# Patient Record
Sex: Male | Born: 1945
Health system: Southern US, Community
[De-identification: ages and names within clinical notes are randomized; demographics above are authoritative.]

## PROBLEM LIST (undated history)

## (undated) DIAGNOSIS — M3212 Pericarditis in systemic lupus erythematosus: Secondary | ICD-10-CM

## (undated) DIAGNOSIS — R0602 Shortness of breath: Secondary | ICD-10-CM

## (undated) DIAGNOSIS — G44229 Chronic tension-type headache, not intractable: Secondary | ICD-10-CM

## (undated) DIAGNOSIS — M329 Systemic lupus erythematosus, unspecified: Secondary | ICD-10-CM

## (undated) DIAGNOSIS — IMO0002 Reserved for concepts with insufficient information to code with codable children: Secondary | ICD-10-CM

## (undated) DIAGNOSIS — F32A Depression, unspecified: Secondary | ICD-10-CM

## (undated) DIAGNOSIS — I499 Cardiac arrhythmia, unspecified: Secondary | ICD-10-CM

## (undated) DIAGNOSIS — E785 Hyperlipidemia, unspecified: Secondary | ICD-10-CM

## (undated) DIAGNOSIS — F419 Anxiety disorder, unspecified: Secondary | ICD-10-CM

## (undated) DIAGNOSIS — I219 Acute myocardial infarction, unspecified: Secondary | ICD-10-CM

## (undated) DIAGNOSIS — F329 Major depressive disorder, single episode, unspecified: Secondary | ICD-10-CM

## (undated) DIAGNOSIS — I10 Essential (primary) hypertension: Secondary | ICD-10-CM

## (undated) DIAGNOSIS — K635 Polyp of colon: Secondary | ICD-10-CM

## (undated) DIAGNOSIS — I2510 Atherosclerotic heart disease of native coronary artery without angina pectoris: Secondary | ICD-10-CM

## (undated) DIAGNOSIS — I509 Heart failure, unspecified: Secondary | ICD-10-CM

## (undated) DIAGNOSIS — M3213 Lung involvement in systemic lupus erythematosus: Secondary | ICD-10-CM

## (undated) HISTORY — DX: Polyp of colon: K63.5

## (undated) HISTORY — PX: CARDIAC CATHETERIZATION: SHX172

## (undated) HISTORY — DX: Anxiety disorder, unspecified: F41.9

## (undated) HISTORY — DX: Major depressive disorder, single episode, unspecified: F32.9

## (undated) HISTORY — DX: Depression, unspecified: F32.A

## (undated) HISTORY — DX: Hyperlipidemia, unspecified: E78.5

## (undated) HISTORY — DX: Chronic tension-type headache, not intractable: G44.229

## (undated) NOTE — *Deleted (*Deleted)
Medical screening examination/treatment/procedure(s) were conducted as a shared visit with non-physician practitioner(s) and myself.  I personally evaluated the patient during the encounter  

---

## 2006-06-04 DIAGNOSIS — I219 Acute myocardial infarction, unspecified: Secondary | ICD-10-CM

## 2006-06-04 HISTORY — DX: Acute myocardial infarction, unspecified: I21.9

## 2007-06-05 HISTORY — PX: OTHER SURGICAL HISTORY: SHX169

## 2013-02-03 ENCOUNTER — Inpatient Hospital Stay (HOSPITAL_COMMUNITY)
Admission: EM | Admit: 2013-02-03 | Discharge: 2013-02-05 | DRG: 314 | Disposition: A | Discharge status: Home / Self Care | Payer: Medicare Other | Attending: Internal Medicine | Admitting: Internal Medicine

## 2013-02-03 ENCOUNTER — Encounter (HOSPITAL_COMMUNITY): Payer: Self-pay | Admitting: Emergency Medicine

## 2013-02-03 ENCOUNTER — Emergency Department (HOSPITAL_COMMUNITY): Payer: Medicare Other

## 2013-02-03 DIAGNOSIS — J8410 Pulmonary fibrosis, unspecified: Secondary | ICD-10-CM | POA: Diagnosis present

## 2013-02-03 DIAGNOSIS — J849 Interstitial pulmonary disease, unspecified: Secondary | ICD-10-CM

## 2013-02-03 DIAGNOSIS — R9431 Abnormal electrocardiogram [ECG] [EKG]: Secondary | ICD-10-CM

## 2013-02-03 DIAGNOSIS — I10 Essential (primary) hypertension: Secondary | ICD-10-CM | POA: Diagnosis present

## 2013-02-03 DIAGNOSIS — A319 Mycobacterial infection, unspecified: Secondary | ICD-10-CM | POA: Diagnosis present

## 2013-02-03 DIAGNOSIS — M3213 Lung involvement in systemic lupus erythematosus: Secondary | ICD-10-CM | POA: Diagnosis present

## 2013-02-03 DIAGNOSIS — I309 Acute pericarditis, unspecified: Principal | ICD-10-CM | POA: Diagnosis present

## 2013-02-03 DIAGNOSIS — I2510 Atherosclerotic heart disease of native coronary artery without angina pectoris: Secondary | ICD-10-CM | POA: Diagnosis present

## 2013-02-03 DIAGNOSIS — R079 Chest pain, unspecified: Secondary | ICD-10-CM

## 2013-02-03 DIAGNOSIS — J189 Pneumonia, unspecified organism: Secondary | ICD-10-CM | POA: Diagnosis present

## 2013-02-03 DIAGNOSIS — Z87891 Personal history of nicotine dependence: Secondary | ICD-10-CM

## 2013-02-03 DIAGNOSIS — E785 Hyperlipidemia, unspecified: Secondary | ICD-10-CM | POA: Diagnosis present

## 2013-02-03 DIAGNOSIS — M3212 Pericarditis in systemic lupus erythematosus: Secondary | ICD-10-CM | POA: Diagnosis present

## 2013-02-03 DIAGNOSIS — I4891 Unspecified atrial fibrillation: Secondary | ICD-10-CM

## 2013-02-03 DIAGNOSIS — J99 Respiratory disorders in diseases classified elsewhere: Secondary | ICD-10-CM | POA: Diagnosis present

## 2013-02-03 DIAGNOSIS — M329 Systemic lupus erythematosus, unspecified: Secondary | ICD-10-CM | POA: Diagnosis present

## 2013-02-03 DIAGNOSIS — I252 Old myocardial infarction: Secondary | ICD-10-CM

## 2013-02-03 DIAGNOSIS — Z79899 Other long term (current) drug therapy: Secondary | ICD-10-CM

## 2013-02-03 HISTORY — DX: Reserved for concepts with insufficient information to code with codable children: IMO0002

## 2013-02-03 HISTORY — DX: Hyperlipidemia, unspecified: E78.5

## 2013-02-03 HISTORY — DX: Lung involvement in systemic lupus erythematosus: M32.13

## 2013-02-03 HISTORY — DX: Pericarditis in systemic lupus erythematosus: M32.12

## 2013-02-03 HISTORY — DX: Acute myocardial infarction, unspecified: I21.9

## 2013-02-03 HISTORY — DX: Essential (primary) hypertension: I10

## 2013-02-03 HISTORY — DX: Cardiac arrhythmia, unspecified: I49.9

## 2013-02-03 HISTORY — DX: Atherosclerotic heart disease of native coronary artery without angina pectoris: I25.10

## 2013-02-03 HISTORY — DX: Shortness of breath: R06.02

## 2013-02-03 HISTORY — DX: Systemic lupus erythematosus, unspecified: M32.9

## 2013-02-03 LAB — CBC
HCT: 44.3 % (ref 39.0–52.0)
Hemoglobin: 15.2 g/dL (ref 13.0–17.0)
MCH: 31.5 pg (ref 26.0–34.0)
MCHC: 34.3 g/dL (ref 30.0–36.0)
RBC: 4.82 MIL/uL (ref 4.22–5.81)

## 2013-02-03 LAB — BASIC METABOLIC PANEL
BUN: 9 mg/dL (ref 6–23)
CO2: 20 mEq/L (ref 19–32)
GFR calc non Af Amer: 87 mL/min — ABNORMAL LOW (ref >90)
Glucose, Bld: 109 mg/dL — ABNORMAL HIGH (ref 70–99)
Potassium: 4 mEq/L (ref 3.5–5.1)
Sodium: 141 mEq/L (ref 135–145)

## 2013-02-03 LAB — POCT I-STAT TROPONIN I: Troponin i, poc: 0.03 ng/mL (ref 0.00–0.08)

## 2013-02-03 MED ORDER — ASPIRIN 81 MG PO CHEW
324.0000 mg | CHEWABLE_TABLET | Freq: Once | ORAL | Status: AC
  Administered 2013-02-04: 324 mg via ORAL
  Filled 2013-02-03: qty 4

## 2013-02-03 MED ORDER — NITROGLYCERIN 2 % TD OINT: 1.0000 [in_us] | TOPICAL_OINTMENT | Freq: Once | TRANSDERMAL | Status: DC

## 2013-02-03 MED ORDER — NITROGLYCERIN 0.4 MG SL SUBL: 0.4000 mg | SUBLINGUAL_TABLET | SUBLINGUAL | Status: DC | PRN

## 2013-02-03 MED ORDER — DEXTROSE 5 % IV SOLN
1.0000 g | Freq: Once | INTRAVENOUS | Status: AC
  Administered 2013-02-04: 1 g via INTRAVENOUS
  Filled 2013-02-03: qty 10

## 2013-02-03 MED ORDER — DEXTROSE 5 % IV SOLN
500.0000 mg | Freq: Once | INTRAVENOUS | Status: AC
  Administered 2013-02-04: 500 mg via INTRAVENOUS
  Filled 2013-02-03: qty 500

## 2013-02-03 NOTE — ED Notes (Signed)
The pt  Has had upper lt chest pain for 3-4 days with sob with movement.  He had stents placed in feb and a heart attack 6 or 7 years ago.  No chest pain at present.  nsr on the monitor

## 2013-02-03 NOTE — ED Provider Notes (Signed)
CSN: [id number]     Arrival date & time 02/03/13  2053 History   First MD Initiated Contact with Patient 02/03/13 2218     Chief Complaint  Patient presents with  . Chest Pain   (Consider location/radiation/quality/duration/timing/severity/associated sxs/prior Treatment) Patient is a 67 y.o. male presenting with chest pain. The history is provided by the patient.  Chest Pain Pain location:  Substernal area Pain quality: aching and sharp   Pain radiates to:  Does not radiate Pain severity:  Moderate Onset quality:  Gradual Duration:  3 days Timing:  Constant Progression:  Worsening Chronicity:  New Ineffective treatments:  None tried Associated symptoms: cough and shortness of breath   Associated symptoms: no abdominal pain, no back pain, no dysphagia, no fever, no headache, no nausea, no numbness and not vomiting   Risk factors: coronary artery disease   Risk factors comment:  Lupus    Past Medical History  Diagnosis Date  . Coronary artery disease   . Lupus    Past Surgical History  Procedure Laterality Date  . Coronary stents     No family history on file. History  Substance Use Topics  . Smoking status: Never Smoker   . Smokeless tobacco: Not on file  . Alcohol Use: No    Review of Systems  Constitutional: Negative for fever and chills.  HENT: Negative for trouble swallowing, neck pain and neck stiffness.   Respiratory: Positive for cough and shortness of breath.   Cardiovascular: Positive for chest pain.  Gastrointestinal: Negative for nausea, vomiting, abdominal pain and diarrhea.  Endocrine: Negative for polydipsia and polyphagia.  Genitourinary: Negative for dysuria and frequency.  Musculoskeletal: Negative for back pain.  Skin: Negative for rash.  Neurological: Negative for numbness and headaches.  Hematological: Negative for adenopathy. Does not bruise/bleed easily.  All other systems reviewed and are negative.    Allergies  Review of patient's  allergies indicates no known allergies.  Home Medications   Current Outpatient Rx  Name  Route  Sig  Dispense  Refill  . clopidogrel (PLAVIX) 75 MG tablet   Oral   Take 75 mg by mouth daily.         [name] guaiFENesin (MUCINEX) 600 MG 12 hr tablet   Oral   Take 1,200 mg by mouth 2 (two) times daily as needed for congestion.         . hydroxychloroquine (PLAQUENIL) 200 MG tablet   Oral   Take 200 mg by mouth at bedtime.         [name] lisinopril (PRINIVIL,ZESTRIL) 2.5 MG tablet   Oral   Take 2.5 mg by mouth daily.         [name] lovastatin (MEVACOR) 20 MG tablet   Oral   Take 20 mg by mouth at bedtime.         . metoprolol tartrate (LOPRESSOR) 25 MG tablet   Oral   Take 25 mg by mouth 2 (two) times daily.          BP 97/54  Pulse 77  Temp(Src) 98.1 F (36.7 C) (Oral)  Resp 20  SpO2 94% Physical Exam  Vitals reviewed. Constitutional: He is oriented to person, place, and time. He appears well-developed and well-nourished. No distress.  HENT:  Head: Normocephalic.  Right Ear: External ear normal.  Left Ear: External ear normal.  Nose: Nose normal.  Mouth/Throat: Oropharynx is clear and moist. No oropharyngeal exudate.  Eyes: Conjunctivae and EOM are normal.  Neck: Normal range of motion.  Neck supple. No JVD present.  Cardiovascular: Normal rate, regular rhythm, normal heart sounds and intact distal pulses.  Exam reveals no gallop and no friction rub.   No murmur heard. Pulmonary/Chest: Effort normal. He has rales (in the bases).  Abdominal: Soft. Bowel sounds are normal. He exhibits no distension. There is no tenderness.  Musculoskeletal: Normal range of motion. He exhibits no edema and no tenderness.  Neurological: He is alert and oriented to person, place, and time.  Skin: Skin is warm and dry. He is not diaphoretic.  Psychiatric: He has a normal mood and affect.    ED Course  Procedures (including critical care time) Labs Review Labs Reviewed  CBC - Abnormal;  Notable for the following:    WBC 11.1 (*)    All other components within normal limits  BASIC METABOLIC PANEL - Abnormal; Notable for the following:    Glucose, Bld 109 (*)    GFR calc non Af Amer 87 (*)    All other components within normal limits  PRO B NATRIURETIC PEPTIDE - Abnormal; Notable for the following:    Pro B Natriuretic peptide (BNP) 971.3 (*)    All other components within normal limits  TROPONIN I  POCT I-STAT TROPONIN I   Imaging Review Dg Chest 2 View  02/03/2013   *RADIOLOGY REPORT*  Clinical Data: Chest pain for 3 days.  CHEST - 2 VIEW  Comparison: None.  Findings: Moderate convex right thoracic spine curvature. Midline trachea.  Mild cardiomegaly with atherosclerosis in the transverse aorta.  No right and no definite left pleural effusion. No pneumothorax.  Left greater than right patchy bibasilar airspace disease.  Concurrent mild volume loss, especially at the left lung base.  IMPRESSION: Left greater than right bibasilar airspace disease.  Especially on the left, suspicious for infection.  Atelectasis with volume loss felt less likely. Recommend radiographic follow-up until clearing.  Cardiomegaly without congestive failure.   Original Report Authenticated By: Jeronimo Greaves, M.D.    Date: 02/03/2013  Rate: 139  Rhythm: atrial fibrillation  QRS Axis: normal  Intervals: QT prolonged  ST/T Wave abnormalities: nonspecific T wave changes  Conduction Disutrbances:none  Narrative Interpretation:   Old EKG Reviewed: none available   Date: 02/03/2013  Rate: 75  Rhythm: normal sinus rhythm  QRS Axis: left  Intervals: normal  ST/T Wave abnormalities: TWI in III, aVF, V3; STE in I and aVL; STD in III and aVF  Conduction Disutrbances:none  Narrative Interpretation:   Old EKG Reviewed: NSR has replaced afib, STE and STD now present as documented      MDM   3 y M with PMH of Lupus and CAD here with chest pain x 3days that he feels is the same pain he had previosly  when he had a pericardial effusion.  He reports a recent history of being hospitalized for his lupus with pleural and pericardial effusions in February.  He finished a steroid taper approximately one month ago.  He is here with constant left sided chest pain x 3days, non-radiating, improved with certain positions.  He also reports a productive cough and sweats.  No nausea, vomiting, diarrhea, abd pain.  He was initially tachycardic, EKG with afib RVR.  On my arrival at the bedside he is in a regular rhythm.  Lungs with crackles in the bases.  No edema or JVD.  Abd soft, NT.  No murmurs.  Bedside U/S with small pericardial effusion.  CXR from triage concerning for PNA.  bnp 971.  Cr normal.  Will tx for PNA.  Pt is on plaquenil, but no other HCAP risk fx so will tx for Rocephin/Azithro.  Pt will likely need Medicine admission.  WIll repeat EKG.  11:47 PM Repeat EKG now that the patient is in NSR is concerning with STE in I and aVL and STD in III and aVF.  ST segments are concave upwards.  Trop from triage negative.  Pain is positional.  Likely pericarditis picture.  Cardiology was consulted to discuss the case and agrees that the cath lab should be activated given his EKG changes.  324 mg ASA given.  SL nitro.  Given his effusion, will not administer heparin.  2:45 AM Cath lab activation was later d/c'd.  Pt admitted to Internal Medicine for further management.    Clinical Impression: 1. Chest pain   2. Community acquired pneumonia   3. ST elevation   4. Acute pericarditis   5. Atrial fibrillation   6.  Pericardial effusion  Disposition: Admit  Condition: Fair   I have discussed the results, Dx and Tx plan. They understand and agree with plan for admission.  Exam unchanged at admission.   Pt seen in conjunction with Dr. Wilkie Aye.  Reine Just. Beverely Pace, MD Emergency Medicine PGY-III [phone]   Oleh Genin, MD 02/04/13 [phone]

## 2013-02-03 NOTE — ED Notes (Signed)
[place]  All [place] records are there

## 2013-02-03 NOTE — ED Notes (Addendum)
Pt.reports mid chest pain for several days with SOB , dry cough and diaphoresis. Pt.stated history of CAD and coronary stents his cardiologist is at [facility] .

## 2013-02-03 NOTE — ED Notes (Signed)
The pt reports that his bp jumps around.  He takes bp med and is usually on the lower side

## 2013-02-03 NOTE — ED Notes (Signed)
Ed res at the bedside 

## 2013-02-04 ENCOUNTER — Inpatient Hospital Stay (HOSPITAL_COMMUNITY): Payer: Medicare Other

## 2013-02-04 ENCOUNTER — Encounter (HOSPITAL_COMMUNITY): Payer: Self-pay | Admitting: Cardiology

## 2013-02-04 DIAGNOSIS — M3213 Lung involvement in systemic lupus erythematosus: Secondary | ICD-10-CM | POA: Diagnosis present

## 2013-02-04 DIAGNOSIS — J99 Respiratory disorders in diseases classified elsewhere: Secondary | ICD-10-CM

## 2013-02-04 DIAGNOSIS — J8410 Pulmonary fibrosis, unspecified: Secondary | ICD-10-CM

## 2013-02-04 DIAGNOSIS — M329 Systemic lupus erythematosus, unspecified: Secondary | ICD-10-CM

## 2013-02-04 DIAGNOSIS — M3212 Pericarditis in systemic lupus erythematosus: Secondary | ICD-10-CM | POA: Diagnosis present

## 2013-02-04 DIAGNOSIS — I309 Acute pericarditis, unspecified: Secondary | ICD-10-CM | POA: Diagnosis present

## 2013-02-04 DIAGNOSIS — R079 Chest pain, unspecified: Secondary | ICD-10-CM

## 2013-02-04 DIAGNOSIS — J849 Interstitial pulmonary disease, unspecified: Secondary | ICD-10-CM | POA: Diagnosis present

## 2013-02-04 DIAGNOSIS — J189 Pneumonia, unspecified organism: Secondary | ICD-10-CM | POA: Diagnosis present

## 2013-02-04 LAB — BASIC METABOLIC PANEL
CO2: 23 mEq/L (ref 19–32)
Calcium: 8.5 mg/dL (ref 8.4–10.5)
Creatinine, Ser: 0.89 mg/dL (ref 0.50–1.35)
GFR calc non Af Amer: 87 mL/min — ABNORMAL LOW (ref >90)
Glucose, Bld: 98 mg/dL (ref 70–99)
Sodium: 137 mEq/L (ref 135–145)

## 2013-02-04 LAB — URINALYSIS, ROUTINE W REFLEX MICROSCOPIC
Glucose, UA: NEGATIVE mg/dL
Protein, ur: 30 mg/dL — AB
Specific Gravity, Urine: 1.024 (ref 1.005–1.030)

## 2013-02-04 LAB — CBC
Hemoglobin: 14.1 g/dL (ref 13.0–17.0)
MCH: 31.1 pg (ref 26.0–34.0)
MCHC: 33.7 g/dL (ref 30.0–36.0)
MCV: 92.1 fL (ref 78.0–100.0)
Platelets: 257 10*3/uL (ref 150–400)
RBC: 4.54 MIL/uL (ref 4.22–5.81)

## 2013-02-04 LAB — URINE MICROSCOPIC-ADD ON

## 2013-02-04 LAB — C4 COMPLEMENT: Complement C4, Body Fluid: 22 mg/dL (ref 10–40)

## 2013-02-04 LAB — SEDIMENTATION RATE: Sed Rate: 44 mm/hr — ABNORMAL HIGH (ref 0–16)

## 2013-02-04 LAB — PRO B NATRIURETIC PEPTIDE: Pro B Natriuretic peptide (BNP): 772.3 pg/mL — ABNORMAL HIGH (ref 0–125)

## 2013-02-04 MED ORDER — HEPARIN SODIUM (PORCINE) 5000 UNIT/ML IJ SOLN: 5000.0000 [IU] | Freq: Three times a day (TID) | INTRAMUSCULAR | Status: DC

## 2013-02-04 MED ORDER — PREDNISONE 50 MG PO TABS
60.0000 mg | ORAL_TABLET | Freq: Every day | ORAL | Status: DC
  Administered 2013-02-04 – 2013-02-05 (×2): 60 mg via ORAL
  Filled 2013-02-04 (×3): qty 1

## 2013-02-04 MED ORDER — LISINOPRIL 2.5 MG PO TABS
2.5000 mg | ORAL_TABLET | Freq: Every day | ORAL | Status: DC
  Administered 2013-02-04 – 2013-02-05 (×2): 2.5 mg via ORAL
  Filled 2013-02-04 (×2): qty 1

## 2013-02-04 MED ORDER — IBUPROFEN 600 MG PO TABS
600.0000 mg | ORAL_TABLET | Freq: Three times a day (TID) | ORAL | Status: DC
  Administered 2013-02-04 – 2013-02-05 (×4): 600 mg via ORAL
  Filled 2013-02-04 (×6): qty 1

## 2013-02-04 MED ORDER — SIMVASTATIN 10 MG PO TABS
10.0000 mg | ORAL_TABLET | Freq: Every day | ORAL | Status: DC
  Administered 2013-02-04: 10 mg via ORAL
  Filled 2013-02-04 (×2): qty 1

## 2013-02-04 MED ORDER — CLOPIDOGREL BISULFATE 75 MG PO TABS
75.0000 mg | ORAL_TABLET | Freq: Every day | ORAL | Status: DC
  Administered 2013-02-04 – 2013-02-05 (×2): 75 mg via ORAL
  Filled 2013-02-04 (×2): qty 1

## 2013-02-04 MED ORDER — METOPROLOL TARTRATE 25 MG PO TABS
25.0000 mg | ORAL_TABLET | Freq: Two times a day (BID) | ORAL | Status: DC
  Administered 2013-02-04 – 2013-02-05 (×3): 25 mg via ORAL
  Filled 2013-02-04 (×4): qty 1

## 2013-02-04 MED ORDER — GUAIFENESIN ER 600 MG PO TB12
1200.0000 mg | ORAL_TABLET | Freq: Two times a day (BID) | ORAL | Status: DC | PRN
  Filled 2013-02-04: qty 2

## 2013-02-04 MED ORDER — SODIUM CHLORIDE 0.9 % IV SOLN
INTRAVENOUS | Status: DC
  Administered 2013-02-04: 04:00:00 via INTRAVENOUS

## 2013-02-04 MED ORDER — HYDROXYCHLOROQUINE SULFATE 200 MG PO TABS
200.0000 mg | ORAL_TABLET | Freq: Every day | ORAL | Status: DC
  Administered 2013-02-04: 200 mg via ORAL
  Filled 2013-02-04 (×2): qty 1

## 2013-02-04 MED ORDER — SODIUM CHLORIDE 0.9 % IJ SOLN
3.0000 mL | Freq: Two times a day (BID) | INTRAMUSCULAR | Status: DC
  Administered 2013-02-04: 3 mL via INTRAVENOUS

## 2013-02-04 NOTE — ED Provider Notes (Signed)
I saw and evaluated the patient, reviewed the resident's note and I agree with the findings and plan.  This is a 67 year old male with a history of coronary artery disease, lupus, pericarditis who presents with 3 days of chest pain. Patient just moved screened for a has not had any of his care here. Patient reports 3 days of positional chest pain. He states his chest pain is sharp and gets worse when he lays flat. It is somewhat better when he leans forward. He denies any fevers. He does have a productive cough. Patient states that he was hospitalized in February for pericarditis. At that time he describes having a pericardial effusion and a pleural effusion.    Initial EKG showed patient was in atrial fibrillation. There is no evidence of ST elevation or ischemia. Patient was going to rate as 139. He spontaneously converted. Repeat EKGs showed ST elevations in 1 and aVL depressions in the inferior leads. We have no old EKG. By the time of the second EKG, he had a negative troponin. Given his EKG changes, a Code STEMI was initiated.    Patient was evaluated by cardiology. They feel his EKG changes and presentation is consistent with pericarditis.   Patient was given Rocephin and azithromycin given his x-ray findings.  Cardiology gave recommendations regarding pericarditis. Patient will be admitted to the hospitalist service.  Shon Baton, MD 02/04/13 [phone]

## 2013-02-04 NOTE — ED Notes (Signed)
zithromax almost infused

## 2013-02-04 NOTE — Consult Note (Addendum)
Admit date: 02/03/2013 Referring Physician  Dr. Beverely Pace Primary Physician  None Primary Cardiologist  NOne Reason for Consultation  Chest pain/pericarditis  HPI: This is a 67yo AAM with a remote history of MI in 2008 at which time he got 2 stents and then repeat cath in 2009 with PCI x1, and recent hospitalization in February in [place] for acute pericarditis as a complication of lupus and PNA.  He CP at that time was sharp and was treated with steroids.  He has been in his USOH when he started have SOB, pleuritic CP and fevers and significant night sweats.  This has been going on for 3 days.  The CP has been constant since then and is identical to his CP with hie pericarditis.  The CP is not like his CAD at all.  He has also had a cough productive of clear phlegm.  He was noted in ER to be in atrial fibrillation which resolved spontaneously to NSR with ST elevation in I and aVL with PR depression and T wave inversions in III, aVF and V3-V4.  He was noted on chest xray to have airspace disease suspicious for PNA.  Cardiology was consulted to evaluate abnormal EKG and possible pericarditis.  He has had constant CP for 3 days with normal troponin.     PMH:   Past Medical History  Diagnosis Date  . Lupus   . Coronary artery disease 2008/2009    MI with PCI x 2, then PCI x 1 in 2009  . Myocardial infarction 2008  . Pericarditis   . Dyslipidemia   . Hypertension   . Dysrhythmia     atrial fibrillation     PSH:   Past Surgical History  Procedure Laterality Date  . Coronary stents    . Cardiac catheterization      Allergies:  Review of patient's allergies indicates no known allergies. Prior to Admit Meds:   (Not in a hospital admission) Fam HX:   No family history on file. Social HX:    History   Social History  . Marital Status: Married    Spouse Name: N/A    Number of Children: N/A  . Years of Education: N/A   Occupational History  . Not on file.   Social History Main Topics  .  Smoking status: Former Games developer  . Smokeless tobacco: Not on file  . Alcohol Use: No  . Drug Use: Not on file  . Sexual Activity: Not on file   Other Topics Concern  . Not on file   Social History Narrative  . No narrative on file     ROS:  All 11 ROS were addressed and are negative except what is stated in the HPI  Physical Exam: Blood pressure 110/72, pulse 72, temperature 98.1 F (36.7 C), temperature source Oral, resp. rate 18, SpO2 92.00%.    General: Well developed, well nourished, in no acute distress Head: Eyes PERRLA, No xanthomas.   Normal cephalic and atramatic  Lungs:   Clear bilaterally to auscultation and percussion. Heart:   HRRR S1 S2 Pulses are 2+ & equal. ? Faint pericardia friction rub            No carotid bruit. No JVD.  No abdominal bruits. No femoral bruits. Abdomen: Bowel sounds are positive, abdomen soft and non-tender without masses  Extremities:   No clubbing, cyanosis or edema.  DP +1 Neuro: Alert and oriented X 3. Psych:  Good affect, responds appropriately    Labs:  Lab Results  Component Value Date   WBC 11.1* 02/03/2013   HGB 15.2 02/03/2013   HCT 44.3 02/03/2013   MCV 91.9 02/03/2013   PLT 266 02/03/2013    Recent Labs Lab 02/03/13 2116  NA 141  K 4.0  CL 108  CO2 20  BUN 9  CREATININE 0.89  CALCIUM 8.7  GLUCOSE 109*   No results found for this basename: PTT   No results found for this basename: INR, PROTIME   Lab Results  Component Value Date   TROPONINI <0.30 02/03/2013         Radiology:  Dg Chest 2 View  02/03/2013   *RADIOLOGY REPORT*  Clinical Data: Chest pain for 3 days.  CHEST - 2 VIEW  Comparison: None.  Findings: Moderate convex right thoracic spine curvature. Midline trachea.  Mild cardiomegaly with atherosclerosis in the transverse aorta.  No right and no definite left pleural effusion. No pneumothorax.  Left greater than right patchy bibasilar airspace disease.  Concurrent mild volume loss, especially at the left lung  base.  IMPRESSION: Left greater than right bibasilar airspace disease.  Especially on the left, suspicious for infection.  Atelectasis with volume loss felt less likely. Recommend radiographic follow-up until clearing.  Cardiomegaly without congestive failure.   Original Report Authenticated By: Jeronimo Greaves, M.D.    EKG:  #1Atrial fibrillation with nonspecific T wave abnormality.  #2 NSR with 1mm of J point elevation in I and aVL and t wave inversions in III and aVF  ASSESSMENT:  1.  Acute pericarditis most likely secondary to acute pulmonary infection +/- lupus flare 2.  Probable PNA by chest xray and symptoms with elevated WBC 3.  Lupus 4.  CAD with remote PCI 5.  Elevated BNP but no chest xray findings of CHF - may be due to afib  PLAN:   1.  Check 2D echo in am 2.  Motrin 600mg  TID 3.  Treatment of PNA by Hospitalist service 4.  No anticoagulation for PAF since he is back in NSR and has acute pericarditis 5.  Continue cardiac meds  Quintella Reichert, MD  02/04/2013  12:28 AM

## 2013-02-04 NOTE — ED Notes (Signed)
Admitting doctor in to see 

## 2013-02-04 NOTE — Progress Notes (Signed)
  Echocardiogram 2D Echocardiogram has been performed.  Dorothey Baseman 02/04/2013, 1:28 PM

## 2013-02-04 NOTE — ED Provider Notes (Signed)
I saw and evaluated the patient, reviewed the resident's note and I agree with the findings and plan.  Please see my note.  Shon Baton, MD 02/04/13 [phone]

## 2013-02-04 NOTE — ED Notes (Signed)
The pt has finished his zithromax.  Sleeping intermittently.  Still waiting for orders or a doctor to see.  Non chest pain.

## 2013-02-04 NOTE — Progress Notes (Signed)
Admit date: 02/03/2013 Referring Physician  Dr. Beverely Pace Primary Physician  None Primary Cardiologist  NOne Reason for Consultation  Chest pain/pericarditis  HPI: This is a 67yo AAM with a remote history of MI in 2008 at which time he got 2 stents and then repeat cath in 2009 with PCI x1, and recent hospitalization in February in [place] for acute pericarditis as a complication of lupus and PNA.  He CP at that time was sharp and was treated with steroids.  He has been in his USOH when he started have SOB, pleuritic CP and fevers and significant night sweats.  This has been going on for 3 days.  The CP has been constant since then and is identical to his CP with hie pericarditis.  The CP is not like his CAD at all.  He has also had a cough productive of clear phlegm.  He was noted in ER to be in atrial fibrillation which resolved spontaneously to NSR with ST elevation in I and aVL with PR depression and T wave inversions in III, aVF and V3-V4.  He was noted on chest xray to have airspace disease suspicious for PNA.  Cardiology was consulted to evaluate abnormal EKG and possible pericarditis.  He has had constant CP for 3 days with normal troponin.  His pain is pleuretic and positional.  Not at all similar to his angina pain    PMH:   Past Medical History  Diagnosis Date  . Lupus   . Coronary artery disease 2008/2009    MI with PCI x 2, then PCI x 1 in 2009  . Myocardial infarction 2008  . Dyslipidemia   . Hypertension   . Dysrhythmia     atrial fibrillation  . Lupus pericarditis   . Lupus disease of the lung      PSH:   Past Surgical History  Procedure Laterality Date  . Coronary stents    . Cardiac catheterization      Allergies:  Review of patient's allergies indicates no known allergies. Prior to Admit Meds:   Prescriptions prior to admission  Medication Sig Dispense Refill  . clopidogrel (PLAVIX) 75 MG tablet Take 75 mg by mouth daily.      [name] guaiFENesin (MUCINEX) 600 MG 12 hr  tablet Take 1,200 mg by mouth 2 (two) times daily as needed for congestion.      . hydroxychloroquine (PLAQUENIL) 200 MG tablet Take 200 mg by mouth at bedtime.      [name] lisinopril (PRINIVIL,ZESTRIL) 2.5 MG tablet Take 2.5 mg by mouth daily.      [name] lovastatin (MEVACOR) 20 MG tablet Take 20 mg by mouth at bedtime.      . metoprolol tartrate (LOPRESSOR) 25 MG tablet Take 25 mg by mouth 2 (two) times daily.       Fam HX:   No family history on file. Social HX:    History   Social History  . Marital Status: Married    Spouse Name: N/A    Number of Children: N/A  . Years of Education: N/A   Occupational History  . Not on file.   Social History Main Topics  . Smoking status: Former Games developer  . Smokeless tobacco: Not on file  . Alcohol Use: No  . Drug Use: Not on file  . Sexual Activity: Not on file   Other Topics Concern  . Not on file   Social History Narrative  . No narrative on file      Physical  Exam: Blood pressure 130/70, pulse 70, temperature 98 F (36.7 C), temperature source Oral, resp. rate 20, height 6' (1.829 m), weight 201 lb (91.173 kg), SpO2 97.00%.    General: Well developed, well nourished, in no acute distress Head: Eyes PERRLA, No xanthomas.   Normal cephalic and atramatic  Lungs:   Clear bilaterally to auscultation and percussion. Heart:   HRRR S1 S2 Pulses are 2+ & equal. ? Faint pericardia friction rub            No carotid bruit. No JVD.  No abdominal bruits. No femoral bruits. Abdomen: Bowel sounds are positive, abdomen soft and non-tender without masses  Extremities:   No clubbing, cyanosis or edema.  DP +1 Neuro: Alert and oriented X 3. Psych:  Good affect, responds appropriately    Labs:   Lab Results  Component Value Date   WBC 9.8 02/04/2013   HGB 14.1 02/04/2013   HCT 41.8 02/04/2013   MCV 92.1 02/04/2013   PLT 257 02/04/2013     Recent Labs Lab 02/04/13 0448  NA 137  K 3.7  CL 104  CO2 23  BUN 8  CREATININE 0.89  CALCIUM 8.5  GLUCOSE  98   No results found for this basename: PTT   No results found for this basename: INR,  PROTIME   Lab Results  Component Value Date   TROPONINI <0.30 02/03/2013         Radiology:  Dg Chest 2 View  02/03/2013   *RADIOLOGY REPORT*  Clinical Data: Chest pain for 3 days.  CHEST - 2 VIEW  Comparison: None.  Findings: Moderate convex right thoracic spine curvature. Midline trachea.  Mild cardiomegaly with atherosclerosis in the transverse aorta.  No right and no definite left pleural effusion. No pneumothorax.  Left greater than right patchy bibasilar airspace disease.  Concurrent mild volume loss, especially at the left lung base.  IMPRESSION: Left greater than right bibasilar airspace disease.  Especially on the left, suspicious for infection.  Atelectasis with volume loss felt less likely. Recommend radiographic follow-up until clearing.  Cardiomegaly without congestive failure.   Original Report Authenticated By: Jeronimo Greaves, M.D.    EKG:  #1Atrial fibrillation with nonspecific T wave abnormality.  #2 NSR with 1mm of J point elevation in I and aVL and t wave inversions in III and aVF  ASSESSMENT:  1.  Acute pericarditis most likely secondary to acute pulmonary infection +/- lupus flare 2.  Probable PNA by chest xray and symptoms with elevated WBC 3.  Lupus 4.  CAD with remote PCI 5.  Elevated BNP but no chest xray findings of CHF - may be due to afib  PLAN:   1.  2D echo today 2.  Motrin 600mg  TID 3.  Treatment of PNA by Hospitalist service 4.  No anticoagulation for PAF  At this point. 5.  Continue cardiac meds  Elyn Aquas., MD  02/04/2013  7:44 AM

## 2013-02-04 NOTE — Progress Notes (Signed)
[facility] PROGRESS NOTE  Cody Arellano ZOX:[id number] DOB: Feb 24, 1946 DOA: 02/03/2013 PCP: No primary provider on file.  HPI/Subjective: Denies chest pain or SOB  Assessment/Plan:  Acute pericarditis -Likely secondary to his lupus, patient started on prednisone, Plaquenil and Motrin. -2-D echocardiogram pending, x-ray showed possible effusion less likely to be tamponade. -Cardiology is following.  Atrial fibrillation -Paroxysmal atrial fibrillation, diagnosed last February. -Patient is on Plavix, rate is controlled.   Lupus -With a suspected flare up, check ESR, CRP are high. -Started on prednisone, continued Plaquenil.  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 -Suspected pneumonia, patient started on Rocephin and azithromycin.  Code Status: Full code Family Communication: Plan discussed with the patient. Disposition Plan: Remains inpatient   Consultants:  Cardiology   PCCM  Procedures:  None  Antibiotics:  Rocephin and Azithromycin   Objective: Filed Vitals:   02/04/13 0401  BP: 130/70  Pulse: 70  Temp: 98 F (36.7 C)  Resp: 20    Intake/Output Summary (Last 24 hours) at 02/04/13 1150 Last data filed at 02/04/13 0900  Gross per 24 hour  Intake    240 ml  Output      0 ml  Net    240 ml   Filed Weights   02/04/13 0401  Weight: 91.173 kg (201 lb)    Exam: General: Alert and awake, oriented x3, not in any acute distress. HEENT: anicteric sclera, pupils reactive to light and accommodation, EOMI CVS: S1-S2 clear, no murmur rubs or gallops Chest: clear to auscultation bilaterally, no wheezing, rales or rhonchi Abdomen: soft nontender, nondistended, normal bowel sounds, no organomegaly Extremities: no cyanosis, clubbing or edema noted bilaterally Neuro: Cranial nerves II-XII intact, no focal neurological deficits  Data Reviewed: Basic Metabolic Panel:  Recent Labs Lab 02/03/13 2116 02/04/13 0448   NA 141 137  K 4.0 3.7  CL 108 104  CO2 20 23  GLUCOSE 109* 98  BUN 9 8  CREATININE 0.89 0.89  CALCIUM 8.7 8.5   Liver Function Tests: No results found for this basename: AST, ALT, ALKPHOS, BILITOT, PROT, ALBUMIN,  in the last 168 hours No results found for this basename: LIPASE, AMYLASE,  in the last 168 hours No results found for this basename: AMMONIA,  in the last 168 hours CBC:  Recent Labs Lab 02/03/13 2116 02/04/13 0400  WBC 11.1* 9.8  HGB 15.2 14.1  HCT 44.3 41.8  MCV 91.9 92.1  PLT 266 257   Cardiac Enzymes:  Recent Labs Lab 02/03/13 2340  TROPONINI <0.30   BNP (last 3 results)  Recent Labs  02/03/13 2116 02/04/13 0448  PROBNP 971.3* 772.3*   CBG: No results found for this basename: GLUCAP,  in the last 168 hours  Micro No results found for this or any previous visit (from the past 240 hour(s)).   Studies: Dg Chest 2 View  02/03/2013   *RADIOLOGY REPORT*  Clinical Data: Chest pain for 3 days.  CHEST - 2 VIEW  Comparison: None.  Findings: Moderate convex right thoracic spine curvature. Midline trachea.  Mild cardiomegaly with atherosclerosis in the transverse aorta.  No right and no definite left pleural effusion. No pneumothorax.  Left greater than right patchy bibasilar airspace disease.  Concurrent mild volume loss, especially at the left lung base.  IMPRESSION: Left greater than right bibasilar airspace disease.  Especially on the left, suspicious for infection.  Atelectasis with volume loss felt less likely. Recommend radiographic follow-up until clearing.  Cardiomegaly without congestive failure.  Original Report Authenticated By: Jeronimo Greaves, M.D.   Ct Chest Wo Contrast  02/04/2013   *RADIOLOGY REPORT*  Clinical Data: Chest pain, right greater than left.  Shortness of breath.  Possible interstitial lung disease.  CT CHEST WITHOUT CONTRAST  Technique:  Multidetector CT imaging of the chest was performed following the standard protocol without IV  contrast.  Comparison: Chest radiograph 02/03/2013.  Findings: No definite pathologically enlarged mediastinal or axillary lymph nodes.  Internal mammary lymph nodes are sub centimeter in size.  Difficult to definitively evaluate the hilar regions without IV contrast.  Coronary artery calcification.  Heart size at the upper limits of normal in size to mildly enlarged. Small pericardial effusion.  Patchy airspace consolidation is seen at the lung bases, primarily within both lower lobes.  Scattered cyst formation is seen in association.  Scattered pulmonary nodular densities measure up to 8 mm in the subpleural right middle lobe (image 34).  No definite traction bronchiectasis or honeycombing.  There may be minimal subpleural reticulation in the apical right upper lobe.  Tiny left pleural effusion.  Airway is unremarkable.  Incidental imaging of the upper abdomen shows no acute findings. No worrisome lytic or sclerotic lesions.  IMPRESSION:  1.  Bilateral lower lobe predominant air space consolidation with some areas of cystic change.  An acute infectious process is favored.  Difficult to assess for underlying interstitial lung disease. 2.  Scattered small pulmonary nodular densities, measuring up to 8 mm.  If the patient is at high risk for bronchogenic carcinoma, follow-up chest CT at 3-6 months is recommended.  If the patient is at low risk for bronchogenic carcinoma, follow-up chest CT at 6-12 months is recommended.  This recommendation follows the consensus statement: Guidelines for Management of Small Pulmonary Nodules Detected on CT Scans: A Statement from the Fleischner Society as published in Radiology 2005; 237:395-400. 3.  Tiny left pleural effusion and small pericardial effusion.   Original Report Authenticated By: Leanna Battles, M.D.    Scheduled Meds: . clopidogrel  75 mg Oral Daily  . hydroxychloroquine  200 mg Oral QHS  . ibuprofen  600 mg Oral TID  . lisinopril  2.5 mg Oral Daily  .  metoprolol tartrate  25 mg Oral BID  . predniSONE  60 mg Oral Q breakfast  . simvastatin  10 mg Oral q1800  . sodium chloride  3 mL Intravenous Q12H   Continuous Infusions: . sodium chloride 75 mL/hr at 02/04/13 0404    Active Problems:   Lupus disease of the lung   Lupus pericarditis   ILD (interstitial lung disease)   Acute pericarditis   CAP (community acquired pneumonia)    Time spent: 35 minutes    [facility]  [facility] Pager [phone] If 7PM-7AM, please contact night-coverage at [email or web address], password [facility] 02/04/2013, 11:50 AM  LOS: 1 day

## 2013-02-04 NOTE — H&P (Signed)
[facility] History and Physical  [name] ZOX:[id number] DOB: [birth year] DOA: 02/03/2013  Referring physician: ED PCP: No primary provider on file.   Chief Complaint: Chest pain  HPI: Cody Arellano is a 67 y.o. male with h/o lupus pericarditis and lupus lung disease (likely ILD not pneumonitis) both of which occurred earlier this year, who presents to the ED with c/o Chest pain and cough productive of small amount of sputum.  He states that these symptoms are almost identical to what he had earlier this year when they initially treated him for a month for PNA before ultimately lung biopsy by pulmonology made the diagnosis of lupus.  He states the pain in his chest is sharp and is worse when lying flat, somewhat better when leaning forward.  In the ED he was initially in A.Fib RVR with rate as high as 139 but spontaneously converted on his own.  Repeat EKG showed ST abnormalities in diffuse leads.  Troponin was negative (despite 3 days of chest pain).  Cardiology was consulted and feels that this likely represents acute pericarditis (either infectious from PNA or autoimmune from lupus).  Hospitalist has been asked to admit.  Review of Systems: 12 systems reviewed and otherwise negative.  Past Medical History  Diagnosis Date  . Lupus   . Coronary artery disease 2008/2009    MI with PCI x 2, then PCI x 1 in 2009  . Myocardial infarction 2008  . Dyslipidemia   . Hypertension   . Dysrhythmia     atrial fibrillation  . Lupus pericarditis   . Lupus disease of the lung    Past Surgical History  Procedure Laterality Date  . Coronary stents    . Cardiac catheterization     Social History:  reports that he has quit smoking. He does not have any smokeless tobacco history on file. He reports that he does not drink alcohol. His drug history is not on file.   No Known Allergies  No family history on file. No family history of autoimmune disease.  Prior to Admission medications    Medication Sig Start Date End Date Taking? Authorizing Provider  clopidogrel (PLAVIX) 75 MG tablet Take 75 mg by mouth daily.   Yes Historical Provider, MD  guaiFENesin (MUCINEX) 600 MG 12 hr tablet Take 1,200 mg by mouth 2 (two) times daily as needed for congestion.   Yes Historical Provider, MD  hydroxychloroquine (PLAQUENIL) 200 MG tablet Take 200 mg by mouth at bedtime.   Yes Historical Provider, MD  lisinopril (PRINIVIL,ZESTRIL) 2.5 MG tablet Take 2.5 mg by mouth daily.   Yes Historical Provider, MD  lovastatin (MEVACOR) 20 MG tablet Take 20 mg by mouth at bedtime.   Yes Historical Provider, MD  metoprolol tartrate (LOPRESSOR) 25 MG tablet Take 25 mg by mouth 2 (two) times daily.   Yes Historical Provider, MD   Physical Exam: Filed Vitals:   02/04/13 0213  BP: 106/69  Pulse: 69  Temp:   Resp: 18    General:  NAD, resting comfortably in bed Eyes: PEERLA EOMI ENT: mucous membranes moist Neck: supple w/o JVD Cardiovascular: RRR w/o MRG Respiratory: Patient has bibasilar fine inspiratory crackles with clear breath sounds during expiration. Abdomen: soft, nt, nd, bs+ Skin: no rash nor lesion Musculoskeletal: MAE, full ROM all 4 extremities Psychiatric: normal tone and affect Neurologic: AAOx3, grossly non-focal  Labs on Admission:  Basic Metabolic Panel:  Recent Labs Lab 02/03/13 2116  NA 141  K 4.0  CL 108  CO2 20  GLUCOSE 109*  BUN 9  CREATININE 0.89  CALCIUM 8.7   Liver Function Tests: No results found for this basename: AST, ALT, ALKPHOS, BILITOT, PROT, ALBUMIN,  in the last 168 hours No results found for this basename: LIPASE, AMYLASE,  in the last 168 hours No results found for this basename: AMMONIA,  in the last 168 hours CBC:  Recent Labs Lab 02/03/13 2116  WBC 11.1*  HGB 15.2  HCT 44.3  MCV 91.9  PLT 266   Cardiac Enzymes:  Recent Labs Lab 02/03/13 2340  TROPONINI <0.30    BNP (last 3 results)  Recent Labs  02/03/13 2116  PROBNP  971.3*   CBG: No results found for this basename: GLUCAP,  in the last 168 hours  Radiological Exams on Admission: Dg Chest 2 View  02/03/2013   *RADIOLOGY REPORT*  Clinical Data: Chest pain for 3 days.  CHEST - 2 VIEW  Comparison: None.  Findings: Moderate convex right thoracic spine curvature. Midline trachea.  Mild cardiomegaly with atherosclerosis in the transverse aorta.  No right and no definite left pleural effusion. No pneumothorax.  Left greater than right patchy bibasilar airspace disease.  Concurrent mild volume loss, especially at the left lung base.  IMPRESSION: Left greater than right bibasilar airspace disease.  Especially on the left, suspicious for infection.  Atelectasis with volume loss felt less likely. Recommend radiographic follow-up until clearing.  Cardiomegaly without congestive failure.   Original Report Authenticated By: Jeronimo Greaves, M.D.    EKG: Independently reviewed.  Assessment/Plan Active Problems:   Lupus disease of the lung   Lupus pericarditis   1. Pericarditis - Motrin, 2d echo and no anticoagulation for PAF. 2. Lung disease - got 1 dose of CAP coverage in ED, but I am highly suspicious that his lung disease represents ILD from lupus and not infectious PNA.  This is due to the history of the same a couple of months back with associated pericarditis at that time as well, the atypical picture on CXR, and the unusual exam findings of inspiratory crackles only.  Not ordering additional Abx at this time (as he would be covered for the first 24 hours anyhow), but have asked pulmonology to consult and give recommendations, I suspect steroids will be the mainstay of treatment in this patient.  Strongly doubt full blown lupus pneumonitis given the subacute onset and relative lack of severity (lupus pneumonitis typically presents as ARDS like picture).    Code Status: Full Code (must indicate code status--if unknown or must be presumed, indicate so) Family  Communication: No family in room (indicate person spoken with, if applicable, with phone number if by telephone) Disposition Plan: Admit to inpatient (indicate anticipated LOS)  Time spent: 70 min  Keon Pender M. [facility] Pager [phone]  If 7PM-7AM, please contact night-coverage [email or web address] Password [email or web address] 02/04/2013, 3:22 AM

## 2013-02-04 NOTE — ED Notes (Signed)
The pt has been seen by dr turner.  Now the pt is waiting for the hospitalist.  pts wife has gone home and the pt is attempting to sleep

## 2013-02-04 NOTE — Consult Note (Signed)
PULMONARY  / CRITICAL CARE MEDICINE  Name: Cody Arellano MRN: [id number] DOB: 1945-08-12    ADMISSION DATE:  02/03/2013 CONSULTATION DATE:  02/04/2013  REFERRING MD :  [facility] PRIMARY SERVICE:  TR[facility]  CHIEF COMPLAINT:  Chest pain  BRIEF PATIENT DESCRIPTION: 67 yo with CAD and Lupus admitted with chest pain, dyspnea, EKG changes consistent with pericarditis and bilateral airspace disease.  SIGNIFICANT EVENTS / STUDIES:  9/3  High resolution chest CT scan >>>  LINES / TUBES:  CULTURES:  ANTIBIOTICS: Ceftriaxone 9/3 >>> Azithromycin 9/3 >>>  HISTORY OF PRESENT ILLNESS:  67 yo with CAD and Lupus who presented to [facility] ED with chest pain, dyspnea and cough with clear phlegm for 3 days.  Chest pain is described as sharp, aggravated by deep breath and lying on either side, without significant alleviating factors and is different in quality from his angina.  Dyspnea is mainly on exertion, and he denies orthopnea, paroxysmal nocturnal dyspnea or pedal edema.  There was no purulent secretions or hemoptysis.  There was subjective fever but he never checked his temperature.  He reports nocturnal sweats.  Of note, the patient was seen with similar presentation in February of 2104, was reportedly diagnosed with lupus related lung disease by lung bx and treated with Prednisone with complete resolution of symptoms.  He was then started on Plaquenil and steroids were tapered off.   PAST MEDICAL HISTORY :  Past Medical History  Diagnosis Date  . Lupus   . Coronary artery disease 2008/2009    MI with PCI x 2, then PCI x 1 in 2009  . Myocardial infarction 2008  . Dyslipidemia   . Hypertension   . Dysrhythmia     atrial fibrillation  . Lupus pericarditis   . Lupus disease of the lung    Past Surgical History  Procedure Laterality Date  . Coronary stents    . Cardiac catheterization     Prior to Admission medications   Medication Sig Start Date End Date Taking? Authorizing Provider  clopidogrel (PLAVIX) 75  MG tablet Take 75 mg by mouth daily.   Yes Historical Provider, MD  guaiFENesin (MUCINEX) 600 MG 12 hr tablet Take 1,200 mg by mouth 2 (two) times daily as needed for congestion.   Yes Historical Provider, MD  hydroxychloroquine (PLAQUENIL) 200 MG tablet Take 200 mg by mouth at bedtime.   Yes Historical Provider, MD  lisinopril (PRINIVIL,ZESTRIL) 2.5 MG tablet Take 2.5 mg by mouth daily.   Yes Historical Provider, MD  lovastatin (MEVACOR) 20 MG tablet Take 20 mg by mouth at bedtime.   Yes Historical Provider, MD  metoprolol tartrate (LOPRESSOR) 25 MG tablet Take 25 mg by mouth 2 (two) times daily.   Yes Historical Provider, MD   No Known Allergies  FAMILY HISTORY:  No family history on file.  SOCIAL HISTORY:  reports that he has quit smoking. He does not have any smokeless tobacco history on file. He reports that he does not drink alcohol. His drug history is not on file.  REVIEW OF SYSTEMS:   Constitutional: Negative for chills, weight loss, and diaphoresis. Positive for fatigue, subjective fever and diaphoresis.  HENT: Negative for hearing loss, ear pain, nosebleeds, congestion, sore throat, neck pain, tinnitus and ear discharge.   Eyes: Negative for blurred vision, double vision, photophobia, pain, discharge and redness.  Respiratory: Negative for hemoptysis, wheezing and stridor.  Positive for dyspnea on exertion and cough productive of clear sputum. Cardiovascular: Negative for palpitations, orthopnea, claudication, leg swelling  and PND. Positive for sharp chest pain related to body position and respirations. Gastrointestinal: Negative for heartburn, nausea, vomiting, abdominal pain, diarrhea, constipation, blood in stool and melena.  Genitourinary: Negative for dysuria, urgency, frequency, hematuria and flank pain.  Musculoskeletal: Negative for myalgias, back pain, joint pain and falls.  Skin: Negative for itching and rash.  Neurological: Negative for dizziness, tingling, tremors,  sensory change, speech change, focal weakness, seizures, loss of consciousness, weakness and headaches.  Endo/Heme/Allergies: Negative for environmental allergies and polydipsia. Does not bruise/bleed easily.  SUBJECTIVE:   VITAL SIGNS: Temp:  [98 F (36.7 C)-98.1 F (36.7 C)] 98 F (36.7 C) (09/03 0401) Pulse Rate:  [64-139] 70 (09/03 0401) Resp:  [18-25] 20 (09/03 0401) BP: (90-130)/(52-72) 130/70 mmHg (09/03 0401) SpO2:  [91 %-98 %] 97 % (09/03 0401) Weight:  [91.173 kg (201 lb)] 91.173 kg (201 lb) (09/03 0401)  PHYSICAL EXAMINATION: General:  Resting comfortably, appears to be in no acute distress Neuro:  Awake, alert, cooperative with examination, non-focal HEENT:  NCAT, PERRL, moist membranes Neck:  Soft, no bruits, no lymphadenopathy Cardiovascular:  RRR, no m/r/g Lungs:  Bilateral air entry, few bibasilar rales Abdomen:  Soft, nontender, bowel sounds present Musculoskeletal:  Moves all extremities, no edema Skin:  Intact, no rash   Recent Labs Lab 02/03/13 2116  NA 141  K 4.0  CL 108  CO2 20  BUN 9  CREATININE 0.89  GLUCOSE 109*    Recent Labs Lab 02/03/13 2116  HGB 15.2  HCT 44.3  WBC 11.1*  PLT 266   Dg Chest 2 View  02/03/2013   *RADIOLOGY REPORT*  Clinical Data: Chest pain for 3 days.  CHEST - 2 VIEW  Comparison: None.  Findings: Moderate convex right thoracic spine curvature. Midline trachea.  Mild cardiomegaly with atherosclerosis in the transverse aorta.  No right and no definite left pleural effusion. No pneumothorax.  Left greater than right patchy bibasilar airspace disease.  Concurrent mild volume loss, especially at the left lung base.  IMPRESSION: Left greater than right bibasilar airspace disease.  Especially on the left, suspicious for infection.  Atelectasis with volume loss felt less likely. Recommend radiographic follow-up until clearing.  Cardiomegaly without congestive failure.   Original Report Authenticated By: Jeronimo Greaves, M.D.    ASSESSMENT / PLAN:  Lupus, suspected flare ILD, likely secondary to lupus Community acquired pneumonia, less likely Acute pericarditis, possibly effusion, less likely tamponade Paroxysmal atrial fibrillation, now resolved  -->  Please request records from February admission in [place] (specifically biopsy results and chest imaging) -->  ESR, CRP, C3, C4 -->  PCT -->  High resolution chest CT without contrast -->  Prednisone 60 mg PO daily starting AM -->  Continue Plaquenil -->  Agree with empirical Ceftriaxone / Azithromycin for now -->  Review TTE results when available -->  Supplemental oxygen, goal SpO2>=92 -->  Will follow, likely needs to be established with PCCM as outpatient upon discharge  Lonia Farber, MD Pulmonary and Critical Care Medicine [facility] Pager: [phone]  02/04/2013, 4:35 AM

## 2013-02-04 NOTE — ED Notes (Signed)
The pt still has no chest pain.  Resting comfortably with wife at the bedside

## 2013-02-05 DIAGNOSIS — I32 Pericarditis in diseases classified elsewhere: Secondary | ICD-10-CM

## 2013-02-05 DIAGNOSIS — I4891 Unspecified atrial fibrillation: Secondary | ICD-10-CM

## 2013-02-05 MED ORDER — LEVOFLOXACIN 750 MG PO TABS: 750.0000 mg | ORAL_TABLET | Freq: Every day | ORAL | Status: DC

## 2013-02-05 MED ORDER — PREDNISONE 20 MG PO TABS: 40.0000 mg | ORAL_TABLET | Freq: Every day | ORAL | Status: DC

## 2013-02-05 NOTE — Progress Notes (Signed)
PULMONARY  / CRITICAL CARE MEDICINE  Name: Cody Arellano MRN: [id number] DOB: 11/18/1945    ADMISSION DATE:  02/03/2013 CONSULTATION DATE:  02/04/2013  REFERRING MD :  [facility] PRIMARY SERVICE:  TRH  CHIEF COMPLAINT:  Chest pain  BRIEF PATIENT DESCRIPTION: 67 y/o with CAD and Lupus admitted with chest pain, dyspnea, EKG changes consistent with pericarditis and bilateral airspace disease.  SIGNIFICANT EVENTS / STUDIES:  9/03 - HR chest CT scan >>>bilateral lower infiltrates, favor infectious etiology.  Scattered pulmonary nodules up to 8 mm, small L pleural effusion, small pericardial effusion   LINES / TUBES:  CULTURES:  ANTIBIOTICS: Ceftriaxone 9/3 >>> Azithromycin 9/3 >>>  SUBJECTIVE: Pt reports improved chest pain, denies SOB  VITAL SIGNS: Temp:  [97.5 F (36.4 C)-98.2 F (36.8 C)] 97.5 F (36.4 C) (09/04 0457) Pulse Rate:  [64-103] 103 (09/04 0457) Resp:  [18] 18 (09/04 0457) BP: (108-113)/(60-67) 108/67 mmHg (09/04 0457) SpO2:  [92 %-96 %] 95 % (09/04 0457)  PHYSICAL EXAMINATION: General:  Resting comfortably, appears to be in no acute distress Neuro:  Awake, alert, cooperative with examination, non-focal HEENT:  NCAT, PERRL, moist membranes Neck:  Soft, no bruits, no lymphadenopathy Cardiovascular:  RRR, no m/r/g Lungs:  Bilateral air entry, few bibasilar rales Abdomen:  Soft, nontender, bowel sounds present Musculoskeletal:  Moves all extremities, no edema Skin:  Intact, no rash   Recent Labs Lab 02/03/13 2116 02/04/13 0448  NA 141 137  K 4.0 3.7  CL 108 104  CO2 20 23  BUN 9 8  CREATININE 0.89 0.89  GLUCOSE 109* 98    Recent Labs Lab 02/03/13 2116 02/04/13 0400  HGB 15.2 14.1  HCT 44.3 41.8  WBC 11.1* 9.8  PLT 266 257   Dg Chest 2 View  02/03/2013   *RADIOLOGY REPORT*  Clinical Data: Chest pain for 3 days.  CHEST - 2 VIEW  Comparison: None.  Findings: Moderate convex right thoracic spine curvature. Midline trachea.  Mild cardiomegaly with  atherosclerosis in the transverse aorta.  No right and no definite left pleural effusion. No pneumothorax.  Left greater than right patchy bibasilar airspace disease.  Concurrent mild volume loss, especially at the left lung base.  IMPRESSION: Left greater than right bibasilar airspace disease.  Especially on the left, suspicious for infection.  Atelectasis with volume loss felt less likely. Recommend radiographic follow-up until clearing.  Cardiomegaly without congestive failure.   Original Report Authenticated By: Jeronimo Greaves, M.D.   Ct Chest Wo Contrast  02/04/2013   *RADIOLOGY REPORT*  Clinical Data: Chest pain, right greater than left.  Shortness of breath.  Possible interstitial lung disease.  CT CHEST WITHOUT CONTRAST  Technique:  Multidetector CT imaging of the chest was performed following the standard protocol without IV contrast.  Comparison: Chest radiograph 02/03/2013.  Findings: No definite pathologically enlarged mediastinal or axillary lymph nodes.  Internal mammary lymph nodes are sub centimeter in size.  Difficult to definitively evaluate the hilar regions without IV contrast.  Coronary artery calcification.  Heart size at the upper limits of normal in size to mildly enlarged. Small pericardial effusion.  Patchy airspace consolidation is seen at the lung bases, primarily within both lower lobes.  Scattered cyst formation is seen in association.  Scattered pulmonary nodular densities measure up to 8 mm in the subpleural right middle lobe (image 34).  No definite traction bronchiectasis or honeycombing.  There may be minimal subpleural reticulation in the apical right upper lobe.  Tiny left pleural effusion.  Airway  is unremarkable.  Incidental imaging of the upper abdomen shows no acute findings. No worrisome lytic or sclerotic lesions.  IMPRESSION:  1.  Bilateral lower lobe predominant air space consolidation with some areas of cystic change.  An acute infectious process is favored.  Difficult to  assess for underlying interstitial lung disease. 2.  Scattered small pulmonary nodular densities, measuring up to 8 mm.  If the patient is at high risk for bronchogenic carcinoma, follow-up chest CT at 3-6 months is recommended.  If the patient is at low risk for bronchogenic carcinoma, follow-up chest CT at 6-12 months is recommended.  This recommendation follows the consensus statement: Guidelines for Management of Small Pulmonary Nodules Detected on CT Scans: A Statement from the Fleischner Society as published in Radiology 2005; 237:395-400. 3.  Tiny left pleural effusion and small pericardial effusion.   Original Report Authenticated By: Leanna Battles, M.D.   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 Appearance most consistent with atypical infxn, mycobacterial disease. No large scale GGI to support a lupus pneumonitis, but must consider.  Possible Community acquired pneumonia Paroxysmal atrial fibrillation - PAF, intermittent  -Please request records from February admission in [place] (specifically biopsy results and chest imaging) -Prednisone 60 mg PO daily starting AM -->leave on 40mg  QD x 3 weeks until seen by Dr. Delton Coombes outpt pulmonary 9/25 3:15pm -will likely need FOB as an outpt to assess for chronic mycobacterial disease. Would complete therapy for lupus pericarditis first, follow chest imaging to guide timing  -Continue Plaquenil -would change to PO Levaquin or Avelox for 7 day total abx coverage   -Supplemental oxygen, goal SpO2>=92 -Will follow, will see Dr Delton Coombes (arranged) as outpatient upon discharge. Also needs f/u with cards, rheumatology & PCP.  Lives in [place] for d/c home from pulmonary perspective.    Canary Brim, NP-C [facility] Pgr: [phone] or [phone] 02/05/2013, 10:37 AM  Please call if we can help  further.   Levy Pupa, MD, PhD 02/05/2013, 12:38 PM [facility] [phone] or if no answer [phone]

## 2013-02-05 NOTE — Discharge Summary (Signed)
Physician Discharge Summary  Cody Arellano NWG:[id number] DOB: May 07, 1946 DOA: 02/03/2013  PCP: No primary provider on file.  Admit date: 02/03/2013 Discharge date: 02/05/2013  Time spent: 40 minutes  Recommendations for Outpatient Follow-up:  1. Followup with Dr. Delton Coombes on 02/26/2013. 2. Followup with Dr. Elease Hashimoto in one month  Discharge Diagnoses:  Principal Problem:   Acute pericarditis Active Problems:   Lupus disease of the lung   Lupus pericarditis   ILD (interstitial lung disease)   CAP (community acquired pneumonia)   Discharge Condition: Stable  Diet recommendation: Heart healthy diet  Filed Weights   02/04/13 0401  Weight: 91.173 kg (201 lb)    History of present illness:  Cody Arellano is a 67 y.o. male with h/o lupus pericarditis and lupus lung disease (likely ILD not pneumonitis) both of which occurred earlier this year, who presents to the ED with c/o Chest pain and cough productive of small amount of sputum. He states that these symptoms are almost identical to what he had earlier this year when they initially treated him for a month for PNA before ultimately lung biopsy by pulmonology made the diagnosis of lupus. He states the pain in his chest is sharp and is worse when lying flat, somewhat better when leaning forward.  In the ED he was initially in A.Fib RVR with rate as high as 139 but spontaneously converted on his own. Repeat EKG showed ST abnormalities in diffuse leads. Troponin was negative (despite 3 days of chest pain). Cardiology was consulted and feels that this likely represents acute pericarditis (either infectious from PNA or autoimmune from lupus). Hospitalist has been asked to admit.  Hospital Course:   1. Acute pericarditis: Patient came in to the hospital with productive cough and minimal sputum production. His EKG showed evidence of acute pericarditis, 2-D echocardiogram was done and showed no pericardial effusion. Patient is on Plaquenil upon admission so  ibuprofen and prednisone was started. On discharge pulmonology recommended prednisone 40 mg till he sees pulmonary in the office. Patient will followup with cardiology in one month. Patient is chest pain-free, he does not have any complaints prior to discharge.  2. History fibrillation: Patient does have paroxysmal is a fibrillation, diagnosed last February, he was on intrafibrillation last night and converted to sinus rhythm this morning. Patient rate is controlled with metoprolol, he is on Plavix.  3. Lupus: With suspected flareup, elevated ESR and CRP. Patient is on Plaquenil started on prednisone, started initially at 60 mg daily, which was switched to 40 mg because of pulmonology recommendation. Patient will followup with pulmonary and rheumatology as outpatient.  4. Pneumonia: As mentioned above patient presented with cough, sputum production chest discomfort. Chest x-ray was done and showed interstitial lung disease likely secondary to lupus. CT scan was done and showed airspace consolidation likely infectious. Radiologist says is difficult to assess underlying interstitial lung disease. Pulmonology recommended FOB as outpatient to rule out MAC and other pathologies.  Procedures:  2-D echo: Study Conclusions  - Left ventricle: The cavity size was normal. Wall thickness was normal. Systolic function was normal. The estimated ejection fraction was in the range of 55% to 60%. Although no diagnostic regional wall motion abnormality was identified, this possibility cannot be completely excluded on the basis of this study. - Mitral valve: Calcified annulus. Mild regurgitation.  Consultations:  Dr. Delton Coombes of [facility] pulmonology.  Dr. Elease Hashimoto of [facility] cardiology  Discharge Exam: Filed Vitals:   02/05/13 0457  BP: 108/67  Pulse: 103  Temp: 97.5  F (36.4 C)  Resp: 18  General: Alert and awake, oriented x3, not in any acute distress. HEENT: anicteric sclera, pupils reactive to light  and accommodation, EOMI CVS: S1-S2 clear, no murmur rubs or gallops Chest: clear to auscultation bilaterally, no wheezing, rales or rhonchi Abdomen: soft nontender, nondistended, normal bowel sounds, no organomegaly Extremities: no cyanosis, clubbing or edema noted bilaterally Neuro: Cranial nerves II-XII intact, no focal neurological deficits  Discharge Instructions  Discharge Orders   Future Appointments Provider Department Dept Phone   02/26/2013 3:15 PM Leslye Peer, MD [facility] [phone]   Future Orders Complete By Expires   Diet - low sodium heart healthy  As directed    Increase activity slowly  As directed        Medication List         clopidogrel 75 MG tablet  Commonly known as:  PLAVIX  Take 75 mg by mouth daily.     hydroxychloroquine 200 MG tablet  Commonly known as:  PLAQUENIL  Take 200 mg by mouth at bedtime.     levofloxacin 750 MG tablet  Commonly known as:  LEVAQUIN  Take 1 tablet (750 mg total) by mouth daily.     lisinopril 2.5 MG tablet  Commonly known as:  PRINIVIL,ZESTRIL  Take 2.5 mg by mouth daily.     lovastatin 20 MG tablet  Commonly known as:  MEVACOR  Take 20 mg by mouth at bedtime.     metoprolol tartrate 25 MG tablet  Commonly known as:  LOPRESSOR  Take 25 mg by mouth 2 (two) times daily.     MUCINEX 600 MG 12 hr tablet  Generic drug:  guaiFENesin  Take 1,200 mg by mouth 2 (two) times daily as needed for congestion.     predniSONE 20 MG tablet  Commonly known as:  DELTASONE  Take 2 tablets (40 mg total) by mouth daily with breakfast.       No Known Allergies     Follow-up Information   Follow up with Leslye Peer., MD On 02/26/2013. (Appt at 3:15)    Specialty:  Pulmonary Disease   Contact information:   [address] [place] [address] [phone]       Follow up with Elyn Aquas., MD In 1 month.   Specialty:  Cardiology   Contact information:   [address] [place] [address] [phone]        The results of significant diagnostics from this hospitalization (including imaging, microbiology, ancillary and laboratory) are listed below for reference.    Significant Diagnostic Studies: Dg Chest 2 View  02/03/2013   *RADIOLOGY REPORT*  Clinical Data: Chest pain for 3 days.  CHEST - 2 VIEW  Comparison: None.  Findings: Moderate convex right thoracic spine curvature. Midline trachea.  Mild cardiomegaly with atherosclerosis in the transverse aorta.  No right and no definite left pleural effusion. No pneumothorax.  Left greater than right patchy bibasilar airspace disease.  Concurrent mild volume loss, especially at the left lung base.  IMPRESSION: Left greater than right bibasilar airspace disease.  Especially on the left, suspicious for infection.  Atelectasis with volume loss felt less likely. Recommend radiographic follow-up until clearing.  Cardiomegaly without congestive failure.   Original Report Authenticated By: Jeronimo Greaves, M.D.   Ct Chest Wo Contrast  02/04/2013   *RADIOLOGY REPORT*  Clinical Data: Chest pain, right greater than left.  Shortness of breath.  Possible interstitial lung disease.  CT CHEST WITHOUT CONTRAST  Technique:  Multidetector CT imaging of the chest was performed following the standard protocol without IV contrast.  Comparison: Chest radiograph 02/03/2013.  Findings: No definite pathologically enlarged mediastinal or axillary lymph nodes.  Internal mammary lymph nodes are sub centimeter in size.  Difficult to definitively evaluate the hilar regions without IV contrast.  Coronary artery calcification.  Heart size at the upper limits of normal in size to mildly enlarged. Small pericardial effusion.  Patchy airspace consolidation is seen at the lung bases, primarily within both lower lobes.  Scattered cyst formation is seen in association.  Scattered pulmonary nodular densities measure up to 8 mm in the subpleural right middle  lobe (image 34).  No definite traction bronchiectasis or honeycombing.  There may be minimal subpleural reticulation in the apical right upper lobe.  Tiny left pleural effusion.  Airway is unremarkable.  Incidental imaging of the upper abdomen shows no acute findings. No worrisome lytic or sclerotic lesions.  IMPRESSION:  1.  Bilateral lower lobe predominant air space consolidation with some areas of cystic change.  An acute infectious process is favored.  Difficult to assess for underlying interstitial lung disease. 2.  Scattered small pulmonary nodular densities, measuring up to 8 mm.  If the patient is at high risk for bronchogenic carcinoma, follow-up chest CT at 3-6 months is recommended.  If the patient is at low risk for bronchogenic carcinoma, follow-up chest CT at 6-12 months is recommended.  This recommendation follows the consensus statement: Guidelines for Management of Small Pulmonary Nodules Detected on CT Scans: A Statement from the Fleischner Society as published in Radiology 2005; 237:395-400. 3.  Tiny left pleural effusion and small pericardial effusion.   Original Report Authenticated By: Leanna Battles, M.D.    Microbiology: No results found for this or any previous visit (from the past 240 hour(s)).   Labs: Basic Metabolic Panel:  Recent Labs Lab 02/03/13 2116 02/04/13 0448  NA 141 137  K 4.0 3.7  CL 108 104  CO2 20 23  GLUCOSE 109* 98  BUN 9 8  CREATININE 0.89 0.89  CALCIUM 8.7 8.5   Liver Function Tests: No results found for this basename: AST, ALT, ALKPHOS, BILITOT, PROT, ALBUMIN,  in the last 168 hours No results found for this basename: LIPASE, AMYLASE,  in the last 168 hours No results found for this basename: AMMONIA,  in the last 168 hours CBC:  Recent Labs Lab 02/03/13 2116 02/04/13 0400  WBC 11.1* 9.8  HGB 15.2 14.1  HCT 44.3 41.8  MCV 91.9 92.1  PLT 266 257   Cardiac Enzymes:  Recent Labs Lab 02/03/13 2340  TROPONINI <0.30   BNP: BNP (last  3 results)  Recent Labs  02/03/13 2116 02/04/13 0448  PROBNP 971.3* 772.3*   CBG: No results found for this basename: GLUCAP,  in the last 168 hours     Signed:  Sanskriti Greenlaw A  [facility] 02/05/2013, 2:00 PM

## 2013-02-05 NOTE — Progress Notes (Signed)
Admit date: 02/03/2013 Referring Physician  Dr. Beverely Pace Primary Physician  None Primary Cardiologist  New to [facility] Reason for Consultation  Chest pain/pericarditis  HPI: This is a 67yo AAM with a remote history of MI in 2008 at which time he got 2 stents and then repeat cath in 2009 with PCI x1, and recent hospitalization in February in [place] for acute pericarditis as a complication of lupus and PNA.  He CP at that time was sharp and was treated with steroids.  He has been in his USOH when he started have SOB, pleuritic CP and fevers and significant night sweats.  This has been going on for 3 days.  The CP has been constant since then and is identical to his CP with hie pericarditis.  The CP is not like his CAD at all.  He has also had a cough productive of clear phlegm.  He was noted in ER to be in atrial fibrillation which resolved spontaneously to NSR with ST elevation in I and aVL with PR depression and T wave inversions in III, aVF and V3-V4.  He was noted on chest xray to have airspace disease suspicious for PNA.  Cardiology was consulted to evaluate abnormal EKG and possible pericarditis.  He has had constant CP for 3 days with normal troponin.  His pain is pleuretic and positional.  Not at all similar to his angina pain.  Echo shows normal LV function.  No pericardial effusion.     PMH:   Past Medical History  Diagnosis Date  . Lupus   . Coronary artery disease 2008/2009    MI with PCI x 2, then PCI x 1 in 2009  . Myocardial infarction 2008  . Dyslipidemia   . Hypertension   . Dysrhythmia     atrial fibrillation  . Lupus pericarditis   . Lupus disease of the lung   . Shortness of breath      PSH:   Past Surgical History  Procedure Laterality Date  . Coronary stents    . Cardiac catheterization      Allergies:  Review of patient's allergies indicates no known allergies. Prior to Admit Meds:   Prescriptions prior to admission  Medication Sig Dispense Refill  . clopidogrel  (PLAVIX) 75 MG tablet Take 75 mg by mouth daily.      [name] guaiFENesin (MUCINEX) 600 MG 12 hr tablet Take 1,200 mg by mouth 2 (two) times daily as needed for congestion.      . hydroxychloroquine (PLAQUENIL) 200 MG tablet Take 200 mg by mouth at bedtime.      [name] lisinopril (PRINIVIL,ZESTRIL) 2.5 MG tablet Take 2.5 mg by mouth daily.      [name] lovastatin (MEVACOR) 20 MG tablet Take 20 mg by mouth at bedtime.      . metoprolol tartrate (LOPRESSOR) 25 MG tablet Take 25 mg by mouth 2 (two) times daily.       Fam HX:   History reviewed. No pertinent family history. Social HX:    History   Social History  . Marital Status: Married    Spouse Name: N/A    Number of Children: N/A  . Years of Education: N/A   Occupational History  . Not on file.   Social History Main Topics  . Smoking status: Former Games developer  . Smokeless tobacco: Never Used     Comment: QUIT SMOKING 20 YEARS AGO  . Alcohol Use: No  . Drug Use: No  . Sexual Activity: Not on file  Other Topics Concern  . Not on file   Social History Narrative  . No narrative on file      Physical Exam: Blood pressure 108/67, pulse 103, temperature 97.5 F (36.4 C), temperature source Oral, resp. rate 18, height 6' (1.829 m), weight 201 lb (91.173 kg), SpO2 95.00%.    General: Well developed, well nourished, in no acute distress Head: Eyes PERRLA, No xanthomas.   Normal cephalic and atramatic  Lungs:   Clear bilaterally to auscultation and percussion. Heart:   HRRR S1 S2 Pulses are 2+ & equal. ? Faint pericardia friction rub            No carotid bruit. No JVD.  No abdominal bruits. No femoral bruits. Abdomen: Bowel sounds are positive, abdomen soft and non-tender without masses  Extremities:   No clubbing, cyanosis or edema.  DP +1 Neuro: Alert and oriented X 3. Psych:  Good affect, responds appropriately    Labs:   Lab Results  Component Value Date   WBC 9.8 02/04/2013   HGB 14.1 02/04/2013   HCT 41.8 02/04/2013   MCV 92.1 02/04/2013    PLT 257 02/04/2013     Recent Labs Lab 02/04/13 0448  NA 137  K 3.7  CL 104  CO2 23  BUN 8  CREATININE 0.89  CALCIUM 8.5  GLUCOSE 98   No results found for this basename: PTT   No results found for this basename: INR,  PROTIME   Lab Results  Component Value Date   TROPONINI <0.30 02/03/2013         Radiology:  Dg Chest 2 View  02/03/2013   *RADIOLOGY REPORT*  Clinical Data: Chest pain for 3 days.  CHEST - 2 VIEW  Comparison: None.  Findings: Moderate convex right thoracic spine curvature. Midline trachea.  Mild cardiomegaly with atherosclerosis in the transverse aorta.  No right and no definite left pleural effusion. No pneumothorax.  Left greater than right patchy bibasilar airspace disease.  Concurrent mild volume loss, especially at the left lung base.  IMPRESSION: Left greater than right bibasilar airspace disease.  Especially on the left, suspicious for infection.  Atelectasis with volume loss felt less likely. Recommend radiographic follow-up until clearing.  Cardiomegaly without congestive failure.   Original Report Authenticated By: Jeronimo Greaves, M.D.   Ct Chest Wo Contrast  02/04/2013   *RADIOLOGY REPORT*  Clinical Data: Chest pain, right greater than left.  Shortness of breath.  Possible interstitial lung disease.  CT CHEST WITHOUT CONTRAST  Technique:  Multidetector CT imaging of the chest was performed following the standard protocol without IV contrast.  Comparison: Chest radiograph 02/03/2013.  Findings: No definite pathologically enlarged mediastinal or axillary lymph nodes.  Internal mammary lymph nodes are sub centimeter in size.  Difficult to definitively evaluate the hilar regions without IV contrast.  Coronary artery calcification.  Heart size at the upper limits of normal in size to mildly enlarged. Small pericardial effusion.  Patchy airspace consolidation is seen at the lung bases, primarily within both lower lobes.  Scattered cyst formation is seen in association.   Scattered pulmonary nodular densities measure up to 8 mm in the subpleural right middle lobe (image 34).  No definite traction bronchiectasis or honeycombing.  There may be minimal subpleural reticulation in the apical right upper lobe.  Tiny left pleural effusion.  Airway is unremarkable.  Incidental imaging of the upper abdomen shows no acute findings. No worrisome lytic or sclerotic lesions.  IMPRESSION:  1.  Bilateral lower  lobe predominant air space consolidation with some areas of cystic change.  An acute infectious process is favored.  Difficult to assess for underlying interstitial lung disease. 2.  Scattered small pulmonary nodular densities, measuring up to 8 mm.  If the patient is at high risk for bronchogenic carcinoma, follow-up chest CT at 3-6 months is recommended.  If the patient is at low risk for bronchogenic carcinoma, follow-up chest CT at 6-12 months is recommended.  This recommendation follows the consensus statement: Guidelines for Management of Small Pulmonary Nodules Detected on CT Scans: A Statement from the Fleischner Society as published in Radiology 2005; 237:395-400. 3.  Tiny left pleural effusion and small pericardial effusion.   Original Report Authenticated By: Leanna Battles, M.D.    EKG:  #1Atrial fibrillation with nonspecific T wave abnormality.  #2 NSR with 1mm of J point elevation in I and aVL and t wave inversions in III and aVF  ASSESSMENT:  1.  Acute pericarditis most likely secondary to acute pulmonary infection +/- lupus flare 2.  Probable PNA by chest xray and symptoms with elevated WBC 3.  Lupus 4.  CAD with remote PCI 5.  Elevated BNP but no chest xray findings of CHF - may be due to afib  He is feeling much better.   This pain is clearly non-coronary.  He can be discharged at any point from my standpoint.  He needs further treatment of his Lupus.  I will be happy to see him in the office in several months ( he can see the PA if he needs to be seen sooner).    Please include my name and contact info on the DC instructions   Alvia Grove., MD, [facility] 02/05/2013, 8:17 AM Office - [phone] Pager [phone]

## 2013-02-26 ENCOUNTER — Ambulatory Visit (INDEPENDENT_AMBULATORY_CARE_PROVIDER_SITE_OTHER): Payer: Federal, State, Local not specified - PPO | Admitting: Emergency Medicine

## 2013-02-26 ENCOUNTER — Encounter: Payer: Self-pay | Admitting: Emergency Medicine

## 2013-02-26 VITALS — BP 100/60 | HR 55 | Temp 97.5°F | Ht 72.0 in | Wt 208.4 lb

## 2013-02-26 DIAGNOSIS — M329 Systemic lupus erythematosus, unspecified: Secondary | ICD-10-CM

## 2013-02-26 DIAGNOSIS — J99 Respiratory disorders in diseases classified elsewhere: Secondary | ICD-10-CM

## 2013-02-26 DIAGNOSIS — M3213 Lung involvement in systemic lupus erythematosus: Secondary | ICD-10-CM

## 2013-02-26 MED ORDER — PREDNISONE 20 MG PO TABS: 40.0000 mg | ORAL_TABLET | Freq: Every day | ORAL | Status: DC

## 2013-02-26 NOTE — Assessment & Plan Note (Signed)
-   ok to reduce pred to 20, continue plaquanil - need repeat Ct scan in mid-October - consider FOB depending on suspicion for atypical infxn  - will refer to dr Nickola Major to plan his immunosuppression.  - rov mid-October

## 2013-02-26 NOTE — Patient Instructions (Addendum)
Please reduce your prednisone to 20mg  daily until our next visit We will repeat your CT scan of the chest in mid-October.  We will arrange for you to see Dr Nickola Major with Rheumatology to discuss next steps with your medications We will get your records from [facility] Follow with Dr Delton Coombes after your CT scan in October to review

## 2013-02-26 NOTE — Progress Notes (Signed)
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 Discharged on Pred down to 40mg , plaquanil. He was treated for CAP (completed). Appearance on CT also suspicious for possible atypical infxn. He has had lung bx in [place] 07/2012. Needs local Rheumatologist. Feels better.    Past Medical History  Diagnosis Date  . Lupus   . Coronary artery disease 2008/2009    MI with PCI x 2, then PCI x 1 in 2009  . Myocardial infarction 2008  . Dyslipidemia   . Hypertension   . Dysrhythmia     atrial fibrillation  . Lupus pericarditis   . Lupus disease of the lung   . Shortness of breath      No family history on file.   History   Social History  . Marital Status: Married    Spouse Name: N/A    Number of Children: N/A  . Years of Education: N/A   Occupational History  . Not on file.   Social History Main Topics  . Smoking status: Former Smoker -- 1.00 packs/day for 20 years    Types: Cigarettes    Quit date: 02/26/1993  . Smokeless tobacco: Never Used     Comment: QUIT SMOKING 20 YEARS AGO  . Alcohol Use: No  . Drug Use: No  . Sexual Activity: Not on file   Other Topics Concern  . Not on file   Social History Narrative  . No narrative on file  Contracting with the Military, asbestos exposure.  Was in [employer] [place]. 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   No Known Allergies   Outpatient Prescriptions Prior to Visit  Medication Sig Dispense Refill  . clopidogrel (PLAVIX) 75 MG tablet Take 75 mg by mouth daily.      [name] guaiFENesin (MUCINEX) 600 MG 12 hr tablet Take 1,200 mg by mouth 2 (two) times daily as needed for congestion.      . hydroxychloroquine (PLAQUENIL) 200 MG tablet Take 200 mg by mouth at bedtime.      [name] lisinopril (PRINIVIL,ZESTRIL) 2.5 MG tablet Take 2.5 mg by mouth daily.      [name] lovastatin (MEVACOR) 20 MG tablet Take 20 mg by mouth at bedtime.      . metoprolol tartrate (LOPRESSOR) 25 MG tablet Take 25 mg by  mouth 2 (two) times daily.      . predniSONE (DELTASONE) 20 MG tablet Take 2 tablets (40 mg total) by mouth daily with breakfast.  60 tablet  0  . levofloxacin (LEVAQUIN) 750 MG tablet Take 1 tablet (750 mg total) by mouth daily.  7 tablet  0   No facility-administered medications prior to visit.   Filed Vitals:   02/26/13 1539  BP: 100/60  Pulse: 55  Temp: 97.5 F (36.4 C)  TempSrc: Oral  Height: 6' (1.829 m)  Weight: 208 lb 6.4 oz (94.53 kg)  SpO2: 96%   Gen: Pleasant, well-nourished, in no distress,  normal affect  ENT: No lesions,  mouth clear,  oropharynx clear, no postnasal drip  Neck: No JVD, no TMG, no carotid bruits  Lungs: No use of accessory muscles, no dullness to percussion, clear without rales or rhonchi  Cardiovascular: RRR, heart sounds normal, no murmur or gallops, no peripheral edema  Musculoskeletal: No deformities, no cyanosis or clubbing  Neuro: alert, non focal  Skin: Warm, no lesions or rashes   Lupus disease of the lung - ok to reduce pred to  20, continue plaquanil - need repeat Ct scan in mid-October - consider FOB depending on suspicion for atypical infxn  - will refer to dr Nickola Major to plan his immunosuppression.  - rov mid-October

## 2013-03-18 ENCOUNTER — Ambulatory Visit (INDEPENDENT_AMBULATORY_CARE_PROVIDER_SITE_OTHER)
Admission: RE | Admit: 2013-03-18 | Discharge: 2013-03-18 | Disposition: A | Discharge status: Home / Self Care | Payer: Federal, State, Local not specified - PPO | Source: Ambulatory Visit | Attending: Emergency Medicine | Admitting: Emergency Medicine

## 2013-03-18 DIAGNOSIS — J99 Respiratory disorders in diseases classified elsewhere: Secondary | ICD-10-CM

## 2013-03-18 DIAGNOSIS — M3213 Lung involvement in systemic lupus erythematosus: Secondary | ICD-10-CM

## 2013-03-18 DIAGNOSIS — M329 Systemic lupus erythematosus, unspecified: Secondary | ICD-10-CM

## 2013-04-07 ENCOUNTER — Ambulatory Visit (INDEPENDENT_AMBULATORY_CARE_PROVIDER_SITE_OTHER): Payer: Federal, State, Local not specified - PPO | Admitting: Emergency Medicine

## 2013-04-07 ENCOUNTER — Encounter: Payer: Self-pay | Admitting: Emergency Medicine

## 2013-04-07 VITALS — BP 120/70 | HR 58 | Ht 72.0 in | Wt 208.8 lb

## 2013-04-07 DIAGNOSIS — J8410 Pulmonary fibrosis, unspecified: Secondary | ICD-10-CM

## 2013-04-07 DIAGNOSIS — J849 Interstitial pulmonary disease, unspecified: Secondary | ICD-10-CM

## 2013-04-07 MED ORDER — PREDNISONE 20 MG PO TABS: 10.0000 mg | ORAL_TABLET | Freq: Every day | ORAL | Status: DC

## 2013-04-07 NOTE — Patient Instructions (Signed)
Please decrease prednisone to 10 mg daily Continue Plaquenil We will plan to repeat your CT scan of the chest in one year or sooner if you have any problems We will arrange for full pulmonary function testing Dr. Delton Coombes will discuss your medications with Dr. Nickola Major Follow with Dr Delton Coombes in 3 months or sooner if you have any problems.

## 2013-04-07 NOTE — Assessment & Plan Note (Signed)
Improvement on CT scan of the chest and also clinically. He has seen Dr. Nickola Major and plan we will hopefully be to decrease prednisone to 0 and substitute an alternative in addition to Plaquenil. Will discuss the plan with Dr. Nickola Major and notify the patient. Full PFT before next visit.

## 2013-04-07 NOTE — Progress Notes (Signed)
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 Discharged on Pred down to 40mg , plaquanil. He was treated for CAP (completed). Appearance on CT also suspicious for possible atypical infxn. He has had lung bx in [place] 07/2012. Needs local Rheumatologist. Feels better.   ROV 04/07/13 -- follow up for SLE with associated ILD and pericarditis. Last time we reduced prednisone to 20 mg daily and continued his Plaquenil. He has undergone repeat CT scan of the chest on 03/18/13, show improvement in parenchymal disease, L effusion. His breathing is improved.   Filed Vitals:   04/07/13 1338  BP: 120/70  Pulse: 58  Height: 6' (1.829 m)  Weight: 208 lb 12.8 oz (94.711 kg)  SpO2: 96%   Gen: Pleasant, well-nourished, in no distress,  normal affect  ENT: No lesions,  mouth clear,  oropharynx clear, no postnasal drip  Neck: No JVD, no TMG, no carotid bruits  Lungs: No use of accessory muscles, no dullness to percussion, clear without rales or rhonchi  Cardiovascular: RRR, heart sounds normal, no murmur or gallops, no peripheral edema  Musculoskeletal: No deformities, no cyanosis or clubbing  Neuro: alert, non focal  Skin: Warm, no lesions or rashes   ILD (interstitial lung disease) Improvement on CT scan of the chest and also clinically. He has seen Dr. Nickola Major and plan we will hopefully be to decrease prednisone to 0 and substitute an alternative in addition to Plaquenil. Will discuss the plan with Dr. Nickola Major and notify the patient. Full PFT before next visit.

## 2013-04-08 ENCOUNTER — Telehealth: Payer: Self-pay | Admitting: Emergency Medicine

## 2013-04-08 NOTE — Telephone Encounter (Signed)
Left pt a message regarding the plans for his medications to be adjusted by Dr Nickola Major. Dr Nickola Major' office is going to contact him.

## 2013-04-08 NOTE — Telephone Encounter (Signed)
Reviewed case w Dr Nickola Major. Will plan to add azathioprine to plaquanil, taper down prednisone. She will manage the immunosuppression. I will get PFT to establish baseline, plan to repeat CT scan in a year or sooner if he develops sx.

## 2013-04-09 ENCOUNTER — Encounter: Payer: Self-pay | Admitting: Cardiovascular Disease

## 2013-04-09 ENCOUNTER — Ambulatory Visit (INDEPENDENT_AMBULATORY_CARE_PROVIDER_SITE_OTHER): Payer: Federal, State, Local not specified - PPO | Admitting: Cardiovascular Disease

## 2013-04-09 VITALS — BP 141/74 | HR 52 | Ht 72.0 in | Wt 205.4 lb

## 2013-04-09 DIAGNOSIS — I309 Acute pericarditis, unspecified: Secondary | ICD-10-CM

## 2013-04-09 DIAGNOSIS — I2510 Atherosclerotic heart disease of native coronary artery without angina pectoris: Secondary | ICD-10-CM | POA: Insufficient documentation

## 2013-04-09 NOTE — Assessment & Plan Note (Signed)
Has a history of myocardial infarction and stenting in the past. I'll see him again in one year. We'll check fasting lipid profile, liver enzymes, and basic metabolic profile at his next visit. We'll also get an EKG.

## 2013-04-09 NOTE — Progress Notes (Signed)
     Cody Arellano Date of Birth  1946-02-06       [facility] Office [address]  [address] [place], [place]  [address]   [place], [place]  [address] [phone]     [phone]   Fax  [phone]    Fax [phone]  Problem List: 1. CAD - mi in 2008 2. Lupus 3. Pericarditis 4. Hypertension 5. Hyperlipidemia   History of Present Illness:    Current Outpatient Prescriptions on File Prior to Visit  Medication Sig Dispense Refill  . clopidogrel (PLAVIX) 75 MG tablet Take 75 mg by mouth daily.      . hydroxychloroquine (PLAQUENIL) 200 MG tablet Take 200 mg by mouth at bedtime.      [name] lisinopril (PRINIVIL,ZESTRIL) 2.5 MG tablet Take 2.5 mg by mouth daily.      [name] lovastatin (MEVACOR) 20 MG tablet Take 20 mg by mouth at bedtime.      . metoprolol tartrate (LOPRESSOR) 25 MG tablet Take 25 mg by mouth 2 (two) times daily.      . predniSONE (DELTASONE) 20 MG tablet Take 0.5 tablets (10 mg total) by mouth daily with breakfast.  30 tablet  5   No current facility-administered medications on file prior to visit.    No Known Allergies  Past Medical History  Diagnosis Date  . Lupus   . Coronary artery disease 2008/2009    MI with PCI x 2, then PCI x 1 in 2009  . Myocardial infarction 2008  . Dyslipidemia   . Hypertension   . Dysrhythmia     atrial fibrillation  . Lupus pericarditis   . Lupus disease of the lung   . Shortness of breath     Past Surgical History  Procedure Laterality Date  . Coronary stents    . Cardiac catheterization      History  Smoking status  . Former Smoker -- 1.00 packs/day for 20 years  . Types: Cigarettes  . Quit date: 02/26/1993  Smokeless tobacco  . Never Used    Comment: QUIT SMOKING 20 YEARS AGO    History  Alcohol Use No    No family history on file.  Reviw of Systems:  Reviewed in the HPI.  All other systems are negative.  Physical Exam: Blood pressure 141/74, pulse 52,  height 6' (1.829 m), weight 205 lb 6.4 oz (93.169 kg). General: Well developed, well nourished, in no acute distress.  Head: Normocephalic, atraumatic, sclera non-icteric, mucus membranes are moist,   Neck: Supple. Carotids are 2 + without bruits. No JVD   Lungs: Clear   Heart: RR , normal S1, s2  Abdomen: Soft, non-tender, non-distended with normal bowel sounds.  Msk:  Strength and tone are normal   Extremities: No clubbing or cyanosis. No edema.  Distal pedal pulses are 2+ and equal    Neuro: CN II - XII intact.  Alert and oriented X 3.   Psych:  Normal   ECG: Nov. 6, 2014:  Marked sinus brady at 48.  Inf mi  Assessment / Plan:

## 2013-04-09 NOTE — Assessment & Plan Note (Signed)
His lupus pericarditis seems to have resolved. He's not having any further episodes of chest pain. He also is not having any arrhythmias. We'll continue with the same medications.

## 2013-04-09 NOTE — Patient Instructions (Signed)
Your physician wants you to follow-up in: 1 year  You will receive a reminder letter in the mail two months in advance. If you don't receive a letter, please call our office to schedule the follow-up appointment.  Your physician recommends that you return for a FASTING lipid profile: 1 year   Your physician recommends that you continue on your current medications as directed. Please refer to the Current Medication list given to you today.    

## 2013-04-16 ENCOUNTER — Encounter: Payer: Federal, State, Local not specified - PPO | Admitting: Cardiovascular Disease

## 2013-05-13 ENCOUNTER — Ambulatory Visit (INDEPENDENT_AMBULATORY_CARE_PROVIDER_SITE_OTHER): Payer: Federal, State, Local not specified - PPO | Admitting: Internal Medicine

## 2013-05-13 ENCOUNTER — Encounter: Payer: Self-pay | Admitting: Gastroenterology

## 2013-05-13 ENCOUNTER — Encounter: Payer: Self-pay | Admitting: Internal Medicine

## 2013-05-13 VITALS — BP 110/70 | HR 58 | Temp 97.9°F | Ht 71.0 in | Wt 209.0 lb

## 2013-05-13 DIAGNOSIS — M329 Systemic lupus erythematosus, unspecified: Secondary | ICD-10-CM

## 2013-05-13 DIAGNOSIS — K635 Polyp of colon: Secondary | ICD-10-CM | POA: Insufficient documentation

## 2013-05-13 DIAGNOSIS — I2510 Atherosclerotic heart disease of native coronary artery without angina pectoris: Secondary | ICD-10-CM

## 2013-05-13 DIAGNOSIS — D126 Benign neoplasm of colon, unspecified: Secondary | ICD-10-CM

## 2013-05-13 DIAGNOSIS — F39 Unspecified mood [affective] disorder: Secondary | ICD-10-CM

## 2013-05-13 NOTE — Assessment & Plan Note (Signed)
This is quiet Maintained on meds for secondary prevention

## 2013-05-13 NOTE — Progress Notes (Signed)
Pre-visit discussion using our clinic review tool. No additional management support is needed unless otherwise documented below in the visit note.  

## 2013-05-13 NOTE — Assessment & Plan Note (Signed)
Mostly adjustment to move and illness Prednisone may be adding to this Not MDD No meds Should improve on its own

## 2013-05-13 NOTE — Assessment & Plan Note (Signed)
Lung and pericardial involvement Now on azathioprine and prednisone is weaning Dr Nickola Major is managing--- sees Dr Delton Coombes for the lungs (sounds clear now)

## 2013-05-13 NOTE — Assessment & Plan Note (Signed)
Due for colonoscopy Will need records from [place]

## 2013-05-13 NOTE — Progress Notes (Signed)
Subjective:    Patient ID: Cody Arellano, male    DOB: 14-Jun-1945, 67 y.o.   MRN: [id number]  HPI Moved here from near [place] in August Loves it here so far  Has SLE---recent hospitalization for lupus pericarditis Now seeing Dr Zenovia Jordan Still on prednisone but weaning  Just started on azathiprine Has lupus lung disease as well--interstitial lung disease  History of CAD Had stents in 2009 and another in 2010 Has been under control--- BP and cholesterol only as secondary prevention  History of colon polyps On 5 year recall and due now  Current Outpatient Prescriptions on File Prior to Visit  Medication Sig Dispense Refill  . clopidogrel (PLAVIX) 75 MG tablet Take 75 mg by mouth daily.      . hydroxychloroquine (PLAQUENIL) 200 MG tablet Take 200 mg by mouth at bedtime.      [name] lisinopril (PRINIVIL,ZESTRIL) 2.5 MG tablet Take 2.5 mg by mouth daily.      [name] lovastatin (MEVACOR) 20 MG tablet Take 20 mg by mouth at bedtime.      . metoprolol tartrate (LOPRESSOR) 25 MG tablet Take 25 mg by mouth 2 (two) times daily.      . predniSONE (DELTASONE) 20 MG tablet Take 0.5 tablets (10 mg total) by mouth daily with breakfast.  30 tablet  5   No current facility-administered medications on file prior to visit.    No Known Allergies  Past Medical History  Diagnosis Date  . Lupus     sees Dr Nickola Major  . Coronary artery disease 2008/2009    MI with PCI x 2, then PCI x 1 in 2009  . Myocardial infarction 2008  . Dyslipidemia   . Hypertension   . Dysrhythmia     atrial fibrillation  . Lupus pericarditis   . Lupus disease of the lung   . Shortness of breath   . Colon polyps     Past Surgical History  Procedure Laterality Date  . Coronary stents  2009  . Cardiac catheterization      Family History  Problem Relation Age of Onset  . Heart disease Mother   . Alcohol abuse Father   . Hyperlipidemia Sister   . Hypertension Sister   . Hyperlipidemia Brother   . Hypertension  Brother   . Diabetes Paternal Uncle   . Cancer Brother     ?lung cancer    History   Social History  . Marital Status: Married    Spouse Name: N/A    Number of Children: 5  . Years of Education: N/A   Occupational History  . 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 Smoking status: Former Smoker -- 1.00 packs/day for 20 years    Types: Cigarettes    Quit date: 02/26/1993  . Smokeless tobacco: Never Used     Comment: QUIT SMOKING 20 YEARS AGO  . Alcohol Use: No  . Drug Use: No  . Sexual Activity: Not on file   Other Topics Concern  . Not on file   Social History Narrative   No living will   Wife should make health care decisions   Would accept resuscitation   Not sure about tube feeds   Review of Systems  Constitutional: Positive for unexpected weight change. Negative for fatigue.       Tries to walk regularly Weight up on prednisone  HENT: Positive for hearing loss. Negative for dental problem.  Eyes: Negative for visual disturbance.       Regular exams for plaquenil  Respiratory: Positive for cough and shortness of breath.        Regular single coughs  Cardiovascular: Negative for chest pain, palpitations and leg swelling.  Gastrointestinal: Negative for nausea, vomiting, constipation and blood in stool.       Rare heartburn--- might try tums (effective)    Genitourinary: Positive for urgency.       Nocturia x 1 usually Occasional daytime urgency  Musculoskeletal: Negative for arthralgias, back pain, joint swelling and myalgias.  Skin: Negative for rash.  Allergic/Immunologic: Positive for immunocompromised state. Negative for environmental allergies.  Neurological: Negative for dizziness, syncope, weakness, light-headedness and headaches.  Hematological: Negative for adenopathy. Does not bruise/bleed easily.  Psychiatric/Behavioral: Positive for sleep disturbance and dysphoric mood. The patient is not  nervous/anxious.        Will awaken at 3AM---eventually able to get back to sleep Down feelings intermittently ----adjusting to move and illness       Objective:   Physical Exam  Constitutional: He appears well-developed and well-nourished. No distress.  HENT:  Mouth/Throat: Oropharynx is clear and moist. No oropharyngeal exudate.  Neck: Normal range of motion. Neck supple. No thyromegaly present.  Cardiovascular: Normal rate, regular rhythm and normal heart sounds.  Exam reveals no gallop and no friction rub.   No murmur heard. Pulmonary/Chest: Effort normal and breath sounds normal. No respiratory distress. He has no wheezes. He has no rales.  Abdominal: Soft. There is no tenderness.  Musculoskeletal: He exhibits no edema and no tenderness.  Lymphadenopathy:    He has no cervical adenopathy.  Skin: No rash noted.  Psychiatric: He has a normal mood and affect. His behavior is normal.          Assessment & Plan:

## 2013-05-20 ENCOUNTER — Encounter: Payer: Self-pay | Admitting: Internal Medicine

## 2013-05-20 ENCOUNTER — Telehealth: Payer: Self-pay | Admitting: Gastroenterology

## 2013-05-20 ENCOUNTER — Encounter: Payer: Self-pay | Admitting: Gastroenterology

## 2013-05-20 ENCOUNTER — Ambulatory Visit (INDEPENDENT_AMBULATORY_CARE_PROVIDER_SITE_OTHER): Payer: Federal, State, Local not specified - PPO | Admitting: Gastroenterology

## 2013-05-20 VITALS — BP 100/50 | HR 68 | Ht 72.0 in | Wt 210.0 lb

## 2013-05-20 DIAGNOSIS — Z860100 Personal history of colon polyps, unspecified: Secondary | ICD-10-CM | POA: Insufficient documentation

## 2013-05-20 MED ORDER — MOVIPREP 100 G PO SOLR: ORAL | Status: DC

## 2013-05-20 NOTE — Patient Instructions (Signed)
You have been scheduled for a colonoscopy with propofol. Please follow written instructions given to you at your visit today.  Please pick up your prep kit at the pharmacy within the next 1-3 days. If you use inhalers (even only as needed), please bring them with you on the day of your procedure. Your physician has requested that you go to www.startemmi.com and enter the access code given to you at your visit today. This web site gives a general overview about your procedure. However, you should still follow specific instructions given to you by our office regarding your preparation for the procedure.   We have sent the following medications to your pharmacy for you to pick up at your convenience: Moviprep; you were given instructions today at your office visit today                                               We are excited to introduce MyChart, a new best-in-class service that provides you online access to important information in your electronic medical record. We want to make it easier for you to view your health information - all in one secure location - when and where you need it. We expect MyChart will enhance the quality of care and service we provide.  When you register for MyChart, you can:    View your test results.    Request appointments and receive appointment reminders via email.    Request medication renewals.    View your medical history, allergies, medications and immunizations.    Communicate with your physician's office through a password-protected site.    Conveniently print information such as your medication lists.  To find out if MyChart is right for you, please talk to a member of our clinical staff today. We will gladly answer your questions about this free health and wellness tool.  If you are age 59 or older and want a member of your family to have access to your record, you must provide written consent by completing a proxy form available at our office.  Please speak to our clinical staff about guidelines regarding accounts for patients younger than age 26.  As you activate your MyChart account and need any technical assistance, please call the MyChart technical support line at [phone]) or email your question to [email or web address]. If you email your question(s), please include your name, a return phone number and the best time to reach you.  If you have non-urgent health-related questions, you can send a message to our office through MyChart at [email or web address]. If you have a medical emergency, call 911.  Thank you for using MyChart as your new health and wellness resource!   MyChart licensed from [employer],  4540-9811. Patents Pending.

## 2013-05-20 NOTE — Telephone Encounter (Signed)
I will forward to Dr Nahser to advise. 

## 2013-05-20 NOTE — Progress Notes (Signed)
05/20/2013 Cody Arellano [id number] 18-Sep-1945   HISTORY OF PRESENT ILLNESS:  Patient is a pleasant 67 year old male who presents to our office today to schedule a surveillance colonoscopy.  He recently moved here from [place] just 3 months ago.  He says that he had two colonoscopies in the past, the last one 5 years ago, and says that he had polyps on both colonoscopies and was told to have a repeat colonoscopies in 5 years from that time.  He cannot remember any of the contact information for the practice where he had his previous colonoscopies, but will try to get information for [place] so that we can get his records.  He does not have any complaints at this time.  No family history of colon cancer.  He is in Plavix for previous cardiac stent placements in 2008/2009 and sees Dr. Elease Hashimoto for his cardiologist.    Past Medical History  Diagnosis Date  . Lupus     sees Dr Nickola Major  . Coronary artery disease 2008/2009    MI with PCI x 2, then PCI x 1 in 2009  . Myocardial infarction 2008  . Dyslipidemia   . Hypertension   . Dysrhythmia     atrial fibrillation  . Lupus pericarditis   . Lupus disease of the lung   . Shortness of breath   . Colon polyps    Past Surgical History  Procedure Laterality Date  . Coronary stents  2009  . Cardiac catheterization      reports that he quit smoking about 20 years ago. His smoking use included Cigarettes. He has a 20 pack-year smoking history. He has never used smokeless tobacco. He reports that he does not drink alcohol or use illicit drugs. family history includes Alcohol abuse in his father; Cancer in his brother; Diabetes in his paternal uncle; Heart disease in his mother; Hyperlipidemia in his brother and sister; Hypertension in his brother and sister. No Known Allergies    Outpatient Encounter Prescriptions as of 05/20/2013  Medication Sig  . azaTHIOprine (IMURAN) 50 MG tablet Take 25 mg by mouth daily.  . clopidogrel (PLAVIX) 75 MG tablet Take 75  mg by mouth daily.  . hydroxychloroquine (PLAQUENIL) 200 MG tablet Take 200 mg by mouth at bedtime.  [name] lisinopril (PRINIVIL,ZESTRIL) 2.5 MG tablet Take 2.5 mg by mouth daily.  [name] lovastatin (MEVACOR) 20 MG tablet Take 20 mg by mouth at bedtime.  . metoprolol tartrate (LOPRESSOR) 25 MG tablet Take 25 mg by mouth 2 (two) times daily.  . predniSONE (DELTASONE) 20 MG tablet Take 0.5 tablets (10 mg total) by mouth daily with breakfast.     REVIEW OF SYSTEMS  : All other systems reviewed and negative except where noted in the History of Present Illness.   PHYSICAL EXAM: BP 100/50  Pulse 68  Ht 6' (1.829 m)  Wt 210 lb (95.255 kg)  BMI 28.47 kg/m2 General: Well developed black male in no acute distress Head: Normocephalic and atraumatic Eyes:  Sclerae anicteric, conjunctiva pink. Ears: Normal auditory acuity.  Lungs: Clear throughout to auscultation Heart: Regular rate and rhythm Abdomen: Soft, non-distended. No masses or hepatomegaly noted. Normal bowel sounds.  Non-tender. Rectal:  Deferred.  Will be performed at the time of colonoscopy. Musculoskeletal: Symmetrical with no gross deformities  Skin: No lesions on visible extremities Extremities: No edema  Neurological: Alert oriented x 4, grossly non-focal. Psychological:  Alert and cooperative. Normal mood and affect  ASSESSMENT AND PLAN: -Previous history of colon polyps:  Patient does not know the contact information for the facility in [place] where he had his previous colonoscopies.  He will try to get that information for [place] so that we can get his records, but in the meantime will schedule for his surveillance colonoscopy.  He says that he's had polyps on his two prior colonoscopies and was told to have a repeat procedure in 5 years from the last.  The risks, benefits, and alternatives were discussed with the patient and he consents to proceed.  The risks benefits and alternatives to a temporary hold of anti-coagulants/anti-platelets  for the procedure were discussed with the patient he consents to proceed. Obtain clearance from Dr. Elease Hashimoto.

## 2013-05-20 NOTE — Progress Notes (Signed)
Agree with initial assessment and plans. It appears, based on the available information, that surveillance colonoscopy is appropriate at this time. Issues regarding Plavix management to be addressed with his cardiologist.

## 2013-05-20 NOTE — Telephone Encounter (Signed)
  05/20/2013   RE: Cody Arellano DOB: 04-22-1946 MRN: [id number]   Dear Dr. Melburn Popper,    We have scheduled the above patient for an endoscopic procedure. Our records show that he is on anticoagulation therapy.   Please advise as to how long the patient may come off his therapy of Plavix prior to the procedure, which is scheduled for 06/03/2013.  Please fax back/ or route the completed form to Dashanti Burr/Pam at [phone].   Sincerely,    [facility] Carie Caddy. Pyrtle  M.D.

## 2013-05-24 NOTE — Telephone Encounter (Signed)
He may hold his plavix for 7 days prior to GI procedure.

## 2013-05-25 ENCOUNTER — Telehealth: Payer: Self-pay | Admitting: Gastroenterology

## 2013-05-25 NOTE — Telephone Encounter (Signed)
lvm for pt giving Dr. Harvie Bridge recommendations to hold plavix 7 days prior to procedure.

## 2013-06-01 NOTE — Telephone Encounter (Signed)
This appears to be a patient for colonoscopy with Dr. Marina Goodell on Wednesday, 12/31 I will copy him for his info.

## 2013-06-03 ENCOUNTER — Ambulatory Visit (AMBULATORY_SURGERY_CENTER): Payer: Federal, State, Local not specified - PPO | Admitting: Internal Medicine

## 2013-06-03 ENCOUNTER — Encounter: Payer: Self-pay | Admitting: Internal Medicine

## 2013-06-03 VITALS — BP 147/64 | HR 46 | Temp 96.9°F | Resp 19 | Ht 72.0 in | Wt 210.0 lb

## 2013-06-03 DIAGNOSIS — D126 Benign neoplasm of colon, unspecified: Secondary | ICD-10-CM

## 2013-06-03 DIAGNOSIS — Z86010 Personal history of colonic polyps: Secondary | ICD-10-CM

## 2013-06-03 MED ORDER — SODIUM CHLORIDE 0.9 % IV SOLN: 500.0000 mL | INTRAVENOUS | Status: DC

## 2013-06-03 NOTE — Patient Instructions (Addendum)
YOU HAD AN ENDOSCOPIC PROCEDURE TODAY AT [facility]: Refer to the procedure report that was given to you for any specific questions about what was found during the examination.  If the procedure report does not answer your questions, please call your gastroenterologist to clarify.  If you requested that your care partner not be given the details of your procedure findings, then the procedure report has been included in a sealed envelope for you to review at your convenience later.  YOU SHOULD EXPECT: Some feelings of bloating in the abdomen. Passage of more gas than usual.  Walking can help get rid of the air that was put into your GI tract during the procedure and reduce the bloating. If you had a lower endoscopy (such as a colonoscopy or flexible sigmoidoscopy) you may notice spotting of blood in your stool or on the toilet paper. If you underwent a bowel prep for your procedure, then you may not have a normal bowel movement for a few days.  DIET: Your first meal following the procedure should be a light meal and then it is ok to progress to your normal diet.  A half-sandwich or bowl of soup is an example of a good first meal.  Heavy or fried foods are harder to digest and may make you feel nauseous or bloated.  Likewise meals heavy in dairy and vegetables can cause extra gas to form and this can also increase the bloating.  Drink plenty of fluids but you should avoid alcoholic beverages for 24 hours.  ACTIVITY: Your care partner should take you home directly after the procedure.  You should plan to take it easy, moving slowly for the rest of the day.  You can resume normal activity the day after the procedure however you should NOT DRIVE or use heavy machinery for 24 hours (because of the sedation medicines used during the test).    SYMPTOMS TO REPORT IMMEDIATELY: A gastroenterologist can be reached at any hour.  During normal business hours, 8:30 AM to 5:00 PM Monday through Friday,  call [phone].  After hours and on weekends, please call the GI answering service at [phone]  Emergency number who will take a message and have the physician on call contact you.   Following lower endoscopy (colonoscopy or flexible sigmoidoscopy):  Excessive amounts of blood in the stool  Significant tenderness or worsening of abdominal pains  Swelling of the abdomen that is new, acute  Fever of 100F or higher FOLLOW UP: If any biopsies were taken you will be contacted by phone or by letter within the next 1-3 weeks.  Call your gastroenterologist if you have not heard about the biopsies in 3 weeks.  Our staff will call the home number listed on your records the next business day following your procedure to check on you and address any questions or concerns that you may have at that time regarding the information given to you following your procedure. This is a courtesy call and so if there is no answer at the home number and we have not heard from you through the emergency physician on call, we will assume that you have returned to your regular daily activities without incident.  SIGNATURES/CONFIDENTIALITY: You and/or your care partner have signed paperwork which will be entered into your electronic medical record.  These signatures attest to the fact that that the information above on your After Visit Summary has been reviewed and is understood.  Full responsibility of the confidentiality  of this discharge information lies with you and/or your care-partner.  Handouts on polyps, diverticulosis, high fiber diet Repeat colon in 5 years 2019 Resume plavix today as directed

## 2013-06-03 NOTE — Op Note (Signed)
[facility] [address] [place], [address]   COLONOSCOPY PROCEDURE REPORT  PATIENT: Cody Arellano, Cody Arellano  MR#: [id number] BIRTHDATE: 1945/12/29 , 67  yrs. old GENDER: Male ENDOSCOPIST: Roxy Cedar, MD REFERRED XB:[id number] Alphonsus Sias, M.D. PROCEDURE DATE:  06/03/2013 PROCEDURE:   Colonoscopy with snare polypectomy x 1 First Screening Colonoscopy - Avg.  risk and is 50 yrs.  old or older - No.  Prior Negative Screening - Now for repeat screening. N/A  History of Adenoma - Now for follow-up colonoscopy & has been > or = to 3 yrs.  Yes hx of adenoma.  Has been 3 or more years since last colonoscopy.  Polyps Removed Today? Yes. ASA CLASS:   Class III INDICATIONS:Patient's personal history of colon polyps.   [place] several colonoscopies w/ "polyps"; Last 5 yrs ago; told due now (no records) MEDICATIONS: MAC sedation, administered by CRNA and propofol (Diprivan) 200mg  IV DESCRIPTION OF PROCEDURE:   After the risks benefits and alternatives of the procedure were thoroughly explained, informed consent was obtained.  A digital rectal exam revealed no abnormalities of the rectum.   The LB [id number]  endoscope was introduced through the anus and advanced to the cecum, which was identified by both the appendix and ileocecal valve. No adverse events experienced.   The quality of the prep was adequate, using MoviPrep  The instrument was then slowly withdrawn as the colon was fully examined.  COLON FINDINGS: A diminutive polyp was found at the cecum.  A polypectomy was performed with a cold snare.  The resection was complete and the polyp tissue was completely retrieved.   Moderate diverticulosis was noted The finding was in the right colon.   The colon mucosa was otherwise normal.  Retroflexed views revealed internal hemorrhoids. The time to cecum=4 minutes 50 seconds. Withdrawal time=12 minutes 24 seconds.  The scope was withdrawn and the procedure  completed. COMPLICATIONS: There were no complications.  ENDOSCOPIC IMPRESSION: 1.   Diminutive polyp was found at the cecum; polypectomy was performed with a cold snare 2.   Moderate diverticulosis was noted in the right colon 3.   The colon mucosa was otherwise normal  RECOMMENDATIONS: 1. Follow up colonoscopy in 5 years   eSigned:  Roxy Cedar, MD 06/03/2013 9:05 AM   cc: Karie Schwalbe, MD and The Patient

## 2013-06-03 NOTE — Progress Notes (Signed)
Called to room to assist during endoscopic procedure.  Patient ID and intended procedure confirmed with present staff. Received instructions for my participation in the procedure from the performing physician.  

## 2013-06-03 NOTE — Progress Notes (Signed)
Patient did not experience any of the following events: a burn prior to discharge; a fall within the facility; wrong site/side/patient/procedure/implant event; or a hospital transfer or hospital admission upon discharge from the facility. (G8907) Patient did not have preoperative order for IV antibiotic SSI prophylaxis. (G8918)  

## 2013-06-05 ENCOUNTER — Telehealth: Payer: Self-pay

## 2013-06-05 NOTE — Telephone Encounter (Signed)
  Follow up Call-  Call back number 06/03/2013  Post procedure Call Back phone  # [phone]  Permission to leave phone message Yes     Patient questions:  Do you have a fever, pain , or abdominal swelling? no Pain Score  0 *  Have you tolerated food without any problems? yes  Have you been able to return to your normal activities? yes  Do you have any questions about your discharge instructions: Diet   no Medications  no Follow up visit  no  Do you have questions or concerns about your Care? no  Actions: * If pain score is 4 or above: No action needed, pain <4.

## 2013-06-08 ENCOUNTER — Encounter: Payer: Self-pay | Admitting: Internal Medicine

## 2013-12-28 ENCOUNTER — Telehealth: Payer: Self-pay

## 2013-12-28 DIAGNOSIS — H9193 Unspecified hearing loss, bilateral: Secondary | ICD-10-CM

## 2013-12-28 NOTE — Telephone Encounter (Signed)
Pt [place] hearing test and [place] in [place] prior to moving to [place] in 2014 that pt needed hearing aides. Pt [place] hearing [place] before [place]. Pt is continuing to have decreased hearing and would like referral to hearing specialist.pt request cb.

## 2014-02-02 ENCOUNTER — Telehealth: Payer: Self-pay | Admitting: Cardiovascular Disease

## 2014-02-02 NOTE — Telephone Encounter (Signed)
Spoke with patient who states he would like to schedule a stress test.  Patient states he has hx of CAD and has noticed increased SOB with activity recently.  Patient denies chest pain; states in previous heart attack he also did not have chest pain.  Patient states when he walks up a flight of stairs quickly it takes him a few minutes to catch his breath and after that he feels okay. States from past history he knows it is time to be evaluated for blockages and would like to have a stress test to verify.   I advised patient that he will need to be evaluated by Dr. Acie Fredrickson before ordering a stress test.  I scheduled patient for 9/8 and advised that if symptoms worsen that he should call 911 for immediate assistance.  Patient verbalized understanding and agreement.

## 2014-02-02 NOTE — Telephone Encounter (Signed)
New message          Pt is inquiring about a stress test / is this ok to schedule?

## 2014-02-09 ENCOUNTER — Encounter: Payer: Self-pay | Admitting: Cardiovascular Disease

## 2014-02-09 ENCOUNTER — Ambulatory Visit (INDEPENDENT_AMBULATORY_CARE_PROVIDER_SITE_OTHER): Payer: Federal, State, Local not specified - PPO | Admitting: Cardiovascular Disease

## 2014-02-09 VITALS — BP 100/68 | HR 52 | Ht 72.0 in | Wt 198.6 lb

## 2014-02-09 DIAGNOSIS — I2510 Atherosclerotic heart disease of native coronary artery without angina pectoris: Secondary | ICD-10-CM

## 2014-02-09 DIAGNOSIS — R0609 Other forms of dyspnea: Secondary | ICD-10-CM

## 2014-02-09 DIAGNOSIS — I25119 Atherosclerotic heart disease of native coronary artery with unspecified angina pectoris: Secondary | ICD-10-CM

## 2014-02-09 DIAGNOSIS — I32 Pericarditis in diseases classified elsewhere: Secondary | ICD-10-CM

## 2014-02-09 DIAGNOSIS — M329 Systemic lupus erythematosus, unspecified: Secondary | ICD-10-CM

## 2014-02-09 DIAGNOSIS — M3212 Pericarditis in systemic lupus erythematosus: Secondary | ICD-10-CM

## 2014-02-09 DIAGNOSIS — R0989 Other specified symptoms and signs involving the circulatory and respiratory systems: Secondary | ICD-10-CM

## 2014-02-09 DIAGNOSIS — I209 Angina pectoris, unspecified: Secondary | ICD-10-CM

## 2014-02-09 NOTE — Progress Notes (Signed)
Cody Arellano Date of Birth  1946/03/19       [facility] Office [address], [address], [place], [place]  [address]   [place], [place]  [address] [phone]     [phone]   Fax  [phone]    Fax [phone]  Problem List: 1. CAD - mi in 2008 2. Lupus 3. Pericarditis 4. Hypertension 5. Hyperlipidemia   History of Present Illness:  Sept. 8, 2015:  Cody Arellano was seen in Nov. 2014 for follow up of his Lupus pericarditis.  \ Cody Arellano has had significant worsening of DOE since I last saw him.   He has not had to take any nitroglycerin.   He does think the symptoms are very similar to when he had his MI.  He denies any PND orthopnea. He denies any pedal edema. He tries to avoid salt but it makes it he eats some salt on occasion.      Current Outpatient Prescriptions on File Prior to Visit  Medication Sig Dispense Refill  . azaTHIOprine (IMURAN) 50 MG tablet Take 25 mg by mouth daily.      . clopidogrel (PLAVIX) 75 MG tablet Take 75 mg by mouth daily.      . hydroxychloroquine (PLAQUENIL) 200 MG tablet Take 200 mg by mouth at bedtime.      Cody Arellano lisinopril (PRINIVIL,ZESTRIL) 2.5 MG tablet Take 2.5 mg by mouth daily.      Cody Arellano lovastatin (MEVACOR) 20 MG tablet Take 20 mg by mouth at bedtime.      . metoprolol tartrate (LOPRESSOR) 25 MG tablet Take 25 mg by mouth 2 (two) times daily.       No current facility-administered medications on file prior to visit.    No Known Allergies  Past Medical History  Diagnosis Date  . Lupus     sees Dr Trudie Reed  . Coronary artery disease 2008/2009    MI with PCI x 2, then PCI x 1 in 2009  . Myocardial infarction 2008  . Dyslipidemia   . Hypertension   . Dysrhythmia     atrial fibrillation  . Lupus pericarditis   . Lupus disease of the lung   . Shortness of breath   . Colon polyps     Past Surgical History  Procedure Laterality Date  . Coronary stents  2009  . Cardiac  catheterization      History  Smoking status  . Former Smoker -- 1.00 packs/day for 20 years  . Types: Cigarettes  . Quit date: 02/26/1993  Smokeless tobacco  . Never Used    Comment: QUIT SMOKING 20 YEARS AGO    History  Alcohol Use No    Family History  Problem Relation Age of Onset  . Heart disease Mother   . Alcohol abuse Father   . Hyperlipidemia Sister   . Hypertension Sister   . Hyperlipidemia Brother   . Hypertension Brother   . Diabetes Paternal Uncle   . Cancer Brother     ?lung cancer    Reviw of Systems:  Reviewed in the HPI.  All other systems are negative.  Physical Exam: Blood pressure 100/68, pulse 52, height 6' (1.829 m), weight 198 lb 9.6 oz (90.084 kg). General: Well developed, well nourished, in no acute distress.  Head: Normocephalic, atraumatic, sclera non-icteric, mucus membranes are moist,   Neck: Supple. Carotids are 2 + without bruits. No JVD   Lungs: Clear  Heart: RR , normal S1, s2  Abdomen: Soft, non-tender, non-distended with normal bowel sounds.  Msk:  Strength and tone are normal   Extremities: No clubbing or cyanosis. No edema.  Distal pedal pulses are 2+ and equal    Neuro: CN II - XII intact.  Alert and oriented X 3.   Psych:  Normal   ECG: 02/09/2014:  Sinus bradycardia at 52. He is possible left atrial enlargement. He has nonspecific T-wave abnormality.  Assessment / Plan:

## 2014-02-09 NOTE — Patient Instructions (Signed)
Your physician has requested that you have an echocardiogram. Echocardiography is a painless test that uses sound waves to create images of your heart. It provides your doctor with information about the size and shape of your heart and how well your heart's chambers and valves are working. This procedure takes approximately one hour. There are no restrictions for this procedure.  Your physician has requested that you have a lexiscan myoview. For further information please visit [email or web address]. Please follow instruction sheet, as given.  Your physician wants you to follow-up in 6 months with Dr. Acie Fredrickson. You will receive a reminder letter in the mail two months in advance. If you don't receive a letter, please call our office to schedule the follow-up appointment.  Your physician recommends that you continue on your current medications as directed. Please refer to the Current Medication list given to you today.

## 2014-02-09 NOTE — Assessment & Plan Note (Signed)
Number has a history of CAD and has had an MI in the past. Some of his current symptoms are very similar to his symptoms that he had prior to his MI in 2008. We will continue with his same medications. We'll check a [employer]

## 2014-02-09 NOTE — Assessment & Plan Note (Signed)
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  He is having significant DOE and we will get a repeat echo.

## 2014-02-16 ENCOUNTER — Ambulatory Visit (INDEPENDENT_AMBULATORY_CARE_PROVIDER_SITE_OTHER): Payer: Federal, State, Local not specified - PPO | Admitting: Podiatry

## 2014-02-16 ENCOUNTER — Encounter: Payer: Self-pay | Admitting: Podiatry

## 2014-02-16 VITALS — BP 137/69 | HR 51 | Resp 16 | Ht 72.0 in | Wt 195.0 lb

## 2014-02-16 DIAGNOSIS — M79676 Pain in unspecified toe(s): Secondary | ICD-10-CM

## 2014-02-16 DIAGNOSIS — B351 Tinea unguium: Secondary | ICD-10-CM

## 2014-02-16 DIAGNOSIS — M79609 Pain in unspecified limb: Secondary | ICD-10-CM

## 2014-02-16 MED ORDER — TAVABOROLE 5 % EX SOLN: 1.0000 [drp] | CUTANEOUS | Status: DC

## 2014-02-16 NOTE — Progress Notes (Signed)
   Subjective:    Patient ID: Daelyn Mozer, male    DOB: 01-10-1946, 68 y.o.   MRN: [id number]  HPI Comments: Mr. Wiler, 68 year old male presents to the office today for complaints of nail fungus. He states he previously saw a podiatrist in [place] and he had nail fungus and only 1 nail. At that time he did not undergo any treatment. Since then he states that he has had fungus on all of his nails and has been getting worse. Also states that his left second nail was very elongated and got caught on something and injured approximately one week ago. Nails are painful particularly in shoe gear as they are thickened and elongated. He is inquiring today about treatment options for nail fungus. No other complaints at this time.     Review of Systems  Constitutional: Positive for fatigue.  HENT: Positive for hearing loss.   Respiratory: Positive for cough.   All other systems reviewed and are negative.      Objective:   Physical Exam AAO x3, NAD DP/PT pulses palpable. CRT < 3sec. Pedal hair present Protective sensation intact the Semmes Weinstein monofilament, vibratory sensation intact, Achilles tendon reflex intact. Nails hypertrophic, dystrophic, elongated, yellow brownish discoloration, brittle x10. Left second digit nail with evidence of subungual hematoma on the proximal aspect of the nail however the nail appears to be firmly adhered to the nail bed. There is no surrounding erythema or drainage. No tenderness on palpation. MMT 5/5, ROM WNL No leg pain, swelling, warmth.     Assessment & Plan:  68 year old male with symptomatic onychomycosis. -Conservative versus surgical treatment discussed including alternatives, risks, complications. -Nail sharply debrided P79 without complications. -Discussed various treatment options for nail fungus. At this time the patient elects to proceed with topical therapy. He is unsure if he wants to proceed with a prescription topical versus over-the-counter  topical. I prescribed him [place] Mon and a prescription was sent to [facility] and he was given a conference information to followup of the pharmacy. If patient does not to go with this medication he can purchase an over-the-counter fungi nail. Side effects and medications discussed with the patient and directed to stop immediately if any are to occur and call the office. -Followup as needed. Call the office with any questions or concerns or change in symptoms. -Followup with PCP for other issues mentioned in the review of systems.

## 2014-02-16 NOTE — Patient Instructions (Signed)
Onychomycosis/Fungal Toenails  WHAT IS IT? An infection that lies within the keratin of your nail plate that is caused by a fungus.  WHY ME? Fungal infections affect all ages, sexes, races, and creeds.  There may be many factors that predispose you to a fungal infection such as age, coexisting medical conditions such as diabetes, or an autoimmune disease; stress, medications, fatigue, genetics, etc.  Bottom line: fungus thrives in a warm, moist environment and your shoes offer such a location.  IS IT CONTAGIOUS? Theoretically, yes.  You do not want to share shoes, nail clippers or files with someone who has fungal toenails.  Walking around barefoot in the same room or sleeping in the same bed is unlikely to transfer the organism.  It is important to realize, however, that fungus can spread easily from one nail to the next on the same foot.  HOW DO WE TREAT THIS?  There are several ways to treat this condition.  Treatment may depend on many factors such as age, medications, pregnancy, liver and kidney conditions, etc.  It is best to ask your doctor which options are available to you.  1. No treatment.   Unlike many other medical concerns, you can live with this condition.  However for many people this can be a painful condition and may lead to ingrown toenails or a bacterial infection.  It is recommended that you keep the nails cut short to help reduce the amount of fungal nail. 2. Topical treatment.  These range from herbal remedies to prescription strength nail lacquers.  About 40-50% effective, topicals require twice daily application for approximately 9 to 12 months or until an entirely new nail has grown out.  The most effective topicals are medical grade medications available through physicians offices. 3. Oral antifungal medications.  With an 80-90% cure rate, the most common oral medication requires 3 to 4 months of therapy and stays in your system for a year as the new nail grows out.  Oral  antifungal medications do require blood work to make sure it is a safe drug for you.  A liver function panel will be performed prior to starting the medication and after the first month of treatment.  It is important to have the blood work performed to avoid any harmful side effects.  In general, this medication safe but blood work is required. 4. Laser Therapy.  This treatment is performed by applying a specialized laser to the affected nail plate.  This therapy is noninvasive, fast, and non-painful.  It is not covered by insurance and is therefore, out of pocket.  The results have been very good with a 80-95% cure rate.  [facility] is the only practice in the area to offer this therapy. 5. Permanent Nail Avulsion.  Removing the entire nail so that a new nail will not grow back.   Over the counter treatment for fungi-nail

## 2014-02-17 ENCOUNTER — Ambulatory Visit (HOSPITAL_BASED_OUTPATIENT_CLINIC_OR_DEPARTMENT_OTHER): Payer: Federal, State, Local not specified - PPO | Admitting: Radiology

## 2014-02-17 ENCOUNTER — Ambulatory Visit (HOSPITAL_COMMUNITY): Payer: Federal, State, Local not specified - PPO | Attending: Cardiology | Admitting: Radiology

## 2014-02-17 VITALS — BP 146/89 | HR 53 | Ht 72.0 in | Wt 194.0 lb

## 2014-02-17 DIAGNOSIS — E785 Hyperlipidemia, unspecified: Secondary | ICD-10-CM | POA: Diagnosis not present

## 2014-02-17 DIAGNOSIS — I2510 Atherosclerotic heart disease of native coronary artery without angina pectoris: Secondary | ICD-10-CM | POA: Diagnosis not present

## 2014-02-17 DIAGNOSIS — I4891 Unspecified atrial fibrillation: Secondary | ICD-10-CM | POA: Insufficient documentation

## 2014-02-17 DIAGNOSIS — R072 Precordial pain: Secondary | ICD-10-CM | POA: Insufficient documentation

## 2014-02-17 DIAGNOSIS — I25119 Atherosclerotic heart disease of native coronary artery with unspecified angina pectoris: Secondary | ICD-10-CM

## 2014-02-17 DIAGNOSIS — M3212 Pericarditis in systemic lupus erythematosus: Secondary | ICD-10-CM

## 2014-02-17 DIAGNOSIS — R0609 Other forms of dyspnea: Secondary | ICD-10-CM

## 2014-02-17 DIAGNOSIS — R0989 Other specified symptoms and signs involving the circulatory and respiratory systems: Secondary | ICD-10-CM

## 2014-02-17 DIAGNOSIS — I10 Essential (primary) hypertension: Secondary | ICD-10-CM | POA: Diagnosis not present

## 2014-02-17 MED ORDER — REGADENOSON 0.4 MG/5ML IV SOLN
0.4000 mg | Freq: Once | INTRAVENOUS | Status: AC
  Administered 2014-02-17: 0.4 mg via INTRAVENOUS

## 2014-02-17 MED ORDER — TECHNETIUM TC 99M SESTAMIBI GENERIC - CARDIOLITE
11.0000 | Freq: Once | INTRAVENOUS | Status: AC | PRN
  Administered 2014-02-17: 11 via INTRAVENOUS

## 2014-02-17 MED ORDER — TECHNETIUM TC 99M SESTAMIBI GENERIC - CARDIOLITE
33.0000 | Freq: Once | INTRAVENOUS | Status: AC | PRN
Start: 2014-02-17 — End: 2014-02-17
  Administered 2014-02-17: 33 via INTRAVENOUS

## 2014-02-17 NOTE — Progress Notes (Signed)
[facility] [address] [place], [place] [address] [phone]    Cardiology Nuclear Med Study  Cody Arellano is a 68 y.o. male     MRN : [id number]     DOB: 1945/10/13  Procedure Date: 02/17/2014  Nuclear Med Background Indication for Stress Test:  Evaluation for Ischemia and Follow up CAD History:  CAD, MPI (normal per pt.), Afib Cardiac Risk Factors: Hypertension  Symptoms:  DOE   Nuclear Pre-Procedure Caffeine/Decaff Intake:  None NPO After: 7:00pm   Lungs:  clear O2 Sat: 95% on room air. IV 0.9% NS with Angio Cath:  22g  IV Site: R Hand  IV Started by:  Crissie Figures, RN  Chest Size (in):  44 Cup Size: n/a  Height: 6' (1.829 m)  Weight:  194 lb (87.998 kg)  BMI:  Body mass index is 26.31 kg/(m^2). Tech Comments:  N/A    Nuclear Med Study 1 or 2 day study: 1 day  Stress Test Type:  Lexiscan  Reading MD: N/A  Order Authorizing Provider:  Mertie Moores, MD  Resting Radionuclide: Technetium 15m Sestamibi  Resting Radionuclide Dose: 11.0 mCi   Stress Radionuclide:  Technetium 43m Sestamibi  Stress Radionuclide Dose: 33.0 mCi           Stress Protocol Rest HR: 53 Stress HR: 88  Rest BP: 146/89 Stress BP: 101/74  Exercise Time (min): n/a METS: n/a           Dose of Adenosine (mg):  n/a Dose of Lexiscan: 0.4 mg  Dose of Atropine (mg): n/a Dose of Dobutamine: n/a mcg/kg/min (at max HR)  Stress Test Technologist: Glade Lloyd, BS-ES  Nuclear Technologist:  Annye Rusk, CNMT     Rest Procedure:  Myocardial perfusion imaging was performed at rest 45 minutes following the intravenous administration of Technetium 23m Sestamibi. Rest ECG: NSR - Normal EKG  Stress Procedure:  The patient received IV Lexiscan 0.4 mg over 15-seconds with concurrent low level exercise and then Technetium 26m Sestamibi was injected at 30-seconds while the patient continued walking one more minute.  Quantitative spect images were obtained after a 45-minute delay.   During the infusion of Lexiscan the patient complained of nausea, lightheadedness and SOB.  These symptoms began to resolve in recovery.  Stress ECG: No significant change from baseline ECG  QPS Raw Data Images:  Normal; no motion artifact; normal heart/lung ratio. Stress Images:  Normal homogeneous uptake in all areas of the myocardium. Rest Images:  Normal homogeneous uptake in all areas of the myocardium. Subtraction (SDS):  There is no evidence of scar or ischemia. Transient Ischemic Dilatation (Normal <1.22):  0.89 Lung/Heart Ratio (Normal <0.45):  0.29  Quantitative Gated Spect Images QGS EDV:  90 ml QGS ESV:  39 ml  Impression Exercise Capacity:  Lexiscan with low level exercise. BP Response:  Normal blood pressure response. Clinical Symptoms:  Nausea, dyspnea.  ECG Impression:  No significant ST segment change suggestive of ischemia. Comparison with Prior Nuclear Study: No images to compare  Overall Impression:  Normal stress nuclear study.  LV Ejection Fraction: 56%.  LV Wall Motion:  NL LV Function; NL Wall Motion  Cody Arellano 02/17/2014

## 2014-02-17 NOTE — Progress Notes (Signed)
Echocardiogram performed.  

## 2014-02-18 ENCOUNTER — Telehealth: Payer: Self-pay | Admitting: Nurse Practitioner

## 2014-02-18 NOTE — Telephone Encounter (Signed)
Reviewed Myocardial Perfusion and ECHO results with patient who verbalized understanding.  Patient asked me to ask Dr. Acie Fredrickson if he can come off of some of his medications.  Patient is currently taking Plavix, Lovastatin, Metoprolol, and Lisinopril.  Patient states BP is in the normal range and he would like to take less medication if possible.  I advised patient I will send message to Dr. Acie Fredrickson for advice and will call him back with his reply.

## 2014-02-19 NOTE — Telephone Encounter (Signed)
The pt is advised and he verbalized understanding. The pt states that he will continue to monitor his BP and he will call us if he has any questions or concerns. Lisinopril has been removed from his med list.

## 2014-02-19 NOTE — Telephone Encounter (Signed)
His BP was low/normal. He may DC the lisinopril. He should continue to monitor his BP

## 2014-05-04 ENCOUNTER — Telehealth: Payer: Self-pay | Admitting: Cardiovascular Disease

## 2014-05-04 NOTE — Telephone Encounter (Signed)
New Message  Pt called requests a call back to discuss an alteration in his medications.. He wants to stop taking some of the medications he has been taking for 5 years or so. Please call.

## 2014-05-05 NOTE — Telephone Encounter (Signed)
Spoke with patient who would like Dr. Acie Fredrickson to consider taking him off of some of his medications.  Patient states he has been on the medications since his MI in 2008; states he never had hypertension, was just placed on Lisinopril and Metoprolol for protection and for heart rate control.  I advised patient that there is a phone message from September where patient was advised that he could stop Lisinopril per Dr. Acie Fredrickson.  Patient states he forgot and has continued to take Lisinopril.  I advised patient that he can go ahead and stop and continue to monitor BP.  Patient states he gets his BP checked about once a week at the pharmacy and it has remained below 120/80.  I advised patient that Dr. Acie Fredrickson may want to talk to him at next office visit before making any additional changes and I scheduled patient for follow-up March 8.  Patient verbalized understanding and agreement.

## 2014-05-06 NOTE — Telephone Encounter (Signed)
Agree with note form Christen Bame, RN

## 2014-05-11 ENCOUNTER — Ambulatory Visit (INDEPENDENT_AMBULATORY_CARE_PROVIDER_SITE_OTHER): Payer: Federal, State, Local not specified - PPO | Admitting: Internal Medicine

## 2014-05-11 ENCOUNTER — Encounter: Payer: Self-pay | Admitting: Internal Medicine

## 2014-05-11 VITALS — BP 124/68 | HR 70 | Temp 98.2°F | Wt 192.0 lb

## 2014-05-11 DIAGNOSIS — J069 Acute upper respiratory infection, unspecified: Secondary | ICD-10-CM

## 2014-05-11 MED ORDER — HYDROCODONE-HOMATROPINE 5-1.5 MG/5ML PO SYRP: 5.0000 mL | ORAL_SOLUTION | Freq: Three times a day (TID) | ORAL | Status: DC | PRN

## 2014-05-11 MED ORDER — AZITHROMYCIN 250 MG PO TABS: ORAL_TABLET | ORAL | Status: DC

## 2014-05-11 NOTE — Progress Notes (Signed)
Pre visit review using our clinic review tool, if applicable. No additional management support is needed unless otherwise documented below in the visit note. 

## 2014-05-11 NOTE — Patient Instructions (Signed)
Upper Respiratory Infection, Adult An upper respiratory infection (URI) is also sometimes known as the common cold. The upper respiratory tract includes the nose, sinuses, throat, trachea, and bronchi. Bronchi are the airways leading to the lungs. Most people improve within 1 week, but symptoms can last up to 2 weeks. A residual cough may last even longer.  CAUSES Many different viruses can infect the tissues lining the upper respiratory tract. The tissues become irritated and inflamed and often become very moist. Mucus production is also common. A cold is contagious. You can easily spread the virus to others by oral contact. This includes kissing, sharing a glass, coughing, or sneezing. Touching your mouth or nose and then touching a surface, which is then touched by another person, can also spread the virus. SYMPTOMS  Symptoms typically develop 1 to 3 days after you come in contact with a cold virus. Symptoms vary from person to person. They may include:  Runny nose.  Sneezing.  Nasal congestion.  Sinus irritation.  Sore throat.  Loss of voice (laryngitis).  Cough.  Fatigue.  Muscle aches.  Loss of appetite.  Headache.  Low-grade fever. DIAGNOSIS  You might diagnose your own cold based on familiar symptoms, since most people get a cold 2 to 3 times a year. Your caregiver can confirm this based on your exam. Most importantly, your caregiver can check that your symptoms are not due to another disease such as strep throat, sinusitis, pneumonia, asthma, or epiglottitis. Blood tests, throat tests, and X-rays are not necessary to diagnose a common cold, but they may sometimes be helpful in excluding other more serious diseases. Your caregiver will decide if any further tests are required. RISKS AND COMPLICATIONS  You may be at risk for a more severe case of the common cold if you smoke cigarettes, have chronic heart disease (such as heart failure) or lung disease (such as asthma), or if  you have a weakened immune system. The very young and very old are also at risk for more serious infections. Bacterial sinusitis, middle ear infections, and bacterial pneumonia can complicate the common cold. The common cold can worsen asthma and chronic obstructive pulmonary disease (COPD). Sometimes, these complications can require emergency medical care and may be life-threatening. PREVENTION  The best way to protect against getting a cold is to practice good hygiene. Avoid oral or hand contact with people with cold symptoms. Wash your hands often if contact occurs. There is no clear evidence that vitamin C, vitamin E, echinacea, or exercise reduces the chance of developing a cold. However, it is always recommended to get plenty of rest and practice good nutrition. TREATMENT  Treatment is directed at relieving symptoms. There is no cure. Antibiotics are not effective, because the infection is caused by a virus, not by bacteria. Treatment may include:  Increased fluid intake. Sports drinks offer valuable electrolytes, sugars, and fluids.  Breathing heated mist or steam (vaporizer or shower).  Eating chicken soup or other clear broths, and maintaining good nutrition.  Getting plenty of rest.  Using gargles or lozenges for comfort.  Controlling fevers with ibuprofen or acetaminophen as directed by your caregiver.  Increasing usage of your inhaler if you have asthma. Zinc gel and zinc lozenges, taken in the first 24 hours of the common cold, can shorten the duration and lessen the severity of symptoms. Pain medicines may help with fever, muscle aches, and throat pain. A variety of non-prescription medicines are available to treat congestion and runny nose. Your caregiver   can make recommendations and may suggest nasal or lung inhalers for other symptoms.  HOME CARE INSTRUCTIONS   Only take over-the-counter or prescription medicines for pain, discomfort, or fever as directed by your  caregiver.  Use a warm mist humidifier or inhale steam from a shower to increase air moisture. This may keep secretions moist and make it easier to breathe.  Drink enough water and fluids to keep your urine clear or pale yellow.  Rest as needed.  Return to work when your temperature has returned to normal or as your caregiver advises. You may need to stay home longer to avoid infecting others. You can also use a face mask and careful hand washing to prevent spread of the virus. SEEK MEDICAL CARE IF:   After the first few days, you feel you are getting worse rather than better.  You need your caregiver's advice about medicines to control symptoms.  You develop chills, worsening shortness of breath, or brown or red sputum. These may be signs of pneumonia.  You develop yellow or brown nasal discharge or pain in the face, especially when you bend forward. These may be signs of sinusitis.  You develop a fever, swollen neck glands, pain with swallowing, or white areas in the back of your throat. These may be signs of strep throat. SEEK IMMEDIATE MEDICAL CARE IF:   You have a fever.  You develop severe or persistent headache, ear pain, sinus pain, or chest pain.  You develop wheezing, a prolonged cough, cough up blood, or have a change in your usual mucus (if you have chronic lung disease).  You develop sore muscles or a stiff neck. Document Released: 11/14/2000 Document Revised: 08/13/2011 Document Reviewed: 08/26/2013 Exit[employer] Patient Information 2015 [employer]. This information is not intended to replace advice given to you by your health care provider. Make sure you discuss any questions you have with your health care provider.  

## 2014-05-11 NOTE — Progress Notes (Signed)
HPI  Pt presents to the clinic today with c/o nasal congestion, cough and chest congestion. He reports this started 1 week ago. The cough is productive of thick green/grey mucous. He denies fever, chills or body aches. He has tried Robitussin without much relief. He has no history of allergies or breathing problems. He has not had sick contacts.  Review of Systems      Past Medical History  Diagnosis Date  . Lupus     sees Dr Trudie Reed  . Coronary artery disease 2008/2009    MI with PCI x 2, then PCI x 1 in 2009  . Myocardial infarction 2008  . Dyslipidemia   . Hypertension   . Dysrhythmia     atrial fibrillation  . Lupus pericarditis   . Lupus disease of the lung   . Shortness of breath   . Colon polyps     Family History  Problem Relation Age of Onset  . Heart disease Mother   . Alcohol abuse Father   . Hyperlipidemia Sister   . Hypertension Sister   . Hyperlipidemia Brother   . Hypertension Brother   . Diabetes Paternal Uncle   . Cancer Brother     ?lung cancer    History   Social History  . Marital Status: Married    Spouse Name: N/A    Number of Children: 69  . Years of Education: N/A   Occupational History  . 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 Smoking status: Former Smoker -- 1.00 packs/day for 20 years    Types: Cigarettes    Quit date: 02/26/1993  . Smokeless tobacco: Never Used     Comment: QUIT SMOKING 20 YEARS AGO  . Alcohol Use: No  . Drug Use: No  . Sexual Activity: Not on file   Other Topics Concern  . Not on file   Social History Narrative   No living will   Wife should make health care decisions   Would accept resuscitation   Not sure about tube feeds    No Known Allergies   Constitutional:Denies headache, fatigue, fever or abrupt weight changes.  HEENT:  Positive nasal congestion. Denies eye redness, eye pain, pressure behind the eyes, facial pain, sore throat, ear pain, ringing  in the ears, wax buildup, runny nose or bloody nose. Respiratory: Positive cough. Denies difficulty breathing or shortness of breath.  Cardiovascular: Denies chest pain, chest tightness, palpitations or swelling in the hands or feet.   No other specific complaints in a complete review of systems (except as listed in HPI above).  Objective:   BP 124/68 mmHg  Pulse 70  Temp(Src) 98.2 F (36.8 C) (Oral)  Wt 192 lb (87.091 kg)  SpO2 97% Wt Readings from Last 3 Encounters:  05/11/14 192 lb (87.091 kg)  02/17/14 194 lb (87.998 kg)  02/16/14 195 lb (88.451 kg)     General: Appears his stated age, well developed, well nourished in NAD. HEENT: Head: normal shape and size, no sinus tenderness noted;  Ears: Tm's gray and intact, normal light reflex, + serous effusion; Nose: mucosa pink and moist, septum midline; Throat/Mouth:  Teeth present, mucosa erythematous and moist, no exudate noted, no lesions or ulcerations noted. No adenopathy noted.  Cardiovascular: Normal rate and rhythm. S1,S2 noted.  No murmur, rubs or gallops noted.  Pulmonary/Chest: Normal effort and positive vesicular breath sounds. No respiratory distress. No wheezes, rales or ronchi noted.  Assessment & Plan:   Upper Respiratory Infection:  Get some rest and drink plenty of water eRx for Azithromax x 5 days Rx for Hycodan cough syrup  RTC as needed or if symptoms persist.

## 2014-05-15 ENCOUNTER — Other Ambulatory Visit: Payer: Self-pay | Admitting: Internal Medicine

## 2014-05-18 NOTE — Telephone Encounter (Signed)
Was seen 05/11/14--pt is requesting refill on Zpack--does pt need to f/u--please advise

## 2014-05-27 ENCOUNTER — Telehealth: Payer: Self-pay

## 2014-05-27 ENCOUNTER — Encounter: Payer: Self-pay | Admitting: Internal Medicine

## 2014-05-27 ENCOUNTER — Ambulatory Visit (INDEPENDENT_AMBULATORY_CARE_PROVIDER_SITE_OTHER): Payer: Federal, State, Local not specified - PPO | Admitting: Internal Medicine

## 2014-05-27 VITALS — BP 120/70 | HR 72 | Temp 98.1°F | Wt 191.0 lb

## 2014-05-27 DIAGNOSIS — J069 Acute upper respiratory infection, unspecified: Secondary | ICD-10-CM

## 2014-05-27 MED ORDER — AMOXICILLIN 875 MG PO TABS: 875.0000 mg | ORAL_TABLET | Freq: Two times a day (BID) | ORAL | Status: DC

## 2014-05-27 MED ORDER — HYDROCODONE-HOMATROPINE 5-1.5 MG/5ML PO SYRP: 5.0000 mL | ORAL_SOLUTION | Freq: Three times a day (TID) | ORAL | Status: DC | PRN

## 2014-05-27 NOTE — Progress Notes (Signed)
HPI  Pt presents to the clinic today to follow up fatigue, cough and chest congestion. He was seen for the same 05/11/14. He was diagnosed with a URI and given a zpack and Hycodan cough syrup. He reports that he took all the medication but continues to feel bad and have a cough. His symptoms got worse again about 3 days ago. The cough is non productive. He denies fever or chills but has had body aches. He has not tried anything OTC. He does have lupus of the lung. He has not had sick contacts that he is aware of.  Review of Systems      Past Medical History  Diagnosis Date  . Lupus     sees Dr Trudie Reed  . Coronary artery disease 2008/2009    MI with PCI x 2, then PCI x 1 in 2009  . Myocardial infarction 2008  . Dyslipidemia   . Hypertension   . Dysrhythmia     atrial fibrillation  . Lupus pericarditis   . Lupus disease of the lung   . Shortness of breath   . Colon polyps     Family History  Problem Relation Age of Onset  . Heart disease Mother   . Alcohol abuse Father   . Hyperlipidemia Sister   . Hypertension Sister   . Hyperlipidemia Brother   . Hypertension Brother   . Diabetes Paternal Uncle   . Cancer Brother     ?lung cancer    History   Social History  . Marital Status: Married    Spouse Name: N/A    Number of Children: 9  . Years of Education: N/A   Occupational History  . 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 Smoking status: Former Smoker -- 1.00 packs/day for 20 years    Types: Cigarettes    Quit date: 02/26/1993  . Smokeless tobacco: Never Used     Comment: QUIT SMOKING 20 YEARS AGO  . Alcohol Use: No  . Drug Use: No  . Sexual Activity: Not on file   Other Topics Concern  . Not on file   Social History Narrative   No living will   Wife should make health care decisions   Would accept resuscitation   Not sure about tube feeds    No Known Allergies   Constitutional: Positive headache,  fatigue. Denies fever or abrupt weight changes.  HEENT:  Positive sore throat. Denies eye redness, eye pain, pressure behind the eyes, facial pain, nasal congestion, ear pain, ringing in the ears, wax buildup, runny nose or bloody nose. Respiratory: Positive cough. Denies difficulty breathing or shortness of breath.  Cardiovascular: Denies chest pain, chest tightness, palpitations or swelling in the hands or feet.   No other specific complaints in a complete review of systems (except as listed in HPI above).  Objective:  BP 120/70 mmHg  Pulse 72  Temp(Src) 98.1 F (36.7 C) (Oral)  Wt 191 lb (86.637 kg)  SpO2 96%  Wt Readings from Last 3 Encounters:  05/11/14 192 lb (87.091 kg)  02/17/14 194 lb (87.998 kg)  02/16/14 195 lb (88.451 kg)     General: Appears his stated age, well developed, well nourished in NAD. HEENT: Head: normal shape and size, no sinus tenderness noted; Eyes: sclera white, no icterus, conjunctiva pink; Ears: Tm's gray and intact, normal light reflex; Nose: mucosa pink and moist, septum midline; Throat/Mouth: + PND. Teeth present, mucosa  pink and moist, no exudate noted, no lesions or ulcerations noted.  Neck: No cervical lymphadenopathy. Cardiovascular: Normal rate and rhythm. S1,S2 noted.  No murmur, rubs or gallops noted.  Pulmonary/Chest: Normal effort and positive vesicular breath sounds. No respiratory distress. No wheezes, rales or ronchi noted.      Assessment & Plan:   Upper Respiratory Infection, not resolved:  Get some rest and drink plenty of water Do salt water gargles for the sore throat eRx for Amoxil BID x 10 days Refilled Hycodan cough syrup  Follow up with PCP as needed or if symptoms persist.

## 2014-05-27 NOTE — Patient Instructions (Signed)
Cough, Adult  A cough is a reflex that helps clear your throat and airways. It can help heal the body or may be a reaction to an irritated airway. A cough may only last 2 or 3 weeks (acute) or may last more than 8 weeks (chronic).  CAUSES Acute cough:  Viral or bacterial infections. Chronic cough:  Infections.  Allergies.  Asthma.  Post-nasal drip.  Smoking.  Heartburn or acid reflux.  Some medicines.  Chronic lung problems (COPD).  Cancer. SYMPTOMS   Cough.  Fever.  Chest pain.  Increased breathing rate.  High-pitched whistling sound when breathing (wheezing).  Colored mucus that you cough up (sputum). TREATMENT   A bacterial cough may be treated with antibiotic medicine.  A viral cough must run its course and will not respond to antibiotics.  Your caregiver may recommend other treatments if you have a chronic cough. HOME CARE INSTRUCTIONS   Only take over-the-counter or prescription medicines for pain, discomfort, or fever as directed by your caregiver. Use cough suppressants only as directed by your caregiver.  Use a cold steam vaporizer or humidifier in your bedroom or home to help loosen secretions.  Sleep in a semi-upright position if your cough is worse at night.  Rest as needed.  Stop smoking if you smoke. SEEK IMMEDIATE MEDICAL CARE IF:   You have pus in your sputum.  Your cough starts to worsen.  You cannot control your cough with suppressants and are losing sleep.  You begin coughing up blood.  You have difficulty breathing.  You develop pain which is getting worse or is uncontrolled with medicine.  You have a fever. MAKE SURE YOU:   Understand these instructions.  Will watch your condition.  Will get help right away if you are not doing well or get worse. Document Released: 11/17/2010 Document Revised: 08/13/2011 Document Reviewed: 11/17/2010 Exit[employer] Patient Information 2015 [employer]. This information is not intended  to replace advice given to you by your health care provider. Make sure you discuss any questions you have with your health care provider.  

## 2014-05-27 NOTE — Telephone Encounter (Signed)
Pt was seen 05/11/14 and finished abx but pt still complains with non productive cough and feeling badly. zpak request was denied on 05/18/14; pt wants z pak. Pt scheduled appt today at 12:15 pm with Webb Silversmith NP.

## 2014-05-27 NOTE — Progress Notes (Signed)
Pre visit review using our clinic review tool, if applicable. No additional management support is needed unless otherwise documented below in the visit note. 

## 2014-08-09 ENCOUNTER — Telehealth: Payer: Self-pay | Admitting: Internal Medicine

## 2014-08-09 DIAGNOSIS — B351 Tinea unguium: Secondary | ICD-10-CM

## 2014-08-09 NOTE — Telephone Encounter (Signed)
Pt called wanting a referral to a foot dr   He saw dr Jacqualyn Posey in [place].  He was not happy with him Pt still has a fungus

## 2014-08-10 ENCOUNTER — Encounter: Payer: Self-pay | Admitting: Cardiovascular Disease

## 2014-08-10 ENCOUNTER — Ambulatory Visit (INDEPENDENT_AMBULATORY_CARE_PROVIDER_SITE_OTHER): Payer: Federal, State, Local not specified - PPO | Admitting: Cardiovascular Disease

## 2014-08-10 VITALS — BP 130/76 | HR 61 | Ht 72.0 in | Wt 193.8 lb

## 2014-08-10 DIAGNOSIS — I25119 Atherosclerotic heart disease of native coronary artery with unspecified angina pectoris: Secondary | ICD-10-CM

## 2014-08-10 DIAGNOSIS — E785 Hyperlipidemia, unspecified: Secondary | ICD-10-CM

## 2014-08-10 DIAGNOSIS — I739 Peripheral vascular disease, unspecified: Secondary | ICD-10-CM

## 2014-08-10 DIAGNOSIS — M3212 Pericarditis in systemic lupus erythematosus: Secondary | ICD-10-CM

## 2014-08-10 LAB — BASIC METABOLIC PANEL
BUN: 9 mg/dL (ref 6–23)
CO2: 30 mEq/L (ref 19–32)
CREATININE: 0.87 mg/dL (ref 0.40–1.50)
Calcium: 9.2 mg/dL (ref 8.4–10.5)
Chloride: 107 mEq/L (ref 96–112)
GFR: 111.95 mL/min (ref >60.00)
Glucose, Bld: 86 mg/dL (ref 70–99)
POTASSIUM: 4.5 meq/L (ref 3.5–5.1)
Sodium: 140 mEq/L (ref 135–145)

## 2014-08-10 LAB — HEPATIC FUNCTION PANEL
ALBUMIN: 3.6 g/dL (ref 3.5–5.2)
ALK PHOS: 75 U/L (ref 39–117)
ALT: 7 U/L (ref 0–53)
AST: 12 U/L (ref 0–37)
Bilirubin, Direct: 0.1 mg/dL (ref 0.0–0.3)
Total Bilirubin: 0.5 mg/dL (ref 0.2–1.2)
Total Protein: 7.3 g/dL (ref 6.0–8.3)

## 2014-08-10 LAB — LIPID PANEL
CHOLESTEROL: 133 mg/dL (ref 0–200)
HDL: 39.5 mg/dL (ref >39.00)
LDL CALC: 81 mg/dL (ref 0–99)
NonHDL: 93.5
TRIGLYCERIDES: 65 mg/dL (ref 0.0–149.0)
Total CHOL/HDL Ratio: 3
VLDL: 13 mg/dL (ref 0.0–40.0)

## 2014-08-10 MED ORDER — ASPIRIN EC 81 MG PO TBEC: 81.0000 mg | DELAYED_RELEASE_TABLET | Freq: Every day | ORAL | Status: DC

## 2014-08-10 NOTE — Telephone Encounter (Signed)
Referral placed.

## 2014-08-10 NOTE — Progress Notes (Signed)
Cardiology Office Note   Date:  08/10/2014   ID:  Cody Arellano, DOB 1946-05-03, MRN [id number]  PCP:  Viviana Simpler, MD  Cardiologist:   Acie Fredrickson Wonda Cheng, MD   Chief Complaint  Patient presents with  . Follow-up    pericarditis   1. CAD - mi in 2008 2. Lupus 3. Pericarditis 4. Hypertension 5. Hyperlipidemia   History of Present Illness:  Sept. 8, 2015:  Press was seen in Nov. 2014 for follow up of his Lupus pericarditis.  Cody Arellano has had significant worsening of DOE since I last saw him. He has not had to take any nitroglycerin. He does think the symptoms are very similar to when he had his MI.  He denies any PND orthopnea. He denies any pedal edema. He tries to avoid salt but it makes it he eats some salt on occasion.   August 10, 2014 Cody Arellano is a 69 y.o. 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 He also has a history of coronary artery disease, hypertension, and hyperlipidemia.  Has bilateral foot burning - not necessarily with ambulation No CP ,  Watches his diet Walks several miles a day 4-5 times a week.   Past Medical History  Diagnosis Date  . Lupus     sees Dr Trudie Reed  . Coronary artery disease 2008/2009    MI with PCI x 2, then PCI x 1 in 2009  . Myocardial infarction 2008  . Dyslipidemia   . Hypertension   . Dysrhythmia     atrial fibrillation  . Lupus pericarditis   . Lupus disease of the lung   . Shortness of breath   . Colon polyps     Past Surgical History  Procedure Laterality Date  . Coronary stents  2009  . Cardiac catheterization       Current Outpatient Prescriptions  Medication Sig Dispense Refill  . AzaTHIOprine (IMURAN PO) Take 25 mg by mouth 2 (two) times daily.    . clopidogrel (PLAVIX) 75 MG tablet Take 75 mg by mouth daily.    . hydroxychloroquine (PLAQUENIL) 200 MG tablet Take 200 mg by mouth at bedtime.    [name] LISINOPRIL PO Take 2.5 mg by mouth daily.    [name] lovastatin (MEVACOR) 20 MG tablet Take 20 mg by  mouth at bedtime.    . predniSONE (DELTASONE) 20 MG tablet Take 2.5 mg by mouth daily with breakfast.     No current facility-administered medications for this visit.    Allergies:   Review of patient's allergies indicates no known allergies.    Social History:  The patient  reports that he quit smoking about 21 years ago. His smoking use included Cigarettes. He has a 20 pack-year smoking history. He has never used smokeless tobacco. He reports that he does not drink alcohol or use illicit drugs.   Family History:  The patient's family history includes Alcohol abuse in his father; Cancer in his brother; Diabetes in his paternal uncle; Heart disease in his mother; Hyperlipidemia in his brother and sister; Hypertension in his brother and sister.    ROS:  Please see the history of present illness.    Review of Systems: Constitutional:  denies fever, chills, diaphoresis, appetite change and fatigue.  HEENT: denies photophobia, eye pain, redness, hearing loss, ear pain, congestion, sore throat, rhinorrhea, sneezing, neck pain, neck stiffness and tinnitus.  Respiratory: denies SOB, DOE, cough, chest tightness, and wheezing.  Cardiovascular: denies chest pain, palpitations and leg swelling.  Gastrointestinal: denies nausea, vomiting, abdominal pain, diarrhea, constipation, blood in stool.  Genitourinary: denies dysuria, urgency, frequency, hematuria, flank pain and difficulty urinating.  Musculoskeletal: denies  myalgias, back pain, joint swelling, arthralgias and gait problem.   Skin: denies pallor, rash and wound.  Neurological: denies dizziness, seizures, syncope, weakness, light-headedness, numbness and headaches.   Hematological: denies adenopathy, easy bruising, personal or family bleeding history.  Psychiatric/ Behavioral: denies suicidal ideation, mood changes, confusion, nervousness, sleep disturbance and agitation.       All other systems are reviewed and negative.    PHYSICAL  EXAM: VS:  BP 130/76 mmHg  Pulse 61  Ht 6' (1.829 m)  Wt 193 lb 12.8 oz (87.907 kg)  BMI 26.28 kg/m2 , BMI Body mass index is 26.28 kg/(m^2). GEN: Well nourished, well developed, in no acute distress HEENT: normal Neck: no JVD, carotid bruits, or masses Cardiac: RRR; no murmurs, rubs, or gallops,no edema ,  The pulses in his left foot are difficult to feel  Respiratory:  clear to auscultation bilaterally, normal work of breathing GI: soft, nontender, nondistended, + BS MS: no deformity or atrophy Skin: warm and dry, no rash Neuro:  Strength and sensation are intact Psych: normal   EKG:  EKG is not ordered today.    Recent Labs: No results found for requested labs within last 365 days.    Lipid Panel No results found for: CHOL, TRIG, HDL, CHOLHDL, VLDL, LDLCALC, LDLDIRECT    Wt Readings from Last 3 Encounters:  08/10/14 193 lb 12.8 oz (87.907 kg)  05/27/14 191 lb (86.637 kg)  05/11/14 192 lb (87.091 kg)      Other studies Reviewed: Additional studies/ records that were reviewed today include: . Review of the above records demonstrates:    ASSESSMENT AND PLAN:  1.  CAD :  He had a stent in 2008. Will DC plavix and start ASA  2. Hyperlipidemia: We'll check fasting lipids today. Continue Mevacor.  3. Peripheral neuropathy.  4. ? Possible PAD:  He has reduced foot pulses in his left foot. Will check Ankel brachial index    Current medicines are reviewed at length with the patient today.  The patient does not have concerns regarding medicines.  The following changes have been made:  no change   Disposition:   FU with me in 1 year.     Signed, Keiran Gaffey, Wonda Cheng, MD  08/10/2014 9:42 AM    [facility] [address], [place], [place]  [address] Phone: [phone]; Fax: [phone]

## 2014-08-10 NOTE — Telephone Encounter (Signed)
Left detailed message on voicemail that referral has been placed and referral coordinator will be in touch to get this set up for him.

## 2014-08-10 NOTE — Patient Instructions (Addendum)
Your physician has recommended you make the following change in your medication:  STOP Plavix START Aspirin 81 mg once daily   Your physician has requested that you have a lower extremity arterial duplex. This test is an ultrasound of the arteries in the legs or arms. It looks at arterial blood flow in the legs and arms. Allow one hour for Lower and Upper Arterial scans. There are no restrictions or special instructions  Your physician recommends that you have lab work:  TODAY - cholesterol, liver, basic metabolic panel  Your physician wants you to follow-up in: 1 year with Dr. Acie Fredrickson.  You will receive a reminder letter in the mail two months in advance. If you don't receive a letter, please call our office to schedule the follow-up appointment.

## 2014-08-11 ENCOUNTER — Other Ambulatory Visit (HOSPITAL_COMMUNITY): Payer: Self-pay | Admitting: Cardiology

## 2014-08-11 DIAGNOSIS — I739 Peripheral vascular disease, unspecified: Secondary | ICD-10-CM

## 2014-08-19 ENCOUNTER — Encounter (HOSPITAL_COMMUNITY): Payer: Federal, State, Local not specified - PPO

## 2014-11-03 ENCOUNTER — Emergency Department (HOSPITAL_COMMUNITY): Payer: Federal, State, Local not specified - PPO

## 2014-11-03 ENCOUNTER — Emergency Department (HOSPITAL_COMMUNITY)
Admission: EM | Admit: 2014-11-03 | Discharge: 2014-11-03 | Disposition: A | Discharge status: Home / Self Care | Payer: Federal, State, Local not specified - PPO | Attending: Emergency Medicine | Admitting: Emergency Medicine

## 2014-11-03 DIAGNOSIS — S299XXA Unspecified injury of thorax, initial encounter: Secondary | ICD-10-CM | POA: Diagnosis not present

## 2014-11-03 DIAGNOSIS — Z7982 Long term (current) use of aspirin: Secondary | ICD-10-CM | POA: Insufficient documentation

## 2014-11-03 DIAGNOSIS — Z86010 Personal history of colonic polyps: Secondary | ICD-10-CM | POA: Insufficient documentation

## 2014-11-03 DIAGNOSIS — I252 Old myocardial infarction: Secondary | ICD-10-CM | POA: Diagnosis not present

## 2014-11-03 DIAGNOSIS — Z8709 Personal history of other diseases of the respiratory system: Secondary | ICD-10-CM | POA: Diagnosis not present

## 2014-11-03 DIAGNOSIS — S42002A Fracture of unspecified part of left clavicle, initial encounter for closed fracture: Secondary | ICD-10-CM | POA: Diagnosis not present

## 2014-11-03 DIAGNOSIS — Y9389 Activity, other specified: Secondary | ICD-10-CM | POA: Insufficient documentation

## 2014-11-03 DIAGNOSIS — S4992XA Unspecified injury of left shoulder and upper arm, initial encounter: Secondary | ICD-10-CM | POA: Diagnosis present

## 2014-11-03 DIAGNOSIS — Y998 Other external cause status: Secondary | ICD-10-CM | POA: Diagnosis not present

## 2014-11-03 DIAGNOSIS — I2510 Atherosclerotic heart disease of native coronary artery without angina pectoris: Secondary | ICD-10-CM | POA: Insufficient documentation

## 2014-11-03 DIAGNOSIS — Z7952 Long term (current) use of systemic steroids: Secondary | ICD-10-CM | POA: Diagnosis not present

## 2014-11-03 DIAGNOSIS — S0990XA Unspecified injury of head, initial encounter: Secondary | ICD-10-CM | POA: Insufficient documentation

## 2014-11-03 DIAGNOSIS — Y92410 Unspecified street and highway as the place of occurrence of the external cause: Secondary | ICD-10-CM | POA: Insufficient documentation

## 2014-11-03 DIAGNOSIS — I10 Essential (primary) hypertension: Secondary | ICD-10-CM | POA: Insufficient documentation

## 2014-11-03 DIAGNOSIS — Z87891 Personal history of nicotine dependence: Secondary | ICD-10-CM | POA: Diagnosis not present

## 2014-11-03 LAB — CBC
HEMATOCRIT: 45.1 % (ref 39.0–52.0)
Hemoglobin: 14.9 g/dL (ref 13.0–17.0)
MCH: 31.6 pg (ref 26.0–34.0)
MCHC: 33 g/dL (ref 30.0–36.0)
MCV: 95.6 fL (ref 78.0–100.0)
PLATELETS: 201 10*3/uL (ref 150–400)
RBC: 4.72 MIL/uL (ref 4.22–5.81)
RDW: 13.3 % (ref 11.5–15.5)
WBC: 9.1 10*3/uL (ref 4.0–10.5)

## 2014-11-03 LAB — BASIC METABOLIC PANEL
Anion gap: 9 (ref 5–15)
BUN: 8 mg/dL (ref 6–20)
CO2: 22 mmol/L (ref 22–32)
CREATININE: 0.99 mg/dL (ref 0.61–1.24)
Calcium: 8.5 mg/dL — ABNORMAL LOW (ref 8.9–10.3)
Chloride: 109 mmol/L (ref 101–111)
GFR calc Af Amer: >60 mL/min (ref >60)
Glucose, Bld: 88 mg/dL (ref 65–99)
Potassium: 3.9 mmol/L (ref 3.5–5.1)
Sodium: 140 mmol/L (ref 135–145)

## 2014-11-03 MED ORDER — IBUPROFEN 600 MG PO TABS: 600.0000 mg | ORAL_TABLET | Freq: Three times a day (TID) | ORAL | Status: DC | PRN

## 2014-11-03 MED ORDER — HYDROCODONE-ACETAMINOPHEN 5-325 MG PO TABS: 1.0000 | ORAL_TABLET | ORAL | Status: DC | PRN

## 2014-11-03 MED ORDER — MORPHINE SULFATE 4 MG/ML IJ SOLN
4.0000 mg | Freq: Once | INTRAMUSCULAR | Status: AC
  Administered 2014-11-03: 4 mg via INTRAVENOUS
  Filled 2014-11-03: qty 1

## 2014-11-03 NOTE — Discharge Instructions (Signed)
Clavicle Fracture °The clavicle, also called the collarbone, is the long bone that connects your shoulder to your rib cage. You can feel your collarbone at the top of your shoulders and rib cage. A clavicle fracture is a broken clavicle. It is a common injury that can happen at any age.  °CAUSES °Common causes of a clavicle fracture include: °· A direct blow to your shoulder. °· A car accident. °· A fall, especially if you try to break your fall with an outstretched arm. °RISK FACTORS °You may be at increased risk if: °· You are younger than 25 years or older than 75 years. Most clavicle fractures happen to people who are younger than 25 years. °· You are a male. °· You play contact sports. °SIGNS AND SYMPTOMS °A fractured clavicle is painful. It also makes it hard to move your arm. Other signs and symptoms may include: °· A shoulder that drops downward and forward. °· Pain when trying to lift your shoulder. °· Bruising, swelling, and tenderness over your clavicle. °· A grinding noise when you try to move your shoulder. °· A bump over your clavicle. °DIAGNOSIS °Your health care provider can usually diagnose a clavicle fracture by asking about your injury and examining your shoulder and clavicle. He or she may take an X-ray to determine the position of your clavicle. °TREATMENT °Treatment depends on the position of your clavicle after the fracture: °· If the broken ends of the bone are not out of place, your health care provider may put your arm in a sling or wrap a support bandage around your chest (figure-of-eight wrap). °· If the broken ends of the bone are out of place, you may need surgery. Surgery may involve placing screws, pins, or plates to keep your clavicle stable while it heals. Healing may take about 3 months. °When your health care provider thinks your fracture has healed enough, you may have to do physical therapy to regain normal movement and build up your arm strength. °HOME CARE INSTRUCTIONS   °· Apply ice to the injured area: °¨ Put ice in a plastic bag. °¨ Place a towel between your skin and the bag. °¨ Leave the ice on for 20 minutes, 2-3 times a day. °· If you have a wrap or splint: °¨ Wear it all the time, and remove it only to take a bath or shower. °¨ When you bathe or shower, keep your shoulder in the same position as when the sling or wrap is on. °¨ Do not lift your arm. °· If you have a figure-of-eight wrap: °¨ Another person must tighten it every day. °¨ It should be tight enough to hold your shoulders back. °¨ Allow enough room to place your index finger between your body and the strap. °¨ Loosen the wrap immediately if you feel numbness or tingling in your hands. °· Only take medicines as directed by your health care provider. °· Avoid activities that make the injury or pain worse for 4-6 weeks after surgery. °· Keep all follow-up appointments. °SEEK MEDICAL CARE IF:  °Your medicine is not helping to relieve pain and swelling. °SEEK IMMEDIATE MEDICAL CARE IF:  °Your arm is numb, cold, or pale, even when the splint is loose. °MAKE SURE YOU:  °· Understand these instructions. °· Will watch your condition. °· Will get help right away if you are not doing well or get worse. °Document Released: 02/28/2005 Document Revised: 05/26/2013 Document Reviewed: 04/13/2013 °[employer]® Patient Information ©2015 [employer]. This information is   not intended to replace advice given to you by your health care provider. Make sure you discuss any questions you have with your health care provider.
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
## 2014-11-03 NOTE — ED Notes (Signed)
Message left on Cody Arellano voicemail per pts request, contact info for this nurse was left on voicemail
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
## 2014-11-04 ENCOUNTER — Telehealth: Payer: Self-pay | Admitting: *Deleted

## 2014-11-04 NOTE — Telephone Encounter (Signed)
Spoke with patient and he has a broken clavicle, and he's doing fine. Pt states the ortho dr said it will just have to heal on it's on.

## 2014-11-05 ENCOUNTER — Ambulatory Visit (INDEPENDENT_AMBULATORY_CARE_PROVIDER_SITE_OTHER): Payer: Federal, State, Local not specified - PPO | Admitting: Internal Medicine

## 2014-11-05 ENCOUNTER — Encounter: Payer: Self-pay | Admitting: Internal Medicine

## 2014-11-05 VITALS — BP 120/70 | HR 60 | Temp 97.4°F | Wt 196.0 lb

## 2014-11-05 DIAGNOSIS — M545 Low back pain: Secondary | ICD-10-CM | POA: Diagnosis not present

## 2014-11-05 DIAGNOSIS — G8911 Acute pain due to trauma: Secondary | ICD-10-CM

## 2014-11-05 NOTE — Assessment & Plan Note (Signed)
Seems to be muscular Reassured Discussed using the ibuprofen regularly--should help the clavicle
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
## 2014-11-05 NOTE — Progress Notes (Signed)
Pre visit review using our clinic review tool, if applicable. No additional management support is needed unless otherwise documented below in the visit note. 

## 2014-11-05 NOTE — Patient Instructions (Signed)
Please take the ibuprofen three times a day with meals for the next week or 2--till the collarbone doesn't hurt at all

## 2014-12-10 ENCOUNTER — Telehealth: Payer: Self-pay

## 2014-12-10 NOTE — Telephone Encounter (Signed)
Pt request status of medical records request; advised since 11/29/14 have had 4 request for records from [place] in [facility] book; offered pt michelles # to ck on status; pt said that was OK as long as request had been received.

## 2014-12-14 NOTE — Telephone Encounter (Signed)
Cody Arellano with [facility] left v/m requesting status of medical records requested on 11/19/14 and 12/07/14. There are 4 dates in [facility] bood of request for med records from [place]. Spoke with Tamika at [facility] and given ref # [id number] and advised Tamika to contact [facility] at [phone].Tamika voiced understanding.

## 2015-06-09 ENCOUNTER — Ambulatory Visit (INDEPENDENT_AMBULATORY_CARE_PROVIDER_SITE_OTHER): Payer: Federal, State, Local not specified - PPO | Admitting: Family Medicine

## 2015-06-09 ENCOUNTER — Encounter: Payer: Self-pay | Admitting: Family Medicine

## 2015-06-09 VITALS — BP 110/74 | HR 65 | Temp 98.6°F | Ht 72.0 in | Wt 196.6 lb

## 2015-06-09 DIAGNOSIS — J069 Acute upper respiratory infection, unspecified: Secondary | ICD-10-CM | POA: Insufficient documentation

## 2015-06-09 MED ORDER — HYDROCODONE-HOMATROPINE 5-1.5 MG/5ML PO SYRP: 5.0000 mL | ORAL_SOLUTION | Freq: Three times a day (TID) | ORAL | Status: DC | PRN

## 2015-06-09 MED ORDER — AMOXICILLIN-POT CLAVULANATE 875-125 MG PO TABS: 1.0000 | ORAL_TABLET | Freq: Two times a day (BID) | ORAL | Status: DC

## 2015-06-09 NOTE — Progress Notes (Signed)
Pre visit review using our clinic review tool, if applicable. No additional management support is needed unless otherwise documented below in the visit note. 

## 2015-06-09 NOTE — Progress Notes (Signed)
Patient ID: Cody Arellano, male   DOB: [birth year], 70 y.o.   MRN: [id number]  Tommi Rumps, MD Phone: [phone]  Cody Arellano is a 70 y.o. male who presents today for same-day visit.  Patient notes 6 days of cough, rhinorrhea, sneezing, and nasal congestion. He notes some chest congestion as well. He notes he's been coughing up green/gray mucus. He notes he is also blowing green/gray mucus out of his nose. He also feels somewhat tired. No shortness of breath or chest pain. No fevers or chills, though he does note some sweats. Notes his wife is sick with similar symptoms. States he gets this yearly and typically gets an antibiotic. He has tried Robitussin cough syrup with minimal benefit. He does have lupus disease of his lungs and is on several immunomodulators.  PMH: Former smoker   ROS see history of present illness  Objective  Physical Exam Filed Vitals:   06/09/15 1013  BP: 110/74  Pulse: 65  Temp: 98.6 F (37 C)    Physical Exam  Constitutional: He is well-developed, well-nourished, and in no distress.  HENT:  Head: Normocephalic and atraumatic.  Right Ear: External ear normal.  Left Ear: External ear normal.  Mouth/Throat: No oropharyngeal exudate.  Posterior oropharyngeal erythema, normal TMs bilaterally  Eyes: Conjunctivae are normal. Pupils are equal, round, and reactive to light.  Neck: Neck supple.  Cardiovascular: Normal rate, regular rhythm and normal heart sounds.  Exam reveals no gallop and no friction rub.   No murmur heard. Pulmonary/Chest: Effort normal and breath sounds normal. No respiratory distress. He has no wheezes. He has no rales.  Lymphadenopathy:    He has no cervical adenopathy.  Neurological: He is alert. Gait normal.  Skin: Skin is warm and dry. He is not diaphoretic.     Assessment/Plan: Please see individual problem list.  Acute upper respiratory infection Patient's symptoms most consistent with acute upper respiratory infection. He does  have some purulent green phlegm that is coughed up and gone out of his nose. Vital signs are stable. Benign lung exam. Given his duration of symptoms and his history of lupus being on immunomodulators and prednisone we will treat with an antibiotic. He is given Augmentin to treat for possible bacterial bronchitis. Hycodan for cough. Given return precautions.    Meds ordered this encounter  Medications  . amoxicillin-clavulanate (AUGMENTIN) 875-125 MG tablet    Sig: Take 1 tablet by mouth 2 (two) times daily.    Dispense:  14 tablet    Refill:  0  . HYDROcodone-homatropine (HYCODAN) 5-1.5 MG/5ML syrup    Sig: Take 5 mLs by mouth every 8 (eight) hours as needed for cough.    Dispense:  120 mL    Refill:  0    Dragon voice recognition software was used during the dictation process of this note. If any phrases or words seem inappropriate it is likely secondary to the translation process being inefficient.  Tommi Rumps

## 2015-06-09 NOTE — Assessment & Plan Note (Signed)
Patient's symptoms most consistent with acute upper respiratory infection. He does have some purulent green phlegm that is coughed up and gone out of his nose. Vital signs are stable. Benign lung exam. Given his duration of symptoms and his history of lupus being on immunomodulators and prednisone we will treat with an antibiotic. He is given Augmentin to treat for possible bacterial bronchitis. Hycodan for cough. Given return precautions.

## 2015-06-09 NOTE — Patient Instructions (Signed)
Nice to meet you. You likely have bronchitis. We will treat this with Augmentin. You can take Hycodan syrup as needed for cough. Please do not take this with any other narcotic medications. Please take a probiotic or eat yogurt while on the antibiotic. If you develop chest pain, shortness of breath, cough productive of blood, fevers, or any new or changing symptoms please seek medical attention.

## 2015-08-24 ENCOUNTER — Ambulatory Visit (INDEPENDENT_AMBULATORY_CARE_PROVIDER_SITE_OTHER): Payer: Federal, State, Local not specified - PPO | Admitting: Cardiovascular Disease

## 2015-08-24 ENCOUNTER — Encounter: Payer: Self-pay | Admitting: Cardiovascular Disease

## 2015-08-24 VITALS — BP 112/80 | HR 54 | Ht 72.0 in | Wt 194.6 lb

## 2015-08-24 DIAGNOSIS — M3212 Pericarditis in systemic lupus erythematosus: Secondary | ICD-10-CM

## 2015-08-24 DIAGNOSIS — Z8679 Personal history of other diseases of the circulatory system: Secondary | ICD-10-CM | POA: Diagnosis not present

## 2015-08-24 DIAGNOSIS — I25810 Atherosclerosis of coronary artery bypass graft(s) without angina pectoris: Secondary | ICD-10-CM | POA: Diagnosis not present

## 2015-08-24 NOTE — Progress Notes (Signed)
Cardiology Office Note   Date:  08/24/2015   ID:  Cody Arellano, DOB 09/22/1945, MRN [id number]  PCP:  Viviana Simpler, MD  Cardiologist:   Acie Fredrickson Wonda Cheng, MD   Chief Complaint  Patient presents with  . Coronary Artery Disease    NO CHEST PAIN , SOB OR SWELLING   1. CAD - mi in 2008 2. Lupus 3. Pericarditis 4. Hypertension 5. Hyperlipidemia   History of Present Illness:  Sept. 8, 2015:  Jermale was seen in Nov. 2014 for follow up of his Lupus pericarditis.  Thinh has had significant worsening of DOE since I last saw him. He has not had to take any nitroglycerin. He does think the symptoms are very similar to when he had his MI.  He denies any PND orthopnea. He denies any pedal edema. He tries to avoid salt but it makes it he eats some salt on occasion.   August 10, 2014 Creeden Demers is a 70 y.o. 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 He also has a history of coronary artery disease, hypertension, and hyperlipidemia.  Has bilateral foot burning - not necessarily with ambulation No CP ,  Watches his diet Walks several miles a day 4-5 times a week.   August 24, 2015:  Has remained active.   No CP or dyspnea . Walks regularly  No additional symptoms of pericarditis   Past Medical History  Diagnosis Date  . Lupus [facility])     sees Dr Trudie Reed  . Coronary artery disease 2008/2009    MI with PCI x 2, then PCI x 1 in 2009  . Myocardial infarction ([place]) 2008  . Dyslipidemia   . Hypertension   . Dysrhythmia     atrial fibrillation  . 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 Shortness of breath   . Colon polyps     Past Surgical History  Procedure Laterality Date  . Coronary stents  2009  . Cardiac catheterization       Current Outpatient Prescriptions  Medication Sig Dispense Refill  . aspirin EC 81 MG tablet Take 1 tablet (81 mg total) by mouth daily.    [name] azaTHIOprine (IMURAN) 50 MG tablet Take 50 mg by mouth daily.    2  . hydroxychloroquine (PLAQUENIL) 200 MG tablet Take 200 mg by mouth at bedtime.    [name] ibuprofen (ADVIL,MOTRIN) 600 MG tablet Take 1 tablet (600 mg total) by mouth every 8 (eight) hours as needed. 15 tablet 0  . lovastatin (MEVACOR) 20 MG tablet Take 20 mg by mouth at bedtime.    . predniSONE (DELTASONE) 5 MG tablet Take 5 mg by mouth daily with breakfast.   0   No current facility-administered medications for this visit.    Allergies:   Review of patient's allergies indicates no known allergies.    Social History:  The patient  reports that he quit smoking about 22 years ago. His smoking use included Cigarettes. He has a 20 pack-year smoking history. He has never used smokeless tobacco. He reports that he does not drink alcohol or use illicit drugs.   Family History:  The patient's family history includes Alcohol abuse in his father; Cancer in his brother; Diabetes in his paternal uncle; Heart disease in his mother; Hyperlipidemia in his brother and sister; Hypertension in his brother and sister.    ROS:  Please see the history of present illness.    Review of Systems:  Constitutional:  denies fever, chills, diaphoresis, appetite change and fatigue.  HEENT: denies photophobia, eye pain, redness, hearing loss, ear pain, congestion, sore throat, rhinorrhea, sneezing, neck pain, neck stiffness and tinnitus.  Respiratory: denies SOB, DOE, cough, chest tightness, and wheezing.  Cardiovascular: denies chest pain, palpitations and leg swelling.  Gastrointestinal: denies nausea, vomiting, abdominal pain, diarrhea, constipation, blood in stool.  Genitourinary: denies dysuria, urgency, frequency, hematuria, flank pain and difficulty urinating.  Musculoskeletal: denies  myalgias, back pain, joint swelling, arthralgias and gait problem.   Skin: denies pallor, rash and wound.  Neurological: denies dizziness, seizures, syncope, weakness, light-headedness, numbness and headaches.   Hematological:  denies adenopathy, easy bruising, personal or family bleeding history.  Psychiatric/ Behavioral: denies suicidal ideation, mood changes, confusion, nervousness, sleep disturbance and agitation.       All other systems are reviewed and negative.    PHYSICAL EXAM: VS:  BP 112/80 mmHg  Pulse 54  Ht 6' (1.829 m)  Wt 194 lb 9.6 oz (88.27 kg)  BMI 26.39 kg/m2 , BMI Body mass index is 26.39 kg/(m^2). GEN: Well nourished, well developed, in no acute distress HEENT: normal Neck: no JVD, carotid bruits, or masses Cardiac: RRR; no murmurs, rubs, or gallops,no edema ,  The pulses in his left foot are difficult to feel  Respiratory:  clear to auscultation bilaterally, normal work of breathing GI: soft, nontender, nondistended, + BS MS: no deformity or atrophy Skin: warm and dry, no rash Neuro:  Strength and sensation are intact Psych: normal   EKG:  EKG is ordered today.    Sinus brady at 54.   NS ST abn.   Recent Labs: 11/03/2014: BUN 8; Creatinine, Ser 0.99; Hemoglobin 14.9; Platelets 201; Potassium 3.9; Sodium 140    Lipid Panel    Component Value Date/Time   CHOL 133 08/10/2014 1102   TRIG 65.0 08/10/2014 1102   HDL 39.50 08/10/2014 1102   CHOLHDL 3 08/10/2014 1102   VLDL 13.0 08/10/2014 1102   LDLCALC 81 08/10/2014 1102      Wt Readings from Last 3 Encounters:  08/24/15 194 lb 9.6 oz (88.27 kg)  06/09/15 196 lb 9.6 oz (89.177 kg)  11/05/14 196 lb (88.905 kg)      Other studies Reviewed: Additional studies/ records that were reviewed today include: . Review of the above records demonstrates:    ASSESSMENT AND PLAN:  1.  CAD :  He had a stent in 2008. Will DC plavix and start ASA He has been stable. Will follow up with his primary me  2. Hyperlipidemia: We'll check fasting lipids today. Continue Mevacor. Will be managed by his medical doctor  Will be available if needed.   3. Peripheral neuropathy.  5. Lupus :   On meds.   Doing well.     Current medicines  are reviewed at length with the patient today.  The patient does not have concerns regarding medicines.  The following changes have been made:  no change   Disposition:   FU with me in 1 year.     Signed, Graviel Payeur, Wonda Cheng, MD  08/24/2015 3:00 PM    [address], [place], [place]  [address] Phone: [phone]; Fax: [phone]

## 2015-08-24 NOTE — Patient Instructions (Signed)

## 2015-11-03 ENCOUNTER — Other Ambulatory Visit: Payer: Self-pay

## 2015-11-03 MED ORDER — LOVASTATIN 20 MG PO TABS: 20.0000 mg | ORAL_TABLET | Freq: Every day | ORAL | Status: DC
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
## 2015-11-03 NOTE — Telephone Encounter (Signed)
Approved: 3 month supply----he needs to schedule an appt

## 2016-01-17 ENCOUNTER — Ambulatory Visit (INDEPENDENT_AMBULATORY_CARE_PROVIDER_SITE_OTHER): Payer: Federal, State, Local not specified - PPO | Admitting: Internal Medicine

## 2016-01-17 ENCOUNTER — Encounter: Payer: Self-pay | Admitting: Internal Medicine

## 2016-01-17 VITALS — BP 118/70 | HR 60 | Temp 98.6°F | Ht 72.0 in | Wt 189.0 lb

## 2016-01-17 DIAGNOSIS — I2510 Atherosclerotic heart disease of native coronary artery without angina pectoris: Secondary | ICD-10-CM

## 2016-01-17 DIAGNOSIS — N138 Other obstructive and reflux uropathy: Secondary | ICD-10-CM | POA: Insufficient documentation

## 2016-01-17 DIAGNOSIS — F39 Unspecified mood [affective] disorder: Secondary | ICD-10-CM | POA: Diagnosis not present

## 2016-01-17 DIAGNOSIS — R208 Other disturbances of skin sensation: Secondary | ICD-10-CM | POA: Diagnosis not present

## 2016-01-17 DIAGNOSIS — E785 Hyperlipidemia, unspecified: Secondary | ICD-10-CM | POA: Insufficient documentation

## 2016-01-17 DIAGNOSIS — N401 Enlarged prostate with lower urinary tract symptoms: Secondary | ICD-10-CM

## 2016-01-17 DIAGNOSIS — M329 Systemic lupus erythematosus, unspecified: Secondary | ICD-10-CM | POA: Diagnosis not present

## 2016-01-17 DIAGNOSIS — Z23 Encounter for immunization: Secondary | ICD-10-CM

## 2016-01-17 DIAGNOSIS — N400 Enlarged prostate without lower urinary tract symptoms: Secondary | ICD-10-CM | POA: Diagnosis not present

## 2016-01-17 DIAGNOSIS — I2583 Coronary atherosclerosis due to lipid rich plaque: Secondary | ICD-10-CM

## 2016-01-17 DIAGNOSIS — R200 Anesthesia of skin: Secondary | ICD-10-CM

## 2016-01-17 LAB — LIPID PANEL
CHOLESTEROL: 125 mg/dL (ref 0–200)
HDL: 39.8 mg/dL (ref >39.00)
LDL Cholesterol: 73 mg/dL (ref 0–99)
NonHDL: 84.72
TRIGLYCERIDES: 59 mg/dL (ref 0.0–149.0)
Total CHOL/HDL Ratio: 3
VLDL: 11.8 mg/dL (ref 0.0–40.0)

## 2016-01-17 LAB — PSA: PSA: 2.71 ng/mL (ref 0.10–4.00)

## 2016-01-17 LAB — VITAMIN B12: VITAMIN B 12: 235 pg/mL (ref 211–911)

## 2016-01-17 NOTE — Progress Notes (Signed)
Pre visit review using our clinic review tool, if applicable. No additional management support is needed unless otherwise documented below in the visit note. 

## 2016-01-17 NOTE — Assessment & Plan Note (Signed)
Remission on current Rx Continues with rheumatologist

## 2016-01-17 NOTE — Assessment & Plan Note (Signed)
Very low level dysthymia No Rx indicated

## 2016-01-17 NOTE — Assessment & Plan Note (Signed)
Has been quiet 

## 2016-01-17 NOTE — Assessment & Plan Note (Signed)
Mild symptoms No Rx needed Discussed PSA--will check one last time

## 2016-01-17 NOTE — Assessment & Plan Note (Signed)
Doing well with primary prevention 

## 2016-01-17 NOTE — Addendum Note (Signed)
Addended by: Emelia Salisbury C on: 01/17/2016 09:51 AM   Modules accepted: Orders

## 2016-01-17 NOTE — Progress Notes (Signed)
Subjective:    Patient ID: Cody Arellano, male    DOB: 07-Jul-1945, 70 y.o.   MRN: [id number]  HPI Here for follow up of chronic medical conditions  Doing well  Still sees Dr Trudie Reed for the lupus Azathioprine and hydroxychloroquine still Reviewed her June 2017 note Labs were fine He keeps up with yearly eye exam  No problems with statin No myalgias or GI problems  No heart problems No chest pain No SOB No change in exercise tolerance No edema  No regular anxiety Does get low level depression at times--- attributes to getting older Brief (only a few hours) and infrequent--like weekly Not anhedonic Able to get out of it with reading or praying  Current Outpatient Prescriptions on File Prior to Visit  Medication Sig Dispense Refill  . aspirin EC 81 MG tablet Take 1 tablet (81 mg total) by mouth daily.    [name] azaTHIOprine (IMURAN) 50 MG tablet Take 50 mg by mouth daily.   2  . hydroxychloroquine (PLAQUENIL) 200 MG tablet Take 200 mg by mouth at bedtime.    [name] ibuprofen (ADVIL,MOTRIN) 600 MG tablet Take 1 tablet (600 mg total) by mouth every 8 (eight) hours as needed. 15 tablet 0  . lovastatin (MEVACOR) 20 MG tablet Take 1 tablet (20 mg total) by mouth at bedtime. 90 tablet 0   No current facility-administered medications on file prior to visit.     No Known Allergies  Past Medical History:  Diagnosis Date  . Colon polyps   . Coronary artery disease 2008/2009   MI with PCI x 2, then PCI x 1 in 2009  . Dyslipidemia   . Dysrhythmia    atrial fibrillation  . Hypertension   . Lupus [facility])    sees Dr Trudie Reed  . 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 Myocardial infarction ([place]) 2008  . Shortness of breath     Past Surgical History:  Procedure Laterality Date  . CARDIAC CATHETERIZATION    . coronary stents  2009    Family History  Problem Relation Age of Onset  . Heart disease Mother   . Alcohol abuse Father   . Hyperlipidemia Sister   .  Hypertension Sister   . Hyperlipidemia Brother   . Hypertension Brother   . Cancer Brother     ?lung cancer  . Diabetes Paternal Uncle     Social History   Social History  . Marital status: Married    Spouse name: N/A  . Number of children: 5  . Years of education: N/A   Occupational History  . 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 Smoking status: Former Smoker    Packs/day: 1.00    Years: 20.00    Types: Cigarettes    Quit date: 02/26/1993  . Smokeless tobacco: Never Used     Comment: QUIT SMOKING 20 YEARS AGO  . Alcohol use No  . Drug use: No  . Sexual activity: Not on file   Other Topics Concern  . Not on file   Social History Narrative   No living will   Wife should make health care decisions   Would accept resuscitation   Not sure about tube feeds   Review of Systems Sleeps "okay". Wakes up for nocturia (1-2 usually) Occasional daytime urgency Appetite is good Weight is stable--trying to lose weight (down 4# recently) with improved eating  Does walk regularly-- 2 miles most days No falls--but did lose balance a few weeks ago Intermittent left great toe burning--better after walking on it    Objective:   Physical Exam  Constitutional: He is oriented to person, place, and time. He appears well-developed and well-nourished. No distress.  Neck: Normal range of motion. Neck supple. No thyromegaly present.  Cardiovascular: Normal rate, regular rhythm and normal heart sounds.  Exam reveals no gallop.   No murmur heard. Feet warm ?faint pulses  Pulmonary/Chest: Effort normal and breath sounds normal. No respiratory distress. He has no wheezes. He has no rales.  Abdominal: Soft. There is no tenderness.  Lymphadenopathy:    He has no cervical adenopathy.  Neurological: He is alert and oriented to person, place, and time.  Skin:  No foot lesions  Psychiatric: He has a normal mood and affect. His behavior is  normal.          Assessment & Plan:

## 2016-01-27 ENCOUNTER — Other Ambulatory Visit: Payer: Self-pay | Admitting: Internal Medicine

## 2016-04-18 ENCOUNTER — Telehealth: Payer: Self-pay

## 2016-04-18 NOTE — Telephone Encounter (Signed)
Pt request date last checked for prostate CA; advised pt had normal PSA on 01/17/16. Pt voiced understanding and that was all needed.

## 2016-05-24 ENCOUNTER — Encounter: Payer: Self-pay | Admitting: Internal Medicine

## 2016-06-27 ENCOUNTER — Encounter: Payer: Self-pay | Admitting: Primary Care

## 2016-06-27 ENCOUNTER — Ambulatory Visit (INDEPENDENT_AMBULATORY_CARE_PROVIDER_SITE_OTHER): Payer: Federal, State, Local not specified - PPO | Admitting: Primary Care

## 2016-06-27 VITALS — BP 122/84 | HR 61 | Temp 98.0°F | Ht 72.0 in | Wt 190.4 lb

## 2016-06-27 DIAGNOSIS — R200 Anesthesia of skin: Secondary | ICD-10-CM | POA: Diagnosis not present

## 2016-06-27 LAB — CBC
HEMATOCRIT: 44.4 % (ref 39.0–52.0)
HEMOGLOBIN: 14.6 g/dL (ref 13.0–17.0)
MCHC: 33 g/dL (ref 30.0–36.0)
MCV: 95.7 fl (ref 78.0–100.0)
PLATELETS: 202 10*3/uL (ref 150.0–400.0)
RBC: 4.64 Mil/uL (ref 4.22–5.81)
RDW: 14.7 % (ref 11.5–15.5)
WBC: 7.8 10*3/uL (ref 4.0–10.5)

## 2016-06-27 LAB — BASIC METABOLIC PANEL
BUN: 14 mg/dL (ref 6–23)
CALCIUM: 8.7 mg/dL (ref 8.4–10.5)
CO2: 26 mEq/L (ref 19–32)
Chloride: 109 mEq/L (ref 96–112)
Creatinine, Ser: 0.98 mg/dL (ref 0.40–1.50)
GFR: 97.05 mL/min (ref >60.00)
GLUCOSE: 85 mg/dL (ref 70–99)
Potassium: 4.2 mEq/L (ref 3.5–5.1)
SODIUM: 141 meq/L (ref 135–145)

## 2016-06-27 LAB — VITAMIN B12: Vitamin B-12: 235 pg/mL (ref 211–911)

## 2016-06-27 LAB — FOLATE: Folate: 8.9 ng/mL (ref >5.9)

## 2016-06-27 NOTE — Patient Instructions (Signed)
Complete lab work prior to leaving today. I will notify you of your results once received.   Please notify me if your numbness becomes worse and/or if you develop pain.  It was a pleasure meeting you!

## 2016-06-27 NOTE — Progress Notes (Signed)
Pre visit review using our clinic review tool, if applicable. No additional management support is needed unless otherwise documented below in the visit note. 

## 2016-06-27 NOTE — Progress Notes (Signed)
Subjective:    Patient ID: Cody Arellano, male    DOB: 1946/04/07, 71 y.o.   MRN: [id number]  HPI  [name] is a 71 year old male with a history of systemic lupus erythematosus and hyperlipidemia who presents today with a chief complaint of numbness. His numbness is located to the right toes including digits 2-5 which began Sunday this week. His numbness is constant. He denies pain to the foot or toes, left sided numbness, swelling/pain to the calf, recent injury/trauma to his foot, unilateral weakness, upper extremity numbness, neck pain, changes in speech.. He has noticed some lower back pain on Monday this week after lifting a heavy chair, this pain dissipated on Monday after he stopped lifting. He has noticed several flares of his SLE recently, with his last flare being 2 weeks ago.  Review of Systems  Eyes: Negative for visual disturbance.  Respiratory: Negative for shortness of breath.   Musculoskeletal: Negative for arthralgias.  Skin: Negative for color change.  Neurological: Positive for numbness. Negative for dizziness, speech difficulty and weakness.       Past Medical History:  Diagnosis Date  . Colon polyps   . Coronary artery disease 2008/2009   MI with PCI x 2, then PCI x 1 in 2009  . Dyslipidemia   . Dysrhythmia    atrial fibrillation  . Hypertension   . Lupus    sees Dr Trudie Reed  . 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 Myocardial infarction 2008  . Shortness of breath      Social History   Social History  . Marital status: Married    Spouse name: N/A  . Number of children: 5  . Years of education: N/A   Occupational History  . 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 Smoking status: Former Smoker    Packs/day: 1.00    Years: 20.00    Types: Cigarettes    Quit date: 02/26/1993  . Smokeless tobacco: Never Used     Comment: QUIT SMOKING 20 YEARS AGO  . Alcohol use No  . Drug use:  No  . Sexual activity: Not on file   Other Topics Concern  . Not on file   Social History Narrative   No living will   Wife should make health care decisions   Would accept resuscitation   Not sure about tube feeds    Past Surgical History:  Procedure Laterality Date  . CARDIAC CATHETERIZATION    . coronary stents  2009    Family History  Problem Relation Age of Onset  . Heart disease Mother   . Alcohol abuse Father   . Hyperlipidemia Sister   . Hypertension Sister   . Hyperlipidemia Brother   . Hypertension Brother   . Cancer Brother     ?lung cancer  . Diabetes Paternal Uncle     No Known Allergies  Current Outpatient Prescriptions on File Prior to Visit  Medication Sig Dispense Refill  . aspirin EC 81 MG tablet Take 1 tablet (81 mg total) by mouth daily.    [name] azaTHIOprine (IMURAN) 50 MG tablet Take 50 mg by mouth daily.   2  . hydroxychloroquine (PLAQUENIL) 200 MG tablet Take 200 mg by mouth at bedtime.    [name] ibuprofen (ADVIL,MOTRIN) 600 MG tablet Take 1 tablet (600 mg total) by mouth every 8 (eight) hours as needed. 15  tablet 0  . lovastatin (MEVACOR) 20 MG tablet TAKE 1 TABLET(20 MG) BY MOUTH AT BEDTIME 90 tablet 3   No current facility-administered medications on file prior to visit.     BP 122/84   Pulse 61   Temp 98 F (36.7 C) (Oral)   Ht 6' (1.829 m)   Wt 190 lb 6.4 oz (86.4 kg)   SpO2 98%   BMI 25.82 kg/m    Objective:   Physical Exam  Constitutional: He is oriented to person, place, and time. He appears well-nourished.  Eyes: EOM are normal. Pupils are equal, round, and reactive to light.  Neck: Neck supple.  Cardiovascular: Normal rate.   Pulses:      Dorsalis pedis pulses are 2+ on the right side, and 2+ on the left side.       Posterior tibial pulses are 2+ on the right side, and 2+ on the left side.  Pulmonary/Chest: Effort normal and breath sounds normal.  Musculoskeletal: Normal range of motion.  Neurological: He is alert and  oriented to person, place, and time. No cranial nerve deficit.  Strength equal to bilateral upper and lower extremities  Skin: Skin is warm and dry.  Psychiatric: He has a normal mood and affect.          Assessment & Plan:  Numbness:  Located to digits 2-5 of right foot. Exam today reassuring with normal pulses, no s/s of acute CVA, no signs of back pain, no signs of injury. Differentials include SLE, nerve impingement form back, vitamin deficiencies. Do not suspect decreased arterial blood flow, DVT. Check CBC, B 12, BMP today. Discussed return precautions including notifying of increased numbness or presence of pain.  Sheral Flow, NP

## 2017-02-07 ENCOUNTER — Other Ambulatory Visit: Payer: Self-pay | Admitting: Internal Medicine

## 2017-04-29 ENCOUNTER — Ambulatory Visit: Payer: Federal, State, Local not specified - PPO | Admitting: Family Medicine

## 2017-04-29 ENCOUNTER — Encounter: Payer: Self-pay | Admitting: Family Medicine

## 2017-04-29 VITALS — BP 120/70 | HR 58 | Temp 98.3°F | Wt 194.4 lb

## 2017-04-29 DIAGNOSIS — J209 Acute bronchitis, unspecified: Secondary | ICD-10-CM | POA: Diagnosis not present

## 2017-04-29 NOTE — Progress Notes (Signed)
Subjective:     Patient ID: Cody Arellano, male   DOB: 1945-08-26, 71 y.o.   MRN: [id number]  HPI Patient seen as a work in with onset about 10 days ago of URI type symptoms. He had some head congestion which has some improved and cough which is mostly dry. He states he does not feel particularly "sick". He's had some mild fatigue. No fever. No dyspnea. He does have history of lupus and reported interstitial lung disease but does not feel any below baseline at this point from a respiratory standpoint.  Past Medical History:  Diagnosis Date  . Colon polyps   . Coronary artery disease 2008/2009   MI with PCI x 2, then PCI x 1 in 2009  . Dyslipidemia   . Dysrhythmia    atrial fibrillation  . Hypertension   . Lupus    sees Dr Trudie Reed  . Lupus disease of the lung   . 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 Myocardial infarction ([place]) 2008  . Shortness of breath    Past Surgical History:  Procedure Laterality Date  . CARDIAC CATHETERIZATION    . coronary stents  2009    reports that he quit smoking about 24 years ago. His smoking use included cigarettes. He has a 20.00 pack-year smoking history. he has never used smokeless tobacco. He reports that he does not drink alcohol or use drugs. family history includes Alcohol abuse in his father; Cancer in his brother; Diabetes in his paternal uncle; Heart disease in his mother; Hyperlipidemia in his brother and sister; Hypertension in his brother and sister. No Known Allergies   Review of Systems  Constitutional: Negative for appetite change, chills, fever and unexpected weight change.  HENT: Positive for congestion.   Respiratory: Positive for cough. Negative for shortness of breath and wheezing.   Cardiovascular: Negative for chest pain.       Objective:   Physical Exam  Constitutional: He appears well-developed and well-nourished.  HENT:  Right Ear: External ear normal.  Left Ear: External ear normal.  Mouth/Throat: Oropharynx is clear and moist.   Neck: Neck supple.  Cardiovascular: Normal rate and regular rhythm.  Pulmonary/Chest: Effort normal and breath sounds normal. No respiratory distress. He has no wheezes. He has no rales.  Lymphadenopathy:    He has no cervical adenopathy.       Assessment:     Acute bronchitis. Suspect viral. Nonfocal exam    Plan:     -Recommend trial of over-the-counter Flonase or Nasacort for nasal congestive symptoms -Follow-up promptly for any fever or increasing shortness of breath  Eulas Post MD [facility]

## 2017-04-29 NOTE — Patient Instructions (Signed)
Suspect you have lingering symptoms from recent viral infection Consider OTC Flonase or Nasacort for nasal congestion Follow up promptly for any fever or increased shortness of breath.

## 2017-05-06 ENCOUNTER — Other Ambulatory Visit: Payer: Self-pay | Admitting: Internal Medicine

## 2017-08-26 ENCOUNTER — Encounter: Payer: Self-pay | Admitting: Internal Medicine

## 2017-08-26 ENCOUNTER — Ambulatory Visit: Payer: Federal, State, Local not specified - PPO | Admitting: Internal Medicine

## 2017-08-26 VITALS — BP 106/66 | HR 60 | Temp 98.0°F | Ht 72.0 in | Wt 190.0 lb

## 2017-08-26 DIAGNOSIS — M3213 Lung involvement in systemic lupus erythematosus: Secondary | ICD-10-CM | POA: Diagnosis not present

## 2017-08-26 DIAGNOSIS — E785 Hyperlipidemia, unspecified: Secondary | ICD-10-CM

## 2017-08-26 DIAGNOSIS — N401 Enlarged prostate with lower urinary tract symptoms: Secondary | ICD-10-CM | POA: Diagnosis not present

## 2017-08-26 DIAGNOSIS — F39 Unspecified mood [affective] disorder: Secondary | ICD-10-CM

## 2017-08-26 DIAGNOSIS — N138 Other obstructive and reflux uropathy: Secondary | ICD-10-CM

## 2017-08-26 DIAGNOSIS — J849 Interstitial pulmonary disease, unspecified: Secondary | ICD-10-CM

## 2017-08-26 MED ORDER — LOVASTATIN 20 MG PO TABS: 20.0000 mg | ORAL_TABLET | Freq: Every day | ORAL | 3 refills | Status: DC

## 2017-08-26 NOTE — Assessment & Plan Note (Signed)
Seems to be in remission on meds from Dr Trudie Reed

## 2017-08-26 NOTE — Assessment & Plan Note (Signed)
Quiet on the Rx

## 2017-08-26 NOTE — Assessment & Plan Note (Signed)
Doing well on the tamsulosin

## 2017-08-26 NOTE — Assessment & Plan Note (Signed)
He is still good with primary prevention Will defer labs since just had blood work this morning

## 2017-08-26 NOTE — Assessment & Plan Note (Signed)
Occasional anxiety Doing okay without any meds

## 2017-08-26 NOTE — Progress Notes (Signed)
Subjective:    Patient ID: Cody Arellano, male    DOB: Mar 04, 1946, 72 y.o.   MRN: [id number]  HPI Here for follow up of HLD and other chronic health conditions  Continues with Dr Trudie Reed for lupus and interstitial lung disease Breathing has been okay No chest pain  Still some anxiety Fairly frequent--but brief. Will be sullen---then it passes No medications for this now  No problems with statin No myalgias or GI problems  Using the tamsulosin Got this from the [place] Helping him void much better  Current Outpatient Medications on File Prior to Visit  Medication Sig Dispense Refill  . aspirin EC 81 MG tablet Take 1 tablet (81 mg total) by mouth daily.    [name] azaTHIOprine (IMURAN) 50 MG tablet Take 50 mg by mouth daily.   2  . hydroxychloroquine (PLAQUENIL) 200 MG tablet Take 200 mg by mouth at bedtime.    [name] ibuprofen (ADVIL,MOTRIN) 600 MG tablet Take 1 tablet (600 mg total) by mouth every 8 (eight) hours as needed. 15 tablet 0  . lovastatin (MEVACOR) 20 MG tablet Take 1 tablet (20 mg total) by mouth at bedtime. PLEASE SCHEDULE OFFICE VISIT 90 tablet 0  . tamsulosin (FLOMAX) 0.4 MG CAPS capsule      No current facility-administered medications on file prior to visit.     No Known Allergies  Past Medical History:  Diagnosis Date  . Colon polyps   . Coronary artery disease 2008/2009   MI with PCI x 2, then PCI x 1 in 2009  . Dyslipidemia   . Dysrhythmia    atrial fibrillation  . Hypertension   . Lupus    sees Dr Trudie Reed  . Lupus disease of the lung   . 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 Myocardial infarction ([place]) 2008  . Shortness of breath     Past Surgical History:  Procedure Laterality Date  . CARDIAC CATHETERIZATION    . coronary stents  2009    Family History  Problem Relation Age of Onset  . Heart disease Mother   . Alcohol abuse Father   . Hyperlipidemia Sister   . Hypertension Sister   . Hyperlipidemia Brother   . Hypertension Brother   . Cancer Brother      ?lung cancer  . Diabetes Paternal Uncle     Social History   Socioeconomic History  . Marital status: Married    Spouse name: Not on file  . Number of children: 5  . Years of education: Not on file  . Highest education level: Not on file  Occupational History  . 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 Financial resource strain: Not on file  . Food insecurity:    Worry: Not on file    Inability: Not on file  . Transportation needs:    Medical: Not on file    Non-medical: Not on file  Tobacco Use  . Smoking status: Former Smoker    Packs/day: 1.00    Years: 20.00    Pack years: 20.00    Types: Cigarettes    Last attempt to quit: 02/26/1993    Years since quitting: 24.5  . Smokeless tobacco: Never Used  . Tobacco comment: QUIT SMOKING 20 YEARS AGO  Substance and Sexual Activity  . Alcohol use: No  . Drug use: No  . Sexual activity: Not on file  Lifestyle  . Physical activity:    Days per week:  Not on file    Minutes per session: Not on file  . Stress: Not on file  Relationships  . Social connections:    Talks on phone: Not on file    Gets together: Not on file    Attends religious service: Not on file    Active member of club or organization: Not on file    Attends meetings of clubs or organizations: Not on file    Relationship status: Not on file  . Intimate partner violence:    Fear of current or ex partner: Not on file    Emotionally abused: Not on file    Physically abused: Not on file    Forced sexual activity: Not on file  Other Topics Concern  . Not on file  Social History Narrative   No living will   Wife should make health care decisions   Would accept resuscitation   Not sure about tube feeds   Review of Systems  Sleeps reasonably well--nocturia x 1-2 usually Appetite is good Weight is stable Walks regularly     Objective:   Physical Exam  Constitutional: He appears well-developed. No  distress.  Neck: No thyromegaly present.  Cardiovascular: Normal rate, regular rhythm, normal heart sounds and intact distal pulses. Exam reveals no gallop.  No murmur heard. Pulmonary/Chest: Effort normal and breath sounds normal. No respiratory distress. He has no wheezes. He has no rales.  Abdominal: Soft. There is no tenderness.  Musculoskeletal: He exhibits no edema or tenderness.  Lymphadenopathy:    He has no cervical adenopathy.  Psychiatric: He has a normal mood and affect. His behavior is normal.          Assessment & Plan:

## 2018-01-09 ENCOUNTER — Ambulatory Visit (INDEPENDENT_AMBULATORY_CARE_PROVIDER_SITE_OTHER): Payer: Federal, State, Local not specified - PPO | Admitting: Internal Medicine

## 2018-01-09 ENCOUNTER — Encounter: Payer: Self-pay | Admitting: Internal Medicine

## 2018-01-09 VITALS — BP 110/70 | HR 54 | Temp 97.7°F | Ht 70.0 in | Wt 169.0 lb

## 2018-01-09 DIAGNOSIS — Z7189 Other specified counseling: Secondary | ICD-10-CM | POA: Insufficient documentation

## 2018-01-09 DIAGNOSIS — N138 Other obstructive and reflux uropathy: Secondary | ICD-10-CM

## 2018-01-09 DIAGNOSIS — M3213 Lung involvement in systemic lupus erythematosus: Secondary | ICD-10-CM | POA: Diagnosis not present

## 2018-01-09 DIAGNOSIS — I2583 Coronary atherosclerosis due to lipid rich plaque: Secondary | ICD-10-CM

## 2018-01-09 DIAGNOSIS — N401 Enlarged prostate with lower urinary tract symptoms: Secondary | ICD-10-CM

## 2018-01-09 DIAGNOSIS — R413 Other amnesia: Secondary | ICD-10-CM | POA: Diagnosis not present

## 2018-01-09 DIAGNOSIS — I2510 Atherosclerotic heart disease of native coronary artery without angina pectoris: Secondary | ICD-10-CM

## 2018-01-09 DIAGNOSIS — Z0000 Encounter for general adult medical examination without abnormal findings: Secondary | ICD-10-CM

## 2018-01-09 NOTE — Assessment & Plan Note (Signed)
See social history 

## 2018-01-09 NOTE — Assessment & Plan Note (Signed)
I have personally reviewed the Medicare Annual Wellness questionnaire and have noted 1. The patient's medical and social history 2. Their use of alcohol, tobacco or illicit drugs 3. Their current medications and supplements 4. The patient's functional ability including ADL's, fall risks, home safety risks and hearing or visual             impairment. 5. Diet and physical activities 6. Evidence for depression or mood disorders  The patients weight, height, BMI and visual acuity have been recorded in the chart I have made referrals, counseling and provided education to the patient based review of the above and I have provided the pt with a written personalized care plan for preventive services.  I have provided you with a copy of your personalized plan for preventive services. Please take the time to review along with your updated medication list.  No PSA due to age Colon due 12/19 Discussed resistance exercise Yearly flu vaccine Consider shingrix

## 2018-01-09 NOTE — Patient Instructions (Signed)
Please stop the cholesterol medication (lovastatin). Let me know if your memory problems continue. I will consider an MRI and a referral to a neurologist.

## 2018-01-09 NOTE — Assessment & Plan Note (Signed)
History is concerning but does okay on the initial testing Will check some labs Stop the statin Consider MRI/neurology evaluation if worsening

## 2018-01-09 NOTE — Progress Notes (Signed)
Hearing Screening Comments: Has hearing aids. Not wearing today Vision Screening Comments: January 07, 2018

## 2018-01-09 NOTE — Assessment & Plan Note (Signed)
Has been controlled on meds Keeps up with rheumatologist

## 2018-01-09 NOTE — Progress Notes (Signed)
Subjective:    Patient ID: Cody Arellano, male    DOB: [birth year], 72 y.o.   MRN: [id number]  HPI Here for Medicare wellness visit and follow up of chronic health conditions Reviewed form and advanced directives Reviewed other doctors No alcohol or tobacco Does walk regularly---- uses treadmill in winter. Recommended resistance training Vision okay---yearly surveillance for plaquenil Hearing aides No fall this year (fell some years ago) No depression or anhedonia Independent with instrumental ADLs Some memory issues--forgets things, easily confused at times. Has cut down on driving  Had some oral surgery done a few months ago Hasn't been eating that much since then---smaller portions Weight down 20# He is happy with current weight  SLE fairly quiet Occasional mild flair Continues on plaquenil and azathioprine Dr Trudie Reed sees him for this  Voids okay on the flomax Nocturia usually once Seems to empty okay  No chest pain No SOB No dizziness or sycnope No edema  Current Outpatient Medications on File Prior to Visit  Medication Sig Dispense Refill  . aspirin EC 81 MG tablet Take 1 tablet (81 mg total) by mouth daily.    [name] azaTHIOprine (IMURAN) 50 MG tablet Take 50 mg by mouth daily.   2  . hydroxychloroquine (PLAQUENIL) 200 MG tablet Take 200 mg by mouth at bedtime.    [name] ibuprofen (ADVIL,MOTRIN) 600 MG tablet Take 1 tablet (600 mg total) by mouth every 8 (eight) hours as needed. 15 tablet 0  . lovastatin (MEVACOR) 20 MG tablet Take 1 tablet (20 mg total) by mouth at bedtime. 90 tablet 3  . tamsulosin (FLOMAX) 0.4 MG CAPS capsule      No current facility-administered medications on file prior to visit.     No Known Allergies  Past Medical History:  Diagnosis Date  . Colon polyps   . Coronary artery disease 2008/2009   MI with PCI x 2, then PCI x 1 in 2009  . Dyslipidemia   . Dysrhythmia    atrial fibrillation  . Hypertension   . Lupus [facility])    sees Dr Trudie Reed  .  Lupus disease of the lung   . 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 Myocardial infarction ([place]) 2008  . Shortness of breath     Past Surgical History:  Procedure Laterality Date  . CARDIAC CATHETERIZATION    . coronary stents  2009    Family History  Problem Relation Age of Onset  . Heart disease Mother   . Alcohol abuse Father   . Hyperlipidemia Sister   . Hypertension Sister   . Hyperlipidemia Brother   . Hypertension Brother   . Cancer Brother        ?lung cancer  . Diabetes Paternal Uncle     Social History   Socioeconomic History  . Marital status: Married    Spouse name: Not on file  . Number of children: 5  . Years of education: Not on file  . Highest education level: Not on file  Occupational History  . 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 Financial resource strain: Not on file  . Food insecurity:    Worry: Not on file    Inability: Not on file  . Transportation needs:    Medical: Not on file    Non-medical: Not on file  Tobacco Use  . Smoking status: Former Smoker    Packs/day: 1.00    Years: 20.00    Pack years:  20.00    Types: Cigarettes    Last attempt to quit: 02/26/1993    Years since quitting: 24.8  . Smokeless tobacco: Never Used  . Tobacco comment: QUIT SMOKING 20 YEARS AGO  Substance and Sexual Activity  . Alcohol use: No  . Drug use: No  . Sexual activity: Not on file  Lifestyle  . Physical activity:    Days per week: Not on file    Minutes per session: Not on file  . Stress: Not on file  Relationships  . Social connections:    Talks on phone: Not on file    Gets together: Not on file    Attends religious service: Not on file    Active member of club or organization: Not on file    Attends meetings of clubs or organizations: Not on file    Relationship status: Not on file  . Intimate partner violence:    Fear of current or ex partner: Not on file    Emotionally abused: Not on file     Physically abused: Not on file    Forced sexual activity: Not on file  Other Topics Concern  . Not on file  Social History Narrative   No living will   Wife should make health care decisions--alternate is sons   Would accept resuscitation   Not sure about tube feeds   Review of Systems Sleeps okay Recent oral surgery/extractions. Needs more done No rash or suspicious skin lesions Bowels slow always--no change (goes once a week). No blood No sig back or joint pains No heartburn or dysphagia Has been feeling cold lately    Objective:   Physical Exam  Constitutional: He is oriented to person, place, and time. He appears well-developed. No distress.  HENT:  Mouth/Throat: Oropharynx is clear and moist. No oropharyngeal exudate.  Neck: No thyromegaly present.  Cardiovascular: Normal rate, normal heart sounds and intact distal pulses. Exam reveals no gallop.  No murmur heard. Respiratory: Effort normal and breath sounds normal. No respiratory distress. He has no wheezes. He has no rales.  GI: Soft. There is no tenderness.  Musculoskeletal: He exhibits no edema or tenderness.  Lymphadenopathy:    He has no cervical adenopathy.  Neurological: He is alert and oriented to person, place, and time.  President--- "Trump, Obama, Clinton---Bush" [id number] Recall 3/3  Skin: No rash noted. No erythema.  Psychiatric: He has a normal mood and affect. His behavior is normal.           Assessment & Plan:

## 2018-01-09 NOTE — Assessment & Plan Note (Signed)
No recent symptoms

## 2018-01-09 NOTE — Assessment & Plan Note (Signed)
Does okay with the tamsulosin  

## 2018-01-10 ENCOUNTER — Other Ambulatory Visit: Payer: Self-pay | Admitting: Internal Medicine

## 2018-01-10 ENCOUNTER — Other Ambulatory Visit (INDEPENDENT_AMBULATORY_CARE_PROVIDER_SITE_OTHER): Payer: Federal, State, Local not specified - PPO

## 2018-01-10 DIAGNOSIS — E538 Deficiency of other specified B group vitamins: Secondary | ICD-10-CM

## 2018-01-10 LAB — COMPREHENSIVE METABOLIC PANEL
ALT: 5 U/L (ref 0–53)
AST: 10 U/L (ref 0–37)
Albumin: 3.5 g/dL (ref 3.5–5.2)
Alkaline Phosphatase: 78 U/L (ref 39–117)
BUN: 9 mg/dL (ref 6–23)
CALCIUM: 8.9 mg/dL (ref 8.4–10.5)
CHLORIDE: 108 meq/L (ref 96–112)
CO2: 25 meq/L (ref 19–32)
CREATININE: 0.93 mg/dL (ref 0.40–1.50)
GFR: 102.64 mL/min (ref >60.00)
GLUCOSE: 95 mg/dL (ref 70–99)
Potassium: 3.9 mEq/L (ref 3.5–5.1)
SODIUM: 140 meq/L (ref 135–145)
Total Bilirubin: 0.5 mg/dL (ref 0.2–1.2)
Total Protein: 7.1 g/dL (ref 6.0–8.3)

## 2018-01-10 LAB — CBC
HCT: 43.6 % (ref 39.0–52.0)
HEMOGLOBIN: 14.5 g/dL (ref 13.0–17.0)
MCHC: 33.3 g/dL (ref 30.0–36.0)
MCV: 97.9 fl (ref 78.0–100.0)
Platelets: 192 10*3/uL (ref 150.0–400.0)
RBC: 4.46 Mil/uL (ref 4.22–5.81)
RDW: 14.4 % (ref 11.5–15.5)
WBC: 5.6 10*3/uL (ref 4.0–10.5)

## 2018-01-10 LAB — T4, FREE: Free T4: 0.99 ng/dL (ref 0.60–1.60)

## 2018-01-10 LAB — VITAMIN B12: Vitamin B-12: 209 pg/mL — ABNORMAL LOW (ref 211–911)

## 2018-02-17 ENCOUNTER — Other Ambulatory Visit (INDEPENDENT_AMBULATORY_CARE_PROVIDER_SITE_OTHER): Payer: Federal, State, Local not specified - PPO

## 2018-02-17 DIAGNOSIS — E538 Deficiency of other specified B group vitamins: Secondary | ICD-10-CM

## 2018-02-17 LAB — VITAMIN B12: Vitamin B-12: 393 pg/mL (ref 211–911)

## 2018-02-28 ENCOUNTER — Telehealth: Payer: Self-pay | Admitting: Cardiovascular Disease

## 2018-02-28 NOTE — Telephone Encounter (Signed)
Walk in pt Form-VA paperwork dropped off. Placed in [place] doc box.

## 2018-03-04 ENCOUNTER — Encounter: Payer: Self-pay | Admitting: Cardiovascular Disease

## 2018-03-05 ENCOUNTER — Ambulatory Visit: Payer: Federal, State, Local not specified - PPO | Admitting: Cardiovascular Disease

## 2018-03-05 ENCOUNTER — Encounter: Payer: Self-pay | Admitting: Cardiovascular Disease

## 2018-03-05 VITALS — BP 116/64 | HR 61 | Ht 70.0 in | Wt 171.4 lb

## 2018-03-05 DIAGNOSIS — I2510 Atherosclerotic heart disease of native coronary artery without angina pectoris: Secondary | ICD-10-CM | POA: Diagnosis not present

## 2018-03-05 NOTE — Progress Notes (Signed)
Cardiology Office Note   Date:  03/05/2018   ID:  Jacquelyn Antony, DOB 1946/04/22, MRN [id number]  PCP:  Venia Carbon, MD  Cardiologist:   Mertie Moores, MD   Chief Complaint  Patient presents with  . Coronary Artery Disease  . Hyperlipidemia   1. CAD - mi in 2008 2. Lupus Pericarditis 3. Hypertension 4. Hyperlipidemia   Sept. 8, 2015:  Eliseo was seen in Nov. 2014 for follow up of his Lupus pericarditis.  Barry has had significant worsening of DOE since I last saw him. He has not had to take any nitroglycerin. He does think the symptoms are very similar to when he had his MI.  He denies any PND orthopnea. He denies any pedal edema. He tries to avoid salt but it makes it he eats some salt on occasion.   August 10, 2014 Damarcus Reggio is a 72 y.o. 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 He also has a history of coronary artery disease, hypertension, and hyperlipidemia.  Has bilateral foot burning - not necessarily with ambulation No CP ,  Watches his diet Walks several miles a day 4-5 times a week.   August 24, 2015:  Has remained active.   No CP or dyspnea . Walks regularly  No additional symptoms of pericarditis   Oct. 2,2019:  Mr. Gervin is seen back today after a 2-1/2-year absence.  He has a history of lupus pericarditis.  He also has a history of coronary artery disease  Her symptoms have a history of an acute myocardial infarction in 2008 at which time he got 2 stents.  Follow-up In 2009 resulted in placement of one additional stent.  He was hospitalized in February in [place] for acute pericarditis as a complication of his lupus.  Seen 2014 with atrial fibrillation.  He was thought to have pericarditis as well at that time.  Recently , he has done well .   No recent pericarditis.  Is on Plaquenil and Imuran  for his Lupus.  Has had Lupus pericardiits twice.  Takes Motrin 600 TIE when he has pericarditis.   Has rare episodes of CP - usually  when he wakes up in the am. Typically goes away when he stops and rests.   Feels like an early symptom of when he had the MI .   Tries to walk 3-4 days a week - walks 2 miles  No CP with walking    Has some balance issues.   Is not on any cardiac meds.    Past Medical History:  Diagnosis Date  . Colon polyps   . Coronary artery disease 2008/2009   MI with PCI x 2, then PCI x 1 in 2009  . Dyslipidemia   . Dysrhythmia    atrial fibrillation  . Hypertension   . Lupus [facility])    sees Dr Trudie Reed  . Lupus disease of the lung   . 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 Myocardial infarction ([place]) 2008  . Shortness of breath     Past Surgical History:  Procedure Laterality Date  . CARDIAC CATHETERIZATION    . coronary stents  2009     Current Outpatient Medications  Medication Sig Dispense Refill  . aspirin EC 81 MG tablet Take 1 tablet (81 mg total) by mouth daily.    [name] azaTHIOprine (IMURAN) 50 MG tablet Take 50 mg by mouth daily.   2  . hydroxychloroquine (PLAQUENIL) 200 MG tablet Take 200 mg by mouth  at bedtime.    [name] ibuprofen (ADVIL,MOTRIN) 600 MG tablet Take 1 tablet (600 mg total) by mouth every 8 (eight) hours as needed. 15 tablet 0  . tamsulosin (FLOMAX) 0.4 MG CAPS capsule      No current facility-administered medications for this visit.     Allergies:   Patient has no known allergies.    Social History:  The patient  reports that he quit smoking about 25 years ago. His smoking use included cigarettes. He has a 20.00 pack-year smoking history. He has never used smokeless tobacco. He reports that he does not drink alcohol or use drugs.   Family History:  The patient's family history includes Alcohol abuse in his father; Cancer in his brother; Diabetes in his paternal uncle; Heart disease in his mother; Hyperlipidemia in his brother and sister; Hypertension in his brother and sister.    ROS:  Please see the history of present illness.       Physical Exam: Blood pressure  116/64, pulse 61, height 5\' 10"  (1.778 m), weight 171 lb 6.4 oz (77.7 kg), SpO2 95 %.  GEN:  Well nourished, well developed in no acute distress HEENT: Normal NECK: No JVD; No carotid bruits LYMPHATICS: No lymphadenopathy CARDIAC: RRR , no murmurs, rubs, gallops RESPIRATORY:  Clear to auscultation without rales, wheezing or rhonchi  ABDOMEN: Soft, non-tender, non-distended MUSCULOSKELETAL:  No edema; No deformity  SKIN: Warm and dry NEUROLOGIC:  Alert and oriented x 3   EKG:   March 05, 2018: Normal sinus rhythm at 61 beats minute.  Nonspecific T wave abnormality  Recent Labs: 01/10/2018: ALT 5; BUN 9; Creatinine, Ser 0.93; Hemoglobin 14.5; Platelets 192.0; Potassium 3.9; Sodium 140    Lipid Panel    Component Value Date/Time   CHOL 125 01/17/2016 1002   TRIG 59.0 01/17/2016 1002   HDL 39.80 01/17/2016 1002   CHOLHDL 3 01/17/2016 1002   VLDL 11.8 01/17/2016 1002   LDLCALC 73 01/17/2016 1002      Wt Readings from Last 3 Encounters:  03/05/18 171 lb 6.4 oz (77.7 kg)  01/09/18 169 lb (76.7 kg)  08/26/17 190 lb (86.2 kg)      Other studies Reviewed: Additional studies/ records that were reviewed today include: . Review of the above records demonstrates:    ASSESSMENT AND PLAN:  1.  CAD :  He had a stent in 2008. Will DC plavix and start ASA He has been stable. No symptoms   2. Hyperlipidemia:    3. Peripheral neuropathy.  5. Lupus :   On meds.   Doing well.   No episodes of pericarditis since 2014.   Will see him in 1 year .   Current medicines are reviewed at length with the patient today.  The patient does not have concerns regarding medicines.  The following changes have been made:  no change    Signed, Mertie Moores, MD  03/05/2018 3:50 PM    [facility] [address], [place], [place]  [address] Phone: [phone]; Fax: [phone]

## 2018-03-05 NOTE — Patient Instructions (Signed)
Medication Instructions:  Your physician recommends that you continue on your current medications as directed. Please refer to the Current Medication list given to you today.   Labwork: TODAY - cholesterol, liver panel, basic metabolic panel   Testing/Procedures: None Ordered   Follow-Up: Your physician wants you to follow-up in: 1 year with Dr. Nahser.  You will receive a reminder letter in the mail two months in advance. If you don't receive a letter, please call our office to schedule the follow-up appointment.   If you need a refill on your cardiac medications before your next appointment, please call your pharmacy.   Thank you for choosing [facility]! Michelle Swinyer, RN [phone]    

## 2018-03-06 LAB — LIPID PANEL
CHOLESTEROL TOTAL: 182 mg/dL (ref 100–199)
Chol/HDL Ratio: 4 ratio (ref 0.0–5.0)
HDL: 45 mg/dL (ref >39)
LDL Calculated: 122 mg/dL — ABNORMAL HIGH (ref 0–99)
Triglycerides: 73 mg/dL (ref 0–149)
VLDL CHOLESTEROL CAL: 15 mg/dL (ref 5–40)

## 2018-03-10 ENCOUNTER — Telehealth: Payer: Self-pay | Admitting: Nurse Practitioner

## 2018-03-10 ENCOUNTER — Telehealth: Payer: Self-pay | Admitting: Internal Medicine

## 2018-03-10 DIAGNOSIS — E785 Hyperlipidemia, unspecified: Secondary | ICD-10-CM

## 2018-03-10 MED ORDER — ROSUVASTATIN CALCIUM 10 MG PO TABS: 10.0000 mg | ORAL_TABLET | Freq: Every day | ORAL | 3 refills | Status: DC

## 2018-03-10 NOTE — Telephone Encounter (Signed)
-----   Message from Thayer Headings, MD sent at 03/06/2018  6:20 AM EDT ----- His LDL is higher. Start rosuvastatin 10 mg a day  Check BMP, liver enz. Lipids in 3 months

## 2018-03-10 NOTE — Telephone Encounter (Signed)
Results and plan of care reviewed with patient who verbalized understanding and agreement with plan. He is scheduled for repeat lab appointment on 06/12/18. I advised him to call back prior to appointment with questions or concerns and he thanked me for the call.

## 2018-03-10 NOTE — Telephone Encounter (Signed)
Pt dropped off form to be filled out for [place]. Advised pt he may have to schedule an appointment if he hasn't been seen for migraines previously. Placed form in [place] tower

## 2018-03-11 NOTE — Telephone Encounter (Signed)
Patient scheduled appointment on 03/12/18 at 10:15.

## 2018-03-11 NOTE — Telephone Encounter (Signed)
Dr Silvio Pate said we need a 15 min appt scheduled to discuss the forms. Can you schedule him? You can use a same day if there are several available in a day. Thanks. I will hold the forms at my desk. Thanks.

## 2018-03-12 ENCOUNTER — Ambulatory Visit: Payer: Federal, State, Local not specified - PPO | Admitting: Internal Medicine

## 2018-03-12 ENCOUNTER — Encounter: Payer: Self-pay | Admitting: Internal Medicine

## 2018-03-12 VITALS — BP 98/68 | HR 59 | Temp 98.0°F | Ht 70.0 in | Wt 170.0 lb

## 2018-03-12 DIAGNOSIS — G44229 Chronic tension-type headache, not intractable: Secondary | ICD-10-CM | POA: Insufficient documentation

## 2018-03-12 DIAGNOSIS — Z23 Encounter for immunization: Secondary | ICD-10-CM | POA: Diagnosis not present

## 2018-03-12 NOTE — Assessment & Plan Note (Signed)
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

## 2018-03-12 NOTE — Progress Notes (Signed)
Subjective:    Patient ID: Cody Arellano, male    DOB: 1945-07-14, 72 y.o.   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 aspirin EC 81 MG tablet Take 1 tablet (81 mg total) by mouth daily.    [name] azaTHIOprine (IMURAN) 50 MG tablet Take 50 mg by mouth daily.   2  . hydroxychloroquine (PLAQUENIL) 200 MG tablet Take 200 mg by mouth at bedtime.    [name] ibuprofen (ADVIL,MOTRIN) 600 MG tablet Take 1 tablet (600 mg total) by mouth every 8 (eight) hours as needed. 15 tablet 0  . rosuvastatin (CRESTOR) 10 MG tablet Take 1 tablet (10 mg total) by mouth daily. 90 tablet 3  . tamsulosin (FLOMAX) 0.4 MG CAPS capsule      No current facility-administered medications on file prior to visit.     No Known Allergies  Past Medical History:  Diagnosis Date  . Colon polyps   . Coronary artery disease 2008/2009   MI with PCI x 2, then PCI x 1 in 2009  . Dyslipidemia   . Dysrhythmia    atrial fibrillation  . Hypertension   . Lupus [facility])    sees Dr Trudie Reed  . Lupus disease of the lung   . 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 Myocardial infarction ([place]) 2008  . Shortness of breath     Past Surgical History:  Procedure Laterality Date  . CARDIAC CATHETERIZATION    . coronary stents  2009    Family History  Problem Relation Age of Onset  . Heart disease Mother   . Alcohol abuse Father   . Hyperlipidemia Sister   . Hypertension Sister   . Hyperlipidemia Brother   . Hypertension Brother   . Cancer Brother        ?lung cancer  . Diabetes Paternal Uncle     Social History    Socioeconomic History  . Marital status: Married    Spouse name: Not on file  . Number of children: 5  . Years of education: Not on file  . Highest education level: Not on file  Occupational History  . 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 Financial resource strain: Not on file  . Food insecurity:    Worry: Not on file    Inability: Not on file  . Transportation needs:    Medical: Not on file    Non-medical: Not on file  Tobacco Use  . Smoking status: Former Smoker    Packs/day: 1.00    Years: 20.00    Pack years: 20.00    Types: Cigarettes    Last attempt to quit: 02/26/1993    Years since quitting: 25.0  . Smokeless tobacco: Never Used  . Tobacco comment: QUIT SMOKING 20 YEARS AGO  Substance and Sexual Activity  . Alcohol use: No  . Drug use: No  . Sexual activity: Not on file  Lifestyle  . Physical activity:    Days per week: Not on file  Minutes per session: Not on file  . Stress: Not on file  Relationships  . Social connections:    Talks on phone: Not on file    Gets together: Not on file    Attends religious service: Not on file    Active member of club or organization: Not on file    Attends meetings of clubs or organizations: Not on file    Relationship status: Not on file  . Intimate partner violence:    Fear of current or ex partner: Not on file    Emotionally abused: Not on file    Physically abused: Not on file    Forced sexual activity: Not on file  Other Topics Concern  . Not on file  Social History Narrative   No living will   Wife should make health care decisions--alternate is sons   Would accept resuscitation   Not sure about tube feeds   Review of Systems  Generally sleeps fair--- will awaken 2 times most night (can usually go back to sleep after prayer, etc) Appetite is not great--but tries to eat regular Weight has stabilized over the past couple of months at least      Objective:   Physical Exam  Constitutional: He appears well-developed. No distress.  Psychiatric: He has a normal mood and affect. His behavior is normal.           Assessment & Plan:

## 2018-03-12 NOTE — Addendum Note (Signed)
Addended by: Pilar Grammes on: 03/12/2018 11:03 AM   Modules accepted: Orders

## 2018-06-06 ENCOUNTER — Telehealth: Payer: Self-pay | Admitting: Internal Medicine

## 2018-06-06 DIAGNOSIS — Z86010 Personal history of colonic polyps: Secondary | ICD-10-CM

## 2018-06-06 NOTE — Telephone Encounter (Signed)
Pt stated it's time for him to have his colonoscopy done and he need a referral. Please advise.

## 2018-06-07 NOTE — Telephone Encounter (Signed)
Please let him know I put in the referral and he should hear from Dr [facility] office soon

## 2018-06-09 ENCOUNTER — Encounter: Payer: Self-pay | Admitting: Internal Medicine

## 2018-06-09 NOTE — Telephone Encounter (Signed)
Spoke to pt

## 2018-06-12 ENCOUNTER — Other Ambulatory Visit: Payer: Federal, State, Local not specified - PPO | Admitting: *Deleted

## 2018-06-12 DIAGNOSIS — E785 Hyperlipidemia, unspecified: Secondary | ICD-10-CM

## 2018-06-13 LAB — BASIC METABOLIC PANEL
BUN / CREAT RATIO: 9 — AB (ref 10–24)
BUN: 8 mg/dL (ref 8–27)
CALCIUM: 8.5 mg/dL — AB (ref 8.6–10.2)
CO2: 22 mmol/L (ref 20–29)
Chloride: 106 mmol/L (ref 96–106)
Creatinine, Ser: 0.91 mg/dL (ref 0.76–1.27)
GFR calc Af Amer: 97 mL/min/{1.73_m2} (ref >59)
GFR calc non Af Amer: 84 mL/min/{1.73_m2} (ref >59)
GLUCOSE: 78 mg/dL (ref 65–99)
Potassium: 4.1 mmol/L (ref 3.5–5.2)
Sodium: 142 mmol/L (ref 134–144)

## 2018-06-13 LAB — LIPID PANEL
Chol/HDL Ratio: 2.4 ratio (ref 0.0–5.0)
Cholesterol, Total: 110 mg/dL (ref 100–199)
HDL: 45 mg/dL (ref >39)
LDL Calculated: 54 mg/dL (ref 0–99)
Triglycerides: 55 mg/dL (ref 0–149)
VLDL Cholesterol Cal: 11 mg/dL (ref 5–40)

## 2018-06-13 LAB — HEPATIC FUNCTION PANEL
ALK PHOS: 82 IU/L (ref 39–117)
ALT: 7 IU/L (ref 0–44)
AST: 13 IU/L (ref 0–40)
Albumin: 3.4 g/dL — ABNORMAL LOW (ref 3.5–4.8)
BILIRUBIN TOTAL: 0.3 mg/dL (ref 0.0–1.2)
BILIRUBIN, DIRECT: 0.11 mg/dL (ref 0.00–0.40)
TOTAL PROTEIN: 6.4 g/dL (ref 6.0–8.5)

## 2018-06-30 ENCOUNTER — Encounter: Payer: Self-pay | Admitting: Internal Medicine

## 2018-06-30 ENCOUNTER — Ambulatory Visit (AMBULATORY_SURGERY_CENTER): Payer: Self-pay

## 2018-06-30 VITALS — Ht 72.0 in | Wt 163.0 lb

## 2018-06-30 DIAGNOSIS — Z86010 Personal history of colonic polyps: Secondary | ICD-10-CM

## 2018-06-30 MED ORDER — NA SULFATE-K SULFATE-MG SULF 17.5-3.13-1.6 GM/177ML PO SOLN: 1.0000 | Freq: Once | ORAL | 0 refills | Status: AC

## 2018-06-30 NOTE — Progress Notes (Signed)
Denies allergies to eggs or soy products. Denies complication of anesthesia or sedation. Denies use of weight loss medication. Denies use of O2.   Emmi instructions declined.   A 15.00 coupon for Suprep was given to the patient. Patient states that he is not on Medicare.

## 2018-07-14 ENCOUNTER — Ambulatory Visit (AMBULATORY_SURGERY_CENTER): Payer: Federal, State, Local not specified - PPO | Admitting: Internal Medicine

## 2018-07-14 ENCOUNTER — Encounter: Payer: Self-pay | Admitting: Internal Medicine

## 2018-07-14 VITALS — BP 135/63 | HR 49 | Temp 98.7°F | Resp 11 | Ht 70.0 in | Wt 170.0 lb

## 2018-07-14 DIAGNOSIS — Z86010 Personal history of colonic polyps: Secondary | ICD-10-CM

## 2018-07-14 MED ORDER — SODIUM CHLORIDE 0.9 % IV SOLN: 500.0000 mL | Freq: Once | INTRAVENOUS | Status: DC

## 2018-07-14 NOTE — Progress Notes (Signed)
Pt's states no medical or surgical changes since previsit or office visit. 

## 2018-07-14 NOTE — Progress Notes (Signed)
To PACU, VSS. Report to PACU, tb

## 2018-07-14 NOTE — Patient Instructions (Signed)
Discharge instructions given. Handouts on Diverticulosis and Hemorrhoids. Resume previous medications. YOU HAD AN ENDOSCOPIC PROCEDURE TODAY AT [facility]:   Refer to the procedure report that was given to you for any specific questions about what was found during the examination.  If the procedure report does not answer your questions, please call your gastroenterologist to clarify.  If you requested that your care partner not be given the details of your procedure findings, then the procedure report has been included in a sealed envelope for you to review at your convenience later.  YOU SHOULD EXPECT: Some feelings of bloating in the abdomen. Passage of more gas than usual.  Walking can help get rid of the air that was put into your GI tract during the procedure and reduce the bloating. If you had a lower endoscopy (such as a colonoscopy or flexible sigmoidoscopy) you may notice spotting of blood in your stool or on the toilet paper. If you underwent a bowel prep for your procedure, you may not have a normal bowel movement for a few days.  Please Note:  You might notice some irritation and congestion in your nose or some drainage.  This is from the oxygen used during your procedure.  There is no need for concern and it should clear up in a day or so.  SYMPTOMS TO REPORT IMMEDIATELY:   Following lower endoscopy (colonoscopy or flexible sigmoidoscopy):  Excessive amounts of blood in the stool  Significant tenderness or worsening of abdominal pains  Swelling of the abdomen that is new, acute  Fever of 100F or higher   For urgent or emergent issues, a gastroenterologist can be reached at any hour by calling [phone].   DIET:  We do recommend a small meal at first, but then you may proceed to your regular diet.  Drink plenty of fluids but you should avoid alcoholic beverages for 24 hours.  ACTIVITY:  You should plan to take it easy for the rest of today and you should  NOT DRIVE or use heavy machinery until tomorrow (because of the sedation medicines used during the test).    FOLLOW UP: Our staff will call the number listed on your records the next business day following your procedure to check on you and address any questions or concerns that you may have regarding the information given to you following your procedure. If we do not reach you, we will leave a message.  However, if you are feeling well and you are not experiencing any problems, there is no need to return our call.  We will assume that you have returned to your regular daily activities without incident.  If any biopsies were taken you will be contacted by phone or by letter within the next 1-3 weeks.  Please call us at [phone] if you have not heard about the biopsies in 3 weeks.    SIGNATURES/CONFIDENTIALITY: You and/or your care partner have signed paperwork which will be entered into your electronic medical record.  These signatures attest to the fact that that the information above on your After Visit Summary has been reviewed and is understood.  Full responsibility of the confidentiality of this discharge information lies with you and/or your care-partner.

## 2018-07-14 NOTE — Op Note (Signed)
[place] Patient Name: Cody Arellano Procedure Date: 07/14/2018 8:36 AM MRN: [id number] Endoscopist: Docia Chuck. Henrene Pastor , MD Age: 73 Referring MD:  Date of Birth: 14-Apr-1946 Gender: Male Account #: [id number] Procedure:                Colonoscopy Indications:              High risk colon cancer surveillance: Personal                            history of adenomas. Index exam in [place]. Last                            examination here December 2014 with small tubular                            adenoma Medicines:                Monitored Anesthesia Care Procedure:                Pre-Anesthesia Assessment:                           - Prior to the procedure, a History and Physical                            was performed, and patient medications and                            allergies were reviewed. The patient's tolerance of                            previous anesthesia was also reviewed. The risks                            and benefits of the procedure and the sedation                            options and risks were discussed with the patient.                            All questions were answered, and informed consent                            was obtained. Prior Anticoagulants: The patient has                            taken no previous anticoagulant or antiplatelet                            agents. ASA Grade Assessment: II - A patient with                            mild systemic disease. After reviewing the risks  and benefits, the patient was deemed in                            satisfactory condition to undergo the procedure.                           After obtaining informed consent, the colonoscope                            was passed under direct vision. Throughout the                            procedure, the patient's blood pressure, pulse, and                            oxygen saturations were monitored continuously. The                  Model CF-HQ190L [id number]) scope was introduced                            through the anus and advanced to the the cecum,                            identified by appendiceal orifice and ileocecal                            valve. The ileocecal valve, appendiceal orifice,                            and rectum were photographed. The quality of the                            bowel preparation was good. The colonoscopy was                            performed without difficulty. The patient tolerated                            the procedure well. The bowel preparation used was                            SUPREP. Scope In: 8:43:15 AM Scope Out: 8:58:56 AM Scope Withdrawal Time: 0 hours 10 minutes 38 seconds  Total Procedure Duration: 0 hours 15 minutes 41 seconds  Findings:                 Multiple small and large-mouthed diverticula were                            found in the right colon.                           Internal hemorrhoids were found during  retroflexion. The hemorrhoids were small.                           The exam was otherwise without abnormality on                            direct and retroflexion views. Complications:            No immediate complications. Estimated blood loss:                            None. Estimated Blood Loss:     Estimated blood loss: none. Impression:               - Diverticulosis in the right colon.                           - Internal hemorrhoids.                           - The examination was otherwise normal on direct                            and retroflexion views.                           - No specimens collected. Recommendation:           - Repeat colonoscopy in 5 years for surveillance.                           - Patient has a contact number available for                            emergencies. The signs and symptoms of potential                            delayed complications were  discussed with the                            patient. Return to normal activities tomorrow.                            Written discharge instructions were provided to the                            patient.                           - Resume previous diet.                           - Continue present medications. Docia Chuck. Henrene Pastor, MD 07/14/2018 9:03:23 AM This report has been signed electronically.

## 2018-07-15 ENCOUNTER — Telehealth: Payer: Self-pay

## 2018-07-15 NOTE — Telephone Encounter (Signed)
Called [phone] and left a messaged we tried to reach pt for a follow up call. maw

## 2018-07-15 NOTE — Telephone Encounter (Signed)
  Follow up Call-  Call back number 07/14/2018  Post procedure Call Back phone  # [phone]  Permission to leave phone message Yes  Some recent data might be hidden     Patient questions:  Do you have a fever, pain , or abdominal swelling? No. Pain Score  0 *  Have you tolerated food without any problems? Yes.    Have you been able to return to your normal activities? Yes.    Do you have any questions about your discharge instructions: Diet   No. Medications  No. Follow up visit  No.  Do you have questions or concerns about your Care? No.  Actions: * If pain score is 4 or above: No action needed, pain <4.

## 2018-09-22 ENCOUNTER — Ambulatory Visit (INDEPENDENT_AMBULATORY_CARE_PROVIDER_SITE_OTHER): Payer: Federal, State, Local not specified - PPO | Admitting: Family Medicine

## 2018-09-22 DIAGNOSIS — J3489 Other specified disorders of nose and nasal sinuses: Secondary | ICD-10-CM | POA: Diagnosis not present

## 2018-09-22 NOTE — Progress Notes (Signed)
Virtual visit completed through WebEx or similar program Patient location: home  Provider location: [place] at [facility], office   Limitations and rationale for visit method d/w patient.  Patient agreed to proceed.   CC: cough.    HPI:  He doesn't feel unwell but had temp 99.5 last week at rheum clinic.  He didn't think he had a fever o/w.  Since then he had some nasal congestion and rhinorrhea. No SOB or S/T; No travel or known exposure to covid or flu.   Minimal cough, only rarely.  Rare sputum, usually a dry cough.  Some throat clearing.  Sputum isn't discolored.  Wife and son and daughter live with patient.    Meds and allergies reviewed.   ROS: Per HPI unless specifically indicated in ROS section   NAD Speech wnl  A/P: mild URI sx.  Wouldn't need testing at this point. He agrees.  Continue as is.

## 2018-09-23 DIAGNOSIS — J3489 Other specified disorders of nose and nasal sinuses: Secondary | ICD-10-CM | POA: Insufficient documentation

## 2018-09-23 NOTE — Assessment & Plan Note (Signed)
mild URI sx.  D/w pt about options and his situation.  Wouldn't need testing at this point. He agrees.  Continue as is.  It may be more hazardous for the patient to come to the clinic or hospital for testing than to just stay at home given his mild symptoms.  He can update [place] as needed.

## 2019-01-09 ENCOUNTER — Other Ambulatory Visit: Payer: Self-pay | Admitting: Cardiovascular Disease

## 2019-01-20 ENCOUNTER — Encounter: Payer: Self-pay | Admitting: Internal Medicine

## 2019-01-20 ENCOUNTER — Other Ambulatory Visit: Payer: Self-pay

## 2019-01-20 ENCOUNTER — Ambulatory Visit (INDEPENDENT_AMBULATORY_CARE_PROVIDER_SITE_OTHER): Payer: Federal, State, Local not specified - PPO | Admitting: Internal Medicine

## 2019-01-20 ENCOUNTER — Telehealth: Payer: Self-pay | Admitting: Internal Medicine

## 2019-01-20 VITALS — BP 122/70 | Temp 99.1°F | Wt 165.0 lb

## 2019-01-20 DIAGNOSIS — I480 Paroxysmal atrial fibrillation: Secondary | ICD-10-CM

## 2019-01-20 DIAGNOSIS — I2510 Atherosclerotic heart disease of native coronary artery without angina pectoris: Secondary | ICD-10-CM | POA: Diagnosis not present

## 2019-01-20 DIAGNOSIS — F39 Unspecified mood [affective] disorder: Secondary | ICD-10-CM

## 2019-01-20 DIAGNOSIS — N401 Enlarged prostate with lower urinary tract symptoms: Secondary | ICD-10-CM

## 2019-01-20 DIAGNOSIS — N138 Other obstructive and reflux uropathy: Secondary | ICD-10-CM

## 2019-01-20 DIAGNOSIS — Z7189 Other specified counseling: Secondary | ICD-10-CM

## 2019-01-20 DIAGNOSIS — M3213 Lung involvement in systemic lupus erythematosus: Secondary | ICD-10-CM

## 2019-01-20 DIAGNOSIS — Z0000 Encounter for general adult medical examination without abnormal findings: Secondary | ICD-10-CM

## 2019-01-20 NOTE — Assessment & Plan Note (Signed)
Okay with tamsulosin

## 2019-01-20 NOTE — Assessment & Plan Note (Signed)
I have personally reviewed the Medicare Annual Wellness questionnaire and have noted 1. The patient's medical and social history 2. Their use of alcohol, tobacco or illicit drugs 3. Their current medications and supplements 4. The patient's functional ability including ADL's, fall risks, home safety risks and hearing or visual             impairment. 5. Diet and physical activities 6. Evidence for depression or mood disorders  The patients weight, height, BMI and visual acuity have been recorded in the chart I have made referrals, counseling and provided education to the patient based review of the above and I have provided the pt with a written personalized care plan for preventive services.  I have provided you with a copy of your personalized plan for preventive services. Please take the time to review along with your updated medication list.  Thinks he had shingrix--- he will check Flu vaccine soon Recent colon done--no more screening probably No PSA due to age Discussed ftiness and resistance training

## 2019-01-20 NOTE — Progress Notes (Addendum)
Subjective:    Patient ID: Cody Arellano, male    DOB: 1945/12/11, 73 y.o.   MRN: [id number]  HPI Virtual visit for annual preventative visit and follow up of chronic health conditions Identification done Reviewed billing and he gave consent He is at home and I am in my office  Reviewed advanced directives Reviewed other doctors--- Dr Philis Pique, Dr Clementeen Hoof PCP, dentist?, Dr Nahser--cardiology No alcohol or tobacco No hospitalizations or surgery in past year No falls Chronic mood problems--no change Vision is okay Hearing aides--they help Independent with instrumental ADLs Ongoing mild memory issues---no functional problems but trouble retaining technical information  Continues to see Dr Trudie Reed for SLE/lung disease Keeps up with routine eye exams for the plaquenil This has been under control  Continues on the sertraline Feels he is getting used to the [employer]	[place] to golf range and some shopping Only episodic depression and not anhedonic Some degree of anxiety---restless at times still  Still notes some memory problems Taking B12 and got something from the [place] doctor 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 aspirin EC 81 MG tablet Take 1 tablet (81 mg total) by mouth daily.    [name] azaTHIOprine (IMURAN) 50 MG tablet Take 50 mg by mouth daily.   2  . hydroxychloroquine (PLAQUENIL) 200 MG tablet Take 200 mg by mouth at bedtime.    [name] ibuprofen (ADVIL,MOTRIN) 600 MG tablet Take 1 tablet (600 mg total) by mouth every 8 (eight) hours as needed. 15 tablet 0  . OVER THE COUNTER MEDICATION  Vitamin B 12 500 mcq. One tablet daily.    . rosuvastatin (CRESTOR) 10 MG tablet Take 1 tablet (10 mg total) by mouth daily. Please schedule appt for any future refills. 1st attempt 90 tablet 0  . sertraline (ZOLOFT) 50 MG tablet Take 50 mg by mouth daily.    . tamsulosin (FLOMAX) 0.4 MG CAPS capsule      No current facility-administered medications on file prior to visit.     No Known Allergies  Past Medical History:  Diagnosis Date  . Anxiety   . Chronic tension headache   . Colon polyps   . Coronary artery disease 2008/2009   MI with PCI x 2, then PCI x 1 in 2009  . Depression   . Dyslipidemia   . Dysrhythmia    atrial fibrillation  . Hyperlipidemia   . Hypertension   . Lupus [facility])    sees Dr Trudie Reed  . Lupus disease of the lung   . 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 Myocardial infarction ([place]) 2008  . Shortness of breath     Past Surgical History:  Procedure Laterality Date  . CARDIAC CATHETERIZATION    . coronary stents  2009    Family History  Problem Relation Age of Onset  . Heart disease Mother   . Alcohol abuse Father   . Hyperlipidemia Sister   . Hypertension Sister   . Hyperlipidemia Brother   . Hypertension Brother   . Cancer Brother        ?lung cancer  . Stomach cancer Brother   .  Diabetes Paternal Uncle   . Colon cancer Neg Hx   . Esophageal cancer Neg Hx   . Rectal cancer Neg Hx     Social History   Socioeconomic History  . Marital status: Married    Spouse name: Not on file  . Number of children: 5  . Years of education: Not on file  . Highest education level: Not on file  Occupational History  . 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 Financial resource strain: Not on file  . Food insecurity    Worry: Not on file    Inability: Not on file  . Transportation needs    Medical: Not on file    Non-medical: Not on file  Tobacco Use  . Smoking status: Former Smoker    Packs/day: 1.00     Years: 20.00    Pack years: 20.00    Types: Cigarettes    Quit date: 02/26/1993    Years since quitting: 25.9  . Smokeless tobacco: Never Used  . Tobacco comment: QUIT SMOKING 20 YEARS AGO  Substance and Sexual Activity  . Alcohol use: No  . Drug use: No  . Sexual activity: Not on file  Lifestyle  . Physical activity    Days per week: Not on file    Minutes per session: Not on file  . Stress: Not on file  Relationships  . Social Herbalist on phone: Not on file    Gets together: Not on file    Attends religious service: Not on file    Active member of club or organization: Not on file    Attends meetings of clubs or organizations: Not on file    Relationship status: Not on file  . Intimate partner violence    Fear of current or ex partner: Not on file    Emotionally abused: Not on file    Physically abused: Not on file    Forced sexual activity: Not on file  Other Topics Concern  . Not on file  Social History Narrative   No living will   Wife should make health care decisions--alternate is sons   Would accept resuscitation   Not sure about tube feeds   Review of Systems Appetite is good Weight is fairly stable Sleeps fair--up 2-3 times for nocturia Bowels are fine--no blood No sig back or joint pain Has needed some oral surgery --- may need some dentures eventually (replacement) No skin lesions or rash--doesn't see derm Wears seat belt No heartburn or dysphagia Only intermittent headaches    Objective:   Physical Exam  Constitutional: He is oriented to person, place, and time. He appears well-developed. No distress.  Respiratory: Effort normal. No respiratory distress.  Neurological: He is alert and oriented to person, place, and time.  President-- "Daisy Floro, Obama, Bush" [phone] D-l-[id number]-w Recall 3/3  Skin: No rash noted. No erythema.  Psychiatric: He has a normal mood and affect. His behavior is normal.           Assessment &  Plan:

## 2019-01-20 NOTE — Assessment & Plan Note (Signed)
No documented recurrence since MI No Rx for now

## 2019-01-20 NOTE — Assessment & Plan Note (Signed)
See social history 

## 2019-01-20 NOTE — Telephone Encounter (Signed)
I left a message on patient's voice mail to return my call.  Patient needs to schedule his annual wellness visit for next year with Dr.Letvak.

## 2019-01-20 NOTE — Assessment & Plan Note (Signed)
On ASA and statin. 

## 2019-01-20 NOTE — Assessment & Plan Note (Signed)
Chronic dysthymia and anxiety Controlled on sertraline

## 2019-01-20 NOTE — Progress Notes (Signed)
Hearing Screening   125Hz  250Hz  500Hz  1000Hz  2000Hz  3000Hz  4000Hz  6000Hz  8000Hz   Right ear:           Left ear:           Comments: Has hearing aids. He is wearing them today.  Vision Screening Comments: August 2020

## 2019-01-20 NOTE — Assessment & Plan Note (Signed)
Continues on Rx per Dr Trudie Reed

## 2019-03-12 NOTE — Progress Notes (Signed)
Virtual Visit via Telephone Note   This visit type was conducted due to national recommendations for restrictions regarding the COVID-19 Pandemic (e.g. social distancing) in an effort to limit this patient's exposure and mitigate transmission in our community.  Due to his co-morbid illnesses, this patient is at least at moderate risk for complications without adequate follow up.  This format is felt to be most appropriate for this patient at this time.  The patient did not have access to video technology/had technical difficulties with video requiring transitioning to audio format only (telephone).  All issues noted in this document were discussed and addressed.  No physical exam could be performed with this format.  Please refer to the patient's chart for his  consent to telehealth for [facility].   Date:  03/13/2019   ID:  Cody Arellano, DOB 1946-03-31, MRN [id number]  Patient Location: Home Provider Location: Office  PCP:  Venia Carbon, MD  Cardiologist:  Mertie Moores, MD  Electrophysiologist:  None   Problem List 1. CAD - mi in 2008 2. Lupus Pericarditis 3. Hypertension 4. Hyperlipidemia   Sept. 8, 2015:  Cody Arellano was seen in Nov. 2014 for follow up of his Lupus pericarditis.  Cody Arellano has had significant worsening of DOE since I last saw him. He has not had to take any nitroglycerin. He does think the symptoms are very similar to when he had his MI.  He denies any PND orthopnea. He denies any pedal edema. He tries to avoid salt but it makes it he eats some salt on occasion.   August 10, 2014 Cody Arellano is a 73 y.o. 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 He also has a history of coronary artery disease, hypertension, and hyperlipidemia.  Has bilateral foot burning - not necessarily with ambulation No CP ,  Watches his diet Walks several miles a day 4-5 times a week.   August 24, 2015:  Has remained active.   No CP or dyspnea . Walks  regularly  No additional symptoms of pericarditis   Oct. 2,2019:  Cody Arellano is seen back today after a 2-1/2-year absence.  He has a history of lupus pericarditis.  He also has a history of coronary artery disease  Her symptoms have a history of an acute myocardial infarction in 2008 at which time he got 2 stents.  Follow-up In 2009 resulted in placement of one additional stent.  He was hospitalized in February in [place] for acute pericarditis as a complication of his lupus.  Seen 2014 with atrial fibrillation.  He was thought to have pericarditis as well at that time.  Recently , he has done well .   No recent pericarditis.  Is on Plaquenil and Imuran  for his Lupus.  Has had Lupus pericardiits twice.  Takes Motrin 600 TIE when he has pericarditis.   Has rare episodes of CP - usually when he wakes up in the am. Typically goes away when he stops and rests.   Feels like an early symptom of when he had the MI .   Tries to walk 3-4 days a week - walks 2 miles  No CP with walking    Has some balance issues.   Is not on any cardiac meds.    Evaluation Performed:  Follow-Up Visit  Chief Complaint:  CAD   History of Present Illness:    Cody Arellano is a 73 y.o. male with CAD.     Oct. 9, 2020  Cody Arellano  is seen today for follow up visit For CAD, HLD, HTN,  Remote atrial fib ( when he had Lupus pericarditis )  Still has episodes of palpitations - he thinks he is still having episodes of atrial fib ,  Last for several minute    The patient does not have symptoms concerning for COVID-19 infection (fever, chills, cough, or new shortness of breath).    Past Medical History:  Diagnosis Date  . Anxiety   . Chronic tension headache   . Colon polyps   . Coronary artery disease 2008/2009   MI with PCI x 2, then PCI x 1 in 2009  . Depression   . Dyslipidemia   . Dysrhythmia    atrial fibrillation  . Hyperlipidemia   . Hypertension   . Lupus [facility])    sees Dr Trudie Reed  .  Lupus disease of the lung   . 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 Myocardial infarction ([place]) 2008  . Shortness of breath    Past Surgical History:  Procedure Laterality Date  . CARDIAC CATHETERIZATION    . coronary stents  2009     Current Meds  Medication Sig  . aspirin EC 81 MG tablet Take 1 tablet (81 mg total) by mouth daily.  [name] azaTHIOprine (IMURAN) 50 MG tablet Take 50 mg by mouth daily.   . hydroxychloroquine (PLAQUENIL) 200 MG tablet Take 200 mg by mouth at bedtime.  [name] ibuprofen (ADVIL,MOTRIN) 600 MG tablet Take 1 tablet (600 mg total) by mouth every 8 (eight) hours as needed.  [name] OVER THE COUNTER MEDICATION Vitamin B 12 500 mcq. One tablet daily.  . rosuvastatin (CRESTOR) 10 MG tablet Take 1 tablet (10 mg total) by mouth daily. Please schedule appt for any future refills. 1st attempt  . sertraline (ZOLOFT) 50 MG tablet Take 50 mg by mouth daily.  . tamsulosin (FLOMAX) 0.4 MG CAPS capsule Take 0.4 mg by mouth daily.      Allergies:   Patient has no known allergies.   Social History   Tobacco Use  . Smoking status: Former Smoker    Packs/day: 1.00    Years: 20.00    Pack years: 20.00    Types: Cigarettes    Quit date: 02/26/1993    Years since quitting: 26.0  . Smokeless tobacco: Never Used  . Tobacco comment: QUIT SMOKING 20 YEARS AGO  Substance Use Topics  . Alcohol use: No  . Drug use: No     Family Hx: The patient's family history includes Alcohol abuse in his father; Cancer in his brother; Diabetes in his paternal uncle; Heart disease in his mother; Hyperlipidemia in his brother and sister; Hypertension in his brother and sister; Stomach cancer in his brother. There is no history of Colon cancer, Esophageal cancer, or Rectal cancer.  ROS:   Please see the history of present illness.     All other systems reviewed and are negative.   Prior CV studies:   The following studies were reviewed today:    Labs/Other Tests and Data Reviewed:    EKG:  No ECG  reviewed.  Recent Labs: 06/12/2018: ALT 7; BUN 8; Creatinine, Ser 0.91; Potassium 4.1; Sodium 142   Recent Lipid Panel Lab Results  Component Value Date/Time   CHOL 110 06/12/2018 02:24 PM   TRIG 55 06/12/2018 02:24 PM   HDL 45 06/12/2018 02:24 PM   CHOLHDL 2.4 06/12/2018 02:24 PM   CHOLHDL 3 01/17/2016 10:02 AM   LDLCALC 54 06/12/2018 02:24 PM  Wt Readings from Last 3 Encounters:  03/13/19 165 lb (74.8 kg)  01/20/19 165 lb (74.8 kg)  07/14/18 170 lb (77.1 kg)     Objective:    Vital Signs:  BP 127/70   Pulse 60   Temp (!) 96.7 F (35.9 C)   Ht 5\' 10"  (1.778 m)   Wt 165 lb (74.8 kg)   BMI 23.68 kg/m      ASSESSMENT & PLAN:    1. CAD - no angina   2.  PAF :   Had documented AF during an episode of pericarditis in 2014.   Still has palpitations which he thinks are atrial fib .   Not documented .    Will give him a 30 day monitor . We will need to start him on anticoagulation if we find Afib in him.     3.  Lupus :  Per primary MD   COVID-19 Education: The signs and symptoms of COVID-19 were discussed with the patient and how to seek care for testing (follow up with PCP or arrange E-visit).  The importance of social distancing was discussed today.  Time:   Today, I have spent  19  minutes with the patient with telehealth technology discussing the above problems.     Medication Adjustments/Labs and Tests Ordered: Current medicines are reviewed at length with the patient today.  Concerns regarding medicines are outlined above.   Tests Ordered: No orders of the defined types were placed in this encounter.   Medication Changes: No orders of the defined types were placed in this encounter.   Follow Up:  Either In Person or Virtual Visit in 6 month(s)  Signed, Mertie Moores, MD  03/13/2019 8:16 AM    [place]

## 2019-03-13 ENCOUNTER — Other Ambulatory Visit: Payer: Self-pay

## 2019-03-13 ENCOUNTER — Telehealth (INDEPENDENT_AMBULATORY_CARE_PROVIDER_SITE_OTHER): Payer: Federal, State, Local not specified - PPO | Admitting: Cardiovascular Disease

## 2019-03-13 ENCOUNTER — Encounter: Payer: Self-pay | Admitting: Cardiovascular Disease

## 2019-03-13 ENCOUNTER — Telehealth: Payer: Self-pay | Admitting: Nurse Practitioner

## 2019-03-13 VITALS — BP 127/70 | HR 60 | Temp 96.7°F | Ht 70.0 in | Wt 165.0 lb

## 2019-03-13 DIAGNOSIS — I2510 Atherosclerotic heart disease of native coronary artery without angina pectoris: Secondary | ICD-10-CM | POA: Diagnosis not present

## 2019-03-13 DIAGNOSIS — I480 Paroxysmal atrial fibrillation: Secondary | ICD-10-CM | POA: Diagnosis not present

## 2019-03-13 NOTE — Telephone Encounter (Signed)
Patient requested virtual visit with Dr. Acie Fredrickson for today  YOUR CARDIOLOGY TEAM HAS ARRANGED FOR AN E-VISIT FOR YOUR APPOINTMENT - PLEASE REVIEW IMPORTANT INFORMATION BELOW SEVERAL DAYS PRIOR TO YOUR APPOINTMENT  Due to the recent COVID-19 pandemic, we are transitioning in-person office visits to tele-medicine visits in an effort to decrease unnecessary exposure to our patients, their families, and staff. These visits are billed to your insurance just like a normal visit is. We also encourage you to sign up for MyChart if you have not already done so. You will need a smartphone if possible. For patients that do not have this, we can still complete the visit using a regular telephone but do prefer a smartphone to enable video when possible. You may have a family member that lives with you that can help. If possible, we also ask that you have a blood pressure cuff and scale at home to measure your blood pressure, heart rate and weight prior to your scheduled appointment. Patients with clinical needs that need an in-person evaluation and testing will still be able to come to the office if absolutely necessary. If you have any questions, feel free to call our office.     YOUR PROVIDER WILL BE USING THE FOLLOWING PLATFORM TO COMPLETE YOUR VISIT: Telephone  . IF USING MYCHART - How to Download the MyChart App to Your SmartPhone   - If Apple, go to [employer] and type in MyChart in the search bar and download the app. If Android, ask patient to go to [employer] and type in [place] in the search bar and download the app. The app is free but as with any other app downloads, your phone may require you to verify saved payment information or Apple/Android password.  - You will need to then log into the app with your MyChart username and password, and select [facility] as your healthcare provider to link the account.  - When it is time for your visit, go to the MyChart app, find appointments, and click  Begin Video Visit. Be sure to Select Allow for your device to access the Microphone and Camera for your visit. You will then be connected, and your provider will be with you shortly.  **If you have any issues connecting or need assistance, please contact MyChart service desk [phone])**  **If using a computer, in order to ensure the best quality for your visit, you will need to use either of the following Internet Browsers: Insurance underwriter or [employer]**  . IF USING DOXIMITY or [email or web address].ME - The staff will give you instructions on receiving your link to join the meeting the day of your visit.      2-3 DAYS BEFORE YOUR APPOINTMENT  You will receive a telephone call from one of our [place] team members - your caller ID may say "Unknown caller." If this is a video visit, we will walk you through how to get the video launched on your phone. We will remind you check your blood pressure, heart rate and weight prior to your scheduled appointment. If you have an Apple Watch or Kardia, please upload any pertinent ECG strips the day before or morning of your appointment to [facility]. Our staff will also make sure you have reviewed the consent and agree to move forward with your scheduled tele-health visit.     THE DAY OF YOUR APPOINTMENT  Approximately 15 minutes prior to your scheduled appointment, you will receive a telephone call from one of [facility]  team - your caller ID may say "Unknown caller."  Our staff will confirm medications, vital signs for the day and any symptoms you may be experiencing. Please have this information available prior to the time of visit start. It may also be helpful for you to have a pad of paper and pen handy for any instructions given during your visit. They will also walk you through joining the smartphone meeting if this is a video visit.    CONSENT FOR TELE-HEALTH VISIT - PLEASE REVIEW  I hereby voluntarily request, consent and authorize [facility] and its employed or contracted physicians, physician assistants, nurse practitioners or other licensed health care professionals (the Practitioner), to provide me with telemedicine health care services (the "Services") as deemed necessary by the treating Practitioner. I acknowledge and consent to receive the Services by the Practitioner via telemedicine. I understand that the telemedicine visit will involve communicating with the Practitioner through live audiovisual communication technology and the disclosure of certain medical information by electronic transmission. I acknowledge that I have been given the opportunity to request an in-person assessment or other available alternative prior to the telemedicine visit and am voluntarily participating in the telemedicine visit.  I understand that I have the right to withhold or withdraw my consent to the use of telemedicine in the course of my care at any time, without affecting my right to future care or treatment, and that the Practitioner or I may terminate the telemedicine visit at any time. I understand that I have the right to inspect all information obtained and/or recorded in the course of the telemedicine visit and may receive copies of available information for a reasonable fee.  I understand that some of the potential risks of receiving the Services via telemedicine include:  [name] Delay or interruption in medical evaluation due to technological equipment failure or disruption; . Information transmitted may not be sufficient (e.g. poor resolution of images) to allow for appropriate medical decision making by the Practitioner; and/or  . In rare instances, security protocols could fail, causing a breach of personal health information.  Furthermore, I acknowledge that it is my responsibility to provide information about my medical history, conditions and care that is complete and accurate to the best of my ability. I acknowledge that Practitioner's  advice, recommendations, and/or decision may be based on factors not within their control, such as incomplete or inaccurate data provided by me or distortions of diagnostic images or specimens that may result from electronic transmissions. I understand that the practice of medicine is not an exact science and that Practitioner makes no warranties or guarantees regarding treatment outcomes. I acknowledge that I will receive a copy of this consent concurrently upon execution via email to the email address I last provided but may also request a printed copy by calling the office of [place].    I understand that my insurance will be billed for this visit.   I have read or had this consent read to me. . I understand the contents of this consent, which adequately explains the benefits and risks of the Services being provided via telemedicine.  . I have been provided ample opportunity to ask questions regarding this consent and the Services and have had my questions answered to my satisfaction. . I give my informed consent for the services to be provided through the use of telemedicine in my medical care  By participating in this telemedicine visit I agree to the above.

## 2019-03-13 NOTE — Patient Instructions (Addendum)
Medication Instructions:  Your physician recommends that you continue on your current medications as directed. Please refer to the Current Medication list given to you today.  If you need a refill on your cardiac medications before your next appointment, please call your pharmacy.    Lab work: None Ordered     Testing/Procedures: Your physician has recommended that you wear an event monitor. Event monitors are medical devices that record the heart's electrical activity. Doctors most often [place] these monitors to diagnose arrhythmias. Arrhythmias are problems with the speed or rhythm of the heartbeat. The monitor is a small, portable device. You can wear one while you do your normal daily activities. This is usually used to diagnose what is causing palpitations/syncope (passing out). **You will receive the monitor as a shipment from the [place] [employer] or from [place]: At [facility], you and your health needs are our priority.  As part of our continuing mission to provide you with exceptional heart care, we have created designated Provider Care Teams.  These Care Teams include your primary Cardiologist (physician) and Advanced Practice Providers (APPs -  Physician Assistants and Nurse Practitioners) who all work together to provide you with the care you need, when you need it. You will need a follow up appointment in:  6 months.  Please call our office 2 months in advance to schedule this appointment.  You may see Mertie Moores, MD or one of the following Advanced Practice Providers on your designated Care Team: Richardson Dopp, PA-C [place], [place] . Daune Perch, NP

## 2019-03-27 ENCOUNTER — Telehealth: Payer: Self-pay

## 2019-03-27 NOTE — Telephone Encounter (Signed)
LM with brief monitor instructions. 30 day Event monitor ordered.

## 2019-04-03 ENCOUNTER — Ambulatory Visit (INDEPENDENT_AMBULATORY_CARE_PROVIDER_SITE_OTHER): Payer: Federal, State, Local not specified - PPO

## 2019-04-03 DIAGNOSIS — I480 Paroxysmal atrial fibrillation: Secondary | ICD-10-CM

## 2019-04-07 ENCOUNTER — Telehealth: Payer: Self-pay | Admitting: Cardiovascular Disease

## 2019-04-07 NOTE — Telephone Encounter (Signed)
Per office note 30 day monitor Pt aware./cy

## 2019-04-07 NOTE — Telephone Encounter (Signed)
Patient is currently wearing a monitor and he would like to know how long he needs to wear it for.

## 2019-04-09 ENCOUNTER — Other Ambulatory Visit: Payer: Self-pay | Admitting: Cardiovascular Disease

## 2019-05-11 ENCOUNTER — Telehealth: Payer: Self-pay | Admitting: Nurse Practitioner

## 2019-05-11 DIAGNOSIS — I472 Ventricular tachycardia, unspecified: Secondary | ICD-10-CM

## 2019-05-11 NOTE — Telephone Encounter (Signed)
-----   Message from Thayer Headings, MD sent at 05/08/2019  3:55 PM EST ----- The monitor shows episodes of sinus rhythm ( predominately sinus bradycardia) with several episodes of nonsustained ventricular tachycardia His last echo was ~ 2016 which showed normal LV function.     Please order an echo so that we have an updated EF. If his EF remain normal ,then these episodes of NSVT are benign.

## 2019-05-11 NOTE — Telephone Encounter (Signed)
Reviewed results with patient. He verbalized understanding and agrees to proceed with getting an echocardiogram. Patient is scheduled for echo on 12/10. He thanked me for the call.

## 2019-05-14 ENCOUNTER — Other Ambulatory Visit: Payer: Self-pay

## 2019-05-14 ENCOUNTER — Ambulatory Visit (HOSPITAL_COMMUNITY): Payer: Federal, State, Local not specified - PPO | Attending: Cardiology

## 2019-05-14 DIAGNOSIS — I472 Ventricular tachycardia, unspecified: Secondary | ICD-10-CM

## 2019-10-17 ENCOUNTER — Encounter (HOSPITAL_COMMUNITY)
Admission: EM | Disposition: A | Discharge status: Home / Self Care | Payer: Self-pay | Source: Home / Self Care | Attending: Thoracic Surgery (Cardiothoracic Vascular Surgery)

## 2019-10-17 ENCOUNTER — Emergency Department (HOSPITAL_COMMUNITY): Payer: Medicare Other | Admitting: Critical Care Medicine

## 2019-10-17 ENCOUNTER — Inpatient Hospital Stay (HOSPITAL_COMMUNITY): Payer: Medicare Other

## 2019-10-17 ENCOUNTER — Encounter (HOSPITAL_COMMUNITY): Payer: Self-pay | Admitting: Emergency Medicine

## 2019-10-17 ENCOUNTER — Emergency Department (HOSPITAL_COMMUNITY): Payer: Medicare Other

## 2019-10-17 ENCOUNTER — Inpatient Hospital Stay (HOSPITAL_COMMUNITY)
Admission: EM | Disposition: A | Discharge status: Home / Self Care | Payer: Self-pay | Source: Home / Self Care | Attending: Thoracic Surgery (Cardiothoracic Vascular Surgery)

## 2019-10-17 ENCOUNTER — Inpatient Hospital Stay (HOSPITAL_COMMUNITY)
Admission: EM | Admit: 2019-10-17 | Discharge: 2019-10-27 | DRG: 231 | Disposition: A | Discharge status: Home / Self Care | Payer: Medicare Other | Attending: Thoracic Surgery (Cardiothoracic Vascular Surgery) | Admitting: Thoracic Surgery (Cardiothoracic Vascular Surgery)

## 2019-10-17 DIAGNOSIS — N401 Enlarged prostate with lower urinary tract symptoms: Secondary | ICD-10-CM | POA: Diagnosis present

## 2019-10-17 DIAGNOSIS — Z79899 Other long term (current) drug therapy: Secondary | ICD-10-CM

## 2019-10-17 DIAGNOSIS — Z20822 Contact with and (suspected) exposure to covid-19: Secondary | ICD-10-CM | POA: Diagnosis not present

## 2019-10-17 DIAGNOSIS — Y831 Surgical operation with implant of artificial internal device as the cause of abnormal reaction of the patient, or of later complication, without mention of misadventure at the time of the procedure: Secondary | ICD-10-CM | POA: Diagnosis present

## 2019-10-17 DIAGNOSIS — I4891 Unspecified atrial fibrillation: Secondary | ICD-10-CM | POA: Diagnosis not present

## 2019-10-17 DIAGNOSIS — I5023 Acute on chronic systolic (congestive) heart failure: Secondary | ICD-10-CM | POA: Diagnosis not present

## 2019-10-17 DIAGNOSIS — I2510 Atherosclerotic heart disease of native coronary artery without angina pectoris: Secondary | ICD-10-CM

## 2019-10-17 DIAGNOSIS — T82855A Stenosis of coronary artery stent, initial encounter: Secondary | ICD-10-CM | POA: Diagnosis present

## 2019-10-17 DIAGNOSIS — J918 Pleural effusion in other conditions classified elsewhere: Secondary | ICD-10-CM | POA: Diagnosis not present

## 2019-10-17 DIAGNOSIS — I252 Old myocardial infarction: Secondary | ICD-10-CM

## 2019-10-17 DIAGNOSIS — Z09 Encounter for follow-up examination after completed treatment for conditions other than malignant neoplasm: Secondary | ICD-10-CM

## 2019-10-17 DIAGNOSIS — I110 Hypertensive heart disease with heart failure: Secondary | ICD-10-CM | POA: Diagnosis present

## 2019-10-17 DIAGNOSIS — M329 Systemic lupus erythematosus, unspecified: Secondary | ICD-10-CM | POA: Diagnosis present

## 2019-10-17 DIAGNOSIS — E785 Hyperlipidemia, unspecified: Secondary | ICD-10-CM | POA: Diagnosis present

## 2019-10-17 DIAGNOSIS — I5041 Acute combined systolic (congestive) and diastolic (congestive) heart failure: Secondary | ICD-10-CM | POA: Diagnosis present

## 2019-10-17 DIAGNOSIS — Z7982 Long term (current) use of aspirin: Secondary | ICD-10-CM | POA: Diagnosis not present

## 2019-10-17 DIAGNOSIS — I213 ST elevation (STEMI) myocardial infarction of unspecified site: Secondary | ICD-10-CM | POA: Diagnosis present

## 2019-10-17 DIAGNOSIS — Z8249 Family history of ischemic heart disease and other diseases of the circulatory system: Secondary | ICD-10-CM

## 2019-10-17 DIAGNOSIS — I480 Paroxysmal atrial fibrillation: Secondary | ICD-10-CM | POA: Diagnosis present

## 2019-10-17 DIAGNOSIS — I2102 ST elevation (STEMI) myocardial infarction involving left anterior descending coronary artery: Secondary | ICD-10-CM

## 2019-10-17 DIAGNOSIS — I2109 ST elevation (STEMI) myocardial infarction involving other coronary artery of anterior wall: Principal | ICD-10-CM | POA: Diagnosis present

## 2019-10-17 DIAGNOSIS — I509 Heart failure, unspecified: Secondary | ICD-10-CM

## 2019-10-17 DIAGNOSIS — I4892 Unspecified atrial flutter: Secondary | ICD-10-CM | POA: Diagnosis not present

## 2019-10-17 DIAGNOSIS — R570 Cardiogenic shock: Secondary | ICD-10-CM | POA: Diagnosis not present

## 2019-10-17 DIAGNOSIS — N170 Acute kidney failure with tubular necrosis: Secondary | ICD-10-CM | POA: Diagnosis not present

## 2019-10-17 DIAGNOSIS — I5031 Acute diastolic (congestive) heart failure: Secondary | ICD-10-CM | POA: Diagnosis not present

## 2019-10-17 DIAGNOSIS — Z87891 Personal history of nicotine dependence: Secondary | ICD-10-CM

## 2019-10-17 DIAGNOSIS — Z9889 Other specified postprocedural states: Secondary | ICD-10-CM

## 2019-10-17 DIAGNOSIS — J9811 Atelectasis: Secondary | ICD-10-CM

## 2019-10-17 DIAGNOSIS — J90 Pleural effusion, not elsewhere classified: Secondary | ICD-10-CM

## 2019-10-17 DIAGNOSIS — J849 Interstitial pulmonary disease, unspecified: Secondary | ICD-10-CM | POA: Diagnosis present

## 2019-10-17 DIAGNOSIS — Z951 Presence of aortocoronary bypass graft: Secondary | ICD-10-CM

## 2019-10-17 DIAGNOSIS — D62 Acute posthemorrhagic anemia: Secondary | ICD-10-CM | POA: Diagnosis not present

## 2019-10-17 DIAGNOSIS — D6959 Other secondary thrombocytopenia: Secondary | ICD-10-CM | POA: Diagnosis not present

## 2019-10-17 DIAGNOSIS — I5043 Acute on chronic combined systolic (congestive) and diastolic (congestive) heart failure: Secondary | ICD-10-CM | POA: Diagnosis not present

## 2019-10-17 DIAGNOSIS — I25110 Atherosclerotic heart disease of native coronary artery with unstable angina pectoris: Secondary | ICD-10-CM

## 2019-10-17 DIAGNOSIS — M3213 Lung involvement in systemic lupus erythematosus: Secondary | ICD-10-CM | POA: Diagnosis not present

## 2019-10-17 DIAGNOSIS — R0902 Hypoxemia: Secondary | ICD-10-CM

## 2019-10-17 HISTORY — PX: CORONARY BALLOON ANGIOPLASTY: CATH118233

## 2019-10-17 HISTORY — PX: IABP INSERTION: CATH118242

## 2019-10-17 HISTORY — PX: TEE WITHOUT CARDIOVERSION: SHX5443

## 2019-10-17 HISTORY — PX: CLIPPING OF ATRIAL APPENDAGE: SHX5773

## 2019-10-17 HISTORY — DX: Presence of aortocoronary bypass graft: Z95.1

## 2019-10-17 HISTORY — PX: RIGHT/LEFT HEART CATH AND CORONARY ANGIOGRAPHY: CATH118266

## 2019-10-17 HISTORY — PX: ENDOVEIN HARVEST OF GREATER SAPHENOUS VEIN: SHX5059

## 2019-10-17 HISTORY — PX: CORONARY/GRAFT ACUTE MI REVASCULARIZATION: CATH118305

## 2019-10-17 HISTORY — PX: CORONARY ARTERY BYPASS GRAFT: SHX141

## 2019-10-17 LAB — COMPREHENSIVE METABOLIC PANEL
ALT: 12 U/L (ref 0–44)
AST: 29 U/L (ref 15–41)
Albumin: 3.2 g/dL — ABNORMAL LOW (ref 3.5–5.0)
Alkaline Phosphatase: 71 U/L (ref 38–126)
Anion gap: 10 (ref 5–15)
BUN: 10 mg/dL (ref 8–23)
CO2: 20 mmol/L — ABNORMAL LOW (ref 22–32)
Calcium: 8.6 mg/dL — ABNORMAL LOW (ref 8.9–10.3)
Chloride: 112 mmol/L — ABNORMAL HIGH (ref 98–111)
Creatinine, Ser: 1.19 mg/dL (ref 0.61–1.24)
GFR calc Af Amer: >60 mL/min (ref >60)
GFR calc non Af Amer: 60 mL/min — ABNORMAL LOW (ref >60)
Glucose, Bld: 91 mg/dL (ref 70–99)
Potassium: 4.7 mmol/L (ref 3.5–5.1)
Sodium: 142 mmol/L (ref 135–145)
Total Bilirubin: 1.2 mg/dL (ref 0.3–1.2)
Total Protein: 6.5 g/dL (ref 6.5–8.1)

## 2019-10-17 LAB — BASIC METABOLIC PANEL
Anion gap: 7 (ref 5–15)
BUN: 9 mg/dL (ref 8–23)
CO2: 18 mmol/L — ABNORMAL LOW (ref 22–32)
Calcium: 7.1 mg/dL — ABNORMAL LOW (ref 8.9–10.3)
Chloride: 113 mmol/L — ABNORMAL HIGH (ref 98–111)
Creatinine, Ser: 0.82 mg/dL (ref 0.61–1.24)
GFR calc Af Amer: >60 mL/min (ref >60)
GFR calc non Af Amer: >60 mL/min (ref >60)
Glucose, Bld: 133 mg/dL — ABNORMAL HIGH (ref 70–99)
Potassium: 4.1 mmol/L (ref 3.5–5.1)
Sodium: 138 mmol/L (ref 135–145)

## 2019-10-17 LAB — LIPID PANEL
Cholesterol: 113 mg/dL (ref 0–200)
HDL: 45 mg/dL (ref >40)
LDL Cholesterol: 58 mg/dL (ref 0–99)
Total CHOL/HDL Ratio: 2.5 RATIO
Triglycerides: 48 mg/dL (ref <150)
VLDL: 10 mg/dL (ref 0–40)

## 2019-10-17 LAB — POCT I-STAT 7, (LYTES, BLD GAS, ICA,H+H)
Acid-base deficit: 3 mmol/L — ABNORMAL HIGH (ref 0.0–2.0)
Bicarbonate: 22 mmol/L (ref 20.0–28.0)
Calcium, Ion: 1.01 mmol/L — ABNORMAL LOW (ref 1.15–1.40)
HCT: 27 % — ABNORMAL LOW (ref 39.0–52.0)
Hemoglobin: 9.2 g/dL — ABNORMAL LOW (ref 13.0–17.0)
O2 Saturation: 96 %
Patient temperature: 34.5
Potassium: 4.3 mmol/L (ref 3.5–5.1)
Sodium: 143 mmol/L (ref 135–145)
TCO2: 23 mmol/L (ref 22–32)
pCO2 arterial: 34.3 mmHg (ref 32.0–48.0)
pH, Arterial: 7.404 (ref 7.350–7.450)
pO2, Arterial: 69 mmHg — ABNORMAL LOW (ref 83.0–108.0)

## 2019-10-17 LAB — COOXEMETRY PANEL
Carboxyhemoglobin: 1.4 % (ref 0.5–1.5)
Methemoglobin: 1.1 % (ref 0.0–1.5)
O2 Saturation: 70 %
Total hemoglobin: 10.3 g/dL — ABNORMAL LOW (ref 12.0–16.0)

## 2019-10-17 LAB — APTT
aPTT: 26 seconds (ref 24–36)
aPTT: 34 seconds (ref 24–36)

## 2019-10-17 LAB — HEMOGLOBIN AND HEMATOCRIT, BLOOD
HCT: 30.2 % — ABNORMAL LOW (ref 39.0–52.0)
Hemoglobin: 9.8 g/dL — ABNORMAL LOW (ref 13.0–17.0)

## 2019-10-17 LAB — CBC
HCT: 43.2 % (ref 39.0–52.0)
HCT: 29.9 % — ABNORMAL LOW (ref 39.0–52.0)
Hemoglobin: 14.2 g/dL (ref 13.0–17.0)
Hemoglobin: 9.5 g/dL — ABNORMAL LOW (ref 13.0–17.0)
MCH: 31.8 pg (ref 26.0–34.0)
MCH: 31.8 pg (ref 26.0–34.0)
MCHC: 32.9 g/dL (ref 30.0–36.0)
MCHC: 31.8 g/dL (ref 30.0–36.0)
MCV: 96.9 fL (ref 80.0–100.0)
MCV: 100 fL (ref 80.0–100.0)
Platelets: 198 10*3/uL (ref 150–400)
Platelets: 92 10*3/uL — ABNORMAL LOW (ref 150–400)
RBC: 4.46 MIL/uL (ref 4.22–5.81)
RBC: 2.99 MIL/uL — ABNORMAL LOW (ref 4.22–5.81)
RDW: 12.8 % (ref 11.5–15.5)
RDW: 13 % (ref 11.5–15.5)
WBC: 6.5 10*3/uL (ref 4.0–10.5)
WBC: 8.7 10*3/uL (ref 4.0–10.5)
nRBC: 0 % (ref 0.0–0.2)
nRBC: 0 % (ref 0.0–0.2)

## 2019-10-17 LAB — GLUCOSE, CAPILLARY
Glucose-Capillary: 116 mg/dL — ABNORMAL HIGH (ref 70–99)
Glucose-Capillary: 120 mg/dL — ABNORMAL HIGH (ref 70–99)
Glucose-Capillary: 123 mg/dL — ABNORMAL HIGH (ref 70–99)
Glucose-Capillary: 123 mg/dL — ABNORMAL HIGH (ref 70–99)
Glucose-Capillary: 130 mg/dL — ABNORMAL HIGH (ref 70–99)

## 2019-10-17 LAB — HEMOGLOBIN A1C
Hgb A1c MFr Bld: 5.7 % — ABNORMAL HIGH (ref 4.8–5.6)
Mean Plasma Glucose: 116.89 mg/dL

## 2019-10-17 LAB — TROPONIN I (HIGH SENSITIVITY): Troponin I (High Sensitivity): 28 ng/L — ABNORMAL HIGH (ref <18)

## 2019-10-17 LAB — PREPARE RBC (CROSSMATCH)

## 2019-10-17 LAB — ECHO INTRAOPERATIVE TEE
Height: 70 in
Weight: 2720 oz

## 2019-10-17 LAB — SARS CORONAVIRUS 2 BY RT PCR (HOSPITAL ORDER, PERFORMED IN ~~LOC~~ HOSPITAL LAB): SARS Coronavirus 2: NEGATIVE

## 2019-10-17 LAB — PROTIME-INR
INR: 1.1 (ref 0.8–1.2)
INR: 1.6 — ABNORMAL HIGH (ref 0.8–1.2)
Prothrombin Time: 13.4 seconds (ref 11.4–15.2)
Prothrombin Time: 18.3 seconds — ABNORMAL HIGH (ref 11.4–15.2)

## 2019-10-17 LAB — ABO/RH: ABO/RH(D): A POS

## 2019-10-17 LAB — PLATELET COUNT: Platelets: 125 10*3/uL — ABNORMAL LOW (ref 150–400)

## 2019-10-17 SURGERY — CORONARY ARTERY BYPASS GRAFTING (CABG)
Anesthesia: General | Site: Leg Upper | Laterality: Right

## 2019-10-17 SURGERY — CORONARY/GRAFT ACUTE MI REVASCULARIZATION
Anesthesia: LOCAL

## 2019-10-17 MED ORDER — ROCURONIUM BROMIDE 10 MG/ML (PF) SYRINGE
PREFILLED_SYRINGE | INTRAVENOUS | Status: DC | PRN
  Administered 2019-10-17 (×2): 50 mg via INTRAVENOUS
  Administered 2019-10-17: 100 mg via INTRAVENOUS

## 2019-10-17 MED ORDER — SODIUM CHLORIDE 0.45 % IV SOLN: INTRAVENOUS | Status: DC | PRN

## 2019-10-17 MED ORDER — CANGRELOR TETRASODIUM 50 MG IV SOLR
INTRAVENOUS | Status: AC
  Filled 2019-10-17: qty 50

## 2019-10-17 MED ORDER — PHENYLEPHRINE HCL-NACL 20-0.9 MG/250ML-% IV SOLN
0.0000 ug/min | INTRAVENOUS | Status: DC
  Filled 2019-10-17: qty 250

## 2019-10-17 MED ORDER — TAMSULOSIN HCL 0.4 MG PO CAPS: 0.4000 mg | ORAL_CAPSULE | Freq: Every day | ORAL | Status: DC

## 2019-10-17 MED ORDER — ALBUMIN HUMAN 5 % IV SOLN: INTRAVENOUS | Status: DC | PRN

## 2019-10-17 MED ORDER — AMIODARONE HCL IN DEXTROSE 360-4.14 MG/200ML-% IV SOLN
60.0000 mg/h | INTRAVENOUS | Status: DC
  Filled 2019-10-17: qty 200

## 2019-10-17 MED ORDER — ASPIRIN EC 81 MG PO TBEC: 81.0000 mg | DELAYED_RELEASE_TABLET | Freq: Every day | ORAL | Status: DC

## 2019-10-17 MED ORDER — CHLORHEXIDINE GLUCONATE CLOTH 2 % EX PADS
6.0000 | MEDICATED_PAD | Freq: Every day | CUTANEOUS | Status: DC
  Administered 2019-10-18 – 2019-10-26 (×8): 6 via TOPICAL

## 2019-10-17 MED ORDER — NITROGLYCERIN IN D5W 200-5 MCG/ML-% IV SOLN
2.0000 ug/min | INTRAVENOUS | Status: AC
  Administered 2019-10-17: 3 ug/min via INTRAVENOUS
  Filled 2019-10-17: qty 250

## 2019-10-17 MED ORDER — SODIUM CHLORIDE 0.9 % IV SOLN: 250.0000 mL | INTRAVENOUS | Status: DC | PRN

## 2019-10-17 MED ORDER — DEXMEDETOMIDINE HCL IN NACL 400 MCG/100ML IV SOLN
0.1000 ug/kg/h | INTRAVENOUS | Status: AC
  Administered 2019-10-17: .3 ug/kg/h via INTRAVENOUS
  Filled 2019-10-17: qty 100

## 2019-10-17 MED ORDER — SUCCINYLCHOLINE CHLORIDE 200 MG/10ML IV SOSY
PREFILLED_SYRINGE | INTRAVENOUS | Status: DC | PRN
  Administered 2019-10-17: 140 mg via INTRAVENOUS

## 2019-10-17 MED ORDER — DEXTROSE 50 % IV SOLN: 0.0000 mL | INTRAVENOUS | Status: DC | PRN

## 2019-10-17 MED ORDER — METOPROLOL TARTRATE 5 MG/5ML IV SOLN: 2.5000 mg | INTRAVENOUS | Status: DC | PRN

## 2019-10-17 MED ORDER — EPHEDRINE 5 MG/ML INJ
INTRAVENOUS | Status: AC
  Filled 2019-10-17: qty 10

## 2019-10-17 MED ORDER — SODIUM CHLORIDE 0.9% FLUSH
10.0000 mL | Freq: Two times a day (BID) | INTRAVENOUS | Status: DC
  Administered 2019-10-18 – 2019-10-22 (×8): 10 mL
  Administered 2019-10-23: 20 mL
  Administered 2019-10-23 – 2019-10-26 (×7): 10 mL

## 2019-10-17 MED ORDER — FENTANYL CITRATE (PF) 250 MCG/5ML IJ SOLN
INTRAMUSCULAR | Status: AC
  Filled 2019-10-17: qty 25

## 2019-10-17 MED ORDER — ASPIRIN 81 MG PO CHEW: 324.0000 mg | CHEWABLE_TABLET | Freq: Every day | ORAL | Status: DC

## 2019-10-17 MED ORDER — SUCCINYLCHOLINE CHLORIDE 200 MG/10ML IV SOSY
PREFILLED_SYRINGE | INTRAVENOUS | Status: AC
  Filled 2019-10-17: qty 20

## 2019-10-17 MED ORDER — PROPOFOL 10 MG/ML IV BOLUS
INTRAVENOUS | Status: DC | PRN
  Administered 2019-10-17: 20 mg via INTRAVENOUS

## 2019-10-17 MED ORDER — DOPAMINE-DEXTROSE 3.2-5 MG/ML-% IV SOLN: 0.0000 ug/kg/min | INTRAVENOUS | Status: DC

## 2019-10-17 MED ORDER — METOPROLOL TARTRATE 25 MG/10 ML ORAL SUSPENSION: 12.5000 mg | Freq: Two times a day (BID) | ORAL | Status: DC

## 2019-10-17 MED ORDER — NOREPINEPHRINE 4 MG/250ML-% IV SOLN
4.0000 ug/min | INTRAVENOUS | Status: DC
  Administered 2019-10-18: 4 ug/min via INTRAVENOUS
  Filled 2019-10-17: qty 250

## 2019-10-17 MED ORDER — PROPOFOL 10 MG/ML IV BOLUS
INTRAVENOUS | Status: AC
  Filled 2019-10-17: qty 20

## 2019-10-17 MED ORDER — MORPHINE SULFATE (PF) 2 MG/ML IV SOLN
1.0000 mg | INTRAVENOUS | Status: DC | PRN
  Administered 2019-10-18 – 2019-10-19 (×2): 2 mg via INTRAVENOUS
  Filled 2019-10-17 (×2): qty 1

## 2019-10-17 MED ORDER — HEPARIN (PORCINE) IN NACL 1000-0.9 UT/500ML-% IV SOLN
INTRAVENOUS | Status: DC | PRN
  Administered 2019-10-17 (×3): 500 mL

## 2019-10-17 MED ORDER — VERAPAMIL HCL 2.5 MG/ML IV SOLN
INTRAVENOUS | Status: DC | PRN
  Administered 2019-10-17: 10 mL via INTRA_ARTERIAL

## 2019-10-17 MED ORDER — SODIUM CHLORIDE 0.9 % IV SOLN
INTRAVENOUS | Status: DC
  Administered 2019-10-17: 500 mL via INTRAVENOUS

## 2019-10-17 MED ORDER — SODIUM CHLORIDE 0.9 % IV SOLN
INTRAVENOUS | Status: AC | PRN
  Administered 2019-10-17: 10 mL/h via INTRAVENOUS

## 2019-10-17 MED ORDER — MIDAZOLAM HCL 5 MG/5ML IJ SOLN
INTRAMUSCULAR | Status: DC | PRN
  Administered 2019-10-17: 2 mg via INTRAVENOUS
  Administered 2019-10-17: 4 mg via INTRAVENOUS
  Administered 2019-10-17 (×2): 1 mg via INTRAVENOUS
  Administered 2019-10-17: 2 mg via INTRAVENOUS

## 2019-10-17 MED ORDER — 0.9 % SODIUM CHLORIDE (POUR BTL) OPTIME
TOPICAL | Status: DC | PRN
  Administered 2019-10-17: 5000 mL

## 2019-10-17 MED ORDER — FAMOTIDINE IN NACL 20-0.9 MG/50ML-% IV SOLN
20.0000 mg | Freq: Two times a day (BID) | INTRAVENOUS | Status: DC
  Administered 2019-10-17: 20 mg via INTRAVENOUS
  Filled 2019-10-17: qty 50

## 2019-10-17 MED ORDER — SODIUM CHLORIDE 0.9% FLUSH: 10.0000 mL | INTRAVENOUS | Status: DC | PRN

## 2019-10-17 MED ORDER — NOREPINEPHRINE 4 MG/250ML-% IV SOLN
INTRAVENOUS | Status: AC
  Filled 2019-10-17: qty 250

## 2019-10-17 MED ORDER — LACTATED RINGERS IV SOLN
INTRAVENOUS | Status: DC
  Administered 2019-10-17: 800 mL via INTRAVENOUS

## 2019-10-17 MED ORDER — SODIUM CHLORIDE 0.9 % IV SOLN
INTRAVENOUS | Status: DC
  Filled 2019-10-17: qty 30

## 2019-10-17 MED ORDER — OXYCODONE HCL 5 MG PO TABS
5.0000 mg | ORAL_TABLET | ORAL | Status: DC | PRN
  Administered 2019-10-18: 5 mg via ORAL
  Administered 2019-10-18: 10 mg via ORAL
  Filled 2019-10-17 (×2): qty 2
  Filled 2019-10-17: qty 1

## 2019-10-17 MED ORDER — LACTATED RINGERS IV SOLN
INTRAVENOUS | Status: DC | PRN
Start: 2019-10-17 — End: 2019-10-17

## 2019-10-17 MED ORDER — EPINEPHRINE HCL 5 MG/250ML IV SOLN IN NS
0.0000 ug/min | INTRAVENOUS | Status: DC
  Filled 2019-10-17: qty 250

## 2019-10-17 MED ORDER — SODIUM CHLORIDE 0.9% FLUSH
3.0000 mL | Freq: Two times a day (BID) | INTRAVENOUS | Status: DC
  Administered 2019-10-18 – 2019-10-22 (×7): 3 mL via INTRAVENOUS

## 2019-10-17 MED ORDER — BISACODYL 10 MG RE SUPP: 10.0000 mg | Freq: Every day | RECTAL | Status: DC

## 2019-10-17 MED ORDER — LACTATED RINGERS IV SOLN
INTRAVENOUS | Status: DC
  Administered 2019-10-17: 850 mL via INTRAVENOUS

## 2019-10-17 MED ORDER — SODIUM CHLORIDE 0.9% FLUSH: 3.0000 mL | INTRAVENOUS | Status: DC | PRN

## 2019-10-17 MED ORDER — LACTATED RINGERS IV SOLN: 500.0000 mL | Freq: Once | INTRAVENOUS | Status: DC | PRN

## 2019-10-17 MED ORDER — INSULIN REGULAR(HUMAN) IN NACL 100-0.9 UT/100ML-% IV SOLN
INTRAVENOUS | Status: AC
  Administered 2019-10-17: 1 [IU]/h via INTRAVENOUS
  Filled 2019-10-17: qty 100

## 2019-10-17 MED ORDER — LIDOCAINE HCL (PF) 1 % IJ SOLN
INTRAMUSCULAR | Status: DC | PRN
  Administered 2019-10-17: 15 mL via SUBCUTANEOUS
  Administered 2019-10-17: 5 mL via SUBCUTANEOUS

## 2019-10-17 MED ORDER — CHLORHEXIDINE GLUCONATE 0.12 % MT SOLN
15.0000 mL | OROMUCOSAL | Status: AC
  Administered 2019-10-17: 15 mL via OROMUCOSAL

## 2019-10-17 MED ORDER — NOREPINEPHRINE BITARTRATE 1 MG/ML IV SOLN
INTRAVENOUS | Status: AC | PRN
  Administered 2019-10-17: 5 ug/min via INTRAVENOUS

## 2019-10-17 MED ORDER — SODIUM CHLORIDE 0.9 % IV SOLN
INTRAVENOUS | Status: AC | PRN
  Administered 2019-10-17: 50 mL/h via INTRAVENOUS

## 2019-10-17 MED ORDER — VANCOMYCIN HCL 1000 MG IV SOLR
INTRAVENOUS | Status: AC
  Administered 2019-10-17: 1000 mL
  Filled 2019-10-17: qty 1000

## 2019-10-17 MED ORDER — HEMOSTATIC AGENTS (NO CHARGE) OPTIME
TOPICAL | Status: DC | PRN
  Administered 2019-10-17 (×3): 1 via TOPICAL

## 2019-10-17 MED ORDER — CHLORHEXIDINE GLUCONATE CLOTH 2 % EX PADS
6.0000 | MEDICATED_PAD | Freq: Every day | CUTANEOUS | Status: DC
  Administered 2019-10-22: 6 via TOPICAL

## 2019-10-17 MED ORDER — INSULIN REGULAR(HUMAN) IN NACL 100-0.9 UT/100ML-% IV SOLN: INTRAVENOUS | Status: DC

## 2019-10-17 MED ORDER — ACETAMINOPHEN 325 MG PO TABS: 650.0000 mg | ORAL_TABLET | ORAL | Status: DC | PRN

## 2019-10-17 MED ORDER — NITROGLYCERIN 1 MG/10 ML FOR IR/CATH LAB
INTRA_ARTERIAL | Status: AC
  Filled 2019-10-17: qty 10

## 2019-10-17 MED ORDER — ALBUMIN HUMAN 5 % IV SOLN
250.0000 mL | INTRAVENOUS | Status: AC | PRN
  Administered 2019-10-17 – 2019-10-18 (×2): 12.5 g via INTRAVENOUS
  Filled 2019-10-17: qty 250

## 2019-10-17 MED ORDER — MIDAZOLAM HCL (PF) 10 MG/2ML IJ SOLN
INTRAMUSCULAR | Status: AC
  Filled 2019-10-17: qty 2

## 2019-10-17 MED ORDER — HEPARIN SODIUM (PORCINE) 1000 UNIT/ML IJ SOLN
INTRAMUSCULAR | Status: AC
  Filled 2019-10-17: qty 1

## 2019-10-17 MED ORDER — HEPARIN (PORCINE) IN NACL 1000-0.9 UT/500ML-% IV SOLN
INTRAVENOUS | Status: AC
  Filled 2019-10-17: qty 1000

## 2019-10-17 MED ORDER — PROTAMINE SULFATE 10 MG/ML IV SOLN
INTRAVENOUS | Status: DC | PRN
  Administered 2019-10-17: 270 mg via INTRAVENOUS

## 2019-10-17 MED ORDER — TRANEXAMIC ACID 1000 MG/10ML IV SOLN
1.5000 mg/kg/h | INTRAVENOUS | Status: AC
  Administered 2019-10-17: 1.5 mg/kg/h via INTRAVENOUS
  Filled 2019-10-17: qty 25

## 2019-10-17 MED ORDER — ROSUVASTATIN CALCIUM 20 MG PO TABS: 40.0000 mg | ORAL_TABLET | Freq: Every day | ORAL | Status: DC

## 2019-10-17 MED ORDER — PHENYLEPHRINE 40 MCG/ML (10ML) SYRINGE FOR IV PUSH (FOR BLOOD PRESSURE SUPPORT)
PREFILLED_SYRINGE | INTRAVENOUS | Status: AC
  Filled 2019-10-17: qty 10

## 2019-10-17 MED ORDER — METOPROLOL TARTRATE 12.5 MG HALF TABLET
12.5000 mg | ORAL_TABLET | Freq: Two times a day (BID) | ORAL | Status: DC
  Administered 2019-10-19 – 2019-10-20 (×3): 12.5 mg via ORAL
  Filled 2019-10-17 (×4): qty 1

## 2019-10-17 MED ORDER — ACETAMINOPHEN 650 MG RE SUPP
650.0000 mg | Freq: Once | RECTAL | Status: AC
  Administered 2019-10-17: 650 mg via RECTAL

## 2019-10-17 MED ORDER — HEPARIN SODIUM (PORCINE) 1000 UNIT/ML IJ SOLN
INTRAMUSCULAR | Status: AC
  Filled 2019-10-17: qty 2

## 2019-10-17 MED ORDER — HYDRALAZINE HCL 20 MG/ML IJ SOLN: 10.0000 mg | INTRAMUSCULAR | Status: DC | PRN

## 2019-10-17 MED ORDER — ETOMIDATE 2 MG/ML IV SOLN
INTRAVENOUS | Status: AC
  Filled 2019-10-17: qty 10

## 2019-10-17 MED ORDER — SODIUM CHLORIDE 0.9 % IV SOLN
1.5000 g | INTRAVENOUS | Status: AC
  Administered 2019-10-17: 1.5 g via INTRAVENOUS
  Filled 2019-10-17: qty 1.5

## 2019-10-17 MED ORDER — ROCURONIUM BROMIDE 10 MG/ML (PF) SYRINGE
PREFILLED_SYRINGE | INTRAVENOUS | Status: AC
  Filled 2019-10-17: qty 30

## 2019-10-17 MED ORDER — SODIUM CHLORIDE 0.9 % IV SOLN: INTRAVENOUS | Status: DC | PRN

## 2019-10-17 MED ORDER — FENTANYL CITRATE (PF) 250 MCG/5ML IJ SOLN
INTRAMUSCULAR | Status: AC
  Filled 2019-10-17: qty 5

## 2019-10-17 MED ORDER — ONDANSETRON HCL 4 MG/2ML IJ SOLN: 4.0000 mg | Freq: Four times a day (QID) | INTRAMUSCULAR | Status: DC | PRN

## 2019-10-17 MED ORDER — LIDOCAINE HCL (PF) 1 % IJ SOLN
INTRAMUSCULAR | Status: AC
  Filled 2019-10-17: qty 30

## 2019-10-17 MED ORDER — AMIODARONE LOAD VIA INFUSION
150.0000 mg | Freq: Once | INTRAVENOUS | Status: AC
  Administered 2019-10-17: 150 mg via INTRAVENOUS
  Filled 2019-10-17: qty 83.34

## 2019-10-17 MED ORDER — MILRINONE LACTATE IN DEXTROSE 20-5 MG/100ML-% IV SOLN
0.3000 ug/kg/min | INTRAVENOUS | Status: DC
  Administered 2019-10-18: 0.3 ug/kg/min via INTRAVENOUS
  Filled 2019-10-17: qty 100

## 2019-10-17 MED ORDER — DEXMEDETOMIDINE HCL IN NACL 400 MCG/100ML IV SOLN
0.0000 ug/kg/h | INTRAVENOUS | Status: DC
  Administered 2019-10-17: 0.3 ug/kg/h via INTRAVENOUS
  Administered 2019-10-17: 0.7 ug/kg/h via INTRAVENOUS
  Filled 2019-10-17: qty 100

## 2019-10-17 MED ORDER — MIDAZOLAM HCL 2 MG/2ML IJ SOLN
INTRAMUSCULAR | Status: AC
  Filled 2019-10-17: qty 2

## 2019-10-17 MED ORDER — ASPIRIN EC 325 MG PO TBEC
325.0000 mg | DELAYED_RELEASE_TABLET | Freq: Every day | ORAL | Status: DC
  Administered 2019-10-18: 325 mg via ORAL
  Filled 2019-10-17: qty 1

## 2019-10-17 MED ORDER — TRAMADOL HCL 50 MG PO TABS: 50.0000 mg | ORAL_TABLET | ORAL | Status: DC | PRN

## 2019-10-17 MED ORDER — MILRINONE LACTATE IN DEXTROSE 20-5 MG/100ML-% IV SOLN
0.3000 ug/kg/min | INTRAVENOUS | Status: AC
  Administered 2019-10-17: .3 ug/kg/min via INTRAVENOUS
  Filled 2019-10-17: qty 100

## 2019-10-17 MED ORDER — VANCOMYCIN HCL 1250 MG/250ML IV SOLN
1250.0000 mg | INTRAVENOUS | Status: AC
  Administered 2019-10-17: 1250 mg via INTRAVENOUS
  Filled 2019-10-17: qty 250

## 2019-10-17 MED ORDER — SERTRALINE HCL 50 MG PO TABS: 50.0000 mg | ORAL_TABLET | Freq: Every day | ORAL | Status: DC

## 2019-10-17 MED ORDER — IOHEXOL 350 MG/ML SOLN
INTRAVENOUS | Status: AC
  Filled 2019-10-17: qty 1

## 2019-10-17 MED ORDER — MIDAZOLAM HCL 2 MG/2ML IJ SOLN
INTRAMUSCULAR | Status: DC | PRN
  Administered 2019-10-17: 0.5 mg via INTRAVENOUS

## 2019-10-17 MED ORDER — NITROGLYCERIN IN D5W 200-5 MCG/ML-% IV SOLN: 0.0000 ug/min | INTRAVENOUS | Status: DC

## 2019-10-17 MED ORDER — ACETAMINOPHEN 160 MG/5ML PO SOLN
1000.0000 mg | Freq: Four times a day (QID) | ORAL | Status: DC
  Administered 2019-10-17: 1000 mg
  Filled 2019-10-17: qty 40.6

## 2019-10-17 MED ORDER — PROTAMINE SULFATE 10 MG/ML IV SOLN
INTRAVENOUS | Status: AC
  Filled 2019-10-17: qty 5

## 2019-10-17 MED ORDER — SODIUM CHLORIDE (PF) 0.9 % IJ SOLN
INTRAMUSCULAR | Status: AC
  Filled 2019-10-17: qty 10

## 2019-10-17 MED ORDER — HEPARIN SODIUM (PORCINE) 1000 UNIT/ML IJ SOLN
INTRAMUSCULAR | Status: DC | PRN
  Administered 2019-10-17: 27000 [IU] via INTRAVENOUS

## 2019-10-17 MED ORDER — PLASMA-LYTE 148 IV SOLN
INTRAVENOUS | Status: AC
  Administered 2019-10-17: 500 mL
  Filled 2019-10-17: qty 2.5

## 2019-10-17 MED ORDER — SODIUM CHLORIDE 0.9 % IV SOLN
1.5000 g | Freq: Two times a day (BID) | INTRAVENOUS | Status: AC
  Administered 2019-10-18 – 2019-10-19 (×4): 1.5 g via INTRAVENOUS
  Filled 2019-10-17 (×4): qty 1.5

## 2019-10-17 MED ORDER — PHENYLEPHRINE HCL-NACL 20-0.9 MG/250ML-% IV SOLN
30.0000 ug/min | INTRAVENOUS | Status: AC
  Administered 2019-10-17: 25 ug/min via INTRAVENOUS
  Filled 2019-10-17: qty 250

## 2019-10-17 MED ORDER — SODIUM CHLORIDE 0.9 % IV SOLN
750.0000 mg | INTRAVENOUS | Status: DC
  Filled 2019-10-17: qty 750

## 2019-10-17 MED ORDER — ARTIFICIAL TEARS OPHTHALMIC OINT
TOPICAL_OINTMENT | OPHTHALMIC | Status: DC | PRN
  Administered 2019-10-17: 1 via OPHTHALMIC

## 2019-10-17 MED ORDER — CANGRELOR BOLUS VIA INFUSION
INTRAVENOUS | Status: DC | PRN
  Administered 2019-10-17: 2313 ug via INTRAVENOUS

## 2019-10-17 MED ORDER — BISACODYL 5 MG PO TBEC
10.0000 mg | DELAYED_RELEASE_TABLET | Freq: Every day | ORAL | Status: DC
  Administered 2019-10-18 – 2019-10-27 (×6): 10 mg via ORAL
  Filled 2019-10-17 (×7): qty 2

## 2019-10-17 MED ORDER — PANTOPRAZOLE SODIUM 40 MG PO TBEC
40.0000 mg | DELAYED_RELEASE_TABLET | Freq: Every day | ORAL | Status: DC
  Administered 2019-10-19 – 2019-10-27 (×9): 40 mg via ORAL
  Filled 2019-10-17 (×9): qty 1

## 2019-10-17 MED ORDER — SODIUM CHLORIDE 0.9 % IV SOLN
INTRAVENOUS | Status: AC | PRN
  Administered 2019-10-17 (×2): 4 ug/kg/min via INTRAVENOUS

## 2019-10-17 MED ORDER — MAGNESIUM SULFATE 50 % IJ SOLN
40.0000 meq | INTRAMUSCULAR | Status: DC
  Filled 2019-10-17: qty 9.85

## 2019-10-17 MED ORDER — NOREPINEPHRINE 4 MG/250ML-% IV SOLN
0.0000 ug/min | INTRAVENOUS | Status: AC
  Administered 2019-10-17: 5 ug/min via INTRAVENOUS
  Administered 2019-10-17: 3 ug/min via INTRAVENOUS
  Filled 2019-10-17: qty 250

## 2019-10-17 MED ORDER — ACETAMINOPHEN 160 MG/5ML PO SOLN: 650.0000 mg | Freq: Once | ORAL | Status: AC

## 2019-10-17 MED ORDER — HEPARIN SODIUM (PORCINE) 1000 UNIT/ML IJ SOLN
INTRAMUSCULAR | Status: DC | PRN
  Administered 2019-10-17: 8000 [IU] via INTRAVENOUS
  Administered 2019-10-17 (×2): 2000 [IU] via INTRAVENOUS

## 2019-10-17 MED ORDER — SODIUM CHLORIDE 0.9 % IV SOLN
INTRAVENOUS | Status: DC
  Administered 2019-10-18: 950 mL via INTRAVENOUS

## 2019-10-17 MED ORDER — AMIODARONE HCL IN DEXTROSE 360-4.14 MG/200ML-% IV SOLN
30.0000 mg/h | INTRAVENOUS | Status: DC
  Administered 2019-10-17: 60 mg/h via INTRAVENOUS
  Filled 2019-10-17: qty 200

## 2019-10-17 MED ORDER — TRANEXAMIC ACID (OHS) PUMP PRIME SOLUTION
2.0000 mg/kg | INTRAVENOUS | Status: DC
  Filled 2019-10-17: qty 1.54

## 2019-10-17 MED ORDER — POTASSIUM CHLORIDE 10 MEQ/50ML IV SOLN: 10.0000 meq | INTRAVENOUS | Status: AC

## 2019-10-17 MED ORDER — TRANEXAMIC ACID (OHS) BOLUS VIA INFUSION
15.0000 mg/kg | INTRAVENOUS | Status: AC
  Administered 2019-10-17: 1156.5 mg via INTRAVENOUS
  Filled 2019-10-17: qty 1157

## 2019-10-17 MED ORDER — VANCOMYCIN HCL IN DEXTROSE 1-5 GM/200ML-% IV SOLN
1000.0000 mg | Freq: Once | INTRAVENOUS | Status: AC
  Administered 2019-10-18: 1000 mg via INTRAVENOUS
  Filled 2019-10-17: qty 200

## 2019-10-17 MED ORDER — LIDOCAINE 2% (20 MG/ML) 5 ML SYRINGE
INTRAMUSCULAR | Status: AC
  Filled 2019-10-17: qty 5

## 2019-10-17 MED ORDER — AMIODARONE HCL IN DEXTROSE 360-4.14 MG/200ML-% IV SOLN
60.0000 mg/h | INTRAVENOUS | Status: DC
  Administered 2019-10-17 – 2019-10-20 (×8): 30 mg/h via INTRAVENOUS
  Administered 2019-10-21 (×2): 60 mg/h via INTRAVENOUS
  Administered 2019-10-21: 30 mg/h via INTRAVENOUS
  Administered 2019-10-21 – 2019-10-22 (×2): 60 mg/h via INTRAVENOUS
  Filled 2019-10-17 (×13): qty 200

## 2019-10-17 MED ORDER — IOHEXOL 350 MG/ML SOLN
INTRAVENOUS | Status: DC | PRN
  Administered 2019-10-17: 120 mL via INTRA_ARTERIAL

## 2019-10-17 MED ORDER — ACETAMINOPHEN 500 MG PO TABS
1000.0000 mg | ORAL_TABLET | Freq: Four times a day (QID) | ORAL | Status: AC
  Administered 2019-10-18 – 2019-10-21 (×12): 1000 mg via ORAL
  Filled 2019-10-17 (×14): qty 2

## 2019-10-17 MED ORDER — AMIODARONE IV BOLUS ONLY 150 MG/100ML: INTRAVENOUS | Status: DC | PRN

## 2019-10-17 MED ORDER — ONDANSETRON HCL 4 MG/2ML IJ SOLN
4.0000 mg | Freq: Four times a day (QID) | INTRAMUSCULAR | Status: DC | PRN
  Administered 2019-10-18 – 2019-10-22 (×6): 4 mg via INTRAVENOUS
  Filled 2019-10-17 (×7): qty 2

## 2019-10-17 MED ORDER — MIDAZOLAM HCL 2 MG/2ML IJ SOLN: 2.0000 mg | INTRAMUSCULAR | Status: DC | PRN

## 2019-10-17 MED ORDER — MAGNESIUM SULFATE 4 GM/100ML IV SOLN
4.0000 g | Freq: Once | INTRAVENOUS | Status: AC
  Administered 2019-10-17: 4 g via INTRAVENOUS
  Filled 2019-10-17: qty 100

## 2019-10-17 MED ORDER — DOCUSATE SODIUM 100 MG PO CAPS
200.0000 mg | ORAL_CAPSULE | Freq: Every day | ORAL | Status: DC
  Administered 2019-10-18 – 2019-10-27 (×7): 200 mg via ORAL
  Filled 2019-10-17 (×7): qty 2

## 2019-10-17 MED ORDER — LACTATED RINGERS IV SOLN: INTRAVENOUS | Status: DC | PRN

## 2019-10-17 MED ORDER — POTASSIUM CHLORIDE 2 MEQ/ML IV SOLN
80.0000 meq | INTRAVENOUS | Status: DC
  Filled 2019-10-17 (×3): qty 40

## 2019-10-17 MED ORDER — FENTANYL CITRATE (PF) 250 MCG/5ML IJ SOLN
INTRAMUSCULAR | Status: DC | PRN
  Administered 2019-10-17: 150 ug via INTRAVENOUS
  Administered 2019-10-17: 50 ug via INTRAVENOUS
  Administered 2019-10-17: 200 ug via INTRAVENOUS
  Administered 2019-10-17: 50 ug via INTRAVENOUS
  Administered 2019-10-17: 250 ug via INTRAVENOUS
  Administered 2019-10-17: 50 ug via INTRAVENOUS
  Administered 2019-10-17 (×2): 250 ug via INTRAVENOUS
  Administered 2019-10-17: 50 ug via INTRAVENOUS

## 2019-10-17 MED ORDER — SODIUM CHLORIDE 0.9% FLUSH: 3.0000 mL | Freq: Two times a day (BID) | INTRAVENOUS | Status: DC

## 2019-10-17 MED ORDER — ARTIFICIAL TEARS OPHTHALMIC OINT
TOPICAL_OINTMENT | OPHTHALMIC | Status: AC
  Filled 2019-10-17: qty 3.5

## 2019-10-17 MED ORDER — SODIUM CHLORIDE 0.9 % IV SOLN: 250.0000 mL | INTRAVENOUS | Status: DC

## 2019-10-17 MED ORDER — SODIUM CHLORIDE 0.9 % IV SOLN
INTRAVENOUS | Status: DC | PRN
  Administered 2019-10-17: 750 mg via INTRAVENOUS

## 2019-10-17 MED ORDER — PROTAMINE SULFATE 10 MG/ML IV SOLN
INTRAVENOUS | Status: AC
  Filled 2019-10-17: qty 25

## 2019-10-17 SURGICAL SUPPLY — 107 items
ARTICLIP LAA PROCLIP II 45 (Clip) ×4 IMPLANT
BAG DECANTER FOR FLEXI CONT (MISCELLANEOUS) ×8 IMPLANT
BLADE CLIPPER SURG (BLADE) ×4 IMPLANT
BLADE STERNUM SYSTEM 6 (BLADE) ×4 IMPLANT
BLADE SURG 11 STRL SS (BLADE) ×4 IMPLANT
BNDG ELASTIC 4X5.8 VLCR STR LF (GAUZE/BANDAGES/DRESSINGS) ×4 IMPLANT
BNDG ELASTIC 6X5.8 VLCR STR LF (GAUZE/BANDAGES/DRESSINGS) ×8 IMPLANT
BNDG GAUZE ELAST 4 BULKY (GAUZE/BANDAGES/DRESSINGS) ×8 IMPLANT
CANISTER SUCT 3000ML PPV (MISCELLANEOUS) ×4 IMPLANT
CANNULA EZ GLIDE AORTIC 21FR (CANNULA) ×4 IMPLANT
CATH CPB KIT OWEN (MISCELLANEOUS) ×4 IMPLANT
CATH THORACIC 36FR (CATHETERS) ×4 IMPLANT
CLIP RETRACTION 3.0MM CORONARY (MISCELLANEOUS) ×4 IMPLANT
CLIP VESOCCLUDE MED 24/CT (CLIP) IMPLANT
CLIP VESOCCLUDE SM WIDE 24/CT (CLIP) IMPLANT
CONN ST 1/4X3/8  BEN (MISCELLANEOUS) ×2
CONN ST 1/4X3/8 BEN (MISCELLANEOUS) ×6 IMPLANT
DERMABOND ADVANCED (GAUZE/BANDAGES/DRESSINGS) ×1
DERMABOND ADVANCED .7 DNX12 (GAUZE/BANDAGES/DRESSINGS) ×3 IMPLANT
DEVICE ATRICLIP LAA PRCLPII 45 (Clip) ×3 IMPLANT
DRAIN CHANNEL 32F RND 10.7 FF (WOUND CARE) ×12 IMPLANT
DRAPE CARDIOVASCULAR INCISE (DRAPES) ×1
DRAPE INCISE IOBAN 66X45 STRL (DRAPES) ×4 IMPLANT
DRAPE SLUSH/WARMER DISC (DRAPES) ×4 IMPLANT
DRAPE SRG 135X102X78XABS (DRAPES) ×3 IMPLANT
DRSG AQUACEL AG ADV 3.5X14 (GAUZE/BANDAGES/DRESSINGS) ×4 IMPLANT
ELECT BLADE 4.0 EZ CLEAN MEGAD (MISCELLANEOUS) ×4
ELECT REM PT RETURN 9FT ADLT (ELECTROSURGICAL) ×8
ELECTRODE BLDE 4.0 EZ CLN MEGD (MISCELLANEOUS) ×3 IMPLANT
ELECTRODE REM PT RTRN 9FT ADLT (ELECTROSURGICAL) ×6 IMPLANT
FELT TEFLON 1X6 (MISCELLANEOUS) ×4 IMPLANT
FIBERTAPE STERNAL CLSR 2 36IN (SUTURE) ×16 IMPLANT
FIBERTAPE STERNAL CLSR 2X36 (SUTURE) ×16 IMPLANT
GAUZE SPONGE 4X4 12PLY STRL (GAUZE/BANDAGES/DRESSINGS) ×8 IMPLANT
GAUZE SPONGE 4X4 12PLY STRL LF (GAUZE/BANDAGES/DRESSINGS) ×12 IMPLANT
GLOVE BIO SURGEON STRL SZ 6.5 (GLOVE) ×8 IMPLANT
GLOVE ORTHO TXT STRL SZ7.5 (GLOVE) ×8 IMPLANT
GOWN STRL REUS W/ TWL LRG LVL3 (GOWN DISPOSABLE) ×15 IMPLANT
GOWN STRL REUS W/TWL LRG LVL3 (GOWN DISPOSABLE) ×5
HEMOSTAT POWDER SURGIFOAM 1G (HEMOSTASIS) ×12 IMPLANT
HEMOSTAT SURGICEL 2X14 (HEMOSTASIS) ×4 IMPLANT
INSERT FOGARTY XLG (MISCELLANEOUS) ×4 IMPLANT
KIT BASIN OR (CUSTOM PROCEDURE TRAY) ×4 IMPLANT
KIT SUCTION CATH 14FR (SUCTIONS) ×12 IMPLANT
KIT TURNOVER KIT B (KITS) ×4 IMPLANT
KIT VASOVIEW HEMOPRO 2 VH 4000 (KITS) ×4 IMPLANT
LEAD PACING MYOCARDI (MISCELLANEOUS) ×4 IMPLANT
MARKER GRAFT CORONARY BYPASS (MISCELLANEOUS) ×12 IMPLANT
NDL SUT PASSING CERCLAGE MED (SUTURE) ×4
NEEDLE SUT PASSING CERCLAG MED (SUTURE) ×3 IMPLANT
NS IRRIG 1000ML POUR BTL (IV SOLUTION) ×20 IMPLANT
PACK E OPEN HEART (SUTURE) ×4 IMPLANT
PACK OPEN HEART (CUSTOM PROCEDURE TRAY) ×4 IMPLANT
PAD ARMBOARD 7.5X6 YLW CONV (MISCELLANEOUS) ×8 IMPLANT
PAD ELECT DEFIB RADIOL ZOLL (MISCELLANEOUS) ×4 IMPLANT
PENCIL BUTTON HOLSTER BLD 10FT (ELECTRODE) ×4 IMPLANT
POSITIONER HEAD DONUT 9IN (MISCELLANEOUS) ×4 IMPLANT
PUNCH AORTIC ROTATE 4.0MM (MISCELLANEOUS) IMPLANT
PUNCH AORTIC ROTATE 4.5MM 8IN (MISCELLANEOUS) IMPLANT
PUNCH AORTIC ROTATE 5MM 8IN (MISCELLANEOUS) IMPLANT
SET CARDIOPLEGIA MPS 5001102 (MISCELLANEOUS) ×4 IMPLANT
SOL ANTI FOG 6CC (MISCELLANEOUS) ×3 IMPLANT
SOLUTION ANTI FOG 6CC (MISCELLANEOUS) ×1
SPONGE LAP 18X18 RF (DISPOSABLE) ×12 IMPLANT
SPONGE LAP 4X18 RFD (DISPOSABLE) IMPLANT
SUPPORT HEART JANKE-BARRON (MISCELLANEOUS) ×4 IMPLANT
SUT BONE WAX W31G (SUTURE) ×4 IMPLANT
SUT ETHIBOND X763 2 0 SH 1 (SUTURE) ×12 IMPLANT
SUT MNCRL AB 3-0 PS2 18 (SUTURE) ×8 IMPLANT
SUT MNCRL AB 4-0 PS2 18 (SUTURE) ×4 IMPLANT
SUT PDS AB 1 CTX 36 (SUTURE) ×8 IMPLANT
SUT PROLENE 2 0 SH DA (SUTURE) IMPLANT
SUT PROLENE 3 0 SH DA (SUTURE) ×4 IMPLANT
SUT PROLENE 3 0 SH1 36 (SUTURE) ×4 IMPLANT
SUT PROLENE 4 0 RB 1 (SUTURE) ×1
SUT PROLENE 4 0 SH DA (SUTURE) ×8 IMPLANT
SUT PROLENE 4-0 RB1 .5 CRCL 36 (SUTURE) ×3 IMPLANT
SUT PROLENE 5 0 C 1 36 (SUTURE) ×8 IMPLANT
SUT PROLENE 6 0 C 1 30 (SUTURE) ×8 IMPLANT
SUT PROLENE 7.0 RB 3 (SUTURE) ×20 IMPLANT
SUT PROLENE 8 0 BV175 6 (SUTURE) ×12 IMPLANT
SUT PROLENE BLUE 7 0 (SUTURE) ×4 IMPLANT
SUT PROLENE POLY MONO (SUTURE) IMPLANT
SUT SILK  1 MH (SUTURE) ×2
SUT SILK 1 MH (SUTURE) ×6 IMPLANT
SUT SILK 3 0 SH CR/8 (SUTURE) ×4 IMPLANT
SUT STEEL 6MS V (SUTURE) IMPLANT
SUT STEEL STERNAL CCS#1 18IN (SUTURE) IMPLANT
SUT STEEL SZ 6 DBL 3X14 BALL (SUTURE) IMPLANT
SUT VIC AB 1 CTX 36 (SUTURE)
SUT VIC AB 1 CTX36XBRD ANBCTR (SUTURE) IMPLANT
SUT VIC AB 2-0 CT1 27 (SUTURE) ×1
SUT VIC AB 2-0 CT1 TAPERPNT 27 (SUTURE) ×3 IMPLANT
SUT VIC AB 2-0 CTX 27 (SUTURE) IMPLANT
SUT VIC AB 3-0 SH 27 (SUTURE)
SUT VIC AB 3-0 SH 27X BRD (SUTURE) IMPLANT
SUT VIC AB 3-0 X1 27 (SUTURE) IMPLANT
SUT VICRYL 4-0 PS2 18IN ABS (SUTURE) IMPLANT
SYSTEM SAHARA CHEST DRAIN ATS (WOUND CARE) ×8 IMPLANT
TAPE CLOTH SURG 4X10 WHT LF (GAUZE/BANDAGES/DRESSINGS) ×4 IMPLANT
TAPE PAPER 2X10 WHT MICROPORE (GAUZE/BANDAGES/DRESSINGS) ×4 IMPLANT
TOWEL GREEN STERILE (TOWEL DISPOSABLE) ×4 IMPLANT
TOWEL GREEN STERILE FF (TOWEL DISPOSABLE) ×4 IMPLANT
TRAY FOLEY SLVR 16FR TEMP STAT (SET/KITS/TRAYS/PACK) ×4 IMPLANT
TUBING LAP HI FLOW INSUFFLATIO (TUBING) ×4 IMPLANT
UNDERPAD 30X36 HEAVY ABSORB (UNDERPADS AND DIAPERS) ×4 IMPLANT
WATER STERILE IRR 1000ML POUR (IV SOLUTION) ×8 IMPLANT

## 2019-10-17 SURGICAL SUPPLY — 26 items
BALLN IABP SENSA PLUS 8F 50CC (BALLOONS) ×2
BALLN SAPPHIRE 2.5X12 (BALLOONS) ×4
BALLN SAPPHIRE ~~LOC~~ 3.0X15 (BALLOONS) ×2 IMPLANT
BALLOON IABP SENS PLUS 8F 50CC (BALLOONS) ×1 IMPLANT
BALLOON SAPPHIRE 2.5X12 (BALLOONS) ×2 IMPLANT
CATH 5FR JL3.5 JR4 ANG PIG MP (CATHETERS) ×2 IMPLANT
CATH LAUNCHER 6FR EBU3.5 (CATHETERS) ×2 IMPLANT
CATH OPTICROSS HD (CATHETERS) ×2 IMPLANT
CATH SWAN GANZ VIP 7.5F (CATHETERS) ×2 IMPLANT
DEVICE RAD COMP TR BAND LRG (VASCULAR PRODUCTS) ×2 IMPLANT
GLIDESHEATH SLEND SS 6F .021 (SHEATH) ×2 IMPLANT
GUIDEWIRE INQWIRE 1.5J.035X260 (WIRE) ×1 IMPLANT
INQWIRE 1.5J .035X260CM (WIRE) ×2
KIT ENCORE 26 ADVANTAGE (KITS) ×2 IMPLANT
KIT HEART LEFT (KITS) ×2 IMPLANT
KIT MICROPUNCTURE NIT STIFF (SHEATH) ×2 IMPLANT
PACK CARDIAC CATHETERIZATION (CUSTOM PROCEDURE TRAY) ×2 IMPLANT
SHEATH PINNACLE 5F 10CM (SHEATH) ×2 IMPLANT
SHEATH PINNACLE 8F 10CM (SHEATH) ×2 IMPLANT
SHEATH PROBE COVER 6X72 (BAG) ×2 IMPLANT
SLED PULL BACK IVUS (MISCELLANEOUS) ×2 IMPLANT
SLEEVE REPOSITIONING LENGTH 30 (MISCELLANEOUS) ×2 IMPLANT
TRANSDUCER W/STOPCOCK (MISCELLANEOUS) ×4 IMPLANT
TUBING CIL FLEX 10 FLL-RA (TUBING) ×2 IMPLANT
WIRE COUGAR XT STRL 190CM (WIRE) ×2 IMPLANT
WIRE RUNTHROUGH .014X180CM (WIRE) ×2 IMPLANT

## 2019-10-17 NOTE — Consult Note (Signed)
Advanced Heart Failure Team Consult Note   Primary Physician: Venia Carbon, MD PCP-Cardiologist:  Mertie Moores, MD  Reason for Consultation: ?Cardiogenic shock with anterior MI  HPI:    Cody Arellano is seen today for evaluation of cardiogenic shock at the request of Dr. Saunders Revel.   Patient has history of CAD, SLE, HTN, hyperlipidemia. He had acute MI in 2008 with stents in the LAD and ramus.  He had 1 additional stent in 2009.  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 He  takes Plaquenil and Imuran for SLE.  Also of note, he had transient atrial fibrillation when he had SLE pericarditis, has not been anticoagulated. 30 day monitor in 12/20 showed no atrial fibrillation.  Echo in 12/20 showed EF 65-70% with normal RV.   Patient developed severe substernal chest pain today, consistent with prior MI pain.  He came to the ER, ECG showed NSR, RBBB, acute anterior MI.   Patient was brought to cath lab.  LHC showed non-dominant RCA, proximal LCx with diffuse disease approximately 60-70%, severe 90% in-stent restenosis in the proximal ramus (moderate-large vessel), totally occluded ostial LAD within the previous stent.  Patient underwent balloon angioplasty to restore flow to the LAD and then the ramus.  RHC was then done, showing mean RA 3, PA 36/11, mean PCWP 14 with CI 2.28 by Fick and 1.98 by thermo.  Chest pain was improved. He was started on norepinephrine 5 with fall in BP during procedure.   Dr. Saunders Revel and I discussed further plan.  It was decided to proceed with PCI to ostial LAD and possibly the ramus (wired to protect).  Given cardiac index 2-2.5 and relatively normal filling pressures, we elected to place IABP to protect him during procedure.  Will plan to leave IABP in place.   Review of Systems: All systems reviewed and negative except as per HPI.   Home Medications Prior to Admission medications   Medication Sig Start Date End Date Taking? Authorizing Provider    aspirin EC 81 MG tablet Take 1 tablet (81 mg total) by mouth daily. 08/10/14   Nahser, Wonda Cheng, MD  azaTHIOprine (IMURAN) 50 MG tablet Take 50 mg by mouth daily.  10/11/14   [provider]  hydroxychloroquine (PLAQUENIL) 200 MG tablet Take 200 mg by mouth at bedtime.    [provider]  ibuprofen (ADVIL,MOTRIN) 600 MG tablet Take 1 tablet (600 mg total) by mouth every 8 (eight) hours as needed. 11/03/14   Noemi Chapel, MD  OVER THE COUNTER MEDICATION Vitamin B 12 500 mcq. One tablet daily.    [provider]  rosuvastatin (CRESTOR) 10 MG tablet TAKE 1 TABLET(10 MG) BY MOUTH DAILY 04/09/19   Nahser, Wonda Cheng, MD  sertraline (ZOLOFT) 50 MG tablet Take 50 mg by mouth daily.    [provider]  tamsulosin (FLOMAX) 0.4 MG CAPS capsule Take 0.4 mg by mouth daily.  04/02/17   [provider]    Past Medical History: Past Medical History:  Diagnosis Date  . Anxiety   . Chronic tension headache   . Colon polyps   . Coronary artery disease 2008/2009   MI with PCI x 2, then PCI x 1 in 2009  . Depression   . Dyslipidemia   . Dysrhythmia    atrial fibrillation  . Hyperlipidemia   . Hypertension   . Lupus [facility])    sees Dr Trudie Reed  . Lupus disease of the lung   .  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 Myocardial infarction ([place]) 2008  . Shortness of breath     Past Surgical History: Past Surgical History:  Procedure Laterality Date  . CARDIAC CATHETERIZATION    . coronary stents  2009    Family History: Family History  Problem Relation Age of Onset  . Heart disease Mother   . Alcohol abuse Father   . Hyperlipidemia Sister   . Hypertension Sister   . Hyperlipidemia Brother   . Hypertension Brother   . Cancer Brother        ?lung cancer  . Stomach cancer Brother   . Diabetes Paternal Uncle   . Colon cancer Neg Hx   . Esophageal cancer Neg Hx   . Rectal cancer Neg Hx     Social History: Social History   Socioeconomic History  . Marital  status: Married    Spouse name: Not on file  . Number of children: 5  . Years of education: Not on file  . Highest education level: Not on file  Occupational History  . Occupation: Engineer, production for [employer]    Comment: retired  Tobacco Use  . Smoking status: Former Smoker    Packs/day: 1.00    Years: 20.00    Pack years: 20.00    Types: Cigarettes    Quit date: 02/26/1993    Years since quitting: 26.6  . Smokeless tobacco: Never Used  . Tobacco comment: QUIT SMOKING 20 YEARS AGO  Substance and Sexual Activity  . Alcohol use: No  . Drug use: No  . Sexual activity: Not on file  Other Topics Concern  . Not on file  Social History Narrative   No living will   Wife should make health care decisions--alternate is sons   Would accept resuscitation   Not sure about tube feeds   Social Determinants of Health   Financial Resource Strain:   . Difficulty of Paying Living Expenses:   Food Insecurity:   . Worried About Charity fundraiser in the Last Year:   . Arboriculturist in the Last Year:   Transportation Needs:   . Film/video editor (Medical):   [name] Lack of Transportation (Non-Medical):   Physical Activity:   . Days of Exercise per Week:   . Minutes of Exercise per Session:   Stress:   . Feeling of Stress :   Social Connections:   . Frequency of Communication with Friends and Family:   . Frequency of Social Gatherings with Friends and Family:   . Attends Religious Services:   . Active Member of Clubs or Organizations:   . Attends Archivist Meetings:   [name] Marital Status:     Allergies:  No Known Allergies  Objective:    Vital Signs:   Temp:  [96.5 F (35.8 C)] 96.5 F (35.8 C) (05/15 1129) Pulse Rate:  [69] 69 (05/15 1129) Resp:  [17] 17 (05/15 1129) BP: (101)/(74) 101/74 (05/15 1129) SpO2:  [96 %-98 %] 98 % (05/15 1156) Weight:  [77.1 kg] 77.1 kg (05/15 1128)    Weight change: Filed Weights   10/17/19 1128  Weight: 77.1 kg     Intake/Output:  No intake or output data in the 24 hours ending 10/17/19 1317    Physical Exam    General:  Well appearing. No resp difficulty HEENT: normal Neck: supple. JVP not elevated. Carotids 2+ bilat; no bruits. No lymphadenopathy or thyromegaly appreciated. Cor: PMI nondisplaced. Regular rate &  rhythm. No rubs, gallops or murmurs. Lungs: clear Abdomen: soft, nontender, nondistended. No hepatosplenomegaly. No bruits or masses. Good bowel sounds. Extremities: no cyanosis, clubbing, rash, edema Neuro: alert & orientedx3, cranial nerves grossly intact. moves all 4 extremities w/o difficulty. Affect pleasant   EKG    NSR, RBBB, acute anterior MI (personally reviewed)  Labs   Basic Metabolic Panel: Recent Labs  Lab 10/17/19 1155  NA 142  K 4.7  CL 112*  CO2 20*  GLUCOSE 91  BUN 10  CREATININE 1.19  CALCIUM 8.6*    Liver Function Tests: Recent Labs  Lab 10/17/19 1155  AST 29  ALT 12  ALKPHOS 71  BILITOT 1.2  PROT 6.5  ALBUMIN 3.2*   No results for input(s): LIPASE, AMYLASE in the last 168 hours. No results for input(s): AMMONIA in the last 168 hours.  CBC: Recent Labs  Lab 10/17/19 1205  WBC 6.5  HGB 14.2  HCT 43.2  MCV 96.9  PLT 198    Cardiac Enzymes: No results for input(s): CKTOTAL, CKMB, CKMBINDEX, TROPONINI in the last 168 hours.  BNP: BNP (last 3 results) No results for input(s): BNP in the last 8760 hours.  ProBNP (last 3 results) No results for input(s): PROBNP in the last 8760 hours.   CBG: No results for input(s): GLUCAP in the last 168 hours.  Coagulation Studies: Recent Labs    10/17/19 1205  LABPROT 13.4  INR 1.1     Imaging    No results found.   Medications:     Current Medications:   Infusions: . cangrelor 50 mg in NS 250 mL 4 mcg/kg/min (10/17/19 1216)  . norepinephrine (LEVOPHED) 4mg  / 244mL infusion 5 mcg/min (10/17/19 1230)      Assessment/Plan   1. CAD: History of prior MI with stents  in ostial/proximal ramus and ostial/proximal LAD.  Now with acute anterior MI and occluded ostial LAD + 90% stenosis ostial/proximal ramus (moderate-large vessel).  LCx with 60-70% proximal stenosis, nondominant RCA.   - PTCA to ostial ramus and LAD, will attempt to stent ostial LAD.  2. Cardiogenic shock: Patient currently on norepinphrine 5 with stable hemodynamics.  CI 2-2.3 with normal filling pressures.  - IABP placed given relatively stable hemodynamics to protect during PCI.  Luiz Blare in place.  3. SLE: Continue home azathioprine and Plaquenil.   Length of Stay: 0  Loralie Champagne, MD  10/17/2019, 1:17 PM  Advanced Heart Failure Team Pager [phone] (M-F; 7a - 4p)  Please contact [facility] for night-coverage after hours (4p -7a ) and weekends on [email or web address]

## 2019-10-17 NOTE — Brief Op Note (Signed)
10/17/2019  6:11 PM  PATIENT:  Cody Arellano  74 y.o. male  PRE-OPERATIVE DIAGNOSIS:  CAD  POST-OPERATIVE DIAGNOSIS:  * No post-op diagnosis entered *  PROCEDURE:  Procedure(s):  CORONARY ARTERY BYPASS GRAFTING x 2 -Left Internal Mammary Artery to Distal Left Anterior Descending Artery -Saphenous vein graft to Ramus Intermediate  ENDOSCOPIC HARVEST GREATER SAPHENOUS VEIN -Right Thigh  LYSIS OF ADHESIONS  Transesophageal Echocardiogram (Tee) Clipping Of Atrial Appendage using AtriCure PRO245 45 MM AtriClip. (N/A)  SURGEON:  Surgeon(s) and Role:    * Rexene Alberts, MD - Primary  PHYSICIAN ASSISTANT: Erin Barrett PA-C  ANESTHESIA:   general  EBL:  Per perfusion record   BLOOD ADMINISTERED: CELLSAVER  DRAINS: Left and right pleural chest tubes, mediastinal chest drains   LOCAL MEDICATIONS USED:  NONE  SPECIMEN:  No Specimen  DISPOSITION OF SPECIMEN:  N/A  COUNTS:  YES  TOURNIQUET:  * No tourniquets in log *  DICTATION: .Dragon Dictation  PLAN OF CARE: Admit to inpatient   PATIENT DISPOSITION:  ICU - intubated and hemodynamically stable.   Delay start of Pharmacological VTE agent (>24hrs) due to surgical blood loss or risk of bleeding: yes

## 2019-10-17 NOTE — Anesthesia Postprocedure Evaluation (Signed)
Anesthesia Post Note  Patient: Thomas Buitrago  Procedure(s) Performed: CORONARY ARTERY BYPASS GRAFTING (CABG) using LIMA to LAD; Endoscopic harvest right greater saphenous vein: SVG to RAMUS. (N/A Chest) Transesophageal Echocardiogram (Tee) Clipping Of Atrial Appendage using AtriCure PRO245 45 MM AtriClip. (N/A Chest) Endovein Harvest Of Greater Saphenous Vein (Right Leg Upper)     Patient location during evaluation: SICU Anesthesia Type: General Level of consciousness: sedated and patient remains intubated per anesthesia plan Pain management: pain level controlled Vital Signs Assessment: post-procedure vital signs reviewed and stable Respiratory status: patient remains intubated per anesthesia plan and patient on ventilator - see flowsheet for VS Cardiovascular status: stable Postop Assessment: no apparent nausea or vomiting Anesthetic complications: no    Last Vitals:  Vitals:   10/17/19 1156 10/17/19 1321  BP:  (!) 113/55  Pulse:    Resp:    Temp:    SpO2: 98%     Last Pain:  Vitals:   10/17/19 1458  TempSrc:   PainSc: 0-No pain                 JACKSON,E. CARSWELL

## 2019-10-17 NOTE — Consult Note (Addendum)
[address]       [place] [address]             [phone]          CARDIOTHORACIC SURGERY CONSULTATION REPORT  PCP is Venia Carbon, MD Referring Provider is Nelva Bush, MD Primary Cardiologist is Mertie Moores, MD  Reason for consultation:  Acute ST-segment Elevation Myocardial Infarction  HPI:  Patient is a 74 year old African-American male with history of coronary artery disease, hypertension, hyperlipidemia, paroxysmal atrial fibrillation and lupus who is referred for emergent management of acute ST segment elevation myocardial infarction.  Patient's cardiac history dates back to 2008 when he presented with an acute anterior wall myocardial infarction.  He underwent PCI and stenting x2 and reportedly had follow-up PCI in 2009.  At the time he lived in [place] and details are not currently available.  More recently the patient has been followed by Dr. 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  He has been maintaining sinus rhythm ever since and is not on long-term anticoagulation.  Patient states that he was in his usual state of health until today when he developed sudden onset of chest pain associated with shortness of breath after just having finished mowing his yard.  Chest pain became progressively severe over 15 minutes, ultimately prompting the patient and his family to summon EMS.  EKG revealed anterolateral ST segment elevation and the patient was brought directly to the Cath Lab following arrival in the emergency department.  Diagnostic cardiac catheterization performed by Dr. Saunders Revel revealed acute 100% occlusion of the proximal left anterior descending coronary artery at site of previous stent.  There was 90% proximal stenosis of a large ramus intermediate branch.  Intra-aortic balloon pump was placed and primary balloon angioplasty was performed restoring TIMI-3  flow in the left anterior descending coronary artery.  Attempts to proceed with definitive PCI and stenting of the left anterior descending coronary artery were aborted due to extremely unfavorable anatomy.  Right heart catheterization was performed and emergency cardiothoracic surgical consultation was requested.  Patient is married and lives locally in [place] with his wife.  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  He has remained reasonably active physically although he does not exercise on a regular basis.  He denies any recent history of exertional chest pain prior to the development of chest pain earlier today.  He describes stable symptoms of mild exertional shortness of breath.  He denies any history of resting shortness of breath, PND, orthopnea, or lower extremity edema.  He has not had recent fevers or chills.  He has been vaccinated for COVID-19.  Past Medical History:  Diagnosis Date  . Anxiety   . Chronic tension headache   . Colon polyps   . Coronary artery disease 2008/2009   MI with PCI x 2, then PCI x 1 in 2009  . Depression   . Dyslipidemia   . Dysrhythmia    atrial fibrillation  . Hyperlipidemia   . Hypertension   . Lupus [facility])    sees Dr Trudie Reed  . Lupus disease of the lung   . 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 Myocardial infarction ([place]) 2008  . Shortness of breath     Past Surgical History:  Procedure Laterality Date  . CARDIAC CATHETERIZATION    .  coronary stents  2009    Family History  Problem Relation Age of Onset  . Heart disease Mother   . Alcohol abuse Father   . Hyperlipidemia Sister   . Hypertension Sister   . Hyperlipidemia Brother   . Hypertension Brother   . Cancer Brother        ?lung cancer  . Stomach cancer Brother   . Diabetes Paternal Uncle   . Colon cancer Neg Hx   . Esophageal cancer Neg Hx   . Rectal cancer Neg Hx     Social History   Socioeconomic History  .  Marital status: Married    Spouse name: Not on file  . Number of children: 5  . Years of education: Not on file  . Highest education level: Not on file  Occupational History  . Occupation: Engineer, production for [employer]    Comment: retired  Tobacco Use  . Smoking status: Former Smoker    Packs/day: 1.00    Years: 20.00    Pack years: 20.00    Types: Cigarettes    Quit date: 02/26/1993    Years since quitting: 26.6  . Smokeless tobacco: Never Used  . Tobacco comment: QUIT SMOKING 20 YEARS AGO  Substance and Sexual Activity  . Alcohol use: No  . Drug use: No  . Sexual activity: Not on file  Other Topics Concern  . Not on file  Social History Narrative   No living will   Wife should make health care decisions--alternate is sons   Would accept resuscitation   Not sure about tube feeds   Social Determinants of Health   Financial Resource Strain:   . Difficulty of Paying Living Expenses:   Food Insecurity:   . Worried About Charity fundraiser in the Last Year:   . Arboriculturist in the Last Year:   Transportation Needs:   . Film/video editor (Medical):   [name] Lack of Transportation (Non-Medical):   Physical Activity:   . Days of Exercise per Week:   . Minutes of Exercise per Session:   Stress:   . Feeling of Stress :   Social Connections:   . Frequency of Communication with Friends and Family:   . Frequency of Social Gatherings with Friends and Family:   . Attends Religious Services:   . Active Member of Clubs or Organizations:   . Attends Archivist Meetings:   [name] Marital Status:   Intimate Partner Violence:   . Fear of Current or Ex-Partner:   . Emotionally Abused:   [name] Physically Abused:   . Sexually Abused:     Prior to Admission medications   Medication Sig Start Date End Date Taking? Authorizing Provider  aspirin EC 81 MG tablet Take 1 tablet (81 mg total) by mouth daily. 08/10/14   Nahser, Wonda Cheng, MD  azaTHIOprine (IMURAN) 50 MG  tablet Take 50 mg by mouth daily.  10/11/14   [provider]  hydroxychloroquine (PLAQUENIL) 200 MG tablet Take 200 mg by mouth at bedtime.    [provider]  ibuprofen (ADVIL,MOTRIN) 600 MG tablet Take 1 tablet (600 mg total) by mouth every 8 (eight) hours as needed. 11/03/14   Noemi Chapel, MD  OVER THE COUNTER MEDICATION Vitamin B 12 500 mcq. One tablet daily.    [provider]  rosuvastatin (CRESTOR) 10 MG tablet TAKE 1 TABLET(10 MG) BY MOUTH DAILY 04/09/19   Nahser, Wonda Cheng, MD  sertraline (ZOLOFT) 50  MG tablet Take 50 mg by mouth daily.    [provider]  tamsulosin (FLOMAX) 0.4 MG CAPS capsule Take 0.4 mg by mouth daily.  04/02/17   [provider]    Current Facility-Administered Medications  Medication Dose Route Frequency Provider Last Rate Last Admin  . 0.9 %  sodium chloride infusion    Continuous PRN End, Christopher, MD 50 mL/hr at 10/17/19 1200 50 mL/hr at 10/17/19 1200  . 0.9 %  sodium chloride infusion    Continuous PRN End, Christopher, MD 10 mL/hr at 10/17/19 1334 10 mL/hr at 10/17/19 1334  . cangrelor [facility]) 50,000 mcg in sodium chloride 0.9 % 250 mL (200 mcg/mL) infusion    Continuous PRN End, Christopher, MD 92.5 mL/hr at 10/17/19 1445 4 mcg/kg/min at 10/17/19 1445  . cangrelor [facility]) bolus via infusion    PRN End, Harrell Gave, MD   2,313 mcg at 10/17/19 1214  . cefUROXime (ZINACEF) 1.5 g in sodium chloride 0.9 % 100 mL IVPB  1.5 g Intravenous To OR Rexene Alberts, MD      . cefUROXime (ZINACEF) 750 mg in sodium chloride 0.9 % 100 mL IVPB  750 mg Intravenous To OR Rexene Alberts, MD      . dexmedetomidine (PRECEDEX) 400 MCG/100ML (4 mcg/mL) infusion  0.1-0.7 mcg/kg/hr Intravenous To OR Rexene Alberts, MD      . DOPamine (INTROPIN) 800 mg in dextrose 5 % 250 mL (3.2 mg/mL) infusion  0-10 mcg/kg/min Intravenous To OR Rexene Alberts, MD      . EPINEPHrine (ADRENALIN) 4 mg in NS 250 mL (0.016 mg/mL) premix infusion   0-10 mcg/min Intravenous To OR Rexene Alberts, MD      . Heparin (Porcine) in NaCl 1000-0.9 UT/500ML-% SOLN    PRN End, Harrell Gave, MD   500 mL at 10/17/19 1310  . heparin 30,000 units/NS 1000 mL solution for CELLSAVER   Other To OR Rexene Alberts, MD      . heparin sodium (porcine) 2,500 Units, papaverine 30 mg in electrolyte-148 (PLASMALYTE-148) 500 mL irrigation   Irrigation To OR Rexene Alberts, MD      . heparin sodium (porcine) injection    PRN Nelva Bush, MD   2,000 Units at 10/17/19 1312  . insulin regular, human (MYXREDLIN) 100 units/ 100 mL infusion   Intravenous To OR Rexene Alberts, MD      . iohexol (OMNIPAQUE) 350 MG/ML injection    PRN End, Harrell Gave, MD   120 mL at 10/17/19 1433  . lidocaine (PF) (XYLOCAINE) 1 % injection    PRN End, Harrell Gave, MD   15 mL at 10/17/19 1240  . magnesium sulfate (IV Push/IM) injection 40 mEq  40 mEq Other To OR Rexene Alberts, MD      . midazolam (VERSED) injection    PRN End, Harrell Gave, MD   0.5 mg at 10/17/19 1328  . milrinone (PRIMACOR) 20 MG/100 ML (0.2 mg/mL) infusion  0.3 mcg/kg/min Intravenous To OR Rexene Alberts, MD      . nitroGLYCERIN 50 mg in dextrose 5 % 250 mL (0.2 mg/mL) infusion  2-200 mcg/min Intravenous To OR Rexene Alberts, MD      . norepinephrine (LEVOPHED) 4 mg in dextrose 5 % 250 mL (0.016 mg/mL) infusion    Continuous PRN End, Christopher, MD 18.75 mL/hr at 10/17/19 1230 5 mcg/min at 10/17/19 1230  . norepinephrine (LEVOPHED) 4mg  in 281mL premix infusion  0-40 mcg/min Intravenous STAT Rexene Alberts,  MD      . phenylephrine (NEOSYNEPHRINE) 20-0.9 MG/250ML-% infusion  30-200 mcg/min Intravenous To OR Rexene Alberts, MD      . potassium chloride injection 80 mEq  80 mEq Other To OR Rexene Alberts, MD      . Radial Cocktail/Verapamil only    PRN End, Harrell Gave, MD   10 mL at 10/17/19 1159  . tranexamic acid (CYKLOKAPRON) 2,500 mg in sodium chloride 0.9 % 250 mL (10 mg/mL) infusion  1.5 mg/kg/hr  Intravenous To OR Rexene Alberts, MD      . tranexamic acid (CYKLOKAPRON) bolus via infusion - over 30 minutes 1,156.5 mg  15 mg/kg Intravenous To OR Rexene Alberts, MD      . tranexamic acid (CYKLOKAPRON) pump prime solution 154 mg  2 mg/kg Intracatheter To OR Rexene Alberts, MD      . vancomycin (VANCOCIN) 1,000 mg in sodium chloride 0.9 % 1,000 mL irrigation   Irrigation To OR Rexene Alberts, MD      . vancomycin (VANCOREADY) IVPB 1250 mg/250 mL  1,250 mg Intravenous To OR Rexene Alberts, MD        No Known Allergies    Review of Systems:   Per HPI - remainder non-contributory    Physical Exam:   BP (!) 113/55   Pulse 69   Temp (!) 96.5 F (35.8 C) (Temporal)   Resp 17   Ht 5\' 10"  (1.778 m)   Wt 77.1 kg   SpO2 98%   BMI 24.39 kg/m   General:  AA male in NAD on cath lab table  HEENT:  Unremarkable   Neck:   no JVD, no bruits, no adenopathy   Chest:   clear to auscultation, symmetrical breath sounds, no wheezes, no rhonchi   CV:   RRR, no murmur   Abdomen:  soft, non-tender, no masses   Extremities:  IABP in place via right femoral artery, right radial sheath in place, extremities well-perfused, pulses palpable, no lower extremity edema  Rectal/GU  Deferred  Neuro:   Grossly non-focal and symmetrical throughout  Skin:   Clean and dry, no rashes, no breakdown  Diagnostic Tests:  Lab Results: Recent Labs    10/17/19 1205  WBC 6.5  HGB 14.2  HCT 43.2  PLT 198   BMET:  Recent Labs    10/17/19 1155  NA 142  K 4.7  CL 112*  CO2 20*  GLUCOSE 91  BUN 10  CREATININE 1.19  CALCIUM 8.6*    CBG (last 3)  No results for input(s): GLUCAP in the last 72 hours. PT/INR:   Recent Labs    10/17/19 1205  LABPROT 13.4  INR 1.1    CXR:  N/A  Coronary/Graft Acute MI Revascularization  CORONARY BALLOON ANGIOPLASTY  IABP Insertion  RIGHT/LEFT HEART CATH AND CORONARY ANGIOGRAPHY  Conclusion   Ost Cx to Prox Cx lesion is 50% stenosed.  Ramus lesion  is 90% stenosed.  1st Diag lesion is 90% stenosed.  Prox LAD lesion is 100% stenosed.  Ost LAD to Prox LAD lesion is 100% stenosed.  Balloon angioplasty was performed using a BALLOON SAPPHIRE [place] 3.0X15.  Post intervention, there is a 50% residual stenosis.  Post intervention, there is a 50% residual stenosis.    Indications  ST elevation myocardial infarction involving left anterior descending (LAD) coronary artery (HCC) [I21.02 (ICD-10-CM)]  Medications (Filter: Administrations occurring from 10/17/19 1147 to 10/17/19 1507) (important)  Continuous medications are totaled by  the amount administered until 10/17/19 1507.  lidocaine (PF) (XYLOCAINE) 1 % injection (mL) Total volume:  20 mL Date/Time  Rate/Dose/Volume Action  10/17/19 1158  5 mL Given  1240  15 mL Given    Radial Cocktail/Verapamil only (mL) Total volume:  10 mL Date/Time  Rate/Dose/Volume Action  10/17/19 1159  10 mL Given    heparin sodium (porcine) injection (Units) Total dose:  12,000 Units Date/Time  Rate/Dose/Volume Action  10/17/19 1201  8,000 Units Given  1215  2,000 Units Given  1312  2,000 Units Given    cangrelor (KENGREAL) 50,000 mcg in sodium chloride 0.9 % 250 mL (200 mcg/mL) infusion (mcg/kg/min) Total dose:  Cannot be calculated* Dosing weight:  77.1 *Continuous medication not stopped within the calculation time range. Date/Time  Rate/Dose/Volume Action  10/17/19 1216  4 mcg/kg/min - 92.5 mL/hr New Bag/Given  1445  4 mcg/kg/min - 92.5 mL/hr New Bag/Given    cangrelor [facility]) bolus via infusion (mcg/kg) Total dose:  2,313 mcg Dosing weight:  77.1 Date/Time  Rate/Dose/Volume Action  10/17/19 1214  2,313 mcg Given    norepinephrine (LEVOPHED) 4 mg in dextrose 5 % 250 mL (0.016 mg/mL) infusion (mcg/min) Total dose:  Cannot be calculated* Dosing weight:  77.1 *Continuous medication not stopped within the calculation time range. Date/Time  Rate/Dose/Volume Action  10/17/19 1230  5 mcg/min  - 18.75 mL/hr New Bag/Given    Heparin (Porcine) in NaCl 1000-0.9 UT/500ML-% SOLN (mL) Total volume:  1,500 mL Date/Time  Rate/Dose/Volume Action  10/17/19 1301  500 mL Given  1301  500 mL Given  1310  500 mL Given    0.9 % sodium chloride infusion (mL/hr) Total dose:  Cannot be calculated* Dosing weight:  77.1 *Continuous medication not stopped within the calculation time range. Date/Time  Rate/Dose/Volume Action  10/17/19 1200  50 mL/hr New Bag/Given    midazolam (VERSED) injection (mg) Total dose:  0.5 mg Date/Time  Rate/Dose/Volume Action  10/17/19 1328  0.5 mg Given    0.9 % sodium chloride infusion (mL/hr) Total dose:  Cannot be calculated* Dosing weight:  77.1 *Continuous medication not stopped within the calculation time range. Date/Time  Rate/Dose/Volume Action  10/17/19 1334  10 mL/hr New Bag/Given    iohexol (OMNIPAQUE) 350 MG/ML injection (mL) Total volume:  120 mL Date/Time  Rate/Dose/Volume Action  10/17/19 1433  120 mL Given    norepinephrine (LEVOPHED) 4mg  in 274mL premix infusion (mcg/min) Total dose:  Cannot be calculated* Dosing weight:  77.1 *Continuous medication not stopped within the calculation time range. Date/Time  Rate/Dose/Volume Action  10/17/19 1400  5 mcg/min - 18.75 mL/hr New Bag/Given    Sedation Time  Sedation Time Physician-1: 56 minutes 24 seconds  Contrast  Medication Name Total Dose  iohexol (OMNIPAQUE) 350 MG/ML injection 120 mL    Radiation/Fluoro  Fluoro time: 23.1 (min) DAP: 54029 (mGycm2) Cumulative Air Kerma: [id number] (mGy)  Coronary Findings  Diagnostic Dominance: Left Left Main  Vessel is large. Vessel is angiographically normal.  Left Anterior Descending  Ost LAD to Prox LAD lesion 100% stenosed  Ost LAD to Prox LAD lesion is 100% stenosed. The lesion is thrombotic.  Prox LAD lesion 100% stenosed  Prox LAD lesion is 100% stenosed. The lesion was previously treatedover 2 years ago.  First Diagonal Branch  Vessel  is small in size.  1st Diag lesion 90% stenosed  1st Diag lesion is 90% stenosed.  Second Diagonal Branch  Vessel is moderate in size.  Third Public librarian  is small in size.  Ramus Intermedius  Vessel is large.  Ramus lesion 90% stenosed  Ramus lesion is 90% stenosed. The lesion was previously treatedover 2 years ago.  Left Circumflex  Vessel is large.  Ost Cx to Prox Cx lesion 50% stenosed  Ost Cx to Prox Cx lesion is 50% stenosed.  First Obtuse Marginal Branch  Vessel is small in size.  Second Obtuse Marginal Branch  Vessel is small in size.  Third Obtuse Marginal Branch  Vessel is small in size.  Left Posterior Descending Artery  Vessel is small in size.  First Left Posterolateral Branch  Vessel is small in size.  Second Left Posterolateral Branch  Vessel is small in size.  Right Coronary Artery  Vessel is small. Vessel is angiographically normal.  Intervention  Ost LAD to Prox LAD lesion  Angioplasty (Also treats lesions: Prox LAD)  Balloon angioplasty was performed using a BALLOON SAPPHIRE [place] 3.0X15.  Post-Intervention Lesion Assessment  There is a 50% residual stenosis post intervention.  Prox LAD lesion  Angioplasty (Also treats lesions: Ost LAD to Prox LAD)  Balloon angioplasty was performed using a BALLOON SAPPHIRE [place] 3.0X15.  Post-Intervention Lesion Assessment  There is a 50% residual stenosis post intervention.  Coronary Diagrams  Diagnostic Dominance: Left  Intervention       Impression:  Patient has critical multivessel coronary artery disease and presented with a large acute anterior ST segment elevation myocardial infarction.  He underwent successful primary balloon angioplasty with restoration of TIMI-3 flow in the culprit vessel left anterior descending coronary artery and currently remained stable and without ongoing chest pain with intra-aortic balloon pump in place.  He has critical coronary anatomy unfavorable for definitive PCI  and stenting.  I agree he would best be treated with surgical revascularization.   Plan:  I have discussed the nature of the patient's problem with the patient on the table in the cardiac Cath Lab.  The need for emergent surgical revascularization was explained.  Risks associated with surgery and expectations for his recovery have been discussed.  All questions have been answered.  The patient understands and accepts all potential associated risks of surgery including but not limited to risk of death, stroke or other neurologic complication, myocardial infarction, congestive heart failure, respiratory failure, renal failure, bleeding requiring blood transfusion and/or reexploration, aortic dissection or other major vascular complication, arrhythmia, heart block or bradycardia requiring permanent pacemaker, pneumonia, pleural effusion, wound infection, pulmonary embolus or other thromboembolic complication, chronic pain or other delayed complications related to median sternotomy, or the late recurrence of symptomatic ischemic heart disease and/or congestive heart failure.     I spent in excess of 60 minutes during the conduct of this hospital consultation and >50% of this time involved direct face-to-face encounter for counseling and/or coordination of the patient's care.    Valentina Gu. Roxy Manns, MD 10/17/2019 3:01 PM

## 2019-10-17 NOTE — Progress Notes (Addendum)
  Echocardiogram 2D Echocardiogram Transesophagael has been performed.  Cody Arellano 10/17/2019, 4:34 PM

## 2019-10-17 NOTE — Anesthesia Preprocedure Evaluation (Addendum)
Anesthesia Evaluation  Patient identified by MRN, date of birth, ID band Patient awake    Reviewed: Allergy & Precautions, NPO status , Patient's Chart, lab work & pertinent test resultsPreop documentation limited or incomplete due to emergent nature of procedure.  History of Anesthesia Complications Negative for: history of anesthetic complications  Airway Mallampati: I  TM Distance: >3 FB Neck ROM: Full    Dental  (+) Dental Advisory Given, Poor Dentition   Pulmonary shortness of breath, COPD, former smoker,  Interstitial lung disease   breath sounds clear to auscultation       Cardiovascular hypertension, Pt. on medications + CAD, + Past MI and + Cardiac Stents  + dysrhythmias Atrial Fibrillation  Rhythm:Regular Rate:Normal  Acute anterior MI with Cardiogenic shock: Patient currently on norepinphrine 5 with stable hemodynamics.  CI 2-2.3 with normal filling pressures.  IABP placed given relatively stable hemodynamics to protect during PCI of ostial ramus and LAD   12/20 ECHO:  EF 65-70% with normal RV.    Neuro/Psych  Headaches, Anxiety Depression    GI/Hepatic negative GI ROS, Neg liver ROS,   Endo/Other  lupus  Renal/GU negative Renal ROS     Musculoskeletal   Abdominal   Peds  Hematology cangrelor and heparin in cath lab   Anesthesia Other Findings   Reproductive/Obstetrics                            Anesthesia Physical Anesthesia Plan  ASA: IV and emergent  Anesthesia Plan: General   Post-op Pain Management:    Induction: Intravenous, Rapid sequence and Cricoid pressure planned  PONV Risk Score and Plan: 2 and Treatment may vary due to age or medical condition  Airway Management Planned: Oral ETT  Additional Equipment: Arterial line, PA Cath, TEE and Ultrasound Guidance Line Placement  Intra-op Plan:   Post-operative Plan: Post-operative  intubation/ventilation  Informed Consent: I have reviewed the patients History and Physical, chart, labs and discussed the procedure including the risks, benefits and alternatives for the proposed anesthesia with the patient or authorized representative who has indicated his/her understanding and acceptance.     Only emergency history available and Dental advisory given  Plan Discussed with: CRNA and Surgeon  Anesthesia Plan Comments:        Anesthesia Quick Evaluation

## 2019-10-17 NOTE — Anesthesia Procedure Notes (Signed)
Procedure Name: Intubation Date/Time: 10/17/2019 3:47 PM Performed by: Clearnce Sorrel, CRNA Pre-anesthesia Checklist: Patient identified, Emergency Drugs available, Suction available, Patient being monitored and Timeout performed Patient Re-evaluated:Patient Re-evaluated prior to induction Oxygen Delivery Method: Circle system utilized Preoxygenation: Pre-oxygenation with 100% oxygen Induction Type: IV induction, Rapid sequence and Cricoid Pressure applied Ventilation: Mask ventilation without difficulty Laryngoscope Size: Mac and 4 Grade View: Grade I Tube type: Oral Tube size: 8.0 mm Number of attempts: 1 Airway Equipment and Method: Stylet Placement Confirmation: ETT inserted through vocal cords under direct vision,  positive ETCO2 and breath sounds checked- equal and bilateral Secured at: 23 cm Tube secured with: Tape Dental Injury: Teeth and Oropharynx as per pre-operative assessment

## 2019-10-17 NOTE — Progress Notes (Addendum)
[facility] Shock Patient Intake Sheet  1. Complete this form for all MI patients presenting with Cardiogenic Shock. 2. This form must be completed by a Cath Lab Super-Tech or Interventionalist. 3. Once completed please EPIC message Philemon Kingdom J   1. Inclusion criteria:  Choose all that apply:  [x]  Symptoms of acute myocardial infarction with ECG and/biomarker evidence of S-T elevation myocardial infarction or non-S-T myocardial infarction.  [x]  Systolic blood pressure < 23mmHg at baseline OR use of inotopes or vasopressors to maintain SBP >35mmHg + LVEDP >=34mmHg  [x]  Evidence of Ileene [name] organ hypoperfusion  [x]  Patient undergoes PCI  2. Exclusion criteria:  Was there a reason to exclude patient from the Clinical Shock Protocol?     (if YES, check reason and rest of form will not need to be filled out)           []  Evidence of anoxic brain injury           []  Unwitnessed out of hospital cardiac arrest or any       cardiac arrest in which return of spontaneous circulation (ROSC) is not achieved in 30 minutes.           [x]  IABP placed prior to Impella           []  Patient already supported with an Impella           []  Septic, anaphylactic and hemorrhage causes of shock           []  Neurologic and Non-ischemic cause of shock/hypotension (pulmonary embolism, pneumothorax, myocarditis, tamponade, etc.)           []  Active bleeding for which mechanical circulatory   support is contraindicated           []  Recent major surgery for which mechanical circulatory support is contraindicated            []  Mechanical complications of AMI (acute ventricular septal defect (VSD) or acute papillary muscle rupture)           []  Known left ventricular thrombus for which mechanical circulatory support is contraindicated           []  Mechanical aortic prosthetic valve           []  Contraindication to intravenous systemic anticoagulation  [x]  Yes            []  No  3.  Was LVEDP  obtained before PCI and Impella?  []  Yes            [x]  No  If obtained pre PCI/Impella was it over 15 mm? []  Yes            []  No  4.  Was "Severe Shock" identified prior PCI? If YES, select which item was present to identify "severe shock")  []  SBP <61mm  []  High dose pressors  []  Shock with SBP 80-90 or low dose pressors only, but with with EF <30% in anterior STEMI or Prox LAD or Left Main  []  Shock with SBP 80-90 or only on low pressors but with EF <20% in interior or lateral MI or RCA or Circ as culpit lesion.  []  Impella placed   []  pre PCI                               []  post PCI  []  Was Right heart cath performed prior to leaving cath lab?                  []   YES        []  NO  []  Yes            [x]  No  5.  "Not Severe Shock" identified prior to PCI?  If NO, then patient is presumed to have had a "severe shock" and rest of questions do not need to be answered)  a. If YES was impella placed? [x]  Yes            []  No IABP placed after consultation with shock/advanced HF team  b. If Impella was placed post PCI, was the patient still in Shock before Impella was placed? [x]  Yes            []  No  c. Was Right heart cath performed before Impella placement? [x]  Yes            []  No  d. If YES, what were the following values pre-Impella placement? Cardiac Index: 2-2.2 Cardiac Power: Click or tap here to enter text.  e. If Impella placed, state why.  [x]  Patient deteriorated into "severe shock" post PCI as defined by criteria of the [facility].  []  CI or CPO Low post PCI by RCA  []  Other, (please briefly explain to the right ? If other, briefly explain here:

## 2019-10-17 NOTE — Progress Notes (Addendum)
64fr sheath aspirated and removed from right radial artery. TR band applied at 11cc.  No s+s of hematoma. Very slight ooze from either the suture site or where the radial artery was numbed up in the cathlab.   Site level 0 spo2 97% as measured in  right thumb.  Band applied at 11cc at 22:33:00

## 2019-10-17 NOTE — ED Triage Notes (Signed)
Pt arrives via EMS from home with 9/10 chest pain starting at 10 am when cutting grass. Hx of MI and states it feels similar. 324 ASA and 3 nitro given. Central pain that radiates to jaw.

## 2019-10-17 NOTE — Op Note (Addendum)
CARDIOTHORACIC SURGERY OPERATIVE NOTE  Date of Procedure: 10/17/2019  Preoperative Diagnosis:   Severe 3-vessel Coronary Artery Disease  Acute ST-segment Elevation Myocardial Infarction  Postoperative Diagnosis: Same  Procedure:    Emergency Coronary Artery Bypass Grafting x 2   Left Internal Mammary Artery to Distal Left Anterior Descending Coronary Artery  Saphenous Vein Graft to Ramus Intermediate Branch Coronary Artery  Endoscopic Vein Harvest from Right Thigh  Clipping of left atrial appendage (Atricure Pro245 left atrial clip)  Surgeon: Valentina Gu. 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  Patient's cardiac history dates back to 2008 when he presented with an acute anterior wall myocardial infarction.  He underwent PCI and stenting x2 and reportedly had follow-up PCI in 2009.  At the time he lived in [place] and details are not currently available.  More recently the patient has been followed by Dr. 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  He has been maintaining sinus rhythm ever since and is not on long-term anticoagulation.  Patient states that he was in his usual state of health until today when he developed sudden onset of chest pain  associated with shortness of breath after just having finished mowing his yard.  Chest pain became progressively severe over 15 minutes, ultimately prompting the patient and his family to summon EMS.  EKG revealed anterolateral ST segment elevation and the patient was brought directly to the Cath Lab following arrival in the emergency department.  Diagnostic cardiac catheterization performed by Dr. Saunders Revel revealed acute 100% occlusion of the proximal left anterior descending coronary artery at site of previous stent.  There was 90% proximal stenosis of a large ramus intermediate branch.  Intra-aortic balloon pump was placed and primary balloon angioplasty was performed restoring TIMI-3 flow in the left anterior descending coronary artery.  Attempts to proceed with definitive PCI and stenting of the left anterior descending coronary artery were aborted due to extremely unfavorable anatomy.  Right heart catheterization was performed and emergency cardiothoracic surgical consultation was requested.   DETAILS OF THE OPERATIVE PROCEDURE  Preparation:  The patient is brought to the operating room on the above mentioned date and central monitoring was established by the anesthesia team including placement of Swan-Ganz catheter and radial arterial line. The patient is placed in the supine position on the operating table.  Intravenous antibiotics are administered. General endotracheal anesthesia is induced uneventfully. A Foley catheter is placed.  Baseline transesophageal echocardiogram was performed.  Findings were notable for hypokinesis of the anterior wall and interventricular septum.  There was mild aortic insufficiency.  The patient's chest, abdomen, both groins, and both lower extremities are prepared and draped in a sterile manner. A time out procedure is performed.   Surgical Approach and Conduit Harvest:  A median sternotomy incision was performed and the left internal mammary artery is dissected from  the chest wall and  prepared for bypass grafting. The left internal mammary artery is notably good quality conduit. Simultaneously, the greater saphenous vein is obtained from the patient's right thigh using endoscopic vein harvest technique. Initially two small incisions were made in the left thigh but the vein appeared too small and poor quality.  The portion of saphenous vein utilized for grafting is notably good quality conduit. After removal of the saphenous vein, the small surgical incisions in the lower extremity are closed with absorbable suture. Following systemic heparinization, the left internal mammary artery was transected distally noted to have excellent flow.   Extracorporeal Cardiopulmonary Bypass and Myocardial Protection:  The pericardium is opened. There are diffuse adhesions throughout, completely obliterating the pericardial space.  Sharp dissection and electrocautery is utilized to divide the adhesions.  The ascending aorta is normal in appearance. The ascending aorta and the right atrium are cannulated for cardiopulmonary bypass.  Adequate heparinization is verified.     The entire pre-bypass portion of the operation was notable for stable hemodynamics.  Cardiopulmonary bypass was begun and further dissection of adhesions is performed.   A retrograde cardioplegia cannula is placed through the right atrium into the coronary sinus.  Distal target vessels are selected for coronary artery bypass grafting. A cardioplegia cannula is placed in the ascending aorta.  A temperature probe was placed in the interventricular septum.  The patient is allowed to cool passively to [facility] systemic temperature.  The aortic cross clamp is applied and cold blood cardioplegia is delivered initially in an antegrade fashion through the aortic root.   Supplemental cardioplegia is given retrograde through the coronary sinus catheter.  Iced saline slush is applied for topical hypothermia.  The initial  cardioplegic arrest is rapid with early diastolic arrest.  Repeat doses of cardioplegia are administered intermittently throughout the entire cross clamp portion of the operation through the aortic root,  through the coronary sinus catheter, and through subsequently placed vein grafts in order to maintain completely flat electrocardiogram and septal myocardial temperature below 15C.  Myocardial protection was felt to be excellent.   Clipping of the left atrial appendage:  The remainder of adhesions surrounding the heart are divided and the left atrial appendage is freed up from surrounding adhesions.  The left atrial appendage is obliterated using direct application of a left atrial clip (Atricure Pro245, size 45 mm).   Coronary Artery Bypass Grafting:   The ramus intermediate branch coronary artery was grafted using a reversed saphenous vein graft in an end-to-side fashion.  At the site of distal anastomosis the target vessel was intramyocardial but good quality and measured approximately 2.0 mm in diameter.  The distal left anterior coronary artery was grafted with the left internal mammary artery in an end-to-side fashion.  At the site of distal anastomosis the target vessel was good quality and measured approximately 2.2 mm in diameter.  The single proximal vein graft anastomosis was placed directly to the ascending aorta prior to removal of the aortic cross clamp.  The septal myocardial temperature rose rapidly after reperfusion of the left internal mammary artery graft.  The aortic cross clamp was removed after a total cross clamp time of 59 minutes.   Procedure Completion:  The single proximal and both distal coronary anastomoses were inspected for hemostasis and appropriate graft orientation. Epicardial pacing wires are fixed to the right ventricular outflow tract and to the right atrial appendage. The patient is rewarmed to 37C temperature. The patient is weaned and disconnected from  cardiopulmonary bypass.  The  patient's rhythm at separation from bypass was AV paced.  The patient was weaned from cardiopulmonary bypass without any inotropic support other than IABP counterpulsation. Total cardiopulmonary bypass time for the operation was 82 minutes.  Followup transesophageal echocardiogram performed after separation from bypass revealed hypokinesis of the anterior wall and septum which waxed and waned but ultimately appeared to improve.  Milrinone infusion was added.  The aortic and venous cannula were removed uneventfully. Protamine was administered to reverse the anticoagulation. The mediastinum and pleural space were inspected for hemostasis and irrigated with saline solution. The mediastinum and both pleural spaces were drained using 4 chest tubes placed through separate stab incisions inferiorly.  The soft tissues anterior to the aorta were reapproximated loosely. The sternum is closed with FiberTape cerclage. The soft tissues anterior to the sternum were closed in multiple layers and the skin is closed with a running subcuticular skin closure.  The post-bypass portion of the operation was notable for stable rhythm and hemodynamics.  No blood products were administered during the operation.   Disposition:  The patient tolerated the procedure well and is transported to the surgical intensive care in stable condition. There are no intraoperative complications. All sponge instrument and needle counts are verified correct at completion of the operation.    Valentina Gu. Roxy Manns MD 10/17/2019 7:56 PM

## 2019-10-17 NOTE — Transfer of Care (Signed)
Immediate Anesthesia Transfer of Care Note  Patient: Cody Arellano  Procedure(s) Performed: CORONARY ARTERY BYPASS GRAFTING (CABG) using LIMA to LAD; Endoscopic harvest right greater saphenous vein: SVG to RAMUS. (N/A Chest) Transesophageal Echocardiogram (Tee) Clipping Of Atrial Appendage using AtriCure PRO245 45 MM AtriClip. (N/A Chest) Endovein Harvest Of Greater Saphenous Vein (Right Leg Upper)  Patient Location: ICU  Anesthesia Type:General  Level of Consciousness: Patient remains intubated per anesthesia plan  Airway & Oxygen Therapy: Patient remains intubated per anesthesia plan and Patient placed on Ventilator (see vital sign flow sheet for setting)  Post-op Assessment: Report given to RN and Post -op Vital signs reviewed and stable  Post vital signs: Reviewed and stable  Last Vitals:  Vitals Value Taken Time  BP    Temp    Pulse    Resp    SpO2      Last Pain:  Vitals:   10/17/19 1458  TempSrc:   PainSc: 0-No pain         Complications: No apparent anesthesia complications

## 2019-10-17 NOTE — Progress Notes (Signed)
[facility] BRIEF SICU PROGRESS NOTE  Day of Surgery  S/P Procedure(s) (LRB): CORONARY ARTERY BYPASS GRAFTING (CABG) using LIMA to LAD; Endoscopic harvest right greater saphenous vein: SVG to RAMUS. (N/A) Transesophageal Echocardiogram (Tee) Clipping Of Atrial Appendage using AtriCure PRO245 45 MM AtriClip. (N/A) Endovein Harvest Of Greater Saphenous Vein (Right)   Sedated on vent AV paced w/ MAP currently 70-75 on low dose levophed Hypothermic w/ temp <35 O2 sats 100% Cardiac index 1.4-1.5 w/ PA pressures low Chest tube output low  Plan: Warming measures, volume resuscitation.  Otherwise routine early postop  Rexene Alberts, MD 10/17/2019 8:41 PM

## 2019-10-17 NOTE — ED Provider Notes (Signed)
[facility] EMERGENCY DEPARTMENT Provider Note   CSN: [id number] Arrival date & time: 10/17/19  1124     History No chief complaint on file.   Cody Arellano is a 74 y.o. male.  The history is provided by the patient, the EMS personnel and medical records. No language interpreter was used.  Chest Pain Pain location:  Substernal area and L chest Pain quality: crushing and pressure   Pain radiates to:  Neck and L jaw Pain severity:  Severe Onset quality:  Sudden Duration:  1 hour Timing:  Constant Progression:  Improving Chronicity:  Recurrent Relieved by:  Nitroglycerin Worsened by:  Exertion Ineffective treatments:  None tried Associated symptoms: diaphoresis, fatigue, nausea and shortness of breath   Associated symptoms: no abdominal pain, no altered mental status, no anxiety, no back pain, no cough, no fever, no headache, no lower extremity edema, no palpitations and no vomiting   Risk factors: coronary artery disease and male sex        Past Medical History:  Diagnosis Date  . Anxiety   . Chronic tension headache   . Colon polyps   . Coronary artery disease 2008/2009   MI with PCI x 2, then PCI x 1 in 2009  . Depression   . Dyslipidemia   . Dysrhythmia    atrial fibrillation  . Hyperlipidemia   . Hypertension   . Lupus [facility])    sees Dr Trudie Reed  . Lupus disease of the lung   . 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 Myocardial infarction ([place]) 2008  . Shortness of breath     Patient Active Problem List   Diagnosis Date Noted  . Paroxysmal atrial fibrillation ([place]) 01/20/2019  . Chronic tension headache   . Preventative health care 01/09/2018  . Memory loss 01/09/2018  . Advance directive discussed with patient 01/09/2018  . Hyperlipemia 01/17/2016  . 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 Personal history of colonic polyps 05/20/2013  . Episodic mood disorder ([place]) 05/13/2013  . Systemic lupus erythematosus ([place])  05/13/2013  . Colon polyps   . CAD (coronary artery disease) 04/09/2013  . 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 ILD (interstitial lung disease) ([place]) 02/04/2013    Past Surgical History:  Procedure Laterality Date  . CARDIAC CATHETERIZATION    . coronary stents  2009       Family History  Problem Relation Age of Onset  . Heart disease Mother   . Alcohol abuse Father   . Hyperlipidemia Sister   . Hypertension Sister   . Hyperlipidemia Brother   . Hypertension Brother   . Cancer Brother        ?lung cancer  . Stomach cancer Brother   . Diabetes Paternal Uncle   . Colon cancer Neg Hx   . Esophageal cancer Neg Hx   . Rectal cancer Neg Hx     Social History   Tobacco Use  . Smoking status: Former Smoker    Packs/day: 1.00    Years: 20.00    Pack years: 20.00    Types: Cigarettes    Quit date: 02/26/1993    Years since quitting: 26.6  . Smokeless tobacco: Never Used  . Tobacco comment: QUIT SMOKING 20 YEARS AGO  Substance Use Topics  . Alcohol use: No  . Drug use: No    Home Medications Prior to Admission medications   Medication Sig Start Date End Date Taking? Authorizing Provider  aspirin EC 81 MG tablet Take 1 tablet (81 mg total) by mouth daily. 08/10/14   Nahser, Wonda Cheng, MD  azaTHIOprine (IMURAN) 50 MG tablet Take 50 mg by mouth daily.  10/11/14   [provider]  hydroxychloroquine (PLAQUENIL) 200 MG tablet Take 200 mg by mouth at bedtime.    [provider]  ibuprofen (ADVIL,MOTRIN) 600 MG tablet Take 1 tablet (600 mg total) by mouth every 8 (eight) hours as needed. 11/03/14   Noemi Chapel, MD  OVER THE COUNTER MEDICATION Vitamin B 12 500 mcq. One tablet daily.    [provider]  rosuvastatin (CRESTOR) 10 MG tablet TAKE 1 TABLET(10 MG) BY MOUTH DAILY 04/09/19   Nahser, Wonda Cheng, MD  sertraline (ZOLOFT) 50 MG tablet Take 50 mg by mouth daily.    [provider]  tamsulosin (FLOMAX)  0.4 MG CAPS capsule Take 0.4 mg by mouth daily.  04/02/17   [provider]    Allergies    Patient has no known allergies.  Review of Systems   Review of Systems  Constitutional: Positive for diaphoresis and fatigue. Negative for chills and fever.  HENT: Negative for congestion.   Eyes: Negative for visual disturbance.  Respiratory: Positive for chest tightness and shortness of breath. Negative for cough and wheezing.   Cardiovascular: Positive for chest pain. Negative for palpitations and leg swelling.  Gastrointestinal: Positive for nausea. Negative for abdominal pain, constipation, diarrhea and vomiting.  Genitourinary: Negative for flank pain.  Musculoskeletal: Negative for back pain and neck pain.  Skin: Negative for rash and wound.  Neurological: Negative for light-headedness and headaches.  Psychiatric/Behavioral: Negative for agitation.  All other systems reviewed and are negative.   Physical Exam Updated Vital Signs BP 101/74   Pulse 69   Temp (!) 96.5 F (35.8 C) (Temporal)   Resp 17   Ht 5\' 10"  (1.778 m)   Wt 77.1 kg   SpO2 96%   BMI 24.39 kg/m   Physical Exam Vitals and nursing note reviewed.  Constitutional:      General: He is not in acute distress.    Appearance: He is well-developed. He is ill-appearing. He is not toxic-appearing or diaphoretic.  HENT:     Head: Normocephalic and atraumatic.     Nose: No congestion or rhinorrhea.     Mouth/Throat:     Mouth: Mucous membranes are moist.     Pharynx: No oropharyngeal exudate or posterior oropharyngeal erythema.  Eyes:     Extraocular Movements: Extraocular movements intact.     Conjunctiva/sclera: Conjunctivae normal.     Pupils: Pupils are equal, round, and reactive to light.  Cardiovascular:     Rate and Rhythm: Normal rate and regular rhythm.     Pulses: Normal pulses.     Heart sounds: Murmur present.  Pulmonary:     Effort: Pulmonary effort is normal. No respiratory distress.      Breath sounds: Normal breath sounds. No wheezing, rhonchi or rales.  Chest:     Chest wall: No tenderness.  Abdominal:     General: Abdomen is flat.     Palpations: Abdomen is soft.     Tenderness: There is no abdominal tenderness.  Musculoskeletal:        General: No tenderness.     Cervical back: Neck supple.     Right lower leg: No edema.     Left lower leg: No edema.  Skin:    General: Skin is warm.  Findings: No erythema.  Neurological:     General: No focal deficit present.     Mental Status: He is alert.     Sensory: No sensory deficit.     Motor: No weakness.  Psychiatric:        Mood and Affect: Mood normal.     ED Results / Procedures / Treatments   Labs (all labs ordered are listed, but only abnormal results are displayed) Labs Reviewed  COMPREHENSIVE METABOLIC PANEL - Abnormal; Notable for the following components:      Result Value   Chloride 112 (*)    CO2 20 (*)    Calcium 8.6 (*)    Albumin 3.2 (*)    GFR calc non Af Amer 60 (*)    All other components within normal limits  HEMOGLOBIN A1C - Abnormal; Notable for the following components:   Hgb A1c MFr Bld 5.7 (*)    All other components within normal limits  TROPONIN I (HIGH SENSITIVITY) - Abnormal; Notable for the following components:   Troponin I (High Sensitivity) 28 (*)    All other components within normal limits  SARS CORONAVIRUS 2 BY RT PCR (HOSPITAL ORDER, [place] [facility])  LIPID PANEL  CBC  PROTIME-INR  APTT  TYPE AND SCREEN  PREPARE RBC (CROSSMATCH)  ABO/RH  TROPONIN I (HIGH SENSITIVITY)    EKG EKG Interpretation  Date/Time:  Saturday Oct 17 2019 11:27:48 EDT Ventricular Rate:  71 PR Interval:    QRS Duration: 142 QT Interval:  459 QTC Calculation: 499 R Axis:   -110 Text Interpretation: Sinus rhythm Right bundle branch block Anterolateral infarct, acute (LAD) Baseline wander in lead(s) V1 V3 V5 >>> Acute MI <<< When compared to prior, now STEMI  Confirmed by Antony Blackbird [id number] on 10/17/2019 11:36:55 AM   Radiology CARDIAC CATHETERIZATION  Result Date: 10/17/2019 Conclusions: 1. Severe multivessel coronary artery involving left-dominant system, with 100% thrombotic occlusion of the ostial/proximal LAD, involving previously stented segment, 90% ostial/proximal ramus intermedius in-stent restenosis, and 50% ostial/proximal LCx disease. 2. Upper normal to mildly elevated left and right heart filling pressures. 3. Mildly reduced Fick cardiac output/index. 4. Severely reduced left ventricular systolic function with LVEF 25-30% and mid/apical anterior and inferior as well as apical hypokinesis/akinesis. 5. Successful IVUS-guided PTCA to ostial/proximal LAD with reduction in stenosis from 100% ->50% and restoration of TIMI-3 flow. 6. Successful placement of 50 mL intraaortic balloon pump via the right common femoral artery. Recommendations: 1. Given difficult anatomy of the LMCA trifurcation, including protrusion of stent struts from the ramus into distal LMCA preventing intervention on this vessel and potential for acute closure of the the ramus and/or dominant LCx with stenting of the ostial LAD, decision was made to proceed with emergent CABG following discussion with cardiac surgery, shock/advanced HF, and interventional cardiology teams. 2. Discontinue cangrelor on transport to OR. 3. Dual antiplatelet therapy with aspirin and clopidogrel for at least 12 months when felt to be appropriate by cardiac surgery. 4. Aggressive secondary prevention. 5. Optimize evidence-based heart failure therapy post-CABG. Nelva Bush, MD [facility]    Procedures Procedures (including critical care time)  CRITICAL CARE Performed by: Gwenyth Allegra Blayton Huttner Total critical care time: 20 minutes Critical care time was exclusive of separately billable procedures and treating other patients. Critical care was necessary to treat or prevent imminent or  life-threatening deterioration. Critical care was time spent personally by me on the following activities: development of treatment plan with patient and/or surrogate as  well as nursing, discussions with consultants, evaluation of patient's response to treatment, examination of patient, obtaining history from patient or surrogate, ordering and performing treatments and interventions, ordering and review of laboratory studies, ordering and review of radiographic studies, pulse oximetry and re-evaluation of patient's condition.  Medications Ordered in ED Medications  EPINEPHrine (ADRENALIN) 4 mg in NS 250 mL (0.016 mg/mL) premix infusion (has no administration in time range)  DOPamine (INTROPIN) 800 mg in dextrose 5 % 250 mL (3.2 mg/mL) infusion (has no administration in time range)  milrinone (PRIMACOR) 20 MG/100 ML (0.2 mg/mL) infusion (has no administration in time range)  nitroGLYCERIN 50 mg in dextrose 5 % 250 mL (0.2 mg/mL) infusion (has no administration in time range)  phenylephrine (NEOSYNEPHRINE) 20-0.9 MG/250ML-% infusion (has no administration in time range)  magnesium sulfate (IV Push/IM) injection 40 mEq (has no administration in time range)  potassium chloride injection 80 mEq (has no administration in time range)  heparin 30,000 units/NS 1000 mL solution for CELLSAVER (has no administration in time range)  heparin sodium (porcine) 2,500 Units, papaverine 30 mg in electrolyte-148 (PLASMALYTE-148) 500 mL irrigation (has no administration in time range)  tranexamic acid (CYKLOKAPRON) pump prime solution 154 mg (has no administration in time range)  vancomycin (VANCOREADY) IVPB 1250 mg/250 mL (1,250 mg Intravenous Given 10/17/19 1558)  cefUROXime (ZINACEF) 750 mg in sodium chloride 0.9 % 100 mL IVPB (has no administration in time range)  vancomycin (VANCOCIN) 1,000 mg in sodium chloride 0.9 % 1,000 mL irrigation (has no administration in time range)  cangrelor (KENGREAL) 50,000 mcg in  sodium chloride 0.9 % 250 mL (200 mcg/mL) infusion (4 mcg/kg/min  77.1 kg Intravenous New Bag/Given 10/17/19 1445)  norepinephrine (LEVOPHED) 4 mg in dextrose 5 % 250 mL (0.016 mg/mL) infusion (5 mcg/min Intravenous New Bag/Given 10/17/19 1230)  0.9 %  sodium chloride infusion (50 mL/hr Intravenous New Bag/Given 10/17/19 1200)  0.9 %  sodium chloride infusion (10 mL/hr Intravenous New Bag/Given 10/17/19 1334)  dexmedetomidine (PRECEDEX) 400 MCG/100ML (4 mcg/mL) infusion (0.3 mcg/kg/hr  77.1 kg Intravenous New Bag/Given 10/17/19 1600)  insulin regular, human (MYXREDLIN) 100 units/ 100 mL infusion (1 Units/hr Intravenous New Bag/Given 10/17/19 1600)  tranexamic acid (CYKLOKAPRON) bolus via infusion - over 30 minutes 1,156.5 mg (1,156.5 mg Intravenous Canceled Entry 10/17/19 1617)  tranexamic acid (CYKLOKAPRON) 2,500 mg in sodium chloride 0.9 % 250 mL (10 mg/mL) infusion (1.5 mg/kg/hr  77.1 kg Intravenous New Bag/Given 10/17/19 1631)  cefUROXime (ZINACEF) 1.5 g in sodium chloride 0.9 % 100 mL IVPB (1.5 g Intravenous New Bag/Given 10/17/19 1558)  norepinephrine (LEVOPHED) 4mg  in 250mL premix infusion (0 mcg/min Intravenous Stopped 10/17/19 1531)    ED Course  I have reviewed the triage vital signs and the nursing notes.  Pertinent labs & imaging results that were available during my care of the patient were reviewed by me and considered in my medical decision making (see chart for details).    MDM Rules/Calculators/A&P                      Truen Schaul is a 74 y.o. male with a past medical history significant for CAD status post PCI, interstitial lung disease, lupus, hyperlipidemia, and paroxysmal atrial fibrillation who presents as a code STEMI for chest pain.  According to EMS and the patient, patient was mowing his grass this morning approximately 1 hour prior to arrival when he started crushing chest pain.  He reports that it radiates to his jaw and  associated with nausea, diaphoresis, and shortness of  breath and fatigue.  This is the "exact same" as his prior heart attack.  He reports no preceding symptoms before this.  He was given aspirin nitroglycerin with EMS with improvement in his pain.  He describes the pain as a 4 out of 10 at this time.  He denies recent fevers, chills, congestion, or cough.  Denies recent Covid exposures.  Denies emesis, constipation, diarrhea, or urinary symptoms.  On exam, patient's lungs are clear and chest is nontender.  I cannot reproduce his discomfort.  Abdomen is nontender.  Good pulses in extremities.  Is no longer diaphoretic.  EKG was not collected and does show STEMI.  Cardiology came to the bedside and agree and will take him to the Cath Lab.  Covid test was collected prior to going to the Cath Lab.  Patient will be admitted by cardiology after intervention and management.      Final Clinical Impression(s) / ED Diagnoses Final diagnoses:  ST elevation myocardial infarction (STEMI), unspecified artery (HCC)    Clinical Impression: 1. ST elevation myocardial infarction (STEMI), unspecified artery ([place])     Disposition: Admit  This note was prepared with assistance of Dragon voice recognition software. Occasional wrong-word or sound-a-like substitutions may have occurred due to the inherent limitations of voice recognition software.       Susy Placzek, Gwenyth Allegra, MD 10/17/19 [phone]

## 2019-10-17 NOTE — H&P (Signed)
Cardiology Admission History and Physical:   Patient ID: Cody Arellano MRN: [id number]; DOB: 1946/03/01   Admission date: 10/17/2019  Primary Care Provider: Venia Carbon, MD Primary Cardiologist: Mertie Moores, MD  Primary Electrophysiologist:  None   Chief Complaint:  Chest pain  Patient Profile:   Cody Arellano is a 74 y.o. male with history of CAD s/p PCI to LAD and ramus in 2008/2009, lupus with remote lupus pericarditis, paroxysmal atrial fibrillation, and hyperlipidemia, presenting with acute onset of chest pain and anterolateral ST elevation.  History of Present Illness:   Cody Arellano was in his usual state of health until around 10:30 AM, when he had acute onset of severe substernal chest pain while mowing his law.  EMS was summoned and found him to have anterolateral ST elevation.  Pain improved from 10/10 to 5/10 with ASA and sublingual NTG.  In the ED, he continued to have chest pain accompanied by shortness of breath.  He was referred for emergent cardiac catheterization.   Past Medical History:  Diagnosis Date  . Anxiety   . Chronic tension headache   . Colon polyps   . Coronary artery disease 2008/2009   MI with PCI x 2, then PCI x 1 in 2009  . Depression   . Dyslipidemia   . Dysrhythmia    atrial fibrillation  . Hyperlipidemia   . Hypertension   . Lupus [facility])    sees Dr Trudie Reed  . Lupus disease of the lung   . 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 Myocardial infarction ([place]) 2008  . Shortness of breath     Past Surgical History:  Procedure Laterality Date  . CARDIAC CATHETERIZATION    . coronary stents  2009     Medications Prior to Admission: Prior to Admission medications   Medication Sig Start Date Franny [place] Date Taking? Authorizing Provider  aspirin EC 81 MG tablet Take 1 tablet (81 mg total) by mouth daily. 08/10/14   Nahser, Wonda Cheng, MD  azaTHIOprine (IMURAN) 50 MG tablet Take 50 mg by mouth daily.  10/11/14   [provider]  hydroxychloroquine (PLAQUENIL)  200 MG tablet Take 200 mg by mouth at bedtime.    [provider]  ibuprofen (ADVIL,MOTRIN) 600 MG tablet Take 1 tablet (600 mg total) by mouth every 8 (eight) hours as needed. 11/03/14   Noemi Chapel, MD  OVER THE COUNTER MEDICATION Vitamin B 12 500 mcq. One tablet daily.    [provider]  rosuvastatin (CRESTOR) 10 MG tablet TAKE 1 TABLET(10 MG) BY MOUTH DAILY 04/09/19   Nahser, Wonda Cheng, MD  sertraline (ZOLOFT) 50 MG tablet Take 50 mg by mouth daily.    [provider]  tamsulosin (FLOMAX) 0.4 MG CAPS capsule Take 0.4 mg by mouth daily.  04/02/17   [provider]     Allergies:   No Known Allergies  Social History:   Social History   Tobacco Use  . Smoking status: Former Smoker    Packs/day: 1.00    Years: 20.00    Pack years: 20.00    Types: Cigarettes    Quit date: 02/26/1993    Years since quitting: 26.6  . Smokeless tobacco: Never Used  . Tobacco comment: QUIT SMOKING 20 YEARS AGO  Substance Use Topics  . Alcohol use: No  . Drug use: No     Family History:   The patient's family history includes Alcohol abuse in his father; Cancer in his brother; Diabetes in his paternal uncle; Heart  disease in his mother; Hyperlipidemia in his brother and sister; Hypertension in his brother and sister; Stomach cancer in his brother. There is no history of Colon cancer, Esophageal cancer, or Rectal cancer.    ROS:  Review of Systems  Unable to perform ROS: Acuity of condition    Physical Exam/Data:   Vitals:   10/17/19 1128 10/17/19 1129 10/17/19 1156 10/17/19 1321  BP:  101/74  (!) 113/55  Pulse:  69    Resp:  17    Temp:  (!) 96.5 F (35.8 C)    TempSrc:  Temporal    SpO2:  96% 98%   Weight: 77.1 kg     Height: 5\' 10"  (1.778 m)      No intake or output data in the 24 hours ending 10/17/19 1449 Last 3 Weights 10/17/2019 03/13/2019 01/20/2019  Weight (lbs) 170 lb 165 lb 165 lb  Weight (kg) 77.111 kg 74.844 kg 74.844 kg     Body mass index  is 24.39 kg/m.  General:  Uncomfortable appearing man lying on stretcher. HEENT: normal Lymph: no adenopathy Neck: no JVD Endocrine:  No thryomegaly Vascular: No carotid bruits; 2+ radial and femoral pulses. Cardiac:  RRR w/o murmurs, rubs, or gallops. Lungs:  Clear anteriorly. Abd: soft, nontender, no hepatomegaly  Ext: no edema Musculoskeletal:  No deformities, BUE and BLE strength normal and equal Skin: warm and dry  Neuro:  CNs 2-12 intact, no focal abnormalities noted Psych:  Normal affect   EKG:  The ECG that was done 11:36 was personally reviewed and demonstrates NSR with RBBB and anterolateral ST elevation.  Relevant CV Studies: LHC/RHC (10/17/2019): Left dominant system with 100% ostial LAD occlusion, 90% proximal ramus ISR, and 50% proximal LCx disease.  Mildly elevated left and right heart filling pressures.  Mildly reduced cardiac output.  Successful PTCA to ostial through proximal LAD with restoration of TIMI-3 flow.  Laboratory Data:  High Sensitivity Troponin:   Recent Labs  Lab 10/17/19 1155  TROPONINIHS 28*      Chemistry Recent Labs  Lab 10/17/19 1155  NA 142  K 4.7  CL 112*  CO2 20*  GLUCOSE 91  BUN 10  CREATININE 1.19  CALCIUM 8.6*  GFRNONAA 60*  GFRAA >60  ANIONGAP 10    Recent Labs  Lab 10/17/19 1155  PROT 6.5  ALBUMIN 3.2*  AST 29  ALT 12  ALKPHOS 71  BILITOT 1.2   Hematology Recent Labs  Lab 10/17/19 1205  WBC 6.5  RBC 4.46  HGB 14.2  HCT 43.2  MCV 96.9  MCH 31.8  MCHC 32.9  RDW 12.8  PLT 198   BNPNo results for input(s): BNP, PROBNP in the last 168 hours.  DDimer No results for input(s): DDIMER in the last 168 hours.   Radiology/Studies:  No results found.  TIMI Risk Score for ST  Elevation MI:   The patient's TIMI risk score is 7, which indicates a 23.4% risk of all cause mortality at 30 days.    Assessment and Plan:   Anterolateral STEMI: Patient presenting with acute CP and anterolateral ST elevation,  corresponding to ostial LAD occlusion.  Successful angioplasty performed but given lesion involving trifurcation of distal LMCA and severe ISR of ramus stent that could not be wired to facilitate angioplasty, cardiac surgery consultation and heart failure/interventional cardiology consultations were obtained and decision was made that CABG would provide the safest revascularization option.  The patient has been seen by Dr. Roxy Manns with plans for emergent CABG  today.  We will continue IABP and cangrelor pending transfer to OR.  Cardiogenic shock: Patient initially normotensive but progressively hypotensive during intervention requiring addition of low-dose norepinephrine.  Left and right heart cath shows mildly elevated left and right heart filling pressures in the setting of severely reduced LVEF (~20-25% by LV-gram).  IABP in place; continue low-dose norepinephrine.  Appreciate assistance of advanced HF team.  HLD: Escalate rosuvastatin to 80 mg daily.  SLE: No active symptoms.  Defer reinitiation of hydroxychloroquine/azathioprine to cardiac surgery team post-oop.  Severity of Illness: The appropriate patient status for this patient is INPATIENT. Inpatient status is judged to be reasonable and necessary in order to provide the required intensity of service to ensure the patient's safety. The patient's presenting symptoms, physical exam findings, and initial radiographic and laboratory data in the context of their chronic comorbidities is felt to place them at high risk for further clinical deterioration. Furthermore, it is not anticipated that the patient will be medically stable for discharge from the hospital within 2 midnights of admission. The following factors support the patient status of inpatient.   " The patient's presenting symptoms include severe chest pain. " The worrisome physical exam findings include progressive hypotension. " The initial radiographic and laboratory data are worrisome  because of marked anterolateral ST elevation. " The chronic co-morbidities include known CAD and lupus.  * I certify that at the point of admission it is my clinical judgment that the patient will require inpatient hospital care spanning beyond 2 midnights from the point of admission due to high intensity of service, high risk for further deterioration and high frequency of surveillance required.*    For questions or updates, please contact [place] Please consult [email or web address] for contact info under   Signed, Nelva Bush, MD  10/17/2019 2:49 PM

## 2019-10-17 NOTE — Procedures (Signed)
Arterial Catheter Insertion Procedure Note Cody Arellano [id number] 1945/07/31  Procedure: Insertion of Arterial Catheter  Indications: Blood pressure monitoring  Procedure Details Consent: Risks of procedure as well as the alternatives and risks of each were explained to the (patient/caregiver).  Consent for procedure obtained. and Unable to obtain consent because of altered level of consciousness. Time Out: Verified patient identification, verified procedure, site/side was marked, verified correct patient position, special equipment/implants available, medications/allergies/relevent history reviewed, required imaging and test results available.  Performed  Maximum sterile technique was used including antiseptics, cap, gloves, gown, hand hygiene, mask and sheet. Skin prep: Chlorhexidine; local anesthetic administered 20 gauge catheter was inserted into left radial artery using the Seldinger technique. ULTRASOUND GUIDANCE USED: NO Evaluation Blood flow good; BP tracing good. Complications: No apparent complications.   [name] 10/17/2019

## 2019-10-17 NOTE — Anesthesia Procedure Notes (Deleted)
Arterial Line Insertion Start/End5/15/2021 3:30 PM, 10/17/2019 3:45 PM Performed by: Clearnce Sorrel, CRNA  Preanesthetic checklist: patient identified, IV checked, risks and benefits discussed, surgical consent, monitors and equipment checked and pre-op evaluation Emergency situation radial was placed Catheter size: 20 G  Attempts: 1 Procedure performed without using ultrasound guided technique. Ultrasound Notes:anatomy identified, needle tip was noted to be adjacent to the nerve/plexus identified and no ultrasound evidence of intravascular and/or intraneural injection Following insertion, dressing applied and Biopatch. Patient tolerated the procedure well with no immediate complications.

## 2019-10-17 NOTE — Anesthesia Procedure Notes (Signed)
Central Venous Catheter Insertion Performed by: Annye Asa, MD, anesthesiologist Start/End5/15/2021 3:19 PM, 10/17/2019 3:36 PM Patient location: OR. Preanesthetic checklist: patient identified, IV checked, risks and benefits discussed, surgical consent, monitors and equipment checked, pre-op evaluation, timeout performed and anesthesia consent Position: supine Lidocaine 1% used for infiltration and patient sedated Hand hygiene performed , maximum sterile barriers used  and Seldinger technique used Catheter size: 8.5 Fr PA cath was placed.Sheath introducer Swan type:thermodilution Procedure performed using ultrasound guided technique. Ultrasound Notes:anatomy identified, needle tip was noted to be adjacent to the nerve/plexus identified, no ultrasound evidence of intravascular and/or intraneural injection and image(s) printed for medical record Attempts: 1 Following insertion, line sutured and dressing applied. Post procedure assessment: blood return through all ports, free fluid flow and no air  Patient tolerated the procedure well with no immediate complications. Additional procedure comments: PA catheter:  Routine monitors. Timeout, sterile prep, drape, FBP R neck.  Supine position.  1% [place] local, finder and trocar RIJ 1st pass with US guidance.  Cordis placed over J wire. PA catheter in easily.  Sterile dressing applied.  Patient tolerated well, VSS.  Jenita Seashore, MD.

## 2019-10-18 ENCOUNTER — Other Ambulatory Visit: Payer: Self-pay

## 2019-10-18 ENCOUNTER — Encounter (HOSPITAL_COMMUNITY): Payer: Self-pay | Admitting: Thoracic Surgery (Cardiothoracic Vascular Surgery)

## 2019-10-18 ENCOUNTER — Inpatient Hospital Stay (HOSPITAL_COMMUNITY): Payer: Medicare Other

## 2019-10-18 DIAGNOSIS — Z951 Presence of aortocoronary bypass graft: Secondary | ICD-10-CM

## 2019-10-18 LAB — BASIC METABOLIC PANEL
Anion gap: 6 (ref 5–15)
Anion gap: 10 (ref 5–15)
BUN: 9 mg/dL (ref 8–23)
BUN: 11 mg/dL (ref 8–23)
CO2: 20 mmol/L — ABNORMAL LOW (ref 22–32)
CO2: 19 mmol/L — ABNORMAL LOW (ref 22–32)
Calcium: 7.5 mg/dL — ABNORMAL LOW (ref 8.9–10.3)
Calcium: 7.7 mg/dL — ABNORMAL LOW (ref 8.9–10.3)
Chloride: 114 mmol/L — ABNORMAL HIGH (ref 98–111)
Chloride: 110 mmol/L (ref 98–111)
Creatinine, Ser: 0.91 mg/dL (ref 0.61–1.24)
Creatinine, Ser: 1.06 mg/dL (ref 0.61–1.24)
GFR calc Af Amer: >60 mL/min (ref >60)
GFR calc Af Amer: >60 mL/min (ref >60)
GFR calc non Af Amer: >60 mL/min (ref >60)
GFR calc non Af Amer: >60 mL/min (ref >60)
Glucose, Bld: 125 mg/dL — ABNORMAL HIGH (ref 70–99)
Glucose, Bld: 132 mg/dL — ABNORMAL HIGH (ref 70–99)
Potassium: 4 mmol/L (ref 3.5–5.1)
Potassium: 4.6 mmol/L (ref 3.5–5.1)
Sodium: 140 mmol/L (ref 135–145)
Sodium: 139 mmol/L (ref 135–145)

## 2019-10-18 LAB — POCT I-STAT 7, (LYTES, BLD GAS, ICA,H+H)
Acid-base deficit: 4 mmol/L — ABNORMAL HIGH (ref 0.0–2.0)
Acid-base deficit: 4 mmol/L — ABNORMAL HIGH (ref 0.0–2.0)
Bicarbonate: 21 mmol/L (ref 20.0–28.0)
Bicarbonate: 20.5 mmol/L (ref 20.0–28.0)
Calcium, Ion: 1.16 mmol/L (ref 1.15–1.40)
Calcium, Ion: 1.16 mmol/L (ref 1.15–1.40)
HCT: 28 % — ABNORMAL LOW (ref 39.0–52.0)
HCT: 30 % — ABNORMAL LOW (ref 39.0–52.0)
Hemoglobin: 9.5 g/dL — ABNORMAL LOW (ref 13.0–17.0)
Hemoglobin: 10.2 g/dL — ABNORMAL LOW (ref 13.0–17.0)
O2 Saturation: 97 %
O2 Saturation: 98 %
Potassium: 4 mmol/L (ref 3.5–5.1)
Potassium: 3.9 mmol/L (ref 3.5–5.1)
Sodium: 142 mmol/L (ref 135–145)
Sodium: 143 mmol/L (ref 135–145)
TCO2: 22 mmol/L (ref 22–32)
TCO2: 22 mmol/L (ref 22–32)
pCO2 arterial: 36.2 mmHg (ref 32.0–48.0)
pCO2 arterial: 35.4 mmHg (ref 32.0–48.0)
pH, Arterial: 7.372 (ref 7.350–7.450)
pH, Arterial: 7.371 (ref 7.350–7.450)
pO2, Arterial: 92 mmHg (ref 83.0–108.0)
pO2, Arterial: 113 mmHg — ABNORMAL HIGH (ref 83.0–108.0)

## 2019-10-18 LAB — CBC
HCT: 31.7 % — ABNORMAL LOW (ref 39.0–52.0)
Hemoglobin: 10.1 g/dL — ABNORMAL LOW (ref 13.0–17.0)
MCH: 32 pg (ref 26.0–34.0)
MCHC: 31.9 g/dL (ref 30.0–36.0)
MCV: 100.3 fL — ABNORMAL HIGH (ref 80.0–100.0)
Platelets: 120 10*3/uL — ABNORMAL LOW (ref 150–400)
RBC: 3.16 MIL/uL — ABNORMAL LOW (ref 4.22–5.81)
RDW: 13 % (ref 11.5–15.5)
WBC: 9.1 10*3/uL (ref 4.0–10.5)
nRBC: 0 % (ref 0.0–0.2)

## 2019-10-18 LAB — GLUCOSE, CAPILLARY
Glucose-Capillary: 104 mg/dL — ABNORMAL HIGH (ref 70–99)
Glucose-Capillary: 113 mg/dL — ABNORMAL HIGH (ref 70–99)
Glucose-Capillary: 113 mg/dL — ABNORMAL HIGH (ref 70–99)
Glucose-Capillary: 119 mg/dL — ABNORMAL HIGH (ref 70–99)
Glucose-Capillary: 120 mg/dL — ABNORMAL HIGH (ref 70–99)
Glucose-Capillary: 122 mg/dL — ABNORMAL HIGH (ref 70–99)
Glucose-Capillary: 124 mg/dL — ABNORMAL HIGH (ref 70–99)
Glucose-Capillary: 124 mg/dL — ABNORMAL HIGH (ref 70–99)
Glucose-Capillary: 127 mg/dL — ABNORMAL HIGH (ref 70–99)
Glucose-Capillary: 133 mg/dL — ABNORMAL HIGH (ref 70–99)

## 2019-10-18 LAB — MRSA PCR SCREENING: MRSA by PCR: NEGATIVE

## 2019-10-18 LAB — MAGNESIUM
Magnesium: 2.5 mg/dL — ABNORMAL HIGH (ref 1.7–2.4)
Magnesium: 2.1 mg/dL (ref 1.7–2.4)

## 2019-10-18 LAB — COOXEMETRY PANEL
Carboxyhemoglobin: 1.3 % (ref 0.5–1.5)
Methemoglobin: 1.2 % (ref 0.0–1.5)
O2 Saturation: 64.9 %
Total hemoglobin: 10.4 g/dL — ABNORMAL LOW (ref 12.0–16.0)

## 2019-10-18 MED ORDER — FUROSEMIDE 10 MG/ML IJ SOLN
40.0000 mg | Freq: Two times a day (BID) | INTRAMUSCULAR | Status: DC
  Administered 2019-10-18 (×2): 40 mg via INTRAVENOUS
  Filled 2019-10-18 (×3): qty 4

## 2019-10-18 MED ORDER — HYDROXYCHLOROQUINE SULFATE 200 MG PO TABS
200.0000 mg | ORAL_TABLET | Freq: Every day | ORAL | Status: DC
  Administered 2019-10-18 – 2019-10-26 (×9): 200 mg via ORAL
  Filled 2019-10-18 (×9): qty 1

## 2019-10-18 MED ORDER — TAMSULOSIN HCL 0.4 MG PO CAPS: 0.4000 mg | ORAL_CAPSULE | Freq: Every day | ORAL | Status: DC

## 2019-10-18 MED ORDER — NOREPINEPHRINE 4 MG/250ML-% IV SOLN
0.0000 ug/min | INTRAVENOUS | Status: AC
  Administered 2019-10-18: 15 ug/min via INTRAVENOUS
  Filled 2019-10-18: qty 250

## 2019-10-18 MED ORDER — AZATHIOPRINE 50 MG PO TABS
50.0000 mg | ORAL_TABLET | Freq: Every day | ORAL | Status: DC
  Administered 2019-10-18 – 2019-10-27 (×10): 50 mg via ORAL
  Filled 2019-10-18 (×10): qty 1

## 2019-10-18 MED ORDER — SERTRALINE HCL 50 MG PO TABS
50.0000 mg | ORAL_TABLET | Freq: Every day | ORAL | Status: DC
  Administered 2019-10-21 – 2019-10-26 (×6): 50 mg via ORAL
  Filled 2019-10-18 (×7): qty 1

## 2019-10-18 MED ORDER — ENOXAPARIN SODIUM 30 MG/0.3ML ~~LOC~~ SOLN
30.0000 mg | SUBCUTANEOUS | Status: DC
  Administered 2019-10-19: 30 mg via SUBCUTANEOUS
  Filled 2019-10-18: qty 0.3

## 2019-10-18 MED ORDER — NOREPINEPHRINE 16 MG/250ML-% IV SOLN
0.0000 ug/min | INTRAVENOUS | Status: DC
  Administered 2019-10-18: 14 ug/min via INTRAVENOUS
  Filled 2019-10-18: qty 250

## 2019-10-18 MED ORDER — MILRINONE LACTATE IN DEXTROSE 20-5 MG/100ML-% IV SOLN
0.1250 ug/kg/min | INTRAVENOUS | Status: DC
  Administered 2019-10-18 – 2019-10-20 (×4): 0.3 ug/kg/min via INTRAVENOUS
  Administered 2019-10-20 – 2019-10-21 (×2): 0.25 ug/kg/min via INTRAVENOUS
  Administered 2019-10-22: 0.125 ug/kg/min via INTRAVENOUS
  Filled 2019-10-18 (×7): qty 100

## 2019-10-18 MED ORDER — INSULIN ASPART 100 UNIT/ML ~~LOC~~ SOLN
0.0000 [IU] | SUBCUTANEOUS | Status: DC
  Administered 2019-10-18 (×2): 2 [IU] via SUBCUTANEOUS

## 2019-10-18 MED ORDER — POTASSIUM CHLORIDE CRYS ER 20 MEQ PO TBCR
40.0000 meq | EXTENDED_RELEASE_TABLET | Freq: Once | ORAL | Status: AC
  Administered 2019-10-18: 40 meq via ORAL
  Filled 2019-10-18: qty 2

## 2019-10-18 MED ORDER — ROSUVASTATIN CALCIUM 20 MG PO TABS
40.0000 mg | ORAL_TABLET | Freq: Every day | ORAL | Status: DC
  Administered 2019-10-20 – 2019-10-27 (×8): 40 mg via ORAL
  Filled 2019-10-18 (×8): qty 2

## 2019-10-18 NOTE — Plan of Care (Signed)
Pt is alert and oriented, on 4L [place], breathing is even and unlabored, lungs are clear and diminished. Pt is pacer dependent with a rate of 90. Neo and levo are running for pressure support. No bleeding noted, pt has had minimal urine output. PRN medication for pain, see MAR. Recent CI of 2, CO 3.9, and CVP of 6. No complaints or concerns voiced at this time. Pt was educated on IABP, importance of maintaining right leg straight, and all other lines. Pt verbalizes understanding. Call bell is within reach and bed is in lowest position. Problem: Clinical Measurements: Goal: Ability to maintain clinical measurements within normal limits will improve Outcome: Progressing Goal: Diagnostic test results will improve Outcome: Progressing   Problem: Coping: Goal: Level of anxiety will decrease Outcome: Progressing   Problem: Pain Managment: Goal: General experience of comfort will improve Outcome: Progressing   Problem: Cardiac: Goal: Will achieve and/or maintain hemodynamic stability Outcome: Progressing

## 2019-10-18 NOTE — Progress Notes (Addendum)
[address]       [place] [address]             [phone]        CARDIOTHORACIC SURGERY PROGRESS NOTE   R1 Day Post-Op Procedure(s) (LRB): CORONARY ARTERY BYPASS GRAFTING (CABG) using LIMA to LAD; Endoscopic harvest right greater saphenous vein: SVG to RAMUS. (N/A) Transesophageal Echocardiogram (Tee) Clipping Of Atrial Appendage using AtriCure [id number] 45 MM AtriClip. (N/A) Endovein Harvest Of Greater Saphenous Vein (Right)  Subjective: Looks okay.  Feels rough.  Objective: Vital signs: BP Readings from Last 1 Encounters:  10/18/19 98/71   Pulse Readings from Last 1 Encounters:  10/18/19 80   Resp Readings from Last 1 Encounters:  10/18/19 20   Temp Readings from Last 1 Encounters:  10/18/19 98.8 F (37.1 C)    Hemodynamics: PAP: (23-37)/(7-19) 31/14 CVP:  [5 mmHg-8 mmHg] 8 mmHg CO:  [2.9 L/min-4.8 L/min] 4.1 L/min CI:  [1.5 L/min/m2-2.5 L/min/m2] 2.1 L/min/m2  Mixed venous co-ox 65%   Physical Exam:  Rhythm:   Sinus brady - AAI paced  Breath sounds: clear  Heart sounds:  RRR  Incisions:  Dressing dry, intact  Abdomen:  Soft, non-distended, non-tender  Extremities:  Warm, well-perfused  Chest tubes:  low volume thin serosanguinous output, no air leak    Intake/Output from previous day: 05/15 0701 - 05/16 0700 In: 7033.7 [P.O.:80; I.V.:5658; Blood:212; NG/GT:60; IV Piggyback:1023.7] Out: 2439 [Urine:1260; Emesis/NG output:250; Blood:326; Chest Tube:603] Intake/Output this shift: Total I/O In: 290.2 [I.V.:290.2] Out: 190 [Urine:50; Chest Tube:140]  Lab Results:  CBC: Recent Labs    10/17/19 2034 10/18/19 0101 10/18/19 0224 10/18/19 0310  WBC 8.7  --   --  9.1  HGB 9.5*   < > 10.2* 10.1*  HCT 29.9*   < > 30.0* 31.7*  PLT 92*  --   --  120*   < > = values in this interval not displayed.    BMET:  Recent Labs    10/17/19 2219 10/18/19 0101 10/18/19 0224 10/18/19 0310  NA 138   < > 143 140  K 4.1   < > 3.9 4.0    CL 113*  --   --  114*  CO2 18*  --   --  20*  GLUCOSE 133*  --   --  125*  BUN 9  --   --  9  CREATININE 0.82  --   --  0.91  CALCIUM 7.1*  --   --  7.5*   < > = values in this interval not displayed.     PT/INR:   Recent Labs    10/17/19 2034  LABPROT 18.3*  INR 1.6*    CBG (last 3)  Recent Labs    10/18/19 0620 10/18/19 0657 10/18/19 0800  GLUCAP 127* 122* 124*    ABG    Component Value Date/Time   PHART 7.371 10/18/2019 0224   PCO2ART 35.4 10/18/2019 0224   PO2ART 113 (H) 10/18/2019 0224   HCO3 20.5 10/18/2019 0224   TCO2 22 10/18/2019 0224   ACIDBASEDEF 4.0 (H) 10/18/2019 0224   O2SAT 64.9 10/18/2019 0310    CXR: PORTABLE CHEST 1 VIEW  COMPARISON:  Chest x-rays dated 10/17/2019 and 11/03/2014.  FINDINGS: Endotracheal tube has been removed. Swan-Ganz catheter is stable in position with tip just to the LEFT of midline. Mediastinal drains remain in place. Bilateral chest tubes are stable in position.  Central pulmonary vascular congestion. Probable bibasilar atelectasis.  No pleural effusion or pneumothorax is seen.  IMPRESSION: 1. Central pulmonary vascular congestion suggesting mild volume overload, stable. 2. Probable bibasilar atelectasis. 3. Endotracheal tube has been removed. Support apparatus appears otherwise stable.   Electronically Signed   By: Franki Cabot M.D.   On: 10/18/2019 09:41   Assessment/Plan: S/P Procedure(s) (LRB): CORONARY ARTERY BYPASS GRAFTING (CABG) using LIMA to LAD; Endoscopic harvest right greater saphenous vein: SVG to RAMUS. (N/A) Transesophageal Echocardiogram (Tee) Clipping Of Atrial Appendage using AtriCure [id number] 45 MM AtriClip. (N/A) Endovein Harvest Of Greater Saphenous Vein (Right)  Overall doing fairly well s/p acute anterior STEMI s/p emergency CABG x2 Maintaining NSR - AAI paced rhythm w/ stable hemodynamics on milrinone 0.3 IABP 1:2 and low dose levophed + Neo for BP support - cardiac index >2  and co-ox [id number]  Acute systolic CHF - stable on current support Breathing comfortably w/ O2 sats 95% on 2 L/min via [place] - CXR w/ mild pulm vasc congestion Expected post op acute blood loss anemia, mild Expected post op atelectasis, mild Post op thrombocytopenia, mild Lupus Chronic interstitial lung disease   Wean levophed and Neo as tolerated  Continue IABP 1:1 today and tentatively plan to wean and d/c tomorrow  Continue low dose milrinone  Continue amiodarone for Afib prophylaxis  Restart plaquenil and Imuran for SLE   Rexene Alberts, MD 10/18/2019 9:43 AM

## 2019-10-18 NOTE — Progress Notes (Signed)
Pt foley catheter leaked, unsure of how much UOP unaccounted for. RN will continue to monitor.

## 2019-10-18 NOTE — Progress Notes (Signed)
[facility] BRIEF SICU PROGRESS NOTE  1 Day Post-Op  S/P Procedure(s) (LRB): CORONARY ARTERY BYPASS GRAFTING (CABG) using LIMA to LAD; Endoscopic harvest right greater saphenous vein: SVG to RAMUS. (N/A) Transesophageal Echocardiogram (Tee) Clipping Of Atrial Appendage using AtriCure [id number] 45 MM AtriClip. (N/A) Endovein Harvest Of Greater Saphenous Vein (Right)   Stable day NSR w/ stable hemodynamics off Neo and Levophed weaned to 3 mcg Breathing comfortably w/ O2 sats 97% on 4 L/min UOP adequate  Plan: Continue current plan.  Will wean IABP in the morning  Rexene Alberts, MD 10/18/2019 6:00 PM

## 2019-10-18 NOTE — Discharge Instructions (Signed)

## 2019-10-18 NOTE — Progress Notes (Signed)
Pt was extubated per rapid wean protocol. Pt is able to voice his name, gets up to 250 on IS with reinforcement. Pt has a weak cough, no secretions at this time. Lungs sound clear and bilateral throughout.

## 2019-10-18 NOTE — Hospital Course (Addendum)
Mr. Andre is 74 year old African-American male with history of coronary artery disease, hypertension, hyperlipidemia, paroxysmal atrial fibrillation and lupus who is referred for emergent management of acute ST segment elevation myocardial infarction.   Patient's cardiac history dates back to 2008 when he presented with an acute anterior wall myocardial infarction.  He underwent PCI and stenting x2 and reportedly had follow-up PCI in 2009.  At the time he lived in [place] and details are not currently available.  More recently the patient has been followed by Dr. 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  He has been maintaining sinus rhythm ever since and is not on long-term anticoagulation.   Patient states that he was in his usual state of health until today when he developed sudden onset of chest pain associated with shortness of breath after just having finished mowing his yard.  Chest pain became progressively severe over 15 minutes, ultimately prompting the patient and his family to summon EMS.  EKG revealed anterolateral ST segment elevation and the patient was brought directly to the Cath Lab following arrival in the emergency department.  Diagnostic cardiac catheterization performed by Dr. Saunders Revel revealed acute 100% occlusion of the proximal left anterior descending coronary artery at site of previous stent.  There was 90% proximal stenosis of a large ramus intermediate branch.  Intra-aortic balloon pump was placed and primary balloon angioplasty was performed restoring TIMI-3 flow in the left anterior descending coronary artery.  Attempts to proceed with definitive PCI and stenting of the left anterior descending coronary artery were aborted due to extremely unfavorable anatomy.  Right heart catheterization was performed and emergency cardiothoracic surgical consultation was requested.    Hospital course:   Mr. Ficke  was taken to the operating room in stable condition.  He underwent CABG x 2 utilizing LIMA to LAD, and SVG to Ramus Intermediate.  He underwent clipping of Left atrial appendage with a 45 mm Atricure Proclip 2, lysis of adhesions, and endoscopic harvest of greater saphenous vein from his right thigh.  He tolerated the procedure without difficulty and was taken to the SICU in stable condition.  The patient was weaned and extubated the evening of surgery.  He was weaned off Levophed, Neo-synephrine, and Milrinone drips as hemodynamics allowed.  His intra aortic balloon pump was weaned to 1:1 on POD #1.  This was able to be removed on POD #2.  He was on an Amiodarone drip for Atrial Fibrillation prophylaxis.  This was transitioned to an oral regimen prior to discharge.  His chest tubes and arterial lines were removed without difficulty.  He was restarted on Plaquenil and Imuran for his SLE.

## 2019-10-18 NOTE — Progress Notes (Signed)
Pt foley catheter continuing to leak, RN removed water from balloon and replace water 10cc in foley balloon x2. Unsure of how much UOP unaccounted for. RN will continue to monitor.

## 2019-10-18 NOTE — Progress Notes (Signed)
TR band removed from right radial sheath sight. It remains level 0, no bleeding noted, no hematoma palpated. Pt's arm is warm and dry. Pt is able to move his arm. Gauze and Tegaderm placed.

## 2019-10-18 NOTE — Procedures (Signed)
Extubation Procedure Note  Patient Details:   Name: Cody Arellano DOB: 08-13-1945 MRN: [id number]   Airway Documentation:    Vent end date: 10/18/19 Vent end time: 0120   Evaluation  O2 sats: stable throughout Complications: No apparent complications Patient did tolerate procedure well. Bilateral Breath Sounds: Clear, Diminished   Yes   Weaning mechanics dine prior to extubation NIF -22 VC 865ml-900ml Pt had positive cuff leak and able to speak after. Voice a little hoarse RN at bedside with IS.   Chikita Dogan F 10/18/2019, 1:26 AM

## 2019-10-18 NOTE — Progress Notes (Signed)
Patient ID: Cody Arellano, male   DOB: [birth year], 74 y.o.   MRN: [id number]     Advanced Heart Failure Rounding Note  PCP-Cardiologist: Mertie Moores, MD   Subjective:    Anterior STEMI at admission, LHC with occluded ostial LAD (in-stent), 90% ostial ramus, 60-70% proximal LCx, nondominant RCA.  PTCA to LAD and ramus to restore flow, then CABG with LIMA-LAD and SVG-ramus. Echo pre-op with EF 65-70%, normal RV function, bicuspid AoV with mild AS and mild AI.   IABP in place, 1:2 this morning.  He is on phenylephrine 60, NE 16, milrinone 0.3, amiodarone gtt 30.   Extubated and awake.   A-paced.   Swan:  CVP 12 PA 36/18 CI 2.1 Co-ox 65%   Objective:   Weight Range: 90 kg Body mass index is 28.47 kg/m.   Vital Signs:   Temp:  [93.7 F (34.3 C)-99 F (37.2 C)] 98.4 F (36.9 C) (05/16 0700) Pulse Rate:  [61-140] 89 (05/16 0700) Resp:  [10-28] 20 (05/16 0800) BP: (83-137)/(17-88) 112/50 (05/16 0700) SpO2:  [89 %-100 %] 96 % (05/16 0700) Arterial Line BP: (87-162)/(17-51) 116/51 (05/16 0700) FiO2 (%):  [40 %-50 %] 40 % (05/16 0026) Weight:  [77.1 kg-90 kg] 90 kg (05/16 0500)    Weight change: Filed Weights   10/17/19 1128 10/18/19 0500  Weight: 77.1 kg 90 kg    Intake/Output:   Intake/Output Summary (Last 24 hours) at 10/18/2019 0811 Last data filed at 10/18/2019 0800 Gross per 24 hour  Intake 7206.88 ml  Output 2439 ml  Net 4767.88 ml      Physical Exam    General:  Well appearing. No resp difficulty HEENT: Normal Neck: Supple. JVP 12-14. Carotids 2+ bilat; no bruits. No lymphadenopathy or thyromegaly appreciated. Cor: PMI nondisplaced. Regular rate & rhythm. IABP sounds. No rubs, gallops or murmurs. Lungs: Decreased at bases.  Abdomen: Soft, nontender, nondistended. No hepatosplenomegaly. No bruits or masses. Good bowel sounds. Extremities: No cyanosis, clubbing, rash, edema Neuro: Alert & orientedx3, cranial nerves grossly intact. moves all 4 extremities  w/o difficulty. Affect pleasant   Telemetry   A-paced 80s (personally reviewed)  Labs    CBC Recent Labs    10/17/19 2034 10/18/19 0101 10/18/19 0224 10/18/19 0310  WBC 8.7  --   --  9.1  HGB 9.5*   < > 10.2* 10.1*  HCT 29.9*   < > 30.0* 31.7*  MCV 100.0  --   --  100.3*  PLT 92*  --   --  120*   < > = values in this interval not displayed.   Basic Metabolic Panel Recent Labs    10/17/19 2219 10/18/19 0101 10/18/19 0224 10/18/19 0310  NA 138   < > 143 140  K 4.1   < > 3.9 4.0  CL 113*  --   --  114*  CO2 18*  --   --  20*  GLUCOSE 133*  --   --  125*  BUN 9  --   --  9  CREATININE 0.82  --   --  0.91  CALCIUM 7.1*  --   --  7.5*  MG  --   --   --  2.5*   < > = values in this interval not displayed.   Liver Function Tests Recent Labs    10/17/19 1155  AST 29  ALT 12  ALKPHOS 71  BILITOT 1.2  PROT 6.5  ALBUMIN 3.2*   No results for input(s): LIPASE,  AMYLASE in the last 72 hours. Cardiac Enzymes No results for input(s): CKTOTAL, CKMB, CKMBINDEX, TROPONINI in the last 72 hours.  BNP: BNP (last 3 results) No results for input(s): BNP in the last 8760 hours.  ProBNP (last 3 results) No results for input(s): PROBNP in the last 8760 hours.   D-Dimer No results for input(s): DDIMER in the last 72 hours. Hemoglobin A1C Recent Labs    10/17/19 1205  HGBA1C 5.7*   Fasting Lipid Panel Recent Labs    10/17/19 1155  CHOL 113  HDL 45  LDLCALC 58  TRIG 48  CHOLHDL 2.5   Thyroid Function Tests No results for input(s): TSH, T4TOTAL, T3FREE, THYROIDAB in the last 72 hours.  Invalid input(s): FREET3  Other results:   Imaging    CARDIAC CATHETERIZATION  Result Date: 10/17/2019 Conclusions: 1. 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 2. Upper normal to mildly elevated left and right heart filling pressures. 3. Mildly reduced Fick cardiac output/index. 4. Severely reduced left ventricular systolic function with LVEF 25-30% and mid/apical anterior and inferior as well as apical hypokinesis/akinesis. 5. Successful IVUS-guided PTCA to ostial/proximal LAD with reduction in stenosis from 100% ->50% and restoration of TIMI-3 flow. 6. Successful placement of 50 mL intraaortic balloon pump via the right common femoral artery. Recommendations: 1. Given difficult anatomy of the LMCA trifurcation, including protrusion of stent struts from the ramus into distal LMCA preventing intervention on this vessel and potential for acute closure of the the ramus and/or dominant LCx with stenting of the ostial LAD, decision was made to proceed with emergent CABG following discussion with cardiac surgery, shock/advanced HF, and interventional cardiology teams. 2. Discontinue cangrelor on transport to OR. 3. Dual antiplatelet therapy with aspirin and clopidogrel for at least 12 months when felt to be appropriate by cardiac surgery. 4. Aggressive secondary prevention. 5. Optimize evidence-based heart failure therapy post-CABG. Nelva Bush, MD [facility]   DG Chest Port 1 View  Result Date: 10/17/2019 CLINICAL DATA:  Atelectasis, post CABG. EXAM: PORTABLE CHEST 1 VIEW COMPARISON:  Remote radiograph 11/03/2014 FINDINGS: Endotracheal tube in place, carina is difficult to define but the tip appears low in positioning. Recommend retraction of 2-3 cm. Enteric tube in place with tip below the diaphragm not included in the field of view. Right internal jugular Swan-Ganz catheter tip in the region of the main pulmonary outflow tract. Bilateral chest tubes and mediastinal drains in place. There has been left atrial clipping. Mild cardiomegaly. Hazy bibasilar opacities may represent combination of atelectasis and pleural effusions. No visualized pneumothorax. No  pulmonary edema. IMPRESSION: 1. Endotracheal tube in place, carina difficult to define but the tip appears low in positioning. Recommend retraction of 2-3 cm. 2. Enteric tube, right Swan-Ganz catheter, bilateral chest tubes and mediastinal drains in place. 3. Hazy bibasilar opacities may represent combination of atelectasis and pleural effusions. Electronically Signed   By: Keith Rake M.D.   On: 10/17/2019 20:56   ECHO INTRAOPERATIVE TEE  Result Date: 10/17/2019  *INTRAOPERATIVE TRANSESOPHAGEAL REPORT *  Patient Name:   [facility] Date of Exam: 10/17/2019 Medical Rec #:  [id number]  Height:       70.0 in Accession #:    [id number] Weight:       170.0 lb Date of Birth:  1946/01/15  BSA:          1.95  m Patient Age:    1 years   BP:           113/55 mmHg Patient Gender: M          HR:           69 bpm. Exam Location:  Inpatient Transesophogeal exam was perform intraoperatively during surgical procedure. Patient was closely monitored under general anesthesia during the entirety of examination. Indications:     CABG Performing Phys: Goff Diagnosing Phys: Annye Asa MD Complications: No known complications during this procedure. POST-OP IMPRESSIONS - Left Ventricle: The left ventricle is essentially unchanged from pre-bypass, however there is some recovery in septal contractility. This improved with time off of CPB. Overall EF approx 50% with the balloon pump functioning. - Aortic Valve: The aortic valve appears unchanged from pre-bypass. Mild Aortic insufficiency remains. - Mitral Valve: The mitral valve appears unchanged from pre-bypass. Mild central mitral regurgitation is now present. - Tricuspid Valve: The tricuspid valve appears unchanged from pre-bypass. PRE-OP FINDINGS  Left Ventricle: The left ventricle has low normal systolic function, with an ejection fraction of 50-55%, measured 53%. There is mod-severe hypokinesis of the septal wall. The cavity size is normal. There is no  increase in left ventricular wall thickness. Right Ventricle: The right ventricle has normal systolic function. The cavity was normal. There is no increase in right ventricular wall thickness. Right ventricular systolic pressure is normal. Left Atrium: Left atrial size was normal in size. The left atrial appendage is well visualized and there is no evidence of thrombus present. Left atrial appendage velocity is normal at greater than 40 cm/s. Right Atrium: Right atrial size was normal in size. Right atrial pressure is estimated at 10 mmHg. Interatrial Septum: No atrial level shunt detected by color flow Doppler. Pericardium: There is no evidence of pericardial effusion. Mitral Valve: The mitral valve is normal in structure. No thickening of the mitral valve leaflet. No calcification of the mitral valve leaflet. Mitral valve regurgitation is not visualized by color flow Doppler. There is no evidence of mitral valve vegetation. Pulmonary venous flow is normal. There is no evidence of mitral stenosis. Tricuspid Valve: The tricuspid valve was normal in structure. Tricuspid valve regurgitation is trivial by color flow Doppler. There is no evidence of tricuspid valve vegetation. Aortic Valve: The aortic valve is tricuspid, although there is mild calcification and there may be slight fusion of the left and right coronary cusps. Aortic valve regurgitation is mild by color flow Doppler. The regurgitant jet is anteriorly-directed, toward the anterior leaflet of the mitral valve. There is no aortic stenosis, with peak gradient 7 mmHg, mean gradient 4 mmHg. There is no evidence of a vegetation on the aortic valve. Pulmonic Valve: The pulmonic valve was normal in structure, with normal leaflet excursion. There is no stenosis. Pulmonic valve regurgitation is trivial, around the PA catheter, by color flow Doppler. Aorta: There is evidence of layered plaque in the descending aorta and aortic arch; Grade I, measuring 1-31mm in size.  The balloon pump is positioioned in the descending aorta. Pulmonary Artery: Gordy Councilman catheter present on the left. The pulmonary artery is of normal size. Venous: The inferior vena cava was not well visualized. +-------------+--------++ AORTIC VALVE          +-------------+--------++ AV Mean Grad:4.0 mmHg +-------------+--------++  Annye Asa MD Electronically signed by Annye Asa MD Signature Date/Time: 10/17/2019/8:04:40 PM    Final       Medications:  Scheduled Medications: . acetaminophen  1,000 mg Oral Q6H  . aspirin EC  325 mg Oral Daily   Or  . aspirin  324 mg Per Tube Daily  . bisacodyl  10 mg Oral Daily   Or  . bisacodyl  10 mg Rectal Daily  . Chlorhexidine Gluconate Cloth  6 each Topical Daily  . Chlorhexidine Gluconate Cloth  6 each Topical Daily  . docusate sodium  200 mg Oral Daily  . metoprolol tartrate  12.5 mg Oral BID  . [START ON 10/19/2019] pantoprazole  40 mg Oral Daily  . [START ON 10/20/2019] rosuvastatin  40 mg Oral Daily  . [START ON 10/19/2019] sertraline  50 mg Oral QHS  . sodium chloride flush  10-40 mL Intracatheter Q12H  . sodium chloride flush  3 mL Intravenous Q12H  . [START ON 10/20/2019] tamsulosin  0.4 mg Oral QHS     Infusions: . sodium chloride 10 mL/hr at 10/18/19 0800  . sodium chloride    . sodium chloride 10 mL/hr at 10/18/19 0800  . albumin human 12.5 g (10/18/19 0218)  . amiodarone 30 mg/hr (10/18/19 0800)  . cefUROXime (ZINACEF)  IV 1.5 g (10/18/19 0326)  . dexmedetomidine (PRECEDEX) IV infusion Stopped (10/17/19 2346)  . insulin 0.8 mL/hr at 10/18/19 0800  . lactated ringers Stopped (10/17/19 2122)  . lactated ringers 10 mL/hr at 10/18/19 0800  . milrinone 0.3 mcg/kg/min (10/18/19 0800)  . norepinephrine (LEVOPHED) Adult infusion 12 mcg/min (10/18/19 0800)  . phenylephrine (NEO-SYNEPHRINE) Adult infusion 50 mcg/min (10/18/19 0800)     PRN Medications:  sodium chloride, albumin human, dextrose, metoprolol  tartrate, morphine injection, ondansetron (ZOFRAN) IV, oxyCODONE, sodium chloride flush, sodium chloride flush, traMADol   Assessment/Plan   1. CAD: Prior history of MI with LAD and ramus PCI.  Admitted with anterior STEMI. LHC with occluded ostial LAD (in-stent), 90% ostial ramus, 60-70% proximal LCx, nondominant RCA.  PTCA to LAD and ramus to restore flow, then CABG with LIMA-LAD and SVG-ramus.  - Continue ASA, statin.  2. Acute diastolic CHF: Echo pre-op with EF 65-70%, normal RV function, bicuspid AoV with mild AS and mild AI. He is volume overloaded on exam, CVP 12-13.  He remains on IABP 1:2 as well as phenylephrine 60, NE 16, milrinone 0.3.  - Lasix 40 mg IV bid today.  - Wean phenylephrine to begin with, then norepinephrine as tolerated.  - Reasonable to leave IABP today while weaning pressors, then hopefully out by tomorrow. He is now on 1:2. - Repeat echo post-op for LV function.  3. Rhythm: He is in NSR on amiodarone gtt.  4. SLE: History of SLE pericarditis.  Discussed with pharmacy, will restart home Plaquenil and Imuran.   CRITICAL CARE Performed by: Loralie Champagne  Total critical care time: 35 minutes  Critical care time was exclusive of separately billable procedures and treating other patients.  Critical care was necessary to treat or prevent imminent or life-threatening deterioration.  Critical care was time spent personally by me on the following activities: development of treatment plan with patient and/or surrogate as well as nursing, discussions with consultants, evaluation of patient's response to treatment, examination of patient, obtaining history from patient or surrogate, ordering and performing treatments and interventions, ordering and review of laboratory studies, ordering and review of radiographic studies, pulse oximetry and re-evaluation of patient's condition.    Length of Stay: 1  Loralie Champagne, MD  10/18/2019, 8:11 AM  Advanced Heart Failure  Team Pager [phone] (M-F; 7a -  4p)  Please contact [facility] for night-coverage after hours (4p -7a ) and weekends on [email or web address]

## 2019-10-18 NOTE — Progress Notes (Signed)
Antonieta Pert MD regarding right radial sheath that is transducing aline. MD ordered a new arterial line to be placed by RRT and remove sheath per protocol. RRT was notified.

## 2019-10-19 ENCOUNTER — Inpatient Hospital Stay: Payer: Self-pay

## 2019-10-19 ENCOUNTER — Inpatient Hospital Stay (HOSPITAL_COMMUNITY): Payer: Federal, State, Local not specified - PPO

## 2019-10-19 ENCOUNTER — Inpatient Hospital Stay (HOSPITAL_COMMUNITY): Payer: Medicare Other

## 2019-10-19 DIAGNOSIS — I5023 Acute on chronic systolic (congestive) heart failure: Secondary | ICD-10-CM

## 2019-10-19 DIAGNOSIS — I5031 Acute diastolic (congestive) heart failure: Secondary | ICD-10-CM

## 2019-10-19 LAB — CBC WITH DIFFERENTIAL/PLATELET
Abs Immature Granulocytes: 0.06 10*3/uL (ref 0.00–0.07)
Basophils Absolute: 0 10*3/uL (ref 0.0–0.1)
Basophils Relative: 0 %
Eosinophils Absolute: 0 10*3/uL (ref 0.0–0.5)
Eosinophils Relative: 0 %
HCT: 32.7 % — ABNORMAL LOW (ref 39.0–52.0)
Hemoglobin: 10.3 g/dL — ABNORMAL LOW (ref 13.0–17.0)
Immature Granulocytes: 0 %
Lymphocytes Relative: 6 %
Lymphs Abs: 1 10*3/uL (ref 0.7–4.0)
MCH: 32 pg (ref 26.0–34.0)
MCHC: 31.5 g/dL (ref 30.0–36.0)
MCV: 101.6 fL — ABNORMAL HIGH (ref 80.0–100.0)
Monocytes Absolute: 1.4 10*3/uL — ABNORMAL HIGH (ref 0.1–1.0)
Monocytes Relative: 9 %
Neutro Abs: 12.9 10*3/uL — ABNORMAL HIGH (ref 1.7–7.7)
Neutrophils Relative %: 85 %
Platelets: 100 10*3/uL — ABNORMAL LOW (ref 150–400)
RBC: 3.22 MIL/uL — ABNORMAL LOW (ref 4.22–5.81)
RDW: 13.7 % (ref 11.5–15.5)
WBC: 15.3 10*3/uL — ABNORMAL HIGH (ref 4.0–10.5)
nRBC: 0 % (ref 0.0–0.2)

## 2019-10-19 LAB — POCT I-STAT EG7
Acid-base deficit: 2 mmol/L (ref 0.0–2.0)
Acid-base deficit: 2 mmol/L (ref 0.0–2.0)
Bicarbonate: 18.7 mmol/L — ABNORMAL LOW (ref 20.0–28.0)
Bicarbonate: 19 mmol/L — ABNORMAL LOW (ref 20.0–28.0)
Calcium, Ion: 1.14 mmol/L — ABNORMAL LOW (ref 1.15–1.40)
Calcium, Ion: 1.07 mmol/L — ABNORMAL LOW (ref 1.15–1.40)
HCT: 42 % (ref 39.0–52.0)
HCT: 42 % (ref 39.0–52.0)
Hemoglobin: 14.3 g/dL (ref 13.0–17.0)
Hemoglobin: 14.3 g/dL (ref 13.0–17.0)
O2 Saturation: 68 %
O2 Saturation: 67 %
Potassium: 3.7 mmol/L (ref 3.5–5.1)
Potassium: 3.7 mmol/L (ref 3.5–5.1)
Sodium: 142 mmol/L (ref 135–145)
Sodium: 142 mmol/L (ref 135–145)
TCO2: 19 mmol/L — ABNORMAL LOW (ref 22–32)
TCO2: 20 mmol/L — ABNORMAL LOW (ref 22–32)
pCO2, Ven: 22.9 mmHg — ABNORMAL LOW (ref 44.0–60.0)
pCO2, Ven: 23.2 mmHg — ABNORMAL LOW (ref 44.0–60.0)
pH, Ven: 7.519 — ABNORMAL HIGH (ref 7.250–7.430)
pH, Ven: 7.521 — ABNORMAL HIGH (ref 7.250–7.430)
pO2, Ven: 31 mmHg — CL (ref 32.0–45.0)
pO2, Ven: 30 mmHg — CL (ref 32.0–45.0)

## 2019-10-19 LAB — POCT I-STAT, CHEM 8
BUN: 9 mg/dL (ref 8–23)
BUN: 10 mg/dL (ref 8–23)
BUN: 8 mg/dL (ref 8–23)
BUN: 9 mg/dL (ref 8–23)
BUN: 8 mg/dL (ref 8–23)
Calcium, Ion: 1.18 mmol/L (ref 1.15–1.40)
Calcium, Ion: 1.24 mmol/L (ref 1.15–1.40)
Calcium, Ion: 1.03 mmol/L — ABNORMAL LOW (ref 1.15–1.40)
Calcium, Ion: 1.13 mmol/L — ABNORMAL LOW (ref 1.15–1.40)
Calcium, Ion: 1.09 mmol/L — ABNORMAL LOW (ref 1.15–1.40)
Chloride: 110 mmol/L (ref 98–111)
Chloride: 108 mmol/L (ref 98–111)
Chloride: 105 mmol/L (ref 98–111)
Chloride: 108 mmol/L (ref 98–111)
Chloride: 107 mmol/L (ref 98–111)
Creatinine, Ser: 0.9 mg/dL (ref 0.61–1.24)
Creatinine, Ser: 0.7 mg/dL (ref 0.61–1.24)
Creatinine, Ser: 0.7 mg/dL (ref 0.61–1.24)
Creatinine, Ser: 0.6 mg/dL — ABNORMAL LOW (ref 0.61–1.24)
Creatinine, Ser: 0.6 mg/dL — ABNORMAL LOW (ref 0.61–1.24)
Glucose, Bld: 111 mg/dL — ABNORMAL HIGH (ref 70–99)
Glucose, Bld: 100 mg/dL — ABNORMAL HIGH (ref 70–99)
Glucose, Bld: 95 mg/dL (ref 70–99)
Glucose, Bld: 110 mg/dL — ABNORMAL HIGH (ref 70–99)
Glucose, Bld: 124 mg/dL — ABNORMAL HIGH (ref 70–99)
HCT: 42 % (ref 39.0–52.0)
HCT: 44 % (ref 39.0–52.0)
HCT: 29 % — ABNORMAL LOW (ref 39.0–52.0)
HCT: 31 % — ABNORMAL LOW (ref 39.0–52.0)
HCT: 26 % — ABNORMAL LOW (ref 39.0–52.0)
Hemoglobin: 14.3 g/dL (ref 13.0–17.0)
Hemoglobin: 15 g/dL (ref 13.0–17.0)
Hemoglobin: 9.9 g/dL — ABNORMAL LOW (ref 13.0–17.0)
Hemoglobin: 10.5 g/dL — ABNORMAL LOW (ref 13.0–17.0)
Hemoglobin: 8.8 g/dL — ABNORMAL LOW (ref 13.0–17.0)
Potassium: 3.5 mmol/L (ref 3.5–5.1)
Potassium: 4.1 mmol/L (ref 3.5–5.1)
Potassium: 4.5 mmol/L (ref 3.5–5.1)
Potassium: 5 mmol/L (ref 3.5–5.1)
Potassium: 4.4 mmol/L (ref 3.5–5.1)
Sodium: 140 mmol/L (ref 135–145)
Sodium: 143 mmol/L (ref 135–145)
Sodium: 142 mmol/L (ref 135–145)
Sodium: 140 mmol/L (ref 135–145)
Sodium: 143 mmol/L (ref 135–145)
TCO2: 18 mmol/L — ABNORMAL LOW (ref 22–32)
TCO2: 22 mmol/L (ref 22–32)
TCO2: 28 mmol/L (ref 22–32)
TCO2: 26 mmol/L (ref 22–32)
TCO2: 25 mmol/L (ref 22–32)

## 2019-10-19 LAB — POCT I-STAT 7, (LYTES, BLD GAS, ICA,H+H)
Acid-Base Excess: 3 mmol/L — ABNORMAL HIGH (ref 0.0–2.0)
Acid-base deficit: 6 mmol/L — ABNORMAL HIGH (ref 0.0–2.0)
Acid-base deficit: 5 mmol/L — ABNORMAL HIGH (ref 0.0–2.0)
Acid-base deficit: 1 mmol/L (ref 0.0–2.0)
Acid-base deficit: 3 mmol/L — ABNORMAL HIGH (ref 0.0–2.0)
Acid-base deficit: 2 mmol/L (ref 0.0–2.0)
Bicarbonate: 15.7 mmol/L — ABNORMAL LOW (ref 20.0–28.0)
Bicarbonate: 20.4 mmol/L (ref 20.0–28.0)
Bicarbonate: 24.3 mmol/L (ref 20.0–28.0)
Bicarbonate: 22.5 mmol/L (ref 20.0–28.0)
Bicarbonate: 28.2 mmol/L — ABNORMAL HIGH (ref 20.0–28.0)
Bicarbonate: 23.9 mmol/L (ref 20.0–28.0)
Calcium, Ion: 0.95 mmol/L — ABNORMAL LOW (ref 1.15–1.40)
Calcium, Ion: 1.22 mmol/L (ref 1.15–1.40)
Calcium, Ion: 1.02 mmol/L — ABNORMAL LOW (ref 1.15–1.40)
Calcium, Ion: 1.08 mmol/L — ABNORMAL LOW (ref 1.15–1.40)
Calcium, Ion: 1.12 mmol/L — ABNORMAL LOW (ref 1.15–1.40)
Calcium, Ion: 1.07 mmol/L — ABNORMAL LOW (ref 1.15–1.40)
HCT: 39 % (ref 39.0–52.0)
HCT: 40 % (ref 39.0–52.0)
HCT: 28 % — ABNORMAL LOW (ref 39.0–52.0)
HCT: 29 % — ABNORMAL LOW (ref 39.0–52.0)
HCT: 30 % — ABNORMAL LOW (ref 39.0–52.0)
HCT: 27 % — ABNORMAL LOW (ref 39.0–52.0)
Hemoglobin: 13.3 g/dL (ref 13.0–17.0)
Hemoglobin: 13.6 g/dL (ref 13.0–17.0)
Hemoglobin: 9.5 g/dL — ABNORMAL LOW (ref 13.0–17.0)
Hemoglobin: 10.2 g/dL — ABNORMAL LOW (ref 13.0–17.0)
Hemoglobin: 9.9 g/dL — ABNORMAL LOW (ref 13.0–17.0)
Hemoglobin: 9.2 g/dL — ABNORMAL LOW (ref 13.0–17.0)
O2 Saturation: 98 %
O2 Saturation: 100 %
O2 Saturation: 100 %
O2 Saturation: 100 %
O2 Saturation: 100 %
O2 Saturation: 100 %
Potassium: 3.2 mmol/L — ABNORMAL LOW (ref 3.5–5.1)
Potassium: 4.1 mmol/L (ref 3.5–5.1)
Potassium: 4.5 mmol/L (ref 3.5–5.1)
Potassium: 5.1 mmol/L (ref 3.5–5.1)
Potassium: 5.1 mmol/L (ref 3.5–5.1)
Potassium: 4.4 mmol/L (ref 3.5–5.1)
Sodium: 138 mmol/L (ref 135–145)
Sodium: 143 mmol/L (ref 135–145)
Sodium: 143 mmol/L (ref 135–145)
Sodium: 143 mmol/L (ref 135–145)
Sodium: 143 mmol/L (ref 135–145)
Sodium: 143 mmol/L (ref 135–145)
TCO2: 16 mmol/L — ABNORMAL LOW (ref 22–32)
TCO2: 22 mmol/L (ref 22–32)
TCO2: 26 mmol/L (ref 22–32)
TCO2: 24 mmol/L (ref 22–32)
TCO2: 30 mmol/L (ref 22–32)
TCO2: 25 mmol/L (ref 22–32)
pCO2 arterial: 21.5 mmHg — ABNORMAL LOW (ref 32.0–48.0)
pCO2 arterial: 37.4 mmHg (ref 32.0–48.0)
pCO2 arterial: 42.5 mmHg (ref 32.0–48.0)
pCO2 arterial: 41.1 mmHg (ref 32.0–48.0)
pCO2 arterial: 45.5 mmHg (ref 32.0–48.0)
pCO2 arterial: 42.9 mmHg (ref 32.0–48.0)
pH, Arterial: 7.473 — ABNORMAL HIGH (ref 7.350–7.450)
pH, Arterial: 7.344 — ABNORMAL LOW (ref 7.350–7.450)
pH, Arterial: 7.365 (ref 7.350–7.450)
pH, Arterial: 7.345 — ABNORMAL LOW (ref 7.350–7.450)
pH, Arterial: 7.4 (ref 7.350–7.450)
pH, Arterial: 7.355 (ref 7.350–7.450)
pO2, Arterial: 92 mmHg (ref 83.0–108.0)
pO2, Arterial: 247 mmHg — ABNORMAL HIGH (ref 83.0–108.0)
pO2, Arterial: 420 mmHg — ABNORMAL HIGH (ref 83.0–108.0)
pO2, Arterial: 374 mmHg — ABNORMAL HIGH (ref 83.0–108.0)
pO2, Arterial: 377 mmHg — ABNORMAL HIGH (ref 83.0–108.0)
pO2, Arterial: 329 mmHg — ABNORMAL HIGH (ref 83.0–108.0)

## 2019-10-19 LAB — BASIC METABOLIC PANEL
Anion gap: 5 (ref 5–15)
BUN: 13 mg/dL (ref 8–23)
CO2: 22 mmol/L (ref 22–32)
Calcium: 7.9 mg/dL — ABNORMAL LOW (ref 8.9–10.3)
Chloride: 110 mmol/L (ref 98–111)
Creatinine, Ser: 1.18 mg/dL (ref 0.61–1.24)
GFR calc Af Amer: >60 mL/min (ref >60)
GFR calc non Af Amer: >60 mL/min (ref >60)
Glucose, Bld: 117 mg/dL — ABNORMAL HIGH (ref 70–99)
Potassium: 4.6 mmol/L (ref 3.5–5.1)
Sodium: 137 mmol/L (ref 135–145)

## 2019-10-19 LAB — GLUCOSE, CAPILLARY
Glucose-Capillary: 105 mg/dL — ABNORMAL HIGH (ref 70–99)
Glucose-Capillary: 109 mg/dL — ABNORMAL HIGH (ref 70–99)
Glucose-Capillary: 102 mg/dL — ABNORMAL HIGH (ref 70–99)
Glucose-Capillary: 123 mg/dL — ABNORMAL HIGH (ref 70–99)
Glucose-Capillary: 99 mg/dL (ref 70–99)
Glucose-Capillary: 97 mg/dL (ref 70–99)
Glucose-Capillary: 92 mg/dL (ref 70–99)

## 2019-10-19 LAB — POCT ACTIVATED CLOTTING TIME
Activated Clotting Time: 263 seconds
Activated Clotting Time: 301 seconds
Activated Clotting Time: 252 seconds
Activated Clotting Time: 252 seconds
Activated Clotting Time: 802 seconds
Activated Clotting Time: 120 seconds

## 2019-10-19 LAB — ECHOCARDIOGRAM LIMITED
Height: 70 in
Weight: 3135.82 oz

## 2019-10-19 LAB — COOXEMETRY PANEL
Carboxyhemoglobin: 1.2 % (ref 0.5–1.5)
Methemoglobin: 1.2 % (ref 0.0–1.5)
O2 Saturation: 60.6 %
Total hemoglobin: 10.4 g/dL — ABNORMAL LOW (ref 12.0–16.0)

## 2019-10-19 MED ORDER — PROMETHAZINE HCL 25 MG/ML IJ SOLN
6.2500 mg | Freq: Four times a day (QID) | INTRAMUSCULAR | Status: DC | PRN
  Filled 2019-10-19: qty 1

## 2019-10-19 MED ORDER — ENOXAPARIN SODIUM 30 MG/0.3ML ~~LOC~~ SOLN
30.0000 mg | Freq: Every day | SUBCUTANEOUS | Status: DC
  Administered 2019-10-20 – 2019-10-26 (×7): 30 mg via SUBCUTANEOUS
  Filled 2019-10-19 (×7): qty 0.3

## 2019-10-19 MED ORDER — TAMSULOSIN HCL 0.4 MG PO CAPS
0.4000 mg | ORAL_CAPSULE | Freq: Every day | ORAL | Status: DC
  Administered 2019-10-19 – 2019-10-26 (×8): 0.4 mg via ORAL
  Filled 2019-10-19 (×8): qty 1

## 2019-10-19 MED ORDER — AMIODARONE LOAD VIA INFUSION
150.0000 mg | Freq: Once | INTRAVENOUS | Status: AC
  Administered 2019-10-19: 150 mg via INTRAVENOUS

## 2019-10-19 MED ORDER — INSULIN ASPART 100 UNIT/ML ~~LOC~~ SOLN: 0.0000 [IU] | SUBCUTANEOUS | Status: DC

## 2019-10-19 MED ORDER — CLOPIDOGREL BISULFATE 75 MG PO TABS
75.0000 mg | ORAL_TABLET | Freq: Every day | ORAL | Status: DC
  Administered 2019-10-20: 75 mg via ORAL
  Filled 2019-10-19: qty 1

## 2019-10-19 MED ORDER — SODIUM CHLORIDE 0.9% FLUSH: 10.0000 mL | INTRAVENOUS | Status: DC | PRN

## 2019-10-19 MED ORDER — AMIODARONE LOAD VIA INFUSION
150.0000 mg | Freq: Once | INTRAVENOUS | Status: AC
  Administered 2019-10-19: 150 mg via INTRAVENOUS
  Filled 2019-10-19: qty 83.34

## 2019-10-19 MED ORDER — INSULIN ASPART 100 UNIT/ML ~~LOC~~ SOLN
0.0000 [IU] | SUBCUTANEOUS | Status: DC
  Administered 2019-10-19 – 2019-10-20 (×2): 2 [IU] via SUBCUTANEOUS

## 2019-10-19 MED ORDER — FUROSEMIDE 10 MG/ML IJ SOLN
60.0000 mg | Freq: Two times a day (BID) | INTRAMUSCULAR | Status: DC
  Administered 2019-10-19 (×2): 60 mg via INTRAVENOUS
  Filled 2019-10-19 (×3): qty 6

## 2019-10-19 MED ORDER — ASPIRIN EC 325 MG PO TBEC
325.0000 mg | DELAYED_RELEASE_TABLET | Freq: Every day | ORAL | Status: AC
  Administered 2019-10-19: 325 mg via ORAL
  Filled 2019-10-19: qty 1

## 2019-10-19 MED FILL — Nitroglycerin IV Soln 100 MCG/ML in D5W: INTRA_ARTERIAL | Qty: 10 | Status: AC

## 2019-10-19 NOTE — Progress Notes (Signed)
Site area: right groin, 8 Fr. Art. And 8 Fr. Ven.  Site Prior to Removal:  Level 0  Pressure Applied For 50 MINUTES    Minutes Beginning at 1540  Manual:   Yes.    Patient Status During Pull:  Stable   Post Pull Groin Site:  Level 0  Post Pull Instructions Given:  Yes.    Post Pull Pulses Present:  Yes.  R DP  Dressing Applied:  Yes.    Comments:  Bed rest started at 1630

## 2019-10-19 NOTE — Plan of Care (Signed)
Pt is alert and oriented, on 2L [place], and has been encouraged to take deep breaths, cough, use IS, and splint as needed for pain. Pt verbalizes understanding of all nursing interventions and has rested well throughout the night. PRN medication given for pain, see MAR. Pt's foley continues to leak slightly, RN not able to accurately account for total UOP. No bleeding noted. No issues with IABP throughout the night. Levo is now at 2 mics, CO 4.32, CI 2.2, and CVP of 8. Call bell is within reach and bed is in lowest position. Problem: Education: Goal: Knowledge of General Education information will improve Description: Including pain rating scale, medication(s)/side effects and non-pharmacologic comfort measures Outcome: Progressing   Problem: Clinical Measurements: Goal: Respiratory complications will improve Outcome: Progressing Goal: Cardiovascular complication will be avoided Outcome: Progressing   Problem: Pain Managment: Goal: General experience of comfort will improve Outcome: Progressing   Problem: Safety: Goal: Ability to remain free from injury will improve Outcome: Progressing

## 2019-10-19 NOTE — Progress Notes (Signed)
  Echocardiogram 2D Echocardiogram has been performed.  Cody Arellano 10/19/2019, 11:19 AM

## 2019-10-19 NOTE — Progress Notes (Signed)
Patient ID: Cody Arellano, male   DOB: 1946/02/17, 74 y.o.   MRN: [id number]     Advanced Heart Failure Rounding Note  PCP-Cardiologist: Mertie Moores, MD   Subjective:    Anterior STEMI at admission, LHC with occluded ostial LAD (in-stent), 90% ostial ramus, 60-70% proximal LCx, nondominant RCA.  PTCA to LAD and ramus to restore flow, then CABG with LIMA-LAD and SVG-ramus. Echo pre-op with EF 65-70%, normal RV function, bicuspid AoV with mild AS and mild AI.   IABP in place, 1:2 this morning.  He is off phenylephrine and NE.  Remains on milrinone 0.3, amiodarone gtt 30.   A-paced 70s.Luiz Blare:  CVP 12 PA 39/18 CI 2-2.2 Co-ox 61%   Objective:   Weight Range: 88.9 kg Body mass index is 28.12 kg/m.   Vital Signs:   Temp:  [98.42 F (36.9 C)-99.5 F (37.5 C)] 98.6 F (37 C) (05/17 0645) Pulse Rate:  [39-80] 40 (05/17 0645) Resp:  [16-20] 16 (05/16 2000) BP: (85-193)/(39-167) 108/57 (05/17 0700) SpO2:  [90 %-99 %] 96 % (05/17 0645) Arterial Line BP: (101-140)/(40-54) 125/51 (05/17 0645) Weight:  [88.9 kg] 88.9 kg (05/17 0358)    Weight change: Filed Weights   10/17/19 1128 10/18/19 0500 10/19/19 0358  Weight: 77.1 kg 90 kg 88.9 kg    Intake/Output:   Intake/Output Summary (Last 24 hours) at 10/19/2019 0752 Last data filed at 10/19/2019 0600 Gross per 24 hour  Intake 2448.29 ml  Output 2765 ml  Net -316.71 ml      Physical Exam    General: NAD Neck: JVP 12 cm, no thyromegaly or thyroid nodule.  Lungs: Clear to auscultation bilaterally with normal respiratory effort. CV: Nondisplaced PMI.  Heart regular S1/S2, no S3/S4, no murmur.  No peripheral edema.  Abdomen: Soft, nontender, no hepatosplenomegaly, no distention.  Skin: Intact without lesions or rashes.  Neurologic: Alert and oriented x 3.  Psych: Normal affect. Extremities: No clubbing or cyanosis. IABP.  HEENT: Normal.    Telemetry   A-paced 70s (personally reviewed)  Labs    CBC Recent Labs   10/18/19 0310 10/19/19 0318  WBC 9.1 15.3*  NEUTROABS  --  12.9*  HGB 10.1* 10.3*  HCT 31.7* 32.7*  MCV 100.3* 101.6*  PLT 120* 123XX123*   Basic Metabolic Panel Recent Labs    10/18/19 0310 10/18/19 0310 10/18/19 1702 10/19/19 0318  NA 140   < > 139 137  K 4.0   < > 4.6 4.6  CL 114*   < > 110 110  CO2 20*   < > 19* 22  GLUCOSE 125*   < > 132* 117*  BUN 9   < > 11 13  CREATININE 0.91   < > 1.06 1.18  CALCIUM 7.5*   < > 7.7* 7.9*  MG 2.5*  --  2.1  --    < > = values in this interval not displayed.   Liver Function Tests Recent Labs    10/17/19 1155  AST 29  ALT 12  ALKPHOS 71  BILITOT 1.2  PROT 6.5  ALBUMIN 3.2*   No results for input(s): LIPASE, AMYLASE in the last 72 hours. Cardiac Enzymes No results for input(s): CKTOTAL, CKMB, CKMBINDEX, TROPONINI in the last 72 hours.  BNP: BNP (last 3 results) No results for input(s): BNP in the last 8760 hours.  ProBNP (last 3 results) No results for input(s): PROBNP in the last 8760 hours.   D-Dimer No results for input(s): DDIMER in  the last 72 hours. Hemoglobin A1C Recent Labs    10/17/19 1205  HGBA1C 5.7*   Fasting Lipid Panel Recent Labs    10/17/19 1155  CHOL 113  HDL 45  LDLCALC 58  TRIG 48  CHOLHDL 2.5   Thyroid Function Tests No results for input(s): TSH, T4TOTAL, T3FREE, THYROIDAB in the last 72 hours.  Invalid input(s): FREET3  Other results:   Imaging    [place] EKG SITE RITE  Result Date: 10/19/2019 If Site Rite image not attached, placement could not be confirmed due to current cardiac rhythm.    Medications:     Scheduled Medications: . acetaminophen  1,000 mg Oral Q6H  . aspirin EC  325 mg Oral Daily  . azaTHIOprine  50 mg Oral Daily  . bisacodyl  10 mg Oral Daily   Or  . bisacodyl  10 mg Rectal Daily  . Chlorhexidine Gluconate Cloth  6 each Topical Daily  . Chlorhexidine Gluconate Cloth  6 each Topical Daily  . [START ON 10/20/2019] clopidogrel  75 mg Oral Daily  .  docusate sodium  200 mg Oral Daily  . [START ON 10/20/2019] enoxaparin (LOVENOX) injection  30 mg Subcutaneous QHS  . furosemide  60 mg Intravenous BID  . hydroxychloroquine  200 mg Oral QHS  . insulin aspart  0-24 Units Subcutaneous Q4H  . metoprolol tartrate  12.5 mg Oral BID  . pantoprazole  40 mg Oral Daily  . [START ON 10/20/2019] rosuvastatin  40 mg Oral Daily  . sertraline  50 mg Oral QHS  . sodium chloride flush  10-40 mL Intracatheter Q12H  . sodium chloride flush  3 mL Intravenous Q12H  . tamsulosin  0.4 mg Oral QHS    Infusions: . sodium chloride    . amiodarone 30 mg/hr (10/19/19 0600)  . cefUROXime (ZINACEF)  IV Stopped (10/19/19 0350)  . lactated ringers Stopped (10/17/19 2122)  . lactated ringers 10 mL/hr at 10/19/19 0600  . milrinone 0.3 mcg/kg/min (10/19/19 0600)    PRN Medications: metoprolol tartrate, morphine injection, ondansetron (ZOFRAN) IV, oxyCODONE, sodium chloride flush, sodium chloride flush, traMADol   Assessment/Plan   1. CAD: Prior history of MI with LAD and ramus PCI.  Admitted with anterior STEMI. LHC with occluded ostial LAD (in-stent), 90% ostial ramus, 60-70% proximal LCx, nondominant RCA.  PTCA to LAD and ramus to restore flow, then CABG with LIMA-LAD and SVG-ramus.  - Continue ASA, statin.  2. Acute diastolic CHF: Echo pre-op with EF 65-70%, normal RV function, bicuspid AoV with mild AS and mild AI. He is volume overloaded on exam, CVP 12.  He remains on IABP 1:2, milrinone 0.3. Co-ox 61%, CI 2-2.2.  - Lasix 60 mg IV bid today.  - Continue milrinone 0.3 for now.  - IABP 1:3, if remains stable will remove.  - Still needs echo post-op for LV function.  3. Rhythm: He is in NSR on amiodarone gtt.  4. SLE: History of SLE pericarditis.  He is back on home Plaquenil and Imuran.   CRITICAL CARE Performed by: Loralie Champagne  Total critical care time: 35 minutes  Critical care time was exclusive of separately billable procedures and treating other  patients.  Critical care was necessary to treat or prevent imminent or life-threatening deterioration.  Critical care was time spent personally by me on the following activities: development of treatment plan with patient and/or surrogate as well as nursing, discussions with consultants, evaluation of patient's response to treatment, examination of patient, obtaining history from  patient or surrogate, ordering and performing treatments and interventions, ordering and review of laboratory studies, ordering and review of radiographic studies, pulse oximetry and re-evaluation of patient's condition.    Length of Stay: 2  Loralie Champagne, MD  10/19/2019, 7:52 AM  Advanced Heart Failure Team Pager [phone] (M-F; 7a - 4p)  Please contact [facility] for night-coverage after hours (4p -7a ) and weekends on [email or web address]

## 2019-10-19 NOTE — Progress Notes (Signed)
  Echocardiogram 2D Echocardiogram has been attempted. Patient undergoing sterile procedure. Will reattempt at later time.  Shawnda Mauney G Sunjai Levandoski 10/19/2019, 8:52 AM

## 2019-10-19 NOTE — Progress Notes (Signed)
      [address]       [place] [address]             [phone]      POD # 2 emergency CABG, left atrial clip  C/o nausea refractory to Zofran  BP 121/73   Pulse (!) 118   Temp 99.9 F (37.7 C)   Resp 16   Ht 5\' 10"  (1.778 m)   Wt 88.9 kg   SpO2 95%   BMI 28.12 kg/m  Milrinone @ 0.3 Atrial fib on amiodarone- just received another bolus IABP out  Continue current Rx  Raylene Carmickle C. Roxan Hockey, MD [facility] [phone]  .

## 2019-10-19 NOTE — Progress Notes (Signed)
[address]       [place] [address]             [phone]        CARDIOTHORACIC SURGERY PROGRESS NOTE   R2 Days Post-Op Procedure(s) (LRB): CORONARY ARTERY BYPASS GRAFTING (CABG) using LIMA to LAD; Endoscopic harvest right greater saphenous vein: SVG to RAMUS. (N/A) Transesophageal Echocardiogram (Tee) Clipping Of Atrial Appendage using AtriCure [id number] MM AtriClip. (N/A) Endovein Harvest Of Greater Saphenous Vein (Right)  Subjective: Looks good.  Pain in chest w/ coughing, otherwise feels well.  Slept some overnight.    Objective: Vital signs: BP Readings from Last 1 Encounters:  10/19/19 (!) 108/57   Pulse Readings from Last 1 Encounters:  10/19/19 (!) 40   Resp Readings from Last 1 Encounters:  10/18/19 16   Temp Readings from Last 1 Encounters:  10/19/19 98.6 F (37 C)    Hemodynamics: PAP: (31-40)/(14-21) 39/17 CVP:  [8 mmHg-10 mmHg] 9 mmHg CO:  [3.6 L/min-4.3 L/min] 4.3 L/min CI:  [1.8 L/min/m2-2.2 L/min/m2] 2.2 L/min/m2  Mixed venous co-ox 60.6%   Physical Exam:  Rhythm:   Sinus 70's - AAI paced  Breath sounds: clear  Heart sounds:  RRR  Incisions:  Dressing dry, intact  Abdomen:  Soft, non-distended, non-tender  Extremities:  Warm, well-perfused  Chest tubes:  low volume thin serosanguinous output, no air leak    Intake/Output from previous day: 05/16 0701 - 05/17 0700 In: 2448.3 [P.O.:560; I.V.:1588.3; IV Piggyback:300] Out: 2765 [id number]; Chest Tube:670] Intake/Output this shift: No intake/output data recorded.  Lab Results:  CBC: Recent Labs    10/18/19 0310 10/19/19 0318  WBC 9.1 15.3*  HGB 10.1* 10.3*  HCT 31.7* 32.7*  PLT 120* 100*    BMET:  Recent Labs    10/18/19 1702 10/19/19 0318  NA 139 137  K 4.6 4.6  CL 110 110  CO2 19* 22  GLUCOSE 132* 117*  BUN 11 13  CREATININE 1.06 1.18  CALCIUM 7.7* 7.9*     PT/INR:   Recent Labs    10/17/19 2034  LABPROT 18.3*  INR 1.6*    CBG  (last 3)  Recent Labs    10/18/19 1918 10/18/19 2359 10/19/19 0323  GLUCAP 124* 105* 109*    ABG    Component Value Date/Time   PHART 7.371 10/18/2019 0224   PCO2ART 35.4 10/18/2019 0224   PO2ART 113 (H) 10/18/2019 0224   HCO3 20.5 10/18/2019 0224   TCO2 22 10/18/2019 0224   ACIDBASEDEF 4.0 (H) 10/18/2019 0224   O2SAT 60.6 10/19/2019 0318    CXR: n/a  Assessment/Plan: S/P Procedure(s) (LRB): CORONARY ARTERY BYPASS GRAFTING (CABG) using LIMA to LAD; Endoscopic harvest right greater saphenous vein: SVG to RAMUS. (N/A) Transesophageal Echocardiogram (Tee) Clipping Of Atrial Appendage using AtriCure [id number] 45 MM AtriClip. (N/A) Endovein Harvest Of Greater Saphenous Vein (Right)  Doing well POD2 Maintaining NSR w/ stable hemodynamics on milrinone 0.3 IABP 1:2 off all other drips co-ox 61% and cardiac index 2.2 Breathing comfortably w/ O2 sats 96% on 2 L/min S/P acute anteroseptal STEMI with acute systolic CHF and expected post op volume excess, UOP adequate Expected post op acute blood loss anemia, mild Expected post op atelectasis, mild Post op thrombocytopenia, mild SLE Interstitial lung disease   D/C IABP  Mobilize once IABP off  D/C tubes  Diuresis  Continue milrinone for now  Insert PICC  DAPT  Beta blocker  Statin    Valentina Gu  Roxy Manns, MD 10/19/2019 7:16 AM

## 2019-10-19 NOTE — Progress Notes (Signed)
Peripherally Inserted Central Catheter Placement  The IV Nurse has discussed with the patient and/or persons authorized to consent for the patient, the purpose of this procedure and the potential benefits and risks involved with this procedure.  The benefits include less needle sticks, lab draws from the catheter, and the patient may be discharged home with the catheter. Risks include, but not limited to, infection, bleeding, blood clot (thrombus formation), and puncture of an artery; nerve damage and irregular heartbeat and possibility to perform a PICC exchange if needed/ordered by physician.  Alternatives to this procedure were also discussed.  Bard Power PICC patient education guide, fact sheet on infection prevention and patient information card has been provided to patient /or left at bedside.    PICC Placement Documentation  PICC Double Lumen [id number] PICC Right Basilic 45 cm 0 cm (Active)  Indication for Insertion or Continuance of Line Vasoactive infusions 10/19/19 0955  Exposed Catheter (cm) 0 cm 10/19/19 0955  Site Assessment Clean;Dry;Intact 10/19/19 0955  Lumen #1 Status Flushed;Saline locked;Blood return noted 10/19/19 0955  Lumen #2 Status Flushed;Saline locked;Blood return noted 10/19/19 0955  Dressing Type Transparent;Securing device 10/19/19 0955  Dressing Status Clean;Dry;Intact;Antimicrobial disc in place 10/19/19 0955  Safety Lock Not Applicable [id number]  Dressing Intervention New dressing 10/19/19 0955  Dressing Change Due 10/26/19 10/19/19 [place], Gildardo Tickner 10/19/2019, 10:16 AM

## 2019-10-19 NOTE — Addendum Note (Signed)
Addendum  created 10/19/19 1433 by Sammie Bench, CRNA   Order list changed

## 2019-10-20 ENCOUNTER — Inpatient Hospital Stay (HOSPITAL_COMMUNITY): Payer: Medicare Other

## 2019-10-20 ENCOUNTER — Encounter: Payer: Self-pay | Admitting: *Deleted

## 2019-10-20 DIAGNOSIS — I5023 Acute on chronic systolic (congestive) heart failure: Secondary | ICD-10-CM

## 2019-10-20 LAB — CBC WITH DIFFERENTIAL/PLATELET
Abs Immature Granulocytes: 0.11 10*3/uL — ABNORMAL HIGH (ref 0.00–0.07)
Basophils Absolute: 0 10*3/uL (ref 0.0–0.1)
Basophils Relative: 0 %
Eosinophils Absolute: 0 10*3/uL (ref 0.0–0.5)
Eosinophils Relative: 0 %
HCT: 30.3 % — ABNORMAL LOW (ref 39.0–52.0)
Hemoglobin: 9.6 g/dL — ABNORMAL LOW (ref 13.0–17.0)
Immature Granulocytes: 1 %
Lymphocytes Relative: 5 %
Lymphs Abs: 0.8 10*3/uL (ref 0.7–4.0)
MCH: 32 pg (ref 26.0–34.0)
MCHC: 31.7 g/dL (ref 30.0–36.0)
MCV: 101 fL — ABNORMAL HIGH (ref 80.0–100.0)
Monocytes Absolute: 1.3 10*3/uL — ABNORMAL HIGH (ref 0.1–1.0)
Monocytes Relative: 9 %
Neutro Abs: 12.2 10*3/uL — ABNORMAL HIGH (ref 1.7–7.7)
Neutrophils Relative %: 85 %
Platelets: 77 10*3/uL — ABNORMAL LOW (ref 150–400)
RBC: 3 MIL/uL — ABNORMAL LOW (ref 4.22–5.81)
RDW: 13.7 % (ref 11.5–15.5)
WBC: 14.3 10*3/uL — ABNORMAL HIGH (ref 4.0–10.5)
nRBC: 0 % (ref 0.0–0.2)

## 2019-10-20 LAB — BASIC METABOLIC PANEL
Anion gap: 8 (ref 5–15)
BUN: 20 mg/dL (ref 8–23)
CO2: 24 mmol/L (ref 22–32)
Calcium: 7.9 mg/dL — ABNORMAL LOW (ref 8.9–10.3)
Chloride: 105 mmol/L (ref 98–111)
Creatinine, Ser: 1.4 mg/dL — ABNORMAL HIGH (ref 0.61–1.24)
GFR calc Af Amer: 57 mL/min — ABNORMAL LOW (ref >60)
GFR calc non Af Amer: 49 mL/min — ABNORMAL LOW (ref >60)
Glucose, Bld: 126 mg/dL — ABNORMAL HIGH (ref 70–99)
Potassium: 4.2 mmol/L (ref 3.5–5.1)
Sodium: 137 mmol/L (ref 135–145)

## 2019-10-20 LAB — GLUCOSE, CAPILLARY
Glucose-Capillary: 123 mg/dL — ABNORMAL HIGH (ref 70–99)
Glucose-Capillary: 81 mg/dL (ref 70–99)
Glucose-Capillary: 99 mg/dL (ref 70–99)
Glucose-Capillary: 85 mg/dL (ref 70–99)
Glucose-Capillary: 83 mg/dL (ref 70–99)

## 2019-10-20 LAB — COOXEMETRY PANEL
Carboxyhemoglobin: 0.6 % (ref 0.5–1.5)
Methemoglobin: 0.4 % (ref 0.0–1.5)
O2 Saturation: 55.9 %
Total hemoglobin: 9.6 g/dL — ABNORMAL LOW (ref 12.0–16.0)

## 2019-10-20 MED ORDER — ALBUMIN HUMAN 5 % IV SOLN
12.5000 g | Freq: Once | INTRAVENOUS | Status: AC
  Administered 2019-10-20: 12.5 g via INTRAVENOUS
  Filled 2019-10-20: qty 250

## 2019-10-20 MED ORDER — ~~LOC~~ CARDIAC SURGERY, PATIENT & FAMILY EDUCATION: Freq: Once | Status: AC

## 2019-10-20 MED ORDER — ASPIRIN EC 81 MG PO TBEC
81.0000 mg | DELAYED_RELEASE_TABLET | Freq: Every day | ORAL | Status: DC
  Administered 2019-10-20 – 2019-10-27 (×8): 81 mg via ORAL
  Filled 2019-10-20 (×8): qty 1

## 2019-10-20 MED ORDER — PHENYLEPHRINE HCL-NACL 10-0.9 MG/250ML-% IV SOLN: 0.0000 ug/min | INTRAVENOUS | Status: DC

## 2019-10-20 MED ORDER — FUROSEMIDE 10 MG/ML IJ SOLN
40.0000 mg | Freq: Two times a day (BID) | INTRAMUSCULAR | Status: DC
  Administered 2019-10-20 (×2): 40 mg via INTRAVENOUS
  Filled 2019-10-20: qty 4

## 2019-10-20 MED ORDER — METOCLOPRAMIDE HCL 5 MG/ML IJ SOLN
10.0000 mg | Freq: Four times a day (QID) | INTRAMUSCULAR | Status: AC
  Administered 2019-10-20 – 2019-10-21 (×5): 10 mg via INTRAVENOUS
  Filled 2019-10-20 (×5): qty 2

## 2019-10-20 MED ORDER — FUROSEMIDE 10 MG/ML IJ SOLN: 20.0000 mg | Freq: Two times a day (BID) | INTRAMUSCULAR | Status: DC

## 2019-10-20 NOTE — Progress Notes (Signed)
Patient ID: Cody Arellano, male   DOB: [birth year], 74 y.o.   MRN: [id number]     Advanced Heart Failure Rounding Note  PCP-Cardiologist: Mertie Moores, MD   Subjective:    Anterior STEMI at admission, LHC with occluded ostial LAD (in-stent), 90% ostial ramus, 60-70% proximal LCx, nondominant RCA.  PTCA to LAD and ramus to restore flow, then CABG with LIMA-LAD and SVG-ramus. Echo pre-op with EF 65-70%, normal RV function, bicuspid AoV with mild AS and mild AI.   IABP removed 5/17.  Remains on milrinone 0.3, amiodarone gtt 30.  He had transient atrial fibrillation last night, now back in NSR.  Co-ox 56%, CVP 12-13.  I/Os negative, weight down.    Objective:   Weight Range: 86.2 kg Body mass index is 27.27 kg/m.   Vital Signs:   Temp:  [97.9 F (36.6 C)-99.9 F (37.7 C)] 97.9 F (36.6 C) (05/18 0819) Pulse Rate:  [25-128] 71 (05/18 0730) Resp:  [15-27] 21 (05/18 0730) BP: (89-126)/(56-85) 114/74 (05/18 0730) SpO2:  [85 %-100 %] 97 % (05/18 0730) Arterial Line BP: (102-147)/(50-74) 110/65 (05/17 1845) Weight:  [86.2 kg] 86.2 kg (05/18 0600)    Weight change: Filed Weights   10/18/19 0500 10/19/19 0358 10/20/19 0600  Weight: 90 kg 88.9 kg 86.2 kg    Intake/Output:   Intake/Output Summary (Last 24 hours) at 10/20/2019 0844 Last data filed at 10/20/2019 0800 Gross per 24 hour  Intake 977.18 ml  Output 2085 ml  Net -1107.82 ml      Physical Exam    General: NAD Neck: JVP 12 cm, no thyromegaly or thyroid nodule.  Lungs: Clear to auscultation bilaterally with normal respiratory effort. CV: Nondisplaced PMI.  Heart regular S1/S2, no S3/S4, no murmur.  No peripheral edema.   Abdomen: Soft, nontender, no hepatosplenomegaly, no distention.  Skin: Intact without lesions or rashes.  Neurologic: Alert and oriented x 3.  Psych: Normal affect. Extremities: No clubbing or cyanosis.  HEENT: Normal.    Telemetry   Atrial fibrillation overnight, now in NSR 70s (personally  reviewed)  Labs    CBC Recent Labs    10/19/19 0318 10/20/19 0309  WBC 15.3* 14.3*  NEUTROABS 12.9* 12.2*  HGB 10.3* 9.6*  HCT 32.7* 30.3*  MCV 101.6* 101.0*  PLT 100* 77*   Basic Metabolic Panel Recent Labs    10/18/19 0310 10/18/19 0310 10/18/19 1702 10/18/19 1702 10/19/19 0318 10/20/19 0309  NA 140   < > 139   < > 137 137  K 4.0   < > 4.6   < > 4.6 4.2  CL 114*   < > 110   < > 110 105  CO2 20*   < > 19*   < > 22 24  GLUCOSE 125*   < > 132*   < > 117* 126*  BUN 9   < > 11   < > 13 20  CREATININE 0.91   < > 1.06   < > 1.18 1.40*  CALCIUM 7.5*   < > 7.7*   < > 7.9* 7.9*  MG 2.5*  --  2.1  --   --   --    < > = values in this interval not displayed.   Liver Function Tests Recent Labs    10/17/19 1155  AST 29  ALT 12  ALKPHOS 71  BILITOT 1.2  PROT 6.5  ALBUMIN 3.2*   No results for input(s): LIPASE, AMYLASE in the last 72 hours. Cardiac Enzymes No  results for input(s): CKTOTAL, CKMB, CKMBINDEX, TROPONINI in the last 72 hours.  BNP: BNP (last 3 results) No results for input(s): BNP in the last 8760 hours.  ProBNP (last 3 results) No results for input(s): PROBNP in the last 8760 hours.   D-Dimer No results for input(s): DDIMER in the last 72 hours. Hemoglobin A1C Recent Labs    10/17/19 1205  HGBA1C 5.7*   Fasting Lipid Panel Recent Labs    10/17/19 1155  CHOL 113  HDL 45  LDLCALC 58  TRIG 48  CHOLHDL 2.5   Thyroid Function Tests No results for input(s): TSH, T4TOTAL, T3FREE, THYROIDAB in the last 72 hours.  Invalid input(s): FREET3  Other results:   Imaging    DG Chest Port 1 View  Result Date: 10/20/2019 CLINICAL DATA:  Bypass surgery. EXAM: PORTABLE CHEST 1 VIEW COMPARISON:  10/18/2019 FINDINGS: The Swan-Ganz catheter has been removed. Bilateral chest tubes and mediastinal drain tubes are stable. Stable cardiac enlargement, bibasilar atelectasis and small effusions persist. Slight improved perihilar aeration may suggest  resolving pulmonary edema. New right-sided PICC line has a lateral curvature of the tip and is likely in the azygos vein. Recommend repositioning. IMPRESSION: 1. Removal of Swan-Ganz catheter. 2. Right-sided PICC line tip is likely in the azygos vein. Recommend repositioning. 3. Stable chest tube without pneumothorax. 4. Slight improved perihilar aeration. Electronically Signed   By: Marijo Sanes M.D.   On: 10/20/2019 08:17   ECHOCARDIOGRAM LIMITED  Result Date: 10/19/2019    ECHOCARDIOGRAM LIMITED REPORT   [name] Date of Exam: 10/19/2019 Medical Rec #:  [id number]  Height:       70.0 in Accession #:    [id number] Weight:       196.0 lb Date of Birth:  03/18/1946  BSA:          2.069 m Patient Age:    15 years   BP:           92/58 mmHg Patient Gender: M          HR:           72 bpm. Exam Location:  Inpatient Procedure: Limited Echo, Cardiac Doppler and Limited Color Doppler Indications:    I50.23 Acute on chronic systolic (congestive) heart failure  History:        Patient has prior history of Echocardiogram examinations, most                 recent 10/17/2019. Previous Myocardial Infarction and CAD, Prior                 CABG, COPD, Signs/Symptoms:Dyspnea; Risk Factors:Hypertension                 and Dyslipidemia. Lupus.  Sonographer:    Cody Arellano Referring Phys: Cody Arellano  1. Left ventricular ejection fraction, by estimation, is 45 to 50%. The left ventricle has mildly decreased function. The left ventricle has no regional wall motion abnormalities. There is moderate hypokinesis of the left ventricular, mid-apical septal wall, anterolateral wall, anterior wall and apical segment.  2. Right ventricular systolic function is mildly reduced. The right ventricular size is normal.  3. The mitral valve is normal in structure. Mild mitral valve regurgitation. No evidence of mitral stenosis.  4. The aortic valve is bicuspid. Aortic valve regurgitation is mild. Mild aortic  valve stenosis. FINDINGS  Left Ventricle: Left ventricular ejection fraction, by estimation, is 45 to 50%. The left  ventricle has mildly decreased function. The left ventricle has no regional wall motion abnormalities. Moderate hypokinesis of the left ventricular, mid-apical septal wall, anterolateral wall, anterior wall and apical segment. Abnormal (paradoxical) septal motion consistent with post-operative status. Right Ventricle: The right ventricular size is normal. No increase in right ventricular wall thickness. Right ventricular systolic function is mildly reduced. Left Atrium: Left atrial size was normal in size. Right Atrium: Right atrial size was normal in size. Pericardium: There is no evidence of pericardial effusion. Mitral Valve: The mitral valve is normal in structure. There is mild thickening of the mitral valve leaflet(s). There is mild calcification of the mitral valve leaflet(s). Mild mitral valve regurgitation. No evidence of mitral valve stenosis. Tricuspid Valve: The tricuspid valve is normal in structure. Tricuspid valve regurgitation is mild . No evidence of tricuspid stenosis. Aortic Valve: The aortic valve is bicuspid. . There is mild thickening and mild calcification of the aortic valve. Aortic valve regurgitation is mild. Mild aortic stenosis is present. There is mild thickening of the aortic valve. There is mild calcification of the aortic valve. Pulmonic Valve: The pulmonic valve was not well visualized. Pulmonic valve regurgitation is mild. No evidence of pulmonic stenosis. Aorta: The aortic root and ascending aorta are structurally normal, with no evidence of dilitation. Venous: The inferior vena cava was not well visualized. IAS/Shunts: The atrial septum is grossly normal. Additional Comments: There is a small pleural effusion in both left and right lateral regions.  LEFT VENTRICLE PLAX 2D LVIDd:         4.60 cm  Diastology LVIDs:         2.90 cm  LV e' lateral:   3.65 cm/s LV PW:          1.10 cm  LV E/e' lateral: 13.3 LV IVS:        1.10 cm  LV e' medial:    4.05 cm/s LVOT diam:     2.10 cm  LV E/e' medial:  12.0 LV SV:         53 LV SV Index:   26 LVOT Area:     3.46 cm  RIGHT VENTRICLE RV Basal diam:  2.90 cm RV S prime:     9.65 cm/s TAPSE (M-mode): 1.5 cm LEFT ATRIUM         Index      RIGHT ATRIUM           Index LA diam:    3.30 cm 1.59 cm/m RA Area:     19.40 cm                                RA Volume:   51.80 ml  25.03 ml/m  AORTIC VALVE LVOT Vmax:   102.00 cm/s LVOT Vmean:  58.600 cm/s LVOT VTI:    0.154 m  AORTA Ao Root diam: 3.50 cm Ao Asc diam:  3.60 cm MITRAL VALVE MV Area (PHT): 2.99 cm    SHUNTS MV Decel Time: 254 msec    Systemic VTI:  0.15 m MV E velocity: 48.70 cm/s  Systemic Diam: 2.10 cm MV A velocity: 43.30 cm/s MV E/A ratio:  1.12 Buford Dresser MD Electronically signed by Buford Dresser MD Signature Date/Time: 10/19/2019/6:48:45 PM    Final      Medications:     Scheduled Medications: . acetaminophen  1,000 mg Oral Q6H  . aspirin EC  81 mg Oral Daily  .  azaTHIOprine  50 mg Oral Daily  . bisacodyl  10 mg Oral Daily   Or  . bisacodyl  10 mg Rectal Daily  . Chlorhexidine Gluconate Cloth  6 each Topical Daily  . Chlorhexidine Gluconate Cloth  6 each Topical Daily  . clopidogrel  75 mg Oral Daily  . [facility] Education   Does not apply Once  . docusate sodium  200 mg Oral Daily  . enoxaparin (LOVENOX) injection  30 mg Subcutaneous QHS  . furosemide  40 mg Intravenous BID  . hydroxychloroquine  200 mg Oral QHS  . insulin aspart  0-24 Units Subcutaneous Q4H  . metoCLOPramide (REGLAN) injection  10 mg Intravenous Q6H  . metoprolol tartrate  12.5 mg Oral BID  . pantoprazole  40 mg Oral Daily  . rosuvastatin  40 mg Oral Daily  . sertraline  50 mg Oral QHS  . sodium chloride flush  10-40 mL Intracatheter Q12H  . sodium chloride flush  3 mL Intravenous Q12H  . tamsulosin  0.4 mg Oral QHS     Infusions: . sodium chloride    . amiodarone 30 mg/hr (10/20/19 0800)  . lactated ringers Stopped (10/17/19 2122)  . milrinone 0.3 mcg/kg/min (10/20/19 0800)    PRN Medications: metoprolol tartrate, morphine injection, ondansetron (ZOFRAN) IV, oxyCODONE, promethazine, sodium chloride flush, sodium chloride flush, sodium chloride flush, traMADol   Assessment/Plan   1. CAD: Prior history of MI with LAD and ramus PCI.  Admitted with anterior STEMI. LHC with occluded ostial LAD (in-stent), 90% ostial ramus, 60-70% proximal LCx, nondominant RCA.  PTCA to LAD and ramus to restore flow, then CABG with LIMA-LAD and SVG-ramus.  - Continue ASA, statin.  2. Acute systolic CHF: Post-op echo with EF 45-50%, septal hypokinesis, bicuspid AoV with mild AS and mild AI.  He is volume overloaded on exam, CVP 12-13.  IABP out, he remains on milrinone 0.3. Co-ox 56%.  - Creatinine up to 1.4, will decrease Lasix to 40 mg IV bid for today.   - Decrease milrinone to 0.25 mcg/kg/min.  3. Atrial fibrillation: Paroxysmal post-op.  He is in NSR on amiodarone gtt.  4. SLE: History of SLE pericarditis.  He is back on home Plaquenil and Imuran.  5. Bicuspid aortic valve: Mild AS and mild AI.   CRITICAL CARE Performed by: Loralie Champagne  Total critical care time: 35 minutes  Critical care time was exclusive of separately billable procedures and treating other patients.  Critical care was necessary to treat or prevent imminent or life-threatening deterioration.  Critical care was time spent personally by me on the following activities: development of treatment plan with patient and/or surrogate as well as nursing, discussions with consultants, evaluation of patient's response to treatment, examination of patient, obtaining history from patient or surrogate, ordering and performing treatments and interventions, ordering and review of laboratory studies, ordering and review of radiographic studies, pulse oximetry  and re-evaluation of patient's condition.    Length of Stay: 3  Loralie Champagne, MD  10/20/2019, 8:44 AM  Advanced Heart Failure Team Pager [phone] (M-F; 7a - 4p)  Please contact [facility] for night-coverage after hours (4p -7a ) and weekends on [email or web address]

## 2019-10-20 NOTE — Progress Notes (Addendum)
TCTS DAILY ICU PROGRESS NOTE                   [address]            [place] [address]          [phone]   3 Days Post-Op Procedure(s) (LRB): CORONARY ARTERY BYPASS GRAFTING (CABG) using LIMA to LAD; Endoscopic harvest right greater saphenous vein: SVG to RAMUS. (N/A) Transesophageal Echocardiogram (Tee) Clipping Of Atrial Appendage using AtriCure [id number] 45 MM AtriClip. (N/A) Endovein Harvest Of Greater Saphenous Vein (Right)  Total Length of Stay:  LOS: 3 days   Subjective: Awake and alert, still having some nausea and vague abdominal discomfort.  A-fib last night with conversion back to SR on IV amio.  Milrinone at 0.4mcg/kg/min. IABP and CT's out yesterday.   Objective: Vital signs in last 24 hours: Temp:  [98.1 F (36.7 C)-99.9 F (37.7 C)] 98.1 F (36.7 C) (05/18 0400) Pulse Rate:  [25-128] 70 (05/18 0700) Cardiac Rhythm: A-V Sequential paced (05/18 0400) Resp:  [15-27] 16 (05/18 0700) BP: (89-126)/(56-85) 110/65 (05/18 0700) SpO2:  [85 %-100 %] 94 % (05/18 0700) Arterial Line BP: (102-147)/(50-74) 110/65 (05/17 1845) Weight:  [86.2 kg] 86.2 kg (05/18 0600)  Filed Weights   10/18/19 0500 10/19/19 0358 10/20/19 0600  Weight: 90 kg 88.9 kg 86.2 kg    Weight change: -2.7 kg   Hemodynamic parameters for last 24 hours: PAP: (32-56)/(13-33) 36/22 CVP:  [10 mmHg] 10 mmHg CO:  [3.6 L/min] 3.6 L/min CI:  [1.8 L/min/m2] 1.8 L/min/m2  Intake/Output from previous day: 05/17 0701 - 05/18 0700 In: 1005 [I.V.:904.9; IV Piggyback:100.1] Out: 2115 [Urine:1685; Chest Tube:430]  Intake/Output this shift: No intake/output data recorded.  Current Meds: Scheduled Meds: . acetaminophen  1,000 mg Oral Q6H  . aspirin EC  81 mg Oral Daily  . azaTHIOprine  50 mg Oral Daily  . bisacodyl  10 mg Oral Daily   Or  . bisacodyl  10 mg Rectal Daily  . Chlorhexidine Gluconate Cloth  6 each Topical Daily  . Chlorhexidine Gluconate Cloth  6 each Topical Daily    . clopidogrel  75 mg Oral Daily  . [place] [facility] Education   Does not apply Once  . docusate sodium  200 mg Oral Daily  . enoxaparin (LOVENOX) injection  30 mg Subcutaneous QHS  . furosemide  60 mg Intravenous BID  . hydroxychloroquine  200 mg Oral QHS  . insulin aspart  0-24 Units Subcutaneous Q4H  . metoCLOPramide (REGLAN) injection  10 mg Intravenous Q6H  . metoprolol tartrate  12.5 mg Oral BID  . pantoprazole  40 mg Oral Daily  . rosuvastatin  40 mg Oral Daily  . sertraline  50 mg Oral QHS  . sodium chloride flush  10-40 mL Intracatheter Q12H  . sodium chloride flush  3 mL Intravenous Q12H  . tamsulosin  0.4 mg Oral QHS   Continuous Infusions: . sodium chloride    . amiodarone 30 mg/hr (10/20/19 0700)  . lactated ringers Stopped (10/17/19 2122)  . lactated ringers Stopped (10/19/19 1815)  . milrinone 0.3 mcg/kg/min (10/20/19 0700)   PRN Meds:.metoprolol tartrate, morphine injection, ondansetron (ZOFRAN) IV, oxyCODONE, promethazine, sodium chloride flush, sodium chloride flush, sodium chloride flush, traMADol  General appearance: alert, cooperative and mild distress Neurologic: intact Heart: RRR, monitor shows NSR. Lungs: Breath sounds are clear anterior.  Abdomen: Mild distension and tenderness on left side. Absent bowel sounds. Extremities:  all warm and well perfused, IABP exit site is soft and hemostatic. Wound: the Aquacel dressing was removed. The incision is well approximated and dry.  Lab Results: CBC: Recent Labs    10/19/19 0318 10/20/19 0309  WBC 15.3* 14.3*  HGB 10.3* 9.6*  HCT 32.7* 30.3*  PLT 100* 77*   BMET:  Recent Labs    10/19/19 0318 10/20/19 0309  NA 137 137  K 4.6 4.2  CL 110 105  CO2 22 24  GLUCOSE 117* 126*  BUN 13 20  CREATININE 1.18 1.40*  CALCIUM 7.9* 7.9*    CMET: Lab Results  Component Value Date   WBC 14.3 (H) 10/20/2019   HGB 9.6 (L) 10/20/2019   HCT 30.3 (L) 10/20/2019   PLT 77 (L)  10/20/2019   GLUCOSE 126 (H) 10/20/2019   CHOL 113 10/17/2019   TRIG 48 10/17/2019   HDL 45 10/17/2019   LDLCALC 58 10/17/2019   ALT 12 10/17/2019   AST 29 10/17/2019   NA 137 10/20/2019   K 4.2 10/20/2019   CL 105 10/20/2019   CREATININE 1.40 (H) 10/20/2019   BUN 20 10/20/2019   CO2 24 10/20/2019   PSA 2.71 01/17/2016   INR 1.6 (H) 10/17/2019   HGBA1C 5.7 (H) 10/17/2019      PT/INR:  Recent Labs    10/17/19 2034  LABPROT 18.3*  INR 1.6*   Radiology: ECHOCARDIOGRAM LIMITED  Result Date: 10/19/2019    ECHOCARDIOGRAM LIMITED REPORT   [name] Markoff Date of Exam: 10/19/2019 Medical Rec #:  [id number]  Height:       70.0 in Accession #:    [id number] Weight:       196.0 lb Date of Birth:  [birth year]  BSA:          2.069 m Patient Age:    74 years   BP:           92/58 mmHg Patient Gender: M          HR:           72 bpm. Exam Location:  Inpatient Procedure: Limited Echo, Cardiac Doppler and Limited Color Doppler Indications:    I50.23 Acute on chronic systolic (congestive) heart failure  History:        Patient has prior history of Echocardiogram examinations, most                 recent 10/17/2019. Previous Myocardial Infarction and CAD, Prior                 CABG, COPD, Signs/Symptoms:Dyspnea; Risk Factors:Hypertension                 and Dyslipidemia. Lupus.  Sonographer:    Jonelle Sidle Dance Referring Phys: [place]  1. Left ventricular ejection fraction, by estimation, is 45 to 50%. The left ventricle has mildly decreased function. The left ventricle has no regional wall motion abnormalities. There is moderate hypokinesis of the left ventricular, mid-apical septal wall, anterolateral wall, anterior wall and apical segment.  2. Right ventricular systolic function is mildly reduced. The right ventricular size is normal.  3. The mitral valve is normal in structure. Mild mitral valve regurgitation. No evidence of mitral stenosis.  4. The aortic valve is  bicuspid. Aortic valve regurgitation is mild. Mild aortic valve stenosis. FINDINGS  Left Ventricle: Left ventricular ejection fraction, by estimation, is 45 to 50%. The left ventricle has mildly decreased function. The left ventricle  has no regional wall motion abnormalities. Moderate hypokinesis of the left ventricular, mid-apical septal wall, anterolateral wall, anterior wall and apical segment. Abnormal (paradoxical) septal motion consistent with post-operative status. Right Ventricle: The right ventricular size is normal. No increase in right ventricular wall thickness. Right ventricular systolic function is mildly reduced. Left Atrium: Left atrial size was normal in size. Right Atrium: Right atrial size was normal in size. Pericardium: There is no evidence of pericardial effusion. Mitral Valve: The mitral valve is normal in structure. There is mild thickening of the mitral valve leaflet(s). There is mild calcification of the mitral valve leaflet(s). Mild mitral valve regurgitation. No evidence of mitral valve stenosis. Tricuspid Valve: The tricuspid valve is normal in structure. Tricuspid valve regurgitation is mild . No evidence of tricuspid stenosis. Aortic Valve: The aortic valve is bicuspid. . There is mild thickening and mild calcification of the aortic valve. Aortic valve regurgitation is mild. Mild aortic stenosis is present. There is mild thickening of the aortic valve. There is mild calcification of the aortic valve. Pulmonic Valve: The pulmonic valve was not well visualized. Pulmonic valve regurgitation is mild. No evidence of pulmonic stenosis. Aorta: The aortic root and ascending aorta are structurally normal, with no evidence of dilitation. Venous: The inferior vena cava was not well visualized. IAS/Shunts: The atrial septum is grossly normal. Additional Comments: There is a small pleural effusion in both left and right lateral regions.  LEFT VENTRICLE PLAX 2D LVIDd:         4.60 cm  Diastology  LVIDs:         2.90 cm  LV e' lateral:   3.65 cm/s LV PW:         1.10 cm  LV E/e' lateral: 13.3 LV IVS:        1.10 cm  LV e' medial:    4.05 cm/s LVOT diam:     2.10 cm  LV E/e' medial:  12.0 LV SV:         53 LV SV Index:   26 LVOT Area:     3.46 cm  RIGHT VENTRICLE RV Basal diam:  2.90 cm RV S prime:     9.65 cm/s TAPSE (M-mode): 1.5 cm LEFT ATRIUM         Index      RIGHT ATRIUM           Index LA diam:    3.30 cm 1.59 cm/m RA Area:     19.40 cm                                RA Volume:   51.80 ml  25.03 ml/m  AORTIC VALVE LVOT Vmax:   102.00 cm/s LVOT Vmean:  58.600 cm/s LVOT VTI:    0.154 m  AORTA Ao Root diam: 3.50 cm Ao Asc diam:  3.60 cm MITRAL VALVE MV Area (PHT): 2.99 cm    SHUNTS MV Decel Time: 254 msec    Systemic VTI:  0.15 m MV E velocity: 48.70 cm/s  Systemic Diam: 2.10 cm MV A velocity: 43.30 cm/s MV E/A ratio:  1.12 Buford Dresser MD Electronically signed by Buford Dresser MD Signature Date/Time: 10/19/2019/6:48:45 PM    Final      Assessment/Plan: S/P Procedure(s) (LRB): CORONARY ARTERY BYPASS GRAFTING (CABG) using LIMA to LAD; Endoscopic harvest right greater saphenous vein: SVG to RAMUS. (N/A) Transesophageal Echocardiogram (Tee) Clipping Of Atrial Appendage using AtriCure [id number]  26 MM AtriClip. (N/A) Endovein Harvest Of Greater Saphenous Vein (Right)  -POD-3 emergency CABG x 2 after presenting with acute STEMI. IABP removed yesterday. Stable hemodynamics. On milrinone 0.34mcg/kg/min, mgt per HF team.  CoOx 55.9. Advance activity today. Continue Plavix, statin, metoprolol.   -Post-op afib- converted back to SR on IV amiodarone. Convert to oral amiodarone today. K+ 4.2., Last Mg++ 2.1 on 5/16.  --Persistent nausea--absent BS, will give Reglan q6h x 5 doses. Continue clear liquids for now.   -Expected ABL anemia- Hct trending up.   -Volume excess- remains 9kg positive. Continue diuresis, watch Creat. (1.4 today)  -DVT PPX-On daily enoxaparin.   -History of  lupus- Imuran resumed.     Antony Odea, PA-C [phone] 10/20/2019 7:58 AM   I have seen and examined the patient and agree with the assessment and plan as outlined.  Afib overnight, now back in NSR on IV amiodarone.  Stable BP off all drips other than milrinone 0.3 co-ox 56%.  Adequate UOP creatinine up slightly 1.4 c/w likely due to prerenal azotemia +/- acute kidney injury caused by ATN +/- IABP.  Will decrease lasix dose.  Denies abdominal pain.  Mild nausea.  Somewhat distended stomach on pCXR this morning.  Will start Reglan and hold off on advancing diet.  Mobilize.  Rexene Alberts, MD 10/20/2019 8:30 AM

## 2019-10-20 NOTE — Progress Notes (Signed)
Patient ID: Cody Arellano, male   DOB: [birth year], 74 y.o.   MRN: [id number] TCTS Evening Rounds:  Hemodynamically stable on Milrinone 0.25.  Postop atrial fib on amio drip. Has been sinus today.  Urine output good.  Ambulated around unit tonight.

## 2019-10-21 ENCOUNTER — Inpatient Hospital Stay (HOSPITAL_COMMUNITY): Payer: Medicare Other

## 2019-10-21 ENCOUNTER — Encounter: Payer: Self-pay | Admitting: Internal Medicine

## 2019-10-21 DIAGNOSIS — I4891 Unspecified atrial fibrillation: Secondary | ICD-10-CM

## 2019-10-21 LAB — CBC WITH DIFFERENTIAL/PLATELET
Abs Immature Granulocytes: 0.05 10*3/uL (ref 0.00–0.07)
Basophils Absolute: 0 10*3/uL (ref 0.0–0.1)
Basophils Relative: 0 %
Eosinophils Absolute: 0.2 10*3/uL (ref 0.0–0.5)
Eosinophils Relative: 2 %
HCT: 27.8 % — ABNORMAL LOW (ref 39.0–52.0)
Hemoglobin: 9 g/dL — ABNORMAL LOW (ref 13.0–17.0)
Immature Granulocytes: 1 %
Lymphocytes Relative: 10 %
Lymphs Abs: 1.1 10*3/uL (ref 0.7–4.0)
MCH: 32.3 pg (ref 26.0–34.0)
MCHC: 32.4 g/dL (ref 30.0–36.0)
MCV: 99.6 fL (ref 80.0–100.0)
Monocytes Absolute: 0.8 10*3/uL (ref 0.1–1.0)
Monocytes Relative: 7 %
Neutro Abs: 8.6 10*3/uL — ABNORMAL HIGH (ref 1.7–7.7)
Neutrophils Relative %: 80 %
Platelets: 89 10*3/uL — ABNORMAL LOW (ref 150–400)
RBC: 2.79 MIL/uL — ABNORMAL LOW (ref 4.22–5.81)
RDW: 13.5 % (ref 11.5–15.5)
WBC: 10.8 10*3/uL — ABNORMAL HIGH (ref 4.0–10.5)
nRBC: 0 % (ref 0.0–0.2)

## 2019-10-21 LAB — BPAM RBC
Blood Product Expiration Date: 202106072359
Blood Product Expiration Date: 202106072359
ISSUE DATE / TIME: 202105151521
ISSUE DATE / TIME: 202105151521
Unit Type and Rh: 6200
Unit Type and Rh: 6200

## 2019-10-21 LAB — TYPE AND SCREEN
ABO/RH(D): A POS
Antibody Screen: NEGATIVE
Unit division: 0
Unit division: 0

## 2019-10-21 LAB — GLUCOSE, CAPILLARY
Glucose-Capillary: 115 mg/dL — ABNORMAL HIGH (ref 70–99)
Glucose-Capillary: 146 mg/dL — ABNORMAL HIGH (ref 70–99)
Glucose-Capillary: 45 mg/dL — ABNORMAL LOW (ref 70–99)
Glucose-Capillary: 79 mg/dL (ref 70–99)
Glucose-Capillary: 79 mg/dL (ref 70–99)
Glucose-Capillary: 82 mg/dL (ref 70–99)
Glucose-Capillary: 87 mg/dL (ref 70–99)
Glucose-Capillary: 95 mg/dL (ref 70–99)

## 2019-10-21 LAB — COOXEMETRY PANEL
Carboxyhemoglobin: 1 % (ref 0.5–1.5)
Methemoglobin: 0.8 % (ref 0.0–1.5)
O2 Saturation: 61.4 %
Total hemoglobin: 9 g/dL — ABNORMAL LOW (ref 12.0–16.0)

## 2019-10-21 LAB — BASIC METABOLIC PANEL
Anion gap: 8 (ref 5–15)
BUN: 25 mg/dL — ABNORMAL HIGH (ref 8–23)
CO2: 26 mmol/L (ref 22–32)
Calcium: 7.6 mg/dL — ABNORMAL LOW (ref 8.9–10.3)
Chloride: 103 mmol/L (ref 98–111)
Creatinine, Ser: 1.22 mg/dL (ref 0.61–1.24)
GFR calc Af Amer: >60 mL/min (ref >60)
GFR calc non Af Amer: 58 mL/min — ABNORMAL LOW (ref >60)
Glucose, Bld: 93 mg/dL (ref 70–99)
Potassium: 3.6 mmol/L (ref 3.5–5.1)
Sodium: 137 mmol/L (ref 135–145)

## 2019-10-21 LAB — MAGNESIUM: Magnesium: 1.9 mg/dL (ref 1.7–2.4)

## 2019-10-21 MED ORDER — MAGNESIUM SULFATE 2 GM/50ML IV SOLN
2.0000 g | Freq: Once | INTRAVENOUS | Status: AC
  Administered 2019-10-21: 2 g via INTRAVENOUS
  Filled 2019-10-21: qty 50

## 2019-10-21 MED ORDER — POTASSIUM CHLORIDE CRYS ER 20 MEQ PO TBCR
20.0000 meq | EXTENDED_RELEASE_TABLET | ORAL | Status: DC
  Filled 2019-10-21: qty 1

## 2019-10-21 MED ORDER — METOCLOPRAMIDE HCL 5 MG/ML IJ SOLN: 10.0000 mg | Freq: Four times a day (QID) | INTRAMUSCULAR | Status: AC

## 2019-10-21 MED ORDER — POTASSIUM CHLORIDE 10 MEQ/50ML IV SOLN
10.0000 meq | INTRAVENOUS | Status: AC
  Administered 2019-10-21 (×6): 10 meq via INTRAVENOUS
  Filled 2019-10-21 (×6): qty 50

## 2019-10-21 MED ORDER — METOPROLOL TARTRATE 25 MG PO TABS
25.0000 mg | ORAL_TABLET | Freq: Two times a day (BID) | ORAL | Status: DC
  Administered 2019-10-21 (×2): 25 mg via ORAL
  Filled 2019-10-21 (×2): qty 1

## 2019-10-21 MED ORDER — AMIODARONE IV BOLUS ONLY 150 MG/100ML
150.0000 mg | Freq: Once | INTRAVENOUS | Status: AC
  Administered 2019-10-21: 150 mg via INTRAVENOUS
  Filled 2019-10-21: qty 100

## 2019-10-21 MED ORDER — DIGOXIN 125 MCG PO TABS
0.2500 mg | ORAL_TABLET | Freq: Every day | ORAL | Status: DC
  Administered 2019-10-21: 0.25 mg via ORAL
  Filled 2019-10-21: qty 2

## 2019-10-21 MED FILL — Magnesium Sulfate Inj 50%: INTRAMUSCULAR | Qty: 10 | Status: AC

## 2019-10-21 MED FILL — Heparin Sodium (Porcine) Inj 1000 Unit/ML: INTRAMUSCULAR | Qty: 30 | Status: AC

## 2019-10-21 MED FILL — Potassium Chloride Inj 2 mEq/ML: INTRAVENOUS | Qty: 40 | Status: AC

## 2019-10-21 NOTE — Progress Notes (Signed)
TCTS Evening rounds  POD #2 s/p CABG Back in NSR Hemodyn stable BP 120/73   Pulse 77   Temp 98.6 F (37 C) (Oral)   Resp (!) 24   Ht 5\' 10"  (1.778 m)   Wt 84.4 kg   SpO2 90%   BMI 26.70 kg/m     Intake/Output Summary (Last 24 hours) at 10/21/2019 1943 Last data filed at 10/21/2019 1800 Gross per 24 hour  Intake 1142.79 ml  Output 1725 ml  Net -582.21 ml     Exam unremarkable.  A/p: continue present management Cody Arellano Z. Orvan Seen, [place]

## 2019-10-21 NOTE — Progress Notes (Addendum)
TCTS DAILY ICU PROGRESS NOTE                   [address]            [place] [address]          [phone]   4 Days Post-Op Procedure(s) (LRB): CORONARY ARTERY BYPASS GRAFTING (CABG) using LIMA to LAD; Endoscopic harvest right greater saphenous vein: SVG to RAMUS. (N/A) Transesophageal Echocardiogram (Tee) Clipping Of Atrial Appendage using AtriCure [id number] 45 MM AtriClip. (N/A) Endovein Harvest Of Greater Saphenous Vein (Right)  Total Length of Stay:  LOS: 4 days   Subjective: Awake and alert, offers no new concerns.  Back in atrial fibrillation.  Milrinone at 0.25mcg.kg.min Amiodarone re-bolused this and increased to 60mg /mg   Objective: Vital signs in last 24 hours: Temp:  [97.9 F (36.6 C)-98.9 F (37.2 C)] 98.1 F (36.7 C) (05/19 0800) Pulse Rate:  [65-141] 141 (05/19 0630) Cardiac Rhythm: Atrial fibrillation (05/19 0408) Resp:  [14-32] 25 (05/19 0630) BP: (82-119)/(50-88) 107/67 (05/19 0630) SpO2:  [87 %-99 %] 87 % (05/19 0630) Weight:  [84.4 kg] 84.4 kg (05/19 0600)  Filed Weights   10/19/19 0358 10/20/19 0600 10/21/19 0600  Weight: 88.9 kg 86.2 kg 84.4 kg    Weight change: -1.8 kg   Hemodynamic parameters for last 24 hours:    Intake/Output from previous day: 05/18 0701 - 05/19 0700 In: 699.9 [I.V.:643.4; IV Piggyback:56.5] Out: 1945 [id number]  Intake/Output this shift: No intake/output data recorded.  Current Meds: Scheduled Meds: . acetaminophen  1,000 mg Oral Q6H  . aspirin EC  81 mg Oral Daily  . azaTHIOprine  50 mg Oral Daily  . bisacodyl  10 mg Oral Daily   Or  . bisacodyl  10 mg Rectal Daily  . Chlorhexidine Gluconate Cloth  6 each Topical Daily  . Chlorhexidine Gluconate Cloth  6 each Topical Daily  . digoxin  0.25 mg Oral Daily  . docusate sodium  200 mg Oral Daily  . enoxaparin (LOVENOX) injection  30 mg Subcutaneous QHS  . hydroxychloroquine  200 mg Oral QHS  . insulin aspart  0-24 Units Subcutaneous Q4H  .  metoprolol tartrate  25 mg Oral BID  . pantoprazole  40 mg Oral Daily  . rosuvastatin  40 mg Oral Daily  . sertraline  50 mg Oral QHS  . sodium chloride flush  10-40 mL Intracatheter Q12H  . sodium chloride flush  3 mL Intravenous Q12H  . tamsulosin  0.4 mg Oral QHS   Continuous Infusions: . sodium chloride    . amiodarone 60 mg/hr (10/21/19 0750)  . lactated ringers Stopped (10/17/19 2122)  . milrinone 0.125 mcg/kg/min (10/21/19 0750)  . potassium chloride 10 mEq (10/21/19 0756)   PRN Meds:.metoprolol tartrate, morphine injection, ondansetron (ZOFRAN) IV, oxyCODONE, promethazine, sodium chloride flush, sodium chloride flush, sodium chloride flush, traMADol  Physical Exam: General appearance: alert, cooperative and no distress Neurologic: intact Heart: Afib, VR 100-120 Lungs: Breath sounds are clear anterior.  Abdomen: soft and flat, non-tender Extremities: all warm and well perfused, IABP exit site is soft and hemostatic. Wound: The sternal incision and bilateral EVH incisions are well approximated and dry.  PICC RUE, site OK.   Lab Results: CBC: Recent Labs    10/20/19 0309 10/21/19 0346  WBC 14.3* 10.8*  HGB 9.6* 9.0*  HCT 30.3* 27.8*  PLT 77* 89*   BMET:  Recent Labs    10/20/19 0309 10/21/19 0346  NA 137 137  K 4.2 3.6  CL 105 103  CO2 24 26  GLUCOSE 126* 93  BUN 20 25*  CREATININE 1.40* 1.22  CALCIUM 7.9* 7.6*    CMET: Lab Results  Component Value Date   WBC 10.8 (H) 10/21/2019   HGB 9.0 (L) 10/21/2019   HCT 27.8 (L) 10/21/2019   PLT 89 (L) 10/21/2019   GLUCOSE 93 10/21/2019   CHOL 113 10/17/2019   TRIG 48 10/17/2019   HDL 45 10/17/2019   LDLCALC 58 10/17/2019   ALT 12 10/17/2019   AST 29 10/17/2019   NA 137 10/21/2019   K 3.6 10/21/2019   CL 103 10/21/2019   CREATININE 1.22 10/21/2019   BUN 25 (H) 10/21/2019   CO2 26 10/21/2019   PSA 2.71 01/17/2016   INR 1.6 (H) 10/17/2019   HGBA1C 5.7 (H) 10/17/2019      PT/INR: No results for  input(s): LABPROT, INR in the last 72 hours. Radiology: [facility] Chest Port 1 View  Result Date: 10/21/2019 CLINICAL DATA:  Congestive heart failure. EXAM: PORTABLE CHEST 1 VIEW COMPARISON:  10/20/2019 FINDINGS: Right-sided PICC line unchanged. Patient is slightly rotated to the right. Lungs are adequately inflated demonstrate persistent small bilateral pleural effusions likely with associated bibasilar atelectasis. Mild hazy prominence of the perihilar vessels suggesting a degree of vascular congestion. Stable cardiomegaly. Remainder of the exam is unchanged. IMPRESSION: 1. Stable cardiomegaly with suggestion of mild vascular congestion. Small stable bilateral pleural effusions likely with associated basilar atelectasis. 1. Right-sided PICC line unchanged. Electronically Signed   By: Marin Olp M.D.   On: 10/21/2019 07:40     Assessment/Plan: S/P Procedure(s) (LRB): CORONARY ARTERY BYPASS GRAFTING (CABG) using LIMA to LAD; Endoscopic harvest right greater saphenous vein: SVG to RAMUS. (N/A) Transesophageal Echocardiogram (Tee) Clipping Of Atrial Appendage using AtriCure [id number] 45 MM AtriClip. (N/A) Endovein Harvest Of Greater Saphenous Vein (Right)  -POD-4 emergency CABG x 2 after presenting with acute STEMI. IABP removed POD-2. Stable hemodynamics. On milrinone 0.22mcg/kg/min this am, decreasing to 0.140mcg/kg/min today, mgt per HF team.    -Post-op afib- converted back to SR on IV amiodarone but back in AF this am. Amio re-bolused and increased to 60mg /hr. correcting K+, check Mg++ today. P planning to discharge on Eliquis and ASA 81mg . Plavix discontinued today.   --Nausea- improved.  -Expected ABL anemia- Hct acceptable, monitor.   -Volume excess- Wt still + by 7kg but does not appear overloaded by exam. Stop Lasix for now.   -DVT PPX-On daily enoxaparin.   -History of lupus- Imuran, Plaquenil  resumed.    Malon Kindle [id number] 10/21/2019 8:04 AM   I have  seen and examined the patient and agree with the assessment and plan as outlined.  Rexene Alberts, MD 10/21/2019 9:02 AM

## 2019-10-21 NOTE — Progress Notes (Addendum)
Patient ID: Cody Arellano, male   DOB: 1946-02-14, 74 y.o.   MRN: [id number]     Advanced Heart Failure Rounding Note  PCP-Cardiologist: Mertie Moores, MD   Subjective:    Anterior STEMI at admission, LHC with occluded ostial LAD (in-stent), 90% ostial ramus, 60-70% proximal LCx, nondominant RCA.  PTCA to LAD and ramus to restore flow, then CABG with LIMA-LAD and SVG-ramus. Echo pre-op with EF 65-70%, normal RV function, bicuspid AoV with mild AS and mild AI.   IABP removed 5/17.    Post op recovery c/b PAF. He is back in Afib today. Rates 90s-110s.   Remains on milrinone 0.25, amiodarone gtt 30.  Co-ox pending.   CVP 10-11. -1.9L in UOP yesterday. Wt down additional 4 lb. Remains ~16 lb above preop wt.   Objective:   Weight Range: 84.4 kg Body mass index is 26.7 kg/m.   Vital Signs:   Temp:  [97.9 F (36.6 C)-98.9 F (37.2 C)] 97.9 F (36.6 C) (05/19 0408) Pulse Rate:  [65-141] 141 (05/19 0630) Resp:  [14-32] 25 (05/19 0630) BP: (82-119)/(50-88) 107/67 (05/19 0630) SpO2:  [87 %-99 %] 87 % (05/19 0630) Weight:  [84.4 kg] 84.4 kg (05/19 0600)    Weight change: Filed Weights   10/19/19 0358 10/20/19 0600 10/21/19 0600  Weight: 88.9 kg 86.2 kg 84.4 kg    Intake/Output:   Intake/Output Summary (Last 24 hours) at 10/21/2019 0711 Last data filed at 10/21/2019 0700 Gross per 24 hour  Intake 699.85 ml  Output 1945 ml  Net -1245.15 ml      Physical Exam    PHYSICAL EXAM: CVP 10-11 General:  Well appearing AAM, sitting up in chair. No respiratory difficulty HEENT: normal Neck: supple.  JVD 10 cm. Carotids 2+ bilat; no bruits. No lymphadenopathy or thyromegaly appreciated. Cor: PMI nondisplaced. Irregular rhythm, tachy rate. No rubs, gallops or murmurs. Lungs: clear Abdomen: soft, nontender, nondistended. No hepatosplenomegaly. No bruits or masses. Good bowel sounds. Extremities: no cyanosis, clubbing, rash, edema + SCDs Neuro: alert & oriented x 3, cranial nerves  grossly intact. moves all 4 extremities w/o difficulty. Affect pleasant. GU: + Foley     Telemetry   Afib w/ RVR 90s- 110s  Labs    CBC Recent Labs    10/20/19 0309 10/21/19 0346  WBC 14.3* 10.8*  NEUTROABS 12.2* 8.6*  HGB 9.6* 9.0*  HCT 30.3* 27.8*  MCV 101.0* 99.6  PLT 77* 89*   Basic Metabolic Panel Recent Labs    10/18/19 1702 10/19/19 0318 10/20/19 0309 10/21/19 0346  NA 139   < > 137 137  K 4.6   < > 4.2 3.6  CL 110   < > 105 103  CO2 19*   < > 24 26  GLUCOSE 132*   < > 126* 93  BUN 11   < > 20 25*  CREATININE 1.06   < > 1.40* 1.22  CALCIUM 7.7*   < > 7.9* 7.6*  MG 2.1  --   --   --    < > = values in this interval not displayed.   Liver Function Tests No results for input(s): AST, ALT, ALKPHOS, BILITOT, PROT, ALBUMIN in the last 72 hours. No results for input(s): LIPASE, AMYLASE in the last 72 hours. Cardiac Enzymes No results for input(s): CKTOTAL, CKMB, CKMBINDEX, TROPONINI in the last 72 hours.  BNP: BNP (last 3 results) No results for input(s): BNP in the last 8760 hours.  ProBNP (last 3 results) No results  for input(s): PROBNP in the last 8760 hours.   D-Dimer No results for input(s): DDIMER in the last 72 hours. Hemoglobin A1C No results for input(s): HGBA1C in the last 72 hours. Fasting Lipid Panel No results for input(s): CHOL, HDL, LDLCALC, TRIG, CHOLHDL, LDLDIRECT in the last 72 hours. Thyroid Function Tests No results for input(s): TSH, T4TOTAL, T3FREE, THYROIDAB in the last 72 hours.  Invalid input(s): FREET3  Other results:   Imaging    No results found.   Medications:     Scheduled Medications: . acetaminophen  1,000 mg Oral Q6H  . aspirin EC  81 mg Oral Daily  . azaTHIOprine  50 mg Oral Daily  . bisacodyl  10 mg Oral Daily   Or  . bisacodyl  10 mg Rectal Daily  . Chlorhexidine Gluconate Cloth  6 each Topical Daily  . Chlorhexidine Gluconate Cloth  6 each Topical Daily  . clopidogrel  75 mg Oral Daily  .  digoxin  0.25 mg Oral Daily  . docusate sodium  200 mg Oral Daily  . enoxaparin (LOVENOX) injection  30 mg Subcutaneous QHS  . hydroxychloroquine  200 mg Oral QHS  . insulin aspart  0-24 Units Subcutaneous Q4H  . metoprolol tartrate  25 mg Oral BID  . pantoprazole  40 mg Oral Daily  . rosuvastatin  40 mg Oral Daily  . sertraline  50 mg Oral QHS  . sodium chloride flush  10-40 mL Intracatheter Q12H  . sodium chloride flush  3 mL Intravenous Q12H  . tamsulosin  0.4 mg Oral QHS    Infusions: . sodium chloride    . amiodarone 30 mg/hr (10/21/19 0700)  . lactated ringers Stopped (10/17/19 2122)  . milrinone 0.25 mcg/kg/min (10/21/19 0700)  . potassium chloride 50 mL/hr at 10/21/19 0700    PRN Medications: metoprolol tartrate, morphine injection, ondansetron (ZOFRAN) IV, oxyCODONE, promethazine, sodium chloride flush, sodium chloride flush, sodium chloride flush, traMADol   Assessment/Plan   1. CAD: Prior history of MI with LAD and ramus PCI.  Admitted with anterior STEMI. LHC with occluded ostial LAD (in-stent), 90% ostial ramus, 60-70% proximal LCx, nondominant RCA.  PTCA to LAD and ramus to restore flow, then CABG with LIMA-LAD and SVG-ramus.  - Continue ASA, statin.  2. Acute systolic CHF: Post-op echo with EF 45-50%, septal hypokinesis, bicuspid AoV with mild AS and mild AI.  He is volume overloaded on exam, CVP 10. Remains ~16 lb above preopt wt.  IABP out, he remains on milrinone 0.25. Co-ox not drawn yet. Will order and continue milrinone wean as tolerated - Continue IV Lasix 40 mg bid  - Continue milrinone 0.25 mcg/kg/min. If co-ox ok, will try to wean 3. Atrial fibrillation: Paroxysmal post-op.  Back in Afib on amiodarone gtt.  - can rebolus and increase rate to 60 mg/hr if needed - continue digoxin and metoprolol  - not on a/c currently post CABG. If AF persist, may need a/c.  4. SLE: History of SLE pericarditis.  He is back on home Plaquenil and Imuran.  5. Bicuspid aortic  valve: Mild AS and mild AI.  6. AKI: SCr trending down, 1.40>>1.22 - continue to monitor w/ diuresis   Length of Stay: [address], PA-C  10/21/2019, 7:11 AM  Advanced Heart Failure Team Pager [phone] (M-F; [place])  Please contact [facility] for night-coverage after hours (4p -7a ) and weekends on [email or web address]  Patient seen with PA, agree with the above note.  He went back  into atrial fibrillation with RVR this morning, bolused with amiodarone and has been back and forth to NSR.    CVP 7, no co-ox today.   General: NAD Neck: JVP 8 cm, no thyromegaly or thyroid nodule.  Lungs: Clear to auscultation bilaterally with normal respiratory effort. CV: Nondisplaced PMI.  Heart tachy, irregular S1/S2, no S3/S4, no murmur.  No peripheral edema.   Abdomen: Soft, nontender, no hepatosplenomegaly, no distention.  Skin: Intact without lesions or rashes.  Neurologic: Alert and oriented x 3.  Psych: Normal affect. Extremities: No clubbing or cyanosis.  HEENT: Normal.   CVP 7, with atrial fibrillation will hold Lasix today and replace K.   Send co-ox, for now will decrease milrinone to 0.125.   Recurrent atrial fibrillation, bolus amiodarone and increase gtt to 60 mg/hr.  Will hold Plavix and plan on starting Eliquis when closer to discharge.   CRITICAL CARE Performed by: Loralie Champagne  Total critical care time: 35 minutes  Critical care time was exclusive of separately billable procedures and treating other patients.  Critical care was necessary to treat or prevent imminent or life-threatening deterioration.  Critical care was time spent personally by me on the following activities: development of treatment plan with patient and/or surrogate as well as nursing, discussions with consultants, evaluation of patient's response to treatment, examination of patient, obtaining history from patient or surrogate, ordering and performing treatments and interventions, ordering and  review of laboratory studies, ordering and review of radiographic studies, pulse oximetry and re-evaluation of patient's condition.   Loralie Champagne 10/21/2019 8:00 AM

## 2019-10-21 NOTE — Progress Notes (Signed)
Pt converted to NSR approx 8:37.  See chart review-media for strip review.  Kathleene Hazel RN

## 2019-10-22 DIAGNOSIS — I5043 Acute on chronic combined systolic (congestive) and diastolic (congestive) heart failure: Secondary | ICD-10-CM

## 2019-10-22 LAB — GLUCOSE, CAPILLARY
Glucose-Capillary: 100 mg/dL — ABNORMAL HIGH (ref 70–99)
Glucose-Capillary: 107 mg/dL — ABNORMAL HIGH (ref 70–99)
Glucose-Capillary: 111 mg/dL — ABNORMAL HIGH (ref 70–99)
Glucose-Capillary: 120 mg/dL — ABNORMAL HIGH (ref 70–99)
Glucose-Capillary: 93 mg/dL (ref 70–99)

## 2019-10-22 LAB — BASIC METABOLIC PANEL
Anion gap: 12 (ref 5–15)
BUN: 29 mg/dL — ABNORMAL HIGH (ref 8–23)
CO2: 24 mmol/L (ref 22–32)
Calcium: 7.7 mg/dL — ABNORMAL LOW (ref 8.9–10.3)
Chloride: 100 mmol/L (ref 98–111)
Creatinine, Ser: 1.21 mg/dL (ref 0.61–1.24)
GFR calc Af Amer: >60 mL/min (ref >60)
GFR calc non Af Amer: 59 mL/min — ABNORMAL LOW (ref >60)
Glucose, Bld: 178 mg/dL — ABNORMAL HIGH (ref 70–99)
Potassium: 4.1 mmol/L (ref 3.5–5.1)
Sodium: 136 mmol/L (ref 135–145)

## 2019-10-22 LAB — CBC WITH DIFFERENTIAL/PLATELET
Abs Immature Granulocytes: 0.07 10*3/uL (ref 0.00–0.07)
Basophils Absolute: 0 10*3/uL (ref 0.0–0.1)
Basophils Relative: 0 %
Eosinophils Absolute: 0.1 10*3/uL (ref 0.0–0.5)
Eosinophils Relative: 1 %
HCT: 27 % — ABNORMAL LOW (ref 39.0–52.0)
Hemoglobin: 8.7 g/dL — ABNORMAL LOW (ref 13.0–17.0)
Immature Granulocytes: 1 %
Lymphocytes Relative: 7 %
Lymphs Abs: 0.7 10*3/uL (ref 0.7–4.0)
MCH: 32.3 pg (ref 26.0–34.0)
MCHC: 32.2 g/dL (ref 30.0–36.0)
MCV: 100.4 fL — ABNORMAL HIGH (ref 80.0–100.0)
Monocytes Absolute: 1.2 10*3/uL — ABNORMAL HIGH (ref 0.1–1.0)
Monocytes Relative: 12 %
Neutro Abs: 8.1 10*3/uL — ABNORMAL HIGH (ref 1.7–7.7)
Neutrophils Relative %: 79 %
Platelets: 118 10*3/uL — ABNORMAL LOW (ref 150–400)
RBC: 2.69 MIL/uL — ABNORMAL LOW (ref 4.22–5.81)
RDW: 13.6 % (ref 11.5–15.5)
WBC: 10.2 10*3/uL (ref 4.0–10.5)
nRBC: 0.8 % — ABNORMAL HIGH (ref 0.0–0.2)

## 2019-10-22 LAB — COOXEMETRY PANEL
Carboxyhemoglobin: 1.3 % (ref 0.5–1.5)
Carboxyhemoglobin: 1.3 % (ref 0.5–1.5)
Methemoglobin: 1 % (ref 0.0–1.5)
Methemoglobin: 1.1 % (ref 0.0–1.5)
O2 Saturation: 48.8 %
O2 Saturation: 53.3 %
Total hemoglobin: 9.7 g/dL — ABNORMAL LOW (ref 12.0–16.0)
Total hemoglobin: 8.9 g/dL — ABNORMAL LOW (ref 12.0–16.0)

## 2019-10-22 MED ORDER — SODIUM CHLORIDE 0.9% FLUSH
3.0000 mL | Freq: Two times a day (BID) | INTRAVENOUS | Status: DC
  Administered 2019-10-22 – 2019-10-26 (×9): 3 mL via INTRAVENOUS

## 2019-10-22 MED ORDER — ~~LOC~~ CARDIAC SURGERY, PATIENT & FAMILY EDUCATION: Freq: Once | Status: AC

## 2019-10-22 MED ORDER — AMIODARONE LOAD VIA INFUSION
150.0000 mg | Freq: Once | INTRAVENOUS | Status: AC
  Administered 2019-10-22: 150 mg via INTRAVENOUS

## 2019-10-22 MED ORDER — SODIUM CHLORIDE 0.9 % IV SOLN: 250.0000 mL | INTRAVENOUS | Status: DC | PRN

## 2019-10-22 MED ORDER — AMIODARONE HCL IN DEXTROSE 360-4.14 MG/200ML-% IV SOLN
30.0000 mg/h | INTRAVENOUS | Status: DC
  Administered 2019-10-23 – 2019-10-25 (×7): 30 mg/h via INTRAVENOUS
  Filled 2019-10-22 (×7): qty 200

## 2019-10-22 MED ORDER — DIGOXIN 125 MCG PO TABS
0.1250 mg | ORAL_TABLET | Freq: Every day | ORAL | Status: DC
  Administered 2019-10-22 – 2019-10-27 (×6): 0.125 mg via ORAL
  Filled 2019-10-22 (×6): qty 1

## 2019-10-22 MED ORDER — AMIODARONE HCL IN DEXTROSE 360-4.14 MG/200ML-% IV SOLN
60.0000 mg/h | INTRAVENOUS | Status: DC
  Administered 2019-10-22 (×2): 60 mg/h via INTRAVENOUS
  Filled 2019-10-22: qty 200

## 2019-10-22 MED ORDER — FUROSEMIDE 10 MG/ML IJ SOLN
40.0000 mg | Freq: Once | INTRAMUSCULAR | Status: AC
  Administered 2019-10-22: 40 mg via INTRAVENOUS
  Filled 2019-10-22: qty 4

## 2019-10-22 MED ORDER — AMIODARONE HCL 200 MG PO TABS
200.0000 mg | ORAL_TABLET | Freq: Two times a day (BID) | ORAL | Status: DC
  Administered 2019-10-22 (×2): 200 mg via ORAL
  Filled 2019-10-22 (×2): qty 1

## 2019-10-22 MED ORDER — SODIUM CHLORIDE 0.9% FLUSH: 3.0000 mL | INTRAVENOUS | Status: DC | PRN

## 2019-10-22 MED ORDER — AMIODARONE HCL IN DEXTROSE 360-4.14 MG/200ML-% IV SOLN
INTRAVENOUS | Status: AC
  Filled 2019-10-22: qty 400

## 2019-10-22 MED FILL — Lidocaine HCl Local Soln Prefilled Syringe 100 MG/5ML (2%): INTRAMUSCULAR | Qty: 5 | Status: AC

## 2019-10-22 MED FILL — Sodium Bicarbonate IV Soln 8.4%: INTRAVENOUS | Qty: 50 | Status: AC

## 2019-10-22 MED FILL — Sodium Chloride IV Soln 0.9%: INTRAVENOUS | Qty: 2000 | Status: AC

## 2019-10-22 MED FILL — Electrolyte-R (PH 7.4) Solution: INTRAVENOUS | Qty: 4000 | Status: AC

## 2019-10-22 MED FILL — Mannitol IV Soln 20%: INTRAVENOUS | Qty: 500 | Status: AC

## 2019-10-22 NOTE — Progress Notes (Signed)
Patient ID: Cody Arellano, male   DOB: 1945-09-27, 74 y.o.   MRN: [id number]     Advanced Heart Failure Rounding Note  PCP-Cardiologist: Mertie Moores, MD   Subjective:    Anterior STEMI at admission, LHC with occluded ostial LAD (in-stent), 90% ostial ramus, 60-70% proximal LCx, nondominant RCA.  PTCA to LAD and ramus to restore flow, then CABG with LIMA-LAD and SVG-ramus. Echo pre-op with EF 65-70%, normal RV function, bicuspid AoV with mild AS and mild AI.   IABP removed 5/17.  Post-op echo with EF 45-50%, septal hypokinesis.   Post op recovery c/b PAF. He is now in NSR on amiodarone gtt 60 mg/hr.    CVP 11-12. No Lasix yesterday.  Weight above pre-op.  Co-ox 49% this morning on milrinone 0.125.   This morning, he got up to walk.  Did well for one lap then tried another lap and became hypotensive/presyncopal, BP 75/50.  Back to bed, now SBP in 110s.   Objective:   Weight Range: 85 kg Body mass index is 26.89 kg/m.   Vital Signs:   Temp:  [97.8 F (36.6 C)-98.6 F (37 C)] 97.8 F (36.6 C) (05/20 0400) Pulse Rate:  [65-91] 66 (05/20 0700) Resp:  [15-29] 19 (05/20 0700) BP: (75-121)/(51-78) 113/70 (05/20 0700) SpO2:  [88 %-97 %] 95 % (05/20 0700) Weight:  [85 kg] 85 kg (05/20 0500) Last BM Date: 10/21/19  Weight change: Filed Weights   10/20/19 0600 10/21/19 0600 10/22/19 0500  Weight: 86.2 kg 84.4 kg 85 kg    Intake/Output:   Intake/Output Summary (Last 24 hours) at 10/22/2019 0729 Last data filed at 10/22/2019 0400 Gross per 24 hour  Intake 1091.88 ml  Output 400 ml  Net 691.88 ml      Physical Exam    CVP 11-12 General: NAD Neck: JVP 9-10 cm, no thyromegaly or thyroid nodule.  Lungs: Clear to auscultation bilaterally with normal respiratory effort. CV: Nondisplaced PMI.  Heart regular S1/S2, no S3/S4, no murmur.  No peripheral edema.  Abdomen: Soft, nontender, no hepatosplenomegaly, no distention.  Skin: Intact without lesions or rashes.  Neurologic: Alert  and oriented x 3.  Psych: Normal affect. Extremities: No clubbing or cyanosis.  HEENT: Normal.    Telemetry   NSR 60s (personally reviewed)  Labs    CBC Recent Labs    10/21/19 0346 10/22/19 0500  WBC 10.8* 10.2  NEUTROABS 8.6* 8.1*  HGB 9.0* 8.7*  HCT 27.8* 27.0*  MCV 99.6 100.4*  PLT 89* 123456*   Basic Metabolic Panel Recent Labs    10/21/19 0346 10/21/19 0830 10/22/19 0500  NA 137  --  136  K 3.6  --  4.1  CL 103  --  100  CO2 26  --  24  GLUCOSE 93  --  178*  BUN 25*  --  29*  CREATININE 1.22  --  1.21  CALCIUM 7.6*  --  7.7*  MG  --  1.9  --    Liver Function Tests No results for input(s): AST, ALT, ALKPHOS, BILITOT, PROT, ALBUMIN in the last 72 hours. No results for input(s): LIPASE, AMYLASE in the last 72 hours. Cardiac Enzymes No results for input(s): CKTOTAL, CKMB, CKMBINDEX, TROPONINI in the last 72 hours.  BNP: BNP (last 3 results) No results for input(s): BNP in the last 8760 hours.  ProBNP (last 3 results) No results for input(s): PROBNP in the last 8760 hours.   D-Dimer No results for input(s): DDIMER in the last 72  hours. Hemoglobin A1C No results for input(s): HGBA1C in the last 72 hours. Fasting Lipid Panel No results for input(s): CHOL, HDL, LDLCALC, TRIG, CHOLHDL, LDLDIRECT in the last 72 hours. Thyroid Function Tests No results for input(s): TSH, T4TOTAL, T3FREE, THYROIDAB in the last 72 hours.  Invalid input(s): FREET3  Other results:   Imaging    No results found.   Medications:     Scheduled Medications: . acetaminophen  1,000 mg Oral Q6H  . amiodarone  200 mg Oral BID  . aspirin EC  81 mg Oral Daily  . azaTHIOprine  50 mg Oral Daily  . bisacodyl  10 mg Oral Daily   Or  . bisacodyl  10 mg Rectal Daily  . Chlorhexidine Gluconate Cloth  6 each Topical Daily  . Chlorhexidine Gluconate Cloth  6 each Topical Daily  . digoxin  0.125 mg Oral Daily  . docusate sodium  200 mg Oral Daily  . enoxaparin (LOVENOX)  injection  30 mg Subcutaneous QHS  . furosemide  40 mg Intravenous Once  . hydroxychloroquine  200 mg Oral QHS  . metoCLOPramide (REGLAN) injection  10 mg Intravenous Q6H  . pantoprazole  40 mg Oral Daily  . rosuvastatin  40 mg Oral Daily  . sertraline  50 mg Oral QHS  . sodium chloride flush  10-40 mL Intracatheter Q12H  . sodium chloride flush  3 mL Intravenous Q12H  . tamsulosin  0.4 mg Oral QHS    Infusions: . sodium chloride    . lactated ringers Stopped (10/17/19 2122)  . milrinone 0.125 mcg/kg/min (10/22/19 0400)    PRN Medications: metoprolol tartrate, morphine injection, ondansetron (ZOFRAN) IV, oxyCODONE, promethazine, sodium chloride flush, sodium chloride flush, sodium chloride flush, traMADol   Assessment/Plan   1. CAD: Prior history of MI with LAD and ramus PCI.  Admitted with anterior STEMI. LHC with occluded ostial LAD (in-stent), 90% ostial ramus, 60-70% proximal LCx, nondominant RCA.  PTCA to LAD and ramus to restore flow, then CABG with LIMA-LAD and SVG-ramus.  - Continue ASA, statin.  2. Acute systolic CHF: Post-op echo with EF 45-50%, septal hypokinesis, bicuspid AoV with mild AS and mild AI.  IABP out, he remains on milrinone 0.125.  Co-ox early am low at 49%.  CVP 11-12 today, stable creatinine.  Had presyncopal episode when walking earlier today.  - Will give Lasix 40 mg IV x 1, will not be overly aggressive with Lasix today given presyncopal episode.  - Continue milrinone 0.125 mcg/kg/min and digoxin.  Will not wean milrinone off with low co-ox, repeat co-ox now that he is awake/alert.  - Hold metoprolol with presyncopal episode.  3. Atrial fibrillation: Paroxysmal post-op.  Now in NSR on amiodarone gtt 60 mg/hr.  - Stop amiodarone gtt, start amiodarone 200 mg bid.  - Plan for Eliquis initiation closer to discharge.  4. SLE: History of SLE pericarditis.  He is back on home Plaquenil and Imuran.  5. Bicuspid aortic valve: Mild AS and mild AI.  6. AKI:  Creatinine down to 1.2.  - continue to monitor w/ diuresis   CRITICAL CARE Performed by: Loralie Champagne  Total critical care time: 35 minutes  Critical care time was exclusive of separately billable procedures and treating other patients.  Critical care was necessary to treat or prevent imminent or life-threatening deterioration.  Critical care was time spent personally by me on the following activities: development of treatment plan with patient and/or surrogate as well as nursing, discussions with consultants, evaluation of patient's  response to treatment, examination of patient, obtaining history from patient or surrogate, ordering and performing treatments and interventions, ordering and review of laboratory studies, ordering and review of radiographic studies, pulse oximetry and re-evaluation of patient's condition.   Length of Stay: [place], MD  10/22/2019, 7:29 AM  Advanced Heart Failure Team Pager [phone] (M-F; 7a - 4p)  Please contact [facility] for night-coverage after hours (4p -7a ) and weekends on [email or web address]

## 2019-10-22 NOTE — Progress Notes (Signed)
CARDIAC REHAB PHASE I   PRE:  Rate/Rhythm: 109 afib, some NSR    BP: sitting 115/72, standing 95/79    SaO2: 96 4L  MODE:  Ambulation: 470 ft   POST:  Rate/Rhythm: 127 afib    BP: sitting 121/78     SaO2: 91 4L  Pt eager to walk. Some difficulty moving hips to edge of chair. Stood with rocking. Took standing BP which was slightly lower but no sx. Followed with chair during walk but no dizziness or presyncope. Slow and steady pace with RW and 4L O2, assist x2 for equipment. Remained mostly in afib. To bed after walk. No major c/o.  Encouraged another walk and IS. [place], ACSM 10/22/2019 3:20 PM

## 2019-10-22 NOTE — Progress Notes (Signed)
During AM walk, patient vagaled briefly, lost balance, and decreased LOC. BP 75/60(66). Sats 94% on 6L [place]. Sat patient in chair and then had regained appropriate consciousness. Repeat BP 96/61(72). Pt transported back to room in roller chair with RN x2.   Pt now alert and responsive, able to recall episode. Placed back in bed following incident. Following commands equally and bilaterally. BP now reading 121/72.

## 2019-10-22 NOTE — Progress Notes (Addendum)
[facility] DAILY ICU PROGRESS NOTE                   [address]            [place] [address]          [phone]   5 Days Post-Op Procedure(s) (LRB): CORONARY ARTERY BYPASS GRAFTING (CABG) using LIMA to LAD; Endoscopic harvest right greater saphenous vein: SVG to RAMUS. (N/A) Transesophageal Echocardiogram (Tee) Clipping Of Atrial Appendage using AtriCure [id number] 45 MM AtriClip. (N/A) Endovein Harvest Of Greater Saphenous Vein (Right)  Total Length of Stay:  LOS: 5 days   Subjective: Awake and alert, walked over a full lap in the unit this morning. C/O some dizziness afterward but fees better now. Minimal appetite so far but having less nausea.. Pain controlled.   Objective: Vital signs in last 24 hours: Temp:  [97.8 F (36.6 C)-98.6 F (37 C)] 98.3 F (36.8 C) (05/20 0754) Pulse Rate:  [65-91] 65 (05/20 0800) Cardiac Rhythm: Normal sinus rhythm (05/20 0800) Resp:  [15-29] 19 (05/20 0800) BP: (75-121)/(51-78) 118/72 (05/20 0800) SpO2:  [88 %-97 %] 95 % (05/20 0800) Weight:  [85 kg] 85 kg (05/20 0500)  Filed Weights   10/20/19 0600 10/21/19 0600 10/22/19 0500  Weight: 86.2 kg 84.4 kg 85 kg    Weight change: 0.6 kg   Hemodynamic parameters for last 24 hours: CVP:  [12 mmHg] 12 mmHg  Intake/Output from previous day: 05/19 0701 - 05/20 0700 In: 1091.9 [P.O.:50; I.V.:756.8; IV Piggyback:285.1] Out: 400 [Urine:400]  Intake/Output this shift: Total I/O In: 146.5 [I.V.:146.5] Out: -   Current Meds: Scheduled Meds: . acetaminophen  1,000 mg Oral Q6H  . amiodarone  200 mg Oral BID  . aspirin EC  81 mg Oral Daily  . azaTHIOprine  50 mg Oral Daily  . bisacodyl  10 mg Oral Daily   Or  . bisacodyl  10 mg Rectal Daily  . Chlorhexidine Gluconate Cloth  6 each Topical Daily  . Chlorhexidine Gluconate Cloth  6 each Topical Daily  . digoxin  0.125 mg Oral Daily  . docusate sodium  200 mg Oral Daily  . enoxaparin (LOVENOX) injection  30 mg Subcutaneous QHS    . hydroxychloroquine  200 mg Oral QHS  . metoCLOPramide (REGLAN) injection  10 mg Intravenous Q6H  . pantoprazole  40 mg Oral Daily  . rosuvastatin  40 mg Oral Daily  . sertraline  50 mg Oral QHS  . sodium chloride flush  10-40 mL Intracatheter Q12H  . sodium chloride flush  3 mL Intravenous Q12H  . tamsulosin  0.4 mg Oral QHS   Continuous Infusions: . sodium chloride    . lactated ringers Stopped (10/17/19 2122)  . milrinone 0.125 mcg/kg/min (10/22/19 0800)   PRN Meds:.metoprolol tartrate, morphine injection, ondansetron (ZOFRAN) IV, oxyCODONE, promethazine, sodium chloride flush, sodium chloride flush, sodium chloride flush, traMADol  Physical Exam: General appearance:alert, cooperative and no distress Neurologic:intact Heart:SR, no further AF since conversion yesterday AM. Lungs:Breath sounds are clear anterior. Abdomen:soft , mild distension, non-tender Extremities:all warm and well perfused Wound:The sternal incision and bilateral EVH incisions are well approximated and dry.  PICC RUE, site OK.   Lab Results: CBC: Recent Labs    10/21/19 0346 10/22/19 0500  WBC 10.8* 10.2  HGB 9.0* 8.7*  HCT 27.8* 27.0*  PLT 89* 118*   BMET:  Recent Labs    10/21/19 0346 10/22/19 0500  NA 137 136  K 3.6 4.1  CL 103 100  CO2 26 24  GLUCOSE 93 178*  BUN 25* 29*  CREATININE 1.22 1.21  CALCIUM 7.6* 7.7*    CMET: Lab Results  Component Value Date   WBC 10.2 10/22/2019   HGB 8.7 (L) 10/22/2019   HCT 27.0 (L) 10/22/2019   PLT 118 (L) 10/22/2019   GLUCOSE 178 (H) 10/22/2019   CHOL 113 10/17/2019   TRIG 48 10/17/2019   HDL 45 10/17/2019   LDLCALC 58 10/17/2019   ALT 12 10/17/2019   AST 29 10/17/2019   NA 136 10/22/2019   K 4.1 10/22/2019   CL 100 10/22/2019   CREATININE 1.21 10/22/2019   BUN 29 (H) 10/22/2019   CO2 24 10/22/2019   PSA 2.71 01/17/2016   INR 1.6 (H) 10/17/2019   HGBA1C 5.7 (H) 10/17/2019      PT/INR: No results for input(s): LABPROT,  INR in the last 72 hours. Radiology: No results found.   Assessment/Plan: S/P Procedure(s) (LRB): CORONARY ARTERY BYPASS GRAFTING (CABG) using LIMA to LAD; Endoscopic harvest right greater saphenous vein: SVG to RAMUS. (N/A) Transesophageal Echocardiogram (Tee) Clipping Of Atrial Appendage using AtriCure [id number] 45 MM AtriClip. (N/A) Endovein Harvest Of Greater Saphenous Vein (Right)   -POD-5 emergency CABG x 2 after presenting with acute STEMI. IABP removed POD-2. Hypotension with dizziness while walking this AM. On milrinone 0.152mcg/kg/min this am, CoOx 49 today, HF team planning to continue milrinone for another 24 hours..   -Post-op afib- converted back to SR on IV amiodarone, will be converted to PO today. K+ and Mg++ corrected.   Planning to discharge on Eliquis and ASA 81mg .   --Nausea- improved.  -Expected ABL anemia- Hct acceptable, monitor.   -Volume excess- Wt still + by 7kg but does not appear overloaded by exam. Stop Lasix for now.   -DVT PPX-On daily enoxaparin.   -History of lupus- Imuran, Plaquenil  resumed.  -Plan transfer to 4E today.    Antony Odea, PA-C [phone] 10/22/2019 8:33 AM   I have seen and examined the patient and agree with the assessment and plan as outlined.  Rexene Alberts, MD 10/22/2019

## 2019-10-22 NOTE — Progress Notes (Signed)
EPW removed per order. Tips intact. VSS.  Pt tolerated well. Will continue to monitor.  Clyde Canterbury, RN

## 2019-10-22 NOTE — Progress Notes (Signed)
Pt received from [place]. VSS. CHG complete. Order for EPW to be removed. Order clarified with Ellwood Handler PA-C, will proceed with pulling EPW.   Clyde Canterbury, RN

## 2019-10-23 ENCOUNTER — Inpatient Hospital Stay (HOSPITAL_COMMUNITY): Payer: Medicare Other

## 2019-10-23 DIAGNOSIS — I480 Paroxysmal atrial fibrillation: Secondary | ICD-10-CM

## 2019-10-23 LAB — CBC WITH DIFFERENTIAL/PLATELET
Abs Immature Granulocytes: 0.09 10*3/uL — ABNORMAL HIGH (ref 0.00–0.07)
Basophils Absolute: 0 10*3/uL (ref 0.0–0.1)
Basophils Relative: 0 %
Eosinophils Absolute: 0.3 10*3/uL (ref 0.0–0.5)
Eosinophils Relative: 3 %
HCT: 28.5 % — ABNORMAL LOW (ref 39.0–52.0)
Hemoglobin: 9.4 g/dL — ABNORMAL LOW (ref 13.0–17.0)
Immature Granulocytes: 1 %
Lymphocytes Relative: 9 %
Lymphs Abs: 0.9 10*3/uL (ref 0.7–4.0)
MCH: 32.9 pg (ref 26.0–34.0)
MCHC: 33 g/dL (ref 30.0–36.0)
MCV: 99.7 fL (ref 80.0–100.0)
Monocytes Absolute: 1.2 10*3/uL — ABNORMAL HIGH (ref 0.1–1.0)
Monocytes Relative: 12 %
Neutro Abs: 7.6 10*3/uL (ref 1.7–7.7)
Neutrophils Relative %: 75 %
Platelets: 178 10*3/uL (ref 150–400)
RBC: 2.86 MIL/uL — ABNORMAL LOW (ref 4.22–5.81)
RDW: 13.5 % (ref 11.5–15.5)
WBC: 10.1 10*3/uL (ref 4.0–10.5)
nRBC: 0.8 % — ABNORMAL HIGH (ref 0.0–0.2)

## 2019-10-23 LAB — BASIC METABOLIC PANEL
Anion gap: 7 (ref 5–15)
BUN: 25 mg/dL — ABNORMAL HIGH (ref 8–23)
CO2: 28 mmol/L (ref 22–32)
Calcium: 7.7 mg/dL — ABNORMAL LOW (ref 8.9–10.3)
Chloride: 102 mmol/L (ref 98–111)
Creatinine, Ser: 1.14 mg/dL (ref 0.61–1.24)
GFR calc Af Amer: >60 mL/min (ref >60)
GFR calc non Af Amer: >60 mL/min (ref >60)
Glucose, Bld: 104 mg/dL — ABNORMAL HIGH (ref 70–99)
Potassium: 3.7 mmol/L (ref 3.5–5.1)
Sodium: 137 mmol/L (ref 135–145)

## 2019-10-23 LAB — COOXEMETRY PANEL
Carboxyhemoglobin: 1 % (ref 0.5–1.5)
Methemoglobin: 0.7 % (ref 0.0–1.5)
O2 Saturation: 61.6 %
Total hemoglobin: 9.2 g/dL — ABNORMAL LOW (ref 12.0–16.0)

## 2019-10-23 LAB — GLUCOSE, CAPILLARY
Glucose-Capillary: 102 mg/dL — ABNORMAL HIGH (ref 70–99)
Glucose-Capillary: 104 mg/dL — ABNORMAL HIGH (ref 70–99)

## 2019-10-23 LAB — MAGNESIUM: Magnesium: 2.3 mg/dL (ref 1.7–2.4)

## 2019-10-23 MED ORDER — POTASSIUM CHLORIDE CRYS ER 20 MEQ PO TBCR
40.0000 meq | EXTENDED_RELEASE_TABLET | Freq: Two times a day (BID) | ORAL | Status: AC
  Administered 2019-10-23 – 2019-10-24 (×3): 40 meq via ORAL
  Filled 2019-10-23 (×3): qty 2

## 2019-10-23 MED ORDER — POTASSIUM CHLORIDE CRYS ER 20 MEQ PO TBCR: 40.0000 meq | EXTENDED_RELEASE_TABLET | Freq: Once | ORAL | Status: DC

## 2019-10-23 MED ORDER — FUROSEMIDE 10 MG/ML IJ SOLN
40.0000 mg | Freq: Once | INTRAMUSCULAR | Status: AC
  Administered 2019-10-23: 40 mg via INTRAVENOUS
  Filled 2019-10-23: qty 4

## 2019-10-23 NOTE — Progress Notes (Signed)
Mobility Specialist: Progress Note    10/23/19 1504  Mobility  Activity Ambulated in hall  Level of Assistance Contact guard assist, steadying assist  Assistive Device Front wheel walker  Distance Ambulated (ft) 430 ft  Mobility Response Tolerated well  Mobility performed by Mobility specialist  Bed Position Chair  $Mobility charge 1 Mobility   Pre-Mobility: 76 HR, 89% SpO2 Post-Mobility: 66 HR, 90%  SpO2  Pt tolerated ambulation well. Chair follow was used with pt but was not needed. Pt says he felt fine throughout ambulation. Pt was positioned back in the chair with family present in the room.   [facility] Day Mobility Specialist

## 2019-10-23 NOTE — Progress Notes (Signed)
CARDIAC REHAB PHASE I   PRE:  Rate/Rhythm: 80 SR  BP:  Supine:   Sitting: 109/68  Standing:    SaO2: 93% 3L  MODE:  Ambulation: 300 ft   POST:  Rate/Rhythm: 125 afib  BP:  Supine:   Sitting: 125/83  Standing:    SaO2: 88- 93% 3L 1004-1040 Pt walked 300 ft on 3L with gait belt use, rolling walker and asst x 2. Had rollator close by in case pt got dizzy or felt lightheaded. Pt in and out of atrial fib. Stopped to rest as needed and slowed pace today. Did not sleep well and is tired. To recliner after walk with call bell. Pt very motivated to walk more today. Did not need to sit during walk.    Graylon Good, RN BSN  10/23/2019 10:37 AM

## 2019-10-23 NOTE — Progress Notes (Addendum)
Patient ID: Cody Arellano, male   DOB: January 26, 1946, 74 y.o.   MRN: [id number]     Advanced Heart Failure Rounding Note  PCP-Cardiologist: Mertie Moores, MD   Subjective:    Anterior STEMI at admission, LHC with occluded ostial LAD (in-stent), 90% ostial ramus, 60-70% proximal LCx, nondominant RCA.  PTCA to LAD and ramus to restore flow, then CABG with LIMA-LAD and SVG-ramus. Echo pre-op with EF 65-70%, normal RV function, bicuspid AoV with mild AS and mild AI.   IABP removed 5/17.  Post-op echo with EF 45-50%, septal hypokinesis.   Post op recovery c/b PAF. He is now in NSR on amiodarone gtt 30 mg/hr.    Yesterday diuresed with IV lasix. I/O not accurate.   CO-OX 62% on milrinone 0.125 mcg.   Denies SOB.    Objective:   Weight Range: 83.8 kg Body mass index is 26.52 kg/m.   Vital Signs:   Temp:  [97.7 F (36.5 C)-98.5 F (36.9 C)] 98.3 F (36.8 C) (05/21 0741) Pulse Rate:  [67-128] 110 (05/21 0741) Resp:  [12-23] 19 (05/21 0741) BP: (115-142)/(64-101) 121/79 (05/21 0741) SpO2:  [91 %-98 %] 93 % (05/21 0741) Weight:  [83.8 kg] 83.8 kg (05/21 0656) Last BM Date: 10/22/19  Weight change: Filed Weights   10/21/19 0600 10/22/19 0500 10/23/19 0656  Weight: 84.4 kg 85 kg 83.8 kg    Intake/Output:   Intake/Output Summary (Last 24 hours) at 10/23/2019 0850 Last data filed at 10/23/2019 0656 Gross per 24 hour  Intake 947.21 ml  Output 1000 ml  Net -52.79 ml      Physical Exam   CVP ~10  General:  Appears weak.  No resp difficulty HEENT: normal Neck: supple. JVP 9-10 . Carotids 2+ bilat; no bruits. No lymphadenopathy or thryomegaly appreciated. Cor: PMI nondisplaced. Irregular rate & rhythm. No rubs, gallops or murmurs. Sternal incision  Lungs: clear on 2 liters  Abdomen: soft, nontender, nondistended. No hepatosplenomegaly. No bruits or masses. Good bowel sounds. Extremities: no cyanosis, clubbing, rash, edema. RUE PICC Neuro: alert & orientedx3, cranial nerves grossly  intact. moves all 4 extremities w/o difficulty. Affect pleasant   Telemetry   A Fib 100-110s   Labs    CBC Recent Labs    10/22/19 0500 10/23/19 0422  WBC 10.2 10.1  NEUTROABS 8.1* 7.6  HGB 8.7* 9.4*  HCT 27.0* 28.5*  MCV 100.4* 99.7  PLT 118* 0000000   Basic Metabolic Panel Recent Labs    10/21/19 0346 10/21/19 0830 10/22/19 0500 10/23/19 0422  NA   < >  --  136 137  K   < >  --  4.1 3.7  CL   < >  --  100 102  CO2   < >  --  24 28  GLUCOSE   < >  --  178* 104*  BUN   < >  --  29* 25*  CREATININE   < >  --  1.21 1.14  CALCIUM   < >  --  7.7* 7.7*  MG  --  1.9  --  2.3   < > = values in this interval not displayed.   Liver Function Tests No results for input(s): AST, ALT, ALKPHOS, BILITOT, PROT, ALBUMIN in the last 72 hours. No results for input(s): LIPASE, AMYLASE in the last 72 hours. Cardiac Enzymes No results for input(s): CKTOTAL, CKMB, CKMBINDEX, TROPONINI in the last 72 hours.  BNP: BNP (last 3 results) No results for input(s): BNP in the  last 8760 hours.  ProBNP (last 3 results) No results for input(s): PROBNP in the last 8760 hours.   D-Dimer No results for input(s): DDIMER in the last 72 hours. Hemoglobin A1C No results for input(s): HGBA1C in the last 72 hours. Fasting Lipid Panel No results for input(s): CHOL, HDL, LDLCALC, TRIG, CHOLHDL, LDLDIRECT in the last 72 hours. Thyroid Function Tests No results for input(s): TSH, T4TOTAL, T3FREE, THYROIDAB in the last 72 hours.  Invalid input(s): FREET3  Other results:   Imaging    DG Chest 2 View  Result Date: 10/23/2019 CLINICAL DATA:  Postop check. EXAM: CHEST - 2 VIEW COMPARISON:  10/21/2019 FINDINGS: Heart is enlarged. Status post CABG and atrial appendage clip. RIGHT-sided PICC line tip unchanged. Stable enlarged cardiac silhouette. There is persistent significant opacity of both lung bases, LEFT greater than RIGHT. LEFT hemidiaphragm is obscured. There is mild pulmonary vascular  congestion and atelectasis. Bilateral pleural effusions. Postoperative changes in the LEFT shoulder. IMPRESSION: Persistent cardiomegaly and bibasilar opacities, LEFT greater than RIGHT. Appearance is stable. Electronically Signed   By: Nolon Nations M.D.   On: 10/23/2019 08:45     Medications:     Scheduled Medications: . amiodarone  200 mg Oral BID  . aspirin EC  81 mg Oral Daily  . azaTHIOprine  50 mg Oral Daily  . bisacodyl  10 mg Oral Daily   Or  . bisacodyl  10 mg Rectal Daily  . Chlorhexidine Gluconate Cloth  6 each Topical Daily  . Chlorhexidine Gluconate Cloth  6 each Topical Daily  . digoxin  0.125 mg Oral Daily  . docusate sodium  200 mg Oral Daily  . enoxaparin (LOVENOX) injection  30 mg Subcutaneous QHS  . hydroxychloroquine  200 mg Oral QHS  . pantoprazole  40 mg Oral Daily  . potassium chloride  40 mEq Oral BID WC  . rosuvastatin  40 mg Oral Daily  . sertraline  50 mg Oral QHS  . sodium chloride flush  10-40 mL Intracatheter Q12H  . sodium chloride flush  3 mL Intravenous Q12H  . tamsulosin  0.4 mg Oral QHS    Infusions: . sodium chloride    . amiodarone 30 mg/hr (10/23/19 0029)  . milrinone 0.125 mcg/kg/min (10/22/19 1700)    PRN Medications: sodium chloride, metoprolol tartrate, ondansetron (ZOFRAN) IV, oxyCODONE, promethazine, sodium chloride flush, sodium chloride flush, traMADol   Assessment/Plan   1. CAD: Prior history of MI with LAD and ramus PCI.  Admitted with anterior STEMI. LHC with occluded ostial LAD (in-stent), 90% ostial ramus, 60-70% proximal LCx, nondominant RCA.  PTCA to LAD and ramus to restore flow, then CABG with LIMA-LAD and SVG-ramus.  - Continue ASA, statin.  2. Acute systolic CHF: Post-op echo with EF 45-50%, septal hypokinesis, bicuspid AoV with mild AS and mild AI.  IABP out, he remains on milrinone 0.125.  CO-OX 62%  - CVP 9-10. Give lasix 40 mg x1. Give 40 meq K.  - Stop milrinone today.  - Hold metoprolol with presyncopal  episode.  3. Atrial fibrillation: Paroxysmal post-op.  Back in Afib 5/20 - Stop po amio. Continue amio drip. - Plan for Eliquis initiation closer to discharge.  4. SLE: History of SLE pericarditis.  He is back on home Plaquenil and Imuran.  5. Bicuspid aortic valve: Mild AS and mild AI.  6. AKI: Creatinine down to 1.14  - continue to monitor w/ diuresis   Length of Stay: 6  Amy Clegg, NP  10/23/2019, 8:50 AM  Advanced Heart Failure Team Pager [phone] (M-F; [place])  Please contact [facility] for night-coverage after hours (4p -7a ) and weekends on [email or web address]  Patient seen with NP, agree with the above note.   He has been in and out of atrial fibrillation.  Was in atrial fibrillation with RVR this morning, went back to NSR as I was talking to her today.  CVP 9-10 cm.  Creatinine stable.   General: NAD Neck: No JVD, no thyromegaly or thyroid nodule.  Lungs: Clear to auscultation bilaterally with normal respiratory effort. CV: Nondisplaced PMI.  Heart regular S1/S2, no S3/S4, no murmur.  No peripheral edema.   Abdomen: Soft, nontender, no hepatosplenomegaly, no distention.  Skin: Intact without lesions or rashes.  Neurologic: Alert and oriented x 3.  Psych: Normal affect. Extremities: No clubbing or cyanosis.  HEENT: Normal.   He is back in NSR currently.  Stop milrinone today with good co-ox.  Continue IV amiodarone gtt for now.  Start Eliquis when going home.   Will give a dose of Lasix 40 mg IV x 1 today and replace K.   Loralie Champagne 10/23/2019 1:59 PM

## 2019-10-23 NOTE — Plan of Care (Signed)
  Problem: Health Behavior/Discharge Planning: Goal: Ability to manage health-related needs will improve Outcome: Progressing   Problem: Clinical Measurements: Goal: Respiratory complications will improve Outcome: Progressing   Problem: Activity: Goal: Risk for activity intolerance will decrease Outcome: Progressing   

## 2019-10-23 NOTE — Progress Notes (Addendum)
6 Days Post-Op Procedure(s) (LRB): CORONARY ARTERY BYPASS GRAFTING (CABG) using LIMA to LAD; Endoscopic harvest right greater saphenous vein: SVG to RAMUS. (N/A) Transesophageal Echocardiogram (Tee) Clipping Of Atrial Appendage using AtriCure PRO245 45 MM AtriClip. (N/A) Endovein Harvest Of Greater Saphenous Vein (Right) Subjective: Awake and alert. No further nausea. Walked several times in the hall yesterday.   Had recurrence of A-fib yesterday and was placed back on IV amiodarone.   Objective: Vital signs in last 24 hours: Temp:  [97.7 F (36.5 C)-98.5 F (36.9 C)] 98.3 F (36.8 C) (05/21 0741) Pulse Rate:  [67-128] 110 (05/21 0741) Cardiac Rhythm: Atrial fibrillation (05/21 0705) Resp:  [12-23] 19 (05/21 0741) BP: (115-142)/(64-101) 121/79 (05/21 0741) SpO2:  [91 %-98 %] 93 % (05/21 0741) Weight:  [83.8 kg] 83.8 kg (05/21 0656)  Hemodynamic parameters for last 24 hours: CVP:  [14 mmHg] 14 mmHg  Intake/Output from previous day: 05/20 0701 - 05/21 0700 In: 1093.7 [P.O.:240; I.V.:853.7] Out: 1000 [Urine:1000] Intake/Output this shift: No intake/output data recorded.  Physical Exam: General appearance:alert, cooperative andnodistress Neurologic:intact Heart:A-fib with VR ~110. Lungs:Breath sounds are clear anterior, O2 sats acceptable on 3Lnc Abdomen:soft , mild distension, non-tender Extremities:all warm and well perfused Wound:Thesternalincisionand bilateral EVH incisions arewell approximated and dry. PICC RUE, site OK.  Lab Results: Recent Labs    10/22/19 0500 10/23/19 0422  WBC 10.2 10.1  HGB 8.7* 9.4*  HCT 27.0* 28.5*  PLT 118* 178   BMET:  Recent Labs    10/22/19 0500 10/23/19 0422  NA 136 137  K 4.1 3.7  CL 100 102  CO2 24 28  GLUCOSE 178* 104*  BUN 29* 25*  CREATININE 1.21 1.14  CALCIUM 7.7* 7.7*    PT/INR: No results for input(s): LABPROT, INR in the last 72 hours. ABG    Component Value Date/Time   PHART 7.371 10/18/2019  0224   HCO3 20.5 10/18/2019 0224   TCO2 22 10/18/2019 0224   ACIDBASEDEF 4.0 (H) 10/18/2019 0224   O2SAT 61.6 10/23/2019 0430   CBG (last 3)  Recent Labs    10/22/19 0749 10/22/19 1121 10/22/19 1622  GLUCAP 120* 93 100*   CLINICAL DATA:  Postop check.  EXAM: CHEST - 2 VIEW  COMPARISON:  10/21/2019  FINDINGS: Heart is enlarged. Status post CABG and atrial appendage clip. RIGHT-sided PICC line tip unchanged.  Stable enlarged cardiac silhouette. There is persistent significant opacity of both lung bases, LEFT greater than RIGHT. LEFT hemidiaphragm is obscured. There is mild pulmonary vascular congestion and atelectasis. Bilateral pleural effusions. Postoperative changes in the LEFT shoulder.  IMPRESSION: Persistent cardiomegaly and bibasilar opacities, LEFT greater than RIGHT. Appearance is stable.   Electronically Signed   By: Nolon Nations M.D.   On: 10/23/2019 08:45  Assessment/Plan: S/P Procedure(s) (LRB): CORONARY ARTERY BYPASS GRAFTING (CABG) using LIMA to LAD; Endoscopic harvest right greater saphenous vein: SVG to RAMUS. (N/A) Transesophageal Echocardiogram (Tee) Clipping Of Atrial Appendage using AtriCure PRO245 45 MM AtriClip. (N/A) Endovein Harvest Of Greater Saphenous Vein (Right)  -POD-6emergency CABG x 2 after presenting with acute STEMI. IABP removed POD-2. Progressing with ambulation.  Remains on milrinone 0.164mcg/kg/min,  CoOx 61.6 today. Milrinone mgt per HF team.   -Post-op afib- converted back to SR on IV amiodarone and was converted to PO amio yesterday. Afib recurrece so he was re-bolused and placed back on the infusion. Remains in AF at 100-120/min. K+ 3.7 today and replacement ordered, Mg++ corrected yestreday. Planning to discharge on Eliquis and ASA 81mg .  --Nausea-  none past 24 hours, tolerating PO's, having BM's.  -Expected ABL anemia- Hctacceptable, monitor.  -Volume excess-resolved.  -DVT PPX-On daily  enoxaparin.   -History of lupus- Imuran, Plaquenilresumed.     LOS: 6 days    Antony Odea, PA-C 10/23/2019   I have seen and examined the patient and agree with the assessment and plan as outlined.  Making slow but steady progress.  Probably can stop milrinone.  Looks euvolemic/dry on exam.  Will defer medical Rx to AHF team.  Plan to start Eliquis at hospital d/c.  Possibly ready for d/c home 2-3 days.  Rexene Alberts, MD 10/23/2019 9:20 AM

## 2019-10-24 DIAGNOSIS — M3213 Lung involvement in systemic lupus erythematosus: Secondary | ICD-10-CM

## 2019-10-24 LAB — BASIC METABOLIC PANEL
Anion gap: 10 (ref 5–15)
BUN: 24 mg/dL — ABNORMAL HIGH (ref 8–23)
CO2: 29 mmol/L (ref 22–32)
Calcium: 7.9 mg/dL — ABNORMAL LOW (ref 8.9–10.3)
Chloride: 99 mmol/L (ref 98–111)
Creatinine, Ser: 1.08 mg/dL (ref 0.61–1.24)
GFR calc Af Amer: >60 mL/min (ref >60)
GFR calc non Af Amer: >60 mL/min (ref >60)
Glucose, Bld: 117 mg/dL — ABNORMAL HIGH (ref 70–99)
Potassium: 4.3 mmol/L (ref 3.5–5.1)
Sodium: 138 mmol/L (ref 135–145)

## 2019-10-24 LAB — GLUCOSE, CAPILLARY
Glucose-Capillary: 92 mg/dL (ref 70–99)
Glucose-Capillary: 108 mg/dL — ABNORMAL HIGH (ref 70–99)
Glucose-Capillary: 112 mg/dL — ABNORMAL HIGH (ref 70–99)
Glucose-Capillary: 105 mg/dL — ABNORMAL HIGH (ref 70–99)

## 2019-10-24 LAB — COOXEMETRY PANEL
Carboxyhemoglobin: 1.7 % — ABNORMAL HIGH (ref 0.5–1.5)
Methemoglobin: 1.1 % (ref 0.0–1.5)
O2 Saturation: 53.3 %
Total hemoglobin: 9.3 g/dL — ABNORMAL LOW (ref 12.0–16.0)

## 2019-10-24 MED ORDER — FUROSEMIDE 40 MG PO TABS
40.0000 mg | ORAL_TABLET | Freq: Every day | ORAL | Status: DC
  Administered 2019-10-24 – 2019-10-25 (×2): 40 mg via ORAL
  Filled 2019-10-24 (×2): qty 1

## 2019-10-24 MED ORDER — POTASSIUM CHLORIDE CRYS ER 20 MEQ PO TBCR
20.0000 meq | EXTENDED_RELEASE_TABLET | Freq: Every day | ORAL | Status: DC
  Administered 2019-10-24 – 2019-10-27 (×4): 20 meq via ORAL
  Filled 2019-10-24 (×4): qty 1

## 2019-10-24 NOTE — Progress Notes (Signed)
Patient ID: Cody Arellano, male   DOB: 11-Aug-1945, 74 y.o.   MRN: [id number]      Cardiology Rounding Note  PCP-Cardiologist: Mertie Moores, MD   Subjective:    Anterior STEMI at admission, LHC with occluded ostial LAD (in-stent), 90% ostial ramus, 60-70% proximal LCx, nondominant RCA.  PTCA to LAD and ramus to restore flow, then CABG with LIMA-LAD and SVG-ramus. Echo pre-op with EF 65-70%, normal RV function, bicuspid AoV with mild AS and mild AI.   IABP removed 5/17.  Post-op echo with EF 45-50%, septal hypokinesis.   Post op recovery c/b PAF. He is now in NSR with short intermittent episodes of A. fib with RVR on amiodarone gtt 30 mg/hr.    Yesterday diuresed with IV lasix.  He is slowly feeling better, denies shortness of breath.  CO-OX 62% on milrinone 0.125 mcg, milrinone was discontinued CO-OX today 53%.   Objective:    Weight Range: 83.4 kg Body mass index is 26.38 kg/m.   Vital Signs:   Temp:  [97.9 F (36.6 C)-98.7 F (37.1 C)] 98.5 F (36.9 C) (05/22 0827) Pulse Rate:  [67-110] 70 (05/22 0955) Resp:  [16-20] 16 (05/22 0827) BP: (112-118)/(67-82) 116/75 (05/22 0827) SpO2:  [94 %-100 %] 96 % (05/22 0827) Weight:  [83.4 kg] 83.4 kg (05/22 0700) Last BM Date: 10/22/19  Weight change: Filed Weights   10/22/19 0500 10/23/19 0656 10/24/19 0700  Weight: 85 kg 83.8 kg 83.4 kg    Intake/Output:   Intake/Output Summary (Last 24 hours) at 10/24/2019 1031 Last data filed at 10/24/2019 0957 Gross per 24 hour  Intake 605.35 ml  Output 1300 ml  Net -694.65 ml    Physical Exam   CVP ~ 7 General:  Appears weak.  No resp difficulty HEENT: normal Neck: supple. JVP 9-10 . Carotids 2+ bilat; no bruits. No lymphadenopathy or thryomegaly appreciated. Cor: PMI nondisplaced. Irregular rate & rhythm. No rubs, gallops or murmurs. Sternal incision  Lungs: clear on 2 liters  Abdomen: soft, nontender, nondistended. No hepatosplenomegaly. No bruits or masses. Good bowel  sounds. Extremities: no cyanosis, clubbing, rash, edema. RUE PICC Neuro: alert & orientedx3, cranial nerves grossly intact. moves all 4 extremities w/o difficulty. Affect pleasant  Telemetry   Sinus rhythm with ventricular rate in 70s, since yesterday 2 episodes of very quick atrial fibrillation with RVR spontaneously cardioverted back to sinus rhythm.  Labs    CBC Recent Labs    10/22/19 0500 10/23/19 0422  WBC 10.2 10.1  NEUTROABS 8.1* 7.6  HGB 8.7* 9.4*  HCT 27.0* 28.5*  MCV 100.4* 99.7  PLT 118* 0000000   Basic Metabolic Panel Recent Labs    10/23/19 0422 10/24/19 0400  NA 137 138  K 3.7 4.3  CL 102 99  CO2 28 29  GLUCOSE 104* 117*  BUN 25* 24*  CREATININE 1.14 1.08  CALCIUM 7.7* 7.9*  MG 2.3  --    Liver Function Tests No results for input(s): AST, ALT, ALKPHOS, BILITOT, PROT, ALBUMIN in the last 72 hours. No results for input(s): LIPASE, AMYLASE in the last 72 hours. Cardiac Enzymes No results for input(s): CKTOTAL, CKMB, CKMBINDEX, TROPONINI in the last 72 hours.  BNP: BNP (last 3 results) No results for input(s): BNP in the last 8760 hours.  ProBNP (last 3 results) No results for input(s): PROBNP in the last 8760 hours.   D-Dimer No results for input(s): DDIMER in the last 72 hours. Hemoglobin A1C No results for input(s): HGBA1C in the last 72  hours. Fasting Lipid Panel No results for input(s): CHOL, HDL, LDLCALC, TRIG, CHOLHDL, LDLDIRECT in the last 72 hours. Thyroid Function Tests No results for input(s): TSH, T4TOTAL, T3FREE, THYROIDAB in the last 72 hours.  Invalid input(s): FREET3  Imaging   No results found.  Medications:     Scheduled Medications: . aspirin EC  81 mg Oral Daily  . azaTHIOprine  50 mg Oral Daily  . bisacodyl  10 mg Oral Daily   Or  . bisacodyl  10 mg Rectal Daily  . Chlorhexidine Gluconate Cloth  6 each Topical Daily  . Chlorhexidine Gluconate Cloth  6 each Topical Daily  . digoxin  0.125 mg Oral Daily  .  docusate sodium  200 mg Oral Daily  . enoxaparin (LOVENOX) injection  30 mg Subcutaneous QHS  . furosemide  40 mg Oral Daily  . hydroxychloroquine  200 mg Oral QHS  . pantoprazole  40 mg Oral Daily  . potassium chloride  20 mEq Oral Daily  . rosuvastatin  40 mg Oral Daily  . sertraline  50 mg Oral QHS  . sodium chloride flush  10-40 mL Intracatheter Q12H  . sodium chloride flush  3 mL Intravenous Q12H  . tamsulosin  0.4 mg Oral QHS   Infusions: . sodium chloride    . amiodarone 30 mg/hr (10/24/19 0954)    PRN Medications: sodium chloride, metoprolol tartrate, ondansetron (ZOFRAN) IV, oxyCODONE, promethazine, sodium chloride flush, sodium chloride flush, traMADol   Assessment/Plan   1. CAD: Prior history of MI with LAD and ramus PCI.  Admitted with anterior STEMI. LHC with occluded ostial LAD (in-stent), 90% ostial ramus, 60-70% proximal LCx, nondominant RCA.  PTCA to LAD and ramus to restore flow, then CABG with LIMA-LAD and SVG-ramus.  - Continue ASA, statin.  2. Acute systolic CHF: Post-op echo with EF 45-50%, septal hypokinesis, bicuspid AoV with mild AS and mild AI.  IABP out, milrinone was discontinued yesterday with CO-OX 62%, today decreased to 53%, however continues to diurese with negative fluid balance and CVP today 7, will monitor CVP and CO-OX, patient continues to improve symptomatically.  Creatinine is stable.  Potassium 3.7. - Hold metoprolol with presyncopal episode, will restart tomorrow if he is stable 3. Atrial fibrillation: Paroxysmal post-op.  He is back in sinus rhythm with 2 very short episodes of A. fib within the last 24 hours -I will continue amiodarone drip till tomorrow and if he remains in sinus rhythm switch to p.o. -Start Eliquis when going home.  4. SLE: History of SLE pericarditis.  He is back on home Plaquenil and Imuran.  5. Bicuspid aortic valve: Mild AS and mild AI.  6. AKI: Creatinine down to 1.14  - continue to monitor w/ diuresis   Length of  Stay: 7  Ena Dawley, MD  10/24/2019, 10:31 AM 10:31 AM

## 2019-10-24 NOTE — Progress Notes (Addendum)
7 Days Post-Op Procedure(s) (LRB): CORONARY ARTERY BYPASS GRAFTING (CABG) using LIMA to LAD; Endoscopic harvest right greater saphenous vein: SVG to RAMUS. (N/A) Transesophageal Echocardiogram (Tee) Clipping Of Atrial Appendage using AtriCure PRO245 45 MM AtriClip. (N/A) Endovein Harvest Of Greater Saphenous Vein (Right) Subjective: Resting in bed, says he feels OK. Walked in the hall yesterday but has not yet today.  O2 at 2L/[place].  Milrinone off Amiodarone infusion at 1mg /min  Objective: Vital signs in last 24 hours: Temp:  [97.9 F (36.6 C)-98.7 F (37.1 C)] 98.5 F (36.9 C) (05/22 0827) Pulse Rate:  [67-110] 70 (05/22 0955) Cardiac Rhythm: Normal sinus rhythm;Bundle branch block (05/22 0808) Resp:  [16-20] 16 (05/22 0827) BP: (112-118)/(67-82) 116/75 (05/22 0827) SpO2:  [94 %-100 %] 96 % (05/22 0827) Weight:  [83.4 kg] 83.4 kg (05/22 0700)  Hemodynamic parameters for last 24 hours: CVP:  [8 mmHg-14 mmHg] 14 mmHg  Intake/Output from previous day: 05/21 0701 - 05/22 0700 In: 842.4 [P.O.:480; I.V.:362.4] Out: 1300 [Urine:1300] Intake/Output this shift: Total I/O In: 3 [I.V.:3] Out: -   Physical Exam: General appearance:alert, cooperative andnodistress Neurologic:intact Heart:SR now but has had brief episodes of PAF this morning.  Lungs:Breath sounds are clear anterior, O2 sats acceptable on 2Lnc Abdomen:soft, mild distension, non-tender Extremities:all warm and well perfused Wound:Thesternalincisionand bilateral EVH incisions arewell approximated and dry. PICC RUE, site OK.  Lab Results: Recent Labs    10/22/19 0500 10/23/19 0422  WBC 10.2 10.1  HGB 8.7* 9.4*  HCT 27.0* 28.5*  PLT 118* 178   BMET:  Recent Labs    10/23/19 0422 10/24/19 0400  NA 137 138  K 3.7 4.3  CL 102 99  CO2 28 29  GLUCOSE 104* 117*  BUN 25* 24*  CREATININE 1.14 1.08  CALCIUM 7.7* 7.9*    PT/INR: No results for input(s): LABPROT, INR in the last 72 hours. ABG    Component Value Date/Time   PHART 7.371 10/18/2019 0224   HCO3 20.5 10/18/2019 0224   TCO2 22 10/18/2019 0224   ACIDBASEDEF 4.0 (H) 10/18/2019 0224   O2SAT 53.3 10/24/2019 0400   CBG (last 3)  Recent Labs    10/23/19 1131 10/23/19 2200 10/24/19 0637  GLUCAP 102* 104* 92    Assessment/Plan: S/P Procedure(s) (LRB): CORONARY ARTERY BYPASS GRAFTING (CABG) using LIMA to LAD; Endoscopic harvest right greater saphenous vein: SVG to RAMUS. (N/A) Transesophageal Echocardiogram (Tee) Clipping Of Atrial Appendage using AtriCure [id number] 45 MM AtriClip. (N/A) Endovein Harvest Of Greater Saphenous Vein (Right)  -POD-6emergency CABG x 2 after presenting with acute STEMI. IABP removed POD-2.Progressing with ambulation.  Stable off milrinone.  CoOx53 today. Metoprolol held for near syncopal episode yesterday.   -Post-op afib- initially converted back to SR on IVamiodarone and was converted to PO amio. But Afib recurred so he was re-bolused and placed back on the infusion on 5/20. [name] Now in SR with brief episodes of PAF. K+ 4.3 today and  Planning eventual discharge on Eliquis and ASA 81mg .  -Mild respiratory insufficiency- CXR 5/21 shows small left pleural effusion with ATX. Encouraging pulmonary hygiene and ambulation. Wt still several Kg above pre-op. Will give daily lasix.  Wean O2 per protocol.  --Nausea- resolved  -Expected ABL anemia- Hctacceptable, monitor.  -Volume excess-resolved.  -DVT PPX-On daily enoxaparin.   -History of lupus- Imuran, Plaquenilresumed.    LOS: 7 days    Antony Odea, [place] [phone] 10/24/2019

## 2019-10-25 ENCOUNTER — Inpatient Hospital Stay: Payer: Self-pay

## 2019-10-25 LAB — BASIC METABOLIC PANEL
Anion gap: 7 (ref 5–15)
BUN: 20 mg/dL (ref 8–23)
CO2: 30 mmol/L (ref 22–32)
Calcium: 7.9 mg/dL — ABNORMAL LOW (ref 8.9–10.3)
Chloride: 102 mmol/L (ref 98–111)
Creatinine, Ser: 1.06 mg/dL (ref 0.61–1.24)
GFR calc Af Amer: >60 mL/min (ref >60)
GFR calc non Af Amer: >60 mL/min (ref >60)
Glucose, Bld: 98 mg/dL (ref 70–99)
Potassium: 4.8 mmol/L (ref 3.5–5.1)
Sodium: 139 mmol/L (ref 135–145)

## 2019-10-25 LAB — COOXEMETRY PANEL
Carboxyhemoglobin: 1.5 % (ref 0.5–1.5)
Carboxyhemoglobin: 1.6 % — ABNORMAL HIGH (ref 0.5–1.5)
Methemoglobin: 1.1 % (ref 0.0–1.5)
Methemoglobin: 1.1 % (ref 0.0–1.5)
O2 Saturation: 46.8 %
O2 Saturation: 55.3 %
Total hemoglobin: 9.8 g/dL — ABNORMAL LOW (ref 12.0–16.0)
Total hemoglobin: 9.6 g/dL — ABNORMAL LOW (ref 12.0–16.0)

## 2019-10-25 LAB — GLUCOSE, CAPILLARY
Glucose-Capillary: 91 mg/dL (ref 70–99)
Glucose-Capillary: 89 mg/dL (ref 70–99)
Glucose-Capillary: 79 mg/dL (ref 70–99)

## 2019-10-25 MED ORDER — ALTEPLASE 2 MG IJ SOLR
2.0000 mg | Freq: Once | INTRAMUSCULAR | Status: AC
  Administered 2019-10-25: 2 mg
  Filled 2019-10-25: qty 2

## 2019-10-25 MED ORDER — LOSARTAN POTASSIUM 25 MG PO TABS
12.5000 mg | ORAL_TABLET | Freq: Every day | ORAL | Status: DC
  Administered 2019-10-25: 12.5 mg via ORAL
  Filled 2019-10-25: qty 1

## 2019-10-25 MED ORDER — ALTEPLASE 2 MG IJ SOLR
2.0000 mg | Freq: Once | INTRAMUSCULAR | Status: AC
  Administered 2019-10-25: 2 mg

## 2019-10-25 MED ORDER — SPIRONOLACTONE 12.5 MG HALF TABLET
12.5000 mg | ORAL_TABLET | Freq: Every day | ORAL | Status: DC
  Administered 2019-10-25 – 2019-10-27 (×3): 12.5 mg via ORAL
  Filled 2019-10-25 (×3): qty 1

## 2019-10-25 NOTE — Progress Notes (Addendum)
Patient ID: Cody Arellano, male   DOB: [birth year], 74 y.o.   MRN: [id number]      Cardiology Rounding Note  PCP-Cardiologist: Mertie Moores, MD   Subjective:    Anterior STEMI at admission, LHC with occluded ostial LAD (in-stent), 90% ostial ramus, 60-70% proximal LCx, nondominant RCA.  PTCA to LAD and ramus to restore flow, then CABG with LIMA-LAD and SVG-ramus. Echo pre-op with EF 65-70%, normal RV function, bicuspid AoV with mild AS and mild AI.   IABP removed 5/17.  Post-op echo with EF 45-50%, septal hypokinesis.   Post op recovery c/b PAF. He is now in NSR with short intermittent episodes of A. fib with RVR on amiodarone gtt 30 mg/hr.    He is slowly feeling better, denies shortness of breath.  He has been walking in the hallway with assistance.  Denies any worsening symptoms.  CO-OX 62% on milrinone 0.125 mcg, milrinone was discontinued CO-OX yesterday 53%, today 46.8 %.  Objective:    Weight Range: 81.6 kg Body mass index is 25.81 kg/m.   Vital Signs:   Temp:  [97.6 F (36.4 C)-98.4 F (36.9 C)] 98.2 F (36.8 C) (05/23 1148) Pulse Rate:  [60-79] 60 (05/23 1148) Resp:  [15-20] 19 (05/23 1148) BP: (98-110)/(49-68) 98/65 (05/23 1148) SpO2:  [91 %-100 %] 91 % (05/23 1148) Weight:  [81.6 kg] 81.6 kg (05/23 0450) Last BM Date: 10/22/19  Weight change: Filed Weights   10/23/19 0656 10/24/19 0700 10/25/19 0450  Weight: 83.8 kg 83.4 kg 81.6 kg    Intake/Output:   Intake/Output Summary (Last 24 hours) at 10/25/2019 1213 Last data filed at 10/25/2019 0900 Gross per 24 hour  Intake 683.51 ml  Output 400 ml  Net 283.51 ml    Physical Exam    CVP ~ 11 General: Patient sitting in a chair looking comfortable HEENT: normal Neck: supple. JVP 9-10 . Carotids 2+ bilat; no bruits. No lymphadenopathy or thryomegaly appreciated. Cor: PMI nondisplaced. Irregular rate & rhythm. No rubs, gallops or murmurs. Sternal incision  Lungs: clear on 2 liters  Abdomen: soft, nontender,  nondistended. No hepatosplenomegaly. No bruits or masses. Good bowel sounds. Extremities: no cyanosis, clubbing, rash, edema. RUE PICC Neuro: alert & orientedx3, cranial nerves grossly intact. moves all 4 extremities w/o difficulty. Affect pleasant  Telemetry   The patient is currently in sinus rhythm but in and out of short episodes of A. fib since yesterday.    Labs    CBC Recent Labs    10/23/19 0422  WBC 10.1  NEUTROABS 7.6  HGB 9.4*  HCT 28.5*  MCV 99.7  PLT 0000000   Basic Metabolic Panel Recent Labs    10/23/19 0422 10/23/19 0422 10/24/19 0400 10/25/19 0659  NA 137   < > 138 139  K 3.7   < > 4.3 4.8  CL 102   < > 99 102  CO2 28   < > 29 30  GLUCOSE 104*   < > 117* 98  BUN 25*   < > 24* 20  CREATININE 1.14   < > 1.08 1.06  CALCIUM 7.7*   < > 7.9* 7.9*  MG 2.3  --   --   --    < > = values in this interval not displayed.   Liver Function Tests No results for input(s): AST, ALT, ALKPHOS, BILITOT, PROT, ALBUMIN in the last 72 hours. No results for input(s): LIPASE, AMYLASE in the last 72 hours. Cardiac Enzymes No results for input(s): CKTOTAL, CKMB,  CKMBINDEX, TROPONINI in the last 72 hours.  BNP: BNP (last 3 results) No results for input(s): BNP in the last 8760 hours.  ProBNP (last 3 results) No results for input(s): PROBNP in the last 8760 hours.   D-Dimer No results for input(s): DDIMER in the last 72 hours. Hemoglobin A1C No results for input(s): HGBA1C in the last 72 hours. Fasting Lipid Panel No results for input(s): CHOL, HDL, LDLCALC, TRIG, CHOLHDL, LDLDIRECT in the last 72 hours. Thyroid Function Tests No results for input(s): TSH, T4TOTAL, T3FREE, THYROIDAB in the last 72 hours.  Invalid input(s): FREET3  Imaging   No results found.  Medications:     Scheduled Medications: . aspirin EC  81 mg Oral Daily  . azaTHIOprine  50 mg Oral Daily  . bisacodyl  10 mg Oral Daily   Or  . bisacodyl  10 mg Rectal Daily  . Chlorhexidine Gluconate  Cloth  6 each Topical Daily  . Chlorhexidine Gluconate Cloth  6 each Topical Daily  . digoxin  0.125 mg Oral Daily  . docusate sodium  200 mg Oral Daily  . enoxaparin (LOVENOX) injection  30 mg Subcutaneous QHS  . furosemide  40 mg Oral Daily  . hydroxychloroquine  200 mg Oral QHS  . losartan  12.5 mg Oral Daily  . pantoprazole  40 mg Oral Daily  . potassium chloride  20 mEq Oral Daily  . rosuvastatin  40 mg Oral Daily  . sertraline  50 mg Oral QHS  . sodium chloride flush  10-40 mL Intracatheter Q12H  . sodium chloride flush  3 mL Intravenous Q12H  . spironolactone  12.5 mg Oral Daily  . tamsulosin  0.4 mg Oral QHS   Infusions: . sodium chloride    . amiodarone 30 mg/hr (10/25/19 0917)    PRN Medications: sodium chloride, metoprolol tartrate, ondansetron (ZOFRAN) IV, oxyCODONE, promethazine, sodium chloride flush, sodium chloride flush, traMADol   Assessment/Plan   1. CAD: Prior history of MI with LAD and ramus PCI.  Admitted with anterior STEMI. LHC with occluded ostial LAD (in-stent), 90% ostial ramus, 60-70% proximal LCx, nondominant RCA.  PTCA to LAD and ramus to restore flow, then CABG with LIMA-LAD and SVG-ramus.  - Continue ASA, statin.   2. Acute systolic CHF: Post-op echo with EF 45-50%, septal hypokinesis, bicuspid AoV with mild AS and mild AI.  IABP out, milrinone was discontinued yesterday with CO-OX 62%, yesterday decreased to 53%, today 46.8%.  We will repeat  -The patient states that he feels well and denies any worsening of shortness of breath.  He was able to walk in the hallway.   -I's and O's not accurate in last 24 hours however weight down from 83.4 -> 81.6 kg. -CVP today 11 - Creatinine is stable 1.06.  Potassium 3.7. - Hold metoprolol with presyncopal episode -We will start low-dose losartan 12.5 mg daily and spironolactone 12.5 mg daily.  3. Atrial fibrillation: Paroxysmal post-op.  Patient in and out of A. fib in the last 24 hours we will continue IV  amiodarone drip. -Start Eliquis when going home.   4. SLE: History of SLE pericarditis.  He is back on home Plaquenil and Imuran.  5. Bicuspid aortic valve: Mild AS and mild AI.  6. AKI: Creatinine down to 1.14  - continue to monitor w/ diuresis   Length of Stay: 8  Ena Dawley, MD  10/25/2019, 12:13 PM 12:13 PM

## 2019-10-25 NOTE — Progress Notes (Signed)
Peripherally Inserted Central Catheter Placement  The IV Nurse has discussed with the patient and/or persons authorized to consent for the patient, the purpose of this procedure and the potential benefits and risks involved with this procedure.  The benefits include less needle sticks, lab draws from the catheter, and the patient may be discharged home with the catheter. Risks include, but not limited to, infection, bleeding, blood clot (thrombus formation), and puncture of an artery; nerve damage and irregular heartbeat and possibility to perform a PICC exchange if needed/ordered by physician.  Alternatives to this procedure were also discussed.  Bard Power PICC patient education guide, fact sheet on infection prevention and patient information card has been provided to patient /or left at bedside.  PICC exchanged due to malposition.  Original consent obtained 10/19/19 0848  PICC Placement Documentation  PICC Double Lumen 10/25/19 PICC Right  50 cm 1 cm (Active)  Indication for Insertion or Continuance of Line Vasoactive infusions 10/25/19 1701  Exposed Catheter (cm) 1 cm 10/25/19 1701  Site Assessment Clean;Dry;Intact 10/25/19 1701  Lumen #1 Status Flushed;Saline locked;Blood return noted 10/25/19 1701  Lumen #2 Status Flushed;Saline locked;Blood return noted 10/25/19 1701  Dressing Type Transparent 10/25/19 1701  Dressing Status Clean;Dry;Intact 10/25/19 1701  Dressing Intervention New dressing 10/25/19 1701  Dressing Change Due 11/01/19 10/25/19 1701       Willey Due, Nicolette Bang 10/25/2019, 5:03 PM

## 2019-10-25 NOTE — Progress Notes (Signed)
[address]       [place] [address]             [phone]    8 Days Post-Op Procedure(s) (LRB): CORONARY ARTERY BYPASS GRAFTING (CABG) using LIMA to LAD; Endoscopic harvest right greater saphenous vein: SVG to RAMUS. (N/A) Transesophageal Echocardiogram (Tee) Clipping Of Atrial Appendage using AtriCure [id number] 45 MM AtriClip. (N/A) Endovein Harvest Of Greater Saphenous Vein (Right) Subjective: Up in the bedside chair, RN working on declotting one of the ports of the PICC. Says he feels good and is eager to return home.   Objective: Vital signs in last 24 hours: Temp:  [97.6 F (36.4 C)-98.4 F (36.9 C)] 98.2 F (36.8 C) (05/23 1148) Pulse Rate:  [60-79] 60 (05/23 1148) Cardiac Rhythm: Atrial fibrillation (05/23 0747) Resp:  [15-20] 19 (05/23 1148) BP: (98-110)/(49-68) 98/65 (05/23 1148) SpO2:  [91 %-100 %] 91 % (05/23 1148) Weight:  [81.6 kg] 81.6 kg (05/23 0450)  Hemodynamic parameters for last 24 hours: CVP:  [11 mmHg-12 mmHg] 11 mmHg  Intake/Output from previous day: 05/22 0701 - 05/23 0700 In: 443.5 [I.V.:443.5] Out: 400 [Urine:400] Intake/Output this shift: Total I/O In: 243 [P.O.:240; I.V.:3] Out: -   Physical Exam: General appearance:alert, cooperative andnodistress Neurologic:intact Heart:SR during this visit but has been in and out of a-fib several times since early morning.  Lungs:Breath sounds are clear anterior, O2 sats acceptable on 2Lnc Abdomen:soft, mild distension, non-tender Extremities:all warm and well perfused Wound:Thesternalincisionand bilateral EVH incisions arewell approximated and dry. PICC RUE, site OK, one of the ports is obstructed.   Lab Results: Recent Labs    10/23/19 0422  WBC 10.1  HGB 9.4*  HCT 28.5*  PLT 178   BMET:  Recent Labs    10/24/19 0400 10/25/19 0659  NA 138 139  K 4.3 4.8  CL 99 102  CO2 29 30  GLUCOSE 117* 98  BUN 24* 20  CREATININE 1.08 1.06  CALCIUM 7.9* 7.9*     PT/INR: No results for input(s): LABPROT, INR in the last 72 hours. ABG    Component Value Date/Time   PHART 7.371 10/18/2019 0224   HCO3 20.5 10/18/2019 0224   TCO2 22 10/18/2019 0224   ACIDBASEDEF 4.0 (H) 10/18/2019 0224   O2SAT 46.8 10/25/2019 0659   CBG (last 3)  Recent Labs    10/24/19 2129 10/25/19 0627 10/25/19 1143  GLUCAP 105* 91 89    Assessment/Plan: S/P Procedure(s) (LRB): CORONARY ARTERY BYPASS GRAFTING (CABG) using LIMA to LAD; Endoscopic harvest right greater saphenous vein: SVG to RAMUS. (N/A) Transesophageal Echocardiogram (Tee) Clipping Of Atrial Appendage using AtriCure [id number] 45 MM AtriClip. (N/A) Endovein Harvest Of Greater Saphenous Vein (Right)  -POD-8emergency CABG x 2 after presenting with acute STEMI. IABP removed POD-1.Progressing with ambulation. Stable off milrinone. CoOx pending.  Metoprolol held for near syncopal episode 5/21.  -Post-op afib- initially converted back to SR on IVamiodaroneand wasconverted to PO amio.  Afib recurred so he was re-bolused and placed back on the infusion on 5/20. Marland KitchenContinues to have PAF.K+4.8 today and  Planning eventual discharge on Eliquis and ASA 81mg .Cardiology managing the amiodarone dosing.   -Mild respiratory insufficiency- CXR 5/21 shows small left pleural effusion with ATX. Encouraging pulmonary hygiene and ambulation. Wt still several Kg above pre-op. Will continue daily lasix.  Wean O2 per protocol. Re-check the CXR in AM.   --Nausea-resolved  -Expected ABL anemia- Hctacceptable, monitor.  -DVT PPX-On daily enoxaparin.   -History of lupus-  Imuran, Plaquenilresumed.    LOS: 8 days    Antony Odea , [place] [phone] 10/25/2019

## 2019-10-26 ENCOUNTER — Inpatient Hospital Stay (HOSPITAL_COMMUNITY): Payer: Medicare Other

## 2019-10-26 HISTORY — PX: IR THORACENTESIS ASP PLEURAL SPACE W/IMG GUIDE: IMG5380

## 2019-10-26 LAB — BASIC METABOLIC PANEL
Anion gap: 16 — ABNORMAL HIGH (ref 5–15)
BUN: 18 mg/dL (ref 8–23)
CO2: 28 mmol/L (ref 22–32)
Calcium: 7.9 mg/dL — ABNORMAL LOW (ref 8.9–10.3)
Chloride: 95 mmol/L — ABNORMAL LOW (ref 98–111)
Creatinine, Ser: 1.11 mg/dL (ref 0.61–1.24)
GFR calc Af Amer: >60 mL/min (ref >60)
GFR calc non Af Amer: >60 mL/min (ref >60)
Glucose, Bld: 124 mg/dL — ABNORMAL HIGH (ref 70–99)
Potassium: 4.3 mmol/L (ref 3.5–5.1)
Sodium: 139 mmol/L (ref 135–145)

## 2019-10-26 LAB — COOXEMETRY PANEL
Carboxyhemoglobin: 1.3 % (ref 0.5–1.5)
Methemoglobin: 0.7 % (ref 0.0–1.5)
O2 Saturation: 59.4 %
Total hemoglobin: 8.4 g/dL — ABNORMAL LOW (ref 12.0–16.0)

## 2019-10-26 LAB — CBC
HCT: 27.4 % — ABNORMAL LOW (ref 39.0–52.0)
Hemoglobin: 8.6 g/dL — ABNORMAL LOW (ref 13.0–17.0)
MCH: 32.1 pg (ref 26.0–34.0)
MCHC: 31.4 g/dL (ref 30.0–36.0)
MCV: 102.2 fL — ABNORMAL HIGH (ref 80.0–100.0)
Platelets: 278 10*3/uL (ref 150–400)
RBC: 2.68 MIL/uL — ABNORMAL LOW (ref 4.22–5.81)
RDW: 13.5 % (ref 11.5–15.5)
WBC: 8.1 10*3/uL (ref 4.0–10.5)
nRBC: 0.5 % — ABNORMAL HIGH (ref 0.0–0.2)

## 2019-10-26 LAB — GLUCOSE, CAPILLARY: Glucose-Capillary: 91 mg/dL (ref 70–99)

## 2019-10-26 MED ORDER — FUROSEMIDE 10 MG/ML IJ SOLN
40.0000 mg | Freq: Once | INTRAMUSCULAR | Status: AC
  Administered 2019-10-26: 40 mg via INTRAVENOUS
  Filled 2019-10-26: qty 4

## 2019-10-26 MED ORDER — FUROSEMIDE 40 MG PO TABS: 40.0000 mg | ORAL_TABLET | Freq: Every day | ORAL | Status: DC

## 2019-10-26 MED ORDER — AMIODARONE HCL 200 MG PO TABS
200.0000 mg | ORAL_TABLET | Freq: Two times a day (BID) | ORAL | Status: DC
  Administered 2019-10-26 – 2019-10-27 (×3): 200 mg via ORAL
  Filled 2019-10-26 (×3): qty 1

## 2019-10-26 MED ORDER — FUROSEMIDE 40 MG PO TABS
40.0000 mg | ORAL_TABLET | Freq: Two times a day (BID) | ORAL | Status: DC
  Administered 2019-10-27: 40 mg via ORAL
  Filled 2019-10-26: qty 2

## 2019-10-26 MED ORDER — LIDOCAINE HCL 1 % IJ SOLN
INTRAMUSCULAR | Status: DC | PRN
  Administered 2019-10-26: 10 mL

## 2019-10-26 MED ORDER — LIDOCAINE HCL 1 % IJ SOLN
INTRAMUSCULAR | Status: AC
  Filled 2019-10-26: qty 20

## 2019-10-26 MED ORDER — LOSARTAN POTASSIUM 25 MG PO TABS
25.0000 mg | ORAL_TABLET | Freq: Every day | ORAL | Status: DC
  Administered 2019-10-26 – 2019-10-27 (×2): 25 mg via ORAL
  Filled 2019-10-26 (×2): qty 1

## 2019-10-26 MED ORDER — AMIODARONE HCL 200 MG PO TABS: 200.0000 mg | ORAL_TABLET | Freq: Two times a day (BID) | ORAL | Status: DC

## 2019-10-26 NOTE — Progress Notes (Signed)
CARDIAC REHAB PHASE I   PRE:  Rate/Rhythm: 80 afib/NSR    BP: sitting 103/62    SaO2: 97 1 1/2L, 92 RA  MODE:  Ambulation: 340 ft   POST:  Rate/Rhythm: 77 afib/NSR    BP: sitting 101/57     SaO2: 92 2L,  97 RA  Pt eager to d/c O2 due to sore nares and slight bleeding. Walked with RW, RA, assist x1 with gait belt 240 ft with SaO2 maintaining 90-92 RA. Down to 87 RA at 240 ft therefore reapplied 2L for rest of walk. Pt fairly steady, feet outside of RW on turn. C/o slight dizziness today with low BP. D/c'd O2 after walk as at rest pt 97 RA. However transport came for thoracentesis therefore reapplied. Encouraged IS and more ambulation. [id number]  [place], ACSM 10/26/2019 10:06 AM

## 2019-10-26 NOTE — Procedures (Signed)
Ultrasound-guided therapeutic left sided  thoracentesis performed yielding 1 liters of serosangoinous colored fluid. No immediate complications. Follow-up chest x-ray pending. EBL is < 2 ml.

## 2019-10-26 NOTE — Discharge Summary (Signed)
Physician Discharge Summary  Patient ID: Cody Arellano MRN: [id number] DOB/AGE: 1945-08-05 74 y.o.  Admit date: 10/17/2019 Discharge date: 10/27/2019  Admission Diagnoses: Coronary artery disease History of remote PTCI Acute ST elevation myocardial infarction History of atrial fibrillation Interstitial lung disease Dyslipidemia History of benign prostatic hyperplasia with obstruction    Discharge Diagnoses:   Coronary artery disease Acute ST elevation myocardial infarction Interstitial lung disease Dyslipidemia History of benign prostatic hyperplasia with obstruction Paroxysmal atrial fibrillation / flutter   Discharged Condition: good  History of present illness:  CARDIOTHORACIC SURGERY CONSULTATION REPORT  PCP is Venia Carbon, MD Referring Provider is Nelva Bush, MD Primary Cardiologist is Mertie Moores, MD  Reason for consultation:  Acute ST-segment Elevation Myocardial Infarction  HPI:  Patient is a 74 year old African-American male with history of coronary artery disease, hypertension, hyperlipidemia, paroxysmal atrial fibrillation and lupus who is referred for emergent management of acute ST segment elevation myocardial infarction.  Patient's cardiac history dates back to 2008 when he presented with an acute anterior wall myocardial infarction.  He underwent PCI and stenting x2 and reportedly had follow-up PCI in 2009.  At the time he lived in [place] and details are not currently available.  More recently the patient has been followed by Dr. 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  He has been maintaining sinus rhythm ever since and is not on long-term anticoagulation.  Patient states that he was in his usual state of health until today when he developed sudden onset of chest pain associated with shortness of breath after just having finished mowing his yard.  Chest  pain became progressively severe over 15 minutes, ultimately prompting the patient and his family to summon EMS.  EKG revealed anterolateral ST segment elevation and the patient was brought directly to the Cath Lab following arrival in the emergency department.  Diagnostic cardiac catheterization performed by Dr. Saunders Revel revealed acute 100% occlusion of the proximal left anterior descending coronary artery at site of previous stent.  There was 90% proximal stenosis of a large ramus intermediate branch.  Intra-aortic balloon pump was placed and primary balloon angioplasty was performed restoring TIMI-3 flow in the left anterior descending coronary artery.  Attempts to proceed with definitive PCI and stenting of the left anterior descending coronary artery were aborted due to extremely unfavorable anatomy.  Right heart catheterization was performed and emergency cardiothoracic surgical consultation was requested.  Patient is married and lives locally in [place] with his wife.  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  He has remained reasonably active physically although he does not exercise on a regular basis.  He denies any recent history of exertional chest pain prior to the development of chest pain earlier today.  He describes stable symptoms of mild exertional shortness of breath.  He denies any history of resting shortness of breath, PND, orthopnea, or lower extremity edema.  He has not had recent fevers or chills.  He has been vaccinated for COVID-19.   Hospital Course:   Mr. Cody Arellano was taken to the operating room in stable condition.  He underwent CABG x 2 utilizing LIMA to LAD, and SVG to Ramus Intermediate.  He underwent clipping of Left atrial appendage with a 45 mm Atricure Proclip 2, lysis of adhesions, and endoscopic harvest of greater saphenous vein from his right thigh.  He tolerated  the procedure without difficulty and was taken to  the SICU in stable condition.  The patient was weaned and extubated the evening of surgery.  He was weaned off Levophed, Neo-synephrine, and Milrinone drips as hemodynamics allowed.  His intra aortic balloon pump was weaned to 1:3 on POD #1.  This was e removed on POD #2.  He was on an Amiodarone drip for Atrial Fibrillation prophylaxis and initially converted to sinus rhythm but later had recurrence of paroxysmal atrial fibrillation/flutter.  This continued despite rebolusing his amiodarone.  His ventricular rate remained well controlled.  This was transitioned to an oral regimen prior to discharge.  His chest tubes and arterial lines were removed without difficulty.  He was restarted on Plaquenil and Imuran for his SLE. He was transferred to progressive care.  He was followed closely by the heart failure team.  He was diuresed as necessary.  He continued to require supplemental oxygen several days following surgery.  His chest x-ray was repeated and demonstrated a significant left pleural effusion.  Thoracentesis was carried out on 10/26/2019 yielding 1 L of thin serous fluid.  His respiratory status improved.  His oxygen was weaned and is now off.  He regained independence with his mobility and was having appropriate bowel bladder function at time of discharge.    Consults: cardiology (advanced heart failure)  Significant Diagnostic Studies:   CORONARY BALLOON ANGIOPLASTY   10/17/19 IABP Insertion  RIGHT/LEFT HEART CATH AND CORONARY ANGIOGRAPHY   Conclusions:  1.Severe multivessel coronary artery involving left-dominant system, with 100% thrombotic occlusion of the ostial/proximal LAD, involving previously stented segment, 90% ostial/proximal ramus intermedius in-stent restenosis, and 50% ostial/proximal LCx disease.  2.Upper normal to mildly elevated left and right heart filling pressures.  3.Mildly reduced Fick cardiac output/index.  4.Severely reduced left ventricular systolic function with  LVEF 25-30% and mid/apical anterior and inferior as well as apical hypokinesis/akinesis.  5.Successful IVUS-guided PTCA to ostial/proximal LAD with reduction in stenosis from 100% ->50% and restoration of TIMI-3 flow.  6.Successful placement of 50 mL intraaortic balloon pump via the right common femoral artery.   Recommendations:  1.Given difficult anatomy of the LMCA trifurcation, including protrusion of stent struts from the ramus into distal LMCA preventing intervention on this vessel and potential for acute closure of the the ramus and/or dominant LCx with stenting of the ostial LAD, decision was made to proceed with emergent CABG following discussion with cardiac surgery, shock/advanced HF, and interventional cardiology teams.  2.Discontinue cangrelor on transport to OR.  3.Dual antiplatelet therapy with aspirin and clopidogrel for at least 12 months when felt to be appropriate by cardiac surgery.  4.Aggressive secondary prevention.  5.Optimize evidence-based heart failure therapy post-CABG.  Nelva Bush, MD [facility]  Recommendations  Antiplatelet/Anticoag Recommend uninterrupted dual antiplatelet therapy with Aspirin 23m daily and Clopidogrel 7104mdaily for a minimum of 12 months (ACS-Class I recommendation). P2Y12 inhibitor deferred pending emergent CABG.   Procedural Details  Technical Details Indication: [age].o. year-old man with history of CAD s/p remote PCI's to the LAD and ramus, PAF, SLE, and hyperlipidemia, presenting with acute onset of chest pain while mowing his law and anterolateral ST elevation consistent with STEMI.  GFR: >60 ml/min  Procedure: The risks, benefits, complications, treatment options, and expected outcomes were discussed with the patient. The patient provided emergent verbal consent. The right wrist was assessed with a modified Allens test which was normal. The right wrist was prepped and draped in a sterile fashion. 1% lidocaine was used  for local anesthesia. Using the modified Seldinger access technique, a 20F slender Glidesheath was placed in the right radial artery. 3 mg Verapamil was given through the sheath. Heparin 8,000 units were administered.  Selective coronary angiography was performed using 20F EBU3.5 and 12F JR4 catheters to engage the left and right coronary arteries, respectively. Left heart catheterization was performed using a 12F JR4 catheter following PTCA of the LAD. Left ventriculogram was performed with a hand injection of contrast.  PTCA of LAD, RHC, and IABP placement: Heparin was used for anticoagulation.  The patient received cangrelor bolus and infusion.  Via the 20F EBU3.5 guide catheter, a Runthrough wire was advanced into the distal LAD and the ostial/proximal LAD dilated with a Sapphire 2.5 x 12 mm balloon, which reestablished flow.  Intravascular ultrasound was then performed with an OptiCross catheter, demonstrating thrombus and ISR involving the proxima and mid portions of the stent, as well as severe heterogenous plaque and thrombus proximal to the stent extending to the ostium of the LAD.  Stent struts protruding from the ramus intermedius into the distal LMCA were also noted.  The stent was further angioplastied with a Sapphire 3.0 x 15 mm balloon at 16 atm.  At this point, progressive hypotension was noted concerning for cardiogenic shock, prompting initiation of norepinephrine 5 mcg/kg/min.  Shock/advanced heart failure team was consulted and decision made to perform RHC to determine need for hemodynamic support.  The right groin was previously prepped and draped.  1% lidocaine was used for local anesthesia.  Ultrasound was used to evaluate the right common femoral vein. It was patent.  A micropuncture needle was used to access the right common femoral vein under ultrasound guidance.  This was upsized to 2F using modified Seldinger technique.  Right heart catheterization was performed by advancing a 2F  balloon-tipped catheter through the right heart chambers into the pulmonary capillary wedge position. Pressure measurements and oxygen saturations were obtained.  Thermodilution measurements were also performed.  Decision was made to place IABP for further support.  1% lidocaine was used for local anesthesia.  Ultrasound was used to evaluate the right common femoral artery. It was patent.  A micropuncture needle was used to access the right common femoral under ultrasound guidance.  Using modified Seldinger technique, this was exchanged for a 12F sheath.  Sheath angiogram demonstrated appropriate sheath placement.  The 12F sheath was upsized to 2F and a Sensa Plus 50 mL intraaortic balloon pump inserted into the descending aorta.  Attention was turned back to the coronary arteries.  A Cougar XT wire was advanced into the ramus intermedius.  I attempted to pass a Sapphire 2.0 x 12 mm balloon into the ramus but could not cross the proximal edge of the stent despite rewiring the vessel several times.  It was suspected that the wire tracked under a stent strut that protrudes into the distal LMCA.  Out of concern for acute closure of the ramus branch with stenting of the ostial LAD and inability to rescue the ramus intermedius, the decision was made to repeat angioplasty of the ostial/proximal LAD using the [place] Sapphire 3.0 x 15 mm balloon at 12-16 atm.  Final angiogram shows non-occlusive thrombus in the ostial LAD (<50%) with TIMI-3 flow throughout the left coronary artery.  Cardiac surgery consultation was obtained and decision made to proceed with emergent CABG to LAD and ramus.  The right radial artery, right femoral artery, and right femoral vein sheaths were secured in place.. There were no immediate  complications. The patient was taken directly to the OR in critical but stable condition. Estimated blood loss <50 mL.   During this procedure no sedation was administered.   Left Anterior Descending  Ost LAD  to Prox LAD lesion 100% stenosed  Ost LAD to Prox LAD lesion is 100% stenosed. The lesion is thrombotic.  Prox LAD lesion 100% stenosed  Prox LAD lesion is 100% stenosed. The lesion was previously treatedover 2 years ago.   First Diagonal Branch  Vessel is small in size.  1st Diag lesion 90% stenosed  1st Diag lesion is 90% stenosed.   Second Diagonal Branch  Vessel is moderate in size.   Third Diagonal Branch  Vessel is small in size.   Ramus Intermedius  Vessel is large.  Ramus lesion 90% stenosed  Ramus lesion is 90% stenosed. The lesion was previously treatedover 2 years ago.   Left Circumflex  Vessel is large.  Ost Cx to Prox Cx lesion 50% stenosed  Ost Cx to Prox Cx lesion is 50% stenosed.   First Obtuse Marginal Branch  Vessel is small in size.   Second Obtuse Marginal Branch  Vessel is small in size.   Third Obtuse Marginal Branch  Vessel is small in size.   Left Posterior Descending Artery  Vessel is small in size.   First Left Posterolateral Branch  Vessel is small in size.   Second Left Posterolateral Branch  Vessel is small in size.   Right Coronary Artery  Vessel is small. Vessel is angiographically normal.     Intervention   Ost LAD to Prox LAD lesion  Angioplasty (Also treats lesions: Prox LAD)  Balloon angioplasty was performed using a BALLOON SAPPHIRE [place] 3.0X15.  Post-Intervention Lesion Assessment  The intervention was successful. Pre-interventional TIMI flow is 0. Post-intervention TIMI flow is 3. No complications occurred at this lesion.  There is a 50% residual stenosis post intervention.   Prox LAD lesion  Angioplasty (Also treats lesions: Ost LAD to Prox LAD)  Balloon angioplasty was performed using a BALLOON SAPPHIRE [place] 3.0X15.  Post-Intervention Lesion Assessment  The intervention was successful. Pre-interventional TIMI flow is 0. Post-intervention TIMI flow is 3. No complications occurred at this lesion.  There is a 50%  residual stenosis post intervention.   Right Heart  Right Heart Pressures RA (mean): 6 mmHg RV (S/EDP): 43/6 mmHg PA (S/D, mean): 42/15 mmHg PCWP (mean): 15 mmHg  Ao sat: 98% PA sat: 68%  Fick CO: 4.4 L/min Fick CI: 2.3 L/min/m^2  Thermodilution CO: 3.9 L/min Thermodilution CI: 2.0 L/min/m^2   Impella/IABP  Hemodynamic Support An IABP was inserted post-PCI. Access site:  right femoral artery.   Left Ventricle The left ventricular size is normal. There is severe left ventricular systolic dysfunction. LV end diastolic pressure is mildly elevated. The left ventricular ejection fraction is 25-35% by visual estimate. There are LV function abnormalities due to segmental dysfunction.   Aortic Valve There is no aortic valve stenosis.     CHEST  1 VIEW  COMPARISON:  10/26/2019 at 0551 hours  FINDINGS: RIGHT arm PICC line tip projecting over SVC.  Enlargement of cardiac silhouette post CABG and LEFT atrial appendage clipping.  Pulmonary vascular congestion.  Bibasilar atelectasis and minimal pleural effusions.  No pneumothorax following thoracentesis.  Osseous structures unremarkable.  IMPRESSION: No pneumothorax following LEFT thoracentesis.  Bibasilar atelectasis and minimal pleural effusions.   Electronically Signed   By: Lavonia Dana M.D.   On: 10/26/2019 11:19  Treatments:   CARDIOTHORACIC SURGERY OPERATIVE NOTE  Date of Procedure:    10/17/2019  Preoperative Diagnosis:        Severe 3-vessel Coronary Artery Disease  Acute ST-segment Elevation Myocardial Infarction  Postoperative Diagnosis:    Same  Procedure:        Emergency Coronary Artery Bypass Grafting x 2              Left Internal Mammary Artery to Distal Left Anterior Descending Coronary Artery             Saphenous Vein Graft to Ramus Intermediate Branch Coronary Artery             Endoscopic Vein Harvest from Right Thigh             Clipping of left atrial appendage  (Atricure Pro245 left atrial clip)  Surgeon:        Valentina Gu. Roxy Manns, MD  Assistant:       Ellwood Handler, PA-C  Anesthesia:    Midge Minium, MD  Operative Findings: ? Moderate left ventricular systolic dysfunction with anterior and septal hypokinesis ? Diffuse pericardial adhesions c/w remote history of pericarditis ? Good quality left internal mammary artery conduit ? Good quality saphenous vein conduit ? Good quality target vessels for grafting    BRIEF CLINICAL NOTE AND INDICATIONS FOR SURGERY  Patient is a 74 year old African-American male with history of coronary artery disease,hypertension, hyperlipidemia, paroxysmal atrial fibrillation and lupus who is referred for emergent management of acute ST segment elevation myocardial infarction.  Patient's cardiac history dates back to 2008 when he presented with an acute anterior wall myocardial infarction. He underwent PCI and stenting x2 and reportedly had follow-up PCI in 2009. At the time he lived in [place] and details are not currently available. More recently the patient has been followed by Dr. 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 He has been maintaining sinus rhythm ever since and is not on long-term anticoagulation.  Patient states that he was in his usual state of health until today when he developed sudden onset of chest pain associated with shortness of breath after just having finished mowing his yard. Chest pain became progressively severe over 15 minutes, ultimately prompting the patient and his family to summon EMS. EKG revealed anterolateral ST segment elevation and the patient was brought directly to the Cath Lab following arrival in the emergency department. Diagnostic cardiac catheterization performed by Dr. Saunders Revel revealed acute 100% occlusion of the proximal left anterior descending coronary artery at site of  previous stent. There was 90% proximal stenosis of a large ramus intermediate branch. Intra-aortic balloon pump was placed and primary balloon angioplasty was performed restoring TIMI-3 flow in the left anterior descending coronary artery. Attempts to proceed with definitive PCI and stenting of the left anterior descending coronary artery were aborted due to extremely unfavorable anatomy. Right heart catheterization was performed and emergency cardiothoracic surgical consultation was requested.  Discharge Exam: Blood pressure 109/64, pulse 66, temperature 98.4 F (36.9 C), temperature source Oral, resp. rate 18, height _0  (1.778 m), weight 79.1 kg, SpO2 92 %.  General appearance: alert, cooperative and no distress Heart: regular rate and rhythm, S1, S2 normal, no murmur, click, rub or gallop Lungs: clear to auscultation bilaterally Abdomen: soft, non-tender; bowel sounds normal; no masses,  no organomegaly Extremities: extremities normal, atraumatic, no cyanosis or edema Wound: clean and dry  Disposition: Discharge disposition: 01-Home or Self Care       Discharge Instructions    AMB Referral to Cardiac Rehabilitation - Phase II   Complete by: As directed    Diagnosis: STEMI   After initial evaluation and assessments completed: Virtual Based Care may be provided alone or in conjunction with Phase 2 Cardiac Rehab based on patient barriers.: Yes   AMB Referral to Cardiac Rehabilitation - Phase II   Complete by: As directed    Diagnosis:  STEMI CABG     CABG X ___: 2   After initial evaluation and assessments completed: Virtual Based Care may be provided alone or in conjunction with Phase 2 Cardiac Rehab based on patient barriers.: Yes     Allergies as of 10/27/2019   No Known Allergies     Medication List    STOP taking these medications   ibuprofen 600 MG tablet Commonly known as: ADVIL     TAKE these medications   amiodarone 200 MG tablet Commonly known as:  PACERONE Please take 1 tab (223m) twice a day for 2 weeks and then 1 tab (205m daily until we see you in follow-up.   aspirin EC 81 MG tablet Take 1 tablet (81 mg total) by mouth daily.   azaTHIOprine 50 MG tablet Commonly known as: IMURAN Take 50 mg by mouth in the morning and at bedtime.   furosemide 40 MG tablet Commonly known as: LASIX Please take 1 tab (4070mtwice a day for 5 days then, take 1 tab (74m66maily. Remember to take last tab around dinner time .   hydroxychloroquine 200 MG tablet Commonly known as: PLAQUENIL Take 400 mg by mouth daily.   losartan 25 MG tablet Commonly known as: COZAAR Take 1 tablet (25 mg total) by mouth daily. Start taking on: Oct 28, 2019   oxyCODONE 5 MG immediate release tablet Commonly known as: Oxy IR/ROXICODONE Take 1 tablet (5 mg total) by mouth every 6 (six) hours as needed for severe pain.   potassium chloride SA 20 MEQ tablet Commonly known as: KLOR-CON Take 1 tab (20 MEQ) twice a day for 5 days with your lasix then take 1 tab (20MEQ) once a day.   rosuvastatin 40 MG tablet Commonly known as: CRESTOR Take 1 tablet (40 mg total) by mouth daily. Start taking on: Oct 28, 2019 What changed:   medication strength  See the new instructions.   sertraline 50 MG tablet Commonly known as: ZOLOFT Take 50 mg by mouth daily.   spironolactone 25 MG tablet Commonly known as: ALDACTONE Take 0.5 tablets (12.5 mg total) by mouth daily.   tamsulosin 0.4 MG Caps capsule Commonly known as: FLOMAX Take 0.4 mg by mouth daily.      Follow-up Information    [facility] [place] Follow up.   Specialty: Cardiothoracic Surgery Why: Your routine follow-up appointment is on 6/14 at 1:30pm. Please arrive at 1:00pm [facility] Contact information: [address]   Letv[place]enia Carbon. Call in 1 day(s).   Specialties: Internal Medicine, Pediatrics Contact information: [phone]        [place], PhilWonda Cheng Follow up.   Specialty: Cardiology Why: 11/04/2019 at 9:40am. Please bring your hospital paperwork  Contact information: [id number]  [address] [place] [address] [phone]           Signed: Elgie Collard,  PA-C 10/27/2019, 9:55 AM

## 2019-10-26 NOTE — Progress Notes (Addendum)
9 Days Post-Op Procedure(s) (LRB): CORONARY ARTERY BYPASS GRAFTING (CABG) using LIMA to LAD; Endoscopic harvest right greater saphenous vein: SVG to RAMUS. (N/A) Transesophageal Echocardiogram (Tee) Clipping Of Atrial Appendage using AtriCure [id number] 45 MM AtriClip. (N/A) Endovein Harvest Of Greater Saphenous Vein (Right) Subjective: Rested well, no new concerns.  Still on O2 at 1-2L/min.   Objective: Vital signs in last 24 hours: Temp:  [97.4 F (36.3 C)-98.9 F (37.2 C)] 98.9 F (37.2 C) (05/24 0754) Pulse Rate:  [60-89] 89 (05/24 0754) Cardiac Rhythm: Normal sinus rhythm (05/23 2051) Resp:  [17-20] 17 (05/24 0754) BP: (98-126)/(57-70) 126/70 (05/24 0754) SpO2:  [91 %-99 %] 97 % (05/24 0754) Weight:  [82.7 kg] 82.7 kg (05/24 0500)  Hemodynamic parameters for last 24 hours: CVP:  [10 mmHg-12 mmHg] 10 mmHg  Intake/Output from previous day: 05/23 0701 - 05/24 0700 In: 705 [P.O.:360; I.V.:345] Out: 800 [Urine:800] Intake/Output this shift: No intake/output data recorded.  Physical Exam: General appearance:alert, cooperative andnodistress Neurologic:intact Heart:in and out of afib / SR. Lungs:Breath sounds are clear anterior, O2 sats acceptable on2Lnc. CXR shows increasing left pleural effusion.  Abdomen:soft, non-tender Extremities:all warm and well perfused Wound:Thesternalincisionand bilateral EVH incisions arewell approximated and dry. PICC RUE, changed yesterday due to malfunction, site OK,   Lab Results: Recent Labs    10/26/19 0419  WBC 8.1  HGB 8.6*  HCT 27.4*  PLT 278   BMET:  Recent Labs    10/25/19 0659 10/26/19 0419  NA 139 139  K 4.8 4.3  CL 102 95*  CO2 30 28  GLUCOSE 98 124*  BUN 20 18  CREATININE 1.06 1.11  CALCIUM 7.9* 7.9*    PT/INR: No results for input(s): LABPROT, INR in the last 72 hours. ABG    Component Value Date/Time   PHART 7.371 10/18/2019 0224   HCO3 20.5 10/18/2019 0224   TCO2 22 10/18/2019 0224   ACIDBASEDEF 4.0 (H) 10/18/2019 0224   O2SAT 59.4 10/26/2019 0700   CBG (last 3)  Recent Labs    10/25/19 0627 10/25/19 1143 10/25/19 1605  GLUCAP 91 89 79    Assessment/Plan: S/P Procedure(s) (LRB): CORONARY ARTERY BYPASS GRAFTING (CABG) using LIMA to LAD; Endoscopic harvest right greater saphenous vein: SVG to RAMUS. (N/A) Transesophageal Echocardiogram (Tee) Clipping Of Atrial Appendage using AtriCure [id number] 45 MM AtriClip. (N/A) Endovein Harvest Of Greater Saphenous Vein (Right)  -POD-9emergency CABG x 2 after presenting with acute STEMI. IABP removed POD-1.Progressing with ambulation.Stable off milrinone.CoOx pending 59. Metoprolol held for near syncopal episode 5/21.  -Post-op afib-initiallyconverted back to SR on IVamiodaroneand wasconverted to PO amio. Afib recurredso he was re-bolused and placed back on the infusion on 5/20.Marland KitchenContinues to have PAF.K+4.3today and Planning eventualdischarge on Eliquis and ASA 81mg .Convert to PO amiodarone today.  -Mild respiratory insufficiency- CXR shows increasing left pleural effusion. Will ask IR to do left thoracentesis.  --Nausea-resolved  -Expected ABL anemia- Hctacceptable, monitor.  -DVT PPX-On daily enoxaparin.   -History of lupus- Imuran, Plaquenilresumed.  -Possible discharge tomorrow   LOS: 9 days    Antony Odea, PA-C 10/26/2019   I have seen and examined the patient and agree with the assessment and plan as outlined.  Back in A[place]utter this morning w/ controlled rate.  Agree w/ converting amiodarone to oral. Proceed with therapeutic thoracentesis for left pleural effusion.  Possible d/c home 1-2 days if rhythm stable and off O2.  Low dose ASA and Eliquis at hospital d/c.  Rexene Alberts, MD 10/26/2019 8:18 AM

## 2019-10-26 NOTE — Progress Notes (Addendum)
Patient ID: Cody Arellano, male   DOB: Apr 13, 1946, 74 y.o.   MRN: [id number]     Advanced Heart Failure Rounding Note  PCP-Cardiologist: Mertie Moores, MD   Subjective:    Anterior STEMI at admission, LHC with occluded ostial LAD (in-stent), 90% ostial ramus, 60-70% proximal LCx, nondominant RCA.  PTCA to LAD and ramus to restore flow, then CABG with LIMA-LAD and SVG-ramus. Echo pre-op with EF 65-70%, normal RV function, bicuspid AoV with mild AS and mild AI.   IABP removed 5/17.  Post-op echo with EF 45-50%, septal hypokinesis.   Post op recovery c/b PAF. Currently NSR. Remains on amiodarone gtt 30 mg/hr.    Co-ox marginal off milrinone, 59% today.    CVP ~9   Feels ok this morning. Only complaint is exertional dyspnea.   Objective:   Weight Range: 82.7 kg Body mass index is 26.16 kg/m.   Vital Signs:   Temp:  [97.4 F (36.3 C)-98.4 F (36.9 C)] 98.4 F (36.9 C) (05/24 0415) Pulse Rate:  [60-78] 64 (05/24 0415) Resp:  [17-20] 17 (05/24 0500) BP: (98-113)/(57-65) 102/65 (05/24 0415) SpO2:  [91 %-99 %] 96 % (05/24 0415) Weight:  [82.7 kg] 82.7 kg (05/24 0500) Last BM Date: 10/22/19  Weight change: Filed Weights   10/24/19 0700 10/25/19 0450 10/26/19 0500  Weight: 83.4 kg 81.6 kg 82.7 kg    Intake/Output:   Intake/Output Summary (Last 24 hours) at 10/26/2019 0727 Last data filed at 10/26/2019 0646 Gross per 24 hour  Intake 705.03 ml  Output 800 ml  Net -94.97 ml      Physical Exam    CVP 9  General:  Elderly AAM, sitting up in bed.  No resp difficulty HEENT: normal Neck: supple. JVP 9-10 cm . Carotids 2+ bilat; no bruits. No lymphadenopathy or thryomegaly appreciated. Cor: PMI nondisplaced. Irregularly irregular rhythm, regular rate. No rubs, gallops or murmurs. Sternal incision  Lungs:  CTAB Abdomen: soft, nontender, nondistended. No hepatosplenomegaly. No bruits or masses. Good bowel sounds. Extremities: no cyanosis, clubbing, rash, no edema. + RUE  PICC Neuro: alert & orientedx3, cranial nerves grossly intact. moves all 4 extremities w/o difficulty. Affect pleasant   Telemetry   Atrial fibrillation 70s   Labs    CBC Recent Labs    10/26/19 0419  WBC 8.1  HGB 8.6*  HCT 27.4*  MCV 102.2*  PLT 0000000   Basic Metabolic Panel Recent Labs    10/24/19 0400 10/25/19 0659  NA 138 139  K 4.3 4.8  CL 99 102  CO2 29 30  GLUCOSE 117* 98  BUN 24* 20  CREATININE 1.08 1.06  CALCIUM 7.9* 7.9*   Liver Function Tests No results for input(s): AST, ALT, ALKPHOS, BILITOT, PROT, ALBUMIN in the last 72 hours. No results for input(s): LIPASE, AMYLASE in the last 72 hours. Cardiac Enzymes No results for input(s): CKTOTAL, CKMB, CKMBINDEX, TROPONINI in the last 72 hours.  BNP: BNP (last 3 results) No results for input(s): BNP in the last 8760 hours.  ProBNP (last 3 results) No results for input(s): PROBNP in the last 8760 hours.   D-Dimer No results for input(s): DDIMER in the last 72 hours. Hemoglobin A1C No results for input(s): HGBA1C in the last 72 hours. Fasting Lipid Panel No results for input(s): CHOL, HDL, LDLCALC, TRIG, CHOLHDL, LDLDIRECT in the last 72 hours. Thyroid Function Tests No results for input(s): TSH, T4TOTAL, T3FREE, THYROIDAB in the last 72 hours.  Invalid input(s): FREET3  Other results:   Imaging  [place] EKG SITE RITE  Result Date: 10/25/2019 If [facility] image not attached, placement could not be confirmed due to current cardiac rhythm.    Medications:     Scheduled Medications: . aspirin EC  81 mg Oral Daily  . azaTHIOprine  50 mg Oral Daily  . bisacodyl  10 mg Oral Daily   Or  . bisacodyl  10 mg Rectal Daily  . Chlorhexidine Gluconate Cloth  6 each Topical Daily  . Chlorhexidine Gluconate Cloth  6 each Topical Daily  . digoxin  0.125 mg Oral Daily  . docusate sodium  200 mg Oral Daily  . enoxaparin (LOVENOX) injection  30 mg Subcutaneous QHS  . furosemide  40 mg Oral Daily  .  hydroxychloroquine  200 mg Oral QHS  . losartan  12.5 mg Oral Daily  . pantoprazole  40 mg Oral Daily  . potassium chloride  20 mEq Oral Daily  . rosuvastatin  40 mg Oral Daily  . sertraline  50 mg Oral QHS  . sodium chloride flush  10-40 mL Intracatheter Q12H  . sodium chloride flush  3 mL Intravenous Q12H  . spironolactone  12.5 mg Oral Daily  . tamsulosin  0.4 mg Oral QHS    Infusions: . sodium chloride    . amiodarone 30 mg/hr (10/25/19 2057)    PRN Medications: sodium chloride, metoprolol tartrate, ondansetron (ZOFRAN) IV, oxyCODONE, promethazine, sodium chloride flush, sodium chloride flush, traMADol   Assessment/Plan   1. CAD: Prior history of MI with LAD and ramus PCI.  Admitted with anterior STEMI. LHC with occluded ostial LAD (in-stent), 90% ostial ramus, 60-70% proximal LCx, nondominant RCA.  PTCA to LAD and ramus to restore flow, then CABG with LIMA-LAD and SVG-ramus.  - stable w/o CP  - Continue ASA, statin.  2. Acute systolic CHF: Post-op echo with EF 45-50%, septal hypokinesis, bicuspid AoV with mild AS and mild AI.  IABP out. Now off milrinone. CO-OX 59%  - CVP 9-10. Still above preopt wt and continues w/ DOE - Give 40 mg IV Lasix x 2 today then start 40 mg po bid tomorrow.    - Hold metoprolol with presyncopal episode and marginal co-ox.  3. Atrial fibrillation: Paroxysmal post-op.  In NSR currenty.  - transition to PO amiodarone today 200 mg bid x 10 days>>200 mg daily - Continue digoxin 0.125 mg daily. Check dig level in am  - Plan for Eliquis initiation closer to discharge.  4. SLE: History of SLE pericarditis.  He is back on home Plaquenil and Imuran.  5. Bicuspid aortic valve: Mild AS and mild AI.  6. AKI: Creatinine normalized. 1.1 - continue to monitor w/ diuresis   Length of Stay: [address], PA-C  10/26/2019, 7:27 AM  Advanced Heart Failure Team Pager [phone] (M-F; 7a - 4p)  Please contact [facility] for night-coverage after hours  (4p -7a ) and weekends on [email or web address]  Patient seen with PA, agree with the above note.   He is in NSR today on amiodarone gtt.  CVP 9, remains on 3L oxygen by [place].  Some dyspnea with exertion. BP stable, creatinine 1.1.   General: NAD Neck: JVP 9-10 cm, no thyromegaly or thyroid nodule.  Lungs: Clear to auscultation bilaterally with normal respiratory effort. CV: Nondisplaced PMI.  Heart regular S1/S2, no S3/S4, no murmur.  No peripheral edema.   Abdomen: Soft, nontender, no hepatosplenomegaly, no distention.  Skin: Intact without lesions or rashes.  Neurologic: Alert and oriented x 3.  Psych: Normal affect. Extremities: No clubbing or cyanosis.  HEENT: Normal.   Lasix 40 mg IV bid today then 40 mg po bid tomorrow.  Will see if we can get him off oxygen.   Continue spironolactone, increase losartan to 25 mg daily.   He is in NSR on IV amiodarone, transition to po today.  Start Eliquis 5 mg bid when ok with surgical team.   Continue to work with PT/cardiac rehab, hopefully home soon.   Loralie Champagne 10/26/2019 8:11 AM

## 2019-10-26 NOTE — Progress Notes (Signed)
Mobility Specialist - Progress Note   10/26/19 1330  Mobility  Activity Ambulated in hall  Level of Assistance Modified independent, requires aide device or extra time  Assistive Device Front wheel walker  Distance Ambulated (ft) 510 ft  Mobility Response Tolerated well  Mobility performed by Mobility specialist  $Mobility charge 1 Mobility    Pre-mobility: 80 HR, 95% SpO2 During mobility: 89% SpO2 Post-mobility: 100 HR, 93% SPO2  Pt walked with a RW on RA, he endorses some fatigue towards the end of the walk but said he was otherwise asx. I left him sitting in his chair with his call bell and phone in his lap.   [place] Specialist

## 2019-10-27 ENCOUNTER — Other Ambulatory Visit: Payer: Self-pay | Admitting: Surgical

## 2019-10-27 ENCOUNTER — Other Ambulatory Visit: Payer: Self-pay | Admitting: Physician Assistant

## 2019-10-27 ENCOUNTER — Inpatient Hospital Stay (HOSPITAL_COMMUNITY): Payer: Medicare Other

## 2019-10-27 ENCOUNTER — Telehealth: Payer: Self-pay

## 2019-10-27 LAB — DIGOXIN LEVEL: Digoxin Level: 0.6 ng/mL — ABNORMAL LOW (ref 0.8–2.0)

## 2019-10-27 LAB — BASIC METABOLIC PANEL
Anion gap: 8 (ref 5–15)
BUN: 18 mg/dL (ref 8–23)
CO2: 30 mmol/L (ref 22–32)
Calcium: 7.8 mg/dL — ABNORMAL LOW (ref 8.9–10.3)
Chloride: 99 mmol/L (ref 98–111)
Creatinine, Ser: 1.19 mg/dL (ref 0.61–1.24)
GFR calc Af Amer: >60 mL/min (ref >60)
GFR calc non Af Amer: 60 mL/min — ABNORMAL LOW (ref >60)
Glucose, Bld: 91 mg/dL (ref 70–99)
Potassium: 4.1 mmol/L (ref 3.5–5.1)
Sodium: 137 mmol/L (ref 135–145)

## 2019-10-27 MED ORDER — APIXABAN 2.5 MG PO TABS
2.5000 mg | ORAL_TABLET | Freq: Two times a day (BID) | ORAL | 1 refills | Status: DC
Start: 2019-10-27 — End: 2020-02-02

## 2019-10-27 MED ORDER — OXYCODONE HCL 5 MG PO TABS: 5.0000 mg | ORAL_TABLET | Freq: Four times a day (QID) | ORAL | 0 refills | Status: DC | PRN

## 2019-10-27 MED ORDER — POTASSIUM CHLORIDE CRYS ER 20 MEQ PO TBCR: EXTENDED_RELEASE_TABLET | ORAL | 1 refills | Status: DC

## 2019-10-27 MED ORDER — SPIRONOLACTONE 25 MG PO TABS: 12.5000 mg | ORAL_TABLET | Freq: Every day | ORAL | 1 refills | Status: DC

## 2019-10-27 MED ORDER — ROSUVASTATIN CALCIUM 40 MG PO TABS: 40.0000 mg | ORAL_TABLET | Freq: Every day | ORAL | 1 refills | Status: DC

## 2019-10-27 MED ORDER — FUROSEMIDE 40 MG PO TABS: ORAL_TABLET | ORAL | 1 refills | Status: DC

## 2019-10-27 MED ORDER — LOSARTAN POTASSIUM 25 MG PO TABS: 25.0000 mg | ORAL_TABLET | Freq: Every day | ORAL | 1 refills | Status: DC

## 2019-10-27 MED ORDER — AMIODARONE HCL 200 MG PO TABS: ORAL_TABLET | ORAL | 1 refills | Status: DC

## 2019-10-27 NOTE — Progress Notes (Signed)
Pt chest tube sutures removed per order. Tolerated well. Painted with Betadine along with all incisions. Jerald Kief, RN

## 2019-10-27 NOTE — Telephone Encounter (Signed)
1st attempt- Left HIPAA complaint message on voicemail to return my call- need to complete TCM and schedule follow up visit.

## 2019-10-27 NOTE — Care Management Important Message (Signed)
Important Message  Patient Details  Name: Cody Arellano MRN: [id number] Date of Birth: 12-17-1945   Medicare Important Message Given:  Yes     Shelda Altes 10/27/2019, 1:09 PM

## 2019-10-27 NOTE — Progress Notes (Signed)
Patient ID: Cody Arellano, male   DOB: 1945-12-29, 74 y.o.   MRN: [id number]     Advanced Heart Failure Rounding Note  PCP-Cardiologist: Mertie Moores, MD   Subjective:    Anterior STEMI at admission, LHC with occluded ostial LAD (in-stent), 90% ostial ramus, 60-70% proximal LCx, nondominant RCA.  PTCA to LAD and ramus to restore flow, then CABG with LIMA-LAD and SVG-ramus. Echo pre-op with EF 65-70%, normal RV function, bicuspid AoV with mild AS and mild AI.   IABP removed 5/17.  Post-op echo with EF 45-50%, septal hypokinesis.   Post op recovery c/b PAF. Currently NSR on po amiodarone.    He diuresed well yesterday on IV Lasix, back on po.  Now off oxygen.    Left thoracentesis yesterday, 1 L off.   Feels ok this morning, no dyspnea at rest.   Objective:   Weight Range: 79.1 kg Body mass index is 25.01 kg/m.   Vital Signs:   Temp:  [98 F (36.7 C)-98.7 F (37.1 C)] 98.4 F (36.9 C) (05/25 0749) Pulse Rate:  [63-73] 66 (05/24 2330) Resp:  [14-20] 18 (05/24 2330) BP: (97-109)/(52-64) 109/64 (05/25 0749) SpO2:  [91 %-98 %] 92 % (05/25 0749) Weight:  [79.1 kg] 79.1 kg (05/25 0500) Last BM Date: 10/25/19  Weight change: Filed Weights   10/25/19 0450 10/26/19 0500 10/27/19 0500  Weight: 81.6 kg 82.7 kg 79.1 kg    Intake/Output:   Intake/Output Summary (Last 24 hours) at 10/27/2019 0911 Last data filed at 10/27/2019 0700 Gross per 24 hour  Intake 420 ml  Output 2250 ml  Net -1830 ml      Physical Exam    General: NAD Neck: JVP 8 cm, no thyromegaly or thyroid nodule.  Lungs: Mildly decreased at bases.  CV: Nondisplaced PMI.  Heart regular S1/S2, no S3/S4, 1/6 SEM RUSB.  No peripheral edema.   Abdomen: Soft, nontender, no hepatosplenomegaly, no distention.  Skin: Intact without lesions or rashes.  Neurologic: Alert and oriented x 3.  Psych: Normal affect. Extremities: No clubbing or cyanosis.  HEENT: Normal.     Telemetry   NSR 60s (personally  reviewed)  Labs    CBC Recent Labs    10/26/19 0419  WBC 8.1  HGB 8.6*  HCT 27.4*  MCV 102.2*  PLT 0000000   Basic Metabolic Panel Recent Labs    10/26/19 0419 10/27/19 0500  NA 139 137  K 4.3 4.1  CL 95* 99  CO2 28 30  GLUCOSE 124* 91  BUN 18 18  CREATININE 1.11 1.19  CALCIUM 7.9* 7.8*   Liver Function Tests No results for input(s): AST, ALT, ALKPHOS, BILITOT, PROT, ALBUMIN in the last 72 hours. No results for input(s): LIPASE, AMYLASE in the last 72 hours. Cardiac Enzymes No results for input(s): CKTOTAL, CKMB, CKMBINDEX, TROPONINI in the last 72 hours.  BNP: BNP (last 3 results) No results for input(s): BNP in the last 8760 hours.  ProBNP (last 3 results) No results for input(s): PROBNP in the last 8760 hours.   D-Dimer No results for input(s): DDIMER in the last 72 hours. Hemoglobin A1C No results for input(s): HGBA1C in the last 72 hours. Fasting Lipid Panel No results for input(s): CHOL, HDL, LDLCALC, TRIG, CHOLHDL, LDLDIRECT in the last 72 hours. Thyroid Function Tests No results for input(s): TSH, T4TOTAL, T3FREE, THYROIDAB in the last 72 hours.  Invalid input(s): FREET3  Other results:   Imaging    DG Chest 1 View  Result Date: 10/26/2019 CLINICAL  DATA:  Post LEFT thoracentesis; history dysrhythmia, hypertension, lupus, lupus pericarditis, MI, post CABG and LEFT atrial appendage clipping EXAM: CHEST  1 VIEW COMPARISON:  10/26/2019 at 0551 hours FINDINGS: RIGHT arm PICC line tip projecting over SVC. Enlargement of cardiac silhouette post CABG and LEFT atrial appendage clipping. Pulmonary vascular congestion. Bibasilar atelectasis and minimal pleural effusions. No pneumothorax following thoracentesis. Osseous structures unremarkable. IMPRESSION: No pneumothorax following LEFT thoracentesis. Bibasilar atelectasis and minimal pleural effusions. Electronically Signed   By: Lavonia Dana M.D.   On: 10/26/2019 11:19   DG Chest 2 View  Result Date:  10/27/2019 CLINICAL DATA:  Pleural effusion EXAM: CHEST - 2 VIEW COMPARISON:  Oct 26, 2019 FINDINGS: Central catheter tip is in the superior vena cava. No pneumothorax. There is a small left pleural effusion with atelectatic change in each lower lung zone. Interstitial prominence is noted in the bases, likely a degree of interstitial pulmonary edema. There is no airspace consolidation. Heart is mildly enlarged with mild pulmonary venous hypertension. Patient is status post coronary artery bypass grafting. There is a left atrial appendage clamp. There is aortic atherosclerosis. No adenopathy. No bone lesions. IMPRESSION: Small left pleural effusion with interstitial edema in the bases. There is cardiomegaly with pulmonary vascular congestion. Suspect a degree of congestive heart failure. There is bibasilar atelectasis. No consolidation. Postoperative changes noted. Aortic Atherosclerosis (ICD10-I70.0). Electronically Signed   By: Lowella Grip III M.D.   On: 10/27/2019 08:43   IR THORACENTESIS ASP PLEURAL SPACE W/IMG GUIDE  Result Date: 10/26/2019 INDICATION: Patient has history of STEMI status post CABG x2 found to have a left-sided pleural effusion presents for therapeutic thoracentesis EXAM: ULTRASOUND GUIDED THERAPEUTIC THORACENTESIS MEDICATIONS: Lidocaine 1% 10 mL COMPLICATIONS: None immediate. PROCEDURE: An ultrasound guided thoracentesis was thoroughly discussed with the patient and questions answered. The benefits, risks, alternatives and complications were also discussed. The patient understands and wishes to proceed with the procedure. Written consent was obtained. Ultrasound was performed to localize and mark an adequate pocket of fluid in the left-sided chest. The area was then prepped and draped in the normal sterile fashion. 1% Lidocaine was used for local anesthesia. Under ultrasound guidance a 6 Fr Safe-T-Centesis catheter was introduced. Thoracentesis was performed. The catheter was removed  and a dressing applied. FINDINGS: A total of approximately 7 mL of serosanguineous fluid was removed. IMPRESSION: Successful ultrasound guided left-sided therapeutic thoracentesis yielding 700 mL of pleural fluid. Read by: Rushie Nyhan, NP Electronically Signed   By: Corrie Mckusick D.O.   On: 10/26/2019 14:27     Medications:     Scheduled Medications: . amiodarone  200 mg Oral BID  . aspirin EC  81 mg Oral Daily  . azaTHIOprine  50 mg Oral Daily  . bisacodyl  10 mg Oral Daily   Or  . bisacodyl  10 mg Rectal Daily  . Chlorhexidine Gluconate Cloth  6 each Topical Daily  . Chlorhexidine Gluconate Cloth  6 each Topical Daily  . digoxin  0.125 mg Oral Daily  . docusate sodium  200 mg Oral Daily  . enoxaparin (LOVENOX) injection  30 mg Subcutaneous QHS  . furosemide  40 mg Oral BID  . hydroxychloroquine  200 mg Oral QHS  . losartan  25 mg Oral Daily  . pantoprazole  40 mg Oral Daily  . potassium chloride  20 mEq Oral Daily  . rosuvastatin  40 mg Oral Daily  . sertraline  50 mg Oral QHS  . sodium chloride flush  10-40 mL  Intracatheter Q12H  . sodium chloride flush  3 mL Intravenous Q12H  . spironolactone  12.5 mg Oral Daily  . tamsulosin  0.4 mg Oral QHS    Infusions: . sodium chloride      PRN Medications: sodium chloride, lidocaine, metoprolol tartrate, ondansetron (ZOFRAN) IV, oxyCODONE, promethazine, sodium chloride flush, sodium chloride flush, traMADol   Assessment/Plan   1. CAD: Prior history of MI with LAD and ramus PCI.  Admitted with anterior STEMI. LHC with occluded ostial LAD (in-stent), 90% ostial ramus, 60-70% proximal LCx, nondominant RCA.  PTCA to LAD and ramus to restore flow, then CABG with LIMA-LAD and SVG-ramus. No chest pain.  - Continue ASA, statin.  2. Acute systolic CHF: Post-op echo with EF 45-50%, septal hypokinesis, bicuspid AoV with mild AS and mild AI.  IABP out. Now off milrinone.  On exam, he looks near-euvolemic and weight is back to near his  baseline. He is on room air.  - Would continue Lasix 40 mg po bid.  - Continue current losartan and spironolactone.   - Think we can stop digoxin.  3. Atrial fibrillation: Paroxysmal post-op.  In NSR currenty.  - Continue amiodarone 200 mg po bid x 2 wks, then 200 mg daily.  - Plan for Eliquis initiation closer to discharge.  4. SLE: History of SLE pericarditis.  He is back on home Plaquenil and Imuran.  5. Bicuspid aortic valve: Mild AS and mild AI.  6. AKI: Creatinine back to baseline.  7. Pleural effusion: Left thoracentesis 5/24.   Home soon  Length of Stay: [place], MD  10/27/2019, 9:11 AM  Advanced Heart Failure Team Pager [phone] (M-F; [place])  Please contact [facility] for night-coverage after hours (4p -7a ) and weekends on [email or web address]

## 2019-10-27 NOTE — Progress Notes (Signed)
CARDIAC REHAB PHASE I   PRE:  Rate/Rhythm: SR 66  BP:  Sitting: 107/72        SaO2: 95% RA  MODE:  Ambulation: 400 ft   POST:  Rate/Rhythm: SR 70  BP:  Sitting: 107/57        SaO2: 93% RA  1050 -1216  Pt in bed, agrees to ambulate. Pt s/p thoracentesis yesterday, feeling well and SaO2 95% on RA. Pt ambulates with steady gait using rolling walker. Sao2 during walk ranged from 90-92%. Pt had no c/o SOB with walk. I/S encouraged 10x/hr. I/S = 1250 ml.  Pt back to bed for PICC line removal. Education S/P CABG X 2 provided to patient and spouse. Reviewed "in the tube" precautions with patient. Also dicussed S/S to report to MD, bathing/wound care, safety precautions, weight restrictions.. Risk factors reviewed were HTN and Lipids: disussed heart healthy diet and low Na diet, provided handouts on each. Dicussed walking daily, RPE scale and weather parameters, S/S to stop exercise. Discussed the emotional impact of surgery/CAD. Pt/spouse verbalized understanding of education and all questions answered. Pt encouraged to attend [facility] and a referral was sent to [facility].   Lesly Rubenstein, MS, ACSM EP-C, [facility] 10/27/2019  12:01 PM

## 2019-10-27 NOTE — Progress Notes (Signed)
      [address]       [place] [address]             [phone]     I have sent a prescription for Eliquis 2.5 mg po BID to this patient's pharmacy.  I attempted to contact the patient and his wife regarding the need to be taking Eliquis.  There is no response and I did leave a message on the wife's phone.  I have asked Caryl Pina the nurse at our office to also try to contact the patient regarding this.  I left a message for the patient to call me if they had any questions or concerns related to the message.  John Giovanni, PA-C

## 2019-10-27 NOTE — Progress Notes (Addendum)
      [address]       [place] [address]             [phone]      10 Days Post-Op Procedure(s) (LRB): CORONARY ARTERY BYPASS GRAFTING (CABG) using LIMA to LAD; Endoscopic harvest right greater saphenous vein: SVG to RAMUS. (N/A) Transesophageal Echocardiogram (Tee) Clipping Of Atrial Appendage using AtriCure [id number] 45 MM AtriClip. (N/A) Endovein Harvest Of Greater Saphenous Vein (Right) Subjective: Feels good this morning. Looking forward to going home.   Objective: Vital signs in last 24 hours: Temp:  [98 F (36.7 C)-98.7 F (37.1 C)] 98.4 F (36.9 C) (05/25 0749) Pulse Rate:  [63-73] 66 (05/24 2330) Cardiac Rhythm: Normal sinus rhythm (05/25 0811) Resp:  [14-20] 18 (05/24 2330) BP: (97-109)/(52-64) 109/64 (05/25 0749) SpO2:  [91 %-98 %] 92 % (05/25 0749) Weight:  [79.1 kg] 79.1 kg (05/25 0500)  Hemodynamic parameters for last 24 hours: CVP:  [6 mmHg-8 mmHg] 8 mmHg  Intake/Output from previous day: 05/24 0701 - 05/25 0700 In: 673 [P.O.:660; I.V.:13] Out: 2250 [Urine:2250] Intake/Output this shift: No intake/output data recorded.  General appearance: alert, cooperative and no distress Heart: regular rate and rhythm, S1, S2 normal, no murmur, click, rub or gallop Lungs: clear to auscultation bilaterally Abdomen: soft, non-tender; bowel sounds normal; no masses,  no organomegaly Extremities: extremities normal, atraumatic, no cyanosis or edema Wound: clean and dry  Lab Results: Recent Labs    10/26/19 0419  WBC 8.1  HGB 8.6*  HCT 27.4*  PLT 278   BMET:  Recent Labs    10/26/19 0419 10/27/19 0500  NA 139 137  K 4.3 4.1  CL 95* 99  CO2 28 30  GLUCOSE 124* 91  BUN 18 18  CREATININE 1.11 1.19  CALCIUM 7.9* 7.8*    PT/INR: No results for input(s): LABPROT, INR in the last 72 hours. ABG    Component Value Date/Time   PHART 7.371 10/18/2019 0224   HCO3 20.5 10/18/2019 0224   TCO2 22 10/18/2019 0224   ACIDBASEDEF 4.0 (H)  10/18/2019 0224   O2SAT 59.4 10/26/2019 0700   CBG (last 3)  Recent Labs    10/25/19 1143 10/25/19 1605 10/26/19 1114  GLUCAP 89 79 91    Assessment/Plan: S/P Procedure(s) (LRB): CORONARY ARTERY BYPASS GRAFTING (CABG) using LIMA to LAD; Endoscopic harvest right greater saphenous vein: SVG to RAMUS. (N/A) Transesophageal Echocardiogram (Tee) Clipping Of Atrial Appendage using AtriCure [id number] 45 MM AtriClip. (N/A) Endovein Harvest Of Greater Saphenous Vein (Right)  1. CV-Currently in NSR, continue PO Amio 2. Pulm- Off oxygen with good saturation. Continue use of incentive spirometer. Thoracentesis on the left with 1L removed. CXR stable.  3. Renal-creatinine 1.19, continue lasix BID for 5 days, then daily per HF. Remains fluid overloaded. 4. H and H- expected acute blood loss anemia. Recovering 8.6/27.4 5. Endo- blood glucose well controlled  Plan: Feels good this morning. Okay to discharge today. Spoke with HF about medications and diuretics for home.    LOS: 10 days    Elgie Collard 10/27/2019   I have seen and examined the patient and agree with the assessment and plan as outlined.  Rexene Alberts, MD 10/27/2019 11:36 AM

## 2019-10-27 NOTE — Progress Notes (Signed)
PT provided discharge instructions and education. IV removed and intact. Vital stable. Telebox removed/cc,d notified. Pt denies complaints. Wife at bedside also informed of d/c info. Pt has all belongings. Volunteers called to tx via wheelchair to [employer]. Jerald Kief, RN

## 2019-10-28 NOTE — Telephone Encounter (Signed)
Please follow up  He had CABG and probably just needs his cardiology and surgery appointments (not a separate one here)----but we should at least touch base and make sure he is doing okay at home

## 2019-10-28 NOTE — Telephone Encounter (Signed)
LVM

## 2019-10-28 NOTE — Telephone Encounter (Signed)
2nd/final attempt- Left message again on voicemail to return my call- need to complete TCM and schedule hospital follow up visit.

## 2019-11-03 NOTE — Telephone Encounter (Signed)
LVM

## 2019-11-04 ENCOUNTER — Encounter: Payer: Self-pay | Admitting: Cardiovascular Disease

## 2019-11-04 ENCOUNTER — Other Ambulatory Visit: Payer: Self-pay

## 2019-11-04 ENCOUNTER — Ambulatory Visit (INDEPENDENT_AMBULATORY_CARE_PROVIDER_SITE_OTHER): Payer: Federal, State, Local not specified - PPO | Admitting: Cardiovascular Disease

## 2019-11-04 VITALS — BP 104/58 | HR 66 | Ht 72.0 in | Wt 162.0 lb

## 2019-11-04 DIAGNOSIS — I2510 Atherosclerotic heart disease of native coronary artery without angina pectoris: Secondary | ICD-10-CM | POA: Diagnosis not present

## 2019-11-04 DIAGNOSIS — I509 Heart failure, unspecified: Secondary | ICD-10-CM | POA: Diagnosis not present

## 2019-11-04 DIAGNOSIS — I480 Paroxysmal atrial fibrillation: Secondary | ICD-10-CM | POA: Diagnosis not present

## 2019-11-04 NOTE — Patient Instructions (Addendum)
Stool softeners - Colase - over the counter Sugar free metamucil  Mag citrate ( bottle that you drink to help you start having bowel movements )  Your physician has requested that you have an echocardiogram. Echocardiography is a painless test that uses sound waves to create images of your heart. It provides your doctor with information about the size and shape of your heart and how well your heart's chambers and valves are working. This procedure takes approximately one hour. There are no restrictions for this procedure.  2 -3 MONTHS ECHO 1-2 DAYS PRIOR TO APPT WITH DR [place] physician recommends that you schedule a follow-up appointment in:  2-3 MONTHS WITH DR Acie Fredrickson

## 2019-11-04 NOTE — Progress Notes (Signed)
Cardiology Office Note   Date:  11/04/2019   ID:  Cody Arellano, DOB 01-Feb-1946, MRN [id number]  PCP:  Venia Carbon, MD  Cardiologist:   Mertie Moores, MD   Chief Complaint  Patient presents with  . Coronary Artery Disease  . Atrial Fibrillation  . Hyperlipidemia   1. CAD - mi in 2008 2. Lupus Pericarditis 3. Hypertension 4. Hyperlipidemia   Sept. 8, 2015:  Cody Arellano was seen in Nov. 2014 for follow up of his Lupus pericarditis.  Cody Arellano has had significant worsening of DOE since I last saw him. He has not had to take any nitroglycerin. He does think the symptoms are very similar to when he had his MI.  He denies any PND orthopnea. He denies any pedal edema. He tries to avoid salt but it makes it he eats some salt on occasion.   August 10, 2014 Cody Arellano is a 74 y.o. 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 He also has a history of coronary artery disease, hypertension, and hyperlipidemia.  Has bilateral foot burning - not necessarily with ambulation No CP ,  Watches his diet Walks several miles a day 4-5 times a week.   August 24, 2015:  Has remained active.   No CP or dyspnea . Walks regularly  No additional symptoms of pericarditis   Oct. 2,2019:  Cody Arellano is seen back today after a 2-1/2-year absence.  He has a history of lupus pericarditis.  He also has a history of coronary artery disease  Her symptoms have a history of an acute myocardial infarction in 2008 at which time he got 2 stents.  Follow-up In 2009 resulted in placement of one additional stent.  He was hospitalized in February in [place] for acute pericarditis as a complication of his lupus.  Seen 2014 with atrial fibrillation.  He was thought to have pericarditis as well at that time.  Recently , he has done well .   No recent pericarditis.  Is on Plaquenil and Imuran  for his Lupus.  Has had Lupus pericardiits twice.  Takes Motrin 600 TIE when he has pericarditis.   Has rare  episodes of CP - usually when he wakes up in the am. Typically goes away when he stops and rests.   Feels like an early symptom of when he had the MI .   Tries to walk 3-4 days a week - walks 2 miles  No CP with walking    Has some balance issues.   Is not on any cardiac meds.   November 04, 2019:  Cody Arellano is seen today for follow-up of his coronary artery disease, hyperlipidemia, atrial fibrillation.  He has had lupus pericarditis several years ago.  He had CABG several weeks ago.   ( May 15) Cody Arellano )  Had presented with worsening cp Cath by Dr. And on May 15 revealed severe multivalve vessel disease including 100% thrombotic occlusion of the ostial LAD and a 90% ostial ramus intermediate vessel.  He had a 50% ostial left circumflex stenosis.  He had severely reduced left ventricular systolic function with ejection fraction of 35-20 5 to 30%.  He had successful PTCA to the ostial LAD and was referred for bypass grafting. Is on amio 200 mg a day for post op afib Also on Lasix for the next several weeks.  I gave the OK for him to stop the lasix once his current bottle runs out.    He will  call if his shortness of breath returns   Past Medical History:  Diagnosis Date  . Anxiety   . Chronic tension headache   . Colon polyps   . Coronary artery disease 2008/2009   MI with PCI x 2, then PCI x 1 in 2009  . Depression   . Dyslipidemia   . Dysrhythmia    atrial fibrillation  . Hyperlipidemia   . Hypertension   . Lupus [facility])    sees Dr Trudie Reed  . Lupus disease of the lung   . 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 Myocardial infarction ([place]) 2008  . 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 Shortness of breath     Past Surgical History:  Procedure Laterality Date  . CARDIAC CATHETERIZATION    . CLIPPING OF ATRIAL APPENDAGE N/A 10/17/2019   Procedure: Clipping Of Atrial Appendage using AtriCure [id number] 45 MM AtriClip.;  Surgeon: Rexene Alberts, MD;  Location: [facility];  Service: Open Heart Surgery;  Laterality: N/A;  . CORONARY ARTERY BYPASS GRAFT N/A 10/17/2019   Procedure: CORONARY ARTERY BYPASS GRAFTING (CABG) using LIMA to LAD; Endoscopic harvest right greater saphenous vein: SVG to RAMUS.;  Surgeon: Rexene Alberts, MD;  Location: [facility];  Service: Open Heart Surgery;  Laterality: N/A;  . CORONARY BALLOON ANGIOPLASTY N/A 10/17/2019   Procedure: CORONARY BALLOON ANGIOPLASTY;  Surgeon: Nelva Bush, MD;  Location: [place];  Service: Cardiovascular;  Laterality: N/A;  . coronary stents  2009  . CORONARY/GRAFT ACUTE MI REVASCULARIZATION N/A 10/17/2019   Procedure: Coronary/Graft Acute MI Revascularization;  Surgeon: Nelva Bush, MD;  Location: [facility];  Service: Cardiovascular;  Laterality: N/A;  . ENDOVEIN HARVEST OF GREATER SAPHENOUS VEIN Right 10/17/2019   Procedure: [place] Of Greater Saphenous Vein;  Surgeon: Rexene Alberts, MD;  Location: [facility];  Service: Open Heart Surgery;  Laterality: Right;  . IABP INSERTION N/A 10/17/2019   Procedure: IABP Insertion;  Surgeon: Nelva Bush, MD;  Location: [facility];  Service: Cardiovascular;  Laterality: N/A;  . IR THORACENTESIS ASP PLEURAL SPACE W/IMG GUIDE  10/26/2019  . RIGHT/LEFT HEART CATH AND CORONARY ANGIOGRAPHY N/A 10/17/2019   Procedure: RIGHT/LEFT HEART CATH AND CORONARY ANGIOGRAPHY;  Surgeon: Nelva Bush, MD;  Location: [facility];  Service: Cardiovascular;  Laterality: N/A;  . TEE WITHOUT CARDIOVERSION  10/17/2019   Procedure: Transesophageal Echocardiogram (Tee);  Surgeon: Rexene Alberts, MD;  [facility];  Service: Open Heart Surgery;;     Current Outpatient Medications  Medication Sig Dispense Refill  . amiodarone (PACERONE) 200 MG tablet Please take 1 tab (200mg ) twice a day for 2 weeks and then 1 tab (200mg ) daily until we see you in follow-up. 60 tablet 1  . apixaban (ELIQUIS) 2.5 MG TABS tablet  Take 1 tablet (2.5 mg total) by mouth 2 (two) times daily. 60 tablet 1  . aspirin EC 81 MG tablet Take 1 tablet (81 mg total) by mouth daily.    [name] azaTHIOprine (IMURAN) 50 MG tablet Take 50 mg by mouth in the morning and at bedtime.   2  . furosemide (LASIX) 40 MG tablet Please take 1 tab (40mg ) twice a day for 5 days then, take 1 tab (40mg ) daily. Remember to take last tab around dinner time . 40 tablet 1  . hydroxychloroquine (PLAQUENIL) 200 MG tablet Take 400 mg by mouth daily.     [name]  losartan (COZAAR) 25 MG tablet Take 1 tablet (25 mg total) by mouth daily. 30 tablet 1  . oxyCODONE (OXY IR/ROXICODONE) 5 MG immediate release tablet Take 1 tablet (5 mg total) by mouth every 6 (six) hours as needed for severe pain. 30 tablet 0  . potassium chloride SA (KLOR-CON) 20 MEQ tablet Take 1 tab (20 MEQ) twice a day for 5 days with your lasix then take 1 tab [facility]) once a day. 30 tablet 1  . rosuvastatin (CRESTOR) 40 MG tablet Take 1 tablet (40 mg total) by mouth daily. 30 tablet 1  . sertraline (ZOLOFT) 50 MG tablet Take 50 mg by mouth daily.    [name] spironolactone (ALDACTONE) 25 MG tablet Take 0.5 tablets (12.5 mg total) by mouth daily. 30 tablet 1  . tamsulosin (FLOMAX) 0.4 MG CAPS capsule Take 0.4 mg by mouth daily.      No current facility-administered medications for this visit.    Allergies:   Patient has no known allergies.    Social History:  The patient  reports that he quit smoking about 26 years ago. His smoking use included cigarettes. He has a 20.00 pack-year smoking history. He has never used smokeless tobacco. He reports that he does not drink alcohol or use drugs.   Family History:  The patient's family history includes Alcohol abuse in his father; Cancer in his brother; Diabetes in his paternal uncle; Heart disease in his mother; Hyperlipidemia in his brother and sister; Hypertension in his brother and sister; Stomach cancer in his brother.    ROS:  Please see the history of present  illness.    Physical Exam: Blood pressure (!) 104/58, pulse 66, height 6' (1.829 m), weight 162 lb (73.5 kg), SpO2 98 %.  GEN:   Elderly male, thin, appears somewhat frail. HEENT: Normal NECK: No JVD; No carotid bruits LYMPHATICS: No lymphadenopathy CARDIAC: RRR.  Sternotomy appears to be healing well.  Thoracostomy sites are healing well. RESPIRATORY:  Clear to auscultation without rales, wheezing or rhonchi  ABDOMEN: Soft, non-tender, non-distended MUSCULOSKELETAL:  No edema; No deformity  SKIN: Warm and dry NEUROLOGIC:  Alert and oriented x 3   EKG:     Recent Labs: 10/17/2019: ALT 12 10/23/2019: Magnesium 2.3 10/26/2019: Hemoglobin 8.6; Platelets 278 10/27/2019: BUN 18; Creatinine, Ser 1.19; Potassium 4.1; Sodium 137    Lipid Panel    Component Value Date/Time   CHOL 113 10/17/2019 1155   CHOL 110 06/12/2018 1424   TRIG 48 10/17/2019 1155   HDL 45 10/17/2019 1155   HDL 45 06/12/2018 1424   CHOLHDL 2.5 10/17/2019 1155   VLDL 10 10/17/2019 1155   LDLCALC 58 10/17/2019 1155   LDLCALC 54 06/12/2018 1424      Wt Readings from Last 3 Encounters:  11/04/19 162 lb (73.5 kg)  10/27/19 174 lb 4.8 oz (79.1 kg)  03/13/19 165 lb (74.8 kg)      Other studies Reviewed: Additional studies/ records that were reviewed today include: . Review of the above records demonstrates:    ASSESSMENT AND PLAN:  1.  CAD :   He status post urgent coronary artery bypass grafting.  He had acute congestive heart failure.  He is currently on Lasix 40 mg a day.  He is not having any angina.  Anticipate being able to stop the Lasix when his current bottle runs out.  We will assess him in several months.  I have asked him to call me if he has any worsening shortness of  breath.  2.  Postoperative atrial fibrillation: Clinically it sounds like he is still in normal sinus rhythm.  He is on Eliquis and amiodarone.  Anticipate stopping the Eliquis and amiodarone in 3 months if he maintains normal  sinus rhythm.  We will get an EKG at that time.  3. Hyperlipidemia:    Continue current medications.  4.  Acute on chronic combined systolic and diastolic congestive heart failure: He had an ejection fraction of 25 to 30% when he presented for his heart catheterization.  He has been on Lasix and now has been revascularized.  We will reassess his echocardiogram when I see him again in several months.  Will see him in  2-3 months  Echo several days prior to office visi t    Current medicines are reviewed at length with the patient today.  The patient does not have concerns regarding medicines.       Signed, Mertie Moores, MD  11/04/2019 10:05 AM    [address], [place], [place]  [address] Phone: [phone]; Fax: [phone]

## 2019-11-04 NOTE — Telephone Encounter (Signed)
LVM and sent letter.

## 2019-11-12 ENCOUNTER — Other Ambulatory Visit: Payer: Self-pay

## 2019-11-12 ENCOUNTER — Telehealth: Payer: Self-pay

## 2019-11-12 ENCOUNTER — Encounter (HOSPITAL_COMMUNITY): Payer: Self-pay | Admitting: Emergency Medicine

## 2019-11-12 ENCOUNTER — Emergency Department (HOSPITAL_COMMUNITY): Payer: No Typology Code available for payment source

## 2019-11-12 ENCOUNTER — Emergency Department (HOSPITAL_COMMUNITY)
Admission: EM | Admit: 2019-11-12 | Discharge: 2019-11-13 | Disposition: A | Discharge status: Home / Self Care | Payer: No Typology Code available for payment source | Attending: Emergency Medicine | Admitting: Emergency Medicine

## 2019-11-12 DIAGNOSIS — Z951 Presence of aortocoronary bypass graft: Secondary | ICD-10-CM | POA: Insufficient documentation

## 2019-11-12 DIAGNOSIS — I252 Old myocardial infarction: Secondary | ICD-10-CM | POA: Insufficient documentation

## 2019-11-12 DIAGNOSIS — I2510 Atherosclerotic heart disease of native coronary artery without angina pectoris: Secondary | ICD-10-CM | POA: Diagnosis not present

## 2019-11-12 DIAGNOSIS — W19XXXA Unspecified fall, initial encounter: Secondary | ICD-10-CM | POA: Insufficient documentation

## 2019-11-12 DIAGNOSIS — I10 Essential (primary) hypertension: Secondary | ICD-10-CM | POA: Diagnosis not present

## 2019-11-12 DIAGNOSIS — Z87891 Personal history of nicotine dependence: Secondary | ICD-10-CM | POA: Diagnosis not present

## 2019-11-12 DIAGNOSIS — I4891 Unspecified atrial fibrillation: Secondary | ICD-10-CM | POA: Insufficient documentation

## 2019-11-12 DIAGNOSIS — R0602 Shortness of breath: Secondary | ICD-10-CM | POA: Diagnosis not present

## 2019-11-12 DIAGNOSIS — Y929 Unspecified place or not applicable: Secondary | ICD-10-CM | POA: Insufficient documentation

## 2019-11-12 DIAGNOSIS — Y939 Activity, unspecified: Secondary | ICD-10-CM | POA: Diagnosis not present

## 2019-11-12 DIAGNOSIS — R42 Dizziness and giddiness: Secondary | ICD-10-CM | POA: Diagnosis present

## 2019-11-12 DIAGNOSIS — R55 Syncope and collapse: Secondary | ICD-10-CM | POA: Diagnosis not present

## 2019-11-12 DIAGNOSIS — Y999 Unspecified external cause status: Secondary | ICD-10-CM | POA: Diagnosis not present

## 2019-11-12 LAB — COMPREHENSIVE METABOLIC PANEL
ALT: 20 U/L (ref 0–44)
AST: 22 U/L (ref 15–41)
Albumin: 3.2 g/dL — ABNORMAL LOW (ref 3.5–5.0)
Alkaline Phosphatase: 66 U/L (ref 38–126)
Anion gap: 13 (ref 5–15)
BUN: 25 mg/dL — ABNORMAL HIGH (ref 8–23)
CO2: 22 mmol/L (ref 22–32)
Calcium: 9 mg/dL (ref 8.9–10.3)
Chloride: 100 mmol/L (ref 98–111)
Creatinine, Ser: 1.56 mg/dL — ABNORMAL HIGH (ref 0.61–1.24)
GFR calc Af Amer: 50 mL/min — ABNORMAL LOW (ref >60)
GFR calc non Af Amer: 43 mL/min — ABNORMAL LOW (ref >60)
Glucose, Bld: 134 mg/dL — ABNORMAL HIGH (ref 70–99)
Potassium: 4.2 mmol/L (ref 3.5–5.1)
Sodium: 135 mmol/L (ref 135–145)
Total Bilirubin: 0.7 mg/dL (ref 0.3–1.2)
Total Protein: 7.1 g/dL (ref 6.5–8.1)

## 2019-11-12 LAB — CBC
HCT: 39.2 % (ref 39.0–52.0)
Hemoglobin: 12.2 g/dL — ABNORMAL LOW (ref 13.0–17.0)
MCH: 31.4 pg (ref 26.0–34.0)
MCHC: 31.1 g/dL (ref 30.0–36.0)
MCV: 101 fL — ABNORMAL HIGH (ref 80.0–100.0)
Platelets: 300 10*3/uL (ref 150–400)
RBC: 3.88 MIL/uL — ABNORMAL LOW (ref 4.22–5.81)
RDW: 13.8 % (ref 11.5–15.5)
WBC: 6.3 10*3/uL (ref 4.0–10.5)
nRBC: 0 % (ref 0.0–0.2)

## 2019-11-12 LAB — I-STAT CHEM 8, ED
BUN: 26 mg/dL — ABNORMAL HIGH (ref 8–23)
Calcium, Ion: 1.09 mmol/L — ABNORMAL LOW (ref 1.15–1.40)
Chloride: 102 mmol/L (ref 98–111)
Creatinine, Ser: 1.5 mg/dL — ABNORMAL HIGH (ref 0.61–1.24)
Glucose, Bld: 132 mg/dL — ABNORMAL HIGH (ref 70–99)
HCT: 38 % — ABNORMAL LOW (ref 39.0–52.0)
Hemoglobin: 12.9 g/dL — ABNORMAL LOW (ref 13.0–17.0)
Potassium: 4.2 mmol/L (ref 3.5–5.1)
Sodium: 135 mmol/L (ref 135–145)
TCO2: 23 mmol/L (ref 22–32)

## 2019-11-12 LAB — DIFFERENTIAL
Abs Immature Granulocytes: 0.03 10*3/uL (ref 0.00–0.07)
Basophils Absolute: 0 10*3/uL (ref 0.0–0.1)
Basophils Relative: 1 %
Eosinophils Absolute: 0.4 10*3/uL (ref 0.0–0.5)
Eosinophils Relative: 6 %
Immature Granulocytes: 1 %
Lymphocytes Relative: 22 %
Lymphs Abs: 1.4 10*3/uL (ref 0.7–4.0)
Monocytes Absolute: 0.8 10*3/uL (ref 0.1–1.0)
Monocytes Relative: 12 %
Neutro Abs: 3.7 10*3/uL (ref 1.7–7.7)
Neutrophils Relative %: 58 %

## 2019-11-12 LAB — PROTIME-INR
INR: 1.3 — ABNORMAL HIGH (ref 0.8–1.2)
Prothrombin Time: 15.9 seconds — ABNORMAL HIGH (ref 11.4–15.2)

## 2019-11-12 LAB — CBG MONITORING, ED: Glucose-Capillary: 87 mg/dL (ref 70–99)

## 2019-11-12 LAB — TROPONIN I (HIGH SENSITIVITY): Troponin I (High Sensitivity): 41 ng/L — ABNORMAL HIGH (ref <18)

## 2019-11-12 LAB — APTT: aPTT: 28 seconds (ref 24–36)

## 2019-11-12 MED ORDER — SODIUM CHLORIDE 0.9% FLUSH: 3.0000 mL | Freq: Once | INTRAVENOUS | Status: DC

## 2019-11-12 MED ORDER — SODIUM CHLORIDE 0.9 % IV BOLUS
500.0000 mL | Freq: Once | INTRAVENOUS | Status: AC
  Administered 2019-11-12: 500 mL via INTRAVENOUS

## 2019-11-12 MED ORDER — ONDANSETRON HCL 4 MG/2ML IJ SOLN
4.0000 mg | Freq: Once | INTRAMUSCULAR | Status: AC
  Administered 2019-11-12: 4 mg via INTRAVENOUS
  Filled 2019-11-12: qty 2

## 2019-11-12 NOTE — ED Provider Notes (Signed)
[facility] Emergency Department Provider Note MRN:  [id number]  Arrival date & time: 11/12/19     Chief Complaint   Fall   History of Present Illness   Cody Arellano is a 74 y.o. year-old male with a history of CAD, lupus presenting to the ED with chief complaint of fall.  Patient explains that he had a cardiac bypass 2 weeks ago, has been having poor appetite during his recovery.  Has been feeling lightheaded for the past 2 days, had two falls today, denies head trauma, no injuries from the fall.  Feels generally unwell, weak, some shortness of breath, denies chest pain.  Symptoms are constant, mild to moderate, no exacerbating or alleviating factors.  Review of Systems  A complete 10 system review of systems was obtained and all systems are negative except as noted in the HPI and PMH.   Patient's Health History    Past Medical History:  Diagnosis Date  . Anxiety   . Chronic tension headache   . Colon polyps   . Coronary artery disease 2008/2009   MI with PCI x 2, then PCI x 1 in 2009  . Depression   . Dyslipidemia   . Dysrhythmia    atrial fibrillation  . Hyperlipidemia   . Hypertension   . Lupus [facility])    sees Dr Trudie Reed  . Lupus disease of the lung   . 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 Myocardial infarction ([place]) 2008  . 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 Shortness of breath     Past Surgical History:  Procedure Laterality Date  . CARDIAC CATHETERIZATION    . CLIPPING OF ATRIAL APPENDAGE N/A 10/17/2019   Procedure: Clipping Of Atrial Appendage using AtriCure [id number] 45 MM AtriClip.;  Surgeon: Rexene Alberts, MD;  Location: [place];  Service: Open Heart Surgery;  Laterality: N/A;  . CORONARY ARTERY BYPASS GRAFT N/A 10/17/2019   Procedure: CORONARY ARTERY BYPASS GRAFTING (CABG) using LIMA to LAD; Endoscopic harvest right greater saphenous vein: SVG to RAMUS.;  Surgeon: Rexene Alberts, MD;   Location: [place];  Service: Open Heart Surgery;  Laterality: N/A;  . CORONARY BALLOON ANGIOPLASTY N/A 10/17/2019   Procedure: CORONARY BALLOON ANGIOPLASTY;  Surgeon: Nelva Bush, MD;  Location: [place] CV LAB;  Service: Cardiovascular;  Laterality: N/A;  . coronary stents  2009  . CORONARY/GRAFT ACUTE MI REVASCULARIZATION N/A 10/17/2019   Procedure: Coronary/Graft Acute MI Revascularization;  Surgeon: Nelva Bush, MD;  Location: [place] CV LAB;  Service: Cardiovascular;  Laterality: N/A;  . ENDOVEIN HARVEST OF GREATER SAPHENOUS VEIN Right 10/17/2019   Procedure: [place] Of Greater Saphenous Vein;  Surgeon: Rexene Alberts, MD;  Location: [place];  Service: Open Heart Surgery;  Laterality: Right;  . IABP INSERTION N/A 10/17/2019   Procedure: IABP Insertion;  Surgeon: Nelva Bush, MD;  Location: [place] CV LAB;  Service: Cardiovascular;  Laterality: N/A;  . IR THORACENTESIS ASP PLEURAL SPACE W/IMG GUIDE  10/26/2019  . RIGHT/LEFT HEART CATH AND CORONARY ANGIOGRAPHY N/A 10/17/2019   Procedure: RIGHT/LEFT HEART CATH AND CORONARY ANGIOGRAPHY;  Surgeon: Nelva Bush, MD;  Location: [place] CV LAB;  Service: Cardiovascular;  Laterality: N/A;  . TEE WITHOUT CARDIOVERSION  10/17/2019   Procedure: Transesophageal Echocardiogram (Tee);  Surgeon: Rexene Alberts, MD;  Location: [facility] OR;  Service: Open Heart Surgery;;    Family History  Problem Relation Age  of Onset  . Heart disease Mother   . Alcohol abuse Father   . Hyperlipidemia Sister   . Hypertension Sister   . Hyperlipidemia Brother   . Hypertension Brother   . Cancer Brother        ?lung cancer  . Stomach cancer Brother   . Diabetes Paternal Uncle   . Colon cancer Neg Hx   . Esophageal cancer Neg Hx   . Rectal cancer Neg Hx     Social History   Socioeconomic History  . Marital status: Married    Spouse name: Not on file  . Number of children: 5  . Years of education: Not on file  . Highest education  level: Not on file  Occupational History  . Occupation: Engineer, production for [employer]    Comment: retired  Tobacco Use  . Smoking status: Former Smoker    Packs/day: 1.00    Years: 20.00    Pack years: 20.00    Types: Cigarettes    Quit date: 02/26/1993    Years since quitting: 26.7  . Smokeless tobacco: Never Used  . Tobacco comment: QUIT SMOKING 20 YEARS AGO  Vaping Use  . Vaping Use: Never used  Substance and Sexual Activity  . Alcohol use: No  . Drug use: No  . Sexual activity: Not on file  Other Topics Concern  . Not on file  Social History Narrative   No living will   Wife should make health care decisions--alternate is sons   Would accept resuscitation   Not sure about tube feeds   Social Determinants of Health   Financial Resource Strain:   . Difficulty of Paying Living Expenses:   Food Insecurity:   . Worried About Charity fundraiser in the Last Year:   . Arboriculturist in the Last Year:   Transportation Needs:   . Film/video editor (Medical):   [name] Lack of Transportation (Non-Medical):   Physical Activity:   . Days of Exercise per Week:   . Minutes of Exercise per Session:   Stress:   . Feeling of Stress :   Social Connections:   . Frequency of Communication with Friends and Family:   . Frequency of Social Gatherings with Friends and Family:   . Attends Religious Services:   . Active Member of Clubs or Organizations:   . Attends Archivist Meetings:   [name] Marital Status:   Intimate Partner Violence:   . Fear of Current or Ex-Partner:   . Emotionally Abused:   [name] Physically Abused:   . Sexually Abused:      Physical Exam   Vitals:   11/12/19 2200 11/12/19 2230  BP: 114/66 108/68  Pulse: 61 (!) 59  Resp: 19 19  Temp:    SpO2: 100% 100%    CONSTITUTIONAL: Well-appearing, NAD NEURO:  Alert and oriented x 3, no focal deficits EYES:  eyes equal and reactive ENT/NECK:  no LAD, no JVD CARDIO: Regular rate,  well-perfused, normal S1 and S2; well-healing sternotomy site PULM:  CTAB no wheezing or rhonchi GI/GU:  normal bowel sounds, non-distended, non-tender MSK/SPINE:  No gross deformities, no edema SKIN:  no rash, atraumatic PSYCH:  Appropriate speech and behavior  *Additional and/or pertinent findings included in MDM below  Diagnostic and Interventional Summary    EKG Interpretation  Date/Time:  Thursday November 12 2019 21:33:49 EDT Ventricular Rate:  63 PR Interval:  166 QRS Duration: 110 QT Interval:  512 QTC  Calculation: 525 R Axis:   47 Text Interpretation: Sinus rhythm Probable anteroseptal infarct, recent ST elevation, consider inferior injury Prolonged QT interval Confirmed by Gerlene Fee [phone]) on 11/12/2019 10:01:28 PM      Labs Reviewed  PROTIME-INR - Abnormal; Notable for the following components:      Result Value   Prothrombin Time 15.9 (*)    INR 1.3 (*)    All other components within normal limits  CBC - Abnormal; Notable for the following components:   RBC 3.88 (*)    Hemoglobin 12.2 (*)    MCV 101.0 (*)    All other components within normal limits  COMPREHENSIVE METABOLIC PANEL - Abnormal; Notable for the following components:   Glucose, Bld 134 (*)    BUN 25 (*)    Creatinine, Ser 1.56 (*)    Albumin 3.2 (*)    GFR calc non Af Amer 43 (*)    GFR calc Af Amer 50 (*)    All other components within normal limits  I-STAT CHEM 8, ED - Abnormal; Notable for the following components:   BUN 26 (*)    Creatinine, Ser 1.50 (*)    Glucose, Bld 132 (*)    Calcium, Ion 1.09 (*)    Hemoglobin 12.9 (*)    HCT 38.0 (*)    All other components within normal limits  TROPONIN I (HIGH SENSITIVITY) - Abnormal; Notable for the following components:   Troponin I (High Sensitivity) 41 (*)    All other components within normal limits  APTT  DIFFERENTIAL  BRAIN NATRIURETIC PEPTIDE  CBG MONITORING, ED    CT HEAD WO CONTRAST  Final Result    CT ANGIO CHEST PE W OR WO  CONTRAST    (Results Pending)    Medications  sodium chloride flush (NS) 0.9 % injection 3 mL (3 mLs Intravenous Not Given 11/12/19 2141)  sodium chloride 0.9 % bolus 500 mL (500 mLs Intravenous New Bag/Given 11/12/19 2228)  ondansetron (ZOFRAN) injection 4 mg (4 mg Intravenous Given 11/12/19 2228)     Procedures  /  Critical Care Procedures  ED Course and Medical Decision Making  I have reviewed the triage vital signs, the nursing notes, and pertinent available records from the EMR.  Listed above are laboratory and imaging tests that I personally ordered, reviewed, and interpreted and then considered in my medical decision making (see below for details).      Lightheadedness 2 weeks after CABG.  Could be simple dehydration, patient has been having poor appetite.  However given the recent surgery he is also at risk for postoperative pulmonary embolism.  Will obtain CT A imaging.  Signed out to oncoming provider at shift change.  If troponin or BNP were elevated, would consider touching base with cardiology or cardiothoracic surgery given his recent extensive intervention.  If completely negative work-up and patient is able to ambulate, I do not think there would be an indication for admission.    Barth Kirks. Sedonia Small, MD [place] [email or web address]  Final Clinical Impressions(s) / ED Diagnoses     ICD-10-CM   1. Fall, initial encounter  W19.XXXA   2. Near syncope  R55   3. Shortness of breath  R06.02     ED Discharge Orders    None       Discharge Instructions Discussed with and Provided to Patient:   Discharge Instructions   None       Iyanna Drummer, Barth Kirks, MD  11/12/19 2318  

## 2019-11-12 NOTE — ED Notes (Signed)
CBG Results of 87 reported to [facility], Therapist, sports.

## 2019-11-12 NOTE — ED Notes (Signed)
Triage RN made aware of pts BP.

## 2019-11-12 NOTE — ED Triage Notes (Signed)
Pt c/o frequent falls and feeling disoriented since having bypass surgery x 2 weeks ago. Pt answering all questions appropriately and following commands at this time.

## 2019-11-12 NOTE — Telephone Encounter (Addendum)
Pt calls office at 1600 with c/o severe dyspnea and report of passing out (w/ fall) x 2 last night when he got up to use the bathroom (no obvious injury). He is s/p CABG by Dr. Roxy Manns on 10/17/19. Informed that he needs to go to the ED immediately. Wife is present and aware, and they verbalize understanding. Pt is scheduled for f/u with Dr. Roxy Manns on 11/16/19.

## 2019-11-13 ENCOUNTER — Other Ambulatory Visit: Payer: Self-pay | Admitting: Thoracic Surgery (Cardiothoracic Vascular Surgery)

## 2019-11-13 DIAGNOSIS — Z951 Presence of aortocoronary bypass graft: Secondary | ICD-10-CM

## 2019-11-13 LAB — TROPONIN I (HIGH SENSITIVITY): Troponin I (High Sensitivity): 44 ng/L — ABNORMAL HIGH (ref <18)

## 2019-11-13 MED ORDER — IOHEXOL 350 MG/ML SOLN
80.0000 mL | Freq: Once | INTRAVENOUS | Status: AC | PRN
  Administered 2019-11-13: 80 mL via INTRAVENOUS

## 2019-11-13 NOTE — ED Notes (Signed)
Pt ambulated with a steady gait. Denies dizziness and when asked how he felt pt stated "Just fine".  Callie, RN has been notified.

## 2019-11-16 ENCOUNTER — Ambulatory Visit
Admission: RE | Admit: 2019-11-16 | Discharge: 2019-11-16 | Disposition: A | Discharge status: Home / Self Care | Payer: No Typology Code available for payment source | Source: Ambulatory Visit | Attending: Thoracic Surgery (Cardiothoracic Vascular Surgery) | Admitting: Thoracic Surgery (Cardiothoracic Vascular Surgery)

## 2019-11-16 ENCOUNTER — Encounter: Payer: Self-pay | Admitting: Physician Assistant

## 2019-11-16 ENCOUNTER — Telehealth: Payer: Self-pay

## 2019-11-16 ENCOUNTER — Ambulatory Visit (INDEPENDENT_AMBULATORY_CARE_PROVIDER_SITE_OTHER): Payer: Self-pay | Admitting: Physician Assistant

## 2019-11-16 ENCOUNTER — Other Ambulatory Visit: Payer: Self-pay

## 2019-11-16 VITALS — BP 92/60 | HR 67 | Temp 97.9°F | Resp 22 | Ht 72.0 in | Wt 162.0 lb

## 2019-11-16 DIAGNOSIS — Z951 Presence of aortocoronary bypass graft: Secondary | ICD-10-CM

## 2019-11-16 DIAGNOSIS — I2510 Atherosclerotic heart disease of native coronary artery without angina pectoris: Secondary | ICD-10-CM

## 2019-11-16 MED ORDER — LOSARTAN POTASSIUM 25 MG PO TABS: 12.5000 mg | ORAL_TABLET | Freq: Every day | ORAL | 1 refills | Status: DC

## 2019-11-16 NOTE — Progress Notes (Signed)
[address]       [place] [address]             [phone]       Cody Arellano is a 74 y.o. male patient status post coronary bypass grafting x2 with Dr. Roxy Manns.  His postop course was complicated atrial fibrillation to in which she was treated with IV amiodarone.  He did convert to normal sinus rhythm however he later had recurrence of paroxysmal atrial fibrillation/flutter.  He also required a TEE since due to significant left pleural effusion which was done on 10/26/2019 yielding 1 L of thin serous fluid. Today he endorses occasional dizziness/lightheadedness and fatigue.   1. Coronary artery disease involving native heart without angina pectoris, unspecified vessel or lesion type   2. S/P CABG (coronary artery bypass graft)    Past Medical History:  Diagnosis Date  . Anxiety   . Chronic tension headache   . Colon polyps   . Coronary artery disease 2008/2009   MI with PCI x 2, then PCI x 1 in 2009  . Depression   . Dyslipidemia   . Dysrhythmia    atrial fibrillation  . Hyperlipidemia   . Hypertension   . Lupus [facility])    sees Dr Trudie Reed  . Lupus disease of the lung   . 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 Myocardial infarction ([place]) 2008  . 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 Shortness of breath    No past surgical history pertinent negatives on file. Scheduled Meds: Current Outpatient Medications on File Prior to Visit  Medication Sig Dispense Refill  . amiodarone (PACERONE) 200 MG tablet Please take 1 tab (200mg ) twice a day for 2 weeks and then 1 tab (200mg ) daily until we see you in follow-up. (Patient taking differently: Take 200 mg by mouth every evening. ) 60 tablet 1  . apixaban (ELIQUIS) 2.5 MG TABS tablet Take 1 tablet (2.5 mg total) by mouth 2 (two) times daily. 60 tablet 1  . aspirin EC 81 MG tablet Take 1 tablet (81 mg total) by mouth daily.    [name] azaTHIOprine (IMURAN) 50 MG tablet Take  50 mg by mouth in the morning and at bedtime.   2  . furosemide (LASIX) 40 MG tablet Please take 1 tab (40mg ) twice a day for 5 days then, take 1 tab (40mg ) daily. Remember to take last tab around dinner time . (Patient taking differently: Take 40 mg by mouth daily. ) 40 tablet 1  . hydroxychloroquine (PLAQUENIL) 200 MG tablet Take 400 mg by mouth every evening.     [name] losartan (COZAAR) 25 MG tablet Take 1 tablet (25 mg total) by mouth daily. 30 tablet 1  . oxyCODONE (OXY IR/ROXICODONE) 5 MG immediate release tablet Take 1 tablet (5 mg total) by mouth every 6 (six) hours as needed for severe pain. 30 tablet 0  . potassium chloride SA (KLOR-CON) 20 MEQ tablet Take 1 tab (20 MEQ) twice a day for 5 days with your lasix then take 1 tab [facility]) once a day. (Patient taking differently: 20 mEq daily. Take 1 tab (20 MEQ) twice a day for 5 days with your lasix then take 1 tab [facility]) once a day) 30 tablet 1  . rosuvastatin (CRESTOR) 40 MG tablet Take 1 tablet (40 mg total) by mouth daily. 30 tablet 1  . spironolactone (ALDACTONE) 25  MG tablet Take 0.5 tablets (12.5 mg total) by mouth daily. 30 tablet 1  . tamsulosin (FLOMAX) 0.4 MG CAPS capsule Take 0.4 mg by mouth every evening.     . sertraline (ZOLOFT) 50 MG tablet Take 50 mg by mouth daily. (Patient not taking: Reported on 11/16/2019)     No current facility-administered medications on file prior to visit.    No Known Allergies Active Problems:   * No active hospital problems. *  Blood pressure 92/60, pulse 67, temperature 97.9 F (36.6 C), resp. rate (!) 22, height 6' (1.829 m), weight 162 lb (73.5 kg), SpO2 100 %.  Subjective  Cody Arellano is s/p CABG x 2 with Dr. Roxy Manns and is here for his routine post-op appointment. Overall he is doing well besides some dizziness and fatigue.   Objective   Cor:RRR, no murmur Pulm: CTA bilaterally and in all fields Abd: no tenderness Wound: c/d/i no drainage Ext: no edema  CLINICAL DATA:  Status post CABG on  10/17/2019.  EXAM: CHEST - 2 VIEW  COMPARISON:  Chest radiographs 10/27/2019 and chest CTA 11/13/2019  FINDINGS: Sequelae of CABG and left atrial appendage clipping are again identified. The cardiac silhouette is normal in size. The lungs are hyperinflated with chronic interstitial coarsening and lower lobe predominant peripheral cysts as shown on recent CT. No acute airspace consolidation, overt pulmonary edema, pleural effusion, pneumothorax is identified. There is moderately severe thoracic dextroscoliosis. A chronic nonunited distal left clavicle fracture is noted.  IMPRESSION: Chronic lung disease without evidence of active cardiopulmonary disease.   Electronically Signed   By: Logan Bores M.D.   On: 11/16/2019 13:37  Assessment & Plan   Cody Arellano is a 74 year old gentleman who returns today for his routine postop visit status post coronary artery bypass grafting x2.  Today, the patient and his family member have multiple questions about his medications that he is taking.  He has had some dizziness and lightheadedness occasionally which makes sense with his hypotension documented today.  He has been on Lasix 40 mg with his potassium pills since his hospitalization in addition to spironolactone.  Today, he does not have any pleural effusions on his chest x-ray and he does not have any edema in his lower extremity therefore he discontinued Lasix and potassium supplementation.  I did continue spironolactone due to his low ejection fraction 25%.  He is due to get a follow-up echocardiogram in 2 months through his cardiology office.  I also cut his Cozaar in half so he will not be taking 12.5 mg daily.  I also encouraged fluid and oral intake since the patient has not been eating or drinking much over the last few weeks.  He has continued to lose weight which is likely due to lack of caloric intake on a daily basis.  I encouraged use of supplements such as Ensure 3 times daily for  both protein needs and caloric needs.  It does not sound like he is even making the daily maintenance 1200 cal/day.  He also is extremely fatigued and does not have energy to do much.  He does endorse walking 3 times a day for about 5 to 7 minutes but has not been able to increase his activity level due to fatigue.  I think his lack of energy is due to   lack of nutrition and dehydration.  I encouraged patient to drink 6 to 8 glasses of water a day.  He is interested in participating in a pulmonary rehab  program after the 6-week mark and hopefully he will feel well enough to do so.  I think with the medication changes that I have performed today in addition to some dietary and fluid intake changes that he should be feeling better in the next few days.  Already scheduled appointment for his echocardiogram in Aug and he will need a follow-up with Dr. Roxy Manns at the 3 month mark.   Medication changes: Stop lasix and potassium Change Cozaar to 12.5mg  daily Continue ensure TID for supplementation-encouraged protein intake Encouraged 8 full glasses of water a day Education on a low-sodium diet <2g daily and appropriate foods to eat Daily miralax for stool softener  Cody Rough, PA-C  Elgie Collard 11/16/2019

## 2019-11-16 NOTE — Patient Instructions (Signed)
Make every effort to stay physically active, get some type of exercise on a regular basis, and stick to a "heart healthy diet".  The long term benefits for regular exercise and a healthy diet are critically important to your overall health and wellbeing.  You are encouraged to enroll and participate in the outpatient cardiac rehab program beginning as soon as practical.  Follow-up in 2 months with Dr. Roxy Manns

## 2019-11-16 NOTE — Telephone Encounter (Signed)
Left message to see how he was doing after recent ER visit for a fall.

## 2019-12-11 ENCOUNTER — Other Ambulatory Visit: Payer: Self-pay

## 2019-12-11 ENCOUNTER — Encounter: Payer: No Typology Code available for payment source | Attending: Internal Medicine | Admitting: *Deleted

## 2019-12-11 DIAGNOSIS — Z951 Presence of aortocoronary bypass graft: Secondary | ICD-10-CM | POA: Insufficient documentation

## 2019-12-11 NOTE — Progress Notes (Signed)
Initial orientation completed. Diagnosis can be found in [facility] 5/15. EP orientation scheduled for Wednesday, 7/14 at 11 am.

## 2019-12-16 ENCOUNTER — Other Ambulatory Visit: Payer: Self-pay

## 2019-12-16 VITALS — Ht 71.0 in | Wt 154.1 lb

## 2019-12-16 DIAGNOSIS — Z951 Presence of aortocoronary bypass graft: Secondary | ICD-10-CM

## 2019-12-16 NOTE — Patient Instructions (Addendum)
Patient Instructions  Patient Details  Name: Cody Arellano MRN: [id number] Date of Birth: Oct 08, 1945 Referring Provider:  Charlsie Merles, MD  Below are your personal goals for exercise, nutrition, and risk factors. Our goal is to help you stay on track towards obtaining and maintaining these goals. We will be discussing your progress on these goals with you throughout the program.  Initial Exercise Prescription:  Initial Exercise Prescription - 12/17/19 0700      Date of Initial Exercise RX and Referring Provider   Date 12/16/19    Referring Provider Herold Harms, [place] ([place])      Treadmill   MPH 1.5    Grade 0    Minutes 15    METs 2.15      Recumbant Bike   Level 1    RPM 60    Watts 10    Minutes 15    METs 2.3      NuStep   Level 1    SPM 80    Minutes 15    METs 2.3      REL-XR   Level 1    Speed 50    Minutes 15    METs 2.3      T5 Nustep   Level 1    SPM 80    Minutes 15    METs 2.3      Prescription Details   Frequency (times per week) 3      Intensity   THRR 40-80% of Max Heartrate 94-128    Ratings of Perceived Exertion 11-13    Perceived Dyspnea 0-4      Progression   Progression Continue to progress workloads to maintain intensity without signs/symptoms of physical distress.      Resistance Training   Training Prescription Yes    Weight 3 lb    Reps 10-15           Exercise Goals: Frequency: Be able to perform aerobic exercise two to three times per week in program working toward 2-5 days per week of home exercise.  Intensity: Work with a perceived exertion of 11 (fairly light) - 15 (hard) while following your exercise prescription.  We will make changes to your prescription with you as you progress through the program.   Duration: Be able to do 30 to 45 minutes of continuous aerobic exercise in addition to a 5 minute warm-up and a 5 minute cool-down routine.   Nutrition Goals: Your personal nutrition goals will be established  when you do your nutrition analysis with the dietician.  The following are general nutrition guidelines to follow: Cholesterol < 200mg /day Sodium < 1500mg /day Fiber: Men over 50 yrs - 30 grams per day  Personal Goals:  Personal Goals and Risk Factors at Admission - 12/16/19 1322      Core Components/Risk Factors/Patient Goals on Admission    Weight Management Weight Maintenance;Yes    Intervention Weight Management: Develop a combined nutrition and exercise program designed to reach desired caloric intake, while maintaining appropriate intake of nutrient and fiber, sodium and fats, and appropriate energy expenditure required for the weight goal.;Weight Management: Provide education and appropriate resources to help participant work on and attain dietary goals.;Weight Management/Obesity: Establish reasonable short term and long term weight goals.    Admit Weight 154 lb 1.6 oz (69.9 kg)    Goal Weight: Short Term 154 lb 1.6 oz (69.9 kg)    Goal Weight: Long Term 154 lb 1.6 oz (69.9 kg)  Expected Outcomes Short Term: Continue to assess and modify interventions until short term weight is achieved;Long Term: Adherence to nutrition and physical activity/exercise program aimed toward attainment of established weight goal;Weight Maintenance: Understanding of the daily nutrition guidelines, which includes 25-35% calories from fat, 7% or less cal from saturated fats, less than 200mg  cholesterol, less than 1.5gm of sodium, & 5 or more servings of fruits and vegetables daily;Understanding recommendations for meals to include 15-35% energy as protein, 25-35% energy from fat, 35-60% energy from carbohydrates, less than 200mg  of dietary cholesterol, 20-35 gm of total fiber daily;Understanding of distribution of calorie intake throughout the day with the consumption of 4-5 meals/snacks    Hypertension Yes    Intervention Provide education on lifestyle modifcations including regular physical activity/exercise,  weight management, moderate sodium restriction and increased consumption of fresh fruit, vegetables, and low fat dairy, alcohol moderation, and smoking cessation.;Monitor prescription use compliance.    Expected Outcomes Short Term: Continued assessment and intervention until BP is < 140/30mm HG in hypertensive participants. < 130/39mm HG in hypertensive participants with diabetes, heart failure or chronic kidney disease.;Long Term: Maintenance of blood pressure at goal levels.    Lipids Yes    Intervention Provide education and support for participant on nutrition & aerobic/resistive exercise along with prescribed medications to achieve LDL 70mg , HDL >40mg .    Expected Outcomes Short Term: Participant states understanding of desired cholesterol values and is compliant with medications prescribed. Participant is following exercise prescription and nutrition guidelines.;Long Term: Cholesterol controlled with medications as prescribed, with individualized exercise RX and with personalized nutrition plan. Value goals: LDL < 70mg , HDL > 40 mg.           Tobacco Use Initial Evaluation: Social History   Tobacco Use  Smoking Status Former Smoker  . Packs/day: 1.00  . Years: 20.00  . Pack years: 20.00  . Types: Cigarettes  . Quit date: 02/26/1993  . Years since quitting: 26.8  Smokeless Tobacco Never Used  Tobacco Comment   QUIT SMOKING 20 YEARS AGO    Exercise Goals and Review:  Exercise Goals    Row Name 12/16/19 1321             Exercise Goals   Increase Physical Activity Yes       Intervention Provide advice, education, support and counseling about physical activity/exercise needs.;Develop an individualized exercise prescription for aerobic and resistive training based on initial evaluation findings, risk stratification, comorbidities and participant's personal goals.       Expected Outcomes Short Term: Attend rehab on a regular basis to increase amount of physical activity.;Long  Term: Add in home exercise to make exercise part of routine and to increase amount of physical activity.;Long Term: Exercising regularly at least 3-5 days a week.       Increase Strength and Stamina Yes       Intervention Provide advice, education, support and counseling about physical activity/exercise needs.;Develop an individualized exercise prescription for aerobic and resistive training based on initial evaluation findings, risk stratification, comorbidities and participant's personal goals.       Expected Outcomes Short Term: Increase workloads from initial exercise prescription for resistance, speed, and METs.;Long Term: Improve cardiorespiratory fitness, muscular endurance and strength as measured by increased METs and functional capacity (6MWT);Short Term: Perform resistance training exercises routinely during rehab and add in resistance training at home       Able to understand and use rate of perceived exertion (RPE) scale Yes  Intervention Provide education and explanation on how to use RPE scale       Expected Outcomes Short Term: Able to use RPE daily in rehab to express subjective intensity level;Long Term:  Able to use RPE to guide intensity level when exercising independently       Able to understand and use Dyspnea scale Yes       Intervention Provide education and explanation on how to use Dyspnea scale       Expected Outcomes Short Term: Able to use Dyspnea scale daily in rehab to express subjective sense of shortness of breath during exertion;Long Term: Able to use Dyspnea scale to guide intensity level when exercising independently       Knowledge and understanding of Target Heart Rate Range (THRR) Yes       Intervention Provide education and explanation of THRR including how the numbers were predicted and where they are located for reference       Expected Outcomes Short Term: Able to state/look up THRR;Short Term: Able to use daily as guideline for intensity in rehab;Long  Term: Able to use THRR to govern intensity when exercising independently       Able to check pulse independently Yes       Intervention Provide education and demonstration on how to check pulse in carotid and radial arteries.;Review the importance of being able to check your own pulse for safety during independent exercise       Expected Outcomes Short Term: Able to explain why pulse checking is important during independent exercise;Long Term: Able to check pulse independently and accurately       Understanding of Exercise Prescription Yes       Intervention Provide education, explanation, and written materials on patient's individual exercise prescription       Expected Outcomes Short Term: Able to explain program exercise prescription;Long Term: Able to explain home exercise prescription to exercise independently              Copy of goals given to participant.

## 2019-12-16 NOTE — Progress Notes (Signed)
Cardiac Individual Treatment Plan  Patient Details  Name: Cody Arellano MRN: [id number] Date of Birth: [birth year] Referring Provider:     Cardiac Rehab from 12/16/2019 in [facility] Cardiac and Pulmonary Rehab  Referring Provider Herold Harms, MD ([place])      Initial Encounter Date:    Cardiac Rehab from 12/16/2019 in [facility] Cardiac and Pulmonary Rehab  Date 12/16/19      Visit Diagnosis: S/P CABG x 2  Patient's Home Medications on Admission:  Current Outpatient Medications:    amiodarone (PACERONE) 200 MG tablet, Please take 1 tab ('200mg'$ ) twice a day for 2 weeks and then 1 tab ('200mg'$ ) daily until we see you in follow-up. (Patient taking differently: Take 200 mg by mouth every evening. ), Disp: 60 tablet, Rfl: 1   apixaban (ELIQUIS) 2.5 MG TABS tablet, Take 1 tablet (2.5 mg total) by mouth 2 (two) times daily., Disp: 60 tablet, Rfl: 1   aspirin EC 81 MG tablet, Take 1 tablet (81 mg total) by mouth daily., Disp: , Rfl:    azaTHIOprine (IMURAN) 50 MG tablet, Take 50 mg by mouth in the morning and at bedtime. , Disp: , Rfl: 2   hydroxychloroquine (PLAQUENIL) 200 MG tablet, Take 400 mg by mouth every evening. , Disp: , Rfl:    losartan (COZAAR) 25 MG tablet, Take 0.5 tablets (12.5 mg total) by mouth daily., Disp: 30 tablet, Rfl: 1   oxyCODONE (OXY IR/ROXICODONE) 5 MG immediate release tablet, Take 1 tablet (5 mg total) by mouth every 6 (six) hours as needed for severe pain., Disp: 30 tablet, Rfl: 0   rosuvastatin (CRESTOR) 40 MG tablet, Take 1 tablet (40 mg total) by mouth daily., Disp: 30 tablet, Rfl: 1   sertraline (ZOLOFT) 50 MG tablet, Take 50 mg by mouth daily. (Patient not taking: Reported on 11/16/2019), Disp: , Rfl:    spironolactone (ALDACTONE) 25 MG tablet, Take 0.5 tablets (12.5 mg total) by mouth daily., Disp: 30 tablet, Rfl: 1   tamsulosin (FLOMAX) 0.4 MG CAPS capsule, Take 0.4 mg by mouth every evening. 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   LIMA to LAD, SVG to ramus intermediate, EVH via right thigh   Shortness of breath     Tobacco Use: Social History   Tobacco Use  Smoking Status Former Smoker   Packs/day: 1.00   Years: 20.00   Pack years: 20.00   Types: Cigarettes   Quit date: 02/26/1993   Years since quitting: 26.8  Smokeless Tobacco Never Used  Tobacco Comment   QUIT SMOKING 20 YEARS AGO    Labs: Recent Review Flowsheet Data    Labs for ITP Cardiac and Pulmonary Rehab Latest Ref Rng & Units 10/24/2019 10/25/2019 10/25/2019 10/26/2019 11/12/2019   Cholestrol 0 - 200 mg/dL - - - - -   LDLCALC 0 - 99 mg/dL - - - - -   HDL >40  mg/dL - - - - -   Trlycerides <150 mg/dL - - - - -   Hemoglobin A1c 4.8 - 5.6 % - - - - -   PHART 7.35 - 7.45 - - - - -   PCO2ART 32 - 48 mmHg - - - - -   HCO3 20.0 - 28.0 mmol/L - - - - -   TCO2 22 - 32 mmol/L - - - - 23   ACIDBASEDEF 0.0 - 2.0 mmol/L - - - - -   O2SAT % 53.3 46.8 55.3 59.4 -       Exercise Target Goals: Exercise Program Goal: Individual exercise prescription set using results from initial 6 min walk test and THRR while considering  patients activity barriers and safety.   Exercise Prescription Goal: Initial exercise prescription builds to 30-45 minutes a day of aerobic activity, 2-3 days per week.  Home exercise guidelines will be given to patient during program as part of exercise prescription that the participant will acknowledge.   Education: Aerobic Exercise & Resistance Training: - Gives group verbal and written instruction  on the various components of exercise. Focuses on aerobic and resistive training programs and the benefits of this training and how to safely progress through these programs..   Education: Exercise & Equipment Safety: - Individual verbal instruction and demonstration of equipment use and safety with use of the equipment.   Cardiac Rehab from 12/16/2019 in [facility] Cardiac and Pulmonary Rehab  Date 12/16/19  Educator [place]  Instruction Review Code 1- Verbalizes Understanding      Education: Exercise Physiology & General Exercise Guidelines: - Group verbal and written instruction with models to review the exercise physiology of the cardiovascular system and associated critical values. Provides general exercise guidelines with specific guidelines to those with heart or lung disease.    Education: Flexibility, Balance, Mind/Body Relaxation: Provides group verbal/written instruction on the benefits of flexibility and balance training, including mind/body exercise modes such as yoga, pilates and tai chi.  Demonstration and skill practice provided.   Activity Barriers & Risk Stratification:  Activity Barriers & Cardiac Risk Stratification - 12/11/19 1343      Activity Barriers & Cardiac Risk Stratification   Activity Barriers Balance Concerns;History of Falls;Muscular Weakness;Other (comment)    Comments lupus- toes and ankle numb; trembling (started within the last month)    Cardiac Risk Stratification High           6 Minute Walk:  6 Minute Walk    Row Name 12/16/19 1308         6 Minute Walk   Phase Initial     Distance 1100 feet     Walk Time 6 minutes     # of Rest Breaks 0     MPH 2.08     METS 2.37     RPE 11     Perceived Dyspnea  1     VO2 Peak 8.32     Symptoms No     Resting HR 60 bpm     Resting BP 90/52     Resting Oxygen Saturation  95 %     Exercise Oxygen Saturation  during 6 min walk 98 %     Max Ex. HR 69 bpm     Max Ex. BP 110/58     2 Minute Post BP 96/60             Oxygen Initial Assessment:   Oxygen Re-Evaluation:   Oxygen Discharge (Final Oxygen  Re-Evaluation):   Initial Exercise Prescription:  Initial Exercise Prescription - 12/16/19 1500      Date of Initial Exercise RX and Referring Provider   Date 12/16/19    Referring Provider Leonie Douglas, MD ([place])      Prescription Details   Frequency (times per week) 3    Duration Progress to 30 minutes of continuous aerobic without signs/symptoms of physical distress      Intensity   THRR 40-80% of Max Heartrate 94-128    Ratings of Perceived Exertion 11-13    Perceived Dyspnea 0-4      Progression   Progression Continue to progress workloads to maintain intensity without signs/symptoms of physical distress.      Resistance Training   Training Prescription Yes           Perform Capillary Blood Glucose checks as needed.  Exercise Prescription Changes:  Exercise Prescription Changes    Row Name 12/16/19 1300 12/16/19 1500           Response to Exercise   Blood Pressure (Admit) 90/52 90/52      Blood Pressure (Exercise) 110/58 110/58      Blood Pressure (Exit) 96/60 96/60      Heart Rate (Admit) 60 bpm 60 bpm      Heart Rate (Exercise) 69 bpm 69 bpm      Heart Rate (Exit) 64 bpm 64 bpm      Oxygen Saturation (Admit) 95 % 95 %      Oxygen Saturation (Exercise) 98 % 98 %      Oxygen Saturation (Exit) 97 % 97 %      Rating of Perceived Exertion (Exercise) 11 11      Perceived Dyspnea (Exercise) 1 1      Symptoms none none      Comments Walk Test Results Walk Test Results        Resistance Training   Weight 3 lb 3 lb      Reps 10-15 10-15        Interval Training   Interval Training No No        Treadmill   MPH 1.5 1.5      Grade 0 0      Minutes 15 15      METs 2.15 2.15        Recumbant Bike   Level 1 1      RPM 60 60      Watts 10 10      Minutes 15 15      METs 2.3 2.3        NuStep   Level 1 1      SPM 80 80      Minutes 15 15      METs  2.3 2.3        REL-XR   Level 1 1      Speed -- 50      Minutes 15 15      METs 2.3 2.3        T5 Nustep   Level 1 1      SPM 80 80      Minutes 15 15      METs 2.3 2.3             Exercise Comments:   Exercise Goals and Review:  Exercise Goals    Row Name 12/16/19 1321             Exercise Goals   Increase  Physical Activity Yes       Intervention Provide advice, education, support and counseling about physical activity/exercise needs.;Develop an individualized exercise prescription for aerobic and resistive training based on initial evaluation findings, risk stratification, comorbidities and participant's personal goals.       Expected Outcomes Short Term: Attend rehab on a regular basis to increase amount of physical activity.;Long Term: Add in home exercise to make exercise part of routine and to increase amount of physical activity.;Long Term: Exercising regularly at least 3-5 days a week.       Increase Strength and Stamina Yes       Intervention Provide advice, education, support and counseling about physical activity/exercise needs.;Develop an individualized exercise prescription for aerobic and resistive training based on initial evaluation findings, risk stratification, comorbidities and participant's personal goals.       Expected Outcomes Short Term: Increase workloads from initial exercise prescription for resistance, speed, and METs.;Long Term: Improve cardiorespiratory fitness, muscular endurance and strength as measured by increased METs and functional capacity ( );Short Term: Perform resistance training exercises routinely during rehab and add in resistance training at home       Able to understand and use rate of perceived exertion (RPE) scale Yes       Intervention Provide education and explanation on how to use RPE scale       Expected Outcomes Short Term: Able to use RPE daily in rehab to express subjective intensity level;Long Term:  Able to use RPE to  guide intensity level when exercising independently       Able to understand and use Dyspnea scale Yes       Intervention Provide education and explanation on how to use Dyspnea scale       Expected Outcomes Short Term: Able to use Dyspnea scale daily in rehab to express subjective sense of shortness of breath during exertion;Long Term: Able to use Dyspnea scale to guide intensity level when exercising independently       Knowledge and understanding of Target Heart Rate Range (THRR) Yes       Intervention Provide education and explanation of THRR including how the numbers were predicted and where they are located for reference       Expected Outcomes Short Term: Able to state/look up THRR;Short Term: Able to use daily as guideline for intensity in rehab;Long Term: Able to use THRR to govern intensity when exercising independently       Able to check pulse independently Yes       Intervention Provide education and demonstration on how to check pulse in carotid and radial arteries.;Review the importance of being able to check your own pulse for safety during independent exercise       Expected Outcomes Short Term: Able to explain why pulse checking is important during independent exercise;Long Term: Able to check pulse independently and accurately       Understanding of Exercise Prescription Yes       Intervention Provide education, explanation, and written materials on patient's individual exercise prescription       Expected Outcomes Short Term: Able to explain program exercise prescription;Long Term: Able to explain home exercise prescription to exercise independently              Exercise Goals Re-Evaluation :   Discharge Exercise Prescription (Final Exercise Prescription Changes):  Exercise Prescription Changes - 12/16/19 1500      Response to Exercise   Blood Pressure (Admit) 90/52    Blood Pressure (Exercise) 110/58  Blood Pressure (Exit) 96/60    Heart Rate (Admit) 60 bpm     Heart Rate (Exercise) 69 bpm    Heart Rate (Exit) 64 bpm    Oxygen Saturation (Admit) 95 %    Oxygen Saturation (Exercise) 98 %    Oxygen Saturation (Exit) 97 %    Rating of Perceived Exertion (Exercise) 11    Perceived Dyspnea (Exercise) 1    Symptoms none    Comments Walk Test Results      Resistance Training   Weight 3 lb    Reps 10-15      Interval Training   Interval Training No      Treadmill   MPH 1.5    Grade 0    Minutes 15    METs 2.15      Recumbant Bike   Level 1    RPM 60    Watts 10    Minutes 15    METs 2.3      NuStep   Level 1    SPM 80    Minutes 15    METs 2.3      REL-XR   Level 1    Speed 50    Minutes 15    METs 2.3      T5 Nustep   Level 1    SPM 80    Minutes 15    METs 2.3           Nutrition:  Target Goals: Understanding of nutrition guidelines, daily intake of sodium '1500mg'$ , cholesterol '200mg'$ , calories 30% from fat and 7% or less from saturated fats, daily to have 5 or more servings of fruits and vegetables.  Education: Controlling Sodium/Reading Food Labels -Group verbal and written material supporting the discussion of sodium use in heart healthy nutrition. Review and explanation with models, verbal and written materials for utilization of the food label.   Education: General Nutrition Guidelines/Fats and Fiber: -Group instruction provided by verbal, written material, models and posters to present the general guidelines for heart healthy nutrition. Gives an explanation and review of dietary fats and fiber.   Biometrics:  Pre Biometrics - 12/16/19 1310      Pre Biometrics   Height '5\' 11"'$  (1.803 m)    Weight 154 lb 1.6 oz (69.9 kg)    BMI (Calculated) 21.5    Single Leg Stand 14.28 seconds            Nutrition Therapy Plan and Nutrition Goals:   Nutrition Assessments:  Nutrition Assessments - 12/16/19 1527      MEDFICTS Scores   Pre Score 25           MEDIFICTS Score Key:          ?70 Need to make  dietary changes          40-70 Heart Healthy Diet         ? 40 Therapeutic Level Cholesterol Diet  Nutrition Goals Re-Evaluation:   Nutrition Goals Discharge (Final Nutrition Goals Re-Evaluation):   Psychosocial: Target Goals: Acknowledge presence or absence of significant depression and/or stress, maximize coping skills, provide positive support system. Participant is able to verbalize types and ability to use techniques and skills needed for reducing stress and depression.   Education: Depression - Provides group verbal and written instruction on the correlation between heart/lung disease and depressed mood, treatment options, and the stigmas associated with seeking treatment.   Education: Sleep Hygiene -Provides group verbal and written instruction about how sleep  can affect your health.  Define sleep hygiene, discuss sleep cycles and impact of sleep habits. Review good sleep hygiene tips.     Education: Stress and Anxiety: - Provides group verbal and written instruction about the health risks of elevated stress and causes of high stress.  Discuss the correlation between heart/lung disease and anxiety and treatment options. Review healthy ways to manage with stress and anxiety.    Initial Review & Psychosocial Screening:  Initial Psych Review & Screening - 12/11/19 1338      Initial Review   Current issues with Current Stress Concerns    Source of Stress Concerns Chronic Illness      Family Dynamics   Good Support System? Yes   wife, sons, grandkids     Barriers   Psychosocial barriers to participate in program There are no identifiable barriers or psychosocial needs.      Screening Interventions   Interventions Encouraged to exercise;To provide support and resources with identified psychosocial needs;Provide feedback about the scores to participant    Expected Outcomes Short Term goal: Utilizing psychosocial counselor, staff and physician to assist with identification of  specific Stressors or current issues interfering with healing process. Setting desired goal for each stressor or current issue identified.;Long Term Goal: Stressors or current issues are controlled or eliminated.;Short Term goal: Identification and review with participant of any Quality of Life or Depression concerns found by scoring the questionnaire.;Long Term goal: The participant improves quality of Life and PHQ9 Scores as seen by post scores and/or verbalization of changes           Quality of Life Scores:   Quality of Life - 12/16/19 1526      Quality of Life   Select Quality of Life      Quality of Life Scores   Health/Function Pre 14.9 %    Socioeconomic Pre 22 %    Psych/Spiritual Pre 23.93 %    Family Pre 24 %    GLOBAL Pre 19.56 %          Scores of 19 and below usually indicate a poorer quality of life in these areas.  A difference of  2-3 points is a clinically meaningful difference.  A difference of 2-3 points in the total score of the Quality of Life Index has been associated with significant improvement in overall quality of life, self-image, physical symptoms, and general health in studies assessing change in quality of life.  PHQ-9: Recent Review Flowsheet Data    Depression screen [facility] 2/9 12/16/2019 01/20/2019 01/09/2018   Decreased Interest 0 0 0   Down, Depressed, Hopeless 1 1 0   PHQ - 2 Score 1 1 0   Altered sleeping 3 - -   Tired, decreased energy 3 - -   Change in appetite 1 - -   Feeling bad or failure about yourself  2 - -   Trouble concentrating 1 - -   Moving slowly or fidgety/restless 0 - -   Suicidal thoughts 0 - -   PHQ-9 Score 11 - -   Difficult doing work/chores Somewhat difficult - -     Interpretation of Total Score  Total Score Depression Severity:  1-4 = Minimal depression, 5-9 = Mild depression, 10-14 = Moderate depression, 15-19 = Moderately severe depression, 20-27 = Severe depression   Psychosocial Evaluation and Intervention:   Psychosocial Evaluation - 12/11/19 1351      Psychosocial Evaluation & Interventions   Interventions Encouraged to exercise with the  program and follow exercise prescription    Comments [place] has felt very weak since his CABG 5/15. He has had increased balance issues and weakness in addition to his lupus symptoms. He is very ready to start feeling better and not have to rely so much on his family. His appetite has gotten better and his wife has made sure he sticts to a heart healthy diet.    Expected Outcomes Short: attend cardiac rehab for education and exercise. Long: develop positive self care habits.    Continue Psychosocial Services  Follow up required by staff           Psychosocial Re-Evaluation:   Psychosocial Discharge (Final Psychosocial Re-Evaluation):   Vocational Rehabilitation: Provide vocational rehab assistance to qualifying candidates.   Vocational Rehab Evaluation & Intervention:  Vocational Rehab - 12/11/19 1338      Initial Vocational Rehab Evaluation & Intervention   Assessment shows need for Vocational Rehabilitation No           Education: Education Goals: Education classes will be provided on a variety of topics geared toward better understanding of heart health and risk factor modification. Participant will state understanding/return demonstration of topics presented as noted by education test scores.  Learning Barriers/Preferences:  Learning Barriers/Preferences - 12/11/19 1338      Learning Barriers/Preferences   Learning Barriers None    Learning Preferences None           General Cardiac Education Topics:  AED/CPR: - Group verbal and written instruction with the use of models to demonstrate the basic use of the AED with the basic ABC's of resuscitation.   Anatomy & Physiology of the Heart: - Group verbal and written instruction and models provide basic cardiac anatomy and physiology, with the coronary electrical and arterial systems.  Review of Valvular disease and Heart Failure   Cardiac Procedures: - Group verbal and written instruction to review commonly prescribed medications for heart disease. Reviews the medication, class of the drug, and side effects. Includes the steps to properly store meds and maintain the prescription regimen. (beta blockers and nitrates)   Cardiac Medications I: - Group verbal and written instruction to review commonly prescribed medications for heart disease. Reviews the medication, class of the drug, and side effects. Includes the steps to properly store meds and maintain the prescription regimen.   Cardiac Medications II: -Group verbal and written instruction to review commonly prescribed medications for heart disease. Reviews the medication, class of the drug, and side effects. (all other drug classes)    Go Sex-Intimacy & Heart Disease, Get SMART - Goal Setting: - Group verbal and written instruction through game format to discuss heart disease and the return to sexual intimacy. Provides group verbal and written material to discuss and apply goal setting through the application of the S.M.A.R.T. Method.   Other Matters of the Heart: - Provides group verbal, written materials and models to describe Stable Angina and Peripheral Artery. Includes description of the disease process and treatment options available to the cardiac patient.   Infection Prevention: - Provides verbal and written material to individual with discussion of infection control including proper hand washing and proper equipment cleaning during exercise session.   Cardiac Rehab from 12/16/2019 in [facility] Cardiac and Pulmonary Rehab  Date 12/16/19  Educator [place]  Instruction Review Code 1- Verbalizes Understanding      Falls Prevention: - Provides verbal and written material to individual with discussion of falls prevention and safety.   Cardiac Rehab from 12/16/2019  in [facility] Cardiac and Pulmonary Rehab  Date 12/16/19    Educator [place]  Instruction Review Code 1- Verbalizes Understanding      Other: -Provides group and verbal instruction on various topics (see comments)   Knowledge Questionnaire Score:  Knowledge Questionnaire Score - 12/16/19 1325      Knowledge Questionnaire Score   Pre Score 21/26: Heart Failure, Nutrition, Exercise           Core Components/Risk Factors/Patient Goals at Admission:  Personal Goals and Risk Factors at Admission - 12/16/19 1322      Core Components/Risk Factors/Patient Goals on Admission    Weight Management Weight Maintenance;Yes    Intervention Weight Management: Develop a combined nutrition and exercise program designed to reach desired caloric intake, while maintaining appropriate intake of nutrient and fiber, sodium and fats, and appropriate energy expenditure required for the weight goal.;Weight Management: Provide education and appropriate resources to help participant work on and attain dietary goals.;Weight Management/Obesity: Establish reasonable short term and long term weight goals.    Admit Weight 154 lb 1.6 oz (69.9 kg)    Goal Weight: Short Term 154 lb 1.6 oz (69.9 kg)    Goal Weight: Long Term 154 lb 1.6 oz (69.9 kg)    Expected Outcomes Short Term: Continue to assess and modify interventions until short term weight is achieved;Long Term: Adherence to nutrition and physical activity/exercise program aimed toward attainment of established weight goal;Weight Maintenance: Understanding of the daily nutrition guidelines, which includes 25-35% calories from fat, 7% or less cal from saturated fats, less than '200mg'$  cholesterol, less than 1.5gm of sodium, & 5 or more servings of fruits and vegetables daily;Understanding recommendations for meals to include 15-35% energy as protein, 25-35% energy from fat, 35-60% energy from carbohydrates, less than '200mg'$  of dietary cholesterol, 20-35 gm of total fiber daily;Understanding of distribution of calorie intake  throughout the day with the consumption of 4-5 meals/snacks    Hypertension Yes    Intervention Provide education on lifestyle modifcations including regular physical activity/exercise, weight management, moderate sodium restriction and increased consumption of fresh fruit, vegetables, and low fat dairy, alcohol moderation, and smoking cessation.;Monitor prescription use compliance.    Expected Outcomes Short Term: Continued assessment and intervention until BP is < 140/37m HG in hypertensive participants. < 130/879mHG in hypertensive participants with diabetes, heart failure or chronic kidney disease.;Long Term: Maintenance of blood pressure at goal levels.    Lipids Yes    Intervention Provide education and support for participant on nutrition & aerobic/resistive exercise along with prescribed medications to achieve LDL '70mg'$ , HDL >'40mg'$ .    Expected Outcomes Short Term: Participant states understanding of desired cholesterol values and is compliant with medications prescribed. Participant is following exercise prescription and nutrition guidelines.;Long Term: Cholesterol controlled with medications as prescribed, with individualized exercise RX and with personalized nutrition plan. Value goals: LDL < '70mg'$ , HDL > 40 mg.           Education:Diabetes - Individual verbal and written instruction to review signs/symptoms of diabetes, desired ranges of glucose level fasting, after meals and with exercise. Acknowledge that pre and post exercise glucose checks will be done for 3 sessions at entry of program.   Education: Know Your Numbers and Risk Factors: -Group verbal and written instruction about important numbers in your health.  Discussion of what are risk factors and how they play a role in the disease process.  Review of Cholesterol, Blood Pressure, Diabetes, and BMI and the role they play in your  overall health.   Core Components/Risk Factors/Patient Goals Review:    Core Components/Risk  Factors/Patient Goals at Discharge (Final Review):    ITP Comments:  ITP Comments    Row Name 12/11/19 1354 12/16/19 1307         ITP Comments Initial orientation completed. Diagnosis can be found in [facility] 5/15. EP orientation scheduled for Wednesday, 7/14 at 11 am. Completed 6MWT and gym orientation. Initial ITP created and sent for review to Dr. Emily Filbert, Medical Director.             Comments: Initial ITP

## 2019-12-21 ENCOUNTER — Other Ambulatory Visit: Payer: Self-pay

## 2019-12-21 ENCOUNTER — Encounter: Payer: No Typology Code available for payment source | Admitting: *Deleted

## 2019-12-21 DIAGNOSIS — Z951 Presence of aortocoronary bypass graft: Secondary | ICD-10-CM | POA: Diagnosis not present

## 2019-12-21 NOTE — Progress Notes (Signed)
Daily Session Note  Patient Details  Name: Cody Arellano MRN: [id number] Date of Birth: 1945-10-12 Referring Provider:     Cardiac Rehab from 12/16/2019 in [facility] Cardiac and Pulmonary Rehab  Referring Provider Herold Harms, MD ([place])      Encounter Date: 12/21/2019  Check In:  Session Check In - 12/21/19 1148      Check-In   Supervising physician immediately available to respond to emergencies See telemetry face sheet for immediately available ER MD    Location [facility]-Cardiac & Pulmonary Rehab    Staff Present Nyoka Cowden, RN, BSN, Tyna Jaksch, MS Exercise Physiologist;Kelly Amedeo Plenty, BS, ACSM CEP, Exercise Physiologist    Virtual Visit No    Medication changes reported     No    Tobacco Cessation No Change    Warm-up and Cool-down Performed on first and last piece of equipment    Resistance Training Performed Yes    VAD Patient? No    PAD/SET Patient? No      Pain Assessment   Currently in Pain? No/denies              Social History   Tobacco Use  Smoking Status Former Smoker  . Packs/day: 1.00  . Years: 20.00  . Pack years: 20.00  . Types: Cigarettes  . Quit date: 02/26/1993  . Years since quitting: 26.8  Smokeless Tobacco Never Used  Tobacco Comment   QUIT SMOKING 20 YEARS AGO    Goals Met:  Independence with exercise equipment Exercise tolerated well No report of cardiac concerns or symptoms Strength training completed today  Goals Unmet:  Not Applicable  Comments: Pt able to follow exercise prescription today without complaint.  Will continue to monitor for progression.    Dr. Emily Filbert is Medical Director for [place] and [facility].

## 2019-12-23 ENCOUNTER — Encounter: Payer: No Typology Code available for payment source | Admitting: *Deleted

## 2019-12-23 ENCOUNTER — Other Ambulatory Visit: Payer: Self-pay

## 2019-12-23 DIAGNOSIS — Z951 Presence of aortocoronary bypass graft: Secondary | ICD-10-CM

## 2019-12-23 NOTE — Progress Notes (Signed)
Daily Session Note  Patient Details  Name: Cody Arellano MRN: [id number] Date of Birth: 12-10-1945 Referring Provider:     Cardiac Rehab from 12/16/2019 in [facility] Cardiac and Pulmonary Rehab  Referring Provider Herold Harms, MD ([place])      Encounter Date: 12/23/2019  Check In:  Session Check In - 12/23/19 1048      Check-In   Supervising physician immediately available to respond to emergencies See telemetry face sheet for immediately available ER MD    Location [facility]-Cardiac & Pulmonary Rehab    Staff Present Renita Papa, RN Margurite Auerbach, MS Exercise Physiologist;Amanda Oletta Darter, BA, ACSM CEP, Exercise Physiologist    Virtual Visit No    Medication changes reported     No    Fall or balance concerns reported    No    Warm-up and Cool-down Performed on first and last piece of equipment    Resistance Training Performed Yes    VAD Patient? No    PAD/SET Patient? No      Pain Assessment   Currently in Pain? No/denies              Social History   Tobacco Use  Smoking Status Former Smoker  . Packs/day: 1.00  . Years: 20.00  . Pack years: 20.00  . Types: Cigarettes  . Quit date: 02/26/1993  . Years since quitting: 26.8  Smokeless Tobacco Never Used  Tobacco Comment   QUIT SMOKING 20 YEARS AGO    Goals Met:  Independence with exercise equipment Exercise tolerated well No report of cardiac concerns or symptoms Strength training completed today  Goals Unmet:  Not Applicable  Comments: Pt able to follow exercise prescription today without complaint.  Will continue to monitor for progression.    Dr. Emily Filbert is Medical Director for [place] and [facility].

## 2020-01-04 ENCOUNTER — Encounter: Payer: No Typology Code available for payment source | Attending: Internal Medicine | Admitting: *Deleted

## 2020-01-04 ENCOUNTER — Other Ambulatory Visit: Payer: Self-pay

## 2020-01-04 DIAGNOSIS — Z951 Presence of aortocoronary bypass graft: Secondary | ICD-10-CM | POA: Diagnosis present

## 2020-01-04 NOTE — Progress Notes (Signed)
Daily Session Note  Patient Details  Name: Cody Arellano MRN: [id number] Date of Birth: Sep 09, 1945 Referring Provider:     Cardiac Rehab from 12/16/2019 in [facility] Cardiac and Pulmonary Rehab  Referring Provider Herold Harms, MD ([place])      Encounter Date: 01/04/2020  Check In:  Session Check In - 01/04/20 1120      Check-In   Supervising physician immediately available to respond to emergencies See telemetry face sheet for immediately available ER MD    Location [facility]-Cardiac & Pulmonary Rehab    Staff Present Renita Papa, RN BSN;Joseph Lou Miner, [place] Exercise Physiologist;Kelly Amedeo Plenty, [place], ACSM CEP, Exercise Physiologist    Virtual Visit No    Medication changes reported     No    Fall or balance concerns reported    No    Warm-up and Cool-down Performed on first and last piece of equipment    Resistance Training Performed Yes    VAD Patient? No    PAD/SET Patient? No      Pain Assessment   Currently in Pain? No/denies              Social History   Tobacco Use  Smoking Status Former Smoker  . Packs/day: 1.00  . Years: 20.00  . Pack years: 20.00  . Types: Cigarettes  . Quit date: 02/26/1993  . Years since quitting: 26.8  Smokeless Tobacco Never Used  Tobacco Comment   QUIT SMOKING 20 YEARS AGO    Goals Met:  Independence with exercise equipment Exercise tolerated well No report of cardiac concerns or symptoms Strength training completed today  Goals Unmet:  Not Applicable  Comments: Pt able to follow exercise prescription today without complaint.  Will continue to monitor for progression.    Dr. Emily Filbert is Medical Director for [place] and [facility].

## 2020-01-06 ENCOUNTER — Other Ambulatory Visit: Payer: Self-pay

## 2020-01-06 ENCOUNTER — Encounter: Payer: No Typology Code available for payment source | Admitting: *Deleted

## 2020-01-06 DIAGNOSIS — Z951 Presence of aortocoronary bypass graft: Secondary | ICD-10-CM | POA: Diagnosis not present

## 2020-01-06 NOTE — Progress Notes (Signed)
Daily Session Note  Patient Details  Name: Cody Arellano MRN: [id number] Date of Birth: [birth year] Referring Provider:     Cardiac Rehab from 12/16/2019 in [facility] Cardiac and Pulmonary Rehab  Referring Provider Herold Harms, MD ([place])      Encounter Date: 01/06/2020  Check In:  Session Check In - 01/06/20 1155      Check-In   Supervising physician immediately available to respond to emergencies See telemetry face sheet for immediately available ER MD    Location [facility]-Cardiac & Pulmonary Rehab    Staff Present Renita Papa, RN BSN;Joseph Hood RCP,RRT,BSRT;Heath Lark, RN, BSN, CCRP;Melissa London RDN, Rowe Pavy, BA, ACSM CEP, Exercise Physiologist    Virtual Visit No    Medication changes reported     No    Fall or balance concerns reported    No    Warm-up and Cool-down Performed on first and last piece of equipment    Resistance Training Performed Yes    VAD Patient? No    PAD/SET Patient? No      Pain Assessment   Currently in Pain? No/denies              Social History   Tobacco Use  Smoking Status Former Smoker  . Packs/day: 1.00  . Years: 20.00  . Pack years: 20.00  . Types: Cigarettes  . Quit date: 02/26/1993  . Years since quitting: 26.8  Smokeless Tobacco Never Used  Tobacco Comment   QUIT SMOKING 20 YEARS AGO    Goals Met:  Independence with exercise equipment Exercise tolerated well No report of cardiac concerns or symptoms Strength training completed today  Goals Unmet:  Not Applicable  Comments: Pt able to follow exercise prescription today without complaint.  Will continue to monitor for progression.    Dr. Emily Filbert is Medical Director for [place] and [facility].

## 2020-01-08 ENCOUNTER — Other Ambulatory Visit: Payer: Self-pay

## 2020-01-08 ENCOUNTER — Encounter: Payer: No Typology Code available for payment source | Admitting: *Deleted

## 2020-01-08 DIAGNOSIS — Z951 Presence of aortocoronary bypass graft: Secondary | ICD-10-CM

## 2020-01-08 NOTE — Progress Notes (Signed)
Daily Session Note  Patient Details  Name: Cody Arellano MRN: [id number] Date of Birth: March 14, 1946 Referring Provider:     Cardiac Rehab from 12/16/2019 in [facility] Cardiac and Pulmonary Rehab  Referring Provider Herold Harms, MD ([place])      Encounter Date: 01/08/2020  Check In:  Session Check In - 01/08/20 1118      Check-In   Supervising physician immediately available to respond to emergencies See telemetry face sheet for immediately available ER MD    Location [facility]-Cardiac & Pulmonary Rehab    Staff Present Renita Papa, RN BSN;Joseph Lou Miner, [place] Exercise Physiologist;Krista Frederico Hamman, RN BSN    Virtual Visit No    Medication changes reported     No    Fall or balance concerns reported    No    Warm-up and Cool-down Performed on first and last piece of equipment    Resistance Training Performed Yes    VAD Patient? No    PAD/SET Patient? No      Pain Assessment   Currently in Pain? No/denies              Social History   Tobacco Use  Smoking Status Former Smoker  . Packs/day: 1.00  . Years: 20.00  . Pack years: 20.00  . Types: Cigarettes  . Quit date: 02/26/1993  . Years since quitting: 26.8  Smokeless Tobacco Never Used  Tobacco Comment   QUIT SMOKING 20 YEARS AGO    Goals Met:  Independence with exercise equipment Exercise tolerated well No report of cardiac concerns or symptoms Strength training completed today  Goals Unmet:  Not Applicable  Comments: Pt able to follow exercise prescription today without complaint.  Will continue to monitor for progression.    Dr. Emily Filbert is Medical Director for [place] and [facility].

## 2020-01-11 ENCOUNTER — Encounter: Payer: No Typology Code available for payment source | Admitting: *Deleted

## 2020-01-11 ENCOUNTER — Other Ambulatory Visit: Payer: Self-pay

## 2020-01-11 DIAGNOSIS — Z951 Presence of aortocoronary bypass graft: Secondary | ICD-10-CM

## 2020-01-11 NOTE — Progress Notes (Signed)
Daily Session Note  Patient Details  Name: Cody Arellano MRN: [id number] Date of Birth: [birth year] Referring Provider:     Cardiac Rehab from 12/16/2019 in [facility] Cardiac and Pulmonary Rehab  Referring Provider Herold Harms, MD ([place])      Encounter Date: 01/11/2020  Check In:  Session Check In - 01/11/20 1115      Check-In   Supervising physician immediately available to respond to emergencies See telemetry face sheet for immediately available ER MD    Location [facility]-Cardiac & Pulmonary Rehab    Staff Present Renita Papa, RN BSN;Joseph Lou Miner, [place] Exercise Physiologist;Kelly Amedeo Plenty, [place], ACSM CEP, Exercise Physiologist    Virtual Visit No    Medication changes reported     No    Fall or balance concerns reported    No    Warm-up and Cool-down Performed on first and last piece of equipment    Resistance Training Performed Yes    VAD Patient? No    PAD/SET Patient? No      Pain Assessment   Currently in Pain? No/denies              Social History   Tobacco Use  Smoking Status Former Smoker  . Packs/day: 1.00  . Years: 20.00  . Pack years: 20.00  . Types: Cigarettes  . Quit date: 02/26/1993  . Years since quitting: 26.8  Smokeless Tobacco Never Used  Tobacco Comment   QUIT SMOKING 20 YEARS AGO    Goals Met:  Independence with exercise equipment Exercise tolerated well No report of cardiac concerns or symptoms Strength training completed today  Goals Unmet:  Not Applicable  Comments: Pt able to follow exercise prescription today without complaint.  Will continue to monitor for progression.    Dr. Emily Filbert is Medical Director for [place] and [facility].

## 2020-01-13 ENCOUNTER — Encounter: Payer: Self-pay | Admitting: *Deleted

## 2020-01-13 ENCOUNTER — Encounter: Payer: No Typology Code available for payment source | Admitting: *Deleted

## 2020-01-13 ENCOUNTER — Other Ambulatory Visit: Payer: Self-pay

## 2020-01-13 DIAGNOSIS — Z951 Presence of aortocoronary bypass graft: Secondary | ICD-10-CM | POA: Diagnosis not present

## 2020-01-13 NOTE — Progress Notes (Signed)
Cardiac Individual Treatment Plan  Patient Details  Name: Cody Arellano MRN: [id number] Date of Birth: 16-May-1946 Referring Provider:     Cardiac Rehab from 12/16/2019 in [facility] Cardiac and Pulmonary Rehab  Referring Provider Herold Harms, MD ([place])      Initial Encounter Date:    Cardiac Rehab from 12/16/2019 in [facility] Cardiac and Pulmonary Rehab  Date 12/16/19      Visit Diagnosis: S/P CABG x 2  Patient's Home Medications on Admission:  Current Outpatient Medications:  .  amiodarone (PACERONE) 200 MG tablet, Please take 1 tab (260m) twice a day for 2 weeks and then 1 tab (2027m daily until we see you in follow-up. (Patient taking differently: Take 200 mg by mouth every evening. ), Disp: 60 tablet, Rfl: 1 .  apixaban (ELIQUIS) 2.5 MG TABS tablet, Take 1 tablet (2.5 mg total) by mouth 2 (two) times daily., Disp: 60 tablet, Rfl: 1 .  aspirin EC 81 MG tablet, Take 1 tablet (81 mg total) by mouth daily., Disp: , Rfl:  .  azaTHIOprine (IMURAN) 50 MG tablet, Take 50 mg by mouth in the morning and at bedtime. , Disp: , Rfl: 2 .  hydroxychloroquine (PLAQUENIL) 200 MG tablet, Take 400 mg by mouth every evening. , Disp: , Rfl:  .  losartan (COZAAR) 25 MG tablet, Take 0.5 tablets (12.5 mg total) by mouth daily., Disp: 30 tablet, Rfl: 1 .  oxyCODONE (OXY IR/ROXICODONE) 5 MG immediate release tablet, Take 1 tablet (5 mg total) by mouth every 6 (six) hours as needed for severe pain., Disp: 30 tablet, Rfl: 0 .  rosuvastatin (CRESTOR) 40 MG tablet, Take 1 tablet (40 mg total) by mouth daily., Disp: 30 tablet, Rfl: 1 .  sertraline (ZOLOFT) 50 MG tablet, Take 50 mg by mouth daily. (Patient not taking: Reported on 11/16/2019), Disp: , Rfl:  .  spironolactone (ALDACTONE) 25 MG tablet, Take 0.5 tablets (12.5 mg total) by mouth daily., Disp: 30 tablet, Rfl: 1 .  tamsulosin (FLOMAX) 0.4 MG CAPS capsule, Take 0.4 mg by mouth every evening. , Disp: , Rfl:   Past Medical History: Past Medical History:    Diagnosis Date  . Anxiety   . Chronic tension headache   . Colon polyps   . Coronary artery disease 2008/2009   MI with PCI x 2, then PCI x 1 in 2009  . Depression   . Dyslipidemia   . Dysrhythmia    atrial fibrillation  . Hyperlipidemia   . Hypertension   [facility]   [facility] Ha[facility]udie Reed. Lupus disease of the lung   . Lupus pericarditis ([facility]  . Myocardial infarction ([place]2008  . 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 Shortness of breath     Tobacco Use: Social History   Tobacco Use  Smoking Status Former Smoker  . Packs/day: 1.00  . Years: 20.00  . Pack years: 20.00  . Types: Cigarettes  . Quit date: 02/26/1993  . Years since quitting: 26.8  Smokeless Tobacco Never Used  Tobacco Comment   QUIT SMOKING 20 YEARS AGO    Labs: Recent Review Flowsheet Data    Labs for ITP Cardiac and Pulmonary Rehab Latest Ref Rng & Units 10/24/2019 10/25/2019 10/25/2019 10/26/2019 11/12/2019   Cholestrol 0 - 200 mg/dL - - - - -   LDLCALC 0 - 99 mg/dL - - - - -   HDL >40  mg/dL - - - - -   Trlycerides <150 mg/dL - - - - -   Hemoglobin A1c 4.8 - 5.6 % - - - - -   PHART 7.35 - 7.45 - - - - -   PCO2ART 32 - 48 mmHg - - - - -   HCO3 20.0 - 28.0 mmol/L - - - - -   TCO2 22 - 32 mmol/L - - - - 23   ACIDBASEDEF 0.0 - 2.0 mmol/L - - - - -   O2SAT % 53.3 46.8 55.3 59.4 -       Exercise Target Goals: Exercise Program Goal: Individual exercise prescription set using results from initial 6 min walk test and THRR while considering  patient's activity barriers and safety.   Exercise Prescription Goal: Initial exercise prescription builds to 30-45 minutes a day of aerobic activity, 2-3 days per week.  Home exercise guidelines will be given to patient during program as part of exercise prescription that the participant will acknowledge.   Education: Aerobic Exercise & Resistance Training: - Gives group verbal and written instruction  on the various components of exercise. Focuses on aerobic and resistive training programs and the benefits of this training and how to safely progress through these programs..   Education: Exercise & Equipment Safety: - Individual verbal instruction and demonstration of equipment use and safety with use of the equipment.   Cardiac Rehab from 01/06/2020 in [facility] Cardiac and Pulmonary Rehab  Date 12/16/19  Educator [place]  Instruction Review Code 1- Verbalizes Understanding      Education: Exercise Physiology & General Exercise Guidelines: - Group verbal and written instruction with models to review the exercise physiology of the cardiovascular system and associated critical values. Provides general exercise guidelines with specific guidelines to those with heart or lung disease.    Education: Flexibility, Balance, Mind/Body Relaxation: Provides group verbal/written instruction on the benefits of flexibility and balance training, including mind/body exercise modes such as yoga, pilates and tai chi.  Demonstration and skill practice provided.   Activity Barriers & Risk Stratification:  Activity Barriers & Cardiac Risk Stratification - 12/11/19 1343      Activity Barriers & Cardiac Risk Stratification   Activity Barriers Balance Concerns;History of Falls;Muscular Weakness;Other (comment)    Comments lupus- toes and ankle numb; trembling (started within the last month)    Cardiac Risk Stratification High           6 Minute Walk:  6 Minute Walk    Row Name 12/16/19 1308         6 Minute Walk   Phase Initial     Distance 1100 feet     Walk Time 6 minutes     # of Rest Breaks 0     MPH 2.08     METS 2.37     RPE 11     Perceived Dyspnea  1     VO2 Peak 8.32     Symptoms No     Resting HR 60 bpm     Resting BP 90/52     Resting Oxygen Saturation  95 %     Exercise Oxygen Saturation  during 6 min walk 98 %     Max Ex. HR 69 bpm     Max Ex. BP 110/58     2 Minute Post BP 96/60              Oxygen Initial Assessment:   Oxygen Re-Evaluation:   Oxygen Discharge (Final  Oxygen Re-Evaluation):   Initial Exercise Prescription:  Initial Exercise Prescription - 12/17/19 0700      Date of Initial Exercise RX and Referring Provider   Date 12/16/19    Referring Provider Herold Harms, [place] ([place])      Treadmill   MPH 1.5    Grade 0    Minutes 15    METs 2.15      Recumbant Bike   Level 1    RPM 60    Watts 10    Minutes 15    METs 2.3      NuStep   Level 1    SPM 80    Minutes 15    METs 2.3      REL-XR   Level 1    Speed 50    Minutes 15    METs 2.3      T5 Nustep   Level 1    SPM 80    Minutes 15    METs 2.3      Prescription Details   Frequency (times per week) 3      Intensity   THRR 40-80% of Max Heartrate 94-128    Ratings of Perceived Exertion 11-13    Perceived Dyspnea 0-4      Progression   Progression Continue to progress workloads to maintain intensity without signs/symptoms of physical distress.      Resistance Training   Training Prescription Yes    Weight 3 lb    Reps 10-15           Perform Capillary Blood Glucose checks as needed.  Exercise Prescription Changes:  Exercise Prescription Changes    Row Name 12/16/19 1300 12/16/19 1500 12/22/19 1200         Response to Exercise   Blood Pressure (Admit) 90/52 90/52 110/58     Blood Pressure (Exercise) 110/58 110/58 106/56     Blood Pressure (Exit) 96/60 96/60 102/62     Heart Rate (Admit) 60 bpm 60 bpm 76 bpm     Heart Rate (Exercise) 69 bpm 69 bpm 94 bpm     Heart Rate (Exit) 64 bpm 64 bpm 63 bpm     Oxygen Saturation (Admit) 95 % 95 % --     Oxygen Saturation (Exercise) 98 % 98 % --     Oxygen Saturation (Exit) 97 % 97 % --     Rating of Perceived Exertion (Exercise) _0 Perceived Dyspnea (Exercise) 1 1 --     Symptoms none none none     Comments Walk Test Results Walk Test Results --     Duration -- -- Progress to 30 minutes of  aerobic  without signs/symptoms of physical distress     Intensity -- -- THRR unchanged       Progression   Progression -- -- Continue to progress workloads to maintain intensity without signs/symptoms of physical distress.     Average METs -- -- 2.66       Resistance Training   Training Prescription -- -- Yes     Weight 3 lb 3 lb 3 lb     Reps 10-15 10-15 10-15       Interval Training   Interval Training No No No       Treadmill   MPH 1.5 1.5 --     Grade 0 0 --     Minutes 15 15 --     METs 2.15 2.15 --  Recumbant Bike   Level _0 RPM 60 60 60     Watts 10 10 --     Minutes _1 METs 2.3 2.3 3.12       NuStep   Level _2 SPM 80 80 80     Minutes _3 METs 2.3 2.3 2.2       REL-XR   Level 1 1 --     Speed -- 50 --     Minutes 15 15 --     METs 2.3 2.3 --       T5 Nustep   Level 1 1 --     SPM 80 80 --     Minutes 15 15 --     METs 2.3 2.3 --            Exercise Comments:   Exercise Goals and Review:  Exercise Goals    Row Name 12/16/19 1321             Exercise Goals   Increase Physical Activity Yes       Intervention Provide advice, education, support and counseling about physical activity/exercise needs.;Develop an individualized exercise prescription for aerobic and resistive training based on initial evaluation findings, risk stratification, comorbidities and participant's personal goals.       Expected Outcomes Short Term: Attend rehab on a regular basis to increase amount of physical activity.;Long Term: Add in home exercise to make exercise part of routine and to increase amount of physical activity.;Long Term: Exercising regularly at least 3-5 days a week.       Increase Strength and Stamina Yes       Intervention Provide advice, education, support and counseling about physical activity/exercise needs.;Develop an individualized exercise prescription for aerobic and resistive training based on initial evaluation  findings, risk stratification, comorbidities and participant's personal goals.       Expected Outcomes Short Term: Increase workloads from initial exercise prescription for resistance, speed, and METs.;Long Term: Improve cardiorespiratory fitness, muscular endurance and strength as measured by increased METs and functional capacity (6MWT);Short Term: Perform resistance training exercises routinely during rehab and add in resistance training at home       Able to understand and use rate of perceived exertion (RPE) scale Yes       Intervention Provide education and explanation on how to use RPE scale       Expected Outcomes Short Term: Able to use RPE daily in rehab to express subjective intensity level;Long Term:  Able to use RPE to guide intensity level when exercising independently       Able to understand and use Dyspnea scale Yes       Intervention Provide education and explanation on how to use Dyspnea scale       Expected Outcomes Short Term: Able to use Dyspnea scale daily in rehab to express subjective sense of shortness of breath during exertion;Long Term: Able to use Dyspnea scale to guide intensity level when exercising independently       Knowledge and understanding of Target Heart Rate Range (THRR) Yes       Intervention Provide education and explanation of THRR including how the numbers were predicted and where they are located for reference       Expected Outcomes Short Term: Able to state/look up THRR;Short Term: Able to use daily as guideline for intensity in  rehab;Long Term: Able to use THRR to govern intensity when exercising independently       Able to check pulse independently Yes       Intervention Provide education and demonstration on how to check pulse in carotid and radial arteries.;Review the importance of being able to check your own pulse for safety during independent exercise       Expected Outcomes Short Term: Able to explain why pulse checking is important during  independent exercise;Long Term: Able to check pulse independently and accurately       Understanding of Exercise Prescription Yes       Intervention Provide education, explanation, and written materials on patient's individual exercise prescription       Expected Outcomes Short Term: Able to explain program exercise prescription;Long Term: Able to explain home exercise prescription to exercise independently              Exercise Goals Re-Evaluation :  Exercise Goals Re-Evaluation    Row Name 12/22/19 1248 01/04/20 1116           Exercise Goal Re-Evaluation   Exercise Goals Review Increase Physical Activity;Increase Strength and Stamina;Understanding of Exercise Prescription Increase Physical Activity;Increase Strength and Stamina;Understanding of Exercise Prescription      Comments First full day of exercise!  Patient was oriented to gym and equipment including functions, settings, policies, and procedures.  Patient's individual exercise prescription and treatment plan were reviewed.  All starting workloads were established based on the results of the 6 minute walk test done at initial orientation visit.  The plan for exercise progression was also introduced and progression will be customized based on patient's performance and goals. Curran has been coming to rehab, but has been missing some sessions. He feels better and is ready to exercise again. Lando does not currently do exercise at home but staff will review home exercise instructions when ready.      Expected Outcomes Short: Use RPE daily to regulate intensity. Long: Follow program prescription in THR. Short: Review home exercise Long: Able to exercise indepedently at home following [facility]             Discharge Exercise Prescription (Final Exercise Prescription Changes):  Exercise Prescription Changes - 12/22/19 1200      Response to Exercise   Blood Pressure (Admit) 110/58    Blood Pressure (Exercise) 106/56    Blood Pressure  (Exit) 102/62    Heart Rate (Admit) 76 bpm    Heart Rate (Exercise) 94 bpm    Heart Rate (Exit) 63 bpm    Rating of Perceived Exertion (Exercise) 12    Symptoms none    Duration Progress to 30 minutes of  aerobic without signs/symptoms of physical distress    Intensity THRR unchanged      Progression   Progression Continue to progress workloads to maintain intensity without signs/symptoms of physical distress.    Average METs 2.66      Resistance Training   Training Prescription Yes    Weight 3 lb    Reps 10-15      Interval Training   Interval Training No      Recumbant Bike   Level 1    RPM 60    Minutes 15    METs 3.12      NuStep   Level 1    SPM 80    Minutes 15    METs 2.2           Nutrition:  Target Goals:  Understanding of nutrition guidelines, daily intake of sodium <1540m, cholesterol <2039m calories 30% from fat and 7% or less from saturated fats, daily to have 5 or more servings of fruits and vegetables.  Education: Controlling Sodium/Reading Food Labels -Group verbal and written material supporting the discussion of sodium use in heart healthy nutrition. Review and explanation with models, verbal and written materials for utilization of the food label.   Education: General Nutrition Guidelines/Fats and Fiber: -Group instruction provided by verbal, written material, models and posters to present the general guidelines for heart healthy nutrition. Gives an explanation and review of dietary fats and fiber.   Biometrics:  Pre Biometrics - 12/16/19 1310      Pre Biometrics   Height 5' 11" (1.803 m)    Weight 154 lb 1.6 oz (69.9 kg)    BMI (Calculated) 21.5    Single Leg Stand 14.28 seconds            Nutrition Therapy Plan and Nutrition Goals:   Nutrition Assessments:  Nutrition Assessments - 12/16/19 1527      MEDFICTS Scores   Pre Score 25           MEDIFICTS Score Key:          ?70 Need to make dietary changes          40-70  Heart Healthy Diet         ? 40 Therapeutic Level Cholesterol Diet  Nutrition Goals Re-Evaluation:   Nutrition Goals Discharge (Final Nutrition Goals Re-Evaluation):   Psychosocial: Target Goals: Acknowledge presence or absence of significant depression and/or stress, maximize coping skills, provide positive support system. Participant is able to verbalize types and ability to use techniques and skills needed for reducing stress and depression.   Education: Depression - Provides group verbal and written instruction on the correlation between heart/lung disease and depressed mood, treatment options, and the stigmas associated with seeking treatment.   [facility]  Date 01/06/20  [facility]ruction Review Code 1- VeUnited States Steel Corporationnderstanding      Education: Sleep Hygiene -Provides group verbal and written instruction about how sleep can affect your health.  Define sleep hygiene, discuss sleep cycles and impact of sleep habits. Review good sleep hygiene tips.     Education: Stress and Anxiety: - Provides group verbal and written instruction about the health risks of elevated stress and causes of high stress.  Discuss the correlation between heart/lung disease and anxiety and treatment options. Review healthy ways to manage with stress and anxiety.   [facility]  Date 01/06/20  [facility] Review Code 1- Verbalizes Understanding       Initial Review & Psychosocial Screening:  Initial Psych Review & Screening - 12/11/19 1338      Initial Review   Current issues with Current Stress Concerns    Source of Stress Concerns Chronic Illness      Family Dynamics   Good Support System? Yes   wife, sons, grandkids     Barriers   Psychosocial barriers to participate in program There are no identifiable barriers or psychosocial needs.      Screening Interventions    Interventions Encouraged to exercise;To provide support and resources with identified psychosocial needs;Provide feedback about the scores to participant    Expected Outcomes Short Term goal: Utilizing psychosocial counselor, staff and physician to assist with identification of specific Stressors or current  issues interfering with healing process. Setting desired goal for each stressor or current issue identified.;Long Term Goal: Stressors or current issues are controlled or eliminated.;Short Term goal: Identification and review with participant of any Quality of Life or Depression concerns found by scoring the questionnaire.;Long Term goal: The participant improves quality of Life and PHQ9 Scores as seen by post scores and/or verbalization of changes           Quality of Life Scores:   Quality of Life - 12/16/19 1526      Quality of Life   Select Quality of Life      Quality of Life Scores   Health/Function Pre 14.9 %    Socioeconomic Pre 22 %    Psych/Spiritual Pre 23.93 %    Family Pre 24 %    GLOBAL Pre 19.56 %          Scores of 19 and below usually indicate a poorer quality of life in these areas.  A difference of  2-3 points is a clinically meaningful difference.  A difference of 2-3 points in the total score of the Quality of Life Index has been associated with significant improvement in overall quality of life, self-image, physical symptoms, and general health in studies assessing change in quality of life.  PHQ-9: Recent Review Flowsheet Data    Depression screen [facility] 2/9 12/16/2019 01/20/2019 01/09/2018   Decreased Interest 0 0 0   Down, Depressed, Hopeless 1 1 0   PHQ - 2 Score 1 1 0   Altered sleeping 3 - -   Tired, decreased energy 3 - -   Change in appetite 1 - -   Feeling bad or failure about yourself  2 - -   Trouble concentrating 1 - -   Moving slowly or fidgety/restless 0 - -   Suicidal thoughts 0 - -   PHQ-9 Score 11 - -   Difficult doing work/chores Somewhat  difficult - -     Interpretation of Total Score  Total Score Depression Severity:  1-4 = Minimal depression, 5-9 = Mild depression, 10-14 = Moderate depression, 15-19 = Moderately severe depression, 20-27 = Severe depression   Psychosocial Evaluation and Intervention:  Psychosocial Evaluation - 12/11/19 1351      Psychosocial Evaluation & Interventions   Interventions Encouraged to exercise with the program and follow exercise prescription    Comments Brentt has felt very weak since his CABG 5/15. He has had increased balance issues and weakness in addition to his lupus symptoms. He is very ready to start feeling better and not have to rely so much on his family. His appetite has gotten better and his wife has made sure he sticts to a heart healthy diet.    Expected Outcomes Short: attend cardiac rehab for education and exercise. Long: develop positive self care habits.    Continue Psychosocial Services  Follow up required by staff           Psychosocial Re-Evaluation:  Psychosocial Re-Evaluation    [place] Name 01/04/20 1134             Psychosocial Re-Evaluation   Comments Emery struggles staying asleep, but also has been sick so he thinks that was has been contributing to it. Overall, he reports feeling well mentally. He states that he is getting around better.       Expected Outcomes Short: Utilize exercise to help maintain better sleep patterns Long: Maintain positive attitude       Continue Psychosocial  Services  Follow up required by staff              Psychosocial Discharge (Final Psychosocial Re-Evaluation):  Psychosocial Re-Evaluation - 01/04/20 1134      Psychosocial Re-Evaluation   Comments Yaser struggles staying asleep, but also has been sick so he thinks that was has been contributing to it. Overall, he reports feeling well mentally. He states that he is getting around better.    Expected Outcomes Short: Utilize exercise to help maintain better sleep patterns Long:  Maintain positive attitude    Continue Psychosocial Services  Follow up required by staff           Vocational Rehabilitation: Provide vocational rehab assistance to qualifying candidates.   Vocational Rehab Evaluation & Intervention:  Vocational Rehab - 12/11/19 1338      Initial Vocational Rehab Evaluation & Intervention   Assessment shows need for Vocational Rehabilitation No           Education: Education Goals: Education classes will be provided on a variety of topics geared toward better understanding of heart health and risk factor modification. Participant will state understanding/return demonstration of topics presented as noted by education test scores.  Learning Barriers/Preferences:  Learning Barriers/Preferences - 12/11/19 1338      Learning Barriers/Preferences   Learning Barriers None    Learning Preferences None           General Cardiac Education Topics:  AED/CPR: - Group verbal and written instruction with the use of models to demonstrate the basic use of the AED with the basic ABC's of resuscitation.   Anatomy & Physiology of the Heart: - Group verbal and written instruction and models provide basic cardiac anatomy and physiology, with the coronary electrical and arterial systems. Review of Valvular disease and Heart Failure   Cardiac Procedures: - Group verbal and written instruction to review commonly prescribed medications for heart disease. Reviews the medication, class of the drug, and side effects. Includes the steps to properly store meds and maintain the prescription regimen. (beta blockers and nitrates)   Cardiac Medications I: - Group verbal and written instruction to review commonly prescribed medications for heart disease. Reviews the medication, class of the drug, and side effects. Includes the steps to properly store meds and maintain the prescription regimen.   Cardiac Medications II: -Group verbal and written instruction to  review commonly prescribed medications for heart disease. Reviews the medication, class of the drug, and side effects. (all other drug classes)    Go Sex-Intimacy & Heart Disease, Get SMART - Goal Setting: - Group verbal and written instruction through game format to discuss heart disease and the return to sexual intimacy. Provides group verbal and written material to discuss and apply goal setting through the application of the S.M.A.R.T. Method.   Other Matters of the Heart: - Provides group verbal, written materials and models to describe Stable Angina and Peripheral Artery. Includes description of the disease process and treatment options available to the cardiac patient.   Infection Prevention: - Provides verbal and written material to individual with discussion of infection control including proper hand washing and proper equipment cleaning during exercise session.   Cardiac Rehab [facility]/06/2020 in [facility] [facility]  Date 12/16/19  Educator Crawford  Instruction Review Code 1- Verbalizes Understanding      Falls Prevention: - Provides verbal and written material to individual with discussion of falls prevention and safety.   Cardiac Rehab from 01/06/2020 in [facility] Cardiac and Pulmonary  Rehab  Date 12/16/19  Educator Byron  Instruction Review Code 1- Verbalizes Understanding      Other: -Provides group and verbal instruction on various topics (see comments)   Knowledge Questionnaire Score:  Knowledge Questionnaire Score - 12/16/19 1325      Knowledge Questionnaire Score   Pre Score 21/26: Heart Failure, Nutrition, Exercise           Core Components/Risk Factors/Patient Goals at Admission:  Personal Goals and Risk Factors at Admission - 12/16/19 1322      Core Components/Risk Factors/Patient Goals on Admission    Weight Management Weight Maintenance;Yes    Intervention Weight Management: Develop a combined nutrition and exercise program designed to reach desired  caloric intake, while maintaining appropriate intake of nutrient and fiber, sodium and fats, and appropriate energy expenditure required for the weight goal.;Weight Management: Provide education and appropriate resources to help participant work on and attain dietary goals.;Weight Management/Obesity: Establish reasonable short term and long term weight goals.    Admit Weight 154 lb 1.6 oz (69.9 kg)    Goal Weight: Short Term 154 lb 1.6 oz (69.9 kg)    Goal Weight: Long Term 154 lb 1.6 oz (69.9 kg)    Expected Outcomes Short Term: Continue to assess and modify interventions until short term weight is achieved;Long Term: Adherence to nutrition and physical activity/exercise program aimed toward attainment of established weight goal;Weight Maintenance: Understanding of the daily nutrition guidelines, which includes 25-35% calories from fat, 7% or less cal from saturated fats, less than 255m cholesterol, less than 1.5gm of sodium, & 5 or more servings of fruits and vegetables daily;Understanding recommendations for meals to include 15-35% energy as protein, 25-35% energy from fat, 35-60% energy from carbohydrates, less than 209mof dietary cholesterol, 20-35 gm of total fiber daily;Understanding of distribution of calorie intake throughout the day with the consumption of 4-5 meals/snacks    Hypertension Yes    Intervention Provide education on lifestyle modifcations including regular physical activity/exercise, weight management, moderate sodium restriction and increased consumption of fresh fruit, vegetables, and low fat dairy, alcohol moderation, and smoking cessation.;Monitor prescription use compliance.    Expected Outcomes Short Term: Continued assessment and intervention until BP is < 140/9076mG in hypertensive participants. < 130/69m35m in hypertensive participants with diabetes, heart failure or chronic kidney disease.;Long Term: Maintenance of blood pressure at goal levels.    Lipids Yes     Intervention Provide education and support for participant on nutrition & aerobic/resistive exercise along with prescribed medications to achieve LDL <70mg28mL >40mg.34mExpected Outcomes Short Term: Participant states understanding of desired cholesterol values and is compliant with medications prescribed. Participant is following exercise prescription and nutrition guidelines.;Long Term: Cholesterol controlled with medications as prescribed, with individualized exercise RX and with personalized nutrition plan. Value goals: LDL < 70mg, 64m> 40 mg.           Education:Diabetes - Individual verbal and written instruction to review signs/symptoms of diabetes, desired ranges of glucose level fasting, after meals and with exercise. Acknowledge that pre and post exercise glucose checks will be done for 3 sessions at entry of program.   Education: Know Your Numbers and Risk Factors: -Group verbal and written instruction about important numbers in your health.  Discussion of what are risk factors and how they play a role in the disease process.  Review of Cholesterol, Blood Pressure, Diabetes, and BMI and the role they play in your overall health.   Core Components/Risk  Factors/Patient Goals Review:   Goals and Risk Factor Review    Row Name 01/04/20 1121             Core Components/Risk Factors/Patient Goals Review   Personal Goals Review Weight Management/Obesity;Hypertension;Lipids       Review Jaqua has been doing well, weight has been maintained. BPs at rehab have been stable. He reports he has a BP monitor at home but is unsure if it works. Encouraged to bring it to rehab to check function. Tramaine states he will buy a new one if it continues to not work. Him and his wife cook home a lot and has been eating fairly healthy.       Expected Outcomes Short: Check BP at home, obtain new monitor if it continues to not work Long: Continue to manage lifestyle risk factors              Core  Components/Risk Factors/Patient Goals at Discharge (Final Review):   Goals and Risk Factor Review - 01/04/20 1121      Core Components/Risk Factors/Patient Goals Review   Personal Goals Review Weight Management/Obesity;Hypertension;Lipids    Review Aqib has been doing well, weight has been maintained. BPs at rehab have been stable. He reports he has a BP monitor at home but is unsure if it works. Encouraged to bring it to rehab to check function. Elior states he will buy a new one if it continues to not work. Him and his wife cook home a lot and has been eating fairly healthy.    Expected Outcomes Short: Check BP at home, obtain new monitor if it continues to not work Long: Continue to manage lifestyle risk factors           ITP Comments:  ITP Comments    Row Name 12/11/19 1354 12/16/19 1307 01/13/20 0728       ITP Comments Initial orientation completed. Diagnosis can be found in [facility] 5/15. EP orientation scheduled for Wednesday, 7/14 at 11 am. Completed 6MWT and gym orientation. Initial ITP created and sent for review to Dr. Emily Filbert, Medical Director. 30 Day review completed. Medical Director ITP review done, changes made as directed, and signed approval by Medical Director.            Comments:

## 2020-01-13 NOTE — Progress Notes (Signed)
Daily Session Note  Patient Details  Name: Cody Arellano MRN: [id number] Date of Birth: 1945-10-02 Referring Provider:     Cardiac Rehab from 12/16/2019 in [facility] Cardiac [facility] Pulmonary Rehab  Referring Provider Herold Harms, MD ([place])      Encounter Date: 01/13/2020  Check In:  Session Check In - 01/13/20 1108      Check-In   Supervising physician immediately available to respond to emergencies See telemetry face sheet for immediately available ER MD    Location [facility]-Cardiac & Pulmonary Rehab    Staff Present Renita Papa, RN BSN;Joseph Hood RCP,RRT,BSRT;Melissa Niota RDN, Rowe Pavy, BA, ACSM CEP, Exercise Physiologist    Virtual Visit No    Medication changes reported     No    Fall or balance concerns reported    No    Warm-up and Cool-down Performed on first and last piece of equipment    Resistance Training Performed Yes    VAD Patient? No    PAD/SET Patient? No      Pain Assessment   Currently in Pain? No/denies              Social History   Tobacco Use  Smoking Status Former Smoker  . Packs/day: 1.00  . Years: 20.00  . Pack years: 20.00  . Types: Cigarettes  . Quit date: 02/26/1993  . Years since quitting: 26.8  Smokeless Tobacco Never Used  Tobacco Comment   QUIT SMOKING 20 YEARS AGO    Goals Met:  Independence with exercise equipment Exercise tolerated well No report of cardiac concerns or symptoms Strength training completed today  Goals Unmet:  Not Applicable  Comments: Pt able to follow exercise prescription today without complaint.  Will continue to monitor for progression.    Dr. Emily Filbert is Medical Director for [place] and [facility].

## 2020-01-15 ENCOUNTER — Encounter: Payer: No Typology Code available for payment source | Admitting: *Deleted

## 2020-01-15 ENCOUNTER — Other Ambulatory Visit: Payer: Self-pay

## 2020-01-15 DIAGNOSIS — Z951 Presence of aortocoronary bypass graft: Secondary | ICD-10-CM | POA: Diagnosis not present

## 2020-01-15 NOTE — Progress Notes (Signed)
Daily Session Note  Patient Details  Name: ROMIR KLIMOWICZ MRN: [id number] Date of Birth: 01-18-1946 Referring Provider:     Cardiac Rehab from 12/16/2019 in [facility] Cardiac and Pulmonary Rehab  Referring Provider Herold Harms, MD ([place])      Encounter Date: 01/15/2020  Check In:  Session Check In - 01/15/20 1117      Check-In   Supervising physician immediately available to respond to emergencies See telemetry face sheet for immediately available ER MD    Location [facility]-Cardiac & Pulmonary Rehab    Staff Present Renita Papa, RN BSN;Joseph [address] [place], [place], RCEP, CCRP, CCET    Virtual Visit No    Medication changes reported     No    Fall or balance concerns reported    No    Warm-up and Cool-down Performed on first and last piece of equipment    Resistance Training Performed Yes    VAD Patient? No    PAD/SET Patient? No      Pain Assessment   Currently in Pain? No/denies              Social History   Tobacco Use  Smoking Status Former Smoker  . Packs/day: 1.00  . Years: 20.00  . Pack years: 20.00  . Types: Cigarettes  . Quit date: 02/26/1993  . Years since quitting: 26.9  Smokeless Tobacco Never Used  Tobacco Comment   QUIT SMOKING 20 YEARS AGO    Goals Met:  Independence with exercise equipment Exercise tolerated well No report of cardiac concerns or symptoms Strength training completed today  Goals Unmet:  Not Applicable  Comments: Pt able to follow exercise prescription today without complaint.  Will continue to monitor for progression.    Dr. Emily Filbert is Medical Director for [place] and [facility].

## 2020-01-18 ENCOUNTER — Encounter (HOSPITAL_COMMUNITY): Payer: Self-pay | Admitting: Cardiology

## 2020-01-18 ENCOUNTER — Other Ambulatory Visit: Payer: Self-pay

## 2020-01-18 ENCOUNTER — Encounter (HOSPITAL_COMMUNITY): Payer: Self-pay | Admitting: Cardiovascular Disease

## 2020-01-18 ENCOUNTER — Ambulatory Visit (HOSPITAL_COMMUNITY): Payer: Federal, State, Local not specified - PPO | Attending: Internal Medicine

## 2020-01-18 DIAGNOSIS — Z951 Presence of aortocoronary bypass graft: Secondary | ICD-10-CM

## 2020-01-18 NOTE — Progress Notes (Unsigned)
Patient ID: Cody Arellano, male   DOB: 05-26-1946, 74 y.o.   MRN: [id number]   Verified appointment "no show" status with Elmo Putt at 8:24OR.

## 2020-01-18 NOTE — Progress Notes (Signed)
Completed initial RD evaluation 

## 2020-01-20 ENCOUNTER — Other Ambulatory Visit: Payer: Self-pay

## 2020-01-20 ENCOUNTER — Encounter: Payer: No Typology Code available for payment source | Admitting: *Deleted

## 2020-01-20 DIAGNOSIS — Z951 Presence of aortocoronary bypass graft: Secondary | ICD-10-CM | POA: Diagnosis not present

## 2020-01-20 NOTE — Progress Notes (Signed)
Daily Session Note  Patient Details  Name: Cody Arellano MRN: [id number] Date of Birth: May 10, 1946 Referring Provider:     Cardiac Rehab from 12/16/2019 in [facility] Cardiac and Pulmonary Rehab  Referring Provider Herold Harms, MD ([place])      Encounter Date: 01/20/2020  Check In:  Session Check In - 01/20/20 1130      Check-In   Supervising physician immediately available to respond to emergencies See telemetry face sheet for immediately available ER MD    Location [facility]-Cardiac & Pulmonary Rehab    Staff Present Heath Lark, RN, BSN, CCRP;Jessica [place], MA, RCEP, CCRP, CCET;Joseph [employer], [place], ACSM CEP, Exercise Physiologist    Virtual Visit No    Medication changes reported     No    Fall or balance concerns reported    No    Warm-up and Cool-down Performed on first and last piece of equipment    Resistance Training Performed Yes    VAD Patient? No    PAD/SET Patient? No      Pain Assessment   Currently in Pain? No/denies              Social History   Tobacco Use  Smoking Status Former Smoker  . Packs/day: 1.00  . Years: 20.00  . Pack years: 20.00  . Types: Cigarettes  . Quit date: 02/26/1993  . Years since quitting: 26.9  Smokeless Tobacco Never Used  Tobacco Comment   QUIT SMOKING 20 YEARS AGO    Goals Met:  Independence with exercise equipment Exercise tolerated well No report of cardiac concerns or symptoms  Goals Unmet:  Not Applicable  Comments: Pt able to follow exercise prescription today without complaint.  Will continue to monitor for progression.    Dr. Emily Filbert is Medical Director for [place] and [facility].

## 2020-01-22 ENCOUNTER — Encounter: Payer: Federal, State, Local not specified - PPO | Admitting: Internal Medicine

## 2020-01-22 ENCOUNTER — Encounter: Payer: No Typology Code available for payment source | Admitting: *Deleted

## 2020-01-22 ENCOUNTER — Other Ambulatory Visit: Payer: Self-pay

## 2020-01-22 DIAGNOSIS — Z951 Presence of aortocoronary bypass graft: Secondary | ICD-10-CM | POA: Diagnosis not present

## 2020-01-22 NOTE — Progress Notes (Signed)
Daily Session Note  Patient Details  Name: Cody Arellano MRN: [id number] Date of Birth: 03/20/1946 Referring Provider:     Cardiac Rehab from 12/16/2019 in [facility] Cardiac and Pulmonary Rehab  Referring Provider Herold Harms, MD ([place])      Encounter Date: 01/22/2020  Check In:  Session Check In - 01/22/20 1113      Check-In   Supervising physician immediately available to respond to emergencies See telemetry face sheet for immediately available ER MD    Location [facility]-Cardiac & Pulmonary Rehab    Staff Present Heath Lark, RN, BSN, CCRP;Melissa [place]atah RDN, LDN;Joseph Hood [place], [place], [place], [place], CCET    Virtual Visit No    Medication changes reported     No    Fall or balance concerns reported    No    Warm-up and Cool-down Performed on first and last piece of equipment    Resistance Training Performed Yes    VAD Patient? No    PAD/SET Patient? No      Pain Assessment   Currently in Pain? No/denies              Social History   Tobacco Use  Smoking Status Former Smoker  . Packs/day: 1.00  . Years: 20.00  . Pack years: 20.00  . Types: Cigarettes  . Quit date: 02/26/1993  . Years since quitting: 26.9  Smokeless Tobacco Never Used  Tobacco Comment   QUIT SMOKING 20 YEARS AGO    Goals Met:  Independence with exercise equipment Exercise tolerated well No report of cardiac concerns or symptoms  Goals Unmet:  Not Applicable  Comments: Pt able to follow exercise prescription today without complaint.  Will continue to monitor for progression.    Dr. Emily Filbert is Medical Director for [place] and [facility].

## 2020-01-25 ENCOUNTER — Encounter: Payer: No Typology Code available for payment source | Admitting: *Deleted

## 2020-01-25 ENCOUNTER — Ambulatory Visit: Payer: Federal, State, Local not specified - PPO | Admitting: Cardiovascular Disease

## 2020-01-25 ENCOUNTER — Other Ambulatory Visit: Payer: Self-pay

## 2020-01-25 DIAGNOSIS — Z951 Presence of aortocoronary bypass graft: Secondary | ICD-10-CM | POA: Diagnosis not present

## 2020-01-25 NOTE — Progress Notes (Signed)
Daily Session Note  Patient Details  Name: Cody Arellano MRN: [id number] Date of Birth: July 10, 1945 Referring Provider:     Cardiac Rehab from 12/16/2019 in [facility] Cardiac and Pulmonary Rehab  Referring Provider Herold Harms, MD ([place])      Encounter Date: 01/25/2020  Check In:  Session Check In - 01/25/20 1117      Check-In   Supervising physician immediately available to respond to emergencies See telemetry face sheet for immediately available ER MD    Location [facility]-Cardiac & Pulmonary Rehab    Staff Present Heath Lark, RN, BSN, CCRP;Jessica Emerson, MA, RCEP, CCRP, [place], BS, ACSM CEP, Exercise Physiologist    Virtual Visit No    Medication changes reported     No    Fall or balance concerns reported    No    Warm-up and Cool-down Performed on first and last piece of equipment    Resistance Training Performed Yes    VAD Patient? No    PAD/SET Patient? No      Pain Assessment   Currently in Pain? No/denies              Social History   Tobacco Use  Smoking Status Former Smoker  . Packs/day: 1.00  . Years: 20.00  . Pack years: 20.00  . Types: Cigarettes  . Quit date: 02/26/1993  . Years since quitting: 26.9  Smokeless Tobacco Never Used  Tobacco Comment   QUIT SMOKING 20 YEARS AGO    Goals Met:  Independence with exercise equipment Exercise tolerated well No report of cardiac concerns or symptoms  Goals Unmet:  Not Applicable  Comments: Pt able to follow exercise prescription today without complaint.  Will continue to monitor for progression.    Dr. Emily Filbert is Medical Director for [place] and [facility].

## 2020-01-28 ENCOUNTER — Inpatient Hospital Stay (HOSPITAL_COMMUNITY)
Admission: EM | Admit: 2020-01-28 | Discharge: 2020-02-02 | DRG: 853 | Disposition: A | Discharge status: Home Health | Payer: Medicare Other | Attending: Internal Medicine | Admitting: Internal Medicine

## 2020-01-28 ENCOUNTER — Other Ambulatory Visit: Payer: Self-pay

## 2020-01-28 ENCOUNTER — Encounter (HOSPITAL_COMMUNITY): Payer: Self-pay

## 2020-01-28 ENCOUNTER — Telehealth (HOSPITAL_COMMUNITY): Payer: Self-pay | Admitting: Cardiovascular Disease

## 2020-01-28 ENCOUNTER — Emergency Department (HOSPITAL_COMMUNITY): Payer: Medicare Other

## 2020-01-28 DIAGNOSIS — G44229 Chronic tension-type headache, not intractable: Secondary | ICD-10-CM | POA: Diagnosis present

## 2020-01-28 DIAGNOSIS — Z87891 Personal history of nicotine dependence: Secondary | ICD-10-CM

## 2020-01-28 DIAGNOSIS — Z7982 Long term (current) use of aspirin: Secondary | ICD-10-CM | POA: Diagnosis not present

## 2020-01-28 DIAGNOSIS — N400 Enlarged prostate without lower urinary tract symptoms: Secondary | ICD-10-CM

## 2020-01-28 DIAGNOSIS — Z955 Presence of coronary angioplasty implant and graft: Secondary | ICD-10-CM

## 2020-01-28 DIAGNOSIS — Z79899 Other long term (current) drug therapy: Secondary | ICD-10-CM | POA: Diagnosis not present

## 2020-01-28 DIAGNOSIS — M3213 Lung involvement in systemic lupus erythematosus: Secondary | ICD-10-CM | POA: Diagnosis present

## 2020-01-28 DIAGNOSIS — J9811 Atelectasis: Secondary | ICD-10-CM | POA: Diagnosis not present

## 2020-01-28 DIAGNOSIS — I951 Orthostatic hypotension: Secondary | ICD-10-CM | POA: Diagnosis not present

## 2020-01-28 DIAGNOSIS — Z7901 Long term (current) use of anticoagulants: Secondary | ICD-10-CM

## 2020-01-28 DIAGNOSIS — K5900 Constipation, unspecified: Secondary | ICD-10-CM | POA: Diagnosis present

## 2020-01-28 DIAGNOSIS — E785 Hyperlipidemia, unspecified: Secondary | ICD-10-CM | POA: Diagnosis present

## 2020-01-28 DIAGNOSIS — I959 Hypotension, unspecified: Secondary | ICD-10-CM | POA: Diagnosis present

## 2020-01-28 DIAGNOSIS — A4151 Sepsis due to Escherichia coli [E. coli]: Secondary | ICD-10-CM | POA: Diagnosis present

## 2020-01-28 DIAGNOSIS — K6131 Horseshoe abscess: Secondary | ICD-10-CM | POA: Diagnosis present

## 2020-01-28 DIAGNOSIS — D500 Iron deficiency anemia secondary to blood loss (chronic): Secondary | ICD-10-CM | POA: Diagnosis not present

## 2020-01-28 DIAGNOSIS — K611 Rectal abscess: Secondary | ICD-10-CM | POA: Diagnosis present

## 2020-01-28 DIAGNOSIS — Z8249 Family history of ischemic heart disease and other diseases of the circulatory system: Secondary | ICD-10-CM

## 2020-01-28 DIAGNOSIS — K869 Disease of pancreas, unspecified: Secondary | ICD-10-CM | POA: Diagnosis not present

## 2020-01-28 DIAGNOSIS — E861 Hypovolemia: Secondary | ICD-10-CM | POA: Diagnosis present

## 2020-01-28 DIAGNOSIS — E7800 Pure hypercholesterolemia, unspecified: Secondary | ICD-10-CM | POA: Diagnosis not present

## 2020-01-28 DIAGNOSIS — Z20822 Contact with and (suspected) exposure to covid-19: Secondary | ICD-10-CM | POA: Diagnosis present

## 2020-01-28 DIAGNOSIS — I2510 Atherosclerotic heart disease of native coronary artery without angina pectoris: Secondary | ICD-10-CM | POA: Diagnosis present

## 2020-01-28 DIAGNOSIS — F329 Major depressive disorder, single episode, unspecified: Secondary | ICD-10-CM | POA: Diagnosis present

## 2020-01-28 DIAGNOSIS — I252 Old myocardial infarction: Secondary | ICD-10-CM

## 2020-01-28 DIAGNOSIS — I480 Paroxysmal atrial fibrillation: Secondary | ICD-10-CM | POA: Diagnosis present

## 2020-01-28 DIAGNOSIS — J9601 Acute respiratory failure with hypoxia: Secondary | ICD-10-CM | POA: Diagnosis not present

## 2020-01-28 DIAGNOSIS — I5042 Chronic combined systolic (congestive) and diastolic (congestive) heart failure: Secondary | ICD-10-CM | POA: Diagnosis present

## 2020-01-28 DIAGNOSIS — D509 Iron deficiency anemia, unspecified: Secondary | ICD-10-CM | POA: Diagnosis present

## 2020-01-28 DIAGNOSIS — Z951 Presence of aortocoronary bypass graft: Secondary | ICD-10-CM

## 2020-01-28 DIAGNOSIS — I110 Hypertensive heart disease with heart failure: Secondary | ICD-10-CM | POA: Diagnosis present

## 2020-01-28 DIAGNOSIS — Z83438 Family history of other disorder of lipoprotein metabolism and other lipidemia: Secondary | ICD-10-CM

## 2020-01-28 DIAGNOSIS — D649 Anemia, unspecified: Secondary | ICD-10-CM | POA: Diagnosis not present

## 2020-01-28 DIAGNOSIS — F419 Anxiety disorder, unspecified: Secondary | ICD-10-CM | POA: Diagnosis present

## 2020-01-28 LAB — COMPREHENSIVE METABOLIC PANEL
ALT: 20 U/L (ref 0–44)
AST: 17 U/L (ref 15–41)
Albumin: 2.2 g/dL — ABNORMAL LOW (ref 3.5–5.0)
Alkaline Phosphatase: 44 U/L (ref 38–126)
Anion gap: 10 (ref 5–15)
BUN: 10 mg/dL (ref 8–23)
CO2: 22 mmol/L (ref 22–32)
Calcium: 8.5 mg/dL — ABNORMAL LOW (ref 8.9–10.3)
Chloride: 103 mmol/L (ref 98–111)
Creatinine, Ser: 0.99 mg/dL (ref 0.61–1.24)
GFR calc Af Amer: >60 mL/min (ref >60)
GFR calc non Af Amer: >60 mL/min (ref >60)
Glucose, Bld: 129 mg/dL — ABNORMAL HIGH (ref 70–99)
Potassium: 4.5 mmol/L (ref 3.5–5.1)
Sodium: 135 mmol/L (ref 135–145)
Total Bilirubin: 0.3 mg/dL (ref 0.3–1.2)
Total Protein: 5.9 g/dL — ABNORMAL LOW (ref 6.5–8.1)

## 2020-01-28 LAB — CBC WITH DIFFERENTIAL/PLATELET
Abs Immature Granulocytes: 0.13 10*3/uL — ABNORMAL HIGH (ref 0.00–0.07)
Basophils Absolute: 0 10*3/uL (ref 0.0–0.1)
Basophils Relative: 0 %
Eosinophils Absolute: 0.1 10*3/uL (ref 0.0–0.5)
Eosinophils Relative: 1 %
HCT: 33.9 % — ABNORMAL LOW (ref 39.0–52.0)
Hemoglobin: 10.1 g/dL — ABNORMAL LOW (ref 13.0–17.0)
Immature Granulocytes: 1 %
Lymphocytes Relative: 4 %
Lymphs Abs: 0.6 10*3/uL — ABNORMAL LOW (ref 0.7–4.0)
MCH: 27.9 pg (ref 26.0–34.0)
MCHC: 29.8 g/dL — ABNORMAL LOW (ref 30.0–36.0)
MCV: 93.6 fL (ref 80.0–100.0)
Monocytes Absolute: 0.9 10*3/uL (ref 0.1–1.0)
Monocytes Relative: 5 %
Neutro Abs: 14.9 10*3/uL — ABNORMAL HIGH (ref 1.7–7.7)
Neutrophils Relative %: 89 %
Platelets: 335 10*3/uL (ref 150–400)
RBC: 3.62 MIL/uL — ABNORMAL LOW (ref 4.22–5.81)
RDW: 17.2 % — ABNORMAL HIGH (ref 11.5–15.5)
WBC: 16.6 10*3/uL — ABNORMAL HIGH (ref 4.0–10.5)
nRBC: 0 % (ref 0.0–0.2)

## 2020-01-28 LAB — POC OCCULT BLOOD, ED: Fecal Occult Bld: NEGATIVE

## 2020-01-28 MED ORDER — HYDROMORPHONE HCL 1 MG/ML IJ SOLN
0.5000 mg | INTRAMUSCULAR | Status: DC | PRN
  Administered 2020-01-29 – 2020-01-31 (×5): 0.5 mg via INTRAVENOUS
  Filled 2020-01-28 (×3): qty 0.5
  Filled 2020-01-28: qty 1
  Filled 2020-01-28: qty 0.5

## 2020-01-28 MED ORDER — METRONIDAZOLE IN NACL 5-0.79 MG/ML-% IV SOLN
500.0000 mg | Freq: Three times a day (TID) | INTRAVENOUS | Status: DC
  Administered 2020-01-29 – 2020-02-01 (×9): 500 mg via INTRAVENOUS
  Filled 2020-01-28 (×9): qty 100

## 2020-01-28 MED ORDER — METRONIDAZOLE IN NACL 5-0.79 MG/ML-% IV SOLN
500.0000 mg | Freq: Once | INTRAVENOUS | Status: AC
  Administered 2020-01-29: 500 mg via INTRAVENOUS
  Filled 2020-01-28: qty 100

## 2020-01-28 MED ORDER — IOHEXOL 300 MG/ML  SOLN
100.0000 mL | Freq: Once | INTRAMUSCULAR | Status: AC | PRN
  Administered 2020-01-28: 100 mL via INTRAVENOUS

## 2020-01-28 MED ORDER — SODIUM CHLORIDE 0.9 % IV BOLUS
1000.0000 mL | Freq: Once | INTRAVENOUS | Status: AC
  Administered 2020-01-28: 1000 mL via INTRAVENOUS

## 2020-01-28 MED ORDER — SODIUM CHLORIDE 0.9 % IV SOLN
1.0000 g | INTRAVENOUS | Status: DC
  Administered 2020-01-28: 1 g via INTRAVENOUS
  Filled 2020-01-28: qty 10

## 2020-01-28 MED ORDER — ONDANSETRON HCL 4 MG/2ML IJ SOLN
4.0000 mg | Freq: Once | INTRAMUSCULAR | Status: AC
  Administered 2020-01-28: 4 mg via INTRAVENOUS
  Filled 2020-01-28: qty 2

## 2020-01-28 MED ORDER — HYDROMORPHONE HCL 1 MG/ML IJ SOLN
1.0000 mg | INTRAMUSCULAR | Status: DC | PRN
  Administered 2020-01-28: 1 mg via INTRAVENOUS
  Filled 2020-01-28: qty 1

## 2020-01-28 NOTE — ED Notes (Signed)
Non slip socks provided and assisted to bedside commode, call bell in reach pt provided verbalized understanding of use of call bell.

## 2020-01-28 NOTE — ED Provider Notes (Signed)
[facility] EMERGENCY DEPARTMENT Provider Note   CSN: [id number] Arrival date & time: 01/28/20  0277     History Chief Complaint  Patient presents with  . Rectal Pain    Cody Arellano is a 74 y.o. male.  Pt reports he has severe pain in his rectum.  Pt reports he felt constipated and took a laxative.  Pt reports he has been having diarrhea now. Pt complains of weakness.    The history is provided by the patient. No language interpreter was used.  Abdominal Pain Pain location: rectal. Pain quality: aching   Pain radiates to:  Does not radiate Pain severity:  Severe Onset quality:  Gradual Timing:  Constant Progression:  Worsening Chronicity:  New Relieved by:  Nothing Worsened by:  Nothing Ineffective treatments:  None tried Associated symptoms: constipation and nausea   Risk factors: no alcohol abuse        Past Medical History:  Diagnosis Date  . Anxiety   . Chronic tension headache   . Colon polyps   . Coronary artery disease 2008/2009   MI with PCI x 2, then PCI x 1 in 2009  . Depression   . Dyslipidemia   . Dysrhythmia    atrial fibrillation  . Hyperlipidemia   . Hypertension   . Lupus [facility])    sees Dr Trudie Reed  . Lupus disease of the lung   . 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 Myocardial infarction ([place]) 2008  . 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 Shortness of breath     Patient Active Problem List   Diagnosis Date Noted  . STEMI (ST elevation myocardial infarction) ([place]) 10/17/2019  . STEMI involving left anterior descending coronary artery ([place]) 10/17/2019  . 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 Paroxysmal atrial fibrillation ([place]) 01/20/2019  . Chronic tension headache   . Preventative health care 01/09/2018  . Memory loss 01/09/2018  . Advance directive discussed with patient 01/09/2018  . Hyperlipemia 01/17/2016  . 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 Personal history of colonic polyps 05/20/2013  . Episodic mood disorder ([place]) 05/13/2013  . Systemic lupus erythematosus ([place]) 05/13/2013  . Colon polyps   . CAD (coronary artery disease) 04/09/2013  . 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 ILD (interstitial lung disease) ([place]) 02/04/2013    Past Surgical History:  Procedure Laterality Date  . CARDIAC CATHETERIZATION    . CLIPPING OF ATRIAL APPENDAGE N/A 10/17/2019   Procedure: Clipping Of Atrial Appendage using AtriCure [id number] 45 MM AtriClip.;  Surgeon: Rexene Alberts, MD;  Location: [place];  Service: Open Heart Surgery;  Laterality: N/A;  . CORONARY ARTERY BYPASS GRAFT N/A 10/17/2019   Procedure: CORONARY ARTERY BYPASS GRAFTING (CABG) using LIMA to LAD; Endoscopic harvest right greater saphenous vein: SVG to RAMUS.;  Surgeon: Rexene Alberts, MD;  Location: [place];  Service: Open Heart Surgery;  Laterality: N/A;  . CORONARY BALLOON ANGIOPLASTY N/A 10/17/2019   Procedure: CORONARY BALLOON ANGIOPLASTY;  Surgeon: Nelva Bush, MD;  Location: [place] CV LAB;  Service: Cardiovascular;  Laterality: N/A;  . coronary stents  2009  . CORONARY/GRAFT ACUTE MI REVASCULARIZATION N/A 10/17/2019   Procedure: Coronary/Graft Acute MI Revascularization;  Surgeon: Nelva Bush, MD;  Location: [place] CV LAB;  Service: Cardiovascular;  Laterality: N/A;  . ENDOVEIN HARVEST OF GREATER SAPHENOUS  VEIN Right 10/17/2019   Procedure: [place] Of Greater Saphenous Vein;  Surgeon: Rexene Alberts, MD;  Location: [place];  Service: Open Heart Surgery;  Laterality: Right;  . IABP INSERTION N/A 10/17/2019   Procedure: IABP Insertion;  Surgeon: Nelva Bush, MD;  Location: [place] [facility];  Service: Cardiovascular;  Laterality: N/A;  . IR THORACENTESIS ASP PLEURAL SPACE W/IMG GUIDE  10/26/2019  . RIGHT/LEFT HEART CATH AND CORONARY ANGIOGRAPHY N/A 10/17/2019   Procedure: RIGHT/LEFT HEART CATH  AND CORONARY ANGIOGRAPHY;  Surgeon: Nelva Bush, MD;  Location: [place] [facility];  Service: Cardiovascular;  Laterality: N/A;  . TEE WITHOUT CARDIOVERSION  10/17/2019   Procedure: Transesophageal Echocardiogram ([place]);  Surgeon: Rexene Alberts, MD;  Location[facility];  Service: Open Heart Surgery;;       Family History  Problem Relation Age of Onset  . Heart disease Mother   . Alcohol abuse Father   . Hyperlipidemia Sister   . Hypertension Sister   . Hyperlipidemia Brother   . Hypertension Brother   . Cancer Brother        ?lung cancer  . Stomach cancer Brother   . Diabetes Paternal Uncle   . Colon cancer Neg Hx   . Esophageal cancer Neg Hx   . Rectal cancer Neg Hx     Social History   Tobacco Use  . Smoking status: Former Smoker    Packs/day: 1.00    Years: 20.00    Pack years: 20.00    Types: Cigarettes    Quit date: 02/26/1993    Years since quitting: 26.9  . Smokeless tobacco: Never Used  . Tobacco comment: QUIT SMOKING 20 YEARS AGO  Vaping Use  . Vaping Use: Never used  Substance Use Topics  . Alcohol use: No  . Drug use: No    Home Medications Prior to Admission medications   Medication Sig Start Date End Date Taking? Authorizing Provider  amiodarone (PACERONE) 200 MG tablet Please take 1 tab (200mg ) twice a day for 2 weeks and then 1 tab (200mg ) daily until we see you in follow-up. Patient taking differently: Take 200 mg by mouth every evening.  10/27/19   Elgie Collard, PA-C  apixaban (ELIQUIS) 2.5 MG TABS tablet Take 1 tablet (2.5 mg total) by mouth 2 (two) times daily. 10/27/19   Gold, Wilder Glade, PA-C  aspirin EC 81 MG tablet Take 1 tablet (81 mg total) by mouth daily. 08/10/14   Nahser, Wonda Cheng, MD  azaTHIOprine (IMURAN) 50 MG tablet Take 50 mg by mouth in the morning and at bedtime.  10/11/14   [provider]  hydroxychloroquine (PLAQUENIL) 200 MG tablet Take 400 mg by mouth every evening.     [provider]  losartan (COZAAR) 25 MG  tablet Take 0.5 tablets (12.5 mg total) by mouth daily. 11/16/19   Elgie Collard, PA-C  oxyCODONE (OXY IR/ROXICODONE) 5 MG immediate release tablet Take 1 tablet (5 mg total) by mouth every 6 (six) hours as needed for severe pain. 10/27/19   Elgie Collard, PA-C  rosuvastatin (CRESTOR) 40 MG tablet Take 1 tablet (40 mg total) by mouth daily. 10/28/19   Elgie Collard, PA-C  sertraline (ZOLOFT) 50 MG tablet Take 50 mg by mouth daily. Patient not taking: Reported on 11/16/2019    [provider]  spironolactone (ALDACTONE) 25 MG tablet Take 0.5 tablets (12.5 mg total) by mouth daily. 10/27/19   Elgie Collard, PA-C  tamsulosin (FLOMAX) 0.4  MG CAPS capsule Take 0.4 mg by mouth every evening.  04/02/17   [provider]    Allergies    Patient has no known allergies.  Review of Systems   Review of Systems  Gastrointestinal: Positive for abdominal pain, constipation and nausea.  All other systems reviewed and are negative.   Physical Exam Updated Vital Signs BP (!) 98/50   Pulse 72   Temp 98.4 F (36.9 C) (Oral)   Resp 17   SpO2 95%   Physical Exam Vitals reviewed.  HENT:     Head: Normocephalic.     Right Ear: Tympanic membrane normal.     Mouth/Throat:     Mouth: Mucous membranes are moist.  Eyes:     Pupils: Pupils are equal, round, and reactive to light.  Cardiovascular:     Rate and Rhythm: Normal rate and regular rhythm.  Pulmonary:     Effort: Pulmonary effort is normal.  Abdominal:     General: Abdomen is flat.  Musculoskeletal:        General: Normal range of motion.     Cervical back: Normal range of motion.  Skin:    General: Skin is warm.  Neurological:     General: No focal deficit present.     Mental Status: He is alert.  Psychiatric:        Mood and Affect: Mood normal.     ED Results / Procedures / Treatments   Labs (all labs ordered are listed, but only abnormal results are displayed) Labs Reviewed  CBC WITH DIFFERENTIAL/PLATELET -  Abnormal; Notable for the following components:      Result Value   WBC 16.6 (*)    RBC 3.62 (*)    Hemoglobin 10.1 (*)    HCT 33.9 (*)    MCHC 29.8 (*)    RDW 17.2 (*)    Neutro Abs 14.9 (*)    Lymphs Abs 0.6 (*)    Abs Immature Granulocytes 0.13 (*)    All other components within normal limits  COMPREHENSIVE METABOLIC PANEL - Abnormal; Notable for the following components:   Glucose, Bld 129 (*)    Calcium 8.5 (*)    Total Protein 5.9 (*)    Albumin 2.2 (*)    All other components within normal limits  SARS CORONAVIRUS 2 BY RT PCR (HOSPITAL ORDER, [place] [facility])  POC OCCULT BLOOD, ED    EKG None  Radiology CT ABDOMEN PELVIS W CONTRAST  Result Date: 01/28/2020 CLINICAL DATA:  Rectal pain and swelling for 1 week with dark stools, initial encounter EXAM: CT ABDOMEN AND PELVIS WITH CONTRAST TECHNIQUE: Multidetector CT imaging of the abdomen and pelvis was performed using the standard protocol following bolus administration of intravenous contrast. CONTRAST:  114mL OMNIPAQUE IOHEXOL 300 MG/ML  SOLN COMPARISON:  11/13/2019 FINDINGS: Lower chest: Emphysematous changes are noted in the lung bases with multiple bulla and blebs. Some associated nodular densities are noted stable in appearance from the prior exam. Hepatobiliary: No focal liver abnormality is seen. No gallstones, gallbladder wall thickening, or biliary dilatation. Pancreas: Pancreas is well visualized. There is a vague hypodensity identified in the body of the pancreas near the junction with the head suspicious for focal mass. No ductal dilatation is identified. The remainder of the pancreas is within normal limits. Spleen: Normal in size without focal abnormality. Adrenals/Urinary Tract: Adrenal glands are within normal limits. Kidneys demonstrate a normal enhancement pattern bilaterally. Exophytic cyst is noted from the right  kidney laterally. No obstructive changes are seen. The bladder is partially  distended. Along the inferior margin of the bladder there is significant in growth of the prostate. Possibility of prostate mass lesion could not be totally excluded. Stomach/Bowel: The rectum is decompressed. Surrounding the rectum, there are multiple peripherally enhancing fluid attenuation areas the largest of which is noted to the right of the rectum measuring approximately 6.6 x 3.7 cm. These encircle the rectum and extends superiorly adjacent to the prostate as well as the inferior aspect of the bladder posteriorly. The more proximal colon shows fecal material within although no obstructive changes are seen. The appendix is unremarkable. Small bowel and stomach appear within normal limits. Vascular/Lymphatic: Aortic calcifications are noted without aneurysmal dilatation or dissection. No sizable lymphadenopathy is noted. Reproductive: Prostate is enlarged in size with ingrowth into the inferior aspect of the bladder. The possibility of a prostate mass could not be totally excluded. Direct visualization is recommended when the patient's condition improves. The seminal vesicles are involved with the inflammatory change surrounding the rectum. Other: No free fluid is noted.  No hernia is seen. Musculoskeletal: No acute or significant osseous findings. IMPRESSION: Multifocal peripherally enhancing fluid collections consistent with abscesses surrounding the rectum. The largest of this area measures approximately 6.6 x 3.7 cm. Multiple smaller components are noted. This extends superiorly towards the seminal vesicle on the right as well as the inferior wall of the bladder and right lateral aspect of the prostate. Prominent prostate with ingrowth into the urinary bladder. This is likely related to prosthetic hypertrophy although the possibility of a prostate mass could not be totally excluded. Direct visualization is recommended when the patient's condition improves. Vague hypodensity in the body of the pancreas as  described. The possibility of an underlying mass deserves consideration. This can be evaluated on non emergent outpatient MRI to allow for optimum imaging when the patient's condition improves. Electronically Signed   By: Inez Catalina M.D.   On: 01/28/2020 21:56    Procedures Procedures (including critical care time)  Medications Ordered in ED Medications  iohexol (OMNIPAQUE) 300 MG/ML solution 100 mL (100 mLs Intravenous Contrast Given 01/28/20 2144)    ED Course  I have reviewed the triage vital signs and the nursing notes.  Pertinent labs & imaging results that were available during my care of the patient were reviewed by me and considered in my medical decision making (see chart for details).    MDM Rules/Calculators/A&P                          MDM: Ct scan shows a rectal abscess.  I spoke to Dr. Grandville Silos who will see and consult.  He advised npo after midnight.  He will try to take pt to surgery in the am.  He request hospitalist to admit  Final Clinical Impression(s) / ED Diagnoses Final diagnoses:  Rectal abscess    Rx / DC Orders ED Discharge Orders    None       Sidney Ace 01/28/20 2313    Truddie Hidden, MD 01/29/20 1525

## 2020-01-28 NOTE — H&P (View-Only) (Signed)
Reason for Consult:perirectal abscesses Referring Physician: Alyse Low, PA-C  Cody Arellano is an 74 y.o. male.  HPI: 74 year old male status post CABG x2 in May of this year presents to the emergency department with 1 week history of perirectal pain.  He has been using stool softeners and Preparation H without any relief.  He came to the emergency department and was evaluated further with laboratory studies showing leukocytosis of 16,600.  Additionally, he underwent CT scan of the abdomen pelvis which shows bilateral perirectal abscesses, larger on the right side.  He takes Eliquis but has not had a dose today because he has been waiting in the emergency department.  Past Medical History:  Diagnosis Date  . Anxiety   . Chronic tension headache   . Colon polyps   . Coronary artery disease 2008/2009   MI with PCI x 2, then PCI x 1 in 2009  . Depression   . Dyslipidemia   . Dysrhythmia    atrial fibrillation  . Hyperlipidemia   . Hypertension   . Lupus [facility])    sees Dr Trudie Reed  . Lupus disease of the lung   . 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 Myocardial infarction ([place]) 2008  . 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 Shortness of breath     Past Surgical History:  Procedure Laterality Date  . CARDIAC CATHETERIZATION    . CLIPPING OF ATRIAL APPENDAGE N/A 10/17/2019   Procedure: Clipping Of Atrial Appendage using AtriCure [id number] 45 MM AtriClip.;  Surgeon: Rexene Alberts, MD;  Location: [place];  Service: Open Heart Surgery;  Laterality: N/A;  . CORONARY ARTERY BYPASS GRAFT N/A 10/17/2019   Procedure: CORONARY ARTERY BYPASS GRAFTING (CABG) using LIMA to LAD; Endoscopic harvest right greater saphenous vein: SVG to RAMUS.;  Surgeon: Rexene Alberts, MD;  Location: [place];  Service: Open Heart Surgery;  Laterality: N/A;  . CORONARY BALLOON ANGIOPLASTY N/A 10/17/2019   Procedure: CORONARY BALLOON ANGIOPLASTY;  Surgeon: Nelva Bush,  MD;  Location: [place] [facility];  Service: Cardiovascular;  Laterality: N/A;  . coronary stents  2009  . CORONARY/GRAFT ACUTE MI REVASCULARIZATION N/A 10/17/2019   Procedure: Coronary/Graft Acute MI Revascularization;  Surgeon: Nelva Bush, MD;  Location: [place] CV LAB;  Service: Cardiovascular;  Laterality: N/A;  . ENDOVEIN HARVEST OF GREATER SAPHENOUS VEIN Right 10/17/2019   Procedure: [place] Of Greater Saphenous Vein;  Surgeon: Rexene Alberts, MD;  Location: [place];  Service: Open Heart Surgery;  Laterality: Right;  . IABP INSERTION N/A 10/17/2019   Procedure: IABP Insertion;  Surgeon: Nelva Bush, MD;  Location: [place] [facility];  Service: Cardiovascular;  Laterality: N/A;  . IR THORACENTESIS ASP PLEURAL SPACE W/IMG GUIDE  10/26/2019  . RIGHT/LEFT HEART CATH AND CORONARY ANGIOGRAPHY N/A 10/17/2019   Procedure: RIGHT/LEFT HEART CATH AND CORONARY ANGIOGRAPHY;  Surgeon: Nelva Bush, MD;  Location: [place] [facility];  Service: Cardiovascular;  Laterality: N/A;  . TEE WITHOUT CARDIOVERSION  10/17/2019   Procedure: Transesophageal Echocardiogram (Tee);  Surgeon: Rexene Alberts, MD;  Location: [facility] OR;  Service: Open Heart Surgery;;    Family History  Problem Relation Age of Onset  . Heart disease Mother   . Alcohol abuse Father   . Hyperlipidemia Sister   . Hypertension Sister   . Hyperlipidemia Brother   . Hypertension Brother   . Cancer Brother        ?  lung cancer  . Stomach cancer Brother   . Diabetes Paternal Uncle   . Colon cancer Neg Hx   . Esophageal cancer Neg Hx   . Rectal cancer Neg Hx     Social History:  reports that he quit smoking about 26 years ago. His smoking use included cigarettes. He has a 20.00 pack-year smoking history. He has never used smokeless tobacco. He reports that he does not drink alcohol and does not use drugs.  Allergies: No Known Allergies  Medications: I have reviewed the patient's current medications.  Results for  orders placed or performed during the hospital encounter of 01/28/20 (from the past 48 hour(s))  CBC with Differential     Status: Abnormal   Collection Time: 01/28/20  7:49 AM  Result Value Ref Range   WBC 16.6 (H) 4.0 - 10.5 K/uL   RBC 3.62 (L) 4.22 - 5.81 MIL/uL   Hemoglobin 10.1 (L) 13.0 - 17.0 g/dL   HCT 33.9 (L) 39 - 52 %   MCV 93.6 80.0 - 100.0 fL   MCH 27.9 26.0 - 34.0 pg   MCHC 29.8 (L) 30.0 - 36.0 g/dL   RDW 17.2 (H) 11.5 - 15.5 %   Platelets 335 150 - 400 K/uL   nRBC 0.0 0.0 - 0.2 %   Neutrophils Relative % 89 %   Neutro Abs 14.9 (H) 1.7 - 7.7 K/uL   Lymphocytes Relative 4 %   Lymphs Abs 0.6 (L) 0.7 - 4.0 K/uL   Monocytes Relative 5 %   Monocytes Absolute 0.9 0 - 1 K/uL   Eosinophils Relative 1 %   Eosinophils Absolute 0.1 0 - 0 K/uL   Basophils Relative 0 %   Basophils Absolute 0.0 0 - 0 K/uL   Immature Granulocytes 1 %   Abs Immature Granulocytes 0.13 (H) 0.00 - 0.07 K/uL    Comment: Performed at [facility], [address] [place], [place] [address]  Comprehensive metabolic panel     Status: Abnormal   Collection Time: 01/28/20  7:49 AM  Result Value Ref Range   Sodium 135 135 - 145 mmol/L   Potassium 4.5 3.5 - 5.1 mmol/L   Chloride 103 98 - 111 mmol/L   CO2 22 22 - 32 mmol/L   Glucose, Bld 129 (H) 70 - 99 mg/dL    Comment: Glucose reference range applies only to samples taken after fasting for at least 8 hours.   BUN 10 8 - 23 mg/dL   Creatinine, Ser 0.99 0.61 - 1.24 mg/dL   Calcium 8.5 (L) 8.9 - 10.3 mg/dL   Total Protein 5.9 (L) 6.5 - 8.1 g/dL   Albumin 2.2 (L) 3.5 - 5.0 g/dL   AST 17 15 - 41 U/L   ALT 20 0 - 44 U/L   Alkaline Phosphatase 44 38 - 126 U/L   Total Bilirubin 0.3 0.3 - 1.2 mg/dL   GFR calc non Af Amer >60 >60 mL/min   GFR calc Af Amer >60 >60 mL/min   Anion gap 10 5 - 15    Comment: Performed at [facility], [place] [address], [place], [place] [address]  POC occult blood, ED Provider will collect     Status: None   Collection  Time: 01/28/20  7:50 PM  Result Value Ref Range   Fecal Occult Bld NEGATIVE NEGATIVE    CT ABDOMEN PELVIS W CONTRAST  Result Date: 01/28/2020 CLINICAL DATA:  Rectal pain and swelling for 1 week with  dark stools, initial encounter EXAM: CT ABDOMEN AND PELVIS WITH CONTRAST TECHNIQUE: Multidetector CT imaging of the abdomen and pelvis was performed using the standard protocol following bolus administration of intravenous contrast. CONTRAST:  116mL OMNIPAQUE IOHEXOL 300 MG/ML  SOLN COMPARISON:  11/13/2019 FINDINGS: Lower chest: Emphysematous changes are noted in the lung bases with multiple bulla and blebs. Some associated nodular densities are noted stable in appearance from the prior exam. Hepatobiliary: No focal liver abnormality is seen. No gallstones, gallbladder wall thickening, or biliary dilatation. Pancreas: Pancreas is well visualized. There is a vague hypodensity identified in the body of the pancreas near the junction with the head suspicious for focal mass. No ductal dilatation is identified. The remainder of the pancreas is within normal limits. Spleen: Normal in size without focal abnormality. Adrenals/Urinary Tract: Adrenal glands are within normal limits. Kidneys demonstrate a normal enhancement pattern bilaterally. Exophytic cyst is noted from the right kidney laterally. No obstructive changes are seen. The bladder is partially distended. Along the inferior margin of the bladder there is significant in growth of the prostate. Possibility of prostate mass lesion could not be totally excluded. Stomach/Bowel: The rectum is decompressed. Surrounding the rectum, there are multiple peripherally enhancing fluid attenuation areas the largest of which is noted to the right of the rectum measuring approximately 6.6 x 3.7 cm. These encircle the rectum and extends superiorly adjacent to the prostate as well as the inferior aspect of the bladder posteriorly. The more proximal colon shows fecal material  within although no obstructive changes are seen. The appendix is unremarkable. Small bowel and stomach appear within normal limits. Vascular/Lymphatic: Aortic calcifications are noted without aneurysmal dilatation or dissection. No sizable lymphadenopathy is noted. Reproductive: Prostate is enlarged in size with ingrowth into the inferior aspect of the bladder. The possibility of a prostate mass could not be totally excluded. Direct visualization is recommended when the patient's condition improves. The seminal vesicles are involved with the inflammatory change surrounding the rectum. Other: No free fluid is noted.  No hernia is seen. Musculoskeletal: No acute or significant osseous findings. IMPRESSION: Multifocal peripherally enhancing fluid collections consistent with abscesses surrounding the rectum. The largest of this area measures approximately 6.6 x 3.7 cm. Multiple smaller components are noted. This extends superiorly towards the seminal vesicle on the right as well as the inferior wall of the bladder and right lateral aspect of the prostate. Prominent prostate with ingrowth into the urinary bladder. This is likely related to prosthetic hypertrophy although the possibility of a prostate mass could not be totally excluded. Direct visualization is recommended when the patient's condition improves. Vague hypodensity in the body of the pancreas as described. The possibility of an underlying mass deserves consideration. This can be evaluated on non emergent outpatient MRI to allow for optimum imaging when the patient's condition improves. Electronically Signed   By: Inez Catalina M.D.   On: 01/28/2020 21:56    Review of Systems  Constitutional: Negative.   HENT: Negative.   Eyes: Negative.   Respiratory: Negative.   Cardiovascular: Negative for chest pain.  Gastrointestinal: Positive for rectal pain. Negative for vomiting.  Endocrine: Negative.   Genitourinary: Negative.   Musculoskeletal: Negative.    Skin: Negative.   Allergic/Immunologic: Negative.   Neurological: Negative.   Hematological: Negative.   Psychiatric/Behavioral: Negative.    Blood pressure (!) 98/50, pulse 72, temperature 98.4 F (36.9 C), temperature source Oral, resp. rate 17, SpO2 95 %. Physical Exam Constitutional:      General: He  is not in acute distress.    Appearance: Normal appearance.  HENT:     Head: Normocephalic.     Right Ear: External ear normal.     Left Ear: External ear normal.     Nose: Nose normal. No congestion.     Mouth/Throat:     Mouth: Mucous membranes are moist.  Eyes:     General: No scleral icterus.    Pupils: Pupils are equal, round, and reactive to light.  Cardiovascular:     Rate and Rhythm: Normal rate and regular rhythm.     Pulses: Normal pulses.     Heart sounds: Normal heart sounds.  Pulmonary:     Effort: Pulmonary effort is normal.     Breath sounds: Normal breath sounds.  Abdominal:     General: There is no distension.     Palpations: There is no mass.     Tenderness: There is no abdominal tenderness. There is no guarding.  Genitourinary:    Comments: Bilateral perirectal tenderness and fluctuance, no bleeding Musculoskeletal:        General: Normal range of motion.     Cervical back: Normal range of motion and neck supple. No tenderness.  Skin:    General: Skin is warm and dry.  Neurological:     Mental Status: He is alert and oriented to person, place, and time.  Psychiatric:        Mood and Affect: Mood normal.     Assessment/Plan: Bilateral perirectal abscess, larger on the right -plan need incision and drainage in the operating room tomorrow.  Continue to hold Eliquis.  ED has started Rocephin and Flagyl IV.  I will discuss further with Dr. Barry Dienes.  Agree with medical admission.  Vague pancreatic hypodensity -work-up further as outpatient    Zenovia Jarred 01/28/2020, 10:50 PM

## 2020-01-28 NOTE — ED Notes (Signed)
Patient transported to CT 

## 2020-01-28 NOTE — ED Triage Notes (Signed)
Pt reports rectal swelling/pain x1 week. pt reports dark stools, denies any visible blood and loose BM's, states he's taken stool softeners due to constipation

## 2020-01-28 NOTE — Consult Note (Signed)
Reason for Consult:perirectal abscesses Referring Physician: Alyse Low, PA-C  Cody Arellano is an 74 y.o. male.  HPI: 74 year old male status post CABG x2 in May of this year presents to the emergency department with 1 week history of perirectal pain.  He has been using stool softeners and Preparation H without any relief.  He came to the emergency department and was evaluated further with laboratory studies showing leukocytosis of 16,600.  Additionally, he underwent CT scan of the abdomen pelvis which shows bilateral perirectal abscesses, larger on the right side.  He takes Eliquis but has not had a dose today because he has been waiting in the emergency department.  Past Medical History:  Diagnosis Date  . Anxiety   . Chronic tension headache   . Colon polyps   . Coronary artery disease 2008/2009   MI with PCI x 2, then PCI x 1 in 2009  . Depression   . Dyslipidemia   . Dysrhythmia    atrial fibrillation  . Hyperlipidemia   . Hypertension   . Lupus [facility])    sees Dr Trudie Reed  . Lupus disease of the lung   . 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 Myocardial infarction ([place]) 2008  . 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 Shortness of breath     Past Surgical History:  Procedure Laterality Date  . CARDIAC CATHETERIZATION    . CLIPPING OF ATRIAL APPENDAGE N/A 10/17/2019   Procedure: Clipping Of Atrial Appendage using AtriCure [id number] 45 MM AtriClip.;  Surgeon: Rexene Alberts, MD;  Location: [place];  Service: Open Heart Surgery;  Laterality: N/A;  . CORONARY ARTERY BYPASS GRAFT N/A 10/17/2019   Procedure: CORONARY ARTERY BYPASS GRAFTING (CABG) using LIMA to LAD; Endoscopic harvest right greater saphenous vein: SVG to RAMUS.;  Surgeon: Rexene Alberts, MD;  Location: [place];  Service: Open Heart Surgery;  Laterality: N/A;  . CORONARY BALLOON ANGIOPLASTY N/A 10/17/2019   Procedure: CORONARY BALLOON ANGIOPLASTY;  Surgeon: Nelva Bush,  MD;  Location: [place] [facility];  Service: Cardiovascular;  Laterality: N/A;  . coronary stents  2009  . CORONARY/GRAFT ACUTE MI REVASCULARIZATION N/A 10/17/2019   Procedure: Coronary/Graft Acute MI Revascularization;  Surgeon: Nelva Bush, MD;  Location: [place] [facility];  Service: Cardiovascular;  Laterality: N/A;  . ENDOVEIN HARVEST OF GREATER SAPHENOUS VEIN Right 10/17/2019   Procedure: [place] Of Greater Saphenous Vein;  Surgeon: Rexene Alberts, MD;  Location: [place];  Service: Open Heart Surgery;  Laterality: Right;  . IABP INSERTION N/A 10/17/2019   Procedure: IABP Insertion;  Surgeon: Nelva Bush, MD;  Location: [place] [facility];  Service: Cardiovascular;  Laterality: N/A;  . IR THORACENTESIS ASP PLEURAL SPACE W/IMG GUIDE  10/26/2019  . RIGHT/LEFT HEART CATH AND CORONARY ANGIOGRAPHY N/A 10/17/2019   Procedure: RIGHT/LEFT HEART CATH AND CORONARY ANGIOGRAPHY;  Surgeon: Nelva Bush, MD;  Location: [place] [facility];  Service: Cardiovascular;  Laterality: N/A;  . TEE WITHOUT CARDIOVERSION  10/17/2019   Procedure: Transesophageal Echocardiogram (Tee);  Surgeon: Rexene Alberts, MD;  Location: [facility] OR;  Service: Open Heart Surgery;;    Family History  Problem Relation Age of Onset  . Heart disease Mother   . Alcohol abuse Father   . Hyperlipidemia Sister   . Hypertension Sister   . Hyperlipidemia Brother   . Hypertension Brother   . Cancer Brother        ?  lung cancer  . Stomach cancer Brother   . Diabetes Paternal Uncle   . Colon cancer Neg Hx   . Esophageal cancer Neg Hx   . Rectal cancer Neg Hx     Social History:  reports that he quit smoking about 26 years ago. His smoking use included cigarettes. He has a 20.00 pack-year smoking history. He has never used smokeless tobacco. He reports that he does not drink alcohol and does not use drugs.  Allergies: No Known Allergies  Medications: I have reviewed the patient's current medications.  Results for  orders placed or performed during the hospital encounter of 01/28/20 (from the past 48 hour(s))  CBC with Differential     Status: Abnormal   Collection Time: 01/28/20  7:49 AM  Result Value Ref Range   WBC 16.6 (H) 4.0 - 10.5 K/uL   RBC 3.62 (L) 4.22 - 5.81 MIL/uL   Hemoglobin 10.1 (L) 13.0 - 17.0 g/dL   HCT 33.9 (L) 39 - 52 %   MCV 93.6 80.0 - 100.0 fL   MCH 27.9 26.0 - 34.0 pg   MCHC 29.8 (L) 30.0 - 36.0 g/dL   RDW 17.2 (H) 11.5 - 15.5 %   Platelets 335 150 - 400 K/uL   nRBC 0.0 0.0 - 0.2 %   Neutrophils Relative % 89 %   Neutro Abs 14.9 (H) 1.7 - 7.7 K/uL   Lymphocytes Relative 4 %   Lymphs Abs 0.6 (L) 0.7 - 4.0 K/uL   Monocytes Relative 5 %   Monocytes Absolute 0.9 0 - 1 K/uL   Eosinophils Relative 1 %   Eosinophils Absolute 0.1 0 - 0 K/uL   Basophils Relative 0 %   Basophils Absolute 0.0 0 - 0 K/uL   Immature Granulocytes 1 %   Abs Immature Granulocytes 0.13 (H) 0.00 - 0.07 K/uL    Comment: Performed at [facility], [address] [place], [place] [address]  Comprehensive metabolic panel     Status: Abnormal   Collection Time: 01/28/20  7:49 AM  Result Value Ref Range   Sodium 135 135 - 145 mmol/L   Potassium 4.5 3.5 - 5.1 mmol/L   Chloride 103 98 - 111 mmol/L   CO2 22 22 - 32 mmol/L   Glucose, Bld 129 (H) 70 - 99 mg/dL    Comment: Glucose reference range applies only to samples taken after fasting for at least 8 hours.   BUN 10 8 - 23 mg/dL   Creatinine, Ser 0.99 0.61 - 1.24 mg/dL   Calcium 8.5 (L) 8.9 - 10.3 mg/dL   Total Protein 5.9 (L) 6.5 - 8.1 g/dL   Albumin 2.2 (L) 3.5 - 5.0 g/dL   AST 17 15 - 41 U/L   ALT 20 0 - 44 U/L   Alkaline Phosphatase 44 38 - 126 U/L   Total Bilirubin 0.3 0.3 - 1.2 mg/dL   GFR calc non Af Amer >60 >60 mL/min   GFR calc Af Amer >60 >60 mL/min   Anion gap 10 5 - 15    Comment: Performed at [facility], [place] [address] [place], [place] [address]  POC occult blood, ED Provider will collect     Status: None   Collection  Time: 01/28/20  7:50 PM  Result Value Ref Range   Fecal Occult Bld NEGATIVE NEGATIVE    CT ABDOMEN PELVIS W CONTRAST  Result Date: 01/28/2020 CLINICAL DATA:  Rectal pain and swelling for 1 week with  dark stools, initial encounter EXAM: CT ABDOMEN AND PELVIS WITH CONTRAST TECHNIQUE: Multidetector CT imaging of the abdomen and pelvis was performed using the standard protocol following bolus administration of intravenous contrast. CONTRAST:  191mL OMNIPAQUE IOHEXOL 300 MG/ML  SOLN COMPARISON:  11/13/2019 FINDINGS: Lower chest: Emphysematous changes are noted in the lung bases with multiple bulla and blebs. Some associated nodular densities are noted stable in appearance from the prior exam. Hepatobiliary: No focal liver abnormality is seen. No gallstones, gallbladder wall thickening, or biliary dilatation. Pancreas: Pancreas is well visualized. There is a vague hypodensity identified in the body of the pancreas near the junction with the head suspicious for focal mass. No ductal dilatation is identified. The remainder of the pancreas is within normal limits. Spleen: Normal in size without focal abnormality. Adrenals/Urinary Tract: Adrenal glands are within normal limits. Kidneys demonstrate a normal enhancement pattern bilaterally. Exophytic cyst is noted from the right kidney laterally. No obstructive changes are seen. The bladder is partially distended. Along the inferior margin of the bladder there is significant in growth of the prostate. Possibility of prostate mass lesion could not be totally excluded. Stomach/Bowel: The rectum is decompressed. Surrounding the rectum, there are multiple peripherally enhancing fluid attenuation areas the largest of which is noted to the right of the rectum measuring approximately 6.6 x 3.7 cm. These encircle the rectum and extends superiorly adjacent to the prostate as well as the inferior aspect of the bladder posteriorly. The more proximal colon shows fecal material  within although no obstructive changes are seen. The appendix is unremarkable. Small bowel and stomach appear within normal limits. Vascular/Lymphatic: Aortic calcifications are noted without aneurysmal dilatation or dissection. No sizable lymphadenopathy is noted. Reproductive: Prostate is enlarged in size with ingrowth into the inferior aspect of the bladder. The possibility of a prostate mass could not be totally excluded. Direct visualization is recommended when the patient's condition improves. The seminal vesicles are involved with the inflammatory change surrounding the rectum. Other: No free fluid is noted.  No hernia is seen. Musculoskeletal: No acute or significant osseous findings. IMPRESSION: Multifocal peripherally enhancing fluid collections consistent with abscesses surrounding the rectum. The largest of this area measures approximately 6.6 x 3.7 cm. Multiple smaller components are noted. This extends superiorly towards the seminal vesicle on the right as well as the inferior wall of the bladder and right lateral aspect of the prostate. Prominent prostate with ingrowth into the urinary bladder. This is likely related to prosthetic hypertrophy although the possibility of a prostate mass could not be totally excluded. Direct visualization is recommended when the patient's condition improves. Vague hypodensity in the body of the pancreas as described. The possibility of an underlying mass deserves consideration. This can be evaluated on non emergent outpatient MRI to allow for optimum imaging when the patient's condition improves. Electronically Signed   By: Inez Catalina M.D.   On: 01/28/2020 21:56    Review of Systems  Constitutional: Negative.   HENT: Negative.   Eyes: Negative.   Respiratory: Negative.   Cardiovascular: Negative for chest pain.  Gastrointestinal: Positive for rectal pain. Negative for vomiting.  Endocrine: Negative.   Genitourinary: Negative.   Musculoskeletal: Negative.    Skin: Negative.   Allergic/Immunologic: Negative.   Neurological: Negative.   Hematological: Negative.   Psychiatric/Behavioral: Negative.    Blood pressure (!) 98/50, pulse 72, temperature 98.4 F (36.9 C), temperature source Oral, resp. rate 17, SpO2 95 %. Physical Exam Constitutional:      General: He  is not in acute distress.    Appearance: Normal appearance.  HENT:     Head: Normocephalic.     Right Ear: External ear normal.     Left Ear: External ear normal.     Nose: Nose normal. No congestion.     Mouth/Throat:     Mouth: Mucous membranes are moist.  Eyes:     General: No scleral icterus.    Pupils: Pupils are equal, round, and reactive to light.  Cardiovascular:     Rate and Rhythm: Normal rate and regular rhythm.     Pulses: Normal pulses.     Heart sounds: Normal heart sounds.  Pulmonary:     Effort: Pulmonary effort is normal.     Breath sounds: Normal breath sounds.  Abdominal:     General: There is no distension.     Palpations: There is no mass.     Tenderness: There is no abdominal tenderness. There is no guarding.  Genitourinary:    Comments: Bilateral perirectal tenderness and fluctuance, no bleeding Musculoskeletal:        General: Normal range of motion.     Cervical back: Normal range of motion and neck supple. No tenderness.  Skin:    General: Skin is warm and dry.  Neurological:     Mental Status: He is alert and oriented to person, place, and time.  Psychiatric:        Mood and Affect: Mood normal.     Assessment/Plan: Bilateral perirectal abscess, larger on the right -plan need incision and drainage in the operating room tomorrow.  Continue to hold Eliquis.  ED has started Rocephin and Flagyl IV.  I will discuss further with Dr. Barry Dienes.  Agree with medical admission.  Vague pancreatic hypodensity -work-up further as outpatient    Zenovia Jarred 01/28/2020, 10:50 PM

## 2020-01-28 NOTE — H&P (Signed)
History and Physical    Cody Arellano IHK:[id number] DOB: [birth year] DOA: 01/28/2020  PCP: Venia Carbon, MD  Patient coming from: Home  I have personally briefly reviewed patient's old medical records in [place]  Chief Complaint: Rectal pain  HPI: Cody Arellano is a 74 y.o. 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  He has noticed rectal pain for about 3 weeks but presented because it has been much worse this week.  It hurts constantly and is a 10 out of 10 pain with movement.  Pain decreases to about 5 out of 10 if he lays in a comfortable position.  Denies any pain with bowel movement but states that he has been taking stool softeners.  Denies bright red blood per rectum but has noticed dark stool for the past 10 days and does not take any iron supplementation.  He denies any abdominal pain, nausea or vomiting.  States he has been having less appetite since his CABG back in May and has lost about 20 pounds.  Denies current tobacco, alcohol illicit drug use.  CT abdomen and pelvis scan showed multifocal abscesses surrounding the rectum and there is also prominent prostate with ingrowth into the urinary bladder as well as a vague hypodensity in the body of the pancreas.  Patient does endorse that he frequently has dysuria and dribbling urinary stream.  General surgery has been consulted and will take patient to the OR for incision and drainage tomorrow.  He is on Eliquis but last dose was yesterday on 8/25.  He is currently afebrile, has leukocytosis with WBC of 16.6, worsening normocytic anemia with hemoglobin of 10.1 from a prior of 12.9.  FOBT negative.  Review of Systems:  Constitutional: No Weight Change, No Fever ENT/Mouth: No sore throat, No Rhinorrhea Eyes: No Eye Pain, No Vision  Changes Cardiovascular: No Chest Pain, no SOB Respiratory: No Cough, No Sputum, Gastrointestinal: No Nausea, No Vomiting, No Diarrhea, + Constipation, No Pain Genitourinary: no Urinary Incontinence, No Urgency, No Flank Pain Musculoskeletal: No Arthralgias, No Myalgias Skin: No Skin Lesions, No Pruritus, Neuro: no Weakness, No Numbness Psych: No Anxiety/Panic, No Depression, + decrease appetite Heme/Lymph: No Bruising, No Bleeding  Past Medical History:  Diagnosis Date  . Anxiety   . Chronic tension headache   . Colon polyps   . Coronary artery disease 2008/2009   MI with PCI x 2, then PCI x 1 in 2009  . Depression   . Dyslipidemia   . Dysrhythmia    atrial fibrillation  . Hyperlipidemia   . Hypertension   . Lupus [facility])    sees Dr Trudie Reed  . Lupus disease of the lung   . 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 Myocardial infarction ([place]) 2008  . 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 Shortness of breath     Past Surgical History:  Procedure Laterality Date  . CARDIAC CATHETERIZATION    . CLIPPING OF ATRIAL APPENDAGE N/A 10/17/2019   Procedure: Clipping Of Atrial Appendage using AtriCure [id number] 45 MM AtriClip.;  Surgeon: Rexene Alberts, MD;  Location: [facility];  Service: Open Heart Surgery;  Laterality: N/A;  . CORONARY ARTERY BYPASS GRAFT N/A 10/17/2019   Procedure: CORONARY ARTERY BYPASS GRAFTING (CABG) using LIMA to LAD; Endoscopic  harvest right greater saphenous vein: SVG to RAMUS.;  Surgeon: Rexene Alberts, MD;  Location: [facility];  Service: Open Heart Surgery;  Laterality: N/A;  . CORONARY BALLOON ANGIOPLASTY N/A 10/17/2019   Procedure: CORONARY BALLOON ANGIOPLASTY;  Surgeon: Nelva Bush, MD;  Location: [place] [facility];  Service: Cardiovascular;  Laterality: N/A;  . coronary stents  2009  . CORONARY/GRAFT ACUTE MI REVASCULARIZATION N/A 10/17/2019   Procedure: Coronary/Graft Acute MI Revascularization;  Surgeon: Nelva Bush, MD;  Location: [place] [facility];  Service: Cardiovascular;  Laterality: N/A;  . ENDOVEIN HARVEST OF GREATER SAPHENOUS VEIN Right 10/17/2019   Procedure: [place] Of Greater Saphenous Vein;  Surgeon: Rexene Alberts, MD;  Location: [facility];  Service: Open Heart Surgery;  Laterality: Right;  . IABP INSERTION N/A 10/17/2019   Procedure: IABP Insertion;  Surgeon: Nelva Bush, MD;  Location: [place] [facility];  Service: Cardiovascular;  Laterality: N/A;  . IR THORACENTESIS ASP PLEURAL SPACE W/IMG GUIDE  10/26/2019  . RIGHT/LEFT HEART CATH AND CORONARY ANGIOGRAPHY N/A 10/17/2019   Procedure: RIGHT/LEFT HEART CATH AND CORONARY ANGIOGRAPHY;  Surgeon: Nelva Bush, MD;  Location: [place] [facility];  Service: Cardiovascular;  Laterality: N/A;  . TEE WITHOUT CARDIOVERSION  10/17/2019   Procedure: Transesophageal Echocardiogram ([place]);  Surgeon: Rexene Alberts, MD;  Location: [facility] OR;  Service: Open Heart Surgery;;     reports that he quit smoking about 26 years ago. His smoking use included cigarettes. He has a 20.00 pack-year smoking history. He has never used smokeless tobacco. He reports that he does not drink alcohol and does not use drugs. Social History  No Known Allergies  Family History  Problem Relation Age of Onset  . Heart disease Mother   . Alcohol abuse Father   . Hyperlipidemia Sister   . Hypertension Sister   . Hyperlipidemia Brother   . Hypertension Brother   . Cancer Brother        ?lung cancer  . Stomach cancer Brother   . Diabetes Paternal Uncle   . Colon cancer Neg Hx   . Esophageal cancer Neg Hx   . Rectal cancer Neg Hx      Prior to Admission medications   Medication Sig Start Date End Date Taking? Authorizing Provider  amiodarone (PACERONE) 200 MG tablet Please take 1 tab (200mg ) twice a day for 2 weeks and then 1 tab (200mg ) daily until we see you in follow-up. Patient taking differently: Take 200 mg by mouth every evening.  10/27/19    Elgie Collard, PA-C  apixaban (ELIQUIS) 2.5 MG TABS tablet Take 1 tablet (2.5 mg total) by mouth 2 (two) times daily. 10/27/19   Gold, Wilder Glade, PA-C  aspirin EC 81 MG tablet Take 1 tablet (81 mg total) by mouth daily. 08/10/14   Nahser, Wonda Cheng, MD  azaTHIOprine (IMURAN) 50 MG tablet Take 50 mg by mouth in the morning and at bedtime.  10/11/14   [provider]  hydroxychloroquine (PLAQUENIL) 200 MG tablet Take 400 mg by mouth every evening.     [provider]  losartan (COZAAR) 25 MG tablet Take 0.5 tablets (12.5 mg total) by mouth daily. 11/16/19   Elgie Collard, PA-C  oxyCODONE (OXY IR/ROXICODONE) 5 MG immediate release tablet Take 1 tablet (5 mg total) by mouth every 6 (six) hours as needed for severe pain. 10/27/19   Elgie Collard, PA-C  rosuvastatin (CRESTOR) 40 MG tablet Take 1 tablet (40 mg total) by mouth  daily. 10/28/19   Elgie Collard, PA-C  sertraline (ZOLOFT) 50 MG tablet Take 50 mg by mouth daily. Patient not taking: Reported on 11/16/2019    [provider]  spironolactone (ALDACTONE) 25 MG tablet Take 0.5 tablets (12.5 mg total) by mouth daily. 10/27/19   Elgie Collard, PA-C  tamsulosin (FLOMAX) 0.4 MG CAPS capsule Take 0.4 mg by mouth every evening.  04/02/17   [provider]    Physical Exam: Vitals:   01/28/20 1611 01/28/20 1846 01/28/20 1930 01/28/20 2045  BP: 114/62 (!) 104/55 (!) 102/46 (!) 98/50  Pulse: 66 73 74 72  Resp: 15 16 18 17   Temp: 98.4 F (36.9 C)     TempSrc: Oral     SpO2: 96% 100% 98% 95%    Constitutional: NAD, calm, comfortable, thin elderly male laying on his right side with visible increase abdominal girth Vitals:   01/28/20 1611 01/28/20 1846 01/28/20 1930 01/28/20 2045  BP: 114/62 (!) 104/55 (!) 102/46 (!) 98/50  Pulse: 66 73 74 72  Resp: 15 16 18 17   Temp: 98.4 F (36.9 C)     TempSrc: Oral     SpO2: 96% 100% 98% 95%   Eyes: PERRL, lids and conjunctivae normal ENMT: Mucous membranes are moist.  Neck:  normal, supple Respiratory: clear to auscultation bilaterally, no wheezing, no crackles. Normal respiratory effort. No accessory muscle use.  Cardiovascular: Regular rate and rhythm, no murmurs / rubs / gallops. No extremity edema.  Abdomen: no tenderness, no masses palpated.Bowel sounds positive.  Rectal: No bright red blood per rectum, no external hemorrhoids, pain with light palpation in the area surrounding rectum with no induration or significant fluctuance palpated.   Musculoskeletal: no clubbing / cyanosis. No joint deformity upper and lower extremities. Good ROM, no contractures. Normal muscle tone.  Skin: no rashes, lesions, ulcers. No induration Neurologic: CN 2-12 grossly intact. Sensation intact. Strength 5/5 in all 4.  Psychiatric: Normal judgment and insight. Alert and oriented x 3. Normal mood.     Labs on Admission: I have personally reviewed following labs and imaging studies  CBC: Recent Labs  Lab 01/28/20 0749  WBC 16.6*  NEUTROABS 14.9*  HGB 10.1*  HCT 33.9*  MCV 93.6  PLT 536   Basic Metabolic Panel: Recent Labs  Lab 01/28/20 0749  NA 135  K 4.5  CL 103  CO2 22  GLUCOSE 129*  BUN 10  CREATININE 0.99  CALCIUM 8.5*   GFR: CrCl cannot be calculated (Unknown ideal weight.). Liver Function Tests: Recent Labs  Lab 01/28/20 0749  AST 17  ALT 20  ALKPHOS 44  BILITOT 0.3  PROT 5.9*  ALBUMIN 2.2*   No results for input(s): LIPASE, AMYLASE in the last 168 hours. No results for input(s): AMMONIA in the last 168 hours. Coagulation Profile: No results for input(s): INR, PROTIME in the last 168 hours. Cardiac Enzymes: No results for input(s): CKTOTAL, CKMB, CKMBINDEX, TROPONINI in the last 168 hours. BNP (last 3 results) No results for input(s): PROBNP in the last 8760 hours. HbA1C: No results for input(s): HGBA1C in the last 72 hours. CBG: No results for input(s): GLUCAP in the last 168 hours. Lipid Profile: No results for input(s): CHOL, HDL,  LDLCALC, TRIG, CHOLHDL, LDLDIRECT in the last 72 hours. Thyroid Function Tests: No results for input(s): TSH, T4TOTAL, FREET4, T3FREE, THYROIDAB in the last 72 hours. Anemia Panel: No results for input(s): VITAMINB12, FOLATE, FERRITIN, TIBC, IRON, RETICCTPCT in the last 72 hours. Urine analysis:  Component Value Date/Time   COLORURINE AMBER (A) 02/04/2013 0200   APPEARANCEUR CLEAR 02/04/2013 0200   LABSPEC 1.024 02/04/2013 0200   PHURINE 5.5 02/04/2013 0200   GLUCOSEU NEGATIVE 02/04/2013 0200   HGBUR SMALL (A) 02/04/2013 0200   BILIRUBINUR SMALL (A) 02/04/2013 0200   KETONESUR 15 (A) 02/04/2013 0200   PROTEINUR 30 (A) 02/04/2013 0200   UROBILINOGEN 2.0 (H) 02/04/2013 0200   NITRITE NEGATIVE 02/04/2013 0200   LEUKOCYTESUR TRACE (A) 02/04/2013 0200    Radiological Exams on Admission: CT ABDOMEN PELVIS W CONTRAST  Result Date: 01/28/2020 CLINICAL DATA:  Rectal pain and swelling for 1 week with dark stools, initial encounter EXAM: CT ABDOMEN AND PELVIS WITH CONTRAST TECHNIQUE: Multidetector CT imaging of the abdomen and pelvis was performed using the standard protocol following bolus administration of intravenous contrast. CONTRAST:  176mL OMNIPAQUE IOHEXOL 300 MG/ML  SOLN COMPARISON:  11/13/2019 FINDINGS: Lower chest: Emphysematous changes are noted in the lung bases with multiple bulla and blebs. Some associated nodular densities are noted stable in appearance from the prior exam. Hepatobiliary: No focal liver abnormality is seen. No gallstones, gallbladder wall thickening, or biliary dilatation. Pancreas: Pancreas is well visualized. There is a vague hypodensity identified in the body of the pancreas near the junction with the head suspicious for focal mass. No ductal dilatation is identified. The remainder of the pancreas is within normal limits. Spleen: Normal in size without focal abnormality. Adrenals/Urinary Tract: Adrenal glands are within normal limits. Kidneys demonstrate a normal  enhancement pattern bilaterally. Exophytic cyst is noted from the right kidney laterally. No obstructive changes are seen. The bladder is partially distended. Along the inferior margin of the bladder there is significant in growth of the prostate. Possibility of prostate mass lesion could not be totally excluded. Stomach/Bowel: The rectum is decompressed. Surrounding the rectum, there are multiple peripherally enhancing fluid attenuation areas the largest of which is noted to the right of the rectum measuring approximately 6.6 x 3.7 cm. These encircle the rectum and extends superiorly adjacent to the prostate as well as the inferior aspect of the bladder posteriorly. The more proximal colon shows fecal material within although no obstructive changes are seen. The appendix is unremarkable. Small bowel and stomach appear within normal limits. Vascular/Lymphatic: Aortic calcifications are noted without aneurysmal dilatation or dissection. No sizable lymphadenopathy is noted. Reproductive: Prostate is enlarged in size with ingrowth into the inferior aspect of the bladder. The possibility of a prostate mass could not be totally excluded. Direct visualization is recommended when the patient's condition improves. The seminal vesicles are involved with the inflammatory change surrounding the rectum. Other: No free fluid is noted.  No hernia is seen. Musculoskeletal: No acute or significant osseous findings. IMPRESSION: Multifocal peripherally enhancing fluid collections consistent with abscesses surrounding the rectum. The largest of this area measures approximately 6.6 x 3.7 cm. Multiple smaller components are noted. This extends superiorly towards the seminal vesicle on the right as well as the inferior wall of the bladder and right lateral aspect of the prostate. Prominent prostate with ingrowth into the urinary bladder. This is likely related to prosthetic hypertrophy although the possibility of a prostate mass could  not be totally excluded. Direct visualization is recommended when the patient's condition improves. Vague hypodensity in the body of the pancreas as described. The possibility of an underlying mass deserves consideration. This can be evaluated on non emergent outpatient MRI to allow for optimum imaging when the patient's condition improves. Electronically Signed   By: Elta Guadeloupe  Lukens M.D.   On: 01/28/2020 21:56      Assessment/Plan  Rectal abscess Surgery has been consulted and will take the OR in the morning. Keep n.p.o. past midnight. Continue to hold Eliquis.  Last dose 8/25 Continue IV Rocephin and Flagyl  Hypotension Likely due to hypovolemia.  Does not appear to be septic at this time. Hold all antihypertensives.  Has received 1 L normal saline fluid in the ED.  Normocytic anemia Hemoglobin of 10.1 worsening from 12.9 several months prior Obtain iron panel, vitamin B12 and folate FOBT negative  Prostatic hypertrophy Prostate cancer cannot be excluded on CT.  Patient also symptomatic with dribbling urine stream and has history of BPH. Obtain PSA  Hypodensity on head of pancreas Seen on CT abdomen and pelvis Obtain CA 19-9 and LDH  CAD s/p CABG Asymptomatic.  Continue aspirin.  Postoperative atrial fibrillation Currently in sinus rhythm.  Per cardiology documentation in 11/2019 there is plans to discontinue his Eliquis and amiodarone in 3 months if he remains in sinus rhythm.  Chronic combined systolic and diastolic heart failure EF of 45 to 50% on echo in 10/2019.  Appears somewhat hypovolemic at this time.  Hyperlipidemia Continue statin  DVT prophylaxis: SCDs Code Status: Full Family Communication: Plan discussed with patient at bedside  disposition Plan: Home with at least 2 midnight stays  Consults called: General surgery Admission status: inpatient Status is: Inpatient  Remains inpatient appropriate because:Inpatient level of care appropriate due to severity of  illness   Dispo: The patient is from: Home              Anticipated d/c is to: Home              Anticipated d/c date is: 3 days              Patient currently is not medically stable to d/c.         Orene Desanctis DO [facility]   If 7PM-7AM, please contact night-coverage [email or web address]   01/28/2020, 11:50 PM

## 2020-01-28 NOTE — Telephone Encounter (Signed)
Just an FYI. We have made several attempts to contact this patient including sending a letter to schedule or reschedule their echocardiogram. We will be removing the patient from the echo [place].  01/08/20 MAILED LETTER [facility]  01/18/2020 [facility] to reschedule@ 2:04/[facility]  01/18/2020 PT NO SHOWED      Thank you

## 2020-01-28 NOTE — ED Notes (Signed)
Called for vitals and no response 

## 2020-01-29 ENCOUNTER — Encounter (HOSPITAL_COMMUNITY): Payer: Self-pay | Admitting: Family Medicine

## 2020-01-29 ENCOUNTER — Inpatient Hospital Stay (HOSPITAL_COMMUNITY): Payer: Medicare Other | Admitting: Anesthesiology

## 2020-01-29 ENCOUNTER — Encounter (HOSPITAL_COMMUNITY)
Admission: EM | Disposition: A | Discharge status: Home Health | Payer: Self-pay | Source: Home / Self Care | Attending: Internal Medicine

## 2020-01-29 DIAGNOSIS — N400 Enlarged prostate without lower urinary tract symptoms: Secondary | ICD-10-CM

## 2020-01-29 DIAGNOSIS — K869 Disease of pancreas, unspecified: Secondary | ICD-10-CM

## 2020-01-29 DIAGNOSIS — I959 Hypotension, unspecified: Secondary | ICD-10-CM

## 2020-01-29 DIAGNOSIS — D649 Anemia, unspecified: Secondary | ICD-10-CM

## 2020-01-29 HISTORY — PX: INCISION AND DRAINAGE ABSCESS: SHX5864

## 2020-01-29 LAB — URINALYSIS, ROUTINE W REFLEX MICROSCOPIC
Bacteria, UA: NONE SEEN
Bilirubin Urine: NEGATIVE
Glucose, UA: NEGATIVE mg/dL
Ketones, ur: 5 mg/dL — AB
Nitrite: POSITIVE — AB
Protein, ur: NEGATIVE mg/dL
Specific Gravity, Urine: >1.046 — ABNORMAL HIGH (ref 1.005–1.030)
pH: 5 (ref 5.0–8.0)

## 2020-01-29 LAB — IRON AND TIBC
Iron: 10 ug/dL — ABNORMAL LOW (ref 45–182)
Saturation Ratios: 5 % — ABNORMAL LOW (ref 17.9–39.5)
TIBC: 186 ug/dL — ABNORMAL LOW (ref 250–450)
UIBC: 176 ug/dL

## 2020-01-29 LAB — PSA: Prostatic Specific Antigen: 2.19 ng/mL (ref 0.00–4.00)

## 2020-01-29 LAB — BASIC METABOLIC PANEL
Anion gap: 12 (ref 5–15)
BUN: 9 mg/dL (ref 8–23)
CO2: 19 mmol/L — ABNORMAL LOW (ref 22–32)
Calcium: 8.4 mg/dL — ABNORMAL LOW (ref 8.9–10.3)
Chloride: 104 mmol/L (ref 98–111)
Creatinine, Ser: 0.84 mg/dL (ref 0.61–1.24)
GFR calc Af Amer: >60 mL/min (ref >60)
GFR calc non Af Amer: >60 mL/min (ref >60)
Glucose, Bld: 76 mg/dL (ref 70–99)
Potassium: 4.4 mmol/L (ref 3.5–5.1)
Sodium: 135 mmol/L (ref 135–145)

## 2020-01-29 LAB — CBC
HCT: 30.8 % — ABNORMAL LOW (ref 39.0–52.0)
Hemoglobin: 9.5 g/dL — ABNORMAL LOW (ref 13.0–17.0)
MCH: 28.6 pg (ref 26.0–34.0)
MCHC: 30.8 g/dL (ref 30.0–36.0)
MCV: 92.8 fL (ref 80.0–100.0)
Platelets: 316 10*3/uL (ref 150–400)
RBC: 3.32 MIL/uL — ABNORMAL LOW (ref 4.22–5.81)
RDW: 17.2 % — ABNORMAL HIGH (ref 11.5–15.5)
WBC: 20.2 10*3/uL — ABNORMAL HIGH (ref 4.0–10.5)
nRBC: 0 % (ref 0.0–0.2)

## 2020-01-29 LAB — SARS CORONAVIRUS 2 BY RT PCR (HOSPITAL ORDER, PERFORMED IN ~~LOC~~ HOSPITAL LAB): SARS Coronavirus 2: NEGATIVE

## 2020-01-29 LAB — VITAMIN B12: Vitamin B-12: 340 pg/mL (ref 180–914)

## 2020-01-29 LAB — SURGICAL PCR SCREEN
MRSA, PCR: NEGATIVE
Staphylococcus aureus: NEGATIVE

## 2020-01-29 LAB — FOLATE: Folate: 7.9 ng/mL (ref >5.9)

## 2020-01-29 LAB — LACTATE DEHYDROGENASE: LDH: 116 U/L (ref 98–192)

## 2020-01-29 SURGERY — INCISION AND DRAINAGE, ABSCESS
Anesthesia: General | Site: Perineum

## 2020-01-29 MED ORDER — ENSURE SURGERY PO LIQD
237.0000 mL | Freq: Two times a day (BID) | ORAL | Status: DC
  Administered 2020-01-30 – 2020-02-02 (×6): 237 mL via ORAL
  Filled 2020-01-29 (×8): qty 237

## 2020-01-29 MED ORDER — PROPOFOL 10 MG/ML IV BOLUS
INTRAVENOUS | Status: AC
  Filled 2020-01-29: qty 20

## 2020-01-29 MED ORDER — ENOXAPARIN SODIUM 40 MG/0.4ML ~~LOC~~ SOLN
40.0000 mg | SUBCUTANEOUS | Status: DC
  Administered 2020-01-30 – 2020-01-31 (×2): 40 mg via SUBCUTANEOUS
  Filled 2020-01-29 (×2): qty 0.4

## 2020-01-29 MED ORDER — TAMSULOSIN HCL 0.4 MG PO CAPS
0.4000 mg | ORAL_CAPSULE | Freq: Every evening | ORAL | Status: DC
  Administered 2020-01-29 – 2020-01-31 (×3): 0.4 mg via ORAL
  Filled 2020-01-29 (×4): qty 1

## 2020-01-29 MED ORDER — SUGAMMADEX SODIUM 200 MG/2ML IV SOLN
INTRAVENOUS | Status: DC | PRN
  Administered 2020-01-29: 200 mg via INTRAVENOUS

## 2020-01-29 MED ORDER — CHLORHEXIDINE GLUCONATE 0.12 % MT SOLN
15.0000 mL | OROMUCOSAL | Status: AC
  Filled 2020-01-29: qty 15

## 2020-01-29 MED ORDER — LIDOCAINE-EPINEPHRINE 1 %-1:100000 IJ SOLN
INTRAMUSCULAR | Status: AC
  Filled 2020-01-29: qty 1

## 2020-01-29 MED ORDER — DEXAMETHASONE SODIUM PHOSPHATE 10 MG/ML IJ SOLN
INTRAMUSCULAR | Status: DC | PRN
  Administered 2020-01-29: 10 mg via INTRAVENOUS

## 2020-01-29 MED ORDER — MUPIROCIN 2 % EX OINT
1.0000 "application " | TOPICAL_OINTMENT | Freq: Two times a day (BID) | CUTANEOUS | Status: DC
  Administered 2020-01-29 – 2020-02-02 (×9): 1 via NASAL
  Filled 2020-01-29 (×4): qty 22

## 2020-01-29 MED ORDER — FENTANYL CITRATE (PF) 250 MCG/5ML IJ SOLN
INTRAMUSCULAR | Status: DC | PRN
  Administered 2020-01-29: 150 ug via INTRAVENOUS

## 2020-01-29 MED ORDER — FENTANYL CITRATE (PF) 250 MCG/5ML IJ SOLN
INTRAMUSCULAR | Status: AC
  Filled 2020-01-29: qty 5

## 2020-01-29 MED ORDER — POLYETHYLENE GLYCOL 3350 17 G PO PACK
17.0000 g | PACK | Freq: Two times a day (BID) | ORAL | Status: DC
  Administered 2020-01-29 – 2020-02-02 (×6): 17 g via ORAL
  Filled 2020-01-29 (×7): qty 1

## 2020-01-29 MED ORDER — CHLORHEXIDINE GLUCONATE 0.12 % MT SOLN
OROMUCOSAL | Status: AC
  Filled 2020-01-29: qty 15

## 2020-01-29 MED ORDER — 0.9 % SODIUM CHLORIDE (POUR BTL) OPTIME
TOPICAL | Status: DC | PRN
  Administered 2020-01-29: 1000 mL

## 2020-01-29 MED ORDER — BUPIVACAINE HCL (PF) 0.25 % IJ SOLN
INTRAMUSCULAR | Status: DC | PRN
  Administered 2020-01-29: 20 mL

## 2020-01-29 MED ORDER — PHENYLEPHRINE HCL-NACL 10-0.9 MG/250ML-% IV SOLN
INTRAVENOUS | Status: DC | PRN
  Administered 2020-01-29: 40 ug/min via INTRAVENOUS

## 2020-01-29 MED ORDER — SODIUM CHLORIDE 0.9 % IV SOLN
2.0000 g | INTRAVENOUS | Status: DC
  Administered 2020-01-29 – 2020-01-31 (×3): 2 g via INTRAVENOUS
  Filled 2020-01-29 (×3): qty 20

## 2020-01-29 MED ORDER — ONDANSETRON HCL 4 MG/2ML IJ SOLN: 4.0000 mg | Freq: Four times a day (QID) | INTRAMUSCULAR | Status: DC | PRN

## 2020-01-29 MED ORDER — SENNOSIDES-DOCUSATE SODIUM 8.6-50 MG PO TABS
2.0000 | ORAL_TABLET | Freq: Two times a day (BID) | ORAL | Status: DC
  Administered 2020-01-29 – 2020-02-02 (×7): 2 via ORAL
  Filled 2020-01-29 (×7): qty 2

## 2020-01-29 MED ORDER — DEXTROSE-NACL 5-0.45 % IV SOLN: INTRAVENOUS | Status: DC

## 2020-01-29 MED ORDER — BUPIVACAINE LIPOSOME 1.3 % IJ SUSP
20.0000 mL | Freq: Once | INTRAMUSCULAR | Status: AC
  Administered 2020-01-29: 20 mL
  Filled 2020-01-29: qty 20

## 2020-01-29 MED ORDER — LACTATED RINGERS IV SOLN: INTRAVENOUS | Status: DC

## 2020-01-29 MED ORDER — ASPIRIN EC 81 MG PO TBEC
81.0000 mg | DELAYED_RELEASE_TABLET | Freq: Every day | ORAL | Status: DC
  Administered 2020-01-30 – 2020-02-02 (×4): 81 mg via ORAL
  Filled 2020-01-29 (×4): qty 1

## 2020-01-29 MED ORDER — ROCURONIUM BROMIDE 10 MG/ML (PF) SYRINGE
PREFILLED_SYRINGE | INTRAVENOUS | Status: DC | PRN
  Administered 2020-01-29: 50 mg via INTRAVENOUS

## 2020-01-29 MED ORDER — BUPIVACAINE HCL (PF) 0.25 % IJ SOLN
INTRAMUSCULAR | Status: AC
  Filled 2020-01-29: qty 30

## 2020-01-29 MED ORDER — OXYCODONE HCL 5 MG PO TABS
5.0000 mg | ORAL_TABLET | ORAL | Status: DC | PRN
  Administered 2020-01-29 – 2020-01-30 (×2): 5 mg via ORAL
  Administered 2020-01-31: 10 mg via ORAL
  Administered 2020-01-31: 5 mg via ORAL
  Administered 2020-01-31: 10 mg via ORAL
  Administered 2020-01-31 – 2020-02-01 (×2): 5 mg via ORAL
  Filled 2020-01-29 (×2): qty 1
  Filled 2020-01-29: qty 2
  Filled 2020-01-29 (×3): qty 1
  Filled 2020-01-29: qty 2

## 2020-01-29 MED ORDER — PROPOFOL 10 MG/ML IV BOLUS
INTRAVENOUS | Status: DC | PRN
  Administered 2020-01-29: 120 mg via INTRAVENOUS

## 2020-01-29 MED ORDER — CHLORHEXIDINE GLUCONATE CLOTH 2 % EX PADS
6.0000 | MEDICATED_PAD | Freq: Every day | CUTANEOUS | Status: DC
  Administered 2020-01-29 – 2020-02-02 (×5): 6 via TOPICAL

## 2020-01-29 MED ORDER — FERROUS SULFATE 325 (65 FE) MG PO TABS
325.0000 mg | ORAL_TABLET | Freq: Every day | ORAL | Status: DC
  Administered 2020-01-30 – 2020-02-02 (×4): 325 mg via ORAL
  Filled 2020-01-29 (×5): qty 1

## 2020-01-29 MED ORDER — POLYETHYLENE GLYCOL 3350 17 G PO PACK: 17.0000 g | PACK | Freq: Every day | ORAL | Status: DC

## 2020-01-29 MED ORDER — LIDOCAINE 2% (20 MG/ML) 5 ML SYRINGE
INTRAMUSCULAR | Status: DC | PRN
  Administered 2020-01-29: 50 mg via INTRAVENOUS

## 2020-01-29 MED ORDER — ONDANSETRON HCL 4 MG/2ML IJ SOLN
INTRAMUSCULAR | Status: DC | PRN
  Administered 2020-01-29: 4 mg via INTRAVENOUS

## 2020-01-29 MED ORDER — PHENYLEPHRINE 40 MCG/ML (10ML) SYRINGE FOR IV PUSH (FOR BLOOD PRESSURE SUPPORT)
PREFILLED_SYRINGE | INTRAVENOUS | Status: DC | PRN
  Administered 2020-01-29: 80 ug via INTRAVENOUS

## 2020-01-29 MED ORDER — ROSUVASTATIN CALCIUM 20 MG PO TABS
40.0000 mg | ORAL_TABLET | Freq: Every day | ORAL | Status: DC
  Administered 2020-01-30 – 2020-02-02 (×4): 40 mg via ORAL
  Filled 2020-01-29 (×4): qty 2

## 2020-01-29 SURGICAL SUPPLY — 28 items
BNDG GAUZE ELAST 4 BULKY (GAUZE/BANDAGES/DRESSINGS) IMPLANT
BRIEF STRETCH FOR OB PAD LRG (UNDERPADS AND DIAPERS) IMPLANT
CANISTER SUCT 3000ML PPV (MISCELLANEOUS) ×3 IMPLANT
COVER SURGICAL LIGHT HANDLE (MISCELLANEOUS) ×3 IMPLANT
COVER WAND RF STERILE (DRAPES) ×1 IMPLANT
DRAPE LAPAROSCOPIC ABDOMINAL (DRAPES) IMPLANT
DRAPE LAPAROTOMY 100X72 PEDS (DRAPES) IMPLANT
DRSG PAD ABDOMINAL 8X10 ST (GAUZE/BANDAGES/DRESSINGS) ×2 IMPLANT
ELECT CAUTERY BLADE 6.4 (BLADE) ×3 IMPLANT
ELECT REM PT RETURN 9FT ADLT (ELECTROSURGICAL) ×3
ELECTRODE REM PT RTRN 9FT ADLT (ELECTROSURGICAL) ×1 IMPLANT
GAUZE PACKING IODOFORM 1/2 (PACKING) IMPLANT
GAUZE PACKING IODOFORM 1/4X15 (PACKING) IMPLANT
GAUZE SPONGE 4X4 12PLY STRL (GAUZE/BANDAGES/DRESSINGS) ×2 IMPLANT
GLOVE BIO SURGEON STRL SZ 6 (GLOVE) ×3 IMPLANT
GLOVE INDICATOR 6.5 STRL GRN (GLOVE) ×3 IMPLANT
GOWN STRL REUS W/ TWL LRG LVL3 (GOWN DISPOSABLE) ×1 IMPLANT
GOWN STRL REUS W/TWL 2XL LVL3 (GOWN DISPOSABLE) ×3 IMPLANT
GOWN STRL REUS W/TWL LRG LVL3 (GOWN DISPOSABLE) ×2
KIT BASIN OR (CUSTOM PROCEDURE TRAY) ×3 IMPLANT
KIT TURNOVER KIT B (KITS) ×3 IMPLANT
NS IRRIG 1000ML POUR BTL (IV SOLUTION) ×3 IMPLANT
PACK GENERAL/GYN (CUSTOM PROCEDURE TRAY) ×3 IMPLANT
PAD ARMBOARD 7.5X6 YLW CONV (MISCELLANEOUS) ×3 IMPLANT
PENCIL SMOKE EVACUATOR (MISCELLANEOUS) ×3 IMPLANT
TOWEL GREEN STERILE (TOWEL DISPOSABLE) ×3 IMPLANT
TOWEL GREEN STERILE FF (TOWEL DISPOSABLE) ×3 IMPLANT
UNDERPAD 30X36 HEAVY ABSORB (UNDERPADS AND DIAPERS) IMPLANT

## 2020-01-29 NOTE — ED Notes (Addendum)
x2 attempt RN called 5N with no answer, Charge Jessica RN made aware.

## 2020-01-29 NOTE — Interval H&P Note (Signed)
History and Physical Interval Note:  01/29/2020 11:59 AM  Cody Arellano  has presented today for surgery, with the diagnosis of abscess.  The various methods of treatment have been discussed with the patient and family. After consideration of risks, benefits and other options for treatment, the patient has consented to  Procedure(s): INCISION AND DRAINAGE BILATERAL PERIRECTAL ABSCESS (N/A) as a surgical intervention.  The patient's history has been reviewed, patient examined, no change in status, stable for surgery.  I have reviewed the patient's chart and labs.  Questions were answered to the patient's satisfaction.     Stark Klein

## 2020-01-29 NOTE — Progress Notes (Signed)
PROGRESS NOTE  Cody Arellano STM:[id number] DOB: Jan 24, 1946 DOA: 01/28/2020 PCP: Venia Carbon, MD  HPI/Recap of past 24 hours: HPI: Cody Arellano is a 74 y.o. 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  He has noticed rectal pain for about 3 weeks but presented because it has been much worse this week.  It hurts constantly and is a 10 out of 10 pain with movement.  Pain decreases to about 5 out of 10 if he lays in a comfortable position.  Denies any pain with bowel movement but states that he has been taking stool softeners.  Denies bright red blood per rectum but has noticed dark stool for the past 10 days and does not take any iron supplementation.  He denies any abdominal pain, nausea or vomiting.  States he has been having less appetite since his CABG back in May and has lost about 20 pounds.  Denies current tobacco, alcohol illicit drug use.  CT abdomen and pelvis scan showed multifocal abscesses surrounding the rectum and there is also prominent prostate with ingrowth into the urinary bladder as well as a vague hypodensity in the body of the pancreas.  Patient does endorse that he frequently has dysuria and dribbling urinary stream.  General surgery has been consulted and will take patient to the OR for incision and drainage.  He is on Eliquis but last dose was on 8/25. FOBT negative.  01/29/20: Seen and examined.  Reports pain in his rectum 5/10.  Pain management in place.    Assessment/Plan: Principal Problem:   Rectal abscess Active Problems:   CAD (coronary artery disease)   Hyperlipemia   Paroxysmal atrial fibrillation (HCC)   Hypotension   Anemia   Prostatic hypertrophy   Pancreatic lesion  Sepsis 2/2 to Rectal abscesses Presented with leukocytosis wbc 16.6K and RR 26 and  rectal abscesses seen on CT scan Surgery has been consulted and will take the OR. Last dose of Eliquis 8/25 Continue Rocephin and Flagyl Pain control Gentle IV fluids D5 half normal saline at 50 cc/h while n.p.o.  Resolved hypotension Hold all antihypertensives.  Continue gentle IV fluid  Iron deficiency anemia Hemoglobin of 10.1 worsening from 12.9 several months prior Iron studies suggestive of iron deficiency FOBT negative Hold off IV Feraheme infusion for now due to sepsis Start p.o. ferrous sulfate 325 mg daily, laxative and stool softener  Prostatic hypertrophy Prostate cancer cannot be excluded on CT.  Patient also symptomatic with dribbling urine stream and has history of BPH. Monitor urine output  Hypodensity on head of pancreas Seen on CT abdomen and pelvis Need to repeat imaging outpatient  CAD s/p CABG Asymptomatic.  Continue aspirin and Crestor.  Postoperative atrial fibrillation Currently in sinus rhythm.  Per cardiology documentation in 11/2019 there is plans to discontinue his Eliquis and amiodarone in 3 months if he remains in sinus rhythm.  Chronic combined systolic and diastolic heart failure EF of 45 to 50% on echo in 10/2019.   Hypovolemic on exam, continue gentle IV fluid hydration while n.p.o.  Hyperlipidemia Continue statin  DVT prophylaxis: SCDs Code Status: Full Family Communication: Plan discussed with patient at bedside   Consults called: General surgery Admission status: inpatient Status is: Inpatient  Remains inpatient appropriate because:Inpatient level of care appropriate due to severity of illness   Dispo: The patient is from:  Home  Anticipated d/c is to: Home  Anticipated d/c date is: 01/31/2020  Patient currently is not medically stable to d/c due to ongoing treatment for sepsis.     Objective: Vitals:   01/29/20 0300 01/29/20 0432 01/29/20 0500 01/29/20 0601  BP: 108/68 (!)  107/59 103/66 121/82  Pulse: 67 65 65 74  Resp: 17 (!) 21 20 15   Temp:    (!) 97.5 F (36.4 C)  TempSrc:    Oral  SpO2: 96% 94% 91% 94%    Intake/Output Summary (Last 24 hours) at 01/29/2020 1042 Last data filed at 01/29/2020 0131 Gross per 24 hour  Intake 1200 ml  Output --  Net 1200 ml   There were no vitals filed for this visit.  Exam:  . General: 74 y.o. year-old male well developed well nourished in no acute distress.  Alert and oriented x3. . Cardiovascular: Regular rate and rhythm with no rubs or gallops.  No thyromegaly or JVD noted.   [name] Respiratory: Clear to auscultation with no wheezes or rales. Good inspiratory effort. . Abdomen: Soft nontender nondistended with normal bowel sounds x4 quadrants. . Musculoskeletal: No lower extremity edema. 2/4 pulses in all 4 extremities. [name] Psychiatry: Mood is appropriate for condition and setting   Data Reviewed: CBC: Recent Labs  Lab 01/28/20 0749 01/29/20 0505  WBC 16.6* 20.2*  NEUTROABS 14.9*  --   HGB 10.1* 9.5*  HCT 33.9* 30.8*  MCV 93.6 92.8  PLT 335 676   Basic Metabolic Panel: Recent Labs  Lab 01/28/20 0749 01/29/20 0505  NA 135 135  K 4.5 4.4  CL 103 104  CO2 22 19*  GLUCOSE 129* 76  BUN 10 9  CREATININE 0.99 0.84  CALCIUM 8.5* 8.4*   GFR: CrCl cannot be calculated (Unknown ideal weight.). Liver Function Tests: Recent Labs  Lab 01/28/20 0749  AST 17  ALT 20  ALKPHOS 44  BILITOT 0.3  PROT 5.9*  ALBUMIN 2.2*   No results for input(s): LIPASE, AMYLASE in the last 168 hours. No results for input(s): AMMONIA in the last 168 hours. Coagulation Profile: No results for input(s): INR, PROTIME in the last 168 hours. Cardiac Enzymes: No results for input(s): CKTOTAL, CKMB, CKMBINDEX, TROPONINI in the last 168 hours. BNP (last 3 results) No results for input(s): PROBNP in the last 8760 hours. HbA1C: No results for input(s): HGBA1C in the last 72 hours. CBG: No results for input(s): GLUCAP in the  last 168 hours. Lipid Profile: No results for input(s): CHOL, HDL, LDLCALC, TRIG, CHOLHDL, LDLDIRECT in the last 72 hours. Thyroid Function Tests: No results for input(s): TSH, T4TOTAL, FREET4, T3FREE, THYROIDAB in the last 72 hours. Anemia Panel: Recent Labs    01/29/20 0505  VITAMINB12 340  FOLATE 7.9  TIBC 186*  IRON 10*   Urine analysis:    Component Value Date/Time   COLORURINE YELLOW 01/29/2020 0524   APPEARANCEUR CLEAR 01/29/2020 0524   LABSPEC >1.046 (H) 01/29/2020 0524   PHURINE 5.0 01/29/2020 0524   GLUCOSEU NEGATIVE 01/29/2020 0524   HGBUR SMALL (A) 01/29/2020 0524   BILIRUBINUR NEGATIVE 01/29/2020 0524   KETONESUR 5 (A) 01/29/2020 0524   PROTEINUR NEGATIVE 01/29/2020 0524   UROBILINOGEN 2.0 (H) 02/04/2013 0200   NITRITE POSITIVE (A) 01/29/2020 0524   LEUKOCYTESUR SMALL (A) 01/29/2020 0524   Sepsis Labs: @LABRCNTIP (procalcitonin:4,lacticidven:4)  ) Recent Results (from the past 240 hour(s))  SARS Coronavirus 2 by RT PCR (hospital order, performed in [facility] hospital lab) Nasopharyngeal Nasopharyngeal Swab  Status: None   Collection Time: 01/28/20 11:00 PM   Specimen: Nasopharyngeal Swab  Result Value Ref Range Status   SARS Coronavirus 2 NEGATIVE NEGATIVE Final    Comment: (NOTE) SARS-CoV-2 target nucleic acids are NOT DETECTED.  The SARS-CoV-2 RNA is generally detectable in upper and lower respiratory specimens during the acute phase of infection. The lowest concentration of SARS-CoV-2 viral copies this assay can detect is 250 copies / mL. A negative result does not preclude SARS-CoV-2 infection and should not be used as the sole basis for treatment or other patient management decisions.  A negative result may occur with improper specimen collection / handling, submission of specimen other than nasopharyngeal swab, presence of viral mutation(s) within the areas targeted by this assay, and inadequate number of viral copies (<250 copies / mL). A  negative result must be combined with clinical observations, patient history, and epidemiological information.  Fact Sheet for Patients:   StrictlyIdeas.no  Fact Sheet for Healthcare Providers: BankingDealers.co.za  This test is not yet approved or  cleared by the [place] FDA and has been authorized for detection and/or diagnosis of SARS-CoV-2 by FDA under an Emergency Use Authorization (EUA).  This EUA will remain in effect (meaning this test can be used) for the duration of the COVID-19 declaration under Section 564(b)(1) of the Act, 21 U.S.C. section 360bbb-3(b)(1), unless the authorization is terminated or revoked sooner.  Performed at [facility], [place] [address], [place], [place] [address]       Studies: CT ABDOMEN PELVIS W CONTRAST  Result Date: 01/28/2020 CLINICAL DATA:  Rectal pain and swelling for 1 week with dark stools, initial encounter EXAM: CT ABDOMEN AND PELVIS WITH CONTRAST TECHNIQUE: Multidetector CT imaging of the abdomen and pelvis was performed using the standard protocol following bolus administration of intravenous contrast. CONTRAST:  153mL OMNIPAQUE IOHEXOL 300 MG/ML  SOLN COMPARISON:  11/13/2019 FINDINGS: Lower chest: Emphysematous changes are noted in the lung bases with multiple bulla and blebs. Some associated nodular densities are noted stable in appearance from the prior exam. Hepatobiliary: No focal liver abnormality is seen. No gallstones, gallbladder wall thickening, or biliary dilatation. Pancreas: Pancreas is well visualized. There is a vague hypodensity identified in the body of the pancreas near the junction with the head suspicious for focal mass. No ductal dilatation is identified. The remainder of the pancreas is within normal limits. Spleen: Normal in size without focal abnormality. Adrenals/Urinary Tract: Adrenal glands are within normal limits. Kidneys demonstrate a normal enhancement  pattern bilaterally. Exophytic cyst is noted from the right kidney laterally. No obstructive changes are seen. The bladder is partially distended. Along the inferior margin of the bladder there is significant in growth of the prostate. Possibility of prostate mass lesion could not be totally excluded. Stomach/Bowel: The rectum is decompressed. Surrounding the rectum, there are multiple peripherally enhancing fluid attenuation areas the largest of which is noted to the right of the rectum measuring approximately 6.6 x 3.7 cm. These encircle the rectum and extends superiorly adjacent to the prostate as well as the inferior aspect of the bladder posteriorly. The more proximal colon shows fecal material within although no obstructive changes are seen. The appendix is unremarkable. Small bowel and stomach appear within normal limits. Vascular/Lymphatic: Aortic calcifications are noted without aneurysmal dilatation or dissection. No sizable lymphadenopathy is noted. Reproductive: Prostate is enlarged in size with ingrowth into the inferior aspect of the bladder. The possibility of a prostate mass could not be totally excluded. Direct  visualization is recommended when the patient's condition improves. The seminal vesicles are involved with the inflammatory change surrounding the rectum. Other: No free fluid is noted.  No hernia is seen. Musculoskeletal: No acute or significant osseous findings. IMPRESSION: Multifocal peripherally enhancing fluid collections consistent with abscesses surrounding the rectum. The largest of this area measures approximately 6.6 x 3.7 cm. Multiple smaller components are noted. This extends superiorly towards the seminal vesicle on the right as well as the inferior wall of the bladder and right lateral aspect of the prostate. Prominent prostate with ingrowth into the urinary bladder. This is likely related to prosthetic hypertrophy although the possibility of a prostate mass could not be totally  excluded. Direct visualization is recommended when the patient's condition improves. Vague hypodensity in the body of the pancreas as described. The possibility of an underlying mass deserves consideration. This can be evaluated on non emergent outpatient MRI to allow for optimum imaging when the patient's condition improves. Electronically Signed   By: Inez Catalina M.D.   On: 01/28/2020 21:56    Scheduled Meds: . Chlorhexidine Gluconate Cloth  6 each Topical Daily  . mupirocin ointment  1 application Nasal BID    Continuous Infusions: . cefTRIAXone (ROCEPHIN)  IV    . dextrose 5 % and 0.45% NaCl 50 mL/hr at 01/29/20 0859  . metronidazole 500 mg (01/29/20 0904)     LOS: 1 day     Kayleen Memos, MD [facility] Pager [phone]  If 7PM-7AM, please contact night-coverage [email or web address] Password [facility] 01/29/2020, 10:42 AM

## 2020-01-29 NOTE — Anesthesia Postprocedure Evaluation (Signed)
Anesthesia Post Note  Patient: Cody Arellano  Procedure(s) Performed: INCISION AND DRAINAGE BILATERAL PERIRECTAL ABSCESS (N/A Perineum)     Patient location during evaluation: PACU Anesthesia Type: General Level of consciousness: awake and alert, patient cooperative and oriented Pain management: pain level controlled Vital Signs Assessment: post-procedure vital signs reviewed and stable Respiratory status: spontaneous breathing, nonlabored ventilation and respiratory function stable Cardiovascular status: blood pressure returned to baseline and stable Postop Assessment: no apparent nausea or vomiting Anesthetic complications: no   No complications documented.  Last Vitals:  Vitals:   01/29/20 1402 01/29/20 1417  BP: 109/63 101/61  Pulse: 65 63  Resp: 19 18  Temp:    SpO2: 100% 92%    Last Pain:  Vitals:   01/29/20 1417  TempSrc:   PainSc: 0-No pain                 Marnae Madani,E. Raushanah Osmundson

## 2020-01-29 NOTE — ED Notes (Signed)
Phlebotomist Chapman Fitch made aware of need of blood collection.

## 2020-01-29 NOTE — Anesthesia Procedure Notes (Addendum)
Procedure Name: Intubation Date/Time: 01/29/2020 12:42 PM Performed by: Mariea Clonts, CRNA Pre-anesthesia Checklist: Patient identified, Emergency Drugs available, Suction available and Patient being monitored Patient Re-evaluated:Patient Re-evaluated prior to induction Oxygen Delivery Method: Circle System Utilized Preoxygenation: Pre-oxygenation with 100% oxygen Induction Type: IV induction Ventilation: Mask ventilation without difficulty Laryngoscope Size: Mac and 4 Grade View: Grade II Tube type: Oral Tube size: 7.5 mm Number of attempts: 1 Airway Equipment and Method: Stylet and Oral airway Placement Confirmation: ETT inserted through vocal cords under direct vision,  positive ETCO2 and breath sounds checked- equal and bilateral Tube secured with: Tape Dental Injury: Teeth and Oropharynx as per pre-operative assessment

## 2020-01-29 NOTE — Transfer of Care (Signed)
Immediate Anesthesia Transfer of Care Note  Patient: Cody Arellano  Procedure(s) Performed: INCISION AND DRAINAGE BILATERAL PERIRECTAL ABSCESS (N/A Perineum)  Patient Location: PACU  Anesthesia Type:General  Level of Consciousness: awake, alert  and oriented  Airway & Oxygen Therapy: Patient Spontanous Breathing, Patient connected to nasal cannula oxygen and Patient connected to face mask oxygen  Post-op Assessment: Report given to RN, Post -op Vital signs reviewed and stable and Patient moving all extremities X 4  Post vital signs: Reviewed and stable  Last Vitals:  Vitals Value Taken Time  BP 113/63 01/29/20 1347  Temp    Pulse 70 01/29/20 1353  Resp 23 01/29/20 1353  SpO2 87 % 01/29/20 1353  Vitals shown include unvalidated device data.  Last Pain:  Vitals:   01/29/20 1100  TempSrc:   PainSc: 8          Complications: No complications documented.

## 2020-01-29 NOTE — Progress Notes (Signed)
Report given to Sam RN in short stay.  The patient is ready for the OR

## 2020-01-29 NOTE — ED Notes (Signed)
x1 attempt  RN called 5N with no answer, this RN at to call floor again in 10 mintues

## 2020-01-29 NOTE — Op Note (Signed)
Incision and Drainage complex anterior horseshoe perirectal abscess  Pre-operative Diagnosis: bilateral perirectal abscess   Post-operative Diagnosis: same   Indications: perirectal pain, significant rim enhancing fluid collection bilaterally seen on CT.    Anesthesia: General   Procedure Details  The procedure, risks and complications have been discussed in detail (including, infection, bleeding, need for additional procedures) with the patient, and the patient has signed consent to the procedure. The patient was informed that the wound would be left open.  The patient was taken to OR 1 and general anesthesia was induced. The patient was placed into lithotomy position.  The skin was sterilely prepped and draped over the affected area in the usual fashion. Time out was performed according to the surgical safety checklist.   A syringe was used to test the location of the pus given the lack of induration, fluctuance, or erythema on the perirectal skin.  Pus was obtained at 2 o'clock (left superolateral) and 8 o'clock, right posterolateral.  Rectal exam and anoscopy was performed.  No pits were seen in the rectum and no purulent drainage was seen internally.  I&D with a #11 blade was performed on the right and left region.  Copious purulent drainage was obtained.  The cavity extended all the around the front of the rectum.  The cavity went posterior to the incision, but did not pass around the back of the rectum.  Cultures were sent. A small amount of additional skin was debrided around the opening.  The cavity was irrigated.  A 1/2 inch penrose was passed anterior to the rectum between the right and left incisions and secured with 2-0 nylon sutures.  The skin was then dressed with gauze, ABDs and mesh underwear.    The patient was awakened from anesthesia and taken to the PACU in stable condition. Needle, sponge, and instrument counts were correct x 2.    Findings:  Anterior horseshoe  abscess  EBL: min  Drains: None   Condition: Tolerated procedure well   Complications:  none known.

## 2020-01-29 NOTE — ED Notes (Signed)
5N Charge RN called with no answer, Charge Jessica RN made aware.

## 2020-01-29 NOTE — Plan of Care (Signed)

## 2020-01-29 NOTE — Anesthesia Preprocedure Evaluation (Addendum)
Anesthesia Evaluation  Patient identified by MRN, date of birth, ID band Patient awake    Reviewed: Allergy & Precautions, NPO status , Patient's Chart, lab work & pertinent test results  History of Anesthesia Complications Negative for: history of anesthetic complications  Airway Mallampati: II  TM Distance: >3 FB Neck ROM: Full    Dental  (+) Teeth Intact, Dental Advisory Given   Pulmonary former smoker,  01/28/2020 SARS coronavirus NEG   breath sounds clear to auscultation       Cardiovascular hypertension, Pt. on medications (-) angina+ CAD, + Past MI, + Cardiac Stents and + CABG  + dysrhythmias Atrial Fibrillation + Valvular Problems/Murmurs AS and MR  Rhythm:Irregular Rate:Normal  10/19/2019 ECHO: Left ventricular ejection fraction, by estimation, is 45 to 50%. LV has mildly decreased function. There is moderate hypokinesis of the  left ventricular, mid-apical septal  wall, anterolateral wall, anterior wall and apical segment.  2. Right ventricular systolic function is mildly reduced. RV size is normal.  3. The mitral valve is normal in structure. Mild mitral valve  regurgitation. No evidence of mitral stenosis.  4. The aortic valve is bicuspid. Aortic valve regurgitation is mild. Mild aortic valve stenosis.    Neuro/Psych  Headaches, Anxiety Depression    GI/Hepatic Neg liver ROS, Perirectal abscess   Endo/Other  lupus  Renal/GU negative Renal ROS     Musculoskeletal   Abdominal   Peds  Hematology  (+) Blood dyscrasia (Hb 9.5), anemia , Eliquis: last dose 01/27/2020   Anesthesia Other Findings   Reproductive/Obstetrics                            Anesthesia Physical Anesthesia Plan  ASA: III  Anesthesia Plan: General   Post-op Pain Management:    Induction: Intravenous  PONV Risk Score and Plan: 2 and Ondansetron and Treatment may vary due to age or medical  condition  Airway Management Planned: Oral ETT  Additional Equipment: None  Intra-op Plan:   Post-operative Plan: Extubation in OR  Informed Consent: I have reviewed the patients History and Physical, chart, labs and discussed the procedure including the risks, benefits and alternatives for the proposed anesthesia with the patient or authorized representative who has indicated his/her understanding and acceptance.     Dental advisory given  Plan Discussed with: CRNA and Surgeon  Anesthesia Plan Comments:        Anesthesia Quick Evaluation

## 2020-01-30 ENCOUNTER — Encounter (HOSPITAL_COMMUNITY): Payer: Self-pay | Admitting: General Surgery

## 2020-01-30 LAB — BASIC METABOLIC PANEL
Anion gap: 10 (ref 5–15)
BUN: 17 mg/dL (ref 8–23)
CO2: 22 mmol/L (ref 22–32)
Calcium: 8.6 mg/dL — ABNORMAL LOW (ref 8.9–10.3)
Chloride: 102 mmol/L (ref 98–111)
Creatinine, Ser: 0.93 mg/dL (ref 0.61–1.24)
GFR calc Af Amer: >60 mL/min (ref >60)
GFR calc non Af Amer: >60 mL/min (ref >60)
Glucose, Bld: 137 mg/dL — ABNORMAL HIGH (ref 70–99)
Potassium: 5.1 mmol/L (ref 3.5–5.1)
Sodium: 134 mmol/L — ABNORMAL LOW (ref 135–145)

## 2020-01-30 LAB — CBC WITH DIFFERENTIAL/PLATELET
Abs Immature Granulocytes: 0.12 10*3/uL — ABNORMAL HIGH (ref 0.00–0.07)
Basophils Absolute: 0 10*3/uL (ref 0.0–0.1)
Basophils Relative: 0 %
Eosinophils Absolute: 0 10*3/uL (ref 0.0–0.5)
Eosinophils Relative: 0 %
HCT: 31.2 % — ABNORMAL LOW (ref 39.0–52.0)
Hemoglobin: 9.6 g/dL — ABNORMAL LOW (ref 13.0–17.0)
Immature Granulocytes: 1 %
Lymphocytes Relative: 3 %
Lymphs Abs: 0.5 10*3/uL — ABNORMAL LOW (ref 0.7–4.0)
MCH: 27.7 pg (ref 26.0–34.0)
MCHC: 30.8 g/dL (ref 30.0–36.0)
MCV: 90.2 fL (ref 80.0–100.0)
Monocytes Absolute: 0.5 10*3/uL (ref 0.1–1.0)
Monocytes Relative: 3 %
Neutro Abs: 17 10*3/uL — ABNORMAL HIGH (ref 1.7–7.7)
Neutrophils Relative %: 93 %
Platelets: 301 10*3/uL (ref 150–400)
RBC: 3.46 MIL/uL — ABNORMAL LOW (ref 4.22–5.81)
RDW: 16.9 % — ABNORMAL HIGH (ref 11.5–15.5)
WBC: 18.1 10*3/uL — ABNORMAL HIGH (ref 4.0–10.5)
nRBC: 0 % (ref 0.0–0.2)

## 2020-01-30 LAB — CANCER ANTIGEN 19-9: CA 19-9: 36 U/mL — ABNORMAL HIGH (ref 0–35)

## 2020-01-30 MED ORDER — ONDANSETRON HCL 4 MG PO TABS
4.0000 mg | ORAL_TABLET | Freq: Four times a day (QID) | ORAL | Status: DC | PRN
  Administered 2020-01-30 – 2020-01-31 (×2): 4 mg via ORAL
  Filled 2020-01-30 (×2): qty 1

## 2020-01-30 NOTE — Progress Notes (Signed)
1 Day Post-Op  Subjective: CC: Patient is doing well. Pain at incisions well controlled on current pain medication regimen. Tolerating diet without n/v or abdominal pain. BM since surgery. Passing flatus. Has gotten oob in room to use the restroom. Has not mobilized in the halls. On o2. Does not use o2 at home. No SOB or CP. Lives at home with his wife.   Objective: Vital signs in last 24 hours: Temp:  [97.5 F (36.4 C)-98.5 F (36.9 C)] 98.4 F (36.9 C) (08/28 0739) Pulse Rate:  [59-85] 85 (08/28 0739) Resp:  [16-25] 17 (08/28 0739) BP: (95-113)/(60-80) 113/80 (08/28 0739) SpO2:  [92 %-100 %] 95 % (08/28 0739) Weight:  [69.9 kg] 69.9 kg (08/27 1145) Last BM Date: 01/28/20  Intake/Output from previous day: 08/27 0701 - 08/28 0700 In: 1867.1 [P.O.:240; I.V.:977.1; IV Piggyback:100] Out: 500 [Urine:500] Intake/Output this shift: No intake/output data recorded.  PE: Gen:  Alert, NAD, pleasant Card:  Reg Pulm:  CTAB, no W/R/R, effort normal Abd: Soft, NT/ND, +BS GU: B/l (left and right) perirectal incisions w/ penrose stitched in place. Purulent drainage on dressing. No drainage from wounds with palpation at the present. Periwound clean and without signs of cellulitis. No further areas of fluctuance, induration, erythema, or heat.  Ext:  No LE edema  Psych: A&Ox3  Skin: no rashes noted, warm and dry  Lab Results:  Recent Labs    01/29/20 0505 01/30/20 0321  WBC 20.2* 18.1*  HGB 9.5* 9.6*  HCT 30.8* 31.2*  PLT 316 301   BMET Recent Labs    01/29/20 0505 01/30/20 0321  NA 135 134*  K 4.4 5.1  CL 104 102  CO2 19* 22  GLUCOSE 76 137*  BUN 9 17  CREATININE 0.84 0.93  CALCIUM 8.4* 8.6*   PT/INR No results for input(s): LABPROT, INR in the last 72 hours. CMP     Component Value Date/Time   NA 134 (L) 01/30/2020 0321   NA 142 06/12/2018 1424   K 5.1 01/30/2020 0321   CL 102 01/30/2020 0321   CO2 22 01/30/2020 0321   GLUCOSE 137 (H) 01/30/2020 0321   BUN  17 01/30/2020 0321   BUN 8 06/12/2018 1424   CREATININE 0.93 01/30/2020 0321   CALCIUM 8.6 (L) 01/30/2020 0321   PROT 5.9 (L) 01/28/2020 0749   PROT 6.4 06/12/2018 1424   ALBUMIN 2.2 (L) 01/28/2020 0749   ALBUMIN 3.4 (L) 06/12/2018 1424   AST 17 01/28/2020 0749   ALT 20 01/28/2020 0749   ALKPHOS 44 01/28/2020 0749   BILITOT 0.3 01/28/2020 0749   BILITOT 0.3 06/12/2018 1424   GFRNONAA >60 01/30/2020 0321   GFRAA >60 01/30/2020 0321   Lipase  No results found for: LIPASE     Studies/Results: CT ABDOMEN PELVIS W CONTRAST  Result Date: 01/28/2020 CLINICAL DATA:  Rectal pain and swelling for 1 week with dark stools, initial encounter EXAM: CT ABDOMEN AND PELVIS WITH CONTRAST TECHNIQUE: Multidetector CT imaging of the abdomen and pelvis was performed using the standard protocol following bolus administration of intravenous contrast. CONTRAST:  165mL OMNIPAQUE IOHEXOL 300 MG/ML  SOLN COMPARISON:  11/13/2019 FINDINGS: Lower chest: Emphysematous changes are noted in the lung bases with multiple bulla and blebs. Some associated nodular densities are noted stable in appearance from the prior exam. Hepatobiliary: No focal liver abnormality is seen. No gallstones, gallbladder wall thickening, or biliary dilatation. Pancreas: Pancreas is well visualized. There is a vague hypodensity identified in the body of  the pancreas near the junction with the head suspicious for focal mass. No ductal dilatation is identified. The remainder of the pancreas is within normal limits. Spleen: Normal in size without focal abnormality. Adrenals/Urinary Tract: Adrenal glands are within normal limits. Kidneys demonstrate a normal enhancement pattern bilaterally. Exophytic cyst is noted from the right kidney laterally. No obstructive changes are seen. The bladder is partially distended. Along the inferior margin of the bladder there is significant in growth of the prostate. Possibility of prostate mass lesion could not be  totally excluded. Stomach/Bowel: The rectum is decompressed. Surrounding the rectum, there are multiple peripherally enhancing fluid attenuation areas the largest of which is noted to the right of the rectum measuring approximately 6.6 x 3.7 cm. These encircle the rectum and extends superiorly adjacent to the prostate as well as the inferior aspect of the bladder posteriorly. The more proximal colon shows fecal material within although no obstructive changes are seen. The appendix is unremarkable. Small bowel and stomach appear within normal limits. Vascular/Lymphatic: Aortic calcifications are noted without aneurysmal dilatation or dissection. No sizable lymphadenopathy is noted. Reproductive: Prostate is enlarged in size with ingrowth into the inferior aspect of the bladder. The possibility of a prostate mass could not be totally excluded. Direct visualization is recommended when the patient's condition improves. The seminal vesicles are involved with the inflammatory change surrounding the rectum. Other: No free fluid is noted.  No hernia is seen. Musculoskeletal: No acute or significant osseous findings. IMPRESSION: Multifocal peripherally enhancing fluid collections consistent with abscesses surrounding the rectum. The largest of this area measures approximately 6.6 x 3.7 cm. Multiple smaller components are noted. This extends superiorly towards the seminal vesicle on the right as well as the inferior wall of the bladder and right lateral aspect of the prostate. Prominent prostate with ingrowth into the urinary bladder. This is likely related to prosthetic hypertrophy although the possibility of a prostate mass could not be totally excluded. Direct visualization is recommended when the patient's condition improves. Vague hypodensity in the body of the pancreas as described. The possibility of an underlying mass deserves consideration. This can be evaluated on non emergent outpatient MRI to allow for optimum  imaging when the patient's condition improves. Electronically Signed   By: Inez Catalina M.D.   On: 01/28/2020 21:56    Anti-infectives: Anti-infectives (From admission, onward)   Start     Dose/Rate Route Frequency Ordered Stop   01/29/20 1800  cefTRIAXone (ROCEPHIN) 2 g in sodium chloride 0.9 % 100 mL IVPB        2 g 200 mL/hr over 30 Minutes Intravenous Every 24 hours 01/29/20 0643     01/29/20 0800  metroNIDAZOLE (FLAGYL) IVPB 500 mg        500 mg 100 mL/hr over 60 Minutes Intravenous Every 8 hours 01/28/20 2349     01/28/20 2230  metroNIDAZOLE (FLAGYL) IVPB 500 mg        500 mg 100 mL/hr over 60 Minutes Intravenous  Once 01/28/20 2223 01/29/20 0131   01/28/20 2230  cefTRIAXone (ROCEPHIN) 1 g in sodium chloride 0.9 % 100 mL IVPB  Status:  Discontinued        1 g 200 mL/hr over 30 Minutes Intravenous Every 24 hours 01/28/20 2223 01/29/20 0643       Assessment/Plan Complex anterior horseshoe perirectal abscess - s/p I&D with penrose drain placement, Dr. Barry Dienes, 01/29/2020 - POD #1 - Maintain penrose drain - Cont abx. Cx's w/ few gram neg rods and  gram positive cocci  - Sitz baths today - Mobilize - Wean o2, pulm toilet - Suspect patient will be able to d/c from our standpoint tomorrow   FEN - Reg VTE - SCDs, Lovenox  ID - Rocephin/Flagyl  Foley - None  Follow-Up - Dow    LOS: 2 days    Jillyn Ledger , [facility] 01/30/2020, 9:10 AM Please see Amion for pager number during day hours 7:00am-4:30pm

## 2020-01-30 NOTE — Plan of Care (Signed)
  Problem: Clinical Measurements: Goal: Ability to maintain clinical measurements within normal limits will improve Outcome: Progressing Goal: Will remain free from infection Outcome: Progressing Goal: Diagnostic test results will improve Outcome: Progressing Goal: Respiratory complications will improve Outcome: Progressing Goal: Cardiovascular complication will be avoided Outcome: Progressing   Problem: Activity: Goal: Risk for activity intolerance will decrease Outcome: Progressing   Problem: Nutrition: Goal: Adequate nutrition will be maintained Outcome: Progressing   Problem: Coping: Goal: Level of anxiety will decrease Outcome: Progressing   Problem: Elimination: Goal: Will not experience complications related to bowel motility Outcome: Progressing Goal: Will not experience complications related to urinary retention Outcome: Progressing   Problem: Pain Managment: Goal: General experience of comfort will improve Outcome: Progressing   Problem: Skin Integrity: Goal: Risk for impaired skin integrity will decrease Outcome: Progressing   Problem: Safety: Goal: Ability to remain free from injury will improve Outcome: Progressing

## 2020-01-30 NOTE — Progress Notes (Signed)
PROGRESS NOTE  Cody Arellano FWY:[id number] DOB: 18-Sep-1945 DOA: 01/28/2020 PCP: Venia Carbon, MD  HPI/Recap of past 24 hours: HPI: Cody Arellano is a 74 y.o. 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  He has noticed rectal pain for about 3 weeks but presented because it has been much worse this week.  It hurts constantly and is a 10 out of 10 pain with movement.  Pain decreases to about 5 out of 10 if he lays in a comfortable position.  Denies any pain with bowel movement but states that he has been taking stool softeners.  Denies bright red blood per rectum but has noticed dark stool for the past 10 days and does not take any iron supplementation.  He denies any abdominal pain, nausea or vomiting.  States he has been having less appetite since his CABG back in May and has lost about 20 pounds.  Denies current tobacco, alcohol illicit drug use.  CT abdomen and pelvis scan showed multifocal abscesses surrounding the rectum and there is also prominent prostate with ingrowth into the urinary bladder as well as a vague hypodensity in the body of the pancreas.  Patient does endorse that he frequently has dysuria and dribbling urinary stream.  General surgery has been consulted and will take patient to the OR for incision and drainage.  He is on Eliquis but last dose was on 8/25. FOBT negative.  01/30/20: Seen and examined.  Feels better this morning post perirectal I&D on 01/29/2020.    Assessment/Plan: Principal Problem:   Rectal abscess Active Problems:   CAD (coronary artery disease)   Hyperlipemia   Paroxysmal atrial fibrillation (HCC)   Hypotension   Anemia   Prostatic hypertrophy   Pancreatic lesion  Sepsis 2/2 to Rectal abscesses post I&D on 01/29/2020 by Dr. Barry Dienes. Presented with  leukocytosis wbc 16.6K and RR 26 and rectal abscesses seen on CT scan Orthopedic surgery following Pain control in place and bowel regimen  Resolved hypotension Hold off antihypertensives since BP are soft Hold off IV fluids to avoid volume overload Continue to monitor vital signs  Iron deficiency anemia Hemoglobin of 10.1 worsening from 12.9 several months prior Iron studies suggestive of iron deficiency FOBT negative Hold off IV Feraheme infusion for now due to sepsis Continue p.o. ferrous sulfate 325 mg daily, laxative and stool softener  Prostatic hypertrophy Prostate cancer cannot be excluded on CT.  Patient also symptomatic with dribbling urine stream and has history of BPH. Monitor urine output  Hypodensity on head of pancreas Seen on CT abdomen and pelvis Need to repeat imaging outpatient  CAD s/p CABG Asymptomatic.  Continue aspirin and Crestor.  Postoperative atrial fibrillation Currently in sinus rhythm.  Per cardiology documentation in 11/2019 there is plans to discontinue his Eliquis and amiodarone in 3 months if he remains in sinus rhythm.  Chronic combined systolic and diastolic heart failure EF of 45 to 50% on echo in 10/2019.   Euvolemic on exam Of IV fluid Cardiac medications on hold due to soft blood pressures Strict I's and O and daily weight  Hyperlipidemia Continue statin  DVT prophylaxis: SCDs, subcu Lovenox daily Code Status: Full Family Communication: Plan discussed with patient at bedside   Consults called: General surgery Admission status: inpatient Status is: Inpatient  Remains inpatient appropriate because:Inpatient level of care appropriate due to  severity of illness   Dispo: The patient is from: Home  Anticipated d/c is to: Home  Anticipated d/c date is: 02/01/2020  Patient currently is not medically stable to d/c due to ongoing treatment for sepsis.     Objective: Vitals:    01/29/20 2100 01/30/20 0352 01/30/20 0739 01/30/20 1339  BP: 97/72 111/74 113/80 119/84  Pulse: 60 63 85 82  Resp: 20 18 17 17   Temp: 98.5 F (36.9 C) 98.3 F (36.8 C) 98.4 F (36.9 C) 98.2 F (36.8 C)  TempSrc: Oral Oral Oral Oral  SpO2: 96% 93% 95% 94%  Weight:      Height:        Intake/Output Summary (Last 24 hours) at 01/30/2020 1831 Last data filed at 01/30/2020 1700 Gross per 24 hour  Intake 480 ml  Output 950 ml  Net -470 ml   Filed Weights   01/29/20 1145  Weight: 69.9 kg    Exam:  . General: 74 y.o. year-old male pleasant well-developed well-nourished in no acute distress.  Alert and oriented x3.   . Cardiovascular: Regular rate and rhythm no rubs or gallops. [name] Respiratory: Clear to auscultation no wheezes or rales.   . Abdomen: Soft nontender normal bowel sounds present.   . Musculoskeletal: No lower extremity edema bilaterally.   [name] Psychiatry: Mood is appropriate for condition and setting.   Data Reviewed: CBC: Recent Labs  Lab 01/28/20 0749 01/29/20 0505 01/30/20 0321  WBC 16.6* 20.2* 18.1*  NEUTROABS 14.9*  --  17.0*  HGB 10.1* 9.5* 9.6*  HCT 33.9* 30.8* 31.2*  MCV 93.6 92.8 90.2  PLT 335 316 664   Basic Metabolic Panel: Recent Labs  Lab 01/28/20 0749 01/29/20 0505 01/30/20 0321  NA 135 135 134*  K 4.5 4.4 5.1  CL 103 104 102  CO2 22 19* 22  GLUCOSE 129* 76 137*  BUN 10 9 17   CREATININE 0.99 0.84 0.93  CALCIUM 8.5* 8.4* 8.6*   GFR: Estimated Creatinine Clearance: 68.9 mL/min (by C-G formula based on SCr of 0.93 mg/dL). Liver Function Tests: Recent Labs  Lab 01/28/20 0749  AST 17  ALT 20  ALKPHOS 44  BILITOT 0.3  PROT 5.9*  ALBUMIN 2.2*   No results for input(s): LIPASE, AMYLASE in the last 168 hours. No results for input(s): AMMONIA in the last 168 hours. Coagulation Profile: No results for input(s): INR, PROTIME in the last 168 hours. Cardiac Enzymes: No results for input(s): CKTOTAL, CKMB, CKMBINDEX, TROPONINI in the  last 168 hours. BNP (last 3 results) No results for input(s): PROBNP in the last 8760 hours. HbA1C: No results for input(s): HGBA1C in the last 72 hours. CBG: No results for input(s): GLUCAP in the last 168 hours. Lipid Profile: No results for input(s): CHOL, HDL, LDLCALC, TRIG, CHOLHDL, LDLDIRECT in the last 72 hours. Thyroid Function Tests: No results for input(s): TSH, T4TOTAL, FREET4, T3FREE, THYROIDAB in the last 72 hours. Anemia Panel: Recent Labs    01/29/20 0505  VITAMINB12 340  FOLATE 7.9  TIBC 186*  IRON 10*   Urine analysis:    Component Value Date/Time   COLORURINE YELLOW 01/29/2020 0524   APPEARANCEUR CLEAR 01/29/2020 0524   LABSPEC >1.046 (H) 01/29/2020 0524   PHURINE 5.0 01/29/2020 0524   GLUCOSEU NEGATIVE 01/29/2020 0524   HGBUR SMALL (A) 01/29/2020 0524   BILIRUBINUR NEGATIVE 01/29/2020 0524   KETONESUR 5 (A) 01/29/2020 0524   PROTEINUR NEGATIVE 01/29/2020 0524   UROBILINOGEN 2.0 (H) 02/04/2013 0200   NITRITE  POSITIVE (A) 01/29/2020 0524   LEUKOCYTESUR SMALL (A) 01/29/2020 0524   Sepsis Labs: @LABRCNTIP (procalcitonin:4,lacticidven:4)  ) Recent Results (from the past 240 hour(s))  SARS Coronavirus 2 by RT PCR (hospital order, performed in [facility] hospital lab) Nasopharyngeal Nasopharyngeal Swab     Status: None   Collection Time: 01/28/20 11:00 PM   Specimen: Nasopharyngeal Swab  Result Value Ref Range Status   SARS Coronavirus 2 NEGATIVE NEGATIVE Final    Comment: (NOTE) SARS-CoV-2 target nucleic acids are NOT DETECTED.  The SARS-CoV-2 RNA is generally detectable in upper and lower respiratory specimens during the acute phase of infection. The lowest concentration of SARS-CoV-2 viral copies this assay can detect is 250 copies / mL. A negative result does not preclude SARS-CoV-2 infection and should not be used as the sole basis for treatment or other patient management decisions.  A negative result may occur with improper specimen collection  / handling, submission of specimen other than nasopharyngeal swab, presence of viral mutation(s) within the areas targeted by this assay, and inadequate number of viral copies (<250 copies / mL). A negative result must be combined with clinical observations, patient history, and epidemiological information.  Fact Sheet for Patients:   StrictlyIdeas.no  Fact Sheet for Healthcare Providers: BankingDealers.co.za  This test is not yet approved or  cleared by the [place] FDA and has been authorized for detection and/or diagnosis of SARS-CoV-2 by FDA under an Emergency Use Authorization (EUA).  This EUA will remain in effect (meaning this test can be used) for the duration of the COVID-19 declaration under Section 564(b)(1) of the Act, 21 U.S.C. section 360bbb-3(b)(1), unless the authorization is terminated or revoked sooner.  Performed at [facility], [place] [address], [place], [place] [address]   Surgical PCR screen     Status: None   Collection Time: 01/29/20  8:40 AM   Specimen: Nasal Mucosa; Nasal Swab  Result Value Ref Range Status   MRSA, PCR NEGATIVE NEGATIVE Final   Staphylococcus aureus NEGATIVE NEGATIVE Final    Comment: (NOTE) The Xpert SA Assay (FDA approved for NASAL specimens in patients 74 years of age and older), is one component of a comprehensive surveillance program. It is not intended to diagnose infection nor to guide or monitor treatment. Performed at [facility], [place] [address], [place], [place] [address]   Aerobic/Anaerobic Culture (surgical/deep wound)     Status: None (Preliminary result)   Collection Time: 01/29/20  1:08 PM   Specimen: Abscess  Result Value Ref Range Status   Specimen Description ABSCESS  Final   Special Requests NONE  Final   Gram Stain   Final    MODERATE WBC PRESENT, PREDOMINANTLY PMN FEW GRAM NEGATIVE RODS RARE GRAM POSITIVE COCCI    Culture   Final    MODERATE  ESCHERICHIA COLI SUSCEPTIBILITIES TO FOLLOW Performed at [facility], [place] [address], [place], [place] [address]    Report Status PENDING  Incomplete      Studies: No results found.  Scheduled Meds: . aspirin EC  81 mg Oral Daily  . Chlorhexidine Gluconate Cloth  6 each Topical Daily  . enoxaparin (LOVENOX) injection  40 mg Subcutaneous Q24H  . feeding supplement  237 mL Oral BID BM  . ferrous sulfate  325 mg Oral Q breakfast  . mupirocin ointment  1 application Nasal BID  . polyethylene glycol  17 g Oral BID  . rosuvastatin  40 mg Oral Daily  . senna-docusate  2 tablet Oral BID  . tamsulosin  0.4 mg Oral QPM    Continuous Infusions: . cefTRIAXone (ROCEPHIN)  IV 2 g (01/29/20 1900)  . metronidazole 500 mg (01/30/20 1807)     LOS: 2 days     Kayleen Memos, MD [facility] Pager [phone]  If 7PM-7AM, please contact night-coverage [email or web address] Password [facility] 01/30/2020, 6:31 PM

## 2020-01-31 LAB — CBC WITH DIFFERENTIAL/PLATELET
Abs Immature Granulocytes: 0.14 10*3/uL — ABNORMAL HIGH (ref 0.00–0.07)
Basophils Absolute: 0 10*3/uL (ref 0.0–0.1)
Basophils Relative: 0 %
Eosinophils Absolute: 0 10*3/uL (ref 0.0–0.5)
Eosinophils Relative: 0 %
HCT: 31.1 % — ABNORMAL LOW (ref 39.0–52.0)
Hemoglobin: 9.8 g/dL — ABNORMAL LOW (ref 13.0–17.0)
Immature Granulocytes: 1 %
Lymphocytes Relative: 6 %
Lymphs Abs: 1.2 10*3/uL (ref 0.7–4.0)
MCH: 28.4 pg (ref 26.0–34.0)
MCHC: 31.5 g/dL (ref 30.0–36.0)
MCV: 90.1 fL (ref 80.0–100.0)
Monocytes Absolute: 1.1 10*3/uL — ABNORMAL HIGH (ref 0.1–1.0)
Monocytes Relative: 6 %
Neutro Abs: 16.3 10*3/uL — ABNORMAL HIGH (ref 1.7–7.7)
Neutrophils Relative %: 87 %
Platelets: 309 10*3/uL (ref 150–400)
RBC: 3.45 MIL/uL — ABNORMAL LOW (ref 4.22–5.81)
RDW: 17.1 % — ABNORMAL HIGH (ref 11.5–15.5)
WBC: 18.8 10*3/uL — ABNORMAL HIGH (ref 4.0–10.5)
nRBC: 0 % (ref 0.0–0.2)

## 2020-01-31 MED ORDER — POLYVINYL ALCOHOL 1.4 % OP SOLN
1.0000 [drp] | Freq: Every day | OPHTHALMIC | Status: DC
  Administered 2020-01-31 – 2020-02-02 (×2): 1 [drp] via OPHTHALMIC
  Filled 2020-01-31: qty 15

## 2020-01-31 MED ORDER — APIXABAN 2.5 MG PO TABS
2.5000 mg | ORAL_TABLET | Freq: Two times a day (BID) | ORAL | Status: DC
  Administered 2020-01-31 – 2020-02-01 (×2): 2.5 mg via ORAL
  Filled 2020-01-31 (×2): qty 1

## 2020-01-31 MED ORDER — FUROSEMIDE 10 MG/ML IJ SOLN
20.0000 mg | Freq: Three times a day (TID) | INTRAMUSCULAR | Status: DC
  Administered 2020-01-31 – 2020-02-01 (×3): 20 mg via INTRAVENOUS
  Filled 2020-01-31 (×3): qty 2

## 2020-01-31 MED ORDER — PROCHLORPERAZINE EDISYLATE 10 MG/2ML IJ SOLN
10.0000 mg | Freq: Four times a day (QID) | INTRAMUSCULAR | Status: DC | PRN
  Administered 2020-01-31: 10 mg via INTRAVENOUS
  Filled 2020-01-31: qty 2

## 2020-01-31 NOTE — Progress Notes (Signed)
PT Cancellation Note  Patient Details Name: KOBEN DAMAN MRN: [id number] DOB: [birth year]   Cancelled Treatment:    Reason Eval/Treat Not Completed: Patient declined, no reason specified. Feeling a little nauseated, just received zofran. Will check back later.     Emrik Erhard 01/31/2020, 11:52 AM

## 2020-01-31 NOTE — Plan of Care (Signed)

## 2020-01-31 NOTE — Progress Notes (Signed)
PROGRESS NOTE  Cody Arellano:[id number] DOB: Nov 03, 1945 DOA: 01/28/2020 PCP: Venia Carbon, MD  HPI/Recap of past 24 hours: HPI: Cody Arellano is a 74 y.o. 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  He has noticed rectal pain for about 3 weeks but presented because it has been much worse this week.  It hurts constantly and is a 10 out of 10 pain with movement.  Pain decreases to about 5 out of 10 if he lays in a comfortable position.  Denies any pain with bowel movement but states that he has been taking stool softeners.  Denies bright red blood per rectum but has noticed dark stool for the past 10 days and does not take any iron supplementation.  He denies any abdominal pain, nausea or vomiting.  States he has been having less appetite since his CABG back in May and has lost about 20 pounds.  Denies current tobacco, alcohol illicit drug use.  CT abdomen and pelvis scan showed multifocal abscesses surrounding the rectum and there is also prominent prostate with ingrowth into the urinary bladder as well as a vague hypodensity in the body of the pancreas.  Patient does endorse that he frequently has dysuria and dribbling urinary stream.  General surgery has been consulted and will take patient to the OR for incision and drainage.  He is on Eliquis but last dose was on 8/25. FOBT negative.  01/31/20: Seen and examined.  Reports increased perirectal pain.  Persistent leukocytosis with WBC 18 K.  Will continue IV antibiotics.  Deep wound culture showed E. coli, awaiting sensitivities.    Assessment/Plan: Principal Problem:   Rectal abscess Active Problems:   CAD (coronary artery disease)   Hyperlipemia   Paroxysmal atrial fibrillation (HCC)   Hypotension   Anemia   Prostatic hypertrophy    Pancreatic lesion  Sepsis 2/2 to Rectal abscesses post I&D on 01/29/2020 by Dr. Barry Dienes. Presented with leukocytosis wbc 16.6K and RR 26 and rectal abscesses seen on CT scan Orthopedic surgery following Pain control in place and bowel regimen Persistent leukocytosis with WBC 18 K. Deep wound culture showed E. coli, awaiting sensitivities. Continue empiric IV antibiotics, on Rocephin and IV Flagyl. Repeat CBC with differentials in the morning.  Intermittent A. fib with RVR in the setting of paroxysmal A. Fib Will obtain a twelve-lead EKG Resume Eliquis if okay with surgery Home amiodarone on hold due to soft blood pressures Restart amiodarone with improvement of BP.  History of lupus Immunosuppressants on hold due to active infective process Will resume once WBC is improved  Resolved hypotension Hold off antihypertensives since BP are soft  Iron deficiency anemia Hemoglobin of 10.1 worsening from 12.9 several months prior Iron studies suggestive of iron deficiency FOBT negative Hold off IV Feraheme infusion for now due to sepsis Continue p.o. ferrous sulfate 325 mg daily, laxative and stool softener  Prostatic hypertrophy Prostate cancer cannot be excluded on CT.  Patient also symptomatic with dribbling urine stream and has history of BPH. Continue tamsulosin 1.0 L urine output recorded in the last 24 hours.  Hypodensity on head of pancreas Seen on CT abdomen and pelvis Need to repeat imaging outpatient  CAD s/p CABG Asymptomatic.   Continue aspirin and Crestor.  Postoperative atrial fibrillation Currently in sinus rhythm.  Per cardiology documentation in 11/2019 there is plans to  discontinue his Eliquis and amiodarone in 3 months if he remains in sinus rhythm.  Chronic combined systolic and diastolic heart failure EF of 45 to 50% on echo in 10/2019.   Started gentle diuresing IV lasix 20 mg TID Strict I's and O and daily weight Net I&O  +1.8L  Hyperlipidemia Continue statin  DVT prophylaxis: Subcu Lovenox daily Code Status: Full Family Communication: Plan discussed with patient at bedside   Consults called: General surgery Admission status: inpatient Status is: Inpatient  Remains inpatient appropriate because:Inpatient level of care appropriate due to severity of illness   Dispo: The patient is from: Home  Anticipated d/c is to: Home  Anticipated d/c date is: 02/02/2020  Patient currently is not medically stable to d/c due to ongoing treatment for sepsis.     Objective: Vitals:   01/30/20 1339 01/30/20 2018 01/31/20 0433 01/31/20 0734  BP: 119/84 107/73 110/85 112/83  Pulse: 82 62 62 65  Resp: 17 17 17 17   Temp: 98.2 F (36.8 C) 97.6 F (36.4 C) 98.2 F (36.8 C) 98.3 F (36.8 C)  TempSrc: Oral Oral Oral Oral  SpO2: 94% 99% 98% 98%  Weight:      Height:        Intake/Output Summary (Last 24 hours) at 01/31/2020 1155 Last data filed at 01/31/2020 1137 Gross per 24 hour  Intake 780 ml  Output 1450 ml  Net -670 ml   Filed Weights   01/29/20 1145  Weight: 69.9 kg    Exam:  . General: 74 y.o. year-old male pleasant in no acute distress.  Alert and oriented x3.   . Cardiovascular: Regular rate and rhythm no rubs or gallops.   [name] Respiratory: Mild rales at bases no wheezing noted.   . Abdomen: Soft nontender normal bowel sounds present. . Musculoskeletal: No lower extremity edema bilaterally.   [name] Psychiatry: Mood is appropriate for condition and setting.   Data Reviewed: CBC: Recent Labs  Lab 01/28/20 0749 01/29/20 0505 01/30/20 0321 01/31/20 0155  WBC 16.6* 20.2* 18.1* 18.8*  NEUTROABS 14.9*  --  17.0* 16.3*  HGB 10.1* 9.5* 9.6* 9.8*  HCT 33.9* 30.8* 31.2* 31.1*  MCV 93.6 92.8 90.2 90.1  PLT 335 316 301 416   Basic Metabolic Panel: Recent Labs  Lab 01/28/20 0749 01/29/20 0505 01/30/20 0321  NA 135 135 134*  K 4.5 4.4 5.1  CL 103 104  102  CO2 22 19* 22  GLUCOSE 129* 76 137*  BUN 10 9 17   CREATININE 0.99 0.84 0.93  CALCIUM 8.5* 8.4* 8.6*   GFR: Estimated Creatinine Clearance: 68.9 mL/min (by C-G formula based on SCr of 0.93 mg/dL). Liver Function Tests: Recent Labs  Lab 01/28/20 0749  AST 17  ALT 20  ALKPHOS 44  BILITOT 0.3  PROT 5.9*  ALBUMIN 2.2*   No results for input(s): LIPASE, AMYLASE in the last 168 hours. No results for input(s): AMMONIA in the last 168 hours. Coagulation Profile: No results for input(s): INR, PROTIME in the last 168 hours. Cardiac Enzymes: No results for input(s): CKTOTAL, CKMB, CKMBINDEX, TROPONINI in the last 168 hours. BNP (last 3 results) No results for input(s): PROBNP in the last 8760 hours. HbA1C: No results for input(s): HGBA1C in the last 72 hours. CBG: No results for input(s): GLUCAP in the last 168 hours. Lipid Profile: No results for input(s): CHOL, HDL, LDLCALC, TRIG, CHOLHDL, LDLDIRECT in the last 72 hours. Thyroid Function Tests: No results for input(s): TSH, T4TOTAL, FREET4, T3FREE, THYROIDAB in the  last 72 hours. Anemia Panel: Recent Labs    01/29/20 0505  VITAMINB12 340  FOLATE 7.9  TIBC 186*  IRON 10*   Urine analysis:    Component Value Date/Time   COLORURINE YELLOW 01/29/2020 0524   APPEARANCEUR CLEAR 01/29/2020 0524   LABSPEC >1.046 (H) 01/29/2020 0524   PHURINE 5.0 01/29/2020 0524   GLUCOSEU NEGATIVE 01/29/2020 0524   HGBUR SMALL (A) 01/29/2020 0524   BILIRUBINUR NEGATIVE 01/29/2020 0524   KETONESUR 5 (A) 01/29/2020 0524   PROTEINUR NEGATIVE 01/29/2020 0524   UROBILINOGEN 2.0 (H) 02/04/2013 0200   NITRITE POSITIVE (A) 01/29/2020 0524   LEUKOCYTESUR SMALL (A) 01/29/2020 0524   Sepsis Labs: @LABRCNTIP (procalcitonin:4,lacticidven:4)  ) Recent Results (from the past 240 hour(s))  SARS Coronavirus 2 by RT PCR (hospital order, performed in [place] hospital lab) Nasopharyngeal Nasopharyngeal Swab     Status: None   Collection Time:  01/28/20 11:00 PM   Specimen: Nasopharyngeal Swab  Result Value Ref Range Status   SARS Coronavirus 2 NEGATIVE NEGATIVE Final    Comment: (NOTE) SARS-CoV-2 target nucleic acids are NOT DETECTED.  The SARS-CoV-2 RNA is generally detectable in upper and lower respiratory specimens during the acute phase of infection. The lowest concentration of SARS-CoV-2 viral copies this assay can detect is 250 copies / mL. A negative result does not preclude SARS-CoV-2 infection and should not be used as the sole basis for treatment or other patient management decisions.  A negative result may occur with improper specimen collection / handling, submission of specimen other than nasopharyngeal swab, presence of viral mutation(s) within the areas targeted by this assay, and inadequate number of viral copies (<250 copies / mL). A negative result must be combined with clinical observations, patient history, and epidemiological information.  Fact Sheet for Patients:   StrictlyIdeas.no  Fact Sheet for Healthcare Providers: BankingDealers.co.za  This test is not yet approved or  cleared by the [place] FDA and has been authorized for detection and/or diagnosis of SARS-CoV-2 by FDA under an Emergency Use Authorization (EUA).  This EUA will remain in effect (meaning this test can be used) for the duration of the COVID-19 declaration under Section 564(b)(1) of the Act, 21 U.S.C. section 360bbb-3(b)(1), unless the authorization is terminated or revoked sooner.  Performed at [facility], [place] [address] [place], [place] [address]   Surgical PCR screen     Status: None   Collection Time: 01/29/20  8:40 AM   Specimen: Nasal Mucosa; Nasal Swab  Result Value Ref Range Status   MRSA, PCR NEGATIVE NEGATIVE Final   Staphylococcus aureus NEGATIVE NEGATIVE Final    Comment: (NOTE) The Xpert SA Assay (FDA approved for NASAL specimens in patients  56 years of age and older), is one component of a comprehensive surveillance program. It is not intended to diagnose infection nor to guide or monitor treatment. Performed at [facility], [place] [address] [place], [place] [address]   Aerobic/Anaerobic Culture (surgical/deep wound)     Status: None (Preliminary result)   Collection Time: 01/29/20  1:08 PM   Specimen: Abscess  Result Value Ref Range Status   Specimen Description ABSCESS  Final   Special Requests NONE  Final   Gram Stain   Final    MODERATE WBC PRESENT, PREDOMINANTLY PMN FEW GRAM NEGATIVE RODS RARE GRAM POSITIVE COCCI Performed at [facility], [place] [address], [place], [place] [address]    Culture   Final    MODERATE ESCHERICHIA COLI  NO ANAEROBES ISOLATED; CULTURE IN PROGRESS FOR 5 DAYS    Report Status PENDING  Incomplete   Organism ID, Bacteria ESCHERICHIA COLI  Final      Susceptibility   Escherichia coli - MIC*    AMPICILLIN >=32 RESISTANT Resistant     CEFAZOLIN >=64 RESISTANT Resistant     CEFEPIME <=0.12 SENSITIVE Sensitive     CEFTAZIDIME <=1 SENSITIVE Sensitive     CEFTRIAXONE 0.5 SENSITIVE Sensitive     CIPROFLOXACIN <=0.25 SENSITIVE Sensitive     GENTAMICIN <=1 SENSITIVE Sensitive     IMIPENEM <=0.25 SENSITIVE Sensitive     TRIMETH/SULFA <=20 SENSITIVE Sensitive     AMPICILLIN/SULBACTAM >=32 RESISTANT Resistant     PIP/TAZO <=4 SENSITIVE Sensitive     * MODERATE ESCHERICHIA COLI      Studies: No results found.  Scheduled Meds: . aspirin EC  81 mg Oral Daily  . Chlorhexidine Gluconate Cloth  6 each Topical Daily  . enoxaparin (LOVENOX) injection  40 mg Subcutaneous Q24H  . feeding supplement  237 mL Oral BID BM  . ferrous sulfate  325 mg Oral Q breakfast  . furosemide  20 mg Intravenous TID  . mupirocin ointment  1 application Nasal BID  . polyethylene glycol  17 g Oral BID  . rosuvastatin  40 mg Oral Daily  . senna-docusate  2 tablet Oral BID  . tamsulosin  0.4 mg Oral QPM     Continuous Infusions: . cefTRIAXone (ROCEPHIN)  IV 2 g (01/30/20 1908)  . metronidazole 500 mg (01/31/20 0925)     LOS: 3 days     Kayleen Memos, MD [facility] Pager [phone]  If 7PM-7AM, please contact night-coverage [email or web address] Password [facility] 01/31/2020, 11:55 AM

## 2020-01-31 NOTE — Progress Notes (Signed)
2 Days Post-Op  Subjective: CC: Pain controlled.  However, pt had n/v this AM.  Has been constipated.    Objective: Vital signs in last 24 hours: Temp:  [97.6 F (36.4 C)-98.3 F (36.8 C)] 98.3 F (36.8 C) (08/29 0734) Pulse Rate:  [62-82] 65 (08/29 0734) Resp:  [17] 17 (08/29 0734) BP: (107-119)/(73-85) 112/83 (08/29 0734) SpO2:  [94 %-99 %] 98 % (08/29 0734) Last BM Date: 01/30/20  Intake/Output from previous day: 08/28 0701 - 08/29 0700 In: 24 [P.O.:480; IV Piggyback:300] Out: 650 [Urine:650] Intake/Output this shift: Total I/O In: 120 [P.O.:120] Out: 1000 [Urine:1000]  PE: Gen:  Alert, NAD, pleasant Pulm:  Breathing comfortably Abd: Soft, NT/ND GU: B/l (left and right) perirectal incisions w/ penrose stitched in place. Less purulent material on dressing, but stool present.   Ext:  No LE edema  Psych: A&Ox3  Skin: no rashes noted, warm and dry  Lab Results:  Recent Labs    01/30/20 0321 01/31/20 0155  WBC 18.1* 18.8*  HGB 9.6* 9.8*  HCT 31.2* 31.1*  PLT 301 309   BMET Recent Labs    01/29/20 0505 01/30/20 0321  NA 135 134*  K 4.4 5.1  CL 104 102  CO2 19* 22  GLUCOSE 76 137*  BUN 9 17  CREATININE 0.84 0.93  CALCIUM 8.4* 8.6*   PT/INR No results for input(s): LABPROT, INR in the last 72 hours. CMP     Component Value Date/Time   NA 134 (L) 01/30/2020 0321   NA 142 06/12/2018 1424   K 5.1 01/30/2020 0321   CL 102 01/30/2020 0321   CO2 22 01/30/2020 0321   GLUCOSE 137 (H) 01/30/2020 0321   BUN 17 01/30/2020 0321   BUN 8 06/12/2018 1424   CREATININE 0.93 01/30/2020 0321   CALCIUM 8.6 (L) 01/30/2020 0321   PROT 5.9 (L) 01/28/2020 0749   PROT 6.4 06/12/2018 1424   ALBUMIN 2.2 (L) 01/28/2020 0749   ALBUMIN 3.4 (L) 06/12/2018 1424   AST 17 01/28/2020 0749   ALT 20 01/28/2020 0749   ALKPHOS 44 01/28/2020 0749   BILITOT 0.3 01/28/2020 0749   BILITOT 0.3 06/12/2018 1424   GFRNONAA >60 01/30/2020 0321   GFRAA >60 01/30/2020 0321    Lipase  No results found for: LIPASE     Studies/Results: No results found.  Anti-infectives: Anti-infectives (From admission, onward)   Start     Dose/Rate Route Frequency Ordered Stop   01/29/20 1800  cefTRIAXone (ROCEPHIN) 2 g in sodium chloride 0.9 % 100 mL IVPB        2 g 200 mL/hr over 30 Minutes Intravenous Every 24 hours 01/29/20 0643     01/29/20 0800  metroNIDAZOLE (FLAGYL) IVPB 500 mg        500 mg 100 mL/hr over 60 Minutes Intravenous Every 8 hours 01/28/20 2349     01/28/20 2230  metroNIDAZOLE (FLAGYL) IVPB 500 mg        500 mg 100 mL/hr over 60 Minutes Intravenous  Once 01/28/20 2223 01/29/20 0131   01/28/20 2230  cefTRIAXone (ROCEPHIN) 1 g in sodium chloride 0.9 % 100 mL IVPB  Status:  Discontinued        1 g 200 mL/hr over 30 Minutes Intravenous Every 24 hours 01/28/20 2223 01/29/20 0643       Assessment/Plan Complex anterior horseshoe perirectal abscess - s/p I&D with penrose drain placement, Dr. Barry Dienes, 01/29/2020 - POD #2 - Maintain penrose drain - Cont abx. Cx's  E coli - Sitz baths today - Mobilize - Wean o2, pulm toilet - continue sitz baths and pain control as needed.    FEN - Reg VTE - SCDs, Lovenox  ID - Rocephin/Flagyl  Foley - None  Follow-Up - Dow  Plan to d/c with penrose drain and leave in for 2 weeks.     LOS: 3 days    Milus Height, MD FACS Surgical Oncology, General Surgery, Trauma and [facility], [place] for weekday/non holidays Check [email or web address] for coverage night/weekend/holidays  Do not use SecureChat as it is not reliable for timely patient care.

## 2020-01-31 NOTE — Evaluation (Signed)
Physical Therapy Evaluation Patient Details Name: Cody Arellano MRN: [id number] DOB: March 24, 1946 Today's Date: 01/31/2020   History of Present Illness  Cody Arellano is a 74 y.o. 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  Clinical Impression  Patient received in bed, wants to walk. Reports he has been to bathroom, but that is it. Appears frail. Son present. He is mod independent with bed mobility. Cues and min assist needed for sit to stand transfer from low bed. Min guard for ambulation of 200 feet with rw. Ambulated on room air, O2 saturations when returned from walk at 97% on room air. Left off nasal canula. RN notified. He will continue to benefit from skilled PT while here to improve strength, activity tolerance and independence.      Follow Up Recommendations Home health PT    Equipment Recommendations  Rolling walker with 5" wheels    Recommendations for Other Services       Precautions / Restrictions Precautions Precautions: Fall Precaution Comments: low fall Restrictions Weight Bearing Restrictions: No      Mobility  Bed Mobility Overal bed mobility: Modified Independent             General bed mobility comments: increased time/effort  Transfers Overall transfer level: Needs assistance Equipment used: Rolling walker (2 wheeled) Transfers: Sit to/from Stand Sit to Stand: Min guard         General transfer comment: cues for hand palcement  Ambulation/Gait Ambulation/Gait assistance: Min guard Gait Distance (Feet): 200 Feet Assistive device: Rolling walker (2 wheeled) Gait Pattern/deviations: Step-through pattern;Decreased stride length Gait velocity: decreased   General Gait Details: generally steady with gait.  Stairs            Wheelchair Mobility     Modified Rankin (Stroke Patients Only)       Balance Overall balance assessment: Modified Independent                                           Pertinent Vitals/Pain Pain Assessment: Faces Faces Pain Scale: Hurts little more Pain Location: bottom Pain Descriptors / Indicators: Discomfort;Sore Pain Intervention(s): Patient requesting pain meds-RN notified    Home Living Family/patient expects to be discharged to:: Private residence Living Arrangements: Spouse/significant other Available Help at Discharge: Family;Available 24 hours/day Type of Home: House Home Access: Stairs to enter   [employer] of Steps: 1 Home Layout: Two level;Bed/bath upstairs Home Equipment: None      Prior Function Level of Independence: Independent               Hand Dominance        Extremity/Trunk Assessment   Upper Extremity Assessment Upper Extremity Assessment: Generalized weakness    Lower Extremity Assessment Lower Extremity Assessment: Generalized weakness    Cervical / Trunk Assessment Cervical / Trunk Assessment: Normal  Communication   Communication: No difficulties  Cognition Arousal/Alertness: Awake/alert Behavior During Therapy: WFL for tasks assessed/performed Overall Cognitive Status: Within Functional Limits for tasks assessed                                        General Comments  Exercises     Assessment/Plan    PT Assessment Patient needs continued PT services  PT Problem List Decreased strength;Decreased mobility;Decreased activity tolerance;Pain;Decreased knowledge of use of DME       PT Treatment Interventions DME instruction;Therapeutic activities;Gait training;Therapeutic exercise;Patient/family education;Stair training;Balance training;Functional mobility training;Neuromuscular re-education    PT Goals (Current goals can be found in the Care Plan section)  Acute Rehab PT Goals Patient  Stated Goal: to return home, get stronger PT Goal Formulation: With patient Time For Goal Achievement: 02/07/20 Potential to Achieve Goals: Good    Frequency Min 3X/week   Barriers to discharge        Co-evaluation               AM-PAC PT "6 Clicks" Mobility  Outcome Measure Help needed turning from your back to your side while in a flat bed without using bedrails?: A Little Help needed moving from lying on your back to sitting on the side of a flat bed without using bedrails?: A Little Help needed moving to and from a bed to a chair (including a wheelchair)?: A Little Help needed standing up from a chair using your arms (e.g., wheelchair or bedside chair)?: A Little Help needed to walk in hospital room?: A Little Help needed climbing 3-5 steps with a railing? : A Little 6 Click Score: 18    End of Session Equipment Utilized During Treatment: Gait belt Activity Tolerance: Patient tolerated treatment well Patient left: in chair;with call bell/phone within reach;with family/visitor present Nurse Communication: Mobility status;Patient requests pain meds PT Visit Diagnosis: Muscle weakness (generalized) (M62.81);Difficulty in walking, not elsewhere classified (R26.2)    Time: 1435-1500 PT Time Calculation (min) (ACUTE ONLY): 25 min   Charges:   PT Evaluation $PT Eval Moderate Complexity: 1 Mod PT Treatments $Gait Training: 8-22 mins        Jhovany Weidinger, PT, GCS 01/31/20,3:07 PM

## 2020-02-01 ENCOUNTER — Inpatient Hospital Stay (HOSPITAL_COMMUNITY): Payer: Medicare Other

## 2020-02-01 LAB — BASIC METABOLIC PANEL
Anion gap: 9 (ref 5–15)
BUN: 19 mg/dL (ref 8–23)
CO2: 27 mmol/L (ref 22–32)
Calcium: 8.3 mg/dL — ABNORMAL LOW (ref 8.9–10.3)
Chloride: 102 mmol/L (ref 98–111)
Creatinine, Ser: 1 mg/dL (ref 0.61–1.24)
GFR calc Af Amer: >60 mL/min (ref >60)
GFR calc non Af Amer: >60 mL/min (ref >60)
Glucose, Bld: 96 mg/dL (ref 70–99)
Potassium: 3.9 mmol/L (ref 3.5–5.1)
Sodium: 138 mmol/L (ref 135–145)

## 2020-02-01 LAB — CBC WITH DIFFERENTIAL/PLATELET
Abs Immature Granulocytes: 0.08 10*3/uL — ABNORMAL HIGH (ref 0.00–0.07)
Basophils Absolute: 0 10*3/uL (ref 0.0–0.1)
Basophils Relative: 0 %
Eosinophils Absolute: 0.2 10*3/uL (ref 0.0–0.5)
Eosinophils Relative: 2 %
HCT: 31.8 % — ABNORMAL LOW (ref 39.0–52.0)
Hemoglobin: 9.9 g/dL — ABNORMAL LOW (ref 13.0–17.0)
Immature Granulocytes: 1 %
Lymphocytes Relative: 16 %
Lymphs Abs: 1.5 10*3/uL (ref 0.7–4.0)
MCH: 28 pg (ref 26.0–34.0)
MCHC: 31.1 g/dL (ref 30.0–36.0)
MCV: 89.8 fL (ref 80.0–100.0)
Monocytes Absolute: 0.8 10*3/uL (ref 0.1–1.0)
Monocytes Relative: 9 %
Neutro Abs: 6.9 10*3/uL (ref 1.7–7.7)
Neutrophils Relative %: 72 %
Platelets: 333 10*3/uL (ref 150–400)
RBC: 3.54 MIL/uL — ABNORMAL LOW (ref 4.22–5.81)
RDW: 16.9 % — ABNORMAL HIGH (ref 11.5–15.5)
WBC: 9.5 10*3/uL (ref 4.0–10.5)
nRBC: 0 % (ref 0.0–0.2)

## 2020-02-01 LAB — MAGNESIUM: Magnesium: 1.9 mg/dL (ref 1.7–2.4)

## 2020-02-01 MED ORDER — LOSARTAN POTASSIUM 25 MG PO TABS
12.5000 mg | ORAL_TABLET | Freq: Every day | ORAL | Status: DC
  Administered 2020-02-02: 12.5 mg via ORAL
  Filled 2020-02-01 (×2): qty 0.5

## 2020-02-01 MED ORDER — AMIODARONE HCL 200 MG PO TABS
100.0000 mg | ORAL_TABLET | Freq: Every day | ORAL | Status: DC
  Administered 2020-02-01 – 2020-02-02 (×2): 100 mg via ORAL
  Filled 2020-02-01 (×2): qty 1

## 2020-02-01 MED ORDER — SPIRONOLACTONE 12.5 MG HALF TABLET
12.5000 mg | ORAL_TABLET | Freq: Every day | ORAL | Status: DC
  Administered 2020-02-02: 12.5 mg via ORAL
  Filled 2020-02-01 (×2): qty 1

## 2020-02-01 MED ORDER — SULFAMETHOXAZOLE-TRIMETHOPRIM 800-160 MG PO TABS: 1.0000 | ORAL_TABLET | Freq: Two times a day (BID) | ORAL | Status: DC

## 2020-02-01 MED ORDER — SACCHAROMYCES BOULARDII 250 MG PO CAPS
250.0000 mg | ORAL_CAPSULE | Freq: Two times a day (BID) | ORAL | Status: DC
  Administered 2020-02-01 – 2020-02-02 (×3): 250 mg via ORAL
  Filled 2020-02-01 (×3): qty 1

## 2020-02-01 MED ORDER — ACETAMINOPHEN 325 MG PO TABS
650.0000 mg | ORAL_TABLET | Freq: Four times a day (QID) | ORAL | Status: DC | PRN
  Administered 2020-02-01: 650 mg via ORAL

## 2020-02-01 MED ORDER — HYDROXYCHLOROQUINE SULFATE 200 MG PO TABS
400.0000 mg | ORAL_TABLET | Freq: Every day | ORAL | Status: DC
  Administered 2020-02-01: 400 mg via ORAL
  Filled 2020-02-01 (×2): qty 2

## 2020-02-01 MED ORDER — AZATHIOPRINE 50 MG PO TABS
100.0000 mg | ORAL_TABLET | Freq: Two times a day (BID) | ORAL | Status: DC
  Administered 2020-02-01 – 2020-02-02 (×2): 100 mg via ORAL
  Filled 2020-02-01 (×4): qty 2

## 2020-02-01 MED ORDER — GADOBUTROL 1 MMOL/ML IV SOLN
7.0000 mL | Freq: Once | INTRAVENOUS | Status: AC | PRN
  Administered 2020-02-01: 7 mL via INTRAVENOUS

## 2020-02-01 MED ORDER — SULFAMETHOXAZOLE-TRIMETHOPRIM 800-160 MG PO TABS
1.0000 | ORAL_TABLET | Freq: Two times a day (BID) | ORAL | Status: DC
  Administered 2020-02-01 – 2020-02-02 (×3): 1 via ORAL
  Filled 2020-02-01 (×3): qty 1

## 2020-02-01 MED ORDER — OXYCODONE HCL 5 MG PO TABS
10.0000 mg | ORAL_TABLET | ORAL | Status: DC | PRN
  Administered 2020-02-01 – 2020-02-02 (×3): 10 mg via ORAL
  Filled 2020-02-01 (×3): qty 2

## 2020-02-01 MED ORDER — APIXABAN 5 MG PO TABS
5.0000 mg | ORAL_TABLET | Freq: Two times a day (BID) | ORAL | Status: DC
  Administered 2020-02-01 – 2020-02-02 (×2): 5 mg via ORAL
  Filled 2020-02-01 (×2): qty 1

## 2020-02-01 NOTE — Progress Notes (Signed)
PROGRESS NOTE  Cody Arellano SWH:[id number] DOB: 14-May-1946 DOA: 01/28/2020 PCP: Venia Carbon, MD  HPI/Recap of past 24 hours: HPI: Cody Arellano is a 74 y.o. male with medical history significant for combined systolic and diastolic congestive heart failure, paroxysmal atrial fibrillation on Eliquis, CAD s/p CABG, hyperlipidemia, and BPH who presents with concerns of worsening rectal pain.  CT abdomen and pelvis scan showed multifocal abscesses surrounding the rectum and there is also prominent prostate with ingrowth into the urinary bladder as well as a vague hypodensity in the body of the pancreas.  Patient does endorse that he frequently has dysuria and dribbling urinary stream.   Post I&D and penrose drain placement on 01/29/20 by general surgery, Dr. Barry Dienes.  Deep wound fluid growing e-coli.  Completed 4 days of Rocephin and IV Flagyl.  Switched to Bactrim DS twice daily on 02/01/20.    02/01/20: Seen and examined.  Reports increased perirectal pain.  Sepsis criteria has resolved.  Afebrile with no leukocytosis.      Assessment/Plan: Principal Problem:   Rectal abscess Active Problems:   CAD (coronary artery disease)   Hyperlipemia   Paroxysmal atrial fibrillation (HCC)   Hypotension   Anemia   Prostatic hypertrophy   Pancreatic lesion  Sepsis 2/2 to Rectal abscesses post I&D on 01/29/2020 by Dr. Barry Dienes. Presented with leukocytosis wbc 16.6K and RR 26 and rectal abscesses seen on CT scan Orthopedic surgery following Pain control in place and bowel regimen Leukocytosis has resolved Deep wound culture showed E. coli, sensitivities noted. Completed 4 days of empiric IV antibiotics, on Rocephin and IV Flagyl. Switched to Bactrim DS twice daily on 02/01/20.  Acute hypoxic respiratory failure possibly secondary to atelectasis versus fluid overload Initially requiring 2 to 3 L to maintain O2 saturation greater than 90% Currently he is back to his baseline oxygen requirement, on  room air 94%. Obtain home O2 evaluation prior to DC.  Paroxysmal A. fib, RVR has resolved.  Home amiodarone restarted Not on any rate control agents Continue Eliquis for primary CVA prevention  History of lupus Stable Restarted home regimen.  Resolved hypotension Resume home regimen  Iron deficiency anemia Hemoglobin of 10.1 worsening from 12.9 several months prior Iron studies suggestive of iron deficiency FOBT negative Continue p.o. ferrous sulfate 325 mg daily, laxative and stool softener  Abnormal CT scan abdomen and pelvis with contrast 01/28/20 Prostatic hypertrophy Hypodensity on head of pancreas CA 19-9, 36 on 01/29/2020 PSA pending Obtain MRI abdomen and pelvis with contrast. Curb sided with Dr. Claudia Desanctis, urology, will see outpatient.   Referral information placed in the EMR Continue tamsulosin  CAD s/p CABG Asymptomatic.   Continue aspirin and Crestor.  Chronic systolic CHF EF of 45 to 66% on echo in 10/2019. Resume cardiac medications Continue strict I's and O's and daily weight Net I&O -302cc  Hyperlipidemia Continue statin  DVT prophylaxis: Subcu Lovenox daily Code Status: Full Family Communication: Updated his wife via phone on 02/01/2020.  Consults called: General surgery, curb sided with urology Dr. Claudia Desanctis on 02/01/2020. Admission status: inpatient Status is: Inpatient  Remains inpatient appropriate because:Inpatient level of care appropriate due to severity of illness   Dispo: The patient is from: Home  Anticipated d/c is to: Home  Anticipated d/c date is: 02/02/2020  Patient currently is not medically stable to d/c due to ongoing treatment for sepsis and evaluation of abnormal CT scan..     Objective: Vitals:   01/31/20 1500 01/31/20 1900 02/01/20 0300 02/01/20 5993  BP:  94/70 97/65 108/73  Pulse:  93 100 99  Resp:  16 15 18   Temp:  98.5 F (36.9 C) 98.2 F (36.8 C) 97.7 F (36.5 C)  TempSrc:   Oral Oral Oral  SpO2: 97% 98% 93% 94%  Weight:      Height:        Intake/Output Summary (Last 24 hours) at 02/01/2020 1215 Last data filed at 02/01/2020 1142 Gross per 24 hour  Intake 480 ml  Output 2600 ml  Net -2120 ml   Filed Weights   01/29/20 1145  Weight: 69.9 kg    Exam:  . General: 74 y.o. year-old male pleasant in no acute distress.  Alert and oriented x3.   . Cardiovascular: Irregular rate and rhythm no rubs or gallops. [name] Respiratory: Clear to auscultation no wheezes or rales.   . Abdomen: Soft nontender normal bowel sounds present. . Musculoskeletal: No lower extremity edema bilaterally.   [name] Psychiatry: Mood is appropriate for condition and setting.  Data Reviewed: CBC: Recent Labs  Lab 01/28/20 0749 01/29/20 0505 01/30/20 0321 01/31/20 0155 02/01/20 0450  WBC 16.6* 20.2* 18.1* 18.8* 9.5  NEUTROABS 14.9*  --  17.0* 16.3* 6.9  HGB 10.1* 9.5* 9.6* 9.8* 9.9*  HCT 33.9* 30.8* 31.2* 31.1* 31.8*  MCV 93.6 92.8 90.2 90.1 89.8  PLT 335 316 301 309 676   Basic Metabolic Panel: Recent Labs  Lab 01/28/20 0749 01/29/20 0505 01/30/20 0321 02/01/20 0450  NA 135 135 134* 138  K 4.5 4.4 5.1 3.9  CL 103 104 102 102  CO2 22 19* 22 27  GLUCOSE 129* 76 137* 96  BUN 10 9 17 19   CREATININE 0.99 0.84 0.93 1.00  CALCIUM 8.5* 8.4* 8.6* 8.3*  MG  --   --   --  1.9   GFR: Estimated Creatinine Clearance: 64.1 mL/min (by C-G formula based on SCr of 1 mg/dL). Liver Function Tests: Recent Labs  Lab 01/28/20 0749  AST 17  ALT 20  ALKPHOS 44  BILITOT 0.3  PROT 5.9*  ALBUMIN 2.2*   No results for input(s): LIPASE, AMYLASE in the last 168 hours. No results for input(s): AMMONIA in the last 168 hours. Coagulation Profile: No results for input(s): INR, PROTIME in the last 168 hours. Cardiac Enzymes: No results for input(s): CKTOTAL, CKMB, CKMBINDEX, TROPONINI in the last 168 hours. BNP (last 3 results) No results for input(s): PROBNP in the last 8760  hours. HbA1C: No results for input(s): HGBA1C in the last 72 hours. CBG: No results for input(s): GLUCAP in the last 168 hours. Lipid Profile: No results for input(s): CHOL, HDL, LDLCALC, TRIG, CHOLHDL, LDLDIRECT in the last 72 hours. Thyroid Function Tests: No results for input(s): TSH, T4TOTAL, FREET4, T3FREE, THYROIDAB in the last 72 hours. Anemia Panel: No results for input(s): VITAMINB12, FOLATE, FERRITIN, TIBC, IRON, RETICCTPCT in the last 72 hours. Urine analysis:    Component Value Date/Time   COLORURINE YELLOW 01/29/2020 0524   APPEARANCEUR CLEAR 01/29/2020 0524   LABSPEC >1.046 (H) 01/29/2020 0524   PHURINE 5.0 01/29/2020 0524   GLUCOSEU NEGATIVE 01/29/2020 0524   HGBUR SMALL (A) 01/29/2020 0524   BILIRUBINUR NEGATIVE 01/29/2020 0524   KETONESUR 5 (A) 01/29/2020 0524   PROTEINUR NEGATIVE 01/29/2020 0524   UROBILINOGEN 2.0 (H) 02/04/2013 0200   NITRITE POSITIVE (A) 01/29/2020 0524   LEUKOCYTESUR SMALL (A) 01/29/2020 0524   Sepsis Labs: @LABRCNTIP (procalcitonin:4,lacticidven:4)  ) Recent Results (from the past 240 hour(s))  SARS Coronavirus 2 by RT  PCR (hospital order, performed in [facility] hospital lab) Nasopharyngeal Nasopharyngeal Swab     Status: None   Collection Time: 01/28/20 11:00 PM   Specimen: Nasopharyngeal Swab  Result Value Ref Range Status   SARS Coronavirus 2 NEGATIVE NEGATIVE Final    Comment: (NOTE) SARS-CoV-2 target nucleic acids are NOT DETECTED.  The SARS-CoV-2 RNA is generally detectable in upper and lower respiratory specimens during the acute phase of infection. The lowest concentration of SARS-CoV-2 viral copies this assay can detect is 250 copies / mL. A negative result does not preclude SARS-CoV-2 infection and should not be used as the sole basis for treatment or other patient management decisions.  A negative result may occur with improper specimen collection / handling, submission of specimen other than nasopharyngeal swab,  presence of viral mutation(s) within the areas targeted by this assay, and inadequate number of viral copies (<250 copies / mL). A negative result must be combined with clinical observations, patient history, and epidemiological information.  Fact Sheet for Patients:   StrictlyIdeas.no  Fact Sheet for Healthcare Providers: BankingDealers.co.za  This test is not yet approved or  cleared by the [place] FDA and has been authorized for detection and/or diagnosis of SARS-CoV-2 by FDA under an Emergency Use Authorization (EUA).  This EUA will remain in effect (meaning this test can be used) for the duration of the COVID-19 declaration under Section 564(b)(1) of the Act, 21 U.S.C. section 360bbb-3(b)(1), unless the authorization is terminated or revoked sooner.  Performed at [facility], [place] [address] [place], [place] [address]   Surgical PCR screen     Status: None   Collection Time: 01/29/20  8:40 AM   Specimen: Nasal Mucosa; Nasal Swab  Result Value Ref Range Status   MRSA, PCR NEGATIVE NEGATIVE Final   Staphylococcus aureus NEGATIVE NEGATIVE Final    Comment: (NOTE) The Xpert SA Assay (FDA approved for NASAL specimens in patients 31 years of age and older), is one component of a comprehensive surveillance program. It is not intended to diagnose infection nor to guide or monitor treatment. Performed at [facility], [place] [address] [place], [place] [address]   Aerobic/Anaerobic Culture (surgical/deep wound)     Status: None (Preliminary result)   Collection Time: 01/29/20  1:08 PM   Specimen: Abscess  Result Value Ref Range Status   Specimen Description ABSCESS  Final   Special Requests NONE  Final   Gram Stain   Final    MODERATE WBC PRESENT, PREDOMINANTLY PMN FEW GRAM NEGATIVE RODS RARE GRAM POSITIVE COCCI Performed at [facility], [place] [address], [place], [place] [address]    Culture   Final     MODERATE ESCHERICHIA COLI NO ANAEROBES ISOLATED; CULTURE IN PROGRESS FOR 5 DAYS    Report Status PENDING  Incomplete   Organism ID, Bacteria ESCHERICHIA COLI  Final      Susceptibility   Escherichia coli - MIC*    AMPICILLIN >=32 RESISTANT Resistant     CEFAZOLIN >=64 RESISTANT Resistant     CEFEPIME <=0.12 SENSITIVE Sensitive     CEFTAZIDIME <=1 SENSITIVE Sensitive     CEFTRIAXONE 0.5 SENSITIVE Sensitive     CIPROFLOXACIN <=0.25 SENSITIVE Sensitive     GENTAMICIN <=1 SENSITIVE Sensitive     IMIPENEM <=0.25 SENSITIVE Sensitive     TRIMETH/SULFA <=20 SENSITIVE Sensitive     AMPICILLIN/SULBACTAM >=32 RESISTANT Resistant     PIP/TAZO <=4 SENSITIVE Sensitive     * MODERATE ESCHERICHIA  COLI      Studies: No results found.  Scheduled Meds: . amiodarone  100 mg Oral Daily  . apixaban  5 mg Oral BID  . aspirin EC  81 mg Oral Daily  . azaTHIOprine  100 mg Oral BID  . Chlorhexidine Gluconate Cloth  6 each Topical Daily  . feeding supplement  237 mL Oral BID BM  . ferrous sulfate  325 mg Oral Q breakfast  . furosemide  20 mg Intravenous TID  . hydroxychloroquine  400 mg Oral QHS  . mupirocin ointment  1 application Nasal BID  . polyethylene glycol  17 g Oral BID  . polyvinyl alcohol  1 drop Both Eyes Daily  . rosuvastatin  40 mg Oral Daily  . saccharomyces boulardii  250 mg Oral BID  . senna-docusate  2 tablet Oral BID  . sulfamethoxazole-trimethoprim  1 tablet Oral Q12H  . tamsulosin  0.4 mg Oral QPM    Continuous Infusions:    LOS: 4 days     Kayleen Memos, MD [facility] Pager [phone]  If 7PM-7AM, please contact night-coverage [email or web address] Password [email or web address] 02/01/2020, 12:15 PM

## 2020-02-01 NOTE — Progress Notes (Signed)
[facility] Progress Note  3 Days Post-Op  Subjective: CC-  Overall feeling well. Drainage from perirectal surgical site is less. Mild nausea after taking oxycodone this morning but no further emesis. BM yesterday. Tolerating diet. Ambulated in the hall yesterday without issues. WBC down 9.5, afebrile  Objective: Vital signs in last 24 hours: Temp:  [97.7 F (36.5 C)-98.5 F (36.9 C)] 97.7 F (36.5 C) (08/30 0829) Pulse Rate:  [85-100] 99 (08/30 0829) Resp:  [15-18] 18 (08/30 0829) BP: (94-113)/(65-86) 108/73 (08/30 0829) SpO2:  [93 %-98 %] 94 % (08/30 0829) Last BM Date: 01/31/20  Intake/Output from previous day: 08/29 0701 - 08/30 0700 In: 600 [P.O.:600] Out: 2600 [Urine:2600] Intake/Output this shift: Total I/O In: -  Out: 200 [Urine:200]  PE: Gen:  Alert, NAD, pleasant Pulm: rate and effort normal GU: bilateral perirectal incisions with penrose drain intact/ small amount of purulent drainage on dressing/ no cellulitis or induration  Lab Results:  Recent Labs    01/31/20 0155 02/01/20 0450  WBC 18.8* 9.5  HGB 9.8* 9.9*  HCT 31.1* 31.8*  PLT 309 333   BMET Recent Labs    01/30/20 0321 02/01/20 0450  NA 134* 138  K 5.1 3.9  CL 102 102  CO2 22 27  GLUCOSE 137* 96  BUN 17 19  CREATININE 0.93 1.00  CALCIUM 8.6* 8.3*   PT/INR No results for input(s): LABPROT, INR in the last 72 hours. CMP     Component Value Date/Time   NA 138 02/01/2020 0450   NA 142 06/12/2018 1424   K 3.9 02/01/2020 0450   CL 102 02/01/2020 0450   CO2 27 02/01/2020 0450   GLUCOSE 96 02/01/2020 0450   BUN 19 02/01/2020 0450   BUN 8 06/12/2018 1424   CREATININE 1.00 02/01/2020 0450   CALCIUM 8.3 (L) 02/01/2020 0450   PROT 5.9 (L) 01/28/2020 0749   PROT 6.4 06/12/2018 1424   ALBUMIN 2.2 (L) 01/28/2020 0749   ALBUMIN 3.4 (L) 06/12/2018 1424   AST 17 01/28/2020 0749   ALT 20 01/28/2020 0749   ALKPHOS 44 01/28/2020 0749   BILITOT 0.3 01/28/2020 0749   BILITOT  0.3 06/12/2018 1424   GFRNONAA >60 02/01/2020 0450   GFRAA >60 02/01/2020 0450   Lipase  No results found for: LIPASE     Studies/Results: No results found.  Anti-infectives: Anti-infectives (From admission, onward)   Start     Dose/Rate Route Frequency Ordered Stop   02/01/20 2200  hydroxychloroquine (PLAQUENIL) tablet 400 mg        400 mg Oral Daily at bedtime 02/01/20 0619     02/01/20 1000  sulfamethoxazole-trimethoprim (BACTRIM DS) 800-160 MG per tablet 1 tablet  Status:  Discontinued        1 tablet Oral Every 12 hours 02/01/20 0616 02/01/20 0618   02/01/20 1000  sulfamethoxazole-trimethoprim (BACTRIM DS) 800-160 MG per tablet 1 tablet        1 tablet Oral Every 12 hours 02/01/20 0618 02/11/20 0959   01/29/20 1800  cefTRIAXone (ROCEPHIN) 2 g in sodium chloride 0.9 % 100 mL IVPB  Status:  Discontinued        2 g 200 mL/hr over 30 Minutes Intravenous Every 24 hours 01/29/20 0643 02/01/20 0618   01/29/20 0800  metroNIDAZOLE (FLAGYL) IVPB 500 mg  Status:  Discontinued        500 mg 100 mL/hr over 60 Minutes Intravenous Every 8 hours 01/28/20 2349 02/01/20 0618   01/28/20 2230  metroNIDAZOLE (  FLAGYL) IVPB 500 mg        500 mg 100 mL/hr over 60 Minutes Intravenous  Once 01/28/20 2223 01/29/20 0131   01/28/20 2230  cefTRIAXone (ROCEPHIN) 1 g in sodium chloride 0.9 % 100 mL IVPB  Status:  Discontinued        1 g 200 mL/hr over 30 Minutes Intravenous Every 24 hours 01/28/20 2223 01/29/20 4503       Assessment/Plan Complex anterior horseshoe perirectal abscess - s/p I&D with penrose drain placement, Dr. Barry Dienes, 01/29/2020 - POD #3 - Maintain penrose drain - Cont abx. Cx's  E coli - Mobilize - continue sitz baths and pain control as needed.    FEN - Reg VTE - SCDs, Lovenox  ID - Rocephin/Flagyl  8/26>>8/30, bactrim 8/30>>day#1 Foley - None  Follow-Up - [facility] clinic  Plan - Patient is stable for discharge from surgical standpoint. Continue penrose drain, sitz baths,  shower daily. Antibiotics switched to bactrim this morning, recommend 5 total days of antibiotics. Discharge instructions and follow up info on AVS. General surgery will sign off, please call with concerns.   LOS: 4 days    [facility] 02/01/2020, 9:56 AM Please see Amion for pager number during day hours 7:00am-4:30pm

## 2020-02-01 NOTE — Progress Notes (Signed)
The patient was ambulated to the bathroom and showered in the the bathroom to the rectal area.  ABD pads and 4x4 dressing placed in underwear.  Small amount of brownish color drainage noted from pen rose sites

## 2020-02-01 NOTE — Plan of Care (Signed)
  Problem: Education: Goal: Knowledge of General Education information will improve Description: Including pain rating scale, medication(s)/side effects and non-pharmacologic comfort measures Outcome: Progressing   Problem: Health Behavior/Discharge Planning: Goal: Ability to manage health-related needs will improve Outcome: Progressing   Problem: Activity: Goal: Risk for activity intolerance will decrease Outcome: Progressing   Problem: Coping: Goal: Level of anxiety will decrease Outcome: Progressing   Problem: Safety: Goal: Ability to remain free from injury will improve Outcome: Progressing   Problem: Skin Integrity: Goal: Risk for impaired skin integrity will decrease Outcome: Progressing

## 2020-02-01 NOTE — Progress Notes (Signed)
Physical Therapy Treatment Patient Details Name: Cody Arellano MRN: [id number] DOB: [birth year] Today's Date: 02/01/2020    History of Present Illness [name] is a 74 y.o. 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    PT Comments    Patient received in bed, son present. Reports he is to have MRI later, agrees to PT session. He is mod independent with bed mobility, min guard with transfers from slightly elevated surface. Ambulated 200 feet with RW and min guard. Slow, steady cadence. Mildly unsteady initially, improved with increased distance. He will continue to benefit from skilled PT while here to improve strength and functional independence.      Follow Up Recommendations  Home health PT     Equipment Recommendations  Rolling walker with 5" wheels    Recommendations for Other Services       Precautions / Restrictions Precautions Precautions: Fall Precaution Comments: low fall Restrictions Weight Bearing Restrictions: No    Mobility  Bed Mobility Overal bed mobility: Modified Independent             General bed mobility comments: increased time/effort  Transfers Overall transfer level: Modified independent Equipment used: Rolling walker (2 wheeled) Transfers: Sit to/from Stand Sit to Stand: Supervision;From elevated surface            Ambulation/Gait Ambulation/Gait assistance: Min guard;Supervision Gait Distance (Feet): 200 Feet Assistive device: Rolling walker (2 wheeled) Gait Pattern/deviations: Step-through pattern;Decreased stride length;Narrow base of support Gait velocity: decreased   General Gait Details: slightly unsteady initially. Improved with gait distance   Stairs             Wheelchair Mobility    Modified Rankin (Stroke Patients  Only)       Balance Overall balance assessment: Modified Independent                                          Cognition Arousal/Alertness: Awake/alert Behavior During Therapy: WFL for tasks assessed/performed Overall Cognitive Status: Within Functional Limits for tasks assessed                                        Exercises      General Comments        Pertinent Vitals/Pain Pain Assessment: Faces Faces Pain Scale: Hurts a little bit Pain Location: bottom Pain Descriptors / Indicators: Sore;Discomfort    Home Living                      Prior Function            PT Goals (current goals can now be found in the care plan section) Acute Rehab PT Goals Patient Stated Goal: to return home, get stronger PT Goal Formulation: With patient Time For Goal Achievement: 02/07/20 Potential to Achieve Goals: Good Progress towards PT goals: Progressing toward goals    Frequency    Min 3X/week      PT Plan Current plan remains appropriate    Co-evaluation              AM-PAC PT "6 Clicks" Mobility   Outcome Measure  Help needed turning  from your back to your side while in a flat bed without using bedrails?: None Help needed moving from lying on your back to sitting on the side of a flat bed without using bedrails?: A Little Help needed moving to and from a bed to a chair (including a wheelchair)?: A Little Help needed standing up from a chair using your arms (e.g., wheelchair or bedside chair)?: A Little Help needed to walk in hospital room?: A Little Help needed climbing 3-5 steps with a railing? : A Little 6 Click Score: 19    End of Session Equipment Utilized During Treatment: Gait belt Activity Tolerance: Patient tolerated treatment well Patient left: in bed;with call bell/phone within reach;with family/visitor present Nurse Communication: Mobility status;Patient requests pain meds PT Visit Diagnosis: Muscle  weakness (generalized) (M62.81);Difficulty in walking, not elsewhere classified (R26.2)     Time: 0165-5374 PT Time Calculation (min) (ACUTE ONLY): 15 min  Charges:  $Gait Training: 8-22 mins                     Tacora Athanas, PT, GCS 02/01/20,2:55 PM

## 2020-02-01 NOTE — Plan of Care (Signed)

## 2020-02-02 ENCOUNTER — Telehealth: Payer: Self-pay

## 2020-02-02 DIAGNOSIS — I951 Orthostatic hypotension: Secondary | ICD-10-CM

## 2020-02-02 DIAGNOSIS — E7800 Pure hypercholesterolemia, unspecified: Secondary | ICD-10-CM

## 2020-02-02 DIAGNOSIS — D500 Iron deficiency anemia secondary to blood loss (chronic): Secondary | ICD-10-CM

## 2020-02-02 LAB — BASIC METABOLIC PANEL WITH GFR
Anion gap: 7 (ref 5–15)
BUN: 14 mg/dL (ref 8–23)
CO2: 28 mmol/L (ref 22–32)
Calcium: 8 mg/dL — ABNORMAL LOW (ref 8.9–10.3)
Chloride: 103 mmol/L (ref 98–111)
Creatinine, Ser: 0.93 mg/dL (ref 0.61–1.24)
GFR calc Af Amer: >60 mL/min
GFR calc non Af Amer: >60 mL/min
Glucose, Bld: 87 mg/dL (ref 70–99)
Potassium: 3.9 mmol/L (ref 3.5–5.1)
Sodium: 138 mmol/L (ref 135–145)

## 2020-02-02 LAB — CBC WITH DIFFERENTIAL/PLATELET
Abs Immature Granulocytes: 0.05 K/uL (ref 0.00–0.07)
Basophils Absolute: 0 K/uL (ref 0.0–0.1)
Basophils Relative: 1 %
Eosinophils Absolute: 0.3 K/uL (ref 0.0–0.5)
Eosinophils Relative: 4 %
HCT: 33 % — ABNORMAL LOW (ref 39.0–52.0)
Hemoglobin: 10.2 g/dL — ABNORMAL LOW (ref 13.0–17.0)
Immature Granulocytes: 1 %
Lymphocytes Relative: 22 %
Lymphs Abs: 1.6 K/uL (ref 0.7–4.0)
MCH: 28.1 pg (ref 26.0–34.0)
MCHC: 30.9 g/dL (ref 30.0–36.0)
MCV: 90.9 fL (ref 80.0–100.0)
Monocytes Absolute: 0.6 K/uL (ref 0.1–1.0)
Monocytes Relative: 9 %
Neutro Abs: 4.8 K/uL (ref 1.7–7.7)
Neutrophils Relative %: 63 %
Platelets: 372 K/uL (ref 150–400)
RBC: 3.63 MIL/uL — ABNORMAL LOW (ref 4.22–5.81)
RDW: 17 % — ABNORMAL HIGH (ref 11.5–15.5)
WBC: 7.4 K/uL (ref 4.0–10.5)
nRBC: 0.3 % — ABNORMAL HIGH (ref 0.0–0.2)

## 2020-02-02 LAB — PSA, SERUM (SERIAL MONITOR): Prostate Specific Ag, Serum: 2.2 ng/mL (ref 0.0–4.0)

## 2020-02-02 MED ORDER — FERROUS SULFATE 325 (65 FE) MG PO TABS
325.0000 mg | ORAL_TABLET | Freq: Every day | ORAL | 3 refills | Status: DC
Start: 2020-02-02 — End: 2020-03-21

## 2020-02-02 MED ORDER — AMIODARONE HCL 100 MG PO TABS
100.0000 mg | ORAL_TABLET | Freq: Two times a day (BID) | ORAL | 1 refills | Status: DC
Start: 2020-02-02 — End: 2020-03-21

## 2020-02-02 MED ORDER — SULFAMETHOXAZOLE-TRIMETHOPRIM 800-160 MG PO TABS: 1.0000 | ORAL_TABLET | Freq: Two times a day (BID) | ORAL | 0 refills | Status: AC

## 2020-02-02 MED ORDER — OXYCODONE HCL 5 MG PO TABS
5.0000 mg | ORAL_TABLET | Freq: Four times a day (QID) | ORAL | 0 refills | Status: AC | PRN
Start: 2020-02-02 — End: ?

## 2020-02-02 MED ORDER — APIXABAN 5 MG PO TABS: 5.0000 mg | ORAL_TABLET | Freq: Two times a day (BID) | ORAL | 2 refills | Status: DC

## 2020-02-02 NOTE — TOC Initial Note (Signed)
Transition of Care [facility]) - Initial/Assessment Note    Patient Details  Name: Cody Arellano MRN: [id number] Date of Birth: 1946/04/04  Transition of [facility]) CM/SW Contact:    Curlene Labrum, RN Phone Number: 02/02/2020, 10:48 AM  Clinical Narrative:                 Case Management met with the patient at the bedside concerning transitions to home regarding S/P I&D of complex anterior horseshoe perirectal abscess with penrose drains intact and covered with gauze and tape.  Patient to be supplied with dressings supplies for home.  I called and followed up with the [place] - will [facility].  Patient given choice regarding home health choice and patient did not have a preference.  Alvis Lemmings called and spoke with Tommi Rumps - confirmed acceptance for home health services including RN, PT, OT for home.  Patient to be provided with a Rolling walker prior to his discharge home with the floor stock from [facility] from nursing.  The patient is expecting his wife to drive him home.  Expected Discharge Plan: [place] Barriers to Discharge: No Barriers Identified   Patient Goals and CMS Choice Patient states their goals for this hospitalization and ongoing recovery are:: Patient plans to discharge to home.   Choice offered to / list presented to : Patient  Expected Discharge Plan and Services Expected Discharge Plan: [place]   Discharge Planning Services: CM Consult Post Acute Care Choice: Durable Medical Equipment, Home Health Living arrangements for the past 2 months: Single Family Home Expected Discharge Date: 02/02/20               DME Arranged: Gilford Rile rolling DME Agency: [employer] Date DME Agency Contacted: 02/02/20 Time DME Agency Contacted: 34 Representative spoke with at DME Agency: dme obtained from [place] Arranged: RN, PT, OT HH Agency: [place] Date [place]: 02/02/20 Time [place]:  51 Representative spoke with at [place], [place] at [place] Arrangements/Services Living arrangements for the past 2 months: [place] with:: Spouse Patient language and need for interpreter reviewed:: Yes Do you feel safe going back to the place where you live?: Yes      Need for Family Participation in Patient Care: Yes (Comment) Care giver support system in place?: Yes (comment)   Criminal Activity/Legal Involvement Pertinent to Current Situation/Hospitalization: No - Comment as needed  Activities of Daily Living Home Assistive Devices/Equipment: None ADL Screening (condition at time of admission) Patient's cognitive ability adequate to safely complete daily activities?: Yes Is the patient deaf or have difficulty hearing?: (P) No Does the patient have difficulty seeing, even when wearing glasses/contacts?: (P) No Does the patient have difficulty concentrating, remembering, or making decisions?: (P) No Patient able to express need for assistance with ADLs?: Yes Does the patient have difficulty dressing or bathing?: No Independently performs ADLs?: Yes (appropriate for developmental age) Does the patient have difficulty walking or climbing stairs?: No Weakness of Legs: None Weakness of Arms/Hands: None  Permission Sought/Granted Permission sought to share information with : Case Manager Permission granted to share information with : Yes, Verbal Permission Granted     Permission granted to share info w AGENCY: [place] granted to share info w Relationship: patient's wife     Emotional Assessment Appearance:: Appears stated age Attitude/Demeanor/Rapport: Gracious Affect (typically observed): Accepting Orientation: : Oriented  to Self, Oriented to Place, Oriented to  Time, Oriented to Situation Alcohol / Substance Use: Not Applicable Psych Involvement: No (comment)  Admission diagnosis:  Rectal abscess [K61.1] Patient  Active Problem List   Diagnosis Date Noted  . Hypotension 01/29/2020  . Anemia 01/29/2020  . Prostatic hypertrophy 01/29/2020  . Pancreatic lesion 01/29/2020  . Rectal abscess 01/28/2020  . STEMI (ST elevation myocardial infarction) ([place]) 10/17/2019  . STEMI involving left anterior descending coronary artery ([place]) 10/17/2019  . 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 Paroxysmal atrial fibrillation ([place]) 01/20/2019  . Chronic tension headache   . Preventative health care 01/09/2018  . Memory loss 01/09/2018  . Advance directive discussed with patient 01/09/2018  . Hyperlipemia 01/17/2016  . 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 Personal history of colonic polyps 05/20/2013  . Episodic mood disorder ([place]) 05/13/2013  . Systemic lupus erythematosus ([place]) 05/13/2013  . Colon polyps   . CAD (coronary artery disease) 04/09/2013  . 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 ILD (interstitial lung disease) ([place]) 02/04/2013   PCP:  Venia Carbon, MD Pharmacy:   [id number] [place], [address] [place] [address] Phone: [phone] Fax: [place], [place] - [id number] [place] [address] Phone: [phone] Fax: [phone]     Social Determinants of Health (SDOH) Interventions    Readmission Risk Interventions No flowsheet data found.

## 2020-02-02 NOTE — Plan of Care (Signed)
  Problem: Education: Goal: Knowledge of General Education information will improve Description: Including pain rating scale, medication(s)/side effects and non-pharmacologic comfort measures Outcome: Progressing   Problem: Health Behavior/Discharge Planning: Goal: Ability to manage health-related needs will improve Outcome: Progressing   Problem: Activity: Goal: Risk for activity intolerance will decrease Outcome: Progressing   Problem: Coping: Goal: Level of anxiety will decrease Outcome: Progressing   Problem: Elimination: Goal: Will not experience complications related to bowel motility Outcome: Progressing   Problem: Pain Managment: Goal: General experience of comfort will improve Outcome: Progressing   Problem: Safety: Goal: Ability to remain free from injury will improve Outcome: Progressing   Problem: Skin Integrity: Goal: Risk for impaired skin integrity will decrease Outcome: Progressing   

## 2020-02-02 NOTE — Telephone Encounter (Signed)
Transition Care Management Follow-up Telephone Call  Date of discharge and from where: [name]  How have you been since you were released from the hospital? Patient states that he is doing better. Some fatigue still noted.    Any questions or concerns? No  Items Reviewed:  Did the pt receive and understand the discharge instructions provided? Yes   Medications obtained and verified? Yes   Any new allergies since your discharge? No   Dietary orders reviewed? Yes  Do you have support at home? Yes   Functional Questionnaire: (I = Independent and D = Dependent) ADLs: I  Bathing/Dressing- I  Meal Prep- I  Eating- I  Maintaining continence- I  Transferring/Ambulation- I, uses walker  Managing Meds- I  Follow up appointments reviewed:   PCP Hospital f/u appt confirmed? Dr. Silvio Pate does not have any 30 minute slots available within the next 2 weeks on his schedule. Forwarded this to the provider and front office supervisor and notified patient someone will call him to get him scheduled in the next couple of days.    [facility] f/u appt confirmed? follow up with cardiology   Are transportation arrangements needed? No   If their condition worsens, is the pt aware to call PCP or go to the Emergency Dept.? Yes  Was the patient provided with contact information for the PCP's office or ED? Yes  Was to pt encouraged to call back with questions or concerns? Yes

## 2020-02-02 NOTE — Plan of Care (Signed)

## 2020-02-02 NOTE — Progress Notes (Signed)
Discharge packet provided to the patient.  Reviewed instructions with his wife.

## 2020-02-02 NOTE — Discharge Instructions (Signed)
Anorectal Abscess An abscess is an infected area that contains a collection of pus. An anorectal abscess is an abscess that is near the opening of the anus or around the rectum. Without treatment, an anorectal abscess can become larger and cause other problems, such as a more serious body-wide infection or pain, especially during bowel movements. What are the causes? This condition is caused by plugged glands or an infection in one of these areas:  The anus.  The area between the anus and the scrotum in males or between the anus and the vagina in females (perineum). What increases the risk? The following factors may make you more likely to develop this condition:  Diabetes or inflammatory bowel disease.  Having a body defense system (immune system) that is weak.  Engaging in anal sex.  Having a sexually transmitted infection (STI).  Certain kinds of cancer, such as rectal carcinoma, leukemia, or lymphoma. What are the signs or symptoms? The main symptom of this condition is pain. The pain may be a throbbing pain that gets worse during bowel movements. Other symptoms include:  Swelling and redness in the area of the abscess. The redness may go beyond the abscess and appear as a red streak on the skin.  A visible, painful lump, or a lump that can be felt when touched.  Bleeding or pus-like discharge from the area.  Fever.  General weakness.  Constipation.  Diarrhea. How is this diagnosed? This condition is diagnosed based on your medical history and a physical exam of the affected area.  This may involve examining the rectal area with a gloved hand (digital rectal exam).  Sometimes, the health care provider needs to look into the rectum using a probe, scope, or imaging test.  For women, it may require a careful vaginal exam. How is this treated? Treatment for this condition may include:  Incision and drainage surgery. This involves making an incision over the abscess to  drain the pus.  Medicines, including antibiotic medicine, pain medicine, stool softeners, or laxatives. Follow these instructions at home: Medicines  Take over-the-counter and prescription medicines only as told by your health care provider.  If you were prescribed an antibiotic medicine, use it as told by your health care provider. Do not stop using the antibiotic even if you start to feel better.  Do not drive or use heavy machinery while taking prescription pain medicine. Wound care   If gauze was used in the abscess, follow instructions from your health care provider about removing or changing the gauze. It can usually be removed in 2-3 days.  Wash your hands with soap and water before you remove or change your gauze. If soap and water are not available, use hand sanitizer.  If one or more drains were placed in the abscess cavity, be careful not to pull at them. Your health care provider will tell you how long they need to remain in place.  Check your incision area every day for signs of infection. Check for: ? More redness, swelling, or pain. ? More fluid or blood. ? Warmth. ? Pus or a bad smell. Managing pain, stiffness, and swelling   Take a sitz bath 3-4 times a day and after bowel movements. This will help reduce pain and swelling.  To relieve pain, try sitting: ? On a heating pad with the setting on low. ? On an inflatable donut-shaped cushion.  If directed, put ice on the affected area: ? Put ice in a plastic bag. ? Place   a towel between your skin and the bag. ? Leave the ice on for 20 minutes, 2-3 times a day. General instructions  Follow any diet instructions given by your health care provider.  Keep all follow-up visits as told by your health care provider. This is important. Contact a health care provider if you have:  Bleeding from your incision.  Pain, swelling, or redness that does not improve or gets worse.  Trouble passing stool or  urine.  Symptoms that return after treatment. Get help right away if you:  Have problems moving or using your legs.  Have severe or increasing pain.  Have swelling in the affected area that suddenly gets worse.  Have a large increase in bleeding or passing of pus.  Develop chills or a fever. Summary  An anorectal abscess is an abscess that is near the opening of the anus or around the rectum. An abscess is an infected area that contains a collection of pus.  The main symptom of this condition is pain. It may be a throbbing pain that gets worse during bowel movements.  Treatment for an anorectal abscess may include surgery to drain the pus from the abscess. Medicines and sitz baths may also be a part of your treatment plan. This information is not intended to replace advice given to you by your health care provider. Make sure you discuss any questions you have with your health care provider. Document Revised: 06/27/2017 Document Reviewed: 06/27/2017 Elsevier Patient Education  2020 [employer].   How to Take a [employer] A sitz bath is a warm water bath that may be used to care for your rectum, genital area, or the area between your rectum and genitals (perineum). For a sitz bath, the water only comes up to your hips and covers your buttocks. A sitz bath may done at home in a bathtub or with a portable sitz bath that fits over the toilet. Your health care provider may recommend a sitz bath to help:  Relieve pain and discomfort after delivering a baby.  Relieve pain and itching from hemorrhoids or anal fissures.  Relieve pain after certain surgeries.  Relax muscles that are sore or tight. How to take a sitz bath Take 3-4 sitz baths a day, or as many as told by your health care provider. Bathtub sitz bath To take a sitz bath in a bathtub: 1. Partially fill a bathtub with warm water. The water should be deep enough to cover your hips and buttocks when you are sitting in the  tub. 2. If your health care provider told you to put medicine in the water, follow his or her instructions. 3. Sit in the water. 4. Open the tub drain a little, and leave it open during your bath. 5. Turn on the warm water again, enough to replace the water that is draining out. Keep the water running throughout your bath. This helps keep the water at the right level and the right temperature. 6. Soak in the water for 15-20 minutes, or as long as told by your health care provider. 7. When you are done, be careful when you stand up. You may feel dizzy. 8. After the sitz bath, pat yourself dry. Do not rub your skin to dry it.  Over-the-toilet sitz bath To take a sitz bath with an over-the-toilet basin: 1. Follow the manufacturer's instructions. 2. Fill the basin with warm water. 3. If your health care provider told you to put medicine in the water, follow his  or her instructions. 4. Sit on the seat. Make sure the water covers your buttocks and perineum. 5. Soak in the water for 15-20 minutes, or as long as told by your health care provider. 6. After the sitz bath, pat yourself dry. Do not rub your skin to dry it. 7. Clean and dry the basin between uses. 8. Discard the basin if it cracks, or according to the manufacturer's instructions. Contact a health care provider if:  Your symptoms get worse. Do not continue with sitz baths if your symptoms get worse.  You have new symptoms. If this happens, do not continue with sitz baths until you talk with your health care provider. Summary  A sitz bath is a warm water bath in which the water only comes up to your hips and covers your buttocks.  A sitz bath may help relieve itching, relieve pain, and relax muscles that are sore or tight in the lower part of your body, including your genital area.  Take 3-4 sitz baths a day, or as many as told by your health care provider. Soak in the water for 15-20 minutes.  Do not continue with sitz baths if  your symptoms get worse. This information is not intended to replace advice given to you by your health care provider. Make sure you discuss any questions you have with your health care provider. Document Revised: 10/20/2018 Document Reviewed: 05/23/2017 Elsevier Patient Education  [place] on my medicine - ELIQUIS (apixaban)  This medication education was reviewed with me or my healthcare representative as part of my discharge preparation.   [name] Why was Eliquis prescribed for you? Eliquis was prescribed for you to reduce the risk of a blood clot forming that can cause a stroke if you have a medical condition called atrial fibrillation (a type of irregular heartbeat).  What do You need to know about Eliquis ? Take your Eliquis  5mg  by mouth TWICE DAILY - one tablet in the morning and one tablet in the evening with or without food. If you have difficulty swallowing the tablet whole please discuss with your pharmacist how to take the medication safely.  Take Eliquis exactly as prescribed by your doctor and DO NOT stop taking Eliquis without talking to the doctor who prescribed the medication.  Stopping may increase your risk of developing a stroke.  Refill your prescription before you run out.  After discharge, you should have regular check-up appointments with your healthcare provider that is prescribing your Eliquis.  In the future your dose may need to be changed if your kidney function or weight changes by a significant amount or as you get older.  What do you do if you miss a dose? If you miss a dose, take it as soon as you remember on the same day and resume taking twice daily.  Do not take more than one dose of ELIQUIS at the same time to make up a missed dose.  Important Safety Information A possible side effect of Eliquis is bleeding. You should call your healthcare provider right away if you experience any of the following: ? Bleeding from an injury  or your nose that does not stop. ? Unusual colored urine (red or dark brown) or unusual colored stools (red or black). ? Unusual bruising for unknown reasons. ? A serious fall or if you hit your head (even if there is no bleeding).  Some medicines may interact with Eliquis and might increase your risk of  bleeding or clotting while on Eliquis. To help avoid this, consult your healthcare provider or pharmacist prior to using any new prescription or non-prescription medications, including herbals, vitamins, non-steroidal anti-inflammatory drugs (NSAIDs) and supplements.  This website has more information on Eliquis (apixaban): http://www.eliquis.com/eliquis/home

## 2020-02-02 NOTE — Discharge Summary (Addendum)
Physician Discharge Summary   Patient ID: Cody Arellano MRN: [id number] DOB/AGE: March 07, 1946 74 y.o.  Admit date: 01/28/2020 Discharge date: 02/02/2020  Primary Care Physician:  Venia Carbon, MD   Recommendations for Outpatient Follow-up:  Follow up with PCP in 1-2 weeks Bactrim 1 tab p.o. twice daily for 5 days Sitz bath daily, dressing changes as per instructions  Home Health: Home health PT, RN Equipment/Devices: Rolling walker  Discharge Condition: stable  CODE STATUS: FULL Diet recommendation: Heart healthy diet   Discharge Diagnoses:     . sepsis secondary to Rectal abscess post I&D, present on admission  Acute respiratory failure with hypoxia . CAD (coronary artery disease) . Paroxysmal atrial fibrillation (HCC) with RVR  . Hyperlipemia History of lupus Hypotension resolved Chronic systolic CHF Hyperlipidemia  Consults: General surgery    Allergies:  No Known Allergies   DISCHARGE MEDICATIONS: Allergies as of 02/02/2020   No Known Allergies     Medication List    TAKE these medications   amiodarone 100 MG tablet Commonly known as: PACERONE Take 1 tablet (100 mg total) by mouth 2 (two) times daily. What changed:   medication strength  how much to take  how to take this  when to take this  additional instructions   apixaban 5 MG Tabs tablet Commonly known as: ELIQUIS Take 1 tablet (5 mg total) by mouth 2 (two) times daily. What changed:   medication strength  how much to take   Artificial Tears 1.4 % ophthalmic solution Generic drug: polyvinyl alcohol Place 1 drop into both eyes daily.   aspirin EC 81 MG tablet Take 1 tablet (81 mg total) by mouth daily.   azaTHIOprine 50 MG tablet Commonly known as: IMURAN Take 100 mg by mouth 2 (two) times daily.   ferrous sulfate 325 (65 FE) MG tablet Take 1 tablet (325 mg total) by mouth daily with breakfast.   hydroxychloroquine 200 MG tablet Commonly known as: PLAQUENIL Take 400  mg by mouth at bedtime.   losartan 25 MG tablet Commonly known as: COZAAR Take 0.5 tablets (12.5 mg total) by mouth daily.   oxyCODONE 5 MG immediate release tablet Commonly known as: Oxy IR/ROXICODONE Take 1 tablet (5 mg total) by mouth every 6 (six) hours as needed for severe pain.   polyethylene glycol 17 g packet Commonly known as: MIRALAX / GLYCOLAX Take 17 g by mouth daily.   rosuvastatin 20 MG tablet Commonly known as: CRESTOR Take 20 mg by mouth daily.   spironolactone 25 MG tablet Commonly known as: ALDACTONE Take 0.5 tablets (12.5 mg total) by mouth daily.   sulfamethoxazole-trimethoprim 800-160 MG tablet Commonly known as: BACTRIM DS Take 1 tablet by mouth 2 (two) times daily for 5 days.   tamsulosin 0.4 MG Caps capsule Commonly known as: FLOMAX Take 0.4 mg by mouth every evening.            Durable Medical Equipment  (From admission, onward)         Start     Ordered   02/01/20 0606  For home use only DME Walker rolling  Once       Question Answer Comment  Walker: With 5 Inch Wheels   Patient needs a walker to treat with the following condition Ambulatory dysfunction      02/01/20 0607           Discharge Care Instructions  (From admission, onward)         Start  Ordered   02/02/20 0000  Discharge wound care:       Comments: Change dry dressing over perirectal incisions twice daily and as needed if soiled. Secure with mesh underwear.  Continue sitz bath daily.   02/02/20 0859           Brief H and P: For complete details please refer to admission H and P, but in brief Cody Arellanois a 74 y.o.malewith medical history significant forcombined systolic and diastolic congestive heart failure, paroxysmal atrial fibrillation on Eliquis, CAD s/p CABG, hyperlipidemia, and BPH who presents with concerns of worsening rectal pain.  CT abdomen and pelvis scan showed multifocal abscesses surrounding the rectum and there is also prominent  prostate with ingrowth into the urinary bladder as well as a vague hypodensity in the body of the pancreas. Patient does endorse that he frequently has dysuria and dribbling urinary stream.   Post I&D and penrose drain placement on 01/29/20 by general surgery, Dr. Byerly.  Deep wound fluid growing e-coli.  Completed 4 days of Rocephin and IV Flagyl.  Switched to Bactrim DS twice daily on 02/01/20.     Hospital Course:   Sepsis 2/2 to Rectal abscesses, E. Coli. Present on admission  -Patient presented with concerns of worsening rectal pain, CT abdomen pelvis showed multifocal abscesses surrounding the rectum.  Patient met sepsis criteria with leukocytosis, tachypnea, tachycardia source likely due to the perirectal abscess -General surgery was consulted, post I&D on 01/29/2020 by Dr. Byerly. Patient was placed on empiric antibiotics on Rocephin and Flagyl. -Per general surgery, continue dressing changes, sitz bath, Bactrim DS twice daily for 5 days   Acute hypoxic respiratory failure possibly secondary to atelectasis versus fluid overload -Resolved, initially required 2 to 3 L O2 to maintain sats greater than 90% -Currently stable, O2 sats 93% on room air  -Will obtain home O2 evaluation  Paroxysmal A. fib, RVR -resumed amiodarone 100 mg twice daily -Continue Eliquis 5 mg twice daily -Patient to follow-up outpatient with his PCP and Dr. Nahser (cardiology)   History of lupus Continue Plaquenil, azathioprine  Resolved hypotension Resume home regimen, low-dose losartan, spironolactone  Iron deficiency anemia Hemoglobin of 10.1 worsening from 12.9 several months prior Iron studies suggestive of iron deficiency FOBT negative Continue p.o. ferrous sulfate 325 mg daily, laxative and stool softener  Abnormal CT scan abdomen and pelvis with contrast 01/28/20 Prostatic hypertrophy, questionable Hypodensity on head of pancreas CA 19-9, 36 on 01/29/2020, PSA normal -CT abdomen pelvis  incidentally showed prostate with ingrowth into the bladder as well as vague hypodensity in the body of the pancreas -MRI abdomen showed no acute findings, area of concern in the pancreatic body on recent CT scan shows no mass lesion by MRI, pancreas is normal.  No hydronephrosis, no prostatic enlargement mentioned on MRI  BPH Continue tamsulosin, outpatient follow-up with urology  CAD s/p CABG No chest pain or shortness of breath, continue aspirin and Crestor   Chronic systolic CHF EF of 45 to 50% on echo in 10/2019. Continue rate control with amiodarone, losartan, spironolactone   Hyperlipidemia Continue statin  Day of Discharge S: No acute complaints, looking forward to go home today.  No fevers or chills, pain controlled  BP 110/82 (BP Location: Right Arm)   Pulse (!) 103   Temp 98.3 F (36.8 C) (Oral)   Resp 20   Ht 5' 11" (1.803 m)   Wt 69.9 kg   SpO2 93%   BMI 21.49 kg/m   Physical Exam:   General: Alert and awake oriented x3 not in any acute distress. HEENT: anicteric sclera, pupils reactive to light and accommodation CVS: S1-S2 clear no murmur rubs or gallops Chest: clear to auscultation bilaterally, no wheezing rales or rhonchi Abdomen: soft nontender, nondistended, normal bowel sounds Extremities: no cyanosis, clubbing or edema noted bilaterally Neuro: Cranial nerves II-XII intact, no focal neurological deficits GU: Dressing intact   Get Medicines reviewed and adjusted: Please take all your medications with you for your next visit with your Primary MD  Please request your Primary MD to go over all hospital tests and procedure/radiological results at the follow up. Please ask your Primary MD to get all Hospital records sent to his/her office.  If you experience worsening of your admission symptoms, develop shortness of breath, life threatening emergency, suicidal or homicidal thoughts you must seek medical attention immediately by calling 911 or calling your  MD immediately  if symptoms less severe.  You must read complete instructions/literature along with all the possible adverse reactions/side effects for all the Medicines you take and that have been prescribed to you. Take any new Medicines after you have completely understood and accept all the possible adverse reactions/side effects.   Do not drive when taking pain medications.   Do not take more than prescribed Pain, Sleep and Anxiety Medications  Special Instructions: If you have smoked or chewed Tobacco  in the last 2 yrs please stop smoking, stop any regular Alcohol  and or any Recreational drug use.  Wear Seat belts while driving.  Please note  You were cared for by a hospitalist during your hospital stay. Once you are discharged, your primary care physician will handle any further medical issues. Please note that NO REFILLS for any discharge medications will be authorized once you are discharged, as it is imperative that you return to your primary care physician (or establish a relationship with a primary care physician if you do not have one) for your aftercare needs so that they can reassess your need for medications and monitor your lab values.   The results of significant diagnostics from this hospitalization (including imaging, microbiology, ancillary and laboratory) are listed below for reference.      Procedures/Studies:  MR ABDOMEN W WO CONTRAST  Result Date: 02/02/2020 CLINICAL DATA:  Hypodensity identified in the body of pancreas on CT scan of 01/28/2020. EXAM: MRI ABDOMEN WITHOUT AND WITH CONTRAST TECHNIQUE: Multiplanar multisequence MR imaging of the abdomen was performed both before and after the administration of intravenous contrast. CONTRAST:  66m GADAVIST GADOBUTROL 1 MMOL/ML IV SOLN COMPARISON:  Abdomen/pelvis CT 01/28/2020 FINDINGS: Lower chest: Small right and tiny left pleural effusions. Dependent atelectasis noted in the lower lobes bilaterally. Hepatobiliary: No  suspicious focal abnormality within the liver parenchyma. There is no evidence for gallstones, gallbladder wall thickening, or pericholecystic fluid. No intrahepatic or extrahepatic biliary dilation. Pancreas: No focal mass lesion. No dilatation of the main duct. No intraparenchymal cyst. No peripancreatic edema. Spleen:  No splenomegaly. No focal mass lesion. Adrenals/Urinary Tract: No adrenal nodule or mass. 13 mm exophytic simple cyst noted lower interpolar right kidney. 9 mm subcapsular cyst noted interpolar left kidney. Additional tiny cortical foci of non enhancement in both kidneys are too small to characterize but most likely benign. No hydronephrosis. Stomach/Bowel: Stomach is unremarkable. No gastric wall thickening. No evidence of outlet obstruction. Duodenum is normally positioned as is the ligament of Treitz. No small bowel or colonic dilatation within the visualized abdomen. Vascular/Lymphatic: No abdominal aortic aneurysm. There  is no gastrohepatic or hepatoduodenal ligament lymphadenopathy. No retroperitoneal or mesenteric lymphadenopathy. Other:  No intraperitoneal free fluid. Musculoskeletal: No focal suspicious marrow enhancement within the visualized bony anatomy. IMPRESSION: 1. No acute findings in the abdomen. 2. Area of concern in the pancreatic body on recent CT scan shows no mass lesion by MRI. Pancreas is normal on today's study. 3. Small bilateral pleural effusions with dependent atelectasis in the lower lobes bilaterally. Electronically Signed   By: Eric  Mansell M.D.   On: 02/02/2020 06:35   CT ABDOMEN PELVIS W CONTRAST  Result Date: 01/28/2020 CLINICAL DATA:  Rectal pain and swelling for 1 week with dark stools, initial encounter EXAM: CT ABDOMEN AND PELVIS WITH CONTRAST TECHNIQUE: Multidetector CT imaging of the abdomen and pelvis was performed using the standard protocol following bolus administration of intravenous contrast. CONTRAST:  100mL OMNIPAQUE IOHEXOL 300 MG/ML  SOLN  COMPARISON:  11/13/2019 FINDINGS: Lower chest: Emphysematous changes are noted in the lung bases with multiple bulla and blebs. Some associated nodular densities are noted stable in appearance from the prior exam. Hepatobiliary: No focal liver abnormality is seen. No gallstones, gallbladder wall thickening, or biliary dilatation. Pancreas: Pancreas is well visualized. There is a vague hypodensity identified in the body of the pancreas near the junction with the head suspicious for focal mass. No ductal dilatation is identified. The remainder of the pancreas is within normal limits. Spleen: Normal in size without focal abnormality. Adrenals/Urinary Tract: Adrenal glands are within normal limits. Kidneys demonstrate a normal enhancement pattern bilaterally. Exophytic cyst is noted from the right kidney laterally. No obstructive changes are seen. The bladder is partially distended. Along the inferior margin of the bladder there is significant in growth of the prostate. Possibility of prostate mass lesion could not be totally excluded. Stomach/Bowel: The rectum is decompressed. Surrounding the rectum, there are multiple peripherally enhancing fluid attenuation areas the largest of which is noted to the right of the rectum measuring approximately 6.6 x 3.7 cm. These encircle the rectum and extends superiorly adjacent to the prostate as well as the inferior aspect of the bladder posteriorly. The more proximal colon shows fecal material within although no obstructive changes are seen. The appendix is unremarkable. Small bowel and stomach appear within normal limits. Vascular/Lymphatic: Aortic calcifications are noted without aneurysmal dilatation or dissection. No sizable lymphadenopathy is noted. Reproductive: Prostate is enlarged in size with ingrowth into the inferior aspect of the bladder. The possibility of a prostate mass could not be totally excluded. Direct visualization is recommended when the patient's condition  improves. The seminal vesicles are involved with the inflammatory change surrounding the rectum. Other: No free fluid is noted.  No hernia is seen. Musculoskeletal: No acute or significant osseous findings. IMPRESSION: Multifocal peripherally enhancing fluid collections consistent with abscesses surrounding the rectum. The largest of this area measures approximately 6.6 x 3.7 cm. Multiple smaller components are noted. This extends superiorly towards the seminal vesicle on the right as well as the inferior wall of the bladder and right lateral aspect of the prostate. Prominent prostate with ingrowth into the urinary bladder. This is likely related to prosthetic hypertrophy although the possibility of a prostate mass could not be totally excluded. Direct visualization is recommended when the patient's condition improves. Vague hypodensity in the body of the pancreas as described. The possibility of an underlying mass deserves consideration. This can be evaluated on non emergent outpatient MRI to allow for optimum imaging when the patient's condition improves. Electronically Signed   By:   Inez Catalina M.D.   On: 01/28/2020 21:56      LAB RESULTS: Basic Metabolic Panel: Recent Labs  Lab 01/30/20 0321 02/01/20 0450  NA 134* 138  K 5.1 3.9  CL 102 102  CO2 22 27  GLUCOSE 137* 96  BUN 17 19  CREATININE 0.93 1.00  CALCIUM 8.6* 8.3*  MG  --  1.9   Liver Function Tests: Recent Labs  Lab 01/28/20 0749  AST 17  ALT 20  ALKPHOS 44  BILITOT 0.3  PROT 5.9*  ALBUMIN 2.2*   No results for input(s): LIPASE, AMYLASE in the last 168 hours. No results for input(s): AMMONIA in the last 168 hours. CBC: Recent Labs  Lab 01/31/20 0155 01/31/20 0155 02/01/20 0450  WBC 18.8*  --  9.5  NEUTROABS 16.3*   < > 6.9  HGB 9.8*  --  9.9*  HCT 31.1*  --  31.8*  MCV 90.1   < > 89.8  PLT 309  --  333   < > = values in this interval not displayed.   Cardiac Enzymes: No results for input(s): CKTOTAL, CKMB,  CKMBINDEX, TROPONINI in the last 168 hours. BNP: Invalid input(s): POCBNP CBG: No results for input(s): GLUCAP in the last 168 hours.     Disposition and Follow-up: Discharge Instructions    Diet - low sodium heart healthy   Complete by: As directed    Discharge wound care:   Complete by: As directed    Change dry dressing over perirectal incisions twice daily and as needed if soiled. Secure with mesh underwear.  Continue sitz bath daily.   Increase activity slowly   Complete by: As directed        DISPOSITION: Home with home health PT, RN   [facility], [place]. Go on 02/11/2020.   Specialty: General Surgery Why: 245pm. Please arrive 30 minutes prior to your appointment for paperwork. Please bring a copy of your photo ID and insurance card.  Contact information: [address] [place] [address] [phone]        Venia Carbon, MD. Schedule an appointment as soon as possible for a visit in 2 week(s).   Specialties: Internal Medicine, Pediatrics Contact information: [place] [address] [phone]        Nahser, Wonda Cheng, MD. Schedule an appointment as soon as possible for a visit in 1 week(s).   Specialty: Cardiology Why: for Afib Contact information: [address] [place] [address] [phone]        Robley Fries, MD. Schedule an appointment as soon as possible for a visit in 2 week(s).   Specialty: Urology Why: regarding enlarged prostate Contact information: [address] [place] [address] [phone]                Time coordinating discharge:  15mns   Signed:   REstill CottaM.D. [facility] 02/02/2020, 10:12 AM   Addendum:  Sepsis secondary to Rectal abscess, Ecoli was present on  admission.     REstill CottaM.D. [facility] 02/10/2020, 1:10 PM

## 2020-02-03 ENCOUNTER — Encounter: Payer: Self-pay | Admitting: *Deleted

## 2020-02-03 ENCOUNTER — Telehealth: Payer: Self-pay | Admitting: *Deleted

## 2020-02-03 DIAGNOSIS — Z951 Presence of aortocoronary bypass graft: Secondary | ICD-10-CM

## 2020-02-03 LAB — AEROBIC/ANAEROBIC CULTURE W GRAM STAIN (SURGICAL/DEEP WOUND)

## 2020-02-03 NOTE — Progress Notes (Signed)
Britney called to let us know he just got home from the hospital and is not sure how long before he can return to the program.  We will check with him at end of next week to see how he is healing.

## 2020-02-03 NOTE — Telephone Encounter (Signed)
Cody Arellano called to let us know he just got home from the hospital and is not sure how long before he can return to the program.  We will check with him at end of next week to see how he is healing.

## 2020-02-03 NOTE — Telephone Encounter (Signed)
Okay to put him in a 15 minute slot sometime next week

## 2020-02-09 ENCOUNTER — Telehealth: Payer: Self-pay

## 2020-02-09 NOTE — Telephone Encounter (Signed)
Pt is cancelling Hosp f/u for 02-11-20. Was wanting after 14th. Does he need to be seen after that? He is seeing the surgeon for follow-ups?

## 2020-02-10 ENCOUNTER — Encounter: Payer: Self-pay | Admitting: *Deleted

## 2020-02-10 DIAGNOSIS — Z951 Presence of aortocoronary bypass graft: Secondary | ICD-10-CM

## 2020-02-10 NOTE — Telephone Encounter (Signed)
If he is seeing the surgeon, and no other problems, we can hold off on OV

## 2020-02-10 NOTE — Telephone Encounter (Signed)
Left message on VM for pt that I cancelled his appt.

## 2020-02-10 NOTE — Progress Notes (Signed)
Cardiac Individual Treatment Plan  Patient Details  Name: Cody Arellano MRN: [id number] Date of Birth: 21-Dec-1945 Referring Provider:     Cardiac Rehab from 12/16/2019 in [facility] Cardiac and Pulmonary Rehab  Referring Provider Herold Harms, MD ([place])      Initial Encounter Date:    Cardiac Rehab from 12/16/2019 in [facility] Cardiac and Pulmonary Rehab  Date 12/16/19      Visit Diagnosis: S/P CABG x 2  Patient's Home Medications on Admission:  Current Outpatient Medications:  .  amiodarone (PACERONE) 100 MG tablet, Take 1 tablet (100 mg total) by mouth 2 (two) times daily., Disp: 60 tablet, Rfl: 1 .  apixaban (ELIQUIS) 5 MG TABS tablet, Take 1 tablet (5 mg total) by mouth 2 (two) times daily., Disp: 60 tablet, Rfl: 2 .  aspirin EC 81 MG tablet, Take 1 tablet (81 mg total) by mouth daily., Disp: , Rfl:  .  azaTHIOprine (IMURAN) 50 MG tablet, Take 100 mg by mouth 2 (two) times daily. , Disp: , Rfl: 2 .  ferrous sulfate 325 (65 FE) MG tablet, Take 1 tablet (325 mg total) by mouth daily with breakfast., Disp: 30 tablet, Rfl: 3 .  hydroxychloroquine (PLAQUENIL) 200 MG tablet, Take 400 mg by mouth at bedtime. , Disp: , Rfl:  .  losartan (COZAAR) 25 MG tablet, Take 0.5 tablets (12.5 mg total) by mouth daily., Disp: 30 tablet, Rfl: 1 .  oxyCODONE (OXY IR/ROXICODONE) 5 MG immediate release tablet, Take 1 tablet (5 mg total) by mouth every 6 (six) hours as needed for severe pain., Disp: 30 tablet, Rfl: 0 .  polyethylene glycol (MIRALAX / GLYCOLAX) 17 g packet, Take 17 g by mouth daily., Disp: , Rfl:  .  polyvinyl alcohol (ARTIFICIAL TEARS) 1.4 % ophthalmic solution, Place 1 drop into both eyes daily., Disp: , Rfl:  .  rosuvastatin (CRESTOR) 20 MG tablet, Take 20 mg by mouth daily., Disp: , Rfl:  .  spironolactone (ALDACTONE) 25 MG tablet, Take 0.5 tablets (12.5 mg total) by mouth daily., Disp: 30 tablet, Rfl: 1 .  tamsulosin (FLOMAX) 0.4 MG CAPS capsule, Take 0.4 mg by mouth every evening. , Disp: , Rfl:    Past Medical History: Past Medical History:  Diagnosis Date  . Anxiety   . Chronic tension headache   . Colon polyps   . Coronary artery disease 2008/2009   MI with PCI x 2, then PCI x 1 in 2009  . Depression   . Dyslipidemia   . Dysrhythmia    atrial fibrillation  . Hyperlipidemia   . Hypertension   . Lupus [facility])    sees Dr Trudie Reed  . Lupus disease of the lung   . 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 Myocardial infarction ([place]) 2008  . 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 Shortness of breath     Tobacco Use: Social History   Tobacco Use  Smoking Status Former Smoker  . Packs/day: 1.00  . Years: 20.00  . Pack years: 20.00  . Types: Cigarettes  . Quit date: 02/26/1993  . Years since quitting: 26.9  Smokeless Tobacco Never Used  Tobacco Comment   QUIT SMOKING 20 YEARS AGO    Labs: Recent Review Flowsheet Data    Labs for ITP Cardiac and Pulmonary Rehab Latest Ref Rng & Units 10/24/2019 10/25/2019 10/25/2019 10/26/2019 11/12/2019   Cholestrol 0 - 200 mg/dL - - - - -  LDLCALC 0 - 99 mg/dL - - - - -   HDL >40 mg/dL - - - - -   Trlycerides <150 mg/dL - - - - -   Hemoglobin A1c 4.8 - 5.6 % - - - - -   PHART 7.35 - 7.45 - - - - -   PCO2ART 32 - 48 mmHg - - - - -   HCO3 20.0 - 28.0 mmol/L - - - - -   TCO2 22 - 32 mmol/L - - - - 23   ACIDBASEDEF 0.0 - 2.0 mmol/L - - - - -   O2SAT % 53.3 46.8 55.3 59.4 -       Exercise Target Goals: Exercise Program Goal: Individual exercise prescription set using results from initial 6 min walk test and THRR while considering  patient's activity barriers and safety.   Exercise Prescription Goal: Initial exercise prescription builds to 30-45 minutes a day of aerobic activity, 2-3 days per week.  Home exercise guidelines will be given to patient during program as part of exercise prescription that the participant will acknowledge.   Education: Aerobic Exercise & Resistance  Training: - Gives group verbal and written instruction on the various components of exercise. Focuses on aerobic and resistive training programs and the benefits of this training and how to safely progress through these programs..   Cardiac Rehab from 01/20/2020 in [facility] Cardiac and Pulmonary Rehab  Date 01/20/20  Educator [facility]  Instruction Review Code 1- Verbalizes Understanding      Education: Exercise & Equipment Safety: - Individual verbal instruction and demonstration of equipment use and safety with use of the equipment.   Cardiac Rehab from 01/20/2020 in [facility] Cardiac and Pulmonary Rehab  Date 12/16/19  Educator [place]  Instruction Review Code 1- Verbalizes Understanding      Education: Exercise Physiology & General Exercise Guidelines: - Group verbal and written instruction with models to review the exercise physiology of the cardiovascular system and associated critical values. Provides general exercise guidelines with specific guidelines to those with heart or lung disease.    Education: Flexibility, Balance, Mind/Body Relaxation: Provides group verbal/written instruction on the benefits of flexibility and balance training, including mind/body exercise modes such as yoga, pilates and tai chi.  Demonstration and skill practice provided.   Activity Barriers & Risk Stratification:  Activity Barriers & Cardiac Risk Stratification - 12/11/19 1343      Activity Barriers & Cardiac Risk Stratification   Activity Barriers Balance Concerns;History of Falls;Muscular Weakness;Other (comment)    Comments lupus- toes and ankle numb; trembling (started within the last month)    Cardiac Risk Stratification High           6 Minute Walk:  6 Minute Walk    Row Name 12/16/19 1308         6 Minute Walk   Phase Initial     Distance 1100 feet     Walk Time 6 minutes     # of Rest Breaks 0     MPH 2.08     METS 2.37     RPE 11     Perceived Dyspnea  1     VO2 Peak 8.32     Symptoms No      Resting HR 60 bpm     Resting BP 90/52     Resting Oxygen Saturation  95 %     Exercise Oxygen Saturation  during 6 min walk 98 %     Max Ex. HR 69  bpm     Max Ex. BP 110/58     2 Minute Post BP 96/60            Oxygen Initial Assessment:   Oxygen Re-Evaluation:   Oxygen Discharge (Final Oxygen Re-Evaluation):   Initial Exercise Prescription:  Initial Exercise Prescription - 12/17/19 0700      Date of Initial Exercise RX and Referring Provider   Date 12/16/19    Referring Provider Herold Harms, MD ([place])      Treadmill   MPH 1.5    Grade 0    Minutes 15    METs 2.15      Recumbant Bike   Level 1    RPM 60    Watts 10    Minutes 15    METs 2.3      NuStep   Level 1    SPM 80    Minutes 15    METs 2.3      REL-XR   Level 1    Speed 50    Minutes 15    METs 2.3      T5 Nustep   Level 1    SPM 80    Minutes 15    METs 2.3      Prescription Details   Frequency (times per week) 3      Intensity   THRR 40-80% of Max Heartrate 94-128    Ratings of Perceived Exertion 11-13    Perceived Dyspnea 0-4      Progression   Progression Continue to progress workloads to maintain intensity without signs/symptoms of physical distress.      Resistance Training   Training Prescription Yes    Weight 3 lb    Reps 10-15           Perform Capillary Blood Glucose checks as needed.  Exercise Prescription Changes:  Exercise Prescription Changes    Row Name 12/16/19 1300 12/16/19 1500 12/22/19 1200 01/18/20 1700 02/01/20 1500     Response to Exercise   Blood Pressure (Admit) 90/52 90/52 110/58 98/60 90/42    Blood Pressure (Exercise) 110/58 110/58 106/56 108/58 124/70   Blood Pressure (Exit) 96/60 96/60 102/62 106/68 90/42   Heart Rate (Admit) 60 bpm 60 bpm 76 bpm 71 bpm 73 bpm   Heart Rate (Exercise) 69 bpm 69 bpm 94 bpm 84 bpm 91 bpm   Heart Rate (Exit) 64 bpm 64 bpm 63 bpm 68 bpm 72 bpm   Oxygen Saturation (Admit) 95 % 95 % -- -- --   Oxygen  Saturation (Exercise) 98 % 98 % -- -- --   Oxygen Saturation (Exit) 97 % 97 % -- -- --   Rating of Perceived Exertion (Exercise) 11 11 12 13 12    Perceived Dyspnea (Exercise) 1 1 -- -- --   Symptoms none none none none none   Comments Walk Test Results Walk Test Results -- -- --   Duration -- -- Progress to 30 minutes of  aerobic without signs/symptoms of physical distress Continue with 30 min of aerobic exercise without signs/symptoms of physical distress. Continue with 30 min of aerobic exercise without signs/symptoms of physical distress.   Intensity -- -- THRR unchanged THRR unchanged THRR unchanged     Progression   Progression -- -- Continue to progress workloads to maintain intensity without signs/symptoms of physical distress. Continue to progress workloads to maintain intensity without signs/symptoms of physical distress. Continue to progress workloads to maintain intensity without signs/symptoms of physical distress.  Average METs -- -- 2.66 2.4 2.07     Resistance Training   Training Prescription -- -- Yes Yes Yes   Weight 3 lb 3 lb 3 lb 3 lb 3 lb   Reps 10-15 10-15 10-15 10-15 10-15     Interval Training   Interval Training No No No No No     Treadmill   MPH 1.5 1.5 -- -- 2.1   Grade 0 0 -- -- 0   Minutes 15 15 -- -- 15   METs 2.15 2.15 -- -- 2.61     Recumbant Bike   Level $Remo'1 1 1 3 3   'LNThc$ RPM 60 60 60 60 --   Watts 10 10 -- -- 12   Minutes $Remove'15 15 15 15 15   'JPncwyA$ METs 2.3 2.3 3.12 3.12 2.67     NuStep   Level $Remo'1 1 1 4 4   'DyNAS$ SPM 80 80 80 80 --   Minutes $Remove'15 15 15 15 15   'rvIWEVU$ METs 2.3 2.3 2.2 1.7 1.5     REL-XR   Level 1 1 -- -- --   Speed -- 50 -- -- --   Minutes 15 15 -- -- --   METs 2.3 2.3 -- -- --     T5 Nustep   Level 1 1 -- -- 3   SPM 80 80 -- -- --   Minutes 15 15 -- -- 15   METs 2.3 2.3 -- -- 1.7          Exercise Comments:   Exercise Goals and Review:  Exercise Goals    Row Name 12/16/19 1321             Exercise Goals   Increase Physical  Activity Yes       Intervention Provide advice, education, support and counseling about physical activity/exercise needs.;Develop an individualized exercise prescription for aerobic and resistive training based on initial evaluation findings, risk stratification, comorbidities and participant's personal goals.       Expected Outcomes Short Term: Attend rehab on a regular basis to increase amount of physical activity.;Long Term: Add in home exercise to make exercise part of routine and to increase amount of physical activity.;Long Term: Exercising regularly at least 3-5 days a week.       Increase Strength and Stamina Yes       Intervention Provide advice, education, support and counseling about physical activity/exercise needs.;Develop an individualized exercise prescription for aerobic and resistive training based on initial evaluation findings, risk stratification, comorbidities and participant's personal goals.       Expected Outcomes Short Term: Increase workloads from initial exercise prescription for resistance, speed, and METs.;Long Term: Improve cardiorespiratory fitness, muscular endurance and strength as measured by increased METs and functional capacity (6MWT);Short Term: Perform resistance training exercises routinely during rehab and add in resistance training at home       Able to understand and use rate of perceived exertion (RPE) scale Yes       Intervention Provide education and explanation on how to use RPE scale       Expected Outcomes Short Term: Able to use RPE daily in rehab to express subjective intensity level;Long Term:  Able to use RPE to guide intensity level when exercising independently       Able to understand and use Dyspnea scale Yes       Intervention Provide education and explanation on how to use Dyspnea scale       Expected Outcomes Short  Term: Able to use Dyspnea scale daily in rehab to express subjective sense of shortness of breath during exertion;Long Term: Able to  use Dyspnea scale to guide intensity level when exercising independently       Knowledge and understanding of Target Heart Rate Range (THRR) Yes       Intervention Provide education and explanation of THRR including how the numbers were predicted and where they are located for reference       Expected Outcomes Short Term: Able to state/look up THRR;Short Term: Able to use daily as guideline for intensity in rehab;Long Term: Able to use THRR to govern intensity when exercising independently       Able to check pulse independently Yes       Intervention Provide education and demonstration on how to check pulse in carotid and radial arteries.;Review the importance of being able to check your own pulse for safety during independent exercise       Expected Outcomes Short Term: Able to explain why pulse checking is important during independent exercise;Long Term: Able to check pulse independently and accurately       Understanding of Exercise Prescription Yes       Intervention Provide education, explanation, and written materials on patient's individual exercise prescription       Expected Outcomes Short Term: Able to explain program exercise prescription;Long Term: Able to explain home exercise prescription to exercise independently              Exercise Goals Re-Evaluation :  Exercise Goals Re-Evaluation    Row Name 12/22/19 1248 01/04/20 1116 01/18/20 1730 02/01/20 1521       Exercise Goal Re-Evaluation   Exercise Goals Review Increase Physical Activity;Increase Strength and Stamina;Understanding of Exercise Prescription Increase Physical Activity;Increase Strength and Stamina;Understanding of Exercise Prescription Increase Physical Activity;Increase Strength and Stamina;Understanding of Exercise Prescription Increase Physical Activity;Increase Strength and Stamina;Understanding of Exercise Prescription    Comments First full day of exercise!  Patient was oriented to gym and equipment including  functions, settings, policies, and procedures.  Patient's individual exercise prescription and treatment plan were reviewed.  All starting workloads were established based on the results of the 6 minute walk test done at initial orientation visit.  The plan for exercise progression was also introduced and progression will be customized based on patient's performance and goals. Kaylee has been coming to rehab, but has been missing some sessions. He feels better and is ready to exercise again. Ivery does not currently do exercise at home but staff will review home exercise instructions when ready. Kayceon is up to level 4 on NS.  he works at [employer].  Staff will monitor progress. Currently in hospital for sepsis.  He will need clearance to return.    Expected Outcomes Short: Use RPE daily to regulate intensity. Long: Follow program prescription in THR. Short: Review home exercise Long: Able to exercise indepedently at home following THR Short:  continue to increase levels on machines Long:  improve overal stamina and MET level Short: Clearance to return to rehab Long: Continue to improve stamina.           Discharge Exercise Prescription (Final Exercise Prescription Changes):  Exercise Prescription Changes - 02/01/20 1500      Response to Exercise   Blood Pressure (Admit) 90/42    Blood Pressure (Exercise) 124/70    Blood Pressure (Exit) 90/42    Heart Rate (Admit) 73 bpm    Heart Rate (Exercise) 91 bpm  Heart Rate (Exit) 72 bpm    Rating of Perceived Exertion (Exercise) 12    Symptoms none    Duration Continue with 30 min of aerobic exercise without signs/symptoms of physical distress.    Intensity THRR unchanged      Progression   Progression Continue to progress workloads to maintain intensity without signs/symptoms of physical distress.    Average METs 2.07      Resistance Training   Training Prescription Yes    Weight 3 lb    Reps 10-15      Interval Training   Interval Training  No      Treadmill   MPH 2.1    Grade 0    Minutes 15    METs 2.61      Recumbant Bike   Level 3    Watts 12    Minutes 15    METs 2.67      NuStep   Level 4    Minutes 15    METs 1.5      T5 Nustep   Level 3    Minutes 15    METs 1.7           Nutrition:  Target Goals: Understanding of nutrition guidelines, daily intake of sodium '1500mg'$ , cholesterol '200mg'$ , calories 30% from fat and 7% or less from saturated fats, daily to have 5 or more servings of fruits and vegetables.  Education: Controlling Sodium/Reading Food Labels -Group verbal and written material supporting the discussion of sodium use in heart healthy nutrition. Review and explanation with models, verbal and written materials for utilization of the food label.   Education: General Nutrition Guidelines/Fats and Fiber: -Group instruction provided by verbal, written material, models and posters to present the general guidelines for heart healthy nutrition. Gives an explanation and review of dietary fats and fiber.   Biometrics:  Pre Biometrics - 12/16/19 1310      Pre Biometrics   Height $Remov'5\' 11"'sjoQbA$  (1.803 m)    Weight 154 lb 1.6 oz (69.9 kg)    BMI (Calculated) 21.5    Single Leg Stand 14.28 seconds            Nutrition Therapy Plan and Nutrition Goals:  Nutrition Therapy & Goals - 01/18/20 1431      Nutrition Therapy   Diet Low Na, heart healthy, high kcal, high protein    Protein (specify units) 60-65g    Fiber 30 grams    Whole Grain Foods 3 servings    Saturated Fats 12 max. grams    Fruits and Vegetables 5 servings/day    Sodium 1.5 grams      Personal Nutrition Goals   Nutrition Goal ST: ensure BID, add heart healthy fats like soft margerine to popcorn, add small frequent meals LT: improve sleep, feel better during the day, golf and go out to have dinner with his wife and play with his grandkids    Comments Pt primary diagnosis for Cardiac Rehab is S/P CABG x2. Pt also presents with CAD,  a-fib, STEMI, lupus pericarditis, interstitial lung disease, HLD, memory loss. Per recent office visit with the cardiothorasic doctor he continues to lose weight due to low intake <1200kcal/day and he is extrememly fatigued. Current weight 69.9kg. 73.5kg 11/16/19 - now 69.9.Current Relevant Medications: cozaar, zoloft, oxycodone, crestor. Pt reports going downhill after his surgery. Plans to go to [place] in November to see his new grandchild. Pt reports his appetite is low unless he loves the food. B: three sausage,  waffles, eggs and coffee and orange juice --> two links, egg, and waffle. May not eat until 4pm now - if it weren't for the medicine he wouldn't eat anything at night. Pt reports having ensure to help. D: cabbage and smoked sausage and cornbread and broccoli or spaghetti or baked chicken and mashed potatoes. He reports not consistently finishing his meals at night. Pt would like to take ensure BID. Discussed eart healthy eating and some high calorie, high protein MNT tactics so that he will not continue to lose weight and meet his needs.      Intervention Plan   Intervention Prescribe, educate and counsel regarding individualized specific dietary modifications aiming towards targeted core components such as weight, hypertension, lipid management, diabetes, heart failure and other comorbidities.;Nutrition handout(s) given to patient.    Expected Outcomes Short Term Goal: Understand basic principles of dietary content, such as calories, fat, sodium, cholesterol and nutrients.;Short Term Goal: A plan has been developed with personal nutrition goals set during dietitian appointment.;Long Term Goal: Adherence to prescribed nutrition plan.           Nutrition Assessments:  Nutrition Assessments - 12/16/19 1527      MEDFICTS Scores   Pre Score 25           MEDIFICTS Score Key:          ?70 Need to make dietary changes          40-70 Heart Healthy Diet         ? 40 Therapeutic Level  Cholesterol Diet  Nutrition Goals Re-Evaluation:   Nutrition Goals Discharge (Final Nutrition Goals Re-Evaluation):   Psychosocial: Target Goals: Acknowledge presence or absence of significant depression and/or stress, maximize coping skills, provide positive support system. Participant is able to verbalize types and ability to use techniques and skills needed for reducing stress and depression.   Education: Depression - Provides group verbal and written instruction on the correlation between heart/lung disease and depressed mood, treatment options, and the stigmas associated with seeking treatment.   Cardiac Rehab from 01/20/2020 in [facility] Cardiac and Pulmonary Rehab  Date 01/06/20  Educator [facility]  Instruction Review Code 1- [employer] Understanding      Education: Sleep Hygiene -Provides group verbal and written instruction about how sleep can affect your health.  Define sleep hygiene, discuss sleep cycles and impact of sleep habits. Review good sleep hygiene tips.     Education: Stress and Anxiety: - Provides group verbal and written instruction about the health risks of elevated stress and causes of high stress.  Discuss the correlation between heart/lung disease and anxiety and treatment options. Review healthy ways to manage with stress and anxiety.   Cardiac Rehab from 01/20/2020 in [facility] Cardiac and Pulmonary Rehab  Date 01/06/20  Educator [facility]  Instruction Review Code 1- Verbalizes Understanding       Initial Review & Psychosocial Screening:  Initial Psych Review & Screening - 12/11/19 1338      Initial Review   Current issues with Current Stress Concerns    Source of Stress Concerns Chronic Illness      Family Dynamics   Good Support System? Yes   wife, sons, grandkids     Barriers   Psychosocial barriers to participate in program There are no identifiable barriers or psychosocial needs.      Screening Interventions   Interventions Encouraged to exercise;To provide  support and resources with identified psychosocial needs;Provide feedback about the scores to participant    Expected Outcomes  Short Term goal: Utilizing psychosocial counselor, staff and physician to assist with identification of specific Stressors or current issues interfering with healing process. Setting desired goal for each stressor or current issue identified.;Long Term Goal: Stressors or current issues are controlled or eliminated.;Short Term goal: Identification and review with participant of any Quality of Life or Depression concerns found by scoring the questionnaire.;Long Term goal: The participant improves quality of Life and PHQ9 Scores as seen by post scores and/or verbalization of changes           Quality of Life Scores:   Quality of Life - 12/16/19 1526      Quality of Life   Select Quality of Life      Quality of Life Scores   Health/Function Pre 14.9 %    Socioeconomic Pre 22 %    Psych/Spiritual Pre 23.93 %    Family Pre 24 %    GLOBAL Pre 19.56 %          Scores of 19 and below usually indicate a poorer quality of life in these areas.  A difference of  2-3 points is a clinically meaningful difference.  A difference of 2-3 points in the total score of the Quality of Life Index has been associated with significant improvement in overall quality of life, self-image, physical symptoms, and general health in studies assessing change in quality of life.  PHQ-9: Recent Review Flowsheet Data    Depression screen [facility] 2/9 01/13/2020 12/16/2019 01/20/2019 01/09/2018   Decreased Interest 0 0 0 0   Down, Depressed, Hopeless 1 1 1  0   PHQ - 2 Score 1 1 1  0   Altered sleeping 2 3 - -   Tired, decreased energy 2 3 - -   Change in appetite 1 1 - -   Feeling bad or failure about yourself  0 2 - -   Trouble concentrating 0 1 - -   Moving slowly or fidgety/restless 0 0 - -   Suicidal thoughts 0 0 - -   PHQ-9 Score 6 11 - -   Difficult doing work/chores Somewhat difficult Somewhat  difficult - -     Interpretation of Total Score  Total Score Depression Severity:  1-4 = Minimal depression, 5-9 = Mild depression, 10-14 = Moderate depression, 15-19 = Moderately severe depression, 20-27 = Severe depression   Psychosocial Evaluation and Intervention:  Psychosocial Evaluation - 01/22/20 1130      Psychosocial Evaluation & Interventions   Interventions Encouraged to exercise with the program and follow exercise prescription    Comments Patient reports no issues with their current mental states, sleep, stress, depression or anxiety. Will follow up with patient in a few weeks for any changes.    Expected Outcomes Short: Continue to exercise regularly to support mental health and notify staff of any changes. Long: maintain mental health and well being through teaching of rehab or prescribed medications independently.    Continue Psychosocial Services  Follow up required by staff           Psychosocial Re-Evaluation:  Psychosocial Re-Evaluation    [place] Name 01/04/20 1134 01/13/20 1138           Psychosocial Re-Evaluation   Current issues with -- Current Stress Concerns      Comments Braxley struggles staying asleep, but also has been sick so he thinks that was has been contributing to it. Overall, he reports feeling well mentally. He states that he is getting around better. Patient's PHQ9  improved and he states its from his family being more supportive. He feels liberated that he has the support and he continues to enjoy exercising      Expected Outcomes Short: Utilize exercise to help maintain better sleep patterns Long: Maintain positive attitude Short: continue to come to cardiac rehab for exercise. Long: develop positive self care habits      Continue Psychosocial Services  Follow up required by staff Follow up required by staff             Psychosocial Discharge (Final Psychosocial Re-Evaluation):  Psychosocial Re-Evaluation - 01/13/20 1138      Psychosocial  Re-Evaluation   Current issues with Current Stress Concerns    Comments Patient's PHQ9 improved and he states its from his family being more supportive. He feels liberated that he has the support and he continues to enjoy exercising    Expected Outcomes Short: continue to come to cardiac rehab for exercise. Long: develop positive self care habits    Continue Psychosocial Services  Follow up required by staff           Vocational Rehabilitation: Provide vocational rehab assistance to qualifying candidates.   Vocational Rehab Evaluation & Intervention:  Vocational Rehab - 12/11/19 1338      Initial Vocational Rehab Evaluation & Intervention   Assessment shows need for Vocational Rehabilitation No           Education: Education Goals: Education classes will be provided on a variety of topics geared toward better understanding of heart health and risk factor modification. Participant will state understanding/return demonstration of topics presented as noted by education test scores.  Learning Barriers/Preferences:  Learning Barriers/Preferences - 12/11/19 1338      Learning Barriers/Preferences   Learning Barriers None    Learning Preferences None           General Cardiac Education Topics:  AED/CPR: - Group verbal and written instruction with the use of models to demonstrate the basic use of the AED with the basic ABC's of resuscitation.   Anatomy & Physiology of the Heart: - Group verbal and written instruction and models provide basic cardiac anatomy and physiology, with the coronary electrical and arterial systems. Review of Valvular disease and Heart Failure   Cardiac Procedures: - Group verbal and written instruction to review commonly prescribed medications for heart disease. Reviews the medication, class of the drug, and side effects. Includes the steps to properly store meds and maintain the prescription regimen. (beta blockers and nitrates)   Cardiac  Medications I: - Group verbal and written instruction to review commonly prescribed medications for heart disease. Reviews the medication, class of the drug, and side effects. Includes the steps to properly store meds and maintain the prescription regimen.   Cardiac Medications II: -Group verbal and written instruction to review commonly prescribed medications for heart disease. Reviews the medication, class of the drug, and side effects. (all other drug classes)   Cardiac Rehab from 01/20/2020 in [facility] Cardiac and Pulmonary Rehab  Date 01/13/20  Educator [facility]  Instruction Review Code 1- Verbalizes Understanding       Go Sex-Intimacy & Heart Disease, Get SMART - Goal Setting: - Group verbal and written instruction through game format to discuss heart disease and the return to sexual intimacy. Provides group verbal and written material to discuss and apply goal setting through the application of the S.M.A.R.T. Method.   Other Matters of the Heart: - Provides group verbal, written materials and models to describe Stable  Angina and Peripheral Artery. Includes description of the disease process and treatment options available to the cardiac patient.   Infection Prevention: - Provides verbal and written material to individual with discussion of infection control including proper hand washing and proper equipment cleaning during exercise session.   Cardiac Rehab from 01/20/2020 in [facility] Cardiac and Pulmonary Rehab  Date 12/16/19  Educator [place]  Instruction Review Code 1- Verbalizes Understanding      Falls Prevention: - Provides verbal and written material to individual with discussion of falls prevention and safety.   Cardiac Rehab from 01/20/2020 in [facility] Cardiac and Pulmonary Rehab  Date 12/16/19  Educator [place]  Instruction Review Code 1- Verbalizes Understanding      Other: -Provides group and verbal instruction on various topics (see comments)   Knowledge Questionnaire Score:   Knowledge Questionnaire Score - 12/16/19 1325      Knowledge Questionnaire Score   Pre Score 21/26: Heart Failure, Nutrition, Exercise           Core Components/Risk Factors/Patient Goals at Admission:  Personal Goals and Risk Factors at Admission - 12/16/19 1322      Core Components/Risk Factors/Patient Goals on Admission    Weight Management Weight Maintenance;Yes    Intervention Weight Management: Develop a combined nutrition and exercise program designed to reach desired caloric intake, while maintaining appropriate intake of nutrient and fiber, sodium and fats, and appropriate energy expenditure required for the weight goal.;Weight Management: Provide education and appropriate resources to help participant work on and attain dietary goals.;Weight Management/Obesity: Establish reasonable short term and long term weight goals.    Admit Weight 154 lb 1.6 oz (69.9 kg)    Goal Weight: Short Term 154 lb 1.6 oz (69.9 kg)    Goal Weight: Long Term 154 lb 1.6 oz (69.9 kg)    Expected Outcomes Short Term: Continue to assess and modify interventions until short term weight is achieved;Long Term: Adherence to nutrition and physical activity/exercise program aimed toward attainment of established weight goal;Weight Maintenance: Understanding of the daily nutrition guidelines, which includes 25-35% calories from fat, 7% or less cal from saturated fats, less than $RemoveB'200mg'MKWCUshi$  cholesterol, less than 1.5gm of sodium, & 5 or more servings of fruits and vegetables daily;Understanding recommendations for meals to include 15-35% energy as protein, 25-35% energy from fat, 35-60% energy from carbohydrates, less than $RemoveB'200mg'aEGlwmws$  of dietary cholesterol, 20-35 gm of total fiber daily;Understanding of distribution of calorie intake throughout the day with the consumption of 4-5 meals/snacks    Hypertension Yes    Intervention Provide education on lifestyle modifcations including regular physical activity/exercise, weight  management, moderate sodium restriction and increased consumption of fresh fruit, vegetables, and low fat dairy, alcohol moderation, and smoking cessation.;Monitor prescription use compliance.    Expected Outcomes Short Term: Continued assessment and intervention until BP is < 140/57mm HG in hypertensive participants. < 130/60mm HG in hypertensive participants with diabetes, heart failure or chronic kidney disease.;Long Term: Maintenance of blood pressure at goal levels.    Lipids Yes    Intervention Provide education and support for participant on nutrition & aerobic/resistive exercise along with prescribed medications to achieve LDL '70mg'$ , HDL >$Remo'40mg'ERwdP$ .    Expected Outcomes Short Term: Participant states understanding of desired cholesterol values and is compliant with medications prescribed. Participant is following exercise prescription and nutrition guidelines.;Long Term: Cholesterol controlled with medications as prescribed, with individualized exercise RX and with personalized nutrition plan. Value goals: LDL < $Rem'70mg'uYoG$ , HDL > 40 mg.  Education:Diabetes - Individual verbal and written instruction to review signs/symptoms of diabetes, desired ranges of glucose level fasting, after meals and with exercise. Acknowledge that pre and post exercise glucose checks will be done for 3 sessions at entry of program.   Education: Know Your Numbers and Risk Factors: -Group verbal and written instruction about important numbers in your health.  Discussion of what are risk factors and how they play a role in the disease process.  Review of Cholesterol, Blood Pressure, Diabetes, and BMI and the role they play in your overall health.   Cardiac Rehab from 01/20/2020 in [facility] Cardiac and Pulmonary Rehab  Date 01/13/20  Educator [facility]  Instruction Review Code 1- Verbalizes Understanding      Core Components/Risk Factors/Patient Goals Review:   Goals and Risk Factor Review    Row Name 01/04/20 1121 01/22/20  1128           Core Components/Risk Factors/Patient Goals Review   Personal Goals Review Weight Management/Obesity;Hypertension;Lipids Weight Management/Obesity;Hypertension;Lipids      Review Geovonni has been doing well, weight has been maintained. BPs at rehab have been stable. He reports he has a BP monitor at home but is unsure if it works. Encouraged to bring it to rehab to check function. K[place]alu states he will buy a new one if it continues to not work. Him and his wife cook home a lot and has been eating fairly healthy. Travoris would like to gain some weight. He has spoke to the dietician about his diet and is ready some changes. He wants to be around 165 pounds for his weight. He is currently at 150 pounds. He has lost 4 pounds sice he has started the program. His blood pressure has been stable and has not been hypotensive.      Expected Outcomes Short: Check BP at home, obtain new monitor if it continues to not work Long: Continue to manage lifestyle risk factors Short: use diet changes to gain some weight. Long: Gain weight of 165 pounds.             Core Components/Risk Factors/Patient Goals at Discharge (Final Review):   Goals and Risk Factor Review - 01/22/20 1128      Core Components/Risk Factors/Patient Goals Review   Personal Goals Review Weight Management/Obesity;Hypertension;Lipids    Review Beni would like to gain some weight. He has spoke to the dietician about his diet and is ready some changes. He wants to be around 165 pounds for his weight. He is currently at 150 pounds. He has lost 4 pounds sice he has started the program. His blood pressure has been stable and has not been hypotensive.    Expected Outcomes Short: use diet changes to gain some weight. Long: Gain weight of 165 pounds.           ITP Comments:  ITP Comments    Row Name 12/11/19 1354 12/16/19 1307 01/13/20 0728 01/18/20 1726 02/01/20 1520   ITP Comments Initial orientation completed. Diagnosis can be  found in [facility] 5/15. EP orientation scheduled for Wednesday, 7/14 at 11 am. Completed and gym orientation. Initial ITP created and sent for review to Dr. Bethann Punches, Medical Director. 30 Day review completed. Medical Director ITP review done, changes made as directed, and signed approval by Medical Director. Completed initial RD evaluation Currently hospitalized for sepsis.   Row Name 02/03/20 1135 02/10/20 1638         ITP Comments Hollie called to let us know he just  got home from the hospital and is not sure how long before he can return to the program.  We will check with him at end of next week to see how he is healing. 30 day review completed. ITP sent to Dr. Emily Filbert, Medical Director of Cardiac and Pulmonary Rehab. Continue with ITP unless changes are made by physician.             Comments: 30 day review

## 2020-02-11 ENCOUNTER — Ambulatory Visit: Payer: Federal, State, Local not specified - PPO | Admitting: Internal Medicine

## 2020-02-11 ENCOUNTER — Telehealth: Payer: Self-pay

## 2020-02-11 NOTE — Telephone Encounter (Signed)
LMOM

## 2020-02-15 ENCOUNTER — Encounter: Payer: Federal, State, Local not specified - PPO | Admitting: Thoracic Surgery (Cardiothoracic Vascular Surgery)

## 2020-02-16 ENCOUNTER — Encounter: Payer: Self-pay | Admitting: Thoracic Surgery (Cardiothoracic Vascular Surgery)

## 2020-02-16 NOTE — Progress Notes (Signed)
This encounter was created in error - please disregard.

## 2020-02-17 ENCOUNTER — Encounter: Payer: Federal, State, Local not specified - PPO | Attending: Internal Medicine

## 2020-02-17 DIAGNOSIS — F419 Anxiety disorder, unspecified: Secondary | ICD-10-CM

## 2020-02-17 DIAGNOSIS — D72829 Elevated white blood cell count, unspecified: Secondary | ICD-10-CM

## 2020-02-17 DIAGNOSIS — I252 Old myocardial infarction: Secondary | ICD-10-CM | POA: Diagnosis not present

## 2020-02-17 DIAGNOSIS — Z9181 History of falling: Secondary | ICD-10-CM

## 2020-02-17 DIAGNOSIS — I700 Atherosclerosis of aorta: Secondary | ICD-10-CM

## 2020-02-17 DIAGNOSIS — N401 Enlarged prostate with lower urinary tract symptoms: Secondary | ICD-10-CM

## 2020-02-17 DIAGNOSIS — F329 Major depressive disorder, single episode, unspecified: Secondary | ICD-10-CM

## 2020-02-17 DIAGNOSIS — J9811 Atelectasis: Secondary | ICD-10-CM

## 2020-02-17 DIAGNOSIS — E785 Hyperlipidemia, unspecified: Secondary | ICD-10-CM

## 2020-02-17 DIAGNOSIS — Z951 Presence of aortocoronary bypass graft: Secondary | ICD-10-CM

## 2020-02-17 DIAGNOSIS — Z7901 Long term (current) use of anticoagulants: Secondary | ICD-10-CM

## 2020-02-17 DIAGNOSIS — D509 Iron deficiency anemia, unspecified: Secondary | ICD-10-CM

## 2020-02-17 DIAGNOSIS — I499 Cardiac arrhythmia, unspecified: Secondary | ICD-10-CM

## 2020-02-17 DIAGNOSIS — I2510 Atherosclerotic heart disease of native coronary artery without angina pectoris: Secondary | ICD-10-CM | POA: Diagnosis not present

## 2020-02-17 DIAGNOSIS — A4151 Sepsis due to Escherichia coli [E. coli]: Secondary | ICD-10-CM | POA: Diagnosis not present

## 2020-02-17 DIAGNOSIS — Z87891 Personal history of nicotine dependence: Secondary | ICD-10-CM

## 2020-02-17 DIAGNOSIS — I5042 Chronic combined systolic (congestive) and diastolic (congestive) heart failure: Secondary | ICD-10-CM

## 2020-02-17 DIAGNOSIS — R39198 Other difficulties with micturition: Secondary | ICD-10-CM

## 2020-02-17 DIAGNOSIS — I480 Paroxysmal atrial fibrillation: Secondary | ICD-10-CM

## 2020-02-17 DIAGNOSIS — K635 Polyp of colon: Secondary | ICD-10-CM

## 2020-02-17 DIAGNOSIS — Z7982 Long term (current) use of aspirin: Secondary | ICD-10-CM

## 2020-02-17 DIAGNOSIS — I110 Hypertensive heart disease with heart failure: Secondary | ICD-10-CM

## 2020-02-17 DIAGNOSIS — K5900 Constipation, unspecified: Secondary | ICD-10-CM

## 2020-02-17 DIAGNOSIS — J439 Emphysema, unspecified: Secondary | ICD-10-CM

## 2020-02-17 DIAGNOSIS — K611 Rectal abscess: Secondary | ICD-10-CM | POA: Diagnosis not present

## 2020-02-17 DIAGNOSIS — N281 Cyst of kidney, acquired: Secondary | ICD-10-CM

## 2020-02-17 DIAGNOSIS — G44209 Tension-type headache, unspecified, not intractable: Secondary | ICD-10-CM

## 2020-02-17 DIAGNOSIS — M329 Systemic lupus erythematosus, unspecified: Secondary | ICD-10-CM

## 2020-02-24 ENCOUNTER — Telehealth: Payer: Self-pay

## 2020-02-24 NOTE — Telephone Encounter (Signed)
Called to check in, last contact 9/1 when he had just got home from the hospital. [facility]

## 2020-03-02 ENCOUNTER — Telehealth: Payer: Self-pay

## 2020-03-02 NOTE — Telephone Encounter (Signed)
Called and spoke with patient. He is still recovering from surgery and is unsure when he is able to come back to [facility]. Patient states doctor is restricting lifting and driving at this time. Informed patient we will wait a couple weeks, re-assess at that time. Will talk about early discharge if needed.

## 2020-03-08 ENCOUNTER — Telehealth: Payer: Self-pay

## 2020-03-08 NOTE — Telephone Encounter (Signed)
That is okay.

## 2020-03-08 NOTE — Telephone Encounter (Signed)
Left verbal orders on verified VM

## 2020-03-08 NOTE — Telephone Encounter (Signed)
Pt said his physical therapist told him he needs to be checked out as soon as possible. Pt wants appointment with Dr. Silvio Pate but there aren't any available appointments. He said he is having nausea, no energy, and his blood pressure is low. Pt is asking if he can't be worked in to see Dr. Silvio Pate so he doesn't end back up in the hospital. He would like a call back.

## 2020-03-08 NOTE — Telephone Encounter (Signed)
Needs verbal orders for PT 1 x week 2 weeks

## 2020-03-09 ENCOUNTER — Ambulatory Visit (INDEPENDENT_AMBULATORY_CARE_PROVIDER_SITE_OTHER)
Admission: RE | Admit: 2020-03-09 | Discharge: 2020-03-09 | Disposition: A | Discharge status: Home / Self Care | Payer: Federal, State, Local not specified - PPO | Source: Ambulatory Visit | Attending: Internal Medicine | Admitting: Internal Medicine

## 2020-03-09 ENCOUNTER — Other Ambulatory Visit: Payer: Self-pay

## 2020-03-09 ENCOUNTER — Encounter: Payer: Self-pay | Admitting: Internal Medicine

## 2020-03-09 ENCOUNTER — Encounter: Payer: Self-pay | Admitting: *Deleted

## 2020-03-09 ENCOUNTER — Ambulatory Visit: Payer: Federal, State, Local not specified - PPO | Admitting: Internal Medicine

## 2020-03-09 VITALS — BP 102/68 | HR 72 | Temp 97.8°F | Ht 72.0 in | Wt 157.0 lb

## 2020-03-09 DIAGNOSIS — R5383 Other fatigue: Secondary | ICD-10-CM | POA: Diagnosis not present

## 2020-03-09 DIAGNOSIS — J181 Lobar pneumonia, unspecified organism: Secondary | ICD-10-CM

## 2020-03-09 DIAGNOSIS — I480 Paroxysmal atrial fibrillation: Secondary | ICD-10-CM | POA: Diagnosis not present

## 2020-03-09 DIAGNOSIS — E43 Unspecified severe protein-calorie malnutrition: Secondary | ICD-10-CM | POA: Insufficient documentation

## 2020-03-09 DIAGNOSIS — I25119 Atherosclerotic heart disease of native coronary artery with unspecified angina pectoris: Secondary | ICD-10-CM

## 2020-03-09 DIAGNOSIS — E441 Mild protein-calorie malnutrition: Secondary | ICD-10-CM

## 2020-03-09 DIAGNOSIS — Z951 Presence of aortocoronary bypass graft: Secondary | ICD-10-CM

## 2020-03-09 DIAGNOSIS — F39 Unspecified mood [affective] disorder: Secondary | ICD-10-CM

## 2020-03-09 MED ORDER — LEVOFLOXACIN 500 MG PO TABS: 500.0000 mg | ORAL_TABLET | Freq: Every day | ORAL | 0 refills | Status: DC

## 2020-03-09 NOTE — Progress Notes (Signed)
Subjective:    Patient ID: Cody Arellano, male    DOB: Nov 21, 1945, 74 y.o.   MRN: [id number]  HPI Here due to multiple complaints This visit occurred during the SARS-CoV-2 public health emergency.  Safety protocols were in place, including screening questions prior to the visit, additional usage of staff PPE, and extensive cleaning of exam room while observing appropriate contact time as indicated for disinfecting solutions.   Was hospitalized for perirectal abscess Was drained Finished the antibiotics Drainage tubes removed by surgeon----doing better Was constipated--so put on miralax. Now with loose stools so held today  BP seems low Therapist concerned that something is wrong Has "low energy"---could sleep 20 hours a day Appetite is slightly better--regaining some of his weight  Now seeing Cody Arellano doctor for the lupus They feel this is controlled on same chronic meds  No chest pain Easy DOE if walking No palpitations Sleeps in bed flat---no PND   Current Outpatient Medications on File Prior to Visit  Medication Sig Dispense Refill  . amiodarone (PACERONE) 100 MG tablet Take 1 tablet (100 mg total) by mouth 2 (two) times daily. 60 tablet 1  . apixaban (ELIQUIS) 5 MG TABS tablet Take 1 tablet (5 mg total) by mouth 2 (two) times daily. 60 tablet 2  . aspirin EC 81 MG tablet Take 1 tablet (81 mg total) by mouth daily.    [name] azaTHIOprine (IMURAN) 50 MG tablet Take 100 mg by mouth 2 (two) times daily.   2  . ferrous sulfate 325 (65 FE) MG tablet Take 1 tablet (325 mg total) by mouth daily with breakfast. 30 tablet 3  . hydroxychloroquine (PLAQUENIL) 200 MG tablet Take 400 mg by mouth at bedtime.     [name] losartan (COZAAR) 25 MG tablet Take 0.5 tablets (12.5 mg total) by mouth daily. 30 tablet 1  . oxyCODONE (OXY IR/ROXICODONE) 5 MG immediate release tablet Take 1 tablet (5 mg total) by mouth every 6 (six) hours as needed for severe pain. 30 tablet 0  . polyethylene glycol (MIRALAX /  GLYCOLAX) 17 g packet Take 17 g by mouth daily.    . polyvinyl alcohol (ARTIFICIAL TEARS) 1.4 % ophthalmic solution Place 1 drop into both eyes daily.    . rosuvastatin (CRESTOR) 20 MG tablet Take 20 mg by mouth daily.    [name] spironolactone (ALDACTONE) 25 MG tablet Take 0.5 tablets (12.5 mg total) by mouth daily. 30 tablet 1  . tamsulosin (FLOMAX) 0.4 MG CAPS capsule Take 0.4 mg by mouth every evening.      No current facility-administered medications on file prior to visit.    No Known Allergies  Past Medical History:  Diagnosis Date  . Anxiety   . Chronic tension headache   . Colon polyps   . Coronary artery disease 2008/2009   MI with PCI x 2, then PCI x 1 in 2009  . Depression   . Dyslipidemia   . Dysrhythmia    atrial fibrillation  . Hyperlipidemia   . Hypertension   . Lupus [facility])    sees Dr Trudie Reed  . Lupus disease of the lung   . 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 Myocardial infarction ([place]) 2008  . 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 Shortness of breath     Past Surgical History:  Procedure Laterality Date  . CARDIAC CATHETERIZATION    . CLIPPING OF ATRIAL APPENDAGE N/A 10/17/2019  Procedure: Clipping Of Atrial Appendage using AtriCure [id number] 45 MM AtriClip.;  Surgeon: Rexene Alberts, MD;  Location: [place];  Service: Open Heart Surgery;  Laterality: N/A;  . CORONARY ARTERY BYPASS GRAFT N/A 10/17/2019   Procedure: CORONARY ARTERY BYPASS GRAFTING (CABG) using LIMA to LAD; Endoscopic harvest right greater saphenous vein: SVG to RAMUS.;  Surgeon: Rexene Alberts, MD;  Location: [place];  Service: Open Heart Surgery;  Laterality: N/A;  . CORONARY BALLOON ANGIOPLASTY N/A 10/17/2019   Procedure: CORONARY BALLOON ANGIOPLASTY;  Surgeon: Nelva Bush, MD;  Location: [place] CV LAB;  Service: Cardiovascular;  Laterality: N/A;  . coronary stents  2009  . CORONARY/GRAFT ACUTE MI REVASCULARIZATION N/A 10/17/2019    Procedure: Coronary/Graft Acute MI Revascularization;  Surgeon: Nelva Bush, MD;  Location: [place] CV LAB;  Service: Cardiovascular;  Laterality: N/A;  . ENDOVEIN HARVEST OF GREATER SAPHENOUS VEIN Right 10/17/2019   Procedure: [place] Of Greater Saphenous Vein;  Surgeon: Rexene Alberts, MD;  Location: [place];  Service: Open Heart Surgery;  Laterality: Right;  . IABP INSERTION N/A 10/17/2019   Procedure: IABP Insertion;  Surgeon: Nelva Bush, MD;  Location: [place] CV LAB;  Service: Cardiovascular;  Laterality: N/A;  . INCISION AND DRAINAGE ABSCESS N/A 01/29/2020   Procedure: INCISION AND DRAINAGE BILATERAL PERIRECTAL ABSCESS;  Surgeon: Stark Klein, MD;  Location: [place];  Service: General;  Laterality: N/A;  . IR THORACENTESIS ASP PLEURAL SPACE W/IMG GUIDE  10/26/2019  . RIGHT/LEFT HEART CATH AND CORONARY ANGIOGRAPHY N/A 10/17/2019   Procedure: RIGHT/LEFT HEART CATH AND CORONARY ANGIOGRAPHY;  Surgeon: Nelva Bush, MD;  Location: [place] CV LAB;  Service: Cardiovascular;  Laterality: N/A;  . TEE WITHOUT CARDIOVERSION  10/17/2019   Procedure: Transesophageal Echocardiogram (Tee);  Surgeon: Rexene Alberts, MD;  Location: [facility];  Service: Open Heart Surgery;;    Family History  Problem Relation Age of Onset  . Heart disease Mother   . Alcohol abuse Father   . Hyperlipidemia Sister   . Hypertension Sister   . Hyperlipidemia Brother   . Hypertension Brother   . Cancer Brother        ?lung cancer  . Stomach cancer Brother   . Diabetes Paternal Uncle   . Colon cancer Neg Hx   . Esophageal cancer Neg Hx   . Rectal cancer Neg Hx     Social History   Socioeconomic History  . Marital status: Married    Spouse name: Not on file  . Number of children: 5  . Years of education: Not on file  . Highest education level: Not on file  Occupational History  . Occupation: Engineer, production for [employer]    Comment: retired  Tobacco Use  . Smoking  status: Former Smoker    Packs/day: 1.00    Years: 20.00    Pack years: 20.00    Types: Cigarettes    Quit date: 02/26/1993    Years since quitting: 27.0  . Smokeless tobacco: Never Used  . Tobacco comment: QUIT SMOKING 20 YEARS AGO  Vaping Use  . Vaping Use: Never used  Substance and Sexual Activity  . Alcohol use: No  . Drug use: No  . Sexual activity: Not on file  Other Topics Concern  . Not on file  Social History Narrative   No living will   Wife should make health care decisions--alternate is sons   Would accept resuscitation   Not sure about tube feeds   Social  Determinants of Health   Financial Resource Strain:   . Difficulty of Paying Living Expenses: Not on file  Food Insecurity:   . Worried About Charity fundraiser in the Last Year: Not on file  . Ran Out of Food in the Last Year: Not on file  Transportation Needs:   . Lack of Transportation (Medical): Not on file  . Lack of Transportation (Non-Medical): Not on file  Physical Activity:   . Days of Exercise per Week: Not on file  . Minutes of Exercise per Session: Not on file  Stress:   . Feeling of Stress : Not on file  Social Connections:   . Frequency of Communication with Friends and Family: Not on file  . Frequency of Social Gatherings with Friends and Family: Not on file  . Attends Religious Services: Not on file  . Active Member of Clubs or Organizations: Not on file  . Attends Archivist Meetings: Not on file  . Marital Status: Not on file  Intimate Partner Violence:   . Fear of Current or Ex-Partner: Not on file  . Emotionally Abused: Not on file  . Physically Abused: Not on file  . Sexually Abused: Not on file   Review of Systems Gets a good night's sleep but has to be uup 3-4 times for voiding. Does get back to sleep Not depressed or anxious Lives with wife---son in and out Feet are numb    Objective:   Physical Exam Constitutional:      Comments: Clear wasting    Cardiovascular:     Rate and Rhythm: Normal rate and regular rhythm.     Heart sounds: No murmur heard.  No gallop.      Comments: ?slight systolic murmur at apex Pulmonary:     Effort: Pulmonary effort is normal.     Breath sounds: No wheezing.     Comments: Slightly decreased breath sounds Transient crackles left base Abdominal:     Palpations: Abdomen is soft.     Tenderness: There is no abdominal tenderness.  Musculoskeletal:     Cervical back: Neck supple.     Right lower leg: No edema.     Left lower leg: No edema.  Psychiatric:        Mood and Affect: Mood normal.            Assessment & Plan:

## 2020-03-09 NOTE — Assessment & Plan Note (Signed)
Lost a lot of weight with recent hospitalization Weight loss seems to have stabilized

## 2020-03-09 NOTE — Telephone Encounter (Signed)
I left a detailed message on patient's voice mail to return my call.  I blocked Dr.Letvak's 12:15 today for patient.

## 2020-03-09 NOTE — Assessment & Plan Note (Signed)
Has DOE that could be an anginal equivalent Last echo with MI showed EF 45-50% On losartan and spironolactone---but may be causing fatigue with his hypotension Will check CXR to be sure no fluid

## 2020-03-09 NOTE — Assessment & Plan Note (Signed)
Mood seems to be okay now I don't think this is causing his fatigue

## 2020-03-09 NOTE — Patient Instructions (Signed)
Please stop the losartan and spironolactone. Don't take the amiodarone for the week while you are taking the antibiotic---levofloxacin.

## 2020-03-09 NOTE — Assessment & Plan Note (Signed)
Actually has worsened since home Will check labs ?related to hypotension from meds?

## 2020-03-09 NOTE — Assessment & Plan Note (Signed)
CXR shows RLL infiltrate He has been coughing some Will go ahead and treat with levaquin for a week  Stop the losartan and aldactone as well Couldn't get labs

## 2020-03-09 NOTE — Telephone Encounter (Signed)
I can add him on at 12:45 today----that is all I have till I get back from vacation

## 2020-03-09 NOTE — Progress Notes (Signed)
Cardiac Individual Treatment Plan  Patient Details  Name: LEVELLE EDELEN MRN: [id number] Date of Birth: [birth year] Referring Provider:     Cardiac Rehab from 12/16/2019 in [facility] Cardiac and Pulmonary Rehab  Referring Provider Herold Harms, MD ([place])      Initial Encounter Date:    Cardiac Rehab from 12/16/2019 in [facility] Cardiac and Pulmonary Rehab  Date 12/16/19      Visit Diagnosis: S/P CABG x 2  Patient's Home Medications on Admission:  Current Outpatient Medications:  .  amiodarone (PACERONE) 100 MG tablet, Take 1 tablet (100 mg total) by mouth 2 (two) times daily., Disp: 60 tablet, Rfl: 1 .  apixaban (ELIQUIS) 5 MG TABS tablet, Take 1 tablet (5 mg total) by mouth 2 (two) times daily., Disp: 60 tablet, Rfl: 2 .  aspirin EC 81 MG tablet, Take 1 tablet (81 mg total) by mouth daily., Disp: , Rfl:  .  azaTHIOprine (IMURAN) 50 MG tablet, Take 100 mg by mouth 2 (two) times daily. , Disp: , Rfl: 2 .  ferrous sulfate 325 (65 FE) MG tablet, Take 1 tablet (325 mg total) by mouth daily with breakfast., Disp: 30 tablet, Rfl: 3 .  hydroxychloroquine (PLAQUENIL) 200 MG tablet, Take 400 mg by mouth at bedtime. , Disp: , Rfl:  .  losartan (COZAAR) 25 MG tablet, Take 0.5 tablets (12.5 mg total) by mouth daily., Disp: 30 tablet, Rfl: 1 .  oxyCODONE (OXY IR/ROXICODONE) 5 MG immediate release tablet, Take 1 tablet (5 mg total) by mouth every 6 (six) hours as needed for severe pain., Disp: 30 tablet, Rfl: 0 .  polyethylene glycol (MIRALAX / GLYCOLAX) 17 g packet, Take 17 g by mouth daily., Disp: , Rfl:  .  polyvinyl alcohol (ARTIFICIAL TEARS) 1.4 % ophthalmic solution, Place 1 drop into both eyes daily., Disp: , Rfl:  .  rosuvastatin (CRESTOR) 20 MG tablet, Take 20 mg by mouth daily., Disp: , Rfl:  .  spironolactone (ALDACTONE) 25 MG tablet, Take 0.5 tablets (12.5 mg total) by mouth daily., Disp: 30 tablet, Rfl: 1 .  tamsulosin (FLOMAX) 0.4 MG CAPS capsule, Take 0.4 mg by mouth every evening. , Disp: , Rfl:    Past Medical History: Past Medical History:  Diagnosis Date  . Anxiety   . Chronic tension headache   . Colon polyps   . Coronary artery disease 2008/2009   MI with PCI x 2, then PCI x 1 in 2009  . Depression   . Dyslipidemia   . Dysrhythmia    atrial fibrillation  . Hyperlipidemia   . Hypertension   . Lupus [facility])    sees Dr Trudie Reed  . Lupus disease of the lung   . 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 Myocardial infarction ([place]) 2008  . 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 Shortness of breath     Tobacco Use: Social History   Tobacco Use  Smoking Status Former Smoker  . Packs/day: 1.00  . Years: 20.00  . Pack years: 20.00  . Types: Cigarettes  . Quit date: 02/26/1993  . Years since quitting: 27.0  Smokeless Tobacco Never Used  Tobacco Comment   QUIT SMOKING 20 YEARS AGO    Labs: Recent Review Flowsheet Data    Labs for ITP Cardiac and Pulmonary Rehab Latest Ref Rng & Units 10/24/2019 10/25/2019 10/25/2019 10/26/2019 11/12/2019   Cholestrol 0 - 200 mg/dL - - - - -  LDLCALC 0 - 99 mg/dL - - - - -   HDL >40 mg/dL - - - - -   Trlycerides <150 mg/dL - - - - -   Hemoglobin A1c 4.8 - 5.6 % - - - - -   PHART 7.35 - 7.45 - - - - -   PCO2ART 32 - 48 mmHg - - - - -   HCO3 20.0 - 28.0 mmol/L - - - - -   TCO2 22 - 32 mmol/L - - - - 23   ACIDBASEDEF 0.0 - 2.0 mmol/L - - - - -   O2SAT % 53.3 46.8 55.3 59.4 -       Exercise Target Goals: Exercise Program Goal: Individual exercise prescription set using results from initial 6 min walk test and THRR while considering  patient's activity barriers and safety.   Exercise Prescription Goal: Initial exercise prescription builds to 30-45 minutes a day of aerobic activity, 2-3 days per week.  Home exercise guidelines will be given to patient during program as part of exercise prescription that the participant will acknowledge.   Education: Aerobic Exercise & Resistance  Training: - Gives group verbal and written instruction on the various components of exercise. Focuses on aerobic and resistive training programs and the benefits of this training and how to safely progress through these programs..   Cardiac Rehab from 01/20/2020 in [facility] Cardiac and Pulmonary Rehab  Date 01/20/20  Educator [facility]  Instruction Review Code 1- Verbalizes Understanding      Education: Exercise & Equipment Safety: - Individual verbal instruction and demonstration of equipment use and safety with use of the equipment.   Cardiac Rehab from 01/20/2020 in [facility] Cardiac and Pulmonary Rehab  Date 12/16/19  Educator [place]  Instruction Review Code 1- Verbalizes Understanding      Education: Exercise Physiology & General Exercise Guidelines: - Group verbal and written instruction with models to review the exercise physiology of the cardiovascular system and associated critical values. Provides general exercise guidelines with specific guidelines to those with heart or lung disease.    Education: Flexibility, Balance, Mind/Body Relaxation: Provides group verbal/written instruction on the benefits of flexibility and balance training, including mind/body exercise modes such as yoga, pilates and tai chi.  Demonstration and skill practice provided.   Activity Barriers & Risk Stratification:  Activity Barriers & Cardiac Risk Stratification - 12/11/19 1343      Activity Barriers & Cardiac Risk Stratification   Activity Barriers Balance Concerns;History of Falls;Muscular Weakness;Other (comment)    Comments lupus- toes and ankle numb; trembling (started within the last month)    Cardiac Risk Stratification High           6 Minute Walk:  6 Minute Walk    Row Name 12/16/19 1308         6 Minute Walk   Phase Initial     Distance 1100 feet     Walk Time 6 minutes     # of Rest Breaks 0     MPH 2.08     METS 2.37     RPE 11     Perceived Dyspnea  1     VO2 Peak 8.32     Symptoms No      Resting HR 60 bpm     Resting BP 90/52     Resting Oxygen Saturation  95 %     Exercise Oxygen Saturation  during 6 min walk 98 %     Max Ex. HR 69  bpm     Max Ex. BP 110/58     2 Minute Post BP 96/60            Oxygen Initial Assessment:   Oxygen Re-Evaluation:   Oxygen Discharge (Final Oxygen Re-Evaluation):   Initial Exercise Prescription:  Initial Exercise Prescription - 12/17/19 0700      Date of Initial Exercise RX and Referring Provider   Date 12/16/19    Referring Provider Herold Harms, MD ([place])      Treadmill   MPH 1.5    Grade 0    Minutes 15    METs 2.15      Recumbant Bike   Level 1    RPM 60    Watts 10    Minutes 15    METs 2.3      NuStep   Level 1    SPM 80    Minutes 15    METs 2.3      REL-XR   Level 1    Speed 50    Minutes 15    METs 2.3      T5 Nustep   Level 1    SPM 80    Minutes 15    METs 2.3      Prescription Details   Frequency (times per week) 3      Intensity   THRR 40-80% of Max Heartrate 94-128    Ratings of Perceived Exertion 11-13    Perceived Dyspnea 0-4      Progression   Progression Continue to progress workloads to maintain intensity without signs/symptoms of physical distress.      Resistance Training   Training Prescription Yes    Weight 3 lb    Reps 10-15           Perform Capillary Blood Glucose checks as needed.  Exercise Prescription Changes:  Exercise Prescription Changes    Row Name 12/16/19 1300 12/16/19 1500 12/22/19 1200 01/18/20 1700 02/01/20 1500     Response to Exercise   Blood Pressure (Admit) 90/52 90/52 110/58 98/60 90/42    Blood Pressure (Exercise) 110/58 110/58 106/56 108/58 124/70   Blood Pressure (Exit) 96/60 96/60 102/62 106/68 90/42   Heart Rate (Admit) 60 bpm 60 bpm 76 bpm 71 bpm 73 bpm   Heart Rate (Exercise) 69 bpm 69 bpm 94 bpm 84 bpm 91 bpm   Heart Rate (Exit) 64 bpm 64 bpm 63 bpm 68 bpm 72 bpm   Oxygen Saturation (Admit) 95 % 95 % -- -- --   Oxygen  Saturation (Exercise) 98 % 98 % -- -- --   Oxygen Saturation (Exit) 97 % 97 % -- -- --   Rating of Perceived Exertion (Exercise) 11 11 12 13 12    Perceived Dyspnea (Exercise) 1 1 -- -- --   Symptoms none none none none none   Comments Walk Test Results Walk Test Results -- -- --   Duration -- -- Progress to 30 minutes of  aerobic without signs/symptoms of physical distress Continue with 30 min of aerobic exercise without signs/symptoms of physical distress. Continue with 30 min of aerobic exercise without signs/symptoms of physical distress.   Intensity -- -- THRR unchanged THRR unchanged THRR unchanged     Progression   Progression -- -- Continue to progress workloads to maintain intensity without signs/symptoms of physical distress. Continue to progress workloads to maintain intensity without signs/symptoms of physical distress. Continue to progress workloads to maintain intensity without signs/symptoms of physical distress.  Average METs -- -- 2.66 2.4 2.07     Resistance Training   Training Prescription -- -- Yes Yes Yes   Weight 3 lb 3 lb 3 lb 3 lb 3 lb   Reps 10-15 10-15 10-15 10-15 10-15     Interval Training   Interval Training No No No No No     Treadmill   MPH 1.5 1.5 -- -- 2.1   Grade 0 0 -- -- 0   Minutes 15 15 -- -- 15   METs 2.15 2.15 -- -- 2.61     Recumbant Bike   Level $Remo'1 1 1 3 3   'KcwKq$ RPM 60 60 60 60 --   Watts 10 10 -- -- 12   Minutes $Remove'15 15 15 15 15   'sTdjVPK$ METs 2.3 2.3 3.12 3.12 2.67     NuStep   Level $Remo'1 1 1 4 4   'zXVmP$ SPM 80 80 80 80 --   Minutes $Remove'15 15 15 15 15   'SnwXWfO$ METs 2.3 2.3 2.2 1.7 1.5     REL-XR   Level 1 1 -- -- --   Speed -- 50 -- -- --   Minutes 15 15 -- -- --   METs 2.3 2.3 -- -- --     T5 Nustep   Level 1 1 -- -- 3   SPM 80 80 -- -- --   Minutes 15 15 -- -- 15   METs 2.3 2.3 -- -- 1.7          Exercise Comments:   Exercise Goals and Review:  Exercise Goals    Row Name 12/16/19 1321             Exercise Goals   Increase Physical  Activity Yes       Intervention Provide advice, education, support and counseling about physical activity/exercise needs.;Develop an individualized exercise prescription for aerobic and resistive training based on initial evaluation findings, risk stratification, comorbidities and participant's personal goals.       Expected Outcomes Short Term: Attend rehab on a regular basis to increase amount of physical activity.;Long Term: Add in home exercise to make exercise part of routine and to increase amount of physical activity.;Long Term: Exercising regularly at least 3-5 days a week.       Increase Strength and Stamina Yes       Intervention Provide advice, education, support and counseling about physical activity/exercise needs.;Develop an individualized exercise prescription for aerobic and resistive training based on initial evaluation findings, risk stratification, comorbidities and participant's personal goals.       Expected Outcomes Short Term: Increase workloads from initial exercise prescription for resistance, speed, and METs.;Long Term: Improve cardiorespiratory fitness, muscular endurance and strength as measured by increased METs and functional capacity (6MWT);Short Term: Perform resistance training exercises routinely during rehab and add in resistance training at home       Able to understand and use rate of perceived exertion (RPE) scale Yes       Intervention Provide education and explanation on how to use RPE scale       Expected Outcomes Short Term: Able to use RPE daily in rehab to express subjective intensity level;Long Term:  Able to use RPE to guide intensity level when exercising independently       Able to understand and use Dyspnea scale Yes       Intervention Provide education and explanation on how to use Dyspnea scale       Expected Outcomes Short  Term: Able to use Dyspnea scale daily in rehab to express subjective sense of shortness of breath during exertion;Long Term: Able to  use Dyspnea scale to guide intensity level when exercising independently       Knowledge and understanding of Target Heart Rate Range (THRR) Yes       Intervention Provide education and explanation of THRR including how the numbers were predicted and where they are located for reference       Expected Outcomes Short Term: Able to state/look up THRR;Short Term: Able to use daily as guideline for intensity in rehab;Long Term: Able to use THRR to govern intensity when exercising independently       Able to check pulse independently Yes       Intervention Provide education and demonstration on how to check pulse in carotid and radial arteries.;Review the importance of being able to check your own pulse for safety during independent exercise       Expected Outcomes Short Term: Able to explain why pulse checking is important during independent exercise;Long Term: Able to check pulse independently and accurately       Understanding of Exercise Prescription Yes       Intervention Provide education, explanation, and written materials on patient's individual exercise prescription       Expected Outcomes Short Term: Able to explain program exercise prescription;Long Term: Able to explain home exercise prescription to exercise independently              Exercise Goals Re-Evaluation :  Exercise Goals Re-Evaluation    Row Name 12/22/19 1248 01/04/20 1116 01/18/20 1730 02/01/20 1521       Exercise Goal Re-Evaluation   Exercise Goals Review Increase Physical Activity;Increase Strength and Stamina;Understanding of Exercise Prescription Increase Physical Activity;Increase Strength and Stamina;Understanding of Exercise Prescription Increase Physical Activity;Increase Strength and Stamina;Understanding of Exercise Prescription Increase Physical Activity;Increase Strength and Stamina;Understanding of Exercise Prescription    Comments First full day of exercise!  Patient was oriented to gym and equipment including  functions, settings, policies, and procedures.  Patient's individual exercise prescription and treatment plan were reviewed.  All starting workloads were established based on the results of the 6 minute walk test done at initial orientation visit.  The plan for exercise progression was also introduced and progression will be customized based on patient's performance and goals. Kasper has been coming to rehab, but has been missing some sessions. He feels better and is ready to exercise again. Alazar does not currently do exercise at home but staff will review home exercise instructions when ready. Chanoch is up to level 4 on NS.  he works at [employer].  Staff will monitor progress. Currently in hospital for sepsis.  He will need clearance to return.    Expected Outcomes Short: Use RPE daily to regulate intensity. Long: Follow program prescription in THR. Short: Review home exercise Long: Able to exercise indepedently at home following THR Short:  continue to increase levels on machines Long:  improve overal stamina and MET level Short: Clearance to return to rehab Long: Continue to improve stamina.           Discharge Exercise Prescription (Final Exercise Prescription Changes):  Exercise Prescription Changes - 02/01/20 1500      Response to Exercise   Blood Pressure (Admit) 90/42    Blood Pressure (Exercise) 124/70    Blood Pressure (Exit) 90/42    Heart Rate (Admit) 73 bpm    Heart Rate (Exercise) 91 bpm  Heart Rate (Exit) 72 bpm    Rating of Perceived Exertion (Exercise) 12    Symptoms none    Duration Continue with 30 min of aerobic exercise without signs/symptoms of physical distress.    Intensity THRR unchanged      Progression   Progression Continue to progress workloads to maintain intensity without signs/symptoms of physical distress.    Average METs 2.07      Resistance Training   Training Prescription Yes    Weight 3 lb    Reps 10-15      Interval Training   Interval Training  No      Treadmill   MPH 2.1    Grade 0    Minutes 15    METs 2.61      Recumbant Bike   Level 3    Watts 12    Minutes 15    METs 2.67      NuStep   Level 4    Minutes 15    METs 1.5      T5 Nustep   Level 3    Minutes 15    METs 1.7           Nutrition:  Target Goals: Understanding of nutrition guidelines, daily intake of sodium '1500mg'$ , cholesterol '200mg'$ , calories 30% from fat and 7% or less from saturated fats, daily to have 5 or more servings of fruits and vegetables.  Education: Controlling Sodium/Reading Food Labels -Group verbal and written material supporting the discussion of sodium use in heart healthy nutrition. Review and explanation with models, verbal and written materials for utilization of the food label.   Education: General Nutrition Guidelines/Fats and Fiber: -Group instruction provided by verbal, written material, models and posters to present the general guidelines for heart healthy nutrition. Gives an explanation and review of dietary fats and fiber.   Biometrics:  Pre Biometrics - 12/16/19 1310      Pre Biometrics   Height $Remov'5\' 11"'vjrPQf$  (1.803 m)    Weight 154 lb 1.6 oz (69.9 kg)    BMI (Calculated) 21.5    Single Leg Stand 14.28 seconds            Nutrition Therapy Plan and Nutrition Goals:  Nutrition Therapy & Goals - 01/18/20 1431      Nutrition Therapy   Diet Low Na, heart healthy, high kcal, high protein    Protein (specify units) 60-65g    Fiber 30 grams    Whole Grain Foods 3 servings    Saturated Fats 12 max. grams    Fruits and Vegetables 5 servings/day    Sodium 1.5 grams      Personal Nutrition Goals   Nutrition Goal ST: ensure BID, add heart healthy fats like soft margerine to popcorn, add small frequent meals LT: improve sleep, feel better during the day, golf and go out to have dinner with his wife and play with his grandkids    Comments Pt primary diagnosis for Cardiac Rehab is S/P CABG x2. Pt also presents with CAD,  a-fib, STEMI, lupus pericarditis, interstitial lung disease, HLD, memory loss. Per recent office visit with the cardiothorasic doctor he continues to lose weight due to low intake <1200kcal/day and he is extrememly fatigued. Current weight 69.9kg. 73.5kg 11/16/19 - now 69.9.Current Relevant Medications: cozaar, zoloft, oxycodone, crestor. Pt reports going downhill after his surgery. Plans to go to [place] in November to see his new grandchild. Pt reports his appetite is low unless he loves the food. B: three sausage,  waffles, eggs and coffee and orange juice --> two links, egg, and waffle. May not eat until 4pm now - if it weren't for the medicine he wouldn't eat anything at night. Pt reports having ensure to help. D: cabbage and smoked sausage and cornbread and broccoli or spaghetti or baked chicken and mashed potatoes. He reports not consistently finishing his meals at night. Pt would like to take ensure BID. Discussed eart healthy eating and some high calorie, high protein MNT tactics so that he will not continue to lose weight and meet his needs.      Intervention Plan   Intervention Prescribe, educate and counsel regarding individualized specific dietary modifications aiming towards targeted core components such as weight, hypertension, lipid management, diabetes, heart failure and other comorbidities.;Nutrition handout(s) given to patient.    Expected Outcomes Short Term Goal: Understand basic principles of dietary content, such as calories, fat, sodium, cholesterol and nutrients.;Short Term Goal: A plan has been developed with personal nutrition goals set during dietitian appointment.;Long Term Goal: Adherence to prescribed nutrition plan.           Nutrition Assessments:  Nutrition Assessments - 12/16/19 1527      MEDFICTS Scores   Pre Score 25           MEDIFICTS Score Key:          ?70 Need to make dietary changes          40-70 Heart Healthy Diet         ? 40 Therapeutic Level  Cholesterol Diet  Nutrition Goals Re-Evaluation:   Nutrition Goals Discharge (Final Nutrition Goals Re-Evaluation):   Psychosocial: Target Goals: Acknowledge presence or absence of significant depression and/or stress, maximize coping skills, provide positive support system. Participant is able to verbalize types and ability to use techniques and skills needed for reducing stress and depression.   Education: Depression - Provides group verbal and written instruction on the correlation between heart/lung disease and depressed mood, treatment options, and the stigmas associated with seeking treatment.   Cardiac Rehab from 01/20/2020 in [facility] Cardiac and Pulmonary Rehab  Date 01/06/20  Educator [facility]  Instruction Review Code 1- [employer] Understanding      Education: Sleep Hygiene -Provides group verbal and written instruction about how sleep can affect your health.  Define sleep hygiene, discuss sleep cycles and impact of sleep habits. Review good sleep hygiene tips.     Education: Stress and Anxiety: - Provides group verbal and written instruction about the health risks of elevated stress and causes of high stress.  Discuss the correlation between heart/lung disease and anxiety and treatment options. Review healthy ways to manage with stress and anxiety.   Cardiac Rehab from 01/20/2020 in [facility] Cardiac and Pulmonary Rehab  Date 01/06/20  Educator [facility]  Instruction Review Code 1- Verbalizes Understanding       Initial Review & Psychosocial Screening:  Initial Psych Review & Screening - 12/11/19 1338      Initial Review   Current issues with Current Stress Concerns    Source of Stress Concerns Chronic Illness      Family Dynamics   Good Support System? Yes   wife, sons, grandkids     Barriers   Psychosocial barriers to participate in program There are no identifiable barriers or psychosocial needs.      Screening Interventions   Interventions Encouraged to exercise;To provide  support and resources with identified psychosocial needs;Provide feedback about the scores to participant    Expected Outcomes  Short Term goal: Utilizing psychosocial counselor, staff and physician to assist with identification of specific Stressors or current issues interfering with healing process. Setting desired goal for each stressor or current issue identified.;Long Term Goal: Stressors or current issues are controlled or eliminated.;Short Term goal: Identification and review with participant of any Quality of Life or Depression concerns found by scoring the questionnaire.;Long Term goal: The participant improves quality of Life and PHQ9 Scores as seen by post scores and/or verbalization of changes           Quality of Life Scores:   Quality of Life - 12/16/19 1526      Quality of Life   Select Quality of Life      Quality of Life Scores   Health/Function Pre 14.9 %    Socioeconomic Pre 22 %    Psych/Spiritual Pre 23.93 %    Family Pre 24 %    GLOBAL Pre 19.56 %          Scores of 19 and below usually indicate a poorer quality of life in these areas.  A difference of  2-3 points is a clinically meaningful difference.  A difference of 2-3 points in the total score of the Quality of Life Index has been associated with significant improvement in overall quality of life, self-image, physical symptoms, and general health in studies assessing change in quality of life.  PHQ-9: Recent Review Flowsheet Data    Depression screen [facility] 2/9 01/13/2020 12/16/2019 01/20/2019 01/09/2018   Decreased Interest 0 0 0 0   Down, Depressed, Hopeless 1 1 1  0   PHQ - 2 Score 1 1 1  0   Altered sleeping 2 3 - -   Tired, decreased energy 2 3 - -   Change in appetite 1 1 - -   Feeling bad or failure about yourself  0 2 - -   Trouble concentrating 0 1 - -   Moving slowly or fidgety/restless 0 0 - -   Suicidal thoughts 0 0 - -   PHQ-9 Score 6 11 - -   Difficult doing work/chores Somewhat difficult Somewhat  difficult - -     Interpretation of Total Score  Total Score Depression Severity:  1-4 = Minimal depression, 5-9 = Mild depression, 10-14 = Moderate depression, 15-19 = Moderately severe depression, 20-27 = Severe depression   Psychosocial Evaluation and Intervention:  Psychosocial Evaluation - 01/22/20 1130      Psychosocial Evaluation & Interventions   Interventions Encouraged to exercise with the program and follow exercise prescription    Comments Patient reports no issues with their current mental states, sleep, stress, depression or anxiety. Will follow up with patient in a few weeks for any changes.    Expected Outcomes Short: Continue to exercise regularly to support mental health and notify staff of any changes. Long: maintain mental health and well being through teaching of rehab or prescribed medications independently.    Continue Psychosocial Services  Follow up required by staff           Psychosocial Re-Evaluation:  Psychosocial Re-Evaluation    [place] Name 01/04/20 1134 01/13/20 1138           Psychosocial Re-Evaluation   Current issues with -- Current Stress Concerns      Comments Rykin struggles staying asleep, but also has been sick so he thinks that was has been contributing to it. Overall, he reports feeling well mentally. He states that he is getting around better. Patient's PHQ9  improved and he states its from his family being more supportive. He feels liberated that he has the support and he continues to enjoy exercising      Expected Outcomes Short: Utilize exercise to help maintain better sleep patterns Long: Maintain positive attitude Short: continue to come to cardiac rehab for exercise. Long: develop positive self care habits      Continue Psychosocial Services  Follow up required by staff Follow up required by staff             Psychosocial Discharge (Final Psychosocial Re-Evaluation):  Psychosocial Re-Evaluation - 01/13/20 1138      Psychosocial  Re-Evaluation   Current issues with Current Stress Concerns    Comments Patient's PHQ9 improved and he states its from his family being more supportive. He feels liberated that he has the support and he continues to enjoy exercising    Expected Outcomes Short: continue to come to cardiac rehab for exercise. Long: develop positive self care habits    Continue Psychosocial Services  Follow up required by staff           Vocational Rehabilitation: Provide vocational rehab assistance to qualifying candidates.   Vocational Rehab Evaluation & Intervention:  Vocational Rehab - 12/11/19 1338      Initial Vocational Rehab Evaluation & Intervention   Assessment shows need for Vocational Rehabilitation No           Education: Education Goals: Education classes will be provided on a variety of topics geared toward better understanding of heart health and risk factor modification. Participant will state understanding/return demonstration of topics presented as noted by education test scores.  Learning Barriers/Preferences:  Learning Barriers/Preferences - 12/11/19 1338      Learning Barriers/Preferences   Learning Barriers None    Learning Preferences None           General Cardiac Education Topics:  AED/CPR: - Group verbal and written instruction with the use of models to demonstrate the basic use of the AED with the basic ABC's of resuscitation.   Anatomy & Physiology of the Heart: - Group verbal and written instruction and models provide basic cardiac anatomy and physiology, with the coronary electrical and arterial systems. Review of Valvular disease and Heart Failure   Cardiac Procedures: - Group verbal and written instruction to review commonly prescribed medications for heart disease. Reviews the medication, class of the drug, and side effects. Includes the steps to properly store meds and maintain the prescription regimen. (beta blockers and nitrates)   Cardiac  Medications I: - Group verbal and written instruction to review commonly prescribed medications for heart disease. Reviews the medication, class of the drug, and side effects. Includes the steps to properly store meds and maintain the prescription regimen.   Cardiac Medications II: -Group verbal and written instruction to review commonly prescribed medications for heart disease. Reviews the medication, class of the drug, and side effects. (all other drug classes)   Cardiac Rehab from 01/20/2020 in [facility] Cardiac and Pulmonary Rehab  Date 01/13/20  Educator [facility]  Instruction Review Code 1- Verbalizes Understanding       Go Sex-Intimacy & Heart Disease, Get SMART - Goal Setting: - Group verbal and written instruction through game format to discuss heart disease and the return to sexual intimacy. Provides group verbal and written material to discuss and apply goal setting through the application of the S.M.A.R.T. Method.   Other Matters of the Heart: - Provides group verbal, written materials and models to describe Stable  Angina and Peripheral Artery. Includes description of the disease process and treatment options available to the cardiac patient.   Infection Prevention: - Provides verbal and written material to individual with discussion of infection control including proper hand washing and proper equipment cleaning during exercise session.   Cardiac Rehab from 01/20/2020 in [facility] Cardiac and Pulmonary Rehab  Date 12/16/19  Educator [place]  Instruction Review Code 1- Verbalizes Understanding      Falls Prevention: - Provides verbal and written material to individual with discussion of falls prevention and safety.   Cardiac Rehab from 01/20/2020 in [facility] Cardiac and Pulmonary Rehab  Date 12/16/19  Educator [place]  Instruction Review Code 1- Verbalizes Understanding      Other: -Provides group and verbal instruction on various topics (see comments)   Knowledge Questionnaire Score:   Knowledge Questionnaire Score - 12/16/19 1325      Knowledge Questionnaire Score   Pre Score 21/26: Heart Failure, Nutrition, Exercise           Core Components/Risk Factors/Patient Goals at Admission:  Personal Goals and Risk Factors at Admission - 12/16/19 1322      Core Components/Risk Factors/Patient Goals on Admission    Weight Management Weight Maintenance;Yes    Intervention Weight Management: Develop a combined nutrition and exercise program designed to reach desired caloric intake, while maintaining appropriate intake of nutrient and fiber, sodium and fats, and appropriate energy expenditure required for the weight goal.;Weight Management: Provide education and appropriate resources to help participant work on and attain dietary goals.;Weight Management/Obesity: Establish reasonable short term and long term weight goals.    Admit Weight 154 lb 1.6 oz (69.9 kg)    Goal Weight: Short Term 154 lb 1.6 oz (69.9 kg)    Goal Weight: Long Term 154 lb 1.6 oz (69.9 kg)    Expected Outcomes Short Term: Continue to assess and modify interventions until short term weight is achieved;Long Term: Adherence to nutrition and physical activity/exercise program aimed toward attainment of established weight goal;Weight Maintenance: Understanding of the daily nutrition guidelines, which includes 25-35% calories from fat, 7% or less cal from saturated fats, less than $RemoveB'200mg'odIrLxui$  cholesterol, less than 1.5gm of sodium, & 5 or more servings of fruits and vegetables daily;Understanding recommendations for meals to include 15-35% energy as protein, 25-35% energy from fat, 35-60% energy from carbohydrates, less than $RemoveB'200mg'LSftPkKu$  of dietary cholesterol, 20-35 gm of total fiber daily;Understanding of distribution of calorie intake throughout the day with the consumption of 4-5 meals/snacks    Hypertension Yes    Intervention Provide education on lifestyle modifcations including regular physical activity/exercise, weight  management, moderate sodium restriction and increased consumption of fresh fruit, vegetables, and low fat dairy, alcohol moderation, and smoking cessation.;Monitor prescription use compliance.    Expected Outcomes Short Term: Continued assessment and intervention until BP is < 140/63mm HG in hypertensive participants. < 130/37mm HG in hypertensive participants with diabetes, heart failure or chronic kidney disease.;Long Term: Maintenance of blood pressure at goal levels.    Lipids Yes    Intervention Provide education and support for participant on nutrition & aerobic/resistive exercise along with prescribed medications to achieve LDL '70mg'$ , HDL >$Remo'40mg'XOuze$ .    Expected Outcomes Short Term: Participant states understanding of desired cholesterol values and is compliant with medications prescribed. Participant is following exercise prescription and nutrition guidelines.;Long Term: Cholesterol controlled with medications as prescribed, with individualized exercise RX and with personalized nutrition plan. Value goals: LDL < $Rem'70mg'QbTG$ , HDL > 40 mg.  Education:Diabetes - Individual verbal and written instruction to review signs/symptoms of diabetes, desired ranges of glucose level fasting, after meals and with exercise. Acknowledge that pre and post exercise glucose checks will be done for 3 sessions at entry of program.   Education: Know Your Numbers and Risk Factors: -Group verbal and written instruction about important numbers in your health.  Discussion of what are risk factors and how they play a role in the disease process.  Review of Cholesterol, Blood Pressure, Diabetes, and BMI and the role they play in your overall health.   Cardiac Rehab from 01/20/2020 in [facility] Cardiac and Pulmonary Rehab  Date 01/13/20  Educator [facility]  Instruction Review Code 1- Verbalizes Understanding      Core Components/Risk Factors/Patient Goals Review:   Goals and Risk Factor Review    Row Name 01/04/20 1121 01/22/20  1128           Core Components/Risk Factors/Patient Goals Review   Personal Goals Review Weight Management/Obesity;Hypertension;Lipids Weight Management/Obesity;Hypertension;Lipids      Review Fenton has been doing well, weight has been maintained. BPs at rehab have been stable. He reports he has a BP monitor at home but is unsure if it works. Encouraged to bring it to rehab to check function. Bruno states he will buy a new one if it continues to not work. Him and his wife cook home a lot and has been eating fairly healthy. [place] would like to gain some weight. He has spoke to the dietician about his diet and is ready some changes. He wants to be around 165 pounds for his weight. He is currently at 150 pounds. He has lost 4 pounds sice he has started the program. His blood pressure has been stable and has not been hypotensive.      Expected Outcomes Short: Check BP at home, obtain new monitor if it continues to not work Long: Continue to manage lifestyle risk factors Short: use diet changes to gain some weight. Long: Gain weight of 165 pounds.             Core Components/Risk Factors/Patient Goals at Discharge (Final Review):   Goals and Risk Factor Review - 01/22/20 1128      Core Components/Risk Factors/Patient Goals Review   Personal Goals Review Weight Management/Obesity;Hypertension;Lipids    Review Elmar would like to gain some weight. He has spoke to the dietician about his diet and is ready some changes. He wants to be around 165 pounds for his weight. He is currently at 150 pounds. He has lost 4 pounds sice he has started the program. His blood pressure has been stable and has not been hypotensive.    Expected Outcomes Short: use diet changes to gain some weight. Long: Gain weight of 165 pounds.           ITP Comments:  ITP Comments    Row Name 12/11/19 1354 12/16/19 1307 01/13/20 0728 01/18/20 1726 02/01/20 1520   ITP Comments Initial orientation completed. Diagnosis can be  found in [facility] 5/15. EP orientation scheduled for Wednesday, 7/14 at 11 am. Completed 6MWT and gym orientation. Initial ITP created and sent for review to Dr. Emily Filbert, Medical Director. 30 Day review completed. Medical Director ITP review done, changes made as directed, and signed approval by Medical Director. Completed initial RD evaluation Currently hospitalized for sepsis.   [place] Name 02/03/20 1135 02/10/20 1638 03/02/20 1524 03/09/20 0631     ITP Comments Dellis Filbert called to let us know he just  got home from the hospital and is not sure how long before he can return to the program.  We will check with him at end of next week to see how he is healing. 30 day review completed. ITP sent to Dr. Emily Filbert, Medical Director of Cardiac and Pulmonary Rehab. Continue with ITP unless changes are made by physician. Called and spoke with patient. He is still recovering from surgery and is unsure when he is able to come back to [facility]. Patient states doctor is restricting lifting and driving at this time. Informed patient we will wait a couple weeks, re-assess at that time. Will talk about early discharge if needed. 30 Day review completed. Medical Director ITP review done, changes made as directed, and signed approval by Medical Director.           Comments:

## 2020-03-09 NOTE — Assessment & Plan Note (Signed)
Heart is regular now with skips I don't think this is causing his symptoms Is on the eliquis

## 2020-03-14 ENCOUNTER — Encounter: Payer: Federal, State, Local not specified - PPO | Attending: Internal Medicine

## 2020-03-14 DIAGNOSIS — Z951 Presence of aortocoronary bypass graft: Secondary | ICD-10-CM | POA: Insufficient documentation

## 2020-03-15 ENCOUNTER — Encounter: Payer: Self-pay | Admitting: *Deleted

## 2020-03-15 ENCOUNTER — Telehealth: Payer: Self-pay | Admitting: *Deleted

## 2020-03-15 DIAGNOSIS — Z951 Presence of aortocoronary bypass graft: Secondary | ICD-10-CM

## 2020-03-15 NOTE — Telephone Encounter (Signed)
Called to check on patient.  He is now recovering from pneumonia too.  He would like to discharge at this time.

## 2020-03-15 NOTE — Progress Notes (Signed)
Cardiac Individual Treatment Plan  Patient Details  Name: Cody Arellano MRN: [id number] Date of Birth: [birth year] Referring Provider:     Cardiac Rehab from 12/16/2019 in [facility] Cardiac and Pulmonary Rehab  Referring Provider Herold Harms, MD ([place])      Initial Encounter Date:    Cardiac Rehab from 12/16/2019 in [facility] Cardiac and Pulmonary Rehab  Date 12/16/19      Visit Diagnosis: S/P CABG x 2  Patient's Home Medications on Admission:  Current Outpatient Medications:  .  amiodarone (PACERONE) 100 MG tablet, Take 1 tablet (100 mg total) by mouth 2 (two) times daily., Disp: 60 tablet, Rfl: 1 .  apixaban (ELIQUIS) 5 MG TABS tablet, Take 1 tablet (5 mg total) by mouth 2 (two) times daily., Disp: 60 tablet, Rfl: 2 .  aspirin EC 81 MG tablet, Take 1 tablet (81 mg total) by mouth daily., Disp: , Rfl:  .  azaTHIOprine (IMURAN) 50 MG tablet, Take 100 mg by mouth 2 (two) times daily. , Disp: , Rfl: 2 .  ferrous sulfate 325 (65 FE) MG tablet, Take 1 tablet (325 mg total) by mouth daily with breakfast., Disp: 30 tablet, Rfl: 3 .  hydroxychloroquine (PLAQUENIL) 200 MG tablet, Take 400 mg by mouth at bedtime. , Disp: , Rfl:  .  levofloxacin (LEVAQUIN) 500 MG tablet, Take 1 tablet (500 mg total) by mouth daily., Disp: 7 tablet, Rfl: 0 .  oxyCODONE (OXY IR/ROXICODONE) 5 MG immediate release tablet, Take 1 tablet (5 mg total) by mouth every 6 (six) hours as needed for severe pain., Disp: 30 tablet, Rfl: 0 .  polyethylene glycol (MIRALAX / GLYCOLAX) 17 g packet, Take 17 g by mouth daily., Disp: , Rfl:  .  polyvinyl alcohol (ARTIFICIAL TEARS) 1.4 % ophthalmic solution, Place 1 drop into both eyes daily., Disp: , Rfl:  .  rosuvastatin (CRESTOR) 20 MG tablet, Take 20 mg by mouth daily., Disp: , Rfl:  .  tamsulosin (FLOMAX) 0.4 MG CAPS capsule, Take 0.4 mg by mouth every evening. , Disp: , Rfl:   Past Medical History: Past Medical History:  Diagnosis Date  . Anxiety   . Chronic tension headache   .  Colon polyps   . Coronary artery disease 2008/2009   MI with PCI x 2, then PCI x 1 in 2009  . Depression   . Dyslipidemia   . Dysrhythmia    atrial fibrillation  . Hyperlipidemia   . Hypertension   . Lupus [facility])    sees Dr Trudie Reed  . Lupus disease of the lung   . 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 Myocardial infarction ([place]) 2008  . 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 Shortness of breath     Tobacco Use: Social History   Tobacco Use  Smoking Status Former Smoker  . Packs/day: 1.00  . Years: 20.00  . Pack years: 20.00  . Types: Cigarettes  . Quit date: 02/26/1993  . Years since quitting: 27.0  Smokeless Tobacco Never Used  Tobacco Comment   QUIT SMOKING 20 YEARS AGO    Labs: Recent Review Flowsheet Data    Labs for ITP Cardiac and Pulmonary Rehab Latest Ref Rng & Units 10/24/2019 10/25/2019 10/25/2019 10/26/2019 11/12/2019   Cholestrol 0 - 200 mg/dL - - - - -   LDLCALC 0 - 99 mg/dL - - - - -   HDL >40 mg/dL - - - - -  Trlycerides <150 mg/dL - - - - -   Hemoglobin A1c 4.8 - 5.6 % - - - - -   PHART 7.35 - 7.45 - - - - -   PCO2ART 32 - 48 mmHg - - - - -   HCO3 20.0 - 28.0 mmol/L - - - - -   TCO2 22 - 32 mmol/L - - - - 23   ACIDBASEDEF 0.0 - 2.0 mmol/L - - - - -   O2SAT % 53.3 46.8 55.3 59.4 -       Exercise Target Goals: Exercise Program Goal: Individual exercise prescription set using results from initial 6 min walk test and THRR while considering  patient's activity barriers and safety.   Exercise Prescription Goal: Initial exercise prescription builds to 30-45 minutes a day of aerobic activity, 2-3 days per week.  Home exercise guidelines will be given to patient during program as part of exercise prescription that the participant will acknowledge.   Education: Aerobic Exercise & Resistance Training: - Gives group verbal and written instruction on the various components of exercise. Focuses on aerobic and  resistive training programs and the benefits of this training and how to safely progress through these programs..   Cardiac Rehab from 01/20/2020 in [facility] Cardiac and Pulmonary Rehab  Date 01/20/20  Educator [facility]  Instruction Review Code 1- Verbalizes Understanding      Education: Exercise & Equipment Safety: - Individual verbal instruction and demonstration of equipment use and safety with use of the equipment.   Cardiac Rehab from 01/20/2020 in [facility] Cardiac and Pulmonary Rehab  Date 12/16/19  Educator [place]  Instruction Review Code 1- Verbalizes Understanding      Education: Exercise Physiology & General Exercise Guidelines: - Group verbal and written instruction with models to review the exercise physiology of the cardiovascular system and associated critical values. Provides general exercise guidelines with specific guidelines to those with heart or lung disease.    Education: Flexibility, Balance, Mind/Body Relaxation: Provides group verbal/written instruction on the benefits of flexibility and balance training, including mind/body exercise modes such as yoga, pilates and tai chi.  Demonstration and skill practice provided.   Activity Barriers & Risk Stratification:  Activity Barriers & Cardiac Risk Stratification - 12/11/19 1343      Activity Barriers & Cardiac Risk Stratification   Activity Barriers Balance Concerns;History of Falls;Muscular Weakness;Other (comment)    Comments lupus- toes and ankle numb; trembling (started within the last month)    Cardiac Risk Stratification High           6 Minute Walk:  6 Minute Walk    Row Name 12/16/19 1308         6 Minute Walk   Phase Initial     Distance 1100 feet     Walk Time 6 minutes     # of Rest Breaks 0     MPH 2.08     METS 2.37     RPE 11     Perceived Dyspnea  1     VO2 Peak 8.32     Symptoms No     Resting HR 60 bpm     Resting BP 90/52     Resting Oxygen Saturation  95 %     Exercise Oxygen Saturation   during 6 min walk 98 %     Max Ex. HR 69 bpm     Max Ex. BP 110/58     2 Minute Post BP 96/60  Oxygen Initial Assessment:   Oxygen Re-Evaluation:   Oxygen Discharge (Final Oxygen Re-Evaluation):   Initial Exercise Prescription:  Initial Exercise Prescription - 12/17/19 0700      Date of Initial Exercise RX and Referring Provider   Date 12/16/19    Referring Provider Herold Harms, MD ([place])      Treadmill   MPH 1.5    Grade 0    Minutes 15    METs 2.15      Recumbant Bike   Level 1    RPM 60    Watts 10    Minutes 15    METs 2.3      NuStep   Level 1    SPM 80    Minutes 15    METs 2.3      REL-XR   Level 1    Speed 50    Minutes 15    METs 2.3      T5 Nustep   Level 1    SPM 80    Minutes 15    METs 2.3      Prescription Details   Frequency (times per week) 3      Intensity   THRR 40-80% of Max Heartrate 94-128    Ratings of Perceived Exertion 11-13    Perceived Dyspnea 0-4      Progression   Progression Continue to progress workloads to maintain intensity without signs/symptoms of physical distress.      Resistance Training   Training Prescription Yes    Arellano 3 lb    Reps 10-15           Perform Capillary Blood Glucose checks as needed.  Exercise Prescription Changes:  Exercise Prescription Changes    Row Name 12/16/19 1300 12/16/19 1500 12/22/19 1200 01/18/20 1700 02/01/20 1500     Response to Exercise   Blood Pressure (Admit) 90/52 90/52 1_0   Blood Pressure (Exercise) 110/58 110/58 106/56 108/58 124/70   Blood Pressure (Exit) 96/60 96/60 102/62 106/68 90/42   Heart Rate (Admit) 60 bpm 60 bpm 76 bpm 71 bpm 73 bpm   Heart Rate (Exercise) 69 bpm 69 bpm 94 bpm 84 bpm 91 bpm   Heart Rate (Exit) 64 bpm 64 bpm 63 bpm 68 bpm 72 bpm   Oxygen Saturation (Admit) 95 % 95 % -- -- --   Oxygen Saturation (Exercise) 98 % 98 % -- -- --   Oxygen Saturation (Exit) 97 % 97 % -- -- --   Rating of Perceived Exertion  (Exercise) _1 Perceived Dyspnea (Exercise) 1 1 -- -- --   Symptoms _2    Comments Walk Test Results Walk Test Results -- -- --   Duration -- -- Progress to 30 minutes of  aerobic without signs/symptoms of physical distress Continue with 30 min of aerobic exercise without signs/symptoms of physical distress. Continue with 30 min of aerobic exercise without signs/symptoms of physical distress.   Intensity -- -- THRR unchanged THRR unchanged THRR unchanged     Progression   Progression -- -- Continue to progress workloads to maintain intensity without signs/symptoms of physical distress. Continue to progress workloads to maintain intensity without signs/symptoms of physical distress. Continue to progress workloads to maintain intensity without signs/symptoms of physical distress.   Average METs -- -- 2.66 2.4 2.07     Resistance Training   Training Prescription -- -- Yes Yes Yes   Arellano 3 lb 3 lb  3 lb 3 lb 3 lb   Reps 10-15 10-15 10-15 10-15 10-15     Interval Training   Interval Training _0      Treadmill   MPH 1.5 1.5 -- -- 2.1   Grade 0 0 -- -- 0   Minutes 15 15 -- -- 15   METs 2.15 2.15 -- -- 2.61     Recumbant Bike   Level _1 RPM 60 60 60 60 --   Watts 10 10 -- -- 12   Minutes _2 METs 2.3 2.3 3.12 3.12 2.67     NuStep   Level _3 SPM 80 80 80 80 --   Minutes _4 METs 2.3 2.3 2.2 1.7 1.5     REL-XR   Level 1 1 -- -- --   Speed -- 50 -- -- --   Minutes 15 15 -- -- --   METs 2.3 2.3 -- -- --     T5 Nustep   Level 1 1 -- -- 3   SPM 80 80 -- -- --   Minutes 15 15 -- -- 15   METs 2.3 2.3 -- -- 1.7          Exercise Comments:   Exercise Goals and Review:  Exercise Goals    Row Name 12/16/19 1321             Exercise Goals   Increase Physical Activity Yes       Intervention Provide advice, education, support and counseling about physical activity/exercise  needs.;Develop an individualized exercise prescription for aerobic and resistive training based on initial evaluation findings, risk stratification, comorbidities and participant's personal goals.       Expected Outcomes Short Term: Attend rehab on a regular basis to increase amount of physical activity.;Long Term: Add in home exercise to make exercise part of routine and to increase amount of physical activity.;Long Term: Exercising regularly at least 3-5 days a week.       Increase Strength and Stamina Yes       Intervention Provide advice, education, support and counseling about physical activity/exercise needs.;Develop an individualized exercise prescription for aerobic and resistive training based on initial evaluation findings, risk stratification, comorbidities and participant's personal goals.       Expected Outcomes Short Term: Increase workloads from initial exercise prescription for resistance, speed, and METs.;Long Term: Improve cardiorespiratory fitness, muscular endurance and strength as measured by increased METs and functional capacity (6MWT);Short Term: Perform resistance training exercises routinely during rehab and add in resistance training at home       Able to understand and use rate of perceived exertion (RPE) scale Yes       Intervention Provide education and explanation on how to use RPE scale       Expected Outcomes Short Term: Able to use RPE daily in rehab to express subjective intensity level;Long Term:  Able to use RPE to guide intensity level when exercising independently       Able to understand and use Dyspnea scale Yes       Intervention Provide education and explanation on how to use Dyspnea scale       Expected Outcomes Short Term: Able to use Dyspnea scale daily in rehab to express subjective sense of shortness of breath during exertion;Long Term: Able to use Dyspnea scale to guide intensity level  when exercising independently       Knowledge and understanding of  Target Heart Rate Range (THRR) Yes       Intervention Provide education and explanation of THRR including how the numbers were predicted and where they are located for reference       Expected Outcomes Short Term: Able to state/look up THRR;Short Term: Able to use daily as guideline for intensity in rehab;Long Term: Able to use THRR to govern intensity when exercising independently       Able to check pulse independently Yes       Intervention Provide education and demonstration on how to check pulse in carotid and radial arteries.;Review the importance of being able to check your own pulse for safety during independent exercise       Expected Outcomes Short Term: Able to explain why pulse checking is important during independent exercise;Long Term: Able to check pulse independently and accurately       Understanding of Exercise Prescription Yes       Intervention Provide education, explanation, and written materials on patient's individual exercise prescription       Expected Outcomes Short Term: Able to explain program exercise prescription;Long Term: Able to explain home exercise prescription to exercise independently              Exercise Goals Re-Evaluation :  Exercise Goals Re-Evaluation    Row Name 12/22/19 1248 01/04/20 1116 01/18/20 1730 02/01/20 1521       Exercise Goal Re-Evaluation   Exercise Goals Review Increase Physical Activity;Increase Strength and Stamina;Understanding of Exercise Prescription Increase Physical Activity;Increase Strength and Stamina;Understanding of Exercise Prescription Increase Physical Activity;Increase Strength and Stamina;Understanding of Exercise Prescription Increase Physical Activity;Increase Strength and Stamina;Understanding of Exercise Prescription    Comments First full day of exercise!  Patient was oriented to gym and equipment including functions, settings, policies, and procedures.  Patient's individual exercise prescription and treatment plan  were reviewed.  All starting workloads were established based on the results of the 6 minute walk test done at initial orientation visit.  The plan for exercise progression was also introduced and progression will be customized based on patient's performance and goals. Cody Arellano has been coming to rehab, but has been missing some sessions. He feels better and is ready to exercise again. Cody Arellano does not currently do exercise at home but staff will review home exercise instructions when ready. Cody Arellano is up to level 4 on NS.  he works at [employer] 12[employer]13.  Staff will monitor progress. Currently in hospital for sepsis.  He will need clearance to return.    Expected Outcomes Short: Use RPE daily to regulate intensity. Long: Follow program prescription in THR. Short: Review home exercise Long: Able to exercise indepedently at home following THR Short:  continue to increase levels on machines Long:  improve overal stamina and MET level Short: Clearance to return to rehab Long: Continue to improve stamina.           Discharge Exercise Prescription (Final Exercise Prescription Changes):  Exercise Prescription Changes - 02/01/20 1500      Response to Exercise   Blood Pressure (Admit) 90/42    Blood Pressure (Exercise) 124/70    Blood Pressure (Exit) 90/42    Heart Rate (Admit) 73 bpm    Heart Rate (Exercise) 91 bpm    Heart Rate (Exit) 72 bpm    Rating of Perceived Exertion (Exercise) 12    Symptoms none    Duration Continue with 30 min of  aerobic exercise without signs/symptoms of physical distress.    Intensity THRR unchanged      Progression   Progression Continue to progress workloads to maintain intensity without signs/symptoms of physical distress.    Average METs 2.07      Resistance Training   Training Prescription Yes    Arellano 3 lb    Reps 10-15      Interval Training   Interval Training No      Treadmill   MPH 2.1    Grade 0    Minutes 15    METs 2.61      Recumbant Bike   Level 3     Watts 12    Minutes 15    METs 2.67      NuStep   Level 4    Minutes 15    METs 1.5      T5 Nustep   Level 3    Minutes 15    METs 1.7           Nutrition:  Target Goals: Understanding of nutrition guidelines, daily intake of sodium <1518m, cholesterol <2051m calories 30% from fat and 7% or less from saturated fats, daily to have 5 or more servings of fruits and vegetables.  Education: Controlling Sodium/Reading Food Labels -Group verbal and written material supporting the discussion of sodium use in heart healthy nutrition. Review and explanation with models, verbal and written materials for utilization of the food label.   Education: General Nutrition Guidelines/Fats and Fiber: -Group instruction provided by verbal, written material, models and posters to present the general guidelines for heart healthy nutrition. Gives an explanation and review of dietary fats and fiber.   Biometrics:  Pre Biometrics - 12/16/19 1310      Pre Biometrics   Height _0  (1.803 m)    Arellano 154 lb 1.6 oz (69.9 kg)    BMI (Calculated) 21.5    Single Leg Stand 14.28 seconds            Nutrition Therapy Plan and Nutrition Goals:  Nutrition Therapy & Goals - 01/18/20 1431      Nutrition Therapy   Diet Low Na, heart healthy, high kcal, high protein    Protein (specify units) 60-65g    Fiber 30 grams    Whole Grain Foods 3 servings    Saturated Fats 12 max. grams    Fruits and Vegetables 5 servings/day    Sodium 1.5 grams      Personal Nutrition Goals   Nutrition Goal ST: ensure BID, add heart healthy fats like soft margerine to popcorn, add small frequent meals LT: improve sleep, feel better during the day, golf and go out to have dinner with his wife and play with his grandkids    Comments Pt primary diagnosis for Cardiac Rehab is S/P CABG x2. Pt also presents with CAD, a-fib, STEMI, lupus pericarditis, interstitial lung disease, HLD, memory loss. Per recent office visit with  the cardiothorasic doctor he continues to lose Arellano due to low intake <1200kcal/day and he is extrememly fatigued. Current Arellano 69.9kg. 73.5kg 11/16/19 - now 69.9.Current Relevant Medications: cozaar, zoloft, oxycodone, crestor. Pt reports going downhill after his surgery. Plans to go to [place] to see his new grandchild. Pt reports his appetite is low unless he loves the food. B: three sausage, waffles, eggs and coffee and orange juice --> two links, egg, and waffle. May not eat until 4pm now - if it weren't for the medicine he wouldn't  eat anything at night. Pt reports having ensure to help. D: cabbage and smoked sausage and cornbread and broccoli or spaghetti or baked chicken and mashed potatoes. He reports not consistently finishing his meals at night. Pt would like to take ensure BID. Discussed eart healthy eating and some high calorie, high protein MNT tactics so that he will not continue to lose Arellano and meet his needs.      Intervention Plan   Intervention Prescribe, educate and counsel regarding individualized specific dietary modifications aiming towards targeted core components such as Arellano, hypertension, lipid management, diabetes, heart failure and other comorbidities.;Nutrition handout(s) given to patient.    Expected Outcomes Short Term Goal: Understand basic principles of dietary content, such as calories, fat, sodium, cholesterol and nutrients.;Short Term Goal: A plan has been developed with personal nutrition goals set during dietitian appointment.;Long Term Goal: Adherence to prescribed nutrition plan.           Nutrition Assessments:  Nutrition Assessments - 12/16/19 1527      MEDFICTS Scores   Pre Score 25           MEDIFICTS Score Key:          ?70 Need to make dietary changes          40-70 Heart Healthy Diet         ? 40 Therapeutic Level Cholesterol Diet  Nutrition Goals Re-Evaluation:   Nutrition Goals Discharge (Final Nutrition Goals  Re-Evaluation):   Psychosocial: Target Goals: Acknowledge presence or absence of significant depression and/or stress, maximize coping skills, provide positive support system. Participant is able to verbalize types and ability to use techniques and skills needed for reducing stress and depression.   Education: Depression - Provides group verbal and written instruction on the correlation between heart/lung disease and depressed mood, treatment options, and the stigmas associated with seeking treatment.   Cardiac Rehab from 01/20/2020 in [facility] Cardiac and Pulmonary Rehab  Date 01/06/20  Educator [facility]  Instruction Review Code 1- [employer] Understanding      Education: Sleep Hygiene -Provides group verbal and written instruction about how sleep can affect your health.  Define sleep hygiene, discuss sleep cycles and impact of sleep habits. Review good sleep hygiene tips.     Education: Stress and Anxiety: - Provides group verbal and written instruction about the health risks of elevated stress and causes of high stress.  Discuss the correlation between heart/lung disease and anxiety and treatment options. Review healthy ways to manage with stress and anxiety.   Cardiac Rehab from 01/20/2020 in [facility] Cardiac and Pulmonary Rehab  Date 01/06/20  Educator [facility]  Instruction Review Code 1- Verbalizes Understanding       Initial Review & Psychosocial Screening:  Initial Psych Review & Screening - 12/11/19 1338      Initial Review   Current issues with Current Stress Concerns    Source of Stress Concerns Chronic Illness      Family Dynamics   Good Support System? Yes   wife, sons, grandkids     Barriers   Psychosocial barriers to participate in program There are no identifiable barriers or psychosocial needs.      Screening Interventions   Interventions Encouraged to exercise;To provide support and resources with identified psychosocial needs;Provide feedback about the scores to participant     Expected Outcomes Short Term goal: Utilizing psychosocial counselor, staff and physician to assist with identification of specific Stressors or current issues interfering with healing process. Setting desired goal for each  stressor or current issue identified.;Long Term Goal: Stressors or current issues are controlled or eliminated.;Short Term goal: Identification and review with participant of any Quality of Life or Depression concerns found by scoring the questionnaire.;Long Term goal: The participant improves quality of Life and PHQ9 Scores as seen by post scores and/or verbalization of changes           Quality of Life Scores:   Quality of Life - 12/16/19 1526      Quality of Life   Select Quality of Life      Quality of Life Scores   Health/Function Pre 14.9 %    Socioeconomic Pre 22 %    Psych/Spiritual Pre 23.93 %    Family Pre 24 %    GLOBAL Pre 19.56 %          Scores of 19 and below usually indicate a poorer quality of life in these areas.  A difference of  2-3 points is a clinically meaningful difference.  A difference of 2-3 points in the total score of the Quality of Life Index has been associated with significant improvement in overall quality of life, self-image, physical symptoms, and general health in studies assessing change in quality of life.  PHQ-9: Recent Review Flowsheet Data    Depression screen [facility] 2/9 01/13/2020 12/16/2019 01/20/2019 01/09/2018   Decreased Interest 0 0 0 0   Down, Depressed, Hopeless _0 0   PHQ - 2 Score _1 0   Altered sleeping 2 3 - -   Tired, decreased energy 2 3 - -   Change in appetite 1 1 - -   Feeling bad or failure about yourself  0 2 - -   Trouble concentrating 0 1 - -   Moving slowly or fidgety/restless 0 0 - -   Suicidal thoughts 0 0 - -   PHQ-9 Score 6 11 - -   Difficult doing work/chores Somewhat difficult Somewhat difficult - -     Interpretation of Total Score  Total Score Depression Severity:  1-4 = Minimal  depression, 5-9 = Mild depression, 10-14 = Moderate depression, 15-19 = Moderately severe depression, 20-27 = Severe depression   Psychosocial Evaluation and Intervention:  Psychosocial Evaluation - 01/22/20 1130      Psychosocial Evaluation & Interventions   Interventions Encouraged to exercise with the program and follow exercise prescription    Comments Patient reports no issues with their current mental states, sleep, stress, depression or anxiety. Will follow up with patient in a few weeks for any changes.    Expected Outcomes Short: Continue to exercise regularly to support mental health and notify staff of any changes. Long: maintain mental health and well being through teaching of rehab or prescribed medications independently.    Continue Psychosocial Services  Follow up required by staff           Psychosocial Re-Evaluation:  Psychosocial Re-Evaluation    Cody Arellano Name 01/04/20 1134 01/13/20 1138           Psychosocial Re-Evaluation   Current issues with -- Current Stress Concerns      Comments Cody Arellano struggles staying asleep, but also has been sick so he thinks that was has been contributing to it. Overall, he reports feeling well mentally. He states that he is getting around better. Patient's PHQ9 improved and he states its from his family being more supportive. He feels liberated that he has the support and he continues to enjoy exercising  Expected Outcomes Short: Utilize exercise to help maintain better sleep patterns Long: Maintain positive attitude Short: continue to come to cardiac rehab for exercise. Long: develop positive self care habits      Continue Psychosocial Services  Follow up required by staff Follow up required by staff             Psychosocial Discharge (Final Psychosocial Re-Evaluation):  Psychosocial Re-Evaluation - 01/13/20 1138      Psychosocial Re-Evaluation   Current issues with Current Stress Concerns    Comments Patient's PHQ9 improved and he  states its from his family being more supportive. He feels liberated that he has the support and he continues to enjoy exercising    Expected Outcomes Short: continue to come to cardiac rehab for exercise. Long: develop positive self care habits    Continue Psychosocial Services  Follow up required by staff           Vocational Rehabilitation: Provide vocational rehab assistance to qualifying candidates.   Vocational Rehab Evaluation & Intervention:  Vocational Rehab - 12/11/19 1338      Initial Vocational Rehab Evaluation & Intervention   Assessment shows need for Vocational Rehabilitation No           Education: Education Goals: Education classes will be provided on a variety of topics geared toward better understanding of heart health and risk factor modification. Participant will state understanding/return demonstration of topics presented as noted by education test scores.  Learning Barriers/Preferences:  Learning Barriers/Preferences - 12/11/19 1338      Learning Barriers/Preferences   Learning Barriers None    Learning Preferences None           General Cardiac Education Topics:  AED/CPR: - Group verbal and written instruction with the use of models to demonstrate the basic use of the AED with the basic ABC's of resuscitation.   Anatomy & Physiology of the Heart: - Group verbal and written instruction and models provide basic cardiac anatomy and physiology, with the coronary electrical and arterial systems. Review of Valvular disease and Heart Failure   Cardiac Procedures: - Group verbal and written instruction to review commonly prescribed medications for heart disease. Reviews the medication, class of the drug, and side effects. Includes the steps to properly store meds and maintain the prescription regimen. (beta blockers and nitrates)   Cardiac Medications I: - Group verbal and written instruction to review commonly prescribed medications for heart  disease. Reviews the medication, class of the drug, and side effects. Includes the steps to properly store meds and maintain the prescription regimen.   Cardiac Medications II: -Group verbal and written instruction to review commonly prescribed medications for heart disease. Reviews the medication, class of the drug, and side effects. (all other drug classes)   Cardiac Rehab from 01/20/2020 in [facility] Cardiac and Pulmonary Rehab  Date 01/13/20  Educator [facility]  Instruction Review Code 1- Verbalizes Understanding       Go Sex-Intimacy & Heart Disease, Get SMART - Goal Setting: - Group verbal and written instruction through game format to discuss heart disease and the return to sexual intimacy. Provides group verbal and written material to discuss and apply goal setting through the application of the S.M.A.R.T. Method.   Other Matters of the Heart: - Provides group verbal, written materials and models to describe Stable Angina and Peripheral Artery. Includes description of the disease process and treatment options available to the cardiac patient.   Infection Prevention: - Provides verbal and written material to individual  with discussion of infection control including proper hand washing and proper equipment cleaning during exercise session.   Cardiac Rehab from 01/20/2020 in [facility] Cardiac and Pulmonary Rehab  Date 12/16/19  Educator [place]  Instruction Review Code 1- Verbalizes Understanding      Falls Prevention: - Provides verbal and written material to individual with discussion of falls prevention and safety.   Cardiac Rehab from 01/20/2020 in [facility] Cardiac and Pulmonary Rehab  Date 12/16/19  Educator [place]  Instruction Review Code 1- Verbalizes Understanding      Other: -Provides group and verbal instruction on various topics (see comments)   Knowledge Questionnaire Score:  Knowledge Questionnaire Score - 12/16/19 1325      Knowledge Questionnaire Score   Pre Score 21/26: Heart  Failure, Nutrition, Exercise           Core Components/Risk Factors/Patient Goals at Admission:  Personal Goals and Risk Factors at Admission - 12/16/19 1322      Core Components/Risk Factors/Patient Goals on Admission    Arellano Management Arellano Maintenance;Yes    Intervention Arellano Management: Develop a combined nutrition and exercise program designed to reach desired caloric intake, while maintaining appropriate intake of nutrient and fiber, sodium and fats, and appropriate energy expenditure required for the Arellano goal.;Arellano Management: Provide education and appropriate resources to help participant work on and attain dietary goals.;Arellano Management/Obesity: Establish reasonable short term and long term Arellano goals.    Admit Arellano 154 lb 1.6 oz (69.9 kg)    Goal Arellano: Short Term 154 lb 1.6 oz (69.9 kg)    Goal Arellano: Long Term 154 lb 1.6 oz (69.9 kg)    Expected Outcomes Short Term: Continue to assess and modify interventions until short term Arellano is achieved;Long Term: Adherence to nutrition and physical activity/exercise program aimed toward attainment of established Arellano goal;Arellano Maintenance: Understanding of the daily nutrition guidelines, which includes 25-35% calories from fat, 7% or less cal from saturated fats, less than 263m cholesterol, less than 1.5gm of sodium, & 5 or more servings of fruits and vegetables daily;Understanding recommendations for meals to include 15-35% energy as protein, 25-35% energy from fat, 35-60% energy from carbohydrates, less than 2042mof dietary cholesterol, 20-35 gm of total fiber daily;Understanding of distribution of calorie intake throughout the day with the consumption of 4-5 meals/snacks    Hypertension Yes    Intervention Provide education on lifestyle modifcations including regular physical activity/exercise, Arellano management, moderate sodium restriction and increased consumption of fresh fruit, vegetables, and low fat dairy,  alcohol moderation, and smoking cessation.;Monitor prescription use compliance.    Expected Outcomes Short Term: Continued assessment and intervention until BP is < 140/909mG in hypertensive participants. < 130/55m71m in hypertensive participants with diabetes, heart failure or chronic kidney disease.;Long Term: Maintenance of blood pressure at goal levels.    Lipids Yes    Intervention Provide education and support for participant on nutrition & aerobic/resistive exercise along with prescribed medications to achieve LDL <70mg21mL >40mg.66mExpected Outcomes Short Term: Participant states understanding of desired cholesterol values and is compliant with medications prescribed. Participant is following exercise prescription and nutrition guidelines.;Long Term: Cholesterol controlled with medications as prescribed, with individualized exercise RX and with personalized nutrition plan. Value goals: LDL < 70mg, 62m> 40 mg.           Education:Diabetes - Individual verbal and written instruction to review signs/symptoms of diabetes, desired ranges of glucose level fasting, after meals and with exercise. Acknowledge that pre  and post exercise glucose checks will be done for 3 sessions at entry of program.   Education: Know Your Numbers and Risk Factors: -Group verbal and written instruction about important numbers in your health.  Discussion of what are risk factors and how they play a role in the disease process.  Review of Cholesterol, Blood Pressure, Diabetes, and BMI and the role they play in your overall health.   Cardiac Rehab from 01/20/2020 in [facility] Cardiac and Pulmonary Rehab  Date 01/13/20  Educator [facility]  Instruction Review Code 1- Verbalizes Understanding      Core Components/Risk Factors/Patient Goals Review:   Goals and Risk Factor Review    Row Name 01/04/20 1121 01/22/20 1128           Core Components/Risk Factors/Patient Goals Review   Personal Goals Review Arellano  Management/Obesity;Hypertension;Lipids Arellano Management/Obesity;Hypertension;Lipids      Review Cody Arellano has been doing well, Arellano has been maintained. BPs at rehab have been stable. He reports he has a BP monitor at home but is unsure if it works. Encouraged to bring it to rehab to check function. Cody Arellano states he will buy a new one if it continues to not work. Him and his wife cook home a lot and has been eating fairly healthy. Cody Arellano. He has spoke to the dietician about his diet and is ready some changes. He wants to be around 165 pounds for his Arellano. He is currently at 150 pounds. He has lost 4 pounds sice he has started the program. His blood pressure has been stable and has not been hypotensive.      Expected Outcomes Short: Check BP at home, obtain new monitor if it continues to not work Long: Continue to manage lifestyle risk factors Short: use diet changes to gain some Arellano. Long: Gain Arellano of 165 pounds.             Core Components/Risk Factors/Patient Goals at Discharge (Final Review):   Goals and Risk Factor Review - 01/22/20 1128      Core Components/Risk Factors/Patient Goals Review   Personal Goals Review Arellano Management/Obesity;Hypertension;Lipids    Review Cody Arellano would like to gain some Arellano. He has spoke to the dietician about his diet and is ready some changes. He wants to be around 165 pounds for his Arellano. He is currently at 150 pounds. He has lost 4 pounds sice he has started the program. His blood pressure has been stable and has not been hypotensive.    Expected Outcomes Short: use diet changes to gain some Arellano. Long: Gain Arellano of 165 pounds.           ITP Comments:  ITP Comments    Row Name 12/11/19 1354 12/16/19 1307 01/13/20 0728 01/18/20 1726 02/01/20 1520   ITP Comments Initial orientation completed. Diagnosis can be found in [facility] 5/15. EP orientation scheduled for Wednesday, 7/14 at 11 am. Completed 6MWT and gym  orientation. Initial ITP created and sent for review to Dr. Emily Filbert, Medical Director. 30 Day review completed. Medical Director ITP review done, changes made as directed, and signed approval by Medical Director. Completed initial RD evaluation Currently hospitalized for sepsis.   [place] Name 02/03/20 1135 02/10/20 1638 03/02/20 1524 03/09/20 0631 03/15/20 1440   ITP Comments Dellis Filbert called to let us know he just got home from the hospital and is not sure how long before he can return to the program.  We will check with him at end  of next week to see how he is healing. 30 day review completed. ITP sent to Dr. Emily Filbert, Medical Director of Cardiac and Pulmonary Rehab. Continue with ITP unless changes are made by physician. Called and spoke with patient. He is still recovering from surgery and is unsure when he is able to come back to [facility]. Patient states doctor is restricting lifting and driving at this time. Informed patient we will wait a couple weeks, re-assess at that time. Will talk about early discharge if needed. 30 Day review completed. Medical Director ITP review done, changes made as directed, and signed approval by Medical Director. Called to check on patient.  He is now recovering from pneumonia too.  He would like to discharge at this time.          Comments: Discharge ITP

## 2020-03-15 NOTE — Progress Notes (Signed)
Discharge Progress Report  Patient Details  Name: Cody Arellano MRN: [id number] Date of Birth: 1946/02/09 Referring Provider:     Cardiac Rehab from 12/16/2019 in [facility]  Referring Provider Herold Harms, MD ([place])       Number of Visits: 16  Reason for Discharge:  Early Exit:  Personal  Smoking History:  Social History   Tobacco Use  Smoking Status Former Smoker  . Packs/day: 1.00  . Years: 20.00  . Pack years: 20.00  . Types: Cigarettes  . Quit date: 02/26/1993  . Years since quitting: 27.0  Smokeless Tobacco Never Used  Tobacco Comment   QUIT SMOKING 20 YEARS AGO    Diagnosis:  S/P CABG x 2  ADL UCSD:   Initial Exercise Prescription:  Initial Exercise Prescription - 12/17/19 0700      Date of Initial Exercise RX and Referring Provider   Date 12/16/19    Referring Provider Herold Harms, [place] ([place])      Treadmill   MPH 1.5    Grade 0    Minutes 15    METs 2.15      Recumbant Bike   Level 1    RPM 60    Watts 10    Minutes 15    METs 2.3      NuStep   Level 1    SPM 80    Minutes 15    METs 2.3      REL-XR   Level 1    Speed 50    Minutes 15    METs 2.3      T5 Nustep   Level 1    SPM 80    Minutes 15    METs 2.3      Prescription Details   Frequency (times per week) 3      Intensity   THRR 40-80% of Max Heartrate 94-128    Ratings of Perceived Exertion 11-13    Perceived Dyspnea 0-4      Progression   Progression Continue to progress workloads to maintain intensity without signs/symptoms of physical distress.      Resistance Training   Training Prescription Yes    Weight 3 lb    Reps 10-15           Discharge Exercise Prescription (Final Exercise Prescription Changes):  Exercise Prescription Changes - 02/01/20 1500      Response to Exercise   Blood Pressure (Admit) 90/42    Blood Pressure (Exercise) 124/70    Blood Pressure (Exit) 90/42    Heart Rate (Admit) 73 bpm    Heart Rate (Exercise)  91 bpm    Heart Rate (Exit) 72 bpm    Rating of Perceived Exertion (Exercise) 12    Symptoms none    Duration Continue with 30 min of aerobic exercise without signs/symptoms of physical distress.    Intensity THRR unchanged      Progression   Progression Continue to progress workloads to maintain intensity without signs/symptoms of physical distress.    Average METs 2.07      Resistance Training   Training Prescription Yes    Weight 3 lb    Reps 10-15      Interval Training   Interval Training No      Treadmill   MPH 2.1    Grade 0    Minutes 15    METs 2.61      Recumbant Bike   Level 3    Watts  12    Minutes 15    METs 2.67      NuStep   Level 4    Minutes 15    METs 1.5      T5 Nustep   Level 3    Minutes 15    METs 1.7           Functional Capacity:  6 Minute Walk    Row Name 12/16/19 1308         6 Minute Walk   Phase Initial     Distance 1100 feet     Walk Time 6 minutes     # of Rest Breaks 0     MPH 2.08     METS 2.37     RPE 11     Perceived Dyspnea  1     VO2 Peak 8.32     Symptoms No     Resting HR 60 bpm     Resting BP 90/52     Resting Oxygen Saturation  95 %     Exercise Oxygen Saturation  during 6 min walk 98 %     Max Ex. HR 69 bpm     Max Ex. BP 110/58     2 Minute Post BP 96/60            Psychological, QOL, Others - Outcomes: PHQ 2/9: Depression screen [facility] 2/9 01/13/2020 12/16/2019 01/20/2019 01/09/2018  Decreased Interest 0 0 0 0  Down, Depressed, Hopeless _0 0  PHQ - 2 Score _1 0  Altered sleeping 2 3 - -  Tired, decreased energy 2 3 - -  Change in appetite 1 1 - -  Feeling bad or failure about yourself  0 2 - -  Trouble concentrating 0 1 - -  Moving slowly or fidgety/restless 0 0 - -  Suicidal thoughts 0 0 - -  PHQ-9 Score 6 11 - -  Difficult doing work/chores Somewhat difficult Somewhat difficult - -    Quality of Life:  Quality of Life - 12/16/19 1526      Quality of Life   Select Quality of Life       Quality of Life Scores   Health/Function Pre 14.9 %    Socioeconomic Pre 22 %    Psych/Spiritual Pre 23.93 %    Family Pre 24 %    GLOBAL Pre 19.56 %           Personal Goals: Goals established at orientation with interventions provided to work toward goal.  Personal Goals and Risk Factors at Admission - 12/16/19 1322      Core Components/Risk Factors/Patient Goals on Admission    Weight Management Weight Maintenance;Yes    Intervention Weight Management: Develop a combined nutrition and exercise program designed to reach desired caloric intake, while maintaining appropriate intake of nutrient and fiber, sodium and fats, and appropriate energy expenditure required for the weight goal.;Weight Management: Provide education and appropriate resources to help participant work on and attain dietary goals.;Weight Management/Obesity: Establish reasonable short term and long term weight goals.    Admit Weight 154 lb 1.6 oz (69.9 kg)    Goal Weight: Short Term 154 lb 1.6 oz (69.9 kg)    Goal Weight: Long Term 154 lb 1.6 oz (69.9 kg)    Expected Outcomes Short Term: Continue to assess and modify interventions until short term weight is achieved;Long Term: Adherence to nutrition and physical activity/exercise program aimed toward attainment of established weight  goal;Weight Maintenance: Understanding of the daily nutrition guidelines, which includes 25-35% calories from fat, 7% or less cal from saturated fats, less than 237m cholesterol, less than 1.5gm of sodium, & 5 or more servings of fruits and vegetables daily;Understanding recommendations for meals to include 15-35% energy as protein, 25-35% energy from fat, 35-60% energy from carbohydrates, less than 2034mof dietary cholesterol, 20-35 gm of total fiber daily;Understanding of distribution of calorie intake throughout the day with the consumption of 4-5 meals/snacks    Hypertension Yes    Intervention Provide education on lifestyle  modifcations including regular physical activity/exercise, weight management, moderate sodium restriction and increased consumption of fresh fruit, vegetables, and low fat dairy, alcohol moderation, and smoking cessation.;Monitor prescription use compliance.    Expected Outcomes Short Term: Continued assessment and intervention until BP is < 140/9079mG in hypertensive participants. < 130/52m43m in hypertensive participants with diabetes, heart failure or chronic kidney disease.;Long Term: Maintenance of blood pressure at goal levels.    Lipids Yes    Intervention Provide education and support for participant on nutrition & aerobic/resistive exercise along with prescribed medications to achieve LDL <70mg49mL >40mg.62mExpected Outcomes Short Term: Participant states understanding of desired cholesterol values and is compliant with medications prescribed. Participant is following exercise prescription and nutrition guidelines.;Long Term: Cholesterol controlled with medications as prescribed, with individualized exercise RX and with personalized nutrition plan. Value goals: LDL < 70mg, 96m> 40 mg.            Personal Goals Discharge:  Goals and Risk Factor Review    Row Name 01/04/20 1121 01/22/20 1128           Core Components/Risk Factors/Patient Goals Review   Personal Goals Review Weight Management/Obesity;Hypertension;Lipids Weight Management/Obesity;Hypertension;Lipids      Review Walden hAutheren doing well, weight has been maintained. BPs at rehab have been stable. He reports he has a BP monitor at home but is unsure if it works. Encouraged to bring it to rehab to check function. Finnley sGeronimo he will buy a new one if it continues to not work. Him and his wife cook home a lot and has been eating fairly healthy. Jurell wDerellelike to gain some weight. He has spoke to the dietician about his diet and is ready some changes. He wants to be around 165 pounds for his weight. He is currently at 150  pounds. He has lost 4 pounds sice he has started the program. His blood pressure has been stable and has not been hypotensive.      Expected Outcomes Short: Check BP at home, obtain new monitor if it continues to not work Long: Continue to manage lifestyle risk factors Short: use diet changes to gain some weight. Long: Gain weight of 165 pounds.             Exercise Goals and Review:  Exercise Goals    Row Name 12/16/19 1321             Exercise Goals   Increase Physical Activity Yes       Intervention Provide advice, education, support and counseling about physical activity/exercise needs.;Develop an individualized exercise prescription for aerobic and resistive training based on initial evaluation findings, risk stratification, comorbidities and participant's personal goals.       Expected Outcomes Short Term: Attend rehab on a regular basis to increase amount of physical activity.;Long Term: Add in home exercise to make exercise part of routine and to increase amount  of physical activity.;Long Term: Exercising regularly at least 3-5 days a week.       Increase Strength and Stamina Yes       Intervention Provide advice, education, support and counseling about physical activity/exercise needs.;Develop an individualized exercise prescription for aerobic and resistive training based on initial evaluation findings, risk stratification, comorbidities and participant's personal goals.       Expected Outcomes Short Term: Increase workloads from initial exercise prescription for resistance, speed, and METs.;Long Term: Improve cardiorespiratory fitness, muscular endurance and strength as measured by increased METs and functional capacity (6MWT);Short Term: Perform resistance training exercises routinely during rehab and add in resistance training at home       Able to understand and use rate of perceived exertion (RPE) scale Yes       Intervention Provide education and explanation on how to use RPE  scale       Expected Outcomes Short Term: Able to use RPE daily in rehab to express subjective intensity level;Long Term:  Able to use RPE to guide intensity level when exercising independently       Able to understand and use Dyspnea scale Yes       Intervention Provide education and explanation on how to use Dyspnea scale       Expected Outcomes Short Term: Able to use Dyspnea scale daily in rehab to express subjective sense of shortness of breath during exertion;Long Term: Able to use Dyspnea scale to guide intensity level when exercising independently       Knowledge and understanding of Target Heart Rate Range (THRR) Yes       Intervention Provide education and explanation of THRR including how the numbers were predicted and where they are located for reference       Expected Outcomes Short Term: Able to state/look up THRR;Short Term: Able to use daily as guideline for intensity in rehab;Long Term: Able to use THRR to govern intensity when exercising independently       Able to check pulse independently Yes       Intervention Provide education and demonstration on how to check pulse in carotid and radial arteries.;Review the importance of being able to check your own pulse for safety during independent exercise       Expected Outcomes Short Term: Able to explain why pulse checking is important during independent exercise;Long Term: Able to check pulse independently and accurately       Understanding of Exercise Prescription Yes       Intervention Provide education, explanation, and written materials on patient's individual exercise prescription       Expected Outcomes Short Term: Able to explain program exercise prescription;Long Term: Able to explain home exercise prescription to exercise independently              Exercise Goals Re-Evaluation:  Exercise Goals Re-Evaluation    Row Name 12/22/19 1248 01/04/20 1116 01/18/20 1730 02/01/20 1521       Exercise Goal Re-Evaluation   Exercise  Goals Review Increase Physical Activity;Increase Strength and Stamina;Understanding of Exercise Prescription Increase Physical Activity;Increase Strength and Stamina;Understanding of Exercise Prescription Increase Physical Activity;Increase Strength and Stamina;Understanding of Exercise Prescription Increase Physical Activity;Increase Strength and Stamina;Understanding of Exercise Prescription    Comments First full day of exercise!  Patient was oriented to gym and equipment including functions, settings, policies, and procedures.  Patient's individual exercise prescription and treatment plan were reviewed.  All starting workloads were established based on the results of the 6 minute walk  test done at initial orientation visit.  The plan for exercise progression was also introduced and progression will be customized based on patient's performance and goals. Faris has been coming to rehab, but has been missing some sessions. He feels better and is ready to exercise again. Yordin does not currently do exercise at home but staff will review home exercise instructions when ready. Tykee is up to level 4 on NS.  he works at [employer].  Staff will monitor progress. Currently in hospital for sepsis.  He will need clearance to return.    Expected Outcomes Short: Use RPE daily to regulate intensity. Long: Follow program prescription in THR. Short: Review home exercise Long: Able to exercise indepedently at home following THR Short:  continue to increase levels on machines Long:  improve overal stamina and MET level Short: Clearance to return to rehab Long: Continue to improve stamina.           Nutrition & Weight - Outcomes:  Pre Biometrics - 12/16/19 1310      Pre Biometrics   Height _0  (1.803 m)    Weight 154 lb 1.6 oz (69.9 kg)    BMI (Calculated) 21.5    Single Leg Stand 14.28 seconds            Nutrition:  Nutrition Therapy & Goals - 01/18/20 1431      Nutrition Therapy   Diet Low Na, heart  healthy, high kcal, high protein    Protein (specify units) 60-65g    Fiber 30 grams    Whole Grain Foods 3 servings    Saturated Fats 12 max. grams    Fruits and Vegetables 5 servings/day    Sodium 1.5 grams      Personal Nutrition Goals   Nutrition Goal ST: ensure BID, add heart healthy fats like soft margerine to popcorn, add small frequent meals LT: improve sleep, feel better during the day, golf and go out to have dinner with his wife and play with his grandkids    Comments Pt primary diagnosis for Cardiac Rehab is S/P CABG x2. Pt also presents with CAD, a-fib, STEMI, lupus pericarditis, interstitial lung disease, HLD, memory loss. Per recent office visit with the cardiothorasic doctor he continues to lose weight due to low intake <1200kcal/day and he is extrememly fatigued. Current weight 69.9kg. 73.5kg 11/16/19 - now 69.9.Current Relevant Medications: cozaar, zoloft, oxycodone, crestor. Pt reports going downhill after his surgery. Plans to go to [place] in November to see his new grandchild. Pt reports his appetite is low unless he loves the food. B: three sausage, waffles, eggs and coffee and orange juice --> two links, egg, and waffle. May not eat until 4pm now - if it weren't for the medicine he wouldn't eat anything at night. Pt reports having ensure to help. D: cabbage and smoked sausage and cornbread and broccoli or spaghetti or baked chicken and mashed potatoes. He reports not consistently finishing his meals at night. Pt would like to take ensure BID. Discussed eart healthy eating and some high calorie, high protein MNT tactics so that he will not continue to lose weight and meet his needs.      Intervention Plan   Intervention Prescribe, educate and counsel regarding individualized specific dietary modifications aiming towards targeted core components such as weight, hypertension, lipid management, diabetes, heart failure and other comorbidities.;Nutrition handout(s) given to patient.     Expected Outcomes Short Term Goal: Understand basic principles of dietary content, such as calories, fat, sodium,  cholesterol and nutrients.;Short Term Goal: A plan has been developed with personal nutrition goals set during dietitian appointment.;Long Term Goal: Adherence to prescribed nutrition plan.           Nutrition Discharge:  Nutrition Assessments - 12/16/19 1527      MEDFICTS Scores   Pre Score 25           Education Questionnaire Score:  Knowledge Questionnaire Score - 12/16/19 1325      Knowledge Questionnaire Score   Pre Score 21/26: Heart Failure, Nutrition, Exercise           Goals reviewed with patient; copy given to patient.

## 2020-03-17 ENCOUNTER — Telehealth: Payer: Self-pay

## 2020-03-17 NOTE — Telephone Encounter (Signed)
Noted. Thanks.

## 2020-03-17 NOTE — Telephone Encounter (Signed)
Pt called and was seen on 03/09/20 and pt said dx with pneumonia; pt finished abx on 03/16/20. Pt said he feels the same as when seen on 03/09/20 the dry cough might be slightly better but not really improved. SOB with exertion the same as 03/09/20.the only difference pt has noticed is since 03/09/20 on and off pt has sharp lt sided CP that only last about 30 seconds; last time had CP was last night. Pt already has appt with Dr Silvio Pate on 03/21/20; but pt said would like to see different provider on 03/18/20. Scheduled 30' in office appt with Dr Damita Dunnings; pt had covid test 2 months ago no other covid symptoms except the cough and SOB. Pt had Moderna vaccine on 07/18/19 and 08/15/19. Dr Damita Dunnings said to schedule appt with him on 03/18/20 at 38 AM and have pt get rapid covid test done today and pt to cb with results; if CP again pt should go to ED. Pt voiced understanding.

## 2020-03-18 ENCOUNTER — Ambulatory Visit: Payer: No Typology Code available for payment source | Admitting: Family Medicine

## 2020-03-21 ENCOUNTER — Other Ambulatory Visit: Payer: Self-pay

## 2020-03-21 ENCOUNTER — Ambulatory Visit (INDEPENDENT_AMBULATORY_CARE_PROVIDER_SITE_OTHER): Payer: No Typology Code available for payment source | Admitting: Internal Medicine

## 2020-03-21 ENCOUNTER — Encounter: Payer: Self-pay | Admitting: Internal Medicine

## 2020-03-21 DIAGNOSIS — I5022 Chronic systolic (congestive) heart failure: Secondary | ICD-10-CM | POA: Diagnosis not present

## 2020-03-21 DIAGNOSIS — J849 Interstitial pulmonary disease, unspecified: Secondary | ICD-10-CM | POA: Diagnosis not present

## 2020-03-21 DIAGNOSIS — I480 Paroxysmal atrial fibrillation: Secondary | ICD-10-CM | POA: Diagnosis not present

## 2020-03-21 NOTE — Assessment & Plan Note (Signed)
CXR finding last time clinically not pneumonia (didn't improve with the antibiotic) Could be his lupus disease--and that might account for his worsening Still on the plaquenil and azathioprine though ?amiodarone toxicity---will try off

## 2020-03-21 NOTE — Assessment & Plan Note (Signed)
EF very low at time of CABG in May--but better later (45%) Weight loss, lack of edema and symptoms are not consistent with active fluid overload Will hold off on diuretics for now

## 2020-03-21 NOTE — Progress Notes (Signed)
   Subjective:    Patient ID: Cody Arellano, male    DOB: 06-14-1945, 74 y.o.   MRN: [id number]  HPI Here with wife for follow up This visit occurred during the SARS-CoV-2 public health emergency.  Safety protocols were in place, including screening questions prior to the visit, additional usage of staff PPE, and extensive cleaning of exam room while observing appropriate contact time as indicated for disinfecting solutions.   Still feels bad---maybe worse then before Still coughing--just rare clear sputum Breathing okay at rest---but very SOB like going up stairs (then recovers quickly)  Tries to sleep in bed---fairly flat. No orthopnea Up to void---but no PND (gets dyspneic with going to [place] though) No chest pain  No palpitations Review of Systems Appetite is fine No fever Back on the amiodarone Weight stable--no weight gain Sleeping quite a bit --per wife    Objective:   Physical Exam Constitutional:      Comments: Some mild wasting  Cardiovascular:     Rate and Rhythm: Normal rate and regular rhythm.     Heart sounds: No murmur heard.  No gallop.   Pulmonary:     Effort: Pulmonary effort is normal.     Breath sounds: No wheezing.     Comments: Slight LLL rhonchi Musculoskeletal:     Right lower leg: No edema.     Left lower leg: No edema.  Neurological:     Mental Status: He is alert.  Psychiatric:        Mood and Affect: Mood normal.        Behavior: Behavior normal.            Assessment & Plan:

## 2020-03-21 NOTE — Assessment & Plan Note (Signed)
Regular now Unclear if he is having side effects with amiodarone (has been worse since on that--but lots of things going on) Will try off the amiodarone Continue the eliquis

## 2020-03-21 NOTE — Patient Instructions (Addendum)
Please stop the amiodarone for now.  You need to see the rheumatologist as soon as possible---I am concerned that your not feeling well is likely related to the lupus. (and the cardiologist)

## 2020-03-22 ENCOUNTER — Other Ambulatory Visit: Payer: Self-pay

## 2020-03-22 ENCOUNTER — Encounter (HOSPITAL_COMMUNITY): Payer: Self-pay | Admitting: Emergency Medicine

## 2020-03-22 ENCOUNTER — Inpatient Hospital Stay (HOSPITAL_COMMUNITY)
Admission: EM | Admit: 2020-03-22 | Discharge: 2020-03-25 | DRG: 175 | Disposition: A | Discharge status: Home / Self Care | Payer: No Typology Code available for payment source | Attending: Family Medicine | Admitting: Family Medicine

## 2020-03-22 DIAGNOSIS — R110 Nausea: Secondary | ICD-10-CM | POA: Diagnosis present

## 2020-03-22 DIAGNOSIS — Z833 Family history of diabetes mellitus: Secondary | ICD-10-CM

## 2020-03-22 DIAGNOSIS — Z811 Family history of alcohol abuse and dependence: Secondary | ICD-10-CM

## 2020-03-22 DIAGNOSIS — I2699 Other pulmonary embolism without acute cor pulmonale: Secondary | ICD-10-CM | POA: Diagnosis not present

## 2020-03-22 DIAGNOSIS — F32A Depression, unspecified: Secondary | ICD-10-CM | POA: Diagnosis present

## 2020-03-22 DIAGNOSIS — Z79899 Other long term (current) drug therapy: Secondary | ICD-10-CM

## 2020-03-22 DIAGNOSIS — E785 Hyperlipidemia, unspecified: Secondary | ICD-10-CM | POA: Diagnosis present

## 2020-03-22 DIAGNOSIS — M329 Systemic lupus erythematosus, unspecified: Secondary | ICD-10-CM | POA: Diagnosis present

## 2020-03-22 DIAGNOSIS — Z801 Family history of malignant neoplasm of trachea, bronchus and lung: Secondary | ICD-10-CM

## 2020-03-22 DIAGNOSIS — Z800 Family history of malignant neoplasm of digestive organs: Secondary | ICD-10-CM

## 2020-03-22 DIAGNOSIS — I248 Other forms of acute ischemic heart disease: Secondary | ICD-10-CM | POA: Diagnosis present

## 2020-03-22 DIAGNOSIS — I5023 Acute on chronic systolic (congestive) heart failure: Secondary | ICD-10-CM | POA: Diagnosis present

## 2020-03-22 DIAGNOSIS — Z8719 Personal history of other diseases of the digestive system: Secondary | ICD-10-CM

## 2020-03-22 DIAGNOSIS — J810 Acute pulmonary edema: Secondary | ICD-10-CM

## 2020-03-22 DIAGNOSIS — Z87891 Personal history of nicotine dependence: Secondary | ICD-10-CM

## 2020-03-22 DIAGNOSIS — G44229 Chronic tension-type headache, not intractable: Secondary | ICD-10-CM | POA: Diagnosis present

## 2020-03-22 DIAGNOSIS — Z83438 Family history of other disorder of lipoprotein metabolism and other lipidemia: Secondary | ICD-10-CM

## 2020-03-22 DIAGNOSIS — Z951 Presence of aortocoronary bypass graft: Secondary | ICD-10-CM

## 2020-03-22 DIAGNOSIS — I252 Old myocardial infarction: Secondary | ICD-10-CM

## 2020-03-22 DIAGNOSIS — Z7982 Long term (current) use of aspirin: Secondary | ICD-10-CM

## 2020-03-22 DIAGNOSIS — Z7901 Long term (current) use of anticoagulants: Secondary | ICD-10-CM

## 2020-03-22 DIAGNOSIS — I110 Hypertensive heart disease with heart failure: Secondary | ICD-10-CM | POA: Diagnosis present

## 2020-03-22 DIAGNOSIS — J849 Interstitial pulmonary disease, unspecified: Secondary | ICD-10-CM | POA: Diagnosis present

## 2020-03-22 DIAGNOSIS — I2510 Atherosclerotic heart disease of native coronary artery without angina pectoris: Secondary | ICD-10-CM | POA: Diagnosis present

## 2020-03-22 DIAGNOSIS — Z681 Body mass index (BMI) 19 or less, adult: Secondary | ICD-10-CM

## 2020-03-22 DIAGNOSIS — I480 Paroxysmal atrial fibrillation: Secondary | ICD-10-CM | POA: Diagnosis present

## 2020-03-22 DIAGNOSIS — F419 Anxiety disorder, unspecified: Secondary | ICD-10-CM | POA: Diagnosis present

## 2020-03-22 DIAGNOSIS — E43 Unspecified severe protein-calorie malnutrition: Secondary | ICD-10-CM | POA: Diagnosis present

## 2020-03-22 DIAGNOSIS — Z20822 Contact with and (suspected) exposure to covid-19: Secondary | ICD-10-CM | POA: Diagnosis present

## 2020-03-22 DIAGNOSIS — Z955 Presence of coronary angioplasty implant and graft: Secondary | ICD-10-CM

## 2020-03-22 DIAGNOSIS — Z8249 Family history of ischemic heart disease and other diseases of the circulatory system: Secondary | ICD-10-CM

## 2020-03-22 LAB — CBC
HCT: 39.5 % (ref 39.0–52.0)
Hemoglobin: 12 g/dL — ABNORMAL LOW (ref 13.0–17.0)
MCH: 28.4 pg (ref 26.0–34.0)
MCHC: 30.4 g/dL (ref 30.0–36.0)
MCV: 93.4 fL (ref 80.0–100.0)
Platelets: 186 10*3/uL (ref 150–400)
RBC: 4.23 MIL/uL (ref 4.22–5.81)
RDW: 18.4 % — ABNORMAL HIGH (ref 11.5–15.5)
WBC: 4.5 10*3/uL (ref 4.0–10.5)
nRBC: 0 % (ref 0.0–0.2)

## 2020-03-22 LAB — URINALYSIS, ROUTINE W REFLEX MICROSCOPIC
Bilirubin Urine: NEGATIVE
Glucose, UA: NEGATIVE mg/dL
Hgb urine dipstick: NEGATIVE
Ketones, ur: 5 mg/dL — AB
Leukocytes,Ua: NEGATIVE
Nitrite: NEGATIVE
Protein, ur: 100 mg/dL — AB
Specific Gravity, Urine: 1.028 (ref 1.005–1.030)
pH: 5 (ref 5.0–8.0)

## 2020-03-22 LAB — COMPREHENSIVE METABOLIC PANEL
ALT: 14 U/L (ref 0–44)
AST: 21 U/L (ref 15–41)
Albumin: 3.1 g/dL — ABNORMAL LOW (ref 3.5–5.0)
Alkaline Phosphatase: 48 U/L (ref 38–126)
Anion gap: 10 (ref 5–15)
BUN: 15 mg/dL (ref 8–23)
CO2: 22 mmol/L (ref 22–32)
Calcium: 8.6 mg/dL — ABNORMAL LOW (ref 8.9–10.3)
Chloride: 102 mmol/L (ref 98–111)
Creatinine, Ser: 1.1 mg/dL (ref 0.61–1.24)
GFR, Estimated: >60 mL/min (ref >60)
Glucose, Bld: 100 mg/dL — ABNORMAL HIGH (ref 70–99)
Potassium: 4.2 mmol/L (ref 3.5–5.1)
Sodium: 134 mmol/L — ABNORMAL LOW (ref 135–145)
Total Bilirubin: 0.7 mg/dL (ref 0.3–1.2)
Total Protein: 6.4 g/dL — ABNORMAL LOW (ref 6.5–8.1)

## 2020-03-22 LAB — RESPIRATORY PANEL BY RT PCR (FLU A&B, COVID)
Influenza A by PCR: NEGATIVE
Influenza B by PCR: NEGATIVE
SARS Coronavirus 2 by RT PCR: NEGATIVE

## 2020-03-22 LAB — LIPASE, BLOOD: Lipase: 22 U/L (ref 11–51)

## 2020-03-22 NOTE — ED Triage Notes (Signed)
Pt reports not feeling well "for a week", symptoms include nausea, not eating, chills, SOB and general weakness.  Pt denies chest pain.

## 2020-03-23 ENCOUNTER — Observation Stay (HOSPITAL_COMMUNITY): Payer: No Typology Code available for payment source

## 2020-03-23 ENCOUNTER — Emergency Department (HOSPITAL_COMMUNITY): Payer: No Typology Code available for payment source

## 2020-03-23 ENCOUNTER — Encounter (HOSPITAL_COMMUNITY): Payer: Self-pay | Admitting: Emergency Medicine

## 2020-03-23 DIAGNOSIS — I340 Nonrheumatic mitral (valve) insufficiency: Secondary | ICD-10-CM

## 2020-03-23 DIAGNOSIS — M3213 Lung involvement in systemic lupus erythematosus: Secondary | ICD-10-CM

## 2020-03-23 DIAGNOSIS — I351 Nonrheumatic aortic (valve) insufficiency: Secondary | ICD-10-CM | POA: Diagnosis not present

## 2020-03-23 DIAGNOSIS — I2510 Atherosclerotic heart disease of native coronary artery without angina pectoris: Secondary | ICD-10-CM

## 2020-03-23 DIAGNOSIS — I361 Nonrheumatic tricuspid (valve) insufficiency: Secondary | ICD-10-CM | POA: Diagnosis not present

## 2020-03-23 DIAGNOSIS — I2699 Other pulmonary embolism without acute cor pulmonale: Secondary | ICD-10-CM

## 2020-03-23 DIAGNOSIS — I480 Paroxysmal atrial fibrillation: Secondary | ICD-10-CM

## 2020-03-23 LAB — CBC
HCT: 41.5 % (ref 39.0–52.0)
Hemoglobin: 12.3 g/dL — ABNORMAL LOW (ref 13.0–17.0)
MCH: 27.9 pg (ref 26.0–34.0)
MCHC: 29.6 g/dL — ABNORMAL LOW (ref 30.0–36.0)
MCV: 94.1 fL (ref 80.0–100.0)
Platelets: 177 10*3/uL (ref 150–400)
RBC: 4.41 MIL/uL (ref 4.22–5.81)
RDW: 18.5 % — ABNORMAL HIGH (ref 11.5–15.5)
WBC: 4.2 10*3/uL (ref 4.0–10.5)
nRBC: 0 % (ref 0.0–0.2)

## 2020-03-23 LAB — TROPONIN I (HIGH SENSITIVITY)
Troponin I (High Sensitivity): 26 ng/L — ABNORMAL HIGH (ref <18)
Troponin I (High Sensitivity): 27 ng/L — ABNORMAL HIGH (ref <18)

## 2020-03-23 LAB — ECHOCARDIOGRAM COMPLETE
AR max vel: 1.85 cm2
AV Area VTI: 1.81 cm2
AV Area mean vel: 1.72 cm2
AV Mean grad: 4 mmHg
AV Peak grad: 7.7 mmHg
Ao pk vel: 1.39 m/s
Area-P 1/2: 5.54 cm2
Height: 72 in
MV M vel: 4.41 m/s
MV Peak grad: 77.8 mmHg
P 1/2 time: 701 msec
Radius: 0.6 cm
S' Lateral: 4 cm
Weight: 2480 oz

## 2020-03-23 LAB — BASIC METABOLIC PANEL
Anion gap: 11 (ref 5–15)
BUN: 13 mg/dL (ref 8–23)
CO2: 22 mmol/L (ref 22–32)
Calcium: 8.9 mg/dL (ref 8.9–10.3)
Chloride: 103 mmol/L (ref 98–111)
Creatinine, Ser: 0.99 mg/dL (ref 0.61–1.24)
GFR, Estimated: >60 mL/min (ref >60)
Glucose, Bld: 74 mg/dL (ref 70–99)
Potassium: 4.3 mmol/L (ref 3.5–5.1)
Sodium: 136 mmol/L (ref 135–145)

## 2020-03-23 LAB — BRAIN NATRIURETIC PEPTIDE: B Natriuretic Peptide: 1543.3 pg/mL — ABNORMAL HIGH (ref 0.0–100.0)

## 2020-03-23 LAB — HEPARIN LEVEL (UNFRACTIONATED): Heparin Unfractionated: 1.86 IU/mL — ABNORMAL HIGH (ref 0.30–0.70)

## 2020-03-23 LAB — APTT
aPTT: 53 seconds — ABNORMAL HIGH (ref 24–36)
aPTT: 76 seconds — ABNORMAL HIGH (ref 24–36)

## 2020-03-23 LAB — MAGNESIUM: Magnesium: 2.2 mg/dL (ref 1.7–2.4)

## 2020-03-23 MED ORDER — ENSURE ENLIVE PO LIQD
237.0000 mL | Freq: Two times a day (BID) | ORAL | Status: DC
  Administered 2020-03-24 – 2020-03-25 (×3): 237 mL via ORAL

## 2020-03-23 MED ORDER — ONDANSETRON HCL 4 MG PO TABS
4.0000 mg | ORAL_TABLET | Freq: Four times a day (QID) | ORAL | Status: DC | PRN
  Administered 2020-03-23 – 2020-03-24 (×2): 4 mg via ORAL
  Filled 2020-03-23 (×2): qty 1

## 2020-03-23 MED ORDER — HEPARIN (PORCINE) 25000 UT/250ML-% IV SOLN: 850.0000 [IU]/h | INTRAVENOUS | Status: DC

## 2020-03-23 MED ORDER — ASPIRIN 81 MG PO CHEW
81.0000 mg | CHEWABLE_TABLET | Freq: Every day | ORAL | Status: DC
  Administered 2020-03-23: 81 mg via ORAL
  Filled 2020-03-23: qty 1

## 2020-03-23 MED ORDER — ONDANSETRON HCL 4 MG/2ML IJ SOLN: 4.0000 mg | Freq: Four times a day (QID) | INTRAMUSCULAR | Status: DC | PRN

## 2020-03-23 MED ORDER — SODIUM CHLORIDE 0.9% FLUSH
3.0000 mL | INTRAVENOUS | Status: DC | PRN
  Administered 2020-03-24: 3 mL via INTRAVENOUS

## 2020-03-23 MED ORDER — HEPARIN (PORCINE) 25000 UT/250ML-% IV SOLN
1350.0000 [IU]/h | INTRAVENOUS | Status: DC
  Administered 2020-03-23: 1100 [IU]/h via INTRAVENOUS
  Administered 2020-03-24: 1350 [IU]/h via INTRAVENOUS
  Filled 2020-03-23 (×3): qty 250

## 2020-03-23 MED ORDER — AZATHIOPRINE 50 MG PO TABS
100.0000 mg | ORAL_TABLET | Freq: Two times a day (BID) | ORAL | Status: DC
  Administered 2020-03-23 – 2020-03-25 (×5): 100 mg via ORAL
  Filled 2020-03-23 (×7): qty 2

## 2020-03-23 MED ORDER — SODIUM CHLORIDE 0.9% FLUSH
3.0000 mL | Freq: Two times a day (BID) | INTRAVENOUS | Status: DC
  Administered 2020-03-24 – 2020-03-25 (×2): 3 mL via INTRAVENOUS

## 2020-03-23 MED ORDER — ACETAMINOPHEN 325 MG PO TABS: 650.0000 mg | ORAL_TABLET | Freq: Four times a day (QID) | ORAL | Status: DC | PRN

## 2020-03-23 MED ORDER — SODIUM CHLORIDE 0.9% FLUSH
3.0000 mL | Freq: Two times a day (BID) | INTRAVENOUS | Status: DC
  Administered 2020-03-24: 3 mL via INTRAVENOUS

## 2020-03-23 MED ORDER — HYDROXYCHLOROQUINE SULFATE 200 MG PO TABS
400.0000 mg | ORAL_TABLET | Freq: Every day | ORAL | Status: DC
  Administered 2020-03-23 – 2020-03-24 (×2): 400 mg via ORAL
  Filled 2020-03-23 (×4): qty 2

## 2020-03-23 MED ORDER — POLYETHYLENE GLYCOL 3350 17 G PO PACK
17.0000 g | PACK | Freq: Every day | ORAL | Status: DC | PRN
  Administered 2020-03-24: 17 g via ORAL
  Filled 2020-03-23: qty 1

## 2020-03-23 MED ORDER — ACETAMINOPHEN 650 MG RE SUPP: 650.0000 mg | Freq: Four times a day (QID) | RECTAL | Status: DC | PRN

## 2020-03-23 MED ORDER — HYDROCODONE-ACETAMINOPHEN 5-325 MG PO TABS: 1.0000 | ORAL_TABLET | Freq: Four times a day (QID) | ORAL | Status: DC | PRN

## 2020-03-23 MED ORDER — HEPARIN BOLUS VIA INFUSION
1000.0000 [IU] | Freq: Once | INTRAVENOUS | Status: AC
  Administered 2020-03-23: 1000 [IU] via INTRAVENOUS
  Filled 2020-03-23: qty 1000

## 2020-03-23 MED ORDER — IOHEXOL 350 MG/ML SOLN
75.0000 mL | Freq: Once | INTRAVENOUS | Status: AC | PRN
  Administered 2020-03-23: 75 mL via INTRAVENOUS

## 2020-03-23 MED ORDER — HEPARIN BOLUS VIA INFUSION
4000.0000 [IU] | Freq: Once | INTRAVENOUS | Status: DC
  Filled 2020-03-23: qty 4000

## 2020-03-23 MED ORDER — FUROSEMIDE 10 MG/ML IJ SOLN
40.0000 mg | Freq: Once | INTRAMUSCULAR | Status: AC
  Administered 2020-03-23: 40 mg via INTRAVENOUS
  Filled 2020-03-23: qty 4

## 2020-03-23 MED ORDER — FUROSEMIDE 10 MG/ML IJ SOLN
40.0000 mg | Freq: Two times a day (BID) | INTRAMUSCULAR | Status: DC
  Administered 2020-03-24: 40 mg via INTRAVENOUS
  Filled 2020-03-23: qty 4

## 2020-03-23 MED ORDER — ROSUVASTATIN CALCIUM 20 MG PO TABS
20.0000 mg | ORAL_TABLET | Freq: Every day | ORAL | Status: DC
  Administered 2020-03-23 – 2020-03-25 (×3): 20 mg via ORAL
  Filled 2020-03-23: qty 1
  Filled 2020-03-23: qty 4
  Filled 2020-03-23: qty 1

## 2020-03-23 MED ORDER — SODIUM CHLORIDE 0.9 % IV SOLN: 250.0000 mL | INTRAVENOUS | Status: DC | PRN

## 2020-03-23 MED ORDER — FUROSEMIDE 10 MG/ML IJ SOLN
40.0000 mg | Freq: Two times a day (BID) | INTRAMUSCULAR | Status: DC
  Administered 2020-03-23: 40 mg via INTRAVENOUS
  Filled 2020-03-23: qty 4

## 2020-03-23 NOTE — H&P (Signed)
History and Physical    Cody Arellano PIR:[id number] DOB: [birth year] DOA: 03/22/2020  PCP: Venia Carbon, MD   Patient coming from: Home   Chief Complaint: SOB, weight loss   HPI: Cody Arellano is a 74 y.o. male with medical history significant for lupus, interstitial lung disease, coronary artery disease, chronic systolic CHF, paroxysmal atrial fibrillation on Eliquis, now presenting to emergency department for evaluation of shortness of breath, weight loss, nausea, and decreased appetite.  Patient also notes some pleuritic pain and occasional cough with clear sputum, but denies any fevers and has not noticed any leg swelling or leg tenderness.  These symptoms began again about 2 weeks ago and have progressively worsened.  He was given a course of Levaquin early in this illness but did not improve.  Amiodarone has been on hold but symptoms continue to worsen.  Patient reports that he ran out of Eliquis about a week ago and went 4 days without the medication prior to obtaining a refill.  He denies any recent prolonged immobilization, history of DVT or PE, is not aware of any cancer, and notes that he had a surgery in August 2021.  ED Course: Upon arrival to the ED, patient is found to be afebrile, saturating well on room air, slightly tachypneic, and with stable blood pressure.  EKG features a sinus rhythm.  Chemistry panel is notable for slight hyponatremia and CBC is unremarkable.  High-sensitivity troponin is slightly elevated.  Covid PCR is negative.  CTA chest is concerning for bilateral lower lobe PE without evidence for heart strain, as well as cardiac enlargement with reflux of contrast into the hepatic veins, pleural effusions, and interstitial and alveolar edema.  Patient was given 40 mg IV Lasix and started on IV heparin infusion in the ED.  Review of Systems:  All other systems reviewed and apart from HPI, are negative.  Past Medical History:  Diagnosis Date  . Anxiety   . Chronic  tension headache   . Colon polyps   . Coronary artery disease 2008/2009   MI with PCI x 2, then PCI x 1 in 2009  . Depression   . Dyslipidemia   . Dysrhythmia    atrial fibrillation  . Hyperlipidemia   . Hypertension   . Lupus [facility])    sees Dr Trudie Reed  . Lupus disease of the lung   . 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 Myocardial infarction ([place]) 2008  . 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 Shortness of breath     Past Surgical History:  Procedure Laterality Date  . CARDIAC CATHETERIZATION    . CLIPPING OF ATRIAL APPENDAGE N/A 10/17/2019   Procedure: Clipping Of Atrial Appendage using AtriCure [id number] 45 MM AtriClip.;  Surgeon: Rexene Alberts, MD;  Location: [place];  Service: Open Heart Surgery;  Laterality: N/A;  . CORONARY ARTERY BYPASS GRAFT N/A 10/17/2019   Procedure: CORONARY ARTERY BYPASS GRAFTING (CABG) using LIMA to LAD; Endoscopic harvest right greater saphenous vein: SVG to RAMUS.;  Surgeon: Rexene Alberts, MD;  Location: [place];  Service: Open Heart Surgery;  Laterality: N/A;  . CORONARY BALLOON ANGIOPLASTY N/A 10/17/2019   Procedure: CORONARY BALLOON ANGIOPLASTY;  Surgeon: Nelva Bush, MD;  Location: [place] CV LAB;  Service: Cardiovascular;  Laterality: N/A;  . coronary stents  2009  . CORONARY/GRAFT ACUTE MI REVASCULARIZATION N/A 10/17/2019   Procedure: Coronary/Graft Acute MI Revascularization;  Surgeon: Nelva Bush, MD;  Location: [place] CV LAB;  Service: Cardiovascular;  Laterality: N/A;  . ENDOVEIN HARVEST OF GREATER SAPHENOUS VEIN Right 10/17/2019   Procedure: [place] Of Greater Saphenous Vein;  Surgeon: Rexene Alberts, MD;  Location: [place];  Service: Open Heart Surgery;  Laterality: Right;  . IABP INSERTION N/A 10/17/2019   Procedure: IABP Insertion;  Surgeon: Nelva Bush, MD;  Location: [place] CV LAB;  Service: Cardiovascular;  Laterality: N/A;  . INCISION AND DRAINAGE  ABSCESS N/A 01/29/2020   Procedure: INCISION AND DRAINAGE BILATERAL PERIRECTAL ABSCESS;  Surgeon: Stark Klein, MD;  Location: [place];  Service: General;  Laterality: N/A;  . IR THORACENTESIS ASP PLEURAL SPACE W/IMG GUIDE  10/26/2019  . RIGHT/LEFT HEART CATH AND CORONARY ANGIOGRAPHY N/A 10/17/2019   Procedure: RIGHT/LEFT HEART CATH AND CORONARY ANGIOGRAPHY;  Surgeon: Nelva Bush, MD;  Location: [place] [facility] LAB;  Service: Cardiovascular;  Laterality: N/A;  . TEE WITHOUT CARDIOVERSION  10/17/2019   Procedure: Transesophageal Echocardiogram (Tee);  Surgeon: Rexene Alberts, MD;  Location: [facility] OR;  Service: Open Heart Surgery;;    Social History:   reports that he quit smoking about 27 years ago. His smoking use included cigarettes. He has a 20.00 pack-year smoking history. He has never used smokeless tobacco. He reports that he does not drink alcohol and does not use drugs.  No Known Allergies  Family History  Problem Relation Age of Onset  . Heart disease Mother   . Alcohol abuse Father   . Hyperlipidemia Sister   . Hypertension Sister   . Hyperlipidemia Brother   . Hypertension Brother   . Cancer Brother        ?lung cancer  . Stomach cancer Brother   . Diabetes Paternal Uncle   . Colon cancer Neg Hx   . Esophageal cancer Neg Hx   . Rectal cancer Neg Hx      Prior to Admission medications   Medication Sig Start Date End Date Taking? Authorizing Provider  apixaban (ELIQUIS) 5 MG TABS tablet Take 1 tablet (5 mg total) by mouth 2 (two) times daily. 02/02/20   Rai, Vernelle Emerald, MD  aspirin EC 81 MG tablet Take 1 tablet (81 mg total) by mouth daily. 08/10/14   Nahser, Wonda Cheng, MD  azaTHIOprine (IMURAN) 50 MG tablet Take 100 mg by mouth 2 (two) times daily.  10/11/14   [provider]  hydroxychloroquine (PLAQUENIL) 200 MG tablet Take 400 mg by mouth at bedtime.     [provider]  oxyCODONE (OXY IR/ROXICODONE) 5 MG immediate release tablet Take 1 tablet (5 mg total)  by mouth every 6 (six) hours as needed for severe pain. 02/02/20   Rai, Ripudeep K, MD  polyethylene glycol (MIRALAX / GLYCOLAX) 17 g packet Take 17 g by mouth daily.    [provider]  polyvinyl alcohol (ARTIFICIAL TEARS) 1.4 % ophthalmic solution Place 1 drop into both eyes daily.    [provider]  rosuvastatin (CRESTOR) 20 MG tablet Take 20 mg by mouth daily.    [provider]  tamsulosin (FLOMAX) 0.4 MG CAPS capsule Take 0.4 mg by mouth every evening.  04/02/17   [provider]    Physical Exam: Vitals:   03/23/20 0200 03/23/20 0215 03/23/20 0223 03/23/20 0245  BP: 129/86 125/82  120/85  Pulse: 72 68  71  Resp: 19 20  (!) 22  Temp:      TempSrc:  SpO2: 100% 99%  99%  Weight:   70.3 kg   Height:   6' (1.829 m)     Constitutional: NAD, calm  Eyes: PERTLA, lids and conjunctivae normal ENMT: Mucous membranes are moist. Posterior pharynx clear of any exudate or lesions.   Neck: normal, supple, no masses, no thyromegaly Respiratory: Mild tachypnea, no wheezing. No accessory muscle use.  Cardiovascular: S1 & S2 heard, regular rate and rhythm. No extremity edema.  Abdomen: No distension, no tenderness, soft. Bowel sounds active.  Musculoskeletal: no clubbing / cyanosis. No joint deformity upper and lower extremities.   Skin: no significant rashes, lesions, ulcers. Warm, dry, well-perfused. Neurologic: No gross facial asymmetry. Sensation intact. Moving all extremities.  Psychiatric: Alert and oriented to person, place, and situation. Very pleasant and cooperative.    Labs and Imaging on Admission: I have personally reviewed following labs and imaging studies  CBC: Recent Labs  Lab 03/22/20 2011  WBC 4.5  HGB 12.0*  HCT 39.5  MCV 93.4  PLT 725   Basic Metabolic Panel: Recent Labs  Lab 03/22/20 2011  NA 134*  K 4.2  CL 102  CO2 22  GLUCOSE 100*  BUN 15  CREATININE 1.10  CALCIUM 8.6*   GFR: Estimated Creatinine Clearance:  58.6 mL/min (by C-G formula based on SCr of 1.1 mg/dL). Liver Function Tests: Recent Labs  Lab 03/22/20 2011  AST 21  ALT 14  ALKPHOS 48  BILITOT 0.7  PROT 6.4*  ALBUMIN 3.1*   Recent Labs  Lab 03/22/20 2011  LIPASE 22   No results for input(s): AMMONIA in the last 168 hours. Coagulation Profile: No results for input(s): INR, PROTIME in the last 168 hours. Cardiac Enzymes: No results for input(s): CKTOTAL, CKMB, CKMBINDEX, TROPONINI in the last 168 hours. BNP (last 3 results) No results for input(s): PROBNP in the last 8760 hours. HbA1C: No results for input(s): HGBA1C in the last 72 hours. CBG: No results for input(s): GLUCAP in the last 168 hours. Lipid Profile: No results for input(s): CHOL, HDL, LDLCALC, TRIG, CHOLHDL, LDLDIRECT in the last 72 hours. Thyroid Function Tests: No results for input(s): TSH, T4TOTAL, FREET4, T3FREE, THYROIDAB in the last 72 hours. Anemia Panel: No results for input(s): VITAMINB12, FOLATE, FERRITIN, TIBC, IRON, RETICCTPCT in the last 72 hours. Urine analysis:    Component Value Date/Time   COLORURINE AMBER (A) 03/22/2020 1945   APPEARANCEUR HAZY (A) 03/22/2020 1945   LABSPEC 1.028 03/22/2020 1945   PHURINE 5.0 03/22/2020 1945   GLUCOSEU NEGATIVE 03/22/2020 1945   HGBUR NEGATIVE 03/22/2020 1945   BILIRUBINUR NEGATIVE 03/22/2020 1945   KETONESUR 5 (A) 03/22/2020 1945   PROTEINUR 100 (A) 03/22/2020 1945   UROBILINOGEN 2.0 (H) 02/04/2013 0200   NITRITE NEGATIVE 03/22/2020 1945   LEUKOCYTESUR NEGATIVE 03/22/2020 1945   Sepsis Labs: @LABRCNTIP (procalcitonin:4,lacticidven:4) ) Recent Results (from the past 240 hour(s))  Respiratory Panel by RT PCR (Flu A&B, Covid) - Nasopharyngeal Swab     Status: None   Collection Time: 03/22/20  7:46 PM   Specimen: Nasopharyngeal Swab  Result Value Ref Range Status   SARS Coronavirus 2 by RT PCR NEGATIVE NEGATIVE Final    Comment: (NOTE) SARS-CoV-2 target nucleic acids are NOT DETECTED.  The  SARS-CoV-2 RNA is generally detectable in upper respiratoy specimens during the acute phase of infection. The lowest concentration of SARS-CoV-2 viral copies this assay can detect is 131 copies/mL. A negative result does not preclude SARS-Cov-2 infection and should not be used as the sole basis  for treatment or other patient management decisions. A negative result may occur with  improper specimen collection/handling, submission of specimen other than nasopharyngeal swab, presence of viral mutation(s) within the areas targeted by this assay, and inadequate number of viral copies (<131 copies/mL). A negative result must be combined with clinical observations, patient history, and epidemiological information. The expected result is Negative.  Fact Sheet for Patients:  PinkCheek.be  Fact Sheet for Healthcare Providers:  GravelBags.it  This test is no t yet approved or cleared by the [place] FDA and  has been authorized for detection and/or diagnosis of SARS-CoV-2 by FDA under an Emergency Use Authorization (EUA). This EUA will remain  in effect (meaning this test can be used) for the duration of the COVID-19 declaration under Section 564(b)(1) of the Act, 21 U.S.C. section 360bbb-3(b)(1), unless the authorization is terminated or revoked sooner.     Influenza A by PCR NEGATIVE NEGATIVE Final   Influenza B by PCR NEGATIVE NEGATIVE Final    Comment: (NOTE) The Xpert Xpress SARS-CoV-2/FLU/RSV assay is intended as an aid in  the diagnosis of influenza from Nasopharyngeal swab specimens and  should not be used as a sole basis for treatment. Nasal washings and  aspirates are unacceptable for Xpert Xpress SARS-CoV-2/FLU/RSV  testing.  Fact Sheet for Patients: PinkCheek.be  Fact Sheet for Healthcare Providers: GravelBags.it  This test is not yet approved or cleared  by the [place] FDA and  has been authorized for detection and/or diagnosis of SARS-CoV-2 by  FDA under an Emergency Use Authorization (EUA). This EUA will remain  in effect (meaning this test can be used) for the duration of the  Covid-19 declaration under Section 564(b)(1) of the Act, 21  U.S.C. section 360bbb-3(b)(1), unless the authorization is  terminated or revoked. Performed at [facility], [place] [address] [place], [place] [address]      Radiological Exams on Admission: CT Angio Chest PE W and/or Wo Contrast  Result Date: 03/23/2020 CLINICAL DATA:  Chest pain and shortness of breath. Nausea, lack of appetite, chills, and weakness for 1 week. EXAM: CT ANGIOGRAPHY CHEST WITH CONTRAST TECHNIQUE: Multidetector CT imaging of the chest was performed using the standard protocol during bolus administration of intravenous contrast. Multiplanar CT image reconstructions and MIPs were obtained to evaluate the vascular anatomy. CONTRAST:  72mL OMNIPAQUE IOHEXOL 350 MG/ML SOLN COMPARISON:  11/12/2019 FINDINGS: Cardiovascular: Good opacification of the central and segmental pulmonary arteries. Filling defects are identified in bilateral lower lobe pulmonary arteries consistent with pulmonary embolus. This is new since the previous study. Diffuse cardiac enlargement. Reflux of contrast material into the hepatic veins may indicate right heart failure. RV to LV ratio is about 0.88, suggesting right heart strain is unlikely. No pericardial effusions. Postoperative changes in the mediastinum. Normal caliber of the thoracic aorta with scattered calcifications. Mediastinum/Nodes: Mediastinal lymph nodes are not pathologically enlarged. Esophagus is decompressed. Lungs/Pleura: Motion artifact limits examination. Moderate right and small left pleural effusions. Interstitial and alveolar perihilar infiltrates, likely edema. Bronchiectasis and emphysematous changes in the lungs. Upper Abdomen: Small amount  of ascites around the liver. Musculoskeletal: Thoracic scoliosis convex towards the right. No destructive bone lesions. Review of the MIP images confirms the above findings. IMPRESSION: 1. Bilateral lower lobe pulmonary emboli. A normal RV to LV ratio argues against right heart strain. 2. Diffuse cardiac enlargement with reflux of contrast material into the hepatic veins suggesting right heart failure. 3. Moderate right and small left pleural effusions with interstitial and  alveolar perihilar infiltrates, likely edema. 4. Bronchiectasis and emphysematous changes in the lungs. 5. Small amount of ascites around the liver. 6. Emphysema and aortic atherosclerosis. Aortic Atherosclerosis (ICD10-I70.0) and Emphysema (ICD10-J43.9). Critical Value/emergent results were called by telephone at the time of interpretation on 03/23/2020 at 3:15 am to provider [facility] , who verbally acknowledged these results. Electronically Signed   By: Lucienne Capers M.D.   On: 03/23/2020 03:19   DG Chest Portable 1 View  Result Date: 03/23/2020 CLINICAL DATA:  Nausea, not eating, chills, shortness of breath, and generalized weakness. EXAM: PORTABLE CHEST 1 VIEW COMPARISON:  03/09/2020 FINDINGS: Postoperative changes in the mediastinum. Diffuse cardiac enlargement. Pulmonary vascular congestion. Increasing interstitial and alveolar infiltrates in the lungs suggesting progressing edema or possibly developing pneumonia. Small bilateral pleural effusions. No pneumothorax. Calcification of the aorta. Thoracic scoliosis convex towards the right. IMPRESSION: Cardiac enlargement with pulmonary vascular congestion and increasing bilateral interstitial and alveolar infiltrates suggesting progressing edema or developing pneumonia. Electronically Signed   By: Lucienne Capers M.D.   On: 03/23/2020 02:50    EKG: Independently reviewed. Sinus rhythm.   Assessment/Plan   1. Pulmonary embolism  - Presents with SOB and pleuritic pain and  is found to have bilateral PE with normal RV:LV, as well as pleural effusions and pulmonary edema  - He is on Eliquis for PAF but ran out ~1 week ago and went without for ~4 days prior to getting refill; symptoms began prior to this and Eliquis failure suspected though conceivable that initial sxs were secondary to CHF and PE then developed while missing Eliquis doses  - He denies any recent immobilization, last had surgery in August (rectal abscess) - He is not aware of any cancer but complains of unintentional wt loss and this is certainly a concern; he reports being utd on colon cancer screening, had normal PSA in August 2021, and while he is a former smoker no lung nodules noted on CTA in ED  - He is being started on IV heparin in ED, will check BNP, trend troponin, continue anticoagulation    2. Acute on chronic systolic CHF  - Presents with SOB and pleuritic pain and is found to have bilateral PE, bilateral pleural effusions, and pulmonary edema  - EF was 45-50% in May 2021  - He was given Lasix 40 mg IV in ED  - Continue diuresis with Lasix 40 mg IV q12h, monitor weight and I/Os, monitor renal function and electrolytes   3. Paroxysmal atrial fibrillation  - In sinus rhythm on admission  - CHADS-VASc is 53 (age, CHF, CAD)  - Continue IV heparin for now   4. 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 CAD - Presents with SOB and nausea, has some mild pleuritic pain  - HS troponin is 26, likely related to PE rather than ACS  - Second troponin pending  - Continue statin and aspirin    DVT prophylaxis: IV heparin  Code Status: Full  Family Communication: Wife updated by phone  Disposition Plan:  Patient is from: Home  Anticipated d/c is to: TBD Anticipated d/c date is: 03/24/20 Patient currently: Pending improvement in dyspnea with anticoagulation and diuresis  Consults called: None  Admission status: Observation      Vianne Bulls, MD [facility]  03/23/2020, 3:53 AM

## 2020-03-23 NOTE — ED Notes (Signed)
Ordered breakfast 

## 2020-03-23 NOTE — ED Notes (Signed)
Patient transported to CT 

## 2020-03-23 NOTE — Progress Notes (Signed)
[place] for heparin Indication: pulmonary embolus  No Known Allergies  Patient Measurements: Height: 6' (182.9 cm) Weight: 70.3 kg (155 lb) IBW/kg (Calculated) : 77.6  Vital Signs: BP: 112/68 (10/20 1030) Pulse Rate: 71 (10/20 1030)  Labs: Recent Labs    03/22/20 2011 03/23/20 0158 03/23/20 0418 03/23/20 1308  HGB 12.0*  --  12.3*  --   HCT 39.5  --  41.5  --   PLT 186  --  177  --   APTT  --   --   --  53*  CREATININE 1.10  --  0.99  --   TROPONINIHS  --  26* 27*  --     Estimated Creatinine Clearance: 65.1 mL/min (by C-G formula based on SCr of 0.99 mg/dL).  Assessment: 74yo male c/o nausea, anorexia, chills, SOB, and general weakness, troponin mildly elevated but CT reveals bilateral PE. He is on apixaban PTA for history of afib. He states he is compliant but recently ran out and was off of it for a few days. Transitioned to IV heparin. Initial aPTT is below goal at 53. No bleeding noted.  Goal of Therapy:  Heparin level 0.3-0.7 units/ml aPTT 66-102 seconds Monitor platelets by anticoagulation protocol: Yes   Plan:  Increase heparin gtt to 1350 units/hr Check an 8 hr aPTT Daily aPTT, heparin level and CBC  Salome Arnt, PharmD, BCPS Clinical Pharmacist Please see AMION for all pharmacy numbers 03/23/2020 2:04 PM

## 2020-03-23 NOTE — ED Provider Notes (Signed)
[facility] EMERGENCY DEPARTMENT Provider Note   CSN: [id number] Arrival date & time: 03/22/20  1912     History Chief Complaint  Patient presents with  . Nausea  . Shortness of Breath    DEONTRE ALLSUP is a 74 y.o. male.  The history is provided by the patient.  Shortness of Breath Severity:  Severe Onset quality:  Gradual Duration:  2 weeks Timing:  Constant Progression:  Worsening Chronicity:  New Context: not activity   Relieved by:  Nothing Worsened by:  Nothing Ineffective treatments:  None tried Associated symptoms: chest pain   Associated symptoms: no abdominal pain, no fever and no rash   Associated symptoms comment:  Nausea  Risk factors: no recent alcohol use and no hx of PE/DVT   Patient with Lupus who presents 2 weeks of SOB.  Treated for PNA with ongoing symptoms.  No f/c/r.       Past Medical History:  Diagnosis Date  . Anxiety   . Chronic tension headache   . Colon polyps   . Coronary artery disease 2008/2009   MI with PCI x 2, then PCI x 1 in 2009  . Depression   . Dyslipidemia   . Dysrhythmia    atrial fibrillation  . Hyperlipidemia   . Hypertension   . Lupus [facility])    sees Dr Trudie Reed  . Lupus disease of the lung   . 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 Myocardial infarction ([place]) 2008  . 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 Shortness of breath     Patient Active Problem List   Diagnosis Date Noted  . Heart failure, systolic, chronic ([place]) 03/28/8526  . Fatigue 03/09/2020  . Atherosclerotic heart disease of native coronary artery with angina pectoris ([place]) 03/09/2020  . Malnutrition of mild degree ([place]) 03/09/2020  . Hypotension 01/29/2020  . Anemia 01/29/2020  . Prostatic hypertrophy 01/29/2020  . Pancreatic lesion 01/29/2020  . Rectal abscess 01/28/2020  . 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 Paroxysmal atrial fibrillation ([place]) 01/20/2019  . Chronic tension  headache   . Preventative health care 01/09/2018  . Memory loss 01/09/2018  . Advance directive discussed with patient 01/09/2018  . Hyperlipemia 01/17/2016  . 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 Personal history of colonic polyps 05/20/2013  . Episodic mood disorder ([place]) 05/13/2013  . Systemic lupus erythematosus ([place]) 05/13/2013  . Colon polyps   . CAD (coronary artery disease) 04/09/2013  . 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 ILD (interstitial lung disease) ([place]) 02/04/2013    Past Surgical History:  Procedure Laterality Date  . CARDIAC CATHETERIZATION    . CLIPPING OF ATRIAL APPENDAGE N/A 10/17/2019   Procedure: Clipping Of Atrial Appendage using AtriCure [id number] 45 MM AtriClip.;  Surgeon: Rexene Alberts, MD;  Location: [place];  Service: Open Heart Surgery;  Laterality: N/A;  . CORONARY ARTERY BYPASS GRAFT N/A 10/17/2019   Procedure: CORONARY ARTERY BYPASS GRAFTING (CABG) using LIMA to LAD; Endoscopic harvest right greater saphenous vein: SVG to RAMUS.;  Surgeon: Rexene Alberts, MD;  Location: [place];  Service: Open Heart Surgery;  Laterality: N/A;  . CORONARY BALLOON ANGIOPLASTY N/A 10/17/2019   Procedure: CORONARY BALLOON ANGIOPLASTY;  Surgeon: Nelva Bush, MD;  Location: [place] CV LAB;  Service: Cardiovascular;  Laterality: N/A;  . coronary stents  2009  .  CORONARY/GRAFT ACUTE MI REVASCULARIZATION N/A 10/17/2019   Procedure: Coronary/Graft Acute MI Revascularization;  Surgeon: Nelva Bush, MD;  Location: [place] CV LAB;  Service: Cardiovascular;  Laterality: N/A;  . ENDOVEIN HARVEST OF GREATER SAPHENOUS VEIN Right 10/17/2019   Procedure: [place] Of Greater Saphenous Vein;  Surgeon: Rexene Alberts, MD;  Location: [place];  Service: Open Heart Surgery;  Laterality: Right;  . IABP INSERTION N/A 10/17/2019   Procedure: IABP Insertion;  Surgeon: Nelva Bush, MD;  Location: [place]  CV LAB;  Service: Cardiovascular;  Laterality: N/A;  . INCISION AND DRAINAGE ABSCESS N/A 01/29/2020   Procedure: INCISION AND DRAINAGE BILATERAL PERIRECTAL ABSCESS;  Surgeon: Stark Klein, MD;  Location: [place];  Service: General;  Laterality: N/A;  . IR THORACENTESIS ASP PLEURAL SPACE W/IMG GUIDE  10/26/2019  . RIGHT/LEFT HEART CATH AND CORONARY ANGIOGRAPHY N/A 10/17/2019   Procedure: RIGHT/LEFT HEART CATH AND CORONARY ANGIOGRAPHY;  Surgeon: Nelva Bush, MD;  Location: [place] CV LAB;  Service: Cardiovascular;  Laterality: N/A;  . TEE WITHOUT CARDIOVERSION  10/17/2019   Procedure: Transesophageal Echocardiogram (Tee);  Surgeon: Rexene Alberts, MD;  Location[facility];  Service: Open Heart Surgery;;       Family History  Problem Relation Age of Onset  . Heart disease Mother   . Alcohol abuse Father   . Hyperlipidemia Sister   . Hypertension Sister   . Hyperlipidemia Brother   . Hypertension Brother   . Cancer Brother        ?lung cancer  . Stomach cancer Brother   . Diabetes Paternal Uncle   . Colon cancer Neg Hx   . Esophageal cancer Neg Hx   . Rectal cancer Neg Hx     Social History   Tobacco Use  . Smoking status: Former Smoker    Packs/day: 1.00    Years: 20.00    Pack years: 20.00    Types: Cigarettes    Quit date: 02/26/1993    Years since quitting: 27.0  . Smokeless tobacco: Never Used  . Tobacco comment: QUIT SMOKING 20 YEARS AGO  Vaping Use  . Vaping Use: Never used  Substance Use Topics  . Alcohol use: No  . Drug use: No    Home Medications Prior to Admission medications   Medication Sig Start Date End Date Taking? Authorizing Provider  apixaban (ELIQUIS) 5 MG TABS tablet Take 1 tablet (5 mg total) by mouth 2 (two) times daily. 02/02/20   Rai, Vernelle Emerald, MD  aspirin EC 81 MG tablet Take 1 tablet (81 mg total) by mouth daily. 08/10/14   Nahser, Wonda Cheng, MD  azaTHIOprine (IMURAN) 50 MG tablet Take 100 mg by mouth 2 (two) times daily.  10/11/14   [provider]  hydroxychloroquine (PLAQUENIL) 200 MG tablet Take 400 mg by mouth at bedtime.     [provider]  oxyCODONE (OXY IR/ROXICODONE) 5 MG immediate release tablet Take 1 tablet (5 mg total) by mouth every 6 (six) hours as needed for severe pain. 02/02/20   Rai, Ripudeep K, MD  polyethylene glycol (MIRALAX / GLYCOLAX) 17 g packet Take 17 g by mouth daily.    [provider]  polyvinyl alcohol (ARTIFICIAL TEARS) 1.4 % ophthalmic solution Place 1 drop into both eyes daily.    [provider]  rosuvastatin (CRESTOR) 20 MG tablet Take 20 mg by mouth daily.    [provider]  tamsulosin (FLOMAX) 0.4 MG CAPS capsule Take 0.4 mg by mouth every  evening.  04/02/17   [provider]    Allergies    Patient has no known allergies.  Review of Systems   Review of Systems  Constitutional: Negative for fever.  HENT: Negative for congestion.   Eyes: Negative for visual disturbance.  Respiratory: Positive for shortness of breath.   Cardiovascular: Positive for chest pain.  Gastrointestinal: Negative for abdominal pain.  Genitourinary: Negative for difficulty urinating.  Musculoskeletal: Negative for arthralgias.  Skin: Negative for rash.  Neurological: Negative for dizziness.  Psychiatric/Behavioral: Negative for agitation.  All other systems reviewed and are negative.   Physical Exam Updated Vital Signs BP 120/85   Pulse 71   Temp 98.2 F (36.8 C) (Oral)   Resp (!) 22   Ht 6' (1.829 m)   Wt 70.3 kg   SpO2 99%   BMI 21.02 kg/m   Physical Exam Vitals and nursing note reviewed.  Constitutional:      General: He is not in acute distress.    Appearance: Normal appearance.  HENT:     Head: Normocephalic and atraumatic.  Eyes:     Conjunctiva/sclera: Conjunctivae normal.     Pupils: Pupils are equal, round, and reactive to light.  Cardiovascular:     Rate and Rhythm: Normal rate and regular rhythm.     Pulses: Normal pulses.      Heart sounds: Normal heart sounds.  Pulmonary:     Effort: Pulmonary effort is normal.     Breath sounds: Normal breath sounds.  Abdominal:     General: Abdomen is flat. Bowel sounds are normal.     Palpations: Abdomen is soft.     Tenderness: There is no abdominal tenderness. There is no guarding or rebound.  Musculoskeletal:        General: Normal range of motion.     Cervical back: Normal range of motion and neck supple.  Skin:    General: Skin is warm and dry.     Capillary Refill: Capillary refill takes less than 2 seconds.  Neurological:     General: No focal deficit present.     Mental Status: He is alert and oriented to person, place, and time.     Deep Tendon Reflexes: Reflexes normal.  Psychiatric:        Mood and Affect: Mood normal.        Behavior: Behavior normal.      ED Results / Procedures / Treatments   Labs (all labs ordered are listed, but only abnormal results are displayed) Results for orders placed or performed during the hospital encounter of 03/22/20  Respiratory Panel by RT PCR (Flu A&B, Covid) - Nasopharyngeal Swab   Specimen: Nasopharyngeal Swab  Result Value Ref Range   SARS Coronavirus 2 by RT PCR NEGATIVE NEGATIVE   Influenza A by PCR NEGATIVE NEGATIVE   Influenza B by PCR NEGATIVE NEGATIVE  Lipase, blood  Result Value Ref Range   Lipase 22 11 - 51 U/L  Comprehensive metabolic panel  Result Value Ref Range   Sodium 134 (L) 135 - 145 mmol/L   Potassium 4.2 3.5 - 5.1 mmol/L   Chloride 102 98 - 111 mmol/L   CO2 22 22 - 32 mmol/L   Glucose, Bld 100 (H) 70 - 99 mg/dL   BUN 15 8 - 23 mg/dL   Creatinine, Ser 1.10 0.61 - 1.24 mg/dL   Calcium 8.6 (L) 8.9 - 10.3 mg/dL   Total Protein 6.4 (L) 6.5 - 8.1 g/dL   Albumin 3.1 (  L) 3.5 - 5.0 g/dL   AST 21 15 - 41 U/L   ALT 14 0 - 44 U/L   Alkaline Phosphatase 48 38 - 126 U/L   Total Bilirubin 0.7 0.3 - 1.2 mg/dL   GFR, Estimated >60 >60 mL/min   Anion gap 10 5 - 15  CBC  Result Value Ref Range    WBC 4.5 4.0 - 10.5 K/uL   RBC 4.23 4.22 - 5.81 MIL/uL   Hemoglobin 12.0 (L) 13.0 - 17.0 g/dL   HCT 39.5 39 - 52 %   MCV 93.4 80.0 - 100.0 fL   MCH 28.4 26.0 - 34.0 pg   MCHC 30.4 30.0 - 36.0 g/dL   RDW 18.4 (H) 11.5 - 15.5 %   Platelets 186 150 - 400 K/uL   nRBC 0.0 0.0 - 0.2 %  Urinalysis, Routine w reflex microscopic Urine, Clean Catch  Result Value Ref Range   Color, Urine AMBER (A) YELLOW   APPearance HAZY (A) CLEAR   Specific Gravity, Urine 1.028 1.005 - 1.030   pH 5.0 5.0 - 8.0   Glucose, UA NEGATIVE NEGATIVE mg/dL   Hgb urine dipstick NEGATIVE NEGATIVE   Bilirubin Urine NEGATIVE NEGATIVE   Ketones, ur 5 (A) NEGATIVE mg/dL   Protein, ur 100 (A) NEGATIVE mg/dL   Nitrite NEGATIVE NEGATIVE   Leukocytes,Ua NEGATIVE NEGATIVE   WBC, UA 0-5 0 - 5 WBC/hpf   Bacteria, UA FEW (A) NONE SEEN   Squamous Epithelial / LPF 0-5 0 - 5   Mucus PRESENT   Troponin I (High Sensitivity)  Result Value Ref Range   Troponin I (High Sensitivity) 26 (H) <18 ng/L   DG Chest 2 View  Result Date: 03/09/2020 CLINICAL DATA:  Dyspnea on exertion EXAM: CHEST - 2 VIEW COMPARISON:  11/16/2019, CT 11/13/2019 FINDINGS: Chronic interstitial opacity. Borderline to mild cardiomegaly with central congestion. Development of small pleural effusions and hazy airspace disease at both bases. Aortic atherosclerosis. No pneumothorax. Atrial appendage clip. Scoliosis of the spine. Chronic deformity distal left clavicle. IMPRESSION: Interval borderline to mild cardiomegaly with vascular congestion and small pleural effusions. Hazy edema or infiltrates at the bases. Electronically Signed   By: Donavan Foil M.D.   On: 03/09/2020 22:07   CT Angio Chest PE W and/or Wo Contrast  Result Date: 03/23/2020 CLINICAL DATA:  Chest pain and shortness of breath. Nausea, lack of appetite, chills, and weakness for 1 week. EXAM: CT ANGIOGRAPHY CHEST WITH CONTRAST TECHNIQUE: Multidetector CT imaging of the chest was performed using the  standard protocol during bolus administration of intravenous contrast. Multiplanar CT image reconstructions and MIPs were obtained to evaluate the vascular anatomy. CONTRAST:  62mL OMNIPAQUE IOHEXOL 350 MG/ML SOLN COMPARISON:  11/12/2019 FINDINGS: Cardiovascular: Good opacification of the central and segmental pulmonary arteries. Filling defects are identified in bilateral lower lobe pulmonary arteries consistent with pulmonary embolus. This is new since the previous study. Diffuse cardiac enlargement. Reflux of contrast material into the hepatic veins may indicate right heart failure. RV to LV ratio is about 0.88, suggesting right heart strain is unlikely. No pericardial effusions. Postoperative changes in the mediastinum. Normal caliber of the thoracic aorta with scattered calcifications. Mediastinum/Nodes: Mediastinal lymph nodes are not pathologically enlarged. Esophagus is decompressed. Lungs/Pleura: Motion artifact limits examination. Moderate right and small left pleural effusions. Interstitial and alveolar perihilar infiltrates, likely edema. Bronchiectasis and emphysematous changes in the lungs. Upper Abdomen: Small amount of ascites around the liver. Musculoskeletal: Thoracic scoliosis convex towards the right. No destructive  bone lesions. Review of the MIP images confirms the above findings. IMPRESSION: 1. Bilateral lower lobe pulmonary emboli. A normal RV to LV ratio argues against right heart strain. 2. Diffuse cardiac enlargement with reflux of contrast material into the hepatic veins suggesting right heart failure. 3. Moderate right and small left pleural effusions with interstitial and alveolar perihilar infiltrates, likely edema. 4. Bronchiectasis and emphysematous changes in the lungs. 5. Small amount of ascites around the liver. 6. Emphysema and aortic atherosclerosis. Aortic Atherosclerosis (ICD10-I70.0) and Emphysema (ICD10-J43.9). Critical Value/emergent results were called by telephone at the  time of interpretation on 03/23/2020 at 3:15 am to provider [facility] , who verbally acknowledged these results. Electronically Signed   By: Lucienne Capers M.D.   On: 03/23/2020 03:19   DG Chest Portable 1 View  Result Date: 03/23/2020 CLINICAL DATA:  Nausea, not eating, chills, shortness of breath, and generalized weakness. EXAM: PORTABLE CHEST 1 VIEW COMPARISON:  03/09/2020 FINDINGS: Postoperative changes in the mediastinum. Diffuse cardiac enlargement. Pulmonary vascular congestion. Increasing interstitial and alveolar infiltrates in the lungs suggesting progressing edema or possibly developing pneumonia. Small bilateral pleural effusions. No pneumothorax. Calcification of the aorta. Thoracic scoliosis convex towards the right. IMPRESSION: Cardiac enlargement with pulmonary vascular congestion and increasing bilateral interstitial and alveolar infiltrates suggesting progressing edema or developing pneumonia. Electronically Signed   By: Lucienne Capers M.D.   On: 03/23/2020 02:50    EKG EKG Interpretation  Date/Time:  Tuesday March 22 2020 19:47:41 EDT Ventricular Rate:  72 PR Interval:  190 QRS Duration: 84 QT Interval:  350 QTC Calculation: 383 R Axis:   -24 Text Interpretation: Normal sinus rhythm Possible Left atrial enlargement Anterior infarct , age undetermined Confirmed by Randal Buba, Delorse Shane ([id number]) on 03/23/2020 1:55:14 AM   Radiology DG Chest Portable 1 View  Result Date: 03/23/2020 CLINICAL DATA:  Nausea, not eating, chills, shortness of breath, and generalized weakness. EXAM: PORTABLE CHEST 1 VIEW COMPARISON:  03/09/2020 FINDINGS: Postoperative changes in the mediastinum. Diffuse cardiac enlargement. Pulmonary vascular congestion. Increasing interstitial and alveolar infiltrates in the lungs suggesting progressing edema or possibly developing pneumonia. Small bilateral pleural effusions. No pneumothorax. Calcification of the aorta. Thoracic scoliosis convex towards the  right. IMPRESSION: Cardiac enlargement with pulmonary vascular congestion and increasing bilateral interstitial and alveolar infiltrates suggesting progressing edema or developing pneumonia. Electronically Signed   By: Lucienne Capers M.D.   On: 03/23/2020 02:50    Procedures Procedures (including critical care time)  Medications Ordered in ED Medications  heparin bolus via infusion 4,218 Units (has no administration in time range)  furosemide (LASIX) injection 40 mg (has no administration in time range)  heparin ADULT infusion 100 units/mL (25000 units/235mL sodium chloride 0.45%) (has no administration in time range)  iohexol (OMNIPAQUE) 350 MG/ML injection 75 mL (75 mLs Intravenous Contrast Given 03/23/20 0259)    ED Course  I have reviewed the triage vital signs and the nursing notes.  Pertinent labs & imaging results that were available during my care of the patient were reviewed by me and considered in my medical decision making (see chart for details).    MDM Number of Diagnoses or Management Options Critical Care Total time providing critical care: 30-74 minutes (heparin drip ) MDM Reviewed: nursing note and vitals Interpretation: labs, ECG, x-ray, MRI and CT scan (CHF by me on CXR and CT, normal electrolytes ) Total time providing critical care: 30-74 minutes (heparin drip ). This excludes time spent performing separately reportable procedures and services. Consults: admitting MD  CRITICAL CARE Performed by: Kimberlyn Quiocho K Kaylei Frink-Rasch Total critical care time: 60 minutes Critical care time was exclusive of separately billable procedures and treating other patients. Critical care was necessary to treat or prevent imminent or life-threatening deterioration. Critical care was time spent personally by me on the following activities: development of treatment plan with patient and/or surrogate as well as nursing, discussions with consultants, evaluation of patient's response to  treatment, examination of patient, obtaining history from patient or surrogate, ordering and performing treatments and interventions, ordering and review of laboratory studies, ordering and review of radiographic studies, pulse oximetry and re-evaluation of patient's condition.  Final Clinical Impression(s) / ED Diagnoses Final diagnoses:  Acute pulmonary edema ([place])  Acute pulmonary embolism, unspecified pulmonary embolism type, unspecified whether acute cor pulmonale present [facility])   Admit to medicine    Randall Colden, MD 03/23/20 [id number]

## 2020-03-23 NOTE — Progress Notes (Signed)
ANTICOAGULATION CONSULT NOTE - Initial Consult  Pharmacy Consult for heparin Indication: pulmonary embolus  No Known Allergies  Patient Measurements: Height: 6' (182.9 cm) Weight: 70.3 kg (155 lb) IBW/kg (Calculated) : 77.6  Vital Signs: Temp: 98.2 F (36.8 C) (10/19 2350) Temp Source: Oral (10/19 2350) BP: 120/85 (10/20 0245) Pulse Rate: 71 (10/20 0245)  Labs: Recent Labs    03/22/20 2011 03/23/20 0158  HGB 12.0*  --   HCT 39.5  --   PLT 186  --   CREATININE 1.10  --   TROPONINIHS  --  26*    Estimated Creatinine Clearance: 58.6 mL/min (by C-G formula based on SCr of 1.1 mg/dL).   Medical History: Past Medical History:  Diagnosis Date  . Anxiety   . Chronic tension headache   . Colon polyps   . Coronary artery disease 2008/2009   MI with PCI x 2, then PCI x 1 in 2009  . Depression   . Dyslipidemia   . Dysrhythmia    atrial fibrillation  . Hyperlipidemia   . Hypertension   . Lupus [facility])    sees Dr Trudie Reed  . Lupus disease of the lung   . 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 Myocardial infarction ([place]) 2008  . 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 Shortness of breath     Assessment: 74yo male c/o nausea, anorexia, chills, SOB, and general weakness, troponin mildly elevated but CT reveals bilateral PE; of note pt is on Eliquis for Afib, and he states lucidly that he takes it twice daily with the last dose taken Tuesday at 6-7 pm.  Goal of Therapy:  Heparin level 0.3-0.7 units/ml aPTT 66-102 seconds Monitor platelets by anticoagulation protocol: Yes   Plan:  Will start heparin now given PE despite compliant Eliquis with small bolus of heparin 1000 units and gtt at 1100 units/hr; monitor heparin levels, aPTT (while Eliquis affects anti-Xa), and CBC.  Wynona Neat, PharmD, BCPS  03/23/2020,3:24 AM

## 2020-03-23 NOTE — Progress Notes (Signed)
  Echocardiogram 2D Echocardiogram with 3D has been performed.  Cody Arellano M 03/23/2020, 3:03 PM

## 2020-03-23 NOTE — Progress Notes (Signed)
  NURSING PROGRESS NOTE  MONTERRIUS CARDOSA [id number] Admission Data: 03/23/2020 4:43 PM Attending Provider: Dessa Phi, DO WEX:[id number], Theophilus Kinds, MD Code Status: full   JSEAN TAUSSIG is a 74 y.o. male patient admitted from ED:  -No acute distress noted.  -No complaints of shortness of breath.  -No complaints of chest pain.   Cardiac Monitoring: Box #1in place. Cardiac monitor yields:normal sinus rhythm.  Blood pressure 109/75, pulse 70, temperature 98.3 F (36.8 C), temperature source Oral, resp. rate 17, height 6' (1.829 m), weight 70.3 kg, SpO2 92 %.   IV Fluids:heparin gtt going at 13.62ml/hr in R wrist   Allergies:  Patient has no known allergies.  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  Past Surgical History:   has a past surgical history that includes coronary stents (2009); Cardiac catheterization; Coronary/Graft Acute MI Revascularization (N/A, 10/17/2019); RIGHT/LEFT HEART CATH AND CORONARY ANGIOGRAPHY (N/A, 10/17/2019); IABP Insertion (N/A, 10/17/2019); CORONARY BALLOON ANGIOPLASTY (N/A, 10/17/2019); Coronary artery bypass graft (N/A, 10/17/2019); TEE without cardioversion (10/17/2019); Clipping of atrial appendage (N/A, 10/17/2019); Endoharvest vein of greater saphenous vein (Right, 10/17/2019); IR THORACENTESIS ASP PLEURAL SPACE W/IMG GUIDE (10/26/2019); and Incision and drainage abscess (N/A, 01/29/2020).  Social History:   reports that he quit smoking about 27 years ago. His smoking use included cigarettes. He has a 20.00 pack-year smoking history. He has never used smokeless tobacco. He reports that he does not drink alcohol and does not use drugs.  Skin:  WDL with old healed surgical scars  Patient/Family orientated to room. Information packet given to patient/family. Admission inpatient armband information verified with patient/family to include name and date of birth and placed on patient arm. Side rails up x 2, fall assessment and education completed with patient/family. Patient/family able to verbalize understanding of risk associated with falls and verbalized understanding to call for assistance before getting out of bed. Call light within reach. Patient/family able to voice and demonstrate understanding of unit orientation instructions.    Will continue to evaluate and treat per MD orders.

## 2020-03-23 NOTE — Plan of Care (Signed)
?  Problem: Activity: ?Goal: Ability to tolerate increased activity will improve ?Outcome: Progressing ?  ?Problem: Respiratory: ?Goal: Levels of oxygenation will improve ?Outcome: Progressing ?  ?

## 2020-03-23 NOTE — ED Notes (Signed)
EDP at bedside  

## 2020-03-23 NOTE — Progress Notes (Signed)
  PROGRESS NOTE  Patient admitted earlier this morning. See H&P.   Patient admitted with pleuritic pain, cough, shortness of breath.  He was found to have bilateral pulmonary embolism without evidence for heart strain.  Also with CHF exacerbation.  He was started on IV heparin as well as IV Lasix.  States that he is feeling better this morning since admission.  Remains on room air.  -Continue IV heparin, plan to transition back to Eliquis next 24 hours -Continue IV Lasix for diuresis -Obtain lower extremity Dopplers   Status is: Observation  The patient will require care spanning > 2 midnights and should be moved to inpatient because: IV treatments appropriate due to intensity of illness or inability to take PO  Dispo: The patient is from: Home              Anticipated d/c is to: Home              Anticipated d/c date is: 2 days              Patient currently is not medically stable to d/c.  Remains on IV heparin and IV Lasix today       Dessa Phi, DO [facility] 03/23/2020, 1:42 PM  Available via Epic secure chat 7am-7pm After these hours, please refer to coverage provider listed on [email or web address]

## 2020-03-23 NOTE — Plan of Care (Signed)
  Problem: Respiratory: Goal: Levels of oxygenation will improve 03/23/2020 2004 by Ethel Rana, RN Outcome: Progressing 03/23/2020 1623 by Ethel Rana, RN Outcome: Progressing

## 2020-03-23 NOTE — Progress Notes (Signed)
[place] for heparin Indication: pulmonary embolus  No Known Allergies  Patient Measurements: Height: 6' (182.9 cm) Weight: 70.3 kg (155 lb) IBW/kg (Calculated) : 77.6  Vital Signs: Temp: 97.7 F (36.5 C) (10/20 2105) Temp Source: Oral (10/20 2105) BP: 96/71 (10/20 2105) Pulse Rate: 72 (10/20 2105)  Labs: Recent Labs    03/22/20 2011 03/23/20 0158 03/23/20 0418 03/23/20 1308 03/23/20 2244  HGB 12.0*  --  12.3*  --   --   HCT 39.5  --  41.5  --   --   PLT 186  --  177  --   --   APTT  --   --   --  53* 76*  HEPARINUNFRC  --   --   --  1.86*  --   CREATININE 1.10  --  0.99  --   --   TROPONINIHS  --  26* 27*  --   --     Estimated Creatinine Clearance: 65.1 mL/min (by C-G formula based on SCr of 0.99 mg/dL).  Assessment: 74 y.o. male with PE, h/o Afib and Eliquis on hold, for heparin  Goal of Therapy:  Heparin level 0.3-0.7 units/ml aPTT 66-102 seconds Monitor platelets by anticoagulation protocol: Yes   Plan:  Continue Heparin at current rate  Follow-up am labs.   Phillis Knack, PharmD, BCPS  03/23/2020 11:37 PM

## 2020-03-24 ENCOUNTER — Telehealth: Payer: Self-pay | Admitting: Internal Medicine

## 2020-03-24 ENCOUNTER — Observation Stay (HOSPITAL_COMMUNITY): Payer: No Typology Code available for payment source

## 2020-03-24 DIAGNOSIS — I2699 Other pulmonary embolism without acute cor pulmonale: Secondary | ICD-10-CM

## 2020-03-24 DIAGNOSIS — E43 Unspecified severe protein-calorie malnutrition: Secondary | ICD-10-CM | POA: Diagnosis present

## 2020-03-24 DIAGNOSIS — R110 Nausea: Secondary | ICD-10-CM | POA: Diagnosis present

## 2020-03-24 DIAGNOSIS — F419 Anxiety disorder, unspecified: Secondary | ICD-10-CM | POA: Diagnosis present

## 2020-03-24 DIAGNOSIS — J849 Interstitial pulmonary disease, unspecified: Secondary | ICD-10-CM | POA: Diagnosis present

## 2020-03-24 DIAGNOSIS — Z681 Body mass index (BMI) 19 or less, adult: Secondary | ICD-10-CM | POA: Diagnosis not present

## 2020-03-24 DIAGNOSIS — I480 Paroxysmal atrial fibrillation: Secondary | ICD-10-CM | POA: Diagnosis present

## 2020-03-24 DIAGNOSIS — Z800 Family history of malignant neoplasm of digestive organs: Secondary | ICD-10-CM | POA: Diagnosis not present

## 2020-03-24 DIAGNOSIS — I248 Other forms of acute ischemic heart disease: Secondary | ICD-10-CM | POA: Diagnosis present

## 2020-03-24 DIAGNOSIS — G44229 Chronic tension-type headache, not intractable: Secondary | ICD-10-CM | POA: Diagnosis present

## 2020-03-24 DIAGNOSIS — I2510 Atherosclerotic heart disease of native coronary artery without angina pectoris: Secondary | ICD-10-CM | POA: Diagnosis present

## 2020-03-24 DIAGNOSIS — Z20822 Contact with and (suspected) exposure to covid-19: Secondary | ICD-10-CM | POA: Diagnosis present

## 2020-03-24 DIAGNOSIS — Z83438 Family history of other disorder of lipoprotein metabolism and other lipidemia: Secondary | ICD-10-CM | POA: Diagnosis not present

## 2020-03-24 DIAGNOSIS — Z7901 Long term (current) use of anticoagulants: Secondary | ICD-10-CM | POA: Diagnosis not present

## 2020-03-24 DIAGNOSIS — Z8249 Family history of ischemic heart disease and other diseases of the circulatory system: Secondary | ICD-10-CM | POA: Diagnosis not present

## 2020-03-24 DIAGNOSIS — I252 Old myocardial infarction: Secondary | ICD-10-CM | POA: Diagnosis not present

## 2020-03-24 DIAGNOSIS — Z951 Presence of aortocoronary bypass graft: Secondary | ICD-10-CM | POA: Diagnosis not present

## 2020-03-24 DIAGNOSIS — Z955 Presence of coronary angioplasty implant and graft: Secondary | ICD-10-CM | POA: Diagnosis not present

## 2020-03-24 DIAGNOSIS — Z811 Family history of alcohol abuse and dependence: Secondary | ICD-10-CM | POA: Diagnosis not present

## 2020-03-24 DIAGNOSIS — E785 Hyperlipidemia, unspecified: Secondary | ICD-10-CM | POA: Diagnosis present

## 2020-03-24 DIAGNOSIS — F32A Depression, unspecified: Secondary | ICD-10-CM | POA: Diagnosis present

## 2020-03-24 DIAGNOSIS — Z8719 Personal history of other diseases of the digestive system: Secondary | ICD-10-CM | POA: Diagnosis not present

## 2020-03-24 DIAGNOSIS — M329 Systemic lupus erythematosus, unspecified: Secondary | ICD-10-CM | POA: Diagnosis present

## 2020-03-24 DIAGNOSIS — I5023 Acute on chronic systolic (congestive) heart failure: Secondary | ICD-10-CM | POA: Diagnosis present

## 2020-03-24 LAB — BASIC METABOLIC PANEL
Anion gap: 9 (ref 5–15)
BUN: 13 mg/dL (ref 8–23)
CO2: 25 mmol/L (ref 22–32)
Calcium: 8.5 mg/dL — ABNORMAL LOW (ref 8.9–10.3)
Chloride: 104 mmol/L (ref 98–111)
Creatinine, Ser: 1.13 mg/dL (ref 0.61–1.24)
GFR, Estimated: >60 mL/min (ref >60)
Glucose, Bld: 86 mg/dL (ref 70–99)
Potassium: 3.7 mmol/L (ref 3.5–5.1)
Sodium: 138 mmol/L (ref 135–145)

## 2020-03-24 LAB — APTT
aPTT: 100 seconds — ABNORMAL HIGH (ref 24–36)
aPTT: 94 seconds — ABNORMAL HIGH (ref 24–36)

## 2020-03-24 LAB — HEPARIN LEVEL (UNFRACTIONATED): Heparin Unfractionated: 1.52 IU/mL — ABNORMAL HIGH (ref 0.30–0.70)

## 2020-03-24 LAB — CBC
HCT: 38.5 % — ABNORMAL LOW (ref 39.0–52.0)
Hemoglobin: 12 g/dL — ABNORMAL LOW (ref 13.0–17.0)
MCH: 28.6 pg (ref 26.0–34.0)
MCHC: 31.2 g/dL (ref 30.0–36.0)
MCV: 91.7 fL (ref 80.0–100.0)
Platelets: 173 10*3/uL (ref 150–400)
RBC: 4.2 MIL/uL — ABNORMAL LOW (ref 4.22–5.81)
RDW: 18.3 % — ABNORMAL HIGH (ref 11.5–15.5)
WBC: 3.9 10*3/uL — ABNORMAL LOW (ref 4.0–10.5)
nRBC: 0 % (ref 0.0–0.2)

## 2020-03-24 MED ORDER — ADULT MULTIVITAMIN W/MINERALS CH
1.0000 | ORAL_TABLET | Freq: Every day | ORAL | Status: DC
  Administered 2020-03-24 – 2020-03-25 (×2): 1 via ORAL
  Filled 2020-03-24 (×2): qty 1

## 2020-03-24 MED ORDER — FUROSEMIDE 40 MG PO TABS
40.0000 mg | ORAL_TABLET | Freq: Every day | ORAL | Status: DC
  Administered 2020-03-25: 40 mg via ORAL
  Filled 2020-03-24: qty 1

## 2020-03-24 NOTE — Progress Notes (Signed)
PROGRESS NOTE    Cody Arellano  TOI:[id number] DOB: 06/16/1945 DOA: 03/22/2020 PCP: Venia Carbon, MD     Brief Narrative:  Cody Arellano is a 74 y.o. male with medical history significant for lupus, interstitial lung disease, coronary artery disease, chronic systolic CHF, paroxysmal atrial fibrillation on Eliquis, now presenting to emergency department for evaluation of shortness of breath, weight loss, nausea, and decreased appetite.  Patient also notes some pleuritic pain and occasional cough with clear sputum, but denies any fevers and has not noticed any leg swelling or leg tenderness.  These symptoms began again about 2 weeks ago and have progressively worsened.  He was given a course of Levaquin early in this illness but did not improve.  Amiodarone has been on hold but symptoms continue to worsen.  Patient reports that he ran out of Eliquis about a week ago and went 4 days without the medication prior to obtaining a refill.  He denies any recent prolonged immobilization, history of DVT or PE, is not aware of any cancer, and notes that he had a surgery in August 2021. CTA chest is concerning for bilateral lower lobe PE without evidence for heart strain, as well as cardiac enlargement with reflux of contrast into the hepatic veins, pleural effusions, and interstitial and alveolar edema.  Patient was given 40 mg IV Lasix and started on IV heparin infusion in the ED.  New events last 24 hours / Subjective: Patient denies any shortness of breath or chest pain today.  Has not been out of bed yet.  Assessment & Plan:   Principal Problem:   Pulmonary embolism (HCC) Active Problems:   ILD (interstitial lung disease) (HCC)   CAD (coronary artery disease)   Systemic lupus erythematosus (HCC)   Paroxysmal atrial fibrillation (HCC)   Acute pulmonary embolism -Echocardiogram showed EF 45 to 50%, Anteroseptal wall appears akinetic, and bordering walls are hypokinetic. This is a similar pattern  to prior echo, but is now more pronounced.  RV systolic function moderately reduced -Venous Doppler ultrasound pending -Remains on room air -Continue IV heparin for another 24 hours, plan to transition back to Eliquis prior to discharge home  Acute on chronic systolic heart failure -EF 45 to 50% -BNP 1543.3 -Transition IV Lasix to p.o.  -Strict I's and O's, daily weight.  Has been diuresing well, urine output 2700 last 24 hours  Paroxysmal atrial fibrillation -CHA2DS2-VASc 3 -Continue IV heparin, plan to transition to Eliquis prior to discharge  Lupus -Continue Plaquenil, Imuran  CAD -Continue crestor   Demand ischemia -Troponin trend has been flat 26, 27 -Without complaints of chest pain   DVT prophylaxis: IV heparin   Code Status: Full code Family Communication: No family at bedside Disposition Plan:  Status is: Inpatient  Remains inpatient appropriate because:IV treatments appropriate due to intensity of illness or inability to take PO   Dispo: The patient is from: Home              Anticipated d/c is to: Home              Anticipated d/c date is: 1 day              Patient currently is not medically stable to d/c.  Remains on IV heparin today.  Plan to transition to Eliquis as long as he remains hemodynamically stable.  Hopeful discharge home 10/22.      Consultants:   None  Procedures:   None  Antimicrobials:  Anti-infectives (  From admission, onward)   Start     Dose/Rate Route Frequency Ordered Stop   03/23/20 1000  hydroxychloroquine (PLAQUENIL) tablet 400 mg        400 mg Oral Daily 03/23/20 0413          Objective: Vitals:   03/23/20 2105 03/24/20 0015 03/24/20 0426 03/24/20 0735  BP: 96/71 93/65 98/67  97/64  Pulse: 72 61 64 67  Resp: 17 17 17 16   Temp: 97.7 F (36.5 C) 98.8 F (37.1 C) 99 F (37.2 C) 98.4 F (36.9 C)  TempSrc: Oral Oral Oral Oral  SpO2: 96%  95% 95%  Weight:   66 kg   Height:        Intake/Output Summary (Last  24 hours) at 03/24/2020 1026 Last data filed at 03/24/2020 0800 Gross per 24 hour  Intake 945.63 ml  Output 1300 ml  Net -354.37 ml   Filed Weights   03/22/20 1943 03/23/20 0223 03/24/20 0426  Weight: 71.2 kg 70.3 kg 66 kg    Examination:  General exam: Appears calm and comfortable  Respiratory system: Clear to auscultation. Respiratory effort normal. No respiratory distress. No conversational dyspnea.  Cardiovascular system: S1 & S2 heard, RRR. No murmurs. No pedal edema. Gastrointestinal system: Abdomen is nondistended, soft and nontender. Normal bowel sounds heard. Central nervous system: Alert and oriented. No focal neurological deficits. Speech clear.  Extremities: Symmetric in appearance  Skin: No rashes, lesions or ulcers on exposed skin  Psychiatry: Judgement and insight appear normal. Mood & affect appropriate.   Data Reviewed: I have personally reviewed following labs and imaging studies  CBC: Recent Labs  Lab 03/22/20 2011 03/23/20 0418 03/24/20 0309  WBC 4.5 4.2 3.9*  HGB 12.0* 12.3* 12.0*  HCT 39.5 41.5 38.5*  MCV 93.4 94.1 91.7  PLT 186 177 237   Basic Metabolic Panel: Recent Labs  Lab 03/22/20 2011 03/23/20 0418 03/24/20 0309  NA 134* 136 138  K 4.2 4.3 3.7  CL 102 103 104  CO2 22 22 25   GLUCOSE 100* 74 86  BUN 15 13 13   CREATININE 1.10 0.99 1.13  CALCIUM 8.6* 8.9 8.5*  MG  --  2.2  --    GFR: Estimated Creatinine Clearance: 53.5 mL/min (by C-G formula based on SCr of 1.13 mg/dL). Liver Function Tests: Recent Labs  Lab 03/22/20 2011  AST 21  ALT 14  ALKPHOS 48  BILITOT 0.7  PROT 6.4*  ALBUMIN 3.1*   Recent Labs  Lab 03/22/20 2011  LIPASE 22   No results for input(s): AMMONIA in the last 168 hours. Coagulation Profile: No results for input(s): INR, PROTIME in the last 168 hours. Cardiac Enzymes: No results for input(s): CKTOTAL, CKMB, CKMBINDEX, TROPONINI in the last 168 hours. BNP (last 3 results) No results for input(s):  PROBNP in the last 8760 hours. HbA1C: No results for input(s): HGBA1C in the last 72 hours. CBG: No results for input(s): GLUCAP in the last 168 hours. Lipid Profile: No results for input(s): CHOL, HDL, LDLCALC, TRIG, CHOLHDL, LDLDIRECT in the last 72 hours. Thyroid Function Tests: No results for input(s): TSH, T4TOTAL, FREET4, T3FREE, THYROIDAB in the last 72 hours. Anemia Panel: No results for input(s): VITAMINB12, FOLATE, FERRITIN, TIBC, IRON, RETICCTPCT in the last 72 hours. Sepsis Labs: No results for input(s): PROCALCITON, LATICACIDVEN in the last 168 hours.  Recent Results (from the past 240 hour(s))  Respiratory Panel by RT PCR (Flu A&B, Covid) - Nasopharyngeal Swab     Status: None  Collection Time: 03/22/20  7:46 PM   Specimen: Nasopharyngeal Swab  Result Value Ref Range Status   SARS Coronavirus 2 by RT PCR NEGATIVE NEGATIVE Final    Comment: (NOTE) SARS-CoV-2 target nucleic acids are NOT DETECTED.  The SARS-CoV-2 RNA is generally detectable in upper respiratoy specimens during the acute phase of infection. The lowest concentration of SARS-CoV-2 viral copies this assay can detect is 131 copies/mL. A negative result does not preclude SARS-Cov-2 infection and should not be used as the sole basis for treatment or other patient management decisions. A negative result may occur with  improper specimen collection/handling, submission of specimen other than nasopharyngeal swab, presence of viral mutation(s) within the areas targeted by this assay, and inadequate number of viral copies (<131 copies/mL). A negative result must be combined with clinical observations, patient history, and epidemiological information. The expected result is Negative.  Fact Sheet for Patients:  PinkCheek.be  Fact Sheet for Healthcare Providers:  GravelBags.it  This test is no t yet approved or cleared by the [place] FDA and  has  been authorized for detection and/or diagnosis of SARS-CoV-2 by FDA under an Emergency Use Authorization (EUA). This EUA will remain  in effect (meaning this test can be used) for the duration of the COVID-19 declaration under Section 564(b)(1) of the Act, 21 U.S.C. section 360bbb-3(b)(1), unless the authorization is terminated or revoked sooner.     Influenza A by PCR NEGATIVE NEGATIVE Final   Influenza B by PCR NEGATIVE NEGATIVE Final    Comment: (NOTE) The Xpert Xpress SARS-CoV-2/FLU/RSV assay is intended as an aid in  the diagnosis of influenza from Nasopharyngeal swab specimens and  should not be used as a sole basis for treatment. Nasal washings and  aspirates are unacceptable for Xpert Xpress SARS-CoV-2/FLU/RSV  testing.  Fact Sheet for Patients: PinkCheek.be  Fact Sheet for Healthcare Providers: GravelBags.it  This test is not yet approved or cleared by the [place] FDA and  has been authorized for detection and/or diagnosis of SARS-CoV-2 by  FDA under an Emergency Use Authorization (EUA). This EUA will remain  in effect (meaning this test can be used) for the duration of the  Covid-19 declaration under Section 564(b)(1) of the Act, 21  U.S.C. section 360bbb-3(b)(1), unless the authorization is  terminated or revoked. Performed at [facility], [place] [address] [place], [place] [address]       Radiology Studies: CT Angio Chest PE W and/or Wo Contrast  Result Date: 03/23/2020 CLINICAL DATA:  Chest pain and shortness of breath. Nausea, lack of appetite, chills, and weakness for 1 week. EXAM: CT ANGIOGRAPHY CHEST WITH CONTRAST TECHNIQUE: Multidetector CT imaging of the chest was performed using the standard protocol during bolus administration of intravenous contrast. Multiplanar CT image reconstructions and MIPs were obtained to evaluate the vascular anatomy. CONTRAST:  20mL OMNIPAQUE IOHEXOL 350 MG/ML  SOLN COMPARISON:  11/12/2019 FINDINGS: Cardiovascular: Good opacification of the central and segmental pulmonary arteries. Filling defects are identified in bilateral lower lobe pulmonary arteries consistent with pulmonary embolus. This is new since the previous study. Diffuse cardiac enlargement. Reflux of contrast material into the hepatic veins may indicate right heart failure. RV to LV ratio is about 0.88, suggesting right heart strain is unlikely. No pericardial effusions. Postoperative changes in the mediastinum. Normal caliber of the thoracic aorta with scattered calcifications. Mediastinum/Nodes: Mediastinal lymph nodes are not pathologically enlarged. Esophagus is decompressed. Lungs/Pleura: Motion artifact limits examination. Moderate right and small left pleural effusions. Interstitial and  alveolar perihilar infiltrates, likely edema. Bronchiectasis and emphysematous changes in the lungs. Upper Abdomen: Small amount of ascites around the liver. Musculoskeletal: Thoracic scoliosis convex towards the right. No destructive bone lesions. Review of the MIP images confirms the above findings. IMPRESSION: 1. Bilateral lower lobe pulmonary emboli. A normal RV to LV ratio argues against right heart strain. 2. Diffuse cardiac enlargement with reflux of contrast material into the hepatic veins suggesting right heart failure. 3. Moderate right and small left pleural effusions with interstitial and alveolar perihilar infiltrates, likely edema. 4. Bronchiectasis and emphysematous changes in the lungs. 5. Small amount of ascites around the liver. 6. Emphysema and aortic atherosclerosis. Aortic Atherosclerosis (ICD10-I70.0) and Emphysema (ICD10-J43.9). Critical Value/emergent results were called by telephone at the time of interpretation on 03/23/2020 at 3:15 am to provider [facility] , who verbally acknowledged these results. Electronically Signed   By: Lucienne Capers M.D.   On: 03/23/2020 03:19   DG Chest  Portable 1 View  Result Date: 03/23/2020 CLINICAL DATA:  Nausea, not eating, chills, shortness of breath, and generalized weakness. EXAM: PORTABLE CHEST 1 VIEW COMPARISON:  03/09/2020 FINDINGS: Postoperative changes in the mediastinum. Diffuse cardiac enlargement. Pulmonary vascular congestion. Increasing interstitial and alveolar infiltrates in the lungs suggesting progressing edema or possibly developing pneumonia. Small bilateral pleural effusions. No pneumothorax. Calcification of the aorta. Thoracic scoliosis convex towards the right. IMPRESSION: Cardiac enlargement with pulmonary vascular congestion and increasing bilateral interstitial and alveolar infiltrates suggesting progressing edema or developing pneumonia. Electronically Signed   By: Lucienne Capers M.D.   On: 03/23/2020 02:50   ECHOCARDIOGRAM COMPLETE  Result Date: 03/23/2020    ECHOCARDIOGRAM REPORT   Patient Name:   MATAI CARPENITO Noorani Date of Exam: 03/23/2020 Medical Rec #:  [id number]    Height:       72.0 in Accession #:    [id number]   Weight:       155.0 lb Date of Birth:  1945-06-29    BSA:          1.912 m Patient Age:    76 years     BP:           118/81 mmHg Patient Gender: M            HR:           72 bpm. Exam Location:  Inpatient Procedure: 2D Echo and 3D Echo Indications:    Pulmonary Embolus 415.19 / I26.99  History:        Patient has prior history of Echocardiogram examinations, most                 recent 10/19/2019. CAD and Previous Myocardial Infarction, Prior                 CABG, Arrythmias:Atrial Fibrillation; Risk Factors:Hypertension                 and Dyslipidemia. Lupus.  Sonographer:    Darlina Sicilian RDCS Referring Phys: [id number] [place]  1. Left ventricular ejection fraction, by estimation, is 45 to 50%. The left ventricle has mildly decreased function. The left ventricle demonstrates regional wall motion abnormalities (see scoring diagram/findings for description). Left ventricular diastolic  parameters are indeterminate.  2. Right ventricular systolic function is moderately reduced. The right ventricular size is mildly enlarged. There is mildly elevated pulmonary artery systolic pressure.  3. Left atrial size was moderately dilated.  4. Right atrial size was severely dilated.  5. The mitral valve is  normal in structure. Moderate to severe mitral valve regurgitation.  6. Tricuspid valve regurgitation is moderate to severe.  7. The aortic valve is normal in structure. There is mild calcification of the aortic valve. There is mild thickening of the aortic valve. Aortic valve regurgitation is mild. Mild aortic valve sclerosis is present, with no evidence of aortic valve stenosis.  8. Pulmonic valve regurgitation is moderate.  9. The inferior vena cava is normal in size with <50% respiratory variability, suggesting right atrial pressure of 8 mmHg. Comparison(s): Changes from prior study are noted. Conclusion(s)/Recommendation(s): Since last echo, EF appears to be more reduced. Anteroseptal wall appears akinetic, and bordering walls are hypokinetic. This is a similar pattern to prior echo, but is now more pronounced. FINDINGS  Left Ventricle: Left ventricular ejection fraction, by estimation, is 45 to 50%. The left ventricle has mildly decreased function. The left ventricle demonstrates regional wall motion abnormalities. The left ventricular internal cavity size was normal in size. There is no left ventricular hypertrophy. Left ventricular diastolic parameters are indeterminate.  LV Wall Scoring: The entire anterior septum and apex are akinetic. The mid and distal anterior wall, mid and distal inferior wall, apical lateral segment, and mid inferoseptal segment are hypokinetic. The antero-lateral wall, posterior wall, basal anterior segment, basal inferior segment, and basal inferoseptal segment are normal. Right Ventricle: The right ventricular size is mildly enlarged. Right vetricular wall thickness was not  well visualized. Right ventricular systolic function is moderately reduced. There is mildly elevated pulmonary artery systolic pressure. The tricuspid  regurgitant velocity is 2.88 m/s, and with an assumed right atrial pressure of 8 mmHg, the estimated right ventricular systolic pressure is 78.2 mmHg. Left Atrium: Left atrial size was moderately dilated. Right Atrium: Right atrial size was severely dilated. Pericardium: There is no evidence of pericardial effusion. Mitral Valve: Eccentric MR jet directed posteriorly. At least moderate, and based on image 51 also may have Coanda effect, suggesting MR may be severe. The mitral valve is normal in structure. Moderate to severe mitral valve regurgitation. Tricuspid Valve: The tricuspid valve is normal in structure. Tricuspid valve regurgitation is moderate to severe. Aortic Valve: The aortic valve is normal in structure. There is mild calcification of the aortic valve. There is mild thickening of the aortic valve. Aortic valve regurgitation is mild. Aortic regurgitation PHT measures 701 msec. Mild aortic valve sclerosis is present, with no evidence of aortic valve stenosis. Aortic valve mean gradient measures 4.0 mmHg. Aortic valve peak gradient measures 7.7 mmHg. Aortic valve area, by VTI measures 1.81 cm. Pulmonic Valve: The pulmonic valve was grossly normal. Pulmonic valve regurgitation is moderate. No evidence of pulmonic stenosis. Aorta: The aortic root, ascending aorta and aortic arch are all structurally normal, with no evidence of dilitation or obstruction. Venous: The inferior vena cava is normal in size with less than 50% respiratory variability, suggesting right atrial pressure of 8 mmHg. IAS/Shunts: The atrial septum is grossly normal.  LEFT VENTRICLE PLAX 2D LVIDd:         5.30 cm  Diastology LVIDs:         4.00 cm  LV e' medial:    8.16 cm/s LV PW:         0.80 cm  LV E/e' medial:  11.7 LV IVS:        0.80 cm  LV e' lateral:   7.51 cm/s LVOT diam:     2.00  cm  LV E/e' lateral: 12.7 LV SV:  41 LV SV Index:   21 LVOT Area:     3.14 cm  RIGHT VENTRICLE TAPSE (M-mode): 1.4 cm LEFT ATRIUM             Index       RIGHT ATRIUM           Index LA diam:        3.80 cm 1.99 cm/m  RA Area:     27.80 cm LA Vol (A2C):   90.4 ml 47.28 ml/m RA Volume:   94.90 ml  49.64 ml/m LA Vol (A4C):   56.3 ml 29.45 ml/m LA Biplane Vol: 76.1 ml 39.80 ml/m  AORTIC VALVE AV Area (Vmax):    1.85 cm AV Area (Vmean):   1.72 cm AV Area (VTI):     1.81 cm AV Vmax:           139.00 cm/s AV Vmean:          93.100 cm/s AV VTI:            0.224 m AV Peak Grad:      7.7 mmHg AV Mean Grad:      4.0 mmHg LVOT Vmax:         82.00 cm/s LVOT Vmean:        51.100 cm/s LVOT VTI:          0.129 m LVOT/AV VTI ratio: 0.58 AI PHT:            701 msec  AORTA Ao Root diam: 3.40 cm MITRAL VALVE                 TRICUSPID VALVE MV Area (PHT): 5.54 cm      TR Peak grad:   33.2 mmHg MV Decel Time: 137 msec      TR Vmax:        288.00 cm/s MR Peak grad:    77.8 mmHg MR Mean grad:    53.0 mmHg   SHUNTS MR Vmax:         441.00 cm/s Systemic VTI:  0.13 m MR Vmean:        342.0 cm/s  Systemic Diam: 2.00 cm MR PISA:         2.26 cm MR PISA Eff ROA: 20 mm MR PISA Radius:  0.60 cm MV E velocity: 95.50 cm/s Buford Dresser MD Electronically signed by Buford Dresser MD Signature Date/Time: 03/23/2020/9:31:14 PM    Final       Scheduled Meds: . aspirin  81 mg Oral Daily  . azaTHIOprine  100 mg Oral BID  . feeding supplement  237 mL Oral BID BM  . furosemide  40 mg Intravenous BID  . hydroxychloroquine  400 mg Oral Daily  . rosuvastatin  20 mg Oral Daily  . sodium chloride flush  3 mL Intravenous Q12H  . sodium chloride flush  3 mL Intravenous Q12H   Continuous Infusions: . sodium chloride    . heparin 1,350 Units/hr (03/24/20 0900)     LOS: 0 days      Time spent: 25 minutes   Dessa Phi, DO [facility] 03/24/2020, 10:26 AM   Available via Epic secure chat  7am-7pm After these hours, please refer to coverage provider listed on [email or web address]

## 2020-03-24 NOTE — Progress Notes (Addendum)
ANTICOAGULATION CONSULT NOTE  Pharmacy Consult for heparin Indication: pulmonary embolus  No Known Allergies  Patient Measurements: Height: 6' (182.9 cm) Weight: 66 kg (145 lb 8 oz) (b) IBW/kg (Calculated) : 77.6  Vital Signs: Temp: 98.4 F (36.9 C) (10/21 0735) Temp Source: Oral (10/21 0735) BP: 97/64 (10/21 0735) Pulse Rate: 67 (10/21 0735)  Labs: Recent Labs    03/22/20 2011 03/22/20 2011 03/23/20 0158 03/23/20 0418 03/23/20 1308 03/23/20 2244 03/24/20 0309  HGB 12.0*   < >  --  12.3*  --   --  12.0*  HCT 39.5  --   --  41.5  --   --  38.5*  PLT 186  --   --  177  --   --  173  APTT  --   --   --   --  53* 76* 100*  HEPARINUNFRC  --   --   --   --  1.86*  --  1.52*  CREATININE 1.10  --   --  0.99  --   --  1.13  TROPONINIHS  --   --  26* 27*  --   --   --    < > = values in this interval not displayed.    Estimated Creatinine Clearance: 53.5 mL/min (by C-G formula based on SCr of 1.13 mg/dL).  Assessment: 74yo male c/o nausea, anorexia, chills, SOB, and general weakness, troponin mildly elevated but CT reveals bilateral PE. He is on apixaban PTA for history of afib. He states he is compliant but recently ran out and was off of it for a few days. Transitioned to IV heparin. APTT overnight therapeutic but near top of the range at 100. Heparin level remains elevated due to apixaban. CBC wnl. No active bleeding issues noted.  Goal of Therapy:  Heparin level 0.3-0.7 units/ml aPTT 66-102 seconds Monitor platelets by anticoagulation protocol: Yes   Plan:  Continue heparin IV at 1350 units/hr Check an 8 hr aPTT to confirm this morning Monitor daily aPTT, heparin level, CBC, and s/sx bleeding   Arturo Morton, PharmD, BCPS Please check AMION for all [place] contact numbers Clinical Pharmacist 03/24/2020 8:56 AM

## 2020-03-24 NOTE — Progress Notes (Signed)
Bilateral lower extremity venous duplex completed. Marland KitchenRefer to "CV Proc" under chart review to view preliminary results.  03/24/2020 1:35 PM Kelby Aline., MHA, RVT, RDCS, RDMS

## 2020-03-24 NOTE — Telephone Encounter (Signed)
Yes---I know. I have tried to call him but been unable to reach him

## 2020-03-24 NOTE — Progress Notes (Signed)
ANTICOAGULATION CONSULT NOTE  Pharmacy Consult for heparin Indication: pulmonary embolus  No Known Allergies  Patient Measurements: Height: 6' (182.9 cm) Weight: 66 kg (145 lb 8 oz) (b) IBW/kg (Calculated) : 77.6  Vital Signs: Temp: 97.6 F (36.4 C) (10/21 1134) Temp Source: Oral (10/21 1134) BP: 101/70 (10/21 1134) Pulse Rate: 72 (10/21 1134)  Labs: Recent Labs    03/22/20 2011 03/22/20 2011 03/23/20 0158 03/23/20 0418 03/23/20 1308 03/23/20 1308 03/23/20 2244 03/24/20 0309 03/24/20 1001  HGB 12.0*   < >  --  12.3*  --   --   --  12.0*  --   HCT 39.5  --   --  41.5  --   --   --  38.5*  --   PLT 186  --   --  177  --   --   --  173  --   APTT  --   --   --   --  53*   < > 76* 100* 94*  HEPARINUNFRC  --   --   --   --  1.86*  --   --  1.52*  --   CREATININE 1.10  --   --  0.99  --   --   --  1.13  --   TROPONINIHS  --   --  26* 27*  --   --   --   --   --    < > = values in this interval not displayed.    Estimated Creatinine Clearance: 53.5 mL/min (by C-G formula based on SCr of 1.13 mg/dL).  Assessment: 74yo male c/o nausea, anorexia, chills, SOB, and general weakness, troponin mildly elevated but CT reveals bilateral PE. He is on apixaban PTA for history of afib. He states he is compliant but recently ran out and was off of it for a few days. Transitioned to IV heparin. APTT remains therapeutic at 94. Heparin level remains elevated due to apixaban. CBC wnl. No active bleeding issues noted.  Goal of Therapy:  Heparin level 0.3-0.7 units/ml aPTT 66-102 seconds Monitor platelets by anticoagulation protocol: Yes   Plan:  Continue heparin IV at 1350 units/hr Monitor daily aPTT, heparin level, CBC, and s/sx bleeding Planning transition back to apixaban in 24hrs per MD note   Arturo Morton, PharmD, BCPS Please check AMION for all [facility] contact numbers Clinical Pharmacist 03/24/2020 12:42 PM

## 2020-03-24 NOTE — Progress Notes (Signed)
Initial Nutrition Assessment  DOCUMENTATION CODES:   Severe malnutrition in context of chronic illness  INTERVENTION:   -Continue Ensure Enlive po BID, each supplement provides 350 kcal and 20 grams of protein -MVI with minerals daily  NUTRITION DIAGNOSIS:   Severe Malnutrition related to chronic illness (CHF) as evidenced by moderate fat depletion, severe fat depletion, moderate muscle depletion, severe muscle depletion.  GOAL:   Patient will meet greater than or equal to 90% of their needs  MONITOR:   PO intake, Supplement acceptance, Labs, Weight trends, Skin, I & O's  REASON FOR ASSESSMENT:   Malnutrition Screening Tool    ASSESSMENT:   Cody Arellano is a 74 y.o. male with medical history significant for lupus, interstitial lung disease, coronary artery disease, chronic systolic CHF, paroxysmal atrial fibrillation on Eliquis, now presenting to emergency department for evaluation of shortness of breath, weight loss, nausea, and decreased appetite.  Pt admitted with pulmonary embolism.   Reviewed I/O's: -2.2 L x 24 hours and -3.1L since admission  UOP: 2.7 L x 24 hours  Spoke with pt at bedside, who was pleasant and in good spirits today. He reports a general decline in health over the past 5 months, after being hospitalized with a heart attack. Since then, he noticed a decreased appetite, which became significantly worse over the past 2 weeks. Pt wife prepares 3 meals per day (pt wife cooks healthfully and tries to follow a 2 gram sodium diet at home), however, pt estimated he was only consuming about half of the food he was served during this time period. Per his report, intake has improved since hospitalization- he was able to eat most of a hamburger and about half of a bag of chips last night. Noted meal completion 80-100%.   Reviewed wt hx; pt has experienced a 5.6% wt loss over the past 3 months. While this is not significant for itme frame, it is concerned given  decreased oral intake. Per pt, his UBW is around 155#- he estimates he has lost about 8 pounds over the past 6 months.   Discussed importance of good meal and supplement intake to promote healing. Pt amenable to continue Ensure supplements, stating he started taking approximately 1-2 per day PTA approximately one month ago. Pt also expressed concern about now being able to meet nutritional needs on a restrictive, heart healthy diet. RD discussed importance of CHF management, but also proposed idea of a liberalized diet when appetite was poor. Pt was very grateful for RD visit.   Medications reviewed and include lasix.   Labs reviewed.   NUTRITION - FOCUSED PHYSICAL EXAM:    Most Recent Value  Orbital Region Severe depletion  Upper Arm Region Severe depletion  Thoracic and Lumbar Region Moderate depletion  Buccal Region Moderate depletion  Temple Region Severe depletion  Clavicle Bone Region Severe depletion  Clavicle and Acromion Bone Region Severe depletion  Scapular Bone Region Severe depletion  Dorsal Hand Moderate depletion  Patellar Region Moderate depletion  Anterior Thigh Region Moderate depletion  Posterior Calf Region Moderate depletion  Edema (RD Assessment) None  Hair Reviewed  Eyes Reviewed  Mouth Reviewed  Skin Reviewed  Nails Reviewed       Diet Order:   Diet Order            Diet Heart Room service appropriate? Yes; Fluid consistency: Thin; Fluid restriction: 1500 mL Fluid  Diet effective now  EDUCATION NEEDS:   Education needs have been addressed  Skin:  Skin Assessment: Reviewed RN Assessment  Last BM:  03/22/20  Height:   Ht Readings from Last 1 Encounters:  03/23/20 6' (1.829 m)    Weight:   Wt Readings from Last 1 Encounters:  03/24/20 66 kg    Ideal Body Weight:  80.9 kg  BMI:  Body mass index is 19.73 kg/m.  Estimated Nutritional Needs:   Kcal:  2100-2300  Protein:  120-135 grams  Fluid:  1,5  L    Loistine Chance, RD, LDN, [place] Registered Dietitian II Certified Diabetes Care and Education Specialist Please refer to [facility] for RD and/or RD on-call/weekend/after hours pager

## 2020-03-24 NOTE — Telephone Encounter (Signed)
I did reach him now. Hopefully home tomorrow Acute pulmonary embolus (missed eliquis doses?) Also IV diuresis Will go to rheumatologist at [facility] on Monday

## 2020-03-24 NOTE — Telephone Encounter (Signed)
Pt called to state that he is in the hospital

## 2020-03-25 ENCOUNTER — Other Ambulatory Visit (HOSPITAL_COMMUNITY): Payer: Self-pay | Admitting: Family Medicine

## 2020-03-25 ENCOUNTER — Telehealth: Payer: Self-pay

## 2020-03-25 DIAGNOSIS — I2699 Other pulmonary embolism without acute cor pulmonale: Secondary | ICD-10-CM | POA: Diagnosis not present

## 2020-03-25 LAB — BASIC METABOLIC PANEL
Anion gap: 9 (ref 5–15)
BUN: 14 mg/dL (ref 8–23)
CO2: 28 mmol/L (ref 22–32)
Calcium: 8.5 mg/dL — ABNORMAL LOW (ref 8.9–10.3)
Chloride: 102 mmol/L (ref 98–111)
Creatinine, Ser: 1.19 mg/dL (ref 0.61–1.24)
GFR, Estimated: >60 mL/min (ref >60)
Glucose, Bld: 89 mg/dL (ref 70–99)
Potassium: 4 mmol/L (ref 3.5–5.1)
Sodium: 139 mmol/L (ref 135–145)

## 2020-03-25 LAB — CBC
HCT: 35.6 % — ABNORMAL LOW (ref 39.0–52.0)
Hemoglobin: 10.9 g/dL — ABNORMAL LOW (ref 13.0–17.0)
MCH: 28.3 pg (ref 26.0–34.0)
MCHC: 30.6 g/dL (ref 30.0–36.0)
MCV: 92.5 fL (ref 80.0–100.0)
Platelets: 154 10*3/uL (ref 150–400)
RBC: 3.85 MIL/uL — ABNORMAL LOW (ref 4.22–5.81)
RDW: 17.8 % — ABNORMAL HIGH (ref 11.5–15.5)
WBC: 4 10*3/uL (ref 4.0–10.5)
nRBC: 0 % (ref 0.0–0.2)

## 2020-03-25 LAB — HEPARIN LEVEL (UNFRACTIONATED): Heparin Unfractionated: 1.3 IU/mL — ABNORMAL HIGH (ref 0.30–0.70)

## 2020-03-25 LAB — APTT: aPTT: 119 seconds — ABNORMAL HIGH (ref 24–36)

## 2020-03-25 MED ORDER — APIXABAN 5 MG PO TABS
10.0000 mg | ORAL_TABLET | Freq: Two times a day (BID) | ORAL | Status: DC
  Administered 2020-03-25: 10 mg via ORAL
  Filled 2020-03-25: qty 2

## 2020-03-25 MED ORDER — APIXABAN 5 MG PO TABS
5.0000 mg | ORAL_TABLET | Freq: Two times a day (BID) | ORAL | 0 refills | Status: AC
Start: 2020-04-01 — End: 2020-05-01

## 2020-03-25 MED ORDER — APIXABAN 5 MG PO TABS: 5.0000 mg | ORAL_TABLET | Freq: Two times a day (BID) | ORAL | Status: DC

## 2020-03-25 MED ORDER — APIXABAN 5 MG PO TABS: 10.0000 mg | ORAL_TABLET | Freq: Two times a day (BID) | ORAL | 0 refills | Status: DC

## 2020-03-25 MED FILL — ELIQUIS STARTER PACK 5 MG T: 5 | 30 days supply | Qty: 74 | Fill #0

## 2020-03-25 NOTE — Discharge Summary (Signed)
Physician Discharge Summary  Cody Arellano:[id number] DOB: Apr 16, 1946 DOA: 03/22/2020  PCP: Cody Carbon, MD  Admit date: 03/22/2020 Discharge date: 03/25/2020  Admitted From: Home Disposition: Home  Recommendations for Outpatient Follow-up:  1. Follow up with PCP in 1-2 weeks 2. Please obtain BMP/CBC in one week 3. Please follow up with your PCP on the following pending results: Unresulted Labs (From admission, onward)          Start     Ordered   03/26/20 0500  CBC  Tomorrow morning,   R        03/25/20 0830           Home Health: None Equipment/Devices: None  Discharge Condition: Stable CODE STATUS: Full code Diet recommendation: Cardiac  Subjective: Seen and examined.  He has no complaints.  Ready to go home.  Brief/Interim Summary: Cody Arellano is a 74 y.o.malewith medical history significant forlupus, interstitial lung disease, coronary artery disease, chronic systolic CHF, paroxysmal atrial fibrillation on Eliquis presented to ED for evaluation of shortness of breath, weight loss, nausea, and decreased appetite. Patient also notes some pleuritic pain and occasional cough with clear sputum, but denied any fevers and has not noticed any leg swelling or leg tenderness. These symptoms began again about 2 weeks ago and have progressively worsened. He was given a course of Levaquin early in this illness but did not improve. Amiodarone has been on hold but symptoms continue to worsen. Patient reported that he ran out of Eliquis about a week ago and went 4 days without the medication prior to obtaining a refill. He denied any recent prolonged immobilization, history of DVT or PE. CTA chest concerning for bilateral lower lobe PE without evidence for heart strain, as well as cardiac enlargement with reflux of contrast into the hepatic veins, pleural effusions, and interstitial and alveolar edema. Patient was given 40 mg IV Lasix and started on IV heparin infusion in  the ED. admitted under hospital service. Echocardiogram showed EF 45 to 50%, Anteroseptal wall appears akinetic, and bordering walls are hypokinetic. This is a similar pattern to prior echo, but is now more pronounced.  RV systolic function moderately reduced.  Doppler lower extremity negative for DVT.  He was subsequently transitioned to oral Lasix.  When seen this morning, he feels good without having any symptoms.  He was transition from IV heparin to Eliquis this morning.  He is being discharged in stable condition.  He will be discharged on Eliquis 10 mg twice daily for 7 days followed by 5 mg p.o. twice daily.  He verbalized understanding the instructions that he has to be extremely compliant with this and all other medications.  Discharge Diagnoses:  Principal Problem:   Pulmonary embolism (HCC) Active Problems:   ILD (interstitial lung disease) (HCC)   CAD (coronary artery disease)   Systemic lupus erythematosus (HCC)   Paroxysmal atrial fibrillation (HCC)   Protein-calorie malnutrition, severe ([place])    Discharge Instructions   Allergies as of 03/25/2020   No Known Allergies     Medication List    TAKE these medications   apixaban 5 MG Tabs tablet Commonly known as: ELIQUIS Take 2 tablets (10 mg total) by mouth 2 (two) times daily for 7 days. What changed: how much to take   apixaban 5 MG Tabs tablet Commonly known as: ELIQUIS Take 1 tablet (5 mg total) by mouth 2 (two) times daily. Start taking on: April 01, 2020 What changed: You were already taking  a medication with the same name, and this prescription was added. Make sure you understand how and when to take each.   Artificial Tears 1.4 % ophthalmic solution Generic drug: polyvinyl alcohol Place 2 drops into both eyes daily.   aspirin EC 81 MG tablet Take 1 tablet (81 mg total) by mouth daily.   azaTHIOprine 50 MG tablet Commonly known as: IMURAN Take 100 mg by mouth 2 (two) times daily.   hydroxychloroquine  200 MG tablet Commonly known as: PLAQUENIL Take 400 mg by mouth at bedtime.   oxyCODONE 5 MG immediate release tablet Commonly known as: Oxy IR/ROXICODONE Take 1 tablet (5 mg total) by mouth every 6 (six) hours as needed for severe pain.   polyethylene glycol 17 g packet Commonly known as: MIRALAX / GLYCOLAX Take 17 g by mouth daily.   rosuvastatin 20 MG tablet Commonly known as: CRESTOR Take 20 mg by mouth daily.   tamsulosin 0.4 MG Caps capsule Commonly known as: FLOMAX Take 0.4 mg by mouth every evening.       Follow-up Information    Viviana Simpler I, MD Follow up in 1 week(s).   Specialties: Internal Medicine, Pediatrics Contact information: [place] [address] [phone]        Nahser, Wonda Cheng, MD .   Specialty: Cardiology Contact information: [address] [place] [address] [phone]              No Known Allergies  Consultations: None   Procedures/Studies: DG Chest 2 View  Result Date: 03/09/2020 CLINICAL DATA:  Dyspnea on exertion EXAM: CHEST - 2 VIEW COMPARISON:  11/16/2019, CT 11/13/2019 FINDINGS: Chronic interstitial opacity. Borderline to mild cardiomegaly with central congestion. Development of small pleural effusions and hazy airspace disease at both bases. Aortic atherosclerosis. No pneumothorax. Atrial appendage clip. Scoliosis of the spine. Chronic deformity distal left clavicle. IMPRESSION: Interval borderline to mild cardiomegaly with vascular congestion and small pleural effusions. Hazy edema or infiltrates at the bases. Electronically Signed   By: Donavan Foil M.D.   On: 03/09/2020 22:07   CT Angio Chest PE W and/or Wo Contrast  Result Date: 03/23/2020 CLINICAL DATA:  Chest pain and shortness of breath. Nausea, lack of appetite, chills, and weakness for 1 week. EXAM: CT ANGIOGRAPHY CHEST WITH CONTRAST TECHNIQUE: Multidetector CT imaging of the chest was performed using the standard  protocol during bolus administration of intravenous contrast. Multiplanar CT image reconstructions and MIPs were obtained to evaluate the vascular anatomy. CONTRAST:  27mL OMNIPAQUE IOHEXOL 350 MG/ML SOLN COMPARISON:  11/12/2019 FINDINGS: Cardiovascular: Good opacification of the central and segmental pulmonary arteries. Filling defects are identified in bilateral lower lobe pulmonary arteries consistent with pulmonary embolus. This is new since the previous study. Diffuse cardiac enlargement. Reflux of contrast material into the hepatic veins may indicate right heart failure. RV to LV ratio is about 0.88, suggesting right heart strain is unlikely. No pericardial effusions. Postoperative changes in the mediastinum. Normal caliber of the thoracic aorta with scattered calcifications. Mediastinum/Nodes: Mediastinal lymph nodes are not pathologically enlarged. Esophagus is decompressed. Lungs/Pleura: Motion artifact limits examination. Moderate right and small left pleural effusions. Interstitial and alveolar perihilar infiltrates, likely edema. Bronchiectasis and emphysematous changes in the lungs. Upper Abdomen: Small amount of ascites around the liver. Musculoskeletal: Thoracic scoliosis convex towards the right. No destructive bone lesions. Review of the MIP images confirms the above findings. IMPRESSION: 1. Bilateral lower lobe pulmonary emboli. A normal RV to LV ratio  argues against right heart strain. 2. Diffuse cardiac enlargement with reflux of contrast material into the hepatic veins suggesting right heart failure. 3. Moderate right and small left pleural effusions with interstitial and alveolar perihilar infiltrates, likely edema. 4. Bronchiectasis and emphysematous changes in the lungs. 5. Small amount of ascites around the liver. 6. Emphysema and aortic atherosclerosis. Aortic Atherosclerosis (ICD10-I70.0) and Emphysema (ICD10-J43.9). Critical Value/emergent results were called by telephone at the time of  interpretation on 03/23/2020 at 3:15 am to provider [facility] , who verbally acknowledged these results. Electronically Signed   By: Lucienne Capers M.D.   On: 03/23/2020 03:19   DG Chest Portable 1 View  Result Date: 03/23/2020 CLINICAL DATA:  Nausea, not eating, chills, shortness of breath, and generalized weakness. EXAM: PORTABLE CHEST 1 VIEW COMPARISON:  03/09/2020 FINDINGS: Postoperative changes in the mediastinum. Diffuse cardiac enlargement. Pulmonary vascular congestion. Increasing interstitial and alveolar infiltrates in the lungs suggesting progressing edema or possibly developing pneumonia. Small bilateral pleural effusions. No pneumothorax. Calcification of the aorta. Thoracic scoliosis convex towards the right. IMPRESSION: Cardiac enlargement with pulmonary vascular congestion and increasing bilateral interstitial and alveolar infiltrates suggesting progressing edema or developing pneumonia. Electronically Signed   By: Lucienne Capers M.D.   On: 03/23/2020 02:50   ECHOCARDIOGRAM COMPLETE  Result Date: 03/23/2020    ECHOCARDIOGRAM REPORT   [name] Date of Exam: 03/23/2020 Medical Rec #:  [id number]    Height:       72.0 in Accession #:    [id number]   Weight:       155.0 lb Date of Birth:  [birth year]    BSA:          1.912 m Patient Age:    30 years     BP:           118/81 mmHg Patient Gender: M            HR:           72 bpm. Exam Location:  Inpatient Procedure: 2D Echo and 3D Echo Indications:    Pulmonary Embolus 415.19 / I26.99  History:        Patient has prior history of Echocardiogram examinations, most                 recent 10/19/2019. CAD and Previous Myocardial Infarction, Prior                 CABG, Arrythmias:Atrial Fibrillation; Risk Factors:Hypertension                 and Dyslipidemia. Lupus.  Sonographer:    Darlina Sicilian RDCS Referring Phys: [id number] [place]  1. Left ventricular ejection fraction, by estimation, is 45 to 50%. The  left ventricle has mildly decreased function. The left ventricle demonstrates regional wall motion abnormalities (see scoring diagram/findings for description). Left ventricular diastolic parameters are indeterminate.  2. Right ventricular systolic function is moderately reduced. The right ventricular size is mildly enlarged. There is mildly elevated pulmonary artery systolic pressure.  3. Left atrial size was moderately dilated.  4. Right atrial size was severely dilated.  5. The mitral valve is normal in structure. Moderate to severe mitral valve regurgitation.  6. Tricuspid valve regurgitation is moderate to severe.  7. The aortic valve is normal in structure. There is mild calcification of the aortic valve. There is mild thickening of the aortic valve. Aortic valve regurgitation is mild. Mild aortic valve sclerosis is present,  with no evidence of aortic valve stenosis.  8. Pulmonic valve regurgitation is moderate.  9. The inferior vena cava is normal in size with <50% respiratory variability, suggesting right atrial pressure of 8 mmHg. Comparison(s): Changes from prior study are noted. Conclusion(s)/Recommendation(s): Since last echo, EF appears to be more reduced. Anteroseptal wall appears akinetic, and bordering walls are hypokinetic. This is a similar pattern to prior echo, but is now more pronounced. FINDINGS  Left Ventricle: Left ventricular ejection fraction, by estimation, is 45 to 50%. The left ventricle has mildly decreased function. The left ventricle demonstrates regional wall motion abnormalities. The left ventricular internal cavity size was normal in size. There is no left ventricular hypertrophy. Left ventricular diastolic parameters are indeterminate.  LV Wall Scoring: The entire anterior septum and apex are akinetic. The mid and distal anterior wall, mid and distal inferior wall, apical lateral segment, and mid inferoseptal segment are hypokinetic. The antero-lateral wall, posterior wall, basal  anterior segment, basal inferior segment, and basal inferoseptal segment are normal. Right Ventricle: The right ventricular size is mildly enlarged. Right vetricular wall thickness was not well visualized. Right ventricular systolic function is moderately reduced. There is mildly elevated pulmonary artery systolic pressure. The tricuspid  regurgitant velocity is 2.88 m/s, and with an assumed right atrial pressure of 8 mmHg, the estimated right ventricular systolic pressure is 10.2 mmHg. Left Atrium: Left atrial size was moderately dilated. Right Atrium: Right atrial size was severely dilated. Pericardium: There is no evidence of pericardial effusion. Mitral Valve: Eccentric MR jet directed posteriorly. At least moderate, and based on image 51 also may have Coanda effect, suggesting MR may be severe. The mitral valve is normal in structure. Moderate to severe mitral valve regurgitation. Tricuspid Valve: The tricuspid valve is normal in structure. Tricuspid valve regurgitation is moderate to severe. Aortic Valve: The aortic valve is normal in structure. There is mild calcification of the aortic valve. There is mild thickening of the aortic valve. Aortic valve regurgitation is mild. Aortic regurgitation PHT measures 701 msec. Mild aortic valve sclerosis is present, with no evidence of aortic valve stenosis. Aortic valve mean gradient measures 4.0 mmHg. Aortic valve peak gradient measures 7.7 mmHg. Aortic valve area, by VTI measures 1.81 cm. Pulmonic Valve: The pulmonic valve was grossly normal. Pulmonic valve regurgitation is moderate. No evidence of pulmonic stenosis. Aorta: The aortic root, ascending aorta and aortic arch are all structurally normal, with no evidence of dilitation or obstruction. Venous: The inferior vena cava is normal in size with less than 50% respiratory variability, suggesting right atrial pressure of 8 mmHg. IAS/Shunts: The atrial septum is grossly normal.  LEFT VENTRICLE PLAX 2D LVIDd:          5.30 cm  Diastology LVIDs:         4.00 cm  LV e' medial:    8.16 cm/s LV PW:         0.80 cm  LV E/e' medial:  11.7 LV IVS:        0.80 cm  LV e' lateral:   7.51 cm/s LVOT diam:     2.00 cm  LV E/e' lateral: 12.7 LV SV:         41 LV SV Index:   21 LVOT Area:     3.14 cm  RIGHT VENTRICLE TAPSE (M-mode): 1.4 cm LEFT ATRIUM             Index       RIGHT ATRIUM  Index LA diam:        3.80 cm 1.99 cm/m  RA Area:     27.80 cm LA Vol (A2C):   90.4 ml 47.28 ml/m RA Volume:   94.90 ml  49.64 ml/m LA Vol (A4C):   56.3 ml 29.45 ml/m LA Biplane Vol: 76.1 ml 39.80 ml/m  AORTIC VALVE AV Area (Vmax):    1.85 cm AV Area (Vmean):   1.72 cm AV Area (VTI):     1.81 cm AV Vmax:           139.00 cm/s AV Vmean:          93.100 cm/s AV VTI:            0.224 m AV Peak Grad:      7.7 mmHg AV Mean Grad:      4.0 mmHg LVOT Vmax:         82.00 cm/s LVOT Vmean:        51.100 cm/s LVOT VTI:          0.129 m LVOT/AV VTI ratio: 0.58 AI PHT:            701 msec  AORTA Ao Root diam: 3.40 cm MITRAL VALVE                 TRICUSPID VALVE MV Area (PHT): 5.54 cm      TR Peak grad:   33.2 mmHg MV Decel Time: 137 msec      TR Vmax:        288.00 cm/s MR Peak grad:    77.8 mmHg MR Mean grad:    53.0 mmHg   SHUNTS MR Vmax:         441.00 cm/s Systemic VTI:  0.13 m MR Vmean:        342.0 cm/s  Systemic Diam: 2.00 cm MR PISA:         2.26 cm MR PISA Eff ROA: 20 mm MR PISA Radius:  0.60 cm MV E velocity: 95.50 cm/s Buford Dresser MD Electronically signed by Buford Dresser MD Signature Date/Time: 03/23/2020/9:31:14 PM    Final    VAS [place] LOWER EXTREMITY VENOUS (DVT)  Result Date: 03/24/2020  Lower Venous DVTStudy Indications: Pulmonary embolism.  Comparison Study: No prior study Performing Technologist: Maudry Mayhew MHA, RDMS, RVT, RDCS  Examination Guidelines: A complete evaluation includes B-mode imaging, spectral Doppler, color Doppler, and power Doppler as needed of all accessible portions of each vessel.  Bilateral testing is considered an integral part of a complete examination. Limited examinations for reoccurring indications may be performed as noted. The reflux portion of the exam is performed with the patient in reverse Trendelenburg.  +---------+---------------+---------+-----------+----------+--------------+ RIGHT    CompressibilityPhasicitySpontaneityPropertiesThrombus Aging +---------+---------------+---------+-----------+----------+--------------+ CFV      Full           Yes      Yes                                 +---------+---------------+---------+-----------+----------+--------------+ SFJ      Full                                                        +---------+---------------+---------+-----------+----------+--------------+ FV Prox  Full                                                        +---------+---------------+---------+-----------+----------+--------------+  FV Mid   Full                                                        +---------+---------------+---------+-----------+----------+--------------+ FV DistalFull                                                        +---------+---------------+---------+-----------+----------+--------------+ PFV      Full                                                        +---------+---------------+---------+-----------+----------+--------------+ POP      Full           Yes      Yes                                 +---------+---------------+---------+-----------+----------+--------------+ PTV      Full                                                        +---------+---------------+---------+-----------+----------+--------------+ PERO     Full                                                        +---------+---------------+---------+-----------+----------+--------------+   +---------+---------------+---------+-----------+----------+--------------+ LEFT      CompressibilityPhasicitySpontaneityPropertiesThrombus Aging +---------+---------------+---------+-----------+----------+--------------+ CFV      Full           Yes      Yes                                 +---------+---------------+---------+-----------+----------+--------------+ SFJ      Full                                                        +---------+---------------+---------+-----------+----------+--------------+ FV Prox  Full                                                        +---------+---------------+---------+-----------+----------+--------------+ FV Mid   Full                                                        +---------+---------------+---------+-----------+----------+--------------+  FV DistalFull                                                        +---------+---------------+---------+-----------+----------+--------------+ PFV      Full                                                        +---------+---------------+---------+-----------+----------+--------------+ POP      Full           Yes      Yes                                 +---------+---------------+---------+-----------+----------+--------------+ PTV      Full                                                        +---------+---------------+---------+-----------+----------+--------------+ PERO     Full                                                        +---------+---------------+---------+-----------+----------+--------------+     Summary: RIGHT: - There is no evidence of deep vein thrombosis in the lower extremity.  - No cystic structure found in the popliteal fossa.  LEFT: - There is no evidence of deep vein thrombosis in the lower extremity.  - No cystic structure found in the popliteal fossa.  *See table(s) above for measurements and observations. Electronically signed by Servando Snare MD on 03/24/2020 at 4:08:41 PM.    Final       Discharge  Exam: Vitals:   03/24/20 2149 03/25/20 0437  BP: 105/77 99/65  Pulse: 68 66  Resp: 16 16  Temp: 97.6 F (36.4 C) 98.3 F (36.8 C)  SpO2: 93% 91%   Vitals:   03/24/20 1134 03/24/20 1535 03/24/20 2149 03/25/20 0437  BP: 101/70 100/69 105/77 99/65  Pulse: 72 71 68 66  Resp: 16 16 16 16   Temp: 97.6 F (36.4 C) 98.5 F (36.9 C) 97.6 F (36.4 C) 98.3 F (36.8 C)  TempSrc: Oral Oral Oral Oral  SpO2: 96% 92% 93% 91%  Weight:    65.9 kg  Height:        General: Pt is alert, awake, not in acute distress Cardiovascular: Irregularly irregular rate and rhythm, S1/S2 +, no rubs, no gallops Respiratory: CTA bilaterally, no wheezing, no rhonchi Abdominal: Soft, NT, ND, bowel sounds + Extremities: no edema, no cyanosis    The results of significant diagnostics from this hospitalization (including imaging, microbiology, ancillary and laboratory) are listed below for reference.     Microbiology: Recent Results (from the past 240 hour(s))  Respiratory Panel by RT PCR (Flu A&B, Covid) - Nasopharyngeal Swab     Status: None   Collection Time: 03/22/20  7:46 PM  Specimen: Nasopharyngeal Swab  Result Value Ref Range Status   SARS Coronavirus 2 by RT PCR NEGATIVE NEGATIVE Final    Comment: (NOTE) SARS-CoV-2 target nucleic acids are NOT DETECTED.  The SARS-CoV-2 RNA is generally detectable in upper respiratoy specimens during the acute phase of infection. The lowest concentration of SARS-CoV-2 viral copies this assay can detect is 131 copies/mL. A negative result does not preclude SARS-Cov-2 infection and should not be used as the sole basis for treatment or other patient management decisions. A negative result may occur with  improper specimen collection/handling, submission of specimen other than nasopharyngeal swab, presence of viral mutation(s) within the areas targeted by this assay, and inadequate number of viral copies (<131 copies/mL). A negative result must be combined with  clinical observations, patient history, and epidemiological information. The expected result is Negative.  Fact Sheet for Patients:  PinkCheek.be  Fact Sheet for Healthcare Providers:  GravelBags.it  This test is no t yet approved or cleared by the [place] FDA and  has been authorized for detection and/or diagnosis of SARS-CoV-2 by FDA under an Emergency Use Authorization (EUA). This EUA will remain  in effect (meaning this test can be used) for the duration of the COVID-19 declaration under Section 564(b)(1) of the Act, 21 U.S.C. section 360bbb-3(b)(1), unless the authorization is terminated or revoked sooner.     Influenza A by PCR NEGATIVE NEGATIVE Final   Influenza B by PCR NEGATIVE NEGATIVE Final    Comment: (NOTE) The Xpert Xpress SARS-CoV-2/FLU/RSV assay is intended as an aid in  the diagnosis of influenza from Nasopharyngeal swab specimens and  should not be used as a sole basis for treatment. Nasal washings and  aspirates are unacceptable for Xpert Xpress SARS-CoV-2/FLU/RSV  testing.  Fact Sheet for Patients: PinkCheek.be  Fact Sheet for Healthcare Providers: GravelBags.it  This test is not yet approved or cleared by the [place] FDA and  has been authorized for detection and/or diagnosis of SARS-CoV-2 by  FDA under an Emergency Use Authorization (EUA). This EUA will remain  in effect (meaning this test can be used) for the duration of the  Covid-19 declaration under Section 564(b)(1) of the Act, 21  U.S.C. section 360bbb-3(b)(1), unless the authorization is  terminated or revoked. Performed at [facility], [place] [address] [place], [place] [address]      Labs: BNP (last 3 results) Recent Labs    03/23/20 0418  BNP 6,195.0*   Basic Metabolic Panel: Recent Labs  Lab 03/22/20 2011 03/23/20 0418 03/24/20 0309 03/25/20 0233   NA 134* 136 138 139  K 4.2 4.3 3.7 4.0  CL 102 103 104 102  CO2 22 22 25 28   GLUCOSE 100* 74 86 89  BUN 15 13 13 14   CREATININE 1.10 0.99 1.13 1.19  CALCIUM 8.6* 8.9 8.5* 8.5*  MG  --  2.2  --   --    Liver Function Tests: Recent Labs  Lab 03/22/20 2011  AST 21  ALT 14  ALKPHOS 48  BILITOT 0.7  PROT 6.4*  ALBUMIN 3.1*   Recent Labs  Lab 03/22/20 2011  LIPASE 22   No results for input(s): AMMONIA in the last 168 hours. CBC: Recent Labs  Lab 03/22/20 2011 03/23/20 0418 03/24/20 0309 03/25/20 0233  WBC 4.5 4.2 3.9* 4.0  HGB 12.0* 12.3* 12.0* 10.9*  HCT 39.5 41.5 38.5* 35.6*  MCV 93.4 94.1 91.7 92.5  PLT 186 177 173 154   Cardiac Enzymes: No results for  input(s): CKTOTAL, CKMB, CKMBINDEX, TROPONINI in the last 168 hours. BNP: Invalid input(s): POCBNP CBG: No results for input(s): GLUCAP in the last 168 hours. D-Dimer No results for input(s): DDIMER in the last 72 hours. Hgb A1c No results for input(s): HGBA1C in the last 72 hours. Lipid Profile No results for input(s): CHOL, HDL, LDLCALC, TRIG, CHOLHDL, LDLDIRECT in the last 72 hours. Thyroid function studies No results for input(s): TSH, T4TOTAL, T3FREE, THYROIDAB in the last 72 hours.  Invalid input(s): FREET3 Anemia work up No results for input(s): VITAMINB12, FOLATE, FERRITIN, TIBC, IRON, RETICCTPCT in the last 72 hours. Urinalysis    Component Value Date/Time   COLORURINE AMBER (A) 03/22/2020 1945   APPEARANCEUR HAZY (A) 03/22/2020 1945   LABSPEC 1.028 03/22/2020 1945   PHURINE 5.0 03/22/2020 1945   GLUCOSEU NEGATIVE 03/22/2020 1945   HGBUR NEGATIVE 03/22/2020 1945   BILIRUBINUR NEGATIVE 03/22/2020 1945   KETONESUR 5 (A) 03/22/2020 1945   PROTEINUR 100 (A) 03/22/2020 1945   UROBILINOGEN 2.0 (H) 02/04/2013 0200   NITRITE NEGATIVE 03/22/2020 1945   LEUKOCYTESUR NEGATIVE 03/22/2020 1945   Sepsis Labs Invalid input(s): PROCALCITONIN,  WBC,  LACTICIDVEN Microbiology Recent Results (from the  past 240 hour(s))  Respiratory Panel by RT PCR (Flu A&B, Covid) - Nasopharyngeal Swab     Status: None   Collection Time: 03/22/20  7:46 PM   Specimen: Nasopharyngeal Swab  Result Value Ref Range Status   SARS Coronavirus 2 by RT PCR NEGATIVE NEGATIVE Final    Comment: (NOTE) SARS-CoV-2 target nucleic acids are NOT DETECTED.  The SARS-CoV-2 RNA is generally detectable in upper respiratoy specimens during the acute phase of infection. The lowest concentration of SARS-CoV-2 viral copies this assay can detect is 131 copies/mL. A negative result does not preclude SARS-Cov-2 infection and should not be used as the sole basis for treatment or other patient management decisions. A negative result may occur with  improper specimen collection/handling, submission of specimen other than nasopharyngeal swab, presence of viral mutation(s) within the areas targeted by this assay, and inadequate number of viral copies (<131 copies/mL). A negative result must be combined with clinical observations, patient history, and epidemiological information. The expected result is Negative.  Fact Sheet for Patients:  PinkCheek.be  Fact Sheet for Healthcare Providers:  GravelBags.it  This test is no t yet approved or cleared by the [place] FDA and  has been authorized for detection and/or diagnosis of SARS-CoV-2 by FDA under an Emergency Use Authorization (EUA). This EUA will remain  in effect (meaning this test can be used) for the duration of the COVID-19 declaration under Section 564(b)(1) of the Act, 21 U.S.C. section 360bbb-3(b)(1), unless the authorization is terminated or revoked sooner.     Influenza A by PCR NEGATIVE NEGATIVE Final   Influenza B by PCR NEGATIVE NEGATIVE Final    Comment: (NOTE) The Xpert Xpress SARS-CoV-2/FLU/RSV assay is intended as an aid in  the diagnosis of influenza from Nasopharyngeal swab specimens and   should not be used as a sole basis for treatment. Nasal washings and  aspirates are unacceptable for Xpert Xpress SARS-CoV-2/FLU/RSV  testing.  Fact Sheet for Patients: PinkCheek.be  Fact Sheet for Healthcare Providers: GravelBags.it  This test is not yet approved or cleared by the [place] FDA and  has been authorized for detection and/or diagnosis of SARS-CoV-2 by  FDA under an Emergency Use Authorization (EUA). This EUA will remain  in effect (meaning this test can be used) for the duration of the  Covid-19 declaration under Section 564(b)(1) of the Act, 21  U.S.C. section 360bbb-3(b)(1), unless the authorization is  terminated or revoked. Performed at [facility], [place] [address] [place],  [address]      Time coordinating discharge: Over 30 minutes  SIGNED:   Darliss Cheney, MD  [facility] 03/25/2020, 8:46 AM  If 7PM-7AM, please contact night-coverage [email or web address]

## 2020-03-25 NOTE — Progress Notes (Signed)
[facility] for heparin >> apixaban Indication: pulmonary embolus, afib history  No Known Allergies  Patient Measurements: Height: 6' (182.9 cm) Weight: 65.9 kg (145 lb 3.2 oz) IBW/kg (Calculated) : 77.6  Vital Signs: Temp: 98.3 F (36.8 C) (10/22 0437) Temp Source: Oral (10/22 0437) BP: 99/65 (10/22 0437) Pulse Rate: 66 (10/22 0437)  Labs: Recent Labs     0000 03/22/20 2011 03/23/20 0158 03/23/20 0418 03/23/20 1308 03/23/20 2244 03/24/20 0309 03/24/20 1001 03/25/20 0233  HGB   < >   < >  --  12.3*  --   --  12.0*  --  10.9*  HCT  --    < >  --  41.5  --   --  38.5*  --  35.6*  PLT  --    < >  --  177  --   --  173  --  154  APTT  --   --   --   --  53*   < > 100* 94* 119*  HEPARINUNFRC  --   --   --   --  1.86*  --  1.52*  --  1.30*  CREATININE  --    < >  --  0.99  --   --  1.13  --  1.19  TROPONINIHS  --   --  26* 27*  --   --   --   --   --    < > = values in this interval not displayed.    Estimated Creatinine Clearance: 50.8 mL/min (by C-G formula based on SCr of 1.19 mg/dL).  Assessment: 74yo male c/o nausea, anorexia, chills, SOB, and general weakness, troponin mildly elevated but CT reveals bilateral PE. He is on apixaban PTA for history of afib. He states he is compliant but recently ran out and was off of it for a few days. Transitioned to IV heparin.   Pharmacy consulted to transition back to apixaban. Hg down to 10.9, plt wnl. No active bleeding issues noted.  Goal of Therapy:  VTE treatment, stroke prevention Monitor platelets by anticoagulation protocol: Yes   Plan:  D/c heparin at time of 1st dose of apixaban - communicated plan with RN Apixaban 10mg  PO BID x 7 days; then 5mg  PO BID Monitor CBC, s/sx bleeding   Arturo Morton, PharmD, BCPS Please check AMION for all [place] contact numbers Clinical Pharmacist 03/25/2020 8:23 AM

## 2020-03-25 NOTE — Progress Notes (Signed)
D/C instructions given and reviewed. No questions asked but encouraged to call with any concerns. Tele and IV removed, tolerated well. 

## 2020-03-25 NOTE — Discharge Instructions (Signed)
Information on my medicine - ELIQUIS (apixaban)  This medication education was reviewed with me or my healthcare representative as part of my discharge preparation.  Why was Eliquis prescribed for you? Eliquis was prescribed to treat blood clots that may have been found in the veins of your legs (deep vein thrombosis) or in your lungs (pulmonary embolism) and to reduce the risk of them occurring again and also to prevent stroke due to atrial fibrillation.  What do You need to know about Eliquis ? The starting dose is 10 mg (two 5 mg tablets) taken TWICE daily for the FIRST SEVEN (7) DAYS, then on (enter date)  04/01/2020  the dose is reduced to ONE 5 mg tablet taken TWICE daily.  Eliquis may be taken with or without food.   Try to take the dose about the same time in the morning and in the evening. If you have difficulty swallowing the tablet whole please discuss with your pharmacist how to take the medication safely.  Take Eliquis exactly as prescribed and DO NOT stop taking Eliquis without talking to the doctor who prescribed the medication.  Stopping may increase your risk of developing a new blood clot.  Refill your prescription before you run out.  After discharge, you should have regular check-up appointments with your healthcare provider that is prescribing your Eliquis.    What do you do if you miss a dose? If a dose of ELIQUIS is not taken at the scheduled time, take it as soon as possible on the same day and twice-daily administration should be resumed. The dose should not be doubled to make up for a missed dose.  Important Safety Information A possible side effect of Eliquis is bleeding. You should call your healthcare provider right away if you experience any of the following: ? Bleeding from an injury or your nose that does not stop. ? Unusual colored urine (red or dark brown) or unusual colored stools (red or black). ? Unusual bruising for unknown reasons. ? A serious  fall or if you hit your head (even if there is no bleeding).  Some medicines may interact with Eliquis and might increase your risk of bleeding or clotting while on Eliquis. To help avoid this, consult your healthcare provider or pharmacist prior to using any new prescription or non-prescription medications, including herbals, vitamins, non-steroidal anti-inflammatory drugs (NSAIDs) and supplements.  This website has more information on Eliquis (apixaban): http://www.eliquis.com/eliquis/home

## 2020-03-25 NOTE — Plan of Care (Signed)
  Problem: Activity: Goal: Ability to tolerate increased activity will improve Outcome: Adequate for Discharge Goal: Will verbalize the importance of balancing activity with adequate rest periods Outcome: Adequate for Discharge   Problem: Respiratory: Goal: Levels of oxygenation will improve Outcome: Adequate for Discharge   Problem: Education: Goal: Knowledge of General Education information will improve Description: Including pain rating scale, medication(s)/side effects and non-pharmacologic comfort measures Outcome: Adequate for Discharge   Problem: Health Behavior/Discharge Planning: Goal: Ability to manage health-related needs will improve Outcome: Adequate for Discharge   Problem: Clinical Measurements: Goal: Ability to maintain clinical measurements within normal limits will improve Outcome: Adequate for Discharge Goal: Will remain free from infection Outcome: Adequate for Discharge Goal: Diagnostic test results will improve Outcome: Adequate for Discharge Goal: Respiratory complications will improve Outcome: Adequate for Discharge Goal: Cardiovascular complication will be avoided Outcome: Adequate for Discharge   Problem: Activity: Goal: Risk for activity intolerance will decrease Outcome: Adequate for Discharge   Problem: Nutrition: Goal: Adequate nutrition will be maintained Outcome: Adequate for Discharge   Problem: Coping: Goal: Level of anxiety will decrease Outcome: Adequate for Discharge   Problem: Elimination: Goal: Will not experience complications related to bowel motility Outcome: Adequate for Discharge Goal: Will not experience complications related to urinary retention Outcome: Adequate for Discharge   Problem: Pain Managment: Goal: General experience of comfort will improve Outcome: Adequate for Discharge   Problem: Safety: Goal: Ability to remain free from injury will improve Outcome: Adequate for Discharge   Problem: Skin  Integrity: Goal: Risk for impaired skin integrity will decrease Outcome: Adequate for Discharge   Problem: Education: Goal: Ability to demonstrate management of disease process will improve Outcome: Adequate for Discharge Goal: Ability to verbalize understanding of medication therapies will improve Outcome: Adequate for Discharge Goal: Individualized Educational Video(s) Outcome: Adequate for Discharge   Problem: Activity: Goal: Capacity to carry out activities will improve Outcome: Adequate for Discharge   Problem: Cardiac: Goal: Ability to achieve and maintain adequate cardiopulmonary perfusion will improve Outcome: Adequate for Discharge

## 2020-03-25 NOTE — Telephone Encounter (Signed)
Transition Care Management Unsuccessful Follow-up Telephone Call  Date of discharge and from where:  03/25/2020, [facility]  Attempts:  1st Attempt  Reason for unsuccessful TCM follow-up call:  Left voice message

## 2020-03-25 NOTE — TOC Initial Note (Signed)
Transition of Care [facility]) - Initial/Assessment Note    Patient Details  Name: Cody Arellano MRN: [id number] Date of Birth: 02/27/1946  [facility] CM/SW Contact:    Zenon Mayo, RN Phone Number: 03/25/2020, 10:01 AM  Clinical Narrative:                 Patient is from home with wife, he states he does a brisk walk for about 10 mins a day.  He has no issues with getting meds and his wife will be transporting him home. He states he does not want HHRN for CHF.  He has no other needs.  Expected Discharge Plan: Home/Self Care Barriers to Discharge: No Barriers Identified   Patient Goals and CMS Choice Patient states their goals for this hospitalization and ongoing recovery are:: get back to normal, travel   Choice offered to / list presented to : NA  Expected Discharge Plan and Services Expected Discharge Plan: Home/Self Care In-house Referral: NA Discharge Planning Services: CM Consult Post Acute Care Choice: NA Living arrangements for the past 2 months: Single Family Home Expected Discharge Date: 03/25/20                 DME Agency: NA       HH Arranged: NA          Prior Living Arrangements/Services Living arrangements for the past 2 months: Single Family Home Lives with:: Spouse Patient language and need for interpreter reviewed:: Yes Do you feel safe going back to the place where you live?: Yes      Need for Family Participation in Patient Care: Yes (Comment) Care giver support system in place?: Yes (comment)   Criminal Activity/Legal Involvement Pertinent to Current Situation/Hospitalization: No - Comment as needed  Activities of Daily Living Home Assistive Devices/Equipment: Hearing aid, Eyeglasses (glasses to drive only) ADL Screening (condition at time of admission) Patient's cognitive ability adequate to safely complete daily activities?: Yes Is the patient deaf or have difficulty hearing?: Yes Does the patient have difficulty  seeing, even when wearing glasses/contacts?: No Does the patient have difficulty concentrating, remembering, or making decisions?: No Patient able to express need for assistance with ADLs?: Yes Does the patient have difficulty dressing or bathing?: No Independently performs ADLs?: Yes (appropriate for developmental age) Does the patient have difficulty walking or climbing stairs?: No Weakness of Legs: None Weakness of Arms/Hands: None  Permission Sought/Granted                  Emotional Assessment Appearance:: Appears stated age Attitude/Demeanor/Rapport: Engaged Affect (typically observed): Appropriate Orientation: : Oriented to Place, Oriented to  Time, Oriented to Situation, Oriented to Self Alcohol / Substance Use: Not Applicable Psych Involvement: No (comment)  Admission diagnosis:  Acute pulmonary edema ([place]) [J81.0] Pulmonary embolism ([place]) [I26.99] Acute pulmonary embolism, unspecified pulmonary embolism type, unspecified whether acute cor pulmonale present ([place]) [I26.99] Patient Active Problem List   Diagnosis Date Noted  . Pulmonary embolism ([place]) 03/23/2020  . Heart failure, systolic, chronic ([place]) 29/52/8413  . Fatigue 03/09/2020  . Atherosclerotic heart disease of native coronary artery with angina pectoris ([place]) 03/09/2020  . Protein-calorie malnutrition, severe ([place]) 03/09/2020  . Hypotension 01/29/2020  . Anemia 01/29/2020  . Prostatic hypertrophy 01/29/2020  . Pancreatic lesion 01/29/2020  . Rectal abscess 01/28/2020  . 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 Paroxysmal atrial fibrillation ([place]) 01/20/2019  . Chronic tension headache   . Preventative health care 01/09/2018  . Memory  loss 01/09/2018  . Advance directive discussed with patient 01/09/2018  . Hyperlipemia 01/17/2016  . 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 Personal history of colonic polyps 05/20/2013  . Episodic mood disorder ([place]) 05/13/2013  . Systemic lupus  erythematosus ([place]) 05/13/2013  . Colon polyps   . CAD (coronary artery disease) 04/09/2013  . 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 ILD (interstitial lung disease) ([place]) 02/04/2013   PCP:  Venia Carbon, MD Pharmacy:   [id number] [place], [place] [address] Phone: [phone] Fax: [place], [place] - [place] [phone] [place] [address] Phone: [phone] Fax: [phone]  [name] [place], [place] - [address] [place] [address] Phone: [phone] Fax: [phone]     Social Determinants of Health (SDOH) Interventions Food Insecurity Interventions: Intervention Not Indicated Physical Activity Interventions: Intervention Not Indicated Transportation Interventions: Intervention Not Indicated  Readmission Risk Interventions Readmission Risk Prevention Plan 03/25/2020  Transportation Screening Complete  PCP or Specialist Appt within 3-5 Days Complete  HRI or Home Care Consult Complete  Social Work Consult for [place] Planning/Counseling Complete  Palliative Care Screening Not Applicable  Medication Review Press photographer) Complete  Some recent data might be hidden

## 2020-03-28 NOTE — Telephone Encounter (Signed)
Transition Care Management Unsuccessful Follow-up Telephone Call  Date of discharge and from where:  [facility]/25/2020, [name]  Attempts:  2nd Attempt  Reason for unsuccessful TCM follow-up call:  Left voice message

## 2020-04-06 ENCOUNTER — Observation Stay (HOSPITAL_COMMUNITY)
Admission: EM | Admit: 2020-04-06 | Discharge: 2020-04-07 | Disposition: A | Discharge status: Home / Self Care | Payer: No Typology Code available for payment source | Attending: Internal Medicine | Admitting: Internal Medicine

## 2020-04-06 ENCOUNTER — Other Ambulatory Visit: Payer: Self-pay

## 2020-04-06 ENCOUNTER — Emergency Department (HOSPITAL_COMMUNITY): Payer: No Typology Code available for payment source

## 2020-04-06 ENCOUNTER — Encounter (HOSPITAL_COMMUNITY): Payer: Self-pay | Admitting: Emergency Medicine

## 2020-04-06 DIAGNOSIS — Z20822 Contact with and (suspected) exposure to covid-19: Secondary | ICD-10-CM | POA: Insufficient documentation

## 2020-04-06 DIAGNOSIS — Z7982 Long term (current) use of aspirin: Secondary | ICD-10-CM | POA: Diagnosis not present

## 2020-04-06 DIAGNOSIS — I480 Paroxysmal atrial fibrillation: Secondary | ICD-10-CM | POA: Diagnosis not present

## 2020-04-06 DIAGNOSIS — I2510 Atherosclerotic heart disease of native coronary artery without angina pectoris: Secondary | ICD-10-CM | POA: Insufficient documentation

## 2020-04-06 DIAGNOSIS — I110 Hypertensive heart disease with heart failure: Secondary | ICD-10-CM | POA: Diagnosis not present

## 2020-04-06 DIAGNOSIS — Z79899 Other long term (current) drug therapy: Secondary | ICD-10-CM | POA: Diagnosis not present

## 2020-04-06 DIAGNOSIS — Z87891 Personal history of nicotine dependence: Secondary | ICD-10-CM | POA: Insufficient documentation

## 2020-04-06 DIAGNOSIS — I5023 Acute on chronic systolic (congestive) heart failure: Secondary | ICD-10-CM | POA: Diagnosis not present

## 2020-04-06 DIAGNOSIS — Z7901 Long term (current) use of anticoagulants: Secondary | ICD-10-CM | POA: Diagnosis not present

## 2020-04-06 DIAGNOSIS — Z951 Presence of aortocoronary bypass graft: Secondary | ICD-10-CM | POA: Diagnosis not present

## 2020-04-06 DIAGNOSIS — Z86010 Personal history of colonic polyps: Secondary | ICD-10-CM | POA: Diagnosis not present

## 2020-04-06 DIAGNOSIS — I509 Heart failure, unspecified: Secondary | ICD-10-CM

## 2020-04-06 DIAGNOSIS — R0602 Shortness of breath: Secondary | ICD-10-CM | POA: Diagnosis present

## 2020-04-06 DIAGNOSIS — I2699 Other pulmonary embolism without acute cor pulmonale: Secondary | ICD-10-CM | POA: Diagnosis present

## 2020-04-06 DIAGNOSIS — M329 Systemic lupus erythematosus, unspecified: Secondary | ICD-10-CM | POA: Diagnosis present

## 2020-04-06 HISTORY — DX: Heart failure, unspecified: I50.9

## 2020-04-06 LAB — CBC
HCT: 41.2 % (ref 39.0–52.0)
Hemoglobin: 12.5 g/dL — ABNORMAL LOW (ref 13.0–17.0)
MCH: 27.5 pg (ref 26.0–34.0)
MCHC: 30.3 g/dL (ref 30.0–36.0)
MCV: 90.7 fL (ref 80.0–100.0)
Platelets: 219 10*3/uL (ref 150–400)
RBC: 4.54 MIL/uL (ref 4.22–5.81)
RDW: 17.9 % — ABNORMAL HIGH (ref 11.5–15.5)
WBC: 4.8 10*3/uL (ref 4.0–10.5)
nRBC: 0 % (ref 0.0–0.2)

## 2020-04-06 LAB — BASIC METABOLIC PANEL
Anion gap: 10 (ref 5–15)
BUN: 17 mg/dL (ref 8–23)
CO2: 25 mmol/L (ref 22–32)
Calcium: 9 mg/dL (ref 8.9–10.3)
Chloride: 104 mmol/L (ref 98–111)
Creatinine, Ser: 1.11 mg/dL (ref 0.61–1.24)
GFR, Estimated: >60 mL/min (ref >60)
Glucose, Bld: 117 mg/dL — ABNORMAL HIGH (ref 70–99)
Potassium: 4.1 mmol/L (ref 3.5–5.1)
Sodium: 139 mmol/L (ref 135–145)

## 2020-04-06 LAB — PROTIME-INR
INR: 1.7 — ABNORMAL HIGH (ref 0.8–1.2)
Prothrombin Time: 19 seconds — ABNORMAL HIGH (ref 11.4–15.2)

## 2020-04-06 LAB — BRAIN NATRIURETIC PEPTIDE: B Natriuretic Peptide: 1677.6 pg/mL — ABNORMAL HIGH (ref 0.0–100.0)

## 2020-04-06 LAB — TROPONIN I (HIGH SENSITIVITY): Troponin I (High Sensitivity): 28 ng/L — ABNORMAL HIGH (ref <18)

## 2020-04-06 MED ORDER — FUROSEMIDE 10 MG/ML IJ SOLN
40.0000 mg | Freq: Once | INTRAMUSCULAR | Status: AC
  Administered 2020-04-06: 40 mg via INTRAVENOUS
  Filled 2020-04-06: qty 4

## 2020-04-06 NOTE — ED Provider Notes (Addendum)
[facility] EMERGENCY DEPARTMENT Provider Note   CSN: [id number] Arrival date & time: 04/06/20  2107     History Chief Complaint  Patient presents with  . Shortness of Breath    CHF/PE    Cody Arellano is a 74 y.o. male.  HPI  Patient presents with shortness of breath.  Has had for around the last week.  Discharged just over a week ago with admission to the hospital with CHF and pulmonary embolism.  PEs occurred while he been off his anticoagulation.  States he is continually been on his Eliquis since he left the hospital.  States he has had a cough and feels as if he has extra fluid again.  States his weight is up about 2 pounds.  States he does not have swelling in his legs.  No fevers.  No chills.  States he would not be able to walk to the door now without getting short of breath.  Not having chest pain.  Also has a history of interstitial lung disease.     Past Medical History:  Diagnosis Date  . Anxiety   . CHF (congestive heart failure) ([place])   . Chronic tension headache   . Colon polyps   . Coronary artery disease 2008/2009   MI with PCI x 2, then PCI x 1 in 2009  . Depression   . Dyslipidemia   . Dysrhythmia    atrial fibrillation  . Hyperlipidemia   . Hypertension   . Lupus [facility])    sees Dr Trudie Reed  . Lupus disease of the lung   . 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 Myocardial infarction ([place]) 2008  . 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 Shortness of breath     Patient Active Problem List   Diagnosis Date Noted  . Pulmonary embolism ([place]) 03/23/2020  . Heart failure, systolic, chronic ([place]) 17/51/0258  . Fatigue 03/09/2020  . Atherosclerotic heart disease of native coronary artery with angina pectoris ([place]) 03/09/2020  . Protein-calorie malnutrition, severe ([place]) 03/09/2020  . Hypotension 01/29/2020  . Anemia 01/29/2020  . Prostatic hypertrophy 01/29/2020  . Pancreatic lesion  01/29/2020  . Rectal abscess 01/28/2020  . 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 Paroxysmal atrial fibrillation ([place]) 01/20/2019  . Chronic tension headache   . Preventative health care 01/09/2018  . Memory loss 01/09/2018  . Advance directive discussed with patient 01/09/2018  . Hyperlipemia 01/17/2016  . 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 Personal history of colonic polyps 05/20/2013  . Episodic mood disorder ([place]) 05/13/2013  . Systemic lupus erythematosus ([place]) 05/13/2013  . Colon polyps   . CAD (coronary artery disease) 04/09/2013  . 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 ILD (interstitial lung disease) ([place]) 02/04/2013    Past Surgical History:  Procedure Laterality Date  . CARDIAC CATHETERIZATION    . CLIPPING OF ATRIAL APPENDAGE N/A 10/17/2019   Procedure: Clipping Of Atrial Appendage using AtriCure [id number] 45 MM AtriClip.;  Surgeon: Rexene Alberts, MD;  Location: [place];  Service: Open Heart Surgery;  Laterality: N/A;  . CORONARY ARTERY BYPASS GRAFT N/A 10/17/2019   Procedure: CORONARY ARTERY BYPASS GRAFTING (CABG) using LIMA to LAD; Endoscopic harvest right greater saphenous vein: SVG to RAMUS.;  Surgeon: Rexene Alberts, MD;  Location: [place];  Service: Open Heart Surgery;  Laterality: N/A;  .  CORONARY BALLOON ANGIOPLASTY N/A 10/17/2019   Procedure: CORONARY BALLOON ANGIOPLASTY;  Surgeon: Nelva Bush, MD;  Location: [place] CV LAB;  Service: Cardiovascular;  Laterality: N/A;  . coronary stents  2009  . CORONARY/GRAFT ACUTE MI REVASCULARIZATION N/A 10/17/2019   Procedure: Coronary/Graft Acute MI Revascularization;  Surgeon: Nelva Bush, MD;  Location: [place] CV LAB;  Service: Cardiovascular;  Laterality: N/A;  . ENDOVEIN HARVEST OF GREATER SAPHENOUS VEIN Right 10/17/2019   Procedure: [place] Of Greater Saphenous Vein;  Surgeon: Rexene Alberts, MD;  Location: [place];  Service: Open  Heart Surgery;  Laterality: Right;  . IABP INSERTION N/A 10/17/2019   Procedure: IABP Insertion;  Surgeon: Nelva Bush, MD;  Location: [place] CV LAB;  Service: Cardiovascular;  Laterality: N/A;  . INCISION AND DRAINAGE ABSCESS N/A 01/29/2020   Procedure: INCISION AND DRAINAGE BILATERAL PERIRECTAL ABSCESS;  Surgeon: Stark Klein, MD;  Location: [place];  Service: General;  Laterality: N/A;  . IR THORACENTESIS ASP PLEURAL SPACE W/IMG GUIDE  10/26/2019  . RIGHT/LEFT HEART CATH AND CORONARY ANGIOGRAPHY N/A 10/17/2019   Procedure: RIGHT/LEFT HEART CATH AND CORONARY ANGIOGRAPHY;  Surgeon: Nelva Bush, MD;  Location: [place] CV LAB;  Service: Cardiovascular;  Laterality: N/A;  . TEE WITHOUT CARDIOVERSION  10/17/2019   Procedure: Transesophageal Echocardiogram ([place]);  Surgeon: Rexene Alberts, MD;  Location: [facility] OR;  Service: Open Heart Surgery;;       Family History  Problem Relation Age of Onset  . Heart disease Mother   . Alcohol abuse Father   . Hyperlipidemia Sister   . Hypertension Sister   . Hyperlipidemia Brother   . Hypertension Brother   . Cancer Brother        ?lung cancer  . Stomach cancer Brother   . Diabetes Paternal Uncle   . Colon cancer Neg Hx   . Esophageal cancer Neg Hx   . Rectal cancer Neg Hx     Social History   Tobacco Use  . Smoking status: Former Smoker    Packs/day: 1.00    Years: 20.00    Pack years: 20.00    Types: Cigarettes    Quit date: 02/26/1993    Years since quitting: 27.1  . Smokeless tobacco: Never Used  . Tobacco comment: QUIT SMOKING 20 YEARS AGO  Vaping Use  . Vaping Use: Never used  Substance Use Topics  . Alcohol use: No  . Drug use: No    Home Medications Prior to Admission medications   Medication Sig Start Date End Date Taking? Authorizing Provider  apixaban (ELIQUIS) 5 MG TABS tablet Take 1 tablet (5 mg total) by mouth 2 (two) times daily. 04/01/20 05/01/20 Yes Pahwani, Einar Grad, MD  aspirin EC 81 MG tablet Take 1  tablet (81 mg total) by mouth daily. 08/10/14  Yes Nahser, Wonda Cheng, MD  azaTHIOprine (IMURAN) 50 MG tablet Take 100 mg by mouth 2 (two) times daily.  10/11/14  Yes [provider]  furosemide (LASIX) 40 MG tablet Take 40 mg by mouth daily.   Yes [provider]  hydroxychloroquine (PLAQUENIL) 200 MG tablet Take 400 mg by mouth at bedtime.    Yes [provider]  oxyCODONE (OXY IR/ROXICODONE) 5 MG immediate release tablet Take 1 tablet (5 mg total) by mouth every 6 (six) hours as needed for severe pain. 02/02/20  Yes Rai, Ripudeep K, MD  polyethylene glycol (MIRALAX / GLYCOLAX) 17 g packet Take 17 g by mouth daily as needed for moderate constipation.  Yes [provider]  polyvinyl alcohol (ARTIFICIAL TEARS) 1.4 % ophthalmic solution Place 2 drops into both eyes daily.    Yes [provider]  rosuvastatin (CRESTOR) 20 MG tablet Take 20 mg by mouth daily.   Yes [provider]  tamsulosin (FLOMAX) 0.4 MG CAPS capsule Take 0.4 mg by mouth every evening.  04/02/17  Yes [provider]  apixaban (ELIQUIS) 5 MG TABS tablet Take 2 tablets (10 mg total) by mouth 2 (two) times daily for 7 days. Patient not taking: Reported on 04/06/2020 03/25/20 04/01/20  Darliss Cheney, MD    Allergies    Patient has no known allergies.  Review of Systems   Review of Systems  Constitutional: Negative for appetite change.  HENT: Negative for congestion.   Respiratory: Positive for cough and shortness of breath.   Cardiovascular: Negative for chest pain and leg swelling.  Genitourinary: Negative for flank pain.  Musculoskeletal: Negative for back pain.  Skin: Negative for rash.  Neurological: Negative for weakness.  Psychiatric/Behavioral: Negative for confusion.    Physical Exam Updated Vital Signs BP 119/79   Pulse 71   Temp (!) 97.4 F (36.3 C) (Oral)   Resp (!) 28   Ht 6' (1.829 m)   Wt 75 kg   SpO2 96%   BMI 22.42 kg/m   Physical  Exam Vitals and nursing note reviewed.  HENT:     Head: Normocephalic.  Eyes:     Pupils: Pupils are equal, round, and reactive to light.  Neck:     Vascular: JVD present.  Cardiovascular:     Rate and Rhythm: Normal rate and regular rhythm.  Pulmonary:     Breath sounds: No wheezing or rhonchi.     Comments: Some rales at the bases. Chest:     Chest wall: No mass.  Abdominal:     Tenderness: There is no abdominal tenderness.  Musculoskeletal:     Right lower leg: No edema.     Left lower leg: No edema.  Skin:    General: Skin is warm.     Capillary Refill: Capillary refill takes less than 2 seconds.  Neurological:     Mental Status: He is alert and oriented to person, place, and time.     ED Results / Procedures / Treatments   Labs (all labs ordered are listed, but only abnormal results are displayed) Labs Reviewed  BASIC METABOLIC PANEL - Abnormal; Notable for the following components:      Result Value   Glucose, Bld 117 (*)    All other components within normal limits  CBC - Abnormal; Notable for the following components:   Hemoglobin 12.5 (*)    RDW 17.9 (*)    All other components within normal limits  PROTIME-INR - Abnormal; Notable for the following components:   Prothrombin Time 19.0 (*)    INR 1.7 (*)    All other components within normal limits  BRAIN NATRIURETIC PEPTIDE - Abnormal; Notable for the following components:   B Natriuretic Peptide 1,677.6 (*)    All other components within normal limits  TROPONIN I (HIGH SENSITIVITY) - Abnormal; Notable for the following components:   Troponin I (High Sensitivity) 28 (*)    All other components within normal limits  TROPONIN I (HIGH SENSITIVITY)    EKG EKG Interpretation  Date/Time:  Wednesday April 06 2020 21:16:39 EDT Ventricular Rate:  76 PR Interval:  182 QRS Duration: 84 QT Interval:  390 QTC Calculation: 438 R Axis:   -  54 Text Interpretation: Normal sinus rhythm Left axis deviation  Anteroseptal infarct , age undetermined Abnormal ECG Confirmed by Davonna Belling [phone]) on 04/06/2020 9:55:38 PM   Radiology DG Chest 2 View  Result Date: 04/06/2020 CLINICAL DATA:  Shortness of breath EXAM: CHEST - 2 VIEW COMPARISON:  03/23/2020 FINDINGS: Cardiac shadow is mildly enlarged. Postsurgical changes are again noted and stable. Aortic calcifications are again seen. Patchy airspace opacities again noted in the bases bilaterally similar to that noted on prior CT examination. Previously seen effusions have resolved in the interval. IMPRESSION: Stable bibasilar airspace opacity similar to that seen on prior CT. Previously seen effusions have resolved. Electronically Signed   By: Inez Catalina M.D.   On: 04/06/2020 21:40    Procedures Procedures (including critical care time)  Medications Ordered in ED Medications  furosemide (LASIX) injection 40 mg (40 mg Intravenous Given 04/06/20 2256)    ED Course  I have reviewed the triage vital signs and the nursing notes.  Pertinent labs & imaging results that were available during my care of the patient were reviewed by me and considered in my medical decision making (see chart for details).    MDM Rules/Calculators/A&P                          Patient presents with shortness of breath.  Happened after leaving hospital.  Had recent admission for CHF and pulmonary embolism.  Has JVD and elevated BNP.  I think this is primarily CHF.  Pulmonary embolism felt less likely particularly since patient has been consistently on the Eliquis since leaving the hospital.  However has had around 500 cc urine out and feels mildly improved but still rather dyspneic.  I feels patient benefit from [place] the hospital.  If does not improve significantly may need repeat CT scan to evaluate for pulmonary embolism.  Will discuss with hospitalist.  I have reviewed imaging blood work and EKG.  I have interpreted the results.  Differential diagnosis included  pneumonia, CHF, pulmonary medicine, pneumothorax, Covid,  Final Clinical Impression(s) / ED Diagnoses Final diagnoses:  Acute on chronic congestive heart failure, unspecified heart failure type [facility])    Rx / DC Orders ED Discharge Orders    None       Davonna Belling, MD 04/07/20 Dyann Kief    Davonna Belling, MD 04/07/20 0003

## 2020-04-06 NOTE — ED Triage Notes (Signed)
Patient reports SOB with chest tightness onset last week with productive cough , patient stated history of CHF and PE , increased his diuretic by MD with no improvement .

## 2020-04-07 ENCOUNTER — Encounter (HOSPITAL_COMMUNITY): Payer: Self-pay | Admitting: Family Medicine

## 2020-04-07 DIAGNOSIS — I2510 Atherosclerotic heart disease of native coronary artery without angina pectoris: Secondary | ICD-10-CM

## 2020-04-07 DIAGNOSIS — I2699 Other pulmonary embolism without acute cor pulmonale: Secondary | ICD-10-CM

## 2020-04-07 DIAGNOSIS — M3213 Lung involvement in systemic lupus erythematosus: Secondary | ICD-10-CM

## 2020-04-07 DIAGNOSIS — I480 Paroxysmal atrial fibrillation: Secondary | ICD-10-CM

## 2020-04-07 DIAGNOSIS — I5023 Acute on chronic systolic (congestive) heart failure: Secondary | ICD-10-CM | POA: Diagnosis present

## 2020-04-07 LAB — RESPIRATORY PANEL BY RT PCR (FLU A&B, COVID)
Influenza A by PCR: NEGATIVE
Influenza B by PCR: NEGATIVE
SARS Coronavirus 2 by RT PCR: NEGATIVE

## 2020-04-07 LAB — TROPONIN I (HIGH SENSITIVITY): Troponin I (High Sensitivity): 25 ng/L — ABNORMAL HIGH (ref <18)

## 2020-04-07 MED ORDER — FUROSEMIDE 40 MG PO TABS
ORAL_TABLET | ORAL | Status: DC
Start: 2020-04-07 — End: 2020-05-23

## 2020-04-07 MED ORDER — OXYCODONE HCL 5 MG PO TABS: 5.0000 mg | ORAL_TABLET | Freq: Four times a day (QID) | ORAL | Status: DC | PRN

## 2020-04-07 MED ORDER — SODIUM CHLORIDE 0.9% FLUSH
3.0000 mL | INTRAVENOUS | Status: DC | PRN
  Administered 2020-04-07: 3 mL via INTRAVENOUS

## 2020-04-07 MED ORDER — POLYVINYL ALCOHOL 1.4 % OP SOLN
2.0000 [drp] | Freq: Every day | OPHTHALMIC | Status: DC
  Filled 2020-04-07: qty 15

## 2020-04-07 MED ORDER — ONDANSETRON HCL 4 MG/2ML IJ SOLN: 4.0000 mg | Freq: Four times a day (QID) | INTRAMUSCULAR | Status: DC | PRN

## 2020-04-07 MED ORDER — HYDROXYCHLOROQUINE SULFATE 200 MG PO TABS
400.0000 mg | ORAL_TABLET | Freq: Every day | ORAL | Status: DC
  Filled 2020-04-07: qty 2

## 2020-04-07 MED ORDER — SODIUM CHLORIDE 0.9% FLUSH: 3.0000 mL | Freq: Two times a day (BID) | INTRAVENOUS | Status: DC

## 2020-04-07 MED ORDER — APIXABAN 5 MG PO TABS
5.0000 mg | ORAL_TABLET | Freq: Two times a day (BID) | ORAL | Status: DC
  Administered 2020-04-07: 5 mg via ORAL
  Filled 2020-04-07: qty 1

## 2020-04-07 MED ORDER — TAMSULOSIN HCL 0.4 MG PO CAPS: 0.4000 mg | ORAL_CAPSULE | Freq: Every evening | ORAL | Status: DC

## 2020-04-07 MED ORDER — ACETAMINOPHEN 325 MG PO TABS: 650.0000 mg | ORAL_TABLET | ORAL | Status: DC | PRN

## 2020-04-07 MED ORDER — AZATHIOPRINE 50 MG PO TABS
100.0000 mg | ORAL_TABLET | Freq: Two times a day (BID) | ORAL | Status: DC
  Administered 2020-04-07: 100 mg via ORAL
  Filled 2020-04-07 (×2): qty 2

## 2020-04-07 MED ORDER — ROSUVASTATIN CALCIUM 20 MG PO TABS
20.0000 mg | ORAL_TABLET | Freq: Every day | ORAL | Status: DC
  Administered 2020-04-07: 20 mg via ORAL
  Filled 2020-04-07: qty 1

## 2020-04-07 MED ORDER — FUROSEMIDE 10 MG/ML IJ SOLN
40.0000 mg | Freq: Two times a day (BID) | INTRAMUSCULAR | Status: DC
  Administered 2020-04-07: 40 mg via INTRAVENOUS
  Filled 2020-04-07: qty 4

## 2020-04-07 MED ORDER — SODIUM CHLORIDE 0.9 % IV SOLN: 250.0000 mL | INTRAVENOUS | Status: DC | PRN

## 2020-04-07 MED ORDER — ASPIRIN EC 81 MG PO TBEC
81.0000 mg | DELAYED_RELEASE_TABLET | Freq: Every day | ORAL | Status: DC
  Administered 2020-04-07: 81 mg via ORAL
  Filled 2020-04-07: qty 1

## 2020-04-07 NOTE — ED Notes (Signed)
While walking in hallway patient 02 was 94% pulse was 73. Patient stated he was a Little light headed, but felt ok.

## 2020-04-07 NOTE — Discharge Summary (Signed)
Cody Arellano, is a 74 y.o. male  DOB November 19, 1945  MRN [id number].  Admission date:  04/06/2020  Admitting Physician  Vianne Bulls, MD  Discharge Date:  04/07/2020   Primary MD  Venia Carbon, MD  Recommendations for primary care physician for things to follow:  -Please keep counseling about 1.5 fluid restriction and salt restriction diet. -Instructed to keep his follow-up appointment with [facility] cardiology regarding valve repair surgery.   Admission Diagnosis  Acute on chronic systolic CHF (congestive heart failure) (HCC) [I50.23]   Discharge Diagnosis  Acute on chronic systolic CHF (congestive heart failure) (HCC) [I50.23]   Principal Problem:   Acute on chronic systolic CHF (congestive heart failure) (HCC) Active Problems:   CAD (coronary artery disease)   Systemic lupus erythematosus (HCC)   Paroxysmal atrial fibrillation ([place])   Pulmonary embolism (HCC)      Past Medical History:  Diagnosis Date  . Anxiety   . CHF (congestive heart failure) ([place])   . Chronic tension headache   . Colon polyps   . Coronary artery disease 2008/2009   MI with PCI x 2, then PCI x 1 in 2009  . Depression   . Dyslipidemia   . Dysrhythmia    atrial fibrillation  . Hyperlipidemia   . Hypertension   . Lupus [facility])    sees Dr Trudie Reed  . Lupus disease of the lung   . 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 Myocardial infarction ([place]) 2008  . 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 Shortness of breath     Past Surgical History:  Procedure Laterality Date  . CARDIAC CATHETERIZATION    . CLIPPING OF ATRIAL APPENDAGE N/A 10/17/2019   Procedure: Clipping Of Atrial Appendage using AtriCure [id number] 45 MM AtriClip.;  Surgeon: Rexene Alberts, MD;  Location: [place];  Service: Open Heart Surgery;  Laterality: N/A;  . CORONARY ARTERY BYPASS GRAFT N/A 10/17/2019    Procedure: CORONARY ARTERY BYPASS GRAFTING (CABG) using LIMA to LAD; Endoscopic harvest right greater saphenous vein: SVG to RAMUS.;  Surgeon: Rexene Alberts, MD;  Location: [place];  Service: Open Heart Surgery;  Laterality: N/A;  . CORONARY BALLOON ANGIOPLASTY N/A 10/17/2019   Procedure: CORONARY BALLOON ANGIOPLASTY;  Surgeon: Nelva Bush, MD;  Location: [place] CV [facility];  Service: Cardiovascular;  Laterality: N/A;  . coronary stents  2009  . CORONARY/GRAFT ACUTE MI REVASCULARIZATION N/A 10/17/2019   Procedure: Coronary/Graft Acute MI Revascularization;  Surgeon: Nelva Bush, MD;  Location: [place] [facility];  Service: Cardiovascular;  Laterality: N/A;  . ENDOVEIN HARVEST OF GREATER SAPHENOUS VEIN Right 10/17/2019   Procedure: [place] Of Greater Saphenous Vein;  Surgeon: Rexene Alberts, MD;  Location: [place];  Service: Open Heart Surgery;  Laterality: Right;  . IABP INSERTION N/A 10/17/2019   Procedure: IABP Insertion;  Surgeon: Nelva Bush, MD;  Location: [place] [facility];  Service: Cardiovascular;  Laterality: N/A;  .  INCISION AND DRAINAGE ABSCESS N/A 01/29/2020   Procedure: INCISION AND DRAINAGE BILATERAL PERIRECTAL ABSCESS;  Surgeon: Stark Klein, MD;  Location: [place];  Service: General;  Laterality: N/A;  . IR THORACENTESIS ASP PLEURAL SPACE W/IMG GUIDE  10/26/2019  . RIGHT/LEFT HEART CATH AND CORONARY ANGIOGRAPHY N/A 10/17/2019   Procedure: RIGHT/LEFT HEART CATH AND CORONARY ANGIOGRAPHY;  Surgeon: Nelva Bush, MD;  Location: [place] [facility];  Service: Cardiovascular;  Laterality: N/A;  . TEE WITHOUT CARDIOVERSION  10/17/2019   Procedure: Transesophageal Echocardiogram ([place]);  Surgeon: Rexene Alberts, MD;  Location: [facility] OR;  Service: Open Heart Surgery;;       History of present illness and  Hospital Course:     Kindly see H&P for history of present illness and admission details, please review complete Labs, Consult reports and Test reports for all  details in brief  HPI  from the history and physical done on the day of admission 04/06/2020  HPI: [name] is a 74 y.o. male with medical history significant for lupus, interstitial lung disease, coronary artery disease, chronic systolic CHF, paroxysmal atrial fibrillation on Eliquis, and recent PE after missing a few doses of Eliquis, now presenting to the emergency department with shortness of breath.  Patient was started on IV heparin during the recent hospital patient, had echocardiogram with mildly reduced LV EF and moderate to severe tricuspid and mitral regurgitation, was also diuresed with IV Lasix at that time, and discharged in much improved condition back on Eliquis.  He was feeling better at time of discharge on 03/25/2020 but developed recurrent dyspnea and orthopnea within a couple days.  He followed up with his cardiologist at the [facility] who put him on Lasix 40 mg daily, but he continues to feel short of breath.  Patient reports that his [place] cardiologist wants to see him back later this month to discuss valve repair.  Patient denies any fevers or chills.  He has mild bilateral ankle edema.  No hemoptysis.  No melena or hematochezia.  ED Course: Upon arrival to the ED, patient is found to be afebrile, saturating mid 90s on room air, slightly tachypneic, and with stable blood pressure.  EKG features sinus rhythm with LAD.  Chest x-ray notable for stable bibasilar airspace opacities and resolution of previously noted effusions.  Chemistry panel and CBC are unremarkable.  Troponin is slightly elevated and BNP is elevated to 1678.  Covid screening test is pending.  Patient was given 40 mg IV Lasix, reports some improvement, but remains short of breath with slight exertion.   Hospital Course   1. Acute on chronic systolic CHF  - Presents with progressive SOB, orthopnea, weight-gain, and mild ankle edema  - Echo last month with EF 45-50%, moderate LAE, and modeerate-severe MR and TR  - He was  started on oral Lasix by his cardiologist at the [facility] a few days but has not noticed any change in urination or improvement in respiratory sxs , so he was kept on IV Lasix during hospital stay, he received total of 2 doses, with good urine output, he is with no hypoxia, he was ambulated in the hallway today, no hypoxia, no tachycardia, with acceptable blood pressure, he will be discharged home on his home dose Lasix of February milligrams oral daily, but educated at length about fluid restriction 1.5 L/day, and low-salt diet, as well instructed to take extra Lasix in the day he gains 3 pounds or develop lower extremity edema.   2. Pulmonary  embolism  - Diagnosed last month after he had run out of Eliquis for a few days - Denies any recent missed doses  - Continue Eliquis    3. Paroxysmal atrial fibrillation  -In sinus rhythm on admission -CHADS-VASc is 17 (age, CHF, CAD) -Continue Eliquis    4. Lupus  -Patient reports hx of SLE with pulmonary involvement -Continue Plaquenil and Imuran   5. CAD - Patient reports some ongoing pleuritic pain likely related to recent PE but no exertional chest pain  - HS troponin slightly elevated and decreasing  - Continue ASA and statin   6. Valvular heart disease  - Patient reports plan to follow-up with his cardiologist at [facility] later this month to discuss valve repair     Discharge Condition:  Stable -Discussed with son at bedside   Follow UP   Follow-up Information    Viviana Simpler I, MD Follow up in 1 week(s).   Specialties: Internal Medicine, Pediatrics Contact information: [address] [place] [address] [phone]                 Discharge Instructions  and  Discharge Medications    Discharge Instructions    Diet - low sodium heart healthy   Complete by: As directed    Increase activity slowly   Complete by: As directed      Allergies as of 04/07/2020   No Known Allergies     Medication List     TAKE these medications   apixaban 5 MG Tabs tablet Commonly known as: ELIQUIS Take 1 tablet (5 mg total) by mouth 2 (two) times daily. What changed: Another medication with the same name was removed. Continue taking this medication, and follow the directions you see here.   Artificial Tears 1.4 % ophthalmic solution Generic drug: polyvinyl alcohol Place 2 drops into both eyes daily.   aspirin EC 81 MG tablet Take 1 tablet (81 mg total) by mouth daily.   azaTHIOprine 50 MG tablet Commonly known as: IMURAN Take 100 mg by mouth 2 (two) times daily.   furosemide 40 MG tablet Commonly known as: LASIX Please take 40 mg oral daily, and take extra 40 mg of Lasix if you gain 3 pounds have worsening lower extremity edema for 1 day. What changed:   how much to take  how to take this  when to take this  additional instructions   hydroxychloroquine 200 MG tablet Commonly known as: PLAQUENIL Take 400 mg by mouth at bedtime.   oxyCODONE 5 MG immediate release tablet Commonly known as: Oxy IR/ROXICODONE Take 1 tablet (5 mg total) by mouth every 6 (six) hours as needed for severe pain.   polyethylene glycol 17 g packet Commonly known as: MIRALAX / GLYCOLAX Take 17 g by mouth daily as needed for moderate constipation.   rosuvastatin 20 MG tablet Commonly known as: CRESTOR Take 20 mg by mouth daily.   tamsulosin 0.4 MG Caps capsule Commonly known as: FLOMAX Take 0.4 mg by mouth every evening.         Diet and Activity recommendation: See Discharge Instructions above   Consults obtained -  none   Major procedures and Radiology Reports - PLEASE review detailed and final reports for all details, in brief -      DG Chest 2 View  Result Date: 04/06/2020 CLINICAL DATA:  Shortness of breath EXAM: CHEST - 2 VIEW COMPARISON:  03/23/2020 FINDINGS: Cardiac shadow is mildly enlarged. Postsurgical changes are again noted and  stable. Aortic calcifications are again seen.  Patchy airspace opacities again noted in the bases bilaterally similar to that noted on prior CT examination. Previously seen effusions have resolved in the interval. IMPRESSION: Stable bibasilar airspace opacity similar to that seen on prior CT. Previously seen effusions have resolved. Electronically Signed   By: Inez Catalina M.D.   On: 04/06/2020 21:40   DG Chest 2 View  Result Date: 03/09/2020 CLINICAL DATA:  Dyspnea on exertion EXAM: CHEST - 2 VIEW COMPARISON:  11/16/2019, CT 11/13/2019 FINDINGS: Chronic interstitial opacity. Borderline to mild cardiomegaly with central congestion. Development of small pleural effusions and hazy airspace disease at both bases. Aortic atherosclerosis. No pneumothorax. Atrial appendage clip. Scoliosis of the spine. Chronic deformity distal left clavicle. IMPRESSION: Interval borderline to mild cardiomegaly with vascular congestion and small pleural effusions. Hazy edema or infiltrates at the bases. Electronically Signed   By: Donavan Foil M.D.   On: 03/09/2020 22:07   CT Angio Chest PE W and/or Wo Contrast  Result Date: 03/23/2020 CLINICAL DATA:  Chest pain and shortness of breath. Nausea, lack of appetite, chills, and weakness for 1 week. EXAM: CT ANGIOGRAPHY CHEST WITH CONTRAST TECHNIQUE: Multidetector CT imaging of the chest was performed using the standard protocol during bolus administration of intravenous contrast. Multiplanar CT image reconstructions and MIPs were obtained to evaluate the vascular anatomy. CONTRAST:  34mL OMNIPAQUE IOHEXOL 350 MG/ML SOLN COMPARISON:  11/12/2019 FINDINGS: Cardiovascular: Good opacification of the central and segmental pulmonary arteries. Filling defects are identified in bilateral lower lobe pulmonary arteries consistent with pulmonary embolus. This is new since the previous study. Diffuse cardiac enlargement. Reflux of contrast material into the hepatic veins may indicate right heart failure. RV to LV ratio is about 0.88,  suggesting right heart strain is unlikely. No pericardial effusions. Postoperative changes in the mediastinum. Normal caliber of the thoracic aorta with scattered calcifications. Mediastinum/Nodes: Mediastinal lymph nodes are not pathologically enlarged. Esophagus is decompressed. Lungs/Pleura: Motion artifact limits examination. Moderate right and small left pleural effusions. Interstitial and alveolar perihilar infiltrates, likely edema. Bronchiectasis and emphysematous changes in the lungs. Upper Abdomen: Small amount of ascites around the liver. Musculoskeletal: Thoracic scoliosis convex towards the right. No destructive bone lesions. Review of the MIP images confirms the above findings. IMPRESSION: 1. Bilateral lower lobe pulmonary emboli. A normal RV to LV ratio argues against right heart strain. 2. Diffuse cardiac enlargement with reflux of contrast material into the hepatic veins suggesting right heart failure. 3. Moderate right and small left pleural effusions with interstitial and alveolar perihilar infiltrates, likely edema. 4. Bronchiectasis and emphysematous changes in the lungs. 5. Small amount of ascites around the liver. 6. Emphysema and aortic atherosclerosis. Aortic Atherosclerosis (ICD10-I70.0) and Emphysema (ICD10-J43.9). Critical Value/emergent results were called by telephone at the time of interpretation on 03/23/2020 at 3:15 am to provider [facility] , who verbally acknowledged these results. Electronically Signed   By: Lucienne Capers M.D.   On: 03/23/2020 03:19   DG Chest Portable 1 View  Result Date: 03/23/2020 CLINICAL DATA:  Nausea, not eating, chills, shortness of breath, and generalized weakness. EXAM: PORTABLE CHEST 1 VIEW COMPARISON:  03/09/2020 FINDINGS: Postoperative changes in the mediastinum. Diffuse cardiac enlargement. Pulmonary vascular congestion. Increasing interstitial and alveolar infiltrates in the lungs suggesting progressing edema or possibly developing  pneumonia. Small bilateral pleural effusions. No pneumothorax. Calcification of the aorta. Thoracic scoliosis convex towards the right. IMPRESSION: Cardiac enlargement with pulmonary vascular congestion and increasing bilateral interstitial and alveolar infiltrates suggesting progressing  edema or developing pneumonia. Electronically Signed   By: Lucienne Capers M.D.   On: 03/23/2020 02:50   ECHOCARDIOGRAM COMPLETE  Result Date: 03/23/2020    ECHOCARDIOGRAM REPORT   Patient Name:   JOHNE BUCKLE Ankney Date of Exam: 03/23/2020 Medical Rec #:  [id number]    Height:       72.0 in Accession #:    [id number]   Weight:       155.0 lb Date of Birth:  03-14-1946    BSA:          1.912 m Patient Age:    8 years     BP:           118/81 mmHg Patient Gender: M            HR:           72 bpm. Exam Location:  Inpatient Procedure: 2D Echo and 3D Echo Indications:    Pulmonary Embolus 415.19 / I26.99  History:        Patient has prior history of Echocardiogram examinations, most                 recent 10/19/2019. CAD and Previous Myocardial Infarction, Prior                 CABG, Arrythmias:Atrial Fibrillation; Risk Factors:Hypertension                 and Dyslipidemia. Lupus.  Sonographer:    Darlina Sicilian RDCS Referring Phys: [id number] [place]  1. Left ventricular ejection fraction, by estimation, is 45 to 50%. The left ventricle has mildly decreased function. The left ventricle demonstrates regional wall motion abnormalities (see scoring diagram/findings for description). Left ventricular diastolic parameters are indeterminate.  2. Right ventricular systolic function is moderately reduced. The right ventricular size is mildly enlarged. There is mildly elevated pulmonary artery systolic pressure.  3. Left atrial size was moderately dilated.  4. Right atrial size was severely dilated.  5. The mitral valve is normal in structure. Moderate to severe mitral valve regurgitation.  6. Tricuspid valve regurgitation is  moderate to severe.  7. The aortic valve is normal in structure. There is mild calcification of the aortic valve. There is mild thickening of the aortic valve. Aortic valve regurgitation is mild. Mild aortic valve sclerosis is present, with no evidence of aortic valve stenosis.  8. Pulmonic valve regurgitation is moderate.  9. The inferior vena cava is normal in size with <50% respiratory variability, suggesting right atrial pressure of 8 mmHg. Comparison(s): Changes from prior study are noted. Conclusion(s)/Recommendation(s): Since last echo, EF appears to be more reduced. Anteroseptal wall appears akinetic, and bordering walls are hypokinetic. This is a similar pattern to prior echo, but is now more pronounced. FINDINGS  Left Ventricle: Left ventricular ejection fraction, by estimation, is 45 to 50%. The left ventricle has mildly decreased function. The left ventricle demonstrates regional wall motion abnormalities. The left ventricular internal cavity size was normal in size. There is no left ventricular hypertrophy. Left ventricular diastolic parameters are indeterminate.  LV Wall Scoring: The entire anterior septum and apex are akinetic. The mid and distal anterior wall, mid and distal inferior wall, apical lateral segment, and mid inferoseptal segment are hypokinetic. The antero-lateral wall, posterior wall, basal anterior segment, basal inferior segment, and basal inferoseptal segment are normal. Right Ventricle: The right ventricular size is mildly enlarged. Right vetricular wall thickness was not well visualized. Right ventricular systolic function is moderately reduced.  There is mildly elevated pulmonary artery systolic pressure. The tricuspid  regurgitant velocity is 2.88 m/s, and with an assumed right atrial pressure of 8 mmHg, the estimated right ventricular systolic pressure is 11.9 mmHg. Left Atrium: Left atrial size was moderately dilated. Right Atrium: Right atrial size was severely dilated.  Pericardium: There is no evidence of pericardial effusion. Mitral Valve: Eccentric MR jet directed posteriorly. At least moderate, and based on image 51 also may have Coanda effect, suggesting MR may be severe. The mitral valve is normal in structure. Moderate to severe mitral valve regurgitation. Tricuspid Valve: The tricuspid valve is normal in structure. Tricuspid valve regurgitation is moderate to severe. Aortic Valve: The aortic valve is normal in structure. There is mild calcification of the aortic valve. There is mild thickening of the aortic valve. Aortic valve regurgitation is mild. Aortic regurgitation PHT measures 701 msec. Mild aortic valve sclerosis is present, with no evidence of aortic valve stenosis. Aortic valve mean gradient measures 4.0 mmHg. Aortic valve peak gradient measures 7.7 mmHg. Aortic valve area, by VTI measures 1.81 cm. Pulmonic Valve: The pulmonic valve was grossly normal. Pulmonic valve regurgitation is moderate. No evidence of pulmonic stenosis. Aorta: The aortic root, ascending aorta and aortic arch are all structurally normal, with no evidence of dilitation or obstruction. Venous: The inferior vena cava is normal in size with less than 50% respiratory variability, suggesting right atrial pressure of 8 mmHg. IAS/Shunts: The atrial septum is grossly normal.  LEFT VENTRICLE PLAX 2D LVIDd:         5.30 cm  Diastology LVIDs:         4.00 cm  LV e' medial:    8.16 cm/s LV PW:         0.80 cm  LV E/e' medial:  11.7 LV IVS:        0.80 cm  LV e' lateral:   7.51 cm/s LVOT diam:     2.00 cm  LV E/e' lateral: 12.7 LV SV:         41 LV SV Index:   21 LVOT Area:     3.14 cm  RIGHT VENTRICLE TAPSE (M-mode): 1.4 cm LEFT ATRIUM             Index       RIGHT ATRIUM           Index LA diam:        3.80 cm 1.99 cm/m  RA Area:     27.80 cm LA Vol (A2C):   90.4 ml 47.28 ml/m RA Volume:   94.90 ml  49.64 ml/m LA Vol (A4C):   56.3 ml 29.45 ml/m LA Biplane Vol: 76.1 ml 39.80 ml/m  AORTIC VALVE AV  Area (Vmax):    1.85 cm AV Area (Vmean):   1.72 cm AV Area (VTI):     1.81 cm AV Vmax:           139.00 cm/s AV Vmean:          93.100 cm/s AV VTI:            0.224 m AV Peak Grad:      7.7 mmHg AV Mean Grad:      4.0 mmHg LVOT Vmax:         82.00 cm/s LVOT Vmean:        51.100 cm/s LVOT VTI:          0.129 m LVOT/AV VTI ratio: 0.58 AI PHT:  701 msec  AORTA Ao Root diam: 3.40 cm MITRAL VALVE                 TRICUSPID VALVE MV Area (PHT): 5.54 cm      TR Peak grad:   33.2 mmHg MV Decel Time: 137 msec      TR Vmax:        288.00 cm/s MR Peak grad:    77.8 mmHg MR Mean grad:    53.0 mmHg   SHUNTS MR Vmax:         441.00 cm/s Systemic VTI:  0.13 m MR Vmean:        342.0 cm/s  Systemic Diam: 2.00 cm MR PISA:         2.26 cm MR PISA Eff ROA: 20 mm MR PISA Radius:  0.60 cm MV E velocity: 95.50 cm/s Buford Dresser MD Electronically signed by Buford Dresser MD Signature Date/Time: 03/23/2020/9:31:14 PM    Final    VAS [place] LOWER EXTREMITY VENOUS (DVT)  Result Date: 03/24/2020  Lower Venous DVTStudy Indications: Pulmonary embolism.  Comparison Study: No prior study Performing Technologist: Maudry Mayhew MHA, RDMS, RVT, RDCS  Examination Guidelines: A complete evaluation includes B-mode imaging, spectral Doppler, color Doppler, and power Doppler as needed of all accessible portions of each vessel. Bilateral testing is considered an integral part of a complete examination. Limited examinations for reoccurring indications may be performed as noted. The reflux portion of the exam is performed with the patient in reverse Trendelenburg.  +---------+---------------+---------+-----------+----------+--------------+ RIGHT    CompressibilityPhasicitySpontaneityPropertiesThrombus Aging +---------+---------------+---------+-----------+----------+--------------+ CFV      Full           Yes      Yes                                  +---------+---------------+---------+-----------+----------+--------------+ SFJ      Full                                                        +---------+---------------+---------+-----------+----------+--------------+ FV Prox  Full                                                        +---------+---------------+---------+-----------+----------+--------------+ FV Mid   Full                                                        +---------+---------------+---------+-----------+----------+--------------+ FV DistalFull                                                        +---------+---------------+---------+-----------+----------+--------------+ PFV      Full                                                        +---------+---------------+---------+-----------+----------+--------------+  POP      Full           Yes      Yes                                 +---------+---------------+---------+-----------+----------+--------------+ PTV      Full                                                        +---------+---------------+---------+-----------+----------+--------------+ PERO     Full                                                        +---------+---------------+---------+-----------+----------+--------------+   +---------+---------------+---------+-----------+----------+--------------+ LEFT     CompressibilityPhasicitySpontaneityPropertiesThrombus Aging +---------+---------------+---------+-----------+----------+--------------+ CFV      Full           Yes      Yes                                 +---------+---------------+---------+-----------+----------+--------------+ SFJ      Full                                                        +---------+---------------+---------+-----------+----------+--------------+ FV Prox  Full                                                         +---------+---------------+---------+-----------+----------+--------------+ FV Mid   Full                                                        +---------+---------------+---------+-----------+----------+--------------+ FV DistalFull                                                        +---------+---------------+---------+-----------+----------+--------------+ PFV      Full                                                        +---------+---------------+---------+-----------+----------+--------------+ POP      Full           Yes      Yes                                 +---------+---------------+---------+-----------+----------+--------------+  PTV      Full                                                        +---------+---------------+---------+-----------+----------+--------------+ PERO     Full                                                        +---------+---------------+---------+-----------+----------+--------------+     Summary: RIGHT: - There is no evidence of deep vein thrombosis in the lower extremity.  - No cystic structure found in the popliteal fossa.  LEFT: - There is no evidence of deep vein thrombosis in the lower extremity.  - No cystic structure found in the popliteal fossa.  *See table(s) above for measurements and observations. Electronically signed by Servando Snare MD on 03/24/2020 at 4:08:41 PM.    Final     Micro Results     Recent Results (from the past 240 hour(s))  Respiratory Panel by RT PCR (Flu A&B, Covid) - Nasopharyngeal Swab     Status: None   Collection Time: 04/07/20  1:19 AM   Specimen: Nasopharyngeal Swab  Result Value Ref Range Status   SARS Coronavirus 2 by RT PCR NEGATIVE NEGATIVE Final    Comment: (NOTE) SARS-CoV-2 target nucleic acids are NOT DETECTED.  The SARS-CoV-2 RNA is generally detectable in upper respiratoy specimens during the acute phase of infection. The lowest concentration of SARS-CoV-2 viral  copies this assay can detect is 131 copies/mL. A negative result does not preclude SARS-Cov-2 infection and should not be used as the sole basis for treatment or other patient management decisions. A negative result may occur with  improper specimen collection/handling, submission of specimen other than nasopharyngeal swab, presence of viral mutation(s) within the areas targeted by this assay, and inadequate number of viral copies (<131 copies/mL). A negative result must be combined with clinical observations, patient history, and epidemiological information. The expected result is Negative.  Fact Sheet for Patients:  PinkCheek.be  Fact Sheet for Healthcare Providers:  GravelBags.it  This test is no t yet approved or cleared by the [place] FDA and  has been authorized for detection and/or diagnosis of SARS-CoV-2 by FDA under an Emergency Use Authorization (EUA). This EUA will remain  in effect (meaning this test can be used) for the duration of the COVID-19 declaration under Section 564(b)(1) of the Act, 21 U.S.C. section 360bbb-3(b)(1), unless the authorization is terminated or revoked sooner.     Influenza A by PCR NEGATIVE NEGATIVE Final   Influenza B by PCR NEGATIVE NEGATIVE Final    Comment: (NOTE) The Xpert Xpress SARS-CoV-2/FLU/RSV assay is intended as an aid in  the diagnosis of influenza from Nasopharyngeal swab specimens and  should not be used as a sole basis for treatment. Nasal washings and  aspirates are unacceptable for Xpert Xpress SARS-CoV-2/FLU/RSV  testing.  Fact Sheet for Patients: PinkCheek.be  Fact Sheet for Healthcare Providers: GravelBags.it  This test is not yet approved or cleared by the [place] FDA and  has been authorized for detection and/or diagnosis of SARS-CoV-2 by  FDA under an Emergency Use Authorization (EUA). This  EUA will remain  in effect (meaning this test can be used) for the duration of the  Covid-19 declaration under Section 564(b)(1) of the Act, 21  U.S.C. section 360bbb-3(b)(1), unless the authorization is  terminated or revoked. Performed at [facility], [place] [address], [place], [place] [address]        Today   Subjective:   Alika Saladin today has no headache,no chest abdominal pain,no new weakness tingling or numbness, ambulated  in the hallway, no hypoxia, no tachycardia.  Objective:   Blood pressure 101/77, pulse 65, temperature (!) 97.4 F (36.3 C), temperature source Oral, resp. rate 16, height 6' (1.829 m), weight 75 kg, SpO2 99 %.   Intake/Output Summary (Last 24 hours) at 04/07/2020 1422 Last data filed at 04/07/2020 1231 Gross per 24 hour  Intake --  Output 1980 ml  Net -1980 ml    Exam Awake Alert, Oriented x 3, No new F.N deficits, Normal affect Symmetrical Chest wall movement, Good air movement bilaterally, CTAB RRR,No Gallops,Rubs or new Murmurs, No Parasternal Heave +ve B.Sounds, Abd Soft, Non tender, No Cyanosis, Clubbing or edema, No new Rash or bruise  Data Review   CBC w Diff:  Lab Results  Component Value Date   WBC 4.8 04/06/2020   HGB 12.5 (L) 04/06/2020   HCT 41.2 04/06/2020   PLT 219 04/06/2020   LYMPHOPCT 22 02/02/2020   MONOPCT 9 02/02/2020   EOSPCT 4 02/02/2020   BASOPCT 1 02/02/2020    CMP:  Lab Results  Component Value Date   NA 139 04/06/2020   NA 142 06/12/2018   K 4.1 04/06/2020   CL 104 04/06/2020   CO2 25 04/06/2020   BUN 17 04/06/2020   BUN 8 06/12/2018   CREATININE 1.11 04/06/2020   PROT 6.4 (L) 03/22/2020   PROT 6.4 06/12/2018   ALBUMIN 3.1 (L) 03/22/2020   ALBUMIN 3.4 (L) 06/12/2018   BILITOT 0.7 03/22/2020   BILITOT 0.3 06/12/2018   ALKPHOS 48 03/22/2020   AST 21 03/22/2020   ALT 14 03/22/2020  .   Total Time in preparing paper work, data evaluation and todays exam - 105 minutes  Phillips Climes  M.D on 04/07/2020 at 2:22 PM  [facility]   Office  [phone]

## 2020-04-07 NOTE — Discharge Instructions (Signed)
Follow with Primary MD Venia Carbon, MD in 7 days   Get CBC, CMP, 2 view Chest X ray checked  by Primary MD next visit.    Activity: As tolerated with Full fall precautions use walker/cane & assistance as needed   Disposition Home    Diet: Heart Healthy  , with feeding assistance and aspiration precautions.  For Heart failure patients - Check your Weight same time everyday, if you gain over 2 pounds, or you develop in leg swelling, experience more shortness of breath please take an extra dose of lasix and  call your Primary MD immediately. Follow Cardiac Low Salt Diet and 1.5 lit/day fluid restriction.   On your next visit with your primary care physician please Get Medicines reviewed and adjusted.   Please request your Prim.MD to go over all Hospital Tests and Procedure/Radiological results at the follow up, please get all Hospital records sent to your Prim MD by signing hospital release before you go home.   If you experience worsening of your admission symptoms, develop shortness of breath, life threatening emergency, suicidal or homicidal thoughts you must seek medical attention immediately by calling 911 or calling your MD immediately  if symptoms less severe.  You Must read complete instructions/literature along with all the possible adverse reactions/side effects for all the Medicines you take and that have been prescribed to you. Take any new Medicines after you have completely understood and accpet all the possible adverse reactions/side effects.   Do not drive, operating heavy machinery, perform activities at heights, swimming or participation in water activities or provide baby sitting services if your were admitted for syncope or siezures until you have seen by Primary MD or a Neurologist and advised to do so again.  Do not drive when taking Pain medications.    Do not take more than prescribed Pain, Sleep and Anxiety Medications  Special Instructions: If you have  smoked or chewed Tobacco  in the last 2 yrs please stop smoking, stop any regular Alcohol  and or any Recreational drug use.  Wear Seat belts while driving.   Please note  You were cared for by a hospitalist during your hospital stay. If you have any questions about your discharge medications or the care you received while you were in the hospital after you are discharged, you can call the unit and asked to speak with the hospitalist on call if the hospitalist that took care of you is not available. Once you are discharged, your primary care physician will handle any further medical issues. Please note that NO REFILLS for any discharge medications will be authorized once you are discharged, as it is imperative that you return to your primary care physician (or establish a relationship with a primary care physician if you do not have one) for your aftercare needs so that they can reassess your need for medications and monitor your lab values.

## 2020-04-07 NOTE — ED Notes (Signed)
Lunch Tray Ordered @ 1016. 

## 2020-04-07 NOTE — H&P (Signed)
History and Physical    Cody Arellano MGQ:[id number] DOB: [birth year] DOA: 04/06/2020  PCP: Venia Carbon, MD   Patient coming from: Home   Chief Complaint: SOB   HPI: Cody Arellano is a 74 y.o. male with medical history significant for lupus, interstitial lung disease, coronary artery disease, chronic systolic CHF, paroxysmal atrial fibrillation on Eliquis, and recent PE after missing a few doses of Eliquis, now presenting to the emergency department with shortness of breath.  Patient was started on IV heparin during the recent hospital patient, had echocardiogram with mildly reduced LV EF and moderate to severe tricuspid and mitral regurgitation, was also diuresed with IV Lasix at that time, and discharged in much improved condition back on Eliquis.  He was feeling better at time of discharge on 03/25/2020 but developed recurrent dyspnea and orthopnea within a couple days.  He followed up with his cardiologist at the [facility] who put him on Lasix 40 mg daily, but he continues to feel short of breath.  Patient reports that his [place] [facility] cardiologist wants to see him back later this month to discuss valve repair.  Patient denies any fevers or chills.  He has mild bilateral ankle edema.  No hemoptysis.  No melena or hematochezia.  ED Course: Upon arrival to the ED, patient is found to be afebrile, saturating mid 90s on room air, slightly tachypneic, and with stable blood pressure.  EKG features sinus rhythm with LAD.  Chest x-ray notable for stable bibasilar airspace opacities and resolution of previously noted effusions.  Chemistry panel and CBC are unremarkable.  Troponin is slightly elevated and BNP is elevated to 1678.  Covid screening test is pending.  Patient was given 40 mg IV Lasix, reports some improvement, but remains short of breath with slight exertion.  Review of Systems:  All other systems reviewed and apart from HPI, are negative.  Past Medical History:  Diagnosis Date  . Anxiety   . CHF  (congestive heart failure) ([place])   . Chronic tension headache   . Colon polyps   . Coronary artery disease 2008/2009   MI with PCI x 2, then PCI x 1 in 2009  . Depression   . Dyslipidemia   . Dysrhythmia    atrial fibrillation  . Hyperlipidemia   . Hypertension   . Lupus [facility])    sees Dr Trudie Reed  . Lupus disease of the lung   . 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 Myocardial infarction ([place]) 2008  . 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 Shortness of breath     Past Surgical History:  Procedure Laterality Date  . CARDIAC CATHETERIZATION    . CLIPPING OF ATRIAL APPENDAGE N/A 10/17/2019   Procedure: Clipping Of Atrial Appendage using AtriCure [id number] 45 MM AtriClip.;  Surgeon: Rexene Alberts, MD;  Location: [place];  Service: Open Heart Surgery;  Laterality: N/A;  . CORONARY ARTERY BYPASS GRAFT N/A 10/17/2019   Procedure: CORONARY ARTERY BYPASS GRAFTING (CABG) using LIMA to LAD; Endoscopic harvest right greater saphenous vein: SVG to RAMUS.;  Surgeon: Rexene Alberts, MD;  Location: [place];  Service: Open Heart Surgery;  Laterality: N/A;  . CORONARY BALLOON ANGIOPLASTY N/A 10/17/2019   Procedure: CORONARY BALLOON ANGIOPLASTY;  Surgeon: Nelva Bush, MD;  Location: [place] [facility];  Service: Cardiovascular;  Laterality: N/A;  . coronary stents  2009  . CORONARY/GRAFT ACUTE MI REVASCULARIZATION N/A 10/17/2019  Procedure: Coronary/Graft Acute MI Revascularization;  Surgeon: Nelva Bush, MD;  Location: [place] [facility];  Service: Cardiovascular;  Laterality: N/A;  . ENDOVEIN HARVEST OF GREATER SAPHENOUS VEIN Right 10/17/2019   Procedure: [place] Of Greater Saphenous Vein;  Surgeon: Rexene Alberts, MD;  Location: [place];  Service: Open Heart Surgery;  Laterality: Right;  . IABP INSERTION N/A 10/17/2019   Procedure: IABP Insertion;  Surgeon: Nelva Bush, MD;  Location: [place] [facility];  Service: Cardiovascular;   Laterality: N/A;  . INCISION AND DRAINAGE ABSCESS N/A 01/29/2020   Procedure: INCISION AND DRAINAGE BILATERAL PERIRECTAL ABSCESS;  Surgeon: Stark Klein, MD;  Location: [place];  Service: General;  Laterality: N/A;  . IR THORACENTESIS ASP PLEURAL SPACE W/IMG GUIDE  10/26/2019  . RIGHT/LEFT HEART CATH AND CORONARY ANGIOGRAPHY N/A 10/17/2019   Procedure: RIGHT/LEFT HEART CATH AND CORONARY ANGIOGRAPHY;  Surgeon: Nelva Bush, MD;  Location: [place] [facility];  Service: Cardiovascular;  Laterality: N/A;  . TEE WITHOUT CARDIOVERSION  10/17/2019   Procedure: Transesophageal Echocardiogram (Tee);  Surgeon: Rexene Alberts, MD;  Location: [facility] OR;  Service: Open Heart Surgery;;    Social History:   reports that he quit smoking about 27 years ago. His smoking use included cigarettes. He has a 20.00 pack-year smoking history. He has never used smokeless tobacco. He reports that he does not drink alcohol and does not use drugs.  No Known Allergies  Family History  Problem Relation Age of Onset  . Heart disease Mother   . Alcohol abuse Father   . Hyperlipidemia Sister   . Hypertension Sister   . Hyperlipidemia Brother   . Hypertension Brother   . Cancer Brother        ?lung cancer  . Stomach cancer Brother   . Diabetes Paternal Uncle   . Colon cancer Neg Hx   . Esophageal cancer Neg Hx   . Rectal cancer Neg Hx      Prior to Admission medications   Medication Sig Start Date End Date Taking? Authorizing Provider  apixaban (ELIQUIS) 5 MG TABS tablet Take 1 tablet (5 mg total) by mouth 2 (two) times daily. 04/01/20 05/01/20 Yes Pahwani, Einar Grad, MD  aspirin EC 81 MG tablet Take 1 tablet (81 mg total) by mouth daily. 08/10/14  Yes Nahser, Wonda Cheng, MD  azaTHIOprine (IMURAN) 50 MG tablet Take 100 mg by mouth 2 (two) times daily.  10/11/14  Yes [provider]  furosemide (LASIX) 40 MG tablet Take 40 mg by mouth daily.   Yes [provider]  hydroxychloroquine (PLAQUENIL) 200 MG tablet  Take 400 mg by mouth at bedtime.    Yes [provider]  oxyCODONE (OXY IR/ROXICODONE) 5 MG immediate release tablet Take 1 tablet (5 mg total) by mouth every 6 (six) hours as needed for severe pain. 02/02/20  Yes Rai, Ripudeep K, MD  polyethylene glycol (MIRALAX / GLYCOLAX) 17 g packet Take 17 g by mouth daily as needed for moderate constipation.    Yes [provider]  polyvinyl alcohol (ARTIFICIAL TEARS) 1.4 % ophthalmic solution Place 2 drops into both eyes daily.    Yes [provider]  rosuvastatin (CRESTOR) 20 MG tablet Take 20 mg by mouth daily.   Yes [provider]  tamsulosin (FLOMAX) 0.4 MG CAPS capsule Take 0.4 mg by mouth every evening.  04/02/17  Yes [provider]  apixaban (ELIQUIS) 5 MG TABS tablet Take 2 tablets (10 mg total) by mouth 2 (two) times  daily for 7 days. Patient not taking: Reported on 04/06/2020 03/25/20 04/01/20  Darliss Cheney, MD    Physical Exam: Vitals:   04/06/20 2215 04/06/20 2300 04/07/20 0000 04/07/20 0100  BP: 123/82 119/79 122/75 116/75  Pulse: 74 71 73 72  Resp: 18 (!) 28 (!) 25 18  Temp:      TempSrc:      SpO2: 98% 96% 100% 99%  Weight:      Height:        Constitutional: NAD, calm  Eyes: PERTLA, lids and conjunctivae normal ENMT: Mucous membranes are moist. Posterior pharynx clear of any exudate or lesions.   Neck: normal, supple, no masses, no thyromegaly Respiratory: Dyspnea with speech, no wheezing. No pallor or cyanosis.  Cardiovascular: S1 & S2 heard, regular rate and rhythm. Ankle edema bilaterally.  Abdomen: No distension, no tenderness, soft. Bowel sounds active.  Musculoskeletal: no clubbing / cyanosis. No joint deformity upper and lower extremities.   Skin: no significant rashes, lesions, ulcers. Warm, dry, well-perfused. Neurologic: No gross facial asymmetry. Sensation intact. Moving all extremities.  Psychiatric: Alert and oriented to person, place, and situation. Pleasant and  cooperative.    Labs and Imaging on Admission: I have personally reviewed following labs and imaging studies  CBC: Recent Labs  Lab 04/06/20 2134  WBC 4.8  HGB 12.5*  HCT 41.2  MCV 90.7  PLT 025   Basic Metabolic Panel: Recent Labs  Lab 04/06/20 2134  NA 139  K 4.1  CL 104  CO2 25  GLUCOSE 117*  BUN 17  CREATININE 1.11  CALCIUM 9.0   GFR: Estimated Creatinine Clearance: 61.9 mL/min (by C-G formula based on SCr of 1.11 mg/dL). Liver Function Tests: No results for input(s): AST, ALT, ALKPHOS, BILITOT, PROT, ALBUMIN in the last 168 hours. No results for input(s): LIPASE, AMYLASE in the last 168 hours. No results for input(s): AMMONIA in the last 168 hours. Coagulation Profile: Recent Labs  Lab 04/06/20 2134  INR 1.7*   Cardiac Enzymes: No results for input(s): CKTOTAL, CKMB, CKMBINDEX, TROPONINI in the last 168 hours. BNP (last 3 results) No results for input(s): PROBNP in the last 8760 hours. HbA1C: No results for input(s): HGBA1C in the last 72 hours. CBG: No results for input(s): GLUCAP in the last 168 hours. Lipid Profile: No results for input(s): CHOL, HDL, LDLCALC, TRIG, CHOLHDL, LDLDIRECT in the last 72 hours. Thyroid Function Tests: No results for input(s): TSH, T4TOTAL, FREET4, T3FREE, THYROIDAB in the last 72 hours. Anemia Panel: No results for input(s): VITAMINB12, FOLATE, FERRITIN, TIBC, IRON, RETICCTPCT in the last 72 hours. Urine analysis:    Component Value Date/Time   COLORURINE AMBER (A) 03/22/2020 1945   APPEARANCEUR HAZY (A) 03/22/2020 1945   LABSPEC 1.028 03/22/2020 1945   PHURINE 5.0 03/22/2020 1945   GLUCOSEU NEGATIVE 03/22/2020 1945   HGBUR NEGATIVE 03/22/2020 1945   BILIRUBINUR NEGATIVE 03/22/2020 1945   KETONESUR 5 (A) 03/22/2020 1945   PROTEINUR 100 (A) 03/22/2020 1945   UROBILINOGEN 2.0 (H) 02/04/2013 0200   NITRITE NEGATIVE 03/22/2020 1945   LEUKOCYTESUR NEGATIVE 03/22/2020 1945   Sepsis  Labs: @LABRCNTIP (procalcitonin:4,lacticidven:4) )No results found for this or any previous visit (from the past 240 hour(s)).   Radiological Exams on Admission: DG Chest 2 View  Result Date: 04/06/2020 CLINICAL DATA:  Shortness of breath EXAM: CHEST - 2 VIEW COMPARISON:  03/23/2020 FINDINGS: Cardiac shadow is mildly enlarged. Postsurgical changes are again noted and stable. Aortic calcifications are again seen. Patchy airspace opacities again noted  in the bases bilaterally similar to that noted on prior CT examination. Previously seen effusions have resolved in the interval. IMPRESSION: Stable bibasilar airspace opacity similar to that seen on prior CT. Previously seen effusions have resolved. Electronically Signed   By: Inez Catalina M.D.   On: 04/06/2020 21:40    EKG: Independently reviewed. Sinus rhythm, LAD.   Assessment/Plan   1. Acute on chronic systolic CHF  - Presents with progressive SOB, orthopnea, weight-gain, and mild ankle edema  - Echo last month with EF 45-50%, moderate LAE, and modeerate-severe MR and TR  - He was started on oral Lasix by his cardiologist at the [facility] a few days but has not noticed any change in urination or improvement in respiratory sxs  - He was given 40 mg IV Lasix in ED and has begun to diurese  - Continue diuresis with Lasix 40 mg IV, monitor wt and I/Os   2. Pulmonary embolism  - Diagnosed last month after he had run out of Eliquis for a few days - Denies any recent missed doses  - Continue Eliquis    3. Paroxysmal atrial fibrillation  - In sinus rhythm on admission  - CHADS-VASc is 41 (age, CHF, CAD)  - Continue Eliquis    4. 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 CAD - Patient reports some ongoing pleuritic pain likely related to recent PE but no exertional chest pain  - HS troponin slightly elevated and decreasing  - Continue ASA and statin   6. Valvular heart disease  - Patient  reports plan to follow-up with his cardiologist at [facility] later this month to discuss valve repair    DVT prophylaxis: Eliquis  Code Status: Full  Family Communication: Patient's son updated at bedside   Disposition Plan:  Patient is from: Home  Anticipated d/c is to: home  Anticipated d/c date is: 04/08/20 Patient currently: Dyspneic with minimal exertion  Consults called: None  Admission status: Observation     Vianne Bulls, MD [facility]  04/07/2020, 1:19 AM

## 2020-04-07 NOTE — ED Notes (Signed)
Patient verbalizes understanding of discharge instructions. Opportunity for questioning and answers were provided. Armband removed by staff, pt discharged from ED via wheelchair.  

## 2020-04-08 ENCOUNTER — Ambulatory Visit (INDEPENDENT_AMBULATORY_CARE_PROVIDER_SITE_OTHER): Payer: No Typology Code available for payment source | Admitting: Internal Medicine

## 2020-04-08 ENCOUNTER — Encounter: Payer: Self-pay | Admitting: Internal Medicine

## 2020-04-08 ENCOUNTER — Other Ambulatory Visit: Payer: Self-pay

## 2020-04-08 DIAGNOSIS — I5022 Chronic systolic (congestive) heart failure: Secondary | ICD-10-CM

## 2020-04-08 DIAGNOSIS — I38 Endocarditis, valve unspecified: Secondary | ICD-10-CM

## 2020-04-08 DIAGNOSIS — E441 Mild protein-calorie malnutrition: Secondary | ICD-10-CM | POA: Diagnosis not present

## 2020-04-08 NOTE — Assessment & Plan Note (Signed)
Discussed boost/ensure to try to gain back some weight

## 2020-04-08 NOTE — Patient Instructions (Signed)
Please continue the furosemide 40mg  daily. If your weight goes up by more than 3-5# in one day--take a second furosemide around lunchtime. If your weight goes down over 5# from your normal "dry" weight---you can hold the furosemide (and not take it that day).

## 2020-04-08 NOTE — Progress Notes (Signed)
Subjective:    Patient ID: Cody Arellano, male    DOB: 11/29/1945, 74 y.o.   MRN: [id number]  HPI Here for ER/hospital follow up With son [name] This visit occurred during the SARS-CoV-2 public health emergency.  Safety protocols were in place, including screening questions prior to the visit, additional usage of staff PPE, and extensive cleaning of exam room while observing appropriate contact time as indicated for disinfecting solutions.   Reviewed hospital stay and then overnight in ER 2 days ago Each time he has gone due to troubled breathing Small pulm embolus the first time--but ongoing eliquis Rx CHF diagnosed and improved with furosemide diuresis  Has seen cardiologist at [facility] Overall concern about severe mitral regurgitation and tricuspid regurgitation May need surgical repair Has follow up next Friday there  Used to use salt Has now cut it out Weighing daily at home  No chest pain No dizziness or syncope Has mild edema--fairly stable  Current Outpatient Medications on File Prior to Visit  Medication Sig Dispense Refill  . apixaban (ELIQUIS) 5 MG TABS tablet Take 1 tablet (5 mg total) by mouth 2 (two) times daily. 60 tablet 0  . aspirin EC 81 MG tablet Take 1 tablet (81 mg total) by mouth daily.    [name] azaTHIOprine (IMURAN) 50 MG tablet Take 100 mg by mouth 2 (two) times daily.   2  . furosemide (LASIX) 40 MG tablet Please take 40 mg oral daily, and take extra 40 mg of Lasix if you gain 3 pounds have worsening lower extremity edema for 1 day. 30 tablet   . hydroxychloroquine (PLAQUENIL) 200 MG tablet Take 400 mg by mouth at bedtime.     [name] oxyCODONE (OXY IR/ROXICODONE) 5 MG immediate release tablet Take 1 tablet (5 mg total) by mouth every 6 (six) hours as needed for severe pain. 30 tablet 0  . polyethylene glycol (MIRALAX / GLYCOLAX) 17 g packet Take 17 g by mouth daily as needed for moderate constipation.     . polyvinyl alcohol (ARTIFICIAL TEARS) 1.4 % ophthalmic solution  Place 2 drops into both eyes daily.     . rosuvastatin (CRESTOR) 20 MG tablet Take 20 mg by mouth daily.    . tamsulosin (FLOMAX) 0.4 MG CAPS capsule Take 0.4 mg by mouth every evening.      No current facility-administered medications on file prior to visit.    No Known Allergies  Past Medical History:  Diagnosis Date  . Anxiety   . CHF (congestive heart failure) ([place])   . Chronic tension headache   . Colon polyps   . Coronary artery disease 2008/2009   MI with PCI x 2, then PCI x 1 in 2009  . Depression   . Dyslipidemia   . Dysrhythmia    atrial fibrillation  . Hyperlipidemia   . Hypertension   . Lupus [facility])    sees Dr Trudie Reed  . Lupus disease of the lung   . 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 Myocardial infarction ([place]) 2008  . 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 Shortness of breath     Past Surgical History:  Procedure Laterality Date  . CARDIAC CATHETERIZATION    . CLIPPING OF ATRIAL APPENDAGE N/A 10/17/2019   Procedure: Clipping Of Atrial Appendage using AtriCure [id number] 45 MM AtriClip.;  Surgeon: Rexene Alberts, MD;  Location: [place];  Service: Open Heart Surgery;  Laterality:  N/A;  . CORONARY ARTERY BYPASS GRAFT N/A 10/17/2019   Procedure: CORONARY ARTERY BYPASS GRAFTING (CABG) using LIMA to LAD; Endoscopic harvest right greater saphenous vein: SVG to RAMUS.;  Surgeon: Rexene Alberts, MD;  Location: [place];  Service: Open Heart Surgery;  Laterality: N/A;  . CORONARY BALLOON ANGIOPLASTY N/A 10/17/2019   Procedure: CORONARY BALLOON ANGIOPLASTY;  Surgeon: Nelva Bush, MD;  Location: [place] CV LAB;  Service: Cardiovascular;  Laterality: N/A;  . coronary stents  2009  . CORONARY/GRAFT ACUTE MI REVASCULARIZATION N/A 10/17/2019   Procedure: Coronary/Graft Acute MI Revascularization;  Surgeon: Nelva Bush, MD;  Location: [place] [facility];  Service: Cardiovascular;  Laterality: N/A;  . ENDOVEIN HARVEST OF  GREATER SAPHENOUS VEIN Right 10/17/2019   Procedure: [place] Of Greater Saphenous Vein;  Surgeon: Rexene Alberts, MD;  Location: [place];  Service: Open Heart Surgery;  Laterality: Right;  . IABP INSERTION N/A 10/17/2019   Procedure: IABP Insertion;  Surgeon: Nelva Bush, MD;  Location: [place] [facility];  Service: Cardiovascular;  Laterality: N/A;  . INCISION AND DRAINAGE ABSCESS N/A 01/29/2020   Procedure: INCISION AND DRAINAGE BILATERAL PERIRECTAL ABSCESS;  Surgeon: Stark Klein, MD;  Location: [place];  Service: General;  Laterality: N/A;  . IR THORACENTESIS ASP PLEURAL SPACE W/IMG GUIDE  10/26/2019  . RIGHT/LEFT HEART CATH AND CORONARY ANGIOGRAPHY N/A 10/17/2019   Procedure: RIGHT/LEFT HEART CATH AND CORONARY ANGIOGRAPHY;  Surgeon: Nelva Bush, MD;  Location: [place] [facility];  Service: Cardiovascular;  Laterality: N/A;  . TEE WITHOUT CARDIOVERSION  10/17/2019   Procedure: Transesophageal Echocardiogram (Tee);  Surgeon: Rexene Alberts, MD;  Location: [facility] OR;  Service: Open Heart Surgery;;    Family History  Problem Relation Age of Onset  . Heart disease Mother   . Alcohol abuse Father   . Hyperlipidemia Sister   . Hypertension Sister   . Hyperlipidemia Brother   . Hypertension Brother   . Cancer Brother        ?lung cancer  . Stomach cancer Brother   . Diabetes Paternal Uncle   . Colon cancer Neg Hx   . Esophageal cancer Neg Hx   . Rectal cancer Neg Hx     Social History   Socioeconomic History  . Marital status: Married    Spouse name: Not on file  . Number of children: 5  . Years of education: Not on file  . Highest education level: Not on file  Occupational History  . Occupation: Engineer, production for [employer]    Comment: retired  Tobacco Use  . Smoking status: Former Smoker    Packs/day: 1.00    Years: 20.00    Pack years: 20.00    Types: Cigarettes    Quit date: 02/26/1993    Years since quitting: 27.1  . Smokeless tobacco: Never  Used  . Tobacco comment: QUIT SMOKING 20 YEARS AGO  Vaping Use  . Vaping Use: Never used  Substance and Sexual Activity  . Alcohol use: No  . Drug use: No  . Sexual activity: Not on file  Other Topics Concern  . Not on file  Social History Narrative   No living will   Wife should make health care decisions--alternate is sons   Would accept resuscitation   Not sure about tube feeds   Social Determinants of Health   Financial Resource Strain:   . Difficulty of Paying Living Expenses: Not on file  Food Insecurity: No Food Insecurity  . Worried  About Running Out of Food in the Last Year: Never true  . Ran Out of Food in the Last Year: Never true  Transportation Needs: No Transportation Needs  . Lack of Transportation (Medical): No  . Lack of Transportation (Non-Medical): No  Physical Activity: Insufficiently Active  . Days of Exercise per Week: 4 days  . Minutes of Exercise per Session: 10 min  Stress:   . Feeling of Stress : Not on file  Social Connections:   . Frequency of Communication with Friends and Family: Not on file  . Frequency of Social Gatherings with Friends and Family: Not on file  . Attends Religious Services: Not on file  . Active Member of Clubs or Organizations: Not on file  . Attends Archivist Meetings: Not on file  . Marital Status: Not on file  Intimate Partner Violence:   . Fear of Current or Ex-Partner: Not on file  . Emotionally Abused: Not on file  . Physically Abused: Not on file  . Sexually Abused: Not on file      Review of Systems Sleeps in bed--flat. No PND now (this sent him to the ER recently) Appetite is pretty good Has lost 20# from before the heart surgery Legs feel numb all the time    Objective:   Physical Exam Constitutional:      Appearance: Normal appearance.  Cardiovascular:     Rate and Rhythm: Normal rate and regular rhythm.     Pulses: Normal pulses.     Heart sounds: No gallop.      Comments: Very slight  systolic murmur at apex--not really along LSB Pulmonary:     Effort: Pulmonary effort is normal.     Breath sounds: No wheezing or rales.     Comments: Slightly decreased breath sounds at right base (not new) Musculoskeletal:     Cervical back: Neck supple.     Right lower leg: No edema.     Left lower leg: No edema.  Lymphadenopathy:     Cervical: No cervical adenopathy.  Neurological:     Mental Status: He is alert.  Psychiatric:        Mood and Affect: Mood normal.        Behavior: Behavior normal.            Assessment & Plan:

## 2020-04-08 NOTE — Assessment & Plan Note (Signed)
Puzzling evolution of echo studies from TEE intraoperative and thoracic one post op to recent echo in hospital Now with dilated RA and LA and severe TR/MR which was not there before Doesn't have the murmurs associated with severe regurgitation Will need further evaluation to confirm these findings

## 2020-04-08 NOTE — Assessment & Plan Note (Signed)
This is borderline by echo so may have diastolic component as well Discussed weight based furosemide dosing to maintain dry weight Now has stopped salt intake Weight daily Going back to the cardiologist

## 2020-04-26 ENCOUNTER — Emergency Department (HOSPITAL_COMMUNITY): Payer: No Typology Code available for payment source

## 2020-04-26 ENCOUNTER — Inpatient Hospital Stay (HOSPITAL_COMMUNITY)
Admission: EM | Admit: 2020-04-26 | Discharge: 2020-05-23 | DRG: 001 | Disposition: A | Discharge status: Rehabilitation Facility | Payer: No Typology Code available for payment source | Attending: Cardiology | Admitting: Cardiology

## 2020-04-26 ENCOUNTER — Encounter (HOSPITAL_COMMUNITY): Payer: Self-pay

## 2020-04-26 ENCOUNTER — Other Ambulatory Visit: Payer: Self-pay

## 2020-04-26 DIAGNOSIS — I252 Old myocardial infarction: Secondary | ICD-10-CM

## 2020-04-26 DIAGNOSIS — I9789 Other postprocedural complications and disorders of the circulatory system, not elsewhere classified: Secondary | ICD-10-CM | POA: Diagnosis not present

## 2020-04-26 DIAGNOSIS — E43 Unspecified severe protein-calorie malnutrition: Secondary | ICD-10-CM | POA: Diagnosis not present

## 2020-04-26 DIAGNOSIS — E1165 Type 2 diabetes mellitus with hyperglycemia: Secondary | ICD-10-CM | POA: Diagnosis not present

## 2020-04-26 DIAGNOSIS — Z951 Presence of aortocoronary bypass graft: Secondary | ICD-10-CM | POA: Diagnosis not present

## 2020-04-26 DIAGNOSIS — J90 Pleural effusion, not elsewhere classified: Secondary | ICD-10-CM | POA: Diagnosis present

## 2020-04-26 DIAGNOSIS — I480 Paroxysmal atrial fibrillation: Secondary | ICD-10-CM | POA: Diagnosis present

## 2020-04-26 DIAGNOSIS — E871 Hypo-osmolality and hyponatremia: Secondary | ICD-10-CM | POA: Diagnosis not present

## 2020-04-26 DIAGNOSIS — E1122 Type 2 diabetes mellitus with diabetic chronic kidney disease: Secondary | ICD-10-CM | POA: Diagnosis present

## 2020-04-26 DIAGNOSIS — J849 Interstitial pulmonary disease, unspecified: Secondary | ICD-10-CM | POA: Diagnosis present

## 2020-04-26 DIAGNOSIS — N179 Acute kidney failure, unspecified: Secondary | ICD-10-CM

## 2020-04-26 DIAGNOSIS — I130 Hypertensive heart and chronic kidney disease with heart failure and stage 1 through stage 4 chronic kidney disease, or unspecified chronic kidney disease: Principal | ICD-10-CM | POA: Diagnosis present

## 2020-04-26 DIAGNOSIS — E782 Mixed hyperlipidemia: Secondary | ICD-10-CM

## 2020-04-26 DIAGNOSIS — I509 Heart failure, unspecified: Secondary | ICD-10-CM

## 2020-04-26 DIAGNOSIS — I493 Ventricular premature depolarization: Secondary | ICD-10-CM | POA: Diagnosis not present

## 2020-04-26 DIAGNOSIS — J9601 Acute respiratory failure with hypoxia: Secondary | ICD-10-CM | POA: Diagnosis not present

## 2020-04-26 DIAGNOSIS — I472 Ventricular tachycardia: Secondary | ICD-10-CM | POA: Diagnosis present

## 2020-04-26 DIAGNOSIS — I5021 Acute systolic (congestive) heart failure: Secondary | ICD-10-CM | POA: Diagnosis not present

## 2020-04-26 DIAGNOSIS — D62 Acute posthemorrhagic anemia: Secondary | ICD-10-CM | POA: Diagnosis not present

## 2020-04-26 DIAGNOSIS — Y838 Other surgical procedures as the cause of abnormal reaction of the patient, or of later complication, without mention of misadventure at the time of the procedure: Secondary | ICD-10-CM | POA: Diagnosis not present

## 2020-04-26 DIAGNOSIS — Z811 Family history of alcohol abuse and dependence: Secondary | ICD-10-CM

## 2020-04-26 DIAGNOSIS — F32A Depression, unspecified: Secondary | ICD-10-CM | POA: Diagnosis present

## 2020-04-26 DIAGNOSIS — I2699 Other pulmonary embolism without acute cor pulmonale: Secondary | ICD-10-CM | POA: Diagnosis not present

## 2020-04-26 DIAGNOSIS — R918 Other nonspecific abnormal finding of lung field: Secondary | ICD-10-CM | POA: Diagnosis present

## 2020-04-26 DIAGNOSIS — K5901 Slow transit constipation: Secondary | ICD-10-CM | POA: Diagnosis not present

## 2020-04-26 DIAGNOSIS — Z9911 Dependence on respirator [ventilator] status: Secondary | ICD-10-CM | POA: Diagnosis not present

## 2020-04-26 DIAGNOSIS — M3212 Pericarditis in systemic lupus erythematosus: Secondary | ICD-10-CM | POA: Diagnosis present

## 2020-04-26 DIAGNOSIS — I255 Ischemic cardiomyopathy: Secondary | ICD-10-CM | POA: Diagnosis present

## 2020-04-26 DIAGNOSIS — R64 Cachexia: Secondary | ICD-10-CM | POA: Diagnosis not present

## 2020-04-26 DIAGNOSIS — F419 Anxiety disorder, unspecified: Secondary | ICD-10-CM | POA: Diagnosis present

## 2020-04-26 DIAGNOSIS — Z7901 Long term (current) use of anticoagulants: Secondary | ICD-10-CM

## 2020-04-26 DIAGNOSIS — R112 Nausea with vomiting, unspecified: Secondary | ICD-10-CM | POA: Diagnosis not present

## 2020-04-26 DIAGNOSIS — Z8719 Personal history of other diseases of the digestive system: Secondary | ICD-10-CM

## 2020-04-26 DIAGNOSIS — I5043 Acute on chronic combined systolic (congestive) and diastolic (congestive) heart failure: Secondary | ICD-10-CM | POA: Diagnosis not present

## 2020-04-26 DIAGNOSIS — R0602 Shortness of breath: Secondary | ICD-10-CM

## 2020-04-26 DIAGNOSIS — R059 Cough, unspecified: Secondary | ICD-10-CM

## 2020-04-26 DIAGNOSIS — D849 Immunodeficiency, unspecified: Secondary | ICD-10-CM | POA: Diagnosis present

## 2020-04-26 DIAGNOSIS — Z833 Family history of diabetes mellitus: Secondary | ICD-10-CM

## 2020-04-26 DIAGNOSIS — I25118 Atherosclerotic heart disease of native coronary artery with other forms of angina pectoris: Secondary | ICD-10-CM | POA: Diagnosis not present

## 2020-04-26 DIAGNOSIS — Z20822 Contact with and (suspected) exposure to covid-19: Secondary | ICD-10-CM | POA: Diagnosis present

## 2020-04-26 DIAGNOSIS — N400 Enlarged prostate without lower urinary tract symptoms: Secondary | ICD-10-CM | POA: Diagnosis present

## 2020-04-26 DIAGNOSIS — L89159 Pressure ulcer of sacral region, unspecified stage: Secondary | ICD-10-CM | POA: Diagnosis present

## 2020-04-26 DIAGNOSIS — I5082 Biventricular heart failure: Secondary | ICD-10-CM | POA: Diagnosis present

## 2020-04-26 DIAGNOSIS — J439 Emphysema, unspecified: Secondary | ICD-10-CM | POA: Diagnosis present

## 2020-04-26 DIAGNOSIS — Z801 Family history of malignant neoplasm of trachea, bronchus and lung: Secondary | ICD-10-CM

## 2020-04-26 DIAGNOSIS — I4892 Unspecified atrial flutter: Secondary | ICD-10-CM | POA: Diagnosis not present

## 2020-04-26 DIAGNOSIS — I5023 Acute on chronic systolic (congestive) heart failure: Secondary | ICD-10-CM | POA: Diagnosis not present

## 2020-04-26 DIAGNOSIS — I110 Hypertensive heart disease with heart failure: Secondary | ICD-10-CM | POA: Diagnosis present

## 2020-04-26 DIAGNOSIS — I340 Nonrheumatic mitral (valve) insufficiency: Secondary | ICD-10-CM | POA: Diagnosis not present

## 2020-04-26 DIAGNOSIS — R069 Unspecified abnormalities of breathing: Secondary | ICD-10-CM

## 2020-04-26 DIAGNOSIS — R579 Shock, unspecified: Secondary | ICD-10-CM | POA: Diagnosis not present

## 2020-04-26 DIAGNOSIS — M329 Systemic lupus erythematosus, unspecified: Secondary | ICD-10-CM | POA: Diagnosis present

## 2020-04-26 DIAGNOSIS — R7989 Other specified abnormal findings of blood chemistry: Secondary | ICD-10-CM | POA: Diagnosis not present

## 2020-04-26 DIAGNOSIS — Z800 Family history of malignant neoplasm of digestive organs: Secondary | ICD-10-CM

## 2020-04-26 DIAGNOSIS — Z452 Encounter for adjustment and management of vascular access device: Secondary | ICD-10-CM

## 2020-04-26 DIAGNOSIS — J811 Chronic pulmonary edema: Secondary | ICD-10-CM

## 2020-04-26 DIAGNOSIS — Z87891 Personal history of nicotine dependence: Secondary | ICD-10-CM

## 2020-04-26 DIAGNOSIS — I351 Nonrheumatic aortic (valve) insufficiency: Secondary | ICD-10-CM | POA: Diagnosis not present

## 2020-04-26 DIAGNOSIS — R0600 Dyspnea, unspecified: Secondary | ICD-10-CM | POA: Diagnosis not present

## 2020-04-26 DIAGNOSIS — G44229 Chronic tension-type headache, not intractable: Secondary | ICD-10-CM | POA: Diagnosis present

## 2020-04-26 DIAGNOSIS — J918 Pleural effusion in other conditions classified elsewhere: Secondary | ICD-10-CM | POA: Diagnosis present

## 2020-04-26 DIAGNOSIS — Z9889 Other specified postprocedural states: Secondary | ICD-10-CM

## 2020-04-26 DIAGNOSIS — E876 Hypokalemia: Secondary | ICD-10-CM | POA: Diagnosis not present

## 2020-04-26 DIAGNOSIS — R570 Cardiogenic shock: Secondary | ICD-10-CM | POA: Diagnosis not present

## 2020-04-26 DIAGNOSIS — Z419 Encounter for procedure for purposes other than remedying health state, unspecified: Secondary | ICD-10-CM

## 2020-04-26 DIAGNOSIS — M3213 Lung involvement in systemic lupus erythematosus: Secondary | ICD-10-CM | POA: Diagnosis present

## 2020-04-26 DIAGNOSIS — I5022 Chronic systolic (congestive) heart failure: Secondary | ICD-10-CM | POA: Diagnosis not present

## 2020-04-26 DIAGNOSIS — N183 Chronic kidney disease, stage 3 unspecified: Secondary | ICD-10-CM | POA: Diagnosis present

## 2020-04-26 DIAGNOSIS — Z515 Encounter for palliative care: Secondary | ICD-10-CM | POA: Diagnosis not present

## 2020-04-26 DIAGNOSIS — T82855A Stenosis of coronary artery stent, initial encounter: Secondary | ICD-10-CM | POA: Diagnosis present

## 2020-04-26 DIAGNOSIS — Z681 Body mass index (BMI) 19 or less, adult: Secondary | ICD-10-CM

## 2020-04-26 DIAGNOSIS — Z83438 Family history of other disorder of lipoprotein metabolism and other lipidemia: Secondary | ICD-10-CM

## 2020-04-26 DIAGNOSIS — E785 Hyperlipidemia, unspecified: Secondary | ICD-10-CM | POA: Diagnosis present

## 2020-04-26 DIAGNOSIS — M533 Sacrococcygeal disorders, not elsewhere classified: Secondary | ICD-10-CM | POA: Diagnosis not present

## 2020-04-26 DIAGNOSIS — J189 Pneumonia, unspecified organism: Secondary | ICD-10-CM | POA: Diagnosis not present

## 2020-04-26 DIAGNOSIS — Y828 Other medical devices associated with adverse incidents: Secondary | ICD-10-CM | POA: Diagnosis present

## 2020-04-26 DIAGNOSIS — I2510 Atherosclerotic heart disease of native coronary artery without angina pectoris: Secondary | ICD-10-CM | POA: Diagnosis present

## 2020-04-26 DIAGNOSIS — R300 Dysuria: Secondary | ICD-10-CM | POA: Diagnosis not present

## 2020-04-26 DIAGNOSIS — Z95811 Presence of heart assist device: Secondary | ICD-10-CM | POA: Diagnosis not present

## 2020-04-26 DIAGNOSIS — I081 Rheumatic disorders of both mitral and tricuspid valves: Secondary | ICD-10-CM | POA: Diagnosis present

## 2020-04-26 DIAGNOSIS — R5381 Other malaise: Secondary | ICD-10-CM | POA: Diagnosis not present

## 2020-04-26 DIAGNOSIS — Z79899 Other long term (current) drug therapy: Secondary | ICD-10-CM

## 2020-04-26 DIAGNOSIS — Z8249 Family history of ischemic heart disease and other diseases of the circulatory system: Secondary | ICD-10-CM

## 2020-04-26 DIAGNOSIS — R0682 Tachypnea, not elsewhere classified: Secondary | ICD-10-CM | POA: Diagnosis present

## 2020-04-26 DIAGNOSIS — Z7189 Other specified counseling: Secondary | ICD-10-CM | POA: Diagnosis not present

## 2020-04-26 DIAGNOSIS — R627 Adult failure to thrive: Secondary | ICD-10-CM | POA: Diagnosis present

## 2020-04-26 DIAGNOSIS — J810 Acute pulmonary edema: Secondary | ICD-10-CM | POA: Diagnosis not present

## 2020-04-26 DIAGNOSIS — Z7982 Long term (current) use of aspirin: Secondary | ICD-10-CM

## 2020-04-26 DIAGNOSIS — I469 Cardiac arrest, cause unspecified: Secondary | ICD-10-CM | POA: Diagnosis not present

## 2020-04-26 DIAGNOSIS — Z4659 Encounter for fitting and adjustment of other gastrointestinal appliance and device: Secondary | ICD-10-CM

## 2020-04-26 DIAGNOSIS — D696 Thrombocytopenia, unspecified: Secondary | ICD-10-CM | POA: Diagnosis not present

## 2020-04-26 LAB — CBC
HCT: 40.1 % (ref 39.0–52.0)
Hemoglobin: 11.9 g/dL — ABNORMAL LOW (ref 13.0–17.0)
MCH: 26.7 pg (ref 26.0–34.0)
MCHC: 29.7 g/dL — ABNORMAL LOW (ref 30.0–36.0)
MCV: 89.9 fL (ref 80.0–100.0)
Platelets: 187 10*3/uL (ref 150–400)
RBC: 4.46 MIL/uL (ref 4.22–5.81)
RDW: 17.9 % — ABNORMAL HIGH (ref 11.5–15.5)
WBC: 4.1 10*3/uL (ref 4.0–10.5)
nRBC: 0 % (ref 0.0–0.2)

## 2020-04-26 LAB — BASIC METABOLIC PANEL
Anion gap: 13 (ref 5–15)
BUN: 18 mg/dL (ref 8–23)
CO2: 27 mmol/L (ref 22–32)
Calcium: 9 mg/dL (ref 8.9–10.3)
Chloride: 101 mmol/L (ref 98–111)
Creatinine, Ser: 1.45 mg/dL — ABNORMAL HIGH (ref 0.61–1.24)
GFR, Estimated: 51 mL/min — ABNORMAL LOW (ref >60)
Glucose, Bld: 96 mg/dL (ref 70–99)
Potassium: 3.7 mmol/L (ref 3.5–5.1)
Sodium: 141 mmol/L (ref 135–145)

## 2020-04-26 LAB — RESPIRATORY PANEL BY RT PCR (FLU A&B, COVID)
Influenza A by PCR: NEGATIVE
Influenza B by PCR: NEGATIVE
SARS Coronavirus 2 by RT PCR: NEGATIVE

## 2020-04-26 LAB — BRAIN NATRIURETIC PEPTIDE: B Natriuretic Peptide: 1824.4 pg/mL — ABNORMAL HIGH (ref 0.0–100.0)

## 2020-04-26 MED ORDER — FUROSEMIDE 10 MG/ML IJ SOLN
40.0000 mg | Freq: Once | INTRAMUSCULAR | Status: DC
  Filled 2020-04-26: qty 4

## 2020-04-26 MED ORDER — IOHEXOL 350 MG/ML SOLN
75.0000 mL | Freq: Once | INTRAVENOUS | Status: AC | PRN
  Administered 2020-04-26: 75 mL via INTRAVENOUS

## 2020-04-26 MED ORDER — POLYETHYLENE GLYCOL 3350 17 G PO PACK
17.0000 g | PACK | Freq: Every day | ORAL | Status: DC | PRN
  Administered 2020-04-30: 17 g via ORAL
  Filled 2020-04-26: qty 1

## 2020-04-26 MED ORDER — FUROSEMIDE 10 MG/ML IJ SOLN
40.0000 mg | Freq: Once | INTRAMUSCULAR | Status: AC
  Administered 2020-04-26: 40 mg via INTRAVENOUS

## 2020-04-26 MED ORDER — ROSUVASTATIN CALCIUM 20 MG PO TABS
20.0000 mg | ORAL_TABLET | Freq: Every day | ORAL | Status: DC
  Administered 2020-04-27 – 2020-05-02 (×6): 20 mg via ORAL
  Filled 2020-04-26 (×6): qty 1

## 2020-04-26 MED ORDER — ONDANSETRON HCL 4 MG/2ML IJ SOLN
4.0000 mg | Freq: Four times a day (QID) | INTRAMUSCULAR | Status: DC | PRN
  Administered 2020-04-27 – 2020-05-03 (×8): 4 mg via INTRAVENOUS
  Filled 2020-04-26 (×7): qty 2

## 2020-04-26 NOTE — H&P (Signed)
History and Physical  Cody Arellano HGD:[id number] DOB: [birth year] DOA: 04/26/2020  Referring physician: Delia Heady, PA-C PCP: Venia Carbon, MD  Patient coming from: Home  Chief Complaint: Shortness of breath  HPI: Cody Arellano is a 74 y.o. male with medical history significant for lupus, interstitial lung disease, coronary artery disease, chronic systolic CHF, paroxysmal atrial fibrillation on Eliquis, and recent PE who presents to the emergency department due to shortness of breath that has been ongoing for about 2 weeks, this was associated with nausea and intermittent nonbilious, nonbloody vomiting (about 2-3 times per week).  Shortness of breath worsens with exertion and states that he could barely walk 10 feet without being short of breath, he endorsed about 20 pound unintentional weight loss since May 2021.  Patient was recently admitted/4/21 due to acute on chronic systolic CHF.  Denies fever, chills, headache, chest pain or abdominal pain.  ED Course: In the emergency department, patient was tachypneic, otherwise he was hemodynamically stable.  Work-up in the ED showed normocytic anemia, normal BMP except for elevated creatinine at 1.45, baseline creatinine 1.0-1.2) elevated BNP 1824.4 (1677.6-2 weeks ago; 1543.3-1 month ago). CT chest with contrast showed poor opacification of the bilateral lower lobe pulmonary arteries. Differential considerations include underlying pulmonary emboli as before versus suboptimal opacification secondary to contrast bolus timing.  Large right-sided pleural effusion and small left-sided pleural effusion also noted.  Chest x-ray showed cardiomegaly with pulmonary venous congestion and bilateral interstitial prominence most consistent with interstitial edema.  IV Lasix 40 Mg x1 was given.  Hospitalist was asked to admit.  For further evaluation and management.  Review of Systems: Constitutional: Negative for chills and fever.  HENT: Negative for ear pain  and sore throat.   Eyes: Negative for pain and visual disturbance.  Respiratory: Positive for shortness of breath.  Negative for cough, chest tightness    Cardiovascular: Negative for chest pain and palpitations.  Gastrointestinal: Positive for nausea and vomiting.  Negative for abdominal pain Endocrine: Negative for polyphagia and polyuria.  Genitourinary: Negative for decreased urine volume, dysuria, enuresis Musculoskeletal: Negative for arthralgias and back pain.  Skin: Negative for color change and rash.  Allergic/Immunologic: Negative for immunocompromised state.  Neurological: Negative for tremors, syncope, speech difficulty, weakness, light-headedness and headaches.  Hematological: Does not bruise/bleed easily.  All other systems reviewed and are negative  Past Medical History:  Diagnosis Date  . Anxiety   . CHF (congestive heart failure) ([place])   . Chronic tension headache   . Colon polyps   . Coronary artery disease 2008/2009   MI with PCI x 2, then PCI x 1 in 2009  . Depression   . Dyslipidemia   . Dysrhythmia    atrial fibrillation  . Hyperlipidemia   . Hypertension   . Lupus [facility])    sees Dr Trudie Reed  . Lupus disease of the lung   . 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 Myocardial infarction ([place]) 2008  . 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 Shortness of breath    Past Surgical History:  Procedure Laterality Date  . CARDIAC CATHETERIZATION    . CLIPPING OF ATRIAL APPENDAGE N/A 10/17/2019   Procedure: Clipping Of Atrial Appendage using AtriCure [id number] 45 MM AtriClip.;  Surgeon: Rexene Alberts, MD;  Location: [place];  Service: Open Heart Surgery;  Laterality: N/A;  . CORONARY ARTERY BYPASS GRAFT N/A 10/17/2019   Procedure: CORONARY  ARTERY BYPASS GRAFTING (CABG) using LIMA to LAD; Endoscopic harvest right greater saphenous vein: SVG to RAMUS.;  Surgeon: Rexene Alberts, MD;  Location: [place];  Service: Open Heart  Surgery;  Laterality: N/A;  . CORONARY BALLOON ANGIOPLASTY N/A 10/17/2019   Procedure: CORONARY BALLOON ANGIOPLASTY;  Surgeon: Nelva Bush, MD;  Location: [place] CV LAB;  Service: Cardiovascular;  Laterality: N/A;  . coronary stents  2009  . CORONARY/GRAFT ACUTE MI REVASCULARIZATION N/A 10/17/2019   Procedure: Coronary/Graft Acute MI Revascularization;  Surgeon: Nelva Bush, MD;  Location: [place] CV LAB;  Service: Cardiovascular;  Laterality: N/A;  . ENDOVEIN HARVEST OF GREATER SAPHENOUS VEIN Right 10/17/2019   Procedure: [place] Of Greater Saphenous Vein;  Surgeon: Rexene Alberts, MD;  Location: [place];  Service: Open Heart Surgery;  Laterality: Right;  . IABP INSERTION N/A 10/17/2019   Procedure: IABP Insertion;  Surgeon: Nelva Bush, MD;  Location: [place] CV LAB;  Service: Cardiovascular;  Laterality: N/A;  . INCISION AND DRAINAGE ABSCESS N/A 01/29/2020   Procedure: INCISION AND DRAINAGE BILATERAL PERIRECTAL ABSCESS;  Surgeon: Stark Klein, MD;  Location: [place];  Service: General;  Laterality: N/A;  . IR THORACENTESIS ASP PLEURAL SPACE W/IMG GUIDE  10/26/2019  . RIGHT/LEFT HEART CATH AND CORONARY ANGIOGRAPHY N/A 10/17/2019   Procedure: RIGHT/LEFT HEART CATH AND CORONARY ANGIOGRAPHY;  Surgeon: Nelva Bush, MD;  Location: [place] CV LAB;  Service: Cardiovascular;  Laterality: N/A;  . TEE WITHOUT CARDIOVERSION  10/17/2019   Procedure: Transesophageal Echocardiogram (Tee);  Surgeon: Rexene Alberts, MD;  Location: [facility] OR;  Service: Open Heart Surgery;;    Social History:  reports that he quit smoking about 27 years ago. His smoking use included cigarettes. He has a 20.00 pack-year smoking history. He has never used smokeless tobacco. He reports that he does not drink alcohol and does not use drugs.   No Known Allergies  Family History  Problem Relation Age of Onset  . Heart disease Mother   . Alcohol abuse Father   . Hyperlipidemia Sister   .  Hypertension Sister   . Hyperlipidemia Brother   . Hypertension Brother   . Cancer Brother        ?lung cancer  . Stomach cancer Brother   . Diabetes Paternal Uncle   . Colon cancer Neg Hx   . Esophageal cancer Neg Hx   . Rectal cancer Neg Hx     Prior to Admission medications   Medication Sig Start Date End Date Taking? Authorizing Provider  apixaban (ELIQUIS) 5 MG TABS tablet Take 1 tablet (5 mg total) by mouth 2 (two) times daily. 04/01/20 05/01/20 Yes Pahwani, Einar Grad, MD  aspirin EC 81 MG tablet Take 1 tablet (81 mg total) by mouth daily. 08/10/14  Yes Nahser, Wonda Cheng, MD  azaTHIOprine (IMURAN) 50 MG tablet Take 100 mg by mouth 2 (two) times daily.  10/11/14  Yes [provider]  furosemide (LASIX) 40 MG tablet Please take 40 mg oral daily, and take extra 40 mg of Lasix if you gain 3 pounds have worsening lower extremity edema for 1 day. 04/07/20  Yes Elgergawy, Silver Huguenin, MD  hydroxychloroquine (PLAQUENIL) 200 MG tablet Take 400 mg by mouth at bedtime.    Yes [provider]  oxyCODONE (OXY IR/ROXICODONE) 5 MG immediate release tablet Take 1 tablet (5 mg total) by mouth every 6 (six) hours as needed for severe pain. 02/02/20  Yes Rai, Vernelle Emerald, MD  polyethylene glycol (MIRALAX / GLYCOLAX)  17 g packet Take 17 g by mouth daily as needed for moderate constipation.    Yes [provider]  polyvinyl alcohol (ARTIFICIAL TEARS) 1.4 % ophthalmic solution Place 2 drops into both eyes daily.    Yes [provider]  rosuvastatin (CRESTOR) 20 MG tablet Take 20 mg by mouth daily.   Yes [provider]  tamsulosin (FLOMAX) 0.4 MG CAPS capsule Take 0.4 mg by mouth every evening.  04/02/17  Yes [provider]    Physical Exam: BP 102/64 (BP Location: Right Arm)   Pulse 64   Temp 97.8 F (36.6 C) (Oral)   Resp 18   Ht 6' (1.829 m)   Wt 66.7 kg   SpO2 93%   BMI 19.94 kg/m   . General: 74 y.o. year-old male well developed well nourished in no  acute distress.  Alert and oriented x3. [name] HEENT: NCAT, EOMI . Neck: Supple, trachea medial . Cardiovascular: Regular rate and rhythm with no rubs or gallops.  No thyromegaly or JVD noted.  No lower extremity edema. 2/4 pulses in all 4 extremities. [name] Respiratory: Rales in lower lobes bilaterally (R > L).  No wheezes or rales.  . Abdomen: Soft nontender nondistended with normal bowel sounds x4 quadrants. . Muskuloskeletal: No cyanosis, clubbing or edema noted bilaterally . Neuro: CN II-XII intact, strength, sensation, reflexes . Skin: No ulcerative lesions noted or rashes . Psychiatry: Judgement and insight appear normal. Mood is appropriate for condition and setting          Labs on Admission:  Basic Metabolic Panel: Recent Labs  Lab 04/26/20 1319  NA 141  K 3.7  CL 101  CO2 27  GLUCOSE 96  BUN 18  CREATININE 1.45*  CALCIUM 9.0   Liver Function Tests: No results for input(s): AST, ALT, ALKPHOS, BILITOT, PROT, ALBUMIN in the last 168 hours. No results for input(s): LIPASE, AMYLASE in the last 168 hours. No results for input(s): AMMONIA in the last 168 hours. CBC: Recent Labs  Lab 04/26/20 1319  WBC 4.1  HGB 11.9*  HCT 40.1  MCV 89.9  PLT 187   Cardiac Enzymes: No results for input(s): CKTOTAL, CKMB, CKMBINDEX, TROPONINI in the last 168 hours.  BNP (last 3 results) Recent Labs    03/23/20 0418 04/06/20 2134 04/26/20 1319  BNP 1,543.3* 1,677.6* 1,824.4*    ProBNP (last 3 results) No results for input(s): PROBNP in the last 8760 hours.  CBG: No results for input(s): GLUCAP in the last 168 hours.  Radiological Exams on Admission: DG Chest 2 View  Result Date: 04/26/2020 CLINICAL DATA:  Shortness of breath.  Chest pain. EXAM: CHEST - 2 VIEW COMPARISON:  04/06/2020. FINDINGS: Mediastinum hilar structures stable. Surgical clips and left atrial appendage clip noted over the stable cardiomegaly scratched it cardiomegaly with pulmonary venous congestion again noted.  Bilateral interstitial prominence most consistent interstitial edema again noted. Interim progression from prior exam. Small right pleural effusion again noted. No pneumothorax. Thoracic spine scoliosis. IMPRESSION: Cardiomegaly with pulmonary venous congestion and bilateral interstitial prominence most consistent with interstitial edema. Interim progression from prior exam. Small right pleural effusion again noted. Electronically Signed   By: Marcello Moores  Register   On: 04/26/2020 13:46   CT Angio Chest PE W/Cm &/Or Wo Cm  Result Date: 04/26/2020 CLINICAL DATA:  Shortness of breath. History of congestive heart failure and PE. EXAM: CT ANGIOGRAPHY CHEST WITH CONTRAST TECHNIQUE: Multidetector CT imaging of the chest was performed using the standard protocol during bolus  administration of intravenous contrast. Multiplanar CT image reconstructions and MIPs were obtained to evaluate the vascular anatomy. CONTRAST:  78mL OMNIPAQUE IOHEXOL 350 MG/ML SOLN COMPARISON:  03/23/2020 FINDINGS: Cardiovascular: Contrast injection is sufficient to demonstrate satisfactory opacification of the pulmonary arteries to the segmental level. There is suboptimal opacification of the lobar, segmental, and subsegmental pulmonary artery branches of the right lower lobe. There is suboptimal opacification of the subsegmental branches of the left lower lobe. The size of the main pulmonary artery is enlarged, measuring 3.8 cm. Moderate cardiomegaly. There is reflux of contrast into the IVC. There are atherosclerotic changes of the thoracic aorta without evidence for an aneurysm. Coronary artery calcifications are noted. Mediastinum/Nodes: -- No mediastinal lymphadenopathy. -- No hilar lymphadenopathy. -- No axillary lymphadenopathy. -- No supraclavicular lymphadenopathy. -- Normal thyroid gland where visualized. -  Unremarkable esophagus. Lungs/Pleura: There is a large right-sided pleural effusion. Emphysematous changes are noted bilaterally.  There is a 1.1 cm pulmonary nodule in the left upper lobe which has increased in size from the prior study (axial series 6, image 27). There is a ground-glass airspace opacity in the left upper lobe measuring approximately 2.1 cm which has increased in size from the prior study when it measured approximately 1.3 cm (axial series 6, image 55) parent there is diffuse bronchial wall thickening and mucus plugging bilaterally. There is atelectasis versus consolidation in the right lower lobe. There is a 9 mm pulmonary nodule in the right middle lobe, increased in size from prior study (axial series 6, image 86). There is a small left-sided pleural effusion. Upper Abdomen: Contrast bolus timing is not optimized for evaluation of the abdominal organs. There is a small volume of free fluid in the upper abdomen. There are few scattered colonic diverticula. Musculoskeletal: No chest wall abnormality. No bony spinal canal stenosis. Review of the MIP images confirms the above findings. IMPRESSION: 1. Again noted is poor opacification of the bilateral lower lobe pulmonary arteries. Differential considerations include underlying pulmonary emboli as before versus suboptimal opacification secondary to contrast bolus timing. 2. Large right-sided pleural effusion. There is a small left-sided pleural effusion. 3. Cardiomegaly with reflux of contrast in the IVC consistent with underlying cardiac dysfunction. 4. Bilateral bronchial wall thickening and mucus plugging consistent with infectious or reactive bronchiolitis. 5. Growing bilateral pulmonary nodules. While these may be infectious or inflammatory in etiology, malignancy is not excluded. Pulmonary medicine consultation is recommended. 6. Small volume ascites in the upper abdomen. Aortic Atherosclerosis (ICD10-I70.0) and Emphysema (ICD10-J43.9). Electronically Signed   By: Constance Holster M.D.   On: 04/26/2020 19:13    EKG: I independently viewed the EKG done and my findings  are as followed: Normal sinus rhythm at a rate of 61 bpm  Assessment/Plan Present on Admission: . Pleural effusion on right . CAD (coronary artery disease) . Paroxysmal atrial fibrillation (HCC) . Heart failure, systolic, chronic ([place]) . Pulmonary embolism ([place]) . Hyperlipemia  Principal Problem:   Pleural effusion on right Active Problems:   CAD (coronary artery disease)   Hyperlipemia   Paroxysmal atrial fibrillation (HCC)   Heart failure, systolic, chronic (HCC)   Pulmonary embolism (HCC)   Shortness of breath   Elevated brain natriuretic peptide (BNP) level   AKI (acute kidney injury) (HCC)   Pulmonary nodules   Nausea & vomiting   Shortness of breath possibly secondary to multifactorial including right pleural effusion, chronic CHF, pulmonary embolism CT angiography of chest showed large right-sided pleural effusion and small left-sided pleural effusion also noted.  Chest x-ray showed cardiomegaly with pulmonary venous congestion and bilateral interstitial prominence most consistent with interstitial edema.  IR will be consulted for possible thoracentesis in the morning Home Eliquis will be held, last Eliquis intake was this morning (11/23) Lasix temporarily held due to soft BP Continue supplemental oxygen via [place] with plan to wean patient off supplemental oxygen as tolerated  Nausea and vomiting  Continue IV Zofran p.r.n.  Pulmonary nodules CT angiography of chest showed growing bilateral pulmonary nodules suspected to be due to infectious or inflammatory in etiology, malignancy is not excluded Pulmonary medicine consultation is recommended  Acute kidney injury BUN/creatinine 18/1.45 (baseline creatinine at 1.0-1.19) Renally adjust medications, avoid nephrotoxic agents/dehydration/hypotension  Hyperlipidemia Continue Crestor  Acute on chronic systolic CHF BNP 0263.7 (8588.5-0 weeks ago; 1543.3-1 month ago) Chest x-ray showed interstitial edema IV Lasix 40 Mg  x1 was given in the ED; Lasix temporarily held due to soft BP  Continue total input/output, daily weights and fluid restriction Continue Cardiac diet  Echocardiogram done on 03/23/20 showed LVEF of 45 to 50% with mildly decreased LV function and regional wall motion abnormalities.  RV systolic function is moderately reduced, RV size is mildly enlarged.  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 Continue Plaquenil and Imuran   DVT prophylaxis: SCDs  Code Status: Full code  Family Communication: Wife at bedside (all questions answered to satisfaction)  Disposition Plan:  Patient is from:                        home Anticipated DC to:                   SNF or family members home Anticipated DC date:               2-3 days Anticipated DC barriers:           Patient is unstable for discharge at this time due to shortness of breath due to multifactorial including right pleural effusion which require possible thoracentesis in the morning   Consults called: Interventional radiologist  Admission status: Inpatient    Bernadette Hoit MD [facility]  04/26/2020, 9:43 PM

## 2020-04-26 NOTE — ED Notes (Signed)
Called CT requesting update regarding pending CT scan. CT tech to call Rn when CT scanner available.

## 2020-04-26 NOTE — ED Triage Notes (Signed)
Pt reports 1 month of sob. Hx of CHF and PE. Denies chest pain at this time. Resp e.u

## 2020-04-26 NOTE — ED Provider Notes (Signed)
[facility] EMERGENCY DEPARTMENT Provider Note   CSN: [id number] Arrival date & time: 04/26/20  1309     History Chief Complaint  Patient presents with  . Shortness of Breath    Cody Arellano is a 74 y.o. male with a past medical history of CHF with an EF of 45 to 50% seen on echo 1 month ago, recent diagnosis of PE currently on Eliquis, hypertension, lupus, prior MI presenting to the ED for shortness of breath.  Patient reports persistent shortness of breath since being discharged from the hospital.  He has been taking his Lasix as prescribed.  He has been checking his weight and has "gained a pound here there but nothing too major."  He sees a cardiologist at the [place] [facility] and was told that he needed to have "some type of procedure to see how my valves are doing."  This has not been scheduled by his cardiologist.  States that his shortness of breath got worse today.  He reports shortness of breath with rest and exertion.  No chest pain.  Has been having vomiting intermittently for the past week.  No abdominal pain, diarrhea.  He reports chronic cough.  No hemoptysis.  No significant leg swelling. He is compliant with his medications.  HPI     Past Medical History:  Diagnosis Date  . Anxiety   . CHF (congestive heart failure) ([place])   . Chronic tension headache   . Colon polyps   . Coronary artery disease 2008/2009   MI with PCI x 2, then PCI x 1 in 2009  . Depression   . Dyslipidemia   . Dysrhythmia    atrial fibrillation  . Hyperlipidemia   . Hypertension   . Lupus [facility])    sees Dr Trudie Reed  . Lupus disease of the lung   . 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 Myocardial infarction ([place]) 2008  . 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 Shortness of breath     Patient Active Problem List   Diagnosis Date Noted  . Valvular heart disease 04/08/2020  . Malnutrition of mild degree ([place]) 04/08/2020  . Acute on  chronic systolic CHF (congestive heart failure) ([place]) 04/07/2020  . Pulmonary embolism ([place]) 03/23/2020  . Heart failure, systolic, chronic ([place]) 35/57/3220  . Fatigue 03/09/2020  . Atherosclerotic heart disease of native coronary artery with angina pectoris ([place]) 03/09/2020  . Protein-calorie malnutrition, severe ([place]) 03/09/2020  . Hypotension 01/29/2020  . Anemia 01/29/2020  . Prostatic hypertrophy 01/29/2020  . Pancreatic lesion 01/29/2020  . Rectal abscess 01/28/2020  . 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 Paroxysmal atrial fibrillation ([place]) 01/20/2019  . Chronic tension headache   . Preventative health care 01/09/2018  . Memory loss 01/09/2018  . Advance directive discussed with patient 01/09/2018  . Hyperlipemia 01/17/2016  . 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 Personal history of colonic polyps 05/20/2013  . Episodic mood disorder ([place]) 05/13/2013  . Systemic lupus erythematosus ([place]) 05/13/2013  . Colon polyps   . CAD (coronary artery disease) 04/09/2013  . 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 ILD (interstitial lung disease) ([place]) 02/04/2013    Past Surgical History:  Procedure Laterality Date  . CARDIAC CATHETERIZATION    . CLIPPING OF ATRIAL APPENDAGE N/A 10/17/2019   Procedure: Clipping Of Atrial Appendage using AtriCure PRO245 45 MM  AtriClip.;  Surgeon: Rexene Alberts, MD;  Location: [facility];  Service: Open Heart Surgery;  Laterality: N/A;  . CORONARY ARTERY BYPASS GRAFT N/A 10/17/2019   Procedure: CORONARY ARTERY BYPASS GRAFTING (CABG) using LIMA to LAD; Endoscopic harvest right greater saphenous vein: SVG to RAMUS.;  Surgeon: Rexene Alberts, MD;  Location: [facility];  Service: Open Heart Surgery;  Laterality: N/A;  . CORONARY BALLOON ANGIOPLASTY N/A 10/17/2019   Procedure: CORONARY BALLOON ANGIOPLASTY;  Surgeon: Nelva Bush, MD;  Location: [facility];  Service: Cardiovascular;   Laterality: N/A;  . coronary stents  2009  . CORONARY/GRAFT ACUTE MI REVASCULARIZATION N/A 10/17/2019   Procedure: Coronary/Graft Acute MI Revascularization;  Surgeon: Nelva Bush, MD;  Location: [facility] LAB;  Service: Cardiovascular;  Laterality: N/A;  . ENDOVEIN HARVEST OF GREATER SAPHENOUS VEIN Right 10/17/2019   Procedure: [place] Of Greater Saphenous Vein;  Surgeon: Rexene Alberts, MD;  Location: [facility];  Service: Open Heart Surgery;  Laterality: Right;  . IABP INSERTION N/A 10/17/2019   Procedure: IABP Insertion;  Surgeon: Nelva Bush, MD;  Location: [facility] CV LAB;  Service: Cardiovascular;  Laterality: N/A;  . INCISION AND DRAINAGE ABSCESS N/A 01/29/2020   Procedure: INCISION AND DRAINAGE BILATERAL PERIRECTAL ABSCESS;  Surgeon: Stark Klein, MD;  Location: [facility];  Service: General;  Laterality: N/A;  . IR THORACENTESIS ASP PLEURAL SPACE W/IMG GUIDE  10/26/2019  . RIGHT/LEFT HEART CATH AND CORONARY ANGIOGRAPHY N/A 10/17/2019   Procedure: RIGHT/LEFT HEART CATH AND CORONARY ANGIOGRAPHY;  Surgeon: Nelva Bush, MD;  Location: [facility] CV LAB;  Service: Cardiovascular;  Laterality: N/A;  . TEE WITHOUT CARDIOVERSION  10/17/2019   Procedure: Transesophageal Echocardiogram (Tee);  Surgeon: Rexene Alberts, MD;  Location: [facility] OR;  Service: Open Heart Surgery;;       Family History  Problem Relation Age of Onset  . Heart disease Mother   . Alcohol abuse Father   . Hyperlipidemia Sister   . Hypertension Sister   . Hyperlipidemia Brother   . Hypertension Brother   . Cancer Brother        ?lung cancer  . Stomach cancer Brother   . Diabetes Paternal Uncle   . Colon cancer Neg Hx   . Esophageal cancer Neg Hx   . Rectal cancer Neg Hx     Social History   Tobacco Use  . Smoking status: Former Smoker    Packs/day: 1.00    Years: 20.00    Pack years: 20.00    Types: Cigarettes    Quit date: 02/26/1993    Years since quitting: 27.1  . Smokeless  tobacco: Never Used  . Tobacco comment: QUIT SMOKING 20 YEARS AGO  Vaping Use  . Vaping Use: Never used  Substance Use Topics  . Alcohol use: No  . Drug use: No    Home Medications Prior to Admission medications   Medication Sig Start Date End Date Taking? Authorizing Provider  apixaban (ELIQUIS) 5 MG TABS tablet Take 1 tablet (5 mg total) by mouth 2 (two) times daily. 04/01/20 05/01/20 Yes Pahwani, Einar Grad, MD  aspirin EC 81 MG tablet Take 1 tablet (81 mg total) by mouth daily. 08/10/14  Yes Nahser, Wonda Cheng, MD  azaTHIOprine (IMURAN) 50 MG tablet Take 100 mg by mouth 2 (two) times daily.  10/11/14  Yes [provider]  furosemide (LASIX) 40 MG tablet Please take 40 mg oral daily, and take extra 40 mg of Lasix if you gain 3 pounds  have worsening lower extremity edema for 1 day. 04/07/20  Yes Elgergawy, Silver Huguenin, MD  hydroxychloroquine (PLAQUENIL) 200 MG tablet Take 400 mg by mouth at bedtime.    Yes [provider]  oxyCODONE (OXY IR/ROXICODONE) 5 MG immediate release tablet Take 1 tablet (5 mg total) by mouth every 6 (six) hours as needed for severe pain. 02/02/20  Yes Rai, Ripudeep K, MD  polyethylene glycol (MIRALAX / GLYCOLAX) 17 g packet Take 17 g by mouth daily as needed for moderate constipation.    Yes [provider]  polyvinyl alcohol (ARTIFICIAL TEARS) 1.4 % ophthalmic solution Place 2 drops into both eyes daily.    Yes [provider]  rosuvastatin (CRESTOR) 20 MG tablet Take 20 mg by mouth daily.   Yes [provider]  tamsulosin (FLOMAX) 0.4 MG CAPS capsule Take 0.4 mg by mouth every evening.  04/02/17  Yes [provider]    Allergies    Patient has no known allergies.  Review of Systems   Review of Systems  Constitutional: Negative for appetite change, chills and fever.  HENT: Negative for ear pain, rhinorrhea, sneezing and sore throat.   Eyes: Negative for photophobia and visual disturbance.  Respiratory: Positive for  cough and shortness of breath. Negative for chest tightness and wheezing.   Cardiovascular: Negative for chest pain and palpitations.  Gastrointestinal: Negative for abdominal pain, blood in stool, constipation, diarrhea, nausea and vomiting.  Genitourinary: Negative for dysuria, hematuria and urgency.  Musculoskeletal: Negative for myalgias.  Skin: Negative for rash.  Neurological: Negative for dizziness, weakness and light-headedness.    Physical Exam Updated Vital Signs BP 99/70   Pulse 63   Temp 97.6 F (36.4 C) (Oral)   Resp 18   Ht 6' (1.829 m)   Wt 66.7 kg   SpO2 98%   BMI 19.94 kg/m   Physical Exam Vitals and nursing note reviewed.  Constitutional:      General: He is not in acute distress.    Appearance: He is well-developed.  HENT:     Head: Normocephalic and atraumatic.     Nose: Nose normal.  Eyes:     General: No scleral icterus.       Left eye: No discharge.     Conjunctiva/sclera: Conjunctivae normal.  Cardiovascular:     Rate and Rhythm: Normal rate and regular rhythm.     Heart sounds: Normal heart sounds. No murmur heard.  No friction rub. No gallop.   Pulmonary:     Effort: Pulmonary effort is normal. No respiratory distress.     Breath sounds: Examination of the right-middle field reveals rales. Examination of the left-middle field reveals rales. Examination of the right-lower field reveals rales. Examination of the left-lower field reveals rales. Rales present.  Abdominal:     General: Bowel sounds are normal. There is no distension.     Palpations: Abdomen is soft.     Tenderness: There is no abdominal tenderness. There is no guarding.  Musculoskeletal:        General: Normal range of motion.     Cervical back: Normal range of motion and neck supple.     Right lower leg: No tenderness.     Left lower leg: No tenderness.  Skin:    General: Skin is warm and dry.     Findings: No rash.  Neurological:     Mental Status: He is alert.     Motor:  No abnormal muscle tone.  Coordination: Coordination normal.     ED Results / Procedures / Treatments   Labs (all labs ordered are listed, but only abnormal results are displayed) Labs Reviewed  BASIC METABOLIC PANEL - Abnormal; Notable for the following components:      Result Value   Creatinine, Ser 1.45 (*)    GFR, Estimated 51 (*)    All other components within normal limits  CBC - Abnormal; Notable for the following components:   Hemoglobin 11.9 (*)    MCHC 29.7 (*)    RDW 17.9 (*)    All other components within normal limits  RESPIRATORY PANEL BY RT PCR (FLU A&B, COVID)  BRAIN NATRIURETIC PEPTIDE    EKG None  Radiology DG Chest 2 View  Result Date: 04/26/2020 CLINICAL DATA:  Shortness of breath.  Chest pain. EXAM: CHEST - 2 VIEW COMPARISON:  04/06/2020. FINDINGS: Mediastinum hilar structures stable. Surgical clips and left atrial appendage clip noted over the stable cardiomegaly scratched it cardiomegaly with pulmonary venous congestion again noted. Bilateral interstitial prominence most consistent interstitial edema again noted. Interim progression from prior exam. Small right pleural effusion again noted. No pneumothorax. Thoracic spine scoliosis. IMPRESSION: Cardiomegaly with pulmonary venous congestion and bilateral interstitial prominence most consistent with interstitial edema. Interim progression from prior exam. Small right pleural effusion again noted. Electronically Signed   By: Marcello Moores  Register   On: 04/26/2020 13:46    Procedures Procedures (including critical care time)  Medications Ordered in ED Medications - No data to display  ED Course  I have reviewed the triage vital signs and the nursing notes.  Pertinent labs & imaging results that were available during my care of the patient were reviewed by me and considered in my medical decision making (see chart for details).  Clinical Course as of Apr 26 1524  Tue Apr 26, 2020  1435 Baseline around 1.1   Creatinine(!): 1.45 [HK]  1435 Shows interstitial edema.  DG Chest 2 View [HK]    Clinical Course User Index [HK] Delia Heady, PA-C   MDM Rules/Calculators/A&P                          74 year old male with a past medical history of CHF with an EF of 45 to 50%, PE is currently on Eliquis, hypertension, lupus presenting to the ED for continued shortness of breath.  Reports this is an ongoing issue for him for the past month.  Symptoms got worse today so he decided to come to the ER.  He has been compliant with his Lasix and Eliquis.  Reports shortness of breath even at rest.  Denies any significant leg swelling or weight gain.  No chest pain, abdominal pain or diarrhea.  He does report several episodes of vomiting over the past week.  On exam abdomen is soft, nontender nondistended.  He has some rales noted in bilateral lung fields but oxygen saturations maintained at 99% on room air.  No lower extremity edema, erythema or calf tenderness.  Speaking in complete sentences without difficulty.  Work appears significant for a slight elevation in creatinine of 1.4 and his baseline is around 1.1.  CBC is unremarkable.  Chest x-ray shows interstitial edema that has progressed from his prior exam a few weeks ago.  BNP pending.  Initially ordered Lasix but his blood pressures decreased to 98 systolic.  Will obtain orthostatic vital signs and reassess after labwork and repeat CT scan to evaluate for worsening of his  PE causing this progressive shortness of breath. Consider admission of he remains hypotensive as will be difficult to diurese him if he is hypotensive. Care handed off to oncoming provider pending remainder of workup.   Portions of this note were generated with Lobbyist. Dictation errors may occur despite best attempts at proofreading.  Final Clinical Impression(s) / ED Diagnoses Final diagnoses:  Acute on chronic congestive heart failure, unspecified heart failure type [facility])     Rx / DC Orders ED Discharge Orders    None       Delia Heady, PA-C 04/26/20 [place], MD 04/28/20 2338

## 2020-04-26 NOTE — ED Provider Notes (Signed)
Care received from [facility].  We see her note for full HPI  In short, 74 year old male with prior PE on Eliquis, CHF with an EF of 45 to 50% presents to the ER with complaints of worsening shortness of breath with rest and exertion.  Work-up of by prior provider included basic labs which I personally reviewed as well.  CBC and BMP largely unremarkable other than a slightly increased creatinine from his baseline.  His Covid test is negative.  His BNP is up by several 100 points today.  His chest x-ray showed some cardiomegaly with some interstitial edema with interval progression.  Received signout pending CTA to reevaluate PEs.  There was a plan to diurese the patient given appearance of heart failure exacerbation, however the patient's blood pressures became soft with systolic in the upper to mid 90s.  Diuresis was put on hold.  I personally reviewed his CTA which showed a right-sided pleural effusion, questionable mucous plugging, infectious or reactive in nature.  It also showed growing bilateral pulmonary nodules which could be infectious inflammatory or malignant.  Patient's BP had improved.  He was given 40 mg of IV Lasix.  Consulted Dr. Tobe Sos with the hospitalist team given worsening shortness of breath in setting of possible heart failure exacerbation and right-sided pleural effusion.  He will admit the patient for further evaluation and treatment.  Remained hemodynamically stable here in the ED  Case discussed with Dr. Billy Fischer who is agreeable to the above plan and disposition.  Physical Exam  BP 119/82    Pulse 65    Temp 97.6 F (36.4 C) (Oral)    Resp 20    Ht 6' (1.829 m)    Wt 66.7 kg    SpO2 98%    BMI 19.94 kg/m   Physical Exam Vitals and nursing note reviewed.  Constitutional:      Appearance: He is well-developed. He is ill-appearing (chronically ill appearing).  HENT:     Head: Normocephalic and atraumatic.  Eyes:     Conjunctiva/sclera: Conjunctivae normal.      Pupils: Pupils are equal, round, and reactive to light.  Cardiovascular:     Rate and Rhythm: Normal rate and regular rhythm.     Heart sounds: No murmur heard.   Pulmonary:     Effort: Pulmonary effort is normal. No respiratory distress.     Breath sounds: Examination of the right-middle field reveals rales. Examination of the left-middle field reveals rales. Examination of the left-lower field reveals rales. Rales present. No decreased breath sounds, wheezing or rhonchi.  Chest:     Chest wall: No deformity or tenderness.  Abdominal:     Palpations: Abdomen is soft.     Tenderness: There is no abdominal tenderness.  Musculoskeletal:     Cervical back: Normal range of motion and neck supple.     Right lower leg: No tenderness. No edema.     Left lower leg: No tenderness. No edema.  Skin:    General: Skin is warm and dry.     Findings: No erythema or rash.  Neurological:     General: No focal deficit present.     Mental Status: He is alert.  Psychiatric:        Mood and Affect: Mood normal.        Behavior: Behavior normal.     ED Course/Procedures   Clinical Course as of Apr 26 2038  Tue Apr 26, 2020  1435 Baseline around 1.1  Creatinine(!): 1.45 [  HK]  1435 Shows interstitial edema.  DG Chest 2 View [HK]    Clinical Course User Index [HK] Delia Heady, PA-C    Procedures  MDM        Lyndel Safe 04/26/20 2039    Gareth Morgan, MD 04/27/20 [phone]

## 2020-04-27 ENCOUNTER — Encounter (HOSPITAL_COMMUNITY): Payer: Self-pay | Admitting: Internal Medicine

## 2020-04-27 ENCOUNTER — Inpatient Hospital Stay (HOSPITAL_COMMUNITY): Payer: No Typology Code available for payment source

## 2020-04-27 DIAGNOSIS — J90 Pleural effusion, not elsewhere classified: Secondary | ICD-10-CM

## 2020-04-27 HISTORY — PX: IR THORACENTESIS ASP PLEURAL SPACE W/IMG GUIDE: IMG5380

## 2020-04-27 LAB — BODY FLUID CELL COUNT WITH DIFFERENTIAL
Lymphs, Fluid: 68 %
Monocyte-Macrophage-Serous Fluid: 20 % — ABNORMAL LOW (ref 50–90)
Neutrophil Count, Fluid: 12 % (ref 0–25)
Total Nucleated Cell Count, Fluid: 101 cu mm (ref 0–1000)

## 2020-04-27 LAB — CBC
HCT: 36.5 % — ABNORMAL LOW (ref 39.0–52.0)
Hemoglobin: 11.2 g/dL — ABNORMAL LOW (ref 13.0–17.0)
MCH: 27 pg (ref 26.0–34.0)
MCHC: 30.7 g/dL (ref 30.0–36.0)
MCV: 88 fL (ref 80.0–100.0)
Platelets: 192 10*3/uL (ref 150–400)
RBC: 4.15 MIL/uL — ABNORMAL LOW (ref 4.22–5.81)
RDW: 18 % — ABNORMAL HIGH (ref 11.5–15.5)
WBC: 3.7 10*3/uL — ABNORMAL LOW (ref 4.0–10.5)
nRBC: 0 % (ref 0.0–0.2)

## 2020-04-27 LAB — COMPREHENSIVE METABOLIC PANEL
ALT: 19 U/L (ref 0–44)
AST: 21 U/L (ref 15–41)
Albumin: 2.7 g/dL — ABNORMAL LOW (ref 3.5–5.0)
Alkaline Phosphatase: 48 U/L (ref 38–126)
Anion gap: 11 (ref 5–15)
BUN: 17 mg/dL (ref 8–23)
CO2: 27 mmol/L (ref 22–32)
Calcium: 8.5 mg/dL — ABNORMAL LOW (ref 8.9–10.3)
Chloride: 102 mmol/L (ref 98–111)
Creatinine, Ser: 1.37 mg/dL — ABNORMAL HIGH (ref 0.61–1.24)
GFR, Estimated: 54 mL/min — ABNORMAL LOW (ref >60)
Glucose, Bld: 85 mg/dL (ref 70–99)
Potassium: 3 mmol/L — ABNORMAL LOW (ref 3.5–5.1)
Sodium: 140 mmol/L (ref 135–145)
Total Bilirubin: 1.1 mg/dL (ref 0.3–1.2)
Total Protein: 6.1 g/dL — ABNORMAL LOW (ref 6.5–8.1)

## 2020-04-27 LAB — PROTEIN, PLEURAL OR PERITONEAL FLUID: Total protein, fluid: <3 g/dL

## 2020-04-27 LAB — PHOSPHORUS: Phosphorus: 4.1 mg/dL (ref 2.5–4.6)

## 2020-04-27 LAB — MAGNESIUM: Magnesium: 1.9 mg/dL (ref 1.7–2.4)

## 2020-04-27 LAB — ALBUMIN, PLEURAL OR PERITONEAL FLUID: Albumin, Fluid: <1 g/dL

## 2020-04-27 LAB — LACTATE DEHYDROGENASE, PLEURAL OR PERITONEAL FLUID: LD, Fluid: 49 U/L — ABNORMAL HIGH (ref 3–23)

## 2020-04-27 LAB — APTT: aPTT: 33 seconds (ref 24–36)

## 2020-04-27 LAB — LACTATE DEHYDROGENASE: LDH: 166 U/L (ref 98–192)

## 2020-04-27 LAB — GRAM STAIN

## 2020-04-27 LAB — ALBUMIN: Albumin: 3 g/dL — ABNORMAL LOW (ref 3.5–5.0)

## 2020-04-27 LAB — PROTIME-INR
INR: 2.1 — ABNORMAL HIGH (ref 0.8–1.2)
Prothrombin Time: 22.6 seconds — ABNORMAL HIGH (ref 11.4–15.2)

## 2020-04-27 LAB — GLUCOSE, PLEURAL OR PERITONEAL FLUID: Glucose, Fluid: 95 mg/dL

## 2020-04-27 MED ORDER — ASPIRIN EC 81 MG PO TBEC
81.0000 mg | DELAYED_RELEASE_TABLET | Freq: Every day | ORAL | Status: DC
  Administered 2020-04-27 – 2020-05-02 (×6): 81 mg via ORAL
  Filled 2020-04-27 (×6): qty 1

## 2020-04-27 MED ORDER — HYDROXYCHLOROQUINE SULFATE 200 MG PO TABS
400.0000 mg | ORAL_TABLET | Freq: Every day | ORAL | Status: DC
  Administered 2020-04-27 – 2020-05-02 (×6): 400 mg via ORAL
  Filled 2020-04-27 (×7): qty 2

## 2020-04-27 MED ORDER — TAMSULOSIN HCL 0.4 MG PO CAPS
0.4000 mg | ORAL_CAPSULE | Freq: Every evening | ORAL | Status: DC
  Administered 2020-04-28 – 2020-05-01 (×3): 0.4 mg via ORAL
  Filled 2020-04-27 (×3): qty 1

## 2020-04-27 MED ORDER — POTASSIUM CHLORIDE CRYS ER 20 MEQ PO TBCR
40.0000 meq | EXTENDED_RELEASE_TABLET | ORAL | Status: AC
  Administered 2020-04-27 (×2): 40 meq via ORAL
  Filled 2020-04-27 (×2): qty 2

## 2020-04-27 MED ORDER — LIDOCAINE HCL 1 % IJ SOLN
INTRAMUSCULAR | Status: DC | PRN
  Administered 2020-04-27: 10 mL

## 2020-04-27 MED ORDER — FUROSEMIDE 10 MG/ML IJ SOLN
20.0000 mg | Freq: Once | INTRAMUSCULAR | Status: AC
  Administered 2020-04-27: 20 mg via INTRAVENOUS
  Filled 2020-04-27: qty 2

## 2020-04-27 MED ORDER — ENSURE ENLIVE PO LIQD
237.0000 mL | Freq: Three times a day (TID) | ORAL | Status: DC
  Administered 2020-04-27 – 2020-05-02 (×8): 237 mL via ORAL

## 2020-04-27 MED ORDER — HEPARIN (PORCINE) 25000 UT/250ML-% IV SOLN
1000.0000 [IU]/h | INTRAVENOUS | Status: DC
  Administered 2020-04-27 – 2020-04-28 (×2): 950 [IU]/h via INTRAVENOUS
  Filled 2020-04-27 (×2): qty 250

## 2020-04-27 MED ORDER — LIDOCAINE HCL 1 % IJ SOLN
INTRAMUSCULAR | Status: AC
  Filled 2020-04-27: qty 20

## 2020-04-27 MED ORDER — AZATHIOPRINE 50 MG PO TABS
100.0000 mg | ORAL_TABLET | Freq: Two times a day (BID) | ORAL | Status: DC
  Administered 2020-04-27 – 2020-05-02 (×12): 100 mg via ORAL
  Filled 2020-04-27 (×16): qty 2

## 2020-04-27 MED ORDER — OXYCODONE HCL 5 MG PO TABS
5.0000 mg | ORAL_TABLET | Freq: Four times a day (QID) | ORAL | Status: DC | PRN
  Administered 2020-04-27: 5 mg via ORAL
  Filled 2020-04-27: qty 1

## 2020-04-27 NOTE — Progress Notes (Addendum)
IR procedure site lower rt lower lateral back with slight swelling, pt reports tender to the touch. Site with band aide CDI.

## 2020-04-27 NOTE — Progress Notes (Addendum)
This rn arrived to bedside to find pt in no apparent distress. Pt wife at bedside. Thoracentesis site CDI with resolved swelling, pt reports site remains tender to touch. RT to bedside to confirm auscultation of air movement. Dr Doristine Bosworth notified of change and auscultation of cardiac murmur. Will get pcxr per md order.

## 2020-04-27 NOTE — Procedures (Signed)
PROCEDURE SUMMARY:  Successful image-guided right thoracentesis. Yielded 1.1 liters of hazy gold fluid. Patient tolerated procedure well. No immediate complications. EBL = 0 mL.  Specimen was sent for labs. CXR ordered.  Please see imaging section of Epic for full dictation.   Claris Pong Natalea Sutliff PA-C 04/27/2020 9:35 AM

## 2020-04-27 NOTE — Progress Notes (Signed)
PROGRESS NOTE    Cody Arellano  VOH:[id number] DOB: Aug 28, 1945 DOA: 04/26/2020 PCP: Venia Carbon, MD   Brief Narrative:  HPI: Cody Arellano is a 74 y.o. male with medical history significant for lupus, interstitial lung disease, coronary artery disease, chronic systolic CHF, paroxysmal atrial fibrillation on Eliquis, and recent PE who presents to the emergency department due to shortness of breath that has been ongoing for about 2 weeks, this was associated with nausea and intermittent nonbilious, nonbloody vomiting (about 2-3 times per week).  Shortness of breath worsens with exertion and states that he could barely walk 10 feet without being short of breath, he endorsed about 20 pound unintentional weight loss since May 2021.  Patient was recently admitted/4/21 due to acute on chronic systolic CHF.  Denies fever, chills, headache, chest pain or abdominal pain.  ED Course: In the emergency department, patient was tachypneic, otherwise he was hemodynamically stable.  Work-up in the ED showed normocytic anemia, normal BMP except for elevated creatinine at 1.45, baseline creatinine 1.0-1.2) elevated BNP 1824.4 (1677.6-2 weeks ago; 1543.3-1 month ago). CT chest with contrast showed poor opacification of the bilateral lower lobe pulmonary arteries. Differential considerations include underlying pulmonary emboli as before versus suboptimal opacification secondary to contrast bolus timing.  Large right-sided pleural effusion and small left-sided pleural effusion also noted.  Chest x-ray showed cardiomegaly with pulmonary venous congestion and bilateral interstitial prominence most consistent with interstitial edema.  IV Lasix 40 Mg x1 was given.  Hospitalist was asked to admit.  For further evaluation and management.  Assessment & Plan:   Principal Problem:   Pleural effusion on right Active Problems:   CAD (coronary artery disease)   Hyperlipemia   Paroxysmal atrial fibrillation (HCC)   Heart  failure, systolic, chronic (HCC)   Pulmonary embolism (HCC)   Acute on chronic systolic CHF (congestive heart failure) (HCC)   Shortness of breath   Elevated brain natriuretic peptide (BNP) level   AKI (acute kidney injury) ([place])   Pulmonary nodules   Nausea & vomiting   Dyspnea possibly secondary to multifactorial including right pleural effusion, chronic CHF, pulmonary embolism CT angiography of chest showed large right-sided pleural effusion and small left-sided pleural effusion also noted.   Chest x-ray showed cardiomegaly with pulmonary venous congestion and bilateral interstitial prominence most consistent with interstitial edema.  IR consulted.  S/p diagnostic and therapeutic thoracentesis with 1.1 L retrieval. We will follow labs to determine cause.  Patient feels much better after thoracentesis.  He is not requiring any oxygen.  Nausea and vomiting: Resolved. Continue IV Zofran p.r.n.  Pulmonary nodules CT angiography of chest showed growing bilateral pulmonary nodules suspected to be due to infectious or inflammatory in etiology, malignancy is not excluded We will consult PCCM.  Acute kidney injury: Presented with creatinine of 1.45.  At baseline his creatinine is normal.  It is improving.  Unable to provide any IV fluids due to pleural effusion.  It might improve now that he has had thoracentesis.  Avoid nephrotoxic agents.  Pete labs in the morning.  Hyperlipidemia Continue Crestor  Acute on chronic systolic CHF: BNP 6948.5 (1677.6-2 weeks ago; 1543.3-1 month ago) Chest x-ray showed interstitial edema IV Lasix 40 Mg x1 was given in the ED; Lasix temporarily held due to soft BP  Continue total input/output, daily weights and fluid restriction Continue Cardiac diet  Echocardiogram done on 03/23/20 showed LVEF of 45 to 50% with mildly decreased LV function and regional wall motion abnormalities.  RV  systolic function is moderately reduced, RV size is mildly  enlarged.  Pulmonary embolism Patient states that he has been compliant with Eliquis, last dose was this morning (11/23).  Eliquis on hold for thoracentesis which is done today.  He has high risk of PE as he developed PE just by holding his Eliquis only few days during recent hospitalization so I will start him on heparin with pharmacy to consult later today just in case he reaccumulated and requires another thoracentesis.  History of paroxysmal atrial fibrillation Patient currently in normal sinus rhythm CHADS-VASc is 43 (age, CHF, CAD) Eliquis on hold as mentioned above.  Starting heparin.  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 Continue Plaquenil and Imuran  DVT prophylaxis: SCDs Start: 04/26/20 2154   Code Status: Full Code  Family Communication:  None present at bedside.  Plan of care discussed with patient in length and he verbalized understanding and agreed with it.  Status is: Inpatient  Remains inpatient appropriate because:Ongoing diagnostic testing needed not appropriate for outpatient work up   Dispo: The patient is from: Home              Anticipated d/c is to: Home              Anticipated d/c date is: 2 days              Patient currently is not medically stable to d/c.        Estimated body mass index is 19.94 kg/m as calculated from the following:   Height as of this encounter: 6' (1.829 m).   Weight as of this encounter: 66.7 kg.      Nutritional status:               Consultants:   IR and PCCM  Procedures:   Thoracentesis  Antimicrobials:  Anti-infectives (From admission, onward)   Start     Dose/Rate Route Frequency Ordered Stop   04/27/20 2200  hydroxychloroquine (PLAQUENIL) tablet 400 mg        400 mg Oral Daily at bedtime 04/27/20 1051           Subjective: Seen and examined after thoracentesis.  Feels much better.  No complaint other than very minimal pain at the site of  thoracentesis.  Objective: Vitals:   04/26/20 2045 04/26/20 2137 04/27/20 0505 04/27/20 0900  BP: 116/81 102/64 101/71 97/67  Pulse: 66 64 63   Resp: (!) 23 18 18    Temp:  97.8 F (36.6 C) 98.7 F (37.1 C)   TempSrc:  Oral Oral   SpO2: 96% 93% 96%   Weight:      Height:        Intake/Output Summary (Last 24 hours) at 04/27/2020 1157 Last data filed at 04/27/2020 0500 Gross per 24 hour  Intake 240 ml  Output 760 ml  Net -520 ml   Filed Weights   04/26/20 1317  Weight: 66.7 kg    Examination:  General exam: Appears calm and comfortable  Respiratory system: Clear to auscultation. Respiratory effort normal. Cardiovascular system: S1 & S2 heard, RRR. No JVD, murmurs, rubs, gallops or clicks. No pedal edema. Gastrointestinal system: Abdomen is nondistended, soft and nontender. No organomegaly or masses felt. Normal bowel sounds heard. Central nervous system: Alert and oriented. No focal neurological deficits. Extremities: Symmetric 5 x 5 power. Skin: No rashes, lesions or ulcers Psychiatry: Judgement and insight appear normal. Mood & affect appropriate.    Data Reviewed: I have  personally reviewed following labs and imaging studies  CBC: Recent Labs  Lab 04/26/20 1319 04/27/20 0337  WBC 4.1 3.7*  HGB 11.9* 11.2*  HCT 40.1 36.5*  MCV 89.9 88.0  PLT 187 956   Basic Metabolic Panel: Recent Labs  Lab 04/26/20 1319 04/27/20 0337  NA 141 140  K 3.7 3.0*  CL 101 102  CO2 27 27  GLUCOSE 96 85  BUN 18 17  CREATININE 1.45* 1.37*  CALCIUM 9.0 8.5*  MG  --  1.9  PHOS  --  4.1   GFR: Estimated Creatinine Clearance: 44.6 mL/min (A) (by C-G formula based on SCr of 1.37 mg/dL (H)). Liver Function Tests: Recent Labs  Lab 04/27/20 0337  AST 21  ALT 19  ALKPHOS 48  BILITOT 1.1  PROT 6.1*  ALBUMIN 2.7*   No results for input(s): LIPASE, AMYLASE in the last 168 hours. No results for input(s): AMMONIA in the last 168 hours. Coagulation Profile: Recent Labs   Lab 04/27/20 0337  INR 2.1*   Cardiac Enzymes: No results for input(s): CKTOTAL, CKMB, CKMBINDEX, TROPONINI in the last 168 hours. BNP (last 3 results) No results for input(s): PROBNP in the last 8760 hours. HbA1C: No results for input(s): HGBA1C in the last 72 hours. CBG: No results for input(s): GLUCAP in the last 168 hours. Lipid Profile: No results for input(s): CHOL, HDL, LDLCALC, TRIG, CHOLHDL, LDLDIRECT in the last 72 hours. Thyroid Function Tests: No results for input(s): TSH, T4TOTAL, FREET4, T3FREE, THYROIDAB in the last 72 hours. Anemia Panel: No results for input(s): VITAMINB12, FOLATE, FERRITIN, TIBC, IRON, RETICCTPCT in the last 72 hours. Sepsis Labs: No results for input(s): PROCALCITON, LATICACIDVEN in the last 168 hours.  Recent Results (from the past 240 hour(s))  Respiratory Panel by RT PCR (Flu A&B, Covid) - Nasopharyngeal Swab     Status: None   Collection Time: 04/26/20  3:06 PM   Specimen: Nasopharyngeal Swab; Nasopharyngeal(NP) swabs in vial transport medium  Result Value Ref Range Status   SARS Coronavirus 2 by RT PCR NEGATIVE NEGATIVE Final    Comment: (NOTE) SARS-CoV-2 target nucleic acids are NOT DETECTED.  The SARS-CoV-2 RNA is generally detectable in upper respiratoy specimens during the acute phase of infection. The lowest concentration of SARS-CoV-2 viral copies this assay can detect is 131 copies/mL. A negative result does not preclude SARS-Cov-2 infection and should not be used as the sole basis for treatment or other patient management decisions. A negative result may occur with  improper specimen collection/handling, submission of specimen other than nasopharyngeal swab, presence of viral mutation(s) within the areas targeted by this assay, and inadequate number of viral copies (<131 copies/mL). A negative result must be combined with clinical observations, patient history, and epidemiological information. The expected result is  Negative.  Fact Sheet for Patients:  PinkCheek.be  Fact Sheet for Healthcare Providers:  GravelBags.it  This test is no t yet approved or cleared by the [place] FDA and  has been authorized for detection and/or diagnosis of SARS-CoV-2 by FDA under an Emergency Use Authorization (EUA). This EUA will remain  in effect (meaning this test can be used) for the duration of the COVID-19 declaration under Section 564(b)(1) of the Act, 21 U.S.C. section 360bbb-3(b)(1), unless the authorization is terminated or revoked sooner.     Influenza A by PCR NEGATIVE NEGATIVE Final   Influenza B by PCR NEGATIVE NEGATIVE Final    Comment: (NOTE) The Xpert Xpress SARS-CoV-2/FLU/RSV assay is intended as an aid in  the diagnosis of influenza from Nasopharyngeal swab specimens and  should not be used as a sole basis for treatment. Nasal washings and  aspirates are unacceptable for Xpert Xpress SARS-CoV-2/FLU/RSV  testing.  Fact Sheet for Patients: PinkCheek.be  Fact Sheet for Healthcare Providers: GravelBags.it  This test is not yet approved or cleared by the [place] FDA and  has been authorized for detection and/or diagnosis of SARS-CoV-2 by  FDA under an Emergency Use Authorization (EUA). This EUA will remain  in effect (meaning this test can be used) for the duration of the  Covid-19 declaration under Section 564(b)(1) of the Act, 21  U.S.C. section 360bbb-3(b)(1), unless the authorization is  terminated or revoked. Performed at [facility], [place] [address] [place], [place] [address]   Gram stain     Status: None   Collection Time: 04/27/20  9:43 AM   Specimen: Lung, Right; Pleural Fluid  Result Value Ref Range Status   Specimen Description PLEURAL FLUID  Final   Special Requests RIGHT LUNG  Final   Gram Stain   Final    CYTOSPIN SMEAR WBC PRESENT,BOTH PMN  AND MONONUCLEAR NO ORGANISMS SEEN Performed at [facility], [place] [address], [place], [place] [address]    Report Status 04/27/2020 FINAL  Final      Radiology Studies: DG Chest 1 View  Result Date: 04/27/2020 CLINICAL DATA:  Post right thoracentesis. EXAM: CHEST  1 VIEW COMPARISON:  April 26, 2020. FINDINGS: Decreased right pleural effusion without visible pneumothorax status post thoracentesis. The right costophrenic sulcus is incompletely visualized. Bibasilar reticular opacities, which likely represents bronchial wall thickening and mucous plugging better seen on recent CT chest. No new confluent consolidation. Please see recent CT chest for characterization of pulmonary nodules. Similar cardiomediastinal silhouette. No acute osseous abnormality. IMPRESSION: 1. Decreased right pleural effusion without visible pneumothorax status post thoracentesis. 2. Bibasilar reticular opacities, which likely represents bronchial wall thickening and mucous plugging better seen on recent CT chest. 3. Please see recent CT chest for characterization of pulmonary nodules. Electronically Signed   By: Margaretha Sheffield MD   On: 04/27/2020 09:44   DG Chest 2 View  Result Date: 04/26/2020 CLINICAL DATA:  Shortness of breath.  Chest pain. EXAM: CHEST - 2 VIEW COMPARISON:  04/06/2020. FINDINGS: Mediastinum hilar structures stable. Surgical clips and left atrial appendage clip noted over the stable cardiomegaly scratched it cardiomegaly with pulmonary venous congestion again noted. Bilateral interstitial prominence most consistent interstitial edema again noted. Interim progression from prior exam. Small right pleural effusion again noted. No pneumothorax. Thoracic spine scoliosis. IMPRESSION: Cardiomegaly with pulmonary venous congestion and bilateral interstitial prominence most consistent with interstitial edema. Interim progression from prior exam. Small right pleural effusion again noted. Electronically  Signed   By: Marcello Moores  Register   On: 04/26/2020 13:46   CT Angio Chest PE W/Cm &/Or Wo Cm  Result Date: 04/26/2020 CLINICAL DATA:  Shortness of breath. History of congestive heart failure and PE. EXAM: CT ANGIOGRAPHY CHEST WITH CONTRAST TECHNIQUE: Multidetector CT imaging of the chest was performed using the standard protocol during bolus administration of intravenous contrast. Multiplanar CT image reconstructions and MIPs were obtained to evaluate the vascular anatomy. CONTRAST:  83mL OMNIPAQUE IOHEXOL 350 MG/ML SOLN COMPARISON:  03/23/2020 FINDINGS: Cardiovascular: Contrast injection is sufficient to demonstrate satisfactory opacification of the pulmonary arteries to the segmental level. There is suboptimal opacification of the lobar, segmental, and subsegmental pulmonary artery branches of the right lower lobe. There is suboptimal  opacification of the subsegmental branches of the left lower lobe. The size of the main pulmonary artery is enlarged, measuring 3.8 cm. Moderate cardiomegaly. There is reflux of contrast into the IVC. There are atherosclerotic changes of the thoracic aorta without evidence for an aneurysm. Coronary artery calcifications are noted. Mediastinum/Nodes: -- No mediastinal lymphadenopathy. -- No hilar lymphadenopathy. -- No axillary lymphadenopathy. -- No supraclavicular lymphadenopathy. -- Normal thyroid gland where visualized. -  Unremarkable esophagus. Lungs/Pleura: There is a large right-sided pleural effusion. Emphysematous changes are noted bilaterally. There is a 1.1 cm pulmonary nodule in the left upper lobe which has increased in size from the prior study (axial series 6, image 27). There is a ground-glass airspace opacity in the left upper lobe measuring approximately 2.1 cm which has increased in size from the prior study when it measured approximately 1.3 cm (axial series 6, image 55) parent there is diffuse bronchial wall thickening and mucus plugging bilaterally. There is  atelectasis versus consolidation in the right lower lobe. There is a 9 mm pulmonary nodule in the right middle lobe, increased in size from prior study (axial series 6, image 86). There is a small left-sided pleural effusion. Upper Abdomen: Contrast bolus timing is not optimized for evaluation of the abdominal organs. There is a small volume of free fluid in the upper abdomen. There are few scattered colonic diverticula. Musculoskeletal: No chest wall abnormality. No bony spinal canal stenosis. Review of the MIP images confirms the above findings. IMPRESSION: 1. Again noted is poor opacification of the bilateral lower lobe pulmonary arteries. Differential considerations include underlying pulmonary emboli as before versus suboptimal opacification secondary to contrast bolus timing. 2. Large right-sided pleural effusion. There is a small left-sided pleural effusion. 3. Cardiomegaly with reflux of contrast in the IVC consistent with underlying cardiac dysfunction. 4. Bilateral bronchial wall thickening and mucus plugging consistent with infectious or reactive bronchiolitis. 5. Growing bilateral pulmonary nodules. While these may be infectious or inflammatory in etiology, malignancy is not excluded. Pulmonary medicine consultation is recommended. 6. Small volume ascites in the upper abdomen. Aortic Atherosclerosis (ICD10-I70.0) and Emphysema (ICD10-J43.9). Electronically Signed   By: Constance Holster M.D.   On: 04/26/2020 19:13    Scheduled Meds: . aspirin EC  81 mg Oral Daily  . azaTHIOprine  100 mg Oral BID  . hydroxychloroquine  400 mg Oral QHS  . lidocaine      . potassium chloride  40 mEq Oral Q4H  . rosuvastatin  20 mg Oral Daily  . tamsulosin  0.4 mg Oral QPM   Continuous Infusions:   LOS: 1 day   Time spent: 35 minutes   Darliss Cheney, MD [facility]  04/27/2020, 11:57 AM   To contact the attending provider between 7A-7P or the covering provider during after hours 7P-7A, please  log into the web site [email or web address].

## 2020-04-27 NOTE — Progress Notes (Signed)
Pt reports increased sob. Pt speaking full sentences appears weak/fatigue in no apparent distress. RT paged for bedside eval

## 2020-04-27 NOTE — Progress Notes (Signed)
Dr Doristine Bosworth states ok to start heparin infusion. thoracentesis site unremarkable and drsg CDI. Pt reports feeling less sob. Heparin drip started per orders.

## 2020-04-27 NOTE — Consult Note (Signed)
NAME:  Cody Arellano, MRN:  [id number], DOB:  1945/10/11, LOS: 1 ADMISSION DATE:  04/26/2020, CONSULTATION DATE:  04/27/20 REFERRING MD:  Darliss Cheney, MD, CHIEF COMPLAINT:  DOE   Brief History   74 year old with ischemic cardiomyopathy EF 45%, SLE on imuran and plaquenil whom we are consulted for evaluation of pulmonary nodules.   History of present illness   Notes DOE, SOB at rest since heart surgery many months ago. Breathing worsens at times. Admitted for volume overload in last couple of months. Felt similar. Came to ED. BNP at highest level. CT with bilateral R>L pleural effusions as well as nodules discussed below. Admitted. Received IV lasix. Had thora this morning. Initial studies consistent with transudate, lymph predominant. Feels breathing is improved post procedure.  Notes 20 or so pound weight loss over several months. Has had no appetite in months. No fever or chills. No significant cough. No night sweats.   Diagnosed with lupus in 2014 in [place]. Was on prednisone in past. Now on plaquenil and azathioprine. Has rheumatologist that prescribes these meds. Said predominantly affected lungs. Cardiology notes reviewed and indicate had pericarditis as well. Has ~20 pack year smoking history. TTE 06/2019 reviewed with LA enlargement, EF 45%,  Moderate mitral regurgitation, mild AI.  Past Medical History  CHF w/ reduced EF, mitral regurg, CAD, Afib, SLE  Significant Hospital Events   Admitted 11/23 thora 11/23  Consults:  PCCM VIR  Procedures:  Inocencio Homes 11/24  Significant Diagnostic Tests:  CT chest 11/23 - R>L bilateral pleural effusions, stable nodules largest 2 in LUL, pulmonary edema, bullous emphysematous changes scattered throughout  Pleural fluid transudate 11/24 Micro Data:  Pleural fluid culture pending  Antimicrobials:  n/a  Interim history/subjective:  n/a  Objective   Blood pressure 104/70, pulse 65, temperature 97.7 F (36.5 C), temperature source Oral,  resp. rate 16, height 6' (1.829 m), weight 66.7 kg, SpO2 100 %.        Intake/Output Summary (Last 24 hours) at 04/27/2020 1452 Last data filed at 04/27/2020 0500 Gross per 24 hour  Intake 240 ml  Output 760 ml  Net -520 ml   Filed Weights   04/26/20 1317  Weight: 66.7 kg    Examination: General: thin, in NAD Eyes: EOMI, no icterus Neck: No JVP appreciated, supple Lungs: inspiratory crackles in bases, NWOB on 2L [place] Cardiovascular: RRR, late systolic murmur 3/6 Abdomen: non-distended, BS present Extremities: thin, no edema Neuro: no weakness, sensation intact Psych: Normal mood, flat affect  Resolved Hospital Problem list   n/a  Assessment & Plan:  Pulmonary nodules largely unchanged in immunocompromised host: Nodules overall similar in size on serial comparisons since 10/2019 with most apical LUL nodule more dense over time and more inferior LUL nodule less dense over time. RLL nodule largely unchanged. No mediastinal LAD. Less likely to be malignant gvien image stability, multiple nodules, no LAD. Possibly related to lupus or atypical infection given immunocompromised state. -Plan bronch with BAL to target inferior LUL nodule Friday 11/26, NPO at midnight the night prior -Hold apixaban until after procedure, ok for heparin drip, would d/c at 6am 11/26 -C3, C4, anti-DS DNA ordered to assess for lupus flare, ANCA ordered as well  Pleural effusions: Transudate, Related to volume overload due to reduced EF, valvular insufficiency, hypoalbuminemia. --Diurese as tolerated  Acute Hypoxemic Respiratory Failure: Pulmonary edema and shunt through atelecatsis from pleura effusion. -Diurese as tolerated  Best practice (evaluated daily)   Per primary  Labs  CBC: Recent Labs  Lab 04/26/20 1319 04/27/20 0337  WBC 4.1 3.7*  HGB 11.9* 11.2*  HCT 40.1 36.5*  MCV 89.9 88.0  PLT 187 976    Basic Metabolic Panel: Recent Labs  Lab 04/26/20 1319 04/27/20 0337  NA 141 140  K  3.7 3.0*  CL 101 102  CO2 27 27  GLUCOSE 96 85  BUN 18 17  CREATININE 1.45* 1.37*  CALCIUM 9.0 8.5*  MG  --  1.9  PHOS  --  4.1   GFR: Estimated Creatinine Clearance: 44.6 mL/min (A) (by C-G formula based on SCr of 1.37 mg/dL (H)). Recent Labs  Lab 04/26/20 1319 04/27/20 0337  WBC 4.1 3.7*    Liver Function Tests: Recent Labs  Lab 04/27/20 0337 04/27/20 1125  AST 21  --   ALT 19  --   ALKPHOS 48  --   BILITOT 1.1  --   PROT 6.1*  --   ALBUMIN 2.7* 3.0*   No results for input(s): LIPASE, AMYLASE in the last 168 hours. No results for input(s): AMMONIA in the last 168 hours.  ABG    Component Value Date/Time   PHART 7.371 10/18/2019 0224   PCO2ART 35.4 10/18/2019 0224   PO2ART 113 (H) 10/18/2019 0224   HCO3 20.5 10/18/2019 0224   TCO2 23 11/12/2019 1730   ACIDBASEDEF 4.0 (H) 10/18/2019 0224   O2SAT 59.4 10/26/2019 0700     Coagulation Profile: Recent Labs  Lab 04/27/20 0337  INR 2.1*    Cardiac Enzymes: No results for input(s): CKTOTAL, CKMB, CKMBINDEX, TROPONINI in the last 168 hours.  HbA1C: Hgb A1c MFr Bld  Date/Time Value Ref Range Status  10/17/2019 12:05 PM 5.7 (H) 4.8 - 5.6 % Final    Comment:    (NOTE) Pre diabetes:          5.7%-6.4% Diabetes:              >6.4% Glycemic control for   <7.0% adults with diabetes     CBG: No results for input(s): GLUCAP in the last 168 hours.  Review of Systems:   No neurologic changes. No headache. No night sweats. Comprehensive review of systems otherwise negative.   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   Surgical History    Past  Surgical History:  Procedure Laterality Date  . CARDIAC CATHETERIZATION    . CLIPPING OF ATRIAL APPENDAGE N/A 10/17/2019   Procedure: Clipping Of Atrial Appendage using AtriCure [id number] 45 MM AtriClip.;  Surgeon: Rexene Alberts, MD;  Location: [place];  Service: Open Heart Surgery;  Laterality: N/A;  . CORONARY ARTERY BYPASS GRAFT N/A 10/17/2019   Procedure: CORONARY ARTERY BYPASS GRAFTING (CABG) using LIMA to LAD; Endoscopic harvest right greater saphenous vein: SVG to RAMUS.;  Surgeon: Rexene Alberts, MD;  Location: [facility];  Service: Open Heart Surgery;  Laterality: N/A;  . CORONARY BALLOON ANGIOPLASTY N/A 10/17/2019   Procedure: CORONARY BALLOON ANGIOPLASTY;  Surgeon: Nelva Bush, MD;  Location: [facility];  Service: Cardiovascular;  Laterality: N/A;  . coronary stents  2009  . CORONARY/GRAFT ACUTE MI REVASCULARIZATION N/A 10/17/2019   Procedure: Coronary/Graft Acute MI Revascularization;  Surgeon: Nelva Bush, MD;  Location:  [place];  Service: Cardiovascular;  Laterality: N/A;  . ENDOVEIN HARVEST OF GREATER SAPHENOUS VEIN Right 10/17/2019   Procedure: [place] Of Greater Saphenous Vein;  Surgeon: Rexene Alberts, MD;  Location: [place] [facility];  Service: Open Heart Surgery;  Laterality: Right;  . IABP INSERTION N/A 10/17/2019   Procedure: IABP Insertion;  Surgeon: Nelva Bush, MD;  Location: [facility];  Service: Cardiovascular;  Laterality: N/A;  . INCISION AND DRAINAGE ABSCESS N/A 01/29/2020   Procedure: INCISION AND DRAINAGE BILATERAL PERIRECTAL ABSCESS;  Surgeon: Stark Klein, MD;  Location: [facility];  Service: General;  Laterality: N/A;  . IR THORACENTESIS ASP PLEURAL SPACE W/IMG GUIDE  10/26/2019  . IR THORACENTESIS ASP PLEURAL SPACE W/IMG GUIDE  04/27/2020  . RIGHT/LEFT HEART CATH AND CORONARY ANGIOGRAPHY N/A 10/17/2019   Procedure: RIGHT/LEFT HEART CATH AND CORONARY ANGIOGRAPHY;  Surgeon: Nelva Bush, MD;  Location: [facility];  Service:  Cardiovascular;  Laterality: N/A;  . TEE WITHOUT CARDIOVERSION  10/17/2019   Procedure: Transesophageal Echocardiogram (Tee);  Surgeon: Rexene Alberts, MD;  Location: [facility] OR;  Service: Open Heart Surgery;;     Social History   reports that he quit smoking about 27 years ago. His smoking use included cigarettes. He has a 20.00 pack-year smoking history. He has never used smokeless tobacco. He reports that he does not drink alcohol and does not use drugs.   Family History   His family history includes Alcohol abuse in his father; Cancer in his brother; Diabetes in his paternal uncle; Heart disease in his mother; Hyperlipidemia in his brother and sister; Hypertension in his brother and sister; Stomach cancer in his brother. There is no history of Colon cancer, Esophageal cancer, or Rectal cancer.   Allergies No Known Allergies   Home Medications  Prior to Admission medications   Medication Sig Start Date End Date Taking? Authorizing Provider  apixaban (ELIQUIS) 5 MG TABS tablet Take 1 tablet (5 mg total) by mouth 2 (two) times daily. 04/01/20 05/01/20 Yes Pahwani, Einar Grad, MD  aspirin EC 81 MG tablet Take 1 tablet (81 mg total) by mouth daily. 08/10/14  Yes Nahser, Wonda Cheng, MD  azaTHIOprine (IMURAN) 50 MG tablet Take 100 mg by mouth 2 (two) times daily.  10/11/14  Yes [provider]  furosemide (LASIX) 40 MG tablet Please take 40 mg oral daily, and take extra 40 mg of Lasix if you gain 3 pounds have worsening lower extremity edema for 1 day. 04/07/20  Yes Elgergawy, Silver Huguenin, MD  hydroxychloroquine (PLAQUENIL) 200 MG tablet Take 400 mg by mouth at bedtime.    Yes [provider]  oxyCODONE (OXY IR/ROXICODONE) 5 MG immediate release tablet Take 1 tablet (5 mg total) by mouth every 6 (six) hours as needed for severe pain. 02/02/20  Yes Rai, Ripudeep K, MD  polyethylene glycol (MIRALAX / GLYCOLAX) 17 g packet Take 17 g by mouth daily as needed for moderate constipation.    Yes [provider]  polyvinyl alcohol (ARTIFICIAL TEARS) 1.4 % ophthalmic solution Place 2 drops into both eyes daily.    Yes [provider]  rosuvastatin (CRESTOR) 20 MG tablet Take 20 mg by mouth daily.   Yes [provider]  tamsulosin (FLOMAX) 0.4 MG CAPS capsule Take 0.4 mg by mouth every evening.  04/02/17  Yes [provider]     Critical care time: n/a     I spent >65 minutes over which 50% was dedicated to face  to face patient care.

## 2020-04-27 NOTE — Progress Notes (Signed)
Pt with n/v scant amount yellow/orange undigested particles (orange sherbert eaten for lunch). Will give zofran ivp

## 2020-04-27 NOTE — Progress Notes (Signed)
Initial Nutrition Assessment  DOCUMENTATION CODES:   Not applicable  INTERVENTION:  Provide Ensure Enlive po TID, each supplement provides 350 kcal and 20 grams of protein.  Encourage adequate PO intake.   NUTRITION DIAGNOSIS:   Increased nutrient needs related to chronic illness (CHF) as evidenced by estimated needs.  GOAL:   Patient will meet greater than or equal to 90% of their needs  MONITOR:   PO intake, Supplement acceptance, Skin, Weight trends, Labs, I & O's  REASON FOR ASSESSMENT:   Malnutrition Screening Tool    ASSESSMENT:   74 y.o. male with medical history significant for lupus, interstitial lung disease, coronary artery disease, chronic systolic CHF, paroxysmal atrial fibrillation presents with shortness of breath. Pt found to have large right-sided pleural effusion and small left-sided pleural effusion. Chest x-ray showed cardiomegaly with pulmonary venous congestion and bilateral interstitial prominence most consistent with interstitial edema.  Pt unavailable during attempted time of contact. RD unable to obtain pt nutrition history at this time. Pt underwent thoracentesis today with 1.1 L yield. Per MD, pt reports unintentional weight loss. Per weight records, pt with a 15% weight loss in 6 months, which is significant for time frame however weight has been fluctuating which may be related to fluid status. RD to order nutritional supplements to aid in caloric and protein needs. Unable to complete Nutrition-Focused physical exam at this time.   Labs and medications reviewed.   Diet Order:   Diet Order            Diet Heart Room service appropriate? Yes; Fluid consistency: Thin  Diet effective now                 EDUCATION NEEDS:   Not appropriate for education at this time  Skin:  Skin Assessment: Reviewed RN Assessment  Last BM:  11/22  Height:   Ht Readings from Last 1 Encounters:  04/26/20 6' (1.829 m)    Weight:   Wt Readings from  Last 1 Encounters:  04/26/20 66.7 kg    BMI:  Body mass index is 19.94 kg/m.  Estimated Nutritional Needs:   Kcal:  2000-2200  Protein:  100-115 grams  Fluid:  2 L/day  Corrin Parker, MS, RD, LDN RD pager number/after hours weekend pager number on Amion.

## 2020-04-27 NOTE — Progress Notes (Signed)
ANTICOAGULATION CONSULT NOTE - Initial Consult  Pharmacy Consult for Heparin Indication: atrial fibrillation and pulmonary embolus  No Known Allergies  Patient Measurements: Height: 6' (182.9 cm) Weight: 66.7 kg (147 lb) IBW/kg (Calculated) : 77.6 Heparin Dosing Weight: 66.7 kg  Vital Signs: Temp: 97.7 F (36.5 C) (11/24 1206) Temp Source: Oral (11/24 1206) BP: 104/70 (11/24 1206) Pulse Rate: 65 (11/24 1206)  Labs: Recent Labs    04/26/20 1319 04/27/20 0337  HGB 11.9* 11.2*  HCT 40.1 36.5*  PLT 187 192  APTT  --  33  LABPROT  --  22.6*  INR  --  2.1*  CREATININE 1.45* 1.37*    Estimated Creatinine Clearance: 44.6 mL/min (A) (by C-G formula based on SCr of 1.37 mg/dL (H)).   Medical History: Past Medical History:  Diagnosis Date  . Anxiety   . CHF (congestive heart failure) ([place])   . Chronic tension headache   . Colon polyps   . Coronary artery disease 2008/2009   MI with PCI x 2, then PCI x 1 in 2009  . Depression   . Dyslipidemia   . Dysrhythmia    atrial fibrillation  . Hyperlipidemia   . Hypertension   . Lupus [facility])    sees Dr Trudie Reed  . Lupus disease of the lung   . 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 Myocardial infarction ([place]) 2008  . 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 Shortness of breath    Assessment:  74 yr old male admitted 11/23 pm with SOB and R pleural effusion.  On Eliquis 5 mg BID prior to admission for hx recent PE (03/23/20) and atrial fibrillation.  Eliquis held on admit and now s/p thoracentesis this morning.  To begin IV heparin without bolus at 6pm tonight.  Last Eliquis dose 11/23 am.     Will monitor heparin with aPTTs while heparin levels are expected to be falsely elevated due to recent Eliquis doses.  Goal of Therapy:  Heparin level 0.3-0.7 units/ml aPTT 66-102 seconds Monitor platelets by anticoagulation protocol: Yes   Plan:   Begin heparin drip without bolus at  6pm tonight, at 950 units/hr.  Heparin level and aPTT ~8 hrs after drip begins.  Daily heparin level and aPTT until correlating. Daily CBC.   Eliquis on hold.  Arty Baumgartner, [place] Phone: [phone] 04/27/2020,12:44 PM

## 2020-04-28 ENCOUNTER — Other Ambulatory Visit (HOSPITAL_COMMUNITY): Payer: No Typology Code available for payment source

## 2020-04-28 ENCOUNTER — Inpatient Hospital Stay (HOSPITAL_COMMUNITY): Payer: No Typology Code available for payment source

## 2020-04-28 DIAGNOSIS — I5023 Acute on chronic systolic (congestive) heart failure: Secondary | ICD-10-CM

## 2020-04-28 DIAGNOSIS — J90 Pleural effusion, not elsewhere classified: Secondary | ICD-10-CM | POA: Diagnosis not present

## 2020-04-28 DIAGNOSIS — R0600 Dyspnea, unspecified: Secondary | ICD-10-CM

## 2020-04-28 LAB — APTT
aPTT: 71 seconds — ABNORMAL HIGH (ref 24–36)
aPTT: 73 seconds — ABNORMAL HIGH (ref 24–36)

## 2020-04-28 LAB — CBC
HCT: 39.7 % (ref 39.0–52.0)
Hemoglobin: 12.1 g/dL — ABNORMAL LOW (ref 13.0–17.0)
MCH: 26.7 pg (ref 26.0–34.0)
MCHC: 30.5 g/dL (ref 30.0–36.0)
MCV: 87.6 fL (ref 80.0–100.0)
Platelets: 170 10*3/uL (ref 150–400)
RBC: 4.53 MIL/uL (ref 4.22–5.81)
RDW: 18 % — ABNORMAL HIGH (ref 11.5–15.5)
WBC: 3.8 10*3/uL — ABNORMAL LOW (ref 4.0–10.5)
nRBC: 0 % (ref 0.0–0.2)

## 2020-04-28 LAB — ECHOCARDIOGRAM LIMITED
Area-P 1/2: 5.6 cm2
Calc EF: 38.5 %
Height: 72 in
MV M vel: 3.86 m/s
MV Peak grad: 59.6 mmHg
Radius: 0.8 cm
S' Lateral: 4.4 cm
Single Plane A2C EF: 39.6 %
Single Plane A4C EF: 40.9 %
Weight: 2204.8 oz

## 2020-04-28 LAB — MAGNESIUM: Magnesium: 2.2 mg/dL (ref 1.7–2.4)

## 2020-04-28 LAB — ACID FAST SMEAR (AFB, MYCOBACTERIA): Acid Fast Smear: NEGATIVE

## 2020-04-28 LAB — C3 COMPLEMENT: C3 Complement: 85 mg/dL (ref 82–167)

## 2020-04-28 LAB — BASIC METABOLIC PANEL
Anion gap: 12 (ref 5–15)
BUN: 21 mg/dL (ref 8–23)
CO2: 25 mmol/L (ref 22–32)
Calcium: 8.6 mg/dL — ABNORMAL LOW (ref 8.9–10.3)
Chloride: 102 mmol/L (ref 98–111)
Creatinine, Ser: 1.6 mg/dL — ABNORMAL HIGH (ref 0.61–1.24)
GFR, Estimated: 45 mL/min — ABNORMAL LOW (ref >60)
Glucose, Bld: 130 mg/dL — ABNORMAL HIGH (ref 70–99)
Potassium: 4 mmol/L (ref 3.5–5.1)
Sodium: 139 mmol/L (ref 135–145)

## 2020-04-28 LAB — HEPARIN LEVEL (UNFRACTIONATED): Heparin Unfractionated: 2.2 IU/mL — ABNORMAL HIGH (ref 0.30–0.70)

## 2020-04-28 LAB — C4 COMPLEMENT: Complement C4, Body Fluid: 15 mg/dL (ref 12–38)

## 2020-04-28 LAB — ANTI-DNA ANTIBODY, DOUBLE-STRANDED: ds DNA Ab: 1 IU/mL (ref 0–9)

## 2020-04-28 MED ORDER — FUROSEMIDE 10 MG/ML IJ SOLN
40.0000 mg | Freq: Once | INTRAMUSCULAR | Status: AC
  Administered 2020-04-28: 40 mg via INTRAVENOUS
  Filled 2020-04-28: qty 4

## 2020-04-28 MED ORDER — PERFLUTREN LIPID MICROSPHERE
1.0000 mL | INTRAVENOUS | Status: AC | PRN
  Administered 2020-04-28: 2 mL via INTRAVENOUS
  Filled 2020-04-28: qty 10

## 2020-04-28 NOTE — Progress Notes (Signed)
[facility] for Heparin Indication: atrial fibrillation and pulmonary embolus  No Known Allergies  Patient Measurements: Height: 6' (182.9 cm) Weight: 66.7 kg (147 lb) IBW/kg (Calculated) : 77.6 Heparin Dosing Weight: 66.7 kg  Vital Signs: Temp: 98 F (36.7 C) (11/24 2151) Temp Source: Oral (11/24 2151) BP: 108/82 (11/24 2151) Pulse Rate: 66 (11/24 2151)  Labs: Recent Labs    04/26/20 1319 04/26/20 1319 04/27/20 0337 04/28/20 0300  HGB 11.9*   < > 11.2* 12.1*  HCT 40.1  --  36.5* 39.7  PLT 187  --  192 170  APTT  --   --  33 71*  LABPROT  --   --  22.6*  --   INR  --   --  2.1*  --   HEPARINUNFRC  --   --   --  2.20*  CREATININE 1.45*  --  1.37*  --    < > = values in this interval not displayed.    Estimated Creatinine Clearance: 44.6 mL/min (A) (by C-G formula based on SCr of 1.37 mg/dL (H)).  Assessment:  74 yr old male admitted 11/23 pm with SOB and R pleural effusion.  On Eliquis 5 mg BID prior to admission for hx recent PE (03/23/20) and atrial fibrillation.  Eliquis held on admit and now s/p thoracentesis this morning.  To begin IV heparin without bolus at 6pm tonight.  Last Eliquis dose 11/23 am.  APTT 71 sec, heparin level falsely elevated as expected  Goal of Therapy:  Heparin level 0.3-0.7 units/ml aPTT 66-102 seconds Monitor platelets by anticoagulation protocol: Yes   Plan:   Continue heparin at 950 units/hr.  Check aPTT in 6-8 hours to confirm  Daily heparin level and aPTT until correlating. Daily CBC.  . Thanks for allowing pharmacy to be a part of this patient's care.  Excell Seltzer, PharmD Clinical Pharmacist 04/28/2020,5:15 AM

## 2020-04-28 NOTE — Progress Notes (Signed)
ANTICOAGULATION CONSULT NOTE   Pharmacy Consult for Heparin Indication: atrial fibrillation and pulmonary embolus  No Known Allergies  Patient Measurements: Height: 6' (182.9 cm) Weight: 62.5 kg (137 lb 12.8 oz) IBW/kg (Calculated) : 77.6 Heparin Dosing Weight: 66.7 kg  Vital Signs: Temp: 98.5 F (36.9 C) (11/25 0616) Temp Source: Oral (11/25 0616) BP: 105/75 (11/25 1154) Pulse Rate: 66 (11/25 1154)  Labs: Recent Labs    04/26/20 1319 04/26/20 1319 04/27/20 0337 04/28/20 0300 04/28/20 1200  HGB 11.9*   < > 11.2* 12.1*  --   HCT 40.1  --  36.5* 39.7  --   PLT 187  --  192 170  --   APTT  --   --  33 71* 73*  LABPROT  --   --  22.6*  --   --   INR  --   --  2.1*  --   --   HEPARINUNFRC  --   --   --  2.20*  --   CREATININE 1.45*  --  1.37*  --   --    < > = values in this interval not displayed.    Estimated Creatinine Clearance: 41.8 mL/min (A) (by C-G formula based on SCr of 1.37 mg/dL (H)).  Assessment:  74 yr old male admitted 11/23 pm with SOB and R pleural effusion.  On Eliquis 5 mg BID prior to admission for hx recent PE (03/23/20) and atrial fibrillation.  Eliquis held on admit and now s/p thoracentesis this morning.  Last Eliquis dose 11/23 am. Began IV heparin without bolus 11/24 PM.  APTT again within goal at 73 sec, heparin level this AM falsely elevated as expected given timing of Eliquis use. Will continue current regimen and recheck APTT and HL tomorrow morning.   Goal of Therapy:  Heparin level 0.3-0.7 units/ml aPTT 66-102 seconds Monitor platelets by anticoagulation protocol: Yes   Plan:  Continue heparin at 950 units/hr. Daily heparin level and aPTT until correlating.  Daily CBC.  Monitor for s/sx of bleeding   Thanks for allowing pharmacy to be a part of this patient's care.  Claudina Lick, PharmD PGY1 Acute Care Pharmacy Resident 04/28/2020 12:48 PM  Please check [email or web address] for unit-specific pharmacy phone numbers.

## 2020-04-28 NOTE — Consult Note (Signed)
Cardiology Consultation:   Patient ID: Cody Arellano MRN: [id number]; DOB: Jan 31, 1946  Admit date: 04/26/2020 Date of Consult: 04/28/2020  Primary Care Provider: Venia Carbon, MD [facility] Cardiologist: Mertie Moores, MD  [facility] Electrophysiologist:  None    Patient Profile:   Cody Arellano is a 74 y.o. male with a hx of ischemic cardiomyopathy recent CABG in May with lupus and pleural effusion who is being seen today for the evaluation of acute systolic heart failure at the request of Dr. Eliseo Squires.  History of Present Illness:   Cody Arellano is a 74 year old male with recent CABG in May, ischemic cardiomyopathy EF 45%, with lupus on both Imuran as well as Plaquenil with bilateral right greater than left pleural effusions and pulmonary nodules.  Recent weight loss of approximately 20 pounds with no appetite.  Also had paroxysmal atrial fibrillation on Eliquis as outpatient with recent PE.  In review of critical care's note, pleural effusions are transudate of and likely related to volume overload due to reduced ejection fraction valvular issues as well as hypoalbuminemia.  Also acute hypoxemic respiratory failure is thought to be secondary to shunt from atelectasis as well as pleural effusion.  Continuing diuresis as tolerated.  Patient states he could barely walk several feet without feeling short of breath.  Currently in bed laying on his side, fairly comfortable, his son is in room.  No chest pain, no syncope, no fevers, no hemoptysis, no melena.  He is just frustrated that the fluid continues to accumulate in his lungs.   Past Medical History:  Diagnosis Date  . Anxiety   . CHF (congestive heart failure) ([place])   . Chronic tension headache   . Colon polyps   . Coronary artery disease 2008/2009   MI with PCI x 2, then PCI x 1 in 2009  . Depression   . Dyslipidemia   . Dysrhythmia    atrial fibrillation  . Hyperlipidemia   . Hypertension   . Lupus  [name])    sees  Dr Trudie Reed  . Lupus disease of the lung   . 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 Myocardial infarction ([place]) 2008  . 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 Shortness of breath     Past Surgical History:  Procedure Laterality Date  . CARDIAC CATHETERIZATION    . CLIPPING OF ATRIAL APPENDAGE N/A 10/17/2019   Procedure: Clipping Of Atrial Appendage using AtriCure [id number] 45 MM AtriClip.;  Surgeon: Rexene Alberts, MD;  Location: [place];  Service: Open Heart Surgery;  Laterality: N/A;  . CORONARY ARTERY BYPASS GRAFT N/A 10/17/2019   Procedure: CORONARY ARTERY BYPASS GRAFTING (CABG) using LIMA to LAD; Endoscopic harvest right greater saphenous vein: SVG to RAMUS.;  Surgeon: Rexene Alberts, MD;  Location: [place];  Service: Open Heart Surgery;  Laterality: N/A;  . CORONARY BALLOON ANGIOPLASTY N/A 10/17/2019   Procedure: CORONARY BALLOON ANGIOPLASTY;  Surgeon: Nelva Bush, MD;  Location: [place] CV LAB;  Service: Cardiovascular;  Laterality: N/A;  . coronary stents  2009  . CORONARY/GRAFT ACUTE MI REVASCULARIZATION N/A 10/17/2019   Procedure: Coronary/Graft Acute MI Revascularization;  Surgeon: Nelva Bush, MD;  Location: [place] CV LAB;  Service: Cardiovascular;  Laterality: N/A;  . ENDOVEIN HARVEST OF GREATER SAPHENOUS VEIN Right 10/17/2019   Procedure: [place] Of Greater Saphenous Vein;  Surgeon: Rexene Alberts, MD;  Location: [place];  Service: Open Heart  Surgery;  Laterality: Right;  . IABP INSERTION N/A 10/17/2019   Procedure: IABP Insertion;  Surgeon: Nelva Bush, MD;  Location: [place] CV LAB;  Service: Cardiovascular;  Laterality: N/A;  . INCISION AND DRAINAGE ABSCESS N/A 01/29/2020   Procedure: INCISION AND DRAINAGE BILATERAL PERIRECTAL ABSCESS;  Surgeon: Stark Klein, MD;  Location: [place];  Service: General;  Laterality: N/A;  . IR THORACENTESIS ASP PLEURAL SPACE W/IMG GUIDE  10/26/2019  . IR THORACENTESIS  ASP PLEURAL SPACE W/IMG GUIDE  04/27/2020  . RIGHT/LEFT HEART CATH AND CORONARY ANGIOGRAPHY N/A 10/17/2019   Procedure: RIGHT/LEFT HEART CATH AND CORONARY ANGIOGRAPHY;  Surgeon: Nelva Bush, MD;  Location: [place] CV LAB;  Service: Cardiovascular;  Laterality: N/A;  . TEE WITHOUT CARDIOVERSION  10/17/2019   Procedure: Transesophageal Echocardiogram ([place]);  Surgeon: Rexene Alberts, MD;  Location: [facility] OR;  Service: Open Heart Surgery;;     Home Medications:  Prior to Admission medications   Medication Sig Start Date End Date Taking? Authorizing Provider  apixaban (ELIQUIS) 5 MG TABS tablet Take 1 tablet (5 mg total) by mouth 2 (two) times daily. 04/01/20 05/01/20 Yes Pahwani, Einar Grad, MD  aspirin EC 81 MG tablet Take 1 tablet (81 mg total) by mouth daily. 08/10/14  Yes Nahser, Wonda Cheng, MD  azaTHIOprine (IMURAN) 50 MG tablet Take 100 mg by mouth 2 (two) times daily.  10/11/14  Yes [provider]  furosemide (LASIX) 40 MG tablet Please take 40 mg oral daily, and take extra 40 mg of Lasix if you gain 3 pounds have worsening lower extremity edema for 1 day. 04/07/20  Yes Elgergawy, Silver Huguenin, MD  hydroxychloroquine (PLAQUENIL) 200 MG tablet Take 400 mg by mouth at bedtime.    Yes [provider]  oxyCODONE (OXY IR/ROXICODONE) 5 MG immediate release tablet Take 1 tablet (5 mg total) by mouth every 6 (six) hours as needed for severe pain. 02/02/20  Yes Rai, Ripudeep K, MD  polyethylene glycol (MIRALAX / GLYCOLAX) 17 g packet Take 17 g by mouth daily as needed for moderate constipation.    Yes [provider]  polyvinyl alcohol (ARTIFICIAL TEARS) 1.4 % ophthalmic solution Place 2 drops into both eyes daily.    Yes [provider]  rosuvastatin (CRESTOR) 20 MG tablet Take 20 mg by mouth daily.   Yes [provider]  tamsulosin (FLOMAX) 0.4 MG CAPS capsule Take 0.4 mg by mouth every evening.  04/02/17  Yes [provider]    Inpatient  Medications: Scheduled Meds: . aspirin EC  81 mg Oral Daily  . azaTHIOprine  100 mg Oral BID  . feeding supplement  237 mL Oral TID BM  . hydroxychloroquine  400 mg Oral QHS  . rosuvastatin  20 mg Oral Daily  . tamsulosin  0.4 mg Oral QPM   Continuous Infusions: . heparin 950 Units/hr (04/27/20 1854)   PRN Meds: lidocaine, ondansetron (ZOFRAN) IV, oxyCODONE, polyethylene glycol  Allergies:   No Known Allergies  Social History:   Social History   Socioeconomic History  . Marital status: Married    Spouse name: Not on file  . Number of children: 5  . Years of education: Not on file  . Highest education level: Not on file  Occupational History  . Occupation: Engineer, production for [employer]    Comment: retired  Tobacco Use  . Smoking status: Former Smoker    Packs/day: 1.00    Years: 20.00    Pack years: 20.00  Types: Cigarettes    Quit date: 02/26/1993    Years since quitting: 27.1  . Smokeless tobacco: Never Used  . Tobacco comment: QUIT SMOKING 20 YEARS AGO  Vaping Use  . Vaping Use: Never used  Substance and Sexual Activity  . Alcohol use: No  . Drug use: No  . Sexual activity: Not on file  Other Topics Concern  . Not on file  Social History Narrative   No living will   Wife should make health care decisions--alternate is sons   Would accept resuscitation   Not sure about tube feeds   Social Determinants of Health   Financial Resource Strain:   . Difficulty of Paying Living Expenses: Not on file  Food Insecurity: No Food Insecurity  . Worried About Charity fundraiser in the Last Year: Never true  . Ran Out of Food in the Last Year: Never true  Transportation Needs: No Transportation Needs  . Lack of Transportation (Medical): No  . Lack of Transportation (Non-Medical): No  Physical Activity: Insufficiently Active  . Days of Exercise per Week: 4 days  . Minutes of Exercise per Session: 10 min  Stress:   . Feeling of Stress : Not on file   Social Connections:   . Frequency of Communication with Friends and Family: Not on file  . Frequency of Social Gatherings with Friends and Family: Not on file  . Attends Religious Services: Not on file  . Active Member of Clubs or Organizations: Not on file  . Attends Archivist Meetings: Not on file  . Marital Status: Not on file  Intimate Partner Violence:   . Fear of Current or Ex-Partner: Not on file  . Emotionally Abused: Not on file  . Physically Abused: Not on file  . Sexually Abused: Not on file    Family History:    Family History  Problem Relation Age of Onset  . Heart disease Mother   . Alcohol abuse Father   . Hyperlipidemia Sister   . Hypertension Sister   . Hyperlipidemia Brother   . Hypertension Brother   . Cancer Brother        ?lung cancer  . Stomach cancer Brother   . Diabetes Paternal Uncle   . Colon cancer Neg Hx   . Esophageal cancer Neg Hx   . Rectal cancer Neg Hx      ROS:  Please see the history of present illness.   All other ROS reviewed and negative.     Physical Exam/Data:   Vitals:   04/27/20 1648 04/27/20 1821 04/27/20 2151 04/28/20 0616  BP: 107/79 99/72 108/82 97/65  Pulse: 69 66 66 65  Resp:  16 15 15   Temp:  97.6 F (36.4 C) 98 F (36.7 C) 98.5 F (36.9 C)  TempSrc:  Oral Oral Oral  SpO2: 100% 100% 94% 92%  Weight:      Height:        Intake/Output Summary (Last 24 hours) at 04/28/2020 1031 Last data filed at 04/28/2020 0333 Gross per 24 hour  Intake --  Output 700 ml  Net -700 ml   Last 3 Weights 04/26/2020 04/08/2020 04/06/2020  Weight (lbs) 147 lb 147 lb 165 lb 5.5 oz  Weight (kg) 66.679 kg 66.679 kg 75 kg     Body mass index is 19.94 kg/m.  General: Thin in no acute distress HEENT: normal Lymph: no adenopathy Neck: no significant JVD Endocrine:  No thryomegaly Vascular: No carotid bruits;   Cardiac:  normal S1, S2; RRR; soft systolic murmur apex Lungs: Decreased breath sounds at bases.  No  significant increased respiratory effort at this time laying in bed.   Abd: soft, nontender, no hepatomegaly  Ext: no significant lower extremity edema Musculoskeletal:  No deformities, BUE and BLE strength normal and equal Skin: warm and dry  Neuro:  CNs 2-12 intact, no focal abnormalities noted Psych:  Normal affect   EKG:  The EKG was personally reviewed and demonstrates: Sinus rhythm 67 left atrial enlargement nonspecific ST-T wave changes/artifact  Telemetry:  Telemetry was personally reviewed and demonstrates: Sinus rhythm no adverse arrhythmias  Relevant CV Studies:  Echocardiogram 03/23/2020:  1. Left ventricular ejection fraction, by estimation, is 45 to 50%. The  left ventricle has mildly decreased function. The left ventricle  demonstrates regional wall motion abnormalities (see scoring  diagram/findings for description). Left ventricular  diastolic parameters are indeterminate.  2. Right ventricular systolic function is moderately reduced. The right  ventricular size is mildly enlarged. There is mildly elevated pulmonary  artery systolic pressure.  3. Left atrial size was moderately dilated.  4. Right atrial size was severely dilated.  5. The mitral valve is normal in structure. Moderate to severe mitral  valve regurgitation.  6. Tricuspid valve regurgitation is moderate to severe.  7. The aortic valve is normal in structure. There is mild calcification  of the aortic valve. There is mild thickening of the aortic valve. Aortic  valve regurgitation is mild. Mild aortic valve sclerosis is present, with  no evidence of aortic valve  stenosis.  8. Pulmonic valve regurgitation is moderate.  9. The inferior vena cava is normal in size with <50% respiratory  variability, suggesting right atrial pressure of 8 mmHg.   Conclusion(s)/Recommendation(s): Since last echo, EF appears to be more  reduced. Anteroseptal wall appears akinetic, and bordering walls are   hypokinetic. This is a similar pattern to prior echo, but is now more  pronounced.    Laboratory Data:  High Sensitivity Troponin:   Recent Labs  Lab 04/06/20 2134 04/06/20 2341  TROPONINIHS 28* 25*     Chemistry Recent Labs  Lab 04/26/20 1319 04/27/20 0337  NA 141 140  K 3.7 3.0*  CL 101 102  CO2 27 27  GLUCOSE 96 85  BUN 18 17  CREATININE 1.45* 1.37*  CALCIUM 9.0 8.5*  GFRNONAA 51* 54*  ANIONGAP 13 11    Recent Labs  Lab 04/27/20 0337 04/27/20 1125  PROT 6.1*  --   ALBUMIN 2.7* 3.0*  AST 21  --   ALT 19  --   ALKPHOS 48  --   BILITOT 1.1  --    Hematology Recent Labs  Lab 04/26/20 1319 04/27/20 0337 04/28/20 0300  WBC 4.1 3.7* 3.8*  RBC 4.46 4.15* 4.53  HGB 11.9* 11.2* 12.1*  HCT 40.1 36.5* 39.7  MCV 89.9 88.0 87.6  MCH 26.7 27.0 26.7  MCHC 29.7* 30.7 30.5  RDW 17.9* 18.0* 18.0*  PLT 187 192 170   BNP Recent Labs  Lab 04/26/20 1319  BNP 1,824.4*    DDimer No results for input(s): DDIMER in the last 168 hours.   Radiology/Studies:  DG Chest 1 View  Result Date: 04/27/2020 CLINICAL DATA:  Post right thoracentesis. EXAM: CHEST  1 VIEW COMPARISON:  April 26, 2020. FINDINGS: Decreased right pleural effusion without visible pneumothorax status post thoracentesis. The right costophrenic sulcus is incompletely visualized. Bibasilar reticular opacities, which likely represents bronchial wall thickening and mucous plugging better seen  on recent CT chest. No new confluent consolidation. Please see recent CT chest for characterization of pulmonary nodules. Similar cardiomediastinal silhouette. No acute osseous abnormality. IMPRESSION: 1. Decreased right pleural effusion without visible pneumothorax status post thoracentesis. 2. Bibasilar reticular opacities, which likely represents bronchial wall thickening and mucous plugging better seen on recent CT chest. 3. Please see recent CT chest for characterization of pulmonary nodules. Electronically Signed    By: Margaretha Sheffield MD   On: 04/27/2020 09:44   DG Chest 2 View  Result Date: 04/26/2020 CLINICAL DATA:  Shortness of breath.  Chest pain. EXAM: CHEST - 2 VIEW COMPARISON:  04/06/2020. FINDINGS: Mediastinum hilar structures stable. Surgical clips and left atrial appendage clip noted over the stable cardiomegaly scratched it cardiomegaly with pulmonary venous congestion again noted. Bilateral interstitial prominence most consistent interstitial edema again noted. Interim progression from prior exam. Small right pleural effusion again noted. No pneumothorax. Thoracic spine scoliosis. IMPRESSION: Cardiomegaly with pulmonary venous congestion and bilateral interstitial prominence most consistent with interstitial edema. Interim progression from prior exam. Small right pleural effusion again noted. Electronically Signed   By: Marcello Moores  Register   On: 04/26/2020 13:46   CT Angio Chest PE W/Cm &/Or Wo Cm  Result Date: 04/26/2020 CLINICAL DATA:  Shortness of breath. History of congestive heart failure and PE. EXAM: CT ANGIOGRAPHY CHEST WITH CONTRAST TECHNIQUE: Multidetector CT imaging of the chest was performed using the standard protocol during bolus administration of intravenous contrast. Multiplanar CT image reconstructions and MIPs were obtained to evaluate the vascular anatomy. CONTRAST:  40mL OMNIPAQUE IOHEXOL 350 MG/ML SOLN COMPARISON:  03/23/2020 FINDINGS: Cardiovascular: Contrast injection is sufficient to demonstrate satisfactory opacification of the pulmonary arteries to the segmental level. There is suboptimal opacification of the lobar, segmental, and subsegmental pulmonary artery branches of the right lower lobe. There is suboptimal opacification of the subsegmental branches of the left lower lobe. The size of the main pulmonary artery is enlarged, measuring 3.8 cm. Moderate cardiomegaly. There is reflux of contrast into the IVC. There are atherosclerotic changes of the thoracic aorta without  evidence for an aneurysm. Coronary artery calcifications are noted. Mediastinum/Nodes: -- No mediastinal lymphadenopathy. -- No hilar lymphadenopathy. -- No axillary lymphadenopathy. -- No supraclavicular lymphadenopathy. -- Normal thyroid gland where visualized. -  Unremarkable esophagus. Lungs/Pleura: There is a large right-sided pleural effusion. Emphysematous changes are noted bilaterally. There is a 1.1 cm pulmonary nodule in the left upper lobe which has increased in size from the prior study (axial series 6, image 27). There is a ground-glass airspace opacity in the left upper lobe measuring approximately 2.1 cm which has increased in size from the prior study when it measured approximately 1.3 cm (axial series 6, image 55) parent there is diffuse bronchial wall thickening and mucus plugging bilaterally. There is atelectasis versus consolidation in the right lower lobe. There is a 9 mm pulmonary nodule in the right middle lobe, increased in size from prior study (axial series 6, image 86). There is a small left-sided pleural effusion. Upper Abdomen: Contrast bolus timing is not optimized for evaluation of the abdominal organs. There is a small volume of free fluid in the upper abdomen. There are few scattered colonic diverticula. Musculoskeletal: No chest wall abnormality. No bony spinal canal stenosis. Review of the MIP images confirms the above findings. IMPRESSION: 1. Again noted is poor opacification of the bilateral lower lobe pulmonary arteries. Differential considerations include underlying pulmonary emboli as before versus suboptimal opacification secondary to contrast bolus timing. 2. Large  right-sided pleural effusion. There is a small left-sided pleural effusion. 3. Cardiomegaly with reflux of contrast in the IVC consistent with underlying cardiac dysfunction. 4. Bilateral bronchial wall thickening and mucus plugging consistent with infectious or reactive bronchiolitis. 5. Growing bilateral  pulmonary nodules. While these may be infectious or inflammatory in etiology, malignancy is not excluded. Pulmonary medicine consultation is recommended. 6. Small volume ascites in the upper abdomen. Aortic Atherosclerosis (ICD10-I70.0) and Emphysema (ICD10-J43.9). Electronically Signed   By: Constance Holster M.D.   On: 04/26/2020 19:13   DG CHEST PORT 1 VIEW  Result Date: 04/27/2020 CLINICAL DATA:  Follow-up right thoracentesis EXAM: PORTABLE CHEST 1 VIEW COMPARISON:  Earlier same day FINDINGS: Cardiomegaly. Mediastinal surgical clip. Atrial clip. Aortic atherosclerotic calcification. Chronic interstitial lung markings. No evidence of pneumothorax or visible pleural fluid. Question development of pulmonary venous hypertension since the previous exam. IMPRESSION: No pneumothorax following thoracentesis. Question development of pulmonary venous hypertension. Electronically Signed   By: Nelson Chimes M.D.   On: 04/27/2020 16:46   IR THORACENTESIS ASP PLEURAL SPACE W/IMG GUIDE  Result Date: 04/27/2020 INDICATION: Patient with history of lupus, interstitial disease, CAD, chronic systolic CHF, dyspnea, and right pleural effusion. Request made for diagnostic and therapeutic right thoracentesis. EXAM: ULTRASOUND GUIDED DIAGNOSTIC AND THERAPEUTIC RIGHT THORACENTESIS MEDICATIONS: 10 mL% lidocaine COMPLICATIONS: None immediate.  No pneumothorax on follow-up radiograph. PROCEDURE: An ultrasound guided thoracentesis was thoroughly discussed with the patient and questions answered. The benefits, risks, alternatives and complications were also discussed. The patient understands and wishes to proceed with the procedure. Written consent was obtained. Ultrasound was performed to localize and Janiah Devinney an adequate pocket of fluid in the right chest. The area was then prepped and draped in the normal sterile fashion. 1% Lidocaine was used for local anesthesia. Under ultrasound guidance a 6 Fr Safe-T-Centesis catheter was  introduced. Thoracentesis was performed. The catheter was removed and a dressing applied. FINDINGS: A total of approximately 1.1 L of hazy gold fluid was removed. Samples were sent to the laboratory as requested by the clinical team. IMPRESSION: Successful ultrasound guided right thoracentesis yielding 1.1 L of pleural fluid. Read by: Earley Abide, PA-C Electronically Signed   By: Lucrezia Europe M.D.   On: 04/27/2020 10:13     Assessment and Plan:   74 year old with recent CABG May 2021, mildly reduced ejection fraction, elevated BNP, 20 pound weight loss unintentional with decreased appetite, increasing pleural effusion, prior PE and paroxysmal atrial fibrillation on Eliquis currently on hold after thoracentesis with interstitial lung disease.  Acute on chronic systolic/diastolic heart failure -Pleural effusions likely from elevated left atrial pressures.  Agree with continued gentle diuresis which is at times inhibited by hypotension.  Albumin 3.0, 2.7 also contributing to effusions.  This is secondary to his failure to thrive, severe protein calorie malnutrition. -His decrease in appetite could be multifactorial from his underlying lupus as well as chronic disease processes, chronic heart failure with congestion.  Continue to optimize as best as possible with diuresis. -Chest CT demonstrates:The size of the main pulmonary artery is enlarged, measuring 3.8 cm. Moderate cardiomegaly. There is reflux of contrast into the IVC.  This is all compatible with right-sided heart failure as well likely given underlying lung disease.  Certainly hepatic congestion etc. can cause decreased appetite as well.  Once again, diuresis decongestion as tolerated would be optimal.  Moderate to severe mitral regurgitation, severe tricuspid regurgitation with RV failure, cor pulmonale -Continue to optimize fluid status.  Replete potassium as needed. -It is  likely that as fluid is decreased, intravascular volume is  decreased, valvular lesions hopefully will follow.  Paroxysmal atrial fibrillation/prior PE -Resume Eliquis when possible, when procedures are no longer eminent.  Upcoming bronchoscopy noted.  Extremely complex situation, extensive data review.          :[id number]  For questions or updates, please contact [place] Please consult [email or web address] for contact info under    Signed, Candee Furbish, MD  04/28/2020 10:31 AM

## 2020-04-28 NOTE — Progress Notes (Signed)
Pt transferred to 3E08. Report given to RN.

## 2020-04-28 NOTE — Progress Notes (Signed)
Progress Note    Cody Arellano  DDU:[id number] DOB: [birth year]  DOA: 04/26/2020 PCP: Venia Carbon, MD    Brief Narrative:     Medical records reviewed and are as summarized below:  Cody Arellano is an 74 y.o. male with medical history significant forlupus, interstitial lung disease, coronary artery disease, chronic systolic CHF, paroxysmal atrial fibrillation on Eliquis, and recent PEwho presents to the emergency department due to shortness of breath.  THis is his 3rd hospitalization in the last 2 months for the same/similar issues.    Assessment/Plan:   Principal Problem:   Pleural effusion on right Active Problems:   CAD (coronary artery disease)   Hyperlipemia   Paroxysmal atrial fibrillation (HCC)   Heart failure, systolic, chronic (HCC)   Pulmonary embolism (HCC)   Acute on chronic systolic CHF (congestive heart failure) (HCC)   Shortness of breath   Elevated brain natriuretic peptide (BNP) level   AKI (acute kidney injury) (HCC)   Pulmonary nodules   Nausea & vomiting   Dyspnea possibly secondary to multifactorial: lupus, right pleural effusion,CHF, pulmonary embolism  -C-T angiography of chest showed large right-sided pleural effusion and small left-sided pleural effusion also noted.   -S/p diagnostic and therapeutic thoracentesis with 1.1 L retrieval:  -sats low 90s on RA -daily weights/I/Os -cards consult as echo from 10/21 changed from CABG: evolution of echo studies from TEE intraoperative and thoracic one post op to recent echo in hospital.  Now with dilated RA and LA and severe TR/MR which was not there before -will transfer to progressive care  Nausea and vomiting: intermittant Continue IV Zofran p.r.n.  Hypokalemia on labs from 11/24 -not sure was replaced -recheck today  Pulmonary nodules CT angiography of chest showed growing bilateral pulmonary nodules suspected to be due to infectious or inflammatory in etiology, malignancy is not  excluded -consult from [place] planned for 11/26 -had CABG in May  Acute kidney injury:  -Presented with creatinine of 1.45.  At baseline his creatinine is normal.  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 Continue Plaquenil and Imuran    Family Communication/Anticipated D/C date and plan/Code Status   DVT prophylaxis: heparin Code Status: Full Code.  Family Communication: son at bedside Disposition Plan: Status is: Inpatient  Remains inpatient appropriate because:Inpatient level of care appropriate due to severity of illness   Dispo: The patient is from: Home              Anticipated d/c is to: Home              Anticipated d/c date is: > 3 days              Patient currently is not medically stable to d/c. cards eval, bronch in AM per PCCM         Medical Consultants:    Cards  IR  PCCM     Subjective:   Having episodes of nausea that usually are followed by worsening SOB  Objective:    Vitals:   04/27/20 1648 04/27/20 1821 04/27/20 2151 04/28/20 0616  BP: 107/79 99/72 108/82 97/65  Pulse: 69 66 66 65  Resp:  16 15 15   Temp:  97.6 F (36.4 C) 98 F (36.7 C) 98.5 F (36.9 C)  TempSrc:  Oral Oral Oral  SpO2: 100% 100% 94% 92%  Weight:      Height:        Intake/Output Summary (Last 24 hours) at 04/28/2020 1054 Last data filed at 04/28/2020 3149 Gross per 24 hour  Intake --  Output 700 ml  Net -700 ml   Filed Weights   04/26/20 1317  Weight: 66.7 kg    Exam:  General: Appearance:    Frail male in no acute distress, poor dentition      Lungs:     respirations unlabored, no wheezing,  diminished  Heart:    Normal heart rate.   MS:   All extremities are intact.   Neurologic:   Awake, alert, oriented x 3    Data Reviewed:   I have personally reviewed following labs and imaging studies:  Labs: Labs show the following:   Basic Metabolic Panel: Recent Labs  Lab 04/26/20 1319 04/27/20 0337  NA 141 140  K 3.7 3.0*  CL 101 102  CO2 27 27  GLUCOSE 96 85  BUN 18 17  CREATININE 1.45* 1.37*  CALCIUM 9.0 8.5*  MG  --  1.9  PHOS  --  4.1   GFR Estimated Creatinine Clearance: 44.6 mL/min (A) (by C-G formula based on SCr of 1.37 mg/dL (H)). Liver Function Tests: Recent Labs  Lab 04/27/20 0337 04/27/20 1125  AST 21  --   ALT 19  --   ALKPHOS 48  --   BILITOT 1.1  --   PROT 6.1*  --   ALBUMIN 2.7* 3.0*   No results for input(s): LIPASE, AMYLASE in the last 168 hours. No results for input(s): AMMONIA in the last 168 hours. Coagulation profile Recent Labs  Lab 04/27/20 0337  INR 2.1*    CBC: Recent Labs  Lab 04/26/20 1319 04/27/20 0337 04/28/20 0300  WBC 4.1 3.7* 3.8*  HGB 11.9* 11.2* 12.1*  HCT 40.1 36.5* 39.7  MCV 89.9 88.0 87.6  PLT 187 192 170   Cardiac Enzymes: No results for input(s): CKTOTAL, CKMB, CKMBINDEX, TROPONINI in the last 168 hours. BNP (last 3 results) No results for input(s): PROBNP in the last 8760 hours. CBG: No results for input(s): GLUCAP in the last 168 hours. D-Dimer: No results for input(s): DDIMER in the last 72 hours. Hgb A1c: No results for input(s): HGBA1C in the last 72 hours. Lipid Profile: No results for input(s): CHOL, HDL, LDLCALC, TRIG, CHOLHDL, LDLDIRECT in the last 72 hours. Thyroid function studies: No results for input(s): TSH, T4TOTAL, T3FREE, THYROIDAB in the last 72 hours.  Invalid input(s): FREET3 Anemia work up: No results for input(s): VITAMINB12, FOLATE, FERRITIN, TIBC, IRON, RETICCTPCT in the last 72 hours. Sepsis Labs: Recent Labs  Lab 04/26/20 1319 04/27/20 0337 04/28/20 0300  WBC  4.1 3.7* 3.8*    Microbiology Recent Results (from the past 240 hour(s))  Respiratory Panel by RT PCR (Flu A&B, Covid) - Nasopharyngeal Swab     Status: None   Collection Time: 04/26/20  3:06 PM   Specimen: Nasopharyngeal Swab; Nasopharyngeal(NP) swabs in vial transport medium  Result Value Ref Range Status   SARS Coronavirus 2 by RT PCR NEGATIVE NEGATIVE Final    Comment: (NOTE) SARS-CoV-2 target nucleic acids are NOT DETECTED.  The SARS-CoV-2 RNA is generally detectable in upper respiratoy specimens during the acute phase of infection. The lowest concentration of SARS-CoV-2  viral copies this assay can detect is 131 copies/mL. A negative result does not preclude SARS-Cov-2 infection and should not be used as the sole basis for treatment or other patient management decisions. A negative result may occur with  improper specimen collection/handling, submission of specimen other than nasopharyngeal swab, presence of viral mutation(s) within the areas targeted by this assay, and inadequate number of viral copies (<131 copies/mL). A negative result must be combined with clinical observations, patient history, and epidemiological information. The expected result is Negative.  Fact Sheet for Patients:  PinkCheek.be  Fact Sheet for Healthcare Providers:  GravelBags.it  This test is no t yet approved or cleared by the [place] FDA and  has been authorized for detection and/or diagnosis of SARS-CoV-2 by FDA under an Emergency Use Authorization (EUA). This EUA will remain  in effect (meaning this test can be used) for the duration of the COVID-19 declaration under Section 564(b)(1) of the Act, 21 U.S.C. section 360bbb-3(b)(1), unless the authorization is terminated or revoked sooner.     Influenza A by PCR NEGATIVE NEGATIVE Final   Influenza B by PCR NEGATIVE NEGATIVE Final    Comment: (NOTE) The Xpert Xpress  SARS-CoV-2/FLU/RSV assay is intended as an aid in  the diagnosis of influenza from Nasopharyngeal swab specimens and  should not be used as a sole basis for treatment. Nasal washings and  aspirates are unacceptable for Xpert Xpress SARS-CoV-2/FLU/RSV  testing.  Fact Sheet for Patients: PinkCheek.be  Fact Sheet for Healthcare Providers: GravelBags.it  This test is not yet approved or cleared by the [place] FDA and  has been authorized for detection and/or diagnosis of SARS-CoV-2 by  FDA under an Emergency Use Authorization (EUA). This EUA will remain  in effect (meaning this test can be used) for the duration of the  Covid-19 declaration under Section 564(b)(1) of the Act, 21  U.S.C. section 360bbb-3(b)(1), unless the authorization is  terminated or revoked. Performed at [facility], [place] [address] [place], [place] [address]   Gram stain     Status: None   Collection Time: 04/27/20  9:43 AM   Specimen: Lung, Right; Pleural Fluid  Result Value Ref Range Status   Specimen Description PLEURAL FLUID  Final   Special Requests RIGHT LUNG  Final   Gram Stain   Final    CYTOSPIN SMEAR WBC PRESENT,BOTH PMN AND MONONUCLEAR NO ORGANISMS SEEN Performed at [facility], [place] [address], [place], [place] [address]    Report Status 04/27/2020 FINAL  Final  Acid Fast Smear (AFB)     Status: None   Collection Time: 04/27/20  9:43 AM   Specimen: Lung, Right; Pleural Fluid  Result Value Ref Range Status   AFB Specimen Processing Concentration  Final   Acid Fast Smear Negative  Final    Comment: (NOTE) Performed At: [facility] [place], [place] [id number] Rush Farmer MD OE:[id number]    Source (AFB) PLEURAL  Final    Comment: FLUID RIGHT LUNG Performed at [facility], [place] [address], [place], [place] [address]   Culture, body fluid-bottle     Status: None (Preliminary result)    Collection Time: 04/27/20  9:43 AM   Specimen: Pleura  Result Value Ref Range Status   Specimen Description PLEURAL FLUID  Final   Special Requests RIGHT LUNG  Final   Culture   Final    NO GROWTH < 24 HOURS Performed at [facility], [address] [place],  [address]    Report Status PENDING  Incomplete    Procedures and diagnostic studies:  DG Chest 1 View  Result Date: 04/27/2020 CLINICAL DATA:  Post right thoracentesis. EXAM: CHEST  1 VIEW COMPARISON:  April 26, 2020. FINDINGS: Decreased right pleural effusion without visible pneumothorax status post thoracentesis. The right costophrenic sulcus is incompletely visualized. Bibasilar reticular opacities, which likely represents bronchial wall thickening and mucous plugging better seen on recent CT chest. No new confluent consolidation. Please see recent CT chest for characterization of pulmonary nodules. Similar cardiomediastinal silhouette. No acute osseous abnormality. IMPRESSION: 1. Decreased right pleural effusion without visible pneumothorax status post thoracentesis. 2. Bibasilar reticular opacities, which likely represents bronchial wall thickening and mucous plugging better seen on recent CT chest. 3. Please see recent CT chest for characterization of pulmonary nodules. Electronically Signed   By: Margaretha Sheffield MD   On: 04/27/2020 09:44   DG Chest 2 View  Result Date: 04/26/2020 CLINICAL DATA:  Shortness of breath.  Chest pain. EXAM: CHEST - 2 VIEW COMPARISON:  04/06/2020. FINDINGS: Mediastinum hilar structures stable. Surgical clips and left atrial appendage clip noted over the stable cardiomegaly scratched it cardiomegaly with pulmonary venous congestion again noted. Bilateral interstitial prominence most consistent interstitial edema again noted. Interim progression from prior exam. Small right pleural effusion again noted. No pneumothorax. Thoracic spine scoliosis. IMPRESSION: Cardiomegaly with pulmonary  venous congestion and bilateral interstitial prominence most consistent with interstitial edema. Interim progression from prior exam. Small right pleural effusion again noted. Electronically Signed   By: Marcello Moores  Register   On: 04/26/2020 13:46   CT Angio Chest PE W/Cm &/Or Wo Cm  Result Date: 04/26/2020 CLINICAL DATA:  Shortness of breath. History of congestive heart failure and PE. EXAM: CT ANGIOGRAPHY CHEST WITH CONTRAST TECHNIQUE: Multidetector CT imaging of the chest was performed using the standard protocol during bolus administration of intravenous contrast. Multiplanar CT image reconstructions and MIPs were obtained to evaluate the vascular anatomy. CONTRAST:  70mL OMNIPAQUE IOHEXOL 350 MG/ML SOLN COMPARISON:  03/23/2020 FINDINGS: Cardiovascular: Contrast injection is sufficient to demonstrate satisfactory opacification of the pulmonary arteries to the segmental level. There is suboptimal opacification of the lobar, segmental, and subsegmental pulmonary artery branches of the right lower lobe. There is suboptimal opacification of the subsegmental branches of the left lower lobe. The size of the main pulmonary artery is enlarged, measuring 3.8 cm. Moderate cardiomegaly. There is reflux of contrast into the IVC. There are atherosclerotic changes of the thoracic aorta without evidence for an aneurysm. Coronary artery calcifications are noted. Mediastinum/Nodes: -- No mediastinal lymphadenopathy. -- No hilar lymphadenopathy. -- No axillary lymphadenopathy. -- No supraclavicular lymphadenopathy. -- Normal thyroid gland where visualized. -  Unremarkable esophagus. Lungs/Pleura: There is a large right-sided pleural effusion. Emphysematous changes are noted bilaterally. There is a 1.1 cm pulmonary nodule in the left upper lobe which has increased in size from the prior study (axial series 6, image 27). There is a ground-glass airspace opacity in the left upper lobe measuring approximately 2.1 cm which has  increased in size from the prior study when it measured approximately 1.3 cm (axial series 6, image 55) parent there is diffuse bronchial wall thickening and mucus plugging bilaterally. There is atelectasis versus consolidation in the right lower lobe. There is a 9 mm pulmonary nodule in the right middle lobe, increased in size from prior study (axial series 6, image 86). There is a small left-sided pleural effusion. Upper Abdomen: Contrast bolus timing  is not optimized for evaluation of the abdominal organs. There is a small volume of free fluid in the upper abdomen. There are few scattered colonic diverticula. Musculoskeletal: No chest wall abnormality. No bony spinal canal stenosis. Review of the MIP images confirms the above findings. IMPRESSION: 1. Again noted is poor opacification of the bilateral lower lobe pulmonary arteries. Differential considerations include underlying pulmonary emboli as before versus suboptimal opacification secondary to contrast bolus timing. 2. Large right-sided pleural effusion. There is a small left-sided pleural effusion. 3. Cardiomegaly with reflux of contrast in the IVC consistent with underlying cardiac dysfunction. 4. Bilateral bronchial wall thickening and mucus plugging consistent with infectious or reactive bronchiolitis. 5. Growing bilateral pulmonary nodules. While these may be infectious or inflammatory in etiology, malignancy is not excluded. Pulmonary medicine consultation is recommended. 6. Small volume ascites in the upper abdomen. Aortic Atherosclerosis (ICD10-I70.0) and Emphysema (ICD10-J43.9). Electronically Signed   By: Constance Holster M.D.   On: 04/26/2020 19:13   DG CHEST PORT 1 VIEW  Result Date: 04/27/2020 CLINICAL DATA:  Follow-up right thoracentesis EXAM: PORTABLE CHEST 1 VIEW COMPARISON:  Earlier same day FINDINGS: Cardiomegaly. Mediastinal surgical clip. Atrial clip. Aortic atherosclerotic calcification. Chronic interstitial lung markings. No  evidence of pneumothorax or visible pleural fluid. Question development of pulmonary venous hypertension since the previous exam. IMPRESSION: No pneumothorax following thoracentesis. Question development of pulmonary venous hypertension. Electronically Signed   By: Nelson Chimes M.D.   On: 04/27/2020 16:46   IR THORACENTESIS ASP PLEURAL SPACE W/IMG GUIDE  Result Date: 04/27/2020 INDICATION: Patient with history of lupus, interstitial disease, CAD, chronic systolic CHF, dyspnea, and right pleural effusion. Request made for diagnostic and therapeutic right thoracentesis. EXAM: ULTRASOUND GUIDED DIAGNOSTIC AND THERAPEUTIC RIGHT THORACENTESIS MEDICATIONS: 10 mL% lidocaine COMPLICATIONS: None immediate.  No pneumothorax on follow-up radiograph. PROCEDURE: An ultrasound guided thoracentesis was thoroughly discussed with the patient and questions answered. The benefits, risks, alternatives and complications were also discussed. The patient understands and wishes to proceed with the procedure. Written consent was obtained. Ultrasound was performed to localize and mark an adequate pocket of fluid in the right chest. The area was then prepped and draped in the normal sterile fashion. 1% Lidocaine was used for local anesthesia. Under ultrasound guidance a 6 Fr Safe-T-Centesis catheter was introduced. Thoracentesis was performed. The catheter was removed and a dressing applied. FINDINGS: A total of approximately 1.1 L of hazy gold fluid was removed. Samples were sent to the laboratory as requested by the clinical team. IMPRESSION: Successful ultrasound guided right thoracentesis yielding 1.1 L of pleural fluid. Read by: Earley Abide, PA-C Electronically Signed   By: Lucrezia Europe M.D.   On: 04/27/2020 10:13    Medications:   . aspirin EC  81 mg Oral Daily  . azaTHIOprine  100 mg Oral BID  . feeding supplement  237 mL Oral TID BM  . hydroxychloroquine  400 mg Oral QHS  . rosuvastatin  20 mg Oral Daily  . tamsulosin   0.4 mg Oral QPM   Continuous Infusions: . heparin 950 Units/hr (04/27/20 1854)     LOS: 2 days   Geradine Girt  [facility]   How to contact the [facility] Attending or Consulting provider [place] or covering provider during after hours [place], for this patient?  1. Check the care team in [facility] and look for a) attending/consulting [facility] provider listed and b) the [facility] team listed 2. Log into [email or web address] and use [place]'s universal password to  access. If you do not have the password, please contact the hospital operator. 3. Locate the [facility] provider you are looking for under [facility] and page to a number that you can be directly reached. 4. If you still have difficulty reaching the provider, please page the [facility] (Director on Call) for the Hospitalists listed on amion for assistance.  04/28/2020, 10:54 AM

## 2020-04-28 NOTE — Progress Notes (Signed)
  Echocardiogram 2D Echocardiogram limited with definity and 3D has been performed.  Darlina Sicilian M 04/28/2020, 3:52 PM

## 2020-04-29 ENCOUNTER — Encounter (HOSPITAL_COMMUNITY): Payer: Self-pay | Admitting: Internal Medicine

## 2020-04-29 ENCOUNTER — Inpatient Hospital Stay (HOSPITAL_COMMUNITY): Payer: No Typology Code available for payment source | Admitting: Anesthesiology

## 2020-04-29 ENCOUNTER — Encounter (HOSPITAL_COMMUNITY)
Admission: EM | Disposition: A | Discharge status: Rehabilitation Facility | Payer: Self-pay | Source: Home / Self Care | Attending: Cardiology

## 2020-04-29 DIAGNOSIS — J90 Pleural effusion, not elsewhere classified: Secondary | ICD-10-CM

## 2020-04-29 DIAGNOSIS — R918 Other nonspecific abnormal finding of lung field: Secondary | ICD-10-CM

## 2020-04-29 HISTORY — PX: BRONCHIAL WASHINGS: SHX5105

## 2020-04-29 HISTORY — PX: VIDEO BRONCHOSCOPY: SHX5072

## 2020-04-29 LAB — CBC
HCT: 36.9 % — ABNORMAL LOW (ref 39.0–52.0)
Hemoglobin: 11.4 g/dL — ABNORMAL LOW (ref 13.0–17.0)
MCH: 27.3 pg (ref 26.0–34.0)
MCHC: 30.9 g/dL (ref 30.0–36.0)
MCV: 88.5 fL (ref 80.0–100.0)
Platelets: 165 10*3/uL (ref 150–400)
RBC: 4.17 MIL/uL — ABNORMAL LOW (ref 4.22–5.81)
RDW: 17.7 % — ABNORMAL HIGH (ref 11.5–15.5)
WBC: 3.1 10*3/uL — ABNORMAL LOW (ref 4.0–10.5)
nRBC: 0 % (ref 0.0–0.2)

## 2020-04-29 LAB — BODY FLUID CELL COUNT WITH DIFFERENTIAL
Eos, Fluid: 0 %
Eos, Fluid: 0 %
Lymphs, Fluid: 17 %
Lymphs, Fluid: 29 %
Monocyte-Macrophage-Serous Fluid: 27 % — ABNORMAL LOW (ref 50–90)
Monocyte-Macrophage-Serous Fluid: 45 % — ABNORMAL LOW (ref 50–90)
Neutrophil Count, Fluid: 24 % (ref 0–25)
Neutrophil Count, Fluid: 52 % — ABNORMAL HIGH (ref 0–25)
Other Cells, Fluid: 2 %
Other Cells, Fluid: 4 %
Total Nucleated Cell Count, Fluid: 283 cu mm (ref 0–1000)
Total Nucleated Cell Count, Fluid: 288 cu mm (ref 0–1000)

## 2020-04-29 LAB — PH, BODY FLUID: pH, Body Fluid: 7.6

## 2020-04-29 LAB — BASIC METABOLIC PANEL
Anion gap: 12 (ref 5–15)
BUN: 20 mg/dL (ref 8–23)
CO2: 25 mmol/L (ref 22–32)
Calcium: 8.4 mg/dL — ABNORMAL LOW (ref 8.9–10.3)
Chloride: 102 mmol/L (ref 98–111)
Creatinine, Ser: 1.47 mg/dL — ABNORMAL HIGH (ref 0.61–1.24)
GFR, Estimated: 50 mL/min — ABNORMAL LOW (ref >60)
Glucose, Bld: 68 mg/dL — ABNORMAL LOW (ref 70–99)
Potassium: 3.7 mmol/L (ref 3.5–5.1)
Sodium: 139 mmol/L (ref 135–145)

## 2020-04-29 LAB — FLOW CYTOMETRY REQUEST - FLUID (INPATIENT)

## 2020-04-29 LAB — CYTOLOGY - NON PAP

## 2020-04-29 LAB — APTT: aPTT: 74 seconds — ABNORMAL HIGH (ref 24–36)

## 2020-04-29 LAB — HEPARIN LEVEL (UNFRACTIONATED): Heparin Unfractionated: >2.2 IU/mL — ABNORMAL HIGH (ref 0.30–0.70)

## 2020-04-29 SURGERY — VIDEO BRONCHOSCOPY WITHOUT FLUORO
Anesthesia: General

## 2020-04-29 MED ORDER — ONDANSETRON HCL 4 MG/2ML IJ SOLN
INTRAMUSCULAR | Status: AC
  Filled 2020-04-29: qty 2

## 2020-04-29 MED ORDER — ETOMIDATE 2 MG/ML IV SOLN
INTRAVENOUS | Status: DC | PRN
  Administered 2020-04-29: 14 mg via INTRAVENOUS

## 2020-04-29 MED ORDER — LACTATED RINGERS IV SOLN: INTRAVENOUS | Status: DC

## 2020-04-29 MED ORDER — ONDANSETRON HCL 4 MG/2ML IJ SOLN
INTRAMUSCULAR | Status: DC | PRN
  Administered 2020-04-29: 4 mg via INTRAVENOUS

## 2020-04-29 MED ORDER — SUGAMMADEX SODIUM 200 MG/2ML IV SOLN
INTRAVENOUS | Status: DC | PRN
Start: 2020-04-29 — End: 2020-04-29
  Administered 2020-04-29: 250 mg via INTRAVENOUS

## 2020-04-29 MED ORDER — ROCURONIUM BROMIDE 10 MG/ML (PF) SYRINGE
PREFILLED_SYRINGE | INTRAVENOUS | Status: DC | PRN
  Administered 2020-04-29: 70 mg via INTRAVENOUS

## 2020-04-29 MED ORDER — FENTANYL CITRATE (PF) 100 MCG/2ML IJ SOLN
INTRAMUSCULAR | Status: DC | PRN
Start: 2020-04-29 — End: 2020-04-29
  Administered 2020-04-29 (×2): 50 ug via INTRAVENOUS

## 2020-04-29 MED ORDER — LIDOCAINE 2% (20 MG/ML) 5 ML SYRINGE
INTRAMUSCULAR | Status: DC | PRN
  Administered 2020-04-29: 40 mg via INTRAVENOUS
  Administered 2020-04-29: 60 mg via INTRAVENOUS

## 2020-04-29 NOTE — Progress Notes (Signed)
ANTICOAGULATION CONSULT NOTE   Pharmacy Consult for Heparin Indication: atrial fibrillation and pulmonary embolus  No Known Allergies  Patient Measurements: Height: 6' (182.9 cm) Weight: 61.9 kg (136 lb 8 oz) IBW/kg (Calculated) : 77.6 Heparin Dosing Weight: 66.7 kg  Vital Signs: Temp: 98 F (36.7 C) (11/26 0601) Temp Source: Oral (11/26 0601) BP: 101/74 (11/26 0601) Pulse Rate: 59 (11/26 0601)  Labs: Recent Labs    04/26/20 1319 04/26/20 1319 04/27/20 0337 04/27/20 0337 04/28/20 0300 04/28/20 1200 04/29/20 0336  HGB 11.9*   < > 11.2*   < > 12.1*  --  11.4*  HCT 40.1   < > 36.5*  --  39.7  --  36.9*  PLT 187   < > 192  --  170  --  165  APTT  --   --  33   < > 71* 73* 74*  LABPROT  --   --  22.6*  --   --   --   --   INR  --   --  2.1*  --   --   --   --   HEPARINUNFRC  --   --   --   --  2.20*  --  >2.20*  CREATININE 1.45*  --  1.37*  --   --  1.60*  --    < > = values in this interval not displayed.    Estimated Creatinine Clearance: 35.5 mL/min (A) (by C-G formula based on SCr of 1.6 mg/dL (H)).  Assessment: 89 yoM on apixaban PTA for hx PE (03/2020) and AFib admitted with SOB and pleural effusion. Apixaban held for thoracentesis 11/24, IV heparin started and continued pending other procedures.  Heparin level remains falsely elevated by DOAC, aPTT is therapeutic at  74 seconds, CBC stable.  Goal of Therapy:  Heparin level 0.3-0.7 units/ml aPTT 66-102 seconds Monitor platelets by anticoagulation protocol: Yes   Plan:  -Continue heparin at 950 units/hr. -Daily heparin level and aPTT until correlating.  -Monitor CBC and S/Sx bleeding  Arrie Senate, PharmD, BCPS, [facility] Clinical Pharmacist [phone] Please check AMION for all [facility] Pharmacy numbers 04/29/2020

## 2020-04-29 NOTE — Anesthesia Procedure Notes (Signed)
Procedure Name: Intubation Date/Time: 04/29/2020 12:12 PM Performed by: Clearnce Sorrel, CRNA Pre-anesthesia Checklist: Patient identified, Emergency Drugs available, Suction available, Patient being monitored and Timeout performed Patient Re-evaluated:Patient Re-evaluated prior to induction Oxygen Delivery Method: Circle system utilized Preoxygenation: Pre-oxygenation with 100% oxygen Induction Type: IV induction Ventilation: Mask ventilation without difficulty Laryngoscope Size: Mac and 4 Grade View: Grade I Tube type: Oral Tube size: 7.5 mm Number of attempts: 1 Airway Equipment and Method: Stylet Placement Confirmation: ETT inserted through vocal cords under direct vision,  positive ETCO2 and breath sounds checked- equal and bilateral Secured at: 23 cm Tube secured with: Tape Dental Injury: Teeth and Oropharynx as per pre-operative assessment

## 2020-04-29 NOTE — Interval H&P Note (Signed)
History and Physical Interval Note:  04/29/2020 11:59 AM  Cody Arellano  has presented today for surgery, with the diagnosis of pulmonary nodules, immunocompromised.  The various methods of treatment have been discussed with the patient and family. After consideration of risks, benefits and other options for treatment, the patient has consented to  Procedure(s): VIDEO BRONCHOSCOPY WITHOUT FLUORO (N/A) as a surgical intervention.  The patient's history has been reviewed, patient examined, no change in status, stable for surgery.  I have reviewed the patient's chart and labs.  Questions were answered to the patient's satisfaction.     [place]

## 2020-04-29 NOTE — Progress Notes (Signed)
NAME:  Cody Arellano, MRN:  [id number], DOB:  21-May-1946, LOS: 3 ADMISSION DATE:  04/26/2020, CONSULTATION DATE:  04/29/20 REFERRING MD:  Geradine Girt, DO, CHIEF COMPLAINT:  DOE   Brief History   74 year old with ischemic cardiomyopathy EF 45%, SLE on imuran and plaquenil whom we are consulted for evaluation of pulmonary nodules.   History of present illness   Notes DOE, SOB at rest since heart surgery many months ago. Breathing worsens at times. Admitted for volume overload in last couple of months. Felt similar. Came to ED. BNP at highest level. CT with bilateral R>L pleural effusions as well as nodules discussed below. Admitted. Received IV lasix. Had thora this morning. Initial studies consistent with transudate, lymph predominant. Feels breathing is improved post procedure.  Notes 20 or so pound weight loss over several months. Has had no appetite in months. No fever or chills. No significant cough. No night sweats.   Diagnosed with lupus in 2014 in [place]. Was on prednisone in past. Now on plaquenil and azathioprine. Has rheumatologist that prescribes these meds. Said predominantly affected lungs. Cardiology notes reviewed and indicate had pericarditis as well. Has ~20 pack year smoking history. TTE 06/2019 reviewed with LA enlargement, EF 45%,  Moderate mitral regurgitation, mild AI.  Past Medical History  CHF w/ reduced EF, mitral regurg, CAD, Afib, SLE  Significant Hospital Events   Admitted 11/23 thora 11/23  Consults:  PCCM VIR  Procedures:  Inocencio Homes 11/24  Significant Diagnostic Tests:  CT chest 11/23 - R>L bilateral pleural effusions, stable nodules largest 2 in LUL, pulmonary edema, bullous emphysematous changes scattered throughout  Pleural fluid transudate 11/24  Micro Data:  Pleural fluid culture pending  Antimicrobials:  n/a  Interim history/subjective:   Doing well this morning. No complaints. Planned bronch this morning .   Objective   Blood pressure  96/70, pulse (!) 58, temperature 98 F (36.7 C), temperature source Oral, resp. rate 16, height 6' (1.829 m), weight 61.9 kg, SpO2 94 %.        Intake/Output Summary (Last 24 hours) at 04/29/2020 0914 Last data filed at 04/29/2020 0300 Gross per 24 hour  Intake 303.93 ml  Output 950 ml  Net -646.07 ml   Filed Weights   04/26/20 1317 04/28/20 1154 04/29/20 0600  Weight: 66.7 kg 62.5 kg 61.9 kg    Examination: General: male, resting in bed, no distress Eyes: tracking  Neck: no jvp  Lungs: ctab, no wheeze  Cardiovascular: RRR, systolic murmur Abdomen: soft, nt nd  Extremities: no edema  Neuro: alert, follows commands  Psych: normal mood and affect    Resolved Hospital Problem list   n/a  Assessment & Plan:   Multiple pulmonary nodules In various fleeting stages, overall similar to previous imaging  He is immunocompromised which may be playing a role in his nodules if they are infectious  Pleural effusions, I suspect are related to his heart failure  Acute hypoxemic respiratory failure is related to above   Plan:  Bronch with BAL and cultures for AFB/fungus/bacteria planned today  I suspect diagnostic yield will be low but if something comes up then we can treat it  Otherwise, would plan for outpatient follow up regarding his nodules and treatment for his heart failure.  Also, need routine follow up with his rheumatologist for his immunosuppression   We will see him in clinic in 6 weeks or so. The lab will hold his cultures for several weeks to look for any  growth. I explained this to the patient and his wife and told them that they could receive a call from me if anything comes back positive.   Pulmonary will not plan on seeing this weekend. I suspect he will be close to discharge.  Please call us with any questions.    Best practice (evaluated daily)   Per primary  Labs   CBC: Recent Labs  Lab 04/26/20 1319 04/27/20 0337 04/28/20 0300 04/29/20 0336  WBC  4.1 3.7* 3.8* 3.1*  HGB 11.9* 11.2* 12.1* 11.4*  HCT 40.1 36.5* 39.7 36.9*  MCV 89.9 88.0 87.6 88.5  PLT 187 192 170 676    Basic Metabolic Panel: Recent Labs  Lab 04/26/20 1319 04/27/20 0337 04/28/20 1200  NA 141 140 139  K 3.7 3.0* 4.0  CL 101 102 102  CO2 _0 GLUCOSE 96 85 130*  BUN _1 CREATININE 1.45* 1.37* 1.60*  CALCIUM 9.0 8.5* 8.6*  MG  --  1.9 2.2  PHOS  --  4.1  --    GFR: Estimated Creatinine Clearance: 35.5 mL/min (A) (by C-G formula based on SCr of 1.6 mg/dL (H)). Recent Labs  Lab 04/26/20 1319 04/27/20 0337 04/28/20 0300 04/29/20 0336  WBC 4.1 3.7* 3.8* 3.1*    Liver Function Tests: Recent Labs  Lab 04/27/20 0337 04/27/20 1125  AST 21  --   ALT 19  --   ALKPHOS 48  --   BILITOT 1.1  --   PROT 6.1*  --   ALBUMIN 2.7* 3.0*   No results for input(s): LIPASE, AMYLASE in the last 168 hours. No results for input(s): AMMONIA in the last 168 hours.  ABG    Component Value Date/Time   PHART 7.371 10/18/2019 0224   PCO2ART 35.4 10/18/2019 0224   PO2ART 113 (H) 10/18/2019 0224   HCO3 20.5 10/18/2019 0224   TCO2 23 11/12/2019 1730   ACIDBASEDEF 4.0 (H) 10/18/2019 0224   O2SAT 59.4 10/26/2019 0700     Coagulation Profile: Recent Labs  Lab 04/27/20 0337  INR 2.1*    Cardiac Enzymes: No results for input(s): CKTOTAL, CKMB, CKMBINDEX, TROPONINI in the last 168 hours.  HbA1C: Hgb A1c MFr Bld  Date/Time Value Ref Range Status  10/17/2019 12:05 PM 5.7 (H) 4.8 - 5.6 % Final    Comment:    (NOTE) Pre diabetes:          5.7%-6.4% Diabetes:              >6.4% Glycemic control for   <7.0% adults with diabetes     CBG: No results for input(s): GLUCAP in the last 168 hours.  Review of Systems:   No neurologic changes. No headache. No night sweats. Comprehensive review of systems otherwise negative.   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   Surgical History    Past Surgical History:  Procedure Laterality Date  . CARDIAC CATHETERIZATION    . CLIPPING OF ATRIAL APPENDAGE N/A 10/17/2019   Procedure: Clipping Of Atrial Appendage using AtriCure [id number] 45 MM AtriClip.;  Surgeon: Rexene Alberts, MD;  Location: [facility];  Service: Open Heart Surgery;  Laterality: N/A;  . CORONARY ARTERY BYPASS  GRAFT N/A 10/17/2019   Procedure: CORONARY ARTERY BYPASS GRAFTING (CABG) using LIMA to LAD; Endoscopic harvest right greater saphenous vein: SVG to RAMUS.;  Surgeon: Rexene Alberts, MD;  Location: [facility];  Service: Open Heart Surgery;  Laterality: N/A;  . CORONARY BALLOON ANGIOPLASTY N/A 10/17/2019   Procedure: CORONARY BALLOON ANGIOPLASTY;  Surgeon: Nelva Bush, MD;  Location: [facility];  Service: Cardiovascular;  Laterality: N/A;  . coronary stents  2009  . CORONARY/GRAFT ACUTE MI REVASCULARIZATION N/A 10/17/2019   Procedure: Coronary/Graft Acute MI Revascularization;  Surgeon: Nelva Bush, MD;  Location: [facility];  Service: Cardiovascular;  Laterality: N/A;  . ENDOVEIN HARVEST OF GREATER SAPHENOUS VEIN Right 10/17/2019   Procedure: [place] Of Greater Saphenous Vein;  Surgeon: Rexene Alberts, MD;  Location: [facility];  Service: Open Heart Surgery;  Laterality: Right;  . IABP INSERTION N/A 10/17/2019   Procedure: IABP Insertion;  Surgeon: Nelva Bush, MD;  Location: [facility];  Service: Cardiovascular;  Laterality: N/A;  . INCISION AND DRAINAGE ABSCESS N/A 01/29/2020   Procedure: INCISION AND DRAINAGE BILATERAL PERIRECTAL ABSCESS;  Surgeon: Stark Klein, MD;  Location: [facility];  Service: General;  Laterality: N/A;  .  IR THORACENTESIS ASP PLEURAL SPACE W/IMG GUIDE  10/26/2019  . IR THORACENTESIS ASP PLEURAL SPACE W/IMG GUIDE  04/27/2020  . RIGHT/LEFT HEART CATH AND CORONARY ANGIOGRAPHY N/A 10/17/2019   Procedure: RIGHT/LEFT HEART CATH AND CORONARY ANGIOGRAPHY;  Surgeon: Nelva Bush, MD;  Location: [facility];  Service: Cardiovascular;  Laterality: N/A;  . TEE WITHOUT CARDIOVERSION  10/17/2019   Procedure: Transesophageal Echocardiogram (Tee);  Surgeon: Rexene Alberts, MD;  Location: [facility];  Service: Open Heart Surgery;;     Social History   reports that he quit smoking about 27 years ago. His smoking use included cigarettes. He has a 20.00 pack-year smoking history. He has never used smokeless tobacco. He reports that he does not drink alcohol and does not use drugs.   Family History   His family history includes Alcohol abuse in his father; Cancer in his brother; Diabetes in his paternal uncle; Heart disease in his mother; Hyperlipidemia in his brother and sister; Hypertension in his brother and sister; Stomach cancer in his brother. There is no history of Colon cancer, Esophageal cancer, or Rectal cancer.   Allergies No Known Allergies   Home Medications  Prior to Admission medications   Medication Sig Start Date End Date Taking? Authorizing Provider  apixaban (ELIQUIS) 5 MG TABS tablet Take 1 tablet (5 mg total) by mouth 2 (two) times daily. 04/01/20 05/01/20 Yes Pahwani, Einar Grad, MD  aspirin EC 81 MG tablet Take 1 tablet (81 mg total) by mouth daily. 08/10/14  Yes Nahser, Wonda Cheng, MD  azaTHIOprine (IMURAN) 50 MG tablet Take 100 mg by mouth 2 (two) times daily.  10/11/14  Yes [provider]  furosemide (LASIX) 40 MG tablet Please take 40 mg oral daily, and take extra 40 mg of Lasix if you gain 3 pounds have worsening lower extremity edema for 1 day. 04/07/20  Yes Elgergawy, Silver Huguenin, MD  hydroxychloroquine (PLAQUENIL) 200 MG tablet Take 400 mg by mouth at bedtime.    Yes [provider]  oxyCODONE (OXY IR/ROXICODONE) 5 MG immediate release tablet Take 1 tablet (5 mg total) by mouth every 6 (six) hours as needed for severe pain. 02/02/20  Yes Rai, Ripudeep K, MD  polyethylene glycol (MIRALAX / GLYCOLAX) 17 g packet Take 17 g  by mouth daily as needed for moderate constipation.    Yes [provider]  polyvinyl alcohol (ARTIFICIAL TEARS) 1.4 % ophthalmic solution Place 2 drops into both eyes daily.    Yes [provider]  rosuvastatin (CRESTOR) 20 MG tablet Take 20 mg by mouth daily.   Yes [provider]  tamsulosin (FLOMAX) 0.4 MG CAPS capsule Take 0.4 mg by mouth every evening.  04/02/17  Yes [provider]     Garner Nash, DO [facility] 04/29/2020 11:03 AM

## 2020-04-29 NOTE — Anesthesia Preprocedure Evaluation (Addendum)
Anesthesia Evaluation  Patient identified by MRN, date of birth, ID band Patient awake    Reviewed: Allergy & Precautions, H&P , NPO status , Patient's Chart, lab work & pertinent test results  Airway Mallampati: II   Neck ROM: full    Dental   Pulmonary shortness of breath, former smoker,  Pulmonary nodules   breath sounds clear to auscultation       Cardiovascular hypertension, + CAD, + Past MI, + Cardiac Stents, + CABG and +CHF  + dysrhythmias  Rhythm:regular Rate:Normal  TTE (04/28/2020): EF 30%, severe MR, moderate TR, mild AI. Grade 3 diastolic dysfunction.   Neuro/Psych  Headaches, PSYCHIATRIC DISORDERS Anxiety Depression    GI/Hepatic   Endo/Other    Renal/GU Renal InsufficiencyRenal disease     Musculoskeletal   Abdominal   Peds  Hematology   Anesthesia Other Findings   Reproductive/Obstetrics                            Anesthesia Physical Anesthesia Plan  ASA: III  Anesthesia Plan: General   Post-op Pain Management:    Induction: Intravenous  PONV Risk Score and Plan: 2 and Ondansetron, Dexamethasone and Treatment may vary due to age or medical condition  Airway Management Planned: Oral ETT  Additional Equipment:   Intra-op Plan:   Post-operative Plan: Extubation in OR  Informed Consent: I have reviewed the patients History and Physical, chart, labs and discussed the procedure including the risks, benefits and alternatives for the proposed anesthesia with the patient or authorized representative who has indicated his/her understanding and acceptance.       Plan Discussed with: CRNA, Anesthesiologist and Surgeon  Anesthesia Plan Comments:         Anesthesia Quick Evaluation

## 2020-04-29 NOTE — Progress Notes (Addendum)
Progress Note  Patient Name: Cody Arellano Date of Encounter: 04/29/2020  [facility] Cardiologist: Mertie Moores, MD   Subjective   Breathing ok, does not require O2 No chest pain SBP generally on the low side  Inpatient Medications    Scheduled Meds: . aspirin EC  81 mg Oral Daily  . azaTHIOprine  100 mg Oral BID  . feeding supplement  237 mL Oral TID BM  . hydroxychloroquine  400 mg Oral QHS  . rosuvastatin  20 mg Oral Daily  . tamsulosin  0.4 mg Oral QPM   Continuous Infusions: . heparin 950 Units/hr (04/29/20 1415)  . lactated ringers Stopped (04/29/20 1350)   PRN Meds: lidocaine, ondansetron (ZOFRAN) IV, oxyCODONE, polyethylene glycol   Vital Signs    Vitals:   04/29/20 1320 04/29/20 1330 04/29/20 1354 04/29/20 1400  BP: 104/70 106/68 115/78 116/72  Pulse: 63 62    Resp: (!) _0 (!) 21  Temp:      TempSrc:      SpO2: 92% 96%    Weight:      Height:        Intake/Output Summary (Last 24 hours) at 04/29/2020 1513 Last data filed at 04/29/2020 1229 Gross per 24 hour  Intake 379.31 ml  Output 1100 ml  Net -720.69 ml   Last 3 Weights 04/29/2020 04/29/2020 04/28/2020  Weight (lbs) 136 lb 7.4 oz 136 lb 8 oz 137 lb 12.8 oz  Weight (kg) 61.9 kg 61.916 kg 62.506 kg      Telemetry    SR, SB 50s - Personally Reviewed  ECG    None today - Personally Reviewed  Physical Exam   GEN: No acute distress.   Neck: JVD 10 cm Cardiac: RRR, +systyolic murmur, no rubs, or gallops.  Respiratory: decreased BS R, rales L base  GI: Soft, nontender, non-distended  MS: No edema; No deformity. Neuro:  Nonfocal  Psych: Normal affect   Labs    High Sensitivity Troponin:   Recent Labs  Lab 04/06/20 2134 04/06/20 2341  TROPONINIHS 28* 25*      Chemistry Recent Labs  Lab 04/27/20 0337 04/27/20 1125 04/28/20 1200 04/29/20 0851  NA 140  --  139 139  K 3.0*  --  4.0 3.7  CL 102  --  102 102  CO2 27  --  25 25  GLUCOSE 85  --  130* 68*  BUN 17   --  21 20  CREATININE 1.37*  --  1.60* 1.47*  CALCIUM 8.5*  --  8.6* 8.4*  PROT 6.1*  --   --   --   ALBUMIN 2.7* 3.0*  --   --   AST 21  --   --   --   ALT 19  --   --   --   ALKPHOS 48  --   --   --   BILITOT 1.1  --   --   --   GFRNONAA 54*  --  45* 50*  ANIONGAP 11  --  12 12    Magnesium  Date Value Ref Range Status  04/28/2020 2.2 1.7 - 2.4 mg/dL Final    Comment:    Performed at [facility], [place] [address], [place], [place] [address]    Hematology Recent Labs  Lab 04/27/20 [id number] 04/28/20 0300 04/29/20 0336  WBC 3.7* 3.8* 3.1*  RBC 4.15* 4.53 4.17*  HGB 11.2* 12.1* 11.4*  HCT 36.5* 39.7 36.9*  MCV 88.0  87.6 88.5  MCH 27.0 26.7 27.3  MCHC 30.7 30.5 30.9  RDW 18.0* 18.0* 17.7*  PLT 192 170 165    BNP Recent Labs  Lab 04/26/20 1319  BNP 1,824.4*    ANCA titer pending Ds DNA AB 1 (<5) C4 complement 15 (12-38) C3 complement 85 (82-167)   Radiology   CHEST CT: 04/26/2020  IMPRESSION: 1. Again noted is poor opacification of the bilateral lower lobe pulmonary arteries. Differential considerations include underlying pulmonary emboli as before versus suboptimal opacification secondary to contrast bolus timing. 2. Lungs/Pleura: There is a large right-sided pleural effusion. Emphysematous changes are noted bilaterally. There is a 1.1 cm pulmonary nodule in the left upper lobe which has increased in size from the prior study (axial series 6, image 27). There is a ground-glass airspace opacity in the left upper lobe measuring approximately 2.1 cm which has increased in size from the prior study when it measured approximately 1.3 cm (axial series 6, image 55) parent there is diffuse bronchial wall thickening and mucus plugging bilaterally. There is atelectasis versus consolidation in the right lower lobe. There is a 9 mm pulmonary nodule in the right middle lobe, increased in size from prior study (axial series 6, image 86). There is a small left-sided  pleural effusion.  3. Cardiomegaly with reflux of contrast in the IVC consistent with underlying cardiac dysfunction. 4. Bilateral bronchial wall thickening and mucus plugging consistent with infectious or reactive bronchiolitis. 5. Growing bilateral pulmonary nodules. While these may be infectious or inflammatory in etiology, malignancy is not excluded. Pulmonary medicine consultation is recommended. 6. Small volume ascites in the upper abdomen.  DG CHEST PORT 1 VIEW  Result Date: 04/27/2020 CLINICAL DATA:  Follow-up right thoracentesis EXAM: PORTABLE CHEST 1 VIEW COMPARISON:  Earlier same day FINDINGS: Cardiomegaly. Mediastinal surgical clip. Atrial clip. Aortic atherosclerotic calcification. Chronic interstitial lung markings. No evidence of pneumothorax or visible pleural fluid. Question development of pulmonary venous hypertension since the previous exam. IMPRESSION: No pneumothorax following thoracentesis. Question development of pulmonary venous hypertension. Electronically Signed   By: Nelson Chimes M.D.   On: 04/27/2020 16:46   ECHOCARDIOGRAM LIMITED  Result Date: 04/28/2020    ECHOCARDIOGRAM LIMITED REPORT   [name] Shear Date of Exam: 04/28/2020 Medical Rec #:  [id number]    Height:       72.0 in Accession #:    [id number]   Weight:       137.8 lb Date of Birth:  [birth year]    BSA:          1.819 m Patient Age:    74 years     BP:           100/77 mmHg Patient Gender: M            HR:           66 bpm. Exam Location:  Inpatient Procedure: Limited Echo, Limited Color Doppler, Cardiac Doppler and Intracardiac            Opacification Agent Indications:    Dyspnea 786.09 / R06.00  History:        Patient has prior history of Echocardiogram examinations, most                 recent 03/23/2020. CHF, CAD, Prior CABG, Arrythmias:Atrial                 Fibrillation; Risk Factors:Hypertension. Lupus.  Sonographer:    Darlina Sicilian RDCS Referring Phys:  [place] Left ventricular ejection fraction, by estimation, is 30 to 35%. The left ventricle has moderately decreased function. The left ventricle demonstrates global hypokinesis. The left ventricular internal cavity size was mildly dilated. Left ventricular diastolic parameters are consistent with Grade III diastolic dysfunction (restrictive).  2. Right ventricular systolic function is mildly reduced. The right ventricular size is moderately enlarged. There is mildly elevated pulmonary artery systolic pressure. The estimated right ventricular systolic pressure is 34.2 mmHg.  3. Left atrial size was moderately dilated.  4. Right atrial size was severely dilated.  5. The mitral valve is degenerative. Severe mitral valve regurgitation. Moderate mitral annular calcification.  6. Tricuspid valve regurgitation is moderate.  7. The aortic valve is tricuspid. Aortic valve regurgitation is mild. Mild aortic valve sclerosis is present, with no evidence of aortic valve stenosis. Comparison(s): EF is reduced. Mitral valve regurgitation is now severe. FINDINGS  Left Ventricle: Left ventricular ejection fraction, by estimation, is 30 to 35%. The left ventricle has moderately decreased function. The left ventricle demonstrates global hypokinesis. Definity contrast agent was given IV to delineate the left ventricular endocardial borders. The left ventricular internal cavity size was mildly dilated. Left ventricular diastolic parameters are consistent with Grade III diastolic dysfunction (restrictive). The ratio of pulmonic flow to systemic flow (Qp/Qs ratio) is 0.70.  LV Wall Scoring: The mid and distal anterior wall, entire apex, mid anterolateral segment, and mid inferoseptal segment are akinetic. The mid inferolateral segment and mid inferior segment are hypokinetic. Right Ventricle: The right ventricular size is moderately enlarged. Right ventricular systolic function is mildly reduced. There is mildly elevated pulmonary artery  systolic pressure. The tricuspid regurgitant velocity is 2.55 m/s, and with an assumed right atrial pressure of 15 mmHg, the estimated right ventricular systolic pressure is 87.6 mmHg. Left Atrium: Left atrial size was moderately dilated. Right Atrium: Right atrial size was severely dilated. Mitral Valve: The mitral valve is degenerative in appearance. Moderate mitral annular calcification. Severe mitral valve regurgitation. Tricuspid Valve: Tricuspid valve regurgitation is moderate. Aortic Valve: The aortic valve is tricuspid. Aortic valve regurgitation is mild. Mild aortic valve sclerosis is present, with no evidence of aortic valve stenosis. IAS/Shunts: The ratio of pulmonic flow to systemic flow (Qp/Qs ratio) is 0.70. LEFT VENTRICLE PLAX 2D LVIDd:         5.70 cm      Diastology LVIDs:         4.40 cm      LV e' medial:    5.56 cm/s LV PW:         0.90 cm      LV E/e' medial:  17.2 LV IVS:        1.00 cm      LV e' lateral:   8.72 cm/s LVOT diam:     1.90 cm      LV E/e' lateral: 10.9 LV SV:         22 LV SV Index:   12 LVOT Area:     2.84 cm  LV Volumes (MOD) LV vol d, MOD A2C: 260.0 ml LV vol d, MOD A4C: 186.0 ml LV vol s, MOD A2C: 157.0 ml LV vol s, MOD A4C: 110.0 ml LV SV MOD A2C:     103.0 ml LV SV MOD A4C:     186.0 ml LV SV MOD BP:      84.8 ml RIGHT VENTRICLE RVOT diam:      2.10 cm LEFT ATRIUM  Index LA diam:    4.20 cm 2.31 cm/m  AORTIC VALVE             PULMONIC VALVE LVOT Vmax:   50.90 cm/s  RVOT Peak grad: 1 mmHg LVOT Vmean:  37.900 cm/s LVOT VTI:    0.079 m  AORTA Ao Asc diam: 3.40 cm MITRAL VALVE                 TRICUSPID VALVE MV Area (PHT): 5.60 cm      TR Peak grad:   26.0 mmHg MV Decel Time: 136 msec      TR Vmax:        255.00 cm/s MR Peak grad:    59.6 mmHg MR Mean grad:    41.0 mmHg   SHUNTS MR Vmax:         386.00 cm/s Systemic VTI:  0.08 m MR Vmean:        304.0 cm/s  Systemic Diam: 1.90 cm MR PISA:         4.02 cm    Pulmonic VTI:  0.046 m MR PISA Eff ROA: 39 mm      Pulmonic  Diam: 2.10 cm MR PISA Radius:  0.80 cm     Qp/Qs:         0.71 MV E velocity: 95.30 cm/s Candee Furbish MD Electronically signed by Candee Furbish MD Signature Date/Time: 04/28/2020/4:17:00 PM    Final     Cardiac Studies   ECHO: 04/28/2020 1. Left ventricular ejection fraction, by estimation, is 30 to 35%. The left ventricle has moderately decreased function. The left ventricle demonstrates global hypokinesis. The left ventricular internal cavity size was mildly dilated. Left ventricular diastolic parameters are consistent with Grade III diastolic dysfunction (restrictive).  2. Right ventricular systolic function is mildly reduced. The right  ventricular size is moderately enlarged. There is mildly elevated  pulmonary artery systolic pressure. The estimated right ventricular  systolic pressure is 53.6 mmHg.  3. Left atrial size was moderately dilated.  4. Right atrial size was severely dilated.  5. The mitral valve is degenerative. Severe mitral valve regurgitation.  Moderate mitral annular calcification.  6. Tricuspid valve regurgitation is moderate.  7. The aortic valve is tricuspid. Aortic valve regurgitation is mild.  Mild aortic valve sclerosis is present, with no evidence of aortic valve  stenosis.   Comparison(s): EF is reduced. Mitral valve regurgitation is now severe.   Patient Profile     74 y.o. male with a hx of ischemic cardiomyopathy recent CABG in May, HFrEF, PAF on Eliquis, recent PE, lupus w/ ILD and pleural effusion who was admitted 11/23 with unintentional wt loss 20 lbs, lg R pleural effusion, edema.  Assessment & Plan    Acute on chronic systolic and diastolic CHF EF 64-40% With Lupus, Query of interstitial lung disease, a R sided pleural effusion s/p BAL 04/29/20 Severe MR Severe TR  AKI on CKD Stage III - s/p diuresis with 5 kg weight loss. - on no GDMT, predominantly in the setting of hypotension - Will send BNP for 04/29/20; AKI is improving and may be  able to return  - low threshold for afterload reduction (hydralazine 10 mg TID if patient able to tolerate) - given present HR, likely unable to tolerate BB - inability to tolerate GDMT would be a poor prognostic sign  lg R pleural effusion - s/p tap w/ 1.1 L out - f/u CXR w/ no PTX  Pulm nodules - enlarging, see CT report  above - s/p bronch  CAD - no ischemic sx - on ASA, statin - HR 50s at times and SBP 100s, no BB  PAF - on eliquis at home - would return when OK per pulm/primary - no BB as above - presently in sinus  PE -AC as above  Otherwise, per IM Principal Problem:   Pleural effusion on right Active Problems:   CAD (coronary artery disease)   Hyperlipemia   Paroxysmal atrial fibrillation (HCC)   Heart failure, systolic, chronic (HCC)   Pulmonary embolism (HCC)   Acute on chronic systolic CHF (congestive heart failure) (HCC)   Shortness of breath   Elevated brain natriuretic peptide (BNP) level   AKI (acute kidney injury) ([place])   Pulmonary nodules   Nausea & vomiting   For questions or updates, please contact [facility] Please consult [email or web address] for contact info under   Seen originally by Rosaria Ferries PA 9:34.    Personally seen and examined. Agree with APP above with edits made to the above. 74 yo M with prior CABG, CAD, with worsening HFrEF and MR in the setting of lung disease, ILD, and kidney issues.  Working to restart GDMT and diuretics.  Signed, Werner Lean, MD  04/29/2020, 3:13 PM

## 2020-04-29 NOTE — Transfer of Care (Signed)
Immediate Anesthesia Transfer of Care Note  Patient: Cody Arellano  Procedure(s) Performed: VIDEO BRONCHOSCOPY WITHOUT FLUORO (N/A ) BRONCHIAL WASHINGS  Patient Location: Endoscopy Unit  Anesthesia Type:General  Level of Consciousness: awake, alert  and oriented  Airway & Oxygen Therapy: Patient Spontanous Breathing and Patient connected to face mask oxygen  Post-op Assessment: Report given to RN and Post -op Vital signs reviewed and stable  Post vital signs: Reviewed and stable  Last Vitals:  Vitals Value Taken Time  BP    Temp    Pulse 71 04/29/20 1245  Resp 0 04/29/20 1245  SpO2 100 % 04/29/20 1245  Vitals shown include unvalidated device data.  Last Pain:  Vitals:   04/29/20 1126  TempSrc: Oral  PainSc:          Complications: No complications documented.

## 2020-04-29 NOTE — H&P (View-Only) (Signed)
NAME:  Cody Arellano, MRN:  [id number], DOB:  Apr 13, 1946, LOS: 3 ADMISSION DATE:  04/26/2020, CONSULTATION DATE:  04/29/20 REFERRING MD:  Geradine Girt, DO, CHIEF COMPLAINT:  DOE   Brief History   74 year old with ischemic cardiomyopathy EF 45%, SLE on imuran and plaquenil whom we are consulted for evaluation of pulmonary nodules.   History of present illness   Notes DOE, SOB at rest since heart surgery many months ago. Breathing worsens at times. Admitted for volume overload in last couple of months. Felt similar. Came to ED. BNP at highest level. CT with bilateral R>L pleural effusions as well as nodules discussed below. Admitted. Received IV lasix. Had thora this morning. Initial studies consistent with transudate, lymph predominant. Feels breathing is improved post procedure.  Notes 20 or so pound weight loss over several months. Has had no appetite in months. No fever or chills. No significant cough. No night sweats.   Diagnosed with lupus in 2014 in [place]. Was on prednisone in past. Now on plaquenil and azathioprine. Has rheumatologist that prescribes these meds. Said predominantly affected lungs. Cardiology notes reviewed and indicate had pericarditis as well. Has ~20 pack year smoking history. TTE 06/2019 reviewed with LA enlargement, EF 45%,  Moderate mitral regurgitation, mild AI.  Past Medical History  CHF w/ reduced EF, mitral regurg, CAD, Afib, SLE  Significant Hospital Events   Admitted 11/23 thora 11/23  Consults:  PCCM VIR  Procedures:  Inocencio Homes 11/24  Significant Diagnostic Tests:  CT chest 11/23 - R>L bilateral pleural effusions, stable nodules largest 2 in LUL, pulmonary edema, bullous emphysematous changes scattered throughout  Pleural fluid transudate 11/24  Micro Data:  Pleural fluid culture pending  Antimicrobials:  n/a  Interim history/subjective:   Doing well this morning. No complaints. Planned bronch this morning .   Objective   Blood pressure  96/70, pulse (!) 58, temperature 98 F (36.7 C), temperature source Oral, resp. rate 16, height 6' (1.829 m), weight 61.9 kg, SpO2 94 %.        Intake/Output Summary (Last 24 hours) at 04/29/2020 0914 Last data filed at 04/29/2020 0300 Gross per 24 hour  Intake 303.93 ml  Output 950 ml  Net -646.07 ml   Filed Weights   04/26/20 1317 04/28/20 1154 04/29/20 0600  Weight: 66.7 kg 62.5 kg 61.9 kg    Examination: General: male, resting in bed, no distress Eyes: tracking  Neck: no jvp  Lungs: ctab, no wheeze  Cardiovascular: RRR, systolic murmur Abdomen: soft, nt nd  Extremities: no edema  Neuro: alert, follows commands  Psych: normal mood and affect    Resolved Hospital Problem list   n/a  Assessment & Plan:   Multiple pulmonary nodules In various fleeting stages, overall similar to previous imaging  He is immunocompromised which may be playing a role in his nodules if they are infectious  Pleural effusions, I suspect are related to his heart failure  Acute hypoxemic respiratory failure is related to above   Plan:  Bronch with BAL and cultures for AFB/fungus/bacteria planned today  I suspect diagnostic yield will be low but if something comes up then we can treat it  Otherwise, would plan for outpatient follow up regarding his nodules and treatment for his heart failure.  Also, need routine follow up with his rheumatologist for his immunosuppression   We will see him in clinic in 6 weeks or so. The lab will hold his cultures for several weeks to look for any  growth. I explained this to the patient and his wife and told them that they could receive a call from me if anything comes back positive.   Pulmonary will not plan on seeing this weekend. I suspect he will be close to discharge.  Please call us with any questions.    Best practice (evaluated daily)   Per primary  Labs   CBC: Recent Labs  Lab 04/26/20 1319 04/27/20 0337 04/28/20 0300 04/29/20 0336  WBC  4.1 3.7* 3.8* 3.1*  HGB 11.9* 11.2* 12.1* 11.4*  HCT 40.1 36.5* 39.7 36.9*  MCV 89.9 88.0 87.6 88.5  PLT 187 192 170 579    Basic Metabolic Panel: Recent Labs  Lab 04/26/20 1319 04/27/20 0337 04/28/20 1200  NA 141 140 139  K 3.7 3.0* 4.0  CL 101 102 102  CO2 _0 GLUCOSE 96 85 130*  BUN _1 CREATININE 1.45* 1.37* 1.60*  CALCIUM 9.0 8.5* 8.6*  MG  --  1.9 2.2  PHOS  --  4.1  --    GFR: Estimated Creatinine Clearance: 35.5 mL/min (A) (by C-G formula based on SCr of 1.6 mg/dL (H)). Recent Labs  Lab 04/26/20 1319 04/27/20 0337 04/28/20 0300 04/29/20 0336  WBC 4.1 3.7* 3.8* 3.1*    Liver Function Tests: Recent Labs  Lab 04/27/20 0337 04/27/20 1125  AST 21  --   ALT 19  --   ALKPHOS 48  --   BILITOT 1.1  --   PROT 6.1*  --   ALBUMIN 2.7* 3.0*   No results for input(s): LIPASE, AMYLASE in the last 168 hours. No results for input(s): AMMONIA in the last 168 hours.  ABG    Component Value Date/Time   PHART 7.371 10/18/2019 0224   PCO2ART 35.4 10/18/2019 0224   PO2ART 113 (H) 10/18/2019 0224   HCO3 20.5 10/18/2019 0224   TCO2 23 11/12/2019 1730   ACIDBASEDEF 4.0 (H) 10/18/2019 0224   O2SAT 59.4 10/26/2019 0700     Coagulation Profile: Recent Labs  Lab 04/27/20 0337  INR 2.1*    Cardiac Enzymes: No results for input(s): CKTOTAL, CKMB, CKMBINDEX, TROPONINI in the last 168 hours.  HbA1C: Hgb A1c MFr Bld  Date/Time Value Ref Range Status  10/17/2019 12:05 PM 5.7 (H) 4.8 - 5.6 % Final    Comment:    (NOTE) Pre diabetes:          5.7%-6.4% Diabetes:              >6.4% Glycemic control for   <7.0% adults with diabetes     CBG: No results for input(s): GLUCAP in the last 168 hours.  Review of Systems:   No neurologic changes. No headache. No night sweats. Comprehensive review of systems otherwise negative.   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   Surgical History    Past Surgical History:  Procedure Laterality Date   CARDIAC CATHETERIZATION     CLIPPING OF ATRIAL APPENDAGE N/A 10/17/2019   Procedure: Clipping Of Atrial Appendage using AtriCure [id number] 45 MM AtriClip.;  Surgeon: Rexene Alberts, MD;  Location: [facility];  Service: Open Heart Surgery;  Laterality: N/A;   CORONARY ARTERY BYPASS  GRAFT N/A 10/17/2019   Procedure: CORONARY ARTERY BYPASS GRAFTING (CABG) using LIMA to LAD; Endoscopic harvest right greater saphenous vein: SVG to RAMUS.;  Surgeon: Rexene Alberts, MD;  Location: [facility];  Service: Open Heart Surgery;  Laterality: N/A;   CORONARY BALLOON ANGIOPLASTY N/A 10/17/2019   Procedure: CORONARY BALLOON ANGIOPLASTY;  Surgeon: Nelva Bush, MD;  Location: [facility];  Service: Cardiovascular;  Laterality: N/A;   coronary stents  2009   CORONARY/GRAFT ACUTE MI REVASCULARIZATION N/A 10/17/2019   Procedure: Coronary/Graft Acute MI Revascularization;  Surgeon: Nelva Bush, MD;  Location: [facility];  Service: Cardiovascular;  Laterality: N/A;   ENDOVEIN HARVEST OF GREATER SAPHENOUS VEIN Right 10/17/2019   Procedure: [place] Of Greater Saphenous Vein;  Surgeon: Rexene Alberts, MD;  Location: [facility];  Service: Open Heart Surgery;  Laterality: Right;   IABP INSERTION N/A 10/17/2019   Procedure: IABP Insertion;  Surgeon: Nelva Bush, MD;  Location: [facility];  Service: Cardiovascular;  Laterality: N/A;   INCISION AND DRAINAGE ABSCESS N/A 01/29/2020   Procedure: INCISION AND DRAINAGE BILATERAL PERIRECTAL ABSCESS;  Surgeon: Stark Klein, MD;  Location: [facility];  Service: General;  Laterality: N/A;    IR THORACENTESIS ASP PLEURAL SPACE W/IMG GUIDE  10/26/2019   IR THORACENTESIS ASP PLEURAL SPACE W/IMG GUIDE  04/27/2020   RIGHT/LEFT HEART CATH AND CORONARY ANGIOGRAPHY N/A 10/17/2019   Procedure: RIGHT/LEFT HEART CATH AND CORONARY ANGIOGRAPHY;  Surgeon: Nelva Bush, MD;  Location: [facility];  Service: Cardiovascular;  Laterality: N/A;   TEE WITHOUT CARDIOVERSION  10/17/2019   Procedure: Transesophageal Echocardiogram ([place]);  Surgeon: Rexene Alberts, MD;  Location: [facility];  Service: Open Heart Surgery;;     Social History   reports that he quit smoking about 27 years ago. His smoking use included cigarettes. He has a 20.00 pack-year smoking history. He has never used smokeless tobacco. He reports that he does not drink alcohol and does not use drugs.   Family History   His family history includes Alcohol abuse in his father; Cancer in his brother; Diabetes in his paternal uncle; Heart disease in his mother; Hyperlipidemia in his brother and sister; Hypertension in his brother and sister; Stomach cancer in his brother. There is no history of Colon cancer, Esophageal cancer, or Rectal cancer.   Allergies No Known Allergies   Home Medications  Prior to Admission medications   Medication Sig Start Date End Date Taking? Authorizing Provider  apixaban (ELIQUIS) 5 MG TABS tablet Take 1 tablet (5 mg total) by mouth 2 (two) times daily. 04/01/20 05/01/20 Yes Pahwani, Einar Grad, MD  aspirin EC 81 MG tablet Take 1 tablet (81 mg total) by mouth daily. 08/10/14  Yes Nahser, Wonda Cheng, MD  azaTHIOprine (IMURAN) 50 MG tablet Take 100 mg by mouth 2 (two) times daily.  10/11/14  Yes [provider]  furosemide (LASIX) 40 MG tablet Please take 40 mg oral daily, and take extra 40 mg of Lasix if you gain 3 pounds have worsening lower extremity edema for 1 day. 04/07/20  Yes Elgergawy, Silver Huguenin, MD  hydroxychloroquine (PLAQUENIL) 200 MG tablet Take 400 mg by mouth at bedtime.    Yes [provider]  oxyCODONE (OXY IR/ROXICODONE) 5 MG immediate release tablet Take 1 tablet (5 mg total) by mouth every 6 (six) hours as needed for severe pain. 02/02/20  Yes Rai, Ripudeep K, MD  polyethylene glycol (MIRALAX / GLYCOLAX) 17 g packet Take 17 g  by mouth daily as needed for moderate constipation.    Yes [provider]  polyvinyl alcohol (ARTIFICIAL TEARS) 1.4 % ophthalmic solution Place 2 drops into both eyes daily.    Yes [provider]  rosuvastatin (CRESTOR) 20 MG tablet Take 20 mg by mouth daily.   Yes [provider]  tamsulosin (FLOMAX) 0.4 MG CAPS capsule Take 0.4 mg by mouth every evening.  04/02/17  Yes [provider]     Garner Nash, DO [facility] 04/29/2020 11:03 AM

## 2020-04-29 NOTE — Progress Notes (Signed)
Pt prepared for Bronchoscopy today. Pt is on schedule at 11:45am. Night nurse has kept pt NPO to prvent any delays. No order are in computer for pre-procedure. Eulogio Bear, MD at the bedside. Per Dr. Silas Flood note on 11/24 orders to hold Heparin drip at 6am on 11/26. No orders reflect this. Eulogio Bear asked this RN to stop Heparin drip at this time. See [facility]

## 2020-04-29 NOTE — Op Note (Signed)
Video Bronchoscopy Procedure Note  Date of Operation: 04/29/2020  Pre-op Diagnosis: pulmonary nodules, immunosuppressed   Post-op Diagnosis: pulmonary nodules, immunosuppressed  Surgeon: Garner Nash, DO   Assistants: None   Anesthesia: general   Operation: Flexible video fiberoptic bronchoscopy and biopsies.  Estimated Blood Loss: None   Complications: none noted  Indications and History: Cody Arellano is 74 y.o. with history of SLE, on imuran, pulmonary nodules.  Recommendation was to perform video fiberoptic bronchoscopy with biopsies. The risks, benefits, complications, treatment options and expected outcomes were discussed with the patient.  The possibilities of pneumothorax, pneumonia, reaction to medication, pulmonary aspiration, perforation of a viscus, bleeding, failure to diagnose a condition and creating a complication requiring transfusion or operation were discussed with the patient who freely signed the consent.    Description of Procedure: The patient was seen in the Preoperative Area, was examined and was deemed appropriate to proceed.  The patient was taken to [facility] endoscopy room 2, identified as Cody Arellano and the procedure verified as Flexible Video Fiberoptic Bronchoscopy.  A Time Out was held and the above information confirmed.   The video fiberoptic bronchoscope was introduced via the ETT and general inspection was performed which showed normal trachea, normal main carina. The R sided airways were inspected and showed normal RUL, BI, RML and RLL. The L side was then inspected. The LLL, Lingular and LUL airways were normal.   BAL to the LUL and RML were completed. ~180cc of saline were instilled in each location with moderate return.   Samples: 1. Bronchoalveolar lavage left upper lobe  2. Bronchoalveolar lavage right middle lobe   Plans:  We will review the microbiology results and cell counts will be followed up on. Specimens were sent for cell count, afb,  fungal, bacterial cultures, PCP DFA and aspergillus antigens.   Garner Nash, DO [facility] 04/29/2020 12:30 PM

## 2020-04-29 NOTE — Progress Notes (Signed)
Progress Note    Cody Arellano  YJE:[id number] DOB: [birth year]  DOA: 04/26/2020 PCP: Venia Carbon, MD    Brief Narrative:     Medical records reviewed and are as summarized below:  Cody Arellano is an 74 y.o. male with medical history significant forlupus, interstitial lung disease, coronary artery disease, chronic systolic CHF, paroxysmal atrial fibrillation on Eliquis, and recent PEwho presents to the emergency department due to shortness of breath.  THis is his 3rd hospitalization in the last 2 months for the same/similar issues.    Assessment/Plan:   Principal Problem:   Pleural effusion on right Active Problems:   CAD (coronary artery disease)   Hyperlipemia   Paroxysmal atrial fibrillation (HCC)   Heart failure, systolic, chronic (HCC)   Pulmonary embolism (HCC)   Acute on chronic systolic CHF (congestive heart failure) (HCC)   Shortness of breath   Elevated brain natriuretic peptide (BNP) level   AKI (acute kidney injury) (HCC)   Pulmonary nodules   Nausea & vomiting   Dyspnea possibly secondary to multifactorial: lupus, right pleural effusion,CHF, pulmonary embolism  -C-T angiography of chest showed large right-sided pleural effusion and small left-sided pleural effusion also noted.   -S/p diagnostic and therapeutic thoracentesis with 1.1 L retrieval:  -sats low 90s on RA -daily weights/I/Os -cards consult appreciated as echo from 10/21 changed from CABG: evolution of echo studies from TEE intraoperative and thoracic one post op to recent echo in hospital.  Now with dilated RA and LA and severe TR/MR which was not there before -echo: EF is reduced. Mitral valve regurgitation is now severe  Nausea and vomiting: intermittant Continue IV Zofran p.r.n.  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  At baseline his creatinine is normal.   -lasix on hold  Hyperlipidemia Continue Crestor  Pulmonary embolism Patient states that he has been compliant with Eliquis, last dose was 11/23 -IV heparin gtt  History of paroxysmal atrial fibrillation CHADS-VASc is 70 (age, CHF, CAD) Eliquis on hold for procedures and started IV heparin gtt  Lupus Patient endorsed history of SLE with pulmonary involvement. Continue Plaquenil and Imuran    Family Communication/Anticipated D/C date and plan/Code Status   DVT prophylaxis: heparin Code Status: Full Code.  Family Communication: wife at bedside Disposition Plan: Status is: Inpatient  Remains inpatient appropriate because:Inpatient level of care appropriate due to severity of illness   Dispo: The patient is from: Home              Anticipated d/c is to: Home              Anticipated d/c date is: > 3 days              Patient currently is not medically stable to d/c. cards eval, bronch in AM per PCCM         Medical Consultants:    Cards  IR  PCCM     Subjective:   In bed, NAD  Objective:    Vitals:   04/29/20 0601 04/29/20 0800 04/29/20 1000 04/29/20 1126  BP: 101/74 96/70 103/72 107/70  Pulse: (!) 59 (!) 58 (!) 57 60  Resp: 16   14  Temp: 98 F (36.7 C)  98.5 F (36.9 C)  TempSrc: Oral   Oral  SpO2: 93% 94% 93% 100%  Weight:    61.9 kg  Height:    6' (1.829 m)    Intake/Output Summary (Last 24 hours) at 04/29/2020 1217 Last data filed at 04/29/2020 0900 Gross per 24 hour  Intake 379.31 ml  Output 1100 ml  Net -720.69 ml   Filed Weights   04/28/20 1154 04/29/20 0600 04/29/20 1126  Weight: 62.5 kg 61.9 kg 61.9 kg    Exam:  General: Appearance:    Cachectic male in no acute distress     Lungs:     On RA, respirations unlabored  Heart:    Normal heart  rate.  No murmurs, rubs, or gallops.   MS:   All extremities are intact.   Neurologic:   Awake, alert, oriented x 3. No apparent focal neurological           defect.     Data Reviewed:   I have personally reviewed following labs and imaging studies:  Labs: Labs show the following:   Basic Metabolic Panel: Recent Labs  Lab 04/26/20 1319 04/26/20 1319 04/27/20 0337 04/27/20 0337 04/28/20 1200 04/29/20 0851  NA 141  --  140  --  139 139  K 3.7   < > 3.0*   < > 4.0 3.7  CL 101  --  102  --  102 102  CO2 27  --  27  --  25 25  GLUCOSE 96  --  85  --  130* 68*  BUN 18  --  17  --  21 20  CREATININE 1.45*  --  1.37*  --  1.60* 1.47*  CALCIUM 9.0  --  8.5*  --  8.6* 8.4*  MG  --   --  1.9  --  2.2  --   PHOS  --   --  4.1  --   --   --    < > = values in this interval not displayed.   GFR Estimated Creatinine Clearance: 38.6 mL/min (A) (by C-G formula based on SCr of 1.47 mg/dL (H)). Liver Function Tests: Recent Labs  Lab 04/27/20 0337 04/27/20 1125  AST 21  --   ALT 19  --   ALKPHOS 48  --   BILITOT 1.1  --   PROT 6.1*  --   ALBUMIN 2.7* 3.0*   No results for input(s): LIPASE, AMYLASE in the last 168 hours. No results for input(s): AMMONIA in the last 168 hours. Coagulation profile Recent Labs  Lab 04/27/20 0337  INR 2.1*    CBC: Recent Labs  Lab 04/26/20 1319 04/27/20 0337 04/28/20 0300 04/29/20 0336  WBC 4.1 3.7* 3.8* 3.1*  HGB 11.9* 11.2* 12.1* 11.4*  HCT 40.1 36.5* 39.7 36.9*  MCV 89.9 88.0 87.6 88.5  PLT 187 192 170 165   Cardiac Enzymes: No results for input(s): CKTOTAL, CKMB, CKMBINDEX, TROPONINI in the last 168 hours. BNP (last 3 results) No results for input(s): PROBNP in the last 8760 hours. CBG: No results for input(s): GLUCAP in the last 168 hours. D-Dimer: No results for input(s): DDIMER in the last 72 hours. Hgb A1c: No results for input(s): HGBA1C in the last 72 hours. Lipid Profile: No results for input(s): CHOL, HDL, LDLCALC,  TRIG, CHOLHDL, LDLDIRECT in the last 72 hours. Thyroid function studies: No results for input(s): TSH, T4TOTAL, T3FREE, THYROIDAB in the last 72 hours.  Invalid input(s): FREET3 Anemia work up: No  results for input(s): VITAMINB12, FOLATE, FERRITIN, TIBC, IRON, RETICCTPCT in the last 72 hours. Sepsis Labs: Recent Labs  Lab 04/26/20 1319 04/27/20 0337 04/28/20 0300 04/29/20 0336  WBC 4.1 3.7* 3.8* 3.1*    Microbiology Recent Results (from the past 240 hour(s))  Respiratory Panel by RT PCR (Flu A&B, Covid) - Nasopharyngeal Swab     Status: None   Collection Time: 04/26/20  3:06 PM   Specimen: Nasopharyngeal Swab; Nasopharyngeal(NP) swabs in vial transport medium  Result Value Ref Range Status   SARS Coronavirus 2 by RT PCR NEGATIVE NEGATIVE Final    Comment: (NOTE) SARS-CoV-2 target nucleic acids are NOT DETECTED.  The SARS-CoV-2 RNA is generally detectable in upper respiratoy specimens during the acute phase of infection. The lowest concentration of SARS-CoV-2 viral copies this assay can detect is 131 copies/mL. A negative result does not preclude SARS-Cov-2 infection and should not be used as the sole basis for treatment or other patient management decisions. A negative result may occur with  improper specimen collection/handling, submission of specimen other than nasopharyngeal swab, presence of viral mutation(s) within the areas targeted by this assay, and inadequate number of viral copies (<131 copies/mL). A negative result must be combined with clinical observations, patient history, and epidemiological information. The expected result is Negative.  Fact Sheet for Patients:  PinkCheek.be  Fact Sheet for Healthcare Providers:  GravelBags.it  This test is no t yet approved or cleared by the [place] FDA and  has been authorized for detection and/or diagnosis of SARS-CoV-2 by FDA under an Emergency Use  Authorization (EUA). This EUA will remain  in effect (meaning this test can be used) for the duration of the COVID-19 declaration under Section 564(b)(1) of the Act, 21 U.S.C. section 360bbb-3(b)(1), unless the authorization is terminated or revoked sooner.     Influenza A by PCR NEGATIVE NEGATIVE Final   Influenza B by PCR NEGATIVE NEGATIVE Final    Comment: (NOTE) The Xpert Xpress SARS-CoV-2/FLU/RSV assay is intended as an aid in  the diagnosis of influenza from Nasopharyngeal swab specimens and  should not be used as a sole basis for treatment. Nasal washings and  aspirates are unacceptable for Xpert Xpress SARS-CoV-2/FLU/RSV  testing.  Fact Sheet for Patients: PinkCheek.be  Fact Sheet for Healthcare Providers: GravelBags.it  This test is not yet approved or cleared by the [place] FDA and  has been authorized for detection and/or diagnosis of SARS-CoV-2 by  FDA under an Emergency Use Authorization (EUA). This EUA will remain  in effect (meaning this test can be used) for the duration of the  Covid-19 declaration under Section 564(b)(1) of the Act, 21  U.S.C. section 360bbb-3(b)(1), unless the authorization is  terminated or revoked. Performed at [facility], [place] [address] [place], [place] [address]   Gram stain     Status: None   Collection Time: 04/27/20  9:43 AM   Specimen: Lung, Right; Pleural Fluid  Result Value Ref Range Status   Specimen Description PLEURAL FLUID  Final   Special Requests RIGHT LUNG  Final   Gram Stain   Final    CYTOSPIN SMEAR WBC PRESENT,BOTH PMN AND MONONUCLEAR NO ORGANISMS SEEN Performed at [facility], [place] [address] [place], [place] [address]    Report Status 04/27/2020 FINAL  Final  Acid Fast Smear (AFB)     Status: None   Collection Time: 04/27/20  9:43 AM   Specimen: Lung, Right; Pleural Fluid  Result Value Ref Range Status  AFB Specimen Processing  Concentration  Final   Acid Fast Smear Negative  Final    Comment: (NOTE) Performed At: [facility] [place], [place] [id number] Rush Farmer MD QI:[id number]    Source (AFB) PLEURAL  Final    Comment: FLUID RIGHT LUNG Performed at [facility], [place] [address], [place], [place] [address]   Culture, body fluid-bottle     Status: None (Preliminary result)   Collection Time: 04/27/20  9:43 AM   Specimen: Pleura  Result Value Ref Range Status   Specimen Description PLEURAL FLUID  Final   Special Requests RIGHT LUNG  Final   Culture   Final    NO GROWTH < 24 HOURS Performed at [facility], [place] [address], [place],  [address]    Report Status PENDING  Incomplete    Procedures and diagnostic studies:  DG CHEST PORT 1 VIEW  Result Date: 04/27/2020 CLINICAL DATA:  Follow-up right thoracentesis EXAM: PORTABLE CHEST 1 VIEW COMPARISON:  Earlier same day FINDINGS: Cardiomegaly. Mediastinal surgical clip. Atrial clip. Aortic atherosclerotic calcification. Chronic interstitial lung markings. No evidence of pneumothorax or visible pleural fluid. Question development of pulmonary venous hypertension since the previous exam. IMPRESSION: No pneumothorax following thoracentesis. Question development of pulmonary venous hypertension. Electronically Signed   By: Nelson Chimes M.D.   On: 04/27/2020 16:46   ECHOCARDIOGRAM LIMITED  Result Date: 04/28/2020    ECHOCARDIOGRAM LIMITED REPORT   [name] Date of Exam: 04/28/2020 Medical Rec #:  [id number]    Height:       72.0 in Accession #:    [id number]   Weight:       137.8 lb Date of Birth:  [birth year]    BSA:          1.819 m Patient Age:    77 years     BP:           100/77 mmHg Patient Gender: M            HR:           66 bpm. Exam Location:  Inpatient Procedure: Limited Echo, Limited Color Doppler, Cardiac Doppler and Intracardiac            Opacification Agent Indications:    Dyspnea  786.09 / R06.00  History:        Patient has prior history of Echocardiogram examinations, most                 recent 03/23/2020. CHF, CAD, Prior CABG, Arrythmias:Atrial                 Fibrillation; Risk Factors:Hypertension. Lupus.  Sonographer:    Darlina Sicilian RDCS Referring Phys: [place]  1. Left ventricular ejection fraction, by estimation, is 30 to 35%. The left ventricle has moderately decreased function. The left ventricle demonstrates global hypokinesis. The left ventricular internal cavity size was mildly dilated. Left ventricular diastolic parameters are consistent with Grade III diastolic dysfunction (restrictive).  2. Right ventricular systolic function is mildly reduced. The right ventricular size is moderately enlarged. There is mildly elevated pulmonary artery systolic pressure. The estimated right ventricular systolic pressure is 02.7 mmHg.  3. Left atrial size was moderately dilated.  4. Right atrial size was severely dilated.  5. The mitral valve is degenerative. Severe mitral valve regurgitation. Moderate mitral annular calcification.  6. Tricuspid valve regurgitation is moderate.  7. The aortic valve  is tricuspid. Aortic valve regurgitation is mild. Mild aortic valve sclerosis is present, with no evidence of aortic valve stenosis. Comparison(s): EF is reduced. Mitral valve regurgitation is now severe. FINDINGS  Left Ventricle: Left ventricular ejection fraction, by estimation, is 30 to 35%. The left ventricle has moderately decreased function. The left ventricle demonstrates global hypokinesis. Definity contrast agent was given IV to delineate the left ventricular endocardial borders. The left ventricular internal cavity size was mildly dilated. Left ventricular diastolic parameters are consistent with Grade III diastolic dysfunction (restrictive). The ratio of pulmonic flow to systemic flow (Qp/Qs ratio) is 0.70.  LV Wall Scoring: The mid and distal anterior wall,  entire apex, mid anterolateral segment, and mid inferoseptal segment are akinetic. The mid inferolateral segment and mid inferior segment are hypokinetic. Right Ventricle: The right ventricular size is moderately enlarged. Right ventricular systolic function is mildly reduced. There is mildly elevated pulmonary artery systolic pressure. The tricuspid regurgitant velocity is 2.55 m/s, and with an assumed right atrial pressure of 15 mmHg, the estimated right ventricular systolic pressure is 02.4 mmHg. Left Atrium: Left atrial size was moderately dilated. Right Atrium: Right atrial size was severely dilated. Mitral Valve: The mitral valve is degenerative in appearance. Moderate mitral annular calcification. Severe mitral valve regurgitation. Tricuspid Valve: Tricuspid valve regurgitation is moderate. Aortic Valve: The aortic valve is tricuspid. Aortic valve regurgitation is mild. Mild aortic valve sclerosis is present, with no evidence of aortic valve stenosis. IAS/Shunts: The ratio of pulmonic flow to systemic flow (Qp/Qs ratio) is 0.70. LEFT VENTRICLE PLAX 2D LVIDd:         5.70 cm      Diastology LVIDs:         4.40 cm      LV e' medial:    5.56 cm/s LV PW:         0.90 cm      LV E/e' medial:  17.2 LV IVS:        1.00 cm      LV e' lateral:   8.72 cm/s LVOT diam:     1.90 cm      LV E/e' lateral: 10.9 LV SV:         22 LV SV Index:   12 LVOT Area:     2.84 cm  LV Volumes (MOD) LV vol d, MOD A2C: 260.0 ml LV vol d, MOD A4C: 186.0 ml LV vol s, MOD A2C: 157.0 ml LV vol s, MOD A4C: 110.0 ml LV SV MOD A2C:     103.0 ml LV SV MOD A4C:     186.0 ml LV SV MOD BP:      84.8 ml RIGHT VENTRICLE RVOT diam:      2.10 cm LEFT ATRIUM         Index LA diam:    4.20 cm 2.31 cm/m  AORTIC VALVE             PULMONIC VALVE LVOT Vmax:   50.90 cm/s  RVOT Peak grad: 1 mmHg LVOT Vmean:  37.900 cm/s LVOT VTI:    0.079 m  AORTA Ao Asc diam: 3.40 cm MITRAL VALVE                 TRICUSPID VALVE MV Area (PHT): 5.60 cm      TR Peak grad:    26.0 mmHg MV Decel Time: 136 msec      TR Vmax:        255.00 cm/s MR Peak grad:  59.6 mmHg MR Mean grad:    41.0 mmHg   SHUNTS MR Vmax:         386.00 cm/s Systemic VTI:  0.08 m MR Vmean:        304.0 cm/s  Systemic Diam: 1.90 cm MR PISA:         4.02 cm    Pulmonic VTI:  0.046 m MR PISA Eff ROA: 39 mm      Pulmonic Diam: 2.10 cm MR PISA Radius:  0.80 cm     Qp/Qs:         0.71 MV E velocity: 95.30 cm/s Candee Furbish MD Electronically signed by Candee Furbish MD Signature Date/Time: 04/28/2020/4:17:00 PM    Final     Medications:   . [MAR Hold] aspirin EC  81 mg Oral Daily  . [MAR Hold] azaTHIOprine  100 mg Oral BID  . [MAR Hold] feeding supplement  237 mL Oral TID BM  . [MAR Hold] hydroxychloroquine  400 mg Oral QHS  . [MAR Hold] rosuvastatin  20 mg Oral Daily  . [MAR Hold] tamsulosin  0.4 mg Oral QPM   Continuous Infusions: . heparin Stopped (04/29/20 0848)  . lactated ringers 20 mL/hr at 04/29/20 1203     LOS: 3 days   Geradine Girt  [facility]   How to contact the [facility] Attending or Consulting provider [place] or covering provider during after hours [place], for this patient?  1. Check the care team in [facility] and look for a) attending/consulting [facility] provider listed and b) the [facility] team listed 2. Log into [email or web address] and use [place]'s universal password to access. If you do not have the password, please contact the hospital operator. 3. Locate the [facility] provider you are looking for under [facility] and page to a number that you can be directly reached. 4. If you still have difficulty reaching the provider, please page the [facility] (Director on Call) for the Hospitalists listed on amion for assistance.  04/29/2020, 12:17 PM

## 2020-04-30 ENCOUNTER — Inpatient Hospital Stay (HOSPITAL_COMMUNITY): Payer: No Typology Code available for payment source

## 2020-04-30 ENCOUNTER — Inpatient Hospital Stay: Payer: Self-pay

## 2020-04-30 DIAGNOSIS — I5023 Acute on chronic systolic (congestive) heart failure: Secondary | ICD-10-CM

## 2020-04-30 DIAGNOSIS — R112 Nausea with vomiting, unspecified: Secondary | ICD-10-CM

## 2020-04-30 DIAGNOSIS — N179 Acute kidney failure, unspecified: Secondary | ICD-10-CM | POA: Diagnosis not present

## 2020-04-30 DIAGNOSIS — R7989 Other specified abnormal findings of blood chemistry: Secondary | ICD-10-CM

## 2020-04-30 DIAGNOSIS — R570 Cardiogenic shock: Secondary | ICD-10-CM

## 2020-04-30 DIAGNOSIS — I5022 Chronic systolic (congestive) heart failure: Secondary | ICD-10-CM

## 2020-04-30 DIAGNOSIS — J90 Pleural effusion, not elsewhere classified: Secondary | ICD-10-CM | POA: Diagnosis not present

## 2020-04-30 LAB — PROCALCITONIN: Procalcitonin: 0.18 ng/mL

## 2020-04-30 LAB — BRAIN NATRIURETIC PEPTIDE: B Natriuretic Peptide: 1685.5 pg/mL — ABNORMAL HIGH (ref 0.0–100.0)

## 2020-04-30 LAB — COOXEMETRY PANEL
Carboxyhemoglobin: 1.2 % (ref 0.5–1.5)
Carboxyhemoglobin: 1.3 % (ref 0.5–1.5)
Methemoglobin: 0.8 % (ref 0.0–1.5)
Methemoglobin: 0.9 % (ref 0.0–1.5)
O2 Saturation: 41.4 %
O2 Saturation: 46.6 %
Total hemoglobin: 12 g/dL (ref 12.0–16.0)
Total hemoglobin: 11.2 g/dL — ABNORMAL LOW (ref 12.0–16.0)

## 2020-04-30 LAB — ACID FAST SMEAR (AFB, MYCOBACTERIA)
Acid Fast Smear: NEGATIVE
Acid Fast Smear: NEGATIVE

## 2020-04-30 LAB — CBC
HCT: 43.2 % (ref 39.0–52.0)
Hemoglobin: 12.6 g/dL — ABNORMAL LOW (ref 13.0–17.0)
MCH: 26.9 pg (ref 26.0–34.0)
MCHC: 29.2 g/dL — ABNORMAL LOW (ref 30.0–36.0)
MCV: 92.1 fL (ref 80.0–100.0)
Platelets: 180 10*3/uL (ref 150–400)
RBC: 4.69 MIL/uL (ref 4.22–5.81)
RDW: 18 % — ABNORMAL HIGH (ref 11.5–15.5)
WBC: 3.8 10*3/uL — ABNORMAL LOW (ref 4.0–10.5)
nRBC: 0.5 % — ABNORMAL HIGH (ref 0.0–0.2)

## 2020-04-30 LAB — BASIC METABOLIC PANEL
Anion gap: 10 (ref 5–15)
BUN: 21 mg/dL (ref 8–23)
CO2: 28 mmol/L (ref 22–32)
Calcium: 8.5 mg/dL — ABNORMAL LOW (ref 8.9–10.3)
Chloride: 101 mmol/L (ref 98–111)
Creatinine, Ser: 1.49 mg/dL — ABNORMAL HIGH (ref 0.61–1.24)
GFR, Estimated: 49 mL/min — ABNORMAL LOW (ref >60)
Glucose, Bld: 77 mg/dL (ref 70–99)
Potassium: 3.7 mmol/L (ref 3.5–5.1)
Sodium: 139 mmol/L (ref 135–145)

## 2020-04-30 LAB — APTT: aPTT: 63 seconds — ABNORMAL HIGH (ref 24–36)

## 2020-04-30 LAB — HEPARIN LEVEL (UNFRACTIONATED): Heparin Unfractionated: >2.2 IU/mL — ABNORMAL HIGH (ref 0.30–0.70)

## 2020-04-30 LAB — LACTIC ACID, PLASMA: Lactic Acid, Venous: 1.8 mmol/L (ref 0.5–1.9)

## 2020-04-30 MED ORDER — SODIUM CHLORIDE 0.9% FLUSH: 10.0000 mL | INTRAVENOUS | Status: DC | PRN

## 2020-04-30 MED ORDER — BISACODYL 10 MG RE SUPP: 10.0000 mg | Freq: Every day | RECTAL | Status: DC | PRN

## 2020-04-30 MED ORDER — FUROSEMIDE 10 MG/ML IJ SOLN
40.0000 mg | Freq: Once | INTRAMUSCULAR | Status: AC
  Administered 2020-04-30: 40 mg via INTRAVENOUS
  Filled 2020-04-30: qty 4

## 2020-04-30 MED ORDER — NOREPINEPHRINE 4 MG/250ML-% IV SOLN
2.0000 ug/min | INTRAVENOUS | Status: DC
  Filled 2020-04-30: qty 250

## 2020-04-30 MED ORDER — MILRINONE LACTATE IN DEXTROSE 20-5 MG/100ML-% IV SOLN
0.3750 ug/kg/min | INTRAVENOUS | Status: DC
  Administered 2020-04-30: 0.25 ug/kg/min via INTRAVENOUS
  Administered 2020-05-01 – 2020-05-02 (×3): 0.375 ug/kg/min via INTRAVENOUS
  Filled 2020-04-30 (×4): qty 100

## 2020-04-30 MED ORDER — APIXABAN 5 MG PO TABS: 5.0000 mg | ORAL_TABLET | Freq: Two times a day (BID) | ORAL | Status: DC

## 2020-04-30 MED ORDER — SODIUM CHLORIDE 0.9 % IV SOLN
2.0000 g | INTRAVENOUS | Status: DC
  Administered 2020-04-30 – 2020-05-01 (×2): 2 g via INTRAVENOUS
  Filled 2020-04-30 (×2): qty 20

## 2020-04-30 MED ORDER — DOCUSATE SODIUM 100 MG PO CAPS
100.0000 mg | ORAL_CAPSULE | Freq: Two times a day (BID) | ORAL | Status: DC
  Administered 2020-04-30 – 2020-05-02 (×2): 100 mg via ORAL
  Filled 2020-04-30 (×2): qty 1

## 2020-04-30 MED ORDER — SODIUM CHLORIDE 0.9 % IV SOLN
250.0000 mL | INTRAVENOUS | Status: DC
  Administered 2020-04-30: 20:00:00 250 mL via INTRAVENOUS

## 2020-04-30 MED ORDER — SODIUM CHLORIDE 0.9% FLUSH
10.0000 mL | Freq: Two times a day (BID) | INTRAVENOUS | Status: DC
  Administered 2020-04-30 – 2020-05-03 (×7): 10 mL
  Administered 2020-05-03: 14:00:00 20 mL
  Administered 2020-05-04 – 2020-05-21 (×27): 10 mL
  Administered 2020-05-21: 23:00:00 20 mL
  Administered 2020-05-22 – 2020-05-23 (×3): 10 mL

## 2020-04-30 MED ORDER — CHLORHEXIDINE GLUCONATE CLOTH 2 % EX PADS
6.0000 | MEDICATED_PAD | Freq: Every day | CUTANEOUS | Status: DC
  Administered 2020-04-30 – 2020-05-23 (×24): 6 via TOPICAL

## 2020-04-30 MED ORDER — PROCHLORPERAZINE EDISYLATE 10 MG/2ML IJ SOLN
10.0000 mg | Freq: Once | INTRAMUSCULAR | Status: AC
  Administered 2020-04-30: 10 mg via INTRAVENOUS
  Filled 2020-04-30: qty 2

## 2020-04-30 MED ORDER — LACTATED RINGERS IV BOLUS
500.0000 mL | Freq: Once | INTRAVENOUS | Status: AC
  Administered 2020-04-30: 500 mL via INTRAVENOUS

## 2020-04-30 MED ORDER — SODIUM CHLORIDE 0.9 % IV SOLN
500.0000 mg | INTRAVENOUS | Status: DC
  Administered 2020-04-30 – 2020-05-01 (×2): 500 mg via INTRAVENOUS
  Filled 2020-04-30 (×2): qty 500

## 2020-04-30 MED ORDER — APIXABAN 5 MG PO TABS
10.0000 mg | ORAL_TABLET | Freq: Two times a day (BID) | ORAL | Status: DC
  Administered 2020-04-30 – 2020-05-02 (×5): 10 mg via ORAL
  Filled 2020-04-30 (×5): qty 2

## 2020-04-30 NOTE — Progress Notes (Signed)
NAME:  Cody Arellano, MRN:  [id number], DOB:  08-10-1945, LOS: 4 ADMISSION DATE:  04/26/2020, CONSULTATION DATE:  04/30/20 REFERRING MD:  Geradine Girt, DO, CHIEF COMPLAINT:  DOE   Brief History   Patient is a 74 year old male I was asked to evaluate at bedside due to hypotension felt secondary to cardiogenic shock.  He currently is being evaluated by pulmonary critical care recurrent effusion and pulmonary nodules.  History of present illness   Patient was admitted with pleural effusion cardiomyopathy with ejection fraction of 30%, chronic A. fib and SLE on Imuran and Plaquenil.  Patient developed hypotension worse than baseline this evening.  On my evaluation the patient is awake alert interactive says he feels weak and tired.  He does not complain of dyspnea.  O2 saturation in the high 90s on room air.  He is not breathing rapidly nor using accessory muscles.  Chest x-ray shows increased interstitial markings bilaterally blood pressure is 92/70.  Hemoglobin is 11 and creatinine is stable at 1.49, platelet count 180.  Plan is for transfer to the ICU and initiation of milrinone.  BNP is 1600.  Past Medical History  CHF w/ reduced EF, mitral regurg, CAD, Afib, SLE  Significant Hospital Events   Admitted 11/23 thora 11/23  Consults:  PCCM VIR  Procedures:  Inocencio Homes 11/24  Significant Diagnostic Tests:  CT chest 11/23 - R>L bilateral pleural effusions, stable nodules largest 2 in LUL, pulmonary edema, bullous emphysematous changes scattered throughout  Pleural fluid transudate 11/24  Micro Data:  Pleural fluid culture pending  Antimicrobials:  n/a  Interim history/subjective:   Doing well this morning. No complaints. Planned bronch this morning .   Objective   Blood pressure 92/66, pulse 63, temperature 98.7 F (37.1 C), temperature source Oral, resp. rate 18, height 6' (1.829 m), weight 62.9 kg, SpO2 92 %.        Intake/Output Summary (Last 24 hours) at 04/30/2020  2013 Last data filed at 04/30/2020 1829 Gross per 24 hour  Intake 2580.87 ml  Output 2325 ml  Net 255.87 ml   Filed Weights   04/29/20 0600 04/29/20 1126 04/30/20 1000  Weight: 61.9 kg 61.9 kg 62.9 kg    Examination: General: male, resting in bed, no distress Eyes: tracking  Neck: no jvp  Lungs: ctab, no wheeze  Cardiovascular: RRR, systolic murmur Abdomen: soft, nt nd  Extremities: no edema  Neuro: alert, follows commands  Psych: normal mood and affect    Resolved Hospital Problem list   n/a  Assessment & Plan:   Hypotension secondary to cardiogenic shock: Agree with current plan.  Will reevaluate once patient transferred to ICU.  Nothing to add at this point time.  Plans in regards to pulmonary nodules and recurrent effusion unchanged see prior pulmonary note.  Best practice (evaluated daily)   Per primary  Labs   CBC: Recent Labs  Lab 04/26/20 1319 04/27/20 0337 04/28/20 0300 04/29/20 0336 04/30/20 0248  WBC 4.1 3.7* 3.8* 3.1* 3.8*  HGB 11.9* 11.2* 12.1* 11.4* 12.6*  HCT 40.1 36.5* 39.7 36.9* 43.2  MCV 89.9 88.0 87.6 88.5 92.1  PLT 187 192 170 165 235    Basic Metabolic Panel: Recent Labs  Lab 04/26/20 1319 04/27/20 0337 04/28/20 1200 04/29/20 0851 04/30/20 0248  NA 141 140 139 139 139  K 3.7 3.0* 4.0 3.7 3.7  CL 101 102 102 102 101  CO2 27 27 25 25 28   GLUCOSE 96 85 130* 68* 77  BUN 18  17 21 20 21   CREATININE 1.45* 1.37* 1.60* 1.47* 1.49*  CALCIUM 9.0 8.5* 8.6* 8.4* 8.5*  MG  --  1.9 2.2  --   --   PHOS  --  4.1  --   --   --    GFR: Estimated Creatinine Clearance: 38.7 mL/min (A) (by C-G formula based on SCr of 1.49 mg/dL (H)). Recent Labs  Lab 04/27/20 0337 04/28/20 0300 04/29/20 0336 04/30/20 0248  PROCALCITON  --   --   --  0.18  WBC 3.7* 3.8* 3.1* 3.8*    Liver Function Tests: Recent Labs  Lab 04/27/20 0337 04/27/20 1125  AST 21  --   ALT 19  --   ALKPHOS 48  --   BILITOT 1.1  --   PROT 6.1*  --   ALBUMIN 2.7* 3.0*    No results for input(s): LIPASE, AMYLASE in the last 168 hours. No results for input(s): AMMONIA in the last 168 hours.  ABG    Component Value Date/Time   PHART 7.371 10/18/2019 0224   PCO2ART 35.4 10/18/2019 0224   PO2ART 113 (H) 10/18/2019 0224   HCO3 20.5 10/18/2019 0224   TCO2 23 11/12/2019 1730   ACIDBASEDEF 4.0 (H) 10/18/2019 0224   O2SAT 41.4 04/30/2020 1435     Coagulation Profile: Recent Labs  Lab 04/27/20 0337  INR 2.1*    Cardiac Enzymes: No results for input(s): CKTOTAL, CKMB, CKMBINDEX, TROPONINI in the last 168 hours.  HbA1C: Hgb A1c MFr Bld  Date/Time Value Ref Range Status  10/17/2019 12:05 PM 5.7 (H) 4.8 - 5.6 % Final    Comment:    (NOTE) Pre diabetes:          5.7%-6.4% Diabetes:              >6.4% Glycemic control for   <7.0% adults with diabetes     CBG: No results for input(s): GLUCAP in the last 168 hours.  Review of Systems:   No neurologic changes. No headache. No night sweats. Comprehensive review of systems otherwise negative.   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   Surgical History    Past Surgical History:  Procedure Laterality Date  . CARDIAC CATHETERIZATION    . CLIPPING OF ATRIAL APPENDAGE N/A 10/17/2019   Procedure: Clipping Of Atrial Appendage using AtriCure [id number] 45 MM AtriClip.;  Surgeon: Rexene Alberts, MD;  Location: [facility];  Service: Open Heart Surgery;  Laterality: N/A;  . CORONARY ARTERY BYPASS GRAFT N/A 10/17/2019   Procedure: CORONARY ARTERY BYPASS GRAFTING (CABG) using LIMA to LAD; Endoscopic harvest right greater saphenous vein: SVG to RAMUS.;   Surgeon: Rexene Alberts, MD;  Location: [facility];  Service: Open Heart Surgery;  Laterality: N/A;  . CORONARY BALLOON ANGIOPLASTY N/A 10/17/2019   Procedure: CORONARY BALLOON ANGIOPLASTY;  Surgeon: Nelva Bush, MD;  Location: [facility];  Service: Cardiovascular;  Laterality: N/A;  . coronary stents  2009  . CORONARY/GRAFT ACUTE MI REVASCULARIZATION N/A 10/17/2019   Procedure: Coronary/Graft Acute MI Revascularization;  Surgeon: Nelva Bush, MD;  Location: [facility];  Service: Cardiovascular;  Laterality: N/A;  . ENDOVEIN HARVEST OF GREATER SAPHENOUS VEIN Right 10/17/2019   Procedure:  [place] Of Greater Saphenous Vein;  Surgeon: Rexene Alberts, MD;  Location: [facility];  Service: Open Heart Surgery;  Laterality: Right;  . IABP INSERTION N/A 10/17/2019   Procedure: IABP Insertion;  Surgeon: Nelva Bush, MD;  Location: [facility];  Service: Cardiovascular;  Laterality: N/A;  . INCISION AND DRAINAGE ABSCESS N/A 01/29/2020   Procedure: INCISION AND DRAINAGE BILATERAL PERIRECTAL ABSCESS;  Surgeon: Stark Klein, MD;  Location: [facility];  Service: General;  Laterality: N/A;  . IR THORACENTESIS ASP PLEURAL SPACE W/IMG GUIDE  10/26/2019  . IR THORACENTESIS ASP PLEURAL SPACE W/IMG GUIDE  04/27/2020  . RIGHT/LEFT HEART CATH AND CORONARY ANGIOGRAPHY N/A 10/17/2019   Procedure: RIGHT/LEFT HEART CATH AND CORONARY ANGIOGRAPHY;  Surgeon: Nelva Bush, MD;  Location: [facility];  Service: Cardiovascular;  Laterality: N/A;  . TEE WITHOUT CARDIOVERSION  10/17/2019   Procedure: Transesophageal Echocardiogram (Tee);  Surgeon: Rexene Alberts, MD;  [facility];  Service: Open Heart Surgery;;     Social History   reports that he quit smoking about 27 years ago. His smoking use included cigarettes. He has a 20.00 pack-year smoking history. He has never used smokeless tobacco. He reports that he does not drink alcohol and does not use drugs.   Family History   His  family history includes Alcohol abuse in his father; Cancer in his brother; Diabetes in his paternal uncle; Heart disease in his mother; Hyperlipidemia in his brother and sister; Hypertension in his brother and sister; Stomach cancer in his brother. There is no history of Colon cancer, Esophageal cancer, or Rectal cancer.   Allergies No Known Allergies   Home Medications  Prior to Admission medications   Medication Sig Start Date End Date Taking? Authorizing Provider  apixaban (ELIQUIS) 5 MG TABS tablet Take 1 tablet (5 mg total) by mouth 2 (two) times daily. 04/01/20 05/01/20 Yes Pahwani, Einar Grad, MD  aspirin EC 81 MG tablet Take 1 tablet (81 mg total) by mouth daily. 08/10/14  Yes Nahser, Wonda Cheng, MD  azaTHIOprine (IMURAN) 50 MG tablet Take 100 mg by mouth 2 (two) times daily.  10/11/14  Yes [provider]  furosemide (LASIX) 40 MG tablet Please take 40 mg oral daily, and take extra 40 mg of Lasix if you gain 3 pounds have worsening lower extremity edema for 1 day. 04/07/20  Yes Elgergawy, Silver Huguenin, MD  hydroxychloroquine (PLAQUENIL) 200 MG tablet Take 400 mg by mouth at bedtime.    Yes [provider]  oxyCODONE (OXY IR/ROXICODONE) 5 MG immediate release tablet Take 1 tablet (5 mg total) by mouth every 6 (six) hours as needed for severe pain. 02/02/20  Yes Rai, Ripudeep K, MD  polyethylene glycol (MIRALAX / GLYCOLAX) 17 g packet Take 17 g by mouth daily as needed for moderate constipation.    Yes [provider]  polyvinyl alcohol (ARTIFICIAL TEARS) 1.4 % ophthalmic solution Place 2 drops into both eyes daily.    Yes [provider]  rosuvastatin (CRESTOR) 20 MG tablet Take 20 mg by mouth daily.   Yes [provider]  tamsulosin (FLOMAX) 0.4 MG CAPS capsule Take 0.4 mg by mouth every evening.  04/02/17  Yes [provider]     Over 35 minutes was spent bedside evaluation chart review and critical care [place], MD [place]  Pulmonary Critical Care 04/30/2020 8:13 PM

## 2020-04-30 NOTE — Progress Notes (Signed)
Progress Note  Patient Name: Cody Arellano Date of Encounter: 04/30/2020  [facility] Cardiologist: Mertie Moores, MD   Subjective   More short of breath. S/p bronchoscopy yesterday. Recorded minimally net negative. BP remains soft. CXR reviewed at the bedside, appears to be recruiting some fluid in the right lung base. Still volume overloaded - BNP 1685 today.  Inpatient Medications    Scheduled Meds: . aspirin EC  81 mg Oral Daily  . azaTHIOprine  100 mg Oral BID  . feeding supplement  237 mL Oral TID BM  . hydroxychloroquine  400 mg Oral QHS  . rosuvastatin  20 mg Oral Daily  . tamsulosin  0.4 mg Oral QPM   Continuous Infusions: . heparin 1,000 Units/hr (04/30/20 0544)  . lactated ringers Stopped (04/29/20 1350)   PRN Meds: lidocaine, ondansetron (ZOFRAN) IV, oxyCODONE, polyethylene glycol   Vital Signs    Vitals:   04/30/20 0000 04/30/20 0405 04/30/20 0600 04/30/20 0903  BP: 101/65 (!) 85/56 102/70 (!) 88/66  Pulse:   64 65  Resp:   20   Temp:   97.8 F (36.6 C) 98.3 F (36.8 C)  TempSrc:   Oral Oral  SpO2:    97%  Weight:      Height:        Intake/Output Summary (Last 24 hours) at 04/30/2020 1010 Last data filed at 04/30/2020 0545 Gross per 24 hour  Intake 1570.62 ml  Output 175 ml  Net 1395.62 ml   Last 3 Weights 04/29/2020 04/29/2020 04/28/2020  Weight (lbs) 136 lb 7.4 oz 136 lb 8 oz 137 lb 12.8 oz  Weight (kg) 61.9 kg 61.916 kg 62.506 kg      Telemetry    SR - Personally Reviewed  ECG    None today - Personally Reviewed  Physical Exam   GEN: No acute distress.   Neck: JVD 8 cm Cardiac: RRR, +3/6 blowing murmur at apex, no rubs, or gallops, +RV heave Respiratory: decreased BS R, rales L base  GI: Soft, nontender, non-distended  MS: No edema; No deformity. Neuro:  Nonfocal  Psych: Normal affect   Labs    High Sensitivity Troponin:   Recent Labs  Lab 04/06/20 2134 04/06/20 2341  TROPONINIHS 28* 25*      Chemistry Recent  Labs  Lab 04/27/20 0337 04/27/20 0337 04/27/20 1125 04/28/20 1200 04/29/20 0851 04/30/20 0248  NA 140   < >  --  139 139 139  K 3.0*   < >  --  4.0 3.7 3.7  CL 102   < >  --  102 102 101  CO2 27   < >  --  _0 GLUCOSE 85   < >  --  130* 68* 77  BUN 17   < >  --  _1 CREATININE 1.37*   < >  --  1.60* 1.47* 1.49*  CALCIUM 8.5*   < >  --  8.6* 8.4* 8.5*  PROT 6.1*  --   --   --   --   --   ALBUMIN 2.7*  --  3.0*  --   --   --   AST 21  --   --   --   --   --   ALT 19  --   --   --   --   --   ALKPHOS 48  --   --   --   --   --  BILITOT 1.1  --   --   --   --   --   GFRNONAA 54*   < >  --  45* 50* 49*  ANIONGAP 11   < >  --  _0 < > = values in this interval not displayed.    Magnesium  Date Value Ref Range Status  04/28/2020 2.2 1.7 - 2.4 mg/dL Final    Comment:    Performed at [facility], [place] [address] [place], [place] [address]    Hematology Recent Labs  Lab 04/27/20 [phone] 04/28/20 0300 04/29/20 0336  WBC 3.7* 3.8* 3.1*  RBC 4.15* 4.53 4.17*  HGB 11.2* 12.1* 11.4*  HCT 36.5* 39.7 36.9*  MCV 88.0 87.6 88.5  MCH 27.0 26.7 27.3  MCHC 30.7 30.5 30.9  RDW 18.0* 18.0* 17.7*  PLT 192 170 165    BNP Recent Labs  Lab 04/26/20 1319 04/30/20 0248  BNP 1,824.4* 1,685.5*    ANCA titer pending Ds DNA AB 1 (<5) C4 complement 15 (12-38) C3 complement 85 (82-167)   Radiology   CHEST CT: 04/26/2020  IMPRESSION: 1. Again noted is poor opacification of the bilateral lower lobe pulmonary arteries. Differential considerations include underlying pulmonary emboli as before versus suboptimal opacification secondary to contrast bolus timing. 2. Lungs/Pleura: There is a large right-sided pleural effusion. Emphysematous changes are noted bilaterally. There is a 1.1 cm pulmonary nodule in the left upper lobe which has increased in size from the prior study (axial series 6, image 27). There is a ground-glass airspace opacity in the left upper  lobe measuring approximately 2.1 cm which has increased in size from the prior study when it measured approximately 1.3 cm (axial series 6, image 55) parent there is diffuse bronchial wall thickening and mucus plugging bilaterally. There is atelectasis versus consolidation in the right lower lobe. There is a 9 mm pulmonary nodule in the right middle lobe, increased in size from prior study (axial series 6, image 86). There is a small left-sided pleural effusion.  3. Cardiomegaly with reflux of contrast in the IVC consistent with underlying cardiac dysfunction. 4. Bilateral bronchial wall thickening and mucus plugging consistent with infectious or reactive bronchiolitis. 5. Growing bilateral pulmonary nodules. While these may be infectious or inflammatory in etiology, malignancy is not excluded. Pulmonary medicine consultation is recommended. 6. Small volume ascites in the upper abdomen.  ECHOCARDIOGRAM LIMITED  Result Date: 04/28/2020    ECHOCARDIOGRAM LIMITED REPORT   [name] Date of Exam: 04/28/2020 Medical Rec #:  [id number]    Height:       72.0 in Accession #:    [id number]   Weight:       137.8 lb Date of Birth:  06-Jun-1945    BSA:          1.819 m Patient Age:    74 years     BP:           100/77 mmHg Patient Gender: M            HR:           66 bpm. Exam Location:  Inpatient Procedure: Limited Echo, Limited Color Doppler, Cardiac Doppler and Intracardiac            Opacification Agent Indications:    Dyspnea 786.09 / R06.00  History:        Patient has prior history of Echocardiogram examinations, most  recent 03/23/2020. CHF, CAD, Prior CABG, Arrythmias:Atrial                 Fibrillation; Risk Factors:Hypertension. Lupus.  Sonographer:    Darlina Sicilian RDCS Referring Phys: [place]  1. Left ventricular ejection fraction, by estimation, is 30 to 35%. The left ventricle has moderately decreased function. The left ventricle  demonstrates global hypokinesis. The left ventricular internal cavity size was mildly dilated. Left ventricular diastolic parameters are consistent with Grade III diastolic dysfunction (restrictive).  2. Right ventricular systolic function is mildly reduced. The right ventricular size is moderately enlarged. There is mildly elevated pulmonary artery systolic pressure. The estimated right ventricular systolic pressure is 74.1 mmHg.  3. Left atrial size was moderately dilated.  4. Right atrial size was severely dilated.  5. The mitral valve is degenerative. Severe mitral valve regurgitation. Moderate mitral annular calcification.  6. Tricuspid valve regurgitation is moderate.  7. The aortic valve is tricuspid. Aortic valve regurgitation is mild. Mild aortic valve sclerosis is present, with no evidence of aortic valve stenosis. Comparison(s): EF is reduced. Mitral valve regurgitation is now severe. FINDINGS  Left Ventricle: Left ventricular ejection fraction, by estimation, is 30 to 35%. The left ventricle has moderately decreased function. The left ventricle demonstrates global hypokinesis. Definity contrast agent was given IV to delineate the left ventricular endocardial borders. The left ventricular internal cavity size was mildly dilated. Left ventricular diastolic parameters are consistent with Grade III diastolic dysfunction (restrictive). The ratio of pulmonic flow to systemic flow (Qp/Qs ratio) is 0.70.  LV Wall Scoring: The mid and distal anterior wall, entire apex, mid anterolateral segment, and mid inferoseptal segment are akinetic. The mid inferolateral segment and mid inferior segment are hypokinetic. Right Ventricle: The right ventricular size is moderately enlarged. Right ventricular systolic function is mildly reduced. There is mildly elevated pulmonary artery systolic pressure. The tricuspid regurgitant velocity is 2.55 m/s, and with an assumed right atrial pressure of 15 mmHg, the estimated right  ventricular systolic pressure is 63.8 mmHg. Left Atrium: Left atrial size was moderately dilated. Right Atrium: Right atrial size was severely dilated. Mitral Valve: The mitral valve is degenerative in appearance. Moderate mitral annular calcification. Severe mitral valve regurgitation. Tricuspid Valve: Tricuspid valve regurgitation is moderate. Aortic Valve: The aortic valve is tricuspid. Aortic valve regurgitation is mild. Mild aortic valve sclerosis is present, with no evidence of aortic valve stenosis. IAS/Shunts: The ratio of pulmonic flow to systemic flow (Qp/Qs ratio) is 0.70. LEFT VENTRICLE PLAX 2D LVIDd:         5.70 cm      Diastology LVIDs:         4.40 cm      LV e' medial:    5.56 cm/s LV PW:         0.90 cm      LV E/e' medial:  17.2 LV IVS:        1.00 cm      LV e' lateral:   8.72 cm/s LVOT diam:     1.90 cm      LV E/e' lateral: 10.9 LV SV:         22 LV SV Index:   12 LVOT Area:     2.84 cm  LV Volumes (MOD) LV vol d, MOD A2C: 260.0 ml LV vol d, MOD A4C: 186.0 ml LV vol s, MOD A2C: 157.0 ml LV vol s, MOD A4C: 110.0 ml LV SV MOD A2C:  103.0 ml LV SV MOD A4C:     186.0 ml LV SV MOD BP:      84.8 ml RIGHT VENTRICLE RVOT diam:      2.10 cm LEFT ATRIUM         Index LA diam:    4.20 cm 2.31 cm/m  AORTIC VALVE             PULMONIC VALVE LVOT Vmax:   50.90 cm/s  RVOT Peak grad: 1 mmHg LVOT Vmean:  37.900 cm/s LVOT VTI:    0.079 m  AORTA Ao Asc diam: 3.40 cm MITRAL VALVE                 TRICUSPID VALVE MV Area (PHT): 5.60 cm      TR Peak grad:   26.0 mmHg MV Decel Time: 136 msec      TR Vmax:        255.00 cm/s MR Peak grad:    59.6 mmHg MR Mean grad:    41.0 mmHg   SHUNTS MR Vmax:         386.00 cm/s Systemic VTI:  0.08 m MR Vmean:        304.0 cm/s  Systemic Diam: 1.90 cm MR PISA:         4.02 cm    Pulmonic VTI:  0.046 m MR PISA Eff ROA: 39 mm      Pulmonic Diam: 2.10 cm MR PISA Radius:  0.80 cm     Qp/Qs:         0.71 MV E velocity: 95.30 cm/s Candee Furbish MD Electronically signed by Candee Furbish  MD Signature Date/Time: 04/28/2020/4:17:00 PM    Final     Cardiac Studies   ECHO: 04/28/2020 1. Left ventricular ejection fraction, by estimation, is 30 to 35%. The left ventricle has moderately decreased function. The left ventricle demonstrates global hypokinesis. The left ventricular internal cavity size was mildly dilated. Left ventricular diastolic parameters are consistent with Grade III diastolic dysfunction (restrictive).  2. Right ventricular systolic function is mildly reduced. The right  ventricular size is moderately enlarged. There is mildly elevated  pulmonary artery systolic pressure. The estimated right ventricular  systolic pressure is 42.3 mmHg.  3. Left atrial size was moderately dilated.  4. Right atrial size was severely dilated.  5. The mitral valve is degenerative. Severe mitral valve regurgitation.  Moderate mitral annular calcification.  6. Tricuspid valve regurgitation is moderate.  7. The aortic valve is tricuspid. Aortic valve regurgitation is mild.  Mild aortic valve sclerosis is present, with no evidence of aortic valve  stenosis.   Comparison(s): EF is reduced. Mitral valve regurgitation is now severe.   Patient Profile     74 y.o. male with a hx of ischemic cardiomyopathy recent CABG in May, HFrEF, PAF on Eliquis, recent PE, lupus w/ ILD and pleural effusion who was admitted 11/23 with unintentional wt loss 20 lbs, lg R pleural effusion, edema.  Assessment & Plan    Acute on chronic systolic and diastolic CHF EF 53-61% With Lupus, Query of interstitial lung disease, a R sided pleural effusion s/p BAL 04/29/20 Severe MR Severe TR  AKI on CKD Stage III - s/p diuresis with 5 kg weight loss, repeat CXR shows he is recruiting fluid again in the right lung. Will give additional 40 mg IV lasix today.  - bp will not allow initiation of HF meds at this time, suspect this is low output heart failure. Would recommend PICC line  today and check co-ox -  may need to start inotropes.  lg R pleural effusion - s/p tap w/ 1.1 L out - f/u CXR w/ no PTX  Pulm nodules - enlarging, see CT report above - s/p bronch  CAD - no ischemic sx - on ASA, statin - HR 50s at times and SBP 100s, no BB  PAF - on eliquis at home - would return when OK per pulm/primary - no BB as above - presently in sinus  PE -AC as above  For questions or updates, please contact [facility] Please consult [email or web address] for contact info under   Pixie Casino, MD, FACC, [place] Director of the [facility]  [facility] Diplomate of the American Board of Clinical Lipidology Attending Cardiologist  Direct Dial: [phone]  Fax: [phone]  Website:  [email or web address]  Pixie Casino, MD  04/30/2020, 10:10 AM

## 2020-04-30 NOTE — Progress Notes (Signed)
Progress Note    Cody Arellano  TIW:[id number] DOB: [birth year]  DOA: 04/26/2020 PCP: Venia Carbon, MD    Brief Narrative:     Medical records reviewed and are as summarized below:  Cody Arellano is an 74 y.o. male with medical history significant forlupus, interstitial lung disease, coronary artery disease, chronic systolic CHF, paroxysmal atrial fibrillation on Eliquis, and recent PEwho presents to the emergency department due to shortness of breath.  THis is his 3rd hospitalization in the last 2 months for the same/similar issues.    Assessment/Plan:   Principal Problem:   Pleural effusion on right Active Problems:   CAD (coronary artery disease)   Hyperlipemia   Paroxysmal atrial fibrillation (HCC)   Heart failure, systolic, chronic (HCC)   Pulmonary embolism (HCC)   Acute on chronic systolic CHF (congestive heart failure) (HCC)   Shortness of breath   Elevated brain natriuretic peptide (BNP) level   AKI (acute kidney injury) (HCC)   Pulmonary nodules   Nausea & vomiting   Dyspnea possibly secondary to multifactorial: lupus, right pleural effusion,CHF, pulmonary embolism  -C-T angiography of chest showed large right-sided pleural effusion and small left-sided pleural effusion also noted.   -S/p diagnostic and therapeutic thoracentesis with 1.1 L retrieval:  -sats low 90s on RA -daily weights/I/Os -cards consult appreciated: PICC Line and possible inotropes -echo: EF is reduced. Mitral valve regurgitation is now severe  ? PNA -check pro-calcitonin -start IV abx -xray: Patchy airspace opacity in the lung bases, most likely due to multifocal pneumonia.  Nausea and vomiting: intermittant Continue IV Zofran p.r.n. compazine x 1  Hypokalemia  -repleted  Pulmonary nodules CT angiography of chest showed growing bilateral pulmonary nodules suspected to be due to infectious or inflammatory in etiology, malignancy is not excluded -consult from PCCM -s/p  bronch 11/26- multiple studies/cultures pending -has follow up in the [place] -had CABG in May  Acute kidney injury:  -Presented with creatinine of 1.4.   -At baseline his creatinine is normal.   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 Continue Plaquenil and Imuran    Family Communication/Anticipated D/C date and plan/Code Status   DVT prophylaxis: heparin Code Status: Full Code.  Family Communication: wife at bedside Disposition Plan: Status is: Inpatient  Remains inpatient appropriate because:Inpatient level of care appropriate due to severity of illness   Dispo: The patient is from: Home              Anticipated d/c is to: Home              Anticipated d/c date is: > 3 days              Patient currently is not medically stable to d/c. cards eval, bronch in AM per PCCM         Medical Consultants:    Cards  IR  PCCM     Subjective:   +cough and nausea  Objective:    Vitals:   04/30/20 0405 04/30/20 0600 04/30/20 0903 04/30/20 1000  BP: (!) 85/56 102/70 (!) 88/66   Pulse:  64 65   Resp:  20    Temp:  97.8 F (36.6 C) 98.3 F (36.8 C)  TempSrc:  Oral Oral   SpO2:   97%   Weight:    62.9 kg  Height:        Intake/Output Summary (Last 24 hours) at 04/30/2020 1255 Last data filed at 04/30/2020 0545 Gross per 24 hour  Intake 1570.62 ml  Output 175 ml  Net 1395.62 ml   Filed Weights   04/29/20 0600 04/29/20 1126 04/30/20 1000  Weight: 61.9 kg 61.9 kg 62.9 kg    Exam:   General: Appearance:    Frail male in no acute distress     Lungs:    rales b/l, respirations unlabored  Heart:    Normal heart rate. No murmurs, rubs, or gallops.   MS:    All extremities are intact.   Neurologic:   Awake, alert, oriented x 3. No apparent focal neurological           defect.      Data Reviewed:   I have personally reviewed following labs and imaging studies:  Labs: Labs show the following:   Basic Metabolic Panel: Recent Labs  Lab 04/26/20 1319 04/26/20 1319 04/27/20 0337 04/27/20 0337 04/28/20 1200 04/28/20 1200 04/29/20 0851 04/30/20 0248  NA 141  --  140  --  139  --  139 139  K 3.7   < > 3.0*   < > 4.0   < > 3.7 3.7  CL 101  --  102  --  102  --  102 101  CO2 27  --  27  --  25  --  25 28  GLUCOSE 96  --  85  --  130*  --  68* 77  BUN 18  --  17  --  21  --  20 21  CREATININE 1.45*  --  1.37*  --  1.60*  --  1.47* 1.49*  CALCIUM 9.0  --  8.5*  --  8.6*  --  8.4* 8.5*  MG  --   --  1.9  --  2.2  --   --   --   PHOS  --   --  4.1  --   --   --   --   --    < > = values in this interval not displayed.   GFR Estimated Creatinine Clearance: 38.7 mL/min (A) (by C-G formula based on SCr of 1.49 mg/dL (H)). Liver Function Tests: Recent Labs  Lab 04/27/20 0337 04/27/20 1125  AST 21  --   ALT 19  --   ALKPHOS 48  --   BILITOT 1.1  --   PROT 6.1*  --   ALBUMIN 2.7* 3.0*   No results for input(s): LIPASE, AMYLASE in the last 168 hours. No results for input(s): AMMONIA in the last 168 hours. Coagulation profile Recent Labs  Lab 04/27/20 0337  INR 2.1*    CBC: Recent Labs  Lab 04/26/20 1319 04/27/20 0337 04/28/20 0300 04/29/20 0336  WBC 4.1 3.7* 3.8* 3.1*  HGB 11.9* 11.2* 12.1* 11.4*  HCT 40.1 36.5* 39.7 36.9*  MCV 89.9 88.0 87.6 88.5  PLT 187 192 170 165   Cardiac Enzymes: No results for input(s): CKTOTAL, CKMB, CKMBINDEX, TROPONINI in the last 168 hours. BNP (last 3 results) No results for input(s): PROBNP in the last 8760 hours. CBG: No results for input(s): GLUCAP in the last 168 hours. D-Dimer: No results for input(s): DDIMER in the last 72 hours. Hgb A1c: No results for input(s): HGBA1C in the  last 72  hours. Lipid Profile: No results for input(s): CHOL, HDL, LDLCALC, TRIG, CHOLHDL, LDLDIRECT in the last 72 hours. Thyroid function studies: No results for input(s): TSH, T4TOTAL, T3FREE, THYROIDAB in the last 72 hours.  Invalid input(s): FREET3 Anemia work up: No results for input(s): VITAMINB12, FOLATE, FERRITIN, TIBC, IRON, RETICCTPCT in the last 72 hours. Sepsis Labs: Recent Labs  Lab 04/26/20 1319 04/27/20 0337 04/28/20 0300 04/29/20 0336  WBC 4.1 3.7* 3.8* 3.1*    Microbiology Recent Results (from the past 240 hour(s))  Respiratory Panel by RT PCR (Flu A&B, Covid) - Nasopharyngeal Swab     Status: None   Collection Time: 04/26/20  3:06 PM   Specimen: Nasopharyngeal Swab; Nasopharyngeal(NP) swabs in vial transport medium  Result Value Ref Range Status   SARS Coronavirus 2 by RT PCR NEGATIVE NEGATIVE Final    Comment: (NOTE) SARS-CoV-2 target nucleic acids are NOT DETECTED.  The SARS-CoV-2 RNA is generally detectable in upper respiratoy specimens during the acute phase of infection. The lowest concentration of SARS-CoV-2 viral copies this assay can detect is 131 copies/mL. A negative result does not preclude SARS-Cov-2 infection and should not be used as the sole basis for treatment or other patient management decisions. A negative result may occur with  improper specimen collection/handling, submission of specimen other than nasopharyngeal swab, presence of viral mutation(s) within the areas targeted by this assay, and inadequate number of viral copies (<131 copies/mL). A negative result must be combined with clinical observations, patient history, and epidemiological information. The expected result is Negative.  Fact Sheet for Patients:  PinkCheek.be  Fact Sheet for Healthcare Providers:  GravelBags.it  This test is no t yet approved or cleared by the [place] FDA and  has been authorized for  detection and/or diagnosis of SARS-CoV-2 by FDA under an Emergency Use Authorization (EUA). This EUA will remain  in effect (meaning this test can be used) for the duration of the COVID-19 declaration under Section 564(b)(1) of the Act, 21 U.S.C. section 360bbb-3(b)(1), unless the authorization is terminated or revoked sooner.     Influenza A by PCR NEGATIVE NEGATIVE Final   Influenza B by PCR NEGATIVE NEGATIVE Final    Comment: (NOTE) The Xpert Xpress SARS-CoV-2/FLU/RSV assay is intended as an aid in  the diagnosis of influenza from Nasopharyngeal swab specimens and  should not be used as a sole basis for treatment. Nasal washings and  aspirates are unacceptable for Xpert Xpress SARS-CoV-2/FLU/RSV  testing.  Fact Sheet for Patients: PinkCheek.be  Fact Sheet for Healthcare Providers: GravelBags.it  This test is not yet approved or cleared by the [place] FDA and  has been authorized for detection and/or diagnosis of SARS-CoV-2 by  FDA under an Emergency Use Authorization (EUA). This EUA will remain  in effect (meaning this test can be used) for the duration of the  Covid-19 declaration under Section 564(b)(1) of the Act, 21  U.S.C. section 360bbb-3(b)(1), unless the authorization is  terminated or revoked. Performed at [facility], [place] [address], [place], [place] [address]   Gram stain     Status: None   Collection Time: 04/27/20  9:43 AM   Specimen: Lung, Right; Pleural Fluid  Result Value Ref Range Status   Specimen Description PLEURAL FLUID  Final   Special Requests RIGHT LUNG  Final   Gram Stain   Final    CYTOSPIN SMEAR WBC PRESENT,BOTH PMN AND MONONUCLEAR NO ORGANISMS SEEN Performed at [facility], [place] [address] [place], [place]  [address]    Report Status 04/27/2020 FINAL  Final  Fungus Culture With Stain     Status: None (Preliminary result)   Collection Time: 04/27/20  9:43 AM    Specimen: Lung, Right; Pleural Fluid  Result Value Ref Range Status   Fungus Stain Final report  Final    Comment: (NOTE) Performed At: [facility] [place], [place] [id number] Rush Farmer MD [id number]    Fungus (Mycology) Culture PENDING  Incomplete   Fungal Source PLEURAL  Final    Comment: FLUID RIGHT LUNG Performed at [facility], [place] [address], [place], [place] [address]   Acid Fast Smear (AFB)     Status: None   Collection Time: 04/27/20  9:43 AM   Specimen: Lung, Right; Pleural Fluid  Result Value Ref Range Status   AFB Specimen Processing Concentration  Final   Acid Fast Smear Negative  Final    Comment: (NOTE) Performed At: [facility] [place], [place] [id number] Rush Farmer MD [id number]    Source (AFB) PLEURAL  Final    Comment: FLUID RIGHT LUNG Performed at [facility], [place] [address] [place], [place] [address]   Culture, body fluid-bottle     Status: None (Preliminary result)   Collection Time: 04/27/20  9:43 AM   Specimen: Pleura  Result Value Ref Range Status   Specimen Description PLEURAL FLUID  Final   Special Requests RIGHT LUNG  Final   Culture   Final    NO GROWTH 3 DAYS Performed at [place] [address], [place], [place] [address]    Report Status PENDING  Incomplete  Fungus Culture Result     Status: None   Collection Time: 04/27/20  9:43 AM  Result Value Ref Range Status   Result 1 Comment  Final    Comment: (NOTE) KOH/Calcofluor preparation:  no fungus observed. Performed At: [facility] [place], [place] [id number] Rush Farmer MD [id number]   Culture, respiratory     Status: None (Preliminary result)   Collection Time: 04/29/20 12:21 PM   Specimen: Bronchial Alveolar Lavage; Respiratory  Result Value Ref Range Status   Specimen Description BRONCHIAL ALVEOLAR LAVAGE  Final   Special Requests NONE  Final   Gram Stain NO  WBC SEEN NO ORGANISMS SEEN   Final   Culture   Final    NO GROWTH < 24 HOURS Performed at [facility], [place] [address] [place], [place] [address]    Report Status PENDING  Incomplete  Culture, respiratory     Status: None (Preliminary result)   Collection Time: 04/29/20 12:28 PM   Specimen: Bronchial Alveolar Lavage; Respiratory  Result Value Ref Range Status   Specimen Description BRONCHIAL ALVEOLAR LAVAGE  Final   Special Requests RML LUNG SPEC B  Final   Gram Stain PENDING  Incomplete   Culture   Final    NO GROWTH < 24 HOURS Performed at [facility], [address] [place], [place] [address]    Report Status PENDING  Incomplete    Procedures and diagnostic studies:  DG CHEST PORT 1 VIEW  Result Date: 04/30/2020 CLINICAL DATA:  Cough and shortness of breath EXAM: PORTABLE CHEST 1 VIEW COMPARISON:  April 27, 2020 chest radiograph and chest CT April 26, 2020 FINDINGS: There is an apparent skin fold on the left. No pneumothorax is appreciable. There is patchy airspace opacity in the lower lung regions,  slightly more on the right than the left. There is cardiomegaly with pulmonary venous hypertension. Patient is status post coronary artery bypass grafting. A left atrial appendage clamp is noted. There is aortic atherosclerosis. No adenopathy. There is thoracic dextroscoliosis. Evidence of old trauma involving the left clavicle. IMPRESSION: Patchy airspace opacity in the lung bases, most likely due to multifocal pneumonia. There is a degree of underlying cardiomegaly with pulmonary vascular congestion. There may be superimposed degree of pulmonary edema. Note that both edema and pneumonia may present concurrently. Postoperative changes noted.  Scoliosis noted. Aortic Atherosclerosis (ICD10-I70.0). Electronically Signed   By: Lowella Grip III M.D.   On: 04/30/2020 11:05   ECHOCARDIOGRAM LIMITED  Result Date: 04/28/2020    ECHOCARDIOGRAM LIMITED REPORT   Patient  Name:   Cody Arellano Date of Exam: 04/28/2020 Medical Rec #:  [id number]    Height:       72.0 in Accession #:    [id number]   Weight:       137.8 lb Date of Birth:  [birth year]    BSA:          1.819 m Patient Age:    29 years     BP:           100/77 mmHg Patient Gender: M            HR:           66 bpm. Exam Location:  Inpatient Procedure: Limited Echo, Limited Color Doppler, Cardiac Doppler and Intracardiac            Opacification Agent Indications:    Dyspnea 786.09 / R06.00  History:        Patient has prior history of Echocardiogram examinations, most                 recent 03/23/2020. CHF, CAD, Prior CABG, Arrythmias:Atrial                 Fibrillation; Risk Factors:Hypertension. Lupus.  Sonographer:    Darlina Sicilian RDCS Referring Phys: [place]  1. Left ventricular ejection fraction, by estimation, is 30 to 35%. The left ventricle has moderately decreased function. The left ventricle demonstrates global hypokinesis. The left ventricular internal cavity size was mildly dilated. Left ventricular diastolic parameters are consistent with Grade III diastolic dysfunction (restrictive).  2. Right ventricular systolic function is mildly reduced. The right ventricular size is moderately enlarged. There is mildly elevated pulmonary artery systolic pressure. The estimated right ventricular systolic pressure is 03.5 mmHg.  3. Left atrial size was moderately dilated.  4. Right atrial size was severely dilated.  5. The mitral valve is degenerative. Severe mitral valve regurgitation. Moderate mitral annular calcification.  6. Tricuspid valve regurgitation is moderate.  7. The aortic valve is tricuspid. Aortic valve regurgitation is mild. Mild aortic valve sclerosis is present, with no evidence of aortic valve stenosis. Comparison(s): EF is reduced. Mitral valve regurgitation is now severe. FINDINGS  Left Ventricle: Left ventricular ejection fraction, by estimation, is 30 to 35%. The left ventricle  has moderately decreased function. The left ventricle demonstrates global hypokinesis. Definity contrast agent was given IV to delineate the left ventricular endocardial borders. The left ventricular internal cavity size was mildly dilated. Left ventricular diastolic parameters are consistent with Grade III diastolic dysfunction (restrictive). The ratio of pulmonic flow to systemic flow (Qp/Qs ratio) is 0.70.  LV Wall Scoring: The mid and distal anterior wall, entire apex, mid anterolateral segment, and mid  inferoseptal segment are akinetic. The mid inferolateral segment and mid inferior segment are hypokinetic. Right Ventricle: The right ventricular size is moderately enlarged. Right ventricular systolic function is mildly reduced. There is mildly elevated pulmonary artery systolic pressure. The tricuspid regurgitant velocity is 2.55 m/s, and with an assumed right atrial pressure of 15 mmHg, the estimated right ventricular systolic pressure is 76.7 mmHg. Left Atrium: Left atrial size was moderately dilated. Right Atrium: Right atrial size was severely dilated. Mitral Valve: The mitral valve is degenerative in appearance. Moderate mitral annular calcification. Severe mitral valve regurgitation. Tricuspid Valve: Tricuspid valve regurgitation is moderate. Aortic Valve: The aortic valve is tricuspid. Aortic valve regurgitation is mild. Mild aortic valve sclerosis is present, with no evidence of aortic valve stenosis. IAS/Shunts: The ratio of pulmonic flow to systemic flow (Qp/Qs ratio) is 0.70. LEFT VENTRICLE PLAX 2D LVIDd:         5.70 cm      Diastology LVIDs:         4.40 cm      LV e' medial:    5.56 cm/s LV PW:         0.90 cm      LV E/e' medial:  17.2 LV IVS:        1.00 cm      LV e' lateral:   8.72 cm/s LVOT diam:     1.90 cm      LV E/e' lateral: 10.9 LV SV:         22 LV SV Index:   12 LVOT Area:     2.84 cm  LV Volumes (MOD) LV vol d, MOD A2C: 260.0 ml LV vol d, MOD A4C: 186.0 ml LV vol s, MOD A2C: 157.0  ml LV vol s, MOD A4C: 110.0 ml LV SV MOD A2C:     103.0 ml LV SV MOD A4C:     186.0 ml LV SV MOD BP:      84.8 ml RIGHT VENTRICLE RVOT diam:      2.10 cm LEFT ATRIUM         Index LA diam:    4.20 cm 2.31 cm/m  AORTIC VALVE             PULMONIC VALVE LVOT Vmax:   50.90 cm/s  RVOT Peak grad: 1 mmHg LVOT Vmean:  37.900 cm/s LVOT VTI:    0.079 m  AORTA Ao Asc diam: 3.40 cm MITRAL VALVE                 TRICUSPID VALVE MV Area (PHT): 5.60 cm      TR Peak grad:   26.0 mmHg MV Decel Time: 136 msec      TR Vmax:        255.00 cm/s MR Peak grad:    59.6 mmHg MR Mean grad:    41.0 mmHg   SHUNTS MR Vmax:         386.00 cm/s Systemic VTI:  0.08 m MR Vmean:        304.0 cm/s  Systemic Diam: 1.90 cm MR PISA:         4.02 cm    Pulmonic VTI:  0.046 m MR PISA Eff ROA: 39 mm      Pulmonic Diam: 2.10 cm MR PISA Radius:  0.80 cm     Qp/Qs:         0.71 MV E velocity: 95.30 cm/s Candee Furbish MD Electronically signed by Candee Furbish MD Signature Date/Time: 04/28/2020/4:17:00 PM  Final    [place] EKG SITE RITE  Result Date: 04/30/2020 If Site Rite image not attached, placement could not be confirmed due to current cardiac rhythm.   Medications:   . aspirin EC  81 mg Oral Daily  . azaTHIOprine  100 mg Oral BID  . docusate sodium  100 mg Oral BID  . feeding supplement  237 mL Oral TID BM  . hydroxychloroquine  400 mg Oral QHS  . prochlorperazine  10 mg Intravenous Once  . rosuvastatin  20 mg Oral Daily  . tamsulosin  0.4 mg Oral QPM   Continuous Infusions: . azithromycin    . cefTRIAXone (ROCEPHIN)  IV    . heparin 1,000 Units/hr (04/30/20 0544)  . lactated ringers Stopped (04/29/20 1350)     LOS: 4 days   Geradine Girt  [facility]   How to contact the [facility] Attending or Consulting provider [place] or covering provider during after hours [place], for this patient?  1. Check the care team in [facility] and look for a) attending/consulting [facility] provider listed and b) the [facility] team listed 2. Log into  [email or web address] and use [place]'s universal password to access. If you do not have the password, please contact the hospital operator. 3. Locate the [facility] provider you are looking for under [facility] and page to a number that you can be directly reached. 4. If you still have difficulty reaching the provider, please page the [facility] (Director on Call) for the Hospitalists listed on amion for assistance.  04/30/2020, 12:55 PM

## 2020-04-30 NOTE — Progress Notes (Signed)
Peripherally Inserted Central Catheter Placement  The IV Nurse has discussed with the patient and/or persons authorized to consent for the patient, the purpose of this procedure and the potential benefits and risks involved with this procedure.  The benefits include less needle sticks, lab draws from the catheter, and the patient may be discharged home with the catheter. Risks include, but not limited to, infection, bleeding, blood clot (thrombus formation), and puncture of an artery; nerve damage and irregular heartbeat and possibility to perform a PICC exchange if needed/ordered by physician.  Alternatives to this procedure were also discussed.  Bard Power PICC patient education guide, fact sheet on infection prevention and patient information card has been provided to patient /or left at bedside.    PICC Placement Documentation  PICC Double Lumen 04/30/20 PICC Right Brachial 39 cm 0 cm (Active)  Indication for Insertion or Continuance of Line Chronic illness with exacerbations (CF, Sickle Cell, etc.) 04/30/20 1400  Exposed Catheter (cm) 0 cm 04/30/20 1400  Site Assessment Clean;Dry;Intact 04/30/20 1400  Lumen #1 Status Flushed;Saline locked;Blood return noted 04/30/20 1400  Lumen #2 Status Flushed;Saline locked;Blood return noted 04/30/20 1400  Dressing Type Transparent;Securing device 04/30/20 1400  Dressing Status Clean;Dry;Intact 04/30/20 1400  Antimicrobial disc in place? Yes 04/30/20 1400  Safety Lock Not Applicable 62/22/97 9892  Line Care Connections checked and tightened 04/30/20 1400  Dressing Intervention New dressing 04/30/20 1400  Dressing Change Due 05/07/20 04/30/20 1400       Holley Bouche Renee 04/30/2020, 2:08 PM

## 2020-04-30 NOTE — Progress Notes (Signed)
Cardiology follow up note  RN calling stating patient hypotensive BP 81/59, 89/68 and unable to start milrinone 0.44mcg/kg/min. Assessed patient at bedside, chronically ill appearing man, not in distress JVP++ Decreased breath sounds b/l bases, crackles+ Warm legs though  73 y.o. male with a hx of ischemic cardiomyopathy recent CABG in May, HFrEF, PAF on Eliquis, recent PE, lupus w/ ILD and pleural effusionwho was admitted 11/23 with unintentional wt loss 20 lbs, lg R pleural effusion, edema. Cardiology has been following him for acute on chronic systolic HF, EF 75-44%, severe MR, Severe TR, AKI on CKD s/p IV lasix with 5kg down. He also had large right pleural effusion s/p tap 1.1ltr out and s/p bronch. PICC line was placed and ScVo2 is low 41% with calculated FICK CI is severely low (1.2) with Hb 12.6  Plan was to start milrinone on the floor through his PICC line (right side), however patient has been hypotensive He is chronically ill, cachectic, poor albumin.  Etiology of hypotension is likely Cardiogenic. He is immunocompromised and low albumin, already got fluids in the admission and now s/p diuresis. I do not want to give albumin. Unsure if he needs stress dose steroids. procal is negative  Recommendations:  - move him to the ICU - check lactic acid  - start leveophed 88mcg/min to get his MAP up  - start milrinone 0.44mcg/kg/min and wean levophed. ?Stress dose steriods- will leave it to the critical care team. He is markedly  Volume up with JVD+ but also has severe TR. Got IV lasix 40mg  today. Would dose IV lasix 40mg  BID.  Paged Critical care team and signed out-> they will see the patient Paged [facility] and signed out-> transfer orders to ICU placed. Appreciate help.   Renae Fickle, MD Cardiology

## 2020-04-30 NOTE — Plan of Care (Signed)
  Problem: Activity: Goal: Risk for activity intolerance will decrease Outcome: Progressing   Problem: Coping: Goal: Level of anxiety will decrease Outcome: Progressing   

## 2020-04-30 NOTE — Progress Notes (Signed)
Mixed venous O2 sat 41%, c/w low cardiac output. liguric. Will start IV milrinone. May also benefit from transfusion with Hgb 6.8.

## 2020-04-30 NOTE — Progress Notes (Signed)
Cardiology follow up note  Assessed the patient in CICU Laying flat, seems comfortable Total UO today is 2.5lts Started on milrinone 0.20mcg. BP improved 107/62 Levophed not used- d/c'd  Plan: continue milrinone at 0.43mcg/kg/min, obtain scVo2 in 4 hours and then daily. We will give him IV lasix tomorrow as he seems comfortable now and may diurese more with improved forward flow.  D/c levophed Continue other medical care per Critical care team

## 2020-04-30 NOTE — Progress Notes (Signed)
Called by Cardiology. Pt has hypotension with CHF and in cardiogenic shock. Cardiology requests pt be moved to ICU. Cardiology has consulted PCCM already who stated the critical care team will go evaluate the patient. Transfer order for ICU placed.  Pt placed on milrione and levophed by cardiology.

## 2020-04-30 NOTE — Progress Notes (Signed)
Pt. With orders to transfer to 2H13. Pt. Alert and stable. No distress noted at time of transfer.

## 2020-04-30 NOTE — Progress Notes (Signed)
[place] for Heparin Indication: atrial fibrillation and pulmonary embolus  Assessment: 71 yoM on apixaban PTA for hx PE (03/2020) and AFib admitted with SOB and pleural effusion. Apixaban held for thoracentesis 11/24, IV heparin started and continued pending other procedures. Transition to apixaban planned today 11/27.   APTTs have been in treatment range for 3 days, will continue apixaban treatment dose for 4 days, for a total of 7 days of treatment-dose anticoagulation. Then continue PTA dose of apixaban. CBC is stable today, no bleeding issues per nursing. Nursing aware of how to transition between medications.   Goal of Therapy:  Monitor platelets by anticoagulation protocol: Yes  Treatment of PE   Plan:  -Discontinue heparin -Initiate apixaban 10mg  twice daily for 4 days, then 5mg  twice daily -Monitor CBC and S/Sx bleeding  Thanks for allowing pharmacy to be a part of this patient's care.  Norina Buzzard, PharmD PGY1 Pharmacy Resident 04/30/2020 1:08 PM

## 2020-04-30 NOTE — Progress Notes (Signed)
[place] for Heparin Indication: atrial fibrillation and pulmonary embolus  Assessment: 75 yoM on apixaban PTA for hx PE (03/2020) and AFib admitted with SOB and pleural effusion. Apixaban held for thoracentesis 11/24, IV heparin started and continued pending other procedures.  APTT 63 sec this am  Goal of Therapy:  Heparin level 0.3-0.7 units/ml aPTT 66-102 seconds Monitor platelets by anticoagulation protocol: Yes   Plan:  -Increase heparin to 1000 units/hr. -Daily heparin level and aPTT until correlating.  -Monitor CBC and S/Sx bleeding  Thanks for allowing pharmacy to be a part of this patient's care.  Excell Seltzer, PharmD Clinical Pharmacist

## 2020-05-01 ENCOUNTER — Encounter (HOSPITAL_COMMUNITY): Payer: Self-pay | Admitting: Pulmonary Disease

## 2020-05-01 DIAGNOSIS — I480 Paroxysmal atrial fibrillation: Secondary | ICD-10-CM

## 2020-05-01 DIAGNOSIS — J90 Pleural effusion, not elsewhere classified: Secondary | ICD-10-CM | POA: Diagnosis not present

## 2020-05-01 DIAGNOSIS — I5022 Chronic systolic (congestive) heart failure: Secondary | ICD-10-CM | POA: Diagnosis not present

## 2020-05-01 DIAGNOSIS — R570 Cardiogenic shock: Secondary | ICD-10-CM | POA: Diagnosis not present

## 2020-05-01 DIAGNOSIS — I5023 Acute on chronic systolic (congestive) heart failure: Secondary | ICD-10-CM | POA: Diagnosis not present

## 2020-05-01 LAB — CULTURE, RESPIRATORY W GRAM STAIN
Culture: NO GROWTH
Culture: NO GROWTH
Gram Stain: NONE SEEN

## 2020-05-01 LAB — BASIC METABOLIC PANEL
Anion gap: 14 (ref 5–15)
Anion gap: 12 (ref 5–15)
BUN: 15 mg/dL (ref 8–23)
BUN: 14 mg/dL (ref 8–23)
CO2: 29 mmol/L (ref 22–32)
CO2: 32 mmol/L (ref 22–32)
Calcium: 8.8 mg/dL — ABNORMAL LOW (ref 8.9–10.3)
Calcium: 8.7 mg/dL — ABNORMAL LOW (ref 8.9–10.3)
Chloride: 100 mmol/L (ref 98–111)
Chloride: 98 mmol/L (ref 98–111)
Creatinine, Ser: 1.47 mg/dL — ABNORMAL HIGH (ref 0.61–1.24)
Creatinine, Ser: 1.45 mg/dL — ABNORMAL HIGH (ref 0.61–1.24)
GFR, Estimated: 50 mL/min — ABNORMAL LOW (ref >60)
GFR, Estimated: 51 mL/min — ABNORMAL LOW (ref >60)
Glucose, Bld: 100 mg/dL — ABNORMAL HIGH (ref 70–99)
Glucose, Bld: 129 mg/dL — ABNORMAL HIGH (ref 70–99)
Potassium: 2.9 mmol/L — ABNORMAL LOW (ref 3.5–5.1)
Potassium: 3 mmol/L — ABNORMAL LOW (ref 3.5–5.1)
Sodium: 143 mmol/L (ref 135–145)
Sodium: 142 mmol/L (ref 135–145)

## 2020-05-01 LAB — COOXEMETRY PANEL
Carboxyhemoglobin: 1.5 % (ref 0.5–1.5)
Carboxyhemoglobin: 1.3 % (ref 0.5–1.5)
Carboxyhemoglobin: 1.5 % (ref 0.5–1.5)
Methemoglobin: 0.7 % (ref 0.0–1.5)
Methemoglobin: 0.6 % (ref 0.0–1.5)
Methemoglobin: 0.9 % (ref 0.0–1.5)
O2 Saturation: 58.9 %
O2 Saturation: 49.1 %
O2 Saturation: 59 %
Total hemoglobin: 11 g/dL — ABNORMAL LOW (ref 12.0–16.0)
Total hemoglobin: 11.8 g/dL — ABNORMAL LOW (ref 12.0–16.0)
Total hemoglobin: 11.4 g/dL — ABNORMAL LOW (ref 12.0–16.0)

## 2020-05-01 LAB — CBC
HCT: 39.7 % (ref 39.0–52.0)
Hemoglobin: 11.6 g/dL — ABNORMAL LOW (ref 13.0–17.0)
MCH: 26.6 pg (ref 26.0–34.0)
MCHC: 29.2 g/dL — ABNORMAL LOW (ref 30.0–36.0)
MCV: 91.1 fL (ref 80.0–100.0)
Platelets: 167 10*3/uL (ref 150–400)
RBC: 4.36 MIL/uL (ref 4.22–5.81)
RDW: 17.2 % — ABNORMAL HIGH (ref 11.5–15.5)
WBC: 3.8 10*3/uL — ABNORMAL LOW (ref 4.0–10.5)
nRBC: 0 % (ref 0.0–0.2)

## 2020-05-01 LAB — MAGNESIUM: Magnesium: 2.1 mg/dL (ref 1.7–2.4)

## 2020-05-01 LAB — PROCALCITONIN: Procalcitonin: 0.15 ng/mL

## 2020-05-01 MED ORDER — MEGESTROL ACETATE 40 MG PO TABS
40.0000 mg | ORAL_TABLET | Freq: Every day | ORAL | Status: DC
  Administered 2020-05-01 – 2020-05-02 (×2): 40 mg via ORAL
  Filled 2020-05-01 (×3): qty 1

## 2020-05-01 MED ORDER — POTASSIUM CHLORIDE 20 MEQ PO PACK
40.0000 meq | PACK | Freq: Two times a day (BID) | ORAL | Status: AC
  Administered 2020-05-01 – 2020-05-02 (×3): 40 meq via ORAL
  Filled 2020-05-01 (×3): qty 2

## 2020-05-01 MED ORDER — FUROSEMIDE 10 MG/ML IJ SOLN
40.0000 mg | Freq: Two times a day (BID) | INTRAMUSCULAR | Status: DC
  Administered 2020-05-01 – 2020-05-02 (×3): 40 mg via INTRAVENOUS
  Filled 2020-05-01 (×3): qty 4

## 2020-05-01 NOTE — Plan of Care (Signed)

## 2020-05-01 NOTE — Progress Notes (Signed)
NAME:  Cody Arellano, MRN:  [id number], DOB:  [birth year], LOS: 5 ADMISSION DATE:  04/26/2020, CONSULTATION DATE:  05/01/20 REFERRING MD:  Kipp Brood, MD, CHIEF COMPLAINT:  DOE   Brief History   Patient is a 74 year old male I was asked to evaluate at bedside due to hypotension felt secondary to cardiogenic shock.  He currently is being evaluated by pulmonary critical care recurrent effusion and pulmonary nodules.  History of present illness   Patient was admitted with pleural effusion cardiomyopathy with ejection fraction of 30%, chronic A. fib and SLE on Imuran and Plaquenil.  Patient developed hypotension worse than baseline this evening.  On my evaluation the patient is awake alert interactive says he feels weak and tired.  He does not complain of dyspnea.  O2 saturation in the high 90s on room air.  He is not breathing rapidly nor using accessory muscles.  Chest x-ray shows increased interstitial markings bilaterally blood pressure is 92/70.  Hemoglobin is 11 and creatinine is stable at 1.49, platelet count 180.  Plan is for transfer to the ICU and initiation of milrinone.  BNP is 1600.  Past Medical History  CHF w/ reduced EF, mitral regurg, CAD, Afib, SLE  Significant Hospital Events   Admitted 11/23 thora 11/23  Consults:  PCCM VIR  Procedures:  Inocencio Homes 11/24  Significant Diagnostic Tests:  CT chest 11/23 - R>L bilateral pleural effusions, stable nodules largest 2 in LUL, pulmonary edema, bullous emphysematous changes scattered throughout  Pleural fluid transudate 11/24  Micro Data:  Pleural fluid culture pending  Antimicrobials:  n/a  Interim history/subjective:   Reports feeling fatigued yesterday, but feels better today now that he is on milrinone.  Acknowledges that he has been declining since he last saw his cardiologist approximately 2 to 3 months ago.  He has had a worsening in appetite.  Objective   Blood pressure 121/67, pulse 72, temperature 97.8 F (36.6  C), resp. rate (!) 21, height 6' (1.829 m), weight 62.9 kg, SpO2 97 %.        Intake/Output Summary (Last 24 hours) at 05/01/2020 1359 Last data filed at 05/01/2020 1000 Gross per 24 hour  Intake 1793.73 ml  Output 2650 ml  Net -856.27 ml   Filed Weights   04/29/20 0600 04/29/20 1126 04/30/20 1000  Weight: 61.9 kg 61.9 kg 62.9 kg    Examination: General: male, resting in bed, no distress.  Marked cachexia Eyes: Normal sclera Neck: Thyroid of normal size and consistency. Lungs: Occasional crackles at bases Cardiovascular: JVP at 5 cm above sternal angle with CV waves parasternal lift with medial retraction 2/6 holosystolic murmur.  No gallop appreciated. Abdomen: soft, nt nd  Extremities: no edema  Neuro: alert, follows commands no focal deficits. 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 Debara Pickett Dietary consult for nutritional support as cachexia contributes to worsen prognosis in heart failure May need to consider goals of care given likely limited therapeutic  options given age.   Daily Goals Checklist  Pain/Anxiety/Delirium protocol (if indicated): None required VAP protocol (if indicated): Not intubated Respiratory support goals: Currently on room air Blood pressure target: Titrate milrinone to keep MAP greater  than 65 and SCV O2 greater than 60 DVT prophylaxis: On apixaban Nutrition Status: High nutritional risk, dietary consult.  Trial of Megace GI prophylaxis: Not indicated Fluid status goals: We will diurese today Urinary catheter: Condom catheter Central lines: PICC line Glucose control: Currently euglycemic on no therapy Mobility/therapy needs: PT consult Antibiotic de-escalation: We will stop antibiotics as no signs of pneumonia Home medication reconciliation: Home immunosuppressive's reordered Daily labs: CBC, BNP, BMP Code Status: Full code, may need to readdress this admission. Family Communication: Updated patient himself. Disposition: ICU.   Labs   CBC: Recent Labs  Lab 04/27/20 0337 04/28/20 0300 04/29/20 0336 04/30/20 0248 05/01/20 0528  WBC 3.7* 3.8* 3.1* 3.8* 3.8*  HGB 11.2* 12.1* 11.4* 12.6* 11.6*  HCT 36.5* 39.7 36.9* 43.2 39.7  MCV 88.0 87.6 88.5 92.1 91.1  PLT 192 170 165 180 818    Basic Metabolic Panel: Recent Labs  Lab 04/27/20 0337 04/28/20 1200 04/29/20 0851 04/30/20 0248 05/01/20 0528  NA 140 139 139 139 143  K 3.0* 4.0 3.7 3.7 2.9*  CL 102 102 102 101 100  CO2 27 25 25 28 29   GLUCOSE 85 130* 68* 77 100*  BUN 17 21 20 21 15   CREATININE 1.37* 1.60* 1.47* 1.49* 1.47*  CALCIUM 8.5* 8.6* 8.4* 8.5* 8.8*  MG 1.9 2.2  --   --  2.1  PHOS 4.1  --   --   --   --    GFR: Estimated Creatinine Clearance: 39.2 mL/min (A) (by C-G formula based on SCr of 1.47 mg/dL (H)). Recent Labs  Lab 04/28/20 0300 04/29/20 0336 04/30/20 0248 04/30/20 2038 05/01/20 0528  PROCALCITON  --   --  0.18  --  0.15  WBC 3.8* 3.1* 3.8*  --  3.8*  LATICACIDVEN  --   --   --  1.8  --     Liver Function Tests: Recent Labs  Lab 04/27/20 0337 04/27/20 1125  AST 21  --   ALT 19  --   ALKPHOS 48  --   BILITOT 1.1  --   PROT 6.1*  --   ALBUMIN 2.7* 3.0*   No results for input(s): LIPASE, AMYLASE in the last 168 hours. No results for input(s): AMMONIA in the last 168  hours.  ABG    Component Value Date/Time   PHART 7.371 10/18/2019 0224   PCO2ART 35.4 10/18/2019 0224   PO2ART 113 (H) 10/18/2019 0224   HCO3 20.5 10/18/2019 0224   TCO2 23 11/12/2019 1730   ACIDBASEDEF 4.0 (H) 10/18/2019 0224   O2SAT 49.1 05/01/2020 0526     Coagulation Profile: Recent Labs  Lab 04/27/20 0337  INR 2.1*    Cardiac Enzymes: No results for input(s): CKTOTAL, CKMB, CKMBINDEX, TROPONINI in the last 168 hours.  HbA1C: Hgb A1c MFr Bld  Date/Time Value Ref Range Status  10/17/2019 12:05 PM 5.7 (H) 4.8 - 5.6 % Final    Comment:    (NOTE) Pre diabetes:          5.7%-6.4% Diabetes:              >6.4% Glycemic control for   <7.0% adults with diabetes     CRITICAL CARE Performed by: Kipp Brood   Total critical care time: 40 minutes  Critical care time was exclusive of separately billable procedures and treating other patients.  Critical care was necessary to treat or prevent imminent or life-threatening deterioration.  Critical care was time spent personally by me on the following activities: development of treatment plan with patient and/or surrogate as well as nursing, discussions with consultants, evaluation of patient's response to treatment, examination of patient, obtaining history from patient or surrogate, ordering and performing treatments and interventions, ordering and review of laboratory studies, ordering and review of radiographic studies, pulse oximetry, re-evaluation of patient's condition and participation in multidisciplinary rounds.  Kipp Brood, MD [facility] ICU Physician [place]  Pager: [phone] Mobile: [phone] After hours: [phone].   05/01/2020 1:59 PM

## 2020-05-01 NOTE — Progress Notes (Addendum)
Progress Note  Patient Name: Cody Arellano Date of Encounter: 05/01/2020  [facility] Cardiologist: Mertie Moores, MD   Subjective   Feels well today. PICC placed yesterday with concern for low output heart failure. COOX was 41.4 - started on milrinone yesterday, but became progressively more hypotensive and was transferred to the ICU for levophed, ultimately, however, was able to keep him on milrinone, now at 0.375 mcg/kg/min. Overnight, COOX improved to 58,9 and 49.1 this morning. Creatinine stable. Was able to make a small amount of urine (-783 cc), weight down 1KG.  Inpatient Medications    Scheduled Meds: . apixaban  10 mg Oral BID   Followed by  . [START ON 05/04/2020] apixaban  5 mg Oral BID  . aspirin EC  81 mg Oral Daily  . azaTHIOprine  100 mg Oral BID  . Chlorhexidine Gluconate Cloth  6 each Topical Daily  . docusate sodium  100 mg Oral BID  . feeding supplement  237 mL Oral TID BM  . hydroxychloroquine  400 mg Oral QHS  . potassium chloride  40 mEq Oral BID  . rosuvastatin  20 mg Oral Daily  . sodium chloride flush  10-40 mL Intracatheter Q12H  . tamsulosin  0.4 mg Oral QPM   Continuous Infusions: . sodium chloride 5 mL/hr at 05/01/20 0900  . azithromycin Stopped (04/30/20 1901)  . cefTRIAXone (ROCEPHIN)  IV 2 g (04/30/20 1442)  . lactated ringers 20 mL/hr at 05/01/20 0900  . milrinone 0.375 mcg/kg/min (05/01/20 1034)   PRN Meds: bisacodyl, lidocaine, ondansetron (ZOFRAN) IV, oxyCODONE, polyethylene glycol, sodium chloride flush   Vital Signs    Vitals:   05/01/20 0600 05/01/20 0700 05/01/20 0800 05/01/20 0820  BP: 109/67 104/62 121/67   Pulse: 61 61 72   Resp: 16 (!) 31 (!) 21   Temp:    97.8 F (36.6 C)  TempSrc:      SpO2: 91% 91% 97%   Weight:      Height:        Intake/Output Summary (Last 24 hours) at 05/01/2020 1039 Last data filed at 05/01/2020 0900 Gross per 24 hour  Intake 2084.05 ml  Output 3100 ml  Net -1015.95 ml   Last 3  Weights 04/30/2020 04/29/2020 04/29/2020  Weight (lbs) 138 lb 10.7 oz 136 lb 7.4 oz 136 lb 8 oz  Weight (kg) 62.9 kg 61.9 kg 61.916 kg      Telemetry    SR with ventricular bigeminy - Personally Reviewed  ECG    N/A  Physical Exam   GEN: No acute distress.   Neck: JVD 8 cm Cardiac: RRR, +3/6 blowing murmur at apex, no rubs, or gallops, +RV heave Respiratory: decreased BS R, rales L base  GI: Soft, nontender, non-distended  MS: No edema; No deformity. Neuro:  Nonfocal  Psych: Normal affect   Labs    High Sensitivity Troponin:   Recent Labs  Lab 04/06/20 2134 04/06/20 2341  TROPONINIHS 28* 25*      Chemistry Recent Labs  Lab 04/27/20 0337 04/27/20 1125 04/28/20 1200 04/29/20 0851 04/30/20 0248 05/01/20 0528  NA 140  --    < > 139 139 143  K 3.0*  --    < > 3.7 3.7 2.9*  CL 102  --    < > 102 101 100  CO2 27  --    < > _0 GLUCOSE 85  --    < > 68* 77 100*  BUN 17  --    < >  _0 CREATININE 1.37*  --    < > 1.47* 1.49* 1.47*  CALCIUM 8.5*  --    < > 8.4* 8.5* 8.8*  PROT 6.1*  --   --   --   --   --   ALBUMIN 2.7* 3.0*  --   --   --   --   AST 21  --   --   --   --   --   ALT 19  --   --   --   --   --   ALKPHOS 48  --   --   --   --   --   BILITOT 1.1  --   --   --   --   --   GFRNONAA 54*  --    < > 50* 49* 50*  ANIONGAP 11  --    < > _1 < > = values in this interval not displayed.    Magnesium  Date Value Ref Range Status  05/01/2020 2.1 1.7 - 2.4 mg/dL Final    Comment:    Performed at [facility], [place] [address], [place], [place] [address]    Hematology Recent Labs  Lab 04/29/20 [phone] 04/30/20 0248 05/01/20 0528  WBC 3.1* 3.8* 3.8*  RBC 4.17* 4.69 4.36  HGB 11.4* 12.6* 11.6*  HCT 36.9* 43.2 39.7  MCV 88.5 92.1 91.1  MCH 27.3 26.9 26.6  MCHC 30.9 29.2* 29.2*  RDW 17.7* 18.0* 17.2*  PLT 165 180 167    BNP Recent Labs  Lab 04/26/20 1319 04/30/20 0248  BNP 1,824.4* 1,685.5*    ANCA titer pending Ds DNA  AB 1 (<5) C4 complement 15 (12-38) C3 complement 85 (82-167)   Radiology   CHEST CT: 04/26/2020  IMPRESSION: 1. Again noted is poor opacification of the bilateral lower lobe pulmonary arteries. Differential considerations include underlying pulmonary emboli as before versus suboptimal opacification secondary to contrast bolus timing. 2. Lungs/Pleura: There is a large right-sided pleural effusion. Emphysematous changes are noted bilaterally. There is a 1.1 cm pulmonary nodule in the left upper lobe which has increased in size from the prior study (axial series 6, image 27). There is a ground-glass airspace opacity in the left upper lobe measuring approximately 2.1 cm which has increased in size from the prior study when it measured approximately 1.3 cm (axial series 6, image 55) parent there is diffuse bronchial wall thickening and mucus plugging bilaterally. There is atelectasis versus consolidation in the right lower lobe. There is a 9 mm pulmonary nodule in the right middle lobe, increased in size from prior study (axial series 6, image 86). There is a small left-sided pleural effusion.  3. Cardiomegaly with reflux of contrast in the IVC consistent with underlying cardiac dysfunction. 4. Bilateral bronchial wall thickening and mucus plugging consistent with infectious or reactive bronchiolitis. 5. Growing bilateral pulmonary nodules. While these may be infectious or inflammatory in etiology, malignancy is not excluded. Pulmonary medicine consultation is recommended. 6. Small volume ascites in the upper abdomen.  DG CHEST PORT 1 VIEW  Result Date: 04/30/2020 CLINICAL DATA:  Cough and shortness of breath EXAM: PORTABLE CHEST 1 VIEW COMPARISON:  April 27, 2020 chest radiograph and chest CT April 26, 2020 FINDINGS: There is an apparent skin fold on the left. No pneumothorax is appreciable. There is patchy airspace opacity in the lower lung regions, slightly more on the right  than the left. There  is cardiomegaly with pulmonary venous hypertension. Patient is status post coronary artery bypass grafting. A left atrial appendage clamp is noted. There is aortic atherosclerosis. No adenopathy. There is thoracic dextroscoliosis. Evidence of old trauma involving the left clavicle. IMPRESSION: Patchy airspace opacity in the lung bases, most likely due to multifocal pneumonia. There is a degree of underlying cardiomegaly with pulmonary vascular congestion. There may be superimposed degree of pulmonary edema. Note that both edema and pneumonia may present concurrently. Postoperative changes noted.  Scoliosis noted. Aortic Atherosclerosis (ICD10-I70.0). Electronically Signed   By: Lowella Grip III M.D.   On: 04/30/2020 11:05   [place] EKG SITE RITE  Result Date: 04/30/2020 If Site Rite image not attached, placement could not be confirmed due to current cardiac rhythm.   Cardiac Studies   ECHO: 04/28/2020 1. Left ventricular ejection fraction, by estimation, is 30 to 35%. The left ventricle has moderately decreased function. The left ventricle demonstrates global hypokinesis. The left ventricular internal cavity size was mildly dilated. Left ventricular diastolic parameters are consistent with Grade III diastolic dysfunction (restrictive).  2. Right ventricular systolic function is mildly reduced. The right  ventricular size is moderately enlarged. There is mildly elevated  pulmonary artery systolic pressure. The estimated right ventricular  systolic pressure is 73.4 mmHg.  3. Left atrial size was moderately dilated.  4. Right atrial size was severely dilated.  5. The mitral valve is degenerative. Severe mitral valve regurgitation.  Moderate mitral annular calcification.  6. Tricuspid valve regurgitation is moderate.  7. The aortic valve is tricuspid. Aortic valve regurgitation is mild.  Mild aortic valve sclerosis is present, with no evidence of aortic valve  stenosis.    Comparison(s): EF is reduced. Mitral valve regurgitation is now severe.   Patient Profile     74 y.o. male with a hx of ischemic cardiomyopathy recent CABG in May, HFrEF, PAF on Eliquis, recent PE, lupus w/ ILD and pleural effusion who was admitted 11/23 with unintentional wt loss 20 lbs, lg R pleural effusion, edema.  Assessment & Plan    Acute on chronic systolic and diastolic CHF EF 19-37% With Lupus, Query of interstitial lung disease, a R sided pleural effusion s/p BAL 04/29/20 Severe MR Severe TR  AKI on CKD Stage III - s/p diuresis with 5 kg weight loss, repeat CXR shows he is recruiting fluid again in the right lung. Will give additional 40 mg IV lasix today.  -BP improved on milrinone - would recommend more aggressive diuresis today as tolerated, start lasix 40 mg IV BID. - Continue to follow co-ox daily  -Bp will not yet allow titration of HF meds  lg R pleural effusion - s/p tap w/ 1.1 L out - f/u CXR w/ no PTX  Pulm nodules - enlarging, see CT report above - s/p bronch  CAD - no ischemic sx - on ASA, statin - HR 50s at times and SBP 100s, no BB - PVC's and bigeminy noted, may be d/t milrinone - asymptomatic  PAF - on eliquis at home - would return when OK per pulm/primary - no BB as above - presently in sinus  PE -AC as above  CRITICAL CARE TIME: I have spent a total of 35 minutes with patient reviewing hospital notes, telemetry, EKGs, labs and examining the patient as well as establishing an assessment and plan that was discussed with the patient. > 50% of time was spent in direct patient care. The patient is critically ill with multi-organ system failure and requires  high complexity decision making for assessment and support, frequent evaluation and titration of therapies, application of advanced monitoring technologies and extensive interpretation of multiple databases.  For questions or updates, please contact [place] Please consult  [email or web address] for contact info under   Pixie Casino, MD, FACC, [place] Director of the [facility] Diplomate of the American Board of Clinical Lipidology Attending Cardiologist  Direct Dial: [phone]  Fax: [phone]  Website:  [email or web address]  Pixie Casino, MD  05/01/2020, 10:39 AM

## 2020-05-01 NOTE — Progress Notes (Signed)
[place] Progress Note Patient Name: Cody Arellano DOB: 1946-03-15 MRN: [id number]   Date of Service  05/01/2020  HPI/Events of Note  Reviewed patient's chart and discussed with 2H Charge RN regarding possible transfer to PCU. Patient required ICU for initiation of milrinone drip for decompensated heart failure. The patient has been on a stable dose of milrinone throughout course of today, and has had good UOP indicating improved renal perfusion. Vitals are within normal limits on this stable dose of milrinone.   eICU Interventions  Appropriate for transfer to PCU with ongoing fixed dose of milrinone. Transfer order entered.     Intervention Category Minor Interventions: Communication with other healthcare providers and/or family  Charlott Rakes 05/01/2020, 8:36 PM

## 2020-05-02 ENCOUNTER — Encounter (HOSPITAL_COMMUNITY)
Admission: EM | Disposition: A | Discharge status: Rehabilitation Facility | Payer: Self-pay | Source: Home / Self Care | Attending: Cardiology

## 2020-05-02 ENCOUNTER — Encounter (HOSPITAL_COMMUNITY): Payer: Self-pay | Admitting: Anesthesiology

## 2020-05-02 DIAGNOSIS — N179 Acute kidney failure, unspecified: Secondary | ICD-10-CM | POA: Diagnosis not present

## 2020-05-02 DIAGNOSIS — I480 Paroxysmal atrial fibrillation: Secondary | ICD-10-CM | POA: Diagnosis not present

## 2020-05-02 DIAGNOSIS — I5021 Acute systolic (congestive) heart failure: Secondary | ICD-10-CM | POA: Diagnosis not present

## 2020-05-02 DIAGNOSIS — J90 Pleural effusion, not elsewhere classified: Secondary | ICD-10-CM | POA: Diagnosis not present

## 2020-05-02 LAB — BASIC METABOLIC PANEL
Anion gap: 10 (ref 5–15)
Anion gap: 11 (ref 5–15)
Anion gap: 9 (ref 5–15)
BUN: 13 mg/dL (ref 8–23)
BUN: 12 mg/dL (ref 8–23)
BUN: 15 mg/dL (ref 8–23)
CO2: 31 mmol/L (ref 22–32)
CO2: 29 mmol/L (ref 22–32)
CO2: 30 mmol/L (ref 22–32)
Calcium: 8.8 mg/dL — ABNORMAL LOW (ref 8.9–10.3)
Calcium: 8.4 mg/dL — ABNORMAL LOW (ref 8.9–10.3)
Calcium: 8.3 mg/dL — ABNORMAL LOW (ref 8.9–10.3)
Chloride: 100 mmol/L (ref 98–111)
Chloride: 97 mmol/L — ABNORMAL LOW (ref 98–111)
Chloride: 99 mmol/L (ref 98–111)
Creatinine, Ser: 1.54 mg/dL — ABNORMAL HIGH (ref 0.61–1.24)
Creatinine, Ser: 1.55 mg/dL — ABNORMAL HIGH (ref 0.61–1.24)
Creatinine, Ser: 1.59 mg/dL — ABNORMAL HIGH (ref 0.61–1.24)
GFR, Estimated: 47 mL/min — ABNORMAL LOW (ref >60)
GFR, Estimated: 47 mL/min — ABNORMAL LOW (ref >60)
GFR, Estimated: 45 mL/min — ABNORMAL LOW (ref >60)
Glucose, Bld: 96 mg/dL (ref 70–99)
Glucose, Bld: 191 mg/dL — ABNORMAL HIGH (ref 70–99)
Glucose, Bld: 183 mg/dL — ABNORMAL HIGH (ref 70–99)
Potassium: 3.1 mmol/L — ABNORMAL LOW (ref 3.5–5.1)
Potassium: 3 mmol/L — ABNORMAL LOW (ref 3.5–5.1)
Potassium: 3.3 mmol/L — ABNORMAL LOW (ref 3.5–5.1)
Sodium: 141 mmol/L (ref 135–145)
Sodium: 137 mmol/L (ref 135–145)
Sodium: 138 mmol/L (ref 135–145)

## 2020-05-02 LAB — CBC
HCT: 37.6 % — ABNORMAL LOW (ref 39.0–52.0)
Hemoglobin: 11.2 g/dL — ABNORMAL LOW (ref 13.0–17.0)
MCH: 26.7 pg (ref 26.0–34.0)
MCHC: 29.8 g/dL — ABNORMAL LOW (ref 30.0–36.0)
MCV: 89.5 fL (ref 80.0–100.0)
Platelets: 160 10*3/uL (ref 150–400)
RBC: 4.2 MIL/uL — ABNORMAL LOW (ref 4.22–5.81)
RDW: 17 % — ABNORMAL HIGH (ref 11.5–15.5)
WBC: 3.4 10*3/uL — ABNORMAL LOW (ref 4.0–10.5)
nRBC: 0 % (ref 0.0–0.2)

## 2020-05-02 LAB — PNEUMOCYSTIS JIROVECI SMEAR BY DFA: Pneumocystis jiroveci Ag: NEGATIVE

## 2020-05-02 LAB — COOXEMETRY PANEL
Carboxyhemoglobin: 1.7 % — ABNORMAL HIGH (ref 0.5–1.5)
Carboxyhemoglobin: 1.4 % (ref 0.5–1.5)
Methemoglobin: 0.8 % (ref 0.0–1.5)
Methemoglobin: 0.7 % (ref 0.0–1.5)
O2 Saturation: 77.2 %
O2 Saturation: 59.7 %
Total hemoglobin: 11.6 g/dL — ABNORMAL LOW (ref 12.0–16.0)
Total hemoglobin: 11.1 g/dL — ABNORMAL LOW (ref 12.0–16.0)

## 2020-05-02 LAB — CULTURE, BODY FLUID W GRAM STAIN -BOTTLE: Culture: NO GROWTH

## 2020-05-02 LAB — MAGNESIUM: Magnesium: 2 mg/dL (ref 1.7–2.4)

## 2020-05-02 SURGERY — CANCELLED PROCEDURE

## 2020-05-02 MED ORDER — AMIODARONE HCL 200 MG PO TABS: 200.0000 mg | ORAL_TABLET | Freq: Two times a day (BID) | ORAL | Status: DC

## 2020-05-02 MED ORDER — NOREPINEPHRINE 4 MG/250ML-% IV SOLN
0.0000 ug/min | INTRAVENOUS | Status: DC
  Administered 2020-05-02 – 2020-05-03 (×2): 3 ug/min via INTRAVENOUS
  Administered 2020-05-04: 6 ug/min via INTRAVENOUS
  Administered 2020-05-04: 8 ug/min via INTRAVENOUS
  Administered 2020-05-05: 6 ug/min via INTRAVENOUS
  Administered 2020-05-09: 5 ug/min via INTRAVENOUS
  Administered 2020-05-12: 3 ug/min via INTRAVENOUS
  Filled 2020-05-02 (×9): qty 250

## 2020-05-02 MED ORDER — HEPARIN (PORCINE) 25000 UT/250ML-% IV SOLN
800.0000 [IU]/h | INTRAVENOUS | Status: DC
  Administered 2020-05-03: 800 [IU]/h via INTRAVENOUS
  Filled 2020-05-02: qty 250

## 2020-05-02 MED ORDER — AMIODARONE HCL IN DEXTROSE 360-4.14 MG/200ML-% IV SOLN
60.0000 mg/h | INTRAVENOUS | Status: AC
  Administered 2020-05-02: 60 mg/h via INTRAVENOUS
  Filled 2020-05-02: qty 200

## 2020-05-02 MED ORDER — POTASSIUM CHLORIDE 10 MEQ/50ML IV SOLN
10.0000 meq | Freq: Once | INTRAVENOUS | Status: AC
  Administered 2020-05-02: 10 meq via INTRAVENOUS
  Filled 2020-05-02: qty 50

## 2020-05-02 MED ORDER — POTASSIUM CHLORIDE CRYS ER 20 MEQ PO TBCR
40.0000 meq | EXTENDED_RELEASE_TABLET | Freq: Once | ORAL | Status: AC
  Administered 2020-05-02: 40 meq via ORAL
  Filled 2020-05-02: qty 2

## 2020-05-02 MED ORDER — ADULT MULTIVITAMIN W/MINERALS CH: 1.0000 | ORAL_TABLET | Freq: Every day | ORAL | Status: DC

## 2020-05-02 MED ORDER — AMIODARONE LOAD VIA INFUSION
150.0000 mg | Freq: Once | INTRAVENOUS | Status: AC
  Administered 2020-05-02: 150 mg via INTRAVENOUS
  Filled 2020-05-02: qty 83.34

## 2020-05-02 MED ORDER — POTASSIUM CHLORIDE CRYS ER 20 MEQ PO TBCR
40.0000 meq | EXTENDED_RELEASE_TABLET | Freq: Once | ORAL | Status: AC
  Filled 2020-05-02: qty 2

## 2020-05-02 MED ORDER — AMIODARONE HCL 200 MG PO TABS: 200.0000 mg | ORAL_TABLET | Freq: Every day | ORAL | Status: DC

## 2020-05-02 MED ORDER — MILRINONE LACTATE IN DEXTROSE 20-5 MG/100ML-% IV SOLN
0.2500 ug/kg/min | INTRAVENOUS | Status: DC
  Administered 2020-05-02: 0.25 ug/kg/min via INTRAVENOUS

## 2020-05-02 MED ORDER — POTASSIUM CHLORIDE CRYS ER 20 MEQ PO TBCR
40.0000 meq | EXTENDED_RELEASE_TABLET | Freq: Once | ORAL | Status: AC
  Administered 2020-05-02: 40 meq via ORAL

## 2020-05-02 MED ORDER — AMIODARONE HCL IN DEXTROSE 360-4.14 MG/200ML-% IV SOLN
30.0000 mg/h | INTRAVENOUS | Status: DC
  Administered 2020-05-02 – 2020-05-03 (×2): 30 mg/h via INTRAVENOUS
  Administered 2020-05-03: 60 mg/h via INTRAVENOUS
  Administered 2020-05-04 – 2020-05-09 (×11): 30 mg/h via INTRAVENOUS
  Administered 2020-05-10 (×2): 60 mg/h via INTRAVENOUS
  Filled 2020-05-02 (×19): qty 200

## 2020-05-02 NOTE — Progress Notes (Signed)
NAME:  Cody Arellano, MRN:  [id number], DOB:  May 07, 1946, LOS: 6 ADMISSION DATE:  04/26/2020, CONSULTATION DATE:  05/02/20 REFERRING MD:  Kipp Brood, MD, CHIEF COMPLAINT:  DOE   Brief History   Patient is a 74 year old male I was asked to evaluate at bedside due to hypotension felt secondary to cardiogenic shock.  He currently is being evaluated by pulmonary critical care recurrent effusion and pulmonary nodules.  History of present illness   Patient was admitted with pleural effusion cardiomyopathy with ejection fraction of 30%, chronic A. fib and SLE on Imuran and Plaquenil.  Patient developed hypotension worse than baseline this evening.  On my evaluation the patient is awake alert interactive says he feels weak and tired.  He does not complain of dyspnea.  O2 saturation in the high 90s on room air.  He is not breathing rapidly nor using accessory muscles.  Chest x-ray shows increased interstitial markings bilaterally blood pressure is 92/70.  Hemoglobin is 11 and creatinine is stable at 1.49, platelet count 180.  Plan is for transfer to the ICU and initiation of milrinone.  BNP is 1600.  Past Medical History  CHF w/ reduced EF, mitral regurg, CAD, Afib, SLE  Significant Hospital Events   Admitted 11/23 thora 11/23  Consults:  PCCM VIR  Procedures:  Inocencio Homes 11/24  Significant Diagnostic Tests:  CT chest 11/23 - R>L bilateral pleural effusions, stable nodules largest 2 in LUL, pulmonary edema, bullous emphysematous changes scattered throughout  Pleural fluid transudate 11/24  Micro Data:  Pleural fluid CX 11/24 - pending BAL CX 11/26 - Neg BAL AF smear - neg BAL AFB - sent BAL Fungal - no fungus BAL PJP - sent BAL Asp - no result Antimicrobials:  Ceftriaxone 11/27> Azithro 11/27>  Interim history/subjective:   Reports overall feeling better. Improved dyspnea. No lower extremity swelling  Overnight, cardiology called for afib/flutter and started on amiodarone.  Currently rate-controlled. Remains on milrinone and lasix.  Objective   Blood pressure (!) 87/70, pulse (!) 124, temperature 98.2 F (36.8 C), temperature source Oral, resp. rate 18, height 6' (1.829 m), weight 59.4 kg, SpO2 94 %.        Intake/Output Summary (Last 24 hours) at 05/02/2020 1037 Last data filed at 05/02/2020 0044 Gross per 24 hour  Intake 531.19 ml  Output 1850 ml  Net -1318.81 ml   Filed Weights   04/29/20 1126 04/30/20 1000 05/01/20 2221  Weight: 61.9 kg 62.9 kg 59.4 kg   Physical Exam: General: Thin cachectic male with temporal wasting, no acute distress HENT: [place], AT, OP clear, MMM Eyes: EOMI, no scleral icterus Respiratory: Diminished breath sounds bilaterally.  No crackles, wheezing or rales Cardiovascular: Irregular rate and rhythm, SEM+, no JVD Extremities:-Edema,-tenderness Neuro: AAO x4, CNII-XII grossly intact Skin: Intact, no rashes or bruising  Resolved Hospital Problem list   n/a  Assessment & Plan:   Adequate blood pressures on milrinone. Co-ox yesterday 77.2. 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 Tentatively planning for TEE/DCCV today. Diuresis and inotropic management per consult team --Maintain K >4 and Mg >2. Repeat BMP and Mg at noon  Cardiorenal syndrome --Diuresis per Cardiology  Transudative effusion secondary heart failure BAL negative to date --Follow-up final pleural cultures --Follow-up final BAL  ILD secondary autoimmune SLE --No active issues. On room air --On home immunosuppressants: azathioprine, plaquenil  VTE --On apixaban  Daily Goals Checklist  Pain/Anxiety/Delirium protocol (if indicated): None required VAP protocol (if indicated): Not intubated Blood pressure target: Titrate DVT prophylaxis: On apixaban Nutrition Status: High nutritional risk, dietary consult.   Trial of Megace GI prophylaxis: Not indicated Urinary catheter: Condom catheter Central lines: PICC line Glucose control: Currently euglycemic on no therapy Mobility/therapy needs: PT consult Code Status: Full code Family Communication: Updated patient Disposition: Progressive. Transfer to [facility] 11/30. PCCM will intermittently follow for BAL studies after transfer  Labs   CBC: Recent Labs  Lab 04/28/20 0300 04/29/20 0336 04/30/20 0248 05/01/20 0528 05/02/20 0444  WBC 3.8* 3.1* 3.8* 3.8* 3.4*  HGB 12.1* 11.4* 12.6* 11.6* 11.2*  HCT 39.7 36.9* 43.2 39.7 37.6*  MCV 87.6 88.5 92.1 91.1 89.5  PLT 170 165 180 167 102    Basic Metabolic Panel: Recent Labs  Lab 04/27/20 0337 04/27/20 0337 04/28/20 1200 04/28/20 1200 04/29/20 0851 04/30/20 0248 05/01/20 0528 05/01/20 2000 05/02/20 0444  NA 140   < > 139   < > 139 139 143 142 141  K 3.0*   < > 4.0   < > 3.7 3.7 2.9* 3.0* 3.1*  CL 102   < > 102   < > 102 101 100 98 100  CO2 27   < > 25   < > _0 32 31  GLUCOSE 85   < > 130*   < > 68* 77 100* 129* 96  BUN 17   < > 21   < > _1 CREATININE 1.37*   < > 1.60*   < > 1.47* 1.49* 1.47* 1.45* 1.54*  CALCIUM 8.5*   < > 8.6*   < > 8.4* 8.5* 8.8* 8.7* 8.8*  MG 1.9  --  2.2  --   --   --  2.1  --   --   PHOS 4.1  --   --   --   --   --   --   --   --    < > = values in this interval not displayed.   GFR: Estimated Creatinine Clearance: 35.4 mL/min (A) (by C-G formula based on SCr of 1.54 mg/dL (H)). Recent Labs  Lab 04/29/20 0336 04/30/20 0248 04/30/20 2038 05/01/20 0528 05/02/20 0444  PROCALCITON  --  0.18  --  0.15  --   WBC 3.1* 3.8*  --  3.8* 3.4*  LATICACIDVEN  --   --  1.8  --   --     Liver Function Tests: Recent Labs  Lab 04/27/20 0337 04/27/20 1125  AST 21  --   ALT 19  --   ALKPHOS 48  --   BILITOT 1.1  --   PROT 6.1*  --   ALBUMIN 2.7* 3.0*   No results for input(s): LIPASE, AMYLASE in the last 168 hours. No results for input(s): AMMONIA  in the last 168 hours.  ABG    Component Value Date/Time   PHART 7.371 10/18/2019 0224   PCO2ART 35.4 10/18/2019 0224   PO2ART 113 (H) 10/18/2019 0224   HCO3 20.5 10/18/2019 0224   TCO2 23 11/12/2019 1730   ACIDBASEDEF 4.0 (H) 10/18/2019 0224   O2SAT 77.2 05/02/2020 0432     Coagulation Profile: Recent Labs  Lab 04/27/20 0337  INR 2.1*    Cardiac Enzymes: No results for input(s): CKTOTAL, CKMB, CKMBINDEX, TROPONINI in the last 168 hours.  HbA1C: Hgb A1c MFr Bld  Date/Time Value Ref Range Status  10/17/2019 12:05 PM  5.7 (H) 4.8 - 5.6 % Final    Comment:    (NOTE) Pre diabetes:          5.7%-6.4% Diabetes:              >6.4% Glycemic control for   <7.0% adults with diabetes    The patient is critically ill with multiple organ systems failure and requires high complexity decision making for assessment and support, frequent evaluation and titration of therapies, application of advanced monitoring technologies and extensive interpretation of multiple databases.  Independent Critical Care Time: 32 Minutes.   Rodman Pickle, M.D. [facility] Pulmonary/Critical Care Medicine 05/02/2020 10:37 AM   Please see Amion for pager number to reach on-call Pulmonary and Critical Care Team.

## 2020-05-02 NOTE — Progress Notes (Signed)
   05/02/20 0110  Assess: MEWS Score  BP 92/63  Pulse Rate 96  ECG Heart Rate (!) 120  SpO2 90 %  Assess: MEWS Score  MEWS Temp 0  MEWS Systolic 1  MEWS Pulse 2  MEWS RR 0  MEWS LOC 0  MEWS Score 3  MEWS Score Color Yellow  Assess: if the MEWS score is Yellow or Red  Were vital signs taken at a resting state? Yes  Focused Assessment Change from prior assessment (see assessment flowsheet)  Early Detection of Sepsis Score *See Row Information* Low  MEWS guidelines implemented *See Row Information* Yes  Take Vital Signs  Increase Vital Sign Frequency  Yellow: Q 2hr X 2 then Q 4hr X 2, if remains yellow, continue Q 4hrs  Escalate  MEWS: Escalate Yellow: discuss with charge nurse/RN and consider discussing with provider and RRT  Notify: Charge Nurse/RN  Name of Charge Nurse/RN Notified Christina, RN  Date Charge Nurse/RN Notified 05/02/20  Time Charge Nurse/RN Notified 0130  Document  Patient Outcome Stabilized after interventions  Progress note created (see row info) Yes

## 2020-05-02 NOTE — Progress Notes (Signed)
  Amiodarone Drug - Drug Interaction Consult Note  Recommendations: MONITOR POTASSIUM AND QTc  Amiodarone is metabolized by the cytochrome P450 system and therefore has the potential to cause many drug interactions. Amiodarone has an average plasma half-life of 50 days (range 20 to 100 days).   There is potential for drug interactions to occur several weeks or months after stopping treatment and the onset of drug interactions may be slow after initiating amiodarone.   []  Statins: Increased risk of myopathy. Simvastatin- restrict dose to 20mg  daily. Other statins: counsel patients to report any muscle pain or weakness immediately.  [x]  Anticoagulants: Amiodarone can increase anticoagulant effect. Consider warfarin dose reduction. Patients should be monitored closely and the dose of anticoagulant altered accordingly, remembering that amiodarone levels take several weeks to stabilize.  []  Antiepileptics: Amiodarone can increase plasma concentration of phenytoin, the dose should be reduced. Note that small changes in phenytoin dose can result in large changes in levels. Monitor patient and counsel on signs of toxicity.  []  Beta blockers: increased risk of bradycardia, AV block and myocardial depression. Sotalol - avoid concomitant use.  []   Calcium channel blockers (diltiazem and verapamil): increased risk of bradycardia, AV block and myocardial depression.  []   Cyclosporine: Amiodarone increases levels of cyclosporine. Reduced dose of cyclosporine is recommended.  []  Digoxin dose should be halved when amiodarone is started.  [x]  Diuretics: increased risk of cardiotoxicity if hypokalemia occurs.  []  Oral hypoglycemic agents (glyburide, glipizide, glimepiride): increased risk of hypoglycemia. Patient's glucose levels should be monitored closely when initiating amiodarone therapy.   [x]  Drugs that prolong the QT interval:  Torsades de pointes risk may be increased with concurrent use - avoid if  possible.  Monitor QTc, also keep magnesium/potassium WNL if concurrent therapy can't be avoided. [name] Antibiotics: e.g. fluoroquinolones, erythromycin. . Antiarrhythmics: e.g. quinidine, procainamide, disopyramide, sotalol. . Antipsychotics: e.g. phenothiazines, haloperidol.  . Lithium, tricyclic antidepressants, and methadone.  Wynona Neat, PharmD, BCPS 05/02/2020 4:29 AM

## 2020-05-02 NOTE — Progress Notes (Signed)
ANTICOAGULATION CONSULT NOTE - Initial Consult  Pharmacy Consult for heparin drip Indication: atrial fibrillation and PE  No Known Allergies  Patient Measurements: Height: 6' (182.9 cm) Weight: 59.4 kg (131 lb) IBW/kg (Calculated) : 77.6 Heparin Dosing Weight: 59.4 kg  Vital Signs: Temp: 98.7 F (37.1 C) (11/29 1601) Temp Source: Oral (11/29 1600) BP: 85/46 (11/29 1601) Pulse Rate: 114 (11/29 1601)  Labs: Recent Labs    04/30/20 0248 04/30/20 0248 05/01/20 0528 05/01/20 0528 05/01/20 2000 05/02/20 0444 05/02/20 1410  HGB 12.6*   < > 11.6*  --   --  11.2*  --   HCT 43.2  --  39.7  --   --  37.6*  --   PLT 180  --  167  --   --  160  --   APTT 63*  --   --   --   --   --   --   HEPARINUNFRC >2.20*  --   --   --   --   --   --   CREATININE 1.49*   < > 1.47*   < > 1.45* 1.54* 1.55*   < > = values in this interval not displayed.    Estimated Creatinine Clearance: 35.1 mL/min (A) (by C-G formula based on SCr of 1.55 mg/dL (H)).   Medical History: Past Medical History:  Diagnosis Date  . Anxiety   . CHF (congestive heart failure) ([place])   . Chronic tension headache   . Colon polyps   . Coronary artery disease 2008/2009   MI with PCI x 2, then PCI x 1 in 2009  . Depression   . Dyslipidemia   . Dysrhythmia    atrial fibrillation  . Hyperlipidemia   . Hypertension   . Lupus [facility])    sees Dr Trudie Reed  . Lupus disease of the lung   . 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 Myocardial infarction ([place]) 2008  . 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 Shortness of breath     Assessment: 74 yo male with new onset PE after missing apixaban doses. Patient with atrial fibrillation and MD wishes to change patient from apixaban to heparin drip. Last dose of apixaban at 1025 this AM. Will need to wait until 2230 to start heparin drip. Will use APTT to monitor heparin due to effect on Heparin levels by apixaban.   Goal of  Therapy:  Heparin level 0.3-0.7 units/ml aPTT 66-102 seconds Monitor platelets by anticoagulation protocol: Yes   Plan:  D/C Apixaban Start heparin drip at 2230 at a rate of 800 units/hr with no bolus Heparin level and APTT at 0630 on 11/30 Daily heparin level and APTT  Analeah Brame A. Levada Dy, PharmD, BCPS, FNKF Clinical Pharmacist [place] Please utilize Amion for appropriate phone number to reach the unit pharmacist ([place])   05/02/2020,6:18 PM

## 2020-05-02 NOTE — Progress Notes (Addendum)
Progress Note  Patient Name: Cody Arellano Date of Encounter: 05/02/2020  [facility] Cardiologist: Mertie Moores, MD   Subjective   Cardiology called overnight for tachyarrythmia. Patient found to be in afib/flutter RVR and started on IV amiodarone. Pressures soft this morning. Still on milrinone and IV lasix. Patient appears comfortable during interview. HE denies chest pain, sob, or palpitations. Tentative TEE/DCCV today.   Inpatient Medications    Scheduled Meds: . amiodarone  200 mg Oral Q12H   Followed by  . [START ON 05/10/2020] amiodarone  200 mg Oral Daily  . apixaban  10 mg Oral BID   Followed by  . [START ON 05/04/2020] apixaban  5 mg Oral BID  . aspirin EC  81 mg Oral Daily  . azaTHIOprine  100 mg Oral BID  . Chlorhexidine Gluconate Cloth  6 each Topical Daily  . docusate sodium  100 mg Oral BID  . feeding supplement  237 mL Oral TID BM  . furosemide  40 mg Intravenous BID  . hydroxychloroquine  400 mg Oral QHS  . megestrol  40 mg Oral Daily  . potassium chloride  40 mEq Oral BID  . rosuvastatin  20 mg Oral Daily  . sodium chloride flush  10-40 mL Intracatheter Q12H  . tamsulosin  0.4 mg Oral QPM   Continuous Infusions: . sodium chloride Stopped (05/01/20 1559)  . amiodarone 60 mg/hr (05/02/20 0459)   Followed by  . amiodarone    . lactated ringers Stopped (05/01/20 1600)  . milrinone 0.375 mcg/kg/min (05/02/20 0111)  . potassium chloride     PRN Meds: bisacodyl, lidocaine, ondansetron (ZOFRAN) IV, oxyCODONE, polyethylene glycol, sodium chloride flush   Vital Signs    Vitals:   05/02/20 0146 05/02/20 0304 05/02/20 0534 05/02/20 0708  BP: 99/60 (!) 91/52 91/69 (!) 87/70  Pulse: (!) 57 96 (!) 108 (!) 124  Resp: _0 Temp: 98 F (36.7 C) 98.2 F (36.8 C) 98.1 F (36.7 C) 98.2 F (36.8 C)  TempSrc: Oral Oral Oral Oral  SpO2: 96% 92% 97% 94%  Weight:      Height:        Intake/Output Summary (Last 24 hours) at 05/02/2020 0742 Last  data filed at 05/02/2020 0044 Gross per 24 hour  Intake 620.25 ml  Output 2150 ml  Net -1529.75 ml   Last 3 Weights 05/01/2020 04/30/2020 04/29/2020  Weight (lbs) 131 lb 138 lb 10.7 oz 136 lb 7.4 oz  Weight (kg) 59.421 kg 62.9 kg 61.9 kg      Telemetry    Afib/futter, HR around 100, up to 120s - Personally Reviewed  ECG    Aflutter, 2:1 block, 114bpm, possible LVH, q waves anterinf leads - Personally Reviewed  Physical Exam   GEN: No acute distress.   Neck: + JVD Cardiac: Irreg Ireg, + murmurs, rubs, or gallops.  Respiratory: diminished on the right side GI: Soft, nontender, non-distended  MS: No edema; No deformity. Neuro:  Nonfocal  Psych: Normal affect   Labs    High Sensitivity Troponin:   Recent Labs  Lab 04/06/20 2134 04/06/20 2341  TROPONINIHS 28* 25*      Chemistry Recent Labs  Lab 04/27/20 0337 04/27/20 1125 04/28/20 1200 05/01/20 0528 05/01/20 2000 05/02/20 0444  NA 140  --    < > 143 142 141  K 3.0*  --    < > 2.9* 3.0* 3.1*  CL 102  --    < > 100 98  100  CO2 27  --    < > 29 32 31  GLUCOSE 85  --    < > 100* 129* 96  BUN 17  --    < > _0 CREATININE 1.37*  --    < > 1.47* 1.45* 1.54*  CALCIUM 8.5*  --    < > 8.8* 8.7* 8.8*  PROT 6.1*  --   --   --   --   --   ALBUMIN 2.7* 3.0*  --   --   --   --   AST 21  --   --   --   --   --   ALT 19  --   --   --   --   --   ALKPHOS 48  --   --   --   --   --   BILITOT 1.1  --   --   --   --   --   GFRNONAA 54*  --    < > 50* 51* 47*  ANIONGAP 11  --    < > _1 < > = values in this interval not displayed.     Hematology Recent Labs  Lab 04/30/20 0248 05/01/20 0528 05/02/20 0444  WBC 3.8* 3.8* 3.4*  RBC 4.69 4.36 4.20*  HGB 12.6* 11.6* 11.2*  HCT 43.2 39.7 37.6*  MCV 92.1 91.1 89.5  MCH 26.9 26.6 26.7  MCHC 29.2* 29.2* 29.8*  RDW 18.0* 17.2* 17.0*  PLT 180 167 160    BNP Recent Labs  Lab 04/26/20 1319 04/30/20 0248  BNP 1,824.4* 5,102.5*     DDimer No results for  input(s): DDIMER in the last 168 hours.   Radiology    DG CHEST PORT 1 VIEW  Result Date: 04/30/2020 CLINICAL DATA:  Cough and shortness of breath EXAM: PORTABLE CHEST 1 VIEW COMPARISON:  April 27, 2020 chest radiograph and chest CT April 26, 2020 FINDINGS: There is an apparent skin fold on the left. No pneumothorax is appreciable. There is patchy airspace opacity in the lower lung regions, slightly more on the right than the left. There is cardiomegaly with pulmonary venous hypertension. Patient is status post coronary artery bypass grafting. A left atrial appendage clamp is noted. There is aortic atherosclerosis. No adenopathy. There is thoracic dextroscoliosis. Evidence of old trauma involving the left clavicle. IMPRESSION: Patchy airspace opacity in the lung bases, most likely due to multifocal pneumonia. There is a degree of underlying cardiomegaly with pulmonary vascular congestion. There may be superimposed degree of pulmonary edema. Note that both edema and pneumonia may present concurrently. Postoperative changes noted.  Scoliosis noted. Aortic Atherosclerosis (ICD10-I70.0). Electronically Signed   By: Lowella Grip III M.D.   On: 04/30/2020 11:05   [place] EKG SITE RITE  Result Date: 04/30/2020 If Site Rite image not attached, placement could not be confirmed due to current cardiac rhythm.   Cardiac Studies    ECHO: 04/28/2020 1. Left ventricular ejection fraction, by estimation, is 30 to 35%. The left ventricle has moderately decreased function. The left ventricle demonstrates global hypokinesis. The left ventricular internal cavity size was mildly dilated. Left ventricular diastolic parameters are consistent with Grade III diastolic dysfunction (restrictive).  2. Right ventricular systolic function is mildly reduced. The right  ventricular size is moderately enlarged. There is mildly elevated  pulmonary artery systolic pressure. The estimated right ventricular  systolic  pressure is 85.2 mmHg.  3. Left  atrial size was moderately dilated.  4. Right atrial size was severely dilated.  5. The mitral valve is degenerative. Severe mitral valve regurgitation.  Moderate mitral annular calcification.  6. Tricuspid valve regurgitation is moderate.  7. The aortic valve is tricuspid. Aortic valve regurgitation is mild.  Mild aortic valve sclerosis is present, with no evidence of aortic valve  stenosis.   Comparison(s): EF is reduced. Mitral valve regurgitation is now severe.   Patient Profile     74 y.o. male with a hx of ischemic cardiomyopathy, recent CABG in May, HFrEF, PAF on ELiquis, recent PE, lupus w/ ILD and pleural effusion admitted 11/23 with unintentional weight loss of 20lbs, lg right pleural effusion and edema.   Assessment & Plan    Acute on chronic systolic and diastolic CHF/ CM EF 90-30% Severe MR and TR  SLE/ILD - milinone 0.327mg/kg/hr. COOX today 77.2 - IV lasix 464mBID - Pt put out 1.5L overnight. Weight down 147lbs to 131lbs.  - creatinine relatively stable - will discuss diuresis with MD  PAF - Patient went into afib/flutter RVR overnight and started on IV amio - On Eliquis - No BB as above, pressures soft on milrinone - NPO for TEE/DCCV today. MD to see before procedure.  S/p large R pleural effusion - s/p tab with 1.1 L out - f/u CXR with no ptx  AKI - creatinine baseline around 1.4 - reasonably stable today  Pulmonary nodules - enlarging per CT - s/p bronch  CAD - no ischemic symptoms - continue aspirin or statin - no BB with baseline bradycardia and soft pressures  Hypokalemia - supplement as needed   For questions or updates, please contact [facility] Please consult [email or web address] for contact info under        Signed, Cadence H Ninfa MeekerPA-C  05/02/2020, 7:42 AM    Patient seen and examined and agree with Cadence FuKathlen ModyPA-C as detailed above.  In brief, the patient is [age].o. male with a hx of  ischemic cardiomyopathy, CAD s/p recent CABG in 10/2019, PAF on ELiquis, recent PE, lupus w/ ILD and pleural effusion admitted 11/23 with unintentional weight loss of 20lbs, right pleural effusion and worsening LE edema with course complicated by cardiogenic shock requiring milrinone initiation and Afib with RVR now improving.   Today, patient states that he feels overall fatigued but breathing is better. Remains on milrinone 0.375 with SBPs 90s. Developed Afib with RVR last night and was placed on amiodarone gtt with improvement of rates to 100s.  TTE personally reviewed and reveals LVEF 30%, global hypokinesis (septum looks worse), severe MR, dilated RV with mildly reduced systolic function. Grade III DD.  Exam: GEN: Cachectic appearing male, NAD   Neck: Mildly elevated JVD Cardiac: Irregularly irregular, 2/6 systolic murmur at apex Respiratory: Faint crackles. Diminished at bases GI: Soft, nontender, non-distended  MS: No edema; Warm. Neuro:  Nonfocal  Psych: Normal affect   Plan: -Consult heart failure team -Continue milrinone 0.375 and trend co-ox -Continue diuresis with lasix 4097mV BID--volume status improving -Unable to tolerate GDMT due to need for inotropic support -Continue amiodarone and apixaban for Afib -Once euvolemic, can plan on TEE/DCCV if remains in Afib -Concerned about patient's cachexia and if this represents underlying severity of his HF vs ILD/SLE vs underlying malignancy; will have HF evaluate further--awaiting  BAL and pleural cultures. MR of the abdomen without acute pathology/mass. CT chest with multiple pulmonary nodules that appear infectious/inflammatory rather than malignant  HeaGwyndolyn KaufmanD

## 2020-05-02 NOTE — Progress Notes (Signed)
Patient had a 5-beat-run of V-tach, resting comfortably asymptomatic with no chest pain or shortness of breath. Will continue to monitor.   Elaina Hoops, RN

## 2020-05-02 NOTE — Consult Note (Addendum)
Advanced Heart Failure Team Consult Note   Primary Physician: Venia Carbon, MD PCP-Cardiologist:  Mertie Moores, MD  Reason for Consultation: ADHF  HPI:    Cody Arellano 74 y.o. 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  Presented to [facility] on 11/23 for shortness of breath found to have pleural effusion and pulmonary nodules on CT. Reported being complaint with home medications. Given 40 mg IV lasix in ED then diuretics held due to soft Bps. He underwent right thoracentesis with 1.1L pleural fluid removed.  PCCM consulted for pulmonary nodules and bronch w/ BAL and cultures completed on 11/26.  Cardiology recommended placement of PICC found to have co-ox 41% and milrinone started 0.25 mcg.  Dose of milrinone increased to 0.375 mcg (on 11/28 for co-ox 49%) and started on lasix 40 mg BID IV.  This morning went into afib/flutter RVR started on IV amiodarone with goal to TEE-DCCV today.  Advanced Heart Failure consulted today for evaluation of cardiogenic shock at the request of Dr. Johney Frame.   Cody Arellano sitting up in bed, eating lunch.  Today, denies chest pain, shortness of breath, palpitations but does note some lightheadedness last night.  He denies weight gain but has been experiencing weight loss.  He noted some swelling to his feet which have improved. He reports filling up 2 urinals today.    Admission 5/15-5/25/2021 for acute STEMI found to have acute 100% occulsion to LAD at site of previous stent with 90% proximal stenosis of large ramus intermediate branch.  He required IABP and primary balloon anigoplasty was performed restoring flow to LAD and unable to complete PCI. RHC: RA 6; RV 43/6, PA 42/15, PCWP 15, Fick 4.4/2.3, thermo  3.9/2.0.  CTS surgery consulted and underwent CABG x 2 LIMA-LAD and SVG to Ramus intermediate as well as left atrial appendage clipping with 45 mm Atricure proclip 2.  He was weaned off pressors and IABP removed on POD #2.  He was on amiodarone gtt for afib which converted to SR but went back to afib/flutter.    CTS OV 6/14 for f/u CABG, experiencing dizziness and hypotension.  Lasix stopped but continued with spiro and change cozaar to 12.5 mg daily.   Admission 8/26-8/31/2021 for sepsis 2/2 rectal abscess requiring I&D and IV abx transitioned to bactrim DS x 5 days and acute respiratory failure 2/2 atelectasis vs volume overload.    Admission 10/19-10/22/2021 presented for shortness of breath found to have bilateral lower lobe PE after missing several doses of eliquis (4 days) was started on heparin gtt.  Echo revealed EF 45-50% with moderate reduction in RV systolic function.  Given IV lasix but transitioned back to PO lasix and PO eliquis 100 mg BID x 7 days then followed by 5 mg PO BID.    Admission 11/3-11/09/2019 for acute on chronic systolic CHF was started on PO lasix outpatient cardiologist at the [facility] a few days ago but no response with urine output or shortness of breath.  He was given IV lasix received a total of 2 doses with good response.  He was discharged home, educated on fluid restrictions, low sodium diet and taking additional lasix if weight gain or swelling.    Review of  Systems: [y] = yes, _0  = no   . General: Weight gain _1 ; Weight loss _2 ; Anorexia _3 ; Fatigue _4 ; Fever _5 ; Chills _6 ; Weakness _7   . Cardiac: Chest pain/pressure _8 ; Resting SOB _9 ; Exertional SOB _10 ; Orthopnea _11 ; Pedal Edema _12 ; Palpitations _13 ; Syncope _14 ; Presyncope _15 ; Paroxysmal nocturnal dyspnea_16   . Pulmonary: Cough _17 ; Wheezing_18 ; Hemoptysis_19 ; Sputum _20 ; Snoring _21   . GI: Vomiting_22 ; Dysphagia_23 ; Melena_24 ; Hematochezia _25 ; Heartburn_26 ; Abdominal pain _27 ; Constipation _28 ;  Diarrhea _29 ; BRBPR _30   . GU: Hematuria_31 ; Dysuria _32 ; Nocturia_33   . Vascular: Pain in legs with walking _34 ; Pain in feet with lying flat _35 ; Non-healing sores _36 ; Stroke _37 ; TIA _38 ; Slurred speech _39 ;  . Neuro: Headaches_40 ; Vertigo_41 ; Seizures_42 ; Paresthesias_43 ;Blurred vision _44 ; Diplopia _45 ; Vision changes _46   . Ortho/Skin: Arthritis _47 ; Joint pain _48 ; Muscle pain _49 ; Joint swelling _50 ; Back Pain _51 ; Rash _52   . Psych: Depression_53 ; Anxiety_54   . Heme: Bleeding problems _55 ; Clotting disorders _56 ; Anemia _57   . Endocrine: Diabetes _58 ; Thyroid dysfunction_59   Home Medications Prior to Admission medications   Medication Sig Start Date End Date Taking? Authorizing Provider  apixaban (ELIQUIS) 5 MG TABS tablet Take 1 tablet (5 mg total) by mouth 2 (two) times daily. 04/01/20 05/01/20 Yes Pahwani, Einar Grad, MD  aspirin EC 81 MG tablet Take 1 tablet (81 mg total) by mouth daily. 08/10/14  Yes Nahser, Wonda Cheng, MD  azaTHIOprine (IMURAN) 50 MG tablet Take 100 mg by mouth 2 (two) times daily.  10/11/14  Yes [provider]  furosemide (LASIX) 40 MG tablet Please take 40 mg oral daily, and take extra 40 mg of Lasix if you gain 3 pounds have worsening lower extremity edema for 1 day. 04/07/20  Yes Elgergawy, Silver Huguenin, MD  hydroxychloroquine (PLAQUENIL) 200 MG tablet Take 400 mg by mouth at bedtime.    Yes [provider]  oxyCODONE (OXY IR/ROXICODONE) 5 MG immediate release tablet Take 1 tablet (5 mg total) by mouth every 6 (six) hours as needed for severe pain. 02/02/20  Yes Rai, Ripudeep K, MD  polyethylene glycol (MIRALAX / GLYCOLAX) 17 g packet Take 17 g by mouth daily as needed for moderate constipation.    Yes [provider]  polyvinyl alcohol (ARTIFICIAL TEARS) 1.4 % ophthalmic solution Place 2 drops into both eyes daily.    Yes [provider]  rosuvastatin (CRESTOR) 20 MG tablet Take 20 mg by mouth daily.   Yes [provider]   tamsulosin (FLOMAX) 0.4 MG CAPS capsule Take 0.4 mg by mouth every evening.  04/02/17  Yes [provider]    Past Medical History: Past Medical History:  Diagnosis Date  . Anxiety   . CHF (congestive heart failure) ([place])   . Chronic tension headache   . Colon polyps   . Coronary artery disease 2008/2009   MI with PCI x 2, then PCI x 1 in 2009  . Depression   . Dyslipidemia   . Dysrhythmia    atrial fibrillation  . Hyperlipidemia   . Hypertension   . Lupus [facility])    sees Dr Trudie Reed  . Lupus disease of the lung   . Lupus  pericarditis ([place])   . Myocardial infarction ([place]) 2008  . 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 Shortness of breath     Past Surgical History: Past Surgical History:  Procedure Laterality Date  . BRONCHIAL WASHINGS  04/29/2020   Procedure: BRONCHIAL WASHINGS;  Surgeon: Garner Nash, DO;  Location: [facility];  Service: Pulmonary;;  . CARDIAC CATHETERIZATION    . CLIPPING OF ATRIAL APPENDAGE N/A 10/17/2019   Procedure: Clipping Of Atrial Appendage using AtriCure [id number] 45 MM AtriClip.;  Surgeon: Rexene Alberts, MD;  Location: [place];  Service: Open Heart Surgery;  Laterality: N/A;  . CORONARY ARTERY BYPASS GRAFT N/A 10/17/2019   Procedure: CORONARY ARTERY BYPASS GRAFTING (CABG) using LIMA to LAD; Endoscopic harvest right greater saphenous vein: SVG to RAMUS.;  Surgeon: Rexene Alberts, MD;  Location: [facility];  Service: Open Heart Surgery;  Laterality: N/A;  . CORONARY BALLOON ANGIOPLASTY N/A 10/17/2019   Procedure: CORONARY BALLOON ANGIOPLASTY;  Surgeon: Nelva Bush, MD;  Location: [place] [facility];  Service: Cardiovascular;  Laterality: N/A;  . coronary stents  2009  . CORONARY/GRAFT ACUTE MI REVASCULARIZATION N/A 10/17/2019   Procedure: Coronary/Graft Acute MI Revascularization;  Surgeon: Nelva Bush, MD;  Location: [place] [facility];  Service: Cardiovascular;  Laterality:  N/A;  . ENDOVEIN HARVEST OF GREATER SAPHENOUS VEIN Right 10/17/2019   Procedure: [place] Of Greater Saphenous Vein;  Surgeon: Rexene Alberts, MD;  Location: [place];  Service: Open Heart Surgery;  Laterality: Right;  . IABP INSERTION N/A 10/17/2019   Procedure: IABP Insertion;  Surgeon: Nelva Bush, MD;  Location: [place] [facility];  Service: Cardiovascular;  Laterality: N/A;  . INCISION AND DRAINAGE ABSCESS N/A 01/29/2020   Procedure: INCISION AND DRAINAGE BILATERAL PERIRECTAL ABSCESS;  Surgeon: Stark Klein, MD;  Location: [facility];  Service: General;  Laterality: N/A;  . IR THORACENTESIS ASP PLEURAL SPACE W/IMG GUIDE  10/26/2019  . IR THORACENTESIS ASP PLEURAL SPACE W/IMG GUIDE  04/27/2020  . RIGHT/LEFT HEART CATH AND CORONARY ANGIOGRAPHY N/A 10/17/2019   Procedure: RIGHT/LEFT HEART CATH AND CORONARY ANGIOGRAPHY;  Surgeon: Nelva Bush, MD;  Location: [place] [facility];  Service: Cardiovascular;  Laterality: N/A;  . TEE WITHOUT CARDIOVERSION  10/17/2019   Procedure: Transesophageal Echocardiogram (Tee);  Surgeon: Rexene Alberts, MD;  Location: [facility];  Service: Open Heart Surgery;;  . VIDEO BRONCHOSCOPY N/A 04/29/2020   Procedure: VIDEO BRONCHOSCOPY WITHOUT FLUORO;  Surgeon: Garner Nash, DO;  Location: [place];  Service: Pulmonary;  Laterality: N/A;    Family History: Family History  Problem Relation Age of Onset  . Heart disease Mother   . Alcohol abuse Father   . Hyperlipidemia Sister   . Hypertension Sister   . Hyperlipidemia Brother   . Hypertension Brother   . Cancer Brother        ?lung cancer  . Stomach cancer Brother   . Diabetes Paternal Uncle   . Colon cancer Neg Hx   . Esophageal cancer Neg Hx   . Rectal cancer Neg Hx     Social History: Social History   Socioeconomic History  . Marital status: Married    Spouse name: Not on file  . Number of children: 5  . Years of education: Not on file  . Highest education level: Not on file   Occupational History  . 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 Smoking status: Former Smoker    Packs/day: 1.00    Years: 20.00    Pack years: 20.00    Types: Cigarettes    Quit date: 02/26/1993    Years since quitting: 27.1  . Smokeless tobacco: Never Used  . Tobacco comment: QUIT SMOKING 20 YEARS AGO  Vaping Use  . Vaping Use: Never used  Substance and Sexual Activity  . Alcohol use: No  . Drug use: No  . Sexual activity: Not on file  Other Topics Concern  . Not on file  Social History Narrative   No living will   Wife should make health care decisions--alternate is sons   Would accept resuscitation   Not sure about tube feeds   Social Determinants of Health   Financial Resource Strain:   . Difficulty of Paying Living Expenses: Not on file  Food Insecurity: No Food Insecurity  . Worried About Charity fundraiser in the Last Year: Never true  . Ran Out of Food in the Last Year: Never true  Transportation Needs: No Transportation Needs  . Lack of Transportation (Medical): No  . Lack of Transportation (Non-Medical): No  Physical Activity: Insufficiently Active  . Days of Exercise per Week: 4 days  . Minutes of Exercise per Session: 10 min  Stress:   . Feeling of Stress : Not on file  Social Connections:   . Frequency of Communication with Friends and Family: Not on file  . Frequency of Social Gatherings with Friends and Family: Not on file  . Attends Religious Services: Not on file  . Active Member of Clubs or Organizations: Not on file  . Attends Archivist Meetings: Not on file  . Marital Status: Not on file    Allergies:  No Known Allergies  Objective:    Vital Signs:   Temp:  [97.8 F (36.6 C)-98.8 F (37.1 C)] 98.2 F (36.8 C) (11/29 0708) Pulse Rate:  [57-124] 124 (11/29 0708) Resp:  [0-21] 18 (11/29 0708) BP: (87-113)/(52-81) 87/70 (11/29 0708) SpO2:  [90 %-97 %] 94 % (11/29  0708) Weight:  [59.4 kg] 59.4 kg (11/28 2221) Last BM Date: 05/01/20  Weight change: Filed Weights   04/29/20 1126 04/30/20 1000 05/01/20 2221  Weight: 61.9 kg 62.9 kg 59.4 kg    Intake/Output:   Intake/Output Summary (Last 24 hours) at 05/02/2020 1359 Last data filed at 05/02/2020 0044 Gross per 24 hour  Intake 450.47 ml  Output 1850 ml  Net -1399.53 ml      Physical Exam    General:  Cachectic. No resp difficulty HEENT: normal Neck: supple. + JVD . Carotids 2+ bilat; no bruits. No lymphadenopathy or thyromegaly appreciated. Cor: PMI nondisplaced. Iregular rate & rhythm. No rubs, gallops or murmurs. Lungs: clear bilaterally.  Abdomen: soft, nontender, nondistended. No hepatosplenomegaly. No bruits or masses. Good bowel sounds. Extremities: no cyanosis, clubbing, rash, edema.  Neuro: alert & oriented x 3, cranial nerves grossly intact. moves all 4 extremities w/o difficulty. Affect pleasant   Telemetry   Afib with rates 104-140s, personally reviewed.    EKG    Aflutter with QTc 521.    Labs   Basic Metabolic Panel: Recent Labs  Lab 04/27/20 0337 04/27/20 0337 04/28/20 1200 04/28/20 1200 04/29/20 0851 04/29/20 0851 04/30/20 0248 04/30/20 0248 05/01/20 0528 05/01/20 2000 05/02/20 0444  NA 140   < > 139   < > 139  --  139  --  143 142 141  K 3.0*   < > 4.0   < > 3.7  --  3.7  --  2.9* 3.0* 3.1*  CL 102   < > 102   < > 102  --  101  --  100 98 100  CO2 27   < > 25   < > 25  --  28  --  29 32 31  GLUCOSE 85   < > 130*   < > 68*  --  77  --  100* 129* 96  BUN 17   < > 21   < > 20  --  21  --  _0 CREATININE 1.37*   < > 1.60*   < > 1.47*  --  1.49*  --  1.47* 1.45* 1.54*  CALCIUM 8.5*   < > 8.6*   < > 8.4*   < > 8.5*   < > 8.8* 8.7* 8.8*  MG 1.9  --  2.2  --   --   --   --   --  2.1  --   --   PHOS 4.1  --   --   --   --   --   --   --   --   --   --    < > = values in this interval not displayed.    Liver Function Tests: Recent Labs  Lab  04/27/20 0337 04/27/20 1125  AST 21  --   ALT 19  --   ALKPHOS 48  --   BILITOT 1.1  --   PROT 6.1*  --   ALBUMIN 2.7* 3.0*   No results for input(s): LIPASE, AMYLASE in the last 168 hours. No results for input(s): AMMONIA in the last 168 hours.  CBC: Recent Labs  Lab 04/28/20 0300 04/29/20 0336 04/30/20 0248 05/01/20 0528 05/02/20 0444  WBC 3.8* 3.1* 3.8* 3.8* 3.4*  HGB 12.1* 11.4* 12.6* 11.6* 11.2*  HCT 39.7 36.9* 43.2 39.7 37.6*  MCV 87.6 88.5 92.1 91.1 89.5  PLT 170 165 180 167 160    Cardiac Enzymes: No results for input(s): CKTOTAL, CKMB, CKMBINDEX, TROPONINI in the last 168 hours.  BNP: BNP (last 3 results) Recent Labs    04/06/20 2134 04/26/20 1319 04/30/20 0248  BNP 1,677.6* 1,824.4* 1,685.5*    ProBNP (last 3 results) No results for input(s): PROBNP in the last 8760 hours.   CBG: No results for input(s): GLUCAP in the last 168 hours.  Coagulation Studies: No results for input(s): LABPROT, INR in the last 72 hours.   Imaging    No results found.   Medications:     Current Medications: . amiodarone  200 mg Oral Q12H   Followed by  . [START ON 05/10/2020] amiodarone  200 mg Oral Daily  . apixaban  10 mg Oral BID   Followed by  . [START ON 05/04/2020] apixaban  5 mg Oral BID  . aspirin EC  81 mg Oral Daily  . azaTHIOprine  100 mg Oral BID  . Chlorhexidine Gluconate Cloth  6 each Topical Daily  . docusate sodium  100 mg Oral BID  . feeding supplement  237 mL Oral TID BM  . furosemide  40 mg Intravenous BID  . hydroxychloroquine  400 mg Oral QHS  . megestrol  40 mg Oral Daily  . rosuvastatin  20 mg Oral Daily  . sodium chloride flush  10-40 mL Intracatheter Q12H  . tamsulosin  0.4 mg Oral  QPM     Infusions: . sodium chloride Stopped (05/01/20 1559)  . amiodarone 30 mg/hr (05/02/20 1057)  . lactated ringers Stopped (05/01/20 1600)  . milrinone 0.375 mcg/kg/min (05/02/20 0111)       Patient Profile   Cody Arellano 74 y.o. 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  Assessment/Plan   1. Acute systolic CHF:  -Admitted May 2021 with anterior STEMI. LHC with occluded ostial LAD (in-stent), 90% ostial ramus, 60-70% proximal LCx, nondominant RCA.  PTCA to LAD and ramus to restore flow, then CABG with LIMA-LAD and SVG-ramus. Post-op CABG echo with EF 45-50%, septal hypokinesis, bicuspid AoV with mild AS and mild AI. -Echo 11/25: EF 30-35%, global hypokinesis, grade III DD, reduced RV systolic function with RV systolic pressure 41, severe MR, mod TR, mild  AR and AS.   -On milrinone 0.375 (started 11/27 at 0.25 mcg for co-ox 41%), PICC placed (11/27), txf ICU (d/c back to floor 11/28) - Today: co-ox 77% on lasix 40 BID IV with -16lbs and -2.8L since admission (previous wt in Oct 66kg, today 59.4 kg, has been experiencing wt loss). -Will start weaning down milrinone to 0.25 mcg -Hold PM 40 lasix IV until CVP obtained with soft BPs -Monitor CVP, co-ox while on milrinone and dosing diuretics -Once stable will start GMDT (only appears to be on lasix at home, follows with [facility]; No BB secondary to cardiogenic shock; No ARNI/ACEi/ARB secondary to shock; No spiro secondary to shock).   2. CAD: Prior history of MI with LAD and ramus PCI.  Admitted May 2021 with anterior STEMI. LHC with occluded ostial LAD (in-stent), 90% ostial ramus, 60-70% proximal LCx, nondominant RCA.  PTCA to LAD and ramus to restore flow, then CABG with LIMA-LAD and SVG-ramus.  - ASA and plavix recommended minimum 12 months per CTS note.   - Continue statin.   3. Atrial fibrillation:  - Currently on apixaban  - On amiodarone gtt at 30 - Will likely need TEE DC-CV but try to wean down off  milrinone - Keep K+ > 4.0 and Mg2+ > 2.0 - Check TSH  4. SLE: History of SLE pericarditis.   - On home Plaquenil and Imuran.   5. Severe MR, mod TR and mild AR/AS - Will need repeat echo outpatient  6. AKI -Baseline Cr 1 -Peak on 11/25 Cr 1.60 -Today, Cr 1.54 w/ good UOP yesterday on IV lasix -2.2L -Continue to monitor UOP and BMET  7. Pleural effusion s/p thoracentesis 1.1L removed on 11/24 by IR, pLDH 49:sLDH 166 (transudative) thought to be 2/2 hypoalbuminemia and volume overload.   -Currently not requiring oxygen  8.  Pulmonary nodules -PCCM following, appreciate recommendations -s/p Bronch w/ RML BAL and cultures (NGTD x 2 days)   Length of Stay: 6  Carlene Coria, NP  05/02/2020, 1:59 PM  Advanced Heart Failure Team Pager [phone] (M-F; 7a - 4p)  Please contact [facility] for night-coverage after hours (4p -7a ) and weekends on [email or web address]  Agree with above.   74 y/o male with complicated PMHx including SLE, COPD, CAD s/p previous PCIs.    Admitted in 5/21 with anterior STEMI found to have thrombotic occlusion of ostial LAD stent +  90% ISR in ostial ramus with EF 25-30%. L dominant system.  Underwent PTCA of LAD lesion with reduction 100% -> 50% however unable to stent ramus due to protrusion of the previous ramus stents into LM. IABP placed and taken for emergent CABG with LIMA -> LAD and SVG to RI (Dr. Roxy Manns)  Echo in 5/21 EF 45-50% (25% at cath). Post-op course c/b PAF and started on amio (which was eventually stopped).   Has had several admissions since that time. In 10/21 admitted with SOB and CT scan showed possible bilateral lower lobe PE. LV to RV ratio was normal so not felt to have acute RV strain but note made of contrast reflux into the hepatic veins suggesting right heart failure.  Readmitted last week with worsening HF symptoms and FTT. Underwent CT chest with poor opacification of bilateral LL pulmonary arteries. Large right effusion, persistent volume  overload/HF and increasing pulmonary nodules. Underwent thoracentesis and bronch/biopsy.   Echo obtained and EF read as 30-35% (I think 20-25% with moderate RV dysfunction and severe MR, mod-severe TE). PICC placed. Co-ox 41%. Started on milrinone and increased to 0.375. Overnight developed AFL with RVR. Started on IV amio. Today co-ox 77% but SBP in 80-90s.   General:  Cachetic weak appearing. No resp difficulty HEENT: normal + temporal wasting  Neck: supple. JVP 7-8 Carotids 2+ bilat; no bruits. No lymphadenopathy or thryomegaly appreciated. Cor: PMI nondisplaced. Irregular tachy. Soft MR Lungs: decreased at bases Abdomen: soft, nontender, nondistended. Liver edge down slightly . No bruits or masses. Good bowel sounds. Extremities: no cyanosis, clubbing, rash, edema Neuro: alert & orientedx3, cranial nerves grossly intact. moves all 4 extremities w/o difficulty. Affect pleasant  Suspect main issue here is progressive/end-stage HF. Started on milrinone for support but develop AFL with RVR. Milrinone cut back but BP still soft.   Will move to ICU. Cut milrinone back to 0.25. Start low-dose NE to get SBP >= 100. Follow CVP and co-ox. Bolus 150 IV amio and increase gtt to 60/hr. Switch Eliquis to heparin.   Once stabilized can consider repeat cath to ensure patency of grafts but doubt ischemia is major issue currently. Not VAD candidate at this point due to cachexia and RV failure. I worry his options and prognosis are quite limited. Will take it one step at a time.   CRITICAL CARE Performed by: Glori Bickers  Total critical care time: 60 minutes  Critical care time was exclusive of separately billable procedures and treating other patients.  Critical care was necessary to treat or prevent imminent or life-threatening deterioration.  Critical care was time spent personally by me (independent of midlevel providers or residents) on the following activities: development of treatment plan  with patient and/or surrogate as well as nursing, discussions with consultants, evaluation of patient's response to treatment, examination of patient, obtaining history from patient or surrogate, ordering and performing treatments and interventions, ordering and review of laboratory studies, ordering and review of radiographic studies, pulse oximetry and re-evaluation of patient's condition.  Glori Bickers, MD  7:10 PM

## 2020-05-02 NOTE — Progress Notes (Signed)
Cross-Cover Note Cody Arellano [birth year] [id number] 05/02/20  Paged regarding tachyarrhythmia overnight.  Evaluated at bedside.  Resting comfortably, non-tachypneic, having blood drawn and conversational with me.  Blood pressure stable 105/60, heart rates 100-110 range.  ECG, telemetry reviewed.  AFL with variable AV block with rate increased from earlier today.  Can't see prior telemetry/initiation as he transferred back to floor this evening.  Apixaban held briefly at admission for thoracentesis and resumed on heparin after missing two doses.  Of note, he developed pulmonary embolism during prior admission after missing just three doses.  #pAF/AFL on apixbaban.  ICM precluding IC agent.  GFR is borderline to start dofetilide.  Favor rhythm control as I don't suspect he will tolerate the arrhythmia well long-term. - Start amiodarone infusion, no bolus, followed by oral load.  Will have to monitor pulmonary status closely with underlying SLE/ILD. - Continue apixaban - Continue milrinone no rate change. - NPO for now in the event of TEE/DCCV tomorrow if he tolerates the AFL poorly.  If does okay through the morning then can probably defer to once he's been partially loaded.  #Hypokalemia - IV 20 mEq KCL now with amiodarone. - Continue KCL 40 mEq BID  C. Albertha Ghee, MD Cardiology Fellow [facility]

## 2020-05-02 NOTE — Progress Notes (Signed)
Pt's HR sustaining in the 130-140 range, EKG obtained, E-link made aware. Temp: 98, BP 99/60, MAP 69, Pulse 57, HR 118, RR 20, SpO2 96 on RA. Will continue to monitor.   Elaina Hoops, RN

## 2020-05-02 NOTE — Progress Notes (Signed)
Nutrition Follow-up  DOCUMENTATION CODES:   Underweight, Severe malnutrition in context of chronic illness  INTERVENTION:   -Continue Ensure Enlive po TID, each supplement provides 350 kcal and 20 grams of protein -MVI with minerals daily -Reviewed diet with pt. Provided "Heart Failure Nutrition Therapy for the Undernourished" from [facility] Nutrition Care Manual  NUTRITION DIAGNOSIS:   Severe Malnutrition related to chronic illness (CHF) as evidenced by severe muscle depletion, severe fat depletion, percent weight loss.  Ongoing  GOAL:   Patient will meet greater than or equal to 90% of their needs  Progressing  MONITOR:   PO intake, Supplement acceptance, Skin, Weight trends, Labs, I & O's  REASON FOR ASSESSMENT:   Consult Assessment of nutrition requirement/status, Poor PO  ASSESSMENT:   74 y.o. male with medical history significant for lupus, interstitial lung disease, coronary artery disease, chronic systolic CHF, paroxysmal atrial fibrillation presents with shortness of breath. Pt found to have large right-sided pleural effusion and small left-sided pleural effusion. Chest x-ray showed cardiomegaly with pulmonary venous congestion and bilateral interstitial prominence most consistent with interstitial edema.  11/26- s/p Flexible video fiberoptic bronchoscopy and biopsies.  Reviewed I/O's: -1.5 L x 24 hours and -2.8 L since admission  UOP: 2.2 L x 24 hours  Spoke with pt at bedside, who is familiar to this RD from recent prior admission. He reports that his appetite has improved PTA. He continues to consume 3 meal per day (Breakfast: eggs, toast, and fried potatoes; Lunch: salad and hamburger; Dinner: meat, starch, and vegetable). He also consumes 2 Ensure supplements daily. Pt explains that both his son and wife help prepare meals for him and wife is very vigilant about diet restrictions. Noted documented meal completions 80%.   Per pt, he endorses wt loss since having  MI back in May. Reviewed wt hx; pt has experienced a 15% wt loss over the past 3 months, which is significant for time frame. Pt expressed concern over weight loss and suspects scale error. Per his report, he has been able to maintain wt of 146-147# on his home scale over the past month, but has been unable to regain weight he has lost. He has noticed that he has more endurance lately and is encouraged by this.  Discussed importance of continued self-management and weight monitoring. Encouraged pt to continue to drink Ensure supplements. Discussed liberalizing diet to promote oral intake and preserve lean body mass. RD provided handout per pt request.   Labs reviewed: K: 3.0.   NUTRITION - FOCUSED PHYSICAL EXAM:    Most Recent Value  Orbital Region Severe depletion  Upper Arm Region Severe depletion  Thoracic and Lumbar Region Severe depletion  Buccal Region Severe depletion  Temple Region Severe depletion  Clavicle Bone Region Severe depletion  Clavicle and Acromion Bone Region Severe depletion  Scapular Bone Region Severe depletion  Dorsal Hand Severe depletion  Patellar Region Severe depletion  Anterior Thigh Region Severe depletion  Posterior Calf Region Severe depletion  Edema (RD Assessment) None  Hair Reviewed  Eyes Reviewed  Mouth Reviewed  Skin Reviewed  Nails Reviewed       Diet Order:   Diet Order            Diet Heart Room service appropriate? Yes; Fluid consistency: Thin  Diet effective now                 EDUCATION NEEDS:   Education needs have been addressed  Skin:  Skin Assessment: Reviewed RN Assessment  Last BM:  05/01/20  Height:   Ht Readings from Last 1 Encounters:  04/29/20 6' (1.829 m)    Weight:   Wt Readings from Last 1 Encounters:  05/01/20 59.4 kg   BMI:  Body mass index is 17.77 kg/m.  Estimated Nutritional Needs:   Kcal:  2100-2300  Protein:  115-130 grams  Fluid:  > 2 L    Loistine Chance, RD, LDN, [place] Registered  Dietitian II Certified Diabetes Care and Education Specialist Please refer to [facility] for RD and/or RD on-call/weekend/after hours pager

## 2020-05-03 ENCOUNTER — Inpatient Hospital Stay (HOSPITAL_COMMUNITY): Payer: No Typology Code available for payment source

## 2020-05-03 ENCOUNTER — Encounter (HOSPITAL_COMMUNITY): Payer: Self-pay | Admitting: Internal Medicine

## 2020-05-03 ENCOUNTER — Other Ambulatory Visit: Payer: Self-pay

## 2020-05-03 ENCOUNTER — Inpatient Hospital Stay (HOSPITAL_COMMUNITY): Payer: No Typology Code available for payment source | Admitting: Certified Registered Nurse Anesthetist

## 2020-05-03 ENCOUNTER — Encounter (HOSPITAL_COMMUNITY)
Admission: EM | Disposition: A | Discharge status: Rehabilitation Facility | Payer: Self-pay | Source: Home / Self Care | Attending: Cardiology

## 2020-05-03 DIAGNOSIS — I255 Ischemic cardiomyopathy: Secondary | ICD-10-CM

## 2020-05-03 DIAGNOSIS — J90 Pleural effusion, not elsewhere classified: Secondary | ICD-10-CM | POA: Diagnosis not present

## 2020-05-03 DIAGNOSIS — I509 Heart failure, unspecified: Secondary | ICD-10-CM

## 2020-05-03 DIAGNOSIS — Z9911 Dependence on respirator [ventilator] status: Secondary | ICD-10-CM | POA: Diagnosis not present

## 2020-05-03 DIAGNOSIS — N179 Acute kidney failure, unspecified: Secondary | ICD-10-CM | POA: Diagnosis not present

## 2020-05-03 DIAGNOSIS — I130 Hypertensive heart and chronic kidney disease with heart failure and stage 1 through stage 4 chronic kidney disease, or unspecified chronic kidney disease: Principal | ICD-10-CM

## 2020-05-03 DIAGNOSIS — Z95811 Presence of heart assist device: Secondary | ICD-10-CM

## 2020-05-03 DIAGNOSIS — J9601 Acute respiratory failure with hypoxia: Secondary | ICD-10-CM

## 2020-05-03 DIAGNOSIS — R570 Cardiogenic shock: Secondary | ICD-10-CM

## 2020-05-03 DIAGNOSIS — Z515 Encounter for palliative care: Secondary | ICD-10-CM

## 2020-05-03 DIAGNOSIS — I5023 Acute on chronic systolic (congestive) heart failure: Secondary | ICD-10-CM | POA: Diagnosis not present

## 2020-05-03 HISTORY — PX: TEE WITHOUT CARDIOVERSION: SHX5443

## 2020-05-03 HISTORY — PX: PLACEMENT OF IMPELLA LEFT VENTRICULAR ASSIST DEVICE: SHX6519

## 2020-05-03 LAB — BASIC METABOLIC PANEL
Anion gap: 16 — ABNORMAL HIGH (ref 5–15)
Anion gap: 16 — ABNORMAL HIGH (ref 5–15)
BUN: 17 mg/dL (ref 8–23)
BUN: 18 mg/dL (ref 8–23)
CO2: 24 mmol/L (ref 22–32)
CO2: 22 mmol/L (ref 22–32)
Calcium: 8.6 mg/dL — ABNORMAL LOW (ref 8.9–10.3)
Calcium: 8.4 mg/dL — ABNORMAL LOW (ref 8.9–10.3)
Chloride: 93 mmol/L — ABNORMAL LOW (ref 98–111)
Chloride: 97 mmol/L — ABNORMAL LOW (ref 98–111)
Creatinine, Ser: 1.69 mg/dL — ABNORMAL HIGH (ref 0.61–1.24)
Creatinine, Ser: 1.8 mg/dL — ABNORMAL HIGH (ref 0.61–1.24)
GFR, Estimated: 42 mL/min — ABNORMAL LOW (ref >60)
GFR, Estimated: 39 mL/min — ABNORMAL LOW (ref >60)
Glucose, Bld: 323 mg/dL — ABNORMAL HIGH (ref 70–99)
Glucose, Bld: 114 mg/dL — ABNORMAL HIGH (ref 70–99)
Potassium: 4.4 mmol/L (ref 3.5–5.1)
Potassium: 3.7 mmol/L (ref 3.5–5.1)
Sodium: 133 mmol/L — ABNORMAL LOW (ref 135–145)
Sodium: 135 mmol/L (ref 135–145)

## 2020-05-03 LAB — POCT I-STAT 7, (LYTES, BLD GAS, ICA,H+H)
Acid-base deficit: 2 mmol/L (ref 0.0–2.0)
Acid-base deficit: 1 mmol/L (ref 0.0–2.0)
Bicarbonate: 26.1 mmol/L (ref 20.0–28.0)
Bicarbonate: 25.9 mmol/L (ref 20.0–28.0)
Calcium, Ion: 1.18 mmol/L (ref 1.15–1.40)
Calcium, Ion: 1.12 mmol/L — ABNORMAL LOW (ref 1.15–1.40)
HCT: 44 % (ref 39.0–52.0)
HCT: 40 % (ref 39.0–52.0)
Hemoglobin: 15 g/dL (ref 13.0–17.0)
Hemoglobin: 13.6 g/dL (ref 13.0–17.0)
O2 Saturation: 93 %
O2 Saturation: 100 %
Patient temperature: 35.5
Potassium: 4.7 mmol/L (ref 3.5–5.1)
Potassium: 3.6 mmol/L (ref 3.5–5.1)
Sodium: 138 mmol/L (ref 135–145)
Sodium: 138 mmol/L (ref 135–145)
TCO2: 28 mmol/L (ref 22–32)
TCO2: 27 mmol/L (ref 22–32)
pCO2 arterial: 55.2 mmHg — ABNORMAL HIGH (ref 32.0–48.0)
pCO2 arterial: 46.5 mmHg (ref 32.0–48.0)
pH, Arterial: 7.282 — ABNORMAL LOW (ref 7.350–7.450)
pH, Arterial: 7.347 — ABNORMAL LOW (ref 7.350–7.450)
pO2, Arterial: 78 mmHg — ABNORMAL LOW (ref 83.0–108.0)
pO2, Arterial: 414 mmHg — ABNORMAL HIGH (ref 83.0–108.0)

## 2020-05-03 LAB — PREPARE RBC (CROSSMATCH)

## 2020-05-03 LAB — COOXEMETRY PANEL
Carboxyhemoglobin: 0.8 % (ref 0.5–1.5)
Carboxyhemoglobin: 0.8 % (ref 0.5–1.5)
Carboxyhemoglobin: 1 % (ref 0.5–1.5)
Methemoglobin: 0.6 % (ref 0.0–1.5)
Methemoglobin: 0.7 % (ref 0.0–1.5)
Methemoglobin: 0.7 % (ref 0.0–1.5)
O2 Saturation: 30.3 %
O2 Saturation: 37.5 %
O2 Saturation: 61.3 %
Total hemoglobin: 12.3 g/dL (ref 12.0–16.0)
Total hemoglobin: 12.3 g/dL (ref 12.0–16.0)
Total hemoglobin: 11.7 g/dL — ABNORMAL LOW (ref 12.0–16.0)

## 2020-05-03 LAB — HEPARIN LEVEL (UNFRACTIONATED): Heparin Unfractionated: >2.2 IU/mL — ABNORMAL HIGH (ref 0.30–0.70)

## 2020-05-03 LAB — CBC
HCT: 40.7 % (ref 39.0–52.0)
HCT: 37.5 % — ABNORMAL LOW (ref 39.0–52.0)
Hemoglobin: 11.6 g/dL — ABNORMAL LOW (ref 13.0–17.0)
Hemoglobin: 11 g/dL — ABNORMAL LOW (ref 13.0–17.0)
MCH: 26.2 pg (ref 26.0–34.0)
MCH: 26.4 pg (ref 26.0–34.0)
MCHC: 28.5 g/dL — ABNORMAL LOW (ref 30.0–36.0)
MCHC: 29.3 g/dL — ABNORMAL LOW (ref 30.0–36.0)
MCV: 92.1 fL (ref 80.0–100.0)
MCV: 90.1 fL (ref 80.0–100.0)
Platelets: 190 10*3/uL (ref 150–400)
Platelets: 181 10*3/uL (ref 150–400)
RBC: 4.42 MIL/uL (ref 4.22–5.81)
RBC: 4.16 MIL/uL — ABNORMAL LOW (ref 4.22–5.81)
RDW: 17.1 % — ABNORMAL HIGH (ref 11.5–15.5)
RDW: 17 % — ABNORMAL HIGH (ref 11.5–15.5)
WBC: 5.2 10*3/uL (ref 4.0–10.5)
WBC: 6.8 10*3/uL (ref 4.0–10.5)
nRBC: 0.4 % — ABNORMAL HIGH (ref 0.0–0.2)
nRBC: 0.4 % — ABNORMAL HIGH (ref 0.0–0.2)

## 2020-05-03 LAB — ECHO INTRAOPERATIVE TEE
Height: 72 in
Weight: 2119.94 oz

## 2020-05-03 LAB — LACTIC ACID, PLASMA
Lactic Acid, Venous: 5.6 mmol/L (ref 0.5–1.9)
Lactic Acid, Venous: 4.6 mmol/L (ref 0.5–1.9)

## 2020-05-03 LAB — GLUCOSE, CAPILLARY
Glucose-Capillary: 120 mg/dL — ABNORMAL HIGH (ref 70–99)
Glucose-Capillary: 95 mg/dL (ref 70–99)

## 2020-05-03 LAB — APTT: aPTT: 81 seconds — ABNORMAL HIGH (ref 24–36)

## 2020-05-03 LAB — MRSA PCR SCREENING: MRSA by PCR: NEGATIVE

## 2020-05-03 LAB — ECHOCARDIOGRAM LIMITED
Height: 72 in
Weight: 2119.94 oz

## 2020-05-03 LAB — ANCA TITERS
Atypical P-ANCA titer: <1:20 {titer}
C-ANCA: <1:20 {titer}
P-ANCA: <1:20 {titer}

## 2020-05-03 LAB — TSH: TSH: 4.438 u[IU]/mL (ref 0.350–4.500)

## 2020-05-03 LAB — ASPERGILLUS ANTIGEN, BAL/SERUM: Aspergillus Ag, BAL/Serum: 0.09 Index (ref 0.00–0.49)

## 2020-05-03 LAB — TRIGLYCERIDES: Triglycerides: 65 mg/dL (ref <150)

## 2020-05-03 SURGERY — INSERTION, CARDIAC ASSIST DEVICE, IMPELLA
Anesthesia: General | Site: Chest | Laterality: Right

## 2020-05-03 MED ORDER — ALBUMIN HUMAN 5 % IV SOLN: INTRAVENOUS | Status: DC | PRN

## 2020-05-03 MED ORDER — SUCCINYLCHOLINE CHLORIDE 200 MG/10ML IV SOSY
PREFILLED_SYRINGE | INTRAVENOUS | Status: AC
  Filled 2020-05-03: qty 10

## 2020-05-03 MED ORDER — SODIUM CHLORIDE 0.9 % IV BOLUS
250.0000 mL | Freq: Once | INTRAVENOUS | Status: AC
  Administered 2020-05-03: 250 mL via INTRAVENOUS

## 2020-05-03 MED ORDER — HEPARIN SODIUM (PORCINE) 5000 UNIT/ML IJ SOLN: INTRAVENOUS | Status: DC

## 2020-05-03 MED ORDER — SODIUM CHLORIDE 0.9 % IV SOLN
750.0000 mg | INTRAVENOUS | Status: DC
  Filled 2020-05-03 (×2): qty 750

## 2020-05-03 MED ORDER — ETOMIDATE 2 MG/ML IV SOLN
INTRAVENOUS | Status: DC | PRN
  Administered 2020-05-03: 14 mg via INTRAVENOUS

## 2020-05-03 MED ORDER — MEGESTROL ACETATE 40 MG PO TABS
40.0000 mg | ORAL_TABLET | Freq: Every day | ORAL | Status: DC
  Filled 2020-05-03: qty 1

## 2020-05-03 MED ORDER — FENTANYL CITRATE (PF) 250 MCG/5ML IJ SOLN
INTRAMUSCULAR | Status: DC | PRN
  Administered 2020-05-03: 50 ug via INTRAVENOUS

## 2020-05-03 MED ORDER — FUROSEMIDE 10 MG/ML IJ SOLN
40.0000 mg | Freq: Once | INTRAMUSCULAR | Status: AC
  Administered 2020-05-03: 40 mg via INTRAVENOUS
  Filled 2020-05-03: qty 4

## 2020-05-03 MED ORDER — PHENYLEPHRINE 40 MCG/ML (10ML) SYRINGE FOR IV PUSH (FOR BLOOD PRESSURE SUPPORT)
PREFILLED_SYRINGE | INTRAVENOUS | Status: AC
  Filled 2020-05-03: qty 10

## 2020-05-03 MED ORDER — PHENYLEPHRINE 40 MCG/ML (10ML) SYRINGE FOR IV PUSH (FOR BLOOD PRESSURE SUPPORT)
PREFILLED_SYRINGE | INTRAVENOUS | Status: DC | PRN
  Administered 2020-05-03: 80 ug via INTRAVENOUS
  Administered 2020-05-03 (×2): 120 ug via INTRAVENOUS

## 2020-05-03 MED ORDER — HEMOSTATIC AGENTS (NO CHARGE) OPTIME
TOPICAL | Status: DC | PRN
  Administered 2020-05-03: 1 via TOPICAL

## 2020-05-03 MED ORDER — ORAL CARE MOUTH RINSE
15.0000 mL | OROMUCOSAL | Status: DC
  Administered 2020-05-03 – 2020-05-05 (×18): 15 mL via OROMUCOSAL

## 2020-05-03 MED ORDER — MIDAZOLAM HCL 2 MG/2ML IJ SOLN
INTRAMUSCULAR | Status: AC
  Filled 2020-05-03: qty 2

## 2020-05-03 MED ORDER — LIDOCAINE HCL (PF) 2 % IJ SOLN
INTRAMUSCULAR | Status: AC
  Filled 2020-05-03: qty 5

## 2020-05-03 MED ORDER — ASPIRIN 81 MG PO CHEW
81.0000 mg | CHEWABLE_TABLET | Freq: Every day | ORAL | Status: DC
  Administered 2020-05-03 – 2020-05-05 (×3): 81 mg
  Filled 2020-05-03 (×3): qty 1

## 2020-05-03 MED ORDER — ETOMIDATE 2 MG/ML IV SOLN
INTRAVENOUS | Status: AC
  Filled 2020-05-03: qty 10

## 2020-05-03 MED ORDER — FENTANYL CITRATE (PF) 250 MCG/5ML IJ SOLN
INTRAMUSCULAR | Status: AC
  Filled 2020-05-03: qty 10

## 2020-05-03 MED ORDER — MILRINONE LACTATE IN DEXTROSE 20-5 MG/100ML-% IV SOLN
0.3750 ug/kg/min | INTRAVENOUS | Status: DC
  Administered 2020-05-03 – 2020-05-04 (×2): 0.375 ug/kg/min via INTRAVENOUS
  Filled 2020-05-03 (×2): qty 100

## 2020-05-03 MED ORDER — 0.9 % SODIUM CHLORIDE (POUR BTL) OPTIME
TOPICAL | Status: DC | PRN
  Administered 2020-05-03: 4000 mL

## 2020-05-03 MED ORDER — HEPARIN SODIUM (PORCINE) 5000 UNIT/ML IJ SOLN
INTRAVENOUS | Status: DC
  Filled 2020-05-03 (×4): qty 10

## 2020-05-03 MED ORDER — ROSUVASTATIN CALCIUM 20 MG PO TABS
20.0000 mg | ORAL_TABLET | Freq: Every day | ORAL | Status: DC
  Administered 2020-05-04 – 2020-05-05 (×2): 20 mg
  Filled 2020-05-03 (×2): qty 1

## 2020-05-03 MED ORDER — HEPARIN SODIUM (PORCINE) 5000 UNIT/ML IJ SOLN
INTRAVENOUS | Status: AC | PRN
  Administered 2020-05-03: 645 [IU]/h

## 2020-05-03 MED ORDER — SODIUM CHLORIDE 0.9 % IV SOLN
INTRAVENOUS | Status: DC | PRN
  Administered 2020-05-03: 09:00:00 500 mL

## 2020-05-03 MED ORDER — SODIUM CHLORIDE 0.9 % IV SOLN
INTRAVENOUS | Status: AC
  Filled 2020-05-03: qty 1.2

## 2020-05-03 MED ORDER — DOCUSATE SODIUM 50 MG/5ML PO LIQD
100.0000 mg | Freq: Two times a day (BID) | ORAL | Status: DC
  Administered 2020-05-03 – 2020-05-05 (×4): 100 mg
  Filled 2020-05-03 (×4): qty 10

## 2020-05-03 MED ORDER — LIDOCAINE 2% (20 MG/ML) 5 ML SYRINGE
INTRAMUSCULAR | Status: DC | PRN
  Administered 2020-05-03: 100 mg via INTRAVENOUS

## 2020-05-03 MED ORDER — HYDROXYCHLOROQUINE SULFATE 200 MG PO TABS
400.0000 mg | ORAL_TABLET | Freq: Every day | ORAL | Status: DC
  Administered 2020-05-03: 400 mg
  Filled 2020-05-03: qty 2

## 2020-05-03 MED ORDER — EPINEPHRINE 1 MG/10ML IJ SOSY
PREFILLED_SYRINGE | INTRAMUSCULAR | Status: DC | PRN
  Administered 2020-05-03 (×2): .01 mg via INTRAVENOUS

## 2020-05-03 MED ORDER — FUROSEMIDE 10 MG/ML IJ SOLN
60.0000 mg | Freq: Two times a day (BID) | INTRAMUSCULAR | Status: DC
  Administered 2020-05-03: 60 mg via INTRAVENOUS
  Filled 2020-05-03: qty 6

## 2020-05-03 MED ORDER — ENSURE ENLIVE PO LIQD
237.0000 mL | Freq: Three times a day (TID) | ORAL | Status: DC
  Administered 2020-05-03: 237 mL

## 2020-05-03 MED ORDER — EPINEPHRINE 1 MG/10ML IJ SOSY
PREFILLED_SYRINGE | INTRAMUSCULAR | Status: AC
  Filled 2020-05-03: qty 10

## 2020-05-03 MED ORDER — INSULIN REGULAR(HUMAN) IN NACL 100-0.9 UT/100ML-% IV SOLN
INTRAVENOUS | Status: DC
  Filled 2020-05-03: qty 100

## 2020-05-03 MED ORDER — PROPOFOL 1000 MG/100ML IV EMUL
5.0000 ug/kg/min | INTRAVENOUS | Status: DC
  Administered 2020-05-03: 10 ug/kg/min via INTRAVENOUS
  Administered 2020-05-04 (×2): 15 ug/kg/min via INTRAVENOUS
  Filled 2020-05-03 (×3): qty 100

## 2020-05-03 MED ORDER — ADULT MULTIVITAMIN W/MINERALS CH
1.0000 | ORAL_TABLET | Freq: Every day | ORAL | Status: DC
  Administered 2020-05-04 – 2020-05-05 (×2): 1
  Filled 2020-05-03 (×2): qty 1

## 2020-05-03 MED ORDER — CHLORHEXIDINE GLUCONATE 0.12% ORAL RINSE (MEDLINE KIT)
15.0000 mL | Freq: Two times a day (BID) | OROMUCOSAL | Status: DC
  Administered 2020-05-03 – 2020-05-05 (×4): 15 mL via OROMUCOSAL

## 2020-05-03 MED ORDER — SUCCINYLCHOLINE CHLORIDE 200 MG/10ML IV SOSY
PREFILLED_SYRINGE | INTRAVENOUS | Status: DC | PRN
  Administered 2020-05-03: 120 mg via INTRAVENOUS

## 2020-05-03 MED ORDER — POTASSIUM CHLORIDE 20 MEQ PO PACK
40.0000 meq | PACK | Freq: Once | ORAL | Status: AC
  Administered 2020-05-03: 40 meq
  Filled 2020-05-03: qty 2

## 2020-05-03 MED ORDER — CEFUROXIME SODIUM 1.5 G IV SOLR
1.5000 g | INTRAVENOUS | Status: DC
  Filled 2020-05-03: qty 1.5

## 2020-05-03 MED ORDER — CEFAZOLIN SODIUM-DEXTROSE 2-4 GM/100ML-% IV SOLN
2.0000 g | INTRAVENOUS | Status: DC
  Filled 2020-05-03: qty 100

## 2020-05-03 MED ORDER — LACTATED RINGERS IV SOLN: INTRAVENOUS | Status: DC | PRN

## 2020-05-03 MED ORDER — HEPARIN SODIUM (PORCINE) 1000 UNIT/ML IJ SOLN
INTRAMUSCULAR | Status: AC
  Filled 2020-05-03: qty 1

## 2020-05-03 MED ORDER — SODIUM CHLORIDE 0.9 % IV SOLN: INTRAVENOUS | Status: DC | PRN

## 2020-05-03 MED ORDER — VANCOMYCIN HCL IN DEXTROSE 1-5 GM/200ML-% IV SOLN
1000.0000 mg | INTRAVENOUS | Status: DC
  Filled 2020-05-03: qty 200

## 2020-05-03 MED ORDER — SODIUM CHLORIDE 0.9 % IV SOLN
1.5000 g | INTRAVENOUS | Status: AC
  Administered 2020-05-03: 1.5 g via INTRAVENOUS
  Filled 2020-05-03: qty 1.5

## 2020-05-03 MED ORDER — OXYCODONE HCL 5 MG PO TABS
5.0000 mg | ORAL_TABLET | Freq: Four times a day (QID) | ORAL | Status: DC | PRN
  Administered 2020-05-04 – 2020-05-05 (×2): 5 mg
  Filled 2020-05-03 (×2): qty 1

## 2020-05-03 MED ORDER — POLYETHYLENE GLYCOL 3350 17 G PO PACK: 17.0000 g | PACK | Freq: Every day | ORAL | Status: DC | PRN

## 2020-05-03 MED ORDER — MIDAZOLAM HCL 5 MG/5ML IJ SOLN
INTRAMUSCULAR | Status: DC | PRN
  Administered 2020-05-03 (×3): 1 mg via INTRAVENOUS

## 2020-05-03 MED ORDER — ROCURONIUM BROMIDE 10 MG/ML (PF) SYRINGE
PREFILLED_SYRINGE | INTRAVENOUS | Status: DC | PRN
  Administered 2020-05-03: 40 mg via INTRAVENOUS
  Administered 2020-05-03: 10 mg via INTRAVENOUS

## 2020-05-03 MED ORDER — HEPARIN SODIUM (PORCINE) 1000 UNIT/ML IJ SOLN
INTRAMUSCULAR | Status: DC | PRN
  Administered 2020-05-03: 5000 [IU] via INTRAVENOUS

## 2020-05-03 MED ORDER — POTASSIUM CHLORIDE 20 MEQ/15ML (10%) PO SOLN: 40.0000 meq | Freq: Once | ORAL | Status: DC

## 2020-05-03 MED ORDER — ROCURONIUM BROMIDE 10 MG/ML (PF) SYRINGE
PREFILLED_SYRINGE | INTRAVENOUS | Status: AC
  Filled 2020-05-03: qty 10

## 2020-05-03 SURGICAL SUPPLY — 55 items
ADH SKN CLS APL DERMABOND .7 (GAUZE/BANDAGES/DRESSINGS) ×2
ANCHOR CATH FOLEY SECURE (MISCELLANEOUS) ×9 IMPLANT
APL SRG 7X2 LUM MLBL SLNT (VASCULAR PRODUCTS)
APPLICATOR TIP COSEAL (VASCULAR PRODUCTS) IMPLANT
BLADE CLIPPER SURG (BLADE) ×3 IMPLANT
CATH ACCU-VU SIZ PIG 5F 100CM (CATHETERS) ×3 IMPLANT
CATH DIAG EXPO 6F AL1 (CATHETERS) ×3 IMPLANT
CATH DIAG EXPO 6F FR4 (CATHETERS) ×3 IMPLANT
CATH INFINITI 6F MPB2 (CATHETERS) ×3 IMPLANT
CLIP LIGATING EXTRA MED SLVR (CLIP) ×3 IMPLANT
DERMABOND ADVANCED (GAUZE/BANDAGES/DRESSINGS) ×1
DERMABOND ADVANCED .7 DNX12 (GAUZE/BANDAGES/DRESSINGS) ×2 IMPLANT
DRAPE C-ARM 42X72 X-RAY (DRAPES) ×3 IMPLANT
DRAPE CV SPLIT W-CLR ANES SCRN (DRAPES) ×3 IMPLANT
DRAPE PERI GROIN 82X75IN TIB (DRAPES) ×3 IMPLANT
DRAPE SLUSH/WARMER DISC (DRAPES) ×3 IMPLANT
ELECT BLADE 4.0 EZ CLEAN MEGAD (MISCELLANEOUS) ×3
ELECTRODE BLDE 4.0 EZ CLN MEGD (MISCELLANEOUS) ×2 IMPLANT
FELT TEFLON 1X6 (MISCELLANEOUS) ×3 IMPLANT
GAUZE SPONGE 4X4 12PLY STRL (GAUZE/BANDAGES/DRESSINGS) ×3 IMPLANT
GLOVE NEODERM STRL 7.5 LF PF (GLOVE) ×6 IMPLANT
GLOVE SURG NEODERM 7.5  LF PF (GLOVE) ×3
GOWN STRL REUS W/ TWL LRG LVL3 (GOWN DISPOSABLE) ×8 IMPLANT
GOWN STRL REUS W/TWL LRG LVL3 (GOWN DISPOSABLE) ×12
GRAFT GELWEAVE IMPREG 10X30CM (Prosthesis & Implant Heart) ×3 IMPLANT
INSERT FOGARTY SM (MISCELLANEOUS) ×6 IMPLANT
KIT BASIN OR (CUSTOM PROCEDURE TRAY) ×3 IMPLANT
LIGACLIP SM TITANIUM (CLIP) ×3 IMPLANT
LOOP VESSEL MINI RED (MISCELLANEOUS) ×3 IMPLANT
NS IRRIG 1000ML POUR BTL (IV SOLUTION) ×12 IMPLANT
PACK OPEN HEART (CUSTOM PROCEDURE TRAY) ×3 IMPLANT
PAD ARMBOARD 7.5X6 YLW CONV (MISCELLANEOUS) ×6 IMPLANT
PAD ELECT DEFIB RADIOL ZOLL (MISCELLANEOUS) ×3 IMPLANT
PUMP SET IMPELLA 5.5 US (CATHETERS) ×3 IMPLANT
SEALANT SURG COSEAL 8ML (VASCULAR PRODUCTS) ×3 IMPLANT
STAPLER VISISTAT 35W (STAPLE) IMPLANT
SUT ETHILON 3 0 PS 1 (SUTURE) ×6 IMPLANT
SUT MNCRL AB 4-0 PS2 18 (SUTURE) ×3 IMPLANT
SUT PDS AB 1 CTX 36 (SUTURE) ×3 IMPLANT
SUT PROLENE 5 0 C 1 36 (SUTURE) ×6 IMPLANT
SUT SILK  1 MH (SUTURE) ×3
SUT SILK 1 MH (SUTURE) ×6 IMPLANT
SUT SILK 1 TIES 10X30 (SUTURE) ×6 IMPLANT
SUT SILK 2 0 SH CR/8 (SUTURE) ×3 IMPLANT
SUT VIC AB 2-0 CT1 27 (SUTURE) ×3
SUT VIC AB 2-0 CT1 TAPERPNT 27 (SUTURE) ×2 IMPLANT
SYR BULB IRRIG 60ML STRL (SYRINGE) ×3 IMPLANT
TAPE CLOTH SURG 4X10 WHT LF (GAUZE/BANDAGES/DRESSINGS) ×3 IMPLANT
TOWEL GREEN STERILE (TOWEL DISPOSABLE) ×3 IMPLANT
TOWEL GREEN STERILE FF (TOWEL DISPOSABLE) ×3 IMPLANT
TRAY FOLEY SLVR 16FR TEMP STAT (SET/KITS/TRAYS/PACK) ×3 IMPLANT
TUBE CONNECTING 20X1/4 (TUBING) ×3 IMPLANT
WATER STERILE IRR 1000ML POUR (IV SOLUTION) ×3 IMPLANT
WIRE EMERALD 3MM-J .035X150CM (WIRE) ×3 IMPLANT
WIRE EMERALD 3MM-J .035X260CM (WIRE) ×3 IMPLANT

## 2020-05-03 NOTE — Progress Notes (Signed)
Patient complaining of tightness in chest, SOB, nausea, and stomach cramping. Paged fellow and he ordered an extra dose of zosyn. Labs were drawn and EKG was obtained.

## 2020-05-03 NOTE — Progress Notes (Signed)
RN changed patient linens during bath and at this time there was an "unknown Impella position alarm." This had happened previously with a turn and resolved on it's own. Impella rep was aware. This second time the alarm did not resolve and Adam, Impella rep, was called again. While on the phone we received suction alarms. Patient had voided 600 mls post lasix push. CVP 5-7. BP stable. Centimeter marking on Impella was unchanged. Dr. Aundra Dubin notified who came to see patient. Echo confirmed correct placement of Impella. RN administered 250 mls NS bolus and adjusted amio and milrinone dosages per MD orders. Repeat labs sent. Will continue to closely monitor and turn patient minimally.   Joellen Jersey, RN

## 2020-05-03 NOTE — Progress Notes (Signed)
CRITICAL VALUE ALERT  Critical Value:  Lactic acid 5.6  Date & Time Notified:  05/03/20 1916  Provider Notified: Night shift RN to notify MD.  Orders Received/Actions taken: Will monitor.

## 2020-05-03 NOTE — Progress Notes (Signed)
Patient ID: Cody Arellano, male   DOB: [birth year], 74 y.o.   MRN: [id number]     Advanced Heart Failure Rounding Note  PCP-Cardiologist: Mertie Moores, MD   Subjective:    This morning, feels nauseated.  Had some vomiting overnight.  +Orthopnea, dyspnea.    Creatinine 1.59 => 1.69.  Diuretics held with relatively low CVP last night.   Currently on NE at 8, milrinone 0.125.  Co-ox low at 30%.  CVP 11-12 on my read.  SBP 80s-90s.   He is on amiodarone gtt 60 mg/hr with HR in 100s-110s.   I reviewed echo: EF 20-25% with septal akinesis, mid to apical inferior akinesis, apical anterior and apex akinesis.  Moderately dilated and dysfunctional RV.  Moderate TR.  Severe MR.  Bicuspid aortic valve with no stenosis, mild aortic insufficiency.    Objective:   Weight Range: 60.1 kg Body mass index is 17.97 kg/m.   Vital Signs:   Temp:  [97.4 F (36.3 C)-98.7 F (37.1 C)] 97.4 F (36.3 C) (11/30 0357) Pulse Rate:  [60-129] 104 (11/30 0700) Resp:  [14-39] 28 (11/30 0700) BP: (74-109)/(46-76) 94/73 (11/30 0700) SpO2:  [90 %-99 %] 93 % (11/30 0700) Weight:  [60.1 kg] 60.1 kg (11/30 0500) Last BM Date: 05/01/20  Weight change: Filed Weights   04/30/20 1000 05/01/20 2221 05/03/20 0500  Weight: 62.9 kg 59.4 kg 60.1 kg    Intake/Output:   Intake/Output Summary (Last 24 hours) at 05/03/2020 0742 Last data filed at 05/03/2020 0400 Gross per 24 hour  Intake 1073.17 ml  Output 200 ml  Net 873.17 ml      Physical Exam    General:  Sitting up in bed.  HEENT: Normal Neck: Supple. JVP 10 cm. Carotids 2+ bilat; no bruits. No lymphadenopathy or thyromegaly appreciated. Cor: PMI lateral. Mildly tachy, irregular rate & rhythm. No rubs, gallops. 2/6 HSM apex. Lungs: Clear Abdomen: Soft, nontender, nondistended. No hepatosplenomegaly. No bruits or masses. Good bowel sounds. Extremities: No cyanosis, clubbing, rash, edema.  Neuro: Alert & orientedx3, cranial nerves grossly intact. moves  all 4 extremities w/o difficulty. Affect pleasant   Telemetry   Atrial fibrillation 100s-110s.  Personally reviewed.   Labs    CBC Recent Labs    05/02/20 0444 05/03/20 0349  WBC 3.4* 5.2  HGB 11.2* 11.6*  HCT 37.6* 40.7  MCV 89.5 92.1  PLT 160 485   Basic Metabolic Panel Recent Labs    05/01/20 0528 05/01/20 2000 05/02/20 1410 05/02/20 1410 05/02/20 1856 05/03/20 0349  NA 143   < > 137   < > 138 133*  K 2.9*   < > 3.0*   < > 3.3* 4.4  CL 100   < > 97*   < > 99 93*  CO2 29   < > 29   < > 30 24  GLUCOSE 100*   < > 191*   < > 183* 323*  BUN 15   < > 12   < > 15 17  CREATININE 1.47*   < > 1.55*   < > 1.59* 1.69*  CALCIUM 8.8*   < > 8.4*   < > 8.3* 8.6*  MG 2.1  --  2.0  --   --   --    < > = values in this interval not displayed.   Liver Function Tests No results for input(s): AST, ALT, ALKPHOS, BILITOT, PROT, ALBUMIN in the last 72 hours. No results for input(s): LIPASE, AMYLASE in the last  72 hours. Cardiac Enzymes No results for input(s): CKTOTAL, CKMB, CKMBINDEX, TROPONINI in the last 72 hours.  BNP: BNP (last 3 results) Recent Labs    04/06/20 2134 04/26/20 1319 04/30/20 0248  BNP 1,677.6* 1,824.4* 1,685.5*    ProBNP (last 3 results) No results for input(s): PROBNP in the last 8760 hours.   D-Dimer No results for input(s): DDIMER in the last 72 hours. Hemoglobin A1C No results for input(s): HGBA1C in the last 72 hours. Fasting Lipid Panel No results for input(s): CHOL, HDL, LDLCALC, TRIG, CHOLHDL, LDLDIRECT in the last 72 hours. Thyroid Function Tests Recent Labs    05/03/20 0349  TSH 4.438    Other results:   Imaging     No results found.   Medications:     Scheduled Medications: . aspirin EC  81 mg Oral Daily  . azaTHIOprine  100 mg Oral BID  . Chlorhexidine Gluconate Cloth  6 each Topical Daily  . docusate sodium  100 mg Oral BID  . feeding supplement  237 mL Oral TID BM  . furosemide  60 mg Intravenous BID  .  hydroxychloroquine  400 mg Oral QHS  . megestrol  40 mg Oral Daily  . multivitamin with minerals  1 tablet Oral Daily  . rosuvastatin  20 mg Oral Daily  . sodium chloride flush  10-40 mL Intracatheter Q12H  . tamsulosin  0.4 mg Oral QPM     Infusions: . sodium chloride Stopped (05/01/20 1559)  . sodium chloride    . amiodarone 60 mg/hr (05/03/20 0400)  . heparin 800 Units/hr (05/03/20 0400)  . lactated ringers Stopped (05/01/20 1600)  . milrinone 0.125 mcg/kg/min (05/03/20 0120)  . norepinephrine (LEVOPHED) Adult infusion 8 mcg/min (05/03/20 0400)     PRN Medications:  Place/Maintain arterial line **AND** sodium chloride, bisacodyl, lidocaine, ondansetron (ZOFRAN) IV, oxyCODONE, polyethylene glycol, sodium chloride flush   Assessment/Plan   1. 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  Echo this admission looks worse than 10/21 with EF 20-25% with septal akinesis, mid to apical inferior akinesis, apical anterior and apex akinesis, moderately dilated and dysfunctional RV, moderate TR, severe MR, bicuspid aortic valve with no stenosis, mild aortic insufficiency. Progressive/end-stage CHF worsened by onset of atrial fibrillation. This morning, co-ox down to 30% on milrinone 0.125 and NE 8.  SBP 80s-90s.  CVP 11-12.  - Increase milrinone to 0.25 mcg/kg/min, repeat co-ox.  - Titrate NE as needed.  - Will place arterial line for better assessment of BP.  - Lasix 60 mg IV bid, follow creatinine carefully.  - I am very concerned about this situation, low cardiac output by co-ox in setting of AF/RVR with severe MR and biventricular failure.  I am concerned for end-stage CHF, he is thin/cachectic.  We are not at this point making progress with medical management and I a worry that renal function will continue to worsen.  I discussed with [facility] (Dr Orvan Seen) placement of Impella 5.5 to buy [place] some time to stabilize him and hopefully assess his coronaries and get him  out of atrial fibrillation by limiting vasoactive meds.  IABP would limit mobility and utility would be limited with RVR.  He is not currently an LVAD candidate, but may be able to stabilize to cardiovert, assess coronaries, and potentially eventually Mitraclip.  2. CAD: Prior history of MI with LAD and ramus PCI. Admitted May 2021 with anterior STEMI. LHC with occluded ostial LAD (in-stent), 90% ostial ramus, 60-70% proximal LCx, nondominant RCA. PTCA to  LAD and ramus to restore flow, then CABG with LIMA-LAD and SVG-ramus.  No chest pain, doubt ACS.  Fall in EF from 10/21 to 11/21 may be due to worsening CAD but he did not present with ACS.   - Continue ASA 81 and statin.  - Would eventually like to assess coronaries when more stable and creatinine has settled.  3. AKI: Creatinine mildly higher at 1.69, suspect cardiorenal.  4. Right pleural effusion: S/p thoracentesis, transudative (CHF).  5. Atrial fibrillation with RVR: Mild RVR this morning, he is on amiodarone gtt 60 mg/hr and heparin gtt.  - Continue amiodarone gtt for rate control.  - Eventual TEE-guided DCCV but do not think DCCV would hold right now with vasoactive meds.  6. Mitral regurgitation: Severe, ?infarct-related MR. - ?Eventual Mitraclip candidate if he can be stabilized.  7. H/o PE: In 10/21.  Keep anticoagulated.  8. Bicuspid aortic valve: Mild AI, no AS.  9. SLE: H/o pericarditis.  On Imuran and hydroxychloroquine.  10. Pulmonary nodules/emphysema: CCM following, s/p bronch/BAL (no growth).   Very difficult situation.  Failing medical management with cardiogenic shock and AF/RVR.  At this point, does not look like LVAD candidate.  Think we have a narrow window to stabilize him before he progressively downtrends.  I would favor Impella 5.5 to allow stabilization and then cardioversion and coronary assessment.  We may not be able to turn this situation around long-term, but think we need some time to try to manage  him.  CRITICAL CARE Performed by: Loralie Champagne  Total critical care time: 45 minutes  Critical care time was exclusive of separately billable procedures and treating other patients.  Critical care was necessary to treat or prevent imminent or life-threatening deterioration.  Critical care was time spent personally by me on the following activities: development of treatment plan with patient and/or surrogate as well as nursing, discussions with consultants, evaluation of patient's response to treatment, examination of patient, obtaining history from patient or surrogate, ordering and performing treatments and interventions, ordering and review of laboratory studies, ordering and review of radiographic studies, pulse oximetry and re-evaluation of patient's condition.    Length of Stay: 7  Loralie Champagne, MD  05/03/2020, 7:42 AM  Advanced Heart Failure Team Pager [phone] (M-F; 7a - 4p)  Please contact [facility] for night-coverage after hours (4p -7a ) and weekends on [email or web address]

## 2020-05-03 NOTE — Progress Notes (Signed)
  Echocardiogram Echocardiogram Transesophageal has been performed.  Cody Arellano 05/03/2020, 1:15 PM

## 2020-05-03 NOTE — Progress Notes (Signed)
NAME:  Cody Arellano, MRN:  [id number], DOB:  1946-03-08, LOS: 7 ADMISSION DATE:  04/26/2020, CONSULTATION DATE:  05/03/20 REFERRING MD:  Geradine Girt, DO, CHIEF COMPLAINT:  DOE   Brief History   Patient is a 74 year old male I was asked to evaluate at bedside due to hypotension felt secondary to cardiogenic shock.  He currently is being evaluated by pulmonary critical care recurrent effusion and pulmonary nodules.  History of present illness   Patient was admitted with pleural effusion cardiomyopathy with ejection fraction of 30%, chronic A. fib and SLE on Imuran and Plaquenil.  Patient developed hypotension worse than baseline this evening.  On my evaluation the patient is awake alert interactive says he feels weak and tired.  He does not complain of dyspnea.  O2 saturation in the high 90s on room air.  He is not breathing rapidly nor using accessory muscles.  Chest x-ray shows increased interstitial markings bilaterally blood pressure is 92/70.  Hemoglobin is 11 and creatinine is stable at 1.49, platelet count 180.  Plan is for transfer to the ICU and initiation of milrinone.  BNP is 1600.  Past Medical History  CHF w/ reduced EF, mitral regurg, CAD, Afib, SLE  Significant Hospital Events   Admitted 11/23 thora 11/23  Consults:  PCCM VIR  Procedures:  Inocencio Homes 11/24 bronchoscopy  Significant Diagnostic Tests:  CT chest 11/23 - R>L bilateral pleural effusions, stable nodules largest 2 in LUL, pulmonary edema, bullous emphysematous changes scattered throughout  Pleural fluid transudate 11/24  Micro Data:  Pleural fluid CX 11/24 - pending BAL CX 11/26 - Neg BAL AF smear - neg BAL AFB - sent BAL Fungal - no fungus BAL PJP - sent BAL Asp - no result Antimicrobials:  Ceftriaxone 11/27> Azithro 11/27>  Interim history/subjective:  Increasing NE overnight. Planning for impella placement today.   Objective   Blood pressure (!) 90/38, pulse (!) 113, temperature (!) 97.4 F  (36.3 C), temperature source Axillary, resp. rate (!) 28, height 6' (1.829 m), weight 60.1 kg, SpO2 (!) 89 %. CVP:  [4 mmHg-12 mmHg] 10 mmHg      Intake/Output Summary (Last 24 hours) at 05/03/2020 0837 Last data filed at 05/03/2020 0700 Gross per 24 hour  Intake 1299.49 ml  Output 200 ml  Net 1099.49 ml   Filed Weights   04/30/20 1000 05/01/20 2221 05/03/20 0500  Weight: 62.9 kg 59.4 kg 60.1 kg   Physical Exam: General: Chronically ill-appearing man lying in bed no acute distress, cachectic HEENT: temporal wasting, oral mucosa moist Respiratory: Tachypnea, no accessory muscle use.  No wheezing. Cardiovascular: Irregular rhythm, regular rate Extremities: Reduced muscle mass, no clubbing or cyanosis Neuro: Awake, alert, answering questions appropriately, globally weak Skin: No rashes or bruising  Resolved Hospital Problem list   n/a  Assessment & Plan:   Cardiogenic Shock- coox 30%, down from 60% last night Acute on chronic HFrEF Atrial fibrillation/flutter ICM (EF 25%), severe MR, severe TR, s/p CABG 10/2019 Has incompletely revascularized CAD -Impella 5.5 placement today; routine daily hemolysis labs -Intolerant to guideline directed heart failure therapy due to shock-no beta-blocker, ACE inhibitor, ARB, or Entresto -Con't amiodarone -Continue milrinone -Continue epi as required to maintain MAP greater than 65 -Diuresis limited due to impella alarms  AKI due to cardiorenal syndrome --Diuresis  Transudative pleural effusion secondary to heart failure BAL cultures remain negative to date- con't to follow cultures until finalized  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  No decompensated heart failure.  We will plan to remain  intubated overnight to facilitate diuresis were more successful vent weaning.  Continue low tidal volume ventilation, 4 to 8 cc/kg ideal body weight goal plateau's and 30 driving pressure less than 15. -Daily SAT and SBT   -Diuresis, strict I's/O  VTE in the past- unknown time frame Possible RLL PE this admission vs compressive atelectasis causing poor filling of pulmonary arteries on CT --heparin gtt  Hyponatermia due to heart failure -Continue to monitor -Diuresis  Hyperglycemia> falsely elevated likely due to dextrose containing drips.  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  Trial of Megace GI prophylaxis: Not indicated Urinary catheter: Condom catheter Central lines: PICC line Glucose control: Currently euglycemic on no therapy Mobility/therapy needs: PT consult Code Status: Full code Family Communication: Updated patient Disposition: ICU  Labs   CBC: Recent Labs  Lab 04/29/20 0336 04/30/20 0248 05/01/20 0528 05/02/20 0444 05/03/20 0349  WBC 3.1* 3.8* 3.8* 3.4* 5.2  HGB 11.4* 12.6* 11.6* 11.2* 11.6*  HCT 36.9* 43.2 39.7 37.6* 40.7  MCV 88.5 92.1 91.1 89.5 92.1  PLT 165 180 167 160 179    Basic Metabolic Panel: Recent Labs  Lab 04/27/20 0337 04/27/20 0337 04/28/20 1200 04/29/20 0851 05/01/20 0528 05/01/20 0528 05/01/20 2000 05/02/20 0444 05/02/20 1410 05/02/20 1856 05/03/20 0349  NA 140   < > 139   < > 143   < > 142 141 137 138 133*  K 3.0*   < > 4.0   < > 2.9*   < > 3.0* 3.1* 3.0* 3.3* 4.4  CL 102   < > 102   < > 100   < > 98 100 97* 99 93*  CO2 27   < > 25   < > 29   < > 32 _0 GLUCOSE 85   < >  130*   < > 100*   < > 129* 96 191* 183* 323*  BUN 17   < > 21   < > 15   < > _1 CREATININE 1.37*   < > 1.60*   < > 1.47*   < > 1.45* 1.54* 1.55* 1.59* 1.69*  CALCIUM 8.5*   < > 8.6*   < > 8.8*   < > 8.7* 8.8* 8.4* 8.3* 8.6*  MG 1.9  --  2.2  --  2.1  --   --   --  2.0  --   --   PHOS 4.1  --   --   --   --   --   --   --   --   --   --    < > = values in this interval not displayed.   GFR: Estimated Creatinine Clearance: 32.6 mL/min (A) (by C-G formula based on SCr of 1.69 mg/dL (H)). Recent Labs  Lab 04/30/20 0248 04/30/20 2038 05/01/20 0528 05/02/20 0444 05/03/20 0349  PROCALCITON 0.18  --  0.15  --   --   WBC 3.8*  --  3.8* 3.4* 5.2  LATICACIDVEN  --  1.8  --   --   --     Liver Function Tests: Recent Labs  Lab 04/27/20 0337 04/27/20 1125  AST 21  --   ALT 19  --   ALKPHOS 48  --   BILITOT 1.1  --   PROT 6.1*  --   ALBUMIN 2.7* 3.0*   No results for input(s): LIPASE, AMYLASE in the last 168 hours. No results for input(s): AMMONIA in the last 168 hours.  ABG    Component Value Date/Time   PHART 7.371 10/18/2019 0224   PCO2ART 35.4 10/18/2019 0224   PO2ART 113 (H) 10/18/2019 0224   HCO3 20.5 10/18/2019 0224   TCO2 23 11/12/2019 1730   ACIDBASEDEF 4.0 (H) 10/18/2019 0224   O2SAT 30.3 05/03/2020 0315     Coagulation Profile: Recent Labs  Lab 04/27/20 0337  INR 2.1*    Cardiac Enzymes: No results for input(s): CKTOTAL, CKMB, CKMBINDEX, TROPONINI in the last 168 hours.  HbA1C: Hgb A1c MFr Bld  Date/Time Value Ref Range Status  10/17/2019 12:05 PM 5.7 (H) 4.8 - 5.6 % Final    Comment:    (NOTE) Pre diabetes:          5.7%-6.4% Diabetes:              >6.4% Glycemic control for   <7.0% adults with diabetes      This patient is critically ill with multiple organ system failure which requires frequent high complexity decision making, assessment, support, evaluation, and titration of therapies. This was completed through the application of  advanced monitoring technologies and extensive interpretation of multiple databases. During this encounter critical care time was devoted to patient care services described in this note for 51 minutes.  Julian Hy, DO 05/03/20 8:40 AM [facility]

## 2020-05-03 NOTE — Brief Op Note (Signed)
04/26/2020 - 05/03/2020  11:48 AM  PATIENT:  Cody Arellano  74 y.o. male  PRE-OPERATIVE DIAGNOSIS:  CARDIOGENIC SHOCK  POST-OPERATIVE DIAGNOSIS:  CARDIOGENIC SHOCK  PROCEDURE:  Procedure(s): PLACEMENT OF IMPELLA 5.5 LEFT VENTRICULAR ASSIST DEVICE VIA RIGHT AXILLARY (Right) TRANSESOPHAGEAL ECHOCARDIOGRAM (TEE) (N/A)  SURGEON:  Surgeon(s) and Role:    * [name], MD - Primary  PHYSICIAN ASSISTANT:   ASSISTANTS: staff   ANESTHESIA:   general  EBL:  100 mL   BLOOD ADMINISTERED:none  DRAINS: none   LOCAL MEDICATIONS USED:  NONE  SPECIMEN:  No Specimen  DISPOSITION OF SPECIMEN:  N/A  COUNTS:  YES  TOURNIQUET:  * No tourniquets in log *  DICTATION: .Note written in EPIC  PLAN OF CARE: Admit to inpatient   PATIENT DISPOSITION:  ICU - intubated and critically ill.   Delay start of Pharmacological VTE agent (>24hrs) due to surgical blood loss or risk of bleeding: yes

## 2020-05-03 NOTE — Progress Notes (Signed)
Spouse at bedside speaking with patient. Anesthesia at bedside and transported patient to OR for Impella.

## 2020-05-03 NOTE — Anesthesia Procedure Notes (Signed)
Central Venous Catheter Insertion Performed by: Myrtie Soman, MD, anesthesiologist Start/End11/30/2021 11:40 AM, 05/03/2020 11:55 AM Patient location: Pre-op. Preanesthetic checklist: patient identified, IV checked, site marked, risks and benefits discussed, surgical consent, monitors and equipment checked, pre-op evaluation, timeout performed and anesthesia consent Position: Trendelenburg Lidocaine 1% used for infiltration and patient sedated Hand hygiene performed  and maximum sterile barriers used  Catheter size: 8.5 Fr PA cath was placed.Sheath introducer PA Cath depth:45 Procedure performed using ultrasound guided technique. Ultrasound Notes:anatomy identified, needle tip was noted to be adjacent to the nerve/plexus identified, no ultrasound evidence of intravascular and/or intraneural injection and image(s) printed for medical record Attempts: 1 Following insertion, line sutured, dressing applied and Biopatch. Post procedure assessment: blood return through all ports, free fluid flow and no air  Patient tolerated the procedure well with no immediate complications.

## 2020-05-03 NOTE — Progress Notes (Signed)
Patient ID: Cody Arellano, male   DOB: 06/10/1945, 74 y.o.   MRN: [id number]  Suction alarm this evening.  Position of Impella assessed under echo, looks stable/in good position at 4.6 cm.  RV is moderately dilated with normal to borderline decreased function, LV is decompressed.  I measured a CVP of 5.  Cardiac index remains low off Impella despite good flow 4-4.1 L/min at P8.  No further suction alarms. SVR elevated.  - Increase milrinone to 0.375 - Will give 250 cc bolus, no Lasix for now.  - Heparin currently running in purge, will check level and start systemic heparin gtt if appropriate.  - Patient is awake on vent, watch overnight and hopefully extubate in am.   Cody Arellano 05/03/2020 5:17 PM

## 2020-05-03 NOTE — Progress Notes (Signed)
Inpatient Diabetes Program Recommendations  AACE/ADA: New Consensus Statement on Inpatient Glycemic Control (2015)  Target Ranges:  Prepandial:   less than 140 mg/dL      Peak postprandial:   less than 180 mg/dL (1-2 hours)      Critically ill patients:  140 - 180 mg/dL   Results for Cody Arellano, Cody Arellano (MRN [id number]) as of 05/03/2020 06:52  Ref. Range 05/01/2020 20:00 05/02/2020 04:44 05/02/2020 14:10 05/02/2020 18:56 05/03/2020 03:49  Glucose Latest Ref Range: 70 - 99 mg/dL 129 (H) 96 191 (H) 183 (H) 323 (H)    No History of Diabetes noted   MD- Note Lab glucose elevated to 323 mg/dl this AM  May consider adding order for CBG checks TID ac + hs  If CBGs are elevated, could add Novolog 0-9 units for SSI coverage     --Will follow patient during hospitalization--  Wyn Quaker RN, MSN, CDE Diabetes Coordinator Inpatient Glycemic Control Team Team Pager: [phone] (8a-5p)

## 2020-05-03 NOTE — Anesthesia Procedure Notes (Signed)
Arterial Line Insertion Start/End11/30/2021 10:05 AM, 05/03/2020 10:16 AM Performed by: Wilburn Cornelia, CRNA, CRNA  Preanesthetic checklist: patient identified, IV checked, site marked, risks and benefits discussed, surgical consent, monitors and equipment checked, pre-op evaluation, timeout performed and anesthesia consent Lidocaine 1% used for infiltration Left, radial was placed Catheter size: 20 G Hand hygiene performed  and maximum sterile barriers used   Attempts: 2 Procedure performed using ultrasound guided technique. Ultrasound Notes:anatomy identified, needle tip was noted to be adjacent to the nerve/plexus identified and no ultrasound evidence of intravascular and/or intraneural injection Following insertion, Biopatch and dressing applied. Post procedure assessment: normal  Patient tolerated the procedure well with no immediate complications.

## 2020-05-03 NOTE — Progress Notes (Signed)
ANTICOAGULATION CONSULT NOTE  Pharmacy Consult for heparin Indication: Impella and hx PE  No Known Allergies  Patient Measurements: Height: 6' (182.9 cm) Weight: 60.1 kg (132 lb 7.9 oz) IBW/kg (Calculated) : 77.6 Heparin Dosing Weight: 60kg  Vital Signs: Temp: 97 F (36.1 C) (11/30 1900) BP: 87/56 (11/30 0900) Pulse Rate: 97 (11/30 1929)  Labs: Recent Labs    05/01/20 0528 05/01/20 2000 05/02/20 0444 05/02/20 0444 05/02/20 1410 05/02/20 1856 05/03/20 0349 05/03/20 0349 05/03/20 1043 05/03/20 1334 05/03/20 1714  HGB 11.6*  --  11.2*   < >  --   --  11.6*   < > 15.0 13.6  --   HCT 39.7  --  37.6*   < >  --   --  40.7  --  44.0 40.0  --   PLT 167  --  160  --   --   --  190  --   --   --   --   APTT  --   --   --   --   --   --   --   --   --   --  81*  HEPARINUNFRC  --   --   --   --   --   --   --   --   --   --  >2.20*  CREATININE 1.47*   < > 1.54*  --    < > 1.59* 1.69*  --   --   --  1.80*   < > = values in this interval not displayed.    Estimated Creatinine Clearance: 30.6 mL/min (A) (by C-G formula based on SCr of 1.8 mg/dL (H)).  Assessment: 56 yoM with hx recent PE (03/2020) on apixaban PTA admitted with ADHF now s/p Impella placement. Heparin infusing via the purge solution only. Discussed with CVTS and HF, will begin systemic heparin as needed tonight pending evening coags. Will likely need to utilize aPTTs for now, and will target low-end therapeutic goals with recent PE.  Hep lvl high > 2.2 - so will utilize aptt for now for monitoring aptt within goal 81  Purge running at 13.9 mL/hr  Goal of Therapy:  Heparin level 0.3-0.5 units/ml aPTT 66-85 seconds Monitor platelets by anticoagulation protocol: Yes   Plan:  Impella via purge solution (50 units/ml) Recheck aptt at 0200 Will add systemic heparin when aptt closer to lower end of goal  Barth Kirks, PharmD, BCPS, BCCCP Clinical Pharmacist [phone]  Please check AMION for all [place]  numbers  05/03/2020 8:04 PM

## 2020-05-03 NOTE — Progress Notes (Signed)
  Echocardiogram 2D Echocardiogram has been performed.  Johny Chess 05/03/2020, 5:23 PM

## 2020-05-03 NOTE — Progress Notes (Signed)
Brief Palliative Care Progress Note:  Received page at 8:30am that patient needed [place] conversation before going to surgery this morning. Spoke with primary RN - patient scheduled for procedure at 10:00am. Chart reviewed.  Arrived to patient's room at 9:30am - patient was being taken to procedure - unfortunately, was not able to meet with patient prior to surgery. PMT can follow up with patient post-op.  Thank you for allowing PMT to assist in the care of this patient.  Marillyn Goren M. Tamala Julian [facility] Palliative Medicine Team Team Phone: [phone] NO CHARGE

## 2020-05-03 NOTE — Progress Notes (Signed)
ANTICOAGULATION CONSULT NOTE  Pharmacy Consult for heparin Indication: Impella and hx PE  No Known Allergies  Patient Measurements: Height: 6' (182.9 cm) Weight: 60.1 kg (132 lb 7.9 oz) IBW/kg (Calculated) : 77.6 Heparin Dosing Weight: 60kg  Vital Signs: Temp: 97.4 F (36.3 C) (11/30 0357) Temp Source: Axillary (11/30 0357) BP: 87/56 (11/30 0900) Pulse Rate: 101 (11/30 0900)  Labs: Recent Labs    05/01/20 0528 05/01/20 2000 05/02/20 0444 05/02/20 0444 05/02/20 1410 05/02/20 1856 05/03/20 0349 05/03/20 1043  HGB 11.6*  --  11.2*   < >  --   --  11.6* 15.0  HCT 39.7  --  37.6*  --   --   --  40.7 44.0  PLT 167  --  160  --   --   --  190  --   CREATININE 1.47*   < > 1.54*   < > 1.55* 1.59* 1.69*  --    < > = values in this interval not displayed.    Estimated Creatinine Clearance: 32.6 mL/min (A) (by C-G formula based on SCr of 1.69 mg/dL (H)).   Medical History: Past Medical History:  Diagnosis Date  . Anxiety   . CHF (congestive heart failure) ([place])   . Chronic tension headache   . Colon polyps   . Coronary artery disease 2008/2009   MI with PCI x 2, then PCI x 1 in 2009  . Depression   . Dyslipidemia   . Dysrhythmia    atrial fibrillation  . Hyperlipidemia   . Hypertension   . Lupus [facility])    sees Dr Trudie Reed  . Lupus disease of the lung   . 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 Myocardial infarction ([place]) 2008  . 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 Shortness of breath     Assessment: 42 yoM with hx recent PE (03/2020) on apixaban PTA admitted with ADHF now s/p Impella placement. Heparin infusing via the purge solution only. Discussed with CVTS and HF, will begin systemic heparin as needed tonight pending evening coags. Will likely need to utilize aPTTs for now, and will target low-end therapeutic goals with recent PE.  Goal of Therapy:  Heparin level 0.3-0.5 units/ml aPTT 66-85 seconds Monitor  platelets by anticoagulation protocol: Yes   Plan:  Impella via purge solution (50 units/ml) Check aPTT and heparin level 6h after start of purge Add systemic heparin as needed   Arrie Senate, PharmD, BCPS, [facility] Clinical Pharmacist [phone] Please check AMION for all [place] numbers 05/03/2020

## 2020-05-03 NOTE — Anesthesia Preprocedure Evaluation (Signed)
Anesthesia Evaluation  Patient identified by MRN, date of birth, ID band Patient awake    Reviewed: Allergy & Precautions, NPO status , Patient's Chart, lab work & pertinent test results  Airway Mallampati: II  TM Distance: >3 FB Neck ROM: Limited    Dental no notable dental hx.    Pulmonary neg pulmonary ROS, former smoker,    Pulmonary exam normal + rhonchi        Cardiovascular hypertension, + CAD, + Past MI and +CHF  + dysrhythmias  Rhythm:Regular Rate:Normal + Systolic murmurs Cardiogenic shock with decreasing EF 20-25%   Neuro/Psych negative neurological ROS  negative psych ROS   GI/Hepatic negative GI ROS, Neg liver ROS,   Endo/Other  negative endocrine ROS  Renal/GU Renal InsufficiencyRenal disease  negative genitourinary   Musculoskeletal negative musculoskeletal ROS (+)   Abdominal   Peds negative pediatric ROS (+)  Hematology negative hematology ROS (+) anemia ,   Anesthesia Other Findings   Reproductive/Obstetrics negative OB ROS                             Anesthesia Physical Anesthesia Plan  ASA: IV and emergent  Anesthesia Plan: General   Post-op Pain Management:    Induction: Intravenous  PONV Risk Score and Plan: 0  Airway Management Planned: Oral ETT  Additional Equipment: Arterial line, PA Cath, TEE, Ultrasound Guidance Line Placement and CVP  Intra-op Plan:   Post-operative Plan: Post-operative intubation/ventilation  Informed Consent: I have reviewed the patients History and Physical, chart, labs and discussed the procedure including the risks, benefits and alternatives for the proposed anesthesia with the patient or authorized representative who has indicated his/her understanding and acceptance.     Dental advisory given  Plan Discussed with: CRNA and Surgeon  Anesthesia Plan Comments:         Anesthesia Quick Evaluation

## 2020-05-03 NOTE — Anesthesia Procedure Notes (Signed)
Anesthesia Procedure Image    

## 2020-05-03 NOTE — Transfer of Care (Signed)
Immediate Anesthesia Transfer of Care Note  Patient: NAHEIM BURGEN  Procedure(s) Performed: PLACEMENT OF IMPELLA 5.5 LEFT VENTRICULAR ASSIST DEVICE VIA RIGHT AXILLARY (Right Chest) TRANSESOPHAGEAL ECHOCARDIOGRAM (TEE) (N/A )  Patient Location: SICU  Anesthesia Type:General  Level of Consciousness: Patient remains intubated per anesthesia plan  Airway & Oxygen Therapy: Patient remains intubated per anesthesia plan and Patient placed on Ventilator (see vital sign flow sheet for setting)  Post-op Assessment: Report given to RN and Post -op Vital signs reviewed and stable  Post vital signs: Reviewed and stable  Last Vitals:  Vitals Value Taken Time  BP    Temp    Pulse    Resp    SpO2 99 % 05/03/20 1232    Last Pain:  Vitals:   05/03/20 0700  TempSrc:   PainSc: 0-No pain      Patients Stated Pain Goal: 0 (78/71/83 6725)  Complications: No complications documented.

## 2020-05-03 NOTE — Consult Note (Signed)
[address]       [place] [address]             [phone]        Joanne Q Tomer [place] Medical Record #[id number] Date of Birth: [birth year]  Referring: No ref. provider found Primary Care: Venia Carbon, MD Primary Cardiologist:Philip Nahser, MD  Chief Complaint:    Chief Complaint  Patient presents with  . Shortness of Breath    History of Present Illness:      74 yo man with h/o CABG in 5/21 for ICM (prior EF 45%) and SLE presented with increased HF sx. He had bilateral pleural effusions and echocardiographic evidence of significantly reduced LV function by EF (20%), severe MR, and atrial fib. He has not responded well to inotropes and his co-ox sat remains at 30%. We are consulted for Impella implant for medical stabilization.   Current Activity/ Functional Status: Patient is not independent with mobility/ambulation, transfers, ADL's, IADL's.   Zubrod Score: At the time of surgery this patient's most appropriate activity status/level should be described as: []     0    Normal activity, no symptoms []     1    Restricted in physical strenuous activity but ambulatory, able to do out light work []     2    Ambulatory and capable of self care, unable to do work activities, up and about                 more than 50%  Of the time                            []     3    Only limited self care, in bed greater than 50% of waking hours []     4    Completely disabled, no self care, confined to bed or chair []     5    Moribund  Past Medical History:  Diagnosis Date  . Anxiety   . CHF (congestive heart failure) ([place])   . Chronic tension headache   . Colon polyps   . Coronary artery disease 2008/2009   MI with PCI x 2, then PCI x 1 in 2009  . Depression   . Dyslipidemia   . Dysrhythmia    atrial fibrillation  . Hyperlipidemia   . Hypertension   . Lupus [facility])    sees Dr Trudie Reed  . Lupus disease of the lung   . 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  Myocardial infarction ([place]) 2008  . 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 Shortness of breath     Past Surgical History:  Procedure Laterality Date  . BRONCHIAL WASHINGS  04/29/2020   Procedure: BRONCHIAL WASHINGS;  Surgeon: Garner Nash, DO;  Location: [facility];  Service: Pulmonary;;  . CARDIAC CATHETERIZATION    . CLIPPING OF ATRIAL APPENDAGE N/A 10/17/2019   Procedure: Clipping Of Atrial Appendage using AtriCure [id number] 45 MM AtriClip.;  Surgeon: Rexene Alberts, MD;  Location: [place];  Service: Open Heart Surgery;  Laterality: N/A;  . CORONARY ARTERY BYPASS GRAFT N/A 10/17/2019   Procedure: CORONARY ARTERY BYPASS GRAFTING (CABG) using LIMA to LAD; Endoscopic harvest right greater saphenous vein: SVG to RAMUS.;  Surgeon: Rexene Alberts, MD;  Location: [place];  Service: Open Heart Surgery;  Laterality:  N/A;  . CORONARY BALLOON ANGIOPLASTY N/A 10/17/2019   Procedure: CORONARY BALLOON ANGIOPLASTY;  Surgeon: Nelva Bush, MD;  Location: [place] [facility];  Service: Cardiovascular;  Laterality: N/A;  . coronary stents  2009  . CORONARY/GRAFT ACUTE MI REVASCULARIZATION N/A 10/17/2019   Procedure: Coronary/Graft Acute MI Revascularization;  Surgeon: Nelva Bush, MD;  Location: [place] [facility];  Service: Cardiovascular;  Laterality: N/A;  . ENDOVEIN HARVEST OF GREATER SAPHENOUS VEIN Right 10/17/2019   Procedure: [place] Of Greater Saphenous Vein;  Surgeon: Rexene Alberts, MD;  Location: [place];  Service: Open Heart Surgery;  Laterality: Right;  . IABP INSERTION N/A 10/17/2019   Procedure: IABP Insertion;  Surgeon: Nelva Bush, MD;  Location: [place] [facility];  Service: Cardiovascular;  Laterality: N/A;  . INCISION AND DRAINAGE ABSCESS N/A 01/29/2020   Procedure: INCISION AND DRAINAGE BILATERAL PERIRECTAL ABSCESS;  Surgeon: Stark Klein, MD;  Location: [place];  Service: General;  Laterality: N/A;  . IR  THORACENTESIS ASP PLEURAL SPACE W/IMG GUIDE  10/26/2019  . IR THORACENTESIS ASP PLEURAL SPACE W/IMG GUIDE  04/27/2020  . RIGHT/LEFT HEART CATH AND CORONARY ANGIOGRAPHY N/A 10/17/2019   Procedure: RIGHT/LEFT HEART CATH AND CORONARY ANGIOGRAPHY;  Surgeon: Nelva Bush, MD;  Location: [place] [facility];  Service: Cardiovascular;  Laterality: N/A;  . TEE WITHOUT CARDIOVERSION  10/17/2019   Procedure: Transesophageal Echocardiogram (Tee);  Surgeon: Rexene Alberts, MD;  Location: [facility];  Service: Open Heart Surgery;;  . VIDEO BRONCHOSCOPY N/A 04/29/2020   Procedure: VIDEO BRONCHOSCOPY WITHOUT FLUORO;  Surgeon: Garner Nash, DO;  Location: [place];  Service: Pulmonary;  Laterality: N/A;    Social History   Tobacco Use  Smoking Status Former Smoker  . Packs/day: 1.00  . Years: 20.00  . Pack years: 20.00  . Types: Cigarettes  . Quit date: 02/26/1993  . Years since quitting: 27.2  Smokeless Tobacco Never Used  Tobacco Comment   QUIT SMOKING 20 YEARS AGO    Social History   Substance and Sexual Activity  Alcohol Use No     No Known Allergies  Current Facility-Administered Medications  Medication Dose Route Frequency Provider Last Rate Last Admin  . 0.9 %  sodium chloride infusion  250 mL Intravenous Continuous Carlene Coria, NP   Stopped at 05/01/20 1559  . 0.9 %  sodium chloride infusion   Intra-arterial PRN Larey Dresser, MD      . amiodarone (NEXTERONE PREMIX) 360-4.14 MG/200ML-% (1.8 mg/mL) IV infusion  60 mg/hr Intravenous Continuous Carlene Coria, NP 33.3 mL/hr at 05/03/20 0700 60 mg/hr at 05/03/20 0700  . aspirin EC tablet 81 mg  81 mg Oral Daily Carlene Coria, NP   81 mg at 05/02/20 1035  . azaTHIOprine (IMURAN) tablet 100 mg  100 mg Oral BID Carlene Coria, NP   100 mg at 05/02/20 1034  . bisacodyl (DULCOLAX) suppository 10 mg  10 mg Rectal Daily PRN Carlene Coria, NP      . Chlorhexidine Gluconate Cloth 2 % PADS 6 each  6 each Topical Daily  Carlene Coria, NP   6 each at 05/02/20 1049  . docusate sodium (COLACE) capsule 100 mg  100 mg Oral BID Carlene Coria, NP   100 mg at 05/02/20 1035  . feeding supplement (ENSURE ENLIVE / ENSURE PLUS) liquid 237 mL  237 mL Oral TID BM Carlene Coria, NP   237 mL at 05/02/20 1631  . furosemide (LASIX) injection  60 mg  60 mg Intravenous BID Larey Dresser, MD   60 mg at 05/03/20 4580  . heparin ADULT infusion 100 units/mL (25000 units/236mL sodium chloride 0.45%)  800 Units/hr Intravenous Continuous Pierce, Dwayne A, RPH 8 mL/hr at 05/03/20 0700 800 Units/hr at 05/03/20 0700  . heparin Impella PURGE (50,000 units/1000 mL) 50 units/mL   Intracatheter Continuous Larey Dresser, MD      . hydroxychloroquine (PLAQUENIL) tablet 400 mg  400 mg Oral QHS Carlene Coria, NP   400 mg at 05/02/20 2300  . lactated ringers infusion   Intravenous Continuous Carlene Coria, NP   Stopped at 05/01/20 1600  . lidocaine (XYLOCAINE) 1 % (with pres) injection    PRN Ronney Lion, PA-C   10 mL at 04/27/20 0918  . megestrol (MEGACE) tablet 40 mg  40 mg Oral Daily Carlene Coria, NP   40 mg at 05/02/20 1035  . milrinone (PRIMACOR) 20 MG/100 ML (0.2 mg/mL) infusion  0.25 mcg/kg/min Intravenous Continuous Larey Dresser, MD 4.46 mL/hr at 05/03/20 0801 0.25 mcg/kg/min at 05/03/20 0801  . multivitamin with minerals tablet 1 tablet  1 tablet Oral Daily Carlene Coria, NP      . norepinephrine (LEVOPHED) 4mg  in 251mL premix infusion  0-40 mcg/min Intravenous Titrated Carlene Coria, NP 30 mL/hr at 05/03/20 0700 8 mcg/min at 05/03/20 0700  . ondansetron (ZOFRAN) injection 4 mg  4 mg Intravenous Q6H PRN Carlene Coria, NP   4 mg at 05/03/20 0351  . oxyCODONE (Oxy IR/ROXICODONE) immediate release tablet 5 mg  5 mg Oral Q6H PRN Carlene Coria, NP   5 mg at 04/27/20 1329  . polyethylene glycol (MIRALAX / GLYCOLAX) packet 17 g  17 g Oral Daily PRN Carlene Coria, NP   17 g at 04/30/20 1238  .  rosuvastatin (CRESTOR) tablet 20 mg  20 mg Oral Daily Carlene Coria, NP   20 mg at 05/02/20 1037  . sodium chloride flush (NS) 0.9 % injection 10-40 mL  10-40 mL Intracatheter Q12H Carlene Coria, NP   10 mL at 05/02/20 2300  . sodium chloride flush (NS) 0.9 % injection 10-40 mL  10-40 mL Intracatheter PRN Carlene Coria, NP      . tamsulosin (FLOMAX) capsule 0.4 mg  0.4 mg Oral QPM Carlene Coria, NP   0.4 mg at 05/01/20 1807    Medications Prior to Admission  Medication Sig Dispense Refill Last Dose  . apixaban (ELIQUIS) 5 MG TABS tablet Take 1 tablet (5 mg total) by mouth 2 (two) times daily. 60 tablet 0 04/26/2020 at Unknown time  . aspirin EC 81 MG tablet Take 1 tablet (81 mg total) by mouth daily.   04/26/2020 at Unknown time  . azaTHIOprine (IMURAN) 50 MG tablet Take 100 mg by mouth 2 (two) times daily.   2 04/26/2020 at Unknown time  . furosemide (LASIX) 40 MG tablet Please take 40 mg oral daily, and take extra 40 mg of Lasix if you gain 3 pounds have worsening lower extremity edema for 1 day. 30 tablet  04/26/2020 at Unknown time  . hydroxychloroquine (PLAQUENIL) 200 MG tablet Take 400 mg by mouth at bedtime.    04/25/2020 at Unknown time  . oxyCODONE (OXY IR/ROXICODONE) 5 MG immediate release tablet Take 1 tablet (5 mg total) by mouth every 6 (six) hours as needed for severe pain. 30 tablet 0 unk  . polyethylene glycol (MIRALAX / GLYCOLAX) 17  g packet Take 17 g by mouth daily as needed for moderate constipation.    04/26/2020 at Unknown time  . polyvinyl alcohol (ARTIFICIAL TEARS) 1.4 % ophthalmic solution Place 2 drops into both eyes daily.    Past Week at Unknown time  . rosuvastatin (CRESTOR) 20 MG tablet Take 20 mg by mouth daily.   04/26/2020 at Unknown time  . tamsulosin (FLOMAX) 0.4 MG CAPS capsule Take 0.4 mg by mouth every evening.    04/25/2020 at Unknown time    Family History  Problem Relation Age of Onset  . Heart disease Mother   . Alcohol abuse Father   .  Hyperlipidemia Sister   . Hypertension Sister   . Hyperlipidemia Brother   . Hypertension Brother   . Cancer Brother        ?lung cancer  . Stomach cancer Brother   . Diabetes Paternal Uncle   . Colon cancer Neg Hx   . Esophageal cancer Neg Hx   . Rectal cancer Neg Hx      Review of Systems:   ROS Pertinent items are noted in HPI.     Cardiac Review of Systems: Y or  [    ]= no  Chest Pain [    ]  Resting SOB [   ] Exertional SOB  [  ]  Orthopnea [  ]   Pedal Edema [   ]    Palpitations [  ] Syncope  [  ]   Presyncope [   ]  General Review of Systems: [Y] = yes [  ]=no Constitional: recent weight change [  ]; anorexia [  ]; fatigue [  ]; nausea [  ]; night sweats [  ]; fever [  ]; or chills [  ]                                                               Dental: Last Dentist visit:   Eye : blurred vision [  ]; diplopia [   ]; vision changes [  ];  Amaurosis fugax[  ]; Resp: cough [  ];  wheezing[  ];  hemoptysis[  ]; shortness of breath[  ]; paroxysmal nocturnal dyspnea[  ]; dyspnea on exertion[  ]; or orthopnea[  ];  GI:  gallstones[  ], vomiting[  ];  dysphagia[  ]; melena[  ];  hematochezia [  ]; heartburn[  ];   Hx of  Colonoscopy[  ]; GU: kidney stones [  ]; hematuria[  ];   dysuria [  ];  nocturia[  ];  history of     obstruction [  ]; urinary frequency [  ]             Skin: rash, swelling[  ];, hair loss[  ];  peripheral edema[  ];  or itching[  ]; Musculosketetal: myalgias[  ];  joint swelling[  ];  joint erythema[  ];  joint pain[  ];  back pain[  ];  Heme/Lymph: bruising[  ];  bleeding[  ];  anemia[  ];  Neuro: TIA[  ];  headaches[  ];  stroke[  ];  vertigo[  ];  seizures[  ];   paresthesias[  ];  difficulty walking[  ];  Psych:depression[  ]; anxiety[  ];  Endocrine: diabetes[  ];  thyroid dysfunction[  ];            Physical Exam: BP (!) 90/38   Pulse (!) 113   Temp (!) 97.4 F (36.3 C) (Axillary)   Resp (!) 28   Ht 6' (1.829 m)   Wt 60.1 kg   SpO2 (!) 89%    BMI 17.97 kg/m    General appearance: alert, cooperative and no distress Head: Normocephalic, without obvious abnormality, atraumatic Neck: JVD - 3 cm above sternal notch, no adenopathy, no carotid bruit, supple, symmetrical, trachea midline and thyroid not enlarged, symmetric, no tenderness/mass/nodules Resp: diminished breath sounds bibasilar Cardio: irregularly irregular rhythm GI: soft, non-tender; bowel sounds normal; no masses,  no organomegaly Extremities: extremities normal, atraumatic, no cyanosis or edema Neurologic: Grossly normal  Diagnostic Studies & Laboratory data:     Recent Radiology Findings:   No results found.   I have independently reviewed the above radiologic studies and discussed with the patient   Recent Lab Findings: Lab Results  Component Value Date   WBC 5.2 05/03/2020   HGB 11.6 (L) 05/03/2020   HCT 40.7 05/03/2020   PLT 190 05/03/2020   GLUCOSE 323 (H) 05/03/2020   CHOL 113 10/17/2019   TRIG 48 10/17/2019   HDL 45 10/17/2019   LDLCALC 58 10/17/2019   ALT 19 04/27/2020   AST 21 04/27/2020   NA 133 (L) 05/03/2020   K 4.4 05/03/2020   CL 93 (L) 05/03/2020   CREATININE 1.69 (H) 05/03/2020   BUN 17 05/03/2020   CO2 24 05/03/2020   TSH 4.438 05/03/2020   INR 2.1 (H) 04/27/2020   HGBA1C 5.7 (H) 10/17/2019      Assessment / Plan:      I have discussed the case with Dr. Aundra Dubin and with the patient. I agree that stabilization with mechanical support will prevent further end-organ failure and will allow a more complete work-up including LHC. He may not be a durable LVAD candidate, but there are items in the medical condition which could be addressed once stabilized: atrial fib, MR, for example. Will plan to proceed with Impella insertion this am.      I  spent 20 minutes counseling the patient face to face.   Jennefer Kopp Z. Orvan Seen, MD [phone] 05/03/2020 8:59 AM

## 2020-05-03 NOTE — H&P (Signed)
History and Physical Interval Note:  05/03/2020 9:06 AM  [name]  has presented today for surgery, with the diagnosis of CARDIOGENIC SHOCK.  The various methods of treatment have been discussed with the patient and family. After consideration of risks, benefits and other options for treatment, the patient has consented to  Procedure(s): PLACEMENT OF IMPELLA 5.5 LEFT VENTRICULAR ASSIST DEVICE VIA RIGHT AXILLARY (Right) TRANSESOPHAGEAL ECHOCARDIOGRAM (TEE) (N/A) as a surgical intervention.  The patient's history has been reviewed, patient examined, no change in status, stable for surgery.  I have reviewed the patient's chart and labs.  Questions were answered to the patient's satisfaction.     Cody Arellano

## 2020-05-04 ENCOUNTER — Inpatient Hospital Stay (HOSPITAL_COMMUNITY): Payer: No Typology Code available for payment source

## 2020-05-04 ENCOUNTER — Inpatient Hospital Stay (HOSPITAL_COMMUNITY)
Admission: EM | Disposition: A | Discharge status: Rehabilitation Facility | Payer: Self-pay | Source: Home / Self Care | Attending: Cardiology

## 2020-05-04 ENCOUNTER — Ambulatory Visit (HOSPITAL_COMMUNITY): Admit: 2020-05-04 | Payer: No Typology Code available for payment source | Admitting: Cardiology

## 2020-05-04 ENCOUNTER — Encounter (HOSPITAL_COMMUNITY): Payer: Self-pay

## 2020-05-04 ENCOUNTER — Encounter (HOSPITAL_COMMUNITY): Payer: Self-pay | Admitting: Cardiothoracic Surgery

## 2020-05-04 DIAGNOSIS — I5023 Acute on chronic systolic (congestive) heart failure: Secondary | ICD-10-CM

## 2020-05-04 DIAGNOSIS — Z9911 Dependence on respirator [ventilator] status: Secondary | ICD-10-CM | POA: Diagnosis not present

## 2020-05-04 DIAGNOSIS — I2510 Atherosclerotic heart disease of native coronary artery without angina pectoris: Secondary | ICD-10-CM

## 2020-05-04 DIAGNOSIS — J849 Interstitial pulmonary disease, unspecified: Secondary | ICD-10-CM

## 2020-05-04 DIAGNOSIS — J90 Pleural effusion, not elsewhere classified: Secondary | ICD-10-CM | POA: Diagnosis not present

## 2020-05-04 DIAGNOSIS — R570 Cardiogenic shock: Secondary | ICD-10-CM

## 2020-05-04 DIAGNOSIS — J9601 Acute respiratory failure with hypoxia: Secondary | ICD-10-CM | POA: Diagnosis not present

## 2020-05-04 HISTORY — PX: CORONARY/GRAFT ANGIOGRAPHY: CATH118237

## 2020-05-04 HISTORY — PX: LEFT HEART CATH AND CORS/GRAFTS ANGIOGRAPHY: CATH118250

## 2020-05-04 LAB — ANAEROBIC CULTURE

## 2020-05-04 LAB — COMPREHENSIVE METABOLIC PANEL
ALT: 33 U/L (ref 0–44)
ALT: 28 U/L (ref 0–44)
AST: 57 U/L — ABNORMAL HIGH (ref 15–41)
AST: 41 U/L (ref 15–41)
Albumin: 2.5 g/dL — ABNORMAL LOW (ref 3.5–5.0)
Albumin: 2.4 g/dL — ABNORMAL LOW (ref 3.5–5.0)
Alkaline Phosphatase: 56 U/L (ref 38–126)
Alkaline Phosphatase: 50 U/L (ref 38–126)
Anion gap: 13 (ref 5–15)
Anion gap: 12 (ref 5–15)
BUN: 16 mg/dL (ref 8–23)
BUN: 15 mg/dL (ref 8–23)
CO2: 23 mmol/L (ref 22–32)
CO2: 22 mmol/L (ref 22–32)
Calcium: 8.4 mg/dL — ABNORMAL LOW (ref 8.9–10.3)
Calcium: 8 mg/dL — ABNORMAL LOW (ref 8.9–10.3)
Chloride: 99 mmol/L (ref 98–111)
Chloride: 99 mmol/L (ref 98–111)
Creatinine, Ser: 1.55 mg/dL — ABNORMAL HIGH (ref 0.61–1.24)
Creatinine, Ser: 1.33 mg/dL — ABNORMAL HIGH (ref 0.61–1.24)
GFR, Estimated: 47 mL/min — ABNORMAL LOW (ref >60)
GFR, Estimated: 56 mL/min — ABNORMAL LOW (ref >60)
Glucose, Bld: 88 mg/dL (ref 70–99)
Glucose, Bld: 91 mg/dL (ref 70–99)
Potassium: 4.1 mmol/L (ref 3.5–5.1)
Potassium: 3.8 mmol/L (ref 3.5–5.1)
Sodium: 135 mmol/L (ref 135–145)
Sodium: 133 mmol/L — ABNORMAL LOW (ref 135–145)
Total Bilirubin: 1.6 mg/dL — ABNORMAL HIGH (ref 0.3–1.2)
Total Bilirubin: 1.5 mg/dL — ABNORMAL HIGH (ref 0.3–1.2)
Total Protein: 5.9 g/dL — ABNORMAL LOW (ref 6.5–8.1)
Total Protein: 5.4 g/dL — ABNORMAL LOW (ref 6.5–8.1)

## 2020-05-04 LAB — POCT ACTIVATED CLOTTING TIME: Activated Clotting Time: 172 seconds

## 2020-05-04 LAB — ECHOCARDIOGRAM LIMITED
Height: 72 in
Weight: 2215.18 oz

## 2020-05-04 LAB — COOXEMETRY PANEL
Carboxyhemoglobin: 1.3 % (ref 0.5–1.5)
Carboxyhemoglobin: 1.1 % (ref 0.5–1.5)
Methemoglobin: 0.7 % (ref 0.0–1.5)
Methemoglobin: 0.8 % (ref 0.0–1.5)
O2 Saturation: 64.4 %
O2 Saturation: 64.8 %
Total hemoglobin: 10.8 g/dL — ABNORMAL LOW (ref 12.0–16.0)
Total hemoglobin: 10.3 g/dL — ABNORMAL LOW (ref 12.0–16.0)

## 2020-05-04 LAB — PROCALCITONIN: Procalcitonin: 0.61 ng/mL

## 2020-05-04 LAB — CBC
HCT: 35.5 % — ABNORMAL LOW (ref 39.0–52.0)
Hemoglobin: 10.8 g/dL — ABNORMAL LOW (ref 13.0–17.0)
MCH: 26.9 pg (ref 26.0–34.0)
MCHC: 30.4 g/dL (ref 30.0–36.0)
MCV: 88.3 fL (ref 80.0–100.0)
Platelets: 171 10*3/uL (ref 150–400)
RBC: 4.02 MIL/uL — ABNORMAL LOW (ref 4.22–5.81)
RDW: 17.3 % — ABNORMAL HIGH (ref 11.5–15.5)
WBC: 8.1 10*3/uL (ref 4.0–10.5)
nRBC: 0.4 % — ABNORMAL HIGH (ref 0.0–0.2)

## 2020-05-04 LAB — PHOSPHORUS
Phosphorus: 2.8 mg/dL (ref 2.5–4.6)
Phosphorus: 3.6 mg/dL (ref 2.5–4.6)

## 2020-05-04 LAB — LACTIC ACID, PLASMA: Lactic Acid, Venous: 1.9 mmol/L (ref 0.5–1.9)

## 2020-05-04 LAB — LACTATE DEHYDROGENASE
LDH: 319 U/L — ABNORMAL HIGH (ref 98–192)
LDH: 300 U/L — ABNORMAL HIGH (ref 98–192)

## 2020-05-04 LAB — APTT
aPTT: 40 seconds — ABNORMAL HIGH (ref 24–36)
aPTT: 60 seconds — ABNORMAL HIGH (ref 24–36)

## 2020-05-04 LAB — MAGNESIUM
Magnesium: 1.7 mg/dL (ref 1.7–2.4)
Magnesium: 2.2 mg/dL (ref 1.7–2.4)

## 2020-05-04 LAB — GLUCOSE, CAPILLARY
Glucose-Capillary: 80 mg/dL (ref 70–99)
Glucose-Capillary: 88 mg/dL (ref 70–99)
Glucose-Capillary: 97 mg/dL (ref 70–99)

## 2020-05-04 LAB — HEPARIN LEVEL (UNFRACTIONATED)
Heparin Unfractionated: >2.2 IU/mL — ABNORMAL HIGH (ref 0.30–0.70)
Heparin Unfractionated: >2.2 IU/mL — ABNORMAL HIGH (ref 0.30–0.70)

## 2020-05-04 SURGERY — RIGHT/LEFT HEART CATH AND CORONARY/GRAFT ANGIOGRAPHY
Anesthesia: LOCAL

## 2020-05-04 SURGERY — CORONARY/GRAFT ANGIOGRAPHY
Anesthesia: LOCAL

## 2020-05-04 MED ORDER — LABETALOL HCL 5 MG/ML IV SOLN: 10.0000 mg | INTRAVENOUS | Status: AC | PRN

## 2020-05-04 MED ORDER — MIDAZOLAM HCL 2 MG/2ML IJ SOLN
INTRAMUSCULAR | Status: DC | PRN
  Administered 2020-05-04: 1 mg via INTRAVENOUS

## 2020-05-04 MED ORDER — SODIUM CHLORIDE 0.9% FLUSH
3.0000 mL | Freq: Two times a day (BID) | INTRAVENOUS | Status: DC
  Administered 2020-05-04 – 2020-05-22 (×19): 3 mL via INTRAVENOUS

## 2020-05-04 MED ORDER — ACETAMINOPHEN 325 MG PO TABS: 650.0000 mg | ORAL_TABLET | ORAL | Status: DC | PRN

## 2020-05-04 MED ORDER — HEPARIN (PORCINE) 25000 UT/250ML-% IV SOLN
200.0000 [IU]/h | INTRAVENOUS | Status: DC
  Administered 2020-05-05: 200 [IU]/h via INTRAVENOUS
  Filled 2020-05-04 (×2): qty 250

## 2020-05-04 MED ORDER — SODIUM CHLORIDE 0.9% FLUSH: 3.0000 mL | Freq: Two times a day (BID) | INTRAVENOUS | Status: DC

## 2020-05-04 MED ORDER — HEPARIN (PORCINE) IN NACL 1000-0.9 UT/500ML-% IV SOLN
INTRAVENOUS | Status: AC
  Filled 2020-05-04: qty 1000

## 2020-05-04 MED ORDER — MIDAZOLAM HCL 2 MG/2ML IJ SOLN
INTRAMUSCULAR | Status: AC
  Filled 2020-05-04: qty 2

## 2020-05-04 MED ORDER — SODIUM CHLORIDE 0.9% FLUSH: 3.0000 mL | INTRAVENOUS | Status: DC | PRN

## 2020-05-04 MED ORDER — HEPARIN (PORCINE) 25000 UT/250ML-% IV SOLN
200.0000 [IU]/h | INTRAVENOUS | Status: DC
  Administered 2020-05-04: 200 [IU]/h via INTRAVENOUS

## 2020-05-04 MED ORDER — SODIUM CHLORIDE 0.9 % IV SOLN
2.0000 g | Freq: Two times a day (BID) | INTRAVENOUS | Status: DC
  Administered 2020-05-04 – 2020-05-05 (×4): 2 g via INTRAVENOUS
  Filled 2020-05-04 (×4): qty 2

## 2020-05-04 MED ORDER — SODIUM CHLORIDE 0.9 % IV SOLN: 250.0000 mL | INTRAVENOUS | Status: DC | PRN

## 2020-05-04 MED ORDER — SODIUM CHLORIDE 0.9 % IV SOLN: INTRAVENOUS | Status: AC

## 2020-05-04 MED ORDER — SODIUM CHLORIDE 0.9 % IV SOLN: INTRAVENOUS | Status: DC

## 2020-05-04 MED ORDER — SODIUM CHLORIDE 0.9 % IV BOLUS
250.0000 mL | Freq: Once | INTRAVENOUS | Status: AC
  Administered 2020-05-04: 250 mL via INTRAVENOUS

## 2020-05-04 MED ORDER — BISACODYL 10 MG RE SUPP
10.0000 mg | Freq: Every day | RECTAL | Status: DC | PRN
  Filled 2020-05-04: qty 1

## 2020-05-04 MED ORDER — LIDOCAINE HCL (PF) 1 % IJ SOLN
INTRAMUSCULAR | Status: AC
  Filled 2020-05-04: qty 30

## 2020-05-04 MED ORDER — FENTANYL CITRATE (PF) 100 MCG/2ML IJ SOLN
INTRAMUSCULAR | Status: AC
  Filled 2020-05-04: qty 2

## 2020-05-04 MED ORDER — ONDANSETRON HCL 4 MG/2ML IJ SOLN
4.0000 mg | Freq: Four times a day (QID) | INTRAMUSCULAR | Status: DC | PRN
  Administered 2020-05-05 – 2020-05-23 (×12): 4 mg via INTRAVENOUS
  Filled 2020-05-04 (×12): qty 2

## 2020-05-04 MED ORDER — ACETAMINOPHEN 160 MG/5ML PO SOLN: 650.0000 mg | ORAL | Status: DC | PRN

## 2020-05-04 MED ORDER — FENTANYL CITRATE (PF) 100 MCG/2ML IJ SOLN
INTRAMUSCULAR | Status: DC | PRN
  Administered 2020-05-04: 25 ug via INTRAVENOUS

## 2020-05-04 MED ORDER — IOHEXOL 350 MG/ML SOLN
INTRAVENOUS | Status: DC | PRN
  Administered 2020-05-04: 100 mL

## 2020-05-04 MED ORDER — SODIUM CHLORIDE 0.9 % IV SOLN
250.0000 mL | INTRAVENOUS | Status: DC | PRN
  Administered 2020-05-08 – 2020-05-16 (×2): 250 mL via INTRAVENOUS

## 2020-05-04 MED ORDER — HYDRALAZINE HCL 20 MG/ML IJ SOLN: 10.0000 mg | INTRAMUSCULAR | Status: AC | PRN

## 2020-05-04 MED ORDER — DIGOXIN 125 MCG PO TABS
0.1250 mg | ORAL_TABLET | Freq: Every day | ORAL | Status: DC
  Administered 2020-05-04 – 2020-05-05 (×2): 0.125 mg
  Filled 2020-05-04 (×2): qty 1

## 2020-05-04 MED ORDER — MAGNESIUM SULFATE 2 GM/50ML IV SOLN
2.0000 g | Freq: Once | INTRAVENOUS | Status: AC
  Administered 2020-05-04: 2 g via INTRAVENOUS
  Filled 2020-05-04: qty 50

## 2020-05-04 MED ORDER — HEPARIN (PORCINE) IN NACL 1000-0.9 UT/500ML-% IV SOLN
INTRAVENOUS | Status: DC | PRN
  Administered 2020-05-04 (×2): 500 mL

## 2020-05-04 MED ORDER — LIDOCAINE HCL (PF) 1 % IJ SOLN
INTRAMUSCULAR | Status: DC | PRN
  Administered 2020-05-04: 10 mL

## 2020-05-04 MED ORDER — ASPIRIN 81 MG PO CHEW: 81.0000 mg | CHEWABLE_TABLET | ORAL | Status: AC

## 2020-05-04 MED ORDER — VANCOMYCIN HCL 750 MG/150ML IV SOLN
750.0000 mg | INTRAVENOUS | Status: DC
  Administered 2020-05-04 – 2020-05-05 (×2): 750 mg via INTRAVENOUS
  Filled 2020-05-04 (×3): qty 150

## 2020-05-04 MED ORDER — MILRINONE LACTATE IN DEXTROSE 20-5 MG/100ML-% IV SOLN
0.1250 ug/kg/min | INTRAVENOUS | Status: DC
  Administered 2020-05-05: 0.25 ug/kg/min via INTRAVENOUS
  Filled 2020-05-04: qty 100

## 2020-05-04 SURGICAL SUPPLY — 12 items
CATH INFINITI 5 FR LCB (CATHETERS) ×1 IMPLANT
CATH INFINITI 5 FR MPA2 (CATHETERS) ×1 IMPLANT
CATH INFINITI 5FR MULTPACK ANG (CATHETERS) ×1 IMPLANT
KIT HEART LEFT (KITS) ×2 IMPLANT
KIT MICROPUNCTURE NIT STIFF (SHEATH) IMPLANT
MAT PREVALON FULL STRYKER (MISCELLANEOUS) ×1 IMPLANT
PACK CARDIAC CATHETERIZATION (CUSTOM PROCEDURE TRAY) ×2 IMPLANT
SHEATH PINNACLE 5F 10CM (SHEATH) ×1 IMPLANT
SHEATH PROBE COVER 6X72 (BAG) ×1 IMPLANT
TRANSDUCER W/STOPCOCK (MISCELLANEOUS) ×2 IMPLANT
TUBING CIL FLEX 10 FLL-RA (TUBING) ×2 IMPLANT
WIRE EMERALD 3MM-J .035X150CM (WIRE) ×1 IMPLANT

## 2020-05-04 NOTE — Progress Notes (Signed)
NAME:  Cody Arellano, MRN:  [id number], DOB:  1945-10-10, LOS: 8 ADMISSION DATE:  04/26/2020, CONSULTATION DATE:  05/04/20 REFERRING MD:  Julian Hy, DO, CHIEF COMPLAINT:  DOE   Brief History   Patient is a 74 year old male I was asked to evaluate at bedside due to hypotension felt secondary to cardiogenic shock.  He currently is being evaluated by pulmonary critical care recurrent effusion and pulmonary nodules.  History of present illness   Patient was admitted with pleural effusion cardiomyopathy with ejection fraction of 30%, chronic A. fib and SLE on Imuran and Plaquenil.  Patient developed hypotension worse than baseline this evening.  On my evaluation the patient is awake alert interactive says he feels weak and tired.  He does not complain of dyspnea.  O2 saturation in the high 90s on room air.  He is not breathing rapidly nor using accessory muscles.  Chest x-ray shows increased interstitial markings bilaterally blood pressure is 92/70.  Hemoglobin is 11 and creatinine is stable at 1.49, platelet count 180.  Plan is for transfer to the ICU and initiation of milrinone.  BNP is 1600.  Past Medical History  CHF w/ reduced EF, mitral regurg, CAD, Afib, SLE  Significant Hospital Events   Admitted 11/23 thora 11/23  Consults:  PCCM VIR  Procedures:  Inocencio Homes 11/24 Bronchoscopy impella 11/30 Heart catherization 12/1  Significant Diagnostic Tests:  CT chest 11/23 - R>L bilateral pleural effusions, stable nodules largest 2 in LUL, pulmonary edema, bullous emphysematous changes scattered throughout  Pleural fluid transudate 11/24  Micro Data:  Pleural fluid CX 11/24 - pending BAL CX 11/26 - Neg BAL AF smear - neg BAL AFB -pending BAL Fungal - no fungus BAL PJP -negative BAL Asp antigen 0.09 Antimicrobials:  Ceftriaxone 11/27> 11/28 Azithro 11/27> 11/28 Cefepime 12/1> Vancomycin 12/1>  Interim history/subjective:  Impella placed yesterday, remained intubated overnight.  Cath today.  Objective   Blood pressure 118/83, pulse (!) 115, temperature 99.9 F (37.7 C), resp. rate 18, height 6' (1.829 m), weight 62.8 kg, SpO2 99 %. PAP: (17-35)/(7-20) 30/10 CVP:  [2 mmHg-38 mmHg] 3 mmHg PCWP:  [16 mmHg] 16 mmHg CO:  [3.2 L/min-4.2 L/min] 4.1 L/min CI:  [1.8 L/min/m2-2.3 L/min/m2] 2.3 L/min/m2  Vent Mode: PRVC FiO2 (%):  [30 %-40 %] 30 % Set Rate:  [18 bmp] 18 bmp Vt Set:  [620 mL] 620 mL PEEP:  [5 cmH20] 5 cmH20 Plateau Pressure:  [13 cmH20-16 cmH20] 14 cmH20   Intake/Output Summary (Last 24 hours) at 05/04/2020 1921 Last data filed at 05/04/2020 1900 Gross per 24 hour  Intake 3723.4 ml  Output 1205 ml  Net 2518.4 ml   Filed Weights   05/01/20 2221 05/03/20 0500 05/04/20 0600  Weight: 59.4 kg 60.1 kg 62.8 kg   Physical Exam: General: Cachectic, chronically ill-appearing man lying in bed no acute distress HEENT: Temporal wasting, endotracheal tube in place Respiratory: Breathing comfortably on mechanical ventilation, clear to auscultation bilaterally. Cardiovascular: Tachycardic, irregular rhythm Extremities: Minimal muscle mass, no clubbing or cyanosis Neuro: RASS 0, following commands Skin: No rashes or ecchymosis  Resolved Hospital Problem list   n/a  Assessment & Plan:   Cardiogenic Shock, status post Impella placement for mechanical support Acute on chronic HFrEF Atrial fibrillation/flutter ICM (EF 25%), severe MR, severe TR, s/p CABG 10/2019 Has incompletely revascularized CAD -Impella 5.5; routine daily hemolysis labs.  Appreciate cardiology's assistance.  Continue running P8. -Intolerant to guideline directed heart failure therapy due to shock-no beta-blocker, ACE inhibitor,  ARB, or Entresto -Con't amiodarone -Continue milrinone -Continue norepinephrine as required to maintain MAP greater than 65 -Diuresis limited due to impella suction alarms -Eventually will need DCCV when more compensated  AKI due to cardiorenal  syndrome --Continue mechanical cardiac support -Renally dose meds and avoid nephrotoxic meds -Continue to monitor  Fevers, concern for potential infection -Broad-spectrum antibiotics-cefepime and vancomycin given critical illness and immunosuppressed at his -Reculture  Transudative pleural effusion secondary to heart failure -BAL cultures remain negative to date- con't to follow cultures until finalized  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  Titrate PEEP and FiO2 per ARDS protocol -Daily SAT and SBT.  Planning for extubation tomorrow morning to BiPAP. 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 Wife will be at bedside tomorrow.  Daily Goals Checklist  Pain/Anxiety/Delirium protocol (if indicated): None required VAP protocol (if indicated): Not intubated Blood pressure target: Titrate DVT prophylaxis: On apixaban Nutrition Status: High nutritional risk, dietary consult.  Trial of Megace GI prophylaxis: Not indicated Urinary catheter: Condom catheter Central lines: PICC line Glucose control: Currently euglycemic on no therapy Mobility/therapy needs: PT consult Code Status: Full code Family Communication: Updated patient Disposition: ICU  Labs   CBC: Recent Labs  Lab 05/01/20 0528 05/01/20 0528 05/02/20 0444 05/02/20 0444 05/03/20 0349 05/03/20 1043 05/03/20 1334 05/03/20 2206 05/04/20 0500   WBC 3.8*  --  3.4*  --  5.2  --   --  6.8 8.1  HGB 11.6*   < > 11.2*   < > 11.6* 15.0 13.6 11.0* 10.8*  HCT 39.7   < > 37.6*   < > 40.7 44.0 40.0 37.5* 35.5*  MCV 91.1  --  89.5  --  92.1  --   --  90.1 88.3  PLT 167  --  160  --  190  --   --  181 171   < > = values in this interval not displayed.    Basic Metabolic Panel: Recent Labs  Lab 04/28/20 1200 04/29/20 0851 05/01/20 0528 05/01/20 2000 05/02/20 1410 05/02/20 1410 05/02/20 1856 05/02/20 1856 05/03/20 0349 05/03/20 1043 05/03/20 1334 05/03/20 1714 05/04/20 0500  NA 139   < > 143   < > 137   < > 138   < > 133* 138 138 135 135  K 4.0   < > 2.9*   < > 3.0*   < > 3.3*   < > 4.4 4.7 3.6 3.7 4.1  CL 102   < > 100   < > 97*  --  99  --  93*  --   --  97* 99  CO2 25   < > 29   < > 29  --  30  --  24  --   --  22 23  GLUCOSE 130*   < > 100*   < > 191*  --  183*  --  323*  --   --  114* 88  BUN 21   < >  15   < > 12  --  15  --  17  --   --  18 16  CREATININE 1.60*   < > 1.47*   < > 1.55*  --  1.59*  --  1.69*  --   --  1.80* 1.55*  CALCIUM 8.6*   < > 8.8*   < > 8.4*  --  8.3*  --  8.6*  --   --  8.4* 8.4*  MG 2.2  --  2.1  --  2.0  --   --   --   --   --   --   --  1.7  PHOS  --   --   --   --   --   --   --   --   --   --   --   --  2.8   < > = values in this interval not displayed.   GFR: Estimated Creatinine Clearance: 37.1 mL/min (A) (by C-G formula based on SCr of 1.55 mg/dL (H)). Recent Labs  Lab 04/30/20 0248 04/30/20 0248 04/30/20 2038 05/01/20 0528 05/01/20 0528 05/02/20 0444 05/03/20 0349 05/03/20 1757 05/03/20 2206 05/04/20 0500 05/04/20 0823  PROCALCITON 0.18  --   --  0.15  --   --   --   --   --   --  0.61  WBC 3.8*   < >  --  3.8*   < > 3.4* 5.2  --  6.8 8.1  --   LATICACIDVEN  --   --  1.8  --   --   --   --  5.6* 4.6* 1.9  --    < > = values in this interval not displayed.    Liver Function Tests: Recent Labs  Lab 05/04/20 0500  AST 57*  ALT 33  ALKPHOS 56  BILITOT 1.6*  PROT 5.9*   ALBUMIN 2.5*   No results for input(s): LIPASE, AMYLASE in the last 168 hours. No results for input(s): AMMONIA in the last 168 hours.  ABG    Component Value Date/Time   PHART 7.347 (L) 05/03/2020 1334   PCO2ART 46.5 05/03/2020 1334   PO2ART 414 (H) 05/03/2020 1334   HCO3 25.9 05/03/2020 1334   TCO2 27 05/03/2020 1334   ACIDBASEDEF 1.0 05/03/2020 1334   O2SAT 64.8 05/04/2020 1805     Coagulation Profile: No results for input(s): INR, PROTIME in the last 168 hours.  Cardiac Enzymes: No results for input(s): CKTOTAL, CKMB, CKMBINDEX, TROPONINI in the last 168 hours.  HbA1C: Hgb A1c MFr Bld  Date/Time Value Ref Range Status  10/17/2019 12:05 PM 5.7 (H) 4.8 - 5.6 % Final    Comment:    (NOTE) Pre diabetes:          5.7%-6.4% Diabetes:              >6.4% Glycemic control for   <7.0% adults with diabetes      This patient is critically ill with multiple organ system failure which requires frequent high complexity decision making, assessment, support, evaluation, and titration of therapies. This was completed through the application of advanced monitoring technologies and extensive interpretation of multiple databases. During this encounter critical care time was devoted to patient care services described in this note for 51 minutes.  Julian Hy, DO 05/04/20 7:21 PM [facility]

## 2020-05-04 NOTE — Progress Notes (Signed)
ANTICOAGULATION CONSULT NOTE - Follow Up Consult  Pharmacy Consult for heparin Indication: Impella and recent PE  Labs: Recent Labs    05/02/20 0444 05/02/20 1856 05/03/20 0349 05/03/20 1043 05/03/20 1334 05/03/20 1334 05/03/20 1714 05/03/20 2206 05/04/20 0200 05/04/20 0205 05/04/20 0500  HGB   < >  --  11.6*   < > 13.6   < >  --  11.0*  --   --  10.8*  HCT   < >  --  40.7   < > 40.0  --   --  37.5*  --   --  35.5*  PLT   < >  --  190  --   --   --   --  181  --   --  171  APTT  --   --   --   --   --   --  81*  --  40*  --   --   HEPARINUNFRC  --   --   --   --   --   --  >2.20*  --   --  >2.20*  --   CREATININE  --  1.59* 1.69*  --   --   --  1.80*  --   --   --   --    < > = values in this interval not displayed.    Assessment: 74yo male now subtherapeutic on just purge heparin, to add systemic heparin; no gtt issues or signs of bleeding per RN.  Goal of Therapy:  aPTT 66-85 seconds   Plan:  Will start systemic heparin at 200 units/hr (for total ~900 units/hr with ~700 units/hr through Impella purge) and check PTT in 8 hours.    Wynona Neat, PharmD, BCPS  05/04/2020,5:40 AM

## 2020-05-04 NOTE — Plan of Care (Signed)
  Problem: Clinical Measurements: Goal: Ability to maintain clinical measurements within normal limits will improve Outcome: Progressing   Problem: Coping: Goal: Level of anxiety will decrease Outcome: Progressing   Problem: Pain Managment: Goal: General experience of comfort will improve Outcome: Progressing   Problem: Safety: Goal: Ability to remain free from injury will improve Outcome: Progressing   Problem: Skin Integrity: Goal: Risk for impaired skin integrity will decrease Outcome: Progressing

## 2020-05-04 NOTE — Progress Notes (Signed)
Urine leaked from the foley catheter. 38mls of fluid added to balloon and a towel has been placed in perineal area to catch leaked urine. Interventions appears to be working.Will continue to monitor

## 2020-05-04 NOTE — Progress Notes (Addendum)
Patient ID: Cody Arellano, male   DOB: 05-May-1946, 74 y.o.   MRN: [id number] Pat    Advanced Heart Failure Rounding Note  PCP-Cardiologist: Mertie Moores, MD   Subjective:    Impella 5.5 placed on 11/30 with cardiogenic shock.   Currently on milrinone 0.375 + NE 4. MAP around 70. Lactate down to 1.9.   Creatinine 1.59 => 1.69 => 1.8 => 1.55.  Diuretics on hold and has had fluid boluses with CVP low.   CXR with possible PNA right lung.  Tm 100.2.   He is on amiodarone gtt 30 mg/hr with HR in 110s. He was briefly in NSR yesterday pm, now back in afib.   Echo: EF 20-25% with septal akinesis, mid to apical inferior akinesis, apical anterior and apex akinesis.  Moderately dilated and dysfunctional RV.  Moderate TR.  Severe MR.  Bicuspid aortic valve with no stenosis, mild aortic insufficiency.   Impella 5.5 P8  Flow 4.6, no alarms LDH 319  Swan numbers: CVP 3 PA 27/14 CI 2.32 Co-ox 64%   Objective:   Weight Range: 62.8 kg Body mass index is 18.78 kg/m.   Vital Signs:   Temp:  [95.7 F (35.4 C)-100.2 F (37.9 C)] 99.5 F (37.5 C) (12/01 0700) Pulse Rate:  [45-137] 124 (12/01 0700) Resp:  [17-42] 18 (12/01 0700) BP: (76-95)/(38-72) 76/62 (12/01 0334) SpO2:  [88 %-100 %] 100 % (12/01 0700) Arterial Line BP: (74-114)/(62-87) 81/75 (12/01 0700) FiO2 (%):  [40 %-100 %] 40 % (12/01 0334) Weight:  [62.8 kg] 62.8 kg (12/01 0600) Last BM Date: 05/01/20  Weight change: Filed Weights   05/01/20 2221 05/03/20 0500 05/04/20 0600  Weight: 59.4 kg 60.1 kg 62.8 kg    Intake/Output:   Intake/Output Summary (Last 24 hours) at 05/04/2020 0756 Last data filed at 05/04/2020 0700 Gross per 24 hour  Intake 3238.51 ml  Output 2120 ml  Net 1118.51 ml      Physical Exam    General: NAD, awake on vent.  Neck: No JVD, no thyromegaly or thyroid nodule.  Lungs: Crackles on right.  CV: Nondisplaced PMI.  Heart mildly tachy, irregular S1/S2, no S3/S4, 2/6 HSM apex.  No peripheral edema.    Abdomen: Soft, nontender, no hepatosplenomegaly, no distention.  Skin: Intact without lesions or rashes.  Neurologic: Alert   Extremities: No clubbing or cyanosis.  HEENT: Normal.    Telemetry   Atrial fibrillation 110s.  Personally reviewed.   Labs    CBC Recent Labs    05/03/20 2206 05/04/20 0500  WBC 6.8 8.1  HGB 11.0* 10.8*  HCT 37.5* 35.5*  MCV 90.1 88.3  PLT 181 242   Basic Metabolic Panel Recent Labs    05/02/20 1410 05/02/20 1856 05/03/20 1714 05/04/20 0500  NA 137   < > 135 135  K 3.0*   < > 3.7 4.1  CL 97*   < > 97* 99  CO2 29   < > 22 23  GLUCOSE 191*   < > 114* 88  BUN 12   < > 18 16  CREATININE 1.55*   < > 1.80* 1.55*  CALCIUM 8.4*   < > 8.4* 8.4*  MG 2.0  --   --  1.7  PHOS  --   --   --  2.8   < > = values in this interval not displayed.   Liver Function Tests Recent Labs    05/04/20 0500  AST 57*  ALT 33  ALKPHOS 56  BILITOT 1.6*  PROT 5.9*  ALBUMIN 2.5*   No results for input(s): LIPASE, AMYLASE in the last 72 hours. Cardiac Enzymes No results for input(s): CKTOTAL, CKMB, CKMBINDEX, TROPONINI in the last 72 hours.  BNP: BNP (last 3 results) Recent Labs    04/06/20 2134 04/26/20 1319 04/30/20 0248  BNP 1,677.6* 1,824.4* 1,685.5*    ProBNP (last 3 results) No results for input(s): PROBNP in the last 8760 hours.   D-Dimer No results for input(s): DDIMER in the last 72 hours. Hemoglobin A1C No results for input(s): HGBA1C in the last 72 hours. Fasting Lipid Panel Recent Labs    05/03/20 1340  TRIG 65   Thyroid Function Tests Recent Labs    05/03/20 0349  TSH 4.438    Other results:   Imaging    DG Chest 1 View  Result Date: 05/03/2020 CLINICAL DATA:  Impella device placement. EXAM: CHEST  1 VIEW COMPARISON:  Chest x-ray 04/30/2020. FINDINGS: Impella device noted over the mid chest. One image obtained. 3 minutes 10 seconds fluoroscopy. 19.0 mGy. IMPRESSION: Impella device noted over the mid chest.  Electronically Signed   By: Marcello Moores  Register   On: 05/03/2020 11:48   DG CHEST PORT 1 VIEW  Result Date: 05/03/2020 CLINICAL DATA:  Status post central line placement. EXAM: PORTABLE CHEST 1 VIEW COMPARISON:  Single-view of the chest 04/30/2020. FINDINGS: Endotracheal tube is in place with the tip in good position just below the clavicular heads. Impella device projects in good position. Right IJ approach Swan-Ganz catheter tip is in the proximal right main pulmonary artery. No pneumothorax. Airspace disease throughout the right chest is much worse than on the prior study. Patchy airspace opacity in left lung base is unchanged. Heart size is normal. IMPRESSION: Support tubes and lines project in good position.  No pneumothorax. Marked worsening of airspace disease throughout the right chest most worrisome for pneumonia. Patchy airspace disease in left lung base is unchanged. Electronically Signed   By: Inge Rise M.D.   On: 05/03/2020 13:20   DG Abd Portable 1V  Result Date: 05/03/2020 CLINICAL DATA:  Orogastric tube placement. EXAM: PORTABLE ABDOMEN - 1 VIEW COMPARISON:  None. FINDINGS: An orogastric tube is seen with tip overlying the proximal stomach. No evidence of dilated bowel loops. IMPRESSION: Orogastric tube tip overlies the proximal stomach. Electronically Signed   By: Marlaine Hind M.D.   On: 05/03/2020 15:18   DG C-Arm 1-60 Min  Result Date: 05/03/2020 CLINICAL DATA:  Impella device placement. EXAM: DG C-ARM 1-60 MIN CONTRAST:  None. FLUOROSCOPY TIME:  Fluoroscopy Time:  3 minutes 10 seconds Radiation Exposure Index (if provided by the fluoroscopic device): 19.0 mGy Number of Acquired Spot Images: 1 COMPARISON:  Chest x-ray 04/30/2020. FINDINGS: Single view obtained.  Impella device noted over the mid chest. IMPRESSION: Impella device noted over the mid chest. Electronically Signed   By: Marcello Moores  Register   On: 05/03/2020 11:49   ECHOCARDIOGRAM LIMITED  Result Date: 05/03/2020     ECHOCARDIOGRAM LIMITED REPORT   [name] Reth Date of Exam: 05/03/2020 Medical Rec #:  [id number]    Height:       72.0 in Accession #:    [id number]   Weight:       132.5 lb Date of Birth:  05-Dec-1945    BSA:          1.789 m Patient Age:    74 years     BP:  0/0 mmHg Patient Gender: M            HR:           95 bpm. Exam Location:  Inpatient Procedure: Limited Echo STAT ECHO Indications:    impella placement check  History:        Patient has prior history of Echocardiogram examinations, most                 recent 05/03/2020. CHF, CAD; Risk Factors:Dyslipidemia.  Sonographer:    Johny Chess Referring Phys: Haivana Nakya  Conclusion(s)/Recommendation(s): Limited study for impella placement. Measured at 4.52 cm from aortic annulus. LVEF reduced, RV enlarged. IVC dilated. Buford Dresser MD Electronically signed by Buford Dresser MD Signature Date/Time: 05/03/2020/8:05:08 PM    Final      Medications:     Scheduled Medications: . aspirin  81 mg Per Tube Daily  . chlorhexidine gluconate (MEDLINE KIT)  15 mL Mouth Rinse BID  . Chlorhexidine Gluconate Cloth  6 each Topical Daily  . docusate  100 mg Per Tube BID  . feeding supplement  237 mL Per Tube TID BM  . mouth rinse  15 mL Mouth Rinse 10 times per day  . multivitamin with minerals  1 tablet Per Tube Daily  . rosuvastatin  20 mg Per Tube Daily  . sodium chloride flush  10-40 mL Intracatheter Q12H  . sodium chloride flush  3 mL Intravenous Q12H    Infusions: . sodium chloride 10 mL/hr at 05/04/20 0733  . sodium chloride    . amiodarone 30 mg/hr (05/04/20 0700)  . heparin 200 Units/hr (05/04/20 0700)  . impella catheter heparin 50 unit/mL in dextrose 5%    . lactated ringers 20 mL/hr at 05/04/20 0700  . magnesium sulfate bolus IVPB 2 g (05/04/20 0734)  . milrinone 0.375 mcg/kg/min (05/04/20 0700)  . norepinephrine (LEVOPHED) Adult infusion 4 mcg/min (05/04/20 0700)  . propofol (DIPRIVAN)  infusion 15 mcg/kg/min (05/04/20 0700)  . sodium chloride      PRN Medications: Place/Maintain arterial line **AND** sodium chloride, bisacodyl, lidocaine, ondansetron (ZOFRAN) IV, oxyCODONE, polyethylene glycol, sodium chloride flush   Assessment/Plan   1. 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  Echo this admission looks worse than 10/21 with EF 20-25% with septal akinesis, mid to apical inferior akinesis, apical anterior and apex akinesis, moderately dilated and dysfunctional RV, moderate TR, severe MR, bicuspid aortic valve with no stenosis, mild aortic insufficiency. Progressive/end-stage CHF worsened by onset of atrial fibrillation. Cardiogenic shock 11/30 with co-ox down to 30%, Impella 5.5 placed to allow time to stabilize and consider further steps.  Currently with CI 2.32 and co-ox 64% on P8 Impella with no alarms.  He is on milrinone 0.375 and NE 4. CVP 3.  - Limited echo for Impella position. Urine clear, LDH 319.  - Will bolus 250 cc NS this morning.  No Lasix.  - Titrate NE as needed, have been slowly lowering.  - Continue milrinone for now, will hopefully start to wean down soon.  - Can add digoxin 0.125 with improving creatinine.  - I am very concerned about this situation, low cardiac output by co-ox in setting of AF/RVR with severe MR and biventricular failure.  I am concerned for end-stage CHF, he is thin/cachectic.  He now has Impella 5.5 to buy [place] some time to stabilize him and hopefully assess his coronaries and get him out of atrial fibrillation by limiting vasoactive meds. He is not currently an  LVAD candidate, but may be able to stabilize to cardiovert, assess coronaries, and potentially eventually Mitraclip.  2. CAD: Prior history of MI with LAD and ramus PCI. Admitted May 2021 with anterior STEMI. LHC with occluded ostial LAD (in-stent), 90% ostial ramus, 60-70% proximal LCx, nondominant RCA. PTCA to LAD and ramus to restore flow, then  CABG with LIMA-LAD and SVG-ramus.  No chest pain, doubt ACS.  Fall in EF from 10/21 to 11/21 may be due to worsening CAD but he did not present with ACS.   - Continue ASA 81 and statin.  - With creatinine trending down to 1.5 today, will plan coronary angiography later today.  Would like to do this prior to cardioversion so anticoagulation not interrupted. Described to patient (he is on vent) and will try to contact his wife (voicemail this morning).   3. AKI: Creatinine now trending down with improved cardiac output.  4. Right pleural effusion: S/p thoracentesis, transudative (CHF).  5. Atrial fibrillation with RVR: Mild RVR this morning, he is on amiodarone gtt 30 mg/hr and heparin gtt.  - Continue amiodarone gtt for rate control.  - Eventual TEE-guided DCCV but would like to wean down vasoactive meds first.  - With improving creatinine, add digoxin for rate control.  6. Mitral regurgitation: Severe, ?infarct-related MR. - ?Eventual Mitraclip candidate if he can be stabilized.  7. H/o PE: In 10/21.  Keep anticoagulated.  8. Bicuspid aortic valve: Mild AI, no AS.  9. SLE: H/o pericarditis.  On Imuran and hydroxychloroquine on. Holding hydroxychloroquine for now with risk for QT prolongation.  10. Pulmonary nodules/emphysema: CCM following, s/p bronch/BAL (no growth).  11. ID: Concern for right-sided PNA, ?aspiration.  Tm 100.2, WBCs normal. - Blood cultures and procalcitonin will be sent. Trach aspirate.  - Treat empirically with vancomycin/cefepime.   CRITICAL CARE Performed by: Loralie Champagne  Total critical care time: 40 minutes  Critical care time was exclusive of separately billable procedures and treating other patients.  Critical care was necessary to treat or prevent imminent or life-threatening deterioration.  Critical care was time spent personally by me on the following activities: development of treatment plan with patient and/or surrogate as well as nursing, discussions with  consultants, evaluation of patient's response to treatment, examination of patient, obtaining history from patient or surrogate, ordering and performing treatments and interventions, ordering and review of laboratory studies, ordering and review of radiographic studies, pulse oximetry and re-evaluation of patient's condition.    Length of Stay: 8  Loralie Champagne, MD  05/04/2020, 7:56 AM  Advanced Heart Failure Team Pager [phone] (M-F; 7a - 4p)  Please contact [facility] for night-coverage after hours (4p -7a ) and weekends on [email or web address]

## 2020-05-04 NOTE — Progress Notes (Signed)
Patient was transported to the CATH lab & back to 2H17 without any complications.

## 2020-05-04 NOTE — Interval H&P Note (Signed)
History and Physical Interval Note:  05/04/2020 3:14 PM  Cody Arellano  has presented today for surgery, with the diagnosis of heart failure.  The various methods of treatment have been discussed with the patient and family. After consideration of risks, benefits and other options for treatment, the patient has consented to  Procedure(s): LEFT HEART CATH AND CORS/GRAFTS ANGIOGRAPHY (N/A) as a surgical intervention.  The patient's history has been reviewed, patient examined, no change in status, stable for surgery.  I have reviewed the patient's chart and labs.  Questions were answered to the patient's satisfaction.     Jolene Guyett [employer]

## 2020-05-04 NOTE — H&P (View-Only) (Signed)
Patient ID: Cody Arellano, male   DOB: 03/30/1946, 74 y.o.   MRN: [id number] Pat    Advanced Heart Failure Rounding Note  PCP-Cardiologist: Philip Nahser, MD   Subjective:    Impella 5.5 placed on 11/30 with cardiogenic shock.   Currently on milrinone 0.375 + NE 4. MAP around 70. Lactate down to 1.9.   Creatinine 1.59 => 1.69 => 1.8 => 1.55.  Diuretics on hold and has had fluid boluses with CVP low.   CXR with possible PNA right lung.  Tm 100.2.   He is on amiodarone gtt 30 mg/hr with HR in 110s. He was briefly in NSR yesterday pm, now back in afib.   Echo: EF 20-25% with septal akinesis, mid to apical inferior akinesis, apical anterior and apex akinesis.  Moderately dilated and dysfunctional RV.  Moderate TR.  Severe MR.  Bicuspid aortic valve with no stenosis, mild aortic insufficiency.   Impella 5.5 P8  Flow 4.6, no alarms LDH 319  [place] numbers: CVP 3 PA 27/14 CI 2.32 Co-ox 64%   Objective:   Weight Range: 62.8 kg Body mass index is 18.78 kg/m.   Vital Signs:   Temp:  [95.7 F (35.4 C)-100.2 F (37.9 C)] 99.5 F (37.5 C) (12/01 0700) Pulse Rate:  [45-137] 124 (12/01 0700) Resp:  [17-42] 18 (12/01 0700) BP: (76-95)/(38-72) 76/62 (12/01 0334) SpO2:  [88 %-100 %] 100 % (12/01 0700) Arterial Line BP: (74-114)/(62-87) 81/75 (12/01 0700) FiO2 (%):  [40 %-100 %] 40 % (12/01 0334) Weight:  [62.8 kg] 62.8 kg (12/01 0600) Last BM Date: 05/01/20  Weight change: Filed Weights   05/01/20 2221 05/03/20 0500 05/04/20 0600  Weight: 59.4 kg 60.1 kg 62.8 kg    Intake/Output:   Intake/Output Summary (Last 24 hours) at 05/04/2020 0756 Last data filed at 05/04/2020 0700 Gross per 24 hour  Intake 3238.51 ml  Output 2120 ml  Net 1118.51 ml      Physical Exam    General: NAD, awake on vent.  Neck: No JVD, no thyromegaly or thyroid nodule.  Lungs: Crackles on right.  CV: Nondisplaced PMI.  Heart mildly tachy, irregular S1/S2, no S3/S4, 2/6 HSM apex.  No peripheral edema.    Abdomen: Soft, nontender, no hepatosplenomegaly, no distention.  Skin: Intact without lesions or rashes.  Neurologic: Alert   Extremities: No clubbing or cyanosis.  HEENT: Normal.    Telemetry   Atrial fibrillation 110s.  Personally reviewed.   Labs    CBC Recent Labs    05/03/20 2206 05/04/20 0500  WBC 6.8 8.1  HGB 11.0* 10.8*  HCT 37.5* 35.5*  MCV 90.1 88.3  PLT 181 171   Basic Metabolic Panel Recent Labs    05/02/20 1410 05/02/20 1856 05/03/20 1714 05/04/20 0500  NA 137   < > 135 135  K 3.0*   < > 3.7 4.1  CL 97*   < > 97* 99  CO2 29   < > 22 23  GLUCOSE 191*   < > 114* 88  BUN 12   < > 18 16  CREATININE 1.55*   < > 1.80* 1.55*  CALCIUM 8.4*   < > 8.4* 8.4*  MG 2.0  --   --  1.7  PHOS  --   --   --  2.8   < > = values in this interval not displayed.   Liver Function Tests Recent Labs    05/04/20 0500  AST 57*  ALT 33  ALKPHOS 56    BILITOT 1.6*  PROT 5.9*  ALBUMIN 2.5*   No results for input(s): LIPASE, AMYLASE in the last 72 hours. Cardiac Enzymes No results for input(s): CKTOTAL, CKMB, CKMBINDEX, TROPONINI in the last 72 hours.  BNP: BNP (last 3 results) Recent Labs    04/06/20 2134 04/26/20 1319 04/30/20 0248  BNP 1,677.6* 1,824.4* 1,685.5*    ProBNP (last 3 results) No results for input(s): PROBNP in the last 8760 hours.   D-Dimer No results for input(s): DDIMER in the last 72 hours. Hemoglobin A1C No results for input(s): HGBA1C in the last 72 hours. Fasting Lipid Panel Recent Labs    05/03/20 1340  TRIG 65   Thyroid Function Tests Recent Labs    05/03/20 0349  TSH 4.438    Other results:   Imaging    DG Chest 1 View  Result Date: 05/03/2020 CLINICAL DATA:  Impella device placement. EXAM: CHEST  1 VIEW COMPARISON:  Chest x-ray 04/30/2020. FINDINGS: Impella device noted over the mid chest. One image obtained. 3 minutes 10 seconds fluoroscopy. 19.0 mGy. IMPRESSION: Impella device noted over the mid chest.  Electronically Signed   By: Thomas  Register   On: 05/03/2020 11:48   DG CHEST PORT 1 VIEW  Result Date: 05/03/2020 CLINICAL DATA:  Status post central line placement. EXAM: PORTABLE CHEST 1 VIEW COMPARISON:  Single-view of the chest 04/30/2020. FINDINGS: Endotracheal tube is in place with the tip in good position just below the clavicular heads. Impella device projects in good position. Right IJ approach Swan-Ganz catheter tip is in the proximal right main pulmonary artery. No pneumothorax. Airspace disease throughout the right chest is much worse than on the prior study. Patchy airspace opacity in left lung base is unchanged. Heart size is normal. IMPRESSION: Support tubes and lines project in good position.  No pneumothorax. Marked worsening of airspace disease throughout the right chest most worrisome for pneumonia. Patchy airspace disease in left lung base is unchanged. Electronically Signed   By: Thomas  Dalessio M.D.   On: 05/03/2020 13:20   DG Abd Portable 1V  Result Date: 05/03/2020 CLINICAL DATA:  Orogastric tube placement. EXAM: PORTABLE ABDOMEN - 1 VIEW COMPARISON:  None. FINDINGS: An orogastric tube is seen with tip overlying the proximal stomach. No evidence of dilated bowel loops. IMPRESSION: Orogastric tube tip overlies the proximal stomach. Electronically Signed   By: John A Stahl M.D.   On: 05/03/2020 15:18   DG C-Arm 1-60 Min  Result Date: 05/03/2020 CLINICAL DATA:  Impella device placement. EXAM: DG C-ARM 1-60 MIN CONTRAST:  None. FLUOROSCOPY TIME:  Fluoroscopy Time:  3 minutes 10 seconds Radiation Exposure Index (if provided by the fluoroscopic device): 19.0 mGy Number of Acquired Spot Images: 1 COMPARISON:  Chest x-ray 04/30/2020. FINDINGS: Single view obtained.  Impella device noted over the mid chest. IMPRESSION: Impella device noted over the mid chest. Electronically Signed   By: Thomas  Register   On: 05/03/2020 11:49   ECHOCARDIOGRAM LIMITED  Result Date: 05/03/2020     ECHOCARDIOGRAM LIMITED REPORT   [name] Date of Exam: 05/03/2020 Medical Rec #:  [id number]    Height:       72.0 in Accession #:    [id number]   Weight:       132.5 lb Date of Birth:  10/18/1945    BSA:          1.789 m Patient Age:    74 years     BP:             0/0 mmHg Patient Gender: M            HR:           95 bpm. Exam Location:  Inpatient Procedure: Limited Echo STAT ECHO Indications:    impella placement check  History:        Patient has prior history of Echocardiogram examinations, most                 recent 05/03/2020. CHF, CAD; Risk Factors:Dyslipidemia.  Sonographer:    Lauren Pennington Referring Phys: [id number] Pax Reasoner S Nehemyah Foushee  Conclusion(s)/Recommendation(s): Limited study for impella placement. Measured at 4.52 cm from aortic annulus. LVEF reduced, RV enlarged. IVC dilated. Bridgette Christopher MD Electronically signed by Bridgette Christopher MD Signature Date/Time: 05/03/2020/8:05:08 PM    Final      Medications:     Scheduled Medications: . aspirin  81 mg Per Tube Daily  . chlorhexidine gluconate (MEDLINE KIT)  15 mL Mouth Rinse BID  . Chlorhexidine Gluconate Cloth  6 each Topical Daily  . docusate  100 mg Per Tube BID  . feeding supplement  237 mL Per Tube TID BM  . mouth rinse  15 mL Mouth Rinse 10 times per day  . multivitamin with minerals  1 tablet Per Tube Daily  . rosuvastatin  20 mg Per Tube Daily  . sodium chloride flush  10-40 mL Intracatheter Q12H  . sodium chloride flush  3 mL Intravenous Q12H    Infusions: . sodium chloride 10 mL/hr at 05/04/20 0733  . sodium chloride    . amiodarone 30 mg/hr (05/04/20 0700)  . heparin 200 Units/hr (05/04/20 0700)  . impella catheter heparin 50 unit/mL in dextrose 5%    . lactated ringers 20 mL/hr at 05/04/20 0700  . magnesium sulfate bolus IVPB 2 g (05/04/20 0734)  . milrinone 0.375 mcg/kg/min (05/04/20 0700)  . norepinephrine (LEVOPHED) Adult infusion 4 mcg/min (05/04/20 0700)  . propofol (DIPRIVAN)  infusion 15 mcg/kg/min (05/04/20 0700)  . sodium chloride      PRN Medications: Place/Maintain arterial line **AND** sodium chloride, bisacodyl, lidocaine, ondansetron (ZOFRAN) IV, oxyCODONE, polyethylene glycol, sodium chloride flush   Assessment/Plan   1. 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  Echo this admission looks worse than 10/21 with EF 20-25% with septal akinesis, mid to apical inferior akinesis, apical anterior and apex akinesis, moderately dilated and dysfunctional RV, moderate TR, severe MR, bicuspid aortic valve with no stenosis, mild aortic insufficiency. Progressive/end-stage CHF worsened by onset of atrial fibrillation. Cardiogenic shock 11/30 with co-ox down to 30%, Impella 5.5 placed to allow time to stabilize and consider further steps.  Currently with CI 2.32 and co-ox 64% on P8 Impella with no alarms.  He is on milrinone 0.375 and NE 4. CVP 3.  - Limited echo for Impella position. Urine clear, LDH 319.  - Will bolus 250 cc NS this morning.  No Lasix.  - Titrate NE as needed, have been slowly lowering.  - Continue milrinone for now, will hopefully start to wean down soon.  - Can add digoxin 0.125 with improving creatinine.  - I am very concerned about this situation, low cardiac output by co-ox in setting of AF/RVR with severe MR and biventricular failure.  I am concerned for end-stage CHF, he is thin/cachectic.  He now has Impella 5.5 to buy us some time to stabilize him and hopefully assess his coronaries and get him out of atrial fibrillation by limiting vasoactive meds. He is not currently an   LVAD candidate, but may be able to stabilize to cardiovert, assess coronaries, and potentially eventually Mitraclip.  2. CAD: Prior history of MI with LAD and ramus PCI. Admitted May 2021 with anterior STEMI. LHC with occluded ostial LAD (in-stent), 90% ostial ramus, 60-70% proximal LCx, nondominant RCA. PTCA to LAD and ramus to restore flow, then  CABG with LIMA-LAD and SVG-ramus.  No chest pain, doubt ACS.  Fall in EF from 10/21 to 11/21 may be due to worsening CAD but he did not present with ACS.   - Continue ASA 81 and statin.  - With creatinine trending down to 1.5 today, will plan coronary angiography later today.  Would like to do this prior to cardioversion so anticoagulation not interrupted. Described to patient (he is on vent) and will try to contact his wife (voicemail this morning).   3. AKI: Creatinine now trending down with improved cardiac output.  4. Right pleural effusion: S/p thoracentesis, transudative (CHF).  5. Atrial fibrillation with RVR: Mild RVR this morning, he is on amiodarone gtt 30 mg/hr and heparin gtt.  - Continue amiodarone gtt for rate control.  - Eventual TEE-guided DCCV but would like to wean down vasoactive meds first.  - With improving creatinine, add digoxin for rate control.  6. Mitral regurgitation: Severe, ?infarct-related MR. - ?Eventual Mitraclip candidate if he can be stabilized.  7. H/o PE: In 10/21.  Keep anticoagulated.  8. Bicuspid aortic valve: Mild AI, no AS.  9. SLE: H/o pericarditis.  On Imuran and hydroxychloroquine on. Holding hydroxychloroquine for now with risk for QT prolongation.  10. Pulmonary nodules/emphysema: CCM following, s/p bronch/BAL (no growth).  11. ID: Concern for right-sided PNA, ?aspiration.  Tm 100.2, WBCs normal. - Blood cultures and procalcitonin will be sent. Trach aspirate.  - Treat empirically with vancomycin/cefepime.   CRITICAL CARE Performed by: Loralie Champagne  Total critical care time: 40 minutes  Critical care time was exclusive of separately billable procedures and treating other patients.  Critical care was necessary to treat or prevent imminent or life-threatening deterioration.  Critical care was time spent personally by me on the following activities: development of treatment plan with patient and/or surrogate as well as nursing, discussions with  consultants, evaluation of patient's response to treatment, examination of patient, obtaining history from patient or surrogate, ordering and performing treatments and interventions, ordering and review of laboratory studies, ordering and review of radiographic studies, pulse oximetry and re-evaluation of patient's condition.    Length of Stay: 8  Loralie Champagne, MD  05/04/2020, 7:56 AM  Advanced Heart Failure Team Pager [phone] (M-F; 7a - 4p)  Please contact [facility] for night-coverage after hours (4p -7a ) and weekends on [email or web address]

## 2020-05-04 NOTE — Progress Notes (Signed)
  Echocardiogram 2D Echocardiogram has been performed.  Cody Arellano 05/04/2020, 9:49 AM

## 2020-05-04 NOTE — Progress Notes (Signed)
ANTICOAGULATION CONSULT NOTE  Pharmacy Consult for heparin Indication: Impella and hx PE  No Known Allergies  Patient Measurements: Height: 6' (182.9 cm) Weight: 62.8 kg (138 lb 7.2 oz) IBW/kg (Calculated) : 77.6 Heparin Dosing Weight: 60kg  Vital Signs: Temp: 99.9 F (37.7 C) (12/01 1300) Temp Source: Core (12/01 1200) BP: 78/64 (12/01 1120) Pulse Rate: 110 (12/01 1300)  Labs: Recent Labs    05/03/20 0349 05/03/20 1043 05/03/20 1334 05/03/20 1334 05/03/20 1714 05/03/20 2206 05/04/20 0200 05/04/20 0205 05/04/20 0500 05/04/20 1219  HGB 11.6*   < > 13.6   < >  --  11.0*  --   --  10.8*  --   HCT 40.7   < > 40.0  --   --  37.5*  --   --  35.5*  --   PLT 190  --   --   --   --  181  --   --  171  --   APTT  --   --   --   --  81*  --  40*  --   --  60*  HEPARINUNFRC  --   --   --   --  >2.20*  --   --  >2.20*  --   --   CREATININE 1.69*  --   --   --  1.80*  --   --   --  1.55*  --    < > = values in this interval not displayed.    Estimated Creatinine Clearance: 37.1 mL/min (A) (by C-G formula based on SCr of 1.55 mg/dL (H)).   Medical History: Past Medical History:  Diagnosis Date  . Anxiety   . CHF (congestive heart failure) ([place])   . Chronic tension headache   . Colon polyps   . Coronary artery disease 2008/2009   MI with PCI x 2, then PCI x 1 in 2009  . Depression   . Dyslipidemia   . Dysrhythmia    atrial fibrillation  . Hyperlipidemia   . Hypertension   . Lupus [facility])    sees Dr Trudie Reed  . Lupus disease of the lung   . 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 Myocardial infarction ([place]) 2008  . 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 Shortness of breath     Assessment: 54 yoM with hx recent PE (03/2020) on apixaban PTA admitted with ADHF now s/p Impella placement. Heparin infusing via the purge solution and systemic heparin. Repeat aPTT is subtherapeutic at 60 seconds, however heparin to be paused  for cardiac cath soon so will not adjust.  Goal of Therapy:  Heparin level 0.3-0.5 units/ml aPTT 66-85 seconds Monitor platelets by anticoagulation protocol: Yes   Plan:  Impella via purge solution (50 units/ml) continues Heparin 200 units systemic for now F/U heparin plans after cath   Arrie Senate, PharmD, BCPS, [facility] Clinical Pharmacist [phone] Please check AMION for all [place] numbers 05/04/2020

## 2020-05-04 NOTE — Progress Notes (Signed)
Pharmacy Antibiotic Note  Cody Arellano is a 74 y.o. male admitted on 04/26/2020 with cardiogenic shock requiring Impella support. Pharmacy has been consulted for vancomycin and cefepime dosing with concern for PNA. Cr has been relatively stable.  Plan: Vancomycin 750mg  q24h Cefepime 2g q12h Follow cultures, LOT, fever curve Vancomycin levels as needed   Height: 6' (182.9 cm) Weight: 62.8 kg (138 lb 7.2 oz) IBW/kg (Calculated) : 77.6  Temp (24hrs), Avg:97.6 F (36.4 C), Min:95.7 F (35.4 C), Max:100.2 F (37.9 C)  Recent Labs  Lab 04/30/20 0248 04/30/20 2038 05/01/20 0528 05/01/20 2000 05/02/20 0444 05/02/20 0444 05/02/20 1410 05/02/20 1856 05/03/20 0349 05/03/20 1714 05/03/20 1757 05/03/20 2206 05/04/20 0500  WBC   < >  --  3.8*  --  3.4*  --   --   --  5.2  --   --  6.8 8.1  CREATININE   < >  --  1.47*   < > 1.54*   < > 1.55* 1.59* 1.69* 1.80*  --   --  1.55*  LATICACIDVEN  --  1.8  --   --   --   --   --   --   --   --  5.6* 4.6* 1.9   < > = values in this interval not displayed.    Estimated Creatinine Clearance: 37.1 mL/min (A) (by C-G formula based on SCr of 1.55 mg/dL (H)).    No Known Allergies  Antimicrobials this admission: Vancomycin 12/1 >> Cefepime 12/1 >>   Microbiology results: sent  Arrie Senate, PharmD, BCPS, [facility] Clinical Pharmacist [phone] Please check AMION for all [place] numbers 05/04/2020

## 2020-05-04 NOTE — Progress Notes (Signed)
Patient's foley catheter had been leaking since last night. Night shift RN attempted to trouble shoot in various ways. This RN noted catheter still leaking this AM. No clots or sediment noted in urine. Bladder scan 0 mls. Removed catheter and re-inserted another 16Fr catheter. Immediately after insertion, patient urinated around catheter. Foley removed at this time and external catheter placed. CCM aware. Will continue to monitor.  Joellen Jersey, RN

## 2020-05-04 NOTE — Progress Notes (Signed)
ANTICOAGULATION CONSULT NOTE  Pharmacy Consult for heparin Indication: Impella and hx PE  No Known Allergies  Patient Measurements: Height: 6' (182.9 cm) Weight: 62.8 kg (138 lb 7.2 oz) IBW/kg (Calculated) : 77.6 Heparin Dosing Weight: 60kg  Vital Signs: Temp: 99.9 F (37.7 C) (12/01 1400) Temp Source: Core (12/01 1200) BP: 118/83 (12/01 1628) Pulse Rate: 108 (12/01 1628)  Labs: Recent Labs    05/03/20 0349 05/03/20 1043 05/03/20 1334 05/03/20 1334 05/03/20 1714 05/03/20 2206 05/04/20 0200 05/04/20 0205 05/04/20 0500 05/04/20 1219  HGB 11.6*   < > 13.6   < >  --  11.0*  --   --  10.8*  --   HCT 40.7   < > 40.0  --   --  37.5*  --   --  35.5*  --   PLT 190  --   --   --   --  181  --   --  171  --   APTT  --   --   --   --  81*  --  40*  --   --  60*  HEPARINUNFRC  --   --   --   --  >2.20*  --   --  >2.20*  --   --   CREATININE 1.69*  --   --   --  1.80*  --   --   --  1.55*  --    < > = values in this interval not displayed.    Estimated Creatinine Clearance: 37.1 mL/min (A) (by C-G formula based on SCr of 1.55 mg/dL (H)).  Assessment: 68 yoM with hx recent PE (03/2020) on apixaban PTA admitted with ADHF now s/p Impella placement. Heparin infusing via the purge solution and systemic heparin  Now s/p cath lab - sheath removed ~ 1630 - to resume heparin 8 hrs after  Goal of Therapy:  Heparin level 0.3-0.5 units/ml aPTT 66-85 seconds Monitor platelets by anticoagulation protocol: Yes   Plan:  Impella via purge solution (50 units/ml) continues Resume Heparin 200 units/hr systemic at [facility] labs at [place], PharmD, BCPS, [place] Pharmacist [phone]  Please check AMION for all [place] numbers  05/04/2020 4:49 PM

## 2020-05-04 NOTE — Anesthesia Postprocedure Evaluation (Signed)
Anesthesia Post Note  Patient: Cody Arellano  Procedure(s) Performed: VIDEO BRONCHOSCOPY WITHOUT FLUORO (N/A ) BRONCHIAL WASHINGS     Patient location during evaluation: PACU Anesthesia Type: General Level of consciousness: awake and alert Pain management: pain level controlled Vital Signs Assessment: post-procedure vital signs reviewed and stable Respiratory status: spontaneous breathing, nonlabored ventilation, respiratory function stable and patient connected to nasal cannula oxygen Cardiovascular status: blood pressure returned to baseline and stable Postop Assessment: no apparent nausea or vomiting Anesthetic complications: no   No complications documented.  Last Vitals:  Vitals:   05/04/20 0500 05/04/20 0600  BP:    Pulse: (!) 129 75  Resp: 18 18  Temp: 37.8 C 37.7 C  SpO2: 99% 100%    Last Pain:  Vitals:   05/04/20 0400  TempSrc:   PainSc: 0-No pain                 Donley Harland S

## 2020-05-05 ENCOUNTER — Inpatient Hospital Stay (HOSPITAL_COMMUNITY): Payer: No Typology Code available for payment source

## 2020-05-05 ENCOUNTER — Encounter (HOSPITAL_COMMUNITY): Payer: Self-pay | Admitting: Cardiology

## 2020-05-05 DIAGNOSIS — J189 Pneumonia, unspecified organism: Secondary | ICD-10-CM

## 2020-05-05 DIAGNOSIS — Z95811 Presence of heart assist device: Secondary | ICD-10-CM

## 2020-05-05 DIAGNOSIS — J9601 Acute respiratory failure with hypoxia: Secondary | ICD-10-CM | POA: Diagnosis not present

## 2020-05-05 DIAGNOSIS — Z9911 Dependence on respirator [ventilator] status: Secondary | ICD-10-CM | POA: Diagnosis not present

## 2020-05-05 DIAGNOSIS — J810 Acute pulmonary edema: Secondary | ICD-10-CM

## 2020-05-05 DIAGNOSIS — I5023 Acute on chronic systolic (congestive) heart failure: Secondary | ICD-10-CM | POA: Diagnosis not present

## 2020-05-05 DIAGNOSIS — J90 Pleural effusion, not elsewhere classified: Secondary | ICD-10-CM | POA: Diagnosis not present

## 2020-05-05 DIAGNOSIS — R579 Shock, unspecified: Secondary | ICD-10-CM

## 2020-05-05 LAB — POCT I-STAT 7, (LYTES, BLD GAS, ICA,H+H)
Acid-Base Excess: 3 mmol/L — ABNORMAL HIGH (ref 0.0–2.0)
Bicarbonate: 26.6 mmol/L (ref 20.0–28.0)
Calcium, Ion: 1.18 mmol/L (ref 1.15–1.40)
HCT: 29 % — ABNORMAL LOW (ref 39.0–52.0)
Hemoglobin: 9.9 g/dL — ABNORMAL LOW (ref 13.0–17.0)
O2 Saturation: 99 %
Patient temperature: 37
Potassium: 3.8 mmol/L (ref 3.5–5.1)
Sodium: 135 mmol/L (ref 135–145)
TCO2: 28 mmol/L (ref 22–32)
pCO2 arterial: 36.3 mmHg (ref 32.0–48.0)
pH, Arterial: 7.473 — ABNORMAL HIGH (ref 7.350–7.450)
pO2, Arterial: 129 mmHg — ABNORMAL HIGH (ref 83.0–108.0)

## 2020-05-05 LAB — HEPARIN LEVEL (UNFRACTIONATED): Heparin Unfractionated: >2.2 IU/mL — ABNORMAL HIGH (ref 0.30–0.70)

## 2020-05-05 LAB — COMPREHENSIVE METABOLIC PANEL
ALT: 25 U/L (ref 0–44)
AST: 33 U/L (ref 15–41)
Albumin: 2.1 g/dL — ABNORMAL LOW (ref 3.5–5.0)
Alkaline Phosphatase: 44 U/L (ref 38–126)
Anion gap: 11 (ref 5–15)
BUN: 15 mg/dL (ref 8–23)
CO2: 25 mmol/L (ref 22–32)
Calcium: 8.4 mg/dL — ABNORMAL LOW (ref 8.9–10.3)
Chloride: 99 mmol/L (ref 98–111)
Creatinine, Ser: 1.25 mg/dL — ABNORMAL HIGH (ref 0.61–1.24)
GFR, Estimated: >60 mL/min (ref >60)
Glucose, Bld: 94 mg/dL (ref 70–99)
Potassium: 3.9 mmol/L (ref 3.5–5.1)
Sodium: 135 mmol/L (ref 135–145)
Total Bilirubin: 1.5 mg/dL — ABNORMAL HIGH (ref 0.3–1.2)
Total Protein: 5.2 g/dL — ABNORMAL LOW (ref 6.5–8.1)

## 2020-05-05 LAB — LACTATE DEHYDROGENASE: LDH: 275 U/L — ABNORMAL HIGH (ref 98–192)

## 2020-05-05 LAB — CBC
HCT: 30.4 % — ABNORMAL LOW (ref 39.0–52.0)
Hemoglobin: 9.4 g/dL — ABNORMAL LOW (ref 13.0–17.0)
MCH: 26.6 pg (ref 26.0–34.0)
MCHC: 30.9 g/dL (ref 30.0–36.0)
MCV: 86.1 fL (ref 80.0–100.0)
Platelets: 122 10*3/uL — ABNORMAL LOW (ref 150–400)
RBC: 3.53 MIL/uL — ABNORMAL LOW (ref 4.22–5.81)
RDW: 17.9 % — ABNORMAL HIGH (ref 11.5–15.5)
WBC: 8.8 10*3/uL (ref 4.0–10.5)
nRBC: 0 % (ref 0.0–0.2)

## 2020-05-05 LAB — MAGNESIUM: Magnesium: 2.2 mg/dL (ref 1.7–2.4)

## 2020-05-05 LAB — COOXEMETRY PANEL
Carboxyhemoglobin: 1.5 % (ref 0.5–1.5)
Methemoglobin: 0.9 % (ref 0.0–1.5)
O2 Saturation: 70.9 %
Total hemoglobin: 9.9 g/dL — ABNORMAL LOW (ref 12.0–16.0)

## 2020-05-05 LAB — PHOSPHORUS: Phosphorus: 3 mg/dL (ref 2.5–4.6)

## 2020-05-05 LAB — APTT
aPTT: 66 seconds — ABNORMAL HIGH (ref 24–36)
aPTT: 68 seconds — ABNORMAL HIGH (ref 24–36)
aPTT: 68 seconds — ABNORMAL HIGH (ref 24–36)

## 2020-05-05 MED ORDER — ACETAMINOPHEN 160 MG/5ML PO SOLN
650.0000 mg | ORAL | Status: DC | PRN
  Administered 2020-05-14: 650 mg via ORAL
  Filled 2020-05-05 (×2): qty 20.3

## 2020-05-05 MED ORDER — ADULT MULTIVITAMIN W/MINERALS CH
1.0000 | ORAL_TABLET | Freq: Every day | ORAL | Status: DC
  Administered 2020-05-06 – 2020-05-23 (×17): 1 via ORAL
  Filled 2020-05-05 (×17): qty 1

## 2020-05-05 MED ORDER — FENTANYL CITRATE (PF) 100 MCG/2ML IJ SOLN
12.5000 ug | INTRAMUSCULAR | Status: DC | PRN
  Administered 2020-05-05 – 2020-05-17 (×17): 12.5 ug via INTRAVENOUS
  Filled 2020-05-05 (×17): qty 2

## 2020-05-05 MED ORDER — ORAL CARE MOUTH RINSE
15.0000 mL | Freq: Two times a day (BID) | OROMUCOSAL | Status: DC
  Administered 2020-05-05 – 2020-05-16 (×15): 15 mL via OROMUCOSAL

## 2020-05-05 MED ORDER — OXYCODONE HCL 5 MG PO TABS
5.0000 mg | ORAL_TABLET | Freq: Four times a day (QID) | ORAL | Status: DC | PRN
  Administered 2020-05-06 – 2020-05-18 (×18): 5 mg via ORAL
  Filled 2020-05-05 (×18): qty 1

## 2020-05-05 MED ORDER — DIGOXIN 125 MCG PO TABS
0.1250 mg | ORAL_TABLET | Freq: Every day | ORAL | Status: DC
  Administered 2020-05-06 – 2020-05-09 (×4): 0.125 mg via ORAL
  Filled 2020-05-05 (×5): qty 1

## 2020-05-05 MED ORDER — POLYETHYLENE GLYCOL 3350 17 G PO PACK
17.0000 g | PACK | Freq: Every day | ORAL | Status: DC | PRN
  Administered 2020-05-12: 17 g via ORAL
  Filled 2020-05-05: qty 1

## 2020-05-05 MED ORDER — ROSUVASTATIN CALCIUM 20 MG PO TABS
20.0000 mg | ORAL_TABLET | Freq: Every day | ORAL | Status: DC
  Administered 2020-05-06 – 2020-05-23 (×17): 20 mg via ORAL
  Filled 2020-05-05 (×17): qty 1

## 2020-05-05 MED ORDER — DOCUSATE SODIUM 50 MG/5ML PO LIQD
100.0000 mg | Freq: Two times a day (BID) | ORAL | Status: DC
  Administered 2020-05-06 – 2020-05-23 (×17): 100 mg via ORAL
  Filled 2020-05-05 (×16): qty 10

## 2020-05-05 MED ORDER — CHLORHEXIDINE GLUCONATE 0.12 % MT SOLN
15.0000 mL | Freq: Two times a day (BID) | OROMUCOSAL | Status: DC
  Administered 2020-05-05 – 2020-05-17 (×22): 15 mL via OROMUCOSAL
  Filled 2020-05-05 (×16): qty 15

## 2020-05-05 MED ORDER — ASPIRIN 81 MG PO CHEW
81.0000 mg | CHEWABLE_TABLET | Freq: Every day | ORAL | Status: DC
  Administered 2020-05-06 – 2020-05-15 (×10): 81 mg via ORAL
  Filled 2020-05-05 (×10): qty 1

## 2020-05-05 MED FILL — Heparin Sod (Porcine)-NaCl IV Soln 1000 Unit/500ML-0.9%: INTRAVENOUS | Qty: 500 | Status: AC

## 2020-05-05 NOTE — Progress Notes (Signed)
Patient ID: Cody Arellano, male   DOB: 05/11/1946, 74 y.o.   MRN: [id number] Pat    Advanced Heart Failure Rounding Note  PCP-Cardiologist: Mertie Moores, MD   Subjective:    Impella 5.5 placed on 11/30 with cardiogenic shock.   Currently on milrinone 0.25 + NE 4. MAP around 80.   Creatinine 1.59 => 1.69 => 1.8 => 1.55 => 1.25.  Diuretics on hold and has had fluid boluses with CVP low.   CXR with possible PNA right lung.  Tm 99.9.  He is on vancomycin/cefepime.   He is on amiodarone gtt 30 mg/hr with HR in 100s,  atrial flutter.   - Echo: EF 20-25% with septal akinesis, mid to apical inferior akinesis, apical anterior and apex akinesis.  Moderately dilated and dysfunctional RV.  Moderate TR.  Severe MR.  Bicuspid aortic valve with no stenosis, mild aortic insufficiency.  - LHC:  1. Dominant LCx, no significant disease in this vessel.  2. 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  The LIMA-LAD is also present.  3. The SVG-ramus has been occluded.  There is severe and diffuse in-stent restenosis in the proximal ramus.   Impella 5.5 P8  Flow 4.5, no alarms LDH 319 => 275  [place] numbers: CVP 2-3 PA 28/10 CI 2.4 Co-ox 71%   Objective:   Weight Range: 62.8 kg Body mass index is 18.78 kg/m.   Vital Signs:   Temp:  [98.8 F (37.1 C)-100 F (37.8 C)] 98.8 F (37.1 C) (12/02 0700) Pulse Rate:  [47-237] 107 (12/02 0645) Resp:  [15-23] 18 (12/02 0700) BP: (77-134)/(64-95) 118/83 (12/01 1628) SpO2:  [97 %-100 %] 100 % (12/02 0645) Arterial Line BP: (64-115)/(57-85) 95/75 (12/02 0700) FiO2 (%):  [30 %] 30 % (12/02 0324) Last BM Date: 05/01/20  Weight change: Filed Weights   05/01/20 2221 05/03/20 0500 05/04/20 0600  Weight: 59.4 kg 60.1 kg 62.8 kg    Intake/Output:   Intake/Output Summary (Last 24 hours) at 05/05/2020 0741 Last data filed at 05/05/2020 2992 Gross per 24 hour  Intake 3446.19 ml  Output 1335 ml  Net 2111.19 ml      Physical Exam     General: NAD, awake on vent.  Neck: No JVD, no thyromegaly or thyroid nodule.  Lungs: Decreased right base. CV: Lateral PMI.  Heart mildly tachy, regular S1/S2, no S3/S4, 2/6 HSM apex.  No peripheral edema.   Abdomen: Soft, nontender, no hepatosplenomegaly, no distention.  Skin: Intact without lesions or rashes.  Neurologic: Alert and oriented x 3.  Psych: Normal affect. Extremities: No clubbing or cyanosis.  HEENT: Normal.    Telemetry   Atrial flutter (100s).  Personally reviewed.   Labs    CBC Recent Labs    05/04/20 0500 05/05/20 0427  WBC 8.1 8.8  HGB 10.8* 9.4*  HCT 35.5* 30.4*  MCV 88.3 86.1  PLT 171 426*   Basic Metabolic Panel Recent Labs    05/04/20 1756 05/05/20 0419  NA 133* 135  K 3.8 3.9  CL 99 99  CO2 22 25  GLUCOSE 91 94  BUN 15 15  CREATININE 1.33* 1.25*  CALCIUM 8.0* 8.4*  MG 2.2 2.2  PHOS 3.6 3.0   Liver Function Tests Recent Labs    05/04/20 1756 05/05/20 0419  AST 41 33  ALT 28 25  ALKPHOS 50 44  BILITOT 1.5* 1.5*  PROT 5.4* 5.2*  ALBUMIN 2.4* 2.1*   No results for input(s): LIPASE, AMYLASE in the last  72 hours. Cardiac Enzymes No results for input(s): CKTOTAL, CKMB, CKMBINDEX, TROPONINI in the last 72 hours.  BNP: BNP (last 3 results) Recent Labs    04/06/20 2134 04/26/20 1319 04/30/20 0248  BNP 1,677.6* 1,824.4* 1,685.5*    ProBNP (last 3 results) No results for input(s): PROBNP in the last 8760 hours.   D-Dimer No results for input(s): DDIMER in the last 72 hours. Hemoglobin A1C No results for input(s): HGBA1C in the last 72 hours. Fasting Lipid Panel Recent Labs    05/03/20 1340  TRIG 65   Thyroid Function Tests Recent Labs    05/03/20 0349  TSH 4.438    Other results:   Imaging    CARDIAC CATHETERIZATION  Result Date: 05/04/2020 1. Dominant LCx, no significant disease in this vessel. 2. 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  The LIMA-LAD is also present. 3. The  SVG-ramus has been occluded.  There is severe and diffuse in-stent restenosis in the proximal ramus. Could potentially intervene on the ramus, but this would be a complex procedure and I do not think this explains the profound fall in his EF and cardiogenic shock.  Would defer intervention at this time, next step will be DCCV.   ECHOCARDIOGRAM LIMITED  Result Date: 05/04/2020    ECHOCARDIOGRAM LIMITED REPORT   Patient Name:   HAVOC SANLUIS Seppala Date of Exam: 05/04/2020 Medical Rec #:  [id number]    Height:       72.0 in Accession #:    [id number]   Weight:       138.4 lb Date of Birth:  Sep 29, 1945    BSA:          1.822 m Patient Age:    41 years     BP:           75/63 mmHg Patient Gender: M            HR:           111 bpm. Exam Location:  Inpatient Procedure: Limited Echo and Limited Color Doppler Indications:    I50.23 Acute on chronic systolic (congestive) heart failure  History:        Patient has prior history of Echocardiogram examinations, most                 recent 05/03/2020. Previous Myocardial Infarction and CAD, Prior                 CABG, Signs/Symptoms:Dyspnea; Risk Factors:Diabetes and                 Dyslipidemia. Lupus.  Sonographer:    Jonelle Sidle Dance Referring Phys: [place]  1. Left ventricular ejection fraction, by estimation, is 20 to 25%. The left ventricle has severely decreased function. The left ventricle demonstrates global hypokinesis.  2. Right ventricular systolic function is moderately reduced. The right ventricular size is mildly enlarged.  3. The mitral valve is grossly normal.  4. The aortic valve was not well visualized. Comparison(s): Compared to prior studies, interval change in LV Support Device and improvement in tricuspid regurgitation. FINDINGS  Left Ventricle: LV support device has moved to 4.0 cm from aortic annulus. Left ventricular ejection fraction, by estimation, is 20 to 25%. The left ventricle has severely decreased function. The left  ventricle demonstrates global hypokinesis. Right Ventricle: The right ventricular size is mildly enlarged. Right vetricular wall thickness was not well visualized. Right ventricular systolic function is moderately reduced. Right Atrium: Right atrial  size was normal in size. Mitral Valve: The mitral valve is grossly normal. Tricuspid Valve: The tricuspid valve is grossly normal. Tricuspid valve regurgitation is not demonstrated. Aortic Valve: The aortic valve was not well visualized. Additional Comments: A venous catheter is visualized. RIGHT VENTRICLE RV Basal diam:  2.80 cm RIGHT ATRIUM           Index RA Area:     13.60 cm RA Volume:   34.80 ml  19.10 ml/m Rudean Haskell MD Electronically signed by Rudean Haskell MD Signature Date/Time: 05/04/2020/12:36:49 PM    Final      Medications:     Scheduled Medications: . aspirin  81 mg Per Tube Daily  . chlorhexidine gluconate (MEDLINE KIT)  15 mL Mouth Rinse BID  . Chlorhexidine Gluconate Cloth  6 each Topical Daily  . digoxin  0.125 mg Per Tube Daily  . docusate  100 mg Per Tube BID  . feeding supplement  237 mL Per Tube TID BM  . mouth rinse  15 mL Mouth Rinse 10 times per day  . multivitamin with minerals  1 tablet Per Tube Daily  . rosuvastatin  20 mg Per Tube Daily  . sodium chloride flush  10-40 mL Intracatheter Q12H  . sodium chloride flush  3 mL Intravenous Q12H    Infusions: . sodium chloride Stopped (05/04/20 2159)  . sodium chloride    . sodium chloride    . amiodarone 30 mg/hr (05/05/20 3846)  . ceFEPime (MAXIPIME) IV Stopped (05/04/20 2229)  . heparin 200 Units/hr (05/05/20 6599)  . impella catheter heparin 50 unit/mL in dextrose 5%    . lactated ringers 20 mL/hr at 05/05/20 3570  . milrinone 0.25 mcg/kg/min (05/05/20 1779)  . norepinephrine (LEVOPHED) Adult infusion 6 mcg/min (05/05/20 3903)  . propofol (DIPRIVAN) infusion 15 mcg/kg/min (05/05/20 0092)  . vancomycin Stopped (05/04/20 1045)    PRN  Medications: Place/Maintain arterial line **AND** sodium chloride, sodium chloride, acetaminophen (TYLENOL) oral liquid 160 mg/5 mL, bisacodyl, lidocaine, ondansetron (ZOFRAN) IV, oxyCODONE, polyethylene glycol, sodium chloride flush, sodium chloride flush   Assessment/Plan   1. 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  Echo this admission looks worse than 10/21 with EF 20-25% with septal akinesis, mid to apical inferior akinesis, apical anterior and apex akinesis, moderately dilated and dysfunctional RV, moderate TR, severe MR, bicuspid aortic valve with no stenosis, mild aortic insufficiency. Progressive/end-stage CHF worsened by onset of atrial fibrillation. Cardiogenic shock 11/30 with co-ox down to 30%, Impella 5.5 placed to allow time to stabilize and consider further steps.  Currently with CI 2.4 and co-ox 71% on P8 Impella with no alarms.  He is on milrinone 0.25 and NE 4. CVP 2-3.  - Limited echo for Impella position. Urine clear, LDH 275.  - No Lasix today, will not bolus for now as he will be fluid positive without diuretic.  - Will aim to titrate off NE today.   - Decrease milrinone to 0.125, will try to stop later today.  - Continue digoxin 0.125 with improving creatinine.  - I am very concerned about this situation, low cardiac output by co-ox in setting of AF/RVR with severe MR and biventricular failure.  I am concerned for end-stage CHF, he is thin/cachectic.  He now has Impella 5.5 to buy [place] some time to stabilize him and get him out of atrial fibrillation by limiting vasoactive meds. He is not currently an LVAD candidate, but may be able to stabilize to cardiovert and potentially eventually Mitraclip.  2. CAD: Prior history of MI with LAD and ramus PCI. Admitted May 2021 with anterior STEMI. LHC with occluded ostial LAD (in-stent), 90% ostial ramus, 60-70% proximal LCx, nondominant RCA. PTCA to LAD and ramus to restore flow, then CABG with LIMA-LAD  and SVG-ramus.  No chest pain, doubt ACS.  Fall in EF from 10/21 to 11/21, but he did not present with ACS.  [place] 12/1 showed patient LIMA-LAD, patent native LAD, patent dominant LCx.  The SVG-ramus is occluded with severe diffuse in-stent restenosis in proximal ramus.  Ramus not intervened upon as this would be a complex intervention and unlikely to markedly improve EF.  - Continue ASA 81 and statin.    3. AKI: Creatinine now trending down with improved cardiac output.  4. Right pleural effusion: S/p thoracentesis, transudative (CHF).  5. Atrial fibrillation/flutter with RVR: Mild RVR this morning, he is on amiodarone gtt 30 mg/hr and heparin gtt. In atrial flutter.  - Continue amiodarone gtt for rate control.  - If we can get him off milrinone and NE today, will plan for TEE-guided DCCV tomorrow.  - With improving creatinine, added digoxin for rate control.  6. Mitral regurgitation: Severe, ?infarct-related MR. - ?Eventual Mitraclip candidate if he can be stabilized.  - Reassess via TEE pre-DCCV.  7. H/o PE: In 10/21.  Keep anticoagulated.  8. Bicuspid aortic valve: Mild AI, no AS.  9. SLE: H/o pericarditis.  On Imuran and hydroxychloroquine on. Holding hydroxychloroquine for now with risk for QT prolongation.  10. Pulmonary nodules/emphysema: CCM following, s/p bronch/BAL (no growth).  11. ID: Concern for right-sided PNA, ?aspiration.  Tm 99.2, WBCs normal. PCT mildly elevated 0.61.  - Cultures NGTD.  - Treat empirically with vancomycin/cefepime.  12. Thrombocytopenia: Mild drop in plts to 122.  May be due to critical illness, low grade hemolysis with Impella.  Will need to follow closely.  Discussed with pharmacist, will watch one more day before sending HIT and switching to bivalirudin.    CRITICAL CARE Performed by: Loralie Champagne  Total critical care time: 40 minutes  Critical care time was exclusive of separately billable procedures and treating other patients.  Critical care was  necessary to treat or prevent imminent or life-threatening deterioration.  Critical care was time spent personally by me on the following activities: development of treatment plan with patient and/or surrogate as well as nursing, discussions with consultants, evaluation of patient's response to treatment, examination of patient, obtaining history from patient or surrogate, ordering and performing treatments and interventions, ordering and review of laboratory studies, ordering and review of radiographic studies, pulse oximetry and re-evaluation of patient's condition.    Length of Stay: 9  Loralie Champagne, MD  05/05/2020, 7:41 AM  Advanced Heart Failure Team Pager [phone] (M-F; 7a - 4p)  Please contact [facility] for night-coverage after hours (4p -7a ) and weekends on [email or web address]

## 2020-05-05 NOTE — Plan of Care (Signed)
  Problem: Clinical Measurements: Goal: Ability to maintain clinical measurements within normal limits will improve Outcome: Progressing Goal: Respiratory complications will improve Outcome: Progressing Note: Plan to extubate today.   Problem: Activity: Goal: Risk for activity intolerance will decrease Outcome: Progressing Note: PT/OT consulted today.   Problem: Nutrition: Goal: Adequate nutrition will be maintained Outcome: Progressing Note: Dietitian consulted today.    Problem: Coping: Goal: Level of anxiety will decrease Outcome: Progressing   Problem: Elimination: Goal: Will not experience complications related to urinary retention Outcome: Progressing   Problem: Pain Managment: Goal: General experience of comfort will improve Outcome: Progressing   Problem: Skin Integrity: Goal: Risk for impaired skin integrity will decrease Outcome: Progressing   Problem: Cardiovascular: Goal: Ability to achieve and maintain adequate cardiovascular perfusion will improve Outcome: Progressing Goal: Vascular access site(s) Level 0-1 will be maintained Outcome: Progressing

## 2020-05-05 NOTE — Progress Notes (Signed)
Dr. Haroldine Laws aware of patient's urine being pink tinged.   Joellen Jersey, RN

## 2020-05-05 NOTE — Progress Notes (Signed)
Nutrition Follow-up  DOCUMENTATION CODES:   Underweight, Severe malnutrition in context of chronic illness  INTERVENTION:   Once Cortrak placed:  -Vital 1.5 @ 20 ml/hr -Increase by 10 ml Q4 hours to goal rate of 55 ml/hr (1320 ml) -45 ml ProSource TID  Provides: 2100 kcals, 122 grams protein, 1008 ml free water.   NUTRITION DIAGNOSIS:   Severe Malnutrition related to chronic illness (CHF) as evidenced by severe muscle depletion, severe fat depletion, percent weight loss.  Ongoing  GOAL:   Patient will meet greater than or equal to 90% of their needs  Not meeting   MONITOR:   PO intake, Supplement acceptance, Skin, Weight trends, Labs, I & O's  REASON FOR ASSESSMENT:   Consult Assessment of nutrition requirement/status, Poor PO  ASSESSMENT:   74 y.o. male with medical history significant for lupus, interstitial lung disease, coronary artery disease, chronic systolic CHF, paroxysmal atrial fibrillation presents with shortness of breath. Pt found to have large right-sided pleural effusion and small left-sided pleural effusion. Chest x-ray showed cardiomegaly with pulmonary venous congestion and bilateral interstitial prominence most consistent with interstitial edema.  11/26- s/p Flexible video fiberoptic bronchoscopy and biopsies 11/30- s/p impella   Pt discussed during ICU rounds and with RN.   Extubated to BiPAP this am. Off pressors. DCCV likely tomorrow. Swallow evaluation and Cortrak ordered for tomorrow. Patient would benefit from Cortrak with EN regardless of diet advancement given severity of malnutrition. RD to leave recommendations.   Admission weight: 66.7 kg  Current weight: 62.8 kg   UOP: 1335 ml x 24 hrs   Drips: LR @ 20 ml/hr  Medications: colace Labs: Cr 1.25-trending down CBG 80-323  Diet Order:   Diet Order    None      EDUCATION NEEDS:   Education needs have been addressed  Skin:  Skin Assessment: Skin Integrity Issues: Skin  Integrity Issues:: Incisions Incisions: R chest  Last BM:  11/28  Height:   Ht Readings from Last 1 Encounters:  04/29/20 6' (1.829 m)    Weight:   Wt Readings from Last 1 Encounters:  05/04/20 62.8 kg   BMI:  Body mass index is 18.78 kg/m.  Estimated Nutritional Needs:   Kcal:  2100-2300  Protein:  115-130 grams  Fluid:  > 2 L   Mariana Single RD, LDN Clinical Nutrition Pager listed in [place]

## 2020-05-05 NOTE — Progress Notes (Signed)
  Echocardiogram 2D Echocardiogram has been performed.  Cody Arellano Cody Arellano 05/05/2020, 8:45 AM

## 2020-05-05 NOTE — Plan of Care (Signed)
°  Problem: Clinical Measurements: Goal: Cardiovascular complication will be avoided Outcome: Progressing   Problem: Activity: Goal: Ability to return to baseline activity level will improve Outcome: Not Progressing

## 2020-05-05 NOTE — Procedures (Signed)
Extubation Procedure Note  Patient Details:   Name: Cody Arellano DOB: [birth year] MRN: [id number]   Airway Documentation:    Vent end date: 05/05/20 Vent end time: 1020   Evaluation  O2 sats: stable throughout Complications: No apparent complications Patient did tolerate procedure well. Bilateral Breath Sounds: Clear, Diminished   Yes   Patient was extubated to BIPAP per MD order without any complications, dyspnea or stridor noted. NIF: -22, VC: 1.2L, positive cuff leak prior to extubation.   Ixchel Duck L 05/05/2020, 10:20 AM

## 2020-05-05 NOTE — Progress Notes (Addendum)
ANTICOAGULATION CONSULT NOTE  Pharmacy Consult for heparin Indication: Impella and hx PE  No Known Allergies  Patient Measurements: Height: 6' (182.9 cm) Weight: 62.8 kg (138 lb 7.2 oz) IBW/kg (Calculated) : 77.6 Heparin Dosing Weight: 60kg  Vital Signs: Temp: 98.8 F (37.1 C) (12/02 0700) Temp Source: Core (12/01 2000) Pulse Rate: 107 (12/02 0645)  Labs: Recent Labs    05/03/20 1714 05/03/20 2206 05/03/20 2206 05/04/20 0200 05/04/20 0205 05/04/20 0500 05/04/20 1219 05/04/20 1756 05/05/20 0419 05/05/20 0427  HGB  --  11.0*   < >  --   --  10.8*  --   --   --  9.4*  HCT  --  37.5*  --   --   --  35.5*  --   --   --  30.4*  PLT  --  181  --   --   --  171  --   --   --  122*  APTT   < >  --   --  40*  --   --  60*  --  66*  --   HEPARINUNFRC   < >  --   --   --  >2.20*  --   --  >2.20* >2.20*  --   CREATININE  --   --    < >  --   --  1.55*  --  1.33* 1.25*  --    < > = values in this interval not displayed.    Estimated Creatinine Clearance: 46.1 mL/min (A) (by C-G formula based on SCr of 1.25 mg/dL (H)).   Medical History: Past Medical History:  Diagnosis Date  . Anxiety   . CHF (congestive heart failure) ([place])   . Chronic tension headache   . Colon polyps   . Coronary artery disease 2008/2009   MI with PCI x 2, then PCI x 1 in 2009  . Depression   . Dyslipidemia   . Dysrhythmia    atrial fibrillation  . Hyperlipidemia   . Hypertension   . Lupus [facility])    sees Dr Trudie Reed  . Lupus disease of the lung   . 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 Myocardial infarction ([place]) 2008  . 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 Shortness of breath     Assessment: 39 yoM with hx recent PE (03/2020) on apixaban PTA admitted with ADHF now s/p Impella placement. Heparin infusing via the purge solution and systemically at 200 units/h. Purge rate stable ~13.5 ml/hr, H/H down slightly. Heparin level falsely elevated by  DOAC use, aPTT is therapeutic at 66 seconds.  Goal of Therapy:  Heparin level 0.3-0.5 units/ml aPTT 66-85 seconds Monitor platelets by anticoagulation protocol: Yes   Plan:  Impella via purge solution (50 units/ml) continues Continue heparin 200 units systemic for now Will begin q12h coag checks   ADDENDUM: repeat aPTT is 68 seconds, impella flow and purge infusion volume stable. Plan as above.   Arrie Senate, PharmD, BCPS, [facility] Clinical Pharmacist [phone] Please check AMION for all [place] numbers 05/05/2020

## 2020-05-05 NOTE — Progress Notes (Signed)
NAME:  Cody Arellano, MRN:  [id number], DOB:  Nov 10, 1945, LOS: 9 ADMISSION DATE:  04/26/2020, CONSULTATION DATE:  05/05/20 REFERRING MD:  Julian Hy, DO, CHIEF COMPLAINT:  DOE   Brief History   Patient is a 74 year old male I was asked to evaluate at bedside due to hypotension felt secondary to cardiogenic shock.  He currently is being evaluated by pulmonary critical care recurrent effusion and pulmonary nodules.  History of present illness   Patient was admitted with pleural effusion cardiomyopathy with ejection fraction of 30%, chronic A. fib and SLE on Imuran and Plaquenil.  Patient developed hypotension worse than baseline this evening.  On my evaluation the patient is awake alert interactive says he feels weak and tired.  He does not complain of dyspnea.  O2 saturation in the high 90s on room air.  He is not breathing rapidly nor using accessory muscles.  Chest x-ray shows increased interstitial markings bilaterally blood pressure is 92/70.  Hemoglobin is 11 and creatinine is stable at 1.49, platelet count 180.  Plan is for transfer to the ICU and initiation of milrinone.  BNP is 1600.  Past Medical History  CHF w/ reduced EF, mitral regurg, CAD, Afib, SLE  Significant Hospital Events   Admitted 11/23 thora 11/23  Consults:  PCCM VIR  Procedures:  Inocencio Homes 11/24 Bronchoscopy impella 11/30 Heart catherization 12/1  Significant Diagnostic Tests:  CT chest 11/23 - R>L bilateral pleural effusions, stable nodules largest 2 in LUL, pulmonary edema, bullous emphysematous changes scattered throughout  Pleural fluid transudate 11/24  Micro Data:  Pleural fluid CX 11/24 - pending BAL CX 11/26 - Neg BAL AF smear - neg BAL AFB -pending BAL Fungal - no fungus BAL PJP -negative BAL Asp antigen 0.09 Antimicrobials:  Ceftriaxone 11/27> 11/28 Azithro 11/27> 11/28 Cefepime 12/1> Vancomycin 12/1>  Interim history/subjective:  Denies complaints this morning.  Objective   Blood  pressure 110/76, pulse (!) 101, temperature 98.6 F (37 C), resp. rate 12, height 6' (1.829 m), weight 62.8 kg, SpO2 99 %. PAP: (17-33)/(0-18) 27/9 CVP:  [1 mmHg-8 mmHg] 3 mmHg PCWP:  [7 mmHg-16 mmHg] 7 mmHg CO:  [3.5 L/min-4.4 L/min] 4.1 L/min CI:  [1.9 L/min/m2-2.4 L/min/m2] 2.3 L/min/m2  Vent Mode: PSV;CPAP FiO2 (%):  [30 %] 30 % Set Rate:  [18 bmp] 18 bmp Vt Set:  [620 mL] 620 mL PEEP:  [5 cmH20] 5 cmH20 Pressure Support:  [5 cmH20] 5 cmH20 Plateau Pressure:  [13 cmH20-16 cmH20] 13 cmH20   Intake/Output Summary (Last 24 hours) at 05/05/2020 0820 Last data filed at 05/05/2020 0800 Gross per 24 hour  Intake 3452.44 ml  Output 1510 ml  Net 1942.44 ml   Filed Weights   05/01/20 2221 05/03/20 0500 05/04/20 0600  Weight: 59.4 kg 60.1 kg 62.8 kg   Physical Exam: General: frail elderly man laying in bed in NAD HEENT: temporal wasting, endotracheal tube in place Respiratory: Breathing comfortably on SBT, 5/5.  Clear to auscultation bilaterally. Cardiovascular: regular rate and rhythm. Rub over left lower sternal border. Extremities: Minimal muscle mass, no clubbing or cyanosis. Neuro: RASS equals 0, following commands, able to lift his head off the pillow. Skin: No rashes  CXR personally reviewed-proving bilateral pulmonary edema, persistent right lower lobe infiltrate.  Impella, endotracheal tube in appropriate position.  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 Bicuspid aortic valve Has incompletely revascularized CAD -Impella 5.5; routine daily hemolysis labs.  Appreciate cardiology's assistance.  Continue running P8. -Intolerant to guideline directed heart failure therapy due to shock-no beta-blocker, ACE inhibitor, ARB, or Entresto -Con't amiodarone -Weaning milrinone to  0.125 -Con't digoxin -Continue norepinephrine as required to maintain MAP greater than 65; wean as tolerated -No diuresis today -Eventually will need DCCV when more compensated. Hopefully tomorrow. Echo today to re-evalate MR.  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  Plan to extubate to BiPAP. 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  Daily Goals Checklist  Pain/Anxiety/Delirium protocol (if indicated): None required VAP protocol (if indicated): Not intubated Blood pressure target: Titrate DVT prophylaxis: On apixaban Nutrition Status: High nutritional risk, dietary consult.  Trial of Megace GI prophylaxis: Not indicated Urinary catheter: Condom catheter Central lines: PICC line Glucose control: Currently euglycemic on no therapy Mobility/therapy needs: PT consult Code Status: Full code Family Communication: Updated patient Disposition: ICU  Labs   CBC: Recent Labs  Lab 05/02/20 0444  05/02/20 0444 05/03/20 0349 05/03/20 0349 05/03/20 1043 05/03/20 1334 05/03/20 2206 05/04/20 0500 05/05/20 0427  WBC 3.4*  --  5.2  --   --   --  6.8 8.1 8.8  HGB 11.2*   < > 11.6*   < > 15.0 13.6 11.0* 10.8* 9.4*  HCT 37.6*   < > 40.7   < > 44.0 40.0 37.5* 35.5* 30.4*  MCV 89.5  --  92.1  --   --   --  90.1 88.3 86.1  PLT 160  --  190  --   --   --  181 171 122*   < > = values in this interval not displayed.    Basic Metabolic Panel: Recent Labs  Lab 05/01/20 0528 05/01/20 2000 05/02/20 1410 05/02/20 1856 05/03/20 0349 05/03/20 1043 05/03/20 1334 05/03/20 1714 05/04/20 0500 05/04/20 1756 05/05/20 0419  NA 143   < > 137   < > 133*   < > 138 135 135 133* 135  K 2.9*   < > 3.0*   < > 4.4   < > 3.6 3.7 4.1 3.8 3.9  CL 100   < > 97*   < > 93*  --   --  97* 99 99 99  CO2 29   < > 29   < > 24  --   --  _0 GLUCOSE 100*   < > 191*   < > 323*  --   --  114* 88 91 94  BUN 15   < > 12   < > 17  --   --  _0 CREATININE 1.47*   < > 1.55*   < > 1.69*  --   --  1.80* 1.55* 1.33* 1.25*  CALCIUM 8.8*   < > 8.4*   < > 8.6*  --   --  8.4* 8.4* 8.0* 8.4*  MG 2.1  --  2.0  --   --   --   --   --  1.7 2.2 2.2  PHOS  --   --   --   --   --   --   --   --  2.8 3.6 3.0   < > = values in this interval not displayed.   GFR: Estimated Creatinine Clearance: 46.1 mL/min (A) (by C-G formula based on SCr of 1.25 mg/dL (H)). Recent Labs  Lab 04/30/20 0248 04/30/20 0248 04/30/20 2038 05/01/20 0528 05/02/20 0444 05/03/20 0349 05/03/20 1757 05/03/20 2206 05/04/20 0500 05/04/20 0823 05/05/20 0427  PROCALCITON 0.18  --   --  0.15  --   --   --   --   --  0.61  --   WBC 3.8*   < >  --  3.8*   < > 5.2  --  6.8 8.1  --  8.8  LATICACIDVEN  --   --  1.8  --   --   --  5.6* 4.6* 1.9  --   --    < > = values in this interval not displayed.    Liver Function Tests: Recent Labs  Lab 05/04/20 0500 05/04/20 1756 05/05/20 0419  AST 57* 41 33  ALT 33 28 25  ALKPHOS 56 50 44   BILITOT 1.6* 1.5* 1.5*  PROT 5.9* 5.4* 5.2*  ALBUMIN 2.5* 2.4* 2.1*   No results for input(s): LIPASE, AMYLASE in the last 168 hours. No results for input(s): AMMONIA in the last 168 hours.  ABG    Component Value Date/Time   PHART 7.347 (L) 05/03/2020 1334   PCO2ART 46.5 05/03/2020 1334   PO2ART 414 (H) 05/03/2020 1334   HCO3 25.9 05/03/2020 1334   TCO2 27 05/03/2020 1334   ACIDBASEDEF 1.0 05/03/2020 1334   O2SAT 70.9 05/05/2020 0419     Coagulation Profile: No results for input(s): INR, PROTIME in the last 168 hours.  Cardiac Enzymes: No results for input(s): CKTOTAL, CKMB, CKMBINDEX, TROPONINI in the last 168 hours.  HbA1C: Hgb A1c MFr Bld  Date/Time Value Ref Range Status  10/17/2019 12:05 PM 5.7 (H) 4.8 - 5.6 % Final    Comment:    (NOTE) Pre diabetes:          5.7%-6.4% Diabetes:              >6.4% Glycemic control for   <7.0% adults with diabetes      This patient is critically ill with multiple organ system failure which requires frequent high complexity decision making, assessment, support, evaluation, and titration of therapies. This was completed through the application of advanced monitoring technologies and extensive interpretation of multiple databases. During this encounter critical care time was devoted to patient care services described in this note for 36 minutes.  Julian Hy, DO 05/05/20 8:30 AM [facility]

## 2020-05-05 NOTE — Progress Notes (Signed)
ANTICOAGULATION CONSULT NOTE  Pharmacy Consult for heparin Indication: Impella and hx PE  No Known Allergies  Patient Measurements: Height: 6' (182.9 cm) Weight: 62.8 kg (138 lb 7.2 oz) IBW/kg (Calculated) : 77.6 Heparin Dosing Weight: 60kg  Vital Signs: Temp: 99.3 F (37.4 C) (12/02 1800) BP: 105/75 (12/02 1020) Pulse Rate: 98 (12/02 1800)  Labs: Recent Labs    05/03/20 2206 05/03/20 2206 05/04/20 0200 05/04/20 0205 05/04/20 0500 05/04/20 0500 05/04/20 1219 05/04/20 1756 05/05/20 0419 05/05/20 0427 05/05/20 1010 05/05/20 1057 05/05/20 1657  HGB 11.0*   < >  --   --  10.8*   < >  --   --   --  9.4* 9.9*  --   --   HCT 37.5*   < >  --   --  35.5*  --   --   --   --  30.4* 29.0*  --   --   PLT 181  --   --   --  171  --   --   --   --  122*  --   --   --   APTT  --   --    < >  --   --   --    < >  --  66*  --   --  68* 68*  HEPARINUNFRC  --   --   --  >2.20*  --   --   --  >2.20* >2.20*  --   --   --   --   CREATININE  --    < >  --   --  1.55*  --   --  1.33* 1.25*  --   --   --   --    < > = values in this interval not displayed.    Estimated Creatinine Clearance: 46.1 mL/min (A) (by C-G formula based on SCr of 1.25 mg/dL (H)).   Medical History: Past Medical History:  Diagnosis Date  . Anxiety   . CHF (congestive heart failure) ([place])   . Chronic tension headache   . Colon polyps   . Coronary artery disease 2008/2009   MI with PCI x 2, then PCI x 1 in 2009  . Depression   . Dyslipidemia   . Dysrhythmia    atrial fibrillation  . Hyperlipidemia   . Hypertension   . Lupus [facility])    sees Dr Trudie Reed  . Lupus disease of the lung   . 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 Myocardial infarction ([place]) 2008  . 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 Shortness of breath     Assessment: 51 yoM with hx recent PE (03/2020) on apixaban PTA admitted with ADHF now s/p Impella placement. Heparin infusing via the  purge solution and systemically at 200 units/h. Purge rate stable ~13.5 ml/hr, H/H down slightly. Heparin level falsely elevated by DOAC use,   aPTT continues to be therapeutic at 68 seconds. Pink tinged urine noted by RN this evening at lab draw, MD aware also. No changes for now.   Goal of Therapy:  Heparin level 0.3-0.5 units/ml aPTT 66-85 seconds Monitor platelets by anticoagulation protocol: Yes   Plan:  Impella via purge solution (50 units/ml) continues Continue heparin 200 units systemic for now Continue q12h coag checks   Erin Hearing PharmD., BCPS Clinical Pharmacist 05/05/2020 6:11 PM

## 2020-05-05 NOTE — Op Note (Signed)
Procedure(s): Right axillary artery cannulation for Impella 5.5 LVAD insertion  Cody Arellano male 74 y.o. 05/03/20 Procedure(s) and Anesthesia Type: Right axillary artery cannulation for Impella 5.5 LVAD insertion Surgeon(s) and Role: B.  Murvin Natal, MD-primary   Indications: The patient was admitted to the hospital with a brief history of heart failure.  He is status post coronary bypass grafting in May of this year.  His work-up was demonstrated atrial fibrillation and biventricular dysfunction along with severe mitral regurgitation.  He is taken to the OR for stabilization by insertion of a temporary LVAD device     Surgeon: [name]   Assistants: Staff  Anesthesia: General endotracheal anesthesia  ASA Class: 4    Procedure Detail After informed consent was obtained, the patient with the operating room on the above listed date and placed in the supine position on the operating table.  Anesthesia was induced in a smooth fashion by the general endotracheal technique.  After confirming adequate anesthesia, the anterior chest was cleansed and draped in a sterile field using Betadine solution.  A preop surgical pause was performed.  An incision was made overlying the right deltopectoral groove and dissection carried down to the subcutaneous tissue with electrocautery.  The pectoralis major minor muscles are divided.  The right axillary artery was encircled.  5000 units of heparin were given intravenously.  A 10 mm dacryon graft was sewn end-to-side to the open right axillary artery with a running suture of 5-0 Prolene.  This was used as a conduit through which the Impella device was inserted in a modified Seldinger technique using TEE guidance and fluoroscopy guidance as well.  Device was inserted across the aortic valve and the wires was withdrawn.  The position of the device was then fine-tuned with TEE guidance.  The device was secured to the level of the skin.  The incision was  closed in layers.  All sponge instruments and needle counts were correct and I was present for all aspects procedure  Findings: Improved hemodynamics with Impella device functioning Estimated Blood Loss:  Minimal         Drains: None   Blood Given: none          Specimens: None         Implants: none        Complications:  * No complications entered in OR log *         Disposition: ICU - intubated and critically ill.         Condition: stable

## 2020-05-06 ENCOUNTER — Encounter (HOSPITAL_COMMUNITY)
Admission: EM | Disposition: A | Discharge status: Rehabilitation Facility | Payer: Self-pay | Source: Home / Self Care | Attending: Cardiology

## 2020-05-06 ENCOUNTER — Inpatient Hospital Stay (HOSPITAL_COMMUNITY): Payer: No Typology Code available for payment source

## 2020-05-06 ENCOUNTER — Inpatient Hospital Stay (HOSPITAL_COMMUNITY): Payer: No Typology Code available for payment source | Admitting: Certified Registered Nurse Anesthetist

## 2020-05-06 DIAGNOSIS — R918 Other nonspecific abnormal finding of lung field: Secondary | ICD-10-CM | POA: Diagnosis not present

## 2020-05-06 DIAGNOSIS — I5023 Acute on chronic systolic (congestive) heart failure: Secondary | ICD-10-CM | POA: Diagnosis not present

## 2020-05-06 DIAGNOSIS — Z7189 Other specified counseling: Secondary | ICD-10-CM

## 2020-05-06 DIAGNOSIS — Z515 Encounter for palliative care: Secondary | ICD-10-CM | POA: Diagnosis not present

## 2020-05-06 DIAGNOSIS — I509 Heart failure, unspecified: Secondary | ICD-10-CM

## 2020-05-06 DIAGNOSIS — I340 Nonrheumatic mitral (valve) insufficiency: Secondary | ICD-10-CM

## 2020-05-06 DIAGNOSIS — I4892 Unspecified atrial flutter: Secondary | ICD-10-CM

## 2020-05-06 DIAGNOSIS — Z95811 Presence of heart assist device: Secondary | ICD-10-CM | POA: Diagnosis not present

## 2020-05-06 HISTORY — PX: TEE WITHOUT CARDIOVERSION: SHX5443

## 2020-05-06 LAB — CBC WITH DIFFERENTIAL/PLATELET
Abs Immature Granulocytes: 0.03 10*3/uL (ref 0.00–0.07)
Basophils Absolute: 0 10*3/uL (ref 0.0–0.1)
Basophils Relative: 0 %
Eosinophils Absolute: 0 10*3/uL (ref 0.0–0.5)
Eosinophils Relative: 0 %
HCT: 25.7 % — ABNORMAL LOW (ref 39.0–52.0)
Hemoglobin: 7.9 g/dL — ABNORMAL LOW (ref 13.0–17.0)
Immature Granulocytes: 0 %
Lymphocytes Relative: 12 %
Lymphs Abs: 0.9 10*3/uL (ref 0.7–4.0)
MCH: 26.3 pg (ref 26.0–34.0)
MCHC: 30.7 g/dL (ref 30.0–36.0)
MCV: 85.7 fL (ref 80.0–100.0)
Monocytes Absolute: 0.6 10*3/uL (ref 0.1–1.0)
Monocytes Relative: 7 %
Neutro Abs: 6.3 10*3/uL (ref 1.7–7.7)
Neutrophils Relative %: 81 %
Platelets: 104 10*3/uL — ABNORMAL LOW (ref 150–400)
RBC: 3 MIL/uL — ABNORMAL LOW (ref 4.22–5.81)
RDW: 18.1 % — ABNORMAL HIGH (ref 11.5–15.5)
WBC: 7.8 10*3/uL (ref 4.0–10.5)
nRBC: 0 % (ref 0.0–0.2)

## 2020-05-06 LAB — LACTATE DEHYDROGENASE: LDH: 222 U/L — ABNORMAL HIGH (ref 98–192)

## 2020-05-06 LAB — GLUCOSE, CAPILLARY
Glucose-Capillary: 54 mg/dL — ABNORMAL LOW (ref 70–99)
Glucose-Capillary: 149 mg/dL — ABNORMAL HIGH (ref 70–99)
Glucose-Capillary: 65 mg/dL — ABNORMAL LOW (ref 70–99)
Glucose-Capillary: 97 mg/dL (ref 70–99)
Glucose-Capillary: 105 mg/dL — ABNORMAL HIGH (ref 70–99)
Glucose-Capillary: 111 mg/dL — ABNORMAL HIGH (ref 70–99)

## 2020-05-06 LAB — CBC
HCT: 25 % — ABNORMAL LOW (ref 39.0–52.0)
Hemoglobin: 8 g/dL — ABNORMAL LOW (ref 13.0–17.0)
MCH: 27.4 pg (ref 26.0–34.0)
MCHC: 32 g/dL (ref 30.0–36.0)
MCV: 85.6 fL (ref 80.0–100.0)
Platelets: 97 10*3/uL — ABNORMAL LOW (ref 150–400)
RBC: 2.92 MIL/uL — ABNORMAL LOW (ref 4.22–5.81)
RDW: 18.2 % — ABNORMAL HIGH (ref 11.5–15.5)
WBC: 7.8 10*3/uL (ref 4.0–10.5)
nRBC: 0 % (ref 0.0–0.2)

## 2020-05-06 LAB — COOXEMETRY PANEL
Carboxyhemoglobin: 1.4 % (ref 0.5–1.5)
Methemoglobin: 1 % (ref 0.0–1.5)
O2 Saturation: 68.4 %
Total hemoglobin: 7.7 g/dL — ABNORMAL LOW (ref 12.0–16.0)

## 2020-05-06 LAB — APTT
aPTT: 68 seconds — ABNORMAL HIGH (ref 24–36)
aPTT: 95 seconds — ABNORMAL HIGH (ref 24–36)
aPTT: 82 seconds — ABNORMAL HIGH (ref 24–36)

## 2020-05-06 LAB — COMPREHENSIVE METABOLIC PANEL
ALT: 23 U/L (ref 0–44)
AST: 33 U/L (ref 15–41)
Albumin: 1.9 g/dL — ABNORMAL LOW (ref 3.5–5.0)
Alkaline Phosphatase: 42 U/L (ref 38–126)
Anion gap: 8 (ref 5–15)
BUN: 12 mg/dL (ref 8–23)
CO2: 23 mmol/L (ref 22–32)
Calcium: 8.2 mg/dL — ABNORMAL LOW (ref 8.9–10.3)
Chloride: 102 mmol/L (ref 98–111)
Creatinine, Ser: 0.91 mg/dL (ref 0.61–1.24)
GFR, Estimated: >60 mL/min (ref >60)
Glucose, Bld: 64 mg/dL — ABNORMAL LOW (ref 70–99)
Potassium: 3.5 mmol/L (ref 3.5–5.1)
Sodium: 133 mmol/L — ABNORMAL LOW (ref 135–145)
Total Bilirubin: 1.3 mg/dL — ABNORMAL HIGH (ref 0.3–1.2)
Total Protein: 4.8 g/dL — ABNORMAL LOW (ref 6.5–8.1)

## 2020-05-06 LAB — PROTIME-INR
INR: 1.6 — ABNORMAL HIGH (ref 0.8–1.2)
Prothrombin Time: 18.8 seconds — ABNORMAL HIGH (ref 11.4–15.2)

## 2020-05-06 LAB — PHOSPHORUS: Phosphorus: 2.5 mg/dL (ref 2.5–4.6)

## 2020-05-06 LAB — HEPARIN LEVEL (UNFRACTIONATED): Heparin Unfractionated: 1.32 IU/mL — ABNORMAL HIGH (ref 0.30–0.70)

## 2020-05-06 LAB — TRIGLYCERIDES: Triglycerides: 49 mg/dL (ref <150)

## 2020-05-06 LAB — MAGNESIUM: Magnesium: 2 mg/dL (ref 1.7–2.4)

## 2020-05-06 SURGERY — ECHOCARDIOGRAM, TRANSESOPHAGEAL
Anesthesia: Monitor Anesthesia Care

## 2020-05-06 MED ORDER — DEXTROSE 50 % IV SOLN
25.0000 g | INTRAVENOUS | Status: AC
  Administered 2020-05-06: 25 g via INTRAVENOUS

## 2020-05-06 MED ORDER — SODIUM CHLORIDE 0.9 % IV SOLN
2.0000 g | Freq: Three times a day (TID) | INTRAVENOUS | Status: DC
  Administered 2020-05-06 – 2020-05-11 (×16): 2 g via INTRAVENOUS
  Filled 2020-05-06 (×16): qty 2

## 2020-05-06 MED ORDER — DEXTROSE 50 % IV SOLN
INTRAVENOUS | Status: AC
  Filled 2020-05-06: qty 50

## 2020-05-06 MED ORDER — DEXTROSE 10 % IV SOLN: INTRAVENOUS | Status: DC

## 2020-05-06 MED ORDER — SODIUM CHLORIDE 0.9 % IV SOLN
0.0600 mg/kg/h | INTRAVENOUS | Status: DC
  Administered 2020-05-06: 0.1 mg/kg/h via INTRAVENOUS
  Filled 2020-05-06: qty 250

## 2020-05-06 MED ORDER — POTASSIUM CHLORIDE CRYS ER 20 MEQ PO TBCR: 40.0000 meq | EXTENDED_RELEASE_TABLET | Freq: Once | ORAL | Status: DC

## 2020-05-06 MED ORDER — PROSOURCE TF PO LIQD
45.0000 mL | Freq: Three times a day (TID) | ORAL | Status: DC
  Administered 2020-05-06 – 2020-05-10 (×10): 45 mL
  Filled 2020-05-06 (×8): qty 45

## 2020-05-06 MED ORDER — POTASSIUM CHLORIDE 10 MEQ/50ML IV SOLN
10.0000 meq | INTRAVENOUS | Status: AC
  Administered 2020-05-06 (×4): 10 meq via INTRAVENOUS
  Filled 2020-05-06 (×4): qty 50

## 2020-05-06 MED ORDER — SODIUM CHLORIDE 0.9 % IV SOLN: INTRAVENOUS | Status: DC

## 2020-05-06 MED ORDER — ETOMIDATE 2 MG/ML IV SOLN
INTRAVENOUS | Status: DC | PRN
  Administered 2020-05-06 (×2): 4 mg via INTRAVENOUS
  Administered 2020-05-06: 12 mg via INTRAVENOUS

## 2020-05-06 MED ORDER — VANCOMYCIN HCL IN DEXTROSE 1-5 GM/200ML-% IV SOLN
1000.0000 mg | INTRAVENOUS | Status: DC
  Administered 2020-05-06: 1000 mg via INTRAVENOUS
  Filled 2020-05-06: qty 200

## 2020-05-06 MED ORDER — RESOURCE THICKENUP CLEAR PO POWD
ORAL | Status: DC | PRN
  Filled 2020-05-06: qty 125

## 2020-05-06 MED ORDER — VITAL 1.5 CAL PO LIQD
1000.0000 mL | ORAL | Status: DC
  Administered 2020-05-06 – 2020-05-07 (×2): 1000 mL
  Filled 2020-05-06 (×2): qty 1000

## 2020-05-06 MED ORDER — DEXTROSE 5 % SOLN FOR IMPELLA PURGE CATHETER
INTRAVENOUS | Status: DC
  Administered 2020-05-06 – 2020-05-07 (×2): 500 mL
  Filled 2020-05-06 (×2): qty 1000

## 2020-05-06 MED FILL — Heparin Sodium (Porcine) Inj 1000 Unit/ML: INTRAMUSCULAR | Qty: 30 | Status: AC

## 2020-05-06 MED FILL — Sodium Chloride IV Soln 0.9%: INTRAVENOUS | Qty: 1000 | Status: AC

## 2020-05-06 NOTE — Evaluation (Signed)
Occupational Therapy Evaluation Patient Details Name: Cody Arellano MRN: [id number] DOB: 1945/07/04 Today's Date: 05/06/2020    History of Present Illness Patient was admitted with pleural effusion cardiomyopathy with ejection fraction of 30%, chronic A. fib and SLE on Imuran and Plaquenil.  Patient developed hypotension worse than baseline. Impella device placement 11/30.  Presented 12/1 for LEFT HEART CATH AND CORS/GRAFTS ANGIOGRAPHY (N/A) as a surgical intervention.   Clinical Impression   Patient admitted with the above diagnosis and procedure.  Patient eval limited to bed level with patient in the chair position.  Patient is scheduled for Cortrack placement.  Barriers and deficits listed below.  Patient states at home he was independent with bathing and dressing, was able to walk without an assistive device, no longer drives, participates with light meal prep, but does not complete home management. Currently he is severely limited in his functional capacities.  His son [name] and spouse are at the house with him, and are able to provide 24 hour assist as needed.  OT complete chair position upper and lower body therapeutic exercises.  The patient hopes to return home with home health services.  OT will continue to follow in the acute setting.      Follow Up Recommendations  Home health OT;Supervision/Assistance - 24 hour    Equipment Recommendations  3 in 1 bedside commode;Wheelchair (measurements OT);Wheelchair cushion (measurements OT)    Recommendations for Other Services       Precautions / Restrictions Precautions Precautions: Fall Precaution Comments: R upper extremity restrictions Restrictions Weight Bearing Restrictions: No Other Position/Activity Restrictions: multiple lines and leads                                                                        ADL either performed or assessed with clinical judgement   ADL Overall ADL's : Needs  assistance/impaired Eating/Feeding: NPO Eating/Feeding Details (indicate cue type and reason): Cortrak to be placed Grooming: Wash/dry hands;Wash/dry face;Bed level;Set up   Upper Body Bathing: Moderate assistance;Bed level   Lower Body Bathing: Maximal assistance;Bed level   Upper Body Dressing : Maximal assistance;Bed level   Lower Body Dressing: Total assistance;Bed level                 General ADL Comments: Nursing restricted OOB till Cortrak placement.     Vision Patient Visual Report: No change from baseline                  Pertinent Vitals/Pain Pain Assessment: Faces Faces Pain Scale: Hurts little more Pain Location: R arm Pain Descriptors / Indicators: Grimacing;Guarding Pain Intervention(s): Monitored during session     Hand Dominance Right   Extremity/Trunk Assessment Upper Extremity Assessment Upper Extremity Assessment: RUE deficits/detail RUE Deficits / Details: restricted elbow and shoulder RUE Sensation: WNL RUE Coordination: WNL   Lower Extremity Assessment Lower Extremity Assessment: Defer to PT evaluation       Communication Communication Communication: No difficulties   Cognition Arousal/Alertness: Awake/alert Behavior During Therapy: WFL for tasks assessed/performed Overall Cognitive Status: Within Functional Limits for tasks assessed  General Comments       Exercises General Exercises - Upper Extremity Shoulder Flexion: AAROM;Left;10 reps Elbow Flexion: AROM;Both;10 reps Elbow Extension: AROM;Both;10 reps Wrist Flexion: AROM;Both;10 reps Wrist Extension: AROM;Both;10 reps Digit Composite Flexion: AROM;Both;10 reps Composite Extension: AROM;Both;10 reps General Exercises - Lower Extremity Ankle Circles/Pumps: AROM;Both;10 reps Quad Sets: AROM;Both;10 reps Short Arc Quad: AROM;Both;10 reps Heel Slides: AROM;Both;10 reps Hip ABduction/ADduction: AROM;Both;10 reps    Shoulder Instructions      Home Living Family/patient expects to be discharged to:: Private residence Living Arrangements: Spouse/significant other;Children Available Help at Discharge: Family;Available 24 hours/day Type of Home: House Home Access: Stairs to enter     Home Layout: Two level;Bed/bath upstairs Alternate Level Stairs-Number of Steps: flight Alternate Level Stairs-Rails: Left Bathroom Shower/Tub: Occupational psychologist: Standard Bathroom Accessibility: Yes How Accessible: Accessible via walker Home Equipment: None;Shower seat          Prior Functioning/Environment Level of Independence: Independent                 OT Problem List: Decreased strength;Decreased activity tolerance;Impaired balance (sitting and/or standing);Pain      OT Treatment/Interventions: Self-care/ADL training;Therapeutic exercise;Balance training;Patient/family education;Therapeutic activities    OT Goals(Current goals can be found in the care plan section) Acute Rehab OT Goals Patient Stated Goal: Hoping to go home OT Goal Formulation: With patient Time For Goal Achievement: 05/20/20 Potential to Achieve Goals: Fair  OT Frequency: Min 2X/week   Barriers to D/C:    medical status       Co-evaluation              AM-PAC OT "6 Clicks" Daily Activity     Outcome Measure Help from another person eating meals?: None Help from another person taking care of personal grooming?: A Little Help from another person toileting, which includes using toliet, bedpan, or urinal?: Total Help from another person bathing (including washing, rinsing, drying)?: A Lot Help from another person to put on and taking off regular upper body clothing?: A Lot Help from another person to put on and taking off regular lower body clothing?: Total 6 Click Score: 13   End of Session    Activity Tolerance: Patient tolerated treatment well Patient left: in bed;with call bell/phone within  reach;with nursing/sitter in room;with family/visitor present  OT Visit Diagnosis: Other abnormalities of gait and mobility (R26.89);Muscle weakness (generalized) (M62.81);Pain Pain - Right/Left: Right Pain - part of body: Arm                Time: 1310-1341 OT Time Calculation (min): 31 min Charges:  OT General Charges $OT Visit: 1 Visit OT Evaluation $OT Eval Moderate Complexity: 1 Mod OT Treatments $Therapeutic Exercise: 8-22 mins  05/06/2020  Rich, OTR/L  Acute Rehabilitation Services  Office:  [place] 05/06/2020, 2:03 PM

## 2020-05-06 NOTE — Progress Notes (Signed)
Nutrition Follow-up  DOCUMENTATION CODES:   Underweight, Severe malnutrition in context of chronic illness  INTERVENTION:   No BM x 5 days, need scheduled regimen  Once Cortrak placed:  -Vital 1.5 @ 20 ml/hr -Increase by 10 ml Q4 hours to goal rate of 55 ml/hr (1320 ml) -45 ml ProSource TID  Provides: 2100 kcals, 122 grams protein, 1008 ml free water.    Magic cup TID with meals, each supplement provides 290 kcal and 9 grams of protein  NUTRITION DIAGNOSIS:   Severe Malnutrition related to chronic illness (CHF) as evidenced by severe muscle depletion, severe fat depletion, percent weight loss.  Ongoing  GOAL:   Patient will meet greater than or equal to 90% of their needs  Addressed via TF  MONITOR:   PO intake, Supplement acceptance, Skin, Weight trends, Labs, I & O's  REASON FOR ASSESSMENT:   Consult Assessment of nutrition requirement/status, Poor PO  ASSESSMENT:   74 y.o. male with medical history significant for lupus, interstitial lung disease, coronary artery disease, chronic systolic CHF, paroxysmal atrial fibrillation presents with shortness of breath. Pt found to have large right-sided pleural effusion and small left-sided pleural effusion. Chest x-ray showed cardiomegaly with pulmonary venous congestion and bilateral interstitial prominence most consistent with interstitial edema.  11/26- s/p Flexible video fiberoptic bronchoscopy and biopsies 11/30- s/p impella   Pt discussed during ICU rounds and with RN.   Now on 4L nasal cannula. TEE this am, spontaneously converted to sinus, no DCCV. Diet advanced to DYS 3 with nectar thick liquids per SLP. Cortrak placed at bedside. Titrate tube feeding to goal. May transition to nocturnal feedings once tolerance is established.   Admission weight: 66.7 kg  Current weight: 64.3 kg   UOP: 2385 ml x 24 hrs   Drips: D10 @ 50 ml/hr, LR @ 20 ml/hr   Medications: colace, MVI with minerals Labs: Mg 133 (L) CBG  64-149  Diet Order:   Diet Order            DIET DYS 3 Room service appropriate? Yes with Assist; Fluid consistency: Nectar Thick  Diet effective now                 EDUCATION NEEDS:   Education needs have been addressed  Skin:  Skin Assessment: Skin Integrity Issues: Skin Integrity Issues:: Incisions Incisions: R chest  Last BM:  11/28  Height:   Ht Readings from Last 1 Encounters:  04/29/20 6' (1.829 m)    Weight:   Wt Readings from Last 1 Encounters:  05/06/20 64.3 kg   BMI:  Body mass index is 19.23 kg/m.  Estimated Nutritional Needs:   Kcal:  2100-2300  Protein:  115-130 grams  Fluid:  > 2 L   Mariana Single RD, LDN Clinical Nutrition Pager listed in [place]

## 2020-05-06 NOTE — Consult Note (Addendum)
Consultation Note Date: 05/06/2020   Patient Name: Cody Arellano  DOB: Oct 26, 1945  MRN: [id number]  Age / Sex: 74 y.o., male  PCP: Venia Carbon, MD Referring Physician: Candee Furbish, MD  Reason for Consultation: Establishing goals of care  HPI/Patient Profile: 74 y.o. male  with past medical history of SLE, interstitial lung disease, coronary artery disease, chronic systolic heart failure, CABG 10/2019, paroxysmal atrial fibrillation on Eliquis, recent PE admitted on 04/26/2020 with shortness of breath related to cardiogenic shock with CHF EF 20-25%, severe MR/TR, afib RVR. Impella placed 11/30. Cortrak to be placed 12/3.   Clinical Assessment and Goals of Care: I met today at Cody Arellano' bedside along with his son, Cody Arellano. Cody Arellano is alert and oriented although fatigued. I discussed with them palliative care role as support and assisting with identifying his goals and clarifying wishes. We did discussed that he has very advanced disease with his heart and the signs and symptoms from this progressive disease process that have led to his functional decline. I expressed concern of where this hospitalization will lead him and how good we can get him.   When I ask Cody Arellano about his thoughts and wishes he expressed high motivation to continue with aggressive care with hopes of improvement and getting stronger. I acknowledged this desire and respect his wishes. I also spoke with them about consideration of his wishes and what he would want if his health were to decline instead of improve. We discussed resuscitation attempt and concern that he would not do well in this scenario due to his already weak heart. I encouraged Cody Arellano to consider his wishes and to have discussions with his family regarding these scenarios. I also offered family meeting with palliative care to continue to discuss these difficult topics and  decisions.   Both Cody Arellano and Cody Arellano express understanding and acknowledge the importance of having these discussions. Cody Arellano feels he would like to pursue a meeting but would like to wait a few days before proceeding. For now palliative will follow to continue support and assistance with symptoms as needed. I provided Cody Arellano with contact for palliative team to arrange family meeting when they are prepared to proceed.   All questions/concerns addressed. Emotional support provided.   Primary Decision Maker PATIENT    SUMMARY OF RECOMMENDATIONS   - Full code - Desires full aggressive care  - Open to family meeting to further discuss goals and wishes in near future  Code Status/Advance Care Planning:  Full code   Symptom Management:   Per heart failure team, PCCM.   Pain in chest related to Impella placement: Continue fentanyl and OxyIR as ordered.   Palliative Prophylaxis:   Aspiration, Bowel Regimen, Delirium Protocol, Oral Care and Turn Reposition  Additional Recommendations (Limitations, Scope, Preferences):  Full Scope Treatment  Psycho-social/Spiritual:   Desire for further Chaplaincy support:no  Prognosis:   Overall prognosis poor with advanced heart failure.   Discharge Planning: To Be Determined      Primary  Diagnoses: Present on Admission: . Pleural effusion on right . Pulmonary nodules . CAD (coronary artery disease) . Paroxysmal atrial fibrillation (HCC) . Heart failure, systolic, chronic ([place]) . Pulmonary embolism ([place]) . Hyperlipemia . Acute on chronic systolic CHF (congestive heart failure) ([place])   I have reviewed the medical record, interviewed the patient and family, and examined the patient. The following aspects are pertinent.  Past Medical History:  Diagnosis Date  . Anxiety   . CHF (congestive heart failure) ([place])   . Chronic tension headache   . Colon polyps   . Coronary artery disease 2008/2009   MI with PCI x 2, then PCI x 1 in  2009  . Depression   . Dyslipidemia   . Dysrhythmia    atrial fibrillation  . Hyperlipidemia   . Hypertension   . Lupus [facility])    sees Dr Trudie Reed  . Lupus disease of the lung   . 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 Myocardial infarction ([place]) 2008  . 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 Shortness of breath    Social History   Socioeconomic History  . Marital status: Married    Spouse name: Not on file  . Number of children: 5  . Years of education: Not on file  . Highest education level: Not on file  Occupational History  . Occupation: Engineer, production for [employer]    Comment: retired  Tobacco Use  . Smoking status: Former Smoker    Packs/day: 1.00    Years: 20.00    Pack years: 20.00    Types: Cigarettes    Quit date: 02/26/1993    Years since quitting: 27.2  . Smokeless tobacco: Never Used  . Tobacco comment: QUIT SMOKING 20 YEARS AGO  Vaping Use  . Vaping Use: Never used  Substance and Sexual Activity  . Alcohol use: No  . Drug use: No  . Sexual activity: Not on file  Other Topics Concern  . Not on file  Social History Narrative   No living will   Wife should make health care decisions--alternate is sons   Would accept resuscitation   Not sure about tube feeds   Social Determinants of Health   Financial Resource Strain:   . Difficulty of Paying Living Expenses: Not on file  Food Insecurity: No Food Insecurity  . Worried About Charity fundraiser in the Last Year: Never true  . Ran Out of Food in the Last Year: Never true  Transportation Needs: No Transportation Needs  . Lack of Transportation (Medical): No  . Lack of Transportation (Non-Medical): No  Physical Activity: Insufficiently Active  . Days of Exercise per Week: 4 days  . Minutes of Exercise per Session: 10 min  Stress:   . Feeling of Stress : Not on file  Social Connections:   . Frequency of Communication with Friends and  Family: Not on file  . Frequency of Social Gatherings with Friends and Family: Not on file  . Attends Religious Services: Not on file  . Active Member of Clubs or Organizations: Not on file  . Attends Archivist Meetings: Not on file  . Marital Status: Not on file   Family History  Problem Relation Age of Onset  . Heart disease Mother   . Alcohol abuse Father   . Hyperlipidemia Sister   . Hypertension Sister   . Hyperlipidemia Brother   .  Hypertension Brother   . Cancer Brother        ?lung cancer  . Stomach cancer Brother   . Diabetes Paternal Uncle   . Colon cancer Neg Hx   . Esophageal cancer Neg Hx   . Rectal cancer Neg Hx    Scheduled Meds: . aspirin  81 mg Oral Daily  . chlorhexidine  15 mL Mouth Rinse BID  . Chlorhexidine Gluconate Cloth  6 each Topical Daily  . dextrose      . digoxin  0.125 mg Oral Daily  . docusate  100 mg Oral BID  . mouth rinse  15 mL Mouth Rinse q12n4p  . multivitamin with minerals  1 tablet Oral Daily  . rosuvastatin  20 mg Oral Daily  . sodium chloride flush  10-40 mL Intracatheter Q12H  . sodium chloride flush  3 mL Intravenous Q12H   Continuous Infusions: . sodium chloride Stopped (05/06/20 0858)  . sodium chloride    . sodium chloride    . sodium chloride    . amiodarone 30 mg/hr (05/06/20 1100)  . ceFEPime (MAXIPIME) IV Stopped (05/06/20 0936)  . dextrose 50 mL/hr at 05/06/20 1100  . heparin 200 Units/hr (05/06/20 1100)  . impella catheter heparin 50 unit/mL in dextrose 5%    . lactated ringers 20 mL/hr at 05/06/20 1100  . norepinephrine (LEVOPHED) Adult infusion Stopped (05/05/20 1018)  . potassium chloride 50 mL/hr at 05/06/20 1100  . vancomycin Stopped (05/06/20 1044)   PRN Meds:.Place/Maintain arterial line **AND** sodium chloride, sodium chloride, acetaminophen (TYLENOL) oral liquid 160 mg/5 mL, bisacodyl, fentaNYL (SUBLIMAZE) injection, lidocaine, ondansetron (ZOFRAN) IV, oxyCODONE, polyethylene glycol, sodium  chloride flush, sodium chloride flush No Known Allergies Review of Systems  Constitutional: Positive for activity change, appetite change and fatigue.  Cardiovascular: Positive for chest pain.  Neurological: Positive for weakness.    Physical Exam Vitals and nursing note reviewed.  Constitutional:      General: He is not in acute distress.    Appearance: He is cachectic. He is ill-appearing.  Cardiovascular:     Rate and Rhythm: Normal rate.  Pulmonary:     Effort: No tachypnea, accessory muscle usage or respiratory distress.  Abdominal:     General: Abdomen is flat.  Neurological:     Mental Status: He is alert and oriented to person, place, and time.     Vital Signs: BP 105/75   Pulse 61   Temp 99.1 F (37.3 C)   Resp 20   Ht 6' (1.829 m)   Wt 64.3 kg   SpO2 100%   BMI 19.23 kg/m  Pain Scale: 0-10 POSS *See Group Information*: 2-Acceptable,Slightly drowsy, easily aroused Pain Score: 2    SpO2: SpO2: 100 % O2 Device:SpO2: 100 % O2 Flow Rate: .O2 Flow Rate (L/min): 4 L/min  IO: Intake/output summary:   Intake/Output Summary (Last 24 hours) at 05/06/2020 1107 Last data filed at 05/06/2020 1100 Gross per 24 hour  Intake 2115.55 ml  Output 2085 ml  Net 30.55 ml    LBM: Last BM Date: 05/01/20 Baseline Weight: Weight: 66.7 kg Most recent weight: Weight: 64.3 kg     Palliative Assessment/Data:     Time In: 1020 Time Out: 1110 Time Total: 50 min Greater than 50%  of this time was spent counseling and coordinating care related to the above assessment and plan.  Signed by: Vinie Sill, NP Palliative Medicine Team Pager # [phone] (M-F 8a-5p) Team Phone # [phone] (Nights/Weekends)

## 2020-05-06 NOTE — Anesthesia Postprocedure Evaluation (Signed)
Anesthesia Post Note  Patient: Cody Arellano  Procedure(s) Performed: TRANSESOPHAGEAL ECHOCARDIOGRAM (TEE) (N/A )     Patient location during evaluation: SICU Anesthesia Type: MAC Level of consciousness: awake and alert, oriented and patient cooperative Pain management: pain level controlled Vital Signs Assessment: post-procedure vital signs reviewed and stable Respiratory status: spontaneous breathing, nonlabored ventilation, respiratory function stable and patient connected to nasal cannula oxygen Cardiovascular status: remains Impella dependent. Postop Assessment: no apparent nausea or vomiting Anesthetic complications: no   No complications documented.  Last Vitals:  Vitals:   05/06/20 0915 05/06/20 0930  BP:    Pulse: 63 60  Resp: (!) 24 (!) 22  Temp: 37.4 C 37.3 C  SpO2: 95% 94%    Last Pain:  Vitals:   05/06/20 0900  TempSrc: Core  PainSc:                  Akeel Reffner,E. Rasheena Talmadge

## 2020-05-06 NOTE — Procedures (Signed)
Cortrak  Person Inserting Tube:  Esaw Dace, RD Tube Type:  Cortrak - 43 inches Tube Location:  Left nare Initial Placement:  Stomach Secured by: Bridle Technique Used to Measure Tube Placement:  Documented cm marking at nare/ corner of mouth Cortrak Secured At:  72 cm Procedure Comments:  Cortrak Tube Team Note:  Consult received to place a Cortrak feeding tube.   No x-ray is required. RN may begin using tube.   If the tube becomes dislodged please keep the tube and contact the Cortrak team at [email or web address] (password [email or web address]) for replacement.  If after hours and replacement cannot be delayed, place a NG tube and confirm placement with an abdominal x-ray.    Kerman Passey MS, RDN, LDN, CNSC Registered Dietitian III Clinical Nutrition RD Pager and On-Call Pager Number Located in [place]

## 2020-05-06 NOTE — Progress Notes (Signed)
SLP Cancellation Note  Patient Details Name: Cody Arellano MRN: [id number] DOB: 02-09-1946   Cancelled treatment:       Reason Eval/Treat Not Completed: Fatigue/lethargy limiting ability to participate (Pt still inadequately alert from TEE for swallow evaluation. SLP will f/u)  Tobie Poet I. Hardin Negus, [place], [place] Office number [phone] Pager [place] 05/06/2020, 9:09 AM

## 2020-05-06 NOTE — Evaluation (Signed)
Clinical/Bedside Swallow Evaluation Patient Details  Name: Cody Arellano MRN: [id number] Date of Birth: 05-19-1946  Today's Date: 05/06/2020 Time: SLP Start Time (ACUTE ONLY): 6962 SLP Stop Time (ACUTE ONLY): 1207 SLP Time Calculation (min) (ACUTE ONLY): 25 min  Past Medical History:  Past Medical History:  Diagnosis Date  . Anxiety   . CHF (congestive heart failure) ([place])   . Chronic tension headache   . Colon polyps   . Coronary artery disease 2008/2009   MI with PCI x 2, then PCI x 1 in 2009  . Depression   . Dyslipidemia   . Dysrhythmia    atrial fibrillation  . Hyperlipidemia   . Hypertension   . Lupus [facility])    sees Dr Trudie Reed  . Lupus disease of the lung   . 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 Myocardial infarction ([place]) 2008  . 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 Shortness of breath    Past Surgical History:  Past Surgical History:  Procedure Laterality Date  . BRONCHIAL WASHINGS  04/29/2020   Procedure: BRONCHIAL WASHINGS;  Surgeon: Garner Nash, DO;  Location: [place] [facility];  Service: Pulmonary;;  . CARDIAC CATHETERIZATION    . CLIPPING OF ATRIAL APPENDAGE N/A 10/17/2019   Procedure: Clipping Of Atrial Appendage using AtriCure [id number] 45 MM AtriClip.;  Surgeon: Rexene Alberts, MD;  Location: [place];  Service: Open Heart Surgery;  Laterality: N/A;  . CORONARY ARTERY BYPASS GRAFT N/A 10/17/2019   Procedure: CORONARY ARTERY BYPASS GRAFTING (CABG) using LIMA to LAD; Endoscopic harvest right greater saphenous vein: SVG to RAMUS.;  Surgeon: Rexene Alberts, MD;  Location: [place];  Service: Open Heart Surgery;  Laterality: N/A;  . CORONARY BALLOON ANGIOPLASTY N/A 10/17/2019   Procedure: CORONARY BALLOON ANGIOPLASTY;  Surgeon: Nelva Bush, MD;  Location: [place] [facility];  Service: Cardiovascular;  Laterality: N/A;  . coronary stents  2009  . CORONARY/GRAFT ACUTE MI REVASCULARIZATION N/A 10/17/2019   Procedure:  Coronary/Graft Acute MI Revascularization;  Surgeon: Nelva Bush, MD;  Location: [place] CV [facility];  Service: Cardiovascular;  Laterality: N/A;  . CORONARY/GRAFT ANGIOGRAPHY N/A 05/04/2020   Procedure: CORONARY/GRAFT ANGIOGRAPHY;  Surgeon: Larey Dresser, MD;  Location: [place] [facility];  Service: Cardiovascular;  Laterality: N/A;  . ENDOVEIN HARVEST OF GREATER SAPHENOUS VEIN Right 10/17/2019   Procedure: [place] Of Greater Saphenous Vein;  Surgeon: Rexene Alberts, MD;  Location: [place];  Service: Open Heart Surgery;  Laterality: Right;  . IABP INSERTION N/A 10/17/2019   Procedure: IABP Insertion;  Surgeon: Nelva Bush, MD;  Location: [place] [facility];  Service: Cardiovascular;  Laterality: N/A;  . INCISION AND DRAINAGE ABSCESS N/A 01/29/2020   Procedure: INCISION AND DRAINAGE BILATERAL PERIRECTAL ABSCESS;  Surgeon: Stark Klein, MD;  Location: [place];  Service: General;  Laterality: N/A;  . IR THORACENTESIS ASP PLEURAL SPACE W/IMG GUIDE  10/26/2019  . IR THORACENTESIS ASP PLEURAL SPACE W/IMG GUIDE  04/27/2020  . LEFT HEART CATH AND CORS/GRAFTS ANGIOGRAPHY N/A 05/04/2020   Procedure: LEFT HEART CATH AND CORS/GRAFTS ANGIOGRAPHY;  Surgeon: Larey Dresser, MD;  Location: [place] [facility];  Service: Cardiovascular;  Laterality: N/A;  . PLACEMENT OF IMPELLA LEFT VENTRICULAR ASSIST DEVICE Right 05/03/2020   Procedure: PLACEMENT OF IMPELLA 5.5 LEFT VENTRICULAR ASSIST DEVICE VIA RIGHT AXILLARY;  Surgeon: [name], MD;  Location: [place];  Service: Open Heart Surgery;  Laterality:  Right;  [name] RIGHT/LEFT HEART CATH AND CORONARY ANGIOGRAPHY N/A 10/17/2019   Procedure: RIGHT/LEFT HEART CATH AND CORONARY ANGIOGRAPHY;  Surgeon: Nelva Bush, MD;  Location: [place] [facility];  Service: Cardiovascular;  Laterality: N/A;  . TEE WITHOUT CARDIOVERSION  10/17/2019   Procedure: Transesophageal Echocardiogram (Tee);  Surgeon: Rexene Alberts, MD;  Location: [facility];  Service: Open Heart  Surgery;;  . TEE WITHOUT CARDIOVERSION N/A 05/03/2020   Procedure: TRANSESOPHAGEAL ECHOCARDIOGRAM (TEE);  Surgeon: [name], MD;  Location: [place];  Service: Open Heart Surgery;  Laterality: N/A;  . VIDEO BRONCHOSCOPY N/A 04/29/2020   Procedure: VIDEO BRONCHOSCOPY WITHOUT FLUORO;  Surgeon: Garner Nash, DO;  Location: [place];  Service: Pulmonary;  Laterality: N/A;   HPI:  Pt is a 74 y.o. male with medical history significant for lupus, interstitial lung disease, coronary artery disease, chronic systolic CHF, paroxysmal atrial fibrillation on Eliquis, and recent PE who presented to the emergency department due to shortness of breath that has been ongoing for about 2 weeks associated with nausea and intermittent nonbilious, nonbloody vomiting ~2-3 times per week. Pt had cardiogenic shock and impella placed 11/30; heart cath on 12/1. CXR 11/30: Marked worsening of airspace disease throughout the right chest most worrisome for pneumonia. CXR 12/1: Significant interval clearing of airspace opacity on the right as well as milder clearing on the left. No new opacity evident. CXR 12/2: Stable bibasilar subsegmental atelectasis. ETT 11/30-12/2.    Assessment / Plan / Recommendation Clinical Impression  Pt was seen for bedside swallow evaluation and he denied a history of dysphagia. Oral mechanism exam was [facility] for oral motor strength and ROM. Pt described his voice as "weak" and stated that vocal quality was mildly hoarse with reduced vocal intensity. Pt consistently exhibited throat clearing and/or coughing with consecutive swallows of thin liquids via straw and throat clearing was inconsistently observed with individuals sips of thin liquids via straw. No s/sx of aspiration were noted with individual or consecutive swallows of thin liquids via straw or with any solid boluses. No other symptoms of oropharyngeal dysphagia were noted. A dysphagia 3 diet with nectar thick liquids is recommended at  this time. Considering relatively short length of intubation, pt's denial of chronic dysphagia, and the milder nature of pt's symptoms, SLP anticipates that pt's dysphagia will improve within the next few days. SLP will follow pt to assess improvement of swallow function, his ability to tolerate more advanced consistencies and for an instrumental assessment if his symptoms do not resolve within the anticipated time. Thayer Headings, RN indicated that the plan is for a Cortrak to be placed today since it is anticipated that p.o. intake will be poor.  SLP Visit Diagnosis: Dysphagia, unspecified (R13.10)    Aspiration Risk  Mild aspiration risk    Diet Recommendation Dysphagia 3 (Mech soft);Nectar-thick liquid   Liquid Administration via: Cup;Straw Medication Administration: Whole meds with liquid Supervision: Staff to assist with self feeding Compensations: Slow rate;Small sips/bites Postural Changes: Seated upright at 90 degrees    Other  Recommendations Oral Care Recommendations: Oral care BID   Follow up Recommendations None      Frequency and Duration min 2x/week  2 weeks       Prognosis Prognosis for Safe Diet Advancement: Good      Swallow Study   General Date of Onset: 05/05/20 HPI: Pt is a 74 y.o. male with medical history significant for lupus, interstitial lung disease, coronary artery disease, chronic systolic CHF, paroxysmal atrial fibrillation on  Eliquis, and recent PE who presented to the emergency department due to shortness of breath that has been ongoing for about 2 weeks associated with nausea and intermittent nonbilious, nonbloody vomiting ~2-3 times per week. Pt had cardiogenic shock and impella placed 11/30; heart cath on 12/1. CXR 11/30: Marked worsening of airspace disease throughout the right chest most worrisome for pneumonia. CXR 12/1: Significant interval clearing of airspace opacity on the right as well as milder clearing on the left. No new opacity evident. CXR 12/2:  Stable bibasilar subsegmental atelectasis. ETT 11/30-12/2.  Type of Study: Bedside Swallow Evaluation Previous Swallow Assessment: None Diet Prior to this Study: NPO Temperature Spikes Noted: No Respiratory Status: Nasal cannula History of Recent Intubation: Yes Length of Intubations (days): 2 days Date extubated: 05/05/20 Behavior/Cognition: Alert;Pleasant mood;Cooperative Oral Cavity Assessment: Within Functional Limits Oral Care Completed by SLP: No Oral Cavity - Dentition: Adequate natural dentition Vision: Functional for self-feeding Self-Feeding Abilities: Needs assist Patient Positioning: Upright in bed;Postural control adequate for testing Baseline Vocal Quality: Low vocal intensity;Hoarse Volitional Cough: Weak Volitional Swallow: Able to elicit    Oral/Motor/Sensory Function Overall Oral Motor/Sensory Function: Within functional limits   Ice Chips Ice chips: Within functional limits Presentation: Spoon   Thin Liquid Thin Liquid: Impaired Presentation: Cup;Straw Pharyngeal  Phase Impairments: Throat Clearing - Immediate;Cough - Immediate (Most consistent with consecutive swallows)    Nectar Thick Nectar Thick Liquid: Not tested   Honey Thick Honey Thick Liquid: Not tested   Puree Puree: Within functional limits Presentation: Spoon   Solid     Solid: Within functional limits     Nedim Oki I. Hardin Negus, [place], [place] Office number [phone] Pager [phone]  Horton Marshall 05/06/2020,1:29 PM

## 2020-05-06 NOTE — Progress Notes (Signed)
Pharmacy Antibiotic Note  Cody Arellano is a 74 y.o. male admitted on 04/26/2020 with cardiogenic shock requiring Impella support. Pharmacy has been consulted for vancomycin and cefepime dosing with concern for PNA. Cr has improved with hemodynamic support. Cultures no growth at this point.   Plan: Adjust vancomycin to 1000mg  q24h Increase cefepime 2g q8h Follow cultures, LOT, fever curve Vancomycin levels as needed   Height: 6' (182.9 cm) Weight: 64.3 kg (141 lb 12.1 oz) IBW/kg (Calculated) : 77.6  Temp (24hrs), Avg:98.9 F (37.2 C), Min:98.2 F (36.8 C), Max:99.5 F (37.5 C)  Recent Labs  Lab 04/30/20 2038 05/01/20 0528 05/03/20 0349 05/03/20 0349 05/03/20 1714 05/03/20 1757 05/03/20 2206 05/04/20 0500 05/04/20 1756 05/05/20 0419 05/05/20 0427 05/06/20 0514 05/06/20 0515  WBC  --    < > 5.2  --   --   --  6.8 8.1  --   --  8.8  --  7.8  CREATININE  --    < > 1.69*   < > 1.80*  --   --  1.55* 1.33* 1.25*  --  0.91  --   LATICACIDVEN 1.8  --   --   --   --  5.6* 4.6* 1.9  --   --   --   --   --    < > = values in this interval not displayed.    Estimated Creatinine Clearance: 64.8 mL/min (by C-G formula based on SCr of 0.91 mg/dL).    No Known Allergies  Antimicrobials this admission: Vancomycin 12/1 >> Cefepime 12/1 >>   Microbiology results: 11/30 MRSA PCR: negative 12/1 BCx: NGTD 12/1 TA: no growth   Arrie Senate, PharmD, BCPS, [facility] Clinical Pharmacist [phone] Please check AMION for all [facility] Pharmacy numbers 05/06/2020

## 2020-05-06 NOTE — Progress Notes (Signed)
ANTICOAGULATION CONSULT NOTE  Pharmacy Consult for heparin Indication: Impella and hx PE  No Known Allergies  Patient Measurements: Height: 6' (182.9 cm) Weight: 64.3 kg (141 lb 12.1 oz) IBW/kg (Calculated) : 77.6 Heparin Dosing Weight: 60kg  Vital Signs: Temp: 99.1 F (37.3 C) (12/03 0600) Pulse Rate: 92 (12/03 0600)  Labs: Recent Labs    05/03/20 2206 05/04/20 0205 05/04/20 0500 05/04/20 0500 05/04/20 1219 05/04/20 1756 05/05/20 0419 05/05/20 0419 05/05/20 0427 05/05/20 0427 05/05/20 1010 05/05/20 1057 05/05/20 1657 05/06/20 0514 05/06/20 0515  HGB   < >  --  10.8*   < >  --   --   --   --  9.4*   < > 9.9*  --   --   --  8.0*  HCT   < >  --  35.5*   < >  --   --   --   --  30.4*  --  29.0*  --   --   --  25.0*  PLT   < >  --  171  --   --   --   --   --  122*  --   --   --   --   --  97*  APTT  --   --   --   --    < >  --  66*   < >  --   --   --  68* 68* 68*  --   HEPARINUNFRC  --  >2.20*  --   --   --  >2.20* >2.20*  --   --   --   --   --   --   --   --   CREATININE   < >  --  1.55*  --   --  1.33* 1.25*  --   --   --   --   --   --  0.91  --    < > = values in this interval not displayed.    Estimated Creatinine Clearance: 64.8 mL/min (by C-G formula based on SCr of 0.91 mg/dL).   Medical History: Past Medical History:  Diagnosis Date  . Anxiety   . CHF (congestive heart failure) ([place])   . Chronic tension headache   . Colon polyps   . Coronary artery disease 2008/2009   MI with PCI x 2, then PCI x 1 in 2009  . Depression   . Dyslipidemia   . Dysrhythmia    atrial fibrillation  . Hyperlipidemia   . Hypertension   . Lupus [facility])    sees Dr Trudie Reed  . Lupus disease of the lung   . 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 Myocardial infarction ([place]) 2008  . 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 Shortness of breath     Assessment: 26 yoM with hx recent PE (03/2020) on apixaban PTA admitted with  ADHF now s/p Impella placement. Heparin infusing via the purge solution and systemically at 200 units/h. Purge rate stable ~13.5 ml/hr.   APTT stable and therapeutic this morning, Pltc down further. HIT antibody sent, will transition heparin to bivalirudin.  Goal of Therapy:  APTT 50-80 seconds Monitor platelets by anticoagulation protocol: Yes   Plan:  -Stop systemic and purge heparin -Begin D5W purge -Add bivalirudin 0.1 mg/kg/h -Check aPTT in 4h   Arrie Senate, PharmD, [place], [facility] Clinical Pharmacist [phone]  Please check AMION for all [place] numbers 05/06/2020

## 2020-05-06 NOTE — Progress Notes (Addendum)
05/06/2020  I have seen and evaluated the patient for respiratory failure after impella.  S:  No events, denies pain, wore bipap last night. Had afib this AM, started TEE and spontaneously converted to sinus.  O: Blood pressure 105/75, pulse 61, temperature 99.1 F (37.3 C), resp. rate 20, height 6' (1.829 m), weight 64.3 kg, SpO2 100 %.  Thin man in NAD Trachea midline, MMM Mechanical hum on auscultation of chest, ext warm Minimal edema +muscle wasting Swelling around R impella site, RUE neurovascularly intact Moves all 4 ext to command  A:  Cardiogenic shock post impella placement Acute on chronic HFrEF Afib/flutter ICM, severe ?functional TR and MVR AKI- cardiorenal, improved Protein calorie malnutrition SLE Interstitial lung disease, lung nodules-  immunosuppressants on hold, no obvious evidence of active disease Question developing hematoma vs. Soft tissue swelling in R impella site Slow H/H, plt drop Question sepsis/pneumonia on vanc/cefepime  P:  -Continue BIPAP at night, wean O2 during day PRN -Impella management and anticoagulation per advanced heart failure, appreciate help -Cortrak placement today and start TF -PT/OT as able - Cefepime x 7 days, DC vanc -Palliative consult is reasonable  05/06/2020 Erskine Emery MD     NAME:  Cody Arellano, MRN:  [id number], DOB:  29-May-1946, LOS: 10 ADMISSION DATE:  04/26/2020, CONSULTATION DATE:  05/06/20 REFERRING MD:  Candee Furbish, MD, CHIEF COMPLAINT:  DOE   Brief History   Patient is a 74 year old male I was asked to evaluate at bedside due to hypotension felt secondary to cardiogenic shock.  He currently is being evaluated by pulmonary critical care recurrent effusion and pulmonary nodules.  History of present illness   Patient was admitted with pleural effusion cardiomyopathy with ejection fraction of 30%, chronic A. fib and SLE on Imuran and Plaquenil.  Patient developed hypotension worse than baseline this evening.   On my evaluation the patient is awake alert interactive says he feels weak and tired.  He does not complain of dyspnea.  O2 saturation in the high 90s on room air.  He is not breathing rapidly nor using accessory muscles.  Chest x-ray shows increased interstitial markings bilaterally blood pressure is 92/70.  Hemoglobin is 11 and creatinine is stable at 1.49, platelet count 180.  Plan is for transfer to the ICU and initiation of milrinone.  BNP is 1600.  Past Medical History  CHF w/ reduced EF, mitral regurg, CAD, Afib, SLE  Significant Hospital Events   Admitted 11/23 thora 11/23  Consults:  PCCM VIR  Procedures:  Inocencio Homes 11/24 Bronchoscopy impella 11/30 Heart catherization 12/1  Significant Diagnostic Tests:  CT chest 11/23 - R>L bilateral pleural effusions, stable nodules largest 2 in LUL, pulmonary edema, bullous emphysematous changes scattered throughout  05/06/2020  Echo: EF 20-25% with septal akinesis, mid to apical inferior akinesis, apical anterior and apex akinesis.  Moderately dilated and dysfunctional RV.  Moderate TR.  Severe MR.  Bicuspid aortic valve with no stenosis, mild aortic insufficiency.  - LHC:  1. Dominant LCx, no significant disease in this vessel.  2. 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 The LIMA-LAD is also present.  3. The SVG-ramus has been occluded. There is severe and diffuse in-stent restenosis in the proximal ramus.   Pleural fluid transudate 11/24  Micro Data:  Pleural fluid CX 11/24 - pending BAL CX 11/26 - Neg BAL AF smear - neg BAL AFB -pending BAL Fungal - no fungus BAL PJP -negative BAL Asp antigen 0.09 Antimicrobials:  Ceftriaxone 11/27> 11/28  Azithro 11/27> 11/28 Cefepime 12/1> Vancomycin 12/1>  Interim history/subjective:  Pt. Was in flutter this am, and converted out on his own while TEE was being done.  Remains on 4 L with good sats, no BiPAP overnight, denies any shortness of breath.>> No CXR this  am MAP was greater than 65 in flutter and is better now In SR Awake and alert Creatinine 0.91 K is 3.5 ( repleting now), Mag is 2, Na 133 LDH 222 ( Down Trending) Platelets have dropped from 122 to 97 overnight HGB has dropped from 9.9 to 8.0 overnight ( Volume status is + 958, so doubt hemo dilutional). HIT has been sent, plan to transition to bivalirudin. Remains on Amiodarone, heparin, D10, LR,  CBG's have been low, so D10 at present, Cortrack to be placed today WBC 7.8, Tmax 99.5 Respiratory Cx pending  Impella 5.5 P8  Flow 4.5, no alarms LDH 319 => 275>222  PAP: (19-34)/(0-14) 30/9 CVP:  [0 mmHg-4 mmHg] 1 mmHg CO:  [4.1 L/min-5.3 L/min] 4.2 L/min CI:  [2.3 L/min/m2-3 L/min/m2] 2.3 L/min/m2   Objective   Blood pressure 105/75, pulse 64, temperature 99.3 F (37.4 C), resp. rate (!) 29, height 6' (1.829 m), weight 64.3 kg, SpO2 95 %. PAP: (19-54)/(6-22) 45/15 CVP:  [0 mmHg-14 mmHg] 10 mmHg CO:  [3.8 L/min-5.3 L/min] 3.8 L/min CI:  [2.1 L/min/m2-3 L/min/m2] 2.1 L/min/m2  Vent Mode: BIPAP FiO2 (%):  [30 %] 30 % PEEP:  [6 cmH20] 6 cmH20 Pressure Support:  [12 cmH20] 12 cmH20   Intake/Output Summary (Last 24 hours) at 05/06/2020 0911 Last data filed at 05/06/2020 0737 Gross per 24 hour  Intake 1646.94 ml  Output 2210 ml  Net -563.06 ml   Filed Weights   05/03/20 0500 05/04/20 0600 05/06/20 0542  Weight: 60.1 kg 62.8 kg 64.3 kg   Physical Exam: General: frail elderly man laying in bed in NAD HEENT: temporal wasting, endotracheal tube in place, swan ganz catheter Respiratory: Breathing comfortably on  4L [place] Clear to auscultation bilaterally.Impella hum Cardiovascular: regular rate and rhythm. Rub over left lower sternal border.Impella hum. Some swelling/ fullness at right shoulder, Dressing is clean dry and intact Extremities: Minimal muscle mass, no clubbing or cyanosis, no edema, RUE PICC. Neuro: RASS equals 0, following commands, able to lift his head off the  pillow. Skin: No rashes, no lesions, dry and intact  CXR personally reviewed-proving bilateral pulmonary edema, persistent right lower lobe infiltrate.  Impella, endotracheal tube in appropriate position.  Resolved Hospital Problem list   n/a  Assessment & Plan:   Cardiogenic Shock, status post Impella placement for mechanical support Acute on chronic HFrEF Atrial fibrillation/flutter ICM (EF 25%), severe MR, severe TR, s/p CABG 10/2019. Bicuspid aortic valve Has incompletely revascularized CAD -Impella 5.5; routine daily hemolysis labs.  Appreciate cardiology's assistance.  Continue running P8. -Intolerant to guideline directed heart failure therapy due to shock-no beta-blocker, ACE inhibitor, ARB, or Entresto -Con't amiodarone milrinone d/c'd 12/3 overnight , Off Norepi -Con't digoxin -Continue norepinephrine as required to maintain MAP greater than 65; wean as tolerated -No diuresis today -Eventually will need DCCV when more compensated. Hopefully tomorrow. Echo today to re-evalate MR.  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 --home immunosuppressants: azathioprine, plaquenil on hold -Goal of euvolemia  VTE in 03/2020 Possible RLL PE this admission vs compressive atelectasis causing poor filling of pulmonary arteries on CT - ? HIT with drop in platelets -- Consider transition to  bivalirudin.  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  Trial of Megace GI prophylaxis: Not indicated Urinary catheter: Condom catheter Central lines: PICC line Glucose control: Currently euglycemic on no therapy Mobility/therapy needs: PT consult Code Status: Full code Family Communication: Updated patient Disposition: ICU  Labs   CBC: Recent Labs  Lab 05/03/20 0349 05/03/20 1043 05/03/20 2206 05/04/20 0500 05/05/20 0427 05/05/20 1010 05/06/20 0515  WBC 5.2  --  6.8 8.1 8.8  --  7.8  HGB 11.6*   < > 11.0* 10.8* 9.4* 9.9* 8.0*  HCT 40.7   < > 37.5* 35.5* 30.4* 29.0* 25.0*  MCV 92.1  --  90.1 88.3 86.1  --  85.6  PLT 190  --  181 171 122*  --  97*   < > = values in this interval not displayed.    Basic Metabolic Panel: Recent Labs  Lab 05/02/20 1410 05/02/20 1856 05/03/20 1714 05/03/20 1714 05/04/20 0500 05/04/20 1756 05/05/20 0419 05/05/20 1010 05/06/20 0514  NA 137   < > 135   < > 135 133* 135 135 133*  K 3.0*   < > 3.7   < > 4.1 3.8 3.9 3.8 3.5  CL 97*   < > 97*  --  99 99 99  --  102  CO2 29   < > 22  --  _0 --  23  GLUCOSE 191*   < > 114*  --  88 91 94  --  64*  BUN 12   < > 18  --  _1 --  12  CREATININE 1.55*   < > 1.80*  --  1.55* 1.33* 1.25*  --  0.91  CALCIUM 8.4*   < > 8.4*  --  8.4* 8.0* 8.4*  --  8.2*  MG 2.0  --   --   --  1.7 2.2 2.2  --  2.0  PHOS  --   --   --   --  2.8 3.6 3.0  --  2.5   < > = values in this  interval not displayed.   GFR: Estimated Creatinine Clearance: 64.8 mL/min (by C-G formula based on SCr of 0.91 mg/dL). Recent Labs  Lab 04/30/20 0248 04/30/20 0248 04/30/20 2038 05/01/20 0528 05/02/20 0444 05/03/20 0349 05/03/20 1757 05/03/20 2206 05/04/20 0500 05/04/20 0823 05/05/20 0427 05/06/20 0515  PROCALCITON 0.18  --   --  0.15  --   --   --   --   --  0.61  --   --   WBC 3.8*   < >  --  3.8*   < >   < >  --  6.8 8.1  --  8.8 7.8  LATICACIDVEN  --   --  1.8  --   --   --  5.6* 4.6* 1.9  --   --   --    < > = values in this interval not displayed.    Liver Function Tests: Recent Labs  Lab 05/04/20 0500 05/04/20 1756 05/05/20 0419 05/06/20 0514  AST 57* 41 33 33  ALT 33 _0 ALKPHOS 56 50 44 42  BILITOT 1.6* 1.5* 1.5* 1.3*  PROT 5.9* 5.4* 5.2* 4.8*  ALBUMIN 2.5* 2.4* 2.1* 1.9*   No results for input(s): LIPASE, AMYLASE in the last 168 hours. No results for input(s): AMMONIA in the last 168 hours.  ABG    Component Value Date/Time   PHART 7.473 (H) 05/05/2020 1010   PCO2ART 36.3 05/05/2020 1010   PO2ART 129 (H) 05/05/2020 1010   HCO3 26.6 05/05/2020 1010   TCO2 28 05/05/2020 1010   ACIDBASEDEF 1.0 05/03/2020 1334   O2SAT 68.4 05/06/2020 0514     Coagulation Profile: Recent Labs  Lab 05/06/20 0807  INR 1.6*    Cardiac Enzymes: No results for input(s): CKTOTAL, CKMB, CKMBINDEX, TROPONINI in the last 168 hours.  HbA1C: Hgb A1c MFr Bld  Date/Time Value Ref Range Status  10/17/2019 12:05 PM 5.7 (H) 4.8 - 5.6 % Final    Comment:    (NOTE) Pre diabetes:          5.7%-6.4% Diabetes:              >6.4% Glycemic control for   <7.0% adults with diabetes      This patient is critically ill with multiple organ system failure which requires frequent high complexity decision making, assessment, support, evaluation, and titration of therapies. This was completed through the application of advanced monitoring technologies and extensive  interpretation of multiple databases. During this encounter critical care time was devoted to patient care services described in this note for 40 minutes.  Magdalen Spatz, MSN, AGACNP-BC [place] for personal pager PCCM on call pager [phone] 05/06/2020 9:43 AM

## 2020-05-06 NOTE — Progress Notes (Signed)
ANTICOAGULATION CONSULT NOTE  Pharmacy Consult for Bivalirudin  Indication: Impella and hx PE  No Known Allergies  Patient Measurements: Height: 6' (182.9 cm) Weight: 64.3 kg (141 lb 12.1 oz) IBW/kg (Calculated) : 77.6 Heparin Dosing Weight: 60kg  Vital Signs: Temp: 99.5 F (37.5 C) (12/03 2000) Pulse Rate: 61 (12/03 2000)  Labs: Recent Labs    05/04/20 0500 05/04/20 1219 05/04/20 1756 05/05/20 0419 05/05/20 0427 05/05/20 0427 05/05/20 1010 05/05/20 1010 05/05/20 1057 05/06/20 0514 05/06/20 0515 05/06/20 0807 05/06/20 1654 05/06/20 2223  HGB   < >  --   --   --  9.4*   < > 9.9*   < >  --   --  8.0*  --  7.9*  --   HCT   < >  --   --   --  30.4*   < > 29.0*  --   --   --  25.0*  --  25.7*  --   PLT   < >  --   --   --  122*  --   --   --   --   --  97*  --  104*  --   APTT  --    < >  --  66*  --   --   --   --    < > 68*  --   --  95* 82*  LABPROT  --   --   --   --   --   --   --   --   --   --   --  18.8*  --   --   INR  --   --   --   --   --   --   --   --   --   --   --  1.6*  --   --   HEPARINUNFRC  --   --  >2.20* >2.20*  --   --   --   --   --  1.32*  --   --   --   --   CREATININE  --   --  1.33* 1.25*  --   --   --   --   --  0.91  --   --   --   --    < > = values in this interval not displayed.    Estimated Creatinine Clearance: 64.8 mL/min (by C-G formula based on SCr of 0.91 mg/dL).   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 Heparin infusing via the purge solution and systemically at 200 units/h. Purge rate  stable ~13.5 ml/hr.   APTT stable and therapeutic this morning, Pltc down further. HIT antibody sent, will transition heparin to bivalirudin.  12/3 PM update:  APTT just above goal No issues per Heparin changed to Bival earlier today  Goal of Therapy:  APTT 50-80 seconds Monitor platelets by anticoagulation protocol: Yes   Plan:  Dec Bival to 0.06 mg/kg/hr 0300 aPTT  Narda Bonds, PharmD, BCPS Clinical Pharmacist Phone: [phone]

## 2020-05-06 NOTE — Progress Notes (Addendum)
Patient ID: Cody Arellano, male   DOB: Oct 09, 1945, 74 y.o.   MRN: [id number] Pat    Advanced Heart Failure Rounding Note  PCP-Cardiologist: Mertie Moores, MD   Subjective:    Impella 5.5 placed on 11/30 with cardiogenic shock.    Off norep and milrinone. Remains on Amio drip + heparin drip.   Creatinine 1.59 => 1.69 => 1.8 => 1.55 => 1.25=> 0.9 .  Diuretics on hold.    CXR with possible PNA right lung.  Tm 99.95  He is on vancomycin/cefepime.   He is on amiodarone gtt 30 mg/hr with HR in 90s. EKG --A flutter 2:1 Back in NSR .  - Echo: EF 20-25% with septal akinesis, mid to apical inferior akinesis, apical anterior and apex akinesis.  Moderately dilated and dysfunctional RV.  Moderate TR.  Severe MR.  Bicuspid aortic valve with no stenosis, mild aortic insufficiency.  - LHC:  1. Dominant LCx, no significant disease in this vessel.  2. 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  The LIMA-LAD is also present.  3. The SVG-ramus has been occluded.  There is severe and diffuse in-stent restenosis in the proximal ramus.   Impella 5.5 P8  Flow 4.5, no alarms LDH 319 => 275>222   PAP: (19-34)/(0-14) 30/9 CVP:  [0 mmHg-4 mmHg] 1 mmHg CO:  [4.1 L/min-5.3 L/min] 4.2 L/min CI:  [2.3 L/min/m2-3 L/min/m2] 2.3 L/min/m2   Denies shortness of breath. Having some pain at impella site.   Objective:   Weight Range: 64.3 kg Body mass index is 19.23 kg/m.   Vital Signs:   Temp:  [98.2 F (36.8 C)-99.5 F (37.5 C)] 99.1 F (37.3 C) (12/03 0600) Pulse Rate:  [92-104] 92 (12/03 0600) Resp:  [12-28] 19 (12/03 0600) BP: (105-110)/(75-76) 105/75 (12/02 1020) SpO2:  [98 %-100 %] 100 % (12/03 0600) Arterial Line BP: (76-113)/(59-81) 92/61 (12/03 0600) FiO2 (%):  [30 %] 30 % (12/02 1020) Weight:  [64.3 kg] 64.3 kg (12/03 0542) Last BM Date: 05/01/20  Weight change: Filed Weights   05/03/20 0500 05/04/20 0600 05/06/20 0542  Weight: 60.1 kg 62.8 kg 64.3 kg     Intake/Output:   Intake/Output Summary (Last 24 hours) at 05/06/2020 0648 Last data filed at 05/06/2020 0600 Gross per 24 hour  Intake 1661.26 ml  Output 2385 ml  Net -723.74 ml      Physical Exam    General:Thin. No resp difficulty HEENT: normal Neck: supple. no JVD. Carotids 2+ bilat; no bruits. No lymphadenopathy or thryomegaly appreciated.RIJ swan  Cor: PMI nondisplaced. Regular rate & rhythm. No rubs, gallops. 2/6 HSM. Apex . R axillary impella Lungs: clear Abdomen: soft, nontender, nondistended. No hepatosplenomegaly. No bruits or masses. Good bowel sounds. Extremities: no cyanosis, clubbing, rash, edema. RUE PICC Neuro: alert & orientedx3, cranial nerves grossly intact. moves all 4 extremities w/o difficulty. Affect pleasant GU: Foley yellow urine   Telemetry   A flutter 2:1 90s   Labs    CBC Recent Labs    05/04/20 0500 05/04/20 0500 05/05/20 0427 05/05/20 1010  WBC 8.1  --  8.8  --   HGB 10.8*   < > 9.4* 9.9*  HCT 35.5*   < > 30.4* 29.0*  MCV 88.3  --  86.1  --   PLT 171  --  122*  --    < > = values in this interval not displayed.   Basic Metabolic Panel Recent Labs    05/05/20 0419 05/05/20 0419 05/05/20 1010 05/06/20  0514  NA 135   < > 135 133*  K 3.9   < > 3.8 3.5  CL 99  --   --  102  CO2 25  --   --  23  GLUCOSE 94  --   --  64*  BUN 15  --   --  12  CREATININE 1.25*  --   --  0.91  CALCIUM 8.4*  --   --  8.2*  MG 2.2  --   --  2.0  PHOS 3.0  --   --  2.5   < > = values in this interval not displayed.   Liver Function Tests Recent Labs    05/05/20 0419 05/06/20 0514  AST 33 33  ALT 25 23  ALKPHOS 44 42  BILITOT 1.5* 1.3*  PROT 5.2* 4.8*  ALBUMIN 2.1* 1.9*   No results for input(s): LIPASE, AMYLASE in the last 72 hours. Cardiac Enzymes No results for input(s): CKTOTAL, CKMB, CKMBINDEX, TROPONINI in the last 72 hours.  BNP: BNP (last 3 results) Recent Labs    04/06/20 2134 04/26/20 1319 04/30/20 0248  BNP 1,677.6*  1,824.4* 1,685.5*    ProBNP (last 3 results) No results for input(s): PROBNP in the last 8760 hours.   D-Dimer No results for input(s): DDIMER in the last 72 hours. Hemoglobin A1C No results for input(s): HGBA1C in the last 72 hours. Fasting Lipid Panel Recent Labs    05/06/20 0514  TRIG 49   Thyroid Function Tests No results for input(s): TSH, T4TOTAL, T3FREE, THYROIDAB in the last 72 hours.  Invalid input(s): FREET3  Other results:   Imaging    DG CHEST PORT 1 VIEW  Result Date: 05/05/2020 CLINICAL DATA:  Fibrosis.  Atelectasis. EXAM: PORTABLE CHEST 1 VIEW COMPARISON:  May 04, 2020. FINDINGS: Stable cardiomediastinal silhouette. Endotracheal and nasogastric tubes are unchanged in position. Impella device is unchanged. Right internal jugular Swan-Ganz catheter is noted with tip in expected position of main pulmonary artery. No pneumothorax or pleural effusion is noted. Stable bibasilar subsegmental atelectasis is noted. Bony thorax is unremarkable. IMPRESSION: Stable support apparatus. Stable bibasilar subsegmental atelectasis. No pneumothorax is noted. Electronically Signed   By: Marijo Conception M.D.   On: 05/05/2020 08:22     Medications:     Scheduled Medications: . aspirin  81 mg Oral Daily  . chlorhexidine  15 mL Mouth Rinse BID  . Chlorhexidine Gluconate Cloth  6 each Topical Daily  . dextrose      . digoxin  0.125 mg Oral Daily  . docusate  100 mg Oral BID  . mouth rinse  15 mL Mouth Rinse q12n4p  . multivitamin with minerals  1 tablet Oral Daily  . rosuvastatin  20 mg Oral Daily  . sodium chloride flush  10-40 mL Intracatheter Q12H  . sodium chloride flush  3 mL Intravenous Q12H    Infusions: . sodium chloride Stopped (05/04/20 2159)  . sodium chloride    . sodium chloride    . amiodarone 30 mg/hr (05/06/20 0600)  . ceFEPime (MAXIPIME) IV Stopped (05/05/20 2253)  . heparin 200 Units/hr (05/06/20 0600)  . impella catheter heparin 50 unit/mL in  dextrose 5%    . lactated ringers 20 mL/hr at 05/06/20 0600  . norepinephrine (LEVOPHED) Adult infusion Stopped (05/05/20 1018)  . propofol (DIPRIVAN) infusion Stopped (05/05/20 0810)  . vancomycin Stopped (05/05/20 1112)    PRN Medications: Place/Maintain arterial line **AND** sodium chloride, sodium chloride, acetaminophen (TYLENOL) oral liquid  160 mg/5 mL, bisacodyl, fentaNYL (SUBLIMAZE) injection, lidocaine, ondansetron (ZOFRAN) IV, oxyCODONE, polyethylene glycol, sodium chloride flush, sodium chloride flush   Assessment/Plan   1. 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  Echo this admission looks worse than 10/21 with EF 20-25% with septal akinesis, mid to apical inferior akinesis, apical anterior and apex akinesis, moderately dilated and dysfunctional RV, moderate TR, severe MR, bicuspid aortic valve with no stenosis, mild aortic insufficiency. Progressive/end-stage CHF worsened by onset of atrial fibrillation. Cardiogenic shock 11/30 with co-ox down to 30%, Impella 5.5 placed to allow time to stabilize and consider further steps.  Currently with CI 2.3 and co-ox 68% on P8 Impella with no alarms.   - Off NE/milrinone.   - Limited echo for Impella position. Urine clear, LDH 222  - NO diuretics today.  - Off milrinone and NE. CO-OX stable.   - Continue digoxin 0.125 with improving creatinine.  - Per Dr Aundra Dubin - I very concerned about this situation, low cardiac output by co-ox in setting of AF/RVR with severe MR and biventricular failure.  I am concerned for end-stage CHF, he is thin/cachectic.  He now has Impella 5.5 to buy [place] some time to stabilize him and get him out of atrial fibrillation by limiting vasoactive meds. He is not currently an LVAD candidate, but may be able to stabilize to cardiovert and potentially eventually Mitraclip.  2. CAD: Prior history of MI with LAD and ramus PCI. Admitted May 2021 with anterior STEMI. LHC with occluded ostial LAD  (in-stent), 90% ostial ramus, 60-70% proximal LCx, nondominant RCA. PTCA to LAD and ramus to restore flow, then CABG with LIMA-LAD and SVG-ramus.  No chest pain, doubt ACS.  Fall in EF from 10/21 to 11/21, but he did not present with ACS.  [place] 12/1 showed patient LIMA-LAD, patent native LAD, patent dominant LCx.  The SVG-ramus is occluded with severe diffuse in-stent restenosis in proximal ramus.  Ramus not intervened upon as this would be a complex intervention and unlikely to markedly improve EF.  - No chest pain.  - Continue ASA 81 and statin.    3. AKI: Creatinine now trending down with improved cardiac output. Renal function normalized.  4. Right pleural effusion: S/p thoracentesis, transudative (CHF).  5. Atrial fibrillation/flutter with RVR: Mild RVR this morning, he is on amiodarone gtt 30 mg/hr and heparin gtt. In atrial flutter.  - Looked like SR but remains in A flutter 2:1. Keep NPO   - Continue amiodarone gtt for rate control. Continue heparin drip.  6. Mitral regurgitation: Severe, ?infarct-related MR. - ?Eventual Mitraclip candidate if he can be stabilized.  - Will try to get TEE today to assess mitral valve.  7. H/o PE: In 10/21.  Keep anticoagulated. On Hep drip.  8. Bicuspid aortic valve: Mild AI, no AS.  9. SLE: H/o pericarditis.  On Imuran and hydroxychloroquine on. Holding hydroxychloroquine for now with risk for QT prolongation.  10. Pulmonary nodules/emphysema: CCM following, s/p bronch/BAL (no growth).  11. ID: Concern for right-sided PNA, ?aspiration.  Tm 99.2, WBCs normal. PCT mildly elevated 0.61.  - Cultures NGTD.  - Treat empirically with vancomycin/cefepime.  12. Thrombocytopenia: Mild drop in plts to 97  May be due to critical illness, low grade hemolysis with Impella. Check HIT. .    EKG: A flutter 2:1- Set up TEE/DC_CV  Will need TEE to assess MV. ---> Today 08:30    Length of Stay: [place], NP  05/06/2020, 6:48 AM  Advanced Heart Failure Team Pager  [phone] (M-F; [place])  Please contact [facility] for night-coverage after hours (4p -7a ) and weekends on [email or web address]  Patient seen with NP, agree with the above note.   Stable this morning off milrinone and NE.  CI 2.3, co-ox 68%.  CVP remains low.  Was extubated, no complaints this morning.   General: NAD Neck: No JVD, no thyromegaly or thyroid nodule.  Lungs: Decreased at bases.  CV: Lateral PMI.  Heart regular S1/S2, +S3, 2/6 HSM apex.  No peripheral edema.   Abdomen: Soft, nontender, no hepatosplenomegaly, no distention.  Skin: Intact without lesions or rashes.  Neurologic: Alert and oriented x 3.  Psych: Normal affect. Extremities: No clubbing or cyanosis.  HEENT: Normal.   Patient remains in atrial flutter, plan for TEE-DCCV today.  Discussed risks/benefits, he agrees with procedure.   Continue Impella 5.5, will need to work on nutrition and mobility now.  He is off pressors/inotropes.  Will get Cortrack today, will get him up out of bed.   Plts falling, LDH is low with clear urine.  Good Impella position.  ?HIT.   - Send HIT.  - Will transition from heparin gtt to bivalirudin.   CRITICAL CARE Performed by: Loralie Champagne  Total critical care time: 40 minutes  Critical care time was exclusive of separately billable procedures and treating other patients.  Critical care was necessary to treat or prevent imminent or life-threatening deterioration.  Critical care was time spent personally by me on the following activities: development of treatment plan with patient and/or surrogate as well as nursing, discussions with consultants, evaluation of patient's response to treatment, examination of patient, obtaining history from patient or surrogate, ordering and performing treatments and interventions, ordering and review of laboratory studies, ordering and review of radiographic studies, pulse oximetry and re-evaluation of patient's condition.  Loralie Champagne 05/06/2020 8:20  AM

## 2020-05-06 NOTE — Progress Notes (Signed)
Hypoglycemic Event  CBG: 54  Treatment: 25g d50  Symptoms: none, asleep  Follow-up CBG: [id number] CBG Result:149  Possible Reasons for Event: poor intake  Comments/MD notified:protocol     Amo Kuffour, Trilby Drummer

## 2020-05-06 NOTE — Progress Notes (Signed)
OT Cancellation Note  Patient Details Name: Cody Arellano MRN: [id number] DOB: Nov 07, 1945   Cancelled Treatment:    Reason Eval/Treat Not Completed: Patient's level of consciousness.  Beginning to wake after procedure.  OT to try early afternoon as appropriate.    Aquan Kope D Aribella Vavra 05/06/2020, 9:50 AM

## 2020-05-06 NOTE — Transfer of Care (Signed)
Immediate Anesthesia Transfer of Care Note  Patient: LANIER MILLON  Procedure(s) Performed: TRANSESOPHAGEAL ECHOCARDIOGRAM (TEE) (N/A )  Patient Location: ICU  Anesthesia Type:MAC  Level of Consciousness: awake  Airway & Oxygen Therapy: Patient Spontanous Breathing and Patient connected to nasal cannula oxygen  Post-op Assessment: Report given to RN and Post -op Vital signs reviewed and stable  Post vital signs: Reviewed and stable  Last Vitals:  Vitals Value Taken Time  BP    Temp 37.5 C 05/06/20 0903  Pulse 63 05/06/20 0902  Resp 23 05/06/20 0903  SpO2 93 % 05/06/20 0902  Vitals shown include unvalidated device data.  Last Pain:  Vitals:   05/06/20 0500  TempSrc:   PainSc: Asleep      Patients Stated Pain Goal: 2 (21/94/71 2527)  Complications: No complications documented.

## 2020-05-06 NOTE — CV Procedure (Addendum)
Procedure: TEE  Sedation: Per anesthesiology  Indication: Atrial flutter  Findings: Please see echo section for full report.  Mildly dilated left ventricle with severe diffuse hypokinesis, EF 20%.  Impella catheter in place at 4.1 cm from aortic valve (stable position).  Mildly dilated right ventricle with moderately decreased systolic function.  Moderate left atrial enlargement, the LA appendage has been clipped.  No thrombus in LA.  Moderate RA enlargement.  No PFO or ASD by color doppler.  Mild TR.  Moderate central mitral regurgitation, suspect functional.  PISA ERO only 7.89 cm^2, no systolic flow reversal in pulmonary vein doppler pattern. The aortic valve may be bicuspid, hard to tell as Impella catheter enters across valve.  Normal caliber thoracic aorta with mild plaque.   Patient converted to NSR with cough during procedure, no DCCV had to be done.   Cody Arellano 05/06/2020 8:57 AM

## 2020-05-06 NOTE — Anesthesia Preprocedure Evaluation (Addendum)
Anesthesia Evaluation  Patient identified by MRN, date of birth, ID band Patient awake    Reviewed: Allergy & Precautions, NPO status , Patient's Chart, lab work & pertinent test results  History of Anesthesia Complications Negative for: history of anesthetic complications  Airway Mallampati: I  TM Distance: >3 FB Neck ROM: Full    Dental  (+) Chipped, Dental Advisory Given   Pulmonary shortness of breath, former smoker,  NCO2 after extubation yesterday   breath sounds clear to auscultation       Cardiovascular hypertension, Pt. on medications + angina + CAD, + Past MI, + Cardiac Stents, + CABG and +CHF  + dysrhythmias  Rhythm:Irregular Rate:Normal  05/04/2020 ECHO: EF 20-25%, global LV hypokinesis, mild RV hypokinesis  Impella   Neuro/Psych  Headaches, Anxiety Depression 04/26/2020 SARS coronavirus NEG    GI/Hepatic negative GI ROS, Neg liver ROS,   Endo/Other  SLE  Renal/GU Renal InsufficiencyRenal disease     Musculoskeletal   Abdominal   Peds  Hematology  (+) Blood dyscrasia (Hb 8.0, plt 97K), anemia , Eliquis INR 2.1   Anesthesia Other Findings   Reproductive/Obstetrics                            Anesthesia Physical Anesthesia Plan  ASA: IV  Anesthesia Plan: MAC   Post-op Pain Management:    Induction:   PONV Risk Score and Plan: 1 and Treatment may vary due to age or medical condition  Airway Management Planned: Natural Airway and Nasal Cannula  Additional Equipment: Arterial line, CVP and PA Cath  Intra-op Plan:   Post-operative Plan:   Informed Consent: I have reviewed the patients History and Physical, chart, labs and discussed the procedure including the risks, benefits and alternatives for the proposed anesthesia with the patient or authorized representative who has indicated his/her understanding and acceptance.     Dental advisory given  Plan Discussed  with: CRNA and Surgeon  Anesthesia Plan Comments:        Anesthesia Quick Evaluation

## 2020-05-06 NOTE — Progress Notes (Signed)
PT Cancellation Note  Patient Details Name: Cody Arellano MRN: [id number] DOB: November 17, 1945   Cancelled Treatment:    Reason Eval/Treat Not Completed: Patient's level of consciousness. Pt still groggy following TEE. PT to re-attempt as time allows.   Lorriane Shire 05/06/2020, 9:53 AM  Lorrin Goodell, PT  Office # [phone] Pager [phone]

## 2020-05-06 NOTE — Progress Notes (Signed)
  Echocardiogram Echocardiogram Transesophageal has been performed.  Cody Arellano 05/06/2020, 9:08 AM

## 2020-05-07 ENCOUNTER — Inpatient Hospital Stay (HOSPITAL_COMMUNITY): Payer: No Typology Code available for payment source

## 2020-05-07 DIAGNOSIS — Z7189 Other specified counseling: Secondary | ICD-10-CM | POA: Diagnosis not present

## 2020-05-07 DIAGNOSIS — J90 Pleural effusion, not elsewhere classified: Secondary | ICD-10-CM | POA: Diagnosis not present

## 2020-05-07 DIAGNOSIS — I5023 Acute on chronic systolic (congestive) heart failure: Secondary | ICD-10-CM | POA: Diagnosis not present

## 2020-05-07 DIAGNOSIS — Z515 Encounter for palliative care: Secondary | ICD-10-CM | POA: Diagnosis not present

## 2020-05-07 LAB — TYPE AND SCREEN
ABO/RH(D): A POS
Antibody Screen: NEGATIVE
Unit division: 0
Unit division: 0

## 2020-05-07 LAB — COMPREHENSIVE METABOLIC PANEL
ALT: 36 U/L (ref 0–44)
AST: 68 U/L — ABNORMAL HIGH (ref 15–41)
Albumin: 1.7 g/dL — ABNORMAL LOW (ref 3.5–5.0)
Alkaline Phosphatase: 57 U/L (ref 38–126)
Anion gap: 9 (ref 5–15)
BUN: 12 mg/dL (ref 8–23)
CO2: 23 mmol/L (ref 22–32)
Calcium: 8 mg/dL — ABNORMAL LOW (ref 8.9–10.3)
Chloride: 101 mmol/L (ref 98–111)
Creatinine, Ser: 0.79 mg/dL (ref 0.61–1.24)
GFR, Estimated: >60 mL/min (ref >60)
Glucose, Bld: 102 mg/dL — ABNORMAL HIGH (ref 70–99)
Potassium: 3.2 mmol/L — ABNORMAL LOW (ref 3.5–5.1)
Sodium: 133 mmol/L — ABNORMAL LOW (ref 135–145)
Total Bilirubin: 1 mg/dL (ref 0.3–1.2)
Total Protein: 4.7 g/dL — ABNORMAL LOW (ref 6.5–8.1)

## 2020-05-07 LAB — APTT
aPTT: 69 seconds — ABNORMAL HIGH (ref 24–36)
aPTT: 73 seconds — ABNORMAL HIGH (ref 24–36)

## 2020-05-07 LAB — BPAM RBC
Blood Product Expiration Date: 202112302359
Blood Product Expiration Date: 202112302359
Unit Type and Rh: 6200
Unit Type and Rh: 6200

## 2020-05-07 LAB — CBC
HCT: 23.1 % — ABNORMAL LOW (ref 39.0–52.0)
Hemoglobin: 7.4 g/dL — ABNORMAL LOW (ref 13.0–17.0)
MCH: 27.3 pg (ref 26.0–34.0)
MCHC: 32 g/dL (ref 30.0–36.0)
MCV: 85.2 fL (ref 80.0–100.0)
Platelets: 99 10*3/uL — ABNORMAL LOW (ref 150–400)
RBC: 2.71 MIL/uL — ABNORMAL LOW (ref 4.22–5.81)
RDW: 18.1 % — ABNORMAL HIGH (ref 11.5–15.5)
WBC: 7.1 10*3/uL (ref 4.0–10.5)
nRBC: 0 % (ref 0.0–0.2)

## 2020-05-07 LAB — GLUCOSE, CAPILLARY
Glucose-Capillary: 100 mg/dL — ABNORMAL HIGH (ref 70–99)
Glucose-Capillary: 104 mg/dL — ABNORMAL HIGH (ref 70–99)
Glucose-Capillary: 105 mg/dL — ABNORMAL HIGH (ref 70–99)
Glucose-Capillary: 109 mg/dL — ABNORMAL HIGH (ref 70–99)
Glucose-Capillary: 117 mg/dL — ABNORMAL HIGH (ref 70–99)

## 2020-05-07 LAB — COOXEMETRY PANEL
Carboxyhemoglobin: 1.6 % — ABNORMAL HIGH (ref 0.5–1.5)
Methemoglobin: 1.1 % (ref 0.0–1.5)
O2 Saturation: 63.5 %
Total hemoglobin: 6.6 g/dL — CL (ref 12.0–16.0)

## 2020-05-07 LAB — LACTATE DEHYDROGENASE: LDH: 224 U/L — ABNORMAL HIGH (ref 98–192)

## 2020-05-07 LAB — HEPARIN INDUCED PLATELET AB (HIT ANTIBODY): Heparin Induced Plt Ab: 0.147 OD (ref 0.000–0.400)

## 2020-05-07 LAB — PREPARE RBC (CROSSMATCH)

## 2020-05-07 LAB — PHOSPHORUS: Phosphorus: 2.1 mg/dL — ABNORMAL LOW (ref 2.5–4.6)

## 2020-05-07 LAB — MAGNESIUM: Magnesium: 2 mg/dL (ref 1.7–2.4)

## 2020-05-07 MED ORDER — POTASSIUM CHLORIDE 20 MEQ PO PACK
20.0000 meq | PACK | Freq: Once | ORAL | Status: AC
  Administered 2020-05-07: 20 meq
  Filled 2020-05-07: qty 1

## 2020-05-07 MED ORDER — POTASSIUM PHOSPHATES 15 MMOLE/5ML IV SOLN
15.0000 mmol | Freq: Once | INTRAVENOUS | Status: AC
  Administered 2020-05-07: 15 mmol via INTRAVENOUS
  Filled 2020-05-07: qty 5

## 2020-05-07 MED ORDER — POTASSIUM CHLORIDE 20 MEQ PO PACK
40.0000 meq | PACK | Freq: Once | ORAL | Status: AC
  Administered 2020-05-07: 40 meq via ORAL
  Filled 2020-05-07: qty 2

## 2020-05-07 MED ORDER — POTASSIUM CHLORIDE 10 MEQ/50ML IV SOLN
10.0000 meq | INTRAVENOUS | Status: AC
  Administered 2020-05-07 (×2): 10 meq via INTRAVENOUS
  Filled 2020-05-07 (×2): qty 50

## 2020-05-07 MED ORDER — SODIUM CHLORIDE 0.9% IV SOLUTION: Freq: Once | INTRAVENOUS | Status: AC

## 2020-05-07 NOTE — Progress Notes (Signed)
Impella purge cassette and purge fluid changed with Impella rep at bedside. No adverse events throughout change and pt remained stable following change. Impella numbers stable following cassette change. Will continue to monitor pt and impella.

## 2020-05-07 NOTE — Progress Notes (Signed)
ANTICOAGULATION CONSULT NOTE  Pharmacy Consult for Bivalirudin  Indication: Impella and hx PE  No Known Allergies  Patient Measurements: Height: 6' (182.9 cm) Weight: 64.3 kg (141 lb 12.1 oz) IBW/kg (Calculated) : 77.6 Heparin Dosing Weight: 60kg  Vital Signs: Temp: 99 F (37.2 C) (12/04 0444) BP: 92/75 (12/04 0300) Pulse Rate: 58 (12/04 0444)  Labs: Recent Labs    05/04/20 1219 05/04/20 1756 05/05/20 0419 05/05/20 0427 05/05/20 0427 05/05/20 1010 05/05/20 1010 05/05/20 1057 05/06/20 0514 05/06/20 0514 05/06/20 0515 05/06/20 0807 05/06/20 1654 05/06/20 2223 05/07/20 0424  HGB  --   --   --  9.4*   < > 9.9*   < >  --   --   --  8.0*  --  7.9*  --   --   HCT  --   --   --  30.4*   < > 29.0*  --   --   --   --  25.0*  --  25.7*  --   --   PLT  --   --   --  122*  --   --   --   --   --   --  97*  --  104*  --   --   APTT   < >  --  66*  --   --   --   --    < > 68*   < >  --   --  95* 82* 69*  LABPROT  --   --   --   --   --   --   --   --   --   --   --  18.8*  --   --   --   INR  --   --   --   --   --   --   --   --   --   --   --  1.6*  --   --   --   HEPARINUNFRC  --  >2.20* >2.20*  --   --   --   --   --  1.32*  --   --   --   --   --   --   CREATININE  --  1.33* 1.25*  --   --   --   --   --  0.91  --   --   --   --   --   --    < > = values in this interval not displayed.    Estimated Creatinine Clearance: 64.8 mL/min (by C-G formula based on SCr of 0.91 mg/dL).   Medical History: Past Medical History:  Diagnosis Date  . Anxiety   . CHF (congestive heart failure) ([place])   . Chronic tension headache   . Colon polyps   . Coronary artery disease 2008/2009   MI with PCI x 2, then PCI x 1 in 2009  . Depression   . Dyslipidemia   . Dysrhythmia    atrial fibrillation  . Hyperlipidemia   . Hypertension   . Lupus [facility])    sees Dr Trudie Reed  . Lupus disease of the lung   . 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 Myocardial infarction ([place]) 2008  . S/P emergency CABG  x 2 10/17/2019   LIMA to LAD, SVG to ramus intermediate, EVH via right thigh  . Shortness of breath     Assessment: 71 yoM with hx  recent PE (03/2020) on apixaban PTA admitted with ADHF now s/p Impella placement. Heparin infusing via the purge solution and systemically at 200 units/h. Purge rate stable ~13.5 ml/hr.   APTT stable and therapeutic this morning, Pltc down further. HIT antibody sent, will transition heparin to bivalirudin.  12/4 AM update:  APTT at goal after rate decrease  Goal of Therapy:  APTT 50-80 seconds Monitor platelets by anticoagulation protocol: Yes   Plan:  Cont Bival at 0.06 mg/kg/hr 0800 aPTT  Narda Bonds, PharmD, BCPS Clinical Pharmacist Phone: [phone]

## 2020-05-07 NOTE — Progress Notes (Addendum)
Palliative Medicine Inpatient Follow Up Note   Reason for Consultation: Establishing goals of care  HPI/Patient Profile: 74 y.o. male  with past medical history of SLE, interstitial lung disease, coronary artery disease, chronic systolic heart failure, CABG 10/2019, paroxysmal atrial fibrillation on Eliquis, recent PE admitted on 04/26/2020 with shortness of breath related to cardiogenic shock with CHF EF 20-25%, severe MR/TR, afib RVR. Impella placed 11/30. Cortrak to be placed 12/3.   Palliative care was asked to get involved to aid in goals of care conversations.  Today's Discussion (05/07/2020): Chart reviewed.  I met with Cody Arellano and his wife Cody Arellano at bedside this afternoon.  I shared with him that I was [name]'s partner and would be following along with him throughout the weekend.  I reintroduced the topic of palliative care to [facility] as she was not present at the meeting yesterday.  I shared that we are specialized medical care for people living with serious illness. We focus on providing relief from the symptoms and stress of a serious illness. The goal is to improve quality of life for both the patient and the family.  I asked [name] what he understands about his present health situation.  He expressed to me that per his conversations with Dr. Algernon Huxley there is hope that he can get the additional devices off of him and that his heart is strong enough to pump on its own.  He shares if his heart is not strong enough to pump on his own he will likely be placed on medications that could help his heart though will adversely affect his overall quality of life.  He shares if neither of these interventions work then there will be nothing further which can be done.  He is very realistic about the possibility that things may not work out favorably.  We discussed the "what if's" about his current health situation.  We reviewed the hope for a beneficial outcome and the possibility of a not so favorable  outcome.  I asked him if he had ever thought about advance care planning.  He said that he is not though he would be interested in talking more about this.  I explained to he and Cody Arellano that this is truly an essential next step to take for any and every patient dealing with chronic medical diseases.  I introduced the importance of designating a healthcare power of attorney and furthermore completing a living well.  I also introduced and reviewed a MOST form in great detail.  Rusell and Cody Arellano shared that they will review the documents and continue to have these more difficult conversations with one another in the oncoming days.  I shared with Terence that I plan to follow-up he requested that on the days the palliative medicine team follows up that his wife Cody Arellano is present.  She comes every other day.  I was able to touch base with the patient's bedside RN, Judson Roch to update her on the conversations that were had.  Discussed the importance of continued conversation with family and their  medical providers regarding overall plan of care and treatment options, ensuring decisions are within the context of the patients values and GOCs.  Provided "Hard Choices for [employer]" booklet.   Questions and concerns addressed   Objective Assessment: Vital Signs Vitals:   05/07/20 1230 05/07/20 1245  BP:    Pulse: 63 65  Resp: 17 18  Temp: 99.3 F (37.4 C) 99.5 F (37.5 C)  SpO2: 94% 93%  Intake/Output Summary (Last 24 hours) at 05/07/2020 1317 Last data filed at 05/07/2020 1300 Gross per 24 hour  Intake 2819.48 ml  Output 1525 ml  Net 1294.48 ml   Last Weight  Most recent update: 05/07/2020  6:46 AM   Weight  66.2 kg (145 lb 15.1 oz)           Physical Exam Vitals and nursing note reviewed.  Constitutional:      General: He is not in acute distress.    Appearance: He is cachectic. He is ill-appearing.  Cardiovascular:     Rate and Rhythm: Normal rate.  Pulmonary:     Effort: No  tachypnea, accessory muscle usage or respiratory distress.  Abdominal:     General: Abdomen is flat.  Neurological:     Mental Status: He is alert and oriented to person, place, and time.   SUMMARY OF RECOMMENDATIONS   - Full code - Desires full aggressive care for the time being - Follow-up on advanced directives and MOST form for completion - Ongoing conversations about goals moving forward  Time Spent: 60 Greater than 50% of the time was spent in counseling and coordination of care ______________________________________________________________________________________ [facility] Team Team Cell Phone: [phone] Please utilize secure chat with additional questions, if there is no response within 30 minutes please call the above phone number  Palliative Medicine Team providers are available by phone from 7am to 7pm daily and can be reached through the team cell phone.  Should this patient require assistance outside of these hours, please call the patient's attending physician.

## 2020-05-07 NOTE — Progress Notes (Signed)
[facility] Progress Note Patient Name: Cody Arellano DOB: 12/31/1945 MRN: [id number]   Date of Service  05/07/2020  HPI/Events of Note  K+ 3.2, Phos 2.1  eICU Interventions  K+ and Phos replaced per adult electrolyte replacement protocol.        Kerry Kass Honour Schwieger 05/07/2020, 6:07 AM

## 2020-05-07 NOTE — Evaluation (Signed)
Physical Therapy Evaluation Patient Details Name: Cody Arellano MRN: [id number] DOB: [birth year] Today's Date: 05/07/2020   History of Present Illness  Pt is a 74 y.o. male with medical history significant for lupus, interstitial lung disease, coronary artery disease, chronic systolic CHF, paroxysmal atrial fibrillation on Eliquis, and recent PE who presented to the emergency department due to shortness of breath that has been ongoing for about 2 weeks associated with nausea and intermittent nonbilious, nonbloody vomiting ~2-3 times per week. Pt had cardiogenic shock and impella placed 11/30; heart cath on 12/1. CXR 11/30: Marked worsening of airspace disease throughout the right chest most worrisome for pneumonia. CXR 12/1: Significant interval clearing of airspace opacity on the right as well as milder clearing on the left. No new opacity evident. CXR 12/2: Stable bibasilar subsegmental atelectasis. ETT 11/30-12/2.    Clinical Impression  Pt admitted with above diagnosis. Pt was able to sit EOB x 10 min with min to min guard assist and do some exercises.  Difficult for pt to stand fully upright even with +2 assist.  Anticipate good progress by pt.  Will follow acutely.  Pt currently with functional limitations due to the deficits listed below (see PT Problem List). Pt will benefit from skilled PT to increase their independence and safety with mobility to allow discharge to the venue listed below.      Follow Up Recommendations CIR;Supervision/Assistance - 24 hour    Equipment Recommendations  Rolling walker with 5" wheels;3in1 (PT)    Recommendations for Other Services       Precautions / Restrictions Precautions Precautions: Fall Precaution Comments: Impella, condom cath, [place]      Mobility  Bed Mobility Overal bed mobility: Needs Assistance Bed Mobility: Rolling;Sidelying to Sit Rolling: Min assist;+2 for physical assistance Sidelying to sit: Min assist;+2 for physical  assistance       General bed mobility comments: Pt needed min assist of 2 for lines managment and a little assist for trunk elevation.  Nurse watched Impella lines.     Transfers Overall transfer level: Needs assistance Equipment used: 2 person hand held assist Transfers: Sit to/from Stand Sit to Stand: Mod assist;+2 physical assistance         General transfer comment: Attempted to stand pt but pt could not stand fully upright.  Cleared buttocks enough that he moved buttocks toward HOB.    Ambulation/Gait             General Gait Details: TBA later date  Stairs            Wheelchair Mobility    Modified Rankin (Stroke Patients Only)       Balance Overall balance assessment: Needs assistance Sitting-balance support: Feet supported;Bilateral upper extremity supported;No upper extremity supported Sitting balance-Leahy Scale: Poor Sitting balance - Comments: Pt min assist to min guard assist to sit EOB.                                      Pertinent Vitals/Pain Pain Assessment: No/denies pain    Home Living Family/patient expects to be discharged to:: Private residence Living Arrangements: Spouse/significant other;Children Available Help at Discharge: Family;Available 24 hours/day Type of Home: House Home Access: Stairs to enter   [employer] of Steps: 1 Home Layout: Two level;Bed/bath upstairs Home Equipment: None;Shower seat      Prior Function Level of Independence: Independent  Hand Dominance   Dominant Hand: Right    Extremity/Trunk Assessment   Upper Extremity Assessment Upper Extremity Assessment: Defer to OT evaluation    Lower Extremity Assessment Lower Extremity Assessment: Generalized weakness    Cervical / Trunk Assessment Cervical / Trunk Assessment: Kyphotic  Communication   Communication: No difficulties  Cognition Arousal/Alertness: Awake/alert Behavior During Therapy: WFL  for tasks assessed/performed Overall Cognitive Status: Within Functional Limits for tasks assessed                                        General Comments General comments (skin integrity, edema, etc.): HR 60-65 bpm, 99% on 2L, 99/62 initial BP and 123/78 after sitting EOB.      Exercises General Exercises - Lower Extremity Long Arc Quad: AROM;Both;10 reps;Seated Hip Flexion/Marching: AROM;Both;10 reps;Seated   Assessment/Plan    PT Assessment Patient needs continued PT services  PT Problem List Decreased activity tolerance;Decreased balance;Decreased mobility;Decreased safety awareness;Decreased knowledge of use of DME;Decreased knowledge of precautions;Cardiopulmonary status limiting activity       PT Treatment Interventions DME instruction;Gait training;Functional mobility training;Therapeutic activities;Therapeutic exercise;Balance training;Patient/family education;Stair training    PT Goals (Current goals can be found in the Care Plan section)  Acute Rehab PT Goals Patient Stated Goal: Hoping to go home PT Goal Formulation: With patient Time For Goal Achievement: 05/21/20 Potential to Achieve Goals: Good    Frequency Min 3X/week   Barriers to discharge        Co-evaluation               AM-PAC PT "6 Clicks" Mobility  Outcome Measure Help needed turning from your back to your side while in a flat bed without using bedrails?: A Little Help needed moving from lying on your back to sitting on the side of a flat bed without using bedrails?: A Lot Help needed moving to and from a bed to a chair (including a wheelchair)?: Total Help needed standing up from a chair using your arms (e.g., wheelchair or bedside chair)?: Total Help needed to walk in hospital room?: Total Help needed climbing 3-5 steps with a railing? : Total 6 Click Score: 9    End of Session Equipment Utilized During Treatment: Gait belt;Oxygen Activity Tolerance: Patient limited by  fatigue Patient left: in bed;with call bell/phone within reach;with bed alarm set;with family/visitor present;with SCD's reapplied Nurse Communication: Mobility status PT Visit Diagnosis: Muscle weakness (generalized) (M62.81)    Time: 8242-3536 PT Time Calculation (min) (ACUTE ONLY): 26 min   Charges:   PT Evaluation $PT Eval Moderate Complexity: 1 Mod PT Treatments $Therapeutic Activity: 8-22 mins        Lillianne Eick W,PT Acute Rehabilitation Services Pager:  [phone]  Office:  [phone]    Denice Paradise 05/07/2020, 3:49 PM

## 2020-05-07 NOTE — Progress Notes (Signed)
05/07/2020  I have seen and evaluated the patient for respiratory failure and heart failure.  S:  No events, denies pain, wore bipap last night. Tolerating diet this AM! Denies pain.  O: Blood pressure 91/72, pulse 65, temperature 99.1 F (37.3 C), resp. rate (!) 22, height 6' (1.829 m), weight 66.2 kg, SpO2 100 %.  Thin man in no acute distress R subclavicular fossa swelling stable No edema Moves all 4 ext to command Mentating well Impella hum on auscultation  K/Phos low, repleted LDH stable CBC stable Co-ox 64%  A:  Cardiogenic shock post impella placement Acute on chronic HFrEF Afib/flutter- resolved on amio drip ICM, severe ?functional TR and MVR AKI- cardiorenal, improved Protein calorie malnutrition and muscular deconditioning SLE Interstitial lung disease, lung nodules-  immunosuppressants on hold, no obvious evidence of active disease Question developing hematoma vs. Soft tissue swelling in R impella site, stable Question sepsis/pneumonia on cefepime, cultures neg to date  P:  -Continue BIPAP at night, wean O2 during day PRN -Continue impella on current settings, bival targeting ptt 50-80, monitor hemolytic labs - Amio/heparin drips - Cortrak with TF until able to take full PO -PT/OT as able - Cefepime x 7 days - Unclear endpoint ?mitraclip candidate, appreciate palliative involvement to discuss what to do if no options   Patient critically ill due to heart failure Interventions to address this today continuing impella support, keep euvolemic, palliative care discussions Risk of deterioration without these interventions is high  I personally spent 32 minutes providing critical care not including any separately billable procedures  Erskine Emery MD [facility] 05/07/2020 11:24 AM Personal pager: [phone] If unanswered, please page CCM On-call: [phone]

## 2020-05-07 NOTE — Progress Notes (Signed)
Patient ID: Cody Arellano, male   DOB: August 11, 1945, 74 y.o.   MRN: [id number] Pat    Advanced Heart Failure Rounding Note  PCP-Cardiologist: Mertie Moores, MD   Subjective:    Impella 5.5 placed on 11/30 with cardiogenic shock.  Off norep and milrinone. Remains on Amio drip. Heparin switched to bival. HIT panel pending. On Impella 5.5. Co-ox 64%. Remains in NSR.   CXR with possible PNA right lung.  Tm 99.7  He is on vancomycin/cefepime.   Feels weak. Denies SOB, orthopnea or PND.  Good appetite. Able to dangle and stand with help. Met with Palliative care this am.     - Echo: EF 20-25% with septal akinesis, mid to apical inferior akinesis, apical anterior and apex akinesis.  Moderately dilated and dysfunctional RV.  Moderate TR.  Severe MR.  Bicuspid aortic valve with no stenosis, mild aortic insufficiency.  - LHC:  1. Dominant LCx, no significant disease in this vessel.  2. 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  The LIMA-LAD is also present.  3. The SVG-ramus has been occluded.  There is severe and diffuse in-stent restenosis in the proximal ramus.   Impella 5.5 P8  Flow 4.5L, no alarms LDH 224   PAP: (18-45)/(2-23) 31/2 CVP:  [0 mmHg-13 mmHg] 0 mmHg PCWP:  [11 mmHg] 11 mmHg CO:  [3.8 L/min-4.5 L/min] 4.3 L/min CI:  [2.3 L/min/m2-2.5 L/min/m2] 2.4 L/min/m2   Denies shortness of breath. Having some pain at impella site.   Objective:   Weight Range: 66.2 kg Body mass index is 19.79 kg/m.   Vital Signs:   Temp:  [98.8 F (37.1 C)-99.7 F (37.6 C)] 99.5 F (37.5 C) (12/04 1400) Pulse Rate:  [57-65] 63 (12/04 1400) Resp:  [15-25] 19 (12/04 1400) BP: (88-102)/(68-77) 91/72 (12/04 0400) SpO2:  [91 %-100 %] 99 % (12/04 1400) Arterial Line BP: (62-112)/(56-74) 95/57 (12/04 1400) Weight:  [66.2 kg] 66.2 kg (12/04 0645) Last BM Date: 05/01/20  Weight change: Filed Weights   05/04/20 0600 05/06/20 0542 05/07/20 0645  Weight: 62.8 kg 64.3 kg 66.2 kg     Intake/Output:   Intake/Output Summary (Last 24 hours) at 05/07/2020 1406 Last data filed at 05/07/2020 1300 Gross per 24 hour  Intake 2708.97 ml  Output 1425 ml  Net 1283.97 ml      Physical Exam    General:  Thin No resp difficulty HEENT: normal +Cor-trak Neck: supple. RIJ swan Carotids 2+ bilat; no bruits. No lymphadenopathy or thryomegaly appreciated. Cor: PMI nondisplaced. Regular rate & rhythm. No rubs, gallops or murmurs. R chest Impella with small hematoma Lungs: clear Abdomen: soft, nontender, nondistended. No hepatosplenomegaly. No bruits or masses. Good bowel sounds. Extremities: no cyanosis, clubbing, rash, edema  Clear urine Neuro: alert & orientedx3, cranial nerves grossly intact. moves all 4 extremities w/o difficulty. Affect pleasant   Telemetry    Sinus 60s Personally reviewed  Labs    CBC Recent Labs    05/06/20 1654 05/07/20 0424  WBC 7.8 7.1  NEUTROABS 6.3  --   HGB 7.9* 7.4*  HCT 25.7* 23.1*  MCV 85.7 85.2  PLT 104* 99*   Basic Metabolic Panel Recent Labs    05/06/20 0514 05/07/20 0424  NA 133* 133*  K 3.5 3.2*  CL 102 101  CO2 23 23  GLUCOSE 64* 102*  BUN 12 12  CREATININE 0.91 0.79  CALCIUM 8.2* 8.0*  MG 2.0 2.0  PHOS 2.5 2.1*   Liver Function Tests Recent Labs  05/06/20 0514 05/07/20 0424  AST 33 68*  ALT 23 36  ALKPHOS 42 57  BILITOT 1.3* 1.0  PROT 4.8* 4.7*  ALBUMIN 1.9* 1.7*   No results for input(s): LIPASE, AMYLASE in the last 72 hours. Cardiac Enzymes No results for input(s): CKTOTAL, CKMB, CKMBINDEX, TROPONINI in the last 72 hours.  BNP: BNP (last 3 results) Recent Labs    04/06/20 2134 04/26/20 1319 04/30/20 0248  BNP 1,677.6* 1,824.4* 1,685.5*    ProBNP (last 3 results) No results for input(s): PROBNP in the last 8760 hours.   D-Dimer No results for input(s): DDIMER in the last 72 hours. Hemoglobin A1C No results for input(s): HGBA1C in the last 72 hours. Fasting Lipid Panel Recent Labs     05/06/20 0514  TRIG 49   Thyroid Function Tests No results for input(s): TSH, T4TOTAL, T3FREE, THYROIDAB in the last 72 hours.  Invalid input(s): FREET3  Other results:   Imaging    DG CHEST PORT 1 VIEW  Result Date: 05/07/2020 CLINICAL DATA:  Follow-up pleural effusion EXAM: PORTABLE CHEST 1 VIEW COMPARISON:  05/05/2020 FINDINGS: Cardiac shadow is enlarged. Postsurgical changes are noted. Impella catheter, Swan-Ganz catheter and feeding catheter are noted in satisfactory position. No endotracheal tube is seen. Patchy airspace opacity is noted throughout the right lung and left lung base with small pleural effusions. No bony abnormality is noted. Right-sided PICC line is noted at the cavoatrial junction. IMPRESSION: Tubes and lines as described above. Airspace opacities bilaterally with right worse than left. Small effusions are noted. Electronically Signed   By: Inez Catalina M.D.   On: 05/07/2020 09:02   DG Abd Portable 1V  Result Date: 05/06/2020 CLINICAL DATA:  Feeding tube placement EXAM: PORTABLE ABDOMEN - 1 VIEW COMPARISON:  None. FINDINGS: The feeding tube terminates in the stomach. IMPRESSION: The feeding tube terminates in the stomach and should be repositioned into the distal duodenum near the ligament of Treitz before use. Electronically Signed   By: Dorise Bullion III M.D   On: 05/06/2020 18:57     Medications:     Scheduled Medications: . aspirin  81 mg Oral Daily  . chlorhexidine  15 mL Mouth Rinse BID  . Chlorhexidine Gluconate Cloth  6 each Topical Daily  . digoxin  0.125 mg Oral Daily  . docusate  100 mg Oral BID  . feeding supplement (PROSource TF)  45 mL Per Tube TID  . mouth rinse  15 mL Mouth Rinse q12n4p  . multivitamin with minerals  1 tablet Oral Daily  . potassium chloride  40 mEq Oral Once  . rosuvastatin  20 mg Oral Daily  . sodium chloride flush  10-40 mL Intracatheter Q12H  . sodium chloride flush  3 mL Intravenous Q12H    Infusions: .  sodium chloride Stopped (05/06/20 0858)  . sodium chloride    . sodium chloride    . sodium chloride    . amiodarone 30 mg/hr (05/07/20 1200)  . bivalirudin (ANGIOMAX) infusion 0.5 mg/mL (Non-ACS indications) 0.0599 mg/kg/hr (05/07/20 1200)  . ceFEPime (MAXIPIME) IV Stopped (05/07/20 9628)  . dextrose Stopped (05/06/20 2218)  . dextrose 5 % Impella PURGE solution    . feeding supplement (VITAL 1.5 CAL) 40 mL/hr at 05/07/20 1203  . lactated ringers 20 mL/hr at 05/07/20 1200  . norepinephrine (LEVOPHED) Adult infusion Stopped (05/07/20 0424)  . potassium PHOSPHATE IVPB (in mmol) 43 mL/hr at 05/07/20 1200    PRN Medications: Place/Maintain arterial line **AND** sodium chloride, sodium chloride, acetaminophen (  TYLENOL) oral liquid 160 mg/5 mL, bisacodyl, fentaNYL (SUBLIMAZE) injection, lidocaine, ondansetron (ZOFRAN) IV, oxyCODONE, polyethylene glycol, Resource ThickenUp Clear, sodium chloride flush, sodium chloride flush   Assessment/Plan   1. 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  Echo this admission looks worse than 10/21 with EF 20-25% with septal akinesis, mid to apical inferior akinesis, apical anterior and apex akinesis, moderately dilated and dysfunctional RV, moderate TR, severe MR, bicuspid aortic valve with no stenosis, mild aortic insufficiency. Progressive/end-stage CHF worsened by onset of atrial fibrillation. Cardiogenic shock 11/30 with co-ox down to 30%, Impella 5.5 placed to allow time to stabilize and consider further steps.  Currently with CI 2.4 and co-ox 64% on P8 Impella with no alarms. Waveforms look good.  - Off NE/milrinone.   - Impella position and waveforms stable. LDH stable  - Volume status stable off diureitcs - Off milrinone and NE. CO-OX stable.   - Continue digoxin 0.125 with improving creatinine.  - He has end-stage biventricular HF with cardiogenic shock physiology in the setting of AF/RVR and severe MR and advanced  ischemic CM.  He now s/p Impella 5.5 to support DC-CV and medical optimization.[name] He is not currently an LVAD candidate. Now off inotropes. Maintaining NSR. Will need to start weaning Impella soon and see if he can be managed medically. I turned down to P-7 and will follow.  - Palliative Care has seen and continue to discuss [place] 2. CAD: Prior history of MI with LAD and ramus PCI. Admitted May 2021 with anterior STEMI. LHC with occluded ostial LAD (in-stent), 90% ostial ramus, 60-70% proximal LCx, nondominant RCA. PTCA to LAD and ramus to restore flow, then CABG with LIMA-LAD and SVG-ramus.  No chest pain, doubt ACS.  Fall in EF from 10/21 to 11/21, but he did not present with ACS.  [place] 12/1 showed patient LIMA-LAD, patent native LAD, patent dominant LCx.  The SVG-ramus is occluded with severe diffuse in-stent restenosis in proximal ramus.  Ramus not intervened upon as this would be a complex intervention and unlikely to markedly improve EF.  - No s/s angina - Continue ASA 81 and statin.    3. AKI: Creatinine now trending down with improved cardiac output. Renal function normalized. No change 4. Right pleural effusion: S/p thoracentesis, transudative (CHF).  5. Atrial fibrillation/flutter with RVR: Mild RVR this morning, he is on amiodarone gtt 30 mg/hr and heparin gtt. In atrial flutter.  -s/p DC-CV on 12/3 - maintaining NSR on IV amio. Continue heparin 6. Mitral regurgitation: - TEE 12/3 with moderate central MR - Doubt MitraClip will help him much with moderate MR and he is likely too advanced (Mitra-FR vc Coapt patient) 7. H/o PE: In 10/21.  Keep anticoagulated. On Hep drip.  8. Bicuspid aortic valve: Mild AI, no AS.  9. SLE: H/o pericarditis.  On Imuran and hydroxychloroquine on. Holding hydroxychloroquine for now with risk for QT prolongation.  10. Pulmonary nodules/emphysema: CCM following, s/p bronch/BAL (no growth).  11. ID: Concern for right-sided PNA, ?aspiration.  Tm 99.7, WBCs normal.  PCT mildly elevated 0.61.  - Cultures NGTD.  - Treating empirically with cefepime. (vanc stopped) 12. Thrombocytopenia: Mild drop in plts to 97. Stable at 49 today  May be due to critical illness, low grade hemolysis with Impella.  13. Hypokalemia - supp  14. Acute blood loss anemia - no obvious site - hgb 7.4. Transfuse 1u RBC. Check iron stores  CRITICAL CARE Performed by: Glori Bickers  Total critical care time: 35 minutes  Critical care time was exclusive of separately billable procedures and treating other patients.  Critical care was necessary to treat or prevent imminent or life-threatening deterioration.  Critical care was time spent personally by me (independent of midlevel providers or residents) on the following activities: development of treatment plan with patient and/or surrogate as well as nursing, discussions with consultants, evaluation of patient's response to treatment, examination of patient, obtaining history from patient or surrogate, ordering and performing treatments and interventions, ordering and review of laboratory studies, ordering and review of radiographic studies, pulse oximetry and re-evaluation of patient's condition.     Length of Stay: 57  Glori Bickers, MD  05/07/2020, 2:06 PM  Advanced Heart Failure Team Pager [phone] (M-F; 7a - 4p)  Please contact [facility] for night-coverage after hours (4p -7a ) and weekends on [email or web address]

## 2020-05-07 NOTE — Plan of Care (Signed)
  Problem: Clinical Measurements: Goal: Diagnostic test results will improve Outcome: Progressing Goal: Respiratory complications will improve Outcome: Progressing Goal: Cardiovascular complication will be avoided Outcome: Progressing   Problem: Activity: Goal: Risk for activity intolerance will decrease Outcome: Progressing Note: Dangled on edge of bed and stood x2 with PT and RN.   Problem: Nutrition: Goal: Adequate nutrition will be maintained Outcome: Progressing Note: Increasing tube feed rate in addition to tolerating some PO intake   Problem: Coping: Goal: Level of anxiety will decrease Outcome: Progressing   Problem: Elimination: Goal: Will not experience complications related to urinary retention Outcome: Progressing   Problem: Pain Managment: Goal: General experience of comfort will improve Outcome: Progressing   Problem: Safety: Goal: Ability to remain free from injury will improve Outcome: Progressing   Problem: Cardiovascular: Goal: Vascular access site(s) Level 0-1 will be maintained Outcome: Progressing

## 2020-05-07 NOTE — Progress Notes (Addendum)
ANTICOAGULATION CONSULT NOTE  Pharmacy Consult for Bivalirudin  Indication: Impella and hx PE  No Known Allergies  Patient Measurements: Height: 6' (182.9 cm) Weight: 66.2 kg (145 lb 15.1 oz) IBW/kg (Calculated) : 77.6 Heparin Dosing Weight: 60kg  Vital Signs: Temp: 99 F (37.2 C) (12/04 0900) BP: 91/72 (12/04 0400) Pulse Rate: 61 (12/04 0900)  Labs: Recent Labs    05/04/20 1219 05/04/20 1756 05/05/20 0419 05/05/20 0427 05/06/20 0514 05/06/20 0515 05/06/20 0515 05/06/20 0807 05/06/20 1654 05/06/20 1654 05/06/20 2223 05/07/20 0424 05/07/20 0820  HGB  --   --   --    < >  --  8.0*   < >  --  7.9*  --   --  7.4*  --   HCT  --   --   --    < >  --  25.0*  --   --  25.7*  --   --  23.1*  --   PLT  --   --   --    < >  --  97*  --   --  104*  --   --  99*  --   APTT   < >  --  66*   < > 68*  --    < >  --  95*   < > 82* 69* 73*  LABPROT  --   --   --   --   --   --   --  18.8*  --   --   --   --   --   INR  --   --   --   --   --   --   --  1.6*  --   --   --   --   --   HEPARINUNFRC  --  >2.20* >2.20*  --  1.32*  --   --   --   --   --   --   --   --   CREATININE   < > 1.33* 1.25*  --  0.91  --   --   --   --   --   --  0.79  --    < > = values in this interval not displayed.    Estimated Creatinine Clearance: 75.9 mL/min (by C-G formula based on SCr of 0.79 mg/dL).   Medical History: Past Medical History:  Diagnosis Date  . Anxiety   . CHF (congestive heart failure) ([place])   . Chronic tension headache   . Colon polyps   . Coronary artery disease 2008/2009   MI with PCI x 2, then PCI x 1 in 2009  . Depression   . Dyslipidemia   . Dysrhythmia    atrial fibrillation  . Hyperlipidemia   . Hypertension   . Lupus [facility])    sees Dr Trudie Reed  . Lupus disease of the lung   . 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 Myocardial infarction ([place]) 2008  . 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 Shortness of breath      Assessment: 19 yoM with hx recent PE (03/2020) on apixaban PTA admitted with ADHF now s/p Impella placement. Heparin infusing via the purge solution and systemically at 200 units/h. Purge rate stable ~13.5 ml/hr.   APTT stable and therapeutic this morning, Pltc down further. HIT antibody pending.  On IV, systemic bivalirudin, no  additional anticoagulants are added to Impella purge solution.    Goal of Therapy:  APTT 50-80 seconds Monitor platelets by anticoagulation protocol: Yes   Plan:  Cont Bival at 0.06 mg/kg/hr Daily aPTT and CBC.  [place] Crane, Roylene Reason, BCCP Clinical Pharmacist  05/07/2020 9:07 AM   [facility] pharmacy phone numbers are listed on [email or web address]

## 2020-05-08 ENCOUNTER — Encounter (HOSPITAL_COMMUNITY): Payer: Self-pay | Admitting: Cardiology

## 2020-05-08 DIAGNOSIS — Z7189 Other specified counseling: Secondary | ICD-10-CM | POA: Diagnosis not present

## 2020-05-08 DIAGNOSIS — J90 Pleural effusion, not elsewhere classified: Secondary | ICD-10-CM | POA: Diagnosis not present

## 2020-05-08 DIAGNOSIS — Z515 Encounter for palliative care: Secondary | ICD-10-CM | POA: Diagnosis not present

## 2020-05-08 LAB — COOXEMETRY PANEL
Carboxyhemoglobin: 1.8 % — ABNORMAL HIGH (ref 0.5–1.5)
Carboxyhemoglobin: 1.8 % — ABNORMAL HIGH (ref 0.5–1.5)
Methemoglobin: 0.6 % (ref 0.0–1.5)
Methemoglobin: 0.7 % (ref 0.0–1.5)
O2 Saturation: 95.9 %
O2 Saturation: 60.2 %
Total hemoglobin: 8.1 g/dL — ABNORMAL LOW (ref 12.0–16.0)
Total hemoglobin: 7.8 g/dL — ABNORMAL LOW (ref 12.0–16.0)

## 2020-05-08 LAB — COMPREHENSIVE METABOLIC PANEL
ALT: 62 U/L — ABNORMAL HIGH (ref 0–44)
AST: 111 U/L — ABNORMAL HIGH (ref 15–41)
Albumin: 1.7 g/dL — ABNORMAL LOW (ref 3.5–5.0)
Alkaline Phosphatase: 83 U/L (ref 38–126)
Anion gap: 6 (ref 5–15)
BUN: 13 mg/dL (ref 8–23)
CO2: 23 mmol/L (ref 22–32)
Calcium: 7.7 mg/dL — ABNORMAL LOW (ref 8.9–10.3)
Chloride: 103 mmol/L (ref 98–111)
Creatinine, Ser: 0.68 mg/dL (ref 0.61–1.24)
GFR, Estimated: >60 mL/min (ref >60)
Glucose, Bld: 117 mg/dL — ABNORMAL HIGH (ref 70–99)
Potassium: 3.7 mmol/L (ref 3.5–5.1)
Sodium: 132 mmol/L — ABNORMAL LOW (ref 135–145)
Total Bilirubin: 1.1 mg/dL (ref 0.3–1.2)
Total Protein: 4.8 g/dL — ABNORMAL LOW (ref 6.5–8.1)

## 2020-05-08 LAB — GLUCOSE, CAPILLARY
Glucose-Capillary: 77 mg/dL (ref 70–99)
Glucose-Capillary: 90 mg/dL (ref 70–99)
Glucose-Capillary: 97 mg/dL (ref 70–99)
Glucose-Capillary: 97 mg/dL (ref 70–99)
Glucose-Capillary: 97 mg/dL (ref 70–99)

## 2020-05-08 LAB — APTT
aPTT: 76 seconds — ABNORMAL HIGH (ref 24–36)
aPTT: 69 seconds — ABNORMAL HIGH (ref 24–36)

## 2020-05-08 LAB — CBC
HCT: 25.4 % — ABNORMAL LOW (ref 39.0–52.0)
Hemoglobin: 7.8 g/dL — ABNORMAL LOW (ref 13.0–17.0)
MCH: 25.6 pg — ABNORMAL LOW (ref 26.0–34.0)
MCHC: 30.7 g/dL (ref 30.0–36.0)
MCV: 83.3 fL (ref 80.0–100.0)
Platelets: 102 10*3/uL — ABNORMAL LOW (ref 150–400)
RBC: 3.05 MIL/uL — ABNORMAL LOW (ref 4.22–5.81)
RDW: 19.6 % — ABNORMAL HIGH (ref 11.5–15.5)
WBC: 6.7 10*3/uL (ref 4.0–10.5)
nRBC: 0.3 % — ABNORMAL HIGH (ref 0.0–0.2)

## 2020-05-08 LAB — LACTATE DEHYDROGENASE: LDH: 235 U/L — ABNORMAL HIGH (ref 98–192)

## 2020-05-08 LAB — HEPARIN LEVEL (UNFRACTIONATED): Heparin Unfractionated: 0.98 IU/mL — ABNORMAL HIGH (ref 0.30–0.70)

## 2020-05-08 LAB — MAGNESIUM: Magnesium: 1.9 mg/dL (ref 1.7–2.4)

## 2020-05-08 LAB — PHOSPHORUS: Phosphorus: 1.6 mg/dL — ABNORMAL LOW (ref 2.5–4.6)

## 2020-05-08 MED ORDER — WHITE PETROLATUM EX OINT
TOPICAL_OINTMENT | CUTANEOUS | Status: DC | PRN
  Filled 2020-05-08: qty 28.35

## 2020-05-08 MED ORDER — STERILE WATER FOR INJECTION IJ SOLN
INTRAMUSCULAR | Status: DC
  Filled 2020-05-08 (×2): qty 2

## 2020-05-08 MED ORDER — HEPARIN SODIUM (PORCINE) 5000 UNIT/ML IJ SOLN
INTRAVENOUS | Status: DC
  Filled 2020-05-08 (×4): qty 10

## 2020-05-08 MED ORDER — HEPARIN (PORCINE) 25000 UT/250ML-% IV SOLN
400.0000 [IU]/h | INTRAVENOUS | Status: DC
  Administered 2020-05-08: 100 [IU]/h via INTRAVENOUS
  Administered 2020-05-09: 200 [IU]/h via INTRAVENOUS
  Filled 2020-05-08: qty 250

## 2020-05-08 MED ORDER — POTASSIUM CHLORIDE CRYS ER 20 MEQ PO TBCR
40.0000 meq | EXTENDED_RELEASE_TABLET | Freq: Once | ORAL | Status: AC
  Administered 2020-05-08: 40 meq via ORAL
  Filled 2020-05-08: qty 2

## 2020-05-08 NOTE — Progress Notes (Signed)
Coox drawn off aline, will redraw from central line.   Results for [name], Cody Arellano (MRN [id number]) as of 05/08/2020 06:49  Ref. Range 05/08/2020 05:42  Total hemoglobin Latest Ref Range: 12.0 - 16.0 g/dL 8.1 (L)  Carboxyhemoglobin Latest Ref Range: 0.5 - 1.5 % 1.8 (H)  Methemoglobin Latest Ref Range: 0.0 - 1.5 % 0.6  O2 Saturation Latest Units: % 95.9

## 2020-05-08 NOTE — Plan of Care (Signed)
  Problem: Clinical Measurements: Goal: Respiratory complications will improve Outcome: Progressing Goal: Cardiovascular complication will be avoided Outcome: Progressing   Problem: Nutrition: Goal: Adequate nutrition will be maintained Outcome: Progressing   Problem: Pain Managment: Goal: General experience of comfort will improve Outcome: Progressing   

## 2020-05-08 NOTE — Progress Notes (Signed)
   Palliative Medicine Inpatient Follow Up Note   Reason for Consultation: Establishing goals of care  HPI/Patient Profile: 74 y.o. male  with past medical history of SLE, interstitial lung disease, coronary artery disease, chronic systolic heart failure, CABG 10/2019, paroxysmal atrial fibrillation on Eliquis, recent PE admitted on 04/26/2020 with shortness of breath related to cardiogenic shock with CHF EF 20-25%, severe MR/TR, afib RVR. Impella placed 11/30. Cortrak to be placed 12/3.   Palliative care was asked to get involved to aid in goals of care conversations.  Today's Discussion (05/08/2020): Chart reviewed.  I met with Cody Arellano and his daughter. Cody Arellano is feeling about the same today without any significant complaints.   I explained to patients daughter what we do on Palliative care and how we assist patients like Cody Arellano.  I shared that we will be an extra layer for support throughout his hospital journey.  He vocalized that we can help with things like complex symptom management, advanced care planning, and goals of care conversations.  She understand this and was thankful for my introduction.  I shared with Cody Arellano that we will continue to follow along throughout the week and as needed by Dr. Aundra Dubin.   Questions and concerns addressed   Objective Assessment: Vital Signs Vitals:   05/08/20 1530 05/08/20 1545  BP:    Pulse: 62 61  Resp: 18 (!) 22  Temp: 99.7 F (37.6 C) 99.7 F (37.6 C)  SpO2: 99% 100%    Intake/Output Summary (Last 24 hours) at 05/08/2020 1554 Last data filed at 05/08/2020 1500 Gross per 24 hour  Intake 2417.15 ml  Output 1575 ml  Net 842.15 ml   Last Weight  Most recent update: 05/08/2020  1:47 AM   Weight  67.3 kg (148 lb 5.9 oz)           Physical Exam Vitals and nursing note reviewed.  Constitutional:      General: He is not in acute distress.    Appearance: He is cachectic. He is ill-appearing.  Cardiovascular:     Rate and Rhythm: Normal  rate.  Pulmonary:     Effort: No tachypnea, accessory muscle usage or respiratory distress.  Abdominal:     General: Abdomen is flat.  Neurological:     Mental Status: He is alert and oriented to person, place, and time.   SUMMARY OF RECOMMENDATIONS   - Full code - Desires full aggressive care for the time being - Follow-up on advanced directives and MOST form for completion with patient and his wife, Cody Arellano - Ongoing conversations about goals moving forward  Time Spent: 25 Greater than 50% of the time was spent in counseling and coordination of care ______________________________________________________________________________________ [place] Team Team Cell Phone: [phone] Please utilize secure chat with additional questions, if there is no response within 30 minutes please call the above phone number  Palliative Medicine Team providers are available by phone from 7am to 7pm daily and can be reached through the team cell phone.  Should this patient require assistance outside of these hours, please call the patient's attending physician.

## 2020-05-08 NOTE — Progress Notes (Signed)
ANTICOAGULATION CONSULT NOTE  Pharmacy Consult for Heparin  Indication: Impella and hx PE  No Known Allergies  Patient Measurements: Height: 6' (182.9 cm) Weight: 67.3 kg (148 lb 5.9 oz) IBW/kg (Calculated) : 77.6 Heparin Dosing Weight: 60kg  Vital Signs: Temp: 99.7 F (37.6 C) (12/05 1930) Temp Source: Core (12/05 1600) Pulse Rate: 59 (12/05 1930)  Labs: Recent Labs    05/06/20 0514 05/06/20 0515 05/06/20 0807 05/06/20 1654 05/06/20 2223 05/07/20 0424 05/07/20 0424 05/07/20 0820 05/08/20 0542 05/08/20 1240 05/08/20 2223  HGB  --    < >  --  7.9*  --  7.4*  --   --  7.8*  --   --   HCT  --    < >  --  25.7*  --  23.1*  --   --  25.4*  --   --   PLT  --    < >  --  104*  --  99*  --   --  102*  --   --   APTT 68*   < >  --  95*   < > 69*   < > 73*  --  76* 69*  LABPROT  --   --  18.8*  --   --   --   --   --   --   --   --   INR  --   --  1.6*  --   --   --   --   --   --   --   --   HEPARINUNFRC 1.32*  --   --   --   --   --   --   --   --  0.98*  --   CREATININE 0.91  --   --   --   --  0.79  --   --  0.68  --   --    < > = values in this interval not displayed.    Estimated Creatinine Clearance: 77.1 mL/min (by C-G formula based on SCr of 0.68 mg/dL).   Assessment: 74 yo Male with hx recent PE (03/2020) on apixaban PTA admitted with ADHF now s/p Impella placement, for heparin.   RN reported issues with Impella purge flow rate earlier in the evening, now resolved.  APTT within goal range  Goal of Therapy:  APTT 66-85 Monitor platelets by anticoagulation protocol: Yes   Plan:  Continue Heparin at current rate  Follow-up am labs.   Phillis Knack, PharmD, BCPS

## 2020-05-08 NOTE — Progress Notes (Signed)
Inpatient Rehab Admissions Coordinator Note:   Per PT recommendation, pt was screened for CIR candidacy by Gayland Curry, MS, CCC-SLP.  At this time we are recommending an inpatient rehab consult.  AC will contact attending to request consult order.  Please contact me with questions.    Gayland Curry, [place], CCC-SLP Admissions Coordinator [phone] 05/08/20 5:21 PM

## 2020-05-08 NOTE — Progress Notes (Signed)
Patient ID: Cody Arellano, male   DOB: 1945/08/13, 74 y.o.   MRN: [id number] Pat    Advanced Heart Failure Rounding Note  PCP-Cardiologist: Mertie Moores, MD   Subjective:    Impella 5.5 placed on 11/30 with cardiogenic shock.  Off norep and milrinone. Remains on Amio drip. Heparin switched to bival. HIT Panel back and is negative.  On Impella 5.5. P-7 (turned down from P-8 yesterday) Co-ox 60%. Remains in NSR.   CXR with possible PNA right lung.  Tm 99.9  He is on vancomycin/cefepime.   Feels ok. Appetite improved. No BM x 4 days. Denies SOB, orthopnea or PND.   PLTs 99 -> 102. He got 1u RBCs yesterday. hgb 7.4 -> 7.8   - Echo: EF 20-25% with septal akinesis, mid to apical inferior akinesis, apical anterior and apex akinesis.  Moderately dilated and dysfunctional RV.  Moderate TR.  Severe MR.  Bicuspid aortic valve with no stenosis, mild aortic insufficiency.  - LHC:  1. Dominant LCx, no significant disease in this vessel.  2. 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  The LIMA-LAD is also present.  3. The SVG-ramus has been occluded.  There is severe and diffuse in-stent restenosis in the proximal ramus.   Impella 5.5 P8  Flow 4.0L, no alarms LDH 235    PAP: (31-48)/(3-15) 34/8 CVP:  [2 mmHg-16 mmHg] 6 mmHg PCWP:  [9 mmHg-15 mmHg] 12 mmHg CO:  [4 L/min-5.9 L/min] 4.3 L/min CI:  [2.2 L/min/m2-3.3 L/min/m2] 2.3 L/min/m2    Objective:   Weight Range: 67.3 kg Body mass index is 20.12 kg/m.   Vital Signs:   Temp:  [99.1 F (37.3 C)-99.9 F (37.7 C)] 99.9 F (37.7 C) (12/05 1330) Pulse Rate:  [58-67] 63 (12/05 1330) Resp:  [15-25] 20 (12/05 1330) BP: (88)/(52) 88/52 (12/04 1636) SpO2:  [69 %-100 %] 98 % (12/05 1330) Arterial Line BP: (88-114)/(51-69) 97/56 (12/05 1330) Weight:  [67.3 kg] 67.3 kg (12/05 0100) Last BM Date: 05/01/20  Weight change: Filed Weights   05/06/20 0542 05/07/20 0645 05/08/20 0100  Weight: 64.3 kg 66.2 kg 67.3 kg     Intake/Output:   Intake/Output Summary (Last 24 hours) at 05/08/2020 1410 Last data filed at 05/08/2020 1330 Gross per 24 hour  Intake 2684.15 ml  Output 1575 ml  Net 1109.15 ml      Physical Exam    General:  Thin male. Sitting up in bed No resp difficulty HEENT: normal + [place]k Neck: supple. + RIJ swan  Carotids 2+ bilat; no bruits. No lymphadenopathy or thryomegaly appreciated. Cor: PMI nondisplaced. Regular rate & rhythm. No rubs, gallops or murmurs. Impella site ok Lungs: clear Abdomen: soft, nontender, nondistended. No hepatosplenomegaly. No bruits or masses. Good bowel sounds. Extremities: no cyanosis, clubbing, rash, edema + SCDs Neuro: alert & orientedx3, cranial nerves grossly intact. moves all 4 extremities w/o difficulty. Affect pleasant    Telemetry    Sinus 60s Personally reviewed   Labs    CBC Recent Labs    05/06/20 1654 05/06/20 1654 05/07/20 0424 05/08/20 0542  WBC 7.8   < > 7.1 6.7  NEUTROABS 6.3  --   --   --   HGB 7.9*   < > 7.4* 7.8*  HCT 25.7*   < > 23.1* 25.4*  MCV 85.7   < > 85.2 83.3  PLT 104*   < > 99* 102*   < > = values in this interval not displayed.   Basic Metabolic Panel  Recent Labs    05/07/20 0424 05/08/20 0542  NA 133* 132*  K 3.2* 3.7  CL 101 103  CO2 23 23  GLUCOSE 102* 117*  BUN 12 13  CREATININE 0.79 0.68  CALCIUM 8.0* 7.7*  MG 2.0 1.9  PHOS 2.1* 1.6*   Liver Function Tests Recent Labs    05/07/20 0424 05/08/20 0542  AST 68* 111*  ALT 36 62*  ALKPHOS 57 83  BILITOT 1.0 1.1  PROT 4.7* 4.8*  ALBUMIN 1.7* 1.7*   No results for input(s): LIPASE, AMYLASE in the last 72 hours. Cardiac Enzymes No results for input(s): CKTOTAL, CKMB, CKMBINDEX, TROPONINI in the last 72 hours.  BNP: BNP (last 3 results) Recent Labs    04/06/20 2134 04/26/20 1319 04/30/20 0248  BNP 1,677.6* 1,824.4* 1,685.5*    ProBNP (last 3 results) No results for input(s): PROBNP in the last 8760 hours.   D-Dimer No  results for input(s): DDIMER in the last 72 hours. Hemoglobin A1C No results for input(s): HGBA1C in the last 72 hours. Fasting Lipid Panel Recent Labs    05/06/20 0514  TRIG 49   Thyroid Function Tests No results for input(s): TSH, T4TOTAL, T3FREE, THYROIDAB in the last 72 hours.  Invalid input(s): FREET3  Other results:   Imaging    No results found.   Medications:     Scheduled Medications: . aspirin  81 mg Oral Daily  . chlorhexidine  15 mL Mouth Rinse BID  . Chlorhexidine Gluconate Cloth  6 each Topical Daily  . digoxin  0.125 mg Oral Daily  . docusate  100 mg Oral BID  . feeding supplement (PROSource TF)  45 mL Per Tube TID  . mouth rinse  15 mL Mouth Rinse q12n4p  . multivitamin with minerals  1 tablet Oral Daily  . rosuvastatin  20 mg Oral Daily  . sodium chloride flush  10-40 mL Intracatheter Q12H  . sodium chloride flush  3 mL Intravenous Q12H    Infusions: . sodium chloride Stopped (05/06/20 0858)  . sodium chloride    . sodium chloride 250 mL (05/08/20 0548)  . sodium chloride    . amiodarone 30 mg/hr (05/08/20 1300)  . bivalirudin (ANGIOMAX) infusion 0.5 mg/mL (Non-ACS indications) 0.0599 mg/kg/hr (05/08/20 1300)  . ceFEPime (MAXIPIME) IV 2 g (05/08/20 1324)  . dextrose Stopped (05/06/20 2218)  . dextrose 5 % Impella PURGE solution    . feeding supplement (VITAL 1.5 CAL) 1,000 mL (05/07/20 2207)  . lactated ringers 20 mL/hr at 05/08/20 1300  . norepinephrine (LEVOPHED) Adult infusion Stopped (05/07/20 0424)    PRN Medications: Place/Maintain arterial line **AND** sodium chloride, sodium chloride, acetaminophen (TYLENOL) oral liquid 160 mg/5 mL, bisacodyl, fentaNYL (SUBLIMAZE) injection, lidocaine, ondansetron (ZOFRAN) IV, oxyCODONE, polyethylene glycol, Resource ThickenUp Clear, sodium chloride flush, sodium chloride flush, white petrolatum   Assessment/Plan   1. 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  Echo  this admission looks worse than 10/21 with EF 20-25% with septal akinesis, mid to apical inferior akinesis, apical anterior and apex akinesis, moderately dilated and dysfunctional RV, moderate TR, severe MR, bicuspid aortic valve with no stenosis, mild aortic insufficiency. Progressive/end-stage CHF worsened by onset of atrial fibrillation. Cardiogenic shock 11/30 with co-ox down to 30%, Impella 5.5 placed to allow time to stabilize and consider further steps.  Currently with CI 2.3 and co-ox 60% on P7 Impella with no alarms. Waveforms look good.  - Off NE/milrinone.   - Impella position and waveforms stable. LDH stable  -  Volume status stable off diureitcs - Off milrinone and NE. CO-OX stable.   - Continue digoxin 0.125 with improving creatinine.  - Will continue slow Impella wean. Drop to P-6 - HIT negative. PLTs stable. Switch bival back to heparin. Discussed dosing with PharmD personally. - He has end-stage biventricular HF with cardiogenic shock physiology in the setting of AF/RVR and severe MR and advanced ischemic CM.  He now s/p Impella 5.5 to support DC-CV and medical optimization.[name] He is not currently an LVAD candidate. Now off inotropes. Maintaining NSR. Continue slow Impella wean and see if he can be managed medically. I turned down to P-6 and will follow. He continues to ask about home milrinone as an option and I said it would be an option but typically only provides short-term (weeks) benefit.  - Palliative Care has seen and continue to discuss E[facility] 2. CAD: Prior history of MI with LAD and ramus PCI. Admitted May 2021 with anterior STEMI. LHC with occluded ostial LAD (in-stent), 90% ostial ramus, 60-70% proximal LCx, nondominant RCA. PTCA to LAD and ramus to restore flow, then CABG with LIMA-LAD and SVG-ramus.  No chest pain, doubt ACS.  Fall in EF from 10/21 to 11/21, but he did not present with ACS.  [place] 12/1 showed patient LIMA-LAD, patent native LAD, patent dominant LCx.  The SVG-ramus  is occluded with severe diffuse in-stent restenosis in proximal ramus.  Ramus not intervened upon as this would be a complex intervention and unlikely to markedly improve EF.  - No s/s angina - Continue ASA 81 and statin.    3. AKI: Creatinine now trending down with improved cardiac output. Renal function normalized. Creatinine 0.68 today 4. Right pleural effusion: S/p thoracentesis, transudative (CHF).  5. Atrial fibrillation/flutter with RVR: Mild RVR this morning, he is on amiodarone gtt 30 mg/hr and heparin gtt. In atrial flutter.  -s/p DC-CV on 12/3 - maintaining NSR on IV amio. Will continue. Switching bival back to heparin.  6. Mitral regurgitation: - TEE 12/3 with moderate central MR - Doubt MitraClip will help him much with moderate MR and he is likely too advanced (Mitra-FR vc Coapt patient) 7. H/o PE: In 10/21.  Keep anticoagulated. Switchingbival back to heparin  8. Bicuspid aortic valve: Mild AI, no AS.  9. SLE: H/o pericarditis.  On Imuran and hydroxychloroquine on. Holding hydroxychloroquine for now with risk for QT prolongation.  10. Pulmonary nodules/emphysema: CCM following, s/p bronch/BAL (no growth).  11. ID: Concern for right-sided PNA, ?aspiration.  Tm 99.7, WBCs normal. PCT mildly elevated 0.61.  - Cultures NGTD.  - Treating empirically with cefepime. (vanc stopped) 12. Thrombocytopenia: Mild drop in plts to 97. Stable at 102 today  May be due to critical illness, low grade hemolysis with Impella. LDH ok - switch bival back to heparin 13. Hypokalemia/hypomag - supp  14. Acute blood loss anemia - no obvious site - hgb 7.4 -> 7.9. He got 1u RBC on 12/4. Will follow. Keep hgb > 7.5 Check iron stores  CRITICAL CARE Performed by: Glori Bickers  Total critical care time: 45 minutes  Critical care time was exclusive of separately billable procedures and treating other patients.  Critical care was necessary to treat or prevent imminent or life-threatening  deterioration.  Critical care was time spent personally by me (independent of midlevel providers or residents) on the following activities: development of treatment plan with patient and/or surrogate as well as nursing, discussions with consultants, evaluation of patient's response to treatment, examination of patient, obtaining  history from patient or surrogate, ordering and performing treatments and interventions, ordering and review of laboratory studies, ordering and review of radiographic studies, pulse oximetry and re-evaluation of patient's condition.     Length of Stay: 12  Glori Bickers, MD  05/08/2020, 2:10 PM  Advanced Heart Failure Team Pager [phone] (M-F; 7a - 4p)  Please contact [facility] for night-coverage after hours (4p -7a ) and weekends on [email or web address]

## 2020-05-08 NOTE — Progress Notes (Addendum)
ANTICOAGULATION CONSULT NOTE  Pharmacy Consult for Bivalirudin  Indication: Impella and hx PE  No Known Allergies  Patient Measurements: Height: 6' (182.9 cm) Weight: 67.3 kg (148 lb 5.9 oz) IBW/kg (Calculated) : 77.6 Heparin Dosing Weight: 60kg  Vital Signs: Temp: 99.9 F (37.7 C) (12/05 1400) Temp Source: Core (12/05 1200) Pulse Rate: 63 (12/05 1400)  Labs: Recent Labs    05/06/20 0514 05/06/20 0515 05/06/20 0807 05/06/20 1654 05/06/20 2223 05/07/20 0424 05/07/20 0820 05/08/20 0542 05/08/20 1240  HGB  --    < >  --  7.9*  --  7.4*  --  7.8*  --   HCT  --    < >  --  25.7*  --  23.1*  --  25.4*  --   PLT  --    < >  --  104*  --  99*  --  102*  --   APTT 68*   < >  --  95*   < > 69* 73*  --  76*  LABPROT  --   --  18.8*  --   --   --   --   --   --   INR  --   --  1.6*  --   --   --   --   --   --   HEPARINUNFRC 1.32*  --   --   --   --   --   --   --  0.98*  CREATININE 0.91  --   --   --   --  0.79  --  0.68  --    < > = values in this interval not displayed.    Estimated Creatinine Clearance: 77.1 mL/min (by C-G formula based on SCr of 0.68 mg/dL).   Medical History: Past Medical History:  Diagnosis Date  . Anxiety   . CHF (congestive heart failure) ([place])   . Chronic tension headache   . Colon polyps   . Coronary artery disease 2008/2009   MI with PCI x 2, then PCI x 1 in 2009  . Depression   . Dyslipidemia   . Dysrhythmia    atrial fibrillation  . Hyperlipidemia   . Hypertension   . Lupus [facility])    sees Dr Trudie Reed  . Lupus disease of the lung   . 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 Myocardial infarction ([place]) 2008  . 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 Shortness of breath     Assessment: 38 yoM with hx recent PE (03/2020) on apixaban PTA admitted with ADHF now s/p Impella placement.   APTT stable and therapeutic this morning, Pltc stable ~ 100.  On IV, systemic bivalirudin, no additional  anticoagulants are added to Impella purge solution.   Some fluctuations in Impella purge flow rate yesterday, discussed with Dr. Haroldine Laws, will switch to heparin and add to purge solution.  HIT antibody negative.  Heparin level still falsely elevated from recent Eliquis.  Goal of Therapy:  APTT 66-85 Monitor platelets by anticoagulation protocol: Yes   Plan:  Stop IV bivalirudin now. Start heparin in purge solution. Start heparin systemically at 200 units/hr for now. Check aPTT in 6 hrs. Daily heparin level, aPTT and CBC.  [place] Crane, Roylene Reason, BCCP Clinical Pharmacist  05/08/2020 2:39 PM   [facility] pharmacy phone numbers are listed on [email or web address]

## 2020-05-09 ENCOUNTER — Inpatient Hospital Stay (HOSPITAL_COMMUNITY): Payer: No Typology Code available for payment source

## 2020-05-09 DIAGNOSIS — Z95811 Presence of heart assist device: Secondary | ICD-10-CM | POA: Diagnosis not present

## 2020-05-09 DIAGNOSIS — R570 Cardiogenic shock: Secondary | ICD-10-CM | POA: Diagnosis not present

## 2020-05-09 DIAGNOSIS — I5023 Acute on chronic systolic (congestive) heart failure: Secondary | ICD-10-CM

## 2020-05-09 LAB — COMPREHENSIVE METABOLIC PANEL
ALT: 83 U/L — ABNORMAL HIGH (ref 0–44)
AST: 97 U/L — ABNORMAL HIGH (ref 15–41)
Albumin: 1.8 g/dL — ABNORMAL LOW (ref 3.5–5.0)
Alkaline Phosphatase: 90 U/L (ref 38–126)
Anion gap: 10 (ref 5–15)
BUN: 20 mg/dL (ref 8–23)
CO2: 21 mmol/L — ABNORMAL LOW (ref 22–32)
Calcium: 7.9 mg/dL — ABNORMAL LOW (ref 8.9–10.3)
Chloride: 103 mmol/L (ref 98–111)
Creatinine, Ser: 0.83 mg/dL (ref 0.61–1.24)
GFR, Estimated: >60 mL/min (ref >60)
Glucose, Bld: 165 mg/dL — ABNORMAL HIGH (ref 70–99)
Potassium: 3.9 mmol/L (ref 3.5–5.1)
Sodium: 134 mmol/L — ABNORMAL LOW (ref 135–145)
Total Bilirubin: 1.4 mg/dL — ABNORMAL HIGH (ref 0.3–1.2)
Total Protein: 5.4 g/dL — ABNORMAL LOW (ref 6.5–8.1)

## 2020-05-09 LAB — CBC
HCT: 24.6 % — ABNORMAL LOW (ref 39.0–52.0)
HCT: 29.2 % — ABNORMAL LOW (ref 39.0–52.0)
Hemoglobin: 7.5 g/dL — ABNORMAL LOW (ref 13.0–17.0)
Hemoglobin: 9.2 g/dL — ABNORMAL LOW (ref 13.0–17.0)
MCH: 25.6 pg — ABNORMAL LOW (ref 26.0–34.0)
MCH: 26.1 pg (ref 26.0–34.0)
MCHC: 30.5 g/dL (ref 30.0–36.0)
MCHC: 31.5 g/dL (ref 30.0–36.0)
MCV: 84 fL (ref 80.0–100.0)
MCV: 82.7 fL (ref 80.0–100.0)
Platelets: 113 10*3/uL — ABNORMAL LOW (ref 150–400)
Platelets: 142 10*3/uL — ABNORMAL LOW (ref 150–400)
RBC: 2.93 MIL/uL — ABNORMAL LOW (ref 4.22–5.81)
RBC: 3.53 MIL/uL — ABNORMAL LOW (ref 4.22–5.81)
RDW: 19.7 % — ABNORMAL HIGH (ref 11.5–15.5)
RDW: 18.8 % — ABNORMAL HIGH (ref 11.5–15.5)
WBC: 6.3 10*3/uL (ref 4.0–10.5)
WBC: 6.2 10*3/uL (ref 4.0–10.5)
nRBC: 0.3 % — ABNORMAL HIGH (ref 0.0–0.2)
nRBC: 1 % — ABNORMAL HIGH (ref 0.0–0.2)

## 2020-05-09 LAB — BASIC METABOLIC PANEL
Anion gap: 7 (ref 5–15)
BUN: 13 mg/dL (ref 8–23)
CO2: 23 mmol/L (ref 22–32)
Calcium: 7.8 mg/dL — ABNORMAL LOW (ref 8.9–10.3)
Chloride: 104 mmol/L (ref 98–111)
Creatinine, Ser: 0.75 mg/dL (ref 0.61–1.24)
GFR, Estimated: >60 mL/min (ref >60)
Glucose, Bld: 81 mg/dL (ref 70–99)
Potassium: 3.6 mmol/L (ref 3.5–5.1)
Sodium: 134 mmol/L — ABNORMAL LOW (ref 135–145)

## 2020-05-09 LAB — COOXEMETRY PANEL
Carboxyhemoglobin: 2.2 % — ABNORMAL HIGH (ref 0.5–1.5)
Carboxyhemoglobin: 2 % — ABNORMAL HIGH (ref 0.5–1.5)
Carboxyhemoglobin: 2.1 % — ABNORMAL HIGH (ref 0.5–1.5)
Methemoglobin: 0.7 % (ref 0.0–1.5)
Methemoglobin: 0.8 % (ref 0.0–1.5)
Methemoglobin: 1 % (ref 0.0–1.5)
O2 Saturation: 47.9 %
O2 Saturation: 40.8 %
O2 Saturation: 51.4 %
Total hemoglobin: 9.5 g/dL — ABNORMAL LOW (ref 12.0–16.0)
Total hemoglobin: 10.4 g/dL — ABNORMAL LOW (ref 12.0–16.0)
Total hemoglobin: 9.5 g/dL — ABNORMAL LOW (ref 12.0–16.0)

## 2020-05-09 LAB — GLUCOSE, CAPILLARY
Glucose-Capillary: 100 mg/dL — ABNORMAL HIGH (ref 70–99)
Glucose-Capillary: 117 mg/dL — ABNORMAL HIGH (ref 70–99)
Glucose-Capillary: 125 mg/dL — ABNORMAL HIGH (ref 70–99)
Glucose-Capillary: 79 mg/dL (ref 70–99)
Glucose-Capillary: 85 mg/dL (ref 70–99)
Glucose-Capillary: 93 mg/dL (ref 70–99)

## 2020-05-09 LAB — PHOSPHORUS: Phosphorus: 1.4 mg/dL — ABNORMAL LOW (ref 2.5–4.6)

## 2020-05-09 LAB — LACTATE DEHYDROGENASE: LDH: 240 U/L — ABNORMAL HIGH (ref 98–192)

## 2020-05-09 LAB — CULTURE, BLOOD (ROUTINE X 2)
Culture: NO GROWTH
Culture: NO GROWTH
Special Requests: ADEQUATE

## 2020-05-09 LAB — PREALBUMIN: Prealbumin: 7.1 mg/dL — ABNORMAL LOW (ref 18–38)

## 2020-05-09 LAB — ECHOCARDIOGRAM LIMITED
Height: 72 in
Weight: 2437.41 oz

## 2020-05-09 LAB — PREPARE RBC (CROSSMATCH)

## 2020-05-09 LAB — APTT
aPTT: 64 seconds — ABNORMAL HIGH (ref 24–36)
aPTT: 56 seconds — ABNORMAL HIGH (ref 24–36)

## 2020-05-09 LAB — HEPARIN LEVEL (UNFRACTIONATED): Heparin Unfractionated: 0.98 IU/mL — ABNORMAL HIGH (ref 0.30–0.70)

## 2020-05-09 LAB — IRON AND TIBC
Iron: 29 ug/dL — ABNORMAL LOW (ref 45–182)
Saturation Ratios: 11 % — ABNORMAL LOW (ref 17.9–39.5)
TIBC: 266 ug/dL (ref 250–450)
UIBC: 237 ug/dL

## 2020-05-09 LAB — MAGNESIUM: Magnesium: 2 mg/dL (ref 1.7–2.4)

## 2020-05-09 MED ORDER — FENTANYL CITRATE (PF) 100 MCG/2ML IJ SOLN
INTRAMUSCULAR | Status: AC
  Administered 2020-05-09: 50 ug
  Filled 2020-05-09: qty 2

## 2020-05-09 MED ORDER — MIDAZOLAM HCL 2 MG/2ML IJ SOLN
INTRAMUSCULAR | Status: AC
  Filled 2020-05-09: qty 2

## 2020-05-09 MED ORDER — MIDAZOLAM HCL 2 MG/2ML IJ SOLN
INTRAMUSCULAR | Status: AC
  Administered 2020-05-09: 2 mg
  Filled 2020-05-09: qty 2

## 2020-05-09 MED ORDER — SODIUM CHLORIDE 0.9 % IV SOLN
510.0000 mg | Freq: Once | INTRAVENOUS | Status: DC
  Filled 2020-05-09: qty 17

## 2020-05-09 MED ORDER — SODIUM CHLORIDE 0.9% IV SOLUTION: Freq: Once | INTRAVENOUS | Status: DC

## 2020-05-09 MED ORDER — SODIUM CHLORIDE 0.9 % IV SOLN: INTRAVENOUS | Status: DC

## 2020-05-09 MED ORDER — POTASSIUM CHLORIDE 20 MEQ PO PACK
40.0000 meq | PACK | Freq: Two times a day (BID) | ORAL | Status: DC
  Administered 2020-05-09 (×2): 40 meq
  Filled 2020-05-09 (×2): qty 2

## 2020-05-09 MED ORDER — FUROSEMIDE 10 MG/ML IJ SOLN
40.0000 mg | Freq: Once | INTRAMUSCULAR | Status: AC
  Administered 2020-05-09: 40 mg via INTRAVENOUS
  Filled 2020-05-09: qty 4

## 2020-05-09 MED ORDER — ENSURE ENLIVE PO LIQD
237.0000 mL | Freq: Two times a day (BID) | ORAL | Status: DC
  Administered 2020-05-09 – 2020-05-17 (×9): 237 mL via ORAL

## 2020-05-09 MED ORDER — SPIRONOLACTONE 12.5 MG HALF TABLET
12.5000 mg | ORAL_TABLET | Freq: Every day | ORAL | Status: DC
  Administered 2020-05-09 – 2020-05-15 (×7): 12.5 mg via ORAL
  Filled 2020-05-09 (×7): qty 1

## 2020-05-09 NOTE — Progress Notes (Signed)
  Speech Language Pathology Treatment: Dysphagia  Patient Details Name: Cody Arellano MRN: [id number] DOB: [birth year] Today's Date: 05/09/2020 Time: 4628-6381 SLP Time Calculation (min) (ACUTE ONLY): 12 min  Assessment / Plan / Recommendation Clinical Impression  Pt was seen for dysphagia treatment and was cooperative during the session. Pt and nursing reported that the pt has been tolerating the current diet without overt s/sx of aspiration, but that the pt does not like the thickened liquids. Pt stated that he has decided that today he is "just going to eat" the food despite it not being good. Vocal quality was WNL and pt stated that it was "pretty close" to his baseline. He tolerated regular texture solids and thin liquids via straw without symptoms of oropharyngeal dysphagia. Pt was asymptomatic of aspiration even when challenged with ~4 oz of water, suggesting improved swallow function. Pt's diet will be advanced to regular texture solids and thin liquids at this time. SLP will continue to follow pt.    HPI HPI: Pt is a 74 y.o. male with medical history significant for lupus, interstitial lung disease, coronary artery disease, chronic systolic CHF, paroxysmal atrial fibrillation on Eliquis, and recent PE who presented to the emergency department due to shortness of breath that has been ongoing for about 2 weeks associated with nausea and intermittent nonbilious, nonbloody vomiting ~2-3 times per week. Pt had cardiogenic shock and impella placed 11/30; heart cath on 12/1. CXR 11/30: Marked worsening of airspace disease throughout the right chest most worrisome for pneumonia. CXR 12/1: Significant interval clearing of airspace opacity on the right as well as milder clearing on the left. No new opacity evident. CXR 12/2: Stable bibasilar subsegmental atelectasis. ETT 11/30-12/2. CXR 12/4: Airspace opacities bilaterally with right worse than left. Small effusions are noted.      SLP Plan  Continue with  current plan of care       Recommendations  Diet recommendations: Regular;Thin liquid Liquids provided via: Cup;Straw Medication Administration: Whole meds with liquid Supervision: Patient able to self feed Compensations: Slow rate;Small sips/bites Postural Changes and/or Swallow Maneuvers: Seated upright 90 degrees                Oral Care Recommendations: Oral care BID Follow up Recommendations: None SLP Visit Diagnosis: Dysphagia, unspecified (R13.10) Plan: Continue with current plan of care       Tye Vigo I. Hardin Negus, [place], [place] Office number [phone] Pager [place] 05/09/2020, 9:22 AM

## 2020-05-09 NOTE — Progress Notes (Signed)
ANTICOAGULATION CONSULT NOTE  Pharmacy Consult for Heparin  Indication: Impella and hx PE  No Known Allergies  Patient Measurements: Height: 6' (182.9 cm) Weight: 69.1 kg (152 lb 5.4 oz) IBW/kg (Calculated) : 77.6 Heparin Dosing Weight: 60kg  Vital Signs: Temp: 99.5 F (37.5 C) (12/06 1500) Temp Source: Core (12/06 0845) Pulse Rate: 59 (12/06 1500)  Labs: Recent Labs    05/07/20 0424 05/07/20 0820 05/08/20 0542 05/08/20 1240 05/08/20 2223 05/09/20 0457 05/09/20 0740  HGB 7.4*   < > 7.8*  --   --  7.5*  --   HCT 23.1*  --  25.4*  --   --  24.6*  --   PLT 99*  --  102*  --   --  113*  --   APTT 69*   < >  --  76* 69* 64*  --   HEPARINUNFRC  --   --   --  0.98*  --  0.98*  --   CREATININE 0.79  --  0.68  --   --   --  0.75   < > = values in this interval not displayed.    Estimated Creatinine Clearance: 79.2 mL/min (by C-G formula based on SCr of 0.75 mg/dL).   Assessment: 74 yo Male with hx recent PE (03/2020) on apixaban PTA admitted with ADHF now s/p Impella placement, for heparin.   RN reported issues with Impella purge flow rate yesterday but those have resolved.  Purge flow back to 49ml/hr = 600 uts/hr and systemic heparin 200 uts/hr, total heparin 800 uts/hr aptt 64sec   APTT just below goal range H/h low replace prbc pltc improving no over bleeding - some oozing at insertion site  Goal of Therapy:  APTT 66-85 Monitor platelets by anticoagulation protocol: Yes   Plan:  Continue Heparin at current rate  Recheck aptt this evening and increase systemic heparin slightly if aptt still low  Bonnita Nasuti Pharm.D. CPP, BCPS Clinical Pharmacist [phone] 05/09/2020 3:37 PM

## 2020-05-09 NOTE — Progress Notes (Signed)
ANTICOAGULATION CONSULT NOTE  Pharmacy Consult for Heparin  Indication: Impella and hx PE  No Known Allergies  Patient Measurements: Height: 6' (182.9 cm) Weight: 69.1 kg (152 lb 5.4 oz) IBW/kg (Calculated) : 77.6 Heparin Dosing Weight: 60kg  Vital Signs: Temp: 99.5 F (37.5 C) (12/06 1800) Temp Source: Core (12/06 0845) Pulse Rate: 58 (12/06 1800)  Labs: Recent Labs    05/07/20 0424 05/07/20 0820 05/08/20 0542 05/08/20 1240 05/08/20 1240 05/08/20 2223 05/09/20 0457 05/09/20 0740 05/09/20 1609  HGB 7.4*   < > 7.8*  --   --   --  7.5*  --   --   HCT 23.1*  --  25.4*  --   --   --  24.6*  --   --   PLT 99*  --  102*  --   --   --  113*  --   --   APTT 69*   < >  --  76*   < > 69* 64*  --  56*  HEPARINUNFRC  --   --   --  0.98*  --   --  0.98*  --   --   CREATININE 0.79  --  0.68  --   --   --   --  0.75  --    < > = values in this interval not displayed.    Estimated Creatinine Clearance: 79.2 mL/min (by C-G formula based on SCr of 0.75 mg/dL).   Assessment: 74 yo Male with hx recent PE (03/2020) on apixaban PTA admitted with ADHF now s/p Impella placement, for heparin.   RN reported issues with Impella purge flow rate yesterday but those have resolved.  Purge flow back to 61ml/hr = 600 uts/hr and systemic heparin 200 uts/hr, total heparin 800 uts/hr aptt 64sec     APTT below goal range  Goal of Therapy:  APTT 66-85 Monitor platelets by anticoagulation protocol: Yes   Plan:  Increase systemic Heparin to 300 units/hr  Recheck aptt in am  Alanda Slim, PharmD, [facility] Clinical Pharmacist Please see AMION for all Pharmacists' Contact Phone Numbers 05/09/2020, 6:23 PM

## 2020-05-09 NOTE — Progress Notes (Signed)
I was called to the bedside this evening for unstable ventricular tachycardia. On review of telemetry, it appears this was initially polymorphic VT with a pause-dependent initiation that subsequently organized to monomorphic VT at rates from 240-260 bpm. He was on amiodarone at 30 mg/hr and Impella 5.5 support at p6 at the time. He had minimal pulsatility on arterial waveform with MAP of about 50, but remained awake and responsive. Started low-dose Levophed and increased Impella speed to p8 with immediate improvement in his hemodynamics. Adequately sedated with fentanyl and Versed and successfully cardioverted on first attempt to sinus rhythm and marked improvements in his hemodynamics. CVP also slightly up to about 15, so decreased Impella back to P6. Amidarone 150mg  bolus given and infusion increased to 60 mg/hr. Will obtain 12-lead ECG, check electrolytes, and wean off norepinephrine. Dr. Marigene Ehlers updated per request.

## 2020-05-09 NOTE — Progress Notes (Signed)
Mr. Caras had an episode of VT with HR increasing to >250 bpm. The RN immediately went to the bedside. He was hypotensive, but was also alert and able to talk to the RN throughout the event. Impella was continuing without any issues during this event and was measured at the same cm at the site. Defib pads were placed on Mr. Gumz were hooked up to a defibrillator (Z[facility]). Dr. Einar Crow was called and gave a verbal order to bolus with Amiodarone and increase his current gtt to 61. Dr. Aundra Dubin also informed us to contact the cardiac fellow to have him cardiovert Mr. Fluharty ASAP. Dr. Alene Mires was paged to the bedside and sedation meds were prepared. He was then cardioverted x 1 and converted to his previous baseline of sinus bradycardia. Impella placement was then verified by Dr. Kalman Shan via ultrasound at the bedside. Dr. Kalman Shan also updated Dr. Aundra Dubin on the results of the events.

## 2020-05-09 NOTE — Progress Notes (Signed)
  Echocardiogram 2D Echocardiogram has been performed.  Cody Arellano 05/09/2020, 10:23 AM

## 2020-05-09 NOTE — Procedures (Addendum)
Cardioversion Procedure Note  Procedure: synchronized electrical cardioversion  Indication: unstable ventricular tachycardia  Sedation: fentanyl 50 mcg, midazolam 2mg   Description: Due to the emergent nature of the arrhythmia, verbal consent was obtained prior to proceeding with the procedure. A time out was performed and the patient was adequately sedated for the procedure, as noted above. Blood pressure, continuous ECG, and respiratory status was monitored throughout the procedure. Synchronized electrical cardioversion was performed at 150 J with successful restoration of sinus rhythm on the first attempt. He tolerated the procedure well and hemodynamics improved. No complications.

## 2020-05-09 NOTE — Progress Notes (Signed)
Patient ID: Cody Arellano, male   DOB: 09-07-1945, 74 y.o.   MRN: [id number] Pat    Advanced Heart Failure Rounding Note  PCP-Cardiologist: Mertie Moores, MD   Subjective:    Impella 5.5 placed on 11/30 with cardiogenic shock.  Off norep and milrinone. Remains on Amio drip. Back on heparin systemically and in purge.  When on bivalirudin systemically, purge flow dropped significantly.  Improved back on heparin gtt.    CXR with possible PNA right lung.  Tm 99.9  He is on cefepime.   Feels good this morning.  Eating about half of meals and has TFs running via [place].   PLTs 99 -> 102 -> 113.  HIT negative. He got 1u RBCs 12/4. hgb 7.4 -> 7.8 -> 7.5  TEE-guided DCCV on 12/3.  Echo with mildly dilated LV, EF 20%, mildly dilated RV with moderately decreased systolic function, moderate MR.   - Echo: EF 20-25% with septal akinesis, mid to apical inferior akinesis, apical anterior and apex akinesis.  Moderately dilated and dysfunctional RV.  Moderate TR.  Severe MR.  Bicuspid aortic valve with no stenosis, mild aortic insufficiency.  - LHC:  1. Dominant LCx, no significant disease in this vessel.  2. 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  The LIMA-LAD is also present.  3. The SVG-ramus has been occluded.  There is severe and diffuse in-stent restenosis in the proximal ramus.   Impella 5.5 P6  Flow 3.6L, no alarms LDH 240  [place] numbers: CVP 7 PA 48/15 PCWP 10 CI 2.4 PAPI 4.7 Co-ox 48%  Objective:   Weight Range: 69.1 kg Body mass index is 20.66 kg/m.   Vital Signs:   Temp:  [98.6 F (37 C)-100 F (37.8 C)] 99 F (37.2 C) (12/06 0700) Pulse Rate:  [54-64] 57 (12/06 0700) Resp:  [16-28] 20 (12/06 0700) SpO2:  [87 %-100 %] 96 % (12/06 0700) Arterial Line BP: (93-114)/(45-69) 101/53 (12/06 0700) Weight:  [69.1 kg] 69.1 kg (12/06 0500) Last BM Date: 05/01/20  Weight change: Filed Weights   05/07/20 0645 05/08/20 0100 05/09/20 0500  Weight: 66.2 kg  67.3 kg 69.1 kg    Intake/Output:   Intake/Output Summary (Last 24 hours) at 05/09/2020 0757 Last data filed at 05/09/2020 0500 Gross per 24 hour  Intake 1585.18 ml  Output 990 ml  Net 595.18 ml      Physical Exam    General: Thin, NAD Neck: No JVD, no thyromegaly or thyroid nodule.  Lungs: Clear to auscultation bilaterally with normal respiratory effort. CV: Lateral PMI.  Heart regular S1/S2, no S3/S4, 2/6 HSM apex.  No peripheral edema.   Abdomen: Soft, nontender, no hepatosplenomegaly, no distention.  Skin: Intact without lesions or rashes.  Neurologic: Alert and oriented x 3.  Psych: Normal affect. Extremities: No clubbing or cyanosis.  HEENT: Normal.    Telemetry    Sinus 60s Personally reviewed   Labs    CBC Recent Labs    05/06/20 1654 05/07/20 0424 05/08/20 0542 05/09/20 0457  WBC 7.8   < > 6.7 6.3  NEUTROABS 6.3  --   --   --   HGB 7.9*   < > 7.8* 7.5*  HCT 25.7*   < > 25.4* 24.6*  MCV 85.7   < > 83.3 84.0  PLT 104*   < > 102* 113*   < > = values in this interval not displayed.   Basic Metabolic Panel Recent Labs    05/07/20 0424 05/08/20 0542  NA 133* 132*  K 3.2* 3.7  CL 101 103  CO2 23 23  GLUCOSE 102* 117*  BUN 12 13  CREATININE 0.79 0.68  CALCIUM 8.0* 7.7*  MG 2.0 1.9  PHOS 2.1* 1.6*   Liver Function Tests Recent Labs    05/07/20 0424 05/08/20 0542  AST 68* 111*  ALT 36 62*  ALKPHOS 57 83  BILITOT 1.0 1.1  PROT 4.7* 4.8*  ALBUMIN 1.7* 1.7*   No results for input(s): LIPASE, AMYLASE in the last 72 hours. Cardiac Enzymes No results for input(s): CKTOTAL, CKMB, CKMBINDEX, TROPONINI in the last 72 hours.  BNP: BNP (last 3 results) Recent Labs    04/06/20 2134 04/26/20 1319 04/30/20 0248  BNP 1,677.6* 1,824.4* 1,685.5*    ProBNP (last 3 results) No results for input(s): PROBNP in the last 8760 hours.   D-Dimer No results for input(s): DDIMER in the last 72 hours. Hemoglobin A1C No results for input(s): HGBA1C in  the last 72 hours. Fasting Lipid Panel No results for input(s): CHOL, HDL, LDLCALC, TRIG, CHOLHDL, LDLDIRECT in the last 72 hours. Thyroid Function Tests No results for input(s): TSH, T4TOTAL, T3FREE, THYROIDAB in the last 72 hours.  Invalid input(s): FREET3  Other results:   Imaging    No results found.   Medications:     Scheduled Medications: . sodium chloride   Intravenous Once  . aspirin  81 mg Oral Daily  . chlorhexidine  15 mL Mouth Rinse BID  . Chlorhexidine Gluconate Cloth  6 each Topical Daily  . digoxin  0.125 mg Oral Daily  . docusate  100 mg Oral BID  . feeding supplement  237 mL Oral BID BM  . feeding supplement (PROSource TF)  45 mL Per Tube TID  . furosemide  40 mg Intravenous Once  . mouth rinse  15 mL Mouth Rinse q12n4p  . multivitamin with minerals  1 tablet Oral Daily  . rosuvastatin  20 mg Oral Daily  . sodium chloride flush  10-40 mL Intracatheter Q12H  . sodium chloride flush  3 mL Intravenous Q12H  . spironolactone  12.5 mg Oral Daily    Infusions: . sodium chloride Stopped (05/06/20 0858)  . sodium chloride    . sodium chloride 250 mL (05/08/20 0548)  . sodium chloride    . amiodarone 30 mg/hr (05/08/20 2308)  . ceFEPime (MAXIPIME) IV 2 g (05/09/20 0646)  . dextrose Stopped (05/06/20 2218)  . feeding supplement (VITAL 1.5 CAL) 1,000 mL (05/07/20 2207)  . ferumoxytol    . ferumoxytol    . heparin 200 Units/hr (05/08/20 1630)  . impella catheter heparin 50 unit/mL in dextrose 5%    . lactated ringers 20 mL/hr at 05/08/20 2000  . norepinephrine (LEVOPHED) Adult infusion Stopped (05/07/20 0424)    PRN Medications: Place/Maintain arterial line **AND** sodium chloride, sodium chloride, acetaminophen (TYLENOL) oral liquid 160 mg/5 mL, bisacodyl, fentaNYL (SUBLIMAZE) injection, lidocaine, ondansetron (ZOFRAN) IV, oxyCODONE, polyethylene glycol, Resource ThickenUp Clear, sodium chloride flush, sodium chloride flush, white  petrolatum   Assessment/Plan   1. 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  Echo this admission looks worse than 10/21 with EF 20-25% with septal akinesis, mid to apical inferior akinesis, apical anterior and apex akinesis, moderately dilated and dysfunctional RV, moderate TR, severe MR, bicuspid aortic valve with no stenosis, mild aortic insufficiency. Progressive/end-stage CHF worsened by onset of atrial fibrillation. Cardiogenic shock 11/30 with co-ox down to 30%, Impella 5.5 placed to allow time to stabilize and consider  further steps.  Currently with CI 2.4 and though co-ox lower at 48% on P6 Impella with no alarms. Waveforms look good.  Low flow in purge while on bivalirudin systemically, now back on heparin gtt systemically and in purge and resolved. CVP 7 today. LDH stable, no evidence for significant hemolysis.  - Off NE/milrinone.   - Check position of Impella by echo today, also reassess RV function.  - Volume status stable off diuretics => will give Lasix with 1 unit PRBCs today.  - Continue digoxin 0.125 with improving creatinine.  - Add spironolactone 12.5 daily.  - Resend co-ox, if ok will decrease Impella to P5.  - He has end-stage biventricular HF with cardiogenic shock physiology in the setting of AF/RVR and severe MR and advanced ischemic CM.  He now s/p Impella 5.5 to support DC-CV and medical optimization.  Now off inotropes. Maintaining NSR. He is not a transplant candidate.  Barriers to LVAD include nutritional status and RV.  RV has looked better on last echoes though moderately hypokinetic and PAPI has been ok (4.7 today).  He is getting tube feeds and will add Ensure.  Will have him formally assessed for LVAD.  Will need to mobilize (walk with Impella in today).  May need to try to wean Impella to milrinone then allow time to improve nutritional status.  - Palliative Care has seen and continue to discuss [place] 2. CAD: Prior history of MI with LAD  and ramus PCI. Admitted May 2021 with anterior STEMI. LHC with occluded ostial LAD (in-stent), 90% ostial ramus, 60-70% proximal LCx, nondominant RCA. PTCA to LAD and ramus to restore flow, then CABG with LIMA-LAD and SVG-ramus.  No chest pain, doubt ACS.  Fall in EF from 10/21 to 11/21, but he did not present with ACS.  [place] 12/1 showed patient LIMA-LAD, patent native LAD, patent dominant LCx.  The SVG-ramus is occluded with severe diffuse in-stent restenosis in proximal ramus.  Ramus not intervened upon as this would be a complex intervention and unlikely to markedly improve EF. No chest pain.  - Continue ASA 81 and statin.    3. AKI: Creatinine now trending down with improved cardiac output. Renal function normalized. Pending BMET today.  4. Right pleural effusion: S/p thoracentesis, transudative (CHF).  5. Atrial fibrillation/flutter with RVR: DCCV on 12/3, maintaining NSR. - Continue IV amiodarone.  - Back on IV heparin.  6. Mitral regurgitation:  TEE 12/3 with moderate central MR.  Doubt MitraClip will help him much with moderate MR and he is likely too advanced.  7. H/o PE: In 10/21.  Keep anticoagulated, on heparin gtt.  8. Bicuspid aortic valve: Mild AI, no AS.  9. SLE: H/o pericarditis.  On Imuran and hydroxychloroquine on. Holding hydroxychloroquine for now with risk for QT prolongation.  10. Pulmonary nodules/emphysema: CCM following, s/p bronch/BAL (no growth).  11. ID: Concern for right-sided PNA, ?aspiration.  Tm 99.9, WBCs normal. PCT mildly elevated 0.61. Cultures NGTD.  - Treating empirically with cefepime (vanc stopped).  12. Thrombocytopenia: Stable 113 today.  May be due to critical illness, low grade hemolysis with Impella. LDH ok.  HIT negative.  - Back on heparin gtt.  13. Hypokalemia/hypomag: Supplement as needed.  14. Acute blood loss anemia: No obvious site.  Looks like less swelling at Impella site.  Hgb 7.5 today.  - Will transfuse 1 unit PRBCs.  - Will give feraheme.    CRITICAL CARE Performed by: Loralie Champagne  Total critical care time: 40 minutes  Critical care time was exclusive of separately billable procedures and treating other patients.  Critical care was necessary to treat or prevent imminent or life-threatening deterioration.  Critical care was time spent personally by me (independent of midlevel providers or residents) on the following activities: development of treatment plan with patient and/or surrogate as well as nursing, discussions with consultants, evaluation of patient's response to treatment, examination of patient, obtaining history from patient or surrogate, ordering and performing treatments and interventions, ordering and review of laboratory studies, ordering and review of radiographic studies, pulse oximetry and re-evaluation of patient's condition.     Length of Stay: 29  Loralie Champagne, MD  05/09/2020, 7:57 AM  Advanced Heart Failure Team Pager [phone] (M-F; 7a - 4p)  Please contact [facility] for night-coverage after hours (4p -7a ) and weekends on [email or web address]

## 2020-05-09 NOTE — Progress Notes (Signed)
Occupational Therapy Treatment Patient Details Name: Cody Arellano MRN: [id number] DOB: 03-31-1946 Today's Date: 05/09/2020    History of present illness Pt is a 74 y.o. male with medical history significant for lupus, interstitial lung disease, coronary artery disease, chronic systolic CHF, paroxysmal atrial fibrillation on Eliquis, and recent PE who presented to the emergency department due to shortness of breath that has been ongoing for about 2 weeks associated with nausea and intermittent nonbilious, nonbloody vomiting ~2-3 times per week. Pt had cardiogenic shock and impella placed 11/30; heart cath on 12/1. CXR 11/30: Marked worsening of airspace disease throughout the right chest most worrisome for pneumonia. CXR 12/1: Significant interval clearing of airspace opacity on the right as well as milder clearing on the left. No new opacity evident. CXR 12/2: Stable bibasilar subsegmental atelectasis. ETT 11/30-12/2.     OT comments  Pt progressing towards established OT goals. Pt requiring Min A +2 for bed mobility. Pt performing functional mobility with Mod A +2 presenting with poor coordination, strength, and balance. Pt with bil knee buckling during short distance mobility to recliner. Pt also with decreased activity tolerance as seen by fatigue and shortness of breath; VSS throughout. RN present throughout session. Update dc recommendation to CIR for intensive OT as pt is very motivated, has good family support, and present with a change in functional performance and safety. Will continue to follow acutely as admitted.    Follow Up Recommendations  CIR;Supervision/Assistance - 24 hour    Equipment Recommendations  3 in 1 bedside commode;Wheelchair (measurements OT);Wheelchair cushion (measurements OT)    Recommendations for Other Services      Precautions / Restrictions Precautions Precautions: Fall Precaution Comments: Impella, condom cath, [place] Restrictions Weight Bearing  Restrictions: No       Mobility Bed Mobility Overal bed mobility: Needs Assistance Bed Mobility: Rolling;Sidelying to Sit Rolling: Min assist;+2 for physical assistance Sidelying to sit: Min assist;+2 for physical assistance       General bed mobility comments: Pt needed min assist of 2 for lines managment and a little assist for trunk elevation.  Nurse watched Impella lines.   Transfers Overall transfer level: Needs assistance Equipment used: 2 person hand held assist Transfers: Sit to/from [facility] Sit to Stand: Mod assist;+2 physical assistance Stand pivot transfers: Mod assist;+2 physical assistance       General transfer comment: Pt with mod assist to stand and incr time to stand fully upright with bil knee instability and posterior lean initially. Pt was able to obtain balance and take pivotal steps to chair with mod assist of 2 taking incr time and needing support on bil UEs as well as asssist to weight shift.      Balance Overall balance assessment: Needs assistance Sitting-balance support: Feet supported;Bilateral upper extremity supported;No upper extremity supported Sitting balance-Leahy Scale: Poor Sitting balance - Comments: Pt min assist to min guard assist to sit EOB.  Postural control: Posterior lean Standing balance support: Bilateral upper extremity supported;During functional activity Standing balance-Leahy Scale: Poor Standing balance comment: relies on UE support for balance and +2 mod assist.                            ADL either performed or assessed with clinical judgement   ADL Overall ADL's : Needs assistance/impaired                         Toilet  Transfer: Moderate assistance;+2 for physical assistance;+2 for safety/equipment;Stand-pivot;Cueing for sequencing;Cueing for safety Toilet Transfer Details (indicate cue type and reason): Mod A +2 for power up and to gain balance. Pt with poor coorindation and Bil  knee buckling with steps.          Functional mobility during ADLs: Moderate assistance;+2 for physical assistance;+2 for safety/equipment General ADL Comments: Pt with poor balance, strength, and activity tolerance. Pt performing stand pivot to recliner. Presenting with increased fatigue     Vision       Perception     Praxis      Cognition Arousal/Alertness: Awake/alert Behavior During Therapy: WFL for tasks assessed/performed Overall Cognitive Status: Within Functional Limits for tasks assessed                                 General Comments: Requiring increased time        Exercises General Exercises - Lower Extremity Long Arc Quad: AROM;Both;10 reps;Seated Hip Flexion/Marching: AROM;Both;10 reps;Seated   Shoulder Instructions       General Comments VSS during transfer    Pertinent Vitals/ Pain       Pain Assessment: Faces Faces Pain Scale: Hurts little more Pain Location: R arm Pain Descriptors / Indicators: Grimacing;Guarding Pain Intervention(s): Monitored during session;Limited activity within patient's tolerance;Repositioned  Home Living                                          Prior Functioning/Environment              Frequency  Min 2X/week        Progress Toward Goals  OT Goals(current goals can now be found in the care plan section)  Progress towards OT goals: Progressing toward goals  Acute Rehab OT Goals Patient Stated Goal: Hoping to go home OT Goal Formulation: With patient Time For Goal Achievement: 05/20/20 Potential to Achieve Goals: Fair ADL Goals Pt Will Perform Grooming: with supervision;sitting Pt Will Perform Upper Body Bathing: with supervision;sitting Pt Will Perform Upper Body Dressing: with supervision;sitting Pt Will Transfer to Toilet: with min assist;stand pivot transfer;bedside commode  Plan Discharge plan remains appropriate    Co-evaluation    PT/OT/SLP  Co-Evaluation/Treatment: Yes Reason for Co-Treatment: For patient/therapist safety;To address functional/ADL transfers PT goals addressed during session: Mobility/safety with mobility OT goals addressed during session: ADL's and self-care      AM-PAC OT "6 Clicks" Daily Activity     Outcome Measure   Help from another person eating meals?: None Help from another person taking care of personal grooming?: A Little Help from another person toileting, which includes using toliet, bedpan, or urinal?: A Lot Help from another person bathing (including washing, rinsing, drying)?: A Lot Help from another person to put on and taking off regular upper body clothing?: A Lot Help from another person to put on and taking off regular lower body clothing?: A Lot 6 Click Score: 15    End of Session    OT Visit Diagnosis: Other abnormalities of gait and mobility (R26.89);Muscle weakness (generalized) (M62.81);Pain Pain - Right/Left: Right Pain - part of body: Arm   Activity Tolerance Patient tolerated treatment well   Patient Left in chair;with call bell/phone within reach;with nursing/sitter in room;with family/visitor present   Nurse Communication Mobility status  Time: 4174-0814 OT Time Calculation (min): 15 min  Charges: OT General Charges $OT Visit: 1 Visit  [place], OTR/L Acute Rehab Pager: [phone] Office: [place] 05/09/2020, 4:36 PM

## 2020-05-09 NOTE — Progress Notes (Signed)
  VAD Coordinator met with patient and wife per Dr. Claris Gladden request to begin VAD education and possible   VAD evaluation.   Pt reports he had open heart surgery May 2021, followed by rectal surgery for abscess in June 2021. He reports he has "not been doing well" since June, 2021. He says he did not fully recover from open heart surgery, but was "getting there" prior to rectal surgery.   He reports multiple ED visits for volume overload over last few months. He says his activity level has been very sedentary since June and has been unable to "do much of anything". Wife agrees with same.   Pt says he wants to live longer and would be willing to undergo VAD implant. Explained barriers to second cardiac surgery including overall frailty, poor nutritional status, and generalized weakness. Wife expresses concern that patient would survive another surgery at this time. She also expresses concern that he would ever become independent in daily living again. Explained Dr. Aundra Dubin will discuss Cody Arellano at [facility] later this afternoon for VAD team discussion. Will hold off on any evaluation until after [facility]B discussion/decision. Both verbalized understanding of same.                                 Zada Girt, RN VAD Coordinator   Office: [phone] 24/7 VAD Pager: [phone]

## 2020-05-09 NOTE — Progress Notes (Signed)
Physical Therapy Treatment Patient Details Name: Cody Arellano MRN: [id number] DOB: 1945/06/05 Today's Date: 05/09/2020    History of Present Illness Pt is a 74 y.o. male with medical history significant for lupus, interstitial lung disease, coronary artery disease, chronic systolic CHF, paroxysmal atrial fibrillation on Eliquis, and recent PE who presented to the emergency department due to shortness of breath that has been ongoing for about 2 weeks associated with nausea and intermittent nonbilious, nonbloody vomiting ~2-3 times per week. Pt had cardiogenic shock and impella placed 11/30; heart cath on 12/1. CXR 11/30: Marked worsening of airspace disease throughout the right chest most worrisome for pneumonia. CXR 12/1: Significant interval clearing of airspace opacity on the right as well as milder clearing on the left. No new opacity evident. CXR 12/2: Stable bibasilar subsegmental atelectasis. ETT 11/30-12/2.      PT Comments    Pt admitted with above diagnosis. Pt was able to stand and pivot to the chair with +2 mod assist for stability as pt is weak and with bil knee instability noted with transfer.  Pt tolerated fairly well and plan to progress pt as he tolerates.   Pt currently with functional limitations due to balance and endurance deficits. Pt will benefit from skilled PT to increase their independence and safety with mobility to allow discharge to the venue listed below.    Follow Up Recommendations  CIR;Supervision/Assistance - 24 hour     Equipment Recommendations  Rolling walker with 5" wheels;3in1 (PT)    Recommendations for Other Services       Precautions / Restrictions Precautions Precautions: Fall Precaution Comments: Impella, condom cath, [place] Restrictions Weight Bearing Restrictions: No    Mobility  Bed Mobility Overal bed mobility: Needs Assistance Bed Mobility: Rolling;Sidelying to Sit Rolling: Min assist;+2 for physical assistance Sidelying to sit: Min  assist;+2 for physical assistance       General bed mobility comments: Pt needed min assist of 2 for lines managment and a little assist for trunk elevation.  Nurse watched Impella lines.   Transfers Overall transfer level: Needs assistance Equipment used: 2 person hand held assist Transfers: Sit to/from [facility] Sit to Stand: Mod assist;+2 physical assistance Stand pivot transfers: Mod assist;+2 physical assistance       General transfer comment: Pt with mod assist to stand and incr time to stand fully upright with bil knee instability and posterior lean initially. Pt was able to obtain balance and take pivotal steps to chair with mod assist of 2 taking incr time and needing support on bil UEs as well as asssist to weight shift.    Ambulation/Gait             General Gait Details: TBA later date   Stairs             Wheelchair Mobility    Modified Rankin (Stroke Patients Only)       Balance Overall balance assessment: Needs assistance Sitting-balance support: Feet supported;Bilateral upper extremity supported;No upper extremity supported Sitting balance-Leahy Scale: Poor Sitting balance - Comments: Pt min assist to min guard assist to sit EOB.  Postural control: Posterior lean Standing balance support: Bilateral upper extremity supported;During functional activity Standing balance-Leahy Scale: Poor Standing balance comment: relies on UE support for balance and +2 mod assist.                             Cognition Arousal/Alertness: Awake/alert Behavior During Therapy:  WFL for tasks assessed/performed Overall Cognitive Status: Within Functional Limits for tasks assessed                                        Exercises General Exercises - Lower Extremity Long Arc Quad: AROM;Both;10 reps;Seated Hip Flexion/Marching: AROM;Both;10 reps;Seated    General Comments General comments (skin integrity, edema, etc.):  VSS during transfer      Pertinent Vitals/Pain Pain Assessment: Faces Faces Pain Scale: Hurts little more Pain Location: R arm Pain Descriptors / Indicators: Grimacing;Guarding Pain Intervention(s): Limited activity within patient's tolerance;Monitored during session;Repositioned    Home Living                      Prior Function            PT Goals (current goals can now be found in the care plan section) Acute Rehab PT Goals Patient Stated Goal: Hoping to go home Progress towards PT goals: Progressing toward goals    Frequency    Min 3X/week      PT Plan Current plan remains appropriate    Co-evaluation PT/OT/SLP Co-Evaluation/Treatment: Yes Reason for Co-Treatment: Complexity of the patient's impairments (multi-system involvement);For patient/therapist safety PT goals addressed during session: Mobility/safety with mobility        AM-PAC PT "6 Clicks" Mobility   Outcome Measure  Help needed turning from your back to your side while in a flat bed without using bedrails?: A Little Help needed moving from lying on your back to sitting on the side of a flat bed without using bedrails?: A Lot Help needed moving to and from a bed to a chair (including a wheelchair)?: A Lot Help needed standing up from a chair using your arms (e.g., wheelchair or bedside chair)?: A Lot Help needed to walk in hospital room?: Total Help needed climbing 3-5 steps with a railing? : Total 6 Click Score: 11    End of Session Equipment Utilized During Treatment: Gait belt Activity Tolerance: Patient limited by fatigue Patient left: with call bell/phone within reach;with family/visitor present;in chair;with chair alarm set Nurse Communication: Mobility status PT Visit Diagnosis: Muscle weakness (generalized) (M62.81)     Time: 1103-1594 PT Time Calculation (min) (ACUTE ONLY): 15 min  Charges:  $Therapeutic Activity: 8-22 mins                     Jadd Gasior W,PT Acute  Rehabilitation Services Pager:  [phone]  Office:  [place] 05/09/2020, 2:41 PM

## 2020-05-10 ENCOUNTER — Inpatient Hospital Stay (HOSPITAL_COMMUNITY): Payer: No Typology Code available for payment source

## 2020-05-10 DIAGNOSIS — Z95811 Presence of heart assist device: Secondary | ICD-10-CM

## 2020-05-10 DIAGNOSIS — I5043 Acute on chronic combined systolic (congestive) and diastolic (congestive) heart failure: Secondary | ICD-10-CM

## 2020-05-10 DIAGNOSIS — R570 Cardiogenic shock: Secondary | ICD-10-CM | POA: Diagnosis not present

## 2020-05-10 LAB — HEPARIN LEVEL (UNFRACTIONATED): Heparin Unfractionated: 0.95 IU/mL — ABNORMAL HIGH (ref 0.30–0.70)

## 2020-05-10 LAB — BPAM RBC
Blood Product Expiration Date: 202201012359
Blood Product Expiration Date: 202201022359
ISSUE DATE / TIME: 202112041620
ISSUE DATE / TIME: 202112060824
Unit Type and Rh: 6200
Unit Type and Rh: 6200

## 2020-05-10 LAB — TYPE AND SCREEN
ABO/RH(D): A POS
Antibody Screen: NEGATIVE
Unit division: 0
Unit division: 0

## 2020-05-10 LAB — GLUCOSE, CAPILLARY
Glucose-Capillary: 101 mg/dL — ABNORMAL HIGH (ref 70–99)
Glucose-Capillary: 115 mg/dL — ABNORMAL HIGH (ref 70–99)
Glucose-Capillary: 117 mg/dL — ABNORMAL HIGH (ref 70–99)
Glucose-Capillary: 120 mg/dL — ABNORMAL HIGH (ref 70–99)
Glucose-Capillary: 83 mg/dL (ref 70–99)
Glucose-Capillary: 89 mg/dL (ref 70–99)

## 2020-05-10 LAB — CBC
HCT: 28.3 % — ABNORMAL LOW (ref 39.0–52.0)
Hemoglobin: 8.8 g/dL — ABNORMAL LOW (ref 13.0–17.0)
MCH: 26.2 pg (ref 26.0–34.0)
MCHC: 31.1 g/dL (ref 30.0–36.0)
MCV: 84.2 fL (ref 80.0–100.0)
Platelets: 131 10*3/uL — ABNORMAL LOW (ref 150–400)
RBC: 3.36 MIL/uL — ABNORMAL LOW (ref 4.22–5.81)
RDW: 18.9 % — ABNORMAL HIGH (ref 11.5–15.5)
WBC: 6.1 10*3/uL (ref 4.0–10.5)
nRBC: 1 % — ABNORMAL HIGH (ref 0.0–0.2)

## 2020-05-10 LAB — LACTATE DEHYDROGENASE: LDH: 231 U/L — ABNORMAL HIGH (ref 98–192)

## 2020-05-10 LAB — COOXEMETRY PANEL
Carboxyhemoglobin: 1.5 % (ref 0.5–1.5)
Methemoglobin: 0.7 % (ref 0.0–1.5)
O2 Saturation: 56.9 %
Total hemoglobin: 12.9 g/dL (ref 12.0–16.0)

## 2020-05-10 LAB — CULTURE, RESPIRATORY W GRAM STAIN: Culture: NO GROWTH

## 2020-05-10 LAB — PHOSPHORUS: Phosphorus: 1.9 mg/dL — ABNORMAL LOW (ref 2.5–4.6)

## 2020-05-10 LAB — APTT
aPTT: 65 seconds — ABNORMAL HIGH (ref 24–36)
aPTT: 55 seconds — ABNORMAL HIGH (ref 24–36)

## 2020-05-10 LAB — BASIC METABOLIC PANEL
Anion gap: 6 (ref 5–15)
BUN: 20 mg/dL (ref 8–23)
CO2: 24 mmol/L (ref 22–32)
Calcium: 7.6 mg/dL — ABNORMAL LOW (ref 8.9–10.3)
Chloride: 105 mmol/L (ref 98–111)
Creatinine, Ser: 0.76 mg/dL (ref 0.61–1.24)
GFR, Estimated: >60 mL/min (ref >60)
Glucose, Bld: 112 mg/dL — ABNORMAL HIGH (ref 70–99)
Potassium: 4 mmol/L (ref 3.5–5.1)
Sodium: 135 mmol/L (ref 135–145)

## 2020-05-10 LAB — ECHOCARDIOGRAM LIMITED
Height: 72 in
Weight: 2451.52 oz

## 2020-05-10 MED ORDER — POTASSIUM CHLORIDE 20 MEQ PO PACK: 40.0000 meq | PACK | Freq: Two times a day (BID) | ORAL | Status: DC

## 2020-05-10 MED ORDER — VITAL 1.5 CAL PO LIQD
1000.0000 mL | ORAL | Status: DC
  Administered 2020-05-10 – 2020-05-12 (×3): 1000 mL
  Filled 2020-05-10 (×2): qty 1000

## 2020-05-10 MED ORDER — POTASSIUM & SODIUM PHOSPHATES 280-160-250 MG PO PACK
1.0000 | PACK | Freq: Once | ORAL | Status: AC
  Administered 2020-05-10: 1 via ORAL
  Filled 2020-05-10 (×2): qty 1

## 2020-05-10 MED ORDER — POTASSIUM PHOSPHATES 15 MMOLE/5ML IV SOLN
30.0000 mmol | Freq: Once | INTRAVENOUS | Status: AC
  Administered 2020-05-10: 30 mmol via INTRAVENOUS
  Filled 2020-05-10: qty 10

## 2020-05-10 MED ORDER — FUROSEMIDE 10 MG/ML IJ SOLN
40.0000 mg | Freq: Once | INTRAMUSCULAR | Status: AC
  Administered 2020-05-10: 40 mg via INTRAVENOUS
  Filled 2020-05-10: qty 4

## 2020-05-10 MED ORDER — POTASSIUM CHLORIDE 20 MEQ PO PACK
40.0000 meq | PACK | Freq: Two times a day (BID) | ORAL | Status: DC
  Administered 2020-05-10 – 2020-05-11 (×4): 40 meq
  Filled 2020-05-10 (×5): qty 2

## 2020-05-10 MED ORDER — PROSOURCE TF PO LIQD
45.0000 mL | Freq: Four times a day (QID) | ORAL | Status: DC
  Administered 2020-05-10 – 2020-05-15 (×22): 45 mL
  Filled 2020-05-10 (×21): qty 45

## 2020-05-10 NOTE — Progress Notes (Signed)
ANTICOAGULATION CONSULT NOTE  Pharmacy Consult for Heparin  Indication: Impella and hx PE  No Known Allergies  Patient Measurements: Height: 6' (182.9 cm) Weight: 69.5 kg (153 lb 3.5 oz) IBW/kg (Calculated) : 77.6 Heparin Dosing Weight: 60kg  Vital Signs: Temp: 98.7 F (37.1 C) (12/07 1542) Temp Source: Oral (12/07 1542) BP: 98/76 (12/07 0800) Pulse Rate: 59 (12/07 1700)  Labs: Recent Labs    05/08/20 0542 05/08/20 1240 05/08/20 2223 05/09/20 0457 05/09/20 0457 05/09/20 0740 05/09/20 1609 05/09/20 2117 05/10/20 0346 05/10/20 1714  HGB  --   --   --  7.5*   < >  --   --  9.2* 8.8*  --   HCT  --   --   --  24.6*  --   --   --  29.2* 28.3*  --   PLT  --   --   --  113*  --   --   --  142* 131*  --   APTT  --  76*   < > 64*   < >  --  56*  --  65* 55*  HEPARINUNFRC  --  0.98*  --  0.98*  --   --   --   --  0.95*  --   CREATININE   < >  --   --   --   --  0.75  --  0.83 0.76  --    < > = values in this interval not displayed.    Estimated Creatinine Clearance: 79.6 mL/min (by C-G formula based on SCr of 0.76 mg/dL).   Assessment: 74 yo Male with hx recent PE (03/2020) on apixaban PTA admitted with ADHF now s/p Impella placement, for heparin.   RN reported issues with Impella purge flow rate 12/5  but those have resolved.  Purge flow back to 35ml/hr = 600 uts/hr and systemic heparin 300 uts/hr, total heparin 900 uts/hr aptt 65sec     APTT still below goal range H/h low replace prbc pltc improving no over bleeding - some oozing at insertion site-improved  Goal of Therapy:  APTT 66-85 Monitor platelets by anticoagulation protocol: Yes   Plan:  Increase Heparin to 400 units/hour Recheck aptt with am labs  Alanda Slim, PharmD, [facility] Clinical Pharmacist Please see AMION for all Pharmacists' Contact Phone Numbers 05/10/2020, 6:25 PM

## 2020-05-10 NOTE — Progress Notes (Addendum)
Patient ID: Cody Arellano, male   DOB: [birth year], 74 y.o.   MRN: [id number]     Advanced Heart Failure Rounding Note  PCP-Cardiologist: Mertie Moores, MD   Subjective:    Impella 5.5 placed on 11/30 with cardiogenic shock.  Off norep and milrinone.  Back on heparin systemically and in purge.  I/Os mildly negative with 1 dose Lasix 40 mg IV yesterday.   VT last night, had DCCV back to NSR.  Now on amiodarone 60.   CXR with possible PNA right lung.  Tm 99.7.  He is on cefepime.   No complaints this morning, no dyspnea.  Walked to chair yesterday.  Eating about half of meals and has TFs running via [place].   PLTs 99 -> 102 -> 113 -> 131.  HIT negative. He got 1u RBCs 12/4 and again 12/6. hgb 7.4 -> 7.8 -> 7.5 -> 8.8.   TEE-guided DCCV on 12/3.  Echo with mildly dilated LV, EF 20%, mildly dilated RV with moderately decreased systolic function, moderate MR.   - Echo: EF 20-25% with septal akinesis, mid to apical inferior akinesis, apical anterior and apex akinesis.  Moderately dilated and dysfunctional RV.  Moderate TR.  Severe MR.  Bicuspid aortic valve with no stenosis, mild aortic insufficiency.  - LHC:  1. Dominant LCx, no significant disease in this vessel.  2. 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  The LIMA-LAD is also present.  3. The SVG-ramus has been occluded.  There is severe and diffuse in-stent restenosis in the proximal ramus.   Impella 5.5 P6  Flow 3.5L, no alarms LDH 231  [place] numbers: CVP 15 PA 44/12 CI 2.3 PAPI 1.5 Co-ox 57%  Objective:   Weight Range: 69.5 kg Body mass index is 20.78 kg/m.   Vital Signs:   Temp:  [96.1 F (35.6 C)-99.7 F (37.6 C)] 99 F (37.2 C) (12/07 0600) Pulse Rate:  [54-190] 54 (12/07 0600) Resp:  [15-30] 18 (12/07 0600) BP: (100)/(79) 100/79 (12/07 0000) SpO2:  [87 %-100 %] 99 % (12/07 0600) Arterial Line BP: (98-141)/(48-76) 117/72 (12/07 0400) Weight:  [69.5 kg] 69.5 kg (12/07 0439) Last BM Date:  05/01/20  Weight change: Filed Weights   05/08/20 0100 05/09/20 0500 05/10/20 0439  Weight: 67.3 kg 69.1 kg 69.5 kg    Intake/Output:   Intake/Output Summary (Last 24 hours) at 05/10/2020 3875 Last data filed at 05/10/2020 0400 Gross per 24 hour  Intake 3334.57 ml  Output 3790 ml  Net -455.43 ml      Physical Exam    General: NAD, thin Neck: JVP 12 cm, no thyromegaly or thyroid nodule.  Lungs: Clear to auscultation bilaterally with normal respiratory effort. CV: Lateral PMI.  Heart regular S1/S2, no S3/S4, 2/6 HSM apex.  No peripheral edema.   Abdomen: Soft, nontender, no hepatosplenomegaly, no distention.  Skin: Intact without lesions or rashes.  Neurologic: Alert and oriented x 3.  Psych: Normal affect. Extremities: No clubbing or cyanosis.  HEENT: Normal.    Telemetry    Sinus 60s Personally reviewed   Labs    CBC Recent Labs    05/09/20 2117 05/10/20 0346  WBC 6.2 6.1  HGB 9.2* 8.8*  HCT 29.2* 28.3*  MCV 82.7 84.2  PLT 142* 643*   Basic Metabolic Panel Recent Labs    05/08/20 0542 05/09/20 0740 05/09/20 2117 05/10/20 0346  NA 132*   < > 134* 135  K 3.7   < > 3.9 4.0  CL 103   < >  103 105  CO2 23   < > 21* 24  GLUCOSE 117*   < > 165* 112*  BUN 13   < > 20 20  CREATININE 0.68   < > 0.83 0.76  CALCIUM 7.7*   < > 7.9* 7.6*  MG 1.9  --  2.0  --   PHOS 1.6*  --  1.4*  --    < > = values in this interval not displayed.   Liver Function Tests Recent Labs    05/08/20 0542 05/09/20 2117  AST 111* 97*  ALT 62* 83*  ALKPHOS 83 90  BILITOT 1.1 1.4*  PROT 4.8* 5.4*  ALBUMIN 1.7* 1.8*   No results for input(s): LIPASE, AMYLASE in the last 72 hours. Cardiac Enzymes No results for input(s): CKTOTAL, CKMB, CKMBINDEX, TROPONINI in the last 72 hours.  BNP: BNP (last 3 results) Recent Labs    04/06/20 2134 04/26/20 1319 04/30/20 0248  BNP 1,677.6* 1,824.4* 1,685.5*    ProBNP (last 3 results) No results for input(s): PROBNP in the last 8760  hours.   D-Dimer No results for input(s): DDIMER in the last 72 hours. Hemoglobin A1C No results for input(s): HGBA1C in the last 72 hours. Fasting Lipid Panel No results for input(s): CHOL, HDL, LDLCALC, TRIG, CHOLHDL, LDLDIRECT in the last 72 hours. Thyroid Function Tests No results for input(s): TSH, T4TOTAL, T3FREE, THYROIDAB in the last 72 hours.  Invalid input(s): FREET3  Other results:   Imaging    ECHOCARDIOGRAM LIMITED  Result Date: 05/09/2020    ECHOCARDIOGRAM LIMITED REPORT   [name] Date of Exam: 05/09/2020 Medical Rec #:  [id number]    Height:       72.0 in Accession #:    [id number]   Weight:       152.3 lb Date of Birth:  06-11-1945    BSA:          1.898 m Patient Age:    81 years     BP:           122/65 mmHg Patient Gender: M            HR:           58 bpm. Exam Location:  Inpatient Procedure: Limited Echo Indications:    I50.23 Acute on chronic systolic (congestive) heart failure  History:        Patient has prior history of Echocardiogram examinations, most                 recent 05/06/2020. CAD and Previous Myocardial Infarction, Prior                 CABG, Signs/Symptoms:Dyspnea; Risk Factors:Hypertension and                 Dyslipidemia. Lupus.  Sonographer:    Jonelle Sidle Dance Referring Phys: [place]  1. Limited study for impella placement. The basal to mid inferolateral/lateral segments are severely hypokinetic. All other segments are akinetic. The impella tip is 4.9-5.0 cm from the AoV. Left ventricular ejection fraction, by estimation, is 15-20%. The left ventricle has severely decreased function. The left ventricle demonstrates global hypokinesis.  2. Right ventricular systolic function is moderately reduced. The right ventricular size is moderately enlarged.  3. Left atrial size was mildly dilated.  4. Right atrial size was mildly dilated. Comparison(s): Changes from prior study are noted. RV now moderately dilated with  moderately reduced function. Impella in  likely acceptable position EF remains severely reduced. FINDINGS  Left Ventricle: Limited study for impella placement. The basal to mid inferolateral/lateral segments are severely hypokinetic. All other segments are akinetic. The impella tip is 4.9-5.0 cm from the AoV. Left ventricular ejection fraction, by estimation, is 15-20%. The left ventricle has severely decreased function. The left ventricle demonstrates global hypokinesis. Right Ventricle: The right ventricular size is moderately enlarged. Right ventricular systolic function is moderately reduced. Left Atrium: Left atrial size was mildly dilated. Right Atrium: Right atrial size was mildly dilated. Pericardium: Trivial pericardial effusion is present. Additional Comments: A venous catheter is visualized in the right ventricle. There is a small pleural effusion in the left lateral region. Eleonore Chiquito MD Electronically signed by Eleonore Chiquito MD Signature Date/Time: 05/09/2020/11:38:58 AM    Final      Medications:     Scheduled Medications: . sodium chloride   Intravenous Once  . aspirin  81 mg Oral Daily  . chlorhexidine  15 mL Mouth Rinse BID  . Chlorhexidine Gluconate Cloth  6 each Topical Daily  . digoxin  0.125 mg Oral Daily  . docusate  100 mg Oral BID  . feeding supplement  237 mL Oral BID BM  . feeding supplement (PROSource TF)  45 mL Per Tube TID  . mouth rinse  15 mL Mouth Rinse q12n4p  . midazolam      . multivitamin with minerals  1 tablet Oral Daily  . [START ON 05/11/2020] potassium chloride  40 mEq Per Tube BID  . rosuvastatin  20 mg Oral Daily  . sodium chloride flush  10-40 mL Intracatheter Q12H  . sodium chloride flush  3 mL Intravenous Q12H  . spironolactone  12.5 mg Oral Daily    Infusions: . sodium chloride Stopped (05/06/20 0858)  . sodium chloride    . sodium chloride 250 mL (05/08/20 0548)  . sodium chloride 10 mL/hr at 05/09/20 1240  . sodium chloride 250 mL/hr at  05/09/20 1700  . amiodarone 60 mg/hr (05/10/20 0614)  . ceFEPime (MAXIPIME) IV 2 g (05/10/20 8850)  . dextrose Stopped (05/06/20 2218)  . feeding supplement (VITAL 1.5 CAL) 55 mL/hr at 05/09/20 1800  . heparin 300 Units/hr (05/10/20 0400)  . impella catheter heparin 50 unit/mL in dextrose 5%    . lactated ringers 20 mL/hr at 05/10/20 0400  . norepinephrine (LEVOPHED) Adult infusion Stopped (05/09/20 2339)  . potassium PHOSPHATE IVPB (in mmol) 83 mL/hr at 05/10/20 0400    PRN Medications: Place/Maintain arterial line **AND** sodium chloride, sodium chloride, acetaminophen (TYLENOL) oral liquid 160 mg/5 mL, bisacodyl, fentaNYL (SUBLIMAZE) injection, lidocaine, ondansetron (ZOFRAN) IV, oxyCODONE, polyethylene glycol, Resource ThickenUp Clear, sodium chloride flush, sodium chloride flush, white petrolatum   Assessment/Plan   1. 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  Echo this admission looks worse than 10/21 with EF 20-25% with septal akinesis, mid to apical inferior akinesis, apical anterior and apex akinesis, moderately dilated and dysfunctional RV, moderate TR, severe MR, bicuspid aortic valve with no stenosis, mild aortic insufficiency. Progressive/end-stage CHF worsened by onset of atrial fibrillation. Cardiogenic shock 11/30 with co-ox down to 30%, Impella 5.5 placed to allow time to stabilize and consider further steps.  Currently with CI 2.3 and co-ox 57% on P6 Impella with no alarms. Waveforms look good. On heparin gtt. Had VT episode last night, CVP higher at 15 today. LDH stable, no evidence for significant hemolysis.  - Off NE/milrinone.   - Check position of Impella by echo today =>  stable position 4.4 cm. With VT last night, will hold off on milrinone initiation/further Impella wean today and continue Impella at P6.  - Lasix 40 mg IV x 1 with higher CVP, has responded vigorously to Lasix.   - Continue digoxin 0.125 with improving creatinine.  -  Continue spironolactone 12.5 daily.  - He has end-stage biventricular HF with cardiogenic shock physiology in the setting of AF/RVR and severe MR and advanced ischemic CM.  He now s/p Impella 5.5 to support DC-CV and medical optimization.  Now off inotropes. Maintaining NSR. He is not a transplant candidate.  Barriers to LVAD include nutritional status/deconditioning and RV.  I think RV is going to be acceptable.  He is getting tube feeds and Ensure.  We discussed in [place], not LVAD candidate at this point with malnutrition/deconditioning.  Will try to wean Impella to milrinone to allow time to improve nutritional status and mobility (hopefully to CIR).  - Palliative Care has seen and continue to discuss [place] 2. CAD: Prior history of MI with LAD and ramus PCI. Admitted May 2021 with anterior STEMI. LHC with occluded ostial LAD (in-stent), 90% ostial ramus, 60-70% proximal LCx, nondominant RCA. PTCA to LAD and ramus to restore flow, then CABG with LIMA-LAD and SVG-ramus.  No chest pain, doubt ACS.  Fall in EF from 10/21 to 11/21, but he did not present with ACS.  [place] 12/1 showed patient LIMA-LAD, patent native LAD, patent dominant LCx.  The SVG-ramus is occluded with severe diffuse in-stent restenosis in proximal ramus.  Ramus not intervened upon as this would be a complex intervention and unlikely to markedly improve EF. No chest pain.  - Continue ASA 81 and statin.    3. AKI: Creatinine now trending down with improved cardiac output. Renal function normalized.   4. Right pleural effusion: S/p thoracentesis, transudative (CHF).  5. Atrial fibrillation/flutter with RVR: DCCV on 12/3, maintaining NSR. - Continue IV amiodarone.  -  IV heparin.  6. Mitral regurgitation:  TEE 12/3 with moderate central MR.  Doubt MitraClip will help him much with moderate MR and he is likely too advanced.  7. H/o PE: In 10/21.  Keep anticoagulated, on heparin gtt.  8. Bicuspid aortic valve: Mild AI, no AS.  9. SLE: H/o  pericarditis.  On Imuran and hydroxychloroquine on. Holding hydroxychloroquine for now with risk for QT prolongation.  10. Pulmonary nodules/emphysema: CCM following, s/p bronch/BAL (no growth).  11. ID: Concern for right-sided PNA, ?aspiration.  Tm 99.9, WBCs normal. PCT mildly elevated 0.61. Cultures NGTD.  - Treating empirically with cefepime (vanc stopped).  12. Thrombocytopenia: Higher at 131 today.  May be due to critical illness, low grade hemolysis with Impella. LDH ok.  HIT negative.  - Back on heparin gtt.  13. Hypokalemia/hypomag: Supplement as needed.  14. Acute blood loss anemia: No obvious site.  Looks like less swelling at Impella site.  Hgb 8.8 today after 1 unit yesterday.  - Transfuse hgb < 8.   CRITICAL CARE Performed by: Loralie Champagne  Total critical care time: 40 minutes  Critical care time was exclusive of separately billable procedures and treating other patients.  Critical care was necessary to treat or prevent imminent or life-threatening deterioration.  Critical care was time spent personally by me (independent of midlevel providers or residents) on the following activities: development of treatment plan with patient and/or surrogate as well as nursing, discussions with consultants, evaluation of patient's response to treatment, examination of patient, obtaining history from patient or surrogate,  ordering and performing treatments and interventions, ordering and review of laboratory studies, ordering and review of radiographic studies, pulse oximetry and re-evaluation of patient's condition.     Length of Stay: [place], MD  05/10/2020, 7:12 AM  Advanced Heart Failure Team Pager [phone] (M-F; 7a - 4p)  Please contact [facility] for night-coverage after hours (4p -7a ) and weekends on [email or web address]

## 2020-05-10 NOTE — Progress Notes (Signed)
ANTICOAGULATION CONSULT NOTE  Pharmacy Consult for Heparin  Indication: Impella and hx PE  No Known Allergies  Patient Measurements: Height: 6' (182.9 cm) Weight: 69.5 kg (153 lb 3.5 oz) IBW/kg (Calculated) : 77.6 Heparin Dosing Weight: 60kg  Vital Signs: Temp: 99.3 F (37.4 C) (12/07 1100) BP: 98/76 (12/07 0800) Pulse Rate: 45 (12/07 1100)  Labs: Recent Labs    05/08/20 0542 05/08/20 1240 05/08/20 2223 05/09/20 0457 05/09/20 0457 05/09/20 0740 05/09/20 1609 05/09/20 2117 05/10/20 0346  HGB  --   --   --  7.5*   < >  --   --  9.2* 8.8*  HCT  --   --   --  24.6*  --   --   --  29.2* 28.3*  PLT  --   --   --  113*  --   --   --  142* 131*  APTT  --  76*   < > 64*  --   --  56*  --  65*  HEPARINUNFRC  --  0.98*  --  0.98*  --   --   --   --  0.95*  CREATININE   < >  --   --   --   --  0.75  --  0.83 0.76   < > = values in this interval not displayed.    Estimated Creatinine Clearance: 79.6 mL/min (by C-G formula based on SCr of 0.76 mg/dL).   Assessment: 74 yo Male with hx recent PE (03/2020) on apixaban PTA admitted with ADHF now s/p Impella placement, for heparin.   RN reported issues with Impella purge flow rate 12/5  but those have resolved.  Purge flow back to 69ml/hr = 600 uts/hr and systemic heparin 300 uts/hr, total heparin 900 uts/hr aptt 65sec   APTT just below goal range H/h low replace prbc pltc improving no over bleeding - some oozing at insertion site-improved  Goal of Therapy:  APTT 66-85 Monitor platelets by anticoagulation protocol: Yes   Plan:  Continue Heparin at current rate  Recheck aptt this evening and increase systemic heparin slightly if aptt still low  Bonnita Nasuti Pharm.D. CPP, BCPS Clinical Pharmacist [phone] 05/10/2020 11:44 AM

## 2020-05-10 NOTE — Progress Notes (Addendum)
Nutrition Follow-up  DOCUMENTATION CODES:   Underweight, Severe malnutrition in context of chronic illness  INTERVENTION:   No BM x 9 days, trying today, if not plan suppository  Transition to nocturnal feedings:  -Vital 1.5 @ 75 ml/hr x 14 hours (1800-0800) via Cortrak -45 ml ProSource QID  Provides: 1735 kcals, 115 grams protein, 802 ml free water. Meets 83% of kcal needs and 100% of protein needs   Magic cup TID with meals, each supplement provides 290 kcal and 9 grams of protein  Ensure Enlive po BID, each supplement provides 350 kcal and 20 grams of protein  Continue to monitor Phosphorus, serum level at 1.4 today PhosNAK given.   NUTRITION DIAGNOSIS:   Severe Malnutrition related to chronic illness (CHF) as evidenced by severe muscle depletion, severe fat depletion, percent weight loss.  Ongoing  GOAL:   Patient will meet greater than or equal to 90% of their needs  Addressed via TF  MONITOR:   PO intake, Supplement acceptance, Skin, Weight trends, Labs, I & O's  REASON FOR ASSESSMENT:   Consult Assessment of nutrition requirement/status, Poor PO  ASSESSMENT:   74 y.o. male with medical history significant for lupus, interstitial lung disease, coronary artery disease, chronic systolic CHF, paroxysmal atrial fibrillation presents with shortness of breath. Pt found to have large right-sided pleural effusion and small left-sided pleural effusion. Chest x-ray showed cardiomegaly with pulmonary venous congestion and bilateral interstitial prominence most consistent with interstitial edema.  11/26- s/p Flexible video fiberoptic bronchoscopy and biopsies 11/30- s/p impella   Pt discussed during ICU rounds and with RN.   Intake progressing slowly. Last three meal completions charted as 50%, 50%, 60%. Tolerating Vital 1.5 @ 55 ml/hr. Has not had BM in 9 days. Going to try today as feels he needs to go. If not plan suppository.   Phosphorus trending down daily.  PhosNAK given this am. May need aggressive supplementation given severity of malnutrition and increase in daily kcal/protein (via TF and PO intake).    Admission weight: 66.7 kg  Current weight: 69.5 kg   UOP: 4190 ml x 24 hrs   Drips: LR @ 20 ml/hr  Medications: colace, 40 mEq KCl BID, aldactone  Labs: Phosphorus 1.4 (L) CBG 81-165  Diet Order:   Diet Order            Diet regular Room service appropriate? Yes with Assist; Fluid consistency: Thin  Diet effective now                 EDUCATION NEEDS:   Education needs have been addressed  Skin:  Skin Assessment: Skin Integrity Issues: Skin Integrity Issues:: Incisions Incisions: R chest  Last BM:  11/28  Height:   Ht Readings from Last 1 Encounters:  04/29/20 6' (1.829 m)    Weight:   Wt Readings from Last 1 Encounters:  05/10/20 69.5 kg   BMI:  Body mass index is 20.78 kg/m.  Estimated Nutritional Needs:   Kcal:  2100-2300  Protein:  115-130 grams  Fluid:  > 2 L   Mariana Single RD, LDN Clinical Nutrition Pager listed in [place]

## 2020-05-10 NOTE — Progress Notes (Signed)
Palliative:  HPI: 74 y.o. male  with past medical history of SLE, interstitial lung disease, coronary artery disease, chronic systolic heart failure, CABG 10/2019, paroxysmal atrial fibrillation on Eliquis, recent PE admitted on 04/26/2020 with shortness of breath related to cardiogenic shock with CHF EF 20-25%, severe MR/TR, afib RVR. Impella placed 11/30. Cortrak to be placed 12/3.   I met today at Cody Arellano' bedside along with son, Cody Arellano. Cody Arellano is extremely tired today and has had difficulty with his heart rate overnight and this morning. He is exhausted and struggling to stay awake today. He does share he had a good day yesterday and feels if he rests today he may have another good day tomorrow.   I revisited the option of family meeting to further discuss goals of care and decisions. Cody Arellano does not respond to this but Cody Arellano shares that he and his mother have been discussing and are interested and he will discuss further with his family about time of meeting. Cody Arellano reports that he or his mother had planned to reach out to me today. They will contact me for further planning.   All questions/concerns addressed. Emotional support provided.   Exam: Alert, fatigued. No distress. HR 40s. Impella in place. Breathing regular, unlabored. Abd flat. Cortrak in place.   Plan: - Recommend family meeting for goals of care.   15 min  Cody Sill, NP Palliative Medicine Team Pager [phone] (Please see [email or web address] for schedule) Team Phone [phone]    Greater than 50%  of this time was spent counseling and coordinating care related to the above assessment and plan

## 2020-05-10 NOTE — Progress Notes (Signed)
Echocardiogram 2D Echocardiogram has been performed.  Cody Arellano 05/10/2020, 9:33 AM

## 2020-05-10 NOTE — Progress Notes (Signed)
Rehab Admissions Coordinator Note:  Patient was screened by Cleatrice Burke for appropriateness for an Inpatient Acute Rehab Consult per therapy recs.  At this time, we are recommending Inpatient Rehab consult if you would like patient considered for a possible CIR admit. Please advise.  Cleatrice Burke RN MSN 05/10/2020, 4:31 PM  I can be reached at [phone].

## 2020-05-11 DIAGNOSIS — Z95811 Presence of heart assist device: Secondary | ICD-10-CM | POA: Diagnosis not present

## 2020-05-11 DIAGNOSIS — I5023 Acute on chronic systolic (congestive) heart failure: Secondary | ICD-10-CM | POA: Diagnosis not present

## 2020-05-11 LAB — BASIC METABOLIC PANEL
Anion gap: 6 (ref 5–15)
BUN: 25 mg/dL — ABNORMAL HIGH (ref 8–23)
CO2: 24 mmol/L (ref 22–32)
Calcium: 7.8 mg/dL — ABNORMAL LOW (ref 8.9–10.3)
Chloride: 105 mmol/L (ref 98–111)
Creatinine, Ser: 0.8 mg/dL (ref 0.61–1.24)
GFR, Estimated: >60 mL/min (ref >60)
Glucose, Bld: 103 mg/dL — ABNORMAL HIGH (ref 70–99)
Potassium: 4.7 mmol/L (ref 3.5–5.1)
Sodium: 135 mmol/L (ref 135–145)

## 2020-05-11 LAB — HEPARIN LEVEL (UNFRACTIONATED)
Heparin Unfractionated: 0.76 IU/mL — ABNORMAL HIGH (ref 0.30–0.70)
Heparin Unfractionated: 0.53 IU/mL (ref 0.30–0.70)

## 2020-05-11 LAB — GLUCOSE, CAPILLARY
Glucose-Capillary: 107 mg/dL — ABNORMAL HIGH (ref 70–99)
Glucose-Capillary: 126 mg/dL — ABNORMAL HIGH (ref 70–99)
Glucose-Capillary: 68 mg/dL — ABNORMAL LOW (ref 70–99)
Glucose-Capillary: 76 mg/dL (ref 70–99)
Glucose-Capillary: 76 mg/dL (ref 70–99)
Glucose-Capillary: 85 mg/dL (ref 70–99)
Glucose-Capillary: 97 mg/dL (ref 70–99)

## 2020-05-11 LAB — CBC
HCT: 28.3 % — ABNORMAL LOW (ref 39.0–52.0)
Hemoglobin: 8.7 g/dL — ABNORMAL LOW (ref 13.0–17.0)
MCH: 26.4 pg (ref 26.0–34.0)
MCHC: 30.7 g/dL (ref 30.0–36.0)
MCV: 86 fL (ref 80.0–100.0)
Platelets: 143 10*3/uL — ABNORMAL LOW (ref 150–400)
RBC: 3.29 MIL/uL — ABNORMAL LOW (ref 4.22–5.81)
RDW: 19.4 % — ABNORMAL HIGH (ref 11.5–15.5)
WBC: 6.7 10*3/uL (ref 4.0–10.5)
nRBC: 2.2 % — ABNORMAL HIGH (ref 0.0–0.2)

## 2020-05-11 LAB — COOXEMETRY PANEL
Carboxyhemoglobin: 2.4 % — ABNORMAL HIGH (ref 0.5–1.5)
Methemoglobin: 0.7 % (ref 0.0–1.5)
O2 Saturation: 63.6 %
Total hemoglobin: 11.2 g/dL — ABNORMAL LOW (ref 12.0–16.0)

## 2020-05-11 LAB — APTT
aPTT: 87 seconds — ABNORMAL HIGH (ref 24–36)
aPTT: 60 seconds — ABNORMAL HIGH (ref 24–36)

## 2020-05-11 LAB — LACTATE DEHYDROGENASE: LDH: 251 U/L — ABNORMAL HIGH (ref 98–192)

## 2020-05-11 MED ORDER — HEPARIN (PORCINE) 25000 UT/250ML-% IV SOLN
350.0000 [IU]/h | INTRAVENOUS | Status: DC
  Administered 2020-05-12 – 2020-05-16 (×3): 350 [IU]/h via INTRAVENOUS
  Filled 2020-05-11 (×3): qty 250

## 2020-05-11 MED ORDER — AMIODARONE HCL 200 MG PO TABS
200.0000 mg | ORAL_TABLET | Freq: Two times a day (BID) | ORAL | Status: DC
  Administered 2020-05-11 – 2020-05-23 (×23): 200 mg via ORAL
  Filled 2020-05-11 (×24): qty 1

## 2020-05-11 MED ORDER — DIGOXIN 125 MCG PO TABS
0.1250 mg | ORAL_TABLET | Freq: Every day | ORAL | Status: DC
  Administered 2020-05-11 – 2020-05-23 (×12): 0.125 mg via ORAL
  Filled 2020-05-11 (×12): qty 1

## 2020-05-11 MED ORDER — LOSARTAN POTASSIUM 25 MG PO TABS
12.5000 mg | ORAL_TABLET | Freq: Two times a day (BID) | ORAL | Status: DC
  Administered 2020-05-11 – 2020-05-12 (×4): 12.5 mg via ORAL
  Filled 2020-05-11 (×4): qty 1

## 2020-05-11 MED ORDER — DEXTROSE 50 % IV SOLN
INTRAVENOUS | Status: AC
  Administered 2020-05-11: 25 mL
  Filled 2020-05-11: qty 50

## 2020-05-11 NOTE — Progress Notes (Addendum)
Patient ID: Cody Arellano, male   DOB: 19-Sep-1945, 74 y.o.   MRN: [id number]     Advanced Heart Failure Rounding Note  PCP-Cardiologist: Mertie Moores, MD   Subjective:    Impella 5.5 placed on 11/30 with cardiogenic shock.  Off norep and milrinone.  Back on heparin systemically and in purge.  I/Os negative.    VT 12/7 early am had DCCV, converted back to afib.  Later went into a junctional rhythm and amiodarone stopped.  This morning, in NSR.    CXR with possible PNA right lung.  Tm 99.1.  He is on cefepime.   No complaints this morning, no dyspnea.  Walked to chair this morning.  Eating about half of meals and has TFs running via Cortrack.   PLTs 99 -> 102 -> 113 -> 131 -> 143.  HIT negative. He got 1u RBCs 12/4 and again 12/6. hgb 7.4 -> 7.8 -> 7.5 -> 8.8 -> 8.7.   TEE-guided DCCV on 12/3.  Echo with mildly dilated LV, EF 20%, mildly dilated RV with moderately decreased systolic function, moderate MR.   - Echo: EF 20-25% with septal akinesis, mid to apical inferior akinesis, apical anterior and apex akinesis.  Moderately dilated and dysfunctional RV.  Moderate TR.  Severe MR.  Bicuspid aortic valve with no stenosis, mild aortic insufficiency.  - LHC:  1. Dominant LCx, no significant disease in this vessel.  2. 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  The LIMA-LAD is also present.  3. The SVG-ramus has been occluded.  There is severe and diffuse in-stent restenosis in the proximal ramus.   Impella 5.5 P6  Flow 3.5L, no alarms LDH 231 -> 251  [place] numbers: CVP 5 PA 33/12 CI 2.7 Co-ox 64%  Objective:   Weight Range: 69.5 kg Body mass index is 20.78 kg/m.   Vital Signs:   Temp:  [98.6 F (37 C)-99.9 F (37.7 C)] 98.8 F (37.1 C) (12/08 0700) Pulse Rate:  [42-62] 57 (12/08 0700) Resp:  [16-26] 23 (12/08 0700) BP: (98)/(76) 98/76 (12/07 0800) SpO2:  [90 %-100 %] 98 % (12/08 0700) Arterial Line BP: (97-130)/(52-84) 109/60 (12/08 0600) Last BM Date:  05/01/20  Weight change: Filed Weights   05/08/20 0100 05/09/20 0500 05/10/20 0439  Weight: 67.3 kg 69.1 kg 69.5 kg    Intake/Output:   Intake/Output Summary (Last 24 hours) at 05/11/2020 0736 Last data filed at 05/11/2020 0700 Gross per 24 hour  Intake 2992.06 ml  Output 4765 ml  Net -1772.94 ml      Physical Exam    General: NAD, frail Neck: No JVD, no thyromegaly or thyroid nodule.  Lungs: Clear to auscultation bilaterally with normal respiratory effort. CV: Nondisplaced PMI.  Heart regular S1/S2, no S3/S4, 2/6 HSM apex.  No peripheral edema.   Abdomen: Soft, nontender, no hepatosplenomegaly, no distention.  Skin: Intact without lesions or rashes.  Neurologic: Alert and oriented x 3.  Psych: Normal affect. Extremities: No clubbing or cyanosis.  HEENT: Normal.    Telemetry    Sinus 50s-60s Personally reviewed   Labs    CBC Recent Labs    05/10/20 0346 05/11/20 0356  WBC 6.1 6.7  HGB 8.8* 8.7*  HCT 28.3* 28.3*  MCV 84.2 86.0  PLT 131* 409*   Basic Metabolic Panel Recent Labs    05/09/20 2117 05/09/20 2117 05/10/20 0346 05/10/20 2011 05/11/20 0356  NA 134*   < > 135  --  135  K 3.9   < >  4.0  --  4.7  CL 103   < > 105  --  105  CO2 21*   < > 24  --  24  GLUCOSE 165*   < > 112*  --  103*  BUN 20   < > 20  --  25*  CREATININE 0.83   < > 0.76  --  0.80  CALCIUM 7.9*   < > 7.6*  --  7.8*  MG 2.0  --   --   --   --   PHOS 1.4*  --   --  1.9*  --    < > = values in this interval not displayed.   Liver Function Tests Recent Labs    05/09/20 2117  AST 97*  ALT 83*  ALKPHOS 90  BILITOT 1.4*  PROT 5.4*  ALBUMIN 1.8*   No results for input(s): LIPASE, AMYLASE in the last 72 hours. Cardiac Enzymes No results for input(s): CKTOTAL, CKMB, CKMBINDEX, TROPONINI in the last 72 hours.  BNP: BNP (last 3 results) Recent Labs    04/06/20 2134 04/26/20 1319 04/30/20 0248  BNP 1,677.6* 1,824.4* 1,685.5*    ProBNP (last 3 results) No results for  input(s): PROBNP in the last 8760 hours.   D-Dimer No results for input(s): DDIMER in the last 72 hours. Hemoglobin A1C No results for input(s): HGBA1C in the last 72 hours. Fasting Lipid Panel No results for input(s): CHOL, HDL, LDLCALC, TRIG, CHOLHDL, LDLDIRECT in the last 72 hours. Thyroid Function Tests No results for input(s): TSH, T4TOTAL, T3FREE, THYROIDAB in the last 72 hours.  Invalid input(s): FREET3  Other results:   Imaging    ECHOCARDIOGRAM LIMITED  Result Date: 05/10/2020    ECHOCARDIOGRAM LIMITED REPORT   [name] Date of Exam: 05/10/2020 Medical Rec #:  [id number]    Height:       72.0 in Accession #:    [id number]   Weight:       153.2 lb Date of Birth:  [birth year]    BSA:          1.902 m Patient Age:    85 years     BP:           121/75 mmHg Patient Gender: M            HR:           44 bpm. Exam Location:  Inpatient Procedure: Limited Echo, Color Doppler and Cardiac Doppler Indications:    Impella Position  History:        Patient has prior history of Echocardiogram examinations, most                 recent 05/09/2020. CHF, CAD, Arrythmias:Atrial Fibrillation; Risk                 Factors:Dyslipidemia and Lupus.  Sonographer:    Raquel Sarna Senior RDCS Referring Phys: [place]  1. Left ventricular thrombus is not seen. Impella catheter position is appropriate. Left ventricular ejection fraction, by estimation, is <20%. The left ventricle has severely decreased function. The left ventricle demonstrates regional wall motion abnormalities (see scoring diagram/findings for description). Regional wall motion suggests infarction/scar in the LAD artery and right coronary artery distribution, with mild apical dyskinesis. The areas of myocardium that exhibit akinesis/dyskinesis are also thin, suggestive of low likelihood of viability.  2. Right ventricular systolic function is moderately reduced. The right ventricular size is moderately enlarged.  There is mildly elevated pulmonary artery systolic pressure. The estimated right ventricular systolic pressure is 53.6 mmHg.  3. Left atrial size was severely dilated.  4. Right atrial size was severely dilated.  5. The mitral valve was not interrogated with color Doppler. The mitral valve is grossly normal. No evidence of mitral stenosis.  6. Tricuspid valve regurgitation is severe.  7. The aortic valve was not interrogated with color Doppler. The aortic valve is grossly normal. No aortic stenosis is present.  8. The inferior vena cava is dilated in size with <50% respiratory variability, suggesting right atrial pressure of 15 mmHg. Comparison(s): No significant change from prior study. Prior images reviewed side by side. The Impella position appears more stable. FINDINGS  Left Ventricle: Left ventricular thrombus is not seen. Impella catheter position is appropriate. Left ventricular ejection fraction, by estimation, is <20%. The left ventricle has severely decreased function. The left ventricle demonstrates regional wall motion abnormalities.  LV Wall Scoring: The apical septal segment, apical anterior segment, and apex are dyskinetic. The mid and distal inferior wall, mid anteroseptal segment, apical lateral segment, and mid inferoseptal segment are akinetic. The basal anteroseptal segment, mid inferolateral segment, mid anterior segment, and basal inferior segment are hypokinetic. The antero-lateral wall, basal inferolateral segment, basal anterior segment, and basal inferoseptal segment are normal. Regional wall motion suggests infarction/scar in the LAD artery and right coronary artery distribution, with mild apical dyskinesis. Right Ventricle: The right ventricular size is moderately enlarged. No increase in right ventricular wall thickness. Right ventricular systolic function is moderately reduced. There is mildly elevated pulmonary artery systolic pressure. The tricuspid regurgitant velocity is 2.70 m/s,  and with an assumed right atrial pressure of 15 mmHg, the estimated right ventricular systolic pressure is 64.4 mmHg. Left Atrium: Left atrial size was severely dilated. Right Atrium: Right atrial size was severely dilated. Pericardium: There is no evidence of pericardial effusion. Mitral Valve: The mitral valve was not interrogated with color Doppler. The mitral valve is grossly normal. No evidence of mitral valve stenosis. Tricuspid Valve: Tricuspid valve regurgitation is severe. Aortic Valve: The aortic valve was not interrogated with color Doppler. The aortic valve is grossly normal. No aortic stenosis is present. Pulmonic Valve: The pulmonic valve was not assessed. Aorta: The aortic root is normal in size and structure. Venous: The inferior vena cava is dilated in size with less than 50% respiratory variability, suggesting right atrial pressure of 15 mmHg. IAS/Shunts: The interatrial septum was not assessed. Additional Comments: A venous catheter is visualized. RIGHT VENTRICLE RV S prime:     9.68 cm/s TRICUSPID VALVE TR Peak grad:   29.2 mmHg TR Vmax:        270.00 cm/s Dani Gobble Croitoru MD Electronically signed by Sanda Klein MD Signature Date/Time: 05/10/2020/10:40:54 AM    Final      Medications:     Scheduled Medications: . sodium chloride   Intravenous Once  . amiodarone  200 mg Oral BID  . aspirin  81 mg Oral Daily  . chlorhexidine  15 mL Mouth Rinse BID  . Chlorhexidine Gluconate Cloth  6 each Topical Daily  . digoxin  0.125 mg Oral Daily  . docusate  100 mg Oral BID  . feeding supplement  237 mL Oral BID BM  . feeding supplement (PROSource TF)  45 mL Per Tube QID  . losartan  12.5 mg Oral BID  . mouth rinse  15 mL Mouth Rinse q12n4p  . multivitamin with minerals  1 tablet Oral Daily  .  potassium chloride  40 mEq Per Tube BID  . rosuvastatin  20 mg Oral Daily  . sodium chloride flush  10-40 mL Intracatheter Q12H  . sodium chloride flush  3 mL Intravenous Q12H  . spironolactone   12.5 mg Oral Daily    Infusions: . sodium chloride Stopped (05/06/20 0858)  . sodium chloride    . sodium chloride 250 mL (05/08/20 0548)  . sodium chloride 10 mL/hr at 05/09/20 1240  . sodium chloride 250 mL/hr at 05/11/20 0600  . ceFEPime (MAXIPIME) IV 2 g (05/11/20 0559)  . dextrose Stopped (05/06/20 2218)  . feeding supplement (VITAL 1.5 CAL) Stopped (05/11/20 0600)  . heparin 400 Units/hr (05/11/20 0400)  . impella catheter heparin 50 unit/mL in dextrose 5%    . lactated ringers 20 mL/hr at 05/11/20 0400  . norepinephrine (LEVOPHED) Adult infusion Stopped (05/09/20 2339)    PRN Medications: Place/Maintain arterial line **AND** sodium chloride, sodium chloride, acetaminophen (TYLENOL) oral liquid 160 mg/5 mL, bisacodyl, fentaNYL (SUBLIMAZE) injection, lidocaine, ondansetron (ZOFRAN) IV, oxyCODONE, polyethylene glycol, Resource ThickenUp Clear, sodium chloride flush, sodium chloride flush, white petrolatum   Assessment/Plan   1. 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  Echo this admission looks worse than 10/21 with EF 20-25% with septal akinesis, mid to apical inferior akinesis, apical anterior and apex akinesis, moderately dilated and dysfunctional RV, moderate TR, severe MR, bicuspid aortic valve with no stenosis, mild aortic insufficiency. Progressive/end-stage CHF worsened by onset of atrial fibrillation. Cardiogenic shock 11/30 with co-ox down to 30%, Impella 5.5 placed to allow time to stabilize and consider further steps.  Currently with CI 2.7 and co-ox 64% on P6 Impella with no alarms. Waveforms look good. On heparin gtt. CVP lower at 5. LDH stable, no evidence for significant hemolysis.  - Off NE/milrinone.   - No Lasix for now.  - Decrease Impella speed to P5.  - Start losartan 12.5 mg bid.  - Can restart digoxin this morning.  - Continue spironolactone 12.5 daily.  - He has end-stage biventricular HF with cardiogenic shock physiology in  the setting of AF/RVR and severe MR and advanced ischemic CM.  He now s/p Impella 5.5 to support DC-CV and medical optimization.  Now off inotropes. Maintaining NSR. He is not a transplant candidate.  Barriers to LVAD include nutritional status/deconditioning and RV.  I think RV would be acceptable.  He is getting tube feeds and Ensure.  We discussed in [place], not LVAD candidate at this point with malnutrition/deconditioning.  Will try to wean Impella potentially to milrinone to allow time to improve nutritional status and mobility (hopefully to CIR).  - Palliative Care has seen and continue to discuss [place] 2. CAD: Prior history of MI with LAD and ramus PCI. Admitted May 2021 with anterior STEMI. LHC with occluded ostial LAD (in-stent), 90% ostial ramus, 60-70% proximal LCx, nondominant RCA. PTCA to LAD and ramus to restore flow, then CABG with LIMA-LAD and SVG-ramus.  No chest pain, doubt ACS.  Fall in EF from 10/21 to 11/21, but he did not present with ACS.  [place] 12/1 showed patient LIMA-LAD, patent native LAD, patent dominant LCx.  The SVG-ramus is occluded with severe diffuse in-stent restenosis in proximal ramus.  Ramus not intervened upon as this would be a complex intervention and unlikely to markedly improve EF. No chest pain.  - Continue ASA 81 and statin.    3. AKI: Creatinine now trending down with improved cardiac output. Renal function normalized.   4. Right  pleural effusion: S/p thoracentesis, transudative (CHF).  5. Atrial fibrillation/flutter with RVR: DCCV on 12/3, went back to NSR 12/7. - Would restart amiodarone today at 200 mg bid.   -  IV heparin.  6. Mitral regurgitation:  TEE 12/3 with moderate central MR.  Doubt MitraClip will help him much with moderate MR and he is likely too advanced.  7. H/o PE: In 10/21.  Keep anticoagulated, on heparin gtt.  8. Bicuspid aortic valve: Mild AI, no AS.  9. SLE: H/o pericarditis.  On Imuran and hydroxychloroquine on. Holding hydroxychloroquine for  now with risk for QT prolongation.  10. Pulmonary nodules/emphysema: CCM following, s/p bronch/BAL (no growth).  11. ID: Concern for right-sided PNA, ?aspiration.  Tm 99.1, WBCs normal. Cultures NGTD.  - Treating empirically with cefepime (vanc stopped), stop at 7 days.  12. Thrombocytopenia: Higher at 143 today.  May be due to critical illness, low grade hemolysis with Impella. LDH ok.  HIT negative.  - Back on heparin gtt.  13. Hypokalemia/hypomag: Supplement as needed.  14. Acute blood loss anemia: No obvious site.  Looks like less swelling at Impella site.  Hgb 8.7 today.  - Transfuse hgb < 8.  15. VT: Episode early am 12/7, required DCCV.  No recurrence.   CRITICAL CARE Performed by: Loralie Champagne  Total critical care time: 40 minutes  Critical care time was exclusive of separately billable procedures and treating other patients.  Critical care was necessary to treat or prevent imminent or life-threatening deterioration.  Critical care was time spent personally by me (independent of midlevel providers or residents) on the following activities: development of treatment plan with patient and/or surrogate as well as nursing, discussions with consultants, evaluation of patient's response to treatment, examination of patient, obtaining history from patient or surrogate, ordering and performing treatments and interventions, ordering and review of laboratory studies, ordering and review of radiographic studies, pulse oximetry and re-evaluation of patient's condition.     Length of Stay: [place], MD  05/11/2020, 7:36 AM  Advanced Heart Failure Team Pager [phone] (M-F; 7a - 4p)  Please contact [facility] for night-coverage after hours (4p -7a ) and weekends on [email or web address]

## 2020-05-11 NOTE — Progress Notes (Signed)
  Speech Language Pathology Treatment: Dysphagia  Patient Details Name: Cody Arellano MRN: [id number] DOB: November 14, 1945 Today's Date: 05/11/2020 Time: 8938-1017 SLP Time Calculation (min) (ACUTE ONLY): 13 min  Assessment / Plan / Recommendation Clinical Impression  Pt was seen immediately following PT/OT session, sitting upright in bed. He believes his vocal quality is stable - nearing his baseline but not fully there. He describes it as getting "stronger" though. Intermittent throat clearing was noted during sips of water, followed by a delayed cough. Pt and wife both deny this happening much during meals, and this was not observed during most recent SLP visit. Pt says that when he does cough, it is often after he has had a lot of activity. Education was provided to pt/wife about s/s of aspiration for which to monitor. Also provided education about aspiration precautions and rationale for the above. Will continue to follow closely.    HPI HPI: Pt is a 74 y.o. male with medical history significant for lupus, interstitial lung disease, coronary artery disease, chronic systolic CHF, paroxysmal atrial fibrillation on Eliquis, and recent PE who presented to the emergency department due to shortness of breath that has been ongoing for about 2 weeks associated with nausea and intermittent nonbilious, nonbloody vomiting ~2-3 times per week. Pt had cardiogenic shock and impella placed 11/30; heart cath on 12/1. CXR 11/30: Marked worsening of airspace disease throughout the right chest most worrisome for pneumonia. CXR 12/1: Significant interval clearing of airspace opacity on the right as well as milder clearing on the left. No new opacity evident. CXR 12/2: Stable bibasilar subsegmental atelectasis. ETT 11/30-12/2. CXR 12/4: Airspace opacities bilaterally with right worse than left. Small effusions are noted.      SLP Plan  Continue with current plan of care       Recommendations  Diet recommendations:  Regular;Thin liquid Liquids provided via: Cup;Straw Medication Administration: Whole meds with puree Supervision: Patient able to self feed Compensations: Slow rate;Small sips/bites Postural Changes and/or Swallow Maneuvers: Seated upright 90 degrees                Oral Care Recommendations: Oral care BID Follow up Recommendations: None SLP Visit Diagnosis: Dysphagia, unspecified (R13.10) Plan: Continue with current plan of care       GO                Osie Bond., M.A. [place] [facility] Pager [phone] Office [phone]  05/11/2020, 10:58 AM

## 2020-05-11 NOTE — Progress Notes (Signed)
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 Cortrak to be placed 12/3.    I met again today with Cody Arellano and his wife is at bedside today. Cody Arellano tells me that he was just starting to fall asleep for a nap. He continues to struggle with fatigue but reports that he is having a better day today than yesterday. He also reports improved pain. He is tired and not very up to conversation currently but he does express desire for family meeting and points me to his wife to coordinate. We discussed and she expresses that her daughter would like to be part of meeting but she works in [place]. We did discuss that we can have their children over conference line to participate if they are unable to be present in person and she feels that this will work very well for them as they have 5 children spread over the country Cody Arellano is local here). She will work with children and let me know when they would like to coordinate. I will await to hear back from family for meeting time.   All questions.concern addressed. Emotional support provided.   Exam: Alert, fatigued. No distress. HR 50s. Impella in place. Breathing regular, unlabored. Abd flat. Cortrak in place.   Plan: - Await to hear from wife for requested time for family meeting for goals of care.   15 min  Vinie Sill, NP Palliative Medicine Team Pager [phone] (Please see [email or web address] for schedule) Team Phone [phone]    Greater than 50%  of this time was spent counseling and coordinating care related to the above assessment and plan

## 2020-05-11 NOTE — Progress Notes (Signed)
Physical Therapy Treatment Patient Details Name: Cody Arellano MRN: [id number] DOB: 12-30-1945 Today's Date: 05/11/2020    History of Present Illness Pt is a 74 y.o. male with medical history significant for lupus, interstitial lung disease, coronary artery disease, chronic systolic CHF, paroxysmal atrial fibrillation on Eliquis, and recent PE who presented to the emergency department due to shortness of breath that has been ongoing for about 2 weeks associated with nausea and intermittent nonbilious, nonbloody vomiting ~2-3 times per week. Pt had cardiogenic shock and impella placed 11/30; heart cath on 12/1. CXR 11/30: Marked worsening of airspace disease throughout the right chest most worrisome for pneumonia. CXR 12/1: Significant interval clearing of airspace opacity on the right as well as milder clearing on the left. No new opacity evident. CXR 12/2: Stable bibasilar subsegmental atelectasis. ETT 11/30-12/2.      PT Comments    Pt showed increased endurance with ambulation today, was highly motivated, and was able to navigate 150 feet with 2 standing rest breaks. Pt only needed +5 assistance for lines/lead management. For physical assistance, he was only +1 min assist for cues to keep trunk upright and limit use of R UE to only use it for balance support on RW. Near the end of ambulation pts legs started to get unsteady and pt stated increased fatigue. Pt continues to be appropriate for CIR in order to increase endurance in order to decrease caregiver burden, increase safety, and be able to return to PLOF. Resting vitals: 99% SpO2 on room air, 59 bpm, 108/49 (66) Vitals during gait: 60-63 bpm, unreliable wave form on SpO2 reading but on room air, pt denied any SOB sx and nurse said it was fine to continue Vitals resting supine at end of tx: 60 bpm, 96% SpO2 on room air, 125/62 (78)   Follow Up Recommendations  CIR;Supervision/Assistance - 24 hour     Equipment Recommendations  Rolling  walker with 5" wheels;3in1 (PT)    Recommendations for Other Services       Precautions / Restrictions Precautions Precautions: Fall Precaution Comments: Impella, condom cath, Gordy Councilman Restrictions Weight Bearing Restrictions: Yes RUE Weight Bearing: Touch down weight bearing Other Position/Activity Restrictions: R impella    Mobility  Bed Mobility Overal bed mobility: Needs Assistance Bed Mobility: Sit to Supine Rolling: Min assist         General bed mobility comments: pt in chair on arrival; pt min assist +1 for LE navigation for sit to supine  Transfers Overall transfer level: Needs assistance Equipment used: Rolling walker (2 wheeled) Transfers: Sit to/from [employer] Transfers Sit to Stand: +2 safety/equipment;Min assist Stand pivot transfers: +2 safety/equipment;Min assist       General transfer comment: min assist +2 for line/lead management, nurse was handling [employer] line while PT performed min assist for cues to not push through the R UE and trunk assist  Ambulation/Gait Ambulation/Gait assistance: Min assist;+2 safety/equipment Gait Distance (Feet): 150 Feet   Gait Pattern/deviations: Drifts right/left;Decreased stride length Gait velocity: decreased   General Gait Details: pt +5 for ambulation (1 for chair follow, 1 nurse to hold sw[place], 2 to push Impella equipment and lines and lead management, 1 PT for physical assistance); pt required min assist for posterior cues to stand up straight; pt had 2 standing rest breaks that were ~3 minutes long during gait   Stairs             Wheelchair Mobility    Modified Rankin (Stroke Patients Only)  Balance Overall balance assessment: Needs assistance Sitting-balance support: Feet supported;No upper extremity supported Sitting balance-Leahy Scale: Fair Sitting balance - Comments: pt min guard for sitting EOB   Standing balance support: Bilateral upper extremity supported;During  functional activity Standing balance-Leahy Scale: Poor Standing balance comment: relies on UE support from RW for balance and min assist +1                            Cognition Arousal/Alertness: Awake/alert Behavior During Therapy: WFL for tasks assessed/performed Overall Cognitive Status: Within Functional Limits for tasks assessed                                        Exercises General Exercises - Lower Extremity Long Arc Quad: AROM;Strengthening;Both;5 reps;Seated Straight Leg Raises: AROM;Strengthening;Both;5 reps;Seated    General Comments        Pertinent Vitals/Pain Pain Assessment: Faces Faces Pain Scale: Hurts a little bit Pain Location: R arm Pain Descriptors / Indicators: Grimacing;Guarding Pain Intervention(s): Monitored during session;Limited activity within patient's tolerance    Home Living                      Prior Function            PT Goals (current goals can now be found in the care plan section) Acute Rehab PT Goals Patient Stated Goal: Hoping to go home Progress towards PT goals: Progressing toward goals    Frequency    Min 3X/week      PT Plan Current plan remains appropriate    Co-evaluation              AM-PAC PT "6 Clicks" Mobility   Outcome Measure  Help needed turning from your back to your side while in a flat bed without using bedrails?: A Little Help needed moving from lying on your back to sitting on the side of a flat bed without using bedrails?: A Little Help needed moving to and from a bed to a chair (including a wheelchair)?: A Little Help needed standing up from a chair using your arms (e.g., wheelchair or bedside chair)?: A Little Help needed to walk in hospital room?: A Little Help needed climbing 3-5 steps with a railing? : Total 6 Click Score: 16    End of Session Equipment Utilized During Treatment: Gait belt Activity Tolerance: Patient tolerated treatment  well Patient left: in bed;with nursing/sitter in room;with family/visitor present;with call bell/phone within reach Nurse Communication: Mobility status PT Visit Diagnosis: Muscle weakness (generalized) (M62.81);Difficulty in walking, not elsewhere classified (R26.2)     Time: 0737-1062 PT Time Calculation (min) (ACUTE ONLY): 35 min  Charges:  $Gait Training: 8-22 mins                     [place], SPT [id number]   [place] 05/11/2020, 11:45 AM

## 2020-05-11 NOTE — Progress Notes (Signed)
ANTICOAGULATION CONSULT NOTE  Pharmacy Consult for Heparin  Indication: Impella and hx PE  No Known Allergies  Patient Measurements: Height: 6' (182.9 cm) Weight: 69.5 kg (153 lb 3.5 oz) IBW/kg (Calculated) : 77.6 Heparin Dosing Weight: 60kg  Vital Signs: Temp: 99.3 F (37.4 C) (12/08 1900) Temp Source: Core (Comment) (12/08 1200) Pulse Rate: 56 (12/08 1900)  Labs: Recent Labs    05/09/20 0457 05/09/20 2117 05/10/20 0346 05/10/20 1714 05/11/20 0356 05/11/20 1907  HGB   < > 9.2* 8.8*  --  8.7*  --   HCT  --  29.2* 28.3*  --  28.3*  --   PLT  --  142* 131*  --  143*  --   APTT   < >  --  65* 55* 87*  --   HEPARINUNFRC   < >  --  0.95*  --  0.76* 0.53  CREATININE  --  0.83 0.76  --  0.80  --    < > = values in this interval not displayed.    Estimated Creatinine Clearance: 79.6 mL/min (by C-G formula based on SCr of 0.8 mg/dL).   Assessment: 74 yo Male with hx recent PE (03/2020) on apixaban PTA admitted with ADHF now s/p Impella placement, for heparin.   RN reported issues with Impella purge flow rate 12/5  but those have resolved.  Purge flow back to 11.4 ml/hr = 570 uts/hr and systemic heparin 350 uts/hr, total heparin 920 uts/hr.  Heparin level 0.53 - still with some false elevation from apixaban, aPTT 60 - just slightly below goal range. Still oozy at the site - but stable over the last 24 hours. Hgb low but stable, plt 143.   Goal of Therapy:  APTT 66-85 Monitor platelets by anticoagulation protocol: Yes   Plan:  Continue IV heparin to 350 units/hr systemically - if aPTT remains low tomorrow and no change in bleeding consider increase. Continue aPTT/Heparin levels q 12 hrs. Daily CBC.  Antonietta Jewel, PharmD, [place] Clinical Pharmacist  Phone: [phone] 05/11/2020 7:58 PM  Please check AMION for all [place] phone numbers After 10:00 PM, call [place] [phone]

## 2020-05-11 NOTE — Progress Notes (Signed)
Hypoglycemic Event  CBG: 68, 76  Treatment: patient encouraged to eat meals and given ensure and cranberry juice. Recheck CBG 76. Pt given more cranberry juice and 59ml D50 per protocol.   Symptoms: pt reports he is cold.   Follow-up CBG: Time: 1700 CBG Result: 126   Possible Reasons for Event: poor appetite

## 2020-05-11 NOTE — Progress Notes (Signed)
[facility] for Heparin  Indication: Impella and hx PE  No Known Allergies  Patient Measurements: Height: 6' (182.9 cm) Weight: 69.5 kg (153 lb 3.5 oz) IBW/kg (Calculated) : 77.6 Heparin Dosing Weight: 60kg  Vital Signs: Temp: 99 F (37.2 C) (12/08 1200) Temp Source: Core (Comment) (12/08 1200) Pulse Rate: 59 (12/08 1200)  Labs: Recent Labs    05/09/20 0457 05/09/20 0740 05/09/20 2117 05/09/20 2117 05/10/20 0346 05/10/20 1714 05/11/20 0356  HGB 7.5*   < > 9.2*   < > 8.8*  --  8.7*  HCT 24.6*   < > 29.2*  --  28.3*  --  28.3*  PLT 113*   < > 142*  --  131*  --  143*  APTT 64*   < >  --   --  65* 55* 87*  HEPARINUNFRC 0.98*  --   --   --  0.95*  --  0.76*  CREATININE  --    < > 0.83  --  0.76  --  0.80   < > = values in this interval not displayed.    Estimated Creatinine Clearance: 79.6 mL/min (by C-G formula based on SCr of 0.8 mg/dL).   Assessment: 74 yo Male with hx recent PE (03/2020) on apixaban PTA admitted with ADHF now s/p Impella placement, for heparin.   RN reported issues with Impella purge flow rate 12/5  but those have resolved.  Purge flow back to 11.9 ml/hr = 595 uts/hr and systemic heparin 350 uts/hr, total heparin 945 uts/hr. Heparin level 0.76 - still with some false elevation from apixaban, aPTT = 87 - just above goal range.  Hgb low but stable.  Pltc okay  Goal of Therapy:  APTT 66-85 Monitor platelets by anticoagulation protocol: Yes   Plan:  Decrease IV heparin to 350 units/hr systemically. Continue aPTT/Heparin levels q 12 hrs. Daily CBC.  [place] Crane, Roylene Reason, BCCP Clinical Pharmacist  05/11/2020 1:00 PM   [facility] pharmacy phone numbers are listed on [email or web address]

## 2020-05-11 NOTE — Progress Notes (Signed)
Occupational Therapy Treatment Patient Details Name: Cody Arellano MRN: [id number] DOB: May 18, 1946 Today's Date: 05/11/2020    History of present illness Pt is a 74 y.o. male with medical history significant for lupus, interstitial lung disease, coronary artery disease, chronic systolic CHF, paroxysmal atrial fibrillation on Eliquis, and recent PE who presented to the emergency department due to shortness of breath that has been ongoing for about 2 weeks associated with nausea and intermittent nonbilious, nonbloody vomiting ~2-3 times per week. Pt had cardiogenic shock and impella placed 11/30; heart cath on 12/1. CXR 11/30: Marked worsening of airspace disease throughout the right chest most worrisome for pneumonia. CXR 12/1: Significant interval clearing of airspace opacity on the right as well as milder clearing on the left. No new opacity evident. CXR 12/2: Stable bibasilar subsegmental atelectasis. ETT 11/30-12/2.     OT comments  Patient continues to present with the barriers below.  He is demonstrating improved activity tolerance and mobility this date.  Able to take steps with +4 to manage lines and leads.  Patient very motivated and verbalizes a strong desire to move better and regain his independence.  He still requires up to Mod A for upper body ADL and Max A for lower bady ADL, but hopefully his improved mobility will begin to increase his ADL independence.  OT to continue in the acute setting.  CIR has been recommended.    Follow Up Recommendations  CIR;Supervision/Assistance - 24 hour    Equipment Recommendations  3 in 1 bedside commode;Wheelchair (measurements OT);Wheelchair cushion (measurements OT)    Recommendations for Other Services      Precautions / Restrictions Precautions Precautions: Fall Precaution Comments: Impella, condom cath, Gordy Councilman Restrictions Weight Bearing Restrictions: Yes RUE Weight Bearing: Touch down weight bearing Other Position/Activity Restrictions:  R impella       Mobility Bed Mobility Overal bed mobility: Needs Assistance Bed Mobility: Sit to Supine Rolling: Min assist         General bed mobility comments: pt in chair on arrival; pt min assist +1 for LE navigation for sit to supine  Transfers Overall transfer level: Needs assistance Equipment used: Rolling walker (2 wheeled) Transfers: Sit to/from [employer] Sit to Stand: +2 safety/equipment;Min assist Stand pivot transfers: +2 safety/equipment;Min assist       General transfer comment: min assist +2 for line/lead management, nurse was handling [employer] line while PT performed min assist for cues to not push through the R UE and trunk assist    Balance Overall balance assessment: Needs assistance Sitting-balance support: Feet supported;No upper extremity supported Sitting balance-Leahy Scale: Fair Sitting balance - Comments: pt min guard for sitting EOB   Standing balance support: Bilateral upper extremity supported;During functional activity Standing balance-Leahy Scale: Poor Standing balance comment: relies on UE support from RW for balance and min assist +1                           ADL either performed or assessed with clinical judgement   ADL   Eating/Feeding: Set up;Sitting   Grooming: Wash/dry hands;Wash/dry face;Set up;Sitting                               Functional mobility during ADLs: +2 for physical assistance;+2 for safety/equipment;Minimal assistance       Vision       Perception     Praxis  Cognition Arousal/Alertness: Awake/alert Behavior During Therapy: WFL for tasks assessed/performed Overall Cognitive Status: Within Functional Limits for tasks assessed                                          Exercises    Shoulder Instructions       General Comments      Pertinent Vitals/ Pain       Pain Assessment: No/denies pain Faces Pain Scale: Hurts a little  bit Pain Location: R arm Pain Descriptors / Indicators: Grimacing;Guarding Pain Intervention(s): Monitored during session  Home Living                                          Prior Functioning/Environment              Frequency  Min 2X/week        Progress Toward Goals  OT Goals(current goals can now be found in the care plan section)  Progress towards OT goals: Progressing toward goals  Acute Rehab OT Goals Patient Stated Goal: move better and get stronger OT Goal Formulation: With patient Time For Goal Achievement: 05/20/20 Potential to Achieve Goals: [place] Discharge plan remains appropriate    Co-evaluation                 AM-PAC OT "6 Clicks" Daily Activity     Outcome Measure   Help from another person eating meals?: None Help from another person taking care of personal grooming?: A Little Help from another person toileting, which includes using toliet, bedpan, or urinal?: A Lot Help from another person bathing (including washing, rinsing, drying)?: A Lot Help from another person to put on and taking off regular upper body clothing?: A Lot Help from another person to put on and taking off regular lower body clothing?: A Lot 6 Click Score: 15    End of Session    OT Visit Diagnosis: Other abnormalities of gait and mobility (R26.89);Muscle weakness (generalized) (M62.81);Pain   Activity Tolerance Patient tolerated treatment well   Patient Left with call bell/phone within reach;with nursing/sitter in room;with family/visitor present;in bed   Nurse Communication          Time: 0935-1010 OT Time Calculation (min): 35 min  Charges: OT General Charges $OT Visit: 1 Visit OT Treatments $Self Care/Home Management : 8-22 mins  05/11/2020  [place], OTR/L  Acute Rehabilitation Services  Office:  [place] 05/11/2020, 12:03 PM

## 2020-05-12 ENCOUNTER — Inpatient Hospital Stay (HOSPITAL_COMMUNITY): Payer: No Typology Code available for payment source

## 2020-05-12 DIAGNOSIS — I5023 Acute on chronic systolic (congestive) heart failure: Secondary | ICD-10-CM

## 2020-05-12 DIAGNOSIS — Z95811 Presence of heart assist device: Secondary | ICD-10-CM | POA: Diagnosis not present

## 2020-05-12 DIAGNOSIS — N179 Acute kidney failure, unspecified: Secondary | ICD-10-CM | POA: Diagnosis not present

## 2020-05-12 LAB — LACTATE DEHYDROGENASE: LDH: 236 U/L — ABNORMAL HIGH (ref 98–192)

## 2020-05-12 LAB — APTT: aPTT: 64 seconds — ABNORMAL HIGH (ref 24–36)

## 2020-05-12 LAB — BASIC METABOLIC PANEL
Anion gap: 7 (ref 5–15)
BUN: 25 mg/dL — ABNORMAL HIGH (ref 8–23)
CO2: 22 mmol/L (ref 22–32)
Calcium: 8.2 mg/dL — ABNORMAL LOW (ref 8.9–10.3)
Chloride: 105 mmol/L (ref 98–111)
Creatinine, Ser: 0.78 mg/dL (ref 0.61–1.24)
GFR, Estimated: >60 mL/min (ref >60)
Glucose, Bld: 108 mg/dL — ABNORMAL HIGH (ref 70–99)
Potassium: 5 mmol/L (ref 3.5–5.1)
Sodium: 134 mmol/L — ABNORMAL LOW (ref 135–145)

## 2020-05-12 LAB — CBC
HCT: 28.7 % — ABNORMAL LOW (ref 39.0–52.0)
Hemoglobin: 8.9 g/dL — ABNORMAL LOW (ref 13.0–17.0)
MCH: 26.7 pg (ref 26.0–34.0)
MCHC: 31 g/dL (ref 30.0–36.0)
MCV: 86.2 fL (ref 80.0–100.0)
Platelets: 191 10*3/uL (ref 150–400)
RBC: 3.33 MIL/uL — ABNORMAL LOW (ref 4.22–5.81)
RDW: 20 % — ABNORMAL HIGH (ref 11.5–15.5)
WBC: 6.4 10*3/uL (ref 4.0–10.5)
nRBC: 1.6 % — ABNORMAL HIGH (ref 0.0–0.2)

## 2020-05-12 LAB — ECHOCARDIOGRAM LIMITED
Height: 72 in
Weight: 2497.37 oz

## 2020-05-12 LAB — GLUCOSE, CAPILLARY
Glucose-Capillary: 105 mg/dL — ABNORMAL HIGH (ref 70–99)
Glucose-Capillary: 91 mg/dL (ref 70–99)
Glucose-Capillary: 91 mg/dL (ref 70–99)
Glucose-Capillary: 83 mg/dL (ref 70–99)
Glucose-Capillary: 99 mg/dL (ref 70–99)
Glucose-Capillary: 108 mg/dL — ABNORMAL HIGH (ref 70–99)

## 2020-05-12 LAB — COOXEMETRY PANEL
Carboxyhemoglobin: 3 % — ABNORMAL HIGH (ref 0.5–1.5)
Carboxyhemoglobin: 2.6 % — ABNORMAL HIGH (ref 0.5–1.5)
Carboxyhemoglobin: 2.6 % — ABNORMAL HIGH (ref 0.5–1.5)
Methemoglobin: 0.8 % (ref 0.0–1.5)
Methemoglobin: 0.7 % (ref 0.0–1.5)
Methemoglobin: 1 % (ref 0.0–1.5)
O2 Saturation: 93.7 %
O2 Saturation: 54.9 %
O2 Saturation: 57 %
Total hemoglobin: 8.6 g/dL — ABNORMAL LOW (ref 12.0–16.0)
Total hemoglobin: 11.9 g/dL — ABNORMAL LOW (ref 12.0–16.0)
Total hemoglobin: 9.5 g/dL — ABNORMAL LOW (ref 12.0–16.0)

## 2020-05-12 LAB — ECHO TEE
MV M vel: 5.02 m/s
MV Peak grad: 100.8 mmHg
Radius: 0.6 cm

## 2020-05-12 LAB — HEPARIN LEVEL (UNFRACTIONATED): Heparin Unfractionated: 0.57 IU/mL (ref 0.30–0.70)

## 2020-05-12 LAB — PHOSPHORUS: Phosphorus: 2.2 mg/dL — ABNORMAL LOW (ref 2.5–4.6)

## 2020-05-12 LAB — MAGNESIUM: Magnesium: 1.9 mg/dL (ref 1.7–2.4)

## 2020-05-12 MED ORDER — BISACODYL 10 MG RE SUPP
10.0000 mg | Freq: Once | RECTAL | Status: AC
  Administered 2020-05-12: 10 mg via RECTAL

## 2020-05-12 MED ORDER — DOCUSATE SODIUM 100 MG PO CAPS
100.0000 mg | ORAL_CAPSULE | Freq: Two times a day (BID) | ORAL | Status: DC
  Administered 2020-05-12 – 2020-05-23 (×20): 100 mg via ORAL
  Filled 2020-05-12 (×20): qty 1

## 2020-05-12 MED ORDER — DRONABINOL 2.5 MG PO CAPS
2.5000 mg | ORAL_CAPSULE | Freq: Two times a day (BID) | ORAL | Status: DC
  Administered 2020-05-12 – 2020-05-23 (×22): 2.5 mg via ORAL
  Filled 2020-05-12 (×22): qty 1

## 2020-05-12 NOTE — Plan of Care (Signed)
  Problem: Clinical Measurements: Goal: Ability to maintain clinical measurements within normal limits will improve Outcome: Progressing Goal: Will remain free from infection Outcome: Progressing Goal: Diagnostic test results will improve Outcome: Progressing Goal: Respiratory complications will improve Outcome: Progressing Goal: Cardiovascular complication will be avoided Outcome: Progressing   Problem: Nutrition: Goal: Adequate nutrition will be maintained Outcome: Progressing   Problem: Coping: Goal: Level of anxiety will decrease Outcome: Progressing   Problem: Elimination: Goal: Will not experience complications related to bowel motility Outcome: Progressing Goal: Will not experience complications related to urinary retention Outcome: Progressing   Problem: Pain Managment: Goal: General experience of comfort will improve Outcome: Progressing   Problem: Safety: Goal: Ability to remain free from injury will improve Outcome: Progressing   Problem: Skin Integrity: Goal: Risk for impaired skin integrity will decrease Outcome: Progressing   Problem: Education: Goal: Understanding of CV disease, CV risk reduction, and recovery process will improve Outcome: Progressing Goal: Individualized Educational Video(s) Outcome: Progressing   Problem: Cardiovascular: Goal: Ability to achieve and maintain adequate cardiovascular perfusion will improve Outcome: Progressing Goal: Vascular access site(s) Level 0-1 will be maintained Outcome: Progressing

## 2020-05-12 NOTE — Progress Notes (Signed)
Cody Arellano for Heparin  Indication: Impella and hx PE  No Known Allergies  Patient Measurements: Height: 6' (182.9 cm) Weight: 70.8 kg (156 lb 1.4 oz) IBW/kg (Calculated) : 77.6 Heparin Dosing Weight: 60kg  Vital Signs: Temp: 98.96 F (37.2 C) (12/09 1300) BP: 88/61 (12/09 0230) Pulse Rate: 62 (12/09 1300)  Labs: Recent Labs    05/10/20 0346 05/10/20 1714 05/11/20 0356 05/11/20 1907 05/12/20 0441  HGB 8.8*  --  8.7*  --  8.9*  HCT 28.3*  --  28.3*  --  28.7*  PLT 131*  --  143*  --  191  APTT 65*   < > 87* 60* 64*  HEPARINUNFRC 0.95*  --  0.76* 0.53 0.57  CREATININE 0.76  --  0.80  --  0.78   < > = values in this interval not displayed.    Estimated Creatinine Clearance: 81.1 mL/min (by C-G formula based on SCr of 0.78 mg/dL).   Assessment: Cody Arellano with hx recent PE (03/2020) on apixaban PTA admitted with ADHF now s/p Impella placement, for heparin.   RN reported issues with Impella purge flow rate 12/5  but those have resolved.  Purge flow back to 11.6 ml/hr = 580 uts/hr and systemic heparin 350 uts/hr, total heparin 930 uts/hr.  Heparin level 0.57; aPTT 64 - suspect influence of Eliquis on heparin levels is now gone.  Hgb low but stable, plt 191. LDH 236 (stable)  Goal of Therapy:  APTT 66-85 Monitor platelets by anticoagulation protocol: Yes   Plan:  Continue IV heparin to 350 units/hr systemically - if aPTT remains low tomorrow and no change in bleeding consider increase. Continue aPTT/Heparin levels q 12 hrs. Daily CBC.  Cody Arellano Reason, BCCP Clinical Pharmacist  05/12/2020 2:02 PM   [facility] pharmacy phone numbers are listed on [email or web address]

## 2020-05-12 NOTE — Progress Notes (Signed)
Patient ID: Cody Arellano, male   DOB: 1945/12/20, 74 y.o.   MRN: [id number]     Advanced Heart Failure Rounding Note  PCP-Cardiologist: Mertie Moores, MD   Subjective:    Impella 5.5 placed on 11/30 with cardiogenic shock.  Off norepinephrine and milrinone.  Back on heparin systemically and in purge.  I/Os negative.    VT 12/7 early am had DCCV, converted back to afib.  Later went into a junctional rhythm and amiodarone stopped, then converted to NSR.     CXR with possible PNA right lung.  He has completed course of vancomycin/cefepime, now afebrile.   No complaints this morning, no dyspnea.  Walked down hall yesterday.  Eating about half of meals and has TFs running via [place] at night.  No BM.   PLTs 99 -> 102 -> 113 -> 131 -> 143 -> 191.  HIT negative. He got 1u RBCs 12/4 and again 12/6. hgb 7.4 -> 7.8 -> 7.5 -> 8.8 -> 8.7 -> 8.9.   TEE-guided DCCV on 12/3.  Echo with mildly dilated LV, EF 20%, mildly dilated RV with moderately decreased systolic function, moderate MR.   - Echo: EF 20-25% with septal akinesis, mid to apical inferior akinesis, apical anterior and apex akinesis.  Moderately dilated and dysfunctional RV.  Moderate TR.  Severe MR.  Bicuspid aortic valve with no stenosis, mild aortic insufficiency.  - LHC:  1. Dominant LCx, no significant disease in this vessel.  2. 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  The LIMA-LAD is also present.  3. The SVG-ramus has been occluded.  There is severe and diffuse in-stent restenosis in the proximal ramus.   Impella 5.5 P5  Flow 3 L, no alarms LDH 231 -> 251 -> 236  Swan numbers: CVP 3-4 PA 42/12 CI 2.2 Co-ox 55%  Objective:   Weight Range: 70.8 kg Body mass index is 21.17 kg/m.   Vital Signs:   Temp:  [98.42 F (36.9 C)-99.3 F (37.4 C)] 98.42 F (36.9 C) (12/09 0800) Pulse Rate:  [54-111] 63 (12/09 0800) Resp:  [15-25] 23 (12/09 0800) BP: (88)/(61) 88/61 (12/09 0230) SpO2:  [73 %-100 %] 100  % (12/09 0800) Arterial Line BP: (86-222)/(45-211) 125/62 (12/09 0800) Weight:  [70.8 kg] 70.8 kg (12/09 0415) Last BM Date: 05/01/20  Weight change: Filed Weights   05/09/20 0500 05/10/20 0439 05/12/20 0415  Weight: 69.1 kg 69.5 kg 70.8 kg    Intake/Output:   Intake/Output Summary (Last 24 hours) at 05/12/2020 0835 Last data filed at 05/12/2020 0800 Gross per 24 hour  Intake 1645.4 ml  Output 1200 ml  Net 445.4 ml      Physical Exam    General: NAD, frail Neck: No JVD, no thyromegaly or thyroid nodule.  Lungs: Clear to auscultation bilaterally with normal respiratory effort. CV: Lateral PMI.  Heart regular S1/S2, no S3/S4, 1/6 HSM apex.  No peripheral edema.   Abdomen: Soft, nontender, no hepatosplenomegaly, no distention.  Skin: Intact without lesions or rashes.  Neurologic: Alert and oriented x 3.  Psych: Normal affect. Extremities: No clubbing or cyanosis.  HEENT: Normal.    Telemetry    Sinus 50s Personally reviewed   Labs    CBC Recent Labs    05/11/20 0356 05/12/20 0441  WBC 6.7 6.4  HGB 8.7* 8.9*  HCT 28.3* 28.7*  MCV 86.0 86.2  PLT 143* 366   Basic Metabolic Panel Recent Labs    05/09/20 2117 05/10/20 0346 05/10/20 2011 05/11/20 0356  05/12/20 0441  NA 134*   < >  --  135 134*  K 3.9   < >  --  4.7 5.0  CL 103   < >  --  105 105  CO2 21*   < >  --  24 22  GLUCOSE 165*   < >  --  103* 108*  BUN 20   < >  --  25* 25*  CREATININE 0.83   < >  --  0.80 0.78  CALCIUM 7.9*   < >  --  7.8* 8.2*  MG 2.0  --   --   --  1.9  PHOS 1.4*  --  1.9*  --  2.2*   < > = values in this interval not displayed.   Liver Function Tests Recent Labs    05/09/20 2117  AST 97*  ALT 83*  ALKPHOS 90  BILITOT 1.4*  PROT 5.4*  ALBUMIN 1.8*   No results for input(s): LIPASE, AMYLASE in the last 72 hours. Cardiac Enzymes No results for input(s): CKTOTAL, CKMB, CKMBINDEX, TROPONINI in the last 72 hours.  BNP: BNP (last 3 results) Recent Labs     04/06/20 2134 04/26/20 1319 04/30/20 0248  BNP 1,677.6* 1,824.4* 1,685.5*    ProBNP (last 3 results) No results for input(s): PROBNP in the last 8760 hours.   D-Dimer No results for input(s): DDIMER in the last 72 hours. Hemoglobin A1C No results for input(s): HGBA1C in the last 72 hours. Fasting Lipid Panel No results for input(s): CHOL, HDL, LDLCALC, TRIG, CHOLHDL, LDLDIRECT in the last 72 hours. Thyroid Function Tests No results for input(s): TSH, T4TOTAL, T3FREE, THYROIDAB in the last 72 hours.  Invalid input(s): FREET3  Other results:   Imaging    DG CHEST PORT 1 VIEW  Result Date: 05/12/2020 CLINICAL DATA:  Sore chest left ventricular assist device. EXAM: PORTABLE CHEST 1 VIEW COMPARISON:  05/07/2020. FINDINGS: Feeding tube, Swan-Ganz catheter, right PICC line, Impella device in stable position. Stable cardiomegaly. Pulmonary venous congestion. Bilateral interstitial prominence improved from prior exam suggesting improving interstitial edema. Small right pleural effusion. Right costophrenic angle incompletely imaged. No pneumothorax. Surgical clips noted over the right chest. IMPRESSION: 1. Lines and tubes in stable position. 2. Impella device in stable position. Cardiomegaly with pulmonary venous congestion. Bilateral interstitial prominence improved from prior exam suggesting improving interstitial edema. Small right pleural effusion. Electronically Signed   By: Marcello Moores  Register   On: 05/12/2020 07:40     Medications:     Scheduled Medications: . sodium chloride   Intravenous Once  . amiodarone  200 mg Oral BID  . aspirin  81 mg Oral Daily  . chlorhexidine  15 mL Mouth Rinse BID  . Chlorhexidine Gluconate Cloth  6 each Topical Daily  . digoxin  0.125 mg Oral Daily  . docusate  100 mg Oral BID  . dronabinol  2.5 mg Oral BID AC  . feeding supplement  237 mL Oral BID BM  . feeding supplement (PROSource TF)  45 mL Per Tube QID  . losartan  12.5 mg Oral BID  .  mouth rinse  15 mL Mouth Rinse q12n4p  . multivitamin with minerals  1 tablet Oral Daily  . potassium chloride  40 mEq Per Tube BID  . rosuvastatin  20 mg Oral Daily  . sodium chloride flush  10-40 mL Intracatheter Q12H  . sodium chloride flush  3 mL Intravenous Q12H  . spironolactone  12.5 mg Oral Daily  Infusions: . sodium chloride Stopped (05/06/20 0858)  . sodium chloride    . sodium chloride Stopped (05/12/20 0447)  . sodium chloride Stopped (05/12/20 0447)  . sodium chloride Stopped (05/12/20 0445)  . dextrose Stopped (05/06/20 2218)  . feeding supplement (VITAL 1.5 CAL) Stopped (05/12/20 0600)  . heparin 350 Units/hr (05/12/20 0800)  . impella catheter heparin 50 unit/mL in dextrose 5%    . lactated ringers Stopped (05/11/20 0800)  . norepinephrine (LEVOPHED) Adult infusion 2 mcg/min (05/12/20 0700)    PRN Medications: Place/Maintain arterial line **AND** sodium chloride, sodium chloride, acetaminophen (TYLENOL) oral liquid 160 mg/5 mL, bisacodyl, fentaNYL (SUBLIMAZE) injection, lidocaine, ondansetron (ZOFRAN) IV, oxyCODONE, polyethylene glycol, Resource ThickenUp Clear, sodium chloride flush, sodium chloride flush, white petrolatum   Assessment/Plan   1. 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  Echo this admission looks worse than 10/21 with EF 20-25% with septal akinesis, mid to apical inferior akinesis, apical anterior and apex akinesis, moderately dilated and dysfunctional RV, moderate TR, severe MR, bicuspid aortic valve with no stenosis, mild aortic insufficiency. Progressive/end-stage CHF worsened by onset of atrial fibrillation. Cardiogenic shock 11/30 with co-ox down to 30%, Impella 5.5 placed to allow time to stabilize and consider further steps.  Currently with CI 2.2 and co-ox 55% on P5 Impella with no alarms. Waveforms look good. On heparin gtt. CVP low at 3-4. LDH stable, no evidence for significant hemolysis.  - Off NE/milrinone.    - No Lasix for now.  - Decrease Impella speed to P4, plan slow wean.  Would like to try to remove early next week. Repeat thermo cardiac index and co-ox later today, if these drop will add low dose milrinone 0.125.   - Limited echo today for Impella position.  - Continue losartan 12.5 mg bid.  - Continue digoxin this morning.  - Continue spironolactone 12.5 daily.  - He has end-stage biventricular HF with cardiogenic shock physiology in the setting of AF/RVR and severe MR and advanced ischemic CM.  He now s/p Impella 5.5 to support DC-CV and medical optimization.  Now off inotropes. Maintaining NSR. He is not a transplant candidate.  Barriers to LVAD include nutritional status/deconditioning and RV.  I think RV would be acceptable.  He is getting tube feeds and Ensure.  We discussed in [place], not LVAD candidate at this point with malnutrition/deconditioning.  Will try to wean Impella potentially to milrinone to allow time to improve nutritional status and mobility (hopefully to CIR).  - Palliative Care has seen and continue to discuss [place] 2. CAD: Prior history of MI with LAD and ramus PCI. Admitted May 2021 with anterior STEMI. LHC with occluded ostial LAD (in-stent), 90% ostial ramus, 60-70% proximal LCx, nondominant RCA. PTCA to LAD and ramus to restore flow, then CABG with LIMA-LAD and SVG-ramus.  No chest pain, doubt ACS.  Fall in EF from 10/21 to 11/21, but he did not present with ACS.  [place] 12/1 showed patient LIMA-LAD, patent native LAD, patent dominant LCx.  The SVG-ramus is occluded with severe diffuse in-stent restenosis in proximal ramus.  Ramus not intervened upon as this would be a complex intervention and unlikely to markedly improve EF. No chest pain.  - Continue ASA 81 and statin.    3. AKI: Creatinine now trending down with improved cardiac output. Renal function normalized.   4. Right pleural effusion: S/p thoracentesis, transudative (CHF).  5. Atrial fibrillation/flutter with RVR:  DCCV on 12/3, went back to NSR 12/7. - Continue amiodarone at  200 mg bid.   -  IV heparin.  6. Mitral regurgitation:  TEE 12/3 with moderate central MR.  Doubt MitraClip will help him much with moderate MR and he is likely too advanced.  7. H/o PE: In 10/21.  Keep anticoagulated, on heparin gtt.  8. Bicuspid aortic valve: Mild AI, no AS.  9. SLE: H/o pericarditis.  On Imuran and hydroxychloroquine on. Holding hydroxychloroquine for now with risk for QT prolongation.  10. Pulmonary nodules/emphysema: CCM following, s/p bronch/BAL (no growth).  11. ID: Concern for right-sided PNA, ?aspiration.  Cultures NGTD.  Now afebrile and has completed course of vancomycin/cefepime.   12. Thrombocytopenia: Resolved.  May have been due to critical illness, low grade hemolysis with Impella. LDH ok.  HIT negative.  - Back on heparin gtt.  13. Hypokalemia/hypomag: Supplement as needed.  14. Acute blood loss anemia: No obvious site.  Looks like less swelling at Impella site.  Hgb 8.9 today.  - Transfuse hgb < 8.  15. VT: Episode early am 12/7, required DCCV.  No recurrence.   CRITICAL CARE Performed by: Loralie Champagne  Total critical care time: 40 minutes  Critical care time was exclusive of separately billable procedures and treating other patients.  Critical care was necessary to treat or prevent imminent or life-threatening deterioration.  Critical care was time spent personally by me (independent of midlevel providers or residents) on the following activities: development of treatment plan with patient and/or surrogate as well as nursing, discussions with consultants, evaluation of patient's response to treatment, examination of patient, obtaining history from patient or surrogate, ordering and performing treatments and interventions, ordering and review of laboratory studies, ordering and review of radiographic studies, pulse oximetry and re-evaluation of patient's condition.   Length of Stay:  [place], MD  05/12/2020, 8:35 AM  Advanced Heart Failure Team Pager [phone] (M-F; 7a - 4p)  Please contact [facility] for night-coverage after hours (4p -7a ) and weekends on [email or web address]

## 2020-05-12 NOTE — Progress Notes (Signed)
  Echocardiogram 2D Echocardiogram has been performed.  Cody Arellano 05/12/2020, 10:34 AM

## 2020-05-12 NOTE — Progress Notes (Signed)
Palliative:  Mr. Dusenbery is sleeping and no family seen at bedside. I did not awaken Mr. Theiler at this time. At this time I will await to hear back from family if they wish to discuss goals of care further. Mr. Garringer is often sleepy and not up to much conversation during my visits. Will be available to Mr. Flanagin and family when they are interested in further conversation.   Otherwise goals at this time are for full code and full aggressive care for hopes of improvement as previously expressed to me by Mr. Goyne.   No charge  Vinie Sill, NP Palliative Medicine Team Pager [phone] (Please see [email or web address] for schedule) Team Phone [phone]

## 2020-05-12 NOTE — Progress Notes (Signed)
Physical Therapy Treatment Patient Details Name: Cody Arellano MRN: [id number] DOB: February 08, 1946 Today's Date: 05/12/2020    History of Present Illness Pt is a 74 y.o. male with medical history significant for lupus, interstitial lung disease, coronary artery disease, chronic systolic CHF, paroxysmal atrial fibrillation on Eliquis, and recent PE who presented to the emergency department due to shortness of breath that has been ongoing for about 2 weeks associated with nausea and intermittent nonbilious, nonbloody vomiting ~2-3 times per week. Pt had cardiogenic shock and impella placed 11/30; heart cath on 12/1. CXR 11/30: Marked worsening of airspace disease throughout the right chest most worrisome for pneumonia. CXR 12/1: Significant interval clearing of airspace opacity on the right as well as milder clearing on the left. No new opacity evident. CXR 12/2: Stable bibasilar subsegmental atelectasis. ETT 11/30-12/2.      PT Comments    Pt continues to be motivated to work with therapy to progress mobility, and has already ambulated this morning. Pt affect is slightly flatter than last treatment session with this therapist present and has decreased awareness of deficits in ambulation safety today requiring cuing for wider BoS to keep from tripping over his feet. Pt requires assist x3 for management of lines and close chair follow. Pt requires physical assist minAx1 for bed mobility, modAx2 for transfers and min Ax1 for steadying with ambulation using RW.  Pt with mild SoB at end of session however VSS on RA. PT continues to recommend CIR level rehab to progress safety and improve strength and mobility.    Follow Up Recommendations  CIR;Supervision/Assistance - 24 hour     Equipment Recommendations  Rolling walker with 5" wheels;3in1 (PT)       Precautions / Restrictions Precautions Precautions: Fall Precaution Comments: Impella, condom cath, [place] Restrictions Other Position/Activity  Restrictions: R impella (can utilize R arm for stabilizing RW, avoid pushing up)    Mobility  Bed Mobility Overal bed mobility: Needs Assistance Bed Mobility: Supine to Sit     Supine to sit: HOB elevated;Min assist     General bed mobility comments: pt able to manage LE to EoB,and pivot trunk, requires min A for pad scoot of R hip to EoB, as pt can not push through R arm  Transfers Overall transfer level: Needs assistance Equipment used: Rolling walker (2 wheeled) Transfers: Sit to/from [facility] Sit to Stand: +2 safety/equipment;Mod assist;From elevated surface         General transfer comment: modAx2 for use of pad to elevate hips, vc for use of L hand to push up on RW  Ambulation/Gait Ambulation/Gait assistance: Min assist;+2 safety/equipment Gait Distance (Feet): 200 Feet Assistive device: Rolling walker (2 wheeled) Gait Pattern/deviations: Drifts right/left;Decreased stride length;Narrow base of support;Step-through pattern Gait velocity: decreased   General Gait Details: +3 for management of Impella lines and close chair follow, PT provided minA support across low back while holding Swan Ganz in place. vc for sequencing especially with turning corners, and for increased base of support, pt requires 2x standing rest breaks dictated by therapy due to decreasing BoS likely due to hip weakness       Balance Overall balance assessment: Needs assistance Sitting-balance support: Feet supported;No upper extremity supported Sitting balance-Leahy Scale: Fair Sitting balance - Comments: pt min guard for sitting EOB   Standing balance support: Bilateral upper extremity supported;During functional activity Standing balance-Leahy Scale: Poor Standing balance comment: relies on UE support from RW for balance and min assist +1  Cognition Arousal/Alertness: Awake/alert Behavior During Therapy: WFL for tasks  assessed/performed Overall Cognitive Status: Impaired/Different from baseline Area of Impairment: Safety/judgement                         Safety/Judgement: Decreased awareness of safety;Decreased awareness of deficits     General Comments: pt with decreased deficit and safety awareness, unable to gauge increasing weakness and take/request rest break independently      Exercises      General Comments General comments (skin integrity, edema, etc.): VSS on RA      Pertinent Vitals/Pain Pain Assessment: No/denies pain           PT Goals (current goals can now be found in the care plan section) Acute Rehab PT Goals Patient Stated Goal: Hoping to go home PT Goal Formulation: With patient Time For Goal Achievement: 05/21/20 Potential to Achieve Goals: Good Progress towards PT goals: Progressing toward goals    Frequency    Min 3X/week      PT Plan Current plan remains appropriate       AM-PAC PT "6 Clicks" Mobility   Outcome Measure  Help needed turning from your back to your side while in a flat bed without using bedrails?: A Little Help needed moving from lying on your back to sitting on the side of a flat bed without using bedrails?: A Little Help needed moving to and from a bed to a chair (including a wheelchair)?: A Little Help needed standing up from a chair using your arms (e.g., wheelchair or bedside chair)?: A Little Help needed to walk in hospital room?: A Little Help needed climbing 3-5 steps with a railing? : Total 6 Click Score: 16    End of Session   Activity Tolerance: Patient tolerated treatment well Patient left: with family/visitor present;with call bell/phone within reach;in chair Nurse Communication: Mobility status PT Visit Diagnosis: Muscle weakness (generalized) (M62.81);Difficulty in walking, not elsewhere classified (R26.2)     Time: 9470-9628 PT Time Calculation (min) (ACUTE ONLY): 23 min  Charges:  $Gait Training: 23-37  mins                     Cody Arellano B. Migdalia Dk PT, DPT Acute Rehabilitation Services Pager [phone] Office [phone]    [place] 05/12/2020, 2:14 PM

## 2020-05-13 LAB — CBC
HCT: 28 % — ABNORMAL LOW (ref 39.0–52.0)
Hemoglobin: 9 g/dL — ABNORMAL LOW (ref 13.0–17.0)
MCH: 27 pg (ref 26.0–34.0)
MCHC: 32.1 g/dL (ref 30.0–36.0)
MCV: 84.1 fL (ref 80.0–100.0)
Platelets: 224 10*3/uL (ref 150–400)
RBC: 3.33 MIL/uL — ABNORMAL LOW (ref 4.22–5.81)
RDW: 20.5 % — ABNORMAL HIGH (ref 11.5–15.5)
WBC: 7.5 10*3/uL (ref 4.0–10.5)
nRBC: 1.1 % — ABNORMAL HIGH (ref 0.0–0.2)

## 2020-05-13 LAB — GLUCOSE, CAPILLARY
Glucose-Capillary: 102 mg/dL — ABNORMAL HIGH (ref 70–99)
Glucose-Capillary: 108 mg/dL — ABNORMAL HIGH (ref 70–99)
Glucose-Capillary: 114 mg/dL — ABNORMAL HIGH (ref 70–99)
Glucose-Capillary: 115 mg/dL — ABNORMAL HIGH (ref 70–99)
Glucose-Capillary: 95 mg/dL (ref 70–99)
Glucose-Capillary: 97 mg/dL (ref 70–99)

## 2020-05-13 LAB — COOXEMETRY PANEL
Carboxyhemoglobin: 2.6 % — ABNORMAL HIGH (ref 0.5–1.5)
Methemoglobin: 0.8 % (ref 0.0–1.5)
O2 Saturation: 59.6 %
Total hemoglobin: 9.2 g/dL — ABNORMAL LOW (ref 12.0–16.0)

## 2020-05-13 LAB — BASIC METABOLIC PANEL
Anion gap: 8 (ref 5–15)
BUN: 21 mg/dL (ref 8–23)
CO2: 22 mmol/L (ref 22–32)
Calcium: 8.1 mg/dL — ABNORMAL LOW (ref 8.9–10.3)
Chloride: 102 mmol/L (ref 98–111)
Creatinine, Ser: 0.78 mg/dL (ref 0.61–1.24)
GFR, Estimated: >60 mL/min (ref >60)
Glucose, Bld: 117 mg/dL — ABNORMAL HIGH (ref 70–99)
Potassium: 4.6 mmol/L (ref 3.5–5.1)
Sodium: 132 mmol/L — ABNORMAL LOW (ref 135–145)

## 2020-05-13 LAB — PHOSPHORUS: Phosphorus: 3.2 mg/dL (ref 2.5–4.6)

## 2020-05-13 LAB — LACTATE DEHYDROGENASE: LDH: 212 U/L — ABNORMAL HIGH (ref 98–192)

## 2020-05-13 LAB — HEPARIN LEVEL (UNFRACTIONATED): Heparin Unfractionated: 0.4 IU/mL (ref 0.30–0.70)

## 2020-05-13 MED ORDER — VITAL 1.5 CAL PO LIQD: 1000.0000 mL | ORAL | Status: DC

## 2020-05-13 MED ORDER — MIDODRINE HCL 5 MG PO TABS
2.5000 mg | ORAL_TABLET | Freq: Three times a day (TID) | ORAL | Status: DC
  Administered 2020-05-13 – 2020-05-15 (×7): 2.5 mg via ORAL
  Filled 2020-05-13 (×7): qty 1

## 2020-05-13 MED ORDER — VITAL 1.5 CAL PO LIQD
1000.0000 mL | ORAL | Status: DC
  Administered 2020-05-13 – 2020-05-16 (×4): 1000 mL
  Filled 2020-05-13 (×2): qty 1000

## 2020-05-13 NOTE — Progress Notes (Signed)
[place] for Heparin  Indication: Impella and hx PE  No Known Allergies  Patient Measurements: Height: 6' (182.9 cm) Weight: 68.8 kg (151 lb 10.8 oz) IBW/kg (Calculated) : 77.6 Heparin Dosing Weight: 60kg  Vital Signs: Temp: 98.24 F (36.8 C) (12/10 1400) Temp Source: Core (12/10 0800) Pulse Rate: 53 (12/10 1400)  Labs: Recent Labs    05/11/20 0356 05/11/20 1907 05/12/20 0441 05/13/20 0415  HGB 8.7*  --  8.9* 9.0*  HCT 28.3*  --  28.7* 28.0*  PLT 143*  --  191 224  APTT 87* 60* 64*  --   HEPARINUNFRC 0.76* 0.53 0.57 0.40  CREATININE 0.80  --  0.78 0.78    Estimated Creatinine Clearance: 78.8 mL/min (by C-G formula based on SCr of 0.78 mg/dL).   Assessment: 74 yo Male with hx recent PE (03/2020) on apixaban PTA admitted with ADHF now s/p Impella placement, for heparin.   RN reported issues with Impella purge flow rate 12/5  but those have resolved.  Purge flow back to 11.6 ml/hr = 580 uts/hr and systemic heparin 350 uts/hr, total heparin 930 uts/hr.  Heparin level 0.4 at gaol, apixaban effect has now washed out.   Hgb low but stable, plt 200 ok. LDH 200s (stable)  Goal of Therapy:  HL 0.3-0.5 Monitor platelets by anticoagulation protocol: Yes   Plan:  Continue IV heparin to 350 units/hr systemically Daily CBC and heparin levels   Bonnita Nasuti Pharm.D. CPP, BCPS Clinical Pharmacist [phone] 05/13/2020 2:50 PM      [facility] pharmacy phone numbers are listed on [email or web address]

## 2020-05-13 NOTE — Progress Notes (Signed)
Palliative:  HPI:74 y.o.malewith past medical history of SLE, interstitial lung disease, coronary artery disease, chronic systolic heart failure, CABG 10/2019, paroxysmal atrial fibrillation on Eliquis, recent PEadmitted on 11/23/2021with shortness of breath related to cardiogenic shock with CHF EF 20-25%, severe MR/TR, afib RVR.Impella placed 11/30. Cortrak to be placed 12/3.  I met today at Cody Arellano bedside along with wife. They confirm plan for family meeting tomorrow 12/11 1000 am with Fredia Sorrow at bedside and other children over conference call line. I have confirmed with my colleague Sharyn Lull that she will navigate this meeting. RN and front desk notified.   Conference call: [phone] Code: [id number]#  Cody Arellano is sitting up in recliner. Impella attempts at weaning with goal of being able to wean off next week if he tolerates. He continues to be motivated and trying to be active and eat as much as possible.   All questions/concerns addressed. Emotional support provided.   Exam: Alert, cachectic, fatigued. No distress. Up in recliner. Cortrak in place. Impella in place. HR 50s. Abd flat. Moves all extremities.   Plan: - Family goals of care meeting tomorrow 05/13/20 1000 am with Tacey Ruiz, NP.   15 min  Vinie Sill, NP Palliative Medicine Team Pager [phone] (Please see [email or web address] for schedule) Team Phone [phone]    Greater than 50%  of this time was spent counseling and coordinating care related to the above assessment and plan

## 2020-05-13 NOTE — Plan of Care (Signed)

## 2020-05-13 NOTE — Progress Notes (Signed)
Patient ID: Cody Arellano, male   DOB: 03-20-1946, 74 y.o.   MRN: [id number]     Advanced Heart Failure Rounding Note  PCP-Cardiologist: Cody Moores, MD   Subjective:    Impella 5.5 placed on 11/30 with cardiogenic shock.  Heparin systemically and in purge.  MAP < 65 overnight so norepinephrine 6 started.    VT 12/7 early am had DCCV, converted back to afib.  Later went into a junctional rhythm and amiodarone stopped, then converted to NSR.     CXR with possible PNA right lung.  He has completed course of vancomycin/cefepime, now afebrile.   No complaints this morning, no dyspnea.  Walked down hall yesterday.  Eating about half of meals and has TFs running via Cortrack at night.  2 BMs yesterday.    Platelets back to normal range.  HIT negative. He got 1u RBCs 12/4 and again 12/6. hgb 9.   TEE-guided DCCV on 12/3.  Echo with mildly dilated LV, EF 20%, mildly dilated RV with moderately decreased systolic function, moderate MR.   - Echo: EF 20-25% with septal akinesis, mid to apical inferior akinesis, apical anterior and apex akinesis.  Moderately dilated and dysfunctional RV.  Moderate TR.  Severe MR.  Bicuspid aortic valve with no stenosis, mild aortic insufficiency.  - LHC:  1. Dominant LCx, no significant disease in this vessel.  2. 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  The LIMA-LAD is also present.  3. The SVG-ramus has been occluded.  There is severe and diffuse in-stent restenosis in the proximal ramus.   Impella 5.5 P4 Flow 2.8 L, no alarms LDH 231 -> 251 -> 236 -> 212  [place] numbers: CVP 3 PA 34/11 CI 2.9 Co-ox 60%  Objective:   Weight Range: 68.8 kg Body mass index is 20.57 kg/m.   Vital Signs:   Temp:  [97.6 F (36.4 C)-99.5 F (37.5 C)] 98.96 F (37.2 C) (12/10 0800) Pulse Rate:  [53-62] 56 (12/10 0800) Resp:  [13-25] 19 (12/10 0800) SpO2:  [98 %-100 %] 100 % (12/10 0800) Arterial Line BP: (90-140)/(42-65) 105/50 (12/10  0800) Weight:  [68.8 kg] 68.8 kg (12/10 0500) Last BM Date: 05/12/20  Weight change: Filed Weights   05/10/20 0439 05/12/20 0415 05/13/20 0500  Weight: 69.5 kg 70.8 kg 68.8 kg    Intake/Output:   Intake/Output Summary (Last 24 hours) at 05/13/2020 0818 Last data filed at 05/13/2020 0800 Gross per 24 hour  Intake 2024.12 ml  Output 1975 ml  Net 49.12 ml      Physical Exam    General: NAD, frail Neck: No JVD, no thyromegaly or thyroid nodule.  Lungs: Clear to auscultation bilaterally with normal respiratory effort. CV: Nondisplaced PMI.  Heart regular S1/S2, no S3/S4, 1/6 HSM apex.  No peripheral edema.   Abdomen: Soft, nontender, no hepatosplenomegaly, no distention.  Skin: Intact without lesions or rashes.  Neurologic: Alert and oriented x 3.  Psych: Normal affect. Extremities: No clubbing or cyanosis.  HEENT: Normal.    Telemetry    Sinus 50s-60s Personally reviewed   Labs    CBC Recent Labs    05/12/20 0441 05/13/20 0415  WBC 6.4 7.5  HGB 8.9* 9.0*  HCT 28.7* 28.0*  MCV 86.2 84.1  PLT 191 878   Basic Metabolic Panel Recent Labs    05/10/20 2011 05/11/20 0356 05/12/20 0441 05/13/20 0415  NA  --    < > 134* 132*  K  --    < >  5.0 4.6  CL  --    < > 105 102  CO2  --    < > 22 22  GLUCOSE  --    < > 108* 117*  BUN  --    < > 25* 21  CREATININE  --    < > 0.78 0.78  CALCIUM  --    < > 8.2* 8.1*  MG  --   --  1.9  --   PHOS 1.9*  --  2.2*  --    < > = values in this interval not displayed.   Liver Function Tests No results for input(s): AST, ALT, ALKPHOS, BILITOT, PROT, ALBUMIN in the last 72 hours. No results for input(s): LIPASE, AMYLASE in the last 72 hours. Cardiac Enzymes No results for input(s): CKTOTAL, CKMB, CKMBINDEX, TROPONINI in the last 72 hours.  BNP: BNP (last 3 results) Recent Labs    04/06/20 2134 04/26/20 1319 04/30/20 0248  BNP 1,677.6* 1,824.4* 1,685.5*    ProBNP (last 3 results) No results for input(s): PROBNP in  the last 8760 hours.   D-Dimer No results for input(s): DDIMER in the last 72 hours. Hemoglobin A1C No results for input(s): HGBA1C in the last 72 hours. Fasting Lipid Panel No results for input(s): CHOL, HDL, LDLCALC, TRIG, CHOLHDL, LDLDIRECT in the last 72 hours. Thyroid Function Tests No results for input(s): TSH, T4TOTAL, T3FREE, THYROIDAB in the last 72 hours.  Invalid input(s): FREET3  Other results:   Imaging    ECHOCARDIOGRAM LIMITED  Result Date: 05/12/2020    ECHOCARDIOGRAM LIMITED REPORT   [name] Date of Exam: 05/12/2020 Medical Rec #:  [id number]    Height:       72.0 in Accession #:    [id number]   Weight:       156.1 lb Date of Birth:  06-Jul-1945    BSA:          1.918 m Patient Age:    39 years     BP:           00/00 mmHg Patient Gender: M            HR:           53 bpm. Exam Location:  Inpatient Procedure: Limited Echo Indications:    CHF  History:        Patient has prior history of Echocardiogram examinations, most                 recent 05/10/2020. CHF and Cardiomyopathy, Aortic Valve Disease                 and bicuspid AoV; Arrythmias:Atrial Fibrillation. Impella.  Sonographer:    Cody Arellano Referring Phys: [place]  1. Left ventricular ejection fraction, by estimation, is 20 to 25%. The left ventricle has severely decreased function. The left ventricle demonstrates global hypokinesis with relative preservation of the lateral wall. Impella catheter is in reasonable position at 4.6 cm.  2. Right ventricular systolic function is moderately reduced. The right ventricular size is moderately enlarged.  3. Left atrial size was mildly dilated.  4. Right atrial size was mildly dilated. FINDINGS  Left Ventricle: Left ventricular ejection fraction, by estimation, is 20 to 25%. The left ventricle has severely decreased function. The left ventricle demonstrates global hypokinesis. The left ventricular internal cavity size was normal  in size. There is no left ventricular hypertrophy. Right Ventricle: The right ventricular  size is moderately enlarged. Right ventricular systolic function is moderately reduced. Left Atrium: Left atrial size was mildly dilated. Right Atrium: Right atrial size was mildly dilated. Cody Champagne MD Electronically signed by Cody Champagne MD Signature Date/Time: 05/12/2020/1:51:48 PM    Final      Medications:     Scheduled Medications: . sodium chloride   Intravenous Once  . amiodarone  200 mg Oral BID  . aspirin  81 mg Oral Daily  . chlorhexidine  15 mL Mouth Rinse BID  . Chlorhexidine Gluconate Cloth  6 each Topical Daily  . digoxin  0.125 mg Oral Daily  . docusate  100 mg Oral BID  . docusate sodium  100 mg Oral BID  . dronabinol  2.5 mg Oral BID AC  . feeding supplement  237 mL Oral BID BM  . feeding supplement (PROSource TF)  45 mL Per Tube QID  . mouth rinse  15 mL Mouth Rinse q12n4p  . midodrine  2.5 mg Oral TID WC  . multivitamin with minerals  1 tablet Oral Daily  . rosuvastatin  20 mg Oral Daily  . sodium chloride flush  10-40 mL Intracatheter Q12H  . sodium chloride flush  3 mL Intravenous Q12H  . spironolactone  12.5 mg Oral Daily    Infusions: . sodium chloride Stopped (05/06/20 0858)  . sodium chloride    . sodium chloride Stopped (05/12/20 0447)  . sodium chloride Stopped (05/12/20 0447)  . sodium chloride Stopped (05/12/20 0445)  . dextrose Stopped (05/06/20 2218)  . feeding supplement (VITAL 1.5 CAL) 75 mL/hr at 05/13/20 0700  . heparin 350 Units/hr (05/13/20 0700)  . impella catheter heparin 50 unit/mL in dextrose 5%    . lactated ringers Stopped (05/11/20 0800)    PRN Medications: Place/Maintain arterial line **AND** sodium chloride, sodium chloride, acetaminophen (TYLENOL) oral liquid 160 mg/5 mL, bisacodyl, fentaNYL (SUBLIMAZE) injection, lidocaine, ondansetron (ZOFRAN) IV, oxyCODONE, polyethylene glycol, Resource ThickenUp Clear, sodium chloride flush,  sodium chloride flush, white petrolatum   Assessment/Plan   1. 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  Echo this admission looks worse than 10/21 with EF 20-25% with septal akinesis, mid to apical inferior akinesis, apical anterior and apex akinesis, moderately dilated and dysfunctional RV, moderate TR, severe MR, bicuspid aortic valve with no stenosis, mild aortic insufficiency. Progressive/end-stage CHF worsened by onset of atrial fibrillation. Cardiogenic shock 11/30 with co-ox down to 30%, Impella 5.5 placed to allow time to stabilize and consider further steps.  Currently with CI 2.9 and co-ox 60% on P4 Impella with no alarms. Waveforms look good. On heparin gtt. CVP low at 3. LDH stable, no evidence for significant hemolysis.  Back on low dose NE overnight with soft BP.  - Will stop NE today, ok for SBP 95 and above.  - Start midodrine 2.5 mg tid and stop losartan.  - No Lasix for now.  - Decrease Impella speed to P3 today.  Watch over weekend, add milrinone if cardiac output drops. Would like to try to remove Impella early next week. Impella position stable on echo yesterday.    - Continue digoxin this morning.  - Continue spironolactone 12.5 daily.  - He has end-stage biventricular HF with cardiogenic shock physiology in the setting of AF/RVR and severe MR and advanced ischemic CM.  He now s/p Impella 5.5 to support DC-CV and medical optimization.  Now off inotropes. Maintaining NSR. He is not a transplant candidate.  Barriers to LVAD include nutritional status/deconditioning and RV.  I think RV would be acceptable.  He is getting tube feeds and Ensure.  We discussed in [place], not LVAD candidate at this point with malnutrition/deconditioning.  Will try to wean Impella potentially to milrinone to allow time to improve nutritional status and mobility (hopefully to CIR).  - Palliative Care has seen and continue to discuss [place] 2. CAD: Prior history of MI with LAD  and ramus PCI. Admitted May 2021 with anterior STEMI. LHC with occluded ostial LAD (in-stent), 90% ostial ramus, 60-70% proximal LCx, nondominant RCA. PTCA to LAD and ramus to restore flow, then CABG with LIMA-LAD and SVG-ramus.  No chest pain, doubt ACS.  Fall in EF from 10/21 to 11/21, but he did not present with ACS.  [place] 12/1 showed patient LIMA-LAD, patent native LAD, patent dominant LCx.  The SVG-ramus is occluded with severe diffuse in-stent restenosis in proximal ramus.  Ramus not intervened upon as this would be a complex intervention and unlikely to markedly improve EF. No chest pain.  - Continue ASA 81 and statin.    3. AKI: Creatinine now trending down with improved cardiac output. Renal function normalized.   4. Right pleural effusion: S/p thoracentesis, transudative (CHF).  5. Atrial fibrillation/flutter with RVR: DCCV on 12/3, went back to NSR 12/7. - Continue amiodarone at 200 mg bid.   -  IV heparin.  6. Mitral regurgitation:  TEE 12/3 with moderate central MR.  Doubt MitraClip will help him much with moderate MR and he is likely too advanced.  7. H/o PE: In 10/21.  Keep anticoagulated, on heparin gtt.  8. Bicuspid aortic valve: Mild AI, no AS.  9. SLE: H/o pericarditis.  On Imuran and hydroxychloroquine on. Holding hydroxychloroquine for now with risk for QT prolongation.  10. Pulmonary nodules/emphysema: CCM following, s/p bronch/BAL (no growth).  11. ID: Concern for right-sided PNA, ?aspiration.  Cultures NGTD.  Now afebrile and has completed course of vancomycin/cefepime.   12. Thrombocytopenia: Resolved.  May have been due to critical illness, low grade hemolysis with Impella. LDH ok.  HIT negative.  - Back on heparin gtt.  13. Hypokalemia/hypomag: Supplement as needed.  14. Acute blood loss anemia: No obvious site.  Looks like less swelling at Impella site.  Hgb 9 today.  - Transfuse hgb < 8.  15. VT: Episode early am 12/7, required DCCV.  No recurrence.   CRITICAL  CARE Performed by: Cody Arellano  Total critical care time: 40 minutes  Critical care time was exclusive of separately billable procedures and treating other patients.  Critical care was necessary to treat or prevent imminent or life-threatening deterioration.  Critical care was time spent personally by me (independent of midlevel providers or residents) on the following activities: development of treatment plan with patient and/or surrogate as well as nursing, discussions with consultants, evaluation of patient's response to treatment, examination of patient, obtaining history from patient or surrogate, ordering and performing treatments and interventions, ordering and review of laboratory studies, ordering and review of radiographic studies, pulse oximetry and re-evaluation of patient's condition.   Length of Stay: 17  Cody Champagne, MD  05/13/2020, 8:18 AM  Advanced Heart Failure Team Pager [phone] (M-F; 7a - 4p)  Please contact [facility] for night-coverage after hours (4p -7a ) and weekends on [email or web address]

## 2020-05-13 NOTE — Progress Notes (Signed)
Inpatient Rehab Admissions Coordinator:   We received CIR consult.  Note pt mobilizing 200' min assist yesterday, still with Impella, and plans for goals of care meeting tomorrow with palliative care.  Also note pt's wish for aggressive care with hopes of recovery.  I will follow up with patient at bedside on Monday, pending results of meeting tomorrow, to discuss goals and expectations of a potential CIR stay.    Shann Medal, PT, DPT Admissions Coordinator [phone] 05/13/20  12:14 PM

## 2020-05-13 NOTE — Progress Notes (Addendum)
Nutrition Follow-up  DOCUMENTATION CODES:   Underweight,Severe malnutrition in context of chronic illness  INTERVENTION:   Decrease nocturnal feedings:  -Vital 1.5 @ 60 ml/hr x 14 hours (1800-0800) via Cortrak -45 ml ProSource QID  Provides: 1420 kcals, 101 grams protein, 641 ml free water. Meets 68% of kcal needs and 88% of protein needs   Magic cup TID with meals, each supplement provides 290 kcal and 9 grams of protein  Ensure Enlive po BID, each supplement provides 350 kcal and 20 grams of protein  NUTRITION DIAGNOSIS:   Severe Malnutrition related to chronic illness (CHF) as evidenced by severe muscle depletion,severe fat depletion,percent weight loss.  Ongoing  GOAL:   Patient will meet greater than or equal to 90% of their needs  Addressed via TF  MONITOR:   PO intake,Supplement acceptance,Skin,Weight trends,Labs,I & O's  REASON FOR ASSESSMENT:   Consult Assessment of nutrition requirement/status,Poor PO  ASSESSMENT:   74 y.o. male with medical history significant for lupus, interstitial lung disease, coronary artery disease, chronic systolic CHF, paroxysmal atrial fibrillation presents with shortness of breath. Pt found to have large right-sided pleural effusion and small left-sided pleural effusion. Chest x-ray showed cardiomegaly with pulmonary venous congestion and bilateral interstitial prominence most consistent with interstitial edema.  11/26- s/p Flexible video fiberoptic bronchoscopy and biopsies 11/30- s/p impella 12/7- DCCV, converted back to afib   Pt discussed during ICU rounds and with RN.   Off pressors. Had BM yesterday! Tolerating nocturnal tube feedings at goal. Phosphorus trending up. Meal intake progressing ~50% each meal. Taking 1-2 Ensures daily. Complains of fullness in am after nocturnal feeding. Decrease feedings and assess for improvement in PO intake Monday. Hopefully can wean off tube feeding soon.   Plan to wean off impella next  week. Family meeting tomorrow at 10 am.   Admission weight: 66.7 kg  Current weight: 68.8 kg   UOP: 1975 ml x 24 hrs   Medications: colace, marinol, aldactone  Labs: Na 132 (L) CBG 83-115 Phosphorus 2.2 (L)  Diet Order:   Diet Order            Diet regular Room service appropriate? Yes with Assist; Fluid consistency: Thin  Diet effective now                 EDUCATION NEEDS:   Education needs have been addressed  Skin:  Skin Assessment: Skin Integrity Issues: Skin Integrity Issues:: Incisions Incisions: R chest  Last BM:  12/9  Height:   Ht Readings from Last 1 Encounters:  04/29/20 6' (1.829 m)    Weight:   Wt Readings from Last 1 Encounters:  05/13/20 68.8 kg   BMI:  Body mass index is 20.57 kg/m.  Estimated Nutritional Needs:   Kcal:  2100-2300  Protein:  115-130 grams  Fluid:  > 2 L   Mariana Single RD, LDN Clinical Nutrition Pager listed in [place]

## 2020-05-13 NOTE — Progress Notes (Signed)
  Speech Language Pathology Treatment: Dysphagia  Patient Details Name: Cody Arellano MRN: [id number] DOB: August 22, 1945 Today's Date: 05/13/2020 Time: 7035-0093 SLP Time Calculation (min) (ACUTE ONLY): 8 min  Assessment / Plan / Recommendation Clinical Impression  Pt demonstrates tolerance of meals, observed with mixed consistently cereal and milk with no difficulty. Has generally improved since diet upgrade and reports less coughing each day. He was noted to have one instance of throat clear after sips of juice, but then consumed 3 oz of juice consecutively with a straw with no cough, throat clear or wet vocal quality. Pt to continue diet and is ready for d/c of Cortrak if intake is adequate. Will sign off at this time.   HPI HPI: Pt is a 74 y.o. male with medical history significant for lupus, interstitial lung disease, coronary artery disease, chronic systolic CHF, paroxysmal atrial fibrillation on Eliquis, and recent PE who presented to the emergency department due to shortness of breath that has been ongoing for about 2 weeks associated with nausea and intermittent nonbilious, nonbloody vomiting ~2-3 times per week. Pt had cardiogenic shock and impella placed 11/30; heart cath on 12/1. CXR 11/30: Marked worsening of airspace disease throughout the right chest most worrisome for pneumonia. CXR 12/1: Significant interval clearing of airspace opacity on the right as well as milder clearing on the left. No new opacity evident. CXR 12/2: Stable bibasilar subsegmental atelectasis. ETT 11/30-12/2. CXR 12/4: Airspace opacities bilaterally with right worse than left. Small effusions are noted.      SLP Plan  All goals met       Recommendations  Diet recommendations: Thin liquid;Regular Liquids provided via: Cup;Straw Medication Administration: Whole meds with puree Supervision: Patient able to self feed Compensations: Slow rate;Small sips/bites                Plan: All goals met        GO              Cody [place], MA CCC-SLP  Acute Rehabilitation Services Pager [phone] Office [phone]   Cody Arellano 05/13/2020, 9:40 AM

## 2020-05-14 LAB — COOXEMETRY PANEL
Carboxyhemoglobin: 2.2 % — ABNORMAL HIGH (ref 0.5–1.5)
Methemoglobin: 0.7 % (ref 0.0–1.5)
O2 Saturation: 58.1 %
Total hemoglobin: 9.1 g/dL — ABNORMAL LOW (ref 12.0–16.0)

## 2020-05-14 LAB — URINALYSIS, ROUTINE W REFLEX MICROSCOPIC
Bilirubin Urine: NEGATIVE
Glucose, UA: NEGATIVE mg/dL
Hgb urine dipstick: NEGATIVE
Ketones, ur: NEGATIVE mg/dL
Leukocytes,Ua: NEGATIVE
Nitrite: NEGATIVE
Protein, ur: NEGATIVE mg/dL
Specific Gravity, Urine: 1.008 (ref 1.005–1.030)
pH: 7 (ref 5.0–8.0)

## 2020-05-14 LAB — CBC
HCT: 27.8 % — ABNORMAL LOW (ref 39.0–52.0)
Hemoglobin: 9 g/dL — ABNORMAL LOW (ref 13.0–17.0)
MCH: 28.2 pg (ref 26.0–34.0)
MCHC: 32.4 g/dL (ref 30.0–36.0)
MCV: 87.1 fL (ref 80.0–100.0)
Platelets: 234 10*3/uL (ref 150–400)
RBC: 3.19 MIL/uL — ABNORMAL LOW (ref 4.22–5.81)
RDW: 21.3 % — ABNORMAL HIGH (ref 11.5–15.5)
WBC: 8.3 10*3/uL (ref 4.0–10.5)
nRBC: 0.5 % — ABNORMAL HIGH (ref 0.0–0.2)

## 2020-05-14 LAB — LACTATE DEHYDROGENASE: LDH: 247 U/L — ABNORMAL HIGH (ref 98–192)

## 2020-05-14 LAB — BASIC METABOLIC PANEL
Anion gap: 5 (ref 5–15)
BUN: 22 mg/dL (ref 8–23)
CO2: 23 mmol/L (ref 22–32)
Calcium: 8.2 mg/dL — ABNORMAL LOW (ref 8.9–10.3)
Chloride: 105 mmol/L (ref 98–111)
Creatinine, Ser: 0.69 mg/dL (ref 0.61–1.24)
GFR, Estimated: >60 mL/min (ref >60)
Glucose, Bld: 109 mg/dL — ABNORMAL HIGH (ref 70–99)
Potassium: 4.9 mmol/L (ref 3.5–5.1)
Sodium: 133 mmol/L — ABNORMAL LOW (ref 135–145)

## 2020-05-14 LAB — GLUCOSE, CAPILLARY
Glucose-Capillary: 107 mg/dL — ABNORMAL HIGH (ref 70–99)
Glucose-Capillary: 106 mg/dL — ABNORMAL HIGH (ref 70–99)
Glucose-Capillary: 89 mg/dL (ref 70–99)
Glucose-Capillary: 86 mg/dL (ref 70–99)
Glucose-Capillary: 99 mg/dL (ref 70–99)

## 2020-05-14 LAB — HEPARIN LEVEL (UNFRACTIONATED): Heparin Unfractionated: 0.37 IU/mL (ref 0.30–0.70)

## 2020-05-14 LAB — DIGOXIN LEVEL: Digoxin Level: 0.6 ng/mL — ABNORMAL LOW (ref 0.8–2.0)

## 2020-05-14 MED ORDER — FUROSEMIDE 40 MG PO TABS
40.0000 mg | ORAL_TABLET | Freq: Every day | ORAL | Status: DC
  Administered 2020-05-14 – 2020-05-15 (×2): 40 mg via ORAL
  Filled 2020-05-14 (×2): qty 1

## 2020-05-14 NOTE — Progress Notes (Signed)
Patient ID: Cody Arellano, male   DOB: May 04, 1946, 74 y.o.   MRN: [id number]     Advanced Heart Failure Rounding Note  PCP-Cardiologist: Mertie Moores, MD   Subjective:    Impella 5.5 placed on 11/30 with cardiogenic shock.  Heparin systemically and in purge.  MAP < 65 overnight so norepinephrine 6 started.    VT 12/7 early am had DCCV, converted back to afib.  Later went into a junctional rhythm and amiodarone stopped, then converted to NSR.     CXR with possible PNA right lung.  He has completed course of vancomycin/cefepime, now afebrile.   No complaints this morning, no dyspnea.  Walked down hall yesterday.  Eating about half of meals and has TFs running via [place] at night.    Platelets back to normal range.  HIT negative. He got 1u RBCs 12/4 and again 12/6. hgb 9.   TEE-guided DCCV on 12/3.  Echo with mildly dilated LV, EF 20%, mildly dilated RV with moderately decreased systolic function, moderate MR.   - Echo: EF 20-25% with septal akinesis, mid to apical inferior akinesis, apical anterior and apex akinesis.  Moderately dilated and dysfunctional RV.  Moderate TR.  Severe MR.  Bicuspid aortic valve with no stenosis, mild aortic insufficiency.  - LHC:  1. Dominant LCx, no significant disease in this vessel.  2. 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  The LIMA-LAD is also present.  3. The SVG-ramus has been occluded.  There is severe and diffuse in-stent restenosis in the proximal ramus.   Impella 5.5 P3 Flow 2.1 L, no alarms LDH 231 -> 251 -> 236 -> 212 -> 247  [place] numbers: CVP 8 PA 55/15 CI 2.9 Co-ox 58%  Objective:   Weight Range: 68.8 kg Body mass index is 20.57 kg/m.   Vital Signs:   Temp:  [98.24 F (36.8 C)-99.5 F (37.5 C)] 98.78 F (37.1 C) (12/11 0800) Pulse Rate:  [52-65] 62 (12/11 0800) Resp:  [15-22] 20 (12/11 0800) SpO2:  [95 %-100 %] 100 % (12/11 0800) Arterial Line BP: (87-130)/(41-63) 111/55 (12/11 0800) Last BM Date:  05/14/20  Weight change: Filed Weights   05/10/20 0439 05/12/20 0415 05/13/20 0500  Weight: 69.5 kg 70.8 kg 68.8 kg    Intake/Output:   Intake/Output Summary (Last 24 hours) at 05/14/2020 0853 Last data filed at 05/14/2020 0800 Gross per 24 hour  Intake 1923.9 ml  Output 2575 ml  Net -651.1 ml      Physical Exam    General: NAD, frail Neck: No JVD, no thyromegaly or thyroid nodule.  Lungs: Clear to auscultation bilaterally with normal respiratory effort. CV: Lateral PMI.  Heart regular S1/S2, no S3/S4, 1/6 HSM apex.  No peripheral edema.   Abdomen: Soft, nontender, no hepatosplenomegaly, no distention.  Skin: Intact without lesions or rashes.  Neurologic: Alert and oriented x 3.  Psych: Normal affect. Extremities: No clubbing or cyanosis.  HEENT: Normal.    Telemetry    Sinus 50s-60s Personally reviewed   Labs    CBC Recent Labs    05/13/20 0415 05/14/20 0510  WBC 7.5 8.3  HGB 9.0* 9.0*  HCT 28.0* 27.8*  MCV 84.1 87.1  PLT 224 683   Basic Metabolic Panel Recent Labs    05/12/20 0441 05/13/20 0415 05/13/20 1432 05/14/20 0510  NA 134* 132*  --  133*  K 5.0 4.6  --  4.9  CL 105 102  --  105  CO2 22 22  --  23  GLUCOSE 108* 117*  --  109*  BUN 25* 21  --  22  CREATININE 0.78 0.78  --  0.69  CALCIUM 8.2* 8.1*  --  8.2*  MG 1.9  --   --   --   PHOS 2.2*  --  3.2  --    Liver Function Tests No results for input(s): AST, ALT, ALKPHOS, BILITOT, PROT, ALBUMIN in the last 72 hours. No results for input(s): LIPASE, AMYLASE in the last 72 hours. Cardiac Enzymes No results for input(s): CKTOTAL, CKMB, CKMBINDEX, TROPONINI in the last 72 hours.  BNP: BNP (last 3 results) Recent Labs    04/06/20 2134 04/26/20 1319 04/30/20 0248  BNP 1,677.6* 1,824.4* 1,685.5*    ProBNP (last 3 results) No results for input(s): PROBNP in the last 8760 hours.   D-Dimer No results for input(s): DDIMER in the last 72 hours. Hemoglobin A1C No results for  input(s): HGBA1C in the last 72 hours. Fasting Lipid Panel No results for input(s): CHOL, HDL, LDLCALC, TRIG, CHOLHDL, LDLDIRECT in the last 72 hours. Thyroid Function Tests No results for input(s): TSH, T4TOTAL, T3FREE, THYROIDAB in the last 72 hours.  Invalid input(s): FREET3  Other results:   Imaging    No results found.   Medications:     Scheduled Medications:  sodium chloride   Intravenous Once   amiodarone  200 mg Oral BID   aspirin  81 mg Oral Daily   chlorhexidine  15 mL Mouth Rinse BID   Chlorhexidine Gluconate Cloth  6 each Topical Daily   digoxin  0.125 mg Oral Daily   docusate  100 mg Oral BID   docusate sodium  100 mg Oral BID   dronabinol  2.5 mg Oral BID AC   feeding supplement  237 mL Oral BID BM   feeding supplement (PROSource TF)  45 mL Per Tube QID   furosemide  40 mg Oral Daily   mouth rinse  15 mL Mouth Rinse q12n4p   midodrine  2.5 mg Oral TID WC   multivitamin with minerals  1 tablet Oral Daily   rosuvastatin  20 mg Oral Daily   sodium chloride flush  10-40 mL Intracatheter Q12H   sodium chloride flush  3 mL Intravenous Q12H   spironolactone  12.5 mg Oral Daily    Infusions:  sodium chloride Stopped (05/06/20 0858)   sodium chloride     sodium chloride Stopped (05/12/20 0447)   sodium chloride Stopped (05/12/20 0447)   sodium chloride Stopped (05/12/20 0445)   dextrose Stopped (05/06/20 2218)   feeding supplement (VITAL 1.5 CAL) Stopped (05/14/20 0800)   heparin 350 Units/hr (05/14/20 0800)   impella catheter heparin 50 unit/mL in dextrose 5%     lactated ringers Stopped (05/11/20 0800)    PRN Medications: Place/Maintain arterial line **AND** sodium chloride, sodium chloride, acetaminophen (TYLENOL) oral liquid 160 mg/5 mL, bisacodyl, fentaNYL (SUBLIMAZE) injection, lidocaine, ondansetron (ZOFRAN) IV, oxyCODONE, polyethylene glycol, Resource ThickenUp Clear, sodium chloride flush, sodium chloride flush, white  petrolatum   Assessment/Plan   1. 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  Echo this admission looks worse than 10/21 with EF 20-25% with septal akinesis, mid to apical inferior akinesis, apical anterior and apex akinesis, moderately dilated and dysfunctional RV, moderate TR, severe MR, bicuspid aortic valve with no stenosis, mild aortic insufficiency. Progressive/end-stage CHF worsened by onset of atrial fibrillation. Cardiogenic shock 11/30 with co-ox down to 30%, Impella 5.5 placed to allow time to stabilize and consider  further steps.  Currently with CI 2.9 and co-ox 58% on P3 Impella with no alarms. Waveforms look good. On heparin gtt. PA pressure and CVP higher today, CVP 8. LDH stable, no evidence for significant hemolysis.  BP stable but on midodrine 2.5 tid.   - Ok for SBP 95 and above.  - Continue midodrine 2.5 tid.   - Start Lasix 40 mg po daily with rising filling pressures.  - Continue Impella P3.  Watch over weekend, add milrinone if cardiac output drops. Would like to remove Impella Monday.   - Continue digoxin  - Continue spironolactone 12.5 daily.  - He has end-stage biventricular HF with cardiogenic shock physiology in the setting of AF/RVR and severe MR and advanced ischemic CM.  He now s/p Impella 5.5 to support DC-CV and medical optimization.  Now off inotropes. Maintaining NSR. He is not a transplant candidate.  Barriers to LVAD include nutritional status/deconditioning and RV.  I think RV would be acceptable.  He is getting tube feeds and Ensure.  We discussed in [place], not LVAD candidate at this point with malnutrition/deconditioning.  Will try to wean Impella potentially to milrinone to allow time to improve nutritional status and mobility (hopefully to CIR).  - Palliative Care has seen and continue to discuss [place] [facility]. CAD: Prior history of MI with LAD and ramus PCI. Admitted May 2021 with anterior STEMI. LHC with occluded ostial LAD (in-stent),  90% ostial ramus, 60-70% proximal LCx, nondominant RCA. PTCA to LAD and ramus to restore flow, then CABG with LIMA-LAD and SVG-ramus.  No chest pain, doubt ACS.  Fall in EF from 10/21 to 11/21, but he did not present with ACS.  [place] 12/1 showed patient LIMA-LAD, patent native LAD, patent dominant LCx.  The SVG-ramus is occluded with severe diffuse in-stent restenosis in proximal ramus.  Ramus not intervened upon as this would be a complex intervention and unlikely to markedly improve EF. No chest pain.  - Continue ASA 81 and statin.    3. AKI: Creatinine now trending down with improved cardiac output. Renal function normalized.   4. Right pleural effusion: S/p thoracentesis, transudative (CHF).  5. Atrial fibrillation/flutter with RVR: DCCV on 12/3, went back to NSR 12/7. Remains in NSR.  - Continue amiodarone at 200 mg bid.   -  IV heparin.  6. Mitral regurgitation:  TEE 12/3 with moderate central MR.  Doubt MitraClip will help him much with moderate MR and he is likely too advanced.  7. H/o PE: In 10/21.  Keep anticoagulated, on heparin gtt.  8. Bicuspid aortic valve: Mild AI, no AS.  9. SLE: H/o pericarditis.  On Imuran and hydroxychloroquine on. Holding hydroxychloroquine for now with risk for QT prolongation.  - Will need to restart SLE meds eventually.  10. Pulmonary nodules/emphysema: CCM following, s/p bronch/BAL (no growth).  11. ID: Concern for right-sided PNA, ?aspiration.  Cultures NGTD.  Now afebrile and has completed course of vancomycin/cefepime.   12. Thrombocytopenia: Resolved.  May have been due to critical illness, low grade hemolysis with Impella. LDH ok.  HIT negative.  - Back on heparin gtt.  13. Hypokalemia/hypomag: Supplement as needed.  14. Acute blood loss anemia: No obvious site.  Looks like less swelling at Impella site.  Hgb 9 today.  - Transfuse hgb < 8.  15. VT: Episode early am 12/7, required DCCV.  No recurrence.   CRITICAL CARE Performed by: Loralie Champagne  Total critical care time: 40 minutes  Critical care time was  exclusive of separately billable procedures and treating other patients.  Critical care was necessary to treat or prevent imminent or life-threatening deterioration.  Critical care was time spent personally by me (independent of midlevel providers or residents) on the following activities: development of treatment plan with patient and/or surrogate as well as nursing, discussions with consultants, evaluation of patient's response to treatment, examination of patient, obtaining history from patient or surrogate, ordering and performing treatments and interventions, ordering and review of laboratory studies, ordering and review of radiographic studies, pulse oximetry and re-evaluation of patient's condition.   Length of Stay: 32  Loralie Champagne, MD  05/14/2020, 8:53 AM  Advanced Heart Failure Team Pager [phone] (M-F; 7a - 4p)  Please contact [facility] for night-coverage after hours (4p -7a ) and weekends on [email or web address]

## 2020-05-14 NOTE — Plan of Care (Signed)

## 2020-05-14 NOTE — Progress Notes (Signed)
[place] for Heparin  Indication: Impella and hx PE  No Known Allergies  Patient Measurements: Height: 6' (182.9 cm) Weight: 68.8 kg (151 lb 10.8 oz) IBW/kg (Calculated) : 77.6 Heparin Dosing Weight: 60kg  Vital Signs: Temp: 98.96 F (37.2 C) (12/11 1305) Pulse Rate: 58 (12/11 1305)  Labs: Recent Labs    05/11/20 1907 05/11/20 1907 05/12/20 0441 05/13/20 0415 05/14/20 0510  HGB  --    < > 8.9* 9.0* 9.0*  HCT  --   --  28.7* 28.0* 27.8*  PLT  --   --  191 224 234  APTT 60*  --  64*  --   --   HEPARINUNFRC 0.53  --  0.57 0.40 0.37  CREATININE  --   --  0.78 0.78 0.69   < > = values in this interval not displayed.    Estimated Creatinine Clearance: 78.8 mL/min (by C-G formula based on SCr of 0.69 mg/dL).   Assessment: 74 yo Male with hx recent PE (03/2020) on apixaban PTA admitted with ADHF now s/p Impella placement, for heparin.   RN reported issues with Impella purge flow rate 12/5  but those have resolved.  Purge flow back to 11.6 ml/hr = 580 uts/hr and systemic heparin 350 uts/hr, total heparin 930 uts/hr.  Heparin level 0.37 at goal.  Hgb low but stable 9, plt 247 improved. LDH 200s (stable). No bleeding issues noted.   Goal of Therapy:  HL 0.3-0.5 Monitor platelets by anticoagulation protocol: Yes   Plan:  Continue systemic IV heparin at 350 units/hr with heparin purge Daily CBC and heparin levels  Erin Hearing PharmD., BCPS Clinical Pharmacist 05/14/2020 2:47 PM  [facility] pharmacy phone numbers are listed on [email or web address]

## 2020-05-14 NOTE — Progress Notes (Signed)
Palliative Medicine Inpatient Follow Up Note   Reason for Consultation: Establishing goals of care  HPI/Patient Profile: 74 y.o. male  with past medical history of SLE, interstitial lung disease, coronary artery disease, chronic systolic heart failure, CABG 10/2019, paroxysmal atrial fibrillation on Eliquis, recent PE admitted on 04/26/2020 with shortness of breath related to cardiogenic shock with CHF EF 20-25%, severe MR/TR, afib RVR. Impella placed 11/30. Cortrak to be placed 12/3.   Palliative care was asked to get involved to aid in goals of care conversations.  Today's Discussion (05/14/2020):  Family Meeting   Participants: Cody Arellano, Cody Arellano, Cody Arellano, Cody Arellano, and Cody Arellano  Providers: Cody Ruiz NP  Conversation: I met with Cody Arellano his wife Cody Arellano and their daughter Cody Arellano at bedside.  We were joined on the conference line by his 4 other children.  I reintroduced the topic of palliative care. I shared that we are specialized medical care for people living with serious illness. We focus on providing relief from the symptoms and stress of a serious illness. The goal is to improve quality of life for both the patient and the family.  We discussed Cody Arellano present health state being guarded given his end-stage heart failure.  We talked about the present interventions inclusive of the Impella that are in place to stabilize his heart.  We reviewed that he is ideally going to be taken off the Impella on Monday.  We reviewed his paroxysmal atrial fibrillation and how this worsens and already weakened heart.  We reviewed in detail the medications that he is on and the medications that he may need to be initiated on moving forward.  His sons Cody Arellano and Cody Arellano asked a variety of very thoughtful questions pertaining to medications as well as devices.  We discussed that the goal at this point in time as per Cody Arellano is to improve his health state to the point  whereby he could do some of the things that he used to enjoy.  He shares that he quite likes to golf and to do yard work.  He would ideally like to get back to doing some or all of those things.   We talked about Cody Arellano's strength and how debilitated and deconditioned he is at this point in time.  We discussed that for him to even be a candidate for additional therapies he would need to get quite a lot stronger.  The hope would be for him to go to CIR where he would get aggressive physical therapy.  Adjunctively he needs to work on his nutritional state which right now is supplemented by nighttime core track feedings.  We further discussed the very real idea that if he is still not deemed a candidate for LVAD after his best attempts at strengthening himself and obtaining appropriate nutrition that we would have to travel down the road of hospice care.  I described hospice as a service for patients for have a life expectancy of < 6 months. It preserves dignity and quality at the end phases of life. The focus changes from curative to symptom relief.   In regard to advance care planning I recommended that Cody Arellano take the opportunity to speak with his wife and children about who he would want to a point as is his healthcare agent and to complete his living will.  Patient's bedside nurse Cody Arellano was able to speak about LVAD's and how there is a level of thoughtfulness that needs to be pursued prior  to and while getting one.   All questions answered to family satisfaction.  Ongoing conversations moving forward depending upon [name] progression or decline.  Objective Assessment: Vital Signs Vitals:   05/14/20 1000 05/14/20 1100  BP:    Pulse: (!) 54 (!) 54  Resp: (!) 21 (!) 21  Temp:  99.14 F (37.3 C)  SpO2: 100% 99%    Intake/Output Summary (Last 24 hours) at 05/14/2020 1200 Last data filed at 05/14/2020 1100 Gross per 24 hour  Intake 1558.86 ml  Output 2575 ml  Net -1016.14 ml   Last Weight   Most recent update: 05/13/2020  6:35 AM   Weight  68.8 kg (151 lb 10.8 oz)           Physical Exam Vitals and nursing note reviewed.  Constitutional:      General: He is not in acute distress.    Appearance: He is cachectic. He is ill-appearing. Coretrack in place Cardiovascular:     Rate and Rhythm: Impella in place  Pulmonary:     Effort: No tachypnea, accessory muscle usage or respiratory distress.  Abdominal:     General: Abdomen is flat.  Neurological:     Mental Status: He is alert and oriented to person, place, and time.   SUMMARY OF RECOMMENDATIONS   - Full code - Desires full aggressive care at this time - Ongoing advance care planning conversations - Patient hopes to go to CIR to optimize strength - Ongoing nutritional support - Ongoing conversations about goals now and moving forward  Time Spent: 60 Greater than 50% of the time was spent in counseling and coordination of care ______________________________________________________________________________________ [place] Team Team Cell Phone: [phone] Please utilize secure chat with additional questions, if there is no response within 30 minutes please call the above phone number  Palliative Medicine Team providers are available by phone from 7am to 7pm daily and can be reached through the team cell phone.  Should this patient require assistance outside of these hours, please call the patient's attending physician.

## 2020-05-15 DIAGNOSIS — R570 Cardiogenic shock: Secondary | ICD-10-CM

## 2020-05-15 LAB — CBC
HCT: 26.7 % — ABNORMAL LOW (ref 39.0–52.0)
Hemoglobin: 8.5 g/dL — ABNORMAL LOW (ref 13.0–17.0)
MCH: 27.9 pg (ref 26.0–34.0)
MCHC: 31.8 g/dL (ref 30.0–36.0)
MCV: 87.5 fL (ref 80.0–100.0)
Platelets: 238 10*3/uL (ref 150–400)
RBC: 3.05 MIL/uL — ABNORMAL LOW (ref 4.22–5.81)
RDW: 21.8 % — ABNORMAL HIGH (ref 11.5–15.5)
WBC: 8 10*3/uL (ref 4.0–10.5)
nRBC: 0 % (ref 0.0–0.2)

## 2020-05-15 LAB — GLUCOSE, CAPILLARY
Glucose-Capillary: 100 mg/dL — ABNORMAL HIGH (ref 70–99)
Glucose-Capillary: 101 mg/dL — ABNORMAL HIGH (ref 70–99)
Glucose-Capillary: 107 mg/dL — ABNORMAL HIGH (ref 70–99)
Glucose-Capillary: 108 mg/dL — ABNORMAL HIGH (ref 70–99)
Glucose-Capillary: 128 mg/dL — ABNORMAL HIGH (ref 70–99)
Glucose-Capillary: 68 mg/dL — ABNORMAL LOW (ref 70–99)

## 2020-05-15 LAB — BASIC METABOLIC PANEL
Anion gap: 8 (ref 5–15)
BUN: 20 mg/dL (ref 8–23)
CO2: 23 mmol/L (ref 22–32)
Calcium: 8.2 mg/dL — ABNORMAL LOW (ref 8.9–10.3)
Chloride: 102 mmol/L (ref 98–111)
Creatinine, Ser: 0.74 mg/dL (ref 0.61–1.24)
GFR, Estimated: >60 mL/min (ref >60)
Glucose, Bld: 143 mg/dL — ABNORMAL HIGH (ref 70–99)
Potassium: 4.3 mmol/L (ref 3.5–5.1)
Sodium: 133 mmol/L — ABNORMAL LOW (ref 135–145)

## 2020-05-15 LAB — COOXEMETRY PANEL
Carboxyhemoglobin: 2.1 % — ABNORMAL HIGH (ref 0.5–1.5)
Methemoglobin: 0.8 % (ref 0.0–1.5)
O2 Saturation: 56.1 %
Total hemoglobin: 8.9 g/dL — ABNORMAL LOW (ref 12.0–16.0)

## 2020-05-15 LAB — URINE CULTURE: Culture: NO GROWTH

## 2020-05-15 LAB — HEPARIN LEVEL (UNFRACTIONATED): Heparin Unfractionated: 0.33 IU/mL (ref 0.30–0.70)

## 2020-05-15 LAB — LACTATE DEHYDROGENASE: LDH: 225 U/L — ABNORMAL HIGH (ref 98–192)

## 2020-05-15 MED ORDER — AZATHIOPRINE 50 MG PO TABS
100.0000 mg | ORAL_TABLET | Freq: Two times a day (BID) | ORAL | Status: DC
  Administered 2020-05-15 – 2020-05-23 (×16): 100 mg via ORAL
  Filled 2020-05-15 (×19): qty 2

## 2020-05-15 MED ORDER — TAMSULOSIN HCL 0.4 MG PO CAPS
0.4000 mg | ORAL_CAPSULE | Freq: Every day | ORAL | Status: DC
  Administered 2020-05-15 – 2020-05-23 (×8): 0.4 mg via ORAL
  Filled 2020-05-15 (×8): qty 1

## 2020-05-15 MED ORDER — PHENAZOPYRIDINE HCL 100 MG PO TABS
100.0000 mg | ORAL_TABLET | Freq: Three times a day (TID) | ORAL | Status: AC
  Administered 2020-05-15 – 2020-05-17 (×4): 100 mg via ORAL
  Filled 2020-05-15 (×6): qty 1

## 2020-05-15 MED ORDER — HYDROXYCHLOROQUINE SULFATE 200 MG PO TABS
400.0000 mg | ORAL_TABLET | Freq: Every day | ORAL | Status: DC
  Administered 2020-05-15 – 2020-05-22 (×8): 400 mg via ORAL
  Filled 2020-05-15 (×9): qty 2

## 2020-05-15 NOTE — Progress Notes (Signed)
Impella purge solution and cassette changed per protocol.

## 2020-05-15 NOTE — Progress Notes (Signed)
[place] for Heparin  Indication: Impella and hx PE  No Known Allergies  Patient Measurements: Height: 6' (182.9 cm) Weight: 65.6 kg (144 lb 10 oz) IBW/kg (Calculated) : 77.6 Heparin Dosing Weight: 60kg  Vital Signs: Temp: 98.78 F (37.1 C) (12/12 0800) Temp Source: Core (12/12 0800) Pulse Rate: 53 (12/12 0800)  Labs: Recent Labs    05/13/20 0415 05/14/20 0510 05/15/20 0347 05/15/20 0348  HGB 9.0* 9.0* 8.5*  --   HCT 28.0* 27.8* 26.7*  --   PLT 224 234 238  --   HEPARINUNFRC 0.40 0.37  --  0.33  CREATININE 0.78 0.69  --   --     Estimated Creatinine Clearance: 75.2 mL/min (by C-G formula based on SCr of 0.69 mg/dL).   Assessment: 74 yo Male with hx recent PE (03/2020) on apixaban PTA admitted with ADHF now s/p Impella placement, for heparin.   RN reported issues with Impella purge flow rate 12/5  but those have resolved.  Purge flow back to 11.6 ml/hr = 580 uts/hr and systemic heparin 350 uts/hr, total heparin 930 uts/hr.  Heparin level 0.33 at goal.  Hgb low but stable 8.5, plt 225 improved. LDH 200s (stable). No bleeding issues noted.   Goal of Therapy:  HL 0.3-0.5 Monitor platelets by anticoagulation protocol: Yes   Plan:  Continue systemic IV heparin at 350 units/hr with heparin purge Daily CBC and heparin levels  Erin Hearing PharmD., BCPS Clinical Pharmacist 05/15/2020 8:11 AM  [facility] pharmacy phone numbers are listed on [email or web address]

## 2020-05-15 NOTE — Plan of Care (Signed)
  Problem: Education: Goal: Knowledge of General Education information will improve Description: Including pain rating scale, medication(s)/side effects and non-pharmacologic comfort measures Outcome: Progressing   Problem: Clinical Measurements: Goal: Ability to maintain clinical measurements within normal limits will improve Outcome: Progressing Goal: Will remain free from infection Outcome: Progressing Goal: Diagnostic test results will improve Outcome: Progressing Goal: Respiratory complications will improve Outcome: Progressing Goal: Cardiovascular complication will be avoided Outcome: Progressing   Problem: Activity: Goal: Risk for activity intolerance will decrease Outcome: Progressing   Problem: Nutrition: Goal: Adequate nutrition will be maintained Outcome: Progressing   Problem: Coping: Goal: Level of anxiety will decrease Outcome: Progressing   Problem: Elimination: Goal: Will not experience complications related to bowel motility Outcome: Progressing Goal: Will not experience complications related to urinary retention Outcome: Progressing   Problem: Pain Managment: Goal: General experience of comfort will improve Outcome: Progressing   Problem: Safety: Goal: Ability to remain free from injury will improve Outcome: Progressing   Problem: Skin Integrity: Goal: Risk for impaired skin integrity will decrease Outcome: Progressing   Problem: Education: Goal: Understanding of CV disease, CV risk reduction, and recovery process will improve Outcome: Progressing Goal: Individualized Educational Video(s) Outcome: Progressing   Problem: Activity: Goal: Ability to return to baseline activity level will improve Outcome: Progressing   Problem: Cardiovascular: Goal: Ability to achieve and maintain adequate cardiovascular perfusion will improve Outcome: Progressing Goal: Vascular access site(s) Level 0-1 will be maintained Outcome: Progressing   Pt remains  off Levo. SBP in goal range greater than 95. Impella now down to P3 and pt tolerating well.  Pt is afebrile Breath sounds clear  Pt is OOB to chair and ambulating daily BUE pulses +1, BLE positive doppler Appetite fair, TF continued at HS  BM yesterday, stool softeners continued  Pain with urination, Physicians aware, UA sent (clear), waiting results of UC Pt can make slight adjustments to position, continuing to turn every 2 hours. Small tear noted to        scrotum foam dressing applied Gets regular visits from family, watches TV Pt interested in his care/ motivated to learn and make progress

## 2020-05-15 NOTE — Care Plan (Signed)
BS 68, Pt currently eating dinner will continue to monitor

## 2020-05-15 NOTE — Progress Notes (Signed)
   Palliative Medicine Inpatient Follow Up Note   Reason for Consultation: Establishing goals of care  HPI/Patient Profile: 74 y.o. male  with past medical history of SLE, interstitial lung disease, coronary artery disease, chronic systolic heart failure, CABG 10/2019, paroxysmal atrial fibrillation on Eliquis, recent PE admitted on 04/26/2020 with shortness of breath related to cardiogenic shock with CHF EF 20-25%, severe MR/TR, afib RVR. Impella placed 11/30. Cortrak to be placed 12/3.   Palliative care was asked to get involved to aid in goals of care conversations.  Today's Discussion (05/15/2020): Chart reviewed.  Patient's RN, Consuelo Pandy that patient is successfully going down and Impella settings with the plan for discontinuation tomorrow . I met with Mr. Bridge this morning. He was in good spirits and was sitting up in the chair.  Plan remains for full aggressive care at this point in time.  Palliative care will continue to incrementally follow and offer support.  Objective Assessment: Vital Signs Vitals:   05/15/20 0700 05/15/20 0800  BP:    Pulse: (!) 56 (!) 53  Resp: 17 13  Temp: 98.96 F (37.2 C) 98.78 F (37.1 C)  SpO2: 100% 100%    Intake/Output Summary (Last 24 hours) at 05/15/2020 0846 Last data filed at 05/15/2020 0800 Gross per 24 hour  Intake 1236.35 ml  Output 3550 ml  Net -2313.65 ml   Last Weight  Most recent update: 05/15/2020  6:57 AM   Weight  65.6 kg (144 lb 10 oz)           Physical Exam Vitals and nursing note reviewed.  Constitutional:      General: He is not in acute distress.    Appearance: He is cachectic. He is ill-appearing. Coretrack in place Cardiovascular:     Rate and Rhythm: Impella in place  Pulmonary:     Effort: No tachypnea, accessory muscle usage or respiratory distress.  Abdominal:     General: Abdomen is flat.  Neurological:     Mental Status: He is alert and oriented to person, place, and time.   SUMMARY OF  RECOMMENDATIONS   - Full code - Desires full aggressive care at this time - Ongoing advance care planning conversations -we will request spiritual care to provide advance care planning documents - Patient hopes to go to CIR to optimize strength - Ongoing nutritional support -Palliative care will continue to incrementally follow and offer support as needed  Time Spent: 15 Greater than 50% of the time was spent in counseling and coordination of care ______________________________________________________________________________________ [place] Team Team Cell Phone: [phone] Please utilize secure chat with additional questions, if there is no response within 30 minutes please call the above phone number  Palliative Medicine Team providers are available by phone from 7am to 7pm daily and can be reached through the team cell phone.  Should this patient require assistance outside of these hours, please call the patient's attending physician.

## 2020-05-15 NOTE — Progress Notes (Signed)
Patient ID: Cody Arellano, male   DOB: 1945/09/11, 74 y.o.   MRN: [id number] Patient ID: Cody Arellano, male   DOB: 1945/06/17, 74 y.o.   MRN: [id number]     Advanced Heart Failure Rounding Note  PCP-Cardiologist: Mertie Moores, MD   Subjective:    Impella 5.5 placed on 11/30 with cardiogenic shock.  Heparin systemically and in purge.  MAP stable today. I/Os negative on po Lasix.   VT 12/7 early am had DCCV, converted back to afib.  Later went into a junctional rhythm and amiodarone stopped, then converted to NSR.     CXR with possible PNA right lung.  He has completed course of vancomycin/cefepime, now afebrile.   Having urinary burning, UA negative.  No dyspnea or chest pain.  Walked down hall yesterday.  Eating about half of meals and has TFs running via Cortrack at night.    Platelets back to normal range.  HIT negative. He got 1u RBCs 12/4 and again 12/6. hgb 8.5.   TEE-guided DCCV on 12/3.  Echo with mildly dilated LV, EF 20%, mildly dilated RV with moderately decreased systolic function, moderate MR.   - Echo: EF 20-25% with septal akinesis, mid to apical inferior akinesis, apical anterior and apex akinesis.  Moderately dilated and dysfunctional RV.  Moderate TR.  Severe MR.  Bicuspid aortic valve with no stenosis, mild aortic insufficiency.  - LHC:  1. Dominant LCx, no significant disease in this vessel.  2. 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  The LIMA-LAD is also present.  3. The SVG-ramus has been occluded.  There is severe and diffuse in-stent restenosis in the proximal ramus.   Impella 5.5 P3 Flow 1.9-2 L, no alarms LDH 231 -> 251 -> 236 -> 212 -> 247 -> 225  [place] numbers: CVP 8 PA 28/8 CI 2.9 Co-ox 56%  Objective:   Weight Range: 65.6 kg Body mass index is 19.61 kg/m.   Vital Signs:   Temp:  [98.6 F (37 C)-99.32 F (37.4 C)] 98.78 F (37.1 C) (12/12 0900) Pulse Rate:  [52-69] 52 (12/12 0900) Resp:  [13-29] 19 (12/12 0900) SpO2:  [95  %-100 %] 100 % (12/12 0900) Arterial Line BP: (94-135)/(40-61) 128/49 (12/12 0900) Weight:  [65.6 kg] 65.6 kg (12/12 0500) Last BM Date: 05/15/20  Weight change: Filed Weights   05/12/20 0415 05/13/20 0500 05/15/20 0500  Weight: 70.8 kg 68.8 kg 65.6 kg    Intake/Output:   Intake/Output Summary (Last 24 hours) at 05/15/2020 0973 Last data filed at 05/15/2020 0900 Gross per 24 hour  Intake 1240.35 ml  Output 3550 ml  Net -2309.65 ml      Physical Exam    General: NAD Neck: No JVD, no thyromegaly or thyroid nodule.  Lungs: Clear to auscultation bilaterally with normal respiratory effort. CV: Lateral PMI.  Heart regular S1/S2, no S3/S4, 1/6 HSM apex.  No peripheral edema.  Abdomen: Soft, nontender, no hepatosplenomegaly, no distention.  Skin: Intact without lesions or rashes.  Neurologic: Alert and oriented x 3.  Psych: Normal affect. Extremities: No clubbing or cyanosis.  HEENT: Normal.    Telemetry    Sinus 50s-60s Personally reviewed   Labs    CBC Recent Labs    05/14/20 0510 05/15/20 0347  WBC 8.3 8.0  HGB 9.0* 8.5*  HCT 27.8* 26.7*  MCV 87.1 87.5  PLT 234 532   Basic Metabolic Panel Recent Labs    05/13/20 0415 05/13/20 1432 05/14/20 0510  NA 132*  --  133*  K 4.6  --  4.9  CL 102  --  105  CO2 22  --  23  GLUCOSE 117*  --  109*  BUN 21  --  22  CREATININE 0.78  --  0.69  CALCIUM 8.1*  --  8.2*  PHOS  --  3.2  --    Liver Function Tests No results for input(s): AST, ALT, ALKPHOS, BILITOT, PROT, ALBUMIN in the last 72 hours. No results for input(s): LIPASE, AMYLASE in the last 72 hours. Cardiac Enzymes No results for input(s): CKTOTAL, CKMB, CKMBINDEX, TROPONINI in the last 72 hours.  BNP: BNP (last 3 results) Recent Labs    04/06/20 2134 04/26/20 1319 04/30/20 0248  BNP 1,677.6* 1,824.4* 1,685.5*    ProBNP (last 3 results) No results for input(s): PROBNP in the last 8760 hours.   D-Dimer No results for input(s): DDIMER in the  last 72 hours. Hemoglobin A1C No results for input(s): HGBA1C in the last 72 hours. Fasting Lipid Panel No results for input(s): CHOL, HDL, LDLCALC, TRIG, CHOLHDL, LDLDIRECT in the last 72 hours. Thyroid Function Tests No results for input(s): TSH, T4TOTAL, T3FREE, THYROIDAB in the last 72 hours.  Invalid input(s): FREET3  Other results:   Imaging    No results found.   Medications:     Scheduled Medications:  sodium chloride   Intravenous Once   amiodarone  200 mg Oral BID   aspirin  81 mg Oral Daily   chlorhexidine  15 mL Mouth Rinse BID   Chlorhexidine Gluconate Cloth  6 each Topical Daily   digoxin  0.125 mg Oral Daily   docusate  100 mg Oral BID   docusate sodium  100 mg Oral BID   dronabinol  2.5 mg Oral BID AC   feeding supplement  237 mL Oral BID BM   feeding supplement (PROSource TF)  45 mL Per Tube QID   furosemide  40 mg Oral Daily   mouth rinse  15 mL Mouth Rinse q12n4p   multivitamin with minerals  1 tablet Oral Daily   rosuvastatin  20 mg Oral Daily   sodium chloride flush  10-40 mL Intracatheter Q12H   sodium chloride flush  3 mL Intravenous Q12H   spironolactone  12.5 mg Oral Daily    Infusions:  sodium chloride Stopped (05/06/20 0858)   sodium chloride     sodium chloride Stopped (05/12/20 0447)   sodium chloride Stopped (05/12/20 0447)   sodium chloride Stopped (05/12/20 0445)   dextrose Stopped (05/06/20 2218)   feeding supplement (VITAL 1.5 CAL) Stopped (05/15/20 0801)   heparin 350 Units/hr (05/15/20 0900)   impella catheter heparin 50 unit/mL in dextrose 5%     lactated ringers Stopped (05/11/20 0800)    PRN Medications: Place/Maintain arterial line **AND** sodium chloride, sodium chloride, acetaminophen (TYLENOL) oral liquid 160 mg/5 mL, bisacodyl, fentaNYL (SUBLIMAZE) injection, lidocaine, ondansetron (ZOFRAN) IV, oxyCODONE, polyethylene glycol, Resource ThickenUp Clear, sodium chloride flush, sodium chloride  flush, white petrolatum   Assessment/Plan   1. 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  Echo this admission looks worse than 10/21 with EF 20-25% with septal akinesis, mid to apical inferior akinesis, apical anterior and apex akinesis, moderately dilated and dysfunctional RV, moderate TR, severe MR, bicuspid aortic valve with no stenosis, mild aortic insufficiency. Progressive/end-stage CHF worsened by onset of atrial fibrillation. Cardiogenic shock 11/30 with co-ox down to 30%, Impella 5.5 placed to allow time to stabilize and consider further steps.  Currently with  CI 2.9 and co-ox 56% on P3 Impella with no alarms. Waveforms look good. On heparin gtt. CVP 8, I/Os negative on po Lasix. LDH stable, no evidence for significant hemolysis.     - Stop midodrine today.   - Continue Lasix 40 mg daily.  - Continue Impella P3.  Watch over weekend, add milrinone if cardiac output drops. Would like to remove Impella Monday.   - Continue digoxin  - Continue spironolactone 12.5 daily.  - He has end-stage biventricular HF with cardiogenic shock physiology in the setting of AF/RVR and severe MR and advanced ischemic CM.  He now s/p Impella 5.5 to support DC-CV and medical optimization.  Now off inotropes. Maintaining NSR. He is not a transplant candidate.  Barriers to LVAD include nutritional status/deconditioning and RV.  I think RV would be acceptable.  He is getting tube feeds and Ensure.  We discussed in [place], not LVAD candidate at this point with malnutrition/deconditioning.  Will try to wean Impella potentially to milrinone to allow time to improve nutritional status and mobility (hopefully to CIR).  - Palliative Care has seen and continue to discuss [place] 2. CAD: Prior history of MI with LAD and ramus PCI. Admitted May 2021 with anterior STEMI. LHC with occluded ostial LAD (in-stent), 90% ostial ramus, 60-70% proximal LCx, nondominant RCA. PTCA to LAD and ramus to restore flow,  then CABG with LIMA-LAD and SVG-ramus.  No chest pain, doubt ACS.  Fall in EF from 10/21 to 11/21, but he did not present with ACS.  [place] 12/1 showed patient LIMA-LAD, patent native LAD, patent dominant LCx.  The SVG-ramus is occluded with severe diffuse in-stent restenosis in proximal ramus.  Ramus not intervened upon as this would be a complex intervention and unlikely to markedly improve EF. No chest pain.  - Continue ASA 81 and statin.    3. AKI: Creatinine now trending down with improved cardiac output. Renal function normalized.   4. Right pleural effusion: S/p thoracentesis, transudative (CHF).  5. Atrial fibrillation/flutter with RVR: DCCV on 12/3, went back to NSR 12/7. Remains in NSR.  - Continue amiodarone at 200 mg bid.   -  IV heparin.  6. Mitral regurgitation:  TEE 12/3 with moderate central MR.  Doubt MitraClip will help him much with moderate MR and he is likely too advanced.  7. H/o PE: In 10/21.  Keep anticoagulated, on heparin gtt.  8. Bicuspid aortic valve: Mild AI, no AS.  9. SLE: H/o pericarditis.  On Imuran and hydroxychloroquine on. Holding hydroxychloroquine for now with risk for QT prolongation.  - Restart home SLE regimen now that he is taking po.   10. Pulmonary nodules/emphysema: CCM following, s/p bronch/BAL (no growth).  11. ID: Concern for right-sided PNA, ?aspiration.  Cultures NGTD.  Now afebrile and has completed course of vancomycin/cefepime.   12. Thrombocytopenia: Resolved.  May have been due to critical illness, low grade hemolysis with Impella. LDH ok.  HIT negative.  - Back on heparin gtt.  13. Hypokalemia/hypomag: Supplement as needed.  14. Acute blood loss anemia: No obvious site.  Looks like less swelling at Impella site.  Hgb 9 today.  - Transfuse hgb < 8.  15. VT: Episode early am 12/7, required DCCV.  No recurrence.  16. Dysuria: Negative UA.   - Restart Flomax - Will use urinary tract analgesic.   CRITICAL CARE Performed by: Loralie Champagne  Total critical care time: 35 minutes  Critical care time was exclusive of separately billable procedures and  treating other patients.  Critical care was necessary to treat or prevent imminent or life-threatening deterioration.  Critical care was time spent personally by me (independent of midlevel providers or residents) on the following activities: development of treatment plan with patient and/or surrogate as well as nursing, discussions with consultants, evaluation of patient's response to treatment, examination of patient, obtaining history from patient or surrogate, ordering and performing treatments and interventions, ordering and review of laboratory studies, ordering and review of radiographic studies, pulse oximetry and re-evaluation of patient's condition.   Length of Stay: [place], MD  05/15/2020, 9:27 AM  Advanced Heart Failure Team Pager [phone] (M-F; 7a - 4p)  Please contact [facility] for night-coverage after hours (4p -7a ) and weekends on [email or web address]

## 2020-05-16 ENCOUNTER — Inpatient Hospital Stay (HOSPITAL_COMMUNITY): Payer: No Typology Code available for payment source

## 2020-05-16 ENCOUNTER — Inpatient Hospital Stay (HOSPITAL_COMMUNITY): Payer: No Typology Code available for payment source | Admitting: Certified Registered Nurse Anesthetist

## 2020-05-16 ENCOUNTER — Encounter (HOSPITAL_COMMUNITY)
Admission: EM | Disposition: A | Discharge status: Rehabilitation Facility | Payer: Self-pay | Source: Home / Self Care | Attending: Cardiology

## 2020-05-16 DIAGNOSIS — I255 Ischemic cardiomyopathy: Secondary | ICD-10-CM

## 2020-05-16 HISTORY — PX: REMOVAL OF IMPELLA LEFT VENTRICULAR ASSIST DEVICE: SHX6556

## 2020-05-16 LAB — CBC
HCT: 27.7 % — ABNORMAL LOW (ref 39.0–52.0)
HCT: 29.3 % — ABNORMAL LOW (ref 39.0–52.0)
Hemoglobin: 8.3 g/dL — ABNORMAL LOW (ref 13.0–17.0)
Hemoglobin: 9 g/dL — ABNORMAL LOW (ref 13.0–17.0)
MCH: 26.9 pg (ref 26.0–34.0)
MCH: 27.4 pg (ref 26.0–34.0)
MCHC: 30 g/dL (ref 30.0–36.0)
MCHC: 30.7 g/dL (ref 30.0–36.0)
MCV: 89.9 fL (ref 80.0–100.0)
MCV: 89.3 fL (ref 80.0–100.0)
Platelets: 233 10*3/uL (ref 150–400)
Platelets: 256 10*3/uL (ref 150–400)
RBC: 3.08 MIL/uL — ABNORMAL LOW (ref 4.22–5.81)
RBC: 3.28 MIL/uL — ABNORMAL LOW (ref 4.22–5.81)
RDW: 21.8 % — ABNORMAL HIGH (ref 11.5–15.5)
RDW: 22 % — ABNORMAL HIGH (ref 11.5–15.5)
WBC: 6.8 10*3/uL (ref 4.0–10.5)
WBC: 7.4 10*3/uL (ref 4.0–10.5)
nRBC: 0.3 % — ABNORMAL HIGH (ref 0.0–0.2)
nRBC: 0 % (ref 0.0–0.2)

## 2020-05-16 LAB — BASIC METABOLIC PANEL
Anion gap: 7 (ref 5–15)
Anion gap: 9 (ref 5–15)
BUN: 22 mg/dL (ref 8–23)
BUN: 17 mg/dL (ref 8–23)
CO2: 26 mmol/L (ref 22–32)
CO2: 24 mmol/L (ref 22–32)
Calcium: 8.3 mg/dL — ABNORMAL LOW (ref 8.9–10.3)
Calcium: 8.3 mg/dL — ABNORMAL LOW (ref 8.9–10.3)
Chloride: 100 mmol/L (ref 98–111)
Chloride: 102 mmol/L (ref 98–111)
Creatinine, Ser: 0.76 mg/dL (ref 0.61–1.24)
Creatinine, Ser: 0.75 mg/dL (ref 0.61–1.24)
GFR, Estimated: >60 mL/min (ref >60)
GFR, Estimated: >60 mL/min (ref >60)
Glucose, Bld: 112 mg/dL — ABNORMAL HIGH (ref 70–99)
Glucose, Bld: 102 mg/dL — ABNORMAL HIGH (ref 70–99)
Potassium: 4.1 mmol/L (ref 3.5–5.1)
Potassium: 4.6 mmol/L (ref 3.5–5.1)
Sodium: 133 mmol/L — ABNORMAL LOW (ref 135–145)
Sodium: 135 mmol/L (ref 135–145)

## 2020-05-16 LAB — POCT I-STAT 7, (LYTES, BLD GAS, ICA,H+H)
Acid-Base Excess: 4 mmol/L — ABNORMAL HIGH (ref 0.0–2.0)
Bicarbonate: 28.8 mmol/L — ABNORMAL HIGH (ref 20.0–28.0)
Calcium, Ion: 1.23 mmol/L (ref 1.15–1.40)
HCT: 29 % — ABNORMAL LOW (ref 39.0–52.0)
Hemoglobin: 9.9 g/dL — ABNORMAL LOW (ref 13.0–17.0)
O2 Saturation: 100 %
Potassium: 4.2 mmol/L (ref 3.5–5.1)
Sodium: 136 mmol/L (ref 135–145)
TCO2: 30 mmol/L (ref 22–32)
pCO2 arterial: 43.7 mmHg (ref 32.0–48.0)
pH, Arterial: 7.427 (ref 7.350–7.450)
pO2, Arterial: 554 mmHg — ABNORMAL HIGH (ref 83.0–108.0)

## 2020-05-16 LAB — GLUCOSE, CAPILLARY
Glucose-Capillary: 101 mg/dL — ABNORMAL HIGH (ref 70–99)
Glucose-Capillary: 106 mg/dL — ABNORMAL HIGH (ref 70–99)
Glucose-Capillary: 115 mg/dL — ABNORMAL HIGH (ref 70–99)
Glucose-Capillary: 82 mg/dL (ref 70–99)
Glucose-Capillary: 93 mg/dL (ref 70–99)
Glucose-Capillary: 93 mg/dL (ref 70–99)
Glucose-Capillary: 96 mg/dL (ref 70–99)

## 2020-05-16 LAB — POCT I-STAT, CHEM 8
BUN: 18 mg/dL (ref 8–23)
Calcium, Ion: 1.21 mmol/L (ref 1.15–1.40)
Chloride: 101 mmol/L (ref 98–111)
Creatinine, Ser: 0.5 mg/dL — ABNORMAL LOW (ref 0.61–1.24)
Glucose, Bld: 98 mg/dL (ref 70–99)
HCT: 30 % — ABNORMAL LOW (ref 39.0–52.0)
Hemoglobin: 10.2 g/dL — ABNORMAL LOW (ref 13.0–17.0)
Potassium: 4.2 mmol/L (ref 3.5–5.1)
Sodium: 136 mmol/L (ref 135–145)
TCO2: 26 mmol/L (ref 22–32)

## 2020-05-16 LAB — COOXEMETRY PANEL
Carboxyhemoglobin: 2.3 % — ABNORMAL HIGH (ref 0.5–1.5)
Carboxyhemoglobin: 2 % — ABNORMAL HIGH (ref 0.5–1.5)
Methemoglobin: 1.1 % (ref 0.0–1.5)
Methemoglobin: 0.8 % (ref 0.0–1.5)
O2 Saturation: 59.2 %
O2 Saturation: 51.3 %
Total hemoglobin: 8 g/dL — ABNORMAL LOW (ref 12.0–16.0)
Total hemoglobin: 9.1 g/dL — ABNORMAL LOW (ref 12.0–16.0)

## 2020-05-16 LAB — LACTATE DEHYDROGENASE: LDH: 207 U/L — ABNORMAL HIGH (ref 98–192)

## 2020-05-16 LAB — HEPARIN LEVEL (UNFRACTIONATED): Heparin Unfractionated: 0.3 IU/mL (ref 0.30–0.70)

## 2020-05-16 LAB — PREPARE RBC (CROSSMATCH)

## 2020-05-16 SURGERY — REMOVAL, CARDIAC ASSIST DEVICE, IMPELLA
Anesthesia: General | Site: Chest

## 2020-05-16 MED ORDER — 0.9 % SODIUM CHLORIDE (POUR BTL) OPTIME
TOPICAL | Status: DC | PRN
  Administered 2020-05-16: 11:00:00 2000 mL

## 2020-05-16 MED ORDER — LOSARTAN POTASSIUM 25 MG PO TABS: 12.5000 mg | ORAL_TABLET | Freq: Every day | ORAL | Status: DC

## 2020-05-16 MED ORDER — SODIUM BICARBONATE 650 MG PO TABS
650.0000 mg | ORAL_TABLET | Freq: Once | ORAL | Status: AC
  Administered 2020-05-16: 22:00:00 650 mg
  Filled 2020-05-16: qty 1

## 2020-05-16 MED ORDER — VANCOMYCIN HCL 1000 MG IV SOLR
INTRAVENOUS | Status: AC
  Filled 2020-05-16: qty 1000

## 2020-05-16 MED ORDER — LACTATED RINGERS IV SOLN: INTRAVENOUS | Status: DC | PRN

## 2020-05-16 MED ORDER — APIXABAN 5 MG PO TABS: 5.0000 mg | ORAL_TABLET | Freq: Two times a day (BID) | ORAL | Status: DC

## 2020-05-16 MED ORDER — ROCURONIUM BROMIDE 10 MG/ML (PF) SYRINGE
PREFILLED_SYRINGE | INTRAVENOUS | Status: DC | PRN
  Administered 2020-05-16 (×2): 50 mg via INTRAVENOUS

## 2020-05-16 MED ORDER — PANCRELIPASE (LIP-PROT-AMYL) 10440-39150 UNITS PO TABS
20880.0000 [IU] | ORAL_TABLET | Freq: Once | ORAL | Status: AC
  Administered 2020-05-16: 22:00:00 20880 [IU]
  Filled 2020-05-16: qty 2

## 2020-05-16 MED ORDER — ONDANSETRON HCL 4 MG/2ML IJ SOLN
INTRAMUSCULAR | Status: DC | PRN
  Administered 2020-05-16: 4 mg via INTRAVENOUS

## 2020-05-16 MED ORDER — APIXABAN 2.5 MG PO TABS
2.5000 mg | ORAL_TABLET | Freq: Once | ORAL | Status: AC
  Administered 2020-05-16: 22:00:00 2.5 mg via ORAL
  Filled 2020-05-16: qty 1

## 2020-05-16 MED ORDER — ETOMIDATE 2 MG/ML IV SOLN
INTRAVENOUS | Status: DC | PRN
  Administered 2020-05-16: 10 mg via INTRAVENOUS

## 2020-05-16 MED ORDER — PHENYLEPHRINE HCL-NACL 10-0.9 MG/250ML-% IV SOLN
INTRAVENOUS | Status: DC | PRN
  Administered 2020-05-16: 25 ug/min via INTRAVENOUS

## 2020-05-16 MED ORDER — SODIUM CHLORIDE 0.9 % IV SOLN
INTRAVENOUS | Status: AC
  Filled 2020-05-16: qty 1.2

## 2020-05-16 MED ORDER — APIXABAN 5 MG PO TABS
5.0000 mg | ORAL_TABLET | Freq: Two times a day (BID) | ORAL | Status: DC
  Administered 2020-05-17 – 2020-05-23 (×13): 5 mg via ORAL
  Filled 2020-05-16 (×13): qty 1

## 2020-05-16 MED ORDER — CEFAZOLIN SODIUM 1 G IJ SOLR
INTRAMUSCULAR | Status: AC
  Filled 2020-05-16: qty 20

## 2020-05-16 MED ORDER — MILRINONE LACTATE IN DEXTROSE 20-5 MG/100ML-% IV SOLN
0.1250 ug/kg/min | INTRAVENOUS | Status: DC
  Administered 2020-05-16 – 2020-05-23 (×6): 0.125 ug/kg/min via INTRAVENOUS
  Filled 2020-05-16 (×6): qty 100

## 2020-05-16 MED ORDER — CEFAZOLIN SODIUM-DEXTROSE 2-3 GM-%(50ML) IV SOLR
INTRAVENOUS | Status: DC | PRN
  Administered 2020-05-16: 2 g via INTRAVENOUS

## 2020-05-16 MED ORDER — VANCOMYCIN HCL 1000 MG IV SOLR
INTRAVENOUS | Status: DC | PRN
  Administered 2020-05-16: 1000 mg

## 2020-05-16 MED ORDER — FENTANYL CITRATE (PF) 250 MCG/5ML IJ SOLN
INTRAMUSCULAR | Status: DC | PRN
  Administered 2020-05-16: 150 ug via INTRAVENOUS

## 2020-05-16 MED ORDER — HEMOSTATIC AGENTS (NO CHARGE) OPTIME
TOPICAL | Status: DC | PRN
Start: 2020-05-16 — End: 2020-05-16
  Administered 2020-05-16: 1 via TOPICAL

## 2020-05-16 MED ORDER — DEXAMETHASONE SODIUM PHOSPHATE 4 MG/ML IJ SOLN
INTRAMUSCULAR | Status: DC | PRN
  Administered 2020-05-16: 4 mg via INTRAVENOUS

## 2020-05-16 MED ORDER — FENTANYL CITRATE (PF) 250 MCG/5ML IJ SOLN
INTRAMUSCULAR | Status: AC
  Filled 2020-05-16: qty 5

## 2020-05-16 MED ORDER — PHENOL 1.4 % MT LIQD: 1.0000 | OROMUCOSAL | Status: DC | PRN

## 2020-05-16 MED ORDER — SUGAMMADEX SODIUM 200 MG/2ML IV SOLN
INTRAVENOUS | Status: DC | PRN
  Administered 2020-05-16: 200 mg via INTRAVENOUS

## 2020-05-16 SURGICAL SUPPLY — 66 items
ATTRACTOMAT 16X20 MAGNETIC DRP (DRAPES) IMPLANT
BAG DECANTER FOR FLEXI CONT (MISCELLANEOUS) IMPLANT
BLADE CLIPPER SURG (BLADE) IMPLANT
BLADE SURG 12 STRL SS (BLADE) ×3 IMPLANT
BRUSH SCRUB EZ PLAIN DRY (MISCELLANEOUS) ×6 IMPLANT
CANISTER SUCT 3000ML PPV (MISCELLANEOUS) ×3 IMPLANT
CLIP VESOCCLUDE MED 6/CT (CLIP) ×3 IMPLANT
CLIP VESOCCLUDE SM WIDE 6/CT (CLIP) ×3 IMPLANT
COVER SURGICAL LIGHT HANDLE (MISCELLANEOUS) ×3 IMPLANT
DRAIN CHANNEL 10M FLAT 3/4 FLT (DRAIN) ×3 IMPLANT
DRAPE CHEST BREAST 15X10 FENES (DRAPES) ×3 IMPLANT
DRAPE SLUSH MACHINE 52X66 (DRAPES) ×3 IMPLANT
DRSG AQUACEL AG ADV 3.5X 4 (GAUZE/BANDAGES/DRESSINGS) ×3 IMPLANT
DRSG AQUACEL AG ADV 3.5X 6 (GAUZE/BANDAGES/DRESSINGS) ×6 IMPLANT
ELECT BLADE 4.0 EZ CLEAN MEGAD (MISCELLANEOUS) ×6
ELECT BLADE 6.5 EXT (BLADE) ×3 IMPLANT
ELECT CAUTERY BLADE 6.4 (BLADE) IMPLANT
ELECT REM PT RETURN 9FT ADLT (ELECTROSURGICAL) ×6
ELECTRODE BLDE 4.0 EZ CLN MEGD (MISCELLANEOUS) ×2 IMPLANT
ELECTRODE REM PT RTRN 9FT ADLT (ELECTROSURGICAL) ×2 IMPLANT
EVACUATOR SILICONE 100CC (DRAIN) ×3 IMPLANT
GAUZE SPONGE 4X4 12PLY STRL (GAUZE/BANDAGES/DRESSINGS) ×3 IMPLANT
GAUZE SPONGE 4X4 12PLY STRL LF (GAUZE/BANDAGES/DRESSINGS) ×3 IMPLANT
GLOVE BIO SURGEON STRL SZ 6.5 (GLOVE) ×4 IMPLANT
GLOVE BIO SURGEON STRL SZ7.5 (GLOVE) ×12 IMPLANT
GLOVE BIO SURGEONS STRL SZ 6.5 (GLOVE) ×2
GOWN STRL REUS W/ TWL LRG LVL3 (GOWN DISPOSABLE) ×3 IMPLANT
GOWN STRL REUS W/TWL LRG LVL3 (GOWN DISPOSABLE) ×9
HANDLE STAPLE  ENDO EGIA 4 STD (STAPLE) ×2
HANDLE STAPLE ENDO EGIA 4 STD (STAPLE) ×1 IMPLANT
KIT BASIN OR (CUSTOM PROCEDURE TRAY) ×3 IMPLANT
KIT TURNOVER KIT B (KITS) ×3 IMPLANT
NS IRRIG 1000ML POUR BTL (IV SOLUTION) ×6 IMPLANT
PACK GENERAL/GYN (CUSTOM PROCEDURE TRAY) ×3 IMPLANT
PACK OPEN HEART (CUSTOM PROCEDURE TRAY) ×3 IMPLANT
PACK UNIVERSAL I (CUSTOM PROCEDURE TRAY) ×3 IMPLANT
PAD ARMBOARD 7.5X6 YLW CONV (MISCELLANEOUS) ×6 IMPLANT
PAD ELECT DEFIB RADIOL ZOLL (MISCELLANEOUS) ×3 IMPLANT
POSITIONER HEAD DONUT 9IN (MISCELLANEOUS) ×3 IMPLANT
POWDER SURGICEL 3.0 GRAM (HEMOSTASIS) ×3 IMPLANT
RELOAD TRI 2.0 30 VAS MED SUL (STAPLE) ×3 IMPLANT
SET TUBE SMOKE EVAC HIGH FLOW (TUBING) IMPLANT
SPONGE LAP 18X18 RF (DISPOSABLE) ×3 IMPLANT
SUT ETHILON 3 0 FSL (SUTURE) ×6 IMPLANT
SUT PROLENE 4 0 RB 1 (SUTURE) ×3
SUT PROLENE 4-0 RB1 .5 CRCL 36 (SUTURE) ×1 IMPLANT
SUT PROLENE 5 0 C 1 36 (SUTURE) ×3 IMPLANT
SUT SILK  1 MH (SUTURE) ×2
SUT SILK 1 MH (SUTURE) ×1 IMPLANT
SUT SILK 1 TIES 10X30 (SUTURE) ×3 IMPLANT
SUT SILK 2 0 SH CR/8 (SUTURE) ×3 IMPLANT
SUT VIC AB 1 CT1 18XCR BRD 8 (SUTURE) ×1 IMPLANT
SUT VIC AB 1 CT1 27 (SUTURE) ×3
SUT VIC AB 1 CT1 27XBRD ANBCTR (SUTURE) ×1 IMPLANT
SUT VIC AB 1 CT1 8-18 (SUTURE) ×3
SUT VIC AB 1 CTX 36 (SUTURE) ×3
SUT VIC AB 1 CTX36XBRD ANBCTR (SUTURE) ×1 IMPLANT
SUT VIC AB 2-0 CT1 27 (SUTURE) ×6
SUT VIC AB 2-0 CT1 TAPERPNT 27 (SUTURE) ×2 IMPLANT
SUT VIC AB 3-0 SH 8-18 (SUTURE) ×3 IMPLANT
SUT VIC AB 3-0 X1 27 (SUTURE) IMPLANT
SYR BULB IRRIG 60ML STRL (SYRINGE) ×3 IMPLANT
TAPE CLOTH SURG 4X10 WHT LF (GAUZE/BANDAGES/DRESSINGS) ×3 IMPLANT
TOWEL GREEN STERILE (TOWEL DISPOSABLE) ×3 IMPLANT
TOWEL GREEN STERILE FF (TOWEL DISPOSABLE) ×3 IMPLANT
WATER STERILE IRR 1000ML POUR (IV SOLUTION) ×3 IMPLANT

## 2020-05-16 NOTE — Progress Notes (Signed)
OT Cancellation Note  Patient Details Name: Cody Arellano MRN: [id number] DOB: [birth year]   Cancelled Treatment:    Reason Eval/Treat Not Completed: Patient at procedure or test/ unavailable (Off the floor at OR for impella removal. Will return as schedule allows.)  [place], OTR/L Acute Rehab Pager: [phone] Office: [phone] 05/16/2020, 1:19 PM

## 2020-05-16 NOTE — Plan of Care (Signed)
  Problem: Education: Goal: Knowledge of General Education information will improve Description: Including pain rating scale, medication(s)/side effects and non-pharmacologic comfort measures Outcome: Progressing   Problem: Clinical Measurements: Goal: Ability to maintain clinical measurements within normal limits will improve Outcome: Progressing Goal: Will remain free from infection Outcome: Progressing Goal: Diagnostic test results will improve Outcome: Progressing Goal: Respiratory complications will improve Outcome: Progressing Goal: Cardiovascular complication will be avoided Outcome: Progressing   Problem: Activity: Goal: Risk for activity intolerance will decrease Outcome: Progressing   Problem: Nutrition: Goal: Adequate nutrition will be maintained Outcome: Progressing   Problem: Coping: Goal: Level of anxiety will decrease Outcome: Progressing   Problem: Elimination: Goal: Will not experience complications related to bowel motility Outcome: Progressing Goal: Will not experience complications related to urinary retention Outcome: Progressing   Problem: Pain Managment: Goal: General experience of comfort will improve Outcome: Progressing   Problem: Safety: Goal: Ability to remain free from injury will improve Outcome: Progressing   Problem: Skin Integrity: Goal: Risk for impaired skin integrity will decrease Outcome: Progressing   Problem: Education: Goal: Understanding of CV disease, CV risk reduction, and recovery process will improve Outcome: Progressing Goal: Individualized Educational Video(s) Outcome: Progressing   Problem: Activity: Goal: Ability to return to baseline activity level will improve Outcome: Progressing   Problem: Cardiovascular: Goal: Ability to achieve and maintain adequate cardiovascular perfusion will improve Outcome: Progressing Goal: Vascular access site(s) Level 0-1 will be maintained Outcome: Progressing   Problem:  Cardiac: Goal: Ability to achieve and maintain adequate cardiopulmonary perfusion will improve Outcome: Progressing   Pt is free of S/S of infection Labs normalizing Resp clear Impella on P3 with plan to discontinue tomorrow, remains off Levo, in SB, Maintaining SBP >95 Increased appetite today, asking for evening snack Elimination WNL C/o pain to penis, relief from scheduled and PRN meds  Small tear to scrotal area, clean and healing, foam dressing on sacrum Cody Arellano is eager to make progress, keeps close communication with family and is involved with his care

## 2020-05-16 NOTE — Progress Notes (Signed)
Patient ID: Cody Arellano, male   DOB: 06-Feb-1946, 74 y.o.   MRN: [id number] TCTS Evening Rounds:  Impella removed today. Subclavian site is dry. Hemodynamics stable.

## 2020-05-16 NOTE — Anesthesia Preprocedure Evaluation (Addendum)
Anesthesia Evaluation  Patient identified by MRN, date of birth, ID band Patient awake    Reviewed: Allergy & Precautions, NPO status , Patient's Chart, lab work & pertinent test results  History of Anesthesia Complications Negative for: history of anesthetic complications  Airway Mallampati: II  TM Distance: >3 FB Neck ROM: Full    Dental  (+) Chipped, Dental Advisory Given   Pulmonary shortness of breath, former smoker,  NCO2 after extubation yesterday   breath sounds clear to auscultation       Cardiovascular hypertension, Pt. on medications + angina + CAD, + Past MI, + Cardiac Stents, + CABG and +CHF  + dysrhythmias  Rhythm:Regular Rate:Normal  05/04/2020 ECHO: EF 20-25%, global LV hypokinesis, mild RV hypokinesis  Impella   Neuro/Psych  Headaches, Anxiety Depression 04/26/2020 SARS coronavirus NEG    GI/Hepatic negative GI ROS, Neg liver ROS,   Endo/Other  SLE  Renal/GU Renal InsufficiencyRenal disease  negative genitourinary   Musculoskeletal negative musculoskeletal ROS (+)   Abdominal (+)  Abdomen: soft. Bowel sounds: normal.  Peds  Hematology  (+) Blood dyscrasia (Hb 8.0, plt 97K), anemia , Eliquis INR 2.1   Anesthesia Other Findings   Reproductive/Obstetrics                            Anesthesia Physical  Anesthesia Plan  ASA: IV  Anesthesia Plan: General   Post-op Pain Management:    Induction: Intravenous  PONV Risk Score and Plan: 2 and Treatment may vary due to age or medical condition and Ondansetron  Airway Management Planned: Mask and Oral ETT  Additional Equipment: Arterial line, CVP, PA Cath, TEE and 3D TEE  Intra-op Plan:   Post-operative Plan: Extubation in OR  Informed Consent: I have reviewed the patients History and Physical, chart, labs and discussed the procedure including the risks, benefits and alternatives for the proposed anesthesia with the  patient or authorized representative who has indicated his/her understanding and acceptance.     Dental advisory given  Plan Discussed with: CRNA and Surgeon  Anesthesia Plan Comments: (ECHO 05/12/20: 1. Left ventricular ejection fraction, by estimation, is 20 to 25%. The  left ventricle has severely decreased function. The left ventricle  demonstrates global hypokinesis with relative preservation of the lateral  wall. Impella catheter is in reasonable  position at 4.6 cm.  2. Right ventricular systolic function is moderately reduced. The right  ventricular size is moderately enlarged.  3. Left atrial size was mildly dilated.  4. Right atrial size was mildly dilated. )       Anesthesia Quick Evaluation

## 2020-05-16 NOTE — Progress Notes (Signed)
10 Days Post-Op Procedure(s) (LRB): TRANSESOPHAGEAL ECHOCARDIOGRAM (TEE) (N/A) Subjective: comfortable up in chair impella weaned to P3 over weekend with stable hemodynamics impella removal procedure discussed with patient and son Objective: Vital signs in last 24 hours: Temp:  [98.24 F (36.8 C)-99.32 F (37.4 C)] 98.6 F (37 C) (12/13 0700) Pulse Rate:  [52-59] 56 (12/13 0700) Cardiac Rhythm: Sinus bradycardia (12/13 0400) Resp:  [15-21] 17 (12/13 0700) SpO2:  [88 %-100 %] 98 % (12/13 0700) Arterial Line BP: (80-159)/(46-78) 110/50 (12/13 0700) Weight:  [63.9 kg] 63.9 kg (12/13 0700)  Hemodynamic parameters for last 24 hours: PAP: (27-48)/(6-19) 36/10 CVP:  [0 mmHg-12 mmHg] 3 mmHg CO:  [4.3 L/min-5.5 L/min] 4.8 L/min CI:  [2.3 L/min/m2-2.9 L/min/m2] 2.5 L/min/m2  Intake/Output from previous day: 12/12 0701 - 12/13 0700 In: 1130.1 [P.O.:360; I.V.:83.9; NG/GT:404] Out: 3850 [Urine:3850] Intake/Output this shift: No intake/output data recorded.       Exam    General- alert and comfortable. Impella 5.5 secure in R axillary artery graft    Neck- no JVD, no cervical adenopathy palpable, no carotid bruit   Lungs- clear without rales, wheezes   Cor- regular rate and rhythm, no murmur , gallop   Abdomen- soft, non-tender   Extremities - warm, non-tender, minimal edema   Neuro- oriented, appropriate, no focal weakness   Lab Results: Recent Labs    05/15/20 0347 05/16/20 0347  WBC 8.0 6.8  HGB 8.5* 8.3*  HCT 26.7* 27.7*  PLT 238 233   BMET:  Recent Labs    05/15/20 0907 05/16/20 0347  NA 133* 133*  K 4.3 4.1  CL 102 100  CO2 23 26  GLUCOSE 143* 112*  BUN 20 22  CREATININE 0.74 0.76  CALCIUM 8.2* 8.3*    PT/INR: No results for input(s): LABPROT, INR in the last 72 hours. ABG    Component Value Date/Time   PHART 7.473 (H) 05/05/2020 1010   HCO3 26.6 05/05/2020 1010   TCO2 28 05/05/2020 1010   ACIDBASEDEF 1.0 05/03/2020 1334   O2SAT 59.2 05/16/2020 0332    CBG (last 3)  Recent Labs    05/16/20 0003 05/16/20 0332 05/16/20 0731  GLUCAP 115* 106* 101*    Assessment/Plan: S/P Procedure(s) (LRB): TRANSESOPHAGEAL ECHOCARDIOGRAM (TEE) (N/A) Plan removal of R axillary Impella 5.5 lvad I have discussed the benefits and risks of the procedure with patient and son.   LOS: 20 days    Tharon Aquas Trigt III 05/16/2020

## 2020-05-16 NOTE — Care Plan (Signed)
Variation in LV signal on Impella noted, remainder of assessment WNL. Dr. Kalman Shan from Cardiology notified, at bedside to assess pt  @ 03:10. Placement correct per Dr. Kalman Shan. Charge Nurse notified. Will continue to monitor pt

## 2020-05-16 NOTE — Progress Notes (Addendum)
This chaplain responded to PMT consult for spiritual care.  The chaplain understands from PMT the spiritual care consult is for Advance Directive review and education with the Pt. and his wife.  The Pt. is out of the room at the time of the chaplain's arrival.  The chaplain will F/U with spiritual care as needed.  *This chaplain learned Pt. AD education is complete, notarization is needed.

## 2020-05-16 NOTE — Progress Notes (Signed)
Atempted to start nocturnal tube feedings per MD order. Tube clogged. Unable to flush, will initiate declogging protocol.

## 2020-05-16 NOTE — Brief Op Note (Signed)
04/26/2020 - 05/16/2020  12:24 PM  PATIENT:  Cody Arellano  74 y.o. male  PRE-OPERATIVE DIAGNOSIS:  remove impella  POST-OPERATIVE DIAGNOSIS:  remove impella  PROCEDURE:  Procedure(s): REMOVAL OF IMPELLA LEFT VENTRICULAR ASSIST DEVICE (N/A)  SURGEON:  Surgeon(s) and Role:    Ivin Poot, MD - Primary  PHYSICIAN ASSISTANT:   ASSISTANTS: none   ANESTHESIA:   general  EBL:  25 mL   BLOOD ADMINISTERED:none  DRAINS: (flat) Jackson-Pratt drain(s) with closed bulb suction in the axillary artery exposure   LOCAL MEDICATIONS USED:  NONE  SPECIMEN:  No Specimen  DISPOSITION OF SPECIMEN:  N/A  COUNTS:  YES  TOURNIQUET:  * No tourniquets in log *  DICTATION: .Dragon Dictation  PLAN OF CARE: return to [place] -17  PATIENT DISPOSITION:  ICU - extubated and stable.   Delay start of Pharmacological VTE agent (>24hrs) due to surgical blood loss or risk of bleeding: yes No anticoagulation until 0700 12-14

## 2020-05-16 NOTE — Transfer of Care (Addendum)
Immediate Anesthesia Transfer of Care Note  Patient: Cody Arellano  Procedure(s) Performed: REMOVAL OF IMPELLA LEFT VENTRICULAR ASSIST DEVICE (N/A Chest)  Patient Location: ICU  Anesthesia Type:General  Level of Consciousness: awake and patient cooperative  Airway & Oxygen Therapy: Patient Spontanous Breathing and Patient connected to face mask oxygen  Post-op Assessment: Report given to RN and Post -op Vital signs reviewed and stable  Post vital signs: Reviewed and stable  Last Vitals:  Vitals Value Taken Time  BP    Temp    Pulse    Resp    SpO2      Last Pain:  Vitals:   05/16/20 0800  TempSrc: Core  PainSc:       Patients Stated Pain Goal: 2 (00/34/96 1164)  Complications: No complications documented.

## 2020-05-16 NOTE — Progress Notes (Signed)
Calorie Count Note  RD placed calorie count envelope on the patient's door. Nursing to document percent consumed for each item on the patient's meal tray ticket and keep in envelope. Also to document percent of any supplement or snack pt consumes and keep documentation in envelope for RD to review.   Cody Arellano RD, LDN Clinical Nutrition Pager listed in [place]

## 2020-05-16 NOTE — Progress Notes (Signed)
Patient ID: Cody Arellano, male   DOB: [birth year], 74 y.o.   MRN: [id number]     Advanced Heart Failure Rounding Note  PCP-Cardiologist: Mertie Moores, MD   Subjective:    Impella 5.5 placed on 11/30 with cardiogenic shock.  Heparin systemically and in purge.  MAP stable today. I/Os negative on po Lasix.   VT 12/7 early am had DCCV, converted back to afib.  Later went into a junctional rhythm and amiodarone stopped, then converted to NSR.     CXR with possible PNA right lung.  He has completed course of vancomycin/cefepime, now afebrile.   No dyspnea or chest pain.  Walked down hall again yesterday.  Eating about half of meals and has TFs running via [place] at night.    Platelets back to normal range.  HIT negative. He got 1u RBCs 12/4 and again 12/6. hgb 8.3.   TEE-guided DCCV on 12/3. TEE with mildly dilated LV, EF 20%, mildly dilated RV with moderately decreased systolic function, moderate MR.  He remains in NSR today.   - Echo: EF 20-25% with septal akinesis, mid to apical inferior akinesis, apical anterior and apex akinesis.  Moderately dilated and dysfunctional RV.  Moderate TR.  Severe MR.  Bicuspid aortic valve with no stenosis, mild aortic insufficiency.  - LHC:  1. Dominant LCx, no significant disease in this vessel.  2. 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  The LIMA-LAD is also present.  3. The SVG-ramus has been occluded.  There is severe and diffuse in-stent restenosis in the proximal ramus.   Impella 5.5 P3 Flow 1.9 L, no alarms LDH 231 -> 251 -> 236 -> 212 -> 247 -> 225 -> 207  [place] numbers: CVP 3 PA 48/16 CI 2.5 Co-ox 59%  Objective:   Weight Range: 63.9 kg Body mass index is 19.11 kg/m.   Vital Signs:   Temp:  [98.24 F (36.8 C)-99.32 F (37.4 C)] 98.6 F (37 C) (12/13 0700) Pulse Rate:  [52-59] 56 (12/13 0700) Resp:  [15-21] 17 (12/13 0700) SpO2:  [88 %-100 %] 98 % (12/13 0700) Arterial Line BP: (80-159)/(46-78) 110/50  (12/13 0700) Weight:  [63.9 kg] 63.9 kg (12/13 0700) Last BM Date: 05/15/20  Weight change: Filed Weights   05/13/20 0500 05/15/20 0500 05/16/20 0700  Weight: 68.8 kg 65.6 kg 63.9 kg    Intake/Output:   Intake/Output Summary (Last 24 hours) at 05/16/2020 0804 Last data filed at 05/16/2020 0700 Gross per 24 hour  Intake 1054.91 ml  Output 3850 ml  Net -2795.09 ml      Physical Exam    General: NAD Neck: No JVD, no thyromegaly or thyroid nodule.  Lungs: Clear to auscultation bilaterally with normal respiratory effort. CV: Nondisplaced PMI.  Heart regular S1/S2, no S3/S4, 1/6 HSM LLSB/apex.  No peripheral edema.  Abdomen: Soft, nontender, no hepatosplenomegaly, no distention.  Skin: Intact without lesions or rashes.  Neurologic: Alert and oriented x 3.  Psych: Normal affect. Extremities: No clubbing or cyanosis.  HEENT: Normal.    Telemetry    Sinus 50s-60s Personally reviewed   Labs    CBC Recent Labs    05/15/20 0347 05/16/20 0347  WBC 8.0 6.8  HGB 8.5* 8.3*  HCT 26.7* 27.7*  MCV 87.5 89.9  PLT 238 034   Basic Metabolic Panel Recent Labs    05/13/20 1432 05/14/20 0510 05/15/20 0907 05/16/20 0347  NA  --    < > 133* 133*  K  --    < >  4.3 4.1  CL  --    < > 102 100  CO2  --    < > 23 26  GLUCOSE  --    < > 143* 112*  BUN  --    < > 20 22  CREATININE  --    < > 0.74 0.76  CALCIUM  --    < > 8.2* 8.3*  PHOS 3.2  --   --   --    < > = values in this interval not displayed.   Liver Function Tests No results for input(s): AST, ALT, ALKPHOS, BILITOT, PROT, ALBUMIN in the last 72 hours. No results for input(s): LIPASE, AMYLASE in the last 72 hours. Cardiac Enzymes No results for input(s): CKTOTAL, CKMB, CKMBINDEX, TROPONINI in the last 72 hours.  BNP: BNP (last 3 results) Recent Labs    04/06/20 2134 04/26/20 1319 04/30/20 0248  BNP 1,677.6* 1,824.4* 1,685.5*    ProBNP (last 3 results) No results for input(s): PROBNP in the last 8760  hours.   D-Dimer No results for input(s): DDIMER in the last 72 hours. Hemoglobin A1C No results for input(s): HGBA1C in the last 72 hours. Fasting Lipid Panel No results for input(s): CHOL, HDL, LDLCALC, TRIG, CHOLHDL, LDLDIRECT in the last 72 hours. Thyroid Function Tests No results for input(s): TSH, T4TOTAL, T3FREE, THYROIDAB in the last 72 hours.  Invalid input(s): FREET3  Other results:   Imaging    No results found.   Medications:     Scheduled Medications:  sodium chloride   Intravenous Once   amiodarone  200 mg Oral BID   aspirin  81 mg Oral Daily   azaTHIOprine  100 mg Oral BID   chlorhexidine  15 mL Mouth Rinse BID   Chlorhexidine Gluconate Cloth  6 each Topical Daily   digoxin  0.125 mg Oral Daily   docusate  100 mg Oral BID   docusate sodium  100 mg Oral BID   dronabinol  2.5 mg Oral BID AC   feeding supplement  237 mL Oral BID BM   feeding supplement (PROSource TF)  45 mL Per Tube QID   furosemide  40 mg Oral Daily   hydroxychloroquine  400 mg Oral QHS   losartan  12.5 mg Oral Daily   mouth rinse  15 mL Mouth Rinse q12n4p   multivitamin with minerals  1 tablet Oral Daily   phenazopyridine  100 mg Oral TID WC   rosuvastatin  20 mg Oral Daily   sodium chloride flush  10-40 mL Intracatheter Q12H   sodium chloride flush  3 mL Intravenous Q12H   spironolactone  12.5 mg Oral Daily   tamsulosin  0.4 mg Oral Daily    Infusions:  sodium chloride Stopped (05/06/20 0858)   sodium chloride     sodium chloride Stopped (05/12/20 0447)   sodium chloride Stopped (05/12/20 0447)   sodium chloride Stopped (05/12/20 0445)   dextrose Stopped (05/06/20 2218)   feeding supplement (VITAL 1.5 CAL) Stopped (05/16/20 0000)   heparin 350 Units/hr (05/16/20 0700)   impella catheter heparin 50 unit/mL in dextrose 5%     lactated ringers Stopped (05/11/20 0800)    PRN Medications: Place/Maintain arterial line **AND** sodium chloride,  sodium chloride, acetaminophen (TYLENOL) oral liquid 160 mg/5 mL, bisacodyl, fentaNYL (SUBLIMAZE) injection, lidocaine, ondansetron (ZOFRAN) IV, oxyCODONE, polyethylene glycol, Resource ThickenUp Clear, sodium chloride flush, sodium chloride flush, white petrolatum   Assessment/Plan   1. 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  Echo this admission looks worse than 10/21 with EF 20-25% with septal akinesis, mid to apical inferior akinesis, apical anterior and apex akinesis, moderately dilated and dysfunctional RV, moderate TR, severe MR, bicuspid aortic valve with no stenosis, mild aortic insufficiency. Progressive/end-stage CHF worsened by onset of atrial fibrillation. Cardiogenic shock 11/30 with co-ox down to 30%, Impella 5.5 placed to allow time to stabilize and consider further steps.  Currently with CI 2.5 and co-ox 59% on P3 Impella with no alarms. Waveforms look good. On heparin gtt. CVP 3 on po Lasix. LDH stable, no evidence for significant hemolysis.     - Add losartan 12.5 mg daily.  - Can decrease Lasix to 20 mg daily.  - Plan to remove Impella today (Dr. Prescott Gum).  If cardiac output falls with Impella out, will start milrinone.  Can get Luiz Blare out tomorrow if numbers are stable.    - Continue digoxin  - Continue spironolactone 12.5 daily.  - He has end-stage biventricular HF with cardiogenic shock physiology in the setting of AF/RVR and severe MR and advanced ischemic CM.  He now s/p Impella 5.5 to support DC-CV and medical optimization.  Now off inotropes. Maintaining NSR. He is not a transplant candidate.  Barriers to LVAD include nutritional status/deconditioning and RV.  I think RV would be acceptable.  He is getting tube feeds and Ensure.  We discussed in [place], not LVAD candidate at this point with malnutrition/deconditioning.  Removing Impella today, potentially to milrinone to allow time to improve nutritional status and mobility (hopefully to CIR).  -  Palliative Care has seen and continue to discuss [place] 2. CAD: Prior history of MI with LAD and ramus PCI. Admitted May 2021 with anterior STEMI. LHC with occluded ostial LAD (in-stent), 90% ostial ramus, 60-70% proximal LCx, nondominant RCA. PTCA to LAD and ramus to restore flow, then CABG with LIMA-LAD and SVG-ramus.  No chest pain, doubt ACS.  Fall in EF from 10/21 to 11/21, but he did not present with ACS.  [place] 12/1 showed patient LIMA-LAD, patent native LAD, patent dominant LCx.  The SVG-ramus is occluded with severe diffuse in-stent restenosis in proximal ramus.  Ramus not intervened upon as this would be a complex intervention and unlikely to markedly improve EF. No chest pain.  - Continue ASA 81 and statin.    3. AKI: Creatinine now trending down with improved cardiac output. Renal function normalized.   4. Right pleural effusion: S/p thoracentesis, transudative (CHF).  5. Atrial fibrillation/flutter with RVR: DCCV on 12/3, went back to NSR 12/7. Remains in NSR.  - Continue amiodarone at 200 mg bid.   -  IV heparin (to apixaban after Impella out).  6. Mitral regurgitation:  TEE 12/3 with moderate central MR.  Doubt MitraClip will help him much with moderate MR and he is likely too advanced.  7. H/o PE: In 10/21.  Keep anticoagulated, on heparin gtt.  8. Bicuspid aortic valve: Mild AI, no AS.  9. SLE: H/o pericarditis.  On Imuran and hydroxychloroquine.  - Restarted home SLE regimen now that he is taking po.   10. Pulmonary nodules/emphysema: CCM following, s/p bronch/BAL (no growth).  11. ID: Concern for right-sided PNA, ?aspiration.  Cultures NGTD.  Now afebrile and has completed course of vancomycin/cefepime.   12. Thrombocytopenia: Resolved.  May have been due to critical illness, low grade hemolysis with Impella. LDH ok.  HIT negative.  - Back on heparin gtt.  13. Hypokalemia/hypomag: Supplement as needed.  14. Acute blood loss  anemia: No obvious site.  Looks like less swelling at  Impella site.  Hgb 8.3 today.  - Transfuse hgb < 8.  15. VT: Episode early am 12/7, required DCCV.  No recurrence.  16. Dysuria: Negative UA.   - Restarted Flomax - Will use urinary tract analgesic.   CRITICAL CARE Performed by: Loralie Champagne  Total critical care time: 35 minutes  Critical care time was exclusive of separately billable procedures and treating other patients.  Critical care was necessary to treat or prevent imminent or life-threatening deterioration.  Critical care was time spent personally by me (independent of midlevel providers or residents) on the following activities: development of treatment plan with patient and/or surrogate as well as nursing, discussions with consultants, evaluation of patient's response to treatment, examination of patient, obtaining history from patient or surrogate, ordering and performing treatments and interventions, ordering and review of laboratory studies, ordering and review of radiographic studies, pulse oximetry and re-evaluation of patient's condition.   Length of Stay: [place], MD  05/16/2020, 8:04 AM  Advanced Heart Failure Team Pager [phone] (M-F; 7a - 4p)  Please contact [facility] for night-coverage after hours (4p -7a ) and weekends on [email or web address]

## 2020-05-16 NOTE — Op Note (Signed)
NAME: Cody Arellano, Cody Arellano MEDICAL RECORD GN:[id number] ACCOUNT [id number] DATE OF BIRTH:04-12-1946 FACILITY: [facility] LOCATION: [facility] PHYSICIAN:Jairen Goldfarb VAN TRIGT III, MD  OPERATIVE REPORT  DATE OF PROCEDURE:  05/16/2020  OPERATION:  Removal of Impella 5.5 left ventricular assist device.  SURGEON:  Ivin Poot, MD  ANESTHESIA:  General.  PREOPERATIVE DIAGNOSIS:  History of advanced heart failure with ischemic cardiomyopathy, status post placement of Impella 5.5 left ventricular assist device for cardiogenic shock.  POSTOPERATIVE DIAGNOSIS:  History of advanced heart failure with ischemic cardiomyopathy, status post placement of Impella 5.5 left ventricular assist device for cardiogenic shock.  CLINICAL NOTE:  The patient is a 74 year old gentleman who had an urgent multivessel CABG last year for non-STEMI with reduced LV function.  He returned to the hospital several months later with advanced heart failure and EF of 20-25%.  After evaluation  by the advanced heart failure service, he was recommended for placement of Impella 5.5 temporary percutaneous left ventricular assist device for hemodynamic support.  This was accomplished without complication.  Over the next several days, his heart  failure medications were titrated and he was able to diurese several pounds of fluid.  His overall clinical condition improved and the Impella support was weaned with maintenance of stable hemodynamics.  It was his cardiologist's recommendation that the  Impella be removed now that his hemodynamic performance was satisfactory.  The patient did not meet criteria for implantable LVAD due to comorbid medical conditions and overall weakness, fragility and malnutrition.  I discussed the procedure of Impella removal with the patient and his son including the benefits and risks and informed consent was obtained.  DESCRIPTION OF PROCEDURE:  The patient was brought to the operating room and placed supine on the  operating table.  General anesthesia was induced.  The patient remained stable.  A transesophageal echo probe was placed by the anesthesia team.  The chest  and upper abdomen was prepped and draped as a sterile field.  A proper time-out was performed.  The incisions in the right upper chest were opened where the Impella had been inserted in the axillary artery through a side graft and tunneled out inferiorly  in the anterior axillary line.  After the Impella had been freed up from the sutures securing it to the graft, the Impella speed was turned down and off as the Impella was removed from the heart without difficulty.  The graft was clamped.  The graft was also clamped in the axillary  artery exposure wound and the graft was then ligated and divided with a vascular stapler.  The wounds were irrigated with copious amounts of saline and vancomycin irrigation.  Hemostasis was somewhat difficult, but was finally obtained and was satisfactory.  A Jackson-Pratt flat drain was brought out through the exit site and the exit site was  secured with interrupted sutures.  The main incision was then closed with interrupted Vicryl for the fascia and subcutaneous layer and interrupted nylon for the skin.  Sterile dressings were applied.  Echo showed maintenance of ejection fraction of  approximately 25%-30% and no increase in inotropes was needed.  The patient then was extubated and returned to recovery room in stable condition.  HN/NUANCE  D:05/16/2020 T:05/16/2020 JOB:[id number]

## 2020-05-16 NOTE — Progress Notes (Signed)
PT Cancellation Note  Patient Details Name: Cody Arellano MRN: [id number] DOB: [birth year]   Cancelled Treatment:    Reason Eval/Treat Not Completed: (P) Patient at procedure or test/unavailable Pt is off floor for Impella removal. PT will follow back for treatment tomorrow.  Mayrene Bastarache B. Migdalia Dk PT, DPT Acute Rehabilitation Services Pager [phone] Office [phone]    [place] 05/16/2020, 11:29 AM

## 2020-05-16 NOTE — Anesthesia Procedure Notes (Signed)
Procedure Name: Intubation Date/Time: 05/16/2020 10:50 AM Performed by: Colin Benton, CRNA Pre-anesthesia Checklist: Patient identified, Emergency Drugs available, Suction available and Patient being monitored Patient Re-evaluated:Patient Re-evaluated prior to induction Oxygen Delivery Method: Circle System Utilized Preoxygenation: Pre-oxygenation with 100% oxygen Induction Type: IV induction Ventilation: Mask ventilation without difficulty Laryngoscope Size: Mac and 3 Grade View: Grade I Tube type: Oral Tube size: 7.5 mm Number of attempts: 1 Airway Equipment and Method: Stylet and Oral airway Placement Confirmation: ETT inserted through vocal cords under direct vision,  positive ETCO2 and breath sounds checked- equal and bilateral Secured at: 23 cm Tube secured with: Tape Dental Injury: Teeth and Oropharynx as per pre-operative assessment

## 2020-05-16 NOTE — Anesthesia Postprocedure Evaluation (Signed)
Anesthesia Post Note  Patient: Cody Arellano  Procedure(s) Performed: REMOVAL OF IMPELLA LEFT VENTRICULAR ASSIST DEVICE (N/A Chest)     Patient location during evaluation: SICU Anesthesia Type: General Level of consciousness: awake and alert Pain management: pain level controlled Vital Signs Assessment: post-procedure vital signs reviewed and stable Respiratory status: spontaneous breathing, nonlabored ventilation, respiratory function stable and patient connected to nasal cannula oxygen Cardiovascular status: blood pressure returned to baseline and stable Postop Assessment: no apparent nausea or vomiting Anesthetic complications: no   No complications documented.  Last Vitals:  Vitals:   05/16/20 1700 05/16/20 1800  BP:    Pulse: 61 (!) 58  Resp: 18 16  Temp: 37.4 C 37.5 C  SpO2: 99% 98%    Last Pain:  Vitals:   05/16/20 1900  TempSrc:   PainSc: 8                  Adriahna Shearman P Theone Bowell

## 2020-05-17 ENCOUNTER — Encounter (HOSPITAL_COMMUNITY): Payer: Self-pay | Admitting: Cardiothoracic Surgery

## 2020-05-17 LAB — COOXEMETRY PANEL
Carboxyhemoglobin: 2.2 % — ABNORMAL HIGH (ref 0.5–1.5)
Carboxyhemoglobin: 1.9 % — ABNORMAL HIGH (ref 0.5–1.5)
Methemoglobin: 0.9 % (ref 0.0–1.5)
Methemoglobin: 0.5 % (ref 0.0–1.5)
O2 Saturation: 70.8 %
O2 Saturation: 74.3 %
Total hemoglobin: 8.6 g/dL — ABNORMAL LOW (ref 12.0–16.0)
Total hemoglobin: 8 g/dL — ABNORMAL LOW (ref 12.0–16.0)

## 2020-05-17 LAB — BASIC METABOLIC PANEL
Anion gap: 9 (ref 5–15)
BUN: 19 mg/dL (ref 8–23)
CO2: 24 mmol/L (ref 22–32)
Calcium: 8.5 mg/dL — ABNORMAL LOW (ref 8.9–10.3)
Chloride: 101 mmol/L (ref 98–111)
Creatinine, Ser: 0.82 mg/dL (ref 0.61–1.24)
GFR, Estimated: >60 mL/min (ref >60)
Glucose, Bld: 103 mg/dL — ABNORMAL HIGH (ref 70–99)
Potassium: 4.9 mmol/L (ref 3.5–5.1)
Sodium: 134 mmol/L — ABNORMAL LOW (ref 135–145)

## 2020-05-17 LAB — CBC
HCT: 27.6 % — ABNORMAL LOW (ref 39.0–52.0)
Hemoglobin: 8.6 g/dL — ABNORMAL LOW (ref 13.0–17.0)
MCH: 28 pg (ref 26.0–34.0)
MCHC: 31.2 g/dL (ref 30.0–36.0)
MCV: 89.9 fL (ref 80.0–100.0)
Platelets: 269 10*3/uL (ref 150–400)
RBC: 3.07 MIL/uL — ABNORMAL LOW (ref 4.22–5.81)
RDW: 22.1 % — ABNORMAL HIGH (ref 11.5–15.5)
WBC: 8.5 10*3/uL (ref 4.0–10.5)
nRBC: 0 % (ref 0.0–0.2)

## 2020-05-17 LAB — GLUCOSE, CAPILLARY
Glucose-Capillary: 98 mg/dL (ref 70–99)
Glucose-Capillary: 79 mg/dL (ref 70–99)
Glucose-Capillary: 96 mg/dL (ref 70–99)
Glucose-Capillary: 107 mg/dL — ABNORMAL HIGH (ref 70–99)
Glucose-Capillary: 108 mg/dL — ABNORMAL HIGH (ref 70–99)

## 2020-05-17 LAB — LACTATE DEHYDROGENASE: LDH: 223 U/L — ABNORMAL HIGH (ref 98–192)

## 2020-05-17 MED ORDER — DAPAGLIFLOZIN PROPANEDIOL 10 MG PO TABS
10.0000 mg | ORAL_TABLET | Freq: Every day | ORAL | Status: DC
  Administered 2020-05-17 – 2020-05-23 (×7): 10 mg via ORAL
  Filled 2020-05-17 (×7): qty 1

## 2020-05-17 MED ORDER — LOSARTAN POTASSIUM 25 MG PO TABS
12.5000 mg | ORAL_TABLET | Freq: Two times a day (BID) | ORAL | Status: DC
  Administered 2020-05-17: 09:00:00 12.5 mg via ORAL
  Filled 2020-05-17 (×2): qty 1

## 2020-05-17 MED ORDER — NOREPINEPHRINE 4 MG/250ML-% IV SOLN
0.0000 ug/min | INTRAVENOUS | Status: DC
  Administered 2020-05-17: 21:00:00 2 ug/min via INTRAVENOUS
  Filled 2020-05-17: qty 250

## 2020-05-17 MED ORDER — ACETAMINOPHEN 325 MG PO TABS
650.0000 mg | ORAL_TABLET | ORAL | Status: DC | PRN
  Administered 2020-05-17 – 2020-05-20 (×2): 650 mg via ORAL
  Filled 2020-05-17 (×2): qty 2

## 2020-05-17 MED ORDER — SODIUM BICARBONATE 650 MG PO TABS
650.0000 mg | ORAL_TABLET | Freq: Once | ORAL | Status: DC
  Filled 2020-05-17: qty 1

## 2020-05-17 MED ORDER — ENSURE ENLIVE PO LIQD
237.0000 mL | Freq: Three times a day (TID) | ORAL | Status: DC
  Administered 2020-05-17 – 2020-05-23 (×17): 237 mL via ORAL

## 2020-05-17 MED ORDER — PANCRELIPASE (LIP-PROT-AMYL) 10440-39150 UNITS PO TABS
20880.0000 [IU] | ORAL_TABLET | Freq: Once | ORAL | Status: DC
  Filled 2020-05-17: qty 2

## 2020-05-17 MED ORDER — TORSEMIDE 20 MG PO TABS
20.0000 mg | ORAL_TABLET | Freq: Every day | ORAL | Status: DC
  Administered 2020-05-17 – 2020-05-22 (×6): 20 mg via ORAL
  Filled 2020-05-17 (×7): qty 1

## 2020-05-17 MED ORDER — PROSOURCE PLUS PO LIQD
30.0000 mL | Freq: Two times a day (BID) | ORAL | Status: DC
  Administered 2020-05-17 – 2020-05-23 (×10): 30 mL via ORAL
  Filled 2020-05-17 (×11): qty 30

## 2020-05-17 MED ORDER — SPIRONOLACTONE 25 MG PO TABS
25.0000 mg | ORAL_TABLET | Freq: Every day | ORAL | Status: DC
  Administered 2020-05-17: 09:00:00 25 mg via ORAL
  Filled 2020-05-17: qty 1

## 2020-05-17 NOTE — Progress Notes (Addendum)
Patient ID: Cody Arellano, male   DOB: 02/06/1946, 74 y.o.   MRN: [id number]     Advanced Heart Failure Rounding Note  PCP-Cardiologist: Mertie Moores, MD   Subjective:    Impella 5.5 placed on 11/30 with cardiogenic shock.   VT 12/7 early am had DCCV, converted back to afib.  Later went into a junctional rhythm and amiodarone stopped, then converted to NSR.     CXR with possible PNA right lung.  He has completed course of vancomycin/cefepime, now afebrile.   Platelets back to normal range.  HIT negative. He got 1u RBCs 12/4 and again 12/6. Hgb 8.6 today.   TEE-guided DCCV on 12/3. TEE with mildly dilated LV, EF 20%, mildly dilated RV with moderately decreased systolic function, moderate MR.  He remains in NSR today.   Impella extracted 12/13. Milrinone added post explant for marginal co-ox 51%.  Co-ox improved today at 71%. CVP 9. OOB sitting up in chair. No complaints.   He has been trying to eat more. Also w/ Cor-trak but currently clogged. No tube feeds overnight.   - Echo: EF 20-25% with septal akinesis, mid to apical inferior akinesis, apical anterior and apex akinesis.  Moderately dilated and dysfunctional RV.  Moderate TR.  Severe MR.  Bicuspid aortic valve with no stenosis, mild aortic insufficiency.  - LHC:  1. Dominant LCx, no significant disease in this vessel.  2. 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  The LIMA-LAD is also present.  3. The SVG-ramus has been occluded.  There is severe and diffuse in-stent restenosis in the proximal ramus.   Swan numbers: CVP 9 PA 44/9 CI 2.13 Co-ox 71%  Objective:   Weight Range: 71.3 kg Body mass index is 21.32 kg/m.   Vital Signs:   Temp:  [98.6 F (37 C)-99.68 F (37.6 C)] 98.78 F (37.1 C) (12/14 0600) Pulse Rate:  [51-61] 59 (12/14 0600) Resp:  [13-29] 20 (12/14 0600) SpO2:  [95 %-100 %] 97 % (12/14 0600) Arterial Line BP: (102-129)/(38-60) 116/50 (12/14 0600) Weight:  [71.3 kg] 71.3 kg (12/14  0600) Last BM Date: 05/15/20  Weight change: Filed Weights   05/15/20 0500 05/16/20 0700 05/17/20 0600  Weight: 65.6 kg 63.9 kg 71.3 kg    Intake/Output:   Intake/Output Summary (Last 24 hours) at 05/17/2020 0803 Last data filed at 05/17/2020 0700 Gross per 24 hour  Intake 1038.45 ml  Output 655 ml  Net 383.45 ml      Physical Exam    PHYSICAL EXAM: CVP 9  General:  Thin elderly AAM sitting up in chair. No respiratory difficulty HEENT: normal Neck: supple. + Rt IV Swan, JVD not well visualized. Carotids 2+ bilat; no bruits. No lymphadenopathy or thyromegaly appreciated. Cor: PMI nondisplaced. Regular rate & rhythm. No rubs, gallops or murmurs. Lungs: clear Abdomen: soft, nontender, nondistended. No hepatosplenomegaly. No bruits or masses. Good bowel sounds. Extremities: no cyanosis, clubbing, rash, edema Neuro: alert & oriented x 3, cranial nerves grossly intact. moves all 4 extremities w/o difficulty. Affect pleasant.     Telemetry    Sinus 50s-60s Personally reviewed   Labs    CBC Recent Labs    05/16/20 1428 05/17/20 0444  WBC 7.4 8.5  HGB 9.0* 8.6*  HCT 29.3* 27.6*  MCV 89.3 89.9  PLT 256 175   Basic Metabolic Panel Recent Labs    05/16/20 1428 05/17/20 0444  NA 135 134*  K 4.6 4.9  CL 102 101  CO2 24 24  GLUCOSE 102* 103*  BUN 17 19  CREATININE 0.75 0.82  CALCIUM 8.3* 8.5*   Liver Function Tests No results for input(s): AST, ALT, ALKPHOS, BILITOT, PROT, ALBUMIN in the last 72 hours. No results for input(s): LIPASE, AMYLASE in the last 72 hours. Cardiac Enzymes No results for input(s): CKTOTAL, CKMB, CKMBINDEX, TROPONINI in the last 72 hours.  BNP: BNP (last 3 results) Recent Labs    04/06/20 2134 04/26/20 1319 04/30/20 0248  BNP 1,677.6* 1,824.4* 1,685.5*    ProBNP (last 3 results) No results for input(s): PROBNP in the last 8760 hours.   D-Dimer No results for input(s): DDIMER in the last 72 hours. Hemoglobin A1C No  results for input(s): HGBA1C in the last 72 hours. Fasting Lipid Panel No results for input(s): CHOL, HDL, LDLCALC, TRIG, CHOLHDL, LDLDIRECT in the last 72 hours. Thyroid Function Tests No results for input(s): TSH, T4TOTAL, T3FREE, THYROIDAB in the last 72 hours.  Invalid input(s): FREET3  Other results:   Imaging    DG Chest Port 1 View  Result Date: 05/16/2020 CLINICAL DATA:  History of prior LVAD EXAM: PORTABLE CHEST 1 VIEW COMPARISON:  05/12/2020 FINDINGS: Cardiac shadow is enlarged. Postsurgical changes are again seen. Swan-Ganz catheter is noted in the pulmonary outflow tract. Feeding catheter is noted with the tip in the stomach although coiled upon itself within esophagus new from the prior exam. Previously seen Impella catheter has been removed. Right-sided PICC line remains in satisfactory position. Persistent central vascular congestion is noted. No focal infiltrate is seen. IMPRESSION: Interval removal of Impella device. Feeding catheter is coiled upon itself within the mid esophagus. Persistent vascular congestion without significant edema. Electronically Signed   By: Inez Catalina M.D.   On: 05/16/2020 14:29     Medications:     Scheduled Medications: . sodium chloride   Intravenous Once  . amiodarone  200 mg Oral BID  . apixaban  5 mg Oral BID  . aspirin  81 mg Oral Daily  . azaTHIOprine  100 mg Oral BID  . chlorhexidine  15 mL Mouth Rinse BID  . Chlorhexidine Gluconate Cloth  6 each Topical Daily  . digoxin  0.125 mg Oral Daily  . docusate  100 mg Oral BID  . docusate sodium  100 mg Oral BID  . dronabinol  2.5 mg Oral BID AC  . feeding supplement  237 mL Oral BID BM  . feeding supplement (PROSource TF)  45 mL Per Tube QID  . furosemide  40 mg Oral Daily  . hydroxychloroquine  400 mg Oral QHS  . losartan  12.5 mg Oral Daily  . mouth rinse  15 mL Mouth Rinse q12n4p  . multivitamin with minerals  1 tablet Oral Daily  . phenazopyridine  100 mg Oral TID WC  .  rosuvastatin  20 mg Oral Daily  . sodium chloride flush  10-40 mL Intracatheter Q12H  . sodium chloride flush  3 mL Intravenous Q12H  . spironolactone  12.5 mg Oral Daily  . tamsulosin  0.4 mg Oral Daily    Infusions: . sodium chloride Stopped (05/06/20 0858)  . sodium chloride    . sodium chloride 250 mL (05/16/20 1541)  . sodium chloride Stopped (05/12/20 0445)  . dextrose Stopped (05/06/20 2218)  . feeding supplement (VITAL 1.5 CAL) Stopped (05/16/20 2000)  . lactated ringers Stopped (05/11/20 0800)  . milrinone 0.125 mcg/kg/min (05/17/20 0700)    PRN Medications: Place/Maintain arterial line **AND** sodium chloride, sodium chloride, acetaminophen (TYLENOL) oral liquid 160  mg/5 mL, bisacodyl, fentaNYL (SUBLIMAZE) injection, lidocaine, ondansetron (ZOFRAN) IV, oxyCODONE, phenol, polyethylene glycol, Resource ThickenUp Clear, sodium chloride flush, sodium chloride flush, white petrolatum   Assessment/Plan   1. 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  Echo this admission looks worse than 10/21 with EF 20-25% with septal akinesis, mid to apical inferior akinesis, apical anterior and apex akinesis, moderately dilated and dysfunctional RV, moderate TR, severe MR, bicuspid aortic valve with no stenosis, mild aortic insufficiency. Progressive/end-stage CHF worsened by onset of atrial fibrillation. Cardiogenic shock 11/30 with co-ox down to 30%, Impella 5.5 placed to allow time to stabilize and consider further steps.  Impella extracted 12/13. 0.125 milrinone added post explant for marginal co-ox at 51%. Co-ox improved today at 71%. CI 2.13 - Increase Losartan to 12.5 mg bid - Continue Lasix 40 mg daily.     - Continue digoxin 0.125 - Increase spironolactone 25 daily.  - Remove Swan and follow CVPs and Co-ox through PICC - He has end-stage biventricular HF with cardiogenic shock physiology in the setting of AF/RVR and severe MR and advanced ischemic CM.  He  now s/p Impella 5.5 to support DC-CV and medical optimization.  Now off inotropes. Maintaining NSR. He is not a transplant candidate.  Barriers to LVAD include nutritional status/deconditioning and RV.  I think RV would be acceptable.  He is getting tube feeds and Ensure.  We discussed in [place], not LVAD candidate at this point with malnutrition/deconditioning.  Continue milrinone to allow time to improve nutritional status and mobility (hopefully to CIR).  - Palliative Care has seen and continue to discuss [place] 2. CAD: Prior history of MI with LAD and ramus PCI. Admitted May 2021 with anterior STEMI. LHC with occluded ostial LAD (in-stent), 90% ostial ramus, 60-70% proximal LCx, nondominant RCA. PTCA to LAD and ramus to restore flow, then CABG with LIMA-LAD and SVG-ramus.  No chest pain, doubt ACS.  Fall in EF from 10/21 to 11/21, but he did not present with ACS.  Reubens 12/1 showed patient LIMA-LAD, patent native LAD, patent dominant LCx.  The SVG-ramus is occluded with severe diffuse in-stent restenosis in proximal ramus.  Ramus not intervened upon as this would be a complex intervention and unlikely to markedly improve EF. No chest pain.  - Continue ASA 81 and statin.    3. AKI: Creatinine now trending down with improved cardiac output. Renal function normalized.   4. Right pleural effusion: S/p thoracentesis, transudative (CHF).  5. Atrial fibrillation/flutter with RVR: DCCV on 12/3, went back to NSR 12/7. Remains in NSR.  - Continue amiodarone at 200 mg bid.   - continue apixaban 5 mg bid  6. Mitral regurgitation:  TEE 12/3 with moderate central MR.  Doubt MitraClip will help him much with moderate MR and he is likely too advanced.  7. H/o PE: In 10/21.  Keep anticoagulated, on heparin gtt.  8. Bicuspid aortic valve: Mild AI, no AS.  9. SLE: H/o pericarditis.  On Imuran and hydroxychloroquine.  - Restarted home SLE regimen now that he is taking po.   10. Pulmonary nodules/emphysema: CCM following,  s/p bronch/BAL (no growth).  11. ID: Concern for right-sided PNA, ?aspiration.  Cultures NGTD.  Now afebrile and has completed course of vancomycin/cefepime.   12. Thrombocytopenia: Resolved.  May have been due to critical illness, low grade hemolysis with Impella. LDH ok.  HIT negative.  - Back on apixaban.  13. Hypokalemia/hypomag: Supplement as needed.  14. Acute blood loss anemia: No obvious site.  Looks like less swelling at Impella site.  Hgb 8.6 today.  - Transfuse hgb < 8.  15. VT: Episode early am 12/7, required DCCV.  No recurrence.  16. Dysuria: Negative UA.   - Restarted Flomax - Will use urinary tract analgesic.     Length of Stay: [address], PA-C  05/17/2020, 8:03 AM  Advanced Heart Failure Team Pager [phone] (M-F; 7a - 4p)  Please contact [facility] for night-coverage after hours (4p -7a ) and weekends on [email or web address]  Patient seen with PA, agree with the above note.   Impella now out, good co-ox 71% with CI 2.1 on milrinone 0.125. CVP 9 this morning (did not get Lasix yesterday).  He is eating breakfast this morning, Cortrack is clogged.   General: NAD Neck: JVP 8 cm, no thyromegaly or thyroid nodule.  Lungs: Clear to auscultation bilaterally with normal respiratory effort. CV: Lateral PMI.  Heart regular S1/S2, no S3/S4, 2/6 HSM apex.  No peripheral edema.   Abdomen: Soft, nontender, no hepatosplenomegaly, no distention.  Skin: Intact without lesions or rashes.  Neurologic: Alert and oriented x 3.  Psych: Normal affect. Extremities: No clubbing or cyanosis.  HEENT: Normal.   I think that he will need to stay on milrinone 0.125 for now.  Can remove Swan and introducer today, follow CVP and co-ox off PICC.  Increase losartan to 12.5 bid and increase spironolactone to 25 mg daily.  Continue digoxin 0.125.  Add dapagliflozin 10 mg daily. Would transition from Lasix to torsemide 20 mg daily.   Remains in NSR on amiodarone and apixaban.  Stop ASA  today.   Continue to push PT and nutrition.  Will try to get Cortrack working again.  Should be able to go to CIR by end of week.    Loralie Champagne 05/17/2020 8:36 AM

## 2020-05-17 NOTE — Progress Notes (Signed)
Spoke with MD on call regarding inability to unclog cortrak. Will pass along to have it evaluated in AM.

## 2020-05-17 NOTE — Progress Notes (Signed)
Physical Therapy Treatment Patient Details Name: Cody Arellano MRN: [id number] DOB: 10/22/1945 Today's Date: 05/17/2020    History of Present Illness Pt is a 74 y.o. male with medical history significant for lupus, interstitial lung disease, coronary artery disease, chronic systolic CHF, paroxysmal atrial fibrillation on Eliquis, and recent PE who presented to the emergency department due to shortness of breath that has been ongoing for about 2 weeks associated with nausea and intermittent nonbilious, nonbloody vomiting ~2-3 times per week. Pt had cardiogenic shock and impella placed 11/30; heart cath on 12/1. CXR 11/30: Marked worsening of airspace disease throughout the right chest most worrisome for pneumonia. CXR 12/1: Significant interval clearing of airspace opacity on the right as well as milder clearing on the left. No new opacity evident. CXR 12/2: Stable bibasilar subsegmental atelectasis. ETT 11/30-12/2.  Impella removed on 05/16/20.    PT Comments    Pt admitted with above diagnosis. Pt was able to ambulate but a shorter distance than previous walk as pt very fatigued this am.  Pt still requiring min to min guard assist for balance and needs cues for endurance training as he has decr awareness of when he is fatigued. Will continue acute PT.  Pt currently with functional limitations due to balance and endurance deficits. Pt will benefit from skilled PT to increase their independence and safety with mobility to allow discharge to the venue listed below.     Follow Up Recommendations  CIR;Supervision/Assistance - 24 hour     Equipment Recommendations  Rolling walker with 5" wheels;3in1 (PT)    Recommendations for Other Services       Precautions / Restrictions Precautions Precautions: Fall Precaution Comments: Primo fit, [place] Restrictions Weight Bearing Restrictions: No Other Position/Activity Restrictions: R impella (can utilize R arm for stabilizing RW, avoid pushing up)     Mobility  Bed Mobility Overal bed mobility: Needs Assistance Bed Mobility: Supine to Sit Rolling: Min assist Sidelying to sit: Min assist;+2 for physical assistance Supine to sit: HOB elevated;Min assist Sit to supine: Min assist;+2 for safety/equipment   General bed mobility comments: In chair on arrival. Assist back to bed after walk.  Pt needed assist for LEs back into bed  Transfers Overall transfer level: Needs assistance Equipment used: Rolling walker (2 wheeled) Transfers: Sit to/from [facility] Sit to Stand: +2 safety/equipment;Mod assist;From elevated surface Stand pivot transfers: +2 safety/equipment;Min assist       General transfer comment: modAx2 for use of pad to elevate hips, vc for use of L hand to push up on RW  Ambulation/Gait Ambulation/Gait assistance: Min assist;+2 safety/equipment Gait Distance (Feet): 160 Feet Assistive device: Rolling walker (2 wheeled) Gait Pattern/deviations: Drifts right/left;Decreased stride length;Narrow base of support;Step-through pattern Gait velocity: decreased Gait velocity interpretation: <1.31 ft/sec, indicative of household ambulator General Gait Details: Wife did chair follow but pt did not sit and rest, PT provided minA support across low back while holding [employer] in place. vc for sequencing especially with turning corners, and for increased base of support, pt requires 2-3x standing rest breaks dictated by therapy due to decreasing BoS likely due to hip weakness   Stairs             Wheelchair Mobility    Modified Rankin (Stroke Patients Only)       Balance Overall balance assessment: Needs assistance Sitting-balance support: Feet supported;No upper extremity supported Sitting balance-Leahy Scale: Fair Sitting balance - Comments: pt min guard for sitting EOB Postural control: Posterior lean  Standing balance support: Bilateral upper extremity supported;During functional  activity Standing balance-Leahy Scale: Poor Standing balance comment: relies on UE support from RW for balance and min assist +1                            Cognition Arousal/Alertness: Awake/alert Behavior During Therapy: WFL for tasks assessed/performed;Flat affect Overall Cognitive Status: Impaired/Different from baseline Area of Impairment: Safety/judgement                         Safety/Judgement: Decreased awareness of safety;Decreased awareness of deficits     General Comments: pt with decreased deficit and safety awareness, unable to gauge increasing weakness and take/request rest break independently      Exercises General Exercises - Upper Extremity Shoulder Flexion: AAROM;Left;10 reps Elbow Flexion: AROM;Both;10 reps Elbow Extension: AROM;Both;10 reps Wrist Flexion: AROM;Both;10 reps Wrist Extension: AROM;Both;10 reps Digit Composite Flexion: AROM;Both;10 reps Composite Extension: AROM;Both;10 reps General Exercises - Lower Extremity Ankle Circles/Pumps: AROM;Both;10 reps Quad Sets: AROM;Both;10 reps Short Arc Quad: AROM;Both;10 reps Long Arc Quad: AROM;Strengthening;Both;5 reps;Seated Heel Slides: AROM;Both;10 reps Hip ABduction/ADduction: AROM;Both;10 reps Straight Leg Raises: AROM;Strengthening;Both;5 reps;Seated Hip Flexion/Marching: AROM;Both;10 reps;Seated    General Comments General comments (skin integrity, edema, etc.): VSS on RA      Pertinent Vitals/Pain Pain Assessment: Faces Faces Pain Scale: Hurts a little bit Pain Location: R arm Pain Descriptors / Indicators: Grimacing;Guarding Pain Intervention(s): Limited activity within patient's tolerance;Monitored during session;Repositioned    Home Living                      Prior Function            PT Goals (current goals can now be found in the care plan section) Acute Rehab PT Goals Patient Stated Goal: Hoping to go home PT Goal Formulation: With patient Time  For Goal Achievement: 05/21/20 Potential to Achieve Goals: Good Progress towards PT goals: Progressing toward goals    Frequency    Min 3X/week      PT Plan Current plan remains appropriate    Co-evaluation PT/OT/SLP Co-Evaluation/Treatment: Yes Reason for Co-Treatment: Complexity of the patient's impairments (multi-system involvement);For patient/therapist safety PT goals addressed during session: Mobility/safety with mobility        AM-PAC PT "6 Clicks" Mobility   Outcome Measure  Help needed turning from your back to your side while in a flat bed without using bedrails?: A Little Help needed moving from lying on your back to sitting on the side of a flat bed without using bedrails?: A Little Help needed moving to and from a bed to a chair (including a wheelchair)?: A Little Help needed standing up from a chair using your arms (e.g., wheelchair or bedside chair)?: A Little Help needed to walk in hospital room?: A Little Help needed climbing 3-5 steps with a railing? : Total 6 Click Score: 16    End of Session Equipment Utilized During Treatment: Gait belt Activity Tolerance: Patient tolerated treatment well Patient left: with family/visitor present;with call bell/phone within reach;in bed Nurse Communication: Mobility status PT Visit Diagnosis: Muscle weakness (generalized) (M62.81);Difficulty in walking, not elsewhere classified (R26.2)     Time: 1010-1035 PT Time Calculation (min) (ACUTE ONLY): 25 min  Charges:  $Gait Training: 8-22 mins                     Sarepta W,PT [place] Pager:  [phone]  Office:  [place] 05/17/2020, 12:12 PM

## 2020-05-17 NOTE — Progress Notes (Signed)
Mr. Milles walked 295 ft without difficulty or distress.  Pt denied SOB, dizziness, or weakness during the walk.  After walking, he was placed in the chair and his next L arm SBP was in the 70s w/a MAP in the 50s.  BP was rechecked x 2 without change.  Pt was moved back to bed without incident.  Pt denied any SOB, dizziness, or weakness.    BP cuff was moved to his L leg.  His SBP was shown to be in the 80s w/a MAP in the 50s.    CHF was paged twice.  Cardiology was paged and PA Barrett called back.  She advised to keep pt supine and reviewed his Rx.  She didn't want to D/C milrinone and, d/t the CVP, she didn't feel fluid administration was appropriate.  (CVP was zero'd and stayed around 15 with pt supine).  She ordered a Co-ox once the pt had been supine for at least 30 min and she advised Dr. Jeffie Pollock would be to the unit shortly due to an admission.  She advised to seek further guidance from him.

## 2020-05-17 NOTE — Progress Notes (Signed)
1 Day Post-Op Procedure(s) (LRB): REMOVAL OF IMPELLA LEFT VENTRICULAR ASSIST DEVICE (N/A) Subjective: Sitting up eating breakfast Soreness in R shoulder Minimal JP drainage- Eliquis started CXR clear, coox 70%  Objective: Vital signs in last 24 hours: Temp:  [98.6 F (37 C)-99.68 F (37.6 C)] 98.78 F (37.1 C) (12/14 0600) Pulse Rate:  [51-61] 59 (12/14 0600) Cardiac Rhythm: Sinus bradycardia (12/13 2000) Resp:  [13-29] 20 (12/14 0600) SpO2:  [95 %-100 %] 97 % (12/14 0600) Arterial Line BP: (102-129)/(38-60) 116/50 (12/14 0600) Weight:  [71.3 kg] 71.3 kg (12/14 0600)  Hemodynamic parameters for last 24 hours: PAP: (43-63)/(9-20) 55/12 CVP:  [1 mmHg-15 mmHg] 10 mmHg PCWP:  [14 mmHg] 14 mmHg CO:  [3.9 L/min-4.4 L/min] 3.9 L/min CI:  [2.1 L/min/m2-2.4 L/min/m2] 2.1 L/min/m2  Intake/Output from previous day: 12/13 0701 - 12/14 0700 In: 1053.8 [P.O.:360; I.V.:648.2; IV Piggyback:10] Out: 655 [Urine:600; Drains:30; Blood:25] Intake/Output this shift: No intake/output data recorded.  Exam Lungs clear Surgical dressing dry   Lab Results: Recent Labs    05/16/20 1428 05/17/20 0444  WBC 7.4 8.5  HGB 9.0* 8.6*  HCT 29.3* 27.6*  PLT 256 269   BMET:  Recent Labs    05/16/20 1428 05/17/20 0444  NA 135 134*  K 4.6 4.9  CL 102 101  CO2 24 24  GLUCOSE 102* 103*  BUN 17 19  CREATININE 0.75 0.82  CALCIUM 8.3* 8.5*    PT/INR: No results for input(s): LABPROT, INR in the last 72 hours. ABG    Component Value Date/Time   PHART 7.427 05/16/2020 1059   HCO3 28.8 (H) 05/16/2020 1059   TCO2 30 05/16/2020 1059   ACIDBASEDEF 1.0 05/03/2020 1334   O2SAT 70.8 05/17/2020 0444   CBG (last 3)  Recent Labs    05/16/20 2353 05/17/20 0452 05/17/20 0641  GLUCAP 93 98 79    Assessment/Plan: S/P Procedure(s) (LRB): REMOVAL OF IMPELLA LEFT VENTRICULAR ASSIST DEVICE (N/A) Doing well- DC JP drain in 1-2 days   LOS: 21 days    Cody Arellano 05/17/2020

## 2020-05-17 NOTE — Progress Notes (Addendum)
Occupational Therapy Treatment Patient Details Name: Cody Arellano MRN: [id number] DOB: 1946/02/02 Today's Date: 05/17/2020    History of present illness Pt is a 74 y.o. male with medical history significant for lupus, interstitial lung disease, coronary artery disease, chronic systolic CHF, paroxysmal atrial fibrillation on Eliquis, and recent PE who presented to the emergency department due to shortness of breath that has been ongoing for about 2 weeks associated with nausea and intermittent nonbilious, nonbloody vomiting ~2-3 times per week. Pt had cardiogenic shock and impella placed 11/30; heart cath on 12/1. CXR 11/30: Marked worsening of airspace disease throughout the right chest most worrisome for pneumonia. CXR 12/1: Significant interval clearing of airspace opacity on the right as well as milder clearing on the left. No new opacity evident. CXR 12/2: Stable bibasilar subsegmental atelectasis. ETT 11/30-12/2.  Impella removed on 05/16/20.   OT comments  Pt progressing towards established OT goals. Pt reporting he feels more fatigued today compared to prior sessions. Pt agreeable to mobility in hallway and then return to bed. Pt requiring Mod A +2 for power up into standing and Min-Mod A +2 and RW for mobility. VSS throughout session on RA. Continue to recommend dc to CIR and will continue to follow acutely as admitted.    Follow Up Recommendations  CIR;Supervision/Assistance - 24 hour    Equipment Recommendations  3 in 1 bedside commode;Wheelchair (measurements OT);Wheelchair cushion (measurements OT)    Recommendations for Other Services PT consult    Precautions / Restrictions Precautions Precautions: Fall Precaution Comments: Primo fit, Gordy Councilman Restrictions Weight Bearing Restrictions: No       Mobility Bed Mobility Overal bed mobility: Needs Assistance Bed Mobility: Supine to Sit       Sit to supine: Min assist;+2 for safety/equipment   General bed mobility  comments: In chair on arrival. Assist back to bed after walk.  Pt needed assist for LEs back into bed  Transfers Overall transfer level: Needs assistance Equipment used: Rolling walker (2 wheeled) Transfers: Sit to/from [facility] Sit to Stand: +2 safety/equipment;Mod assist;From elevated surface         General transfer comment: modAx2 for use of pad to elevate hips, vc for use of L hand to push up on RW    Balance Overall balance assessment: Needs assistance Sitting-balance support: Feet supported;No upper extremity supported Sitting balance-Leahy Scale: Fair Sitting balance - Comments: pt min guard for sitting EOB Postural control: Posterior lean Standing balance support: Bilateral upper extremity supported;During functional activity Standing balance-Leahy Scale: Poor Standing balance comment: relies on UE support from RW for balance and min assist +1                           ADL either performed or assessed with clinical judgement   ADL Overall ADL's : Needs assistance/impaired                         Toilet Transfer: Moderate assistance;+2 for safety/equipment;+2 for physical assistance;Ambulation;RW (simualted to recliner) Toilet Transfer Details (indicate cue type and reason): Mod A +2 for power up and gain balance.         Functional mobility during ADLs: Minimal assistance;+2 for physical assistance;+2 for safety/equipment;Rolling walker;Moderate assistance General ADL Comments: Pt more fatigued today. Performing functional mobiltiy in hallway.     Vision       Quarry manager  Arousal/Alertness: Awake/alert Behavior During Therapy: WFL for tasks assessed/performed;Flat affect Overall Cognitive Status: Impaired/Different from baseline Area of Impairment: Safety/judgement                         Safety/Judgement: Decreased awareness of safety;Decreased awareness of deficits      General Comments: pt with decreased deficit and safety awareness, unable to gauge increasing weakness and take/request rest break independently        Exercises     Shoulder Instructions       General Comments VSS on RA. Wife present    Pertinent Vitals/ Pain       Pain Assessment: Faces Faces Pain Scale: Hurts a little bit Pain Location: R arm Pain Descriptors / Indicators: Grimacing;Guarding Pain Intervention(s): Monitored during session;Limited activity within patient's tolerance;Repositioned  Home Living                                          Prior Functioning/Environment              Frequency  Min 2X/week        Progress Toward Goals  OT Goals(current goals can now be found in the care plan section)  Progress towards OT goals: Progressing toward goals  Acute Rehab OT Goals Patient Stated Goal: Hoping to go home OT Goal Formulation: With patient Time For Goal Achievement: 05/20/20 Potential to Achieve Goals: Fair ADL Goals Pt Will Perform Grooming: with supervision;sitting Pt Will Perform Upper Body Bathing: with supervision;sitting Pt Will Perform Upper Body Dressing: with supervision;sitting Pt Will Transfer to Toilet: with min assist;stand pivot transfer;bedside commode  Plan Discharge plan remains appropriate    Co-evaluation    PT/OT/SLP Co-Evaluation/Treatment: Yes Reason for Co-Treatment: Complexity of the patient's impairments (multi-system involvement);To address functional/ADL transfers;For patient/therapist safety PT goals addressed during session: Mobility/safety with mobility OT goals addressed during session: ADL's and self-care      AM-PAC OT "6 Clicks" Daily Activity     Outcome Measure   Help from another person eating meals?: None Help from another person taking care of personal grooming?: A Little Help from another person toileting, which includes using toliet, bedpan, or urinal?: A Lot Help from another  person bathing (including washing, rinsing, drying)?: A Lot Help from another person to put on and taking off regular upper body clothing?: A Lot Help from another person to put on and taking off regular lower body clothing?: A Lot 6 Click Score: 15    End of Session    OT Visit Diagnosis: Other abnormalities of gait and mobility (R26.89);Muscle weakness (generalized) (M62.81);Pain Pain - Right/Left: Right Pain - part of body: Arm   Activity Tolerance Patient tolerated treatment well   Patient Left with call bell/phone within reach;with family/visitor present;in bed;with bed alarm set;with nursing/sitter in room   Nurse Communication Mobility status        Time: 4481-8563 OT Time Calculation (min): 23 min  Charges: OT General Charges $OT Visit: 1 Visit OT Treatments $Therapeutic Activity: 8-22 mins  [place], OTR/L Acute Rehab Pager: [phone] Office: [place] 05/17/2020, 3:10 PM

## 2020-05-17 NOTE — Progress Notes (Signed)
This chaplain is present with the Pt. and his wife-Terry for completing of the Pt. Advance Directive.  The chaplain understands the Pt. will complete his choices on the Living Will before the chaplain's return visit on Wednesday morning.

## 2020-05-17 NOTE — Progress Notes (Signed)
Inpatient Rehab Admissions:  Inpatient Rehab Consult received.  I met with patient and his wife at the bedside for rehabilitation assessment and to discuss goals and expectations of an inpatient rehab admission.  They are open to CIR, and would like more information on BC[employer] benefits, as [facility] does not contract with [facility] at this time.  Would like to see how pt mobilizes with therapy following impella removal to see if he still meets functional requirements.    Signed: Shann Medal, PT, DPT Admissions Coordinator [phone] 05/17/20  11:15 AM

## 2020-05-17 NOTE — Progress Notes (Signed)
Nutrition Follow-up  DOCUMENTATION CODES:   Underweight,Severe malnutrition in context of chronic illness  INTERVENTION:   -D/C Pivot 1.5 and ProSource TF -D/C calorie count   Increase Ensure Enlive po TID, each supplement provides 350 kcal and 20 grams of protein  Continue Magic cup TID with meals, each supplement provides 290 kcal and 9 grams of protein  Add 30 ml ProSource Plus BID, each supplement provides 100 kcals and 15 grams protein.   Continue MVI daily   NUTRITION DIAGNOSIS:   Severe Malnutrition related to chronic illness (CHF) as evidenced by severe muscle depletion,severe fat depletion,percent weight loss.  Ongoing  GOAL:   Patient will meet greater than or equal to 90% of their needs  Progressing PO  MONITOR:   PO intake,Supplement acceptance,Skin,Weight trends,Labs,I & O's  REASON FOR ASSESSMENT:   Consult Assessment of nutrition requirement/status,Poor PO  ASSESSMENT:   74 y.o. male with medical history significant for lupus, interstitial lung disease, coronary artery disease, chronic systolic CHF, paroxysmal atrial fibrillation presents with shortness of breath. Pt found to have large right-sided pleural effusion and small left-sided pleural effusion. Chest x-ray showed cardiomegaly with pulmonary venous congestion and bilateral interstitial prominence most consistent with interstitial edema.  11/26- s/p Flexible video fiberoptic bronchoscopy and biopsies 11/30- s/p impella insertion 12/07- DCCV, converted back to afib  12/13- s/p impella extraction   Pt discussed during ICU rounds and with RN.   Plan to remove JP in next 1-2 days. Cortrak clogged last night, did not receive nocturnal feedings. RD and RN unable to unclog, subsequently removed. Meal completions lack documentation. Last meal documented was 50% at dinner yesterday. Pt reports eating ~75% of eggs, grit, and potatoes this am. Consistently drinking two Ensures daily.  Trial increased  supplements, if intake does not progress recommend replacement of Cortrak. Pt aware and willing to try.   Admission weight: 66.7 kg  Current weight: 71.3 kg   UOP: 600 ml x 24 hrs  JP drain: 30 ml x 24 hrs    Medications: colace, marinol BID, viokace, aldactone, demadex Labs: Na 134 (L) CBG 79-96  Diet Order:   Diet Order            Diet regular Room service appropriate? Yes with Assist; Fluid consistency: Thin  Diet effective now                 EDUCATION NEEDS:   Education needs have been addressed  Skin:  Skin Assessment: Skin Integrity Issues: Skin Integrity Issues:: Incisions,Other (Comment) Incisions: R chest Other: Skin tear- L scrotum  Last BM:  12/12  Height:   Ht Readings from Last 1 Encounters:  04/29/20 6' (1.829 m)    Weight:   Wt Readings from Last 1 Encounters:  05/17/20 71.3 kg   BMI:  Body mass index is 21.32 kg/m.  Estimated Nutritional Needs:   Kcal:  2100-2300  Protein:  115-130 grams  Fluid:  > 2 L   Mariana Single RD, LDN Clinical Nutrition Pager listed in [place]

## 2020-05-18 LAB — GLUCOSE, CAPILLARY
Glucose-Capillary: 100 mg/dL — ABNORMAL HIGH (ref 70–99)
Glucose-Capillary: 113 mg/dL — ABNORMAL HIGH (ref 70–99)
Glucose-Capillary: 86 mg/dL (ref 70–99)
Glucose-Capillary: 108 mg/dL — ABNORMAL HIGH (ref 70–99)

## 2020-05-18 LAB — CBC
HCT: 28.4 % — ABNORMAL LOW (ref 39.0–52.0)
Hemoglobin: 8.9 g/dL — ABNORMAL LOW (ref 13.0–17.0)
MCH: 28.3 pg (ref 26.0–34.0)
MCHC: 31.3 g/dL (ref 30.0–36.0)
MCV: 90.4 fL (ref 80.0–100.0)
Platelets: 306 10*3/uL (ref 150–400)
RBC: 3.14 MIL/uL — ABNORMAL LOW (ref 4.22–5.81)
RDW: 21.3 % — ABNORMAL HIGH (ref 11.5–15.5)
WBC: 7.1 10*3/uL (ref 4.0–10.5)
nRBC: 0 % (ref 0.0–0.2)

## 2020-05-18 LAB — LACTATE DEHYDROGENASE: LDH: 174 U/L (ref 98–192)

## 2020-05-18 LAB — COOXEMETRY PANEL
Carboxyhemoglobin: 2 % — ABNORMAL HIGH (ref 0.5–1.5)
Methemoglobin: 0.9 % (ref 0.0–1.5)
O2 Saturation: 63.8 %
Total hemoglobin: 9 g/dL — ABNORMAL LOW (ref 12.0–16.0)

## 2020-05-18 LAB — BASIC METABOLIC PANEL
Anion gap: 9 (ref 5–15)
BUN: 27 mg/dL — ABNORMAL HIGH (ref 8–23)
CO2: 28 mmol/L (ref 22–32)
Calcium: 8.7 mg/dL — ABNORMAL LOW (ref 8.9–10.3)
Chloride: 99 mmol/L (ref 98–111)
Creatinine, Ser: 1.05 mg/dL (ref 0.61–1.24)
GFR, Estimated: >60 mL/min (ref >60)
Glucose, Bld: 117 mg/dL — ABNORMAL HIGH (ref 70–99)
Potassium: 4.3 mmol/L (ref 3.5–5.1)
Sodium: 136 mmol/L (ref 135–145)

## 2020-05-18 MED ORDER — MIDODRINE HCL 5 MG PO TABS
2.5000 mg | ORAL_TABLET | Freq: Three times a day (TID) | ORAL | Status: DC
  Administered 2020-05-18 – 2020-05-23 (×17): 2.5 mg via ORAL
  Filled 2020-05-18 (×17): qty 1

## 2020-05-18 NOTE — Plan of Care (Signed)

## 2020-05-18 NOTE — Progress Notes (Signed)
Patient ID: Cody Arellano, male   DOB: Dec 31, 1945, 74 y.o.   MRN: [id number]     Advanced Heart Failure Rounding Note  PCP-Cardiologist: Mertie Moores, MD   Subjective:    Impella 5.5 placed on 11/30 with cardiogenic shock. Impella removed 12/13.   VT 12/7 early am had DCCV, converted back to afib.  Later went into a junctional rhythm and amiodarone stopped, then converted to NSR.   TEE-guided DCCV on 12/3. TEE with mildly dilated LV, EF 20%, mildly dilated RV with moderately decreased systolic function, moderate MR.  He remains in NSR today.   CXR with possible PNA right lung.  He has completed course of vancomycin/cefepime, now afebrile.   Platelets back to normal range.  HIT negative. He got 1u RBCs 12/4 and again 12/6.   Impella extracted 12/13. Milrinone added post explant for marginal co-ox 51%.  Co-ox stable today 64% with CVP 8-9.  Overnight, BP dropped and NE 5 added.  Remains on milrinone 0.125.  SBP 90s-100s now, no complaints.   He has been trying to eat more. Cortrak out.  Getting Boost.   - Echo: EF 20-25% with septal akinesis, mid to apical inferior akinesis, apical anterior and apex akinesis.  Moderately dilated and dysfunctional RV.  Moderate TR.  Severe MR.  Bicuspid aortic valve with no stenosis, mild aortic insufficiency.  - LHC:  1. Dominant LCx, no significant disease in this vessel.  2. 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  The LIMA-LAD is also present.  3. The SVG-ramus has been occluded.  There is severe and diffuse in-stent restenosis in the proximal ramus.   Objective:   Weight Range: 60.3 kg Body mass index is 18.03 kg/m.   Vital Signs:   Temp:  [97.7 F (36.5 C)-98.96 F (37.2 C)] 97.7 F (36.5 C) (12/15 0700) Pulse Rate:  [51-68] 60 (12/15 0752) Resp:  [12-23] 16 (12/15 0752) BP: (66-121)/(27-89) 117/63 (12/15 0700) SpO2:  [90 %-100 %] 97 % (12/15 0752) Arterial Line BP: (114-124)/(44-47) 114/44 (12/14 1100) Weight:   [60.3 kg] 60.3 kg (12/15 0645) Last BM Date: 05/15/20  Weight change: Filed Weights   05/16/20 0700 05/17/20 0600 05/18/20 0645  Weight: 63.9 kg 71.3 kg 60.3 kg    Intake/Output:   Intake/Output Summary (Last 24 hours) at 05/18/2020 0820 Last data filed at 05/18/2020 0700 Gross per 24 hour  Intake 590.64 ml  Output 2700 ml  Net -2109.36 ml      Physical Exam    General: NAD, thin Neck: No JVD, no thyromegaly or thyroid nodule.  Lungs: Clear to auscultation bilaterally with normal respiratory effort. CV: Nondisplaced PMI.  Heart regular S1/S2, no S3/S4, 2/6 HSM apex.  No peripheral edema.  Abdomen: Soft, nontender, no hepatosplenomegaly, no distention.  Skin: Intact without lesions or rashes.  Neurologic: Alert and oriented x 3.  Psych: Normal affect. Extremities: No clubbing or cyanosis.  HEENT: Normal.    Telemetry    Sinus 50s-60s Personally reviewed   Labs    CBC Recent Labs    05/16/20 1428 05/17/20 0444  WBC 7.4 8.5  HGB 9.0* 8.6*  HCT 29.3* 27.6*  MCV 89.3 89.9  PLT 256 355   Basic Metabolic Panel Recent Labs    05/17/20 0444 05/18/20 0609  NA 134* 136  K 4.9 4.3  CL 101 99  CO2 24 28  GLUCOSE 103* 117*  BUN 19 27*  CREATININE 0.82 1.05  CALCIUM 8.5* 8.7*   Liver Function Tests No  results for input(s): AST, ALT, ALKPHOS, BILITOT, PROT, ALBUMIN in the last 72 hours. No results for input(s): LIPASE, AMYLASE in the last 72 hours. Cardiac Enzymes No results for input(s): CKTOTAL, CKMB, CKMBINDEX, TROPONINI in the last 72 hours.  BNP: BNP (last 3 results) Recent Labs    04/06/20 2134 04/26/20 1319 04/30/20 0248  BNP 1,677.6* 1,824.4* 1,685.5*    ProBNP (last 3 results) No results for input(s): PROBNP in the last 8760 hours.   D-Dimer No results for input(s): DDIMER in the last 72 hours. Hemoglobin A1C No results for input(s): HGBA1C in the last 72 hours. Fasting Lipid Panel No results for input(s): CHOL, HDL, LDLCALC, TRIG,  CHOLHDL, LDLDIRECT in the last 72 hours. Thyroid Function Tests No results for input(s): TSH, T4TOTAL, T3FREE, THYROIDAB in the last 72 hours.  Invalid input(s): FREET3  Other results:   Imaging    No results found.   Medications:     Scheduled Medications: . (feeding supplement) PROSource Plus  30 mL Oral BID BM  . sodium chloride   Intravenous Once  . amiodarone  200 mg Oral BID  . apixaban  5 mg Oral BID  . azaTHIOprine  100 mg Oral BID  . Chlorhexidine Gluconate Cloth  6 each Topical Daily  . dapagliflozin propanediol  10 mg Oral Daily  . digoxin  0.125 mg Oral Daily  . docusate  100 mg Oral BID  . docusate sodium  100 mg Oral BID  . dronabinol  2.5 mg Oral BID AC  . feeding supplement  237 mL Oral TID BM  . hydroxychloroquine  400 mg Oral QHS  . lipase/protease/amylase)  20,880 Units Per Tube Once   And  . sodium bicarbonate  650 mg Per Tube Once  . midodrine  2.5 mg Oral TID WC  . multivitamin with minerals  1 tablet Oral Daily  . rosuvastatin  20 mg Oral Daily  . sodium chloride flush  10-40 mL Intracatheter Q12H  . sodium chloride flush  3 mL Intravenous Q12H  . tamsulosin  0.4 mg Oral Daily  . torsemide  20 mg Oral Daily    Infusions: . sodium chloride Stopped (05/06/20 0858)  . sodium chloride    . sodium chloride 250 mL (05/16/20 1541)  . sodium chloride Stopped (05/12/20 0445)  . dextrose Stopped (05/06/20 2218)  . lactated ringers Stopped (05/11/20 0800)  . milrinone 0.125 mcg/kg/min (05/18/20 0700)  . norepinephrine (LEVOPHED) Adult infusion 5 mcg/min (05/18/20 0700)    PRN Medications: [CANCELED] Place/Maintain arterial line **AND** sodium chloride, sodium chloride, acetaminophen, bisacodyl, fentaNYL (SUBLIMAZE) injection, lidocaine, ondansetron (ZOFRAN) IV, oxyCODONE, phenol, polyethylene glycol, Resource ThickenUp Clear, sodium chloride flush, sodium chloride flush, white petrolatum   Assessment/Plan   1. Acute on chronic systolic  CHF/cardiogenic shock: Ischemic cardiomyopathy.  Echo this admission looks worse than 10/21 with EF 20-25% with septal akinesis, mid to apical inferior akinesis, apical anterior and apex akinesis, moderately dilated and dysfunctional RV, moderate TR, severe MR, bicuspid aortic valve with no stenosis, mild aortic insufficiency. Progressive/end-stage CHF worsened by onset of atrial fibrillation. Cardiogenic shock 11/30 with co-ox down to 30%, Impella 5.5 placed to allow time to stabilize and consider further steps.  Impella extracted 12/13. 0.125 milrinone added post explant for marginal co-ox at 51%. Co-ox improved today at 64%. Patient now on NE 5 with hypotension overnight.  - Stop losartan and spironolactone with low BP.  - Start midodrine 2.5 mg tid and wean off NE today.  - Continue digoxin 0.125  -  He has end-stage biventricular HF with cardiogenic shock physiology in the setting of AF/RVR and severe MR and advanced ischemic CM.  He now s/p Impella 5.5 to support DC-CV and medical optimization. Maintaining NSR. He is not a transplant candidate.  Barriers to LVAD include nutritional status/deconditioning and RV.  I think RV would be acceptable.  Nutritional status improving.  We discussed in [place], not LVAD candidate at this point with malnutrition/deconditioning.  Continue milrinone to allow time to improve nutritional status and mobility (hopefully to CIR).  - Palliative Care has seen and continue to discuss [place] 2. CAD: Prior history of MI with LAD and ramus PCI. Admitted May 2021 with anterior STEMI. LHC with occluded ostial LAD (in-stent), 90% ostial ramus, 60-70% proximal LCx, nondominant RCA. PTCA to LAD and ramus to restore flow, then CABG with LIMA-LAD and SVG-ramus.  No chest pain, doubt ACS.  Fall in EF from 10/21 to 11/21, but he did not present with ACS.  [place] 12/1 showed patient LIMA-LAD, patent native LAD, patent dominant LCx.  The SVG-ramus is occluded with severe diffuse in-stent restenosis  in proximal ramus.  Ramus not intervened upon as this would be a complex intervention and unlikely to markedly improve EF. No chest pain.  - Continue ASA 81 and statin.    3. AKI: Creatinine now trending down with improved cardiac output. Renal function normalized.   4. Right pleural effusion: S/p thoracentesis, transudative (CHF).  5. Atrial fibrillation/flutter with RVR: DCCV on 12/3, went back to NSR 12/7. Remains in NSR.  - Continue amiodarone at 200 mg bid.   - continue apixaban 5 mg bid  6. Mitral regurgitation:  TEE 12/3 with moderate central MR.  Doubt MitraClip will help him much with moderate MR and he is likely too advanced.  7. H/o PE: In 10/21.  Keep anticoagulated, on apixaban.  8. Bicuspid aortic valve: Mild AI, no AS.  9. SLE: H/o pericarditis.  On Imuran and hydroxychloroquine.  - Restarted home SLE regimen now that he is taking po.   10. Pulmonary nodules/emphysema: CCM following, s/p bronch/BAL (no growth).  11. ID: Concern for right-sided PNA, ?aspiration.  Cultures NGTD.  Now afebrile and has completed course of vancomycin/cefepime.   12. Thrombocytopenia: Resolved.  May have been due to critical illness, low grade hemolysis with Impella. LDH ok.  HIT negative.  - Back on apixaban.  13. Hypokalemia/hypomag: Supplement as needed.  14. Acute blood loss anemia: No obvious site.   - Transfuse hgb < 8.  15. VT: Episode early am 12/7, required DCCV.  No recurrence.   Wean off NE today.  Will ask CIR to see him again, hopefully could go soon.    Length of Stay: [place], MD  05/18/2020, 8:20 AM  Advanced Heart Failure Team Pager [phone] (M-F; 7a - 4p)  Please contact [facility] for night-coverage after hours (4p -7a ) and weekends on [email or web address]

## 2020-05-18 NOTE — Progress Notes (Signed)
This chaplain is present with the Pt., notary, and witnesses for notarizing of the Pt. Advance Directive: HCPOA and Living Will.  The Pt. named Cody Arellano as his healthcare agent. Cody Arellano is the Pt. next choice, if the Pt. healthcare agent is unable or unwilling to serve.  This chaplain gave the Pt. the original Advance Directive and two copies.  The chaplain scanned the Pt. Advance Directive into EMR.  This chaplain is available for F/U spiritual care as needed.

## 2020-05-18 NOTE — Progress Notes (Signed)
Inpatient Rehab Admissions Coordinator:   Note pt has Medicare Part A primary to [facility].  No prior auth for CIR required with medicare part A.  Will continue to follow for potential admit pending bed availability and medical readiness.  Updated pt and son at bedside.   Shann Medal, PT, DPT Admissions Coordinator [phone] 05/18/20  4:35 PM

## 2020-05-18 NOTE — Progress Notes (Signed)
RT note. Pt. Found on Rm air VS stable at this time, RT will continue to monitor.

## 2020-05-19 ENCOUNTER — Telehealth (HOSPITAL_COMMUNITY): Payer: Self-pay | Admitting: Pharmacy Technician

## 2020-05-19 LAB — BASIC METABOLIC PANEL
Anion gap: 10 (ref 5–15)
BUN: 26 mg/dL — ABNORMAL HIGH (ref 8–23)
CO2: 29 mmol/L (ref 22–32)
Calcium: 8.4 mg/dL — ABNORMAL LOW (ref 8.9–10.3)
Chloride: 99 mmol/L (ref 98–111)
Creatinine, Ser: 1.03 mg/dL (ref 0.61–1.24)
GFR, Estimated: >60 mL/min (ref >60)
Glucose, Bld: 120 mg/dL — ABNORMAL HIGH (ref 70–99)
Potassium: 3.9 mmol/L (ref 3.5–5.1)
Sodium: 138 mmol/L (ref 135–145)

## 2020-05-19 LAB — CBC
HCT: 28.2 % — ABNORMAL LOW (ref 39.0–52.0)
Hemoglobin: 8.5 g/dL — ABNORMAL LOW (ref 13.0–17.0)
MCH: 27.7 pg (ref 26.0–34.0)
MCHC: 30.1 g/dL (ref 30.0–36.0)
MCV: 91.9 fL (ref 80.0–100.0)
Platelets: 272 10*3/uL (ref 150–400)
RBC: 3.07 MIL/uL — ABNORMAL LOW (ref 4.22–5.81)
RDW: 21.1 % — ABNORMAL HIGH (ref 11.5–15.5)
WBC: 5.8 10*3/uL (ref 4.0–10.5)
nRBC: 0 % (ref 0.0–0.2)

## 2020-05-19 LAB — GLUCOSE, CAPILLARY
Glucose-Capillary: 79 mg/dL (ref 70–99)
Glucose-Capillary: 90 mg/dL (ref 70–99)
Glucose-Capillary: 93 mg/dL (ref 70–99)
Glucose-Capillary: 141 mg/dL — ABNORMAL HIGH (ref 70–99)

## 2020-05-19 LAB — COOXEMETRY PANEL
Carboxyhemoglobin: 2 % — ABNORMAL HIGH (ref 0.5–1.5)
Methemoglobin: 0.8 % (ref 0.0–1.5)
O2 Saturation: 67 %
Total hemoglobin: 8.8 g/dL — ABNORMAL LOW (ref 12.0–16.0)

## 2020-05-19 LAB — LACTATE DEHYDROGENASE: LDH: 156 U/L (ref 98–192)

## 2020-05-19 NOTE — Telephone Encounter (Signed)
Patient Advocate Encounter   Received notification from [place] that prior authorization for [place] is required.   PA submitted on CoverMyMeds Key [id number] Status is pending   Will continue to follow.

## 2020-05-19 NOTE — Progress Notes (Addendum)
Patient ID: Cody Arellano, male   DOB: July 01, 1945, 74 y.o.   MRN: [id number]     Advanced Heart Failure Rounding Note  PCP-Cardiologist: Mertie Moores, MD   Subjective:    Impella 5.5 placed on 11/30 with cardiogenic shock. Impella removed 12/13.   VT 12/7 early am had DCCV, converted back to afib.  Later went into a junctional rhythm and amiodarone stopped, then converted to NSR.   TEE-guided DCCV on 12/3. TEE with mildly dilated LV, EF 20%, mildly dilated RV with moderately decreased systolic function, moderate MR.  He remains in NSR today.   CXR with possible PNA right lung.  He has completed course of vancomycin/cefepime, now afebrile.   Platelets back to normal range.  HIT negative. He got 1u RBCs 12/4 and again 12/6.   Impella extracted 12/13. Milrinone added post explant for marginal co-ox 51%. Briefly required NE for low BP. Losartan and spiro stopped.   Now off NE. Remains on Milrinone 0.125. Co-ox stable today 67% with CVP 6-7.  BP stable on midodrine, systolic's 357S-177L. Wt stable. SCr and K WNL.   OOB sitting up in chair, eating breakfast. Appetite improving. Notes right hip pain. No other complaints.   - Echo: EF 20-25% with septal akinesis, mid to apical inferior akinesis, apical anterior and apex akinesis.  Moderately dilated and dysfunctional RV.  Moderate TR.  Severe MR.  Bicuspid aortic valve with no stenosis, mild aortic insufficiency.  - LHC:  1. Dominant LCx, no significant disease in this vessel.  2. 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  The LIMA-LAD is also present.  3. The SVG-ramus has been occluded.  There is severe and diffuse in-stent restenosis in the proximal ramus.   Objective:   Weight Range: 60.3 kg Body mass index is 18.03 kg/m.   Vital Signs:   Temp:  [97.5 F (36.4 C)-98.4 F (36.9 C)] 98.4 F (36.9 C) (12/16 0430) Pulse Rate:  [58-71] 58 (12/16 0700) Resp:  [10-24] 14 (12/16 0700) BP: (94-120)/(48-84) 108/57 (12/16  0700) SpO2:  [90 %-100 %] 100 % (12/16 0700) Weight:  [60.3 kg] 60.3 kg (12/16 0600) Last BM Date: 05/15/20  Weight change: Filed Weights   05/17/20 0600 05/18/20 0645 05/19/20 0600  Weight: 71.3 kg 60.3 kg 60.3 kg    Intake/Output:   Intake/Output Summary (Last 24 hours) at 05/19/2020 0717 Last data filed at 05/19/2020 0600 Gross per 24 hour  Intake 350.18 ml  Output 1895 ml  Net -1544.82 ml      Physical Exam    PHYSICAL EXAM: General:  Well appearing, thin AAM, sitting up in chair. No respiratory difficulty HEENT: normal Neck: supple. JVD 7 cm. Carotids 2+ bilat; no bruits. No lymphadenopathy or thyromegaly appreciated. Cor: PMI nondisplaced. Regular rate & rhythm. No rubs, gallops or murmurs. Lungs: clear Abdomen: soft, nontender, nondistended. No hepatosplenomegaly. No bruits or masses. Good bowel sounds. Extremities: no cyanosis, clubbing, rash, edema Neuro: alert & oriented x 3, cranial nerves grossly intact. moves all 4 extremities w/o difficulty. Affect pleasant.   Telemetry    Sinus 60s Personally reviewed   Labs    CBC Recent Labs    05/18/20 0822 05/19/20 0411  WBC 7.1 5.8  HGB 8.9* 8.5*  HCT 28.4* 28.2*  MCV 90.4 91.9  PLT 306 390   Basic Metabolic Panel Recent Labs    05/18/20 0609 05/19/20 0411  NA 136 138  K 4.3 3.9  CL 99 99  CO2 28 29  GLUCOSE  117* 120*  BUN 27* 26*  CREATININE 1.05 1.03  CALCIUM 8.7* 8.4*   Liver Function Tests No results for input(s): AST, ALT, ALKPHOS, BILITOT, PROT, ALBUMIN in the last 72 hours. No results for input(s): LIPASE, AMYLASE in the last 72 hours. Cardiac Enzymes No results for input(s): CKTOTAL, CKMB, CKMBINDEX, TROPONINI in the last 72 hours.  BNP: BNP (last 3 results) Recent Labs    04/06/20 2134 04/26/20 1319 04/30/20 0248  BNP 1,677.6* 1,824.4* 1,685.5*    ProBNP (last 3 results) No results for input(s): PROBNP in the last 8760 hours.   D-Dimer No results for input(s): DDIMER  in the last 72 hours. Hemoglobin A1C No results for input(s): HGBA1C in the last 72 hours. Fasting Lipid Panel No results for input(s): CHOL, HDL, LDLCALC, TRIG, CHOLHDL, LDLDIRECT in the last 72 hours. Thyroid Function Tests No results for input(s): TSH, T4TOTAL, T3FREE, THYROIDAB in the last 72 hours.  Invalid input(s): FREET3  Other results:   Imaging    No results found.   Medications:     Scheduled Medications: . (feeding supplement) PROSource Plus  30 mL Oral BID BM  . sodium chloride   Intravenous Once  . amiodarone  200 mg Oral BID  . apixaban  5 mg Oral BID  . azaTHIOprine  100 mg Oral BID  . Chlorhexidine Gluconate Cloth  6 each Topical Daily  . dapagliflozin propanediol  10 mg Oral Daily  . digoxin  0.125 mg Oral Daily  . docusate  100 mg Oral BID  . docusate sodium  100 mg Oral BID  . dronabinol  2.5 mg Oral BID AC  . feeding supplement  237 mL Oral TID BM  . hydroxychloroquine  400 mg Oral QHS  . lipase/protease/amylase)  20,880 Units Per Tube Once   And  . sodium bicarbonate  650 mg Per Tube Once  . midodrine  2.5 mg Oral TID WC  . multivitamin with minerals  1 tablet Oral Daily  . rosuvastatin  20 mg Oral Daily  . sodium chloride flush  10-40 mL Intracatheter Q12H  . sodium chloride flush  3 mL Intravenous Q12H  . tamsulosin  0.4 mg Oral Daily  . torsemide  20 mg Oral Daily    Infusions: . sodium chloride Stopped (05/06/20 0858)  . sodium chloride    . sodium chloride 250 mL (05/16/20 1541)  . sodium chloride Stopped (05/12/20 0445)  . dextrose Stopped (05/06/20 2218)  . lactated ringers Stopped (05/11/20 0800)  . milrinone 0.125 mcg/kg/min (05/19/20 0600)  . norepinephrine (LEVOPHED) Adult infusion Stopped (05/18/20 1125)    PRN Medications: [CANCELED] Place/Maintain arterial line **AND** sodium chloride, sodium chloride, acetaminophen, bisacodyl, fentaNYL (SUBLIMAZE) injection, lidocaine, ondansetron (ZOFRAN) IV, oxyCODONE, phenol,  polyethylene glycol, Resource ThickenUp Clear, sodium chloride flush, sodium chloride flush, white petrolatum   Assessment/Plan   1. 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  Echo this admission looks worse than 10/21 with EF 20-25% with septal akinesis, mid to apical inferior akinesis, apical anterior and apex akinesis, moderately dilated and dysfunctional RV, moderate TR, severe MR, bicuspid aortic valve with no stenosis, mild aortic insufficiency. Progressive/end-stage CHF worsened by onset of atrial fibrillation. Cardiogenic shock 11/30 with co-ox down to 30%, Impella 5.5 placed to allow time to stabilize and consider further steps.  Impella extracted 12/13. 0.125 milrinone added post explant for marginal co-ox at 51%. Co-ox improved today at 67%. Off NE, SBPs stable on midodrine.  - continue milrinone 0.125 - Off losartan and  spironolactone with low BP.  - Continue midodrine 2.5 mg tid for BP support - Continue digoxin 0.125  - Continue Farxiga 10 mg  - Continue Torsemide 20 mg  - He has end-stage biventricular HF with cardiogenic shock physiology in the setting of AF/RVR and severe MR and advanced ischemic CM.  He now s/p Impella 5.5 to support DC-CV and medical optimization. Maintaining NSR. He is not a transplant candidate.  Barriers to LVAD include nutritional status/deconditioning and RV.  I think RV would be acceptable.  Nutritional status improving.  We discussed in [place], not LVAD candidate at this point with malnutrition/deconditioning.  Continue milrinone to allow time to improve nutritional status and mobility (hopefully to CIR).  - Palliative Care has seen and continue to discuss [place] 2. CAD: Prior history of MI with LAD and ramus PCI. Admitted May 2021 with anterior STEMI. LHC with occluded ostial LAD (in-stent), 90% ostial ramus, 60-70% proximal LCx, nondominant RCA. PTCA to LAD and ramus to restore flow, then CABG with LIMA-LAD and SVG-ramus.  No  chest pain, doubt ACS.  Fall in EF from 10/21 to 11/21, but he did not present with ACS.  [place] 12/1 showed patient LIMA-LAD, patent native LAD, patent dominant LCx.  The SVG-ramus is occluded with severe diffuse in-stent restenosis in proximal ramus.  Ramus not intervened upon as this would be a complex intervention and unlikely to markedly improve EF. No chest pain.  - Continue ASA 81 and statin.    3. AKI: Creatinine now trending down with improved cardiac output. Renal function normalized.   4. Right pleural effusion: S/p thoracentesis, transudative (CHF).  5. Atrial fibrillation/flutter with RVR: DCCV on 12/3, went back to NSR 12/7. Remains in NSR.  - Continue amiodarone at 200 mg bid.   - continue apixaban 5 mg bid  6. Mitral regurgitation:  TEE 12/3 with moderate central MR.  Doubt MitraClip will help him much with moderate MR and he is likely too advanced.  7. H/o PE: In 10/21.  Keep anticoagulated, on apixaban.  8. Bicuspid aortic valve: Mild AI, no AS.  9. SLE: H/o pericarditis.  On Imuran and hydroxychloroquine.  - Restarted home SLE regimen now that he is taking po.   10. Pulmonary nodules/emphysema: CCM following, s/p bronch/BAL (no growth).  11. ID: Concern for right-sided PNA, ?aspiration.  Cultures NGTD.  Now afebrile and has completed course of vancomycin/cefepime.   12. Thrombocytopenia: Resolved.  May have been due to critical illness, low grade hemolysis with Impella. LDH ok.  HIT negative.  - Back on apixaban.  13. Hypokalemia/hypomag: Supplement as needed.  14. Acute blood loss anemia: No obvious site.   - Transfuse hgb < 8.  15. VT: Episode early am 12/7, required DCCV.  No recurrence.  16. Deconditioning: - continue to mobilize  - CIR following, hopefully could go soon.    Length of Stay: [address] PA-C  05/19/2020, 7:17 AM  Advanced Heart Failure Team Pager [phone] (M-F; [place])  Please contact [facility] for night-coverage after hours (4p -7a  ) and weekends on [email or web address]  Patient seen with PA, agree with the above note.   Stable today, CVP 6-7 with co-ox 67% on milrinone 0.125. He remains in NSR.   General: NAD Neck: No JVD, no thyromegaly or thyroid nodule.  Lungs: Clear to auscultation bilaterally with normal respiratory effort. CV: Lateral PMI.  Heart regular S1/S2, no S3/S4, 2/6 HSM LLSB/apex.  No peripheral edema.   Abdomen: Soft, nontender,  no hepatosplenomegaly, no distention.  Skin: Intact without lesions or rashes.  Neurologic: Alert and oriented x 3.  Psych: Normal affect. Extremities: No clubbing or cyanosis.  HEENT: Normal.   Volume status stable, continue current torsemide.  Continue milrinone 0.125, good co-ox.  Continue midodrine, Farxiga, and digoxin.   Eating more, walking in hall.  I think he is ready for CIR when there is an available bed .  Cody Arellano 05/19/2020 9:42 AM

## 2020-05-19 NOTE — Progress Notes (Signed)
Occupational Therapy Treatment Patient Details Name: Cody Arellano MRN: [id number] DOB: [birth year] Today's Date: 05/19/2020    History of present illness Pt is a 74 y.o. male with medical history significant for lupus, interstitial lung disease, coronary artery disease, chronic systolic CHF, paroxysmal atrial fibrillation on Eliquis, and recent PE who presented to the emergency department due to shortness of breath that has been ongoing for about 2 weeks associated with nausea and intermittent nonbilious, nonbloody vomiting ~2-3 times per week. Pt had cardiogenic shock and impella placed 11/30; heart cath on 12/1. CXR 11/30: Marked worsening of airspace disease throughout the right chest most worrisome for pneumonia. CXR 12/1: Significant interval clearing of airspace opacity on the right as well as milder clearing on the left. No new opacity evident. CXR 12/2: Stable bibasilar subsegmental atelectasis. ETT 11/30-12/2.  Impella removed on 05/16/20.   OT comments  Patient up in recliner.  Patient stating MD is looking to move him to 4E.  Patient continues with the deficits listed below, but is demonstrating improved mobility, activity tolerance and less discomfort to his R shoulder.  Patient continues to need OT in the acute setting, he continues with significant declines to his functional status compared to his prior level of function.  Goals will be updated to reflect a possible move to 4E.  CIR has been recommended for post acute rehab.    Follow Up Recommendations  CIR;Supervision/Assistance - 24 hour    Equipment Recommendations  3 in 1 bedside commode;Wheelchair (measurements OT);Wheelchair cushion (measurements OT)    Recommendations for Other Services      Precautions / Restrictions Precautions Precautions: Fall Restrictions Weight Bearing Restrictions: No          Balance   Sitting-balance support: Feet supported;No upper extremity supported Sitting balance-Leahy Scale: Fair                                      ADL either performed or assessed with clinical judgement   ADL       Grooming: Wash/dry hands;Wash/dry face;Set up;Sitting;Oral care Grooming Details (indicate cue type and reason): Items placed outside reach to encourage leaning forward and increase trunk strength.             Lower Body Dressing: Maximal assistance;Sitting/lateral leans Lower Body Dressing Details (indicate cue type and reason): socks placed to mid foot, patient encouraged to bring feet closer to him while leaning forward to comfort.               General ADL Comments: Patient noted to walk 300' this am with staff.                                                                       Exercises General Exercises - Upper Extremity Shoulder Flexion: AAROM;Both;10 reps Shoulder Extension: AAROM;Both;10 reps Other Exercises Other Exercises: lateral raise to eye level: 1 set and 10 reps AA Other Exercises: arm circles forward and back: 1 set and 10 reps each AA   Shoulder Instructions       General Comments  BP 110/56.  HR 65    Pertinent Vitals/ Pain  Faces Pain Scale: Hurts a little bit Pain Location: R arm Pain Descriptors / Indicators: Grimacing;Guarding Pain Intervention(s): Monitored during session                                                          Frequency  Min 2X/week        Progress Toward Goals  OT Goals(current goals can now be found in the care plan section)  Progress towards OT goals: Progressing toward goals  Acute Rehab OT Goals Patient Stated Goal: get stronger and move better OT Goal Formulation: With patient Time For Goal Achievement: 06/03/20 Potential to Achieve Goals: Bath Discharge plan remains appropriate    Co-evaluation                 AM-PAC OT "6 Clicks" Daily Activity     Outcome Measure   Help from another person eating  meals?: None Help from another person taking care of personal grooming?: A Little Help from another person toileting, which includes using toliet, bedpan, or urinal?: A Lot Help from another person bathing (including washing, rinsing, drying)?: A Lot Help from another person to put on and taking off regular upper body clothing?: A Lot Help from another person to put on and taking off regular lower body clothing?: A Lot 6 Click Score: 15    End of Session    OT Visit Diagnosis: Other abnormalities of gait and mobility (R26.89);Muscle weakness (generalized) (M62.81);Pain Pain - Right/Left: Right Pain - part of body: Arm   Activity Tolerance Patient tolerated treatment well   Patient Left in chair;with call bell/phone within reach   Nurse Communication          Time: 6803-2122 OT Time Calculation (min): 19 min  Charges: OT General Charges $OT Visit: 1 Visit OT Treatments $Self Care/Home Management : 8-22 mins  05/19/2020  Cody Arellano, OTR/L  Acute Rehabilitation Services  Office:  [phone]    Metta Clines 05/19/2020, 10:10 AM

## 2020-05-19 NOTE — Progress Notes (Signed)
Physical Therapy Treatment Patient Details Name: Cody Arellano MRN: [id number] DOB: November 18, 1945 Today's Date: 05/19/2020    History of Present Illness Pt is a 74 y.o. male with medical history significant for lupus, interstitial lung disease, coronary artery disease, chronic systolic CHF, paroxysmal atrial fibrillation on Eliquis, and recent PE who presented to the emergency department due to shortness of breath that has been ongoing for about 2 weeks associated with nausea and intermittent nonbilious, nonbloody vomiting ~2-3 times per week. Pt had cardiogenic shock and impella placed 11/30; heart cath on 12/1. CXR 11/30: Marked worsening of airspace disease throughout the right chest most worrisome for pneumonia. CXR 12/1: Significant interval clearing of airspace opacity on the right as well as milder clearing on the left. No new opacity evident. CXR 12/2: Stable bibasilar subsegmental atelectasis. ETT 11/30-12/2.  Impella removed on 05/16/20.    PT Comments    Pt admitted with above diagnosis. Pt progressing and increasing distance daily.  Pt met 2/6 goals.  Goals revised.  Will continue to follow acutely and progress pt as able.   Pt currently with functional limitations due to balance and endurance deficits. Pt will benefit from skilled PT to increase their independence and safety with mobility to allow discharge to the venue listed below.     Follow Up Recommendations  CIR;Supervision/Assistance - 24 hour     Equipment Recommendations  Rolling walker with 5" wheels;3in1 (PT)    Recommendations for Other Services       Precautions / Restrictions Precautions Precautions: Fall Precaution Comments: Primo fit Restrictions Weight Bearing Restrictions: No    Mobility  Bed Mobility Overal bed mobility: Needs Assistance             General bed mobility comments: In chair on arrival.  Transfers Overall transfer level: Needs assistance Equipment used: Rolling walker (2  wheeled) Transfers: Sit to/from Stand Sit to Stand: Min assist         General transfer comment: min assist to power up  Ambulation/Gait Ambulation/Gait assistance: Min assist;Min guard Gait Distance (Feet): 490 Feet Assistive device: Rolling walker (2 wheeled) Gait Pattern/deviations: Drifts right/left;Decreased stride length;Narrow base of support;Step-through pattern Gait velocity: decreased Gait velocity interpretation: <1.31 ft/sec, indicative of household ambulator General Gait Details: vc for sequencing especially with turning corners, and for increased base of support, pt requires 2-3x standing rest breaks dictated by therapy due to decreasing BoS likely due to hip weakness.  Pt progressing daily.   Stairs             Wheelchair Mobility    Modified Rankin (Stroke Patients Only)       Balance Overall balance assessment: Needs assistance Sitting-balance support: Feet supported;No upper extremity supported Sitting balance-Leahy Scale: Fair     Standing balance support: Bilateral upper extremity supported;During functional activity Standing balance-Leahy Scale: Poor Standing balance comment: relies on UE support from RW for balance and min assist +1                            Cognition Arousal/Alertness: Awake/alert Behavior During Therapy: WFL for tasks assessed/performed;Flat affect Overall Cognitive Status: Impaired/Different from baseline Area of Impairment: Safety/judgement                         Safety/Judgement: Decreased awareness of safety;Decreased awareness of deficits     General Comments: pt with decreased deficit and safety awareness, unable to gauge increasing weakness and  take/request rest break independently      Exercises General Exercises - Upper Extremity Shoulder Flexion: AAROM;Both;10 reps Shoulder Extension: AAROM;Both;10 reps General Exercises - Lower Extremity Ankle Circles/Pumps: AROM;Both;10  reps Long Arc Quad: AROM;Strengthening;Both;5 reps;Seated Hip Flexion/Marching: AROM;Both;10 reps;Seated Other Exercises Other Exercises: lateral raise to eye level: 1 set and 10 reps AA Other Exercises: arm circles forward and back: 1 set and 10 reps each AA    General Comments General comments (skin integrity, edema, etc.): VSS on RA      Pertinent Vitals/Pain Pain Assessment: Faces Faces Pain Scale: Hurts a little bit Pain Location: R arm Pain Descriptors / Indicators: Grimacing;Guarding Pain Intervention(s): Limited activity within patient's tolerance;Monitored during session;Repositioned    Home Living                      Prior Function            PT Goals (current goals can now be found in the care plan section) Acute Rehab PT Goals Patient Stated Goal: get stronger and move better PT Goal Formulation: With patient Time For Goal Achievement: 06/02/20 Potential to Achieve Goals: Good Progress towards PT goals: Progressing toward goals    Frequency    Min 3X/week      PT Plan Current plan remains appropriate    Co-evaluation              AM-PAC PT "6 Clicks" Mobility   Outcome Measure  Help needed turning from your back to your side while in a flat bed without using bedrails?: A Little Help needed moving from lying on your back to sitting on the side of a flat bed without using bedrails?: A Little Help needed moving to and from a bed to a chair (including a wheelchair)?: A Little Help needed standing up from a chair using your arms (e.g., wheelchair or bedside chair)?: A Little Help needed to walk in hospital room?: A Little Help needed climbing 3-5 steps with a railing? : Total 6 Click Score: 16    End of Session Equipment Utilized During Treatment: Gait belt Activity Tolerance: Patient tolerated treatment well Patient left: with call bell/phone within reach;in chair Nurse Communication: Mobility status PT Visit Diagnosis: Muscle  weakness (generalized) (M62.81);Difficulty in walking, not elsewhere classified (R26.2)     Time: 4604-7998 PT Time Calculation (min) (ACUTE ONLY): 24 min  Charges:  $Gait Training: 23-37 mins                     Tykesha Konicki W,PT [place] Pager:  [phone]  Office:  [place] 05/19/2020, 1:13 PM

## 2020-05-19 NOTE — Progress Notes (Signed)
Inpatient Rehab Admissions Coordinator:   Spoke to [facility], PA-C, and pt to d/c on milrinone infusion.  Otherwise medically stable.  I do not have a bed for pt to admit today, however will follow closely for possible admission in the next 1-2 days pending bed availability.   Shann Medal, PT, DPT Admissions Coordinator [phone] 05/19/20  11:57 AM

## 2020-05-19 NOTE — Anesthesia Postprocedure Evaluation (Signed)
Anesthesia Post Note  Patient: Cody Arellano  Procedure(s) Performed: PLACEMENT OF IMPELLA 5.5 LEFT VENTRICULAR ASSIST DEVICE VIA RIGHT AXILLARY (Right Chest) TRANSESOPHAGEAL ECHOCARDIOGRAM (TEE) (N/A )     Patient location during evaluation: SICU Anesthesia Type: General Level of consciousness: sedated and patient remains intubated per anesthesia plan Pain management: pain level controlled Vital Signs Assessment: post-procedure vital signs reviewed and stable Respiratory status: patient remains intubated per anesthesia plan and patient on ventilator - see flowsheet for VS Cardiovascular status: stable Postop Assessment: no apparent nausea or vomiting Anesthetic complications: no   No complications documented.  Last Vitals:  Vitals:   05/19/20 1856 05/19/20 1900  BP: 102/61 109/71  Pulse: 60 65  Resp: (!) 21 18  Temp:    SpO2: 97% 99%    Last Pain:  Vitals:   05/19/20 1600  TempSrc:   PainSc: 0-No pain                 [name] COKER

## 2020-05-19 NOTE — Progress Notes (Signed)
Patient walked 295 ft without difficulties. Patient denies any discomfort. After walking, vital signs are stable. He is up in chair at this time. Will continue to assess.

## 2020-05-19 NOTE — Discharge Summary (Addendum)
Advanced Heart Failure Team  Discharge Summary   Patient ID: Cody Arellano MRN: [id number], DOB/AGE: [birth year] 74 y.o. Admit date: 04/26/2020 D/C date:     05/23/2020   Primary Discharge Diagnoses:  Acute on Chronic Biventricular Systolic Heart Failure=>Cardiogenic Shock Requiring Mechanical Support and Inotropes  AKI Atrial Fibrillation/ Flutter VT Requiring DCCV CAD w/ h/o CABG Mitral Regurgitation RLL PNA Rt Pleural Effusion s/p thoracentesis  Pulmonary Nodules/ Emphysema  Acute Blood Loss Anemia Requiring Transfusion  Malnutrition  Deconditioning   Pertinent PMH: 74 y/o male with complicated PMHx including SLE, COPD, CAD s/p previous PCIs.    First admitted earlier this year in 5/21 with anterior STEMI found to have thrombotic occlusion of ostial LAD stent + 90% ISR in ostial ramus with EF 25-30%. L dominant system.  Underwent PTCA of LAD lesion with reduction 100% -> 50% however unable to stent ramus due to protrusion of the previous ramus stents into LM. IABP placed and taken for emergent CABG with LIMA -> LAD and SVG to RI (Dr. Roxy Manns)  Echo in 5/21 EF 45-50% (25% at cath). Post-op course c/b PAF and started on amio (which was eventually stopped).   Has had several admissions since that time. In 10/21 admitted with SOB and CT scan showed possible bilateral lower lobe PE. LV to RV ratio was normal so not felt to have acute RV strain but note made of contrast reflux into the hepatic veins suggesting right heart failure.  Hospital Course:   Admitted 04/26/20 with worsening HF symptoms and failure to thrive. Underwent CT chest with poor opacification of bilateral LL pulmonary arteries. Large right effusion, persistent volume overload/HF and increasing pulmonary nodules. Underwent thoracentesis and bronch/biopsy.   Echo obtained and EF read as 30-35% (felt closer to 20-25% on MD review with moderate RV dysfunction and severe MR, mod-severe TE). Progressed to cardiogenic shock. PICC  placed. Initial Co-ox 41%. Started on milrinone and increased to 0.375. Developed AFL with RVR. Started on IV amio. Ultimately required mechanical support. Implella 5.5 placed on 11/30. Hemodynamics and renal function improved. Impella extracted 12/13. Milrinone restarted post extraction for marginal co-ox. Developed hypotension and losartan and spiro discontinued. Midodrine added to support BP. Other medical problems addressed this admit included RLL PNA treated w/ abx and VT requiring DCCV.    Regarding long term options for heart failure, he is not a transplant candidate. Barriers to LVAD include nutritional status/deconditioning. We discussed in [facility] and he is not a LVAD candidate at this point with malnutrition/deconditioning.  Plan for now is to continue milrinone to allow time to improve nutritional status and mobility. Pt's appetite/ PO intake improving. He has been evaluated by CIR and felt to qualify.   On 05/2020, he was seen and examined by Dr. Haroldine Laws and felt medically stable for transfer to CIR, where he will continue on milrinone. Plan is to also d/c home on milrinone to bridge until potential LVAD.    Discharge Weight Range: 131 lb (59 kg) Discharge Vitals: Blood pressure 111/66, pulse (!) 58, temperature 98 F (36.7 C), temperature source Oral, resp. rate 19, height 6' (1.829 m), weight 59.6 kg, SpO2 100 %.   Labs: Lab Results  Component Value Date   WBC 5.7 05/23/2020   HGB 8.9 (L) 05/23/2020   HCT 31.0 (L) 05/23/2020   MCV 93.7 05/23/2020   PLT 310 05/23/2020    Recent Labs  Lab 05/23/20 0330  NA 140  K 4.2  CL 99  CO2 31  BUN  27*  CREATININE 1.04  CALCIUM 8.3*  GLUCOSE 97   Lab Results  Component Value Date   CHOL 113 10/17/2019   HDL 45 10/17/2019   LDLCALC 58 10/17/2019   TRIG 49 05/06/2020   BNP (last 3 results) Recent Labs    04/06/20 2134 04/26/20 1319 04/30/20 0248  BNP 1,677.6* 1,824.4* 1,685.5*    ProBNP (last 3 results) No results for  input(s): PROBNP in the last 8760 hours.   Diagnostic Studies/Procedures   No results found.  Discharge Medications   Allergies as of 05/23/2020   No Known Allergies     Medication List    STOP taking these medications   furosemide 40 MG tablet Commonly known as: LASIX     TAKE these medications   (feeding supplement) PROSource Plus liquid Take 30 mLs by mouth 2 (two) times daily between meals.   feeding supplement Liqd Take 237 mLs by mouth 3 (three) times daily between meals.   amiodarone 200 MG tablet Commonly known as: PACERONE Take 1 tablet (200 mg total) by mouth daily.   apixaban 5 MG Tabs tablet Commonly known as: ELIQUIS Take 1 tablet (5 mg total) by mouth 2 (two) times daily.   Artificial Tears 1.4 % ophthalmic solution Generic drug: polyvinyl alcohol Place 2 drops into both eyes daily.   aspirin EC 81 MG tablet Take 1 tablet (81 mg total) by mouth daily.   azaTHIOprine 50 MG tablet Commonly known as: IMURAN Take 100 mg by mouth 2 (two) times daily.   bisacodyl 10 MG suppository Commonly known as: DULCOLAX Place 1 suppository (10 mg total) rectally daily as needed for moderate constipation (If miralax is ineffective for moderate constipation).   dapagliflozin propanediol 10 MG Tabs tablet Commonly known as: FARXIGA Take 1 tablet (10 mg total) by mouth daily. Start taking on: May 24, 2020   digoxin 0.125 MG tablet Commonly known as: LANOXIN Take 1 tablet (0.125 mg total) by mouth daily. Start taking on: May 24, 2020   docusate 50 MG/5ML liquid Commonly known as: COLACE Take 10 mLs (100 mg total) by mouth 2 (two) times daily.   docusate sodium 100 MG capsule Commonly known as: COLACE Take 1 capsule (100 mg total) by mouth 2 (two) times daily.   dronabinol 2.5 MG capsule Commonly known as: MARINOL Take 1 capsule (2.5 mg total) by mouth 2 (two) times daily before lunch and supper.   hydroxychloroquine 200 MG tablet Commonly  known as: PLAQUENIL Take 400 mg by mouth at bedtime.   midodrine 2.5 MG tablet Commonly known as: PROAMATINE Take 1 tablet (2.5 mg total) by mouth 3 (three) times daily with meals.   milrinone 20 MG/100 ML Soln infusion Commonly known as: PRIMACOR Inject 0.008 mg/min into the vein continuous.   multivitamin with minerals Tabs tablet Take 1 tablet by mouth daily. Start taking on: May 24, 2020   ondansetron 4 MG/2ML Soln injection Commonly known as: ZOFRAN Inject 2 mLs (4 mg total) into the vein every 6 (six) hours as needed for nausea.   oxyCODONE 5 MG immediate release tablet Commonly known as: Oxy IR/ROXICODONE Take 1 tablet (5 mg total) by mouth every 6 (six) hours as needed for severe pain.   polyethylene glycol 17 g packet Commonly known as: MIRALAX / GLYCOLAX Take 17 g by mouth daily as needed for moderate constipation.   rosuvastatin 20 MG tablet Commonly known as: CRESTOR Take 20 mg by mouth daily.   spironolactone 25 MG tablet Commonly known  as: ALDACTONE Take 0.5 tablets (12.5 mg total) by mouth daily. Start taking on: May 24, 2020   tamsulosin 0.4 MG Caps capsule Commonly known as: FLOMAX Take 0.4 mg by mouth every evening.   torsemide 20 MG tablet Commonly known as: DEMADEX Take 1 tablet (20 mg total) by mouth daily.       Disposition   The patient will be discharged to CIR in stable condition    Follow-up Information    Icard, Bradley L, DO In 6 weeks.   Specialty: Pulmonary Disease Contact information: [place] [address] [phone]                 Duration of Discharge Encounter: Greater than 35 minutes   Signed, Nelida Gores  05/23/2020, 10:58 AM  Patient seen and examined with the above-signed Advanced Practice Provider and/or Housestaff. I personally reviewed laboratory data, imaging studies and relevant notes. I independently examined the patient and formulated the important  aspects of the plan. I have edited the note to reflect any of my changes or salient points. I have personally discussed the plan with the patient and/or family.  He is ready for discharge to CIR. See progress note from this am for more details. Continue milrinone. We will follow while in CIR.   Glori Bickers, MD  6:35 PM

## 2020-05-20 LAB — CBC
HCT: 30.1 % — ABNORMAL LOW (ref 39.0–52.0)
Hemoglobin: 8.7 g/dL — ABNORMAL LOW (ref 13.0–17.0)
MCH: 27.1 pg (ref 26.0–34.0)
MCHC: 28.9 g/dL — ABNORMAL LOW (ref 30.0–36.0)
MCV: 93.8 fL (ref 80.0–100.0)
Platelets: 305 10*3/uL (ref 150–400)
RBC: 3.21 MIL/uL — ABNORMAL LOW (ref 4.22–5.81)
RDW: 20.8 % — ABNORMAL HIGH (ref 11.5–15.5)
WBC: 5.1 10*3/uL (ref 4.0–10.5)
nRBC: 0 % (ref 0.0–0.2)

## 2020-05-20 LAB — TYPE AND SCREEN
ABO/RH(D): A POS
Antibody Screen: NEGATIVE
Unit division: 0
Unit division: 0

## 2020-05-20 LAB — ECHO INTRAOPERATIVE TEE
AR max vel: 1.1 cm2
AV Area VTI: 0.1 cm2
AV Area mean vel: 1.07 cm2
AV Mean grad: 4 mmHg
AV Peak grad: 9.5 mmHg
Ao pk vel: 1.54 m/s
Area-P 1/2: 5.97 cm2
Height: 72 in
S' Lateral: 5.02 cm
Weight: 2253.98 oz

## 2020-05-20 LAB — BASIC METABOLIC PANEL
Anion gap: 11 (ref 5–15)
BUN: 23 mg/dL (ref 8–23)
CO2: 30 mmol/L (ref 22–32)
Calcium: 8.4 mg/dL — ABNORMAL LOW (ref 8.9–10.3)
Chloride: 99 mmol/L (ref 98–111)
Creatinine, Ser: 0.91 mg/dL (ref 0.61–1.24)
GFR, Estimated: >60 mL/min (ref >60)
Glucose, Bld: 99 mg/dL (ref 70–99)
Potassium: 3.7 mmol/L (ref 3.5–5.1)
Sodium: 140 mmol/L (ref 135–145)

## 2020-05-20 LAB — GLUCOSE, CAPILLARY
Glucose-Capillary: 88 mg/dL (ref 70–99)
Glucose-Capillary: 102 mg/dL — ABNORMAL HIGH (ref 70–99)
Glucose-Capillary: 118 mg/dL — ABNORMAL HIGH (ref 70–99)
Glucose-Capillary: 118 mg/dL — ABNORMAL HIGH (ref 70–99)

## 2020-05-20 LAB — BPAM RBC
Blood Product Expiration Date: 202201082359
Blood Product Expiration Date: 202201082359
Unit Type and Rh: 6200
Unit Type and Rh: 6200

## 2020-05-20 LAB — COOXEMETRY PANEL
Carboxyhemoglobin: 1.9 % — ABNORMAL HIGH (ref 0.5–1.5)
Methemoglobin: 0.9 % (ref 0.0–1.5)
O2 Saturation: 58.4 %
Total hemoglobin: 8.8 g/dL — ABNORMAL LOW (ref 12.0–16.0)

## 2020-05-20 LAB — LACTATE DEHYDROGENASE: LDH: 171 U/L (ref 98–192)

## 2020-05-20 MED ORDER — SPIRONOLACTONE 12.5 MG HALF TABLET
12.5000 mg | ORAL_TABLET | Freq: Every day | ORAL | Status: DC
  Administered 2020-05-20 – 2020-05-23 (×4): 12.5 mg via ORAL
  Filled 2020-05-20 (×4): qty 1

## 2020-05-20 MED ORDER — POTASSIUM CHLORIDE CRYS ER 20 MEQ PO TBCR
40.0000 meq | EXTENDED_RELEASE_TABLET | Freq: Once | ORAL | Status: AC
  Administered 2020-05-20: 09:00:00 40 meq via ORAL
  Filled 2020-05-20: qty 2

## 2020-05-20 NOTE — Progress Notes (Signed)
Nutrition Follow-up  DOCUMENTATION CODES:   Underweight,Severe malnutrition in context of chronic illness  INTERVENTION:   - Recommend continuing without nocturnal tube feeds at this time; will continue to monitor PO intake and assess for need for nutrition support  - Continue Ensure Enlive po TID, each supplement provides 350 kcal and 20 grams of protein  - Continue Magic Cup TID with meals, each supplement provides 290 kcal and 9 grams of protein  - Continue ProSource Plus 30 ml po BID, each supplement provides 100 kcal and 15 grams of protein  - Continue MVI with minerals daily  - Continue to encourage PO intake at meals  NUTRITION DIAGNOSIS:   Severe Malnutrition related to chronic illness (CHF) as evidenced by severe muscle depletion,severe fat depletion,percent weight loss.  Ongoing  GOAL:   Patient will meet greater than or equal to 90% of their needs  Progressing  MONITOR:   PO intake,Supplement acceptance,Skin,Weight trends,Labs,I & O's  REASON FOR ASSESSMENT:   Consult Assessment of nutrition requirement/status,Poor PO  ASSESSMENT:   74 y.o. male with medical history significant for lupus, interstitial lung disease, coronary artery disease, chronic systolic CHF, paroxysmal atrial fibrillation presents with shortness of breath. Pt found to have large right-sided pleural effusion and small left-sided pleural effusion. Chest x-ray showed cardiomegaly with pulmonary venous congestion and bilateral interstitial prominence most consistent with interstitial edema.  11/26 - s/p flexible video fiberoptic bronchoscopy and biopsies 11/30 - s/p impella insertion 12/07 - DCCV, converted back to afib  12/13 - s/p impella extraction 12/14 - Cortrak removed  Discussed pt with RN. Pt awaiting CIR bed.  Spoke with pt at bedside. Pt had consumed ~90% of a Nepro supplement (per RN, unit was out of strawberry Ensure). Pt requesting chocolate Ensure in place of strawberry as  he does not like the taste of the Nepro. RN provided pt with a chocolate Ensure during RD visit. Pt states that he plans to drink this next.  Noted breakfast meal tray in room. Pt had consumed 100% of the bacon, 100% of the fruit, 100% of the frosted flakes cereal (no milk), 50% of the vanilla Magic Cup, 100% of the juice, and a few bites of the eggs. Estimate pt consumed 473 kcal and 11 grams of protein at breakfast today in addition to approximately 380 kcal and 17 grams of protein from Nepro supplement. Pt states that he is "working up the courage" to take the ProSource Plus on his tray table which would be an additional 100 kcal and 15 grams of protein.  Pt states that yesterday he consumed 2 Ensure supplements and 1 ProSource Plus supplement (total of 800 kcal and 53 grams of protein from supplements alone).  Recommend continuing without nocturnal tube feeds at this time. Have communicated recommendations with MD.  Admission weight: 66.7 kg  Current weight: 60.1 kg  Meal Completion: 25-75%  Medications reviewed and include: ProSource Plus BID, farxiga, colace, marinol, Ensure Enlive TID, MVI with minerals, spironolactone, torsemide, milrinone  Labs reviewed: hemoglobin 8.7 CBG's: 88-141 x 24 hours  UOP: 2200 ml x 24 hours I/O's: -6.4 L since admit  Diet Order:   Diet Order            Diet regular Room service appropriate? Yes with Assist; Fluid consistency: Thin  Diet effective now                 EDUCATION NEEDS:   Education needs have been addressed  Skin:  Skin Assessment: Skin Integrity Issues:  Incisions: right chest Other: skin tear to left scrotum  Last BM:  05/19/20  Height:   Ht Readings from Last 1 Encounters:  04/29/20 6' (1.829 m)    Weight:   Wt Readings from Last 1 Encounters:  05/20/20 60.1 kg    BMI:  Body mass index is 17.97 kg/m.  Estimated Nutritional Needs:   Kcal:  2100-2300  Protein:  115-130 grams  Fluid:  > 2 L    Gustavus Bryant, MS, RD, LDN Inpatient Clinical Dietitian Please see [facility] for contact information.

## 2020-05-20 NOTE — PMR Pre-admission (Signed)
PMR Admission Coordinator Pre-Admission Assessment   Patient: Cody Arellano is an 74 y.o., male MRN: [id number] DOB: 11/23/1945 Height: 6' (182.9 cm) Weight: 59.6 kg   Insurance Information HMO:    PPO:      PCP:      IPA:      80/20:      OTHER:  PRIMARY: Medicare A      Policy#: [id number]      Subscriber: pt CM Name:       Phone#:      Fax#:  Pre-Cert#: verified online      Employer: pt Benefits:  Phone #:      Name:  Eff. Date: 10/03/10 part A only     Deduct: $1484      Out of Pocket Max: n/a      Life Max: n/a CIR: 100%      SNF: 20 full days Outpatient:      Co-Pay:  Home Health:       Co-Pay:  DME:      Co-Pay:  Providers:  SECONDARY: VA and BCBS Federal      Policy#:      Phone#:    Financial Counselor:       Phone#:    The "Data Collection Information Summary" for patients in Inpatient Rehabilitation Facilities with attached "Privacy Act Statement-Health Care Records" was provided and verbally reviewed with: Patient   Emergency Contact Information         Contact Information     Name Relation Home Work Mobile    Siegel, Terry Spouse [phone]   [phone]    Goodwill, Brian Son [phone]   [phone]         Current Medical History  Patient Admitting Diagnosis: debility    History of Present Illness: Cody Arellano is a 74-year-old right-handed male with history significant for lupus diagnosed in 2014 maintained on Imuran as well as Plaquenil followed by Dr. Angela Hawkes, interstitial lung disease, CAD/CABG May 2021, chronic systolic congestive heart failure, PAF on Eliquis and recent PE followed by cardiology services Dr. Nahser. Presented 04/26/2020 with increasing shortness of breath x2 weeks with associated nausea and intermittent nonbilious nonbloody vomiting approximately 2-3 times per week.  Shortness of breath worsens with exertion and states could barely walk 10 feet without becoming short of breath and also reports 20 pound unintentional weight loss since May  2021.  Patient was recently admitted 4/21 due to acute on chronic systolic congestive heart failure.  In the ED patient was tachypneic, creatinine 1.45 from baseline 1.0-1.2, elevated BNP 1824.  Echocardiogram with ejection fraction of 20 to 25% grade 3 diastolic dysfunction with septal akinesis as well as biventricular dysfunction along with severe mitral regurgitation.  CT of the chest with contrast showed poor opacification of bilateral lower lobe pulmonary arteries.  CT angiogram of the chest showed large right-sided pleural effusion.  There is also a small left-sided pleural effusion.  Patient did receive IV Lasix as well as placed on intravenous heparin followed by cardiology services as well as initiation of milrinone.  Critical care pulmonary services Dr. Icard consulted underwent flexible video fiberoptic bronchoscopy 04/29/2020 with biopsies that was unremarkable.  Follow-up CVTS with noted concern for low cardiac output and echocardiogram worsening since recent study patient underwent Impella insertion 05/03/2020 per Dr. 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  Hospital course underwent DCCV 05/10/2020 due to ongoing V. tach converted back to A. fib.  Later went into junctional   rhythm amiodarone adjusted TEE completed showing mildly dilated LV, EF 20% mildly dilated RV with moderately decreased systolic function, moderate MR.  He did remain in normal sinus rhythm after cardiac adjustments and currently remains on amiodarone as directed.  Acute on chronic anemia latest hemoglobin 8.7.  Renal function remained stable latest creatinine 0.91.  Palliative care has been consulted to establish goals of care.  Tolerating a regular consistency diet.  Therapy evaluations completed and patient was admitted for a comprehensive rehab program.   Patient's medical record from [facility] has been reviewed by the rehabilitation admission coordinator and physician.   Past  Medical History      Past Medical History:  Diagnosis Date  . Anxiety    . CHF (congestive heart failure) (HCC)    . Chronic tension headache    . Colon polyps    . Coronary artery disease 2008/2009    MI with PCI x 2, then PCI x 1 in 2009  . Depression    . Dyslipidemia    . Dysrhythmia      atrial fibrillation  . Hyperlipidemia    . Hypertension    . Lupus (HCC)      sees Dr Hawkes  . Lupus disease of the lung    . Lupus pericarditis (HCC)    . Myocardial infarction (HCC) 2008  . S/P emergency CABG x 2 10/17/2019    LIMA to LAD, SVG to ramus intermediate, EVH via right thigh  . Shortness of breath        Family History   family history includes Alcohol abuse in his father; Cancer in his brother; Diabetes in his paternal uncle; Heart disease in his mother; Hyperlipidemia in his brother and sister; Hypertension in his brother and sister; Stomach cancer in his brother.   Prior Rehab/Hospitalizations Has the patient had prior rehab or hospitalizations prior to admission? No   Has the patient had major surgery during 100 days prior to admission? Yes              Current Medications   Current Facility-Administered Medications:  .  (feeding supplement) PROSource Plus liquid 30 mL, 30 mL, Oral, BID BM, McLean, Dalton S, MD, 30 mL at 05/23/20 0948 .  0.9 %  sodium chloride infusion (Manually program via Guardrails IV Fluids), , Intravenous, Once, Van Trigt, Peter, MD .  0.9 %  sodium chloride infusion, 250 mL, Intravenous, Continuous, Van Trigt, Peter, MD, Stopped at 05/06/20 0858 .  [CANCELED] Place/Maintain arterial line, , , Until Discontinued **AND** 0.9 %  sodium chloride infusion, , Intra-arterial, PRN, Van Trigt, Peter, MD, Last Rate: 10 mL/hr at 05/22/20 1424, New Bag at 05/22/20 1424 .  0.9 %  sodium chloride infusion, 250 mL, Intravenous, PRN, Van Trigt, Peter, MD, Last Rate: 10 mL/hr at 05/16/20 1541, 250 mL at 05/16/20 1541 .  0.9 %  sodium chloride infusion, ,  Intravenous, Continuous, Van Trigt, Peter, MD, Stopped at 05/12/20 0445 .  acetaminophen (TYLENOL) tablet 650 mg, 650 mg, Oral, Q4H PRN, McLean, Dalton S, MD, 650 mg at 05/20/20 2111 .  amiodarone (PACERONE) tablet 200 mg, 200 mg, Oral, BID, Van Trigt, Peter, MD, 200 mg at 05/23/20 0948 .  apixaban (ELIQUIS) tablet 5 mg, 5 mg, Oral, BID, Van Trigt, Peter, MD, 5 mg at 05/23/20 0949 .  azaTHIOprine (IMURAN) tablet 100 mg, 100 mg, Oral, BID, Van Trigt, Peter, MD, 100 mg at 05/23/20 0948 .  bisacodyl (DULCOLAX) suppository 10 mg, 10 mg, Rectal,   Daily PRN, Van Trigt, Peter, MD .  Chlorhexidine Gluconate Cloth 2 % PADS 6 each, 6 each, Topical, Daily, Van Trigt, Peter, MD, 6 each at 05/23/20 0949 .  dapagliflozin propanediol (FARXIGA) tablet 10 mg, 10 mg, Oral, Daily, Simmons, Brittainy M, PA-C, 10 mg at 05/23/20 0948 .  dextrose 10 % infusion, , Intravenous, Continuous, Van Trigt, Peter, MD, Stopped at 05/06/20 2218 .  digoxin (LANOXIN) tablet 0.125 mg, 0.125 mg, Oral, Daily, Van Trigt, Peter, MD, 0.125 mg at 05/23/20 0948 .  docusate (COLACE) 50 MG/5ML liquid 100 mg, 100 mg, Oral, BID, Van Trigt, Peter, MD, 100 mg at 05/23/20 0948 .  docusate sodium (COLACE) capsule 100 mg, 100 mg, Oral, BID, Van Trigt, Peter, MD, 100 mg at 05/23/20 0948 .  dronabinol (MARINOL) capsule 2.5 mg, 2.5 mg, Oral, BID AC, Van Trigt, Peter, MD, 2.5 mg at 05/22/20 1740 .  feeding supplement (ENSURE ENLIVE / ENSURE PLUS) liquid 237 mL, 237 mL, Oral, TID BM, McLean, Dalton S, MD, 237 mL at 05/23/20 0949 .  fentaNYL (SUBLIMAZE) injection 12.5 mcg, 12.5 mcg, Intravenous, Q2H PRN, Van Trigt, Peter, MD, 12.5 mcg at 05/17/20 0456 .  hydroxychloroquine (PLAQUENIL) tablet 400 mg, 400 mg, Oral, QHS, Van Trigt, Peter, MD, 400 mg at 05/22/20 2147 .  lactated ringers infusion, , Intravenous, Continuous, Van Trigt, Peter, MD, Stopped at 05/11/20 0800 .  lidocaine (XYLOCAINE) 1 % (with pres) injection, , , PRN, Louk, Alexandra M, PA-C, 10 mL at  04/27/20 0918 .  midodrine (PROAMATINE) tablet 2.5 mg, 2.5 mg, Oral, TID WC, McLean, Dalton S, MD, 2.5 mg at 05/23/20 0721 .  milrinone (PRIMACOR) 20 MG/100 ML (0.2 mg/mL) infusion, 0.125 mcg/kg/min, Intravenous, Continuous, McLean, Dalton S, MD, Last Rate: 2.4 mL/hr at 05/23/20 0000, 0.125 mcg/kg/min at 05/23/20 0000 .  multivitamin with minerals tablet 1 tablet, 1 tablet, Oral, Daily, Van Trigt, Peter, MD, 1 tablet at 05/23/20 0948 .  ondansetron (ZOFRAN) injection 4 mg, 4 mg, Intravenous, Q6H PRN, Van Trigt, Peter, MD, 4 mg at 05/21/20 1239 .  oxyCODONE (Oxy IR/ROXICODONE) immediate release tablet 5 mg, 5 mg, Oral, Q6H PRN, Van Trigt, Peter, MD, 5 mg at 05/18/20 1946 .  phenol (CHLORASEPTIC) mouth spray 1 spray, 1 spray, Mouth/Throat, PRN, Van Trigt, Peter, MD .  polyethylene glycol (MIRALAX / GLYCOLAX) packet 17 g, 17 g, Oral, Daily PRN, Van Trigt, Peter, MD, 17 g at 05/12/20 0800 .  Resource ThickenUp Clear, , Oral, PRN, Van Trigt, Peter, MD .  rosuvastatin (CRESTOR) tablet 20 mg, 20 mg, Oral, Daily, Van Trigt, Peter, MD, 20 mg at 05/23/20 0948 .  sodium chloride flush (NS) 0.9 % injection 10-40 mL, 10-40 mL, Intracatheter, Q12H, Van Trigt, Peter, MD, 10 mL at 05/23/20 0949 .  sodium chloride flush (NS) 0.9 % injection 10-40 mL, 10-40 mL, Intracatheter, PRN, Van Trigt, Peter, MD .  sodium chloride flush (NS) 0.9 % injection 3 mL, 3 mL, Intravenous, Q12H, Van Trigt, Peter, MD, 3 mL at 05/22/20 2150 .  sodium chloride flush (NS) 0.9 % injection 3 mL, 3 mL, Intravenous, PRN, Van Trigt, Peter, MD .  spironolactone (ALDACTONE) tablet 12.5 mg, 12.5 mg, Oral, Daily, McLean, Dalton S, MD, 12.5 mg at 05/23/20 0948 .  tamsulosin (FLOMAX) capsule 0.4 mg, 0.4 mg, Oral, Daily, Van Trigt, Peter, MD, 0.4 mg at 05/23/20 0948 .  white petrolatum (VASELINE) gel, , Topical, PRN, Van Trigt, Peter, MD   Patients Current Diet:     Diet Order                        Diet regular Room service appropriate? Yes with  Assist; Fluid consistency: Thin  Diet effective now                      Precautions / Restrictions Precautions Precautions: Fall Precaution Comments: Primo fit Restrictions Weight Bearing Restrictions: No RUE Weight Bearing: Partial weight bearing Other Position/Activity Restrictions: R impella (can utilize R arm for stabilizing RW, avoid pushing up)    Has the patient had 2 or more falls or a fall with injury in the past year? Yes   Prior Activity Level Limited Community (1-2x/wk): independent, driving, no DME   Prior Functional Level Self Care: Did the patient need help bathing, dressing, using the toilet or eating? Independent   Indoor Mobility: Did the patient need assistance with walking from room to room (with or without device)? Independent   Stairs: Did the patient need assistance with internal or external stairs (with or without device)? Independent   Functional Cognition: Did the patient need help planning regular tasks such as shopping or remembering to take medications? Independent   Home Assistive Devices / Equipment Home Assistive Devices/Equipment: Blood pressure cuff,Eyeglasses Home Equipment: None,Shower seat   Prior Device Use: Indicate devices/aids used by the patient prior to current illness, exacerbation or injury? None of the above   Current Functional Level Cognition   Overall Cognitive Status: Impaired/Different from baseline Orientation Level: Oriented X4 Safety/Judgement: Decreased awareness of safety,Decreased awareness of deficits General Comments: pt with decreased deficit and safety awareness, unable to gauge increasing weakness and take/request rest break independently    Extremity Assessment (includes Sensation/Coordination)   Upper Extremity Assessment: Overall WFL for tasks assessed,RUE deficits/detail RUE Deficits / Details: restricted elbow and shoulder. painful. increased edema RUE Sensation: WNL RUE Coordination: WNL  Lower  Extremity Assessment: Defer to PT evaluation     ADLs   Overall ADL's : Needs assistance/impaired Eating/Feeding: Set up,Sitting Eating/Feeding Details (indicate cue type and reason): Cortrak to be placed Grooming: Wash/dry hands,Wash/dry face,Set up,Sitting,Oral care Grooming Details (indicate cue type and reason): Items placed outside reach to encourage leaning forward and increase trunk strength. Upper Body Bathing: Moderate assistance,Bed level Lower Body Bathing: Maximal assistance,Bed level Upper Body Dressing : Maximal assistance,Bed level Lower Body Dressing: Maximal assistance,Sitting/lateral leans Lower Body Dressing Details (indicate cue type and reason): socks placed to mid foot, patient encouraged to bring feet closer to him while leaning forward to comfort. Toilet Transfer: Moderate assistance,+2 for safety/equipment,+2 for physical assistance,Ambulation,RW (simualted to recliner) Toilet Transfer Details (indicate cue type and reason): Mod A +2 for power up and gain balance. Functional mobility during ADLs: Minimal assistance,+2 for physical assistance,+2 for safety/equipment,Rolling walker,Moderate assistance General ADL Comments: Patient noted to walk 300' this am with staff.     Mobility   Overal bed mobility: Modified Independent Bed Mobility: Supine to Sit Rolling: Min assist Sidelying to sit: Min assist,+2 for physical assistance Supine to sit: HOB elevated,Min assist Sit to supine: Min assist,+2 for safety/equipment General bed mobility comments: HOB flat, increased time     Transfers   Overall transfer level: Needs assistance Equipment used: Rolling walker (2 wheeled) Transfers: Sit to/from Stand Sit to Stand: Min guard Stand pivot transfers: +2 safety/equipment,Min assist General transfer comment: min assist to power up     Ambulation / Gait / Stairs / Wheelchair Mobility   Ambulation/Gait Ambulation/Gait assistance: Min assist Gait Distance (Feet): 400  Feet Assistive device: Rolling walker (2 wheeled) Gait Pattern/deviations: Decreased stride length,Narrow base of support,Step-through pattern,Scissoring   General Gait Details: Pt with one posterior LOB requiring minA to correct, 2 instances of running into obstacles requiring verbal cueing to correct. Gait velocity: 1.17 ft/s Gait velocity interpretation: <1.8 ft/sec, indicate of risk for recurrent falls     Posture / Balance Dynamic Sitting Balance Sitting balance - Comments: pt min guard for sitting EOB Balance Overall balance assessment: Needs assistance Sitting-balance support: Feet supported,No upper extremity supported Sitting balance-Leahy Scale: Good Sitting balance - Comments: pt min guard for sitting EOB Postural control: Posterior lean Standing balance support: Bilateral upper extremity supported,During functional activity Standing balance-Leahy Scale: Poor Standing balance comment: relies on UE support from RW     Special needs/care consideration Continuous Drip IV  milrinone .125 infusion    Previous Home Environment (from acute therapy documentation) Living Arrangements: Spouse/significant other,Children Available Help at Discharge: Family,Available 24 hours/day Type of Home: House Home Layout: Two level,Bed/bath upstairs Alternate Level Stairs-Rails: Left Alternate Level Stairs-Number of Steps: flight Home Access: Stairs to enter Entrance Stairs-Number of Steps: 1 Bathroom Shower/Tub: Walk-in shower Bathroom Toilet: Standard Bathroom Accessibility: Yes How Accessible: Accessible via walker Home Care Services: No   Discharge Living Setting Plans for Discharge Living Setting: Patient's home Type of Home at Discharge: House Discharge Home Layout: Bed/bath upstairs,Two level Alternate Level Stairs-Rails: Right Alternate Level Stairs-Number of Steps: full flight Discharge Home Access: Stairs to enter Entrance Stairs-Rails: None Entrance Stairs-Number of Steps:  1 Discharge Bathroom Shower/Tub: Walk-in shower Discharge Bathroom Toilet: Standard Discharge Bathroom Accessibility: Yes How Accessible: Accessible via walker Does the patient have any problems obtaining your medications?: No   Social/Family/Support Systems Patient Roles: Spouse Anticipated Caregiver: Terry Kratochvil (spouse) Anticipated Caregiver's Contact Information: Terry [phone] Ability/Limitations of Caregiver: supervision Caregiver Availability: 24/7 Discharge Plan Discussed with Primary Caregiver: Yes Is Caregiver In Agreement with Plan?: Yes Does Caregiver/Family have Issues with Lodging/Transportation while Pt is in Rehab?: No   Goals Patient/Family Goal for Rehab: PT/OT mod I Expected length of stay: 5-9 days Additional Information: pathway to LVAD Pt/Family Agrees to Admission and willing to participate: Yes Program Orientation Provided & Reviewed with Pt/Caregiver Including Roles  & Responsibilities: Yes   Decrease burden of Care through IP rehab admission: n/a   Possible need for SNF placement upon discharge: n/a   Patient Condition: I have reviewed medical records from [facility], spoken with CM, and patient and spouse. I met with patient at the bedside for inpatient rehabilitation assessment.  Patient will benefit from ongoing PT and OT, can actively participate in 3 hours of therapy a day 5 days of the week, and can make measurable gains during the admission.  Patient will also benefit from the coordinated team approach during an Inpatient Acute Rehabilitation admission.  The patient will receive intensive therapy as well as Rehabilitation physician, nursing, social worker, and care management interventions.  Due to safety, medication administration, patient education and endurance the patient requires 24 hour a day rehabilitation nursing.  The patient is currently min guard with mobility and basic ADLs.  Discharge setting and therapy post discharge at home with  outpatient is anticipated.  Patient has agreed to participate in the Acute Inpatient Rehabilitation Program and will admit today.   Preadmission Screen Completed By:  Caitlin E Warren, PT, DPT 05/23/2020 9:52 AM ______________________________________________________________________   Discussed status with Dr. Miyani Cronic on 05/23/20  at 9:52 AM  and received approval for admission today.    Admission Coordinator:  Caitlin E Warren, PT, DPT time 9:52 AM /Date 05/23/20       Assessment/Plan: Diagnosis: Pulmonary debility 1. Does the need for close, 24 hr/day Medical supervision in concert with the patient's rehab needs make it unreasonable for this patient to be served in a less intensive setting? Yes 2. Co-Morbidities requiring supervision/potential complications: right sided pleural effusion, CAD, HLD, paroxysmal afib, heart failure, LVAD 3. Due to bladder management, bowel management, safety, skin/wound care, disease management, medication administration, pain management and patient education, does the patient require 24 hr/day rehab nursing? Yes 4. Does the patient require coordinated care of a physician, rehab nurse, PT, OT to address physical and functional deficits in the context of the above medical diagnosis(es)? Yes Addressing deficits in the following areas: balance, endurance, locomotion, strength, transferring, bowel/bladder control, bathing, dressing, feeding, grooming, toileting and psychosocial support 5. Can the patient actively participate in an intensive therapy program of at least 3 hrs of therapy 5 days a week? Yes 6. The potential for patient to make measurable gains while on inpatient rehab is excellent 7. Anticipated functional outcomes upon discharge from inpatient rehab: modified independent PT, modified independent OT, independent SLP 8. Estimated rehab length of stay to reach the above functional goals is: 5-7 days modI 9. Anticipated discharge destination: Home 10. Overall  Rehab/Functional Prognosis: excellent     MD Signature: Irais Mottram, MD  

## 2020-05-20 NOTE — Progress Notes (Addendum)
Patient ID: Cody Arellano, male   DOB: [birth year], 74 y.o.   MRN: [id number]     Advanced Heart Failure Rounding Note  PCP-Cardiologist: Mertie Moores, MD   Subjective:    Impella 5.5 placed on 11/30 with cardiogenic shock. Impella removed 12/13.   VT 12/7 early am had DCCV, converted back to afib.  Later went into a junctional rhythm and amiodarone stopped, then converted to NSR.   TEE-guided DCCV on 12/3. TEE with mildly dilated LV, EF 20%, mildly dilated RV with moderately decreased systolic function, moderate MR.  He remains in NSR today.   CXR with possible PNA right lung.  He has completed course of vancomycin/cefepime, now afebrile.   Platelets back to normal range.  HIT negative. He got 1u RBCs 12/4 and again 12/6.   Impella extracted 12/13. Milrinone added post explant for marginal co-ox 51%. Briefly required NE for low BP. Losartan and spiro stopped.   Now off NE. Remains on Milrinone 0.125. Co-ox lower today at 58% (down from 67%). CVP 6.  BP stable on midodrine.   OOB sitting up in chair. Has already walked today. Breakfast tray at bedside untouched. Not hungry yet. Waiting on CIR bed.      - Echo: EF 20-25% with septal akinesis, mid to apical inferior akinesis, apical anterior and apex akinesis.  Moderately dilated and dysfunctional RV.  Moderate TR.  Severe MR.  Bicuspid aortic valve with no stenosis, mild aortic insufficiency.  - LHC:  1. Dominant LCx, no significant disease in this vessel.  2. 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  The LIMA-LAD is also present.  3. The SVG-ramus has been occluded.  There is severe and diffuse in-stent restenosis in the proximal ramus.   Objective:   Weight Range: 60.1 kg Body mass index is 17.97 kg/m.   Vital Signs:   Temp:  [97.6 F (36.4 C)-98.6 F (37 C)] 97.7 F (36.5 C) (12/17 0321) Pulse Rate:  [58-67] 58 (12/17 0700) Resp:  [13-26] 15 (12/17 0700) BP: (99-125)/(50-71) 116/63 (12/17 0700) SpO2:   [95 %-100 %] 99 % (12/17 0700) Weight:  [60.1 kg] 60.1 kg (12/17 0500) Last BM Date: 05/19/20  Weight change: Filed Weights   05/18/20 0645 05/19/20 0600 05/20/20 0500  Weight: 60.3 kg 60.3 kg 60.1 kg    Intake/Output:   Intake/Output Summary (Last 24 hours) at 05/20/2020 0727 Last data filed at 05/20/2020 0700 Gross per 24 hour  Intake 1116.9 ml  Output 2200 ml  Net -1083.1 ml      Physical Exam    CVP 6 General:  fatigue appearing, thin AAM, sitting up in chair. No respiratory difficulty HEENT: normal Neck: supple. JVD 7 cm. Carotids 2+ bilat; no bruits. No lymphadenopathy or thyromegaly appreciated. Cor: PMI nondisplaced. Regular rate & rhythm. No rubs, gallops or murmurs. Lungs: clear, no wheezing  Abdomen: soft, nontender, nondistended. No hepatosplenomegaly. No bruits or masses. Good bowel sounds. Extremities: thin extremities, no cyanosis, clubbing, rash, edema Neuro: alert & oriented x 3, cranial nerves grossly intact. moves all 4 extremities w/o difficulty. Affect pleasant.   Telemetry    NSR/ SBP upper 50s low 60s Personally reviewed   Labs    CBC Recent Labs    05/19/20 0411 05/20/20 0230  WBC 5.8 5.1  HGB 8.5* 8.7*  HCT 28.2* 30.1*  MCV 91.9 93.8  PLT 272 478   Basic Metabolic Panel Recent Labs    05/19/20 0411 05/20/20 0230  NA 138 140  K  3.9 3.7  CL 99 99  CO2 29 30  GLUCOSE 120* 99  BUN 26* 23  CREATININE 1.03 0.91  CALCIUM 8.4* 8.4*   Liver Function Tests No results for input(s): AST, ALT, ALKPHOS, BILITOT, PROT, ALBUMIN in the last 72 hours. No results for input(s): LIPASE, AMYLASE in the last 72 hours. Cardiac Enzymes No results for input(s): CKTOTAL, CKMB, CKMBINDEX, TROPONINI in the last 72 hours.  BNP: BNP (last 3 results) Recent Labs    04/06/20 2134 04/26/20 1319 04/30/20 0248  BNP 1,677.6* 1,824.4* 1,685.5*    ProBNP (last 3 results) No results for input(s): PROBNP in the last 8760 hours.   D-Dimer No  results for input(s): DDIMER in the last 72 hours. Hemoglobin A1C No results for input(s): HGBA1C in the last 72 hours. Fasting Lipid Panel No results for input(s): CHOL, HDL, LDLCALC, TRIG, CHOLHDL, LDLDIRECT in the last 72 hours. Thyroid Function Tests No results for input(s): TSH, T4TOTAL, T3FREE, THYROIDAB in the last 72 hours.  Invalid input(s): FREET3  Other results:   Imaging    No results found.   Medications:     Scheduled Medications: . (feeding supplement) PROSource Plus  30 mL Oral BID BM  . sodium chloride   Intravenous Once  . amiodarone  200 mg Oral BID  . apixaban  5 mg Oral BID  . azaTHIOprine  100 mg Oral BID  . Chlorhexidine Gluconate Cloth  6 each Topical Daily  . dapagliflozin propanediol  10 mg Oral Daily  . digoxin  0.125 mg Oral Daily  . docusate  100 mg Oral BID  . docusate sodium  100 mg Oral BID  . dronabinol  2.5 mg Oral BID AC  . feeding supplement  237 mL Oral TID BM  . hydroxychloroquine  400 mg Oral QHS  . lipase/protease/amylase)  20,880 Units Per Tube Once   And  . sodium bicarbonate  650 mg Per Tube Once  . midodrine  2.5 mg Oral TID WC  . multivitamin with minerals  1 tablet Oral Daily  . rosuvastatin  20 mg Oral Daily  . sodium chloride flush  10-40 mL Intracatheter Q12H  . sodium chloride flush  3 mL Intravenous Q12H  . tamsulosin  0.4 mg Oral Daily  . torsemide  20 mg Oral Daily    Infusions: . sodium chloride Stopped (05/06/20 0858)  . sodium chloride    . sodium chloride 250 mL (05/16/20 1541)  . sodium chloride Stopped (05/12/20 0445)  . dextrose Stopped (05/06/20 2218)  . lactated ringers Stopped (05/11/20 0800)  . milrinone 0.125 mcg/kg/min (05/20/20 0700)  . norepinephrine (LEVOPHED) Adult infusion Stopped (05/18/20 1125)    PRN Medications: [CANCELED] Place/Maintain arterial line **AND** sodium chloride, sodium chloride, acetaminophen, bisacodyl, fentaNYL (SUBLIMAZE) injection, lidocaine, ondansetron (ZOFRAN)  IV, oxyCODONE, phenol, polyethylene glycol, Resource ThickenUp Clear, sodium chloride flush, sodium chloride flush, white petrolatum   Assessment/Plan   1. 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  Echo this admission looks worse than 10/21 with EF 20-25% with septal akinesis, mid to apical inferior akinesis, apical anterior and apex akinesis, moderately dilated and dysfunctional RV, moderate TR, severe MR, bicuspid aortic valve with no stenosis, mild aortic insufficiency. Progressive/end-stage CHF worsened by onset of atrial fibrillation. Cardiogenic shock 11/30 with co-ox down to 30%, Impella 5.5 placed to allow time to stabilize and consider further steps.  Impella extracted 12/13. 0.125 milrinone added post explant for marginal co-ox at 51%. Co-ox overall improved but a bit lower today  at 58%. Off NE, SBPs stable on midodrine.  - continue milrinone 0.125 and follow co-ox  - Off losartan and spironolactone with low BP.  - Continue midodrine 2.5 mg tid for BP support - Continue digoxin 0.125  - Continue Farxiga 10 mg  - Continue Torsemide 20 mg  - He has end-stage biventricular HF with cardiogenic shock physiology in the setting of AF/RVR and severe MR and advanced ischemic CM.  He now s/p Impella 5.5 to support DC-CV and medical optimization. Maintaining NSR. He is not a transplant candidate.  Barriers to LVAD include nutritional status/deconditioning and RV.  I think RV would be acceptable.  Nutritional status improving.  We discussed in [place], not LVAD candidate at this point with malnutrition/deconditioning.  Continue milrinone to allow time to improve nutritional status and mobility (hopefully to CIR).  - Palliative Care has seen and continue to discuss [place]. CAD: Prior history of MI with LAD and ramus PCI. Admitted May 2021 with anterior STEMI. LHC with occluded ostial LAD (in-stent), 90% ostial ramus, 60-70% proximal LCx, nondominant RCA. PTCA to LAD and ramus  to restore flow, then CABG with LIMA-LAD and SVG-ramus.  No chest pain, doubt ACS.  Fall in EF from 10/21 to 11/21, but he did not present with ACS.  [place] 12/1 showed patient LIMA-LAD, patent native LAD, patent dominant LCx.  The SVG-ramus is occluded with severe diffuse in-stent restenosis in proximal ramus.  Ramus not intervened upon as this would be a complex intervention and unlikely to markedly improve EF. No chest pain.  - Continue ASA 81 and statin.    3. AKI: Creatinine now trending down with improved cardiac output. Renal function normalized.   4. Right pleural effusion: S/p thoracentesis, transudative (CHF).  5. Atrial fibrillation/flutter with RVR: DCCV on 12/3, went back to NSR 12/7. Remains in NSR.  - Continue amiodarone at 200 mg bid, decrease to 200 mg daily in 5 more days.   - continue apixaban 5 mg bid  6. Mitral regurgitation:  TEE 12/3 with moderate central MR.  Doubt MitraClip will help him much with moderate MR and he is likely too advanced.  7. H/o PE: In 10/21.  Keep anticoagulated, on apixaban.  8. Bicuspid aortic valve: Mild AI, no AS.  9. SLE: H/o pericarditis.  On Imuran and hydroxychloroquine.  - Restarted home SLE regimen now that he is taking po.   10. Pulmonary nodules/emphysema: CCM following, s/p bronch/BAL (no growth).  11. ID: Concern for right-sided PNA, ?aspiration.  Cultures NGTD.  Now afebrile and has completed course of vancomycin/cefepime.   12. Thrombocytopenia: Resolved.  May have been due to critical illness, low grade hemolysis with Impella. LDH ok.  HIT negative.  - Back on apixaban.  13. Hypokalemia/hypomag: Supplement as needed.  14. Acute blood loss anemia: No obvious site.   - Transfuse hgb < 8.  15. VT: Episode early am 12/7, required DCCV.  No recurrence.  16. Deconditioning: - continue to mobilize  - CIR following, hopefully could go soon.    Length of Stay: [address], PA-C  05/20/2020, 7:27 AM  Advanced Heart Failure  Team Pager [phone] (M-F; 7a - 4p)  Please contact [facility] for night-coverage after hours (4p -7a ) and weekends on [email or web address]  Patient seen with PA, agree with the above note.   Stable today, co-ox 58% on milrinone 0.125.  CVP 6.  SBP 100s. Remains in NSR.   General: NAD Neck: No JVD, no thyromegaly or thyroid  nodule.  Lungs: Clear to auscultation bilaterally with normal respiratory effort. CV: Nondisplaced PMI.  Heart regular S1/S2, no S3/S4, no murmur.  No peripheral edema.   Abdomen: Soft, nontender, no hepatosplenomegaly, no distention.  Skin: Intact without lesions or rashes.  Neurologic: Alert and oriented x 3.  Psych: Normal affect. Extremities: No clubbing or cyanosis.  HEENT: Normal.   BP stable, restart spironolactone 12.5 daily.  Can probably stop midodrine over weekend if continues to keep SBP > 100.    Waiting for CIR bed, walking in halls.  Goal is going to be to get him to CIR on milrinone 0.125, continue working on mobility and nutrition to see if he can be LVAD candidate in the future.  Will ask nutrition to reassess to see how he is doing off nocturnal tube feeds.   Loralie Champagne 05/20/2020 8:07 AM

## 2020-05-20 NOTE — TOC Initial Note (Signed)
Transition of Care [facility]) - Initial/Assessment Note    Patient Details  Name: Cody Arellano MRN: [id number] Date of Birth: 04/16/1946  Transition of Care [facility] CM/SW Contact:    Bethena Roys, RN Phone Number: 05/20/2020, 4:22 PM  Clinical Narrative: Risk for readmission assessment completed. Case Manager received a call from the Inpatient rehab coordinator stating that she is suggesting home for patient at this time secondary to ambulation distance. Case Manager has reached out to [facility] with [facility] in case patient needs to return home on IV Milrinone. Case Manager reached out to [place] regarding if Case Manager needs to arrange home health. PA stated that the team has reached out to rehab to see if they can accept the patient. Case Manager will await to hear from the team regarding plan of care CIR vs. Home.               Expected Discharge Plan: IP Rehab Facility Barriers to Discharge: Continued Medical Work up   Expected Discharge Plan and Services Expected Discharge Plan: [place]     Post Acute Care Choice: IP Rehab Living arrangements for the past 2 months: Single Family Home                    Prior Living Arrangements/Services Living arrangements for the past 2 months: Single Family Home Lives with:: Spouse    Activities of Daily Living Home Assistive Devices/Equipment: Blood pressure cuff,Eyeglasses ADL Screening (condition at time of admission) Patient's cognitive ability adequate to safely complete daily activities?: Yes Is the patient deaf or have difficulty hearing?: No Does the patient have difficulty seeing, even when wearing glasses/contacts?: No Does the patient have difficulty concentrating, remembering, or making decisions?: No Patient able to express need for assistance with ADLs?: Yes Does the patient have difficulty dressing or bathing?: No Independently performs ADLs?: Yes (appropriate for developmental age) Does the patient have  difficulty walking or climbing stairs?: No Weakness of Legs: Both Weakness of Arms/Hands: None  Alcohol / Substance Use: Not Applicable Psych Involvement: No (comment)  Admission diagnosis:  Pleural effusion on right [J90] Acute on chronic congestive heart failure, unspecified heart failure type [facility]) [I50.9] Patient Active Problem List   Diagnosis Date Noted  . Cardiogenic shock ([place])   . LVAD (left ventricular assist device) present ([place])   . Acute on chronic congestive heart failure ([place])   . Pleural effusion on right 04/26/2020  . Shortness of breath 04/26/2020  . Elevated brain natriuretic peptide (BNP) level 04/26/2020  . AKI (acute kidney injury) ([place]) 04/26/2020  . Pulmonary nodules 04/26/2020  . Nausea & vomiting 04/26/2020  . Valvular heart disease 04/08/2020  . Malnutrition of mild degree ([place]) 04/08/2020  . Acute on chronic systolic CHF (congestive heart failure) ([place]) 04/07/2020  . Pulmonary embolism ([place]) 03/23/2020  . Heart failure, systolic, chronic ([place]) 70/26/3785  . Fatigue 03/09/2020  . Atherosclerotic heart disease of native coronary artery with angina pectoris ([place]) 03/09/2020  . Protein-calorie malnutrition, severe ([place]) 03/09/2020  . Hypotension 01/29/2020  . Anemia 01/29/2020  . Prostatic hypertrophy 01/29/2020  . Pancreatic lesion 01/29/2020  . Rectal abscess 01/28/2020  . 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 Paroxysmal atrial fibrillation ([place]) 01/20/2019  . Chronic tension headache   . Preventative health care 01/09/2018  . Memory loss 01/09/2018  . Advance directive discussed with patient 01/09/2018  . Hyperlipemia 01/17/2016  . 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 Personal history of colonic  polyps 05/20/2013  . Episodic mood disorder ([place]) 05/13/2013  . Systemic lupus erythematosus ([place]) 05/13/2013  . Colon polyps   . CAD (coronary artery disease) 04/09/2013  . 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 ILD (interstitial lung disease) ([place]) 02/04/2013   PCP:  Venia Carbon, MD Pharmacy:   [id number] [place], [address] [address] Phone: [phone] Fax: [place], [place] - [place] [phone] [place] [address] Phone: [phone] Fax: [phone]  [name] [place], [place] - [address] [place] [address] Phone: [phone] Fax: [phone]   Readmission Risk Interventions Readmission Risk Prevention Plan 05/20/2020 03/25/2020  Transportation Screening Complete Complete  PCP or Specialist Appt within 3-5 Days - Complete  HRI or [place] - Complete  Social Work Consult for [place] Planning/Counseling - Complete  Palliative Care Screening - Not Applicable  Medication Review Press photographer) Complete Complete  HRI or Home Care Consult Complete -  SW Recovery Care/Counseling Consult Complete -  Palliative Care Screening Complete -  [place] Not Applicable -  Some recent data might be hidden

## 2020-05-20 NOTE — Progress Notes (Signed)
Inpatient Rehab Admissions Coordinator:   Rehab MD, Dr. Naaman Plummer, spoke with Dr. Haroldine Laws regarding Mr. Cody Arellano.  CIR will follow over the weekend in case a bed becomes available.  My colleague Gayland Curry will follow up.    Shann Medal, PT, DPT Admissions Coordinator [phone] 05/20/20  5:22 PM

## 2020-05-20 NOTE — Telephone Encounter (Signed)
Advanced Heart Failure Patient Advocate Encounter  Prior Authorization for [place] has been approved.    Effective dates: 04/19/20 through 05/19/2021  Was able to get the patient a co-pay card to bring the total cost to $0 for 90 days  BIN [id number] PCN CN GROUP [id number] ID [place], CPhT

## 2020-05-20 NOTE — H&P (Signed)
Physical Medicine and Rehabilitation Admission H&P    Chief Complaint  Patient presents with  . Shortness of Breath  : HPI: Cody Arellano is a 74 year old right-handed male with history significant for lupus diagnosed in 2014 maintained on Imuran as well as Plaquenil followed by Dr. Gavin Pound, interstitial lung disease, CAD/CABG May 4034, chronic systolic congestive heart failure, PAF on Eliquis and recent PE followed by cardiology services Dr. Acie Fredrickson.  Per chart review patient lives with spouse.  Independent prior to admission.  Two-level home bed and bath upstairs.  Family with good support.  Presented 04/26/2020 with increasing shortness of breath x2 weeks with associated nausea and intermittent nonbilious nonbloody vomiting approximately 2-3 times per week.  Shortness of breath worsens with exertion and states could barely walk 10 feet without becoming short of breath and also reports 20 pound unintentional weight loss since May 2021.  Patient was recently admitted 4/21 due to acute on chronic systolic congestive heart failure.  In the ED patient was tachypneic, creatinine 1.45 from baseline 1.0-1.2, elevated BNP 1824.  Echocardiogram with ejection fraction of 20 to 74% grade 3 diastolic dysfunction with septal akinesis as well as biventricular dysfunction along with severe mitral regurgitation.  CT of the chest with contrast showed poor opacification of bilateral lower lobe pulmonary arteries.  CT angiogram of the chest showed large right-sided pleural effusion.  There is also a small left-sided pleural effusion.  Patient did receive IV Lasix as well as placed on intravenous heparin followed by cardiology services as well as initiation of milrinone.  Critical care pulmonary services Dr. Valeta Harms consulted underwent flexible video fiberoptic bronchoscopy 04/29/2020 with biopsies that was unremarkable.  Follow-up CVTS with noted concern for low cardiac output and echocardiogram worsening since  recent study patient underwent Impella insertion 05/03/2020 per Dr. 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  Hospital course underwent DCCV 05/10/2020 due to ongoing V. tach converted back to A. fib.  Later went into junctional rhythm amiodarone adjusted TEE completed showing mildly dilated LV, EF 20% mildly dilated RV with moderately decreased systolic function, moderate MR.  He did remain in normal sinus rhythm after cardiac adjustments and currently remains on amiodarone as directed.  Acute on chronic anemia latest hemoglobin 8.9.  Renal function remained stable latest creatinine 1.04.  Palliative care has been consulted to establish goals of care.  Tolerating a regular consistency diet.  Due to patient generalized decline debility related to congestive heart failure multimedical he was admitted for a comprehensive rehab program.    Review of Systems  Constitutional: Positive for weight loss.  HENT: Negative for hearing loss.   Eyes: Negative for blurred vision and double vision.  Respiratory: Positive for cough and shortness of breath.   Cardiovascular: Positive for palpitations and leg swelling. Negative for chest pain.  Gastrointestinal: Positive for constipation. Negative for heartburn, nausea and vomiting.  Genitourinary: Positive for urgency. Negative for dysuria, flank pain and hematuria.  Musculoskeletal: Positive for joint pain and myalgias.  Skin: Negative for rash.  Neurological: Positive for headaches.  Psychiatric/Behavioral:       Anxiety  All other systems reviewed and are negative.  Past Medical History:  Diagnosis Date  . Anxiety   . CHF (congestive heart failure) ([place])   . Chronic tension headache   . Colon polyps   . Coronary artery disease 2008/2009   MI with PCI x 2, then PCI x 1 in 2009  . Depression   . Dyslipidemia   .  Dysrhythmia    atrial fibrillation  . Hyperlipidemia   . Hypertension   . Lupus [facility])    sees Dr Trudie Reed   . Lupus disease of the lung   . 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 Myocardial infarction ([place]) 2008  . 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 Shortness of breath    Past Surgical History:  Procedure Laterality Date  . BRONCHIAL WASHINGS  04/29/2020   Procedure: BRONCHIAL WASHINGS;  Surgeon: Garner Nash, DO;  Location: [place] [facility];  Service: Pulmonary;;  . CARDIAC CATHETERIZATION    . CLIPPING OF ATRIAL APPENDAGE N/A 10/17/2019   Procedure: Clipping Of Atrial Appendage using AtriCure [id number] 45 MM AtriClip.;  Surgeon: Rexene Alberts, MD;  Location: [place];  Service: Open Heart Surgery;  Laterality: N/A;  . CORONARY ARTERY BYPASS GRAFT N/A 10/17/2019   Procedure: CORONARY ARTERY BYPASS GRAFTING (CABG) using LIMA to LAD; Endoscopic harvest right greater saphenous vein: SVG to RAMUS.;  Surgeon: Rexene Alberts, MD;  Location: [place];  Service: Open Heart Surgery;  Laterality: N/A;  . CORONARY BALLOON ANGIOPLASTY N/A 10/17/2019   Procedure: CORONARY BALLOON ANGIOPLASTY;  Surgeon: Nelva Bush, MD;  Location: [place] [facility];  Service: Cardiovascular;  Laterality: N/A;  . coronary stents  2009  . CORONARY/GRAFT ACUTE MI REVASCULARIZATION N/A 10/17/2019   Procedure: Coronary/Graft Acute MI Revascularization;  Surgeon: Nelva Bush, MD;  Location: [place] [facility];  Service: Cardiovascular;  Laterality: N/A;  . CORONARY/GRAFT ANGIOGRAPHY N/A 05/04/2020   Procedure: CORONARY/GRAFT ANGIOGRAPHY;  Surgeon: Larey Dresser, MD;  Location: [place] [facility];  Service: Cardiovascular;  Laterality: N/A;  . ENDOVEIN HARVEST OF GREATER SAPHENOUS VEIN Right 10/17/2019   Procedure: [place] Of Greater Saphenous Vein;  Surgeon: Rexene Alberts, MD;  Location: [place];  Service: Open Heart Surgery;  Laterality: Right;  . IABP INSERTION N/A 10/17/2019   Procedure: IABP Insertion;  Surgeon: Nelva Bush, MD;  Location: [place] [facility];  Service: Cardiovascular;  Laterality: N/A;  . INCISION AND DRAINAGE ABSCESS N/A 01/29/2020   Procedure: INCISION AND DRAINAGE BILATERAL PERIRECTAL ABSCESS;  Surgeon: Stark Klein, MD;  Location: [place];  Service: General;  Laterality: N/A;  . IR THORACENTESIS ASP PLEURAL SPACE W/IMG GUIDE  10/26/2019  . IR THORACENTESIS ASP PLEURAL SPACE W/IMG GUIDE  04/27/2020  . LEFT HEART CATH AND CORS/GRAFTS ANGIOGRAPHY N/A 05/04/2020   Procedure: LEFT HEART CATH AND CORS/GRAFTS ANGIOGRAPHY;  Surgeon: Larey Dresser, MD;  Location: [place] [facility];  Service: Cardiovascular;  Laterality: N/A;  . PLACEMENT OF IMPELLA LEFT VENTRICULAR ASSIST DEVICE Right 05/03/2020   Procedure: PLACEMENT OF IMPELLA 5.5 LEFT VENTRICULAR ASSIST DEVICE VIA RIGHT AXILLARY;  Surgeon: [name], MD;  Location: [facility];  Service: Open Heart Surgery;  Laterality: Right;  . REMOVAL OF IMPELLA LEFT VENTRICULAR ASSIST DEVICE N/A 05/16/2020   Procedure: REMOVAL OF IMPELLA LEFT VENTRICULAR ASSIST DEVICE;  Surgeon: Ivin Poot, MD;  Location: [place];  Service: Open Heart Surgery;  Laterality: N/A;  . RIGHT/LEFT HEART CATH AND CORONARY ANGIOGRAPHY N/A 10/17/2019   Procedure: RIGHT/LEFT HEART CATH AND CORONARY ANGIOGRAPHY;  Surgeon: Nelva Bush, MD;  Location: [place] [facility];  Service: Cardiovascular;  Laterality: N/A;  . TEE WITHOUT CARDIOVERSION  10/17/2019   Procedure: Transesophageal Echocardiogram (Tee);  Surgeon: Rexene Alberts, MD;  Location: [place];  Service: Open Heart Surgery;;  . TEE  WITHOUT CARDIOVERSION N/A 05/03/2020   Procedure: TRANSESOPHAGEAL ECHOCARDIOGRAM (TEE);  Surgeon: [name], MD;  Location: [facility];  Service: Open Heart Surgery;  Laterality: N/A;  . TEE WITHOUT CARDIOVERSION N/A 05/06/2020   Procedure: TRANSESOPHAGEAL ECHOCARDIOGRAM (TEE);  Surgeon: Larey Dresser, MD;  Location: [facility];  Service: Cardiovascular;  Laterality: N/A;  bedside  . VIDEO BRONCHOSCOPY N/A 04/29/2020    Procedure: VIDEO BRONCHOSCOPY WITHOUT FLUORO;  Surgeon: Garner Nash, DO;  Location: [place];  Service: Pulmonary;  Laterality: N/A;   Family History  Problem Relation Age of Onset  . Heart disease Mother   . Alcohol abuse Father   . Hyperlipidemia Sister   . Hypertension Sister   . Hyperlipidemia Brother   . Hypertension Brother   . Cancer Brother        ?lung cancer  . Stomach cancer Brother   . Diabetes Paternal Uncle   . Colon cancer Neg Hx   . Esophageal cancer Neg Hx   . Rectal cancer Neg Hx    Social History:  reports that he quit smoking about 27 years ago. His smoking use included cigarettes. He has a 20.00 pack-year smoking history. He has never used smokeless tobacco. He reports that he does not drink alcohol and does not use drugs. Allergies: No Known Allergies Medications Prior to Admission  Medication Sig Dispense Refill  . apixaban (ELIQUIS) 5 MG TABS tablet Take 1 tablet (5 mg total) by mouth 2 (two) times daily. 60 tablet 0  . aspirin EC 81 MG tablet Take 1 tablet (81 mg total) by mouth daily.    [name] azaTHIOprine (IMURAN) 50 MG tablet Take 100 mg by mouth 2 (two) times daily.   2  . furosemide (LASIX) 40 MG tablet Please take 40 mg oral daily, and take extra 40 mg of Lasix if you gain 3 pounds have worsening lower extremity edema for 1 day. 30 tablet   . hydroxychloroquine (PLAQUENIL) 200 MG tablet Take 400 mg by mouth at bedtime.     [name] oxyCODONE (OXY IR/ROXICODONE) 5 MG immediate release tablet Take 1 tablet (5 mg total) by mouth every 6 (six) hours as needed for severe pain. 30 tablet 0  . polyethylene glycol (MIRALAX / GLYCOLAX) 17 g packet Take 17 g by mouth daily as needed for moderate constipation.     . polyvinyl alcohol (ARTIFICIAL TEARS) 1.4 % ophthalmic solution Place 2 drops into both eyes daily.     . rosuvastatin (CRESTOR) 20 MG tablet Take 20 mg by mouth daily.    . tamsulosin (FLOMAX) 0.4 MG CAPS capsule Take 0.4 mg by mouth every evening.        Drug Regimen Review Drug regimen was reviewed and remains appropriate with no significant issues identified  Home: Home Living Family/patient expects to be discharged to:: Private residence Living Arrangements: Spouse/significant other,Children Available Help at Discharge: Family,Available 24 hours/day Type of Home: House Home Access: Stairs to enter [employer] of Steps: 1 Home Layout: Two level,Bed/bath upstairs Alternate Level Stairs-Number of Steps: flight Alternate Level Stairs-Rails: Left Bathroom Shower/Tub: Multimedia programmer: Standard Bathroom Accessibility: Yes Home Equipment: None,Shower seat   Functional History: Prior Function Level of Independence: Independent  Functional Status:  Mobility: Bed Mobility Overal bed mobility: Needs Assistance Bed Mobility: Supine to Sit Rolling: Min assist Sidelying to sit: Min assist,+2 for physical assistance Supine to sit: HOB elevated,Min assist Sit to supine: Min assist,+2 for safety/equipment General bed mobility comments: In chair on arrival. Transfers  Overall transfer level: Needs assistance Equipment used: Rolling walker (2 wheeled) Transfers: Sit to/from Stand Sit to Stand: Min assist Stand pivot transfers: +2 safety/equipment,Min assist General transfer comment: min assist to power up Ambulation/Gait Ambulation/Gait assistance: Min assist,Min guard Gait Distance (Feet): 490 Feet Assistive device: Rolling walker (2 wheeled) Gait Pattern/deviations: Drifts right/left,Decreased stride length,Narrow base of support,Step-through pattern General Gait Details: vc for sequencing especially with turning corners, and for increased base of support, pt requires 2-3x standing rest breaks dictated by therapy due to decreasing BoS likely due to hip weakness.  Pt progressing daily. Gait velocity: decreased Gait velocity interpretation: <1.31 ft/sec, indicative of household ambulator     ADL: ADL Overall ADL's : Needs assistance/impaired Eating/Feeding: Set up,Sitting Eating/Feeding Details (indicate cue type and reason): Cortrak to be placed Grooming: Wash/dry hands,Wash/dry face,Set up,Sitting,Oral care Grooming Details (indicate cue type and reason): Items placed outside reach to encourage leaning forward and increase trunk strength. Upper Body Bathing: Moderate assistance,Bed level Lower Body Bathing: Maximal assistance,Bed level Upper Body Dressing : Maximal assistance,Bed level Lower Body Dressing: Maximal assistance,Sitting/lateral leans Lower Body Dressing Details (indicate cue type and reason): socks placed to mid foot, patient encouraged to bring feet closer to him while leaning forward to comfort. Toilet Transfer: Moderate assistance,+2 for safety/equipment,+2 for physical assistance,Ambulation,RW (simualted to recliner) Toilet Transfer Details (indicate cue type and reason): Mod A +2 for power up and gain balance. Functional mobility during ADLs: Minimal assistance,+2 for physical assistance,+2 for safety/equipment,Rolling walker,Moderate assistance General ADL Comments: Patient noted to walk 300' this am with staff.  Cognition: Cognition Overall Cognitive Status: Impaired/Different from baseline Orientation Level: Oriented X4 Cognition Arousal/Alertness: Awake/alert Behavior During Therapy: WFL for tasks assessed/performed,Flat affect Overall Cognitive Status: Impaired/Different from baseline Area of Impairment: Safety/judgement Safety/Judgement: Decreased awareness of safety,Decreased awareness of deficits General Comments: pt with decreased deficit and safety awareness, unable to gauge increasing weakness and take/request rest break independently  Physical Exam: Blood pressure 125/70, pulse 67, temperature 97.7 F (36.5 C), temperature source Axillary, resp. rate (!) 23, height 6' (1.829 m), weight 60.1 kg, SpO2 98 %. Physical Exam General: Alert  and oriented x 3, No apparent distress HEENT: Head is normocephalic, atraumatic, PERRLA, EOMI, sclera anicteric, oral mucosa pink and moist, dentition intact, ext ear canals clear,  Neck: Supple without JVD or lymphadenopathy Heart: Reg rate and rhythm. +murmur Chest: Tachypneic Abdomen: Soft, non-tender, non-distended, bowel sounds positive. Extremities: No clubbing, cyanosis, or edema. Pulses are 2+ Skin: Clean and intact without signs of breakdown Neuro: Pt is cognitively appropriate with normal insight, memory, and awareness. Cranial nerves 2-12 are intact. Patient is alert sitting up in bed.  Oriented x3 and follows commands. No tremors. Motor function is grossly 4/5.  Musculoskeletal: Full ROM, No pain with AROM or PROM in the neck, trunk, or extremities. Posture appropriate Psych: Pt's affect is appropriate. Pt is cooperative   Results for orders placed or performed during the hospital encounter of 04/26/20 (from the past 48 hour(s))  CBC     Status: Abnormal   Collection Time: 05/18/20  8:22 AM  Result Value Ref Range   WBC 7.1 4.0 - 10.5 K/uL   RBC 3.14 (L) 4.22 - 5.81 MIL/uL   Hemoglobin 8.9 (L) 13.0 - 17.0 g/dL   HCT 28.4 (L) 39.0 - 52.0 %   MCV 90.4 80.0 - 100.0 fL   MCH 28.3 26.0 - 34.0 pg   MCHC 31.3 30.0 - 36.0 g/dL   RDW 21.3 (H) 11.5 - 15.5 %  Platelets 306 150 - 400 K/uL   nRBC 0.0 0.0 - 0.2 %    Comment: Performed at [facility], [place] [address] [place], [place] [address]  Glucose, capillary     Status: Abnormal   Collection Time: 05/18/20 11:20 AM  Result Value Ref Range   Glucose-Capillary 113 (H) 70 - 99 mg/dL    Comment: Glucose reference range applies only to samples taken after fasting for at least 8 hours.  Glucose, capillary     Status: None   Collection Time: 05/18/20  3:35 PM  Result Value Ref Range   Glucose-Capillary 86 70 - 99 mg/dL    Comment: Glucose reference range applies only to samples taken after fasting for at least 8 hours.   Glucose, capillary     Status: Abnormal   Collection Time: 05/18/20  9:18 PM  Result Value Ref Range   Glucose-Capillary 108 (H) 70 - 99 mg/dL    Comment: Glucose reference range applies only to samples taken after fasting for at least 8 hours.  Cooxemetry Panel (carboxy, met, total hgb, O2 sat)     Status: Abnormal   Collection Time: 05/19/20  4:05 AM  Result Value Ref Range   Total hemoglobin 8.8 (L) 12.0 - 16.0 g/dL   O2 Saturation 67.0 %   Carboxyhemoglobin 2.0 (H) 0.5 - 1.5 %   Methemoglobin 0.8 0.0 - 1.5 %    Comment: Performed at [facility] [address] [place], [place] [address]  Lactate dehydrogenase     Status: None   Collection Time: 05/19/20  4:11 AM  Result Value Ref Range   LDH 156 98 - 192 U/L    Comment: Performed at [facility], [place] [address] [place], [place] [address]  Basic metabolic panel     Status: Abnormal   Collection Time: 05/19/20  4:11 AM  Result Value Ref Range   Sodium 138 135 - 145 mmol/L   Potassium 3.9 3.5 - 5.1 mmol/L   Chloride 99 98 - 111 mmol/L   CO2 29 22 - 32 mmol/L   Glucose, Bld 120 (H) 70 - 99 mg/dL    Comment: Glucose reference range applies only to samples taken after fasting for at least 8 hours.   BUN 26 (H) 8 - 23 mg/dL   Creatinine, Ser 1.03 0.61 - 1.24 mg/dL   Calcium 8.4 (L) 8.9 - 10.3 mg/dL   GFR, Estimated >60 >60 mL/min    Comment: (NOTE) Calculated using the CKD-EPI Creatinine Equation (2021)    Anion gap 10 5 - 15    Comment: Performed at [facility] [address], [place], [place] [address]  CBC     Status: Abnormal   Collection Time: 05/19/20  4:11 AM  Result Value Ref Range   WBC 5.8 4.0 - 10.5 K/uL   RBC 3.07 (L) 4.22 - 5.81 MIL/uL   Hemoglobin 8.5 (L) 13.0 - 17.0 g/dL   HCT 28.2 (L) 39.0 - 52.0 %   MCV 91.9 80.0 - 100.0 fL   MCH 27.7 26.0 - 34.0 pg   MCHC 30.1 30.0 - 36.0 g/dL   RDW 21.1 (H) 11.5 - 15.5 %   Platelets 272 150 - 400 K/uL   nRBC 0.0 0.0 - 0.2 %    Comment:  Performed at [facility], [place] [address] [place], [place] [address]  Glucose, capillary     Status: None   Collection Time: 05/19/20  6:59  AM  Result Value Ref Range   Glucose-Capillary 79 70 - 99 mg/dL    Comment: Glucose reference range applies only to samples taken after fasting for at least 8 hours.  Glucose, capillary     Status: None   Collection Time: 05/19/20 11:29 AM  Result Value Ref Range   Glucose-Capillary 90 70 - 99 mg/dL    Comment: Glucose reference range applies only to samples taken after fasting for at least 8 hours.  Glucose, capillary     Status: None   Collection Time: 05/19/20  3:27 PM  Result Value Ref Range   Glucose-Capillary 93 70 - 99 mg/dL    Comment: Glucose reference range applies only to samples taken after fasting for at least 8 hours.  Glucose, capillary     Status: Abnormal   Collection Time: 05/19/20  9:35 PM  Result Value Ref Range   Glucose-Capillary 141 (H) 70 - 99 mg/dL    Comment: Glucose reference range applies only to samples taken after fasting for at least 8 hours.  Cooxemetry Panel (carboxy, met, total hgb, O2 sat)     Status: Abnormal   Collection Time: 05/20/20  2:30 AM  Result Value Ref Range   Total hemoglobin 8.8 (L) 12.0 - 16.0 g/dL   O2 Saturation 58.4 %   Carboxyhemoglobin 1.9 (H) 0.5 - 1.5 %   Methemoglobin 0.9 0.0 - 1.5 %    Comment: Performed at [facility] [address], [place], [place] [address]  Lactate dehydrogenase     Status: None   Collection Time: 05/20/20  2:30 AM  Result Value Ref Range   LDH 171 98 - 192 U/L    Comment: Performed at [facility], [place] [address] [place], [place] [address]  Basic metabolic panel     Status: Abnormal   Collection Time: 05/20/20  2:30 AM  Result Value Ref Range   Sodium 140 135 - 145 mmol/L   Potassium 3.7 3.5 - 5.1 mmol/L   Chloride 99 98 - 111 mmol/L   CO2 30 22 - 32 mmol/L   Glucose, Bld 99 70 - 99 mg/dL    Comment: Glucose reference range applies  only to samples taken after fasting for at least 8 hours.   BUN 23 8 - 23 mg/dL   Creatinine, Ser 0.91 0.61 - 1.24 mg/dL   Calcium 8.4 (L) 8.9 - 10.3 mg/dL   GFR, Estimated >60 >60 mL/min    Comment: (NOTE) Calculated using the CKD-EPI Creatinine Equation (2021)    Anion gap 11 5 - 15    Comment: Performed at [facility] [address] [place], [place] [address]  CBC     Status: Abnormal   Collection Time: 05/20/20  2:30 AM  Result Value Ref Range   WBC 5.1 4.0 - 10.5 K/uL   RBC 3.21 (L) 4.22 - 5.81 MIL/uL   Hemoglobin 8.7 (L) 13.0 - 17.0 g/dL   HCT 30.1 (L) 39.0 - 52.0 %   MCV 93.8 80.0 - 100.0 fL   MCH 27.1 26.0 - 34.0 pg   MCHC 28.9 (L) 30.0 - 36.0 g/dL   RDW 20.8 (H) 11.5 - 15.5 %   Platelets 305 150 - 400 K/uL   nRBC 0.0 0.0 - 0.2 %    Comment: Performed at [facility], [place] [address], [place], [place] [address]  Glucose, capillary     Status: None   Collection Time: 05/20/20  6:35 AM  Result Value Ref Range   Glucose-Capillary 88 70 - 99 mg/dL    Comment: Glucose reference range applies only to samples taken after fasting for at least 8 hours.   No results found.     Medical Problem List and Plan: 1.  Debility secondary to acute on chronic systolic congestive heart failure/cardiogenic shock/ischemic cardiomyopathy status post Impella insertion 05/03/2020 with removal of Impella assistive device 05/16/2020  -patient may shower  -ELOS/Goals: modI 5-7 days 2.  Antithrombotics: -DVT/anticoagulation: Eliquis  -antiplatelet therapy: N/A 3. Pain Management: Oxycodone as needed- has not used in 5 days, may d/c. Main source of pain is sacral pressure injury- offload at least q2H to prevent pain and worsening of injury.  4. Mood: Provide emotional support  -antipsychotic agents: N/A 5. Neuropsych: This patient is capable of making decisions on his own behalf. 6. Skin/Wound Care: Routine skin checks 7. Fluids/Electrolytes/Nutrition: Routine in and outs with  follow-up chemistries. Electrolytes stable on 12/20: monitor weekly. 8.  Atrial fibrillation/ischemic cardiomyopathy/chronic systolic congestive heart failure/history of PE.  Status post DCCV 05/10/2020.  Continue Lanoxin 0.125 mg daily, amiodarone 200 mg  daily, ProAmatine 2.5 mg 3 times daily, milrinone as directed, Aldactone 12.5 mg daily .  Followed closely by cardiology services. 9.  SLE.  Imuran 100 mg twice daily, Plaquenil 400 mg nightly.  Follow-up Dr. Gavin Pound rheumatology services. 10.  Hyperlipidemia.  Crestor 11.  BPH.  Flomax 0.4 mg daily.  Check PVR 12. Tachypneic to 23 on 12/20: monitor RR TID.   Lavon Paganini Angiulli, PA-C 05/20/2020   I have personally performed a face to face diagnostic evaluation, including, but not limited to relevant history and physical exam findings, of this patient and developed relevant assessment and plan.  Additionally, I have reviewed and concur with the physician assistant's documentation above.  Leeroy Cha, MD

## 2020-05-20 NOTE — Progress Notes (Addendum)
Inpatient Rehab Admissions Coordinator: .  Note pt ambulating 45' with PT and at least 300' with other staff throughout the day.  Discussed with Dr. Naaman Plummer, and feel pt no longer requires 3 hrs/day of intensive therapy.  Would defer to PT/OT for alternate f/u recommendations.  Will not be able to offer him a bed at this time.  Call with questions.   Shann Medal, PT, DPT Admissions Coordinator [phone] 05/20/20  1:38 PM

## 2020-05-21 LAB — BASIC METABOLIC PANEL
Anion gap: 8 (ref 5–15)
BUN: 27 mg/dL — ABNORMAL HIGH (ref 8–23)
CO2: 30 mmol/L (ref 22–32)
Calcium: 8.1 mg/dL — ABNORMAL LOW (ref 8.9–10.3)
Chloride: 101 mmol/L (ref 98–111)
Creatinine, Ser: 1.1 mg/dL (ref 0.61–1.24)
GFR, Estimated: >60 mL/min (ref >60)
Glucose, Bld: 107 mg/dL — ABNORMAL HIGH (ref 70–99)
Potassium: 4.1 mmol/L (ref 3.5–5.1)
Sodium: 139 mmol/L (ref 135–145)

## 2020-05-21 LAB — GLUCOSE, CAPILLARY
Glucose-Capillary: 89 mg/dL (ref 70–99)
Glucose-Capillary: 62 mg/dL — ABNORMAL LOW (ref 70–99)
Glucose-Capillary: 88 mg/dL (ref 70–99)
Glucose-Capillary: 115 mg/dL — ABNORMAL HIGH (ref 70–99)
Glucose-Capillary: 102 mg/dL — ABNORMAL HIGH (ref 70–99)
Glucose-Capillary: 122 mg/dL — ABNORMAL HIGH (ref 70–99)

## 2020-05-21 LAB — COOXEMETRY PANEL
Carboxyhemoglobin: 1.9 % — ABNORMAL HIGH (ref 0.5–1.5)
Methemoglobin: 0.9 % (ref 0.0–1.5)
O2 Saturation: 61.7 %
Total hemoglobin: 7.4 g/dL — ABNORMAL LOW (ref 12.0–16.0)

## 2020-05-21 LAB — CBC
HCT: 24.7 % — ABNORMAL LOW (ref 39.0–52.0)
Hemoglobin: 7.1 g/dL — ABNORMAL LOW (ref 13.0–17.0)
MCH: 27 pg (ref 26.0–34.0)
MCHC: 28.7 g/dL — ABNORMAL LOW (ref 30.0–36.0)
MCV: 93.9 fL (ref 80.0–100.0)
Platelets: 245 10*3/uL (ref 150–400)
RBC: 2.63 MIL/uL — ABNORMAL LOW (ref 4.22–5.81)
RDW: 20.4 % — ABNORMAL HIGH (ref 11.5–15.5)
WBC: 4.4 10*3/uL (ref 4.0–10.5)
nRBC: 0 % (ref 0.0–0.2)

## 2020-05-21 LAB — LACTATE DEHYDROGENASE: LDH: 186 U/L (ref 98–192)

## 2020-05-21 NOTE — Progress Notes (Signed)
Pt on room air and no Bipap in room. VS stable at this time. Will monitor.

## 2020-05-21 NOTE — Progress Notes (Signed)
Patient ID: Cody Arellano, male   DOB: Jun 27, 1945, 74 y.o.   MRN: [id number]     Advanced Heart Failure Rounding Note  PCP-Cardiologist: Mertie Moores, MD   Subjective:     Events: - admitted with cardiogenic shock/AF -  TEE-guided DCCV on 12/3. TEE with mildly dilated LV, EF 20%, mildly dilated RV with moderately decreased systolic function, moderate MR.   - Impella 5.5 placed on 11/30 with cardiogenic shock. Impella removed 12/13.  - LHC 12/1 showed patient LIMA-LAD, patent native LAD, patent dominant LCx.  The SVG-ramus is occluded with severe diffuse in-stent restenosis in proximal ramus.  Ramus not intervened upon as this would be a complex intervention and unlikely to markedly improve EF.  - VT 12/7 early am had DCCV, converted back to afib.  Later went into a junctional rhythm and amiodarone stopped, then converted to NSR.    Co-ox 62% on milrinone 0.125  Feels ok. More fatigued than yesterday. Appetite still sluggish. No orthopnea or PND.     Objective:   Weight Range: 60.1 kg Body mass index is 17.97 kg/m.   Vital Signs:   Temp:  [97.7 F (36.5 C)-98.1 F (36.7 C)] 98.1 F (36.7 C) (12/18 0810) Pulse Rate:  [55-68] 63 (12/18 0800) Resp:  [13-24] 21 (12/18 0800) BP: (86-123)/(44-79) 123/79 (12/18 0800) SpO2:  [91 %-100 %] 100 % (12/18 0800) Last BM Date: 05/20/20  Weight change: Filed Weights   05/18/20 0645 05/19/20 0600 05/20/20 0500  Weight: 60.3 kg 60.3 kg 60.1 kg    Intake/Output:   Intake/Output Summary (Last 24 hours) at 05/21/2020 1041 Last data filed at 05/21/2020 0900 Gross per 24 hour  Intake 425.3 ml  Output 2250 ml  Net -1824.7 ml      Physical Exam    General:  Elderly frail HEENT: normal Neck: supple. no JVD. Carotids 2+ bilat; no bruits. No lymphadenopathy or thryomegaly appreciated. Cor: PMI nondisplaced. Regular brady. No rubs, gallops or murmurs. Lungs: clear Abdomen: soft, nontender, nondistended. No hepatosplenomegaly. No  bruits or masses. Good bowel sounds. Extremities: no cyanosis, clubbing, rash, edema Neuro: alert & orientedx3, cranial nerves grossly intact. moves all 4 extremities w/o difficulty. Affect pleasant   Telemetry    Sinus 50-60s  Personally reviewed  Labs    CBC Recent Labs    05/20/20 0230 05/21/20 0256  WBC 5.1 4.4  HGB 8.7* 7.1*  HCT 30.1* 24.7*  MCV 93.8 93.9  PLT 305 884   Basic Metabolic Panel Recent Labs    05/20/20 0230 05/21/20 0256  NA 140 139  K 3.7 4.1  CL 99 101  CO2 30 30  GLUCOSE 99 107*  BUN 23 27*  CREATININE 0.91 1.10  CALCIUM 8.4* 8.1*   Liver Function Tests No results for input(s): AST, ALT, ALKPHOS, BILITOT, PROT, ALBUMIN in the last 72 hours. No results for input(s): LIPASE, AMYLASE in the last 72 hours. Cardiac Enzymes No results for input(s): CKTOTAL, CKMB, CKMBINDEX, TROPONINI in the last 72 hours.  BNP: BNP (last 3 results) Recent Labs    04/06/20 2134 04/26/20 1319 04/30/20 0248  BNP 1,677.6* 1,824.4* 1,685.5*    ProBNP (last 3 results) No results for input(s): PROBNP in the last 8760 hours.   D-Dimer No results for input(s): DDIMER in the last 72 hours. Hemoglobin A1C No results for input(s): HGBA1C in the last 72 hours. Fasting Lipid Panel No results for input(s): CHOL, HDL, LDLCALC, TRIG, CHOLHDL, LDLDIRECT in the last 72 hours. Thyroid Function Tests No  results for input(s): TSH, T4TOTAL, T3FREE, THYROIDAB in the last 72 hours.  Invalid input(s): FREET3  Other results:   Imaging    No results found.   Medications:     Scheduled Medications: . (feeding supplement) PROSource Plus  30 mL Oral BID BM  . sodium chloride   Intravenous Once  . amiodarone  200 mg Oral BID  . apixaban  5 mg Oral BID  . azaTHIOprine  100 mg Oral BID  . Chlorhexidine Gluconate Cloth  6 each Topical Daily  . dapagliflozin propanediol  10 mg Oral Daily  . digoxin  0.125 mg Oral Daily  . docusate  100 mg Oral BID  . docusate  sodium  100 mg Oral BID  . dronabinol  2.5 mg Oral BID AC  . feeding supplement  237 mL Oral TID BM  . hydroxychloroquine  400 mg Oral QHS  . midodrine  2.5 mg Oral TID WC  . multivitamin with minerals  1 tablet Oral Daily  . rosuvastatin  20 mg Oral Daily  . sodium chloride flush  10-40 mL Intracatheter Q12H  . sodium chloride flush  3 mL Intravenous Q12H  . spironolactone  12.5 mg Oral Daily  . tamsulosin  0.4 mg Oral Daily  . torsemide  20 mg Oral Daily    Infusions: . sodium chloride Stopped (05/06/20 0858)  . sodium chloride    . sodium chloride 250 mL (05/16/20 1541)  . sodium chloride Stopped (05/12/20 0445)  . dextrose Stopped (05/06/20 2218)  . lactated ringers Stopped (05/11/20 0800)  . milrinone 0.125 mcg/kg/min (05/21/20 0900)    PRN Medications: [CANCELED] Place/Maintain arterial line **AND** sodium chloride, sodium chloride, acetaminophen, bisacodyl, fentaNYL (SUBLIMAZE) injection, lidocaine, ondansetron (ZOFRAN) IV, oxyCODONE, phenol, polyethylene glycol, Resource ThickenUp Clear, sodium chloride flush, sodium chloride flush, white petrolatum   Assessment/Plan   1. 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  Echo this admission looks worse than 10/21 with EF 20-25% with septal akinesis, mid to apical inferior akinesis, apical anterior and apex akinesis, moderately dilated and dysfunctional RV, moderate TR, severe MR, bicuspid aortic valve with no stenosis, mild aortic insufficiency. Progressive/end-stage CHF worsened by onset of atrial fibrillation. Cardiogenic shock 11/30 with co-ox down to 30%, Impella 5.5 placed to allow time to stabilize and consider further steps.  Impella extracted 12/13. 0.125 milrinone added post explant for marginal co-ox at 51%. Co-ox up to 62% on milrinone. Volume status looks good  - continue milrinone 0.125 and follow co-ox. May re-attempt wean soon but I am nt in a rush to do this given his course - Off  losartan and spironolactone with low BP.  - Wil stop midodrine - Continue digoxin 0.125  - Continue Farxiga 10 mg  - Continue Torsemide 20 mg  - He has end-stage biventricular HF with cardiogenic shock physiology in the setting of AF/RVR and severe MR and advanced ischemic CM.  He now s/p Impella 5.5 to support DC-CV and medical optimization. Maintaining NSR. He is not a transplant candidate.  Barriers to LVAD include nutritional status/deconditioning and RV.  I think RV would be acceptable.  Nutritional status improving.  We discussed in [place], not LVAD candidate at this point with malnutrition/deconditioning.  Continue milrinone to allow time to improve nutritional status and mobility (hopefully to CIR).  - Palliative Care has seen and continue to discuss [place]. CAD: Prior history of MI with LAD and ramus PCI. Admitted May 2021 with anterior STEMI. LHC with occluded ostial LAD (in-stent),  90% ostial ramus, 60-70% proximal LCx, nondominant RCA. PTCA to LAD and ramus to restore flow, then CABG with LIMA-LAD and SVG-ramus.  No chest pain, doubt ACS.  Fall in EF from 10/21 to 11/21, but he did not present with ACS.  [place] 12/1 showed patient LIMA-LAD, patent native LAD, patent dominant LCx.  The SVG-ramus is occluded with severe diffuse in-stent restenosis in proximal ramus.  Ramus not intervened upon as this would be a complex intervention and unlikely to markedly improve EF. No s/s angina - Continue ASA 81 and statin.    3. AKI: Creatinine now trending down with improved cardiac output. Renal function normalized.  Cr 1.1 today 4. Right pleural effusion: S/p thoracentesis, transudative (CHF).  5. Atrial fibrillation/flutter with RVR: DCCV on 12/3, went back to NSR 12/7. Remains in NSR.  - Continue amiodarone at 200 mg bid, decrease to 200 mg daily in 4 more days.   - continue apixaban 5 mg bid  6. Mitral regurgitation:  TEE 12/3 with moderate central MR.  Doubt MitraClip will help him much with moderate  MR and he is likely too advanced.  7. H/o PE: In 10/21.  Keep anticoagulated, on apixaban.  8. Bicuspid aortic valve: Mild AI, no AS.  9. SLE: H/o pericarditis.  On Imuran and hydroxychloroquine.  - Restarted home SLE regimen now that he is taking po.   10. Pulmonary nodules/emphysema: CCM following, s/p bronch/BAL (no growth).  11. ID: Concern for right-sided PNA, ?aspiration.  Cultures NGTD.  Now afebrile and has completed course of vancomycin/cefepime.   - no change 12. Thrombocytopenia: Resolved.  May have been due to critical illness, low grade hemolysis with Impella. LDH ok.  HIT negative.  - Back on apixaban.  13. Hypokalemia/hypomag: Supplement as needed. K 4.1 today 14. Acute blood loss anemia: No obvious site.   - Transfuse hgb < 8.  15. VT: Episode early am 12/7, required DCCV.  No recurrence.  16. Deconditioning: - continue to mobilize  - CIR following appreciate their consideration. I suspect he would likely benefit greatly from their service. He is motivated to get stronger. D/w CIR team personally.   [facility] transfer [facility]   Length of Stay: [place], MD  05/21/2020, 10:41 AM  Advanced Heart Failure Team Pager [phone] (M-F; 7a - 4p)  Please contact [facility] for night-coverage after hours (4p -7a ) and weekends on [email or web address]

## 2020-05-22 LAB — CBC
HCT: 30.2 % — ABNORMAL LOW (ref 39.0–52.0)
Hemoglobin: 8.7 g/dL — ABNORMAL LOW (ref 13.0–17.0)
MCH: 27.1 pg (ref 26.0–34.0)
MCHC: 28.8 g/dL — ABNORMAL LOW (ref 30.0–36.0)
MCV: 94.1 fL (ref 80.0–100.0)
Platelets: 316 10*3/uL (ref 150–400)
RBC: 3.21 MIL/uL — ABNORMAL LOW (ref 4.22–5.81)
RDW: 20.4 % — ABNORMAL HIGH (ref 11.5–15.5)
WBC: 5.5 10*3/uL (ref 4.0–10.5)
nRBC: 0 % (ref 0.0–0.2)

## 2020-05-22 LAB — COOXEMETRY PANEL
Carboxyhemoglobin: 1.8 % — ABNORMAL HIGH (ref 0.5–1.5)
Carboxyhemoglobin: 1.7 % — ABNORMAL HIGH (ref 0.5–1.5)
Methemoglobin: 0.8 % (ref 0.0–1.5)
Methemoglobin: 0.7 % (ref 0.0–1.5)
O2 Saturation: 51.1 %
O2 Saturation: 48.2 %
Total hemoglobin: 8.9 g/dL — ABNORMAL LOW (ref 12.0–16.0)
Total hemoglobin: 9.1 g/dL — ABNORMAL LOW (ref 12.0–16.0)

## 2020-05-22 LAB — GLUCOSE, CAPILLARY
Glucose-Capillary: 86 mg/dL (ref 70–99)
Glucose-Capillary: 105 mg/dL — ABNORMAL HIGH (ref 70–99)
Glucose-Capillary: 147 mg/dL — ABNORMAL HIGH (ref 70–99)
Glucose-Capillary: 136 mg/dL — ABNORMAL HIGH (ref 70–99)

## 2020-05-22 LAB — BASIC METABOLIC PANEL
Anion gap: 9 (ref 5–15)
BUN: 26 mg/dL — ABNORMAL HIGH (ref 8–23)
CO2: 30 mmol/L (ref 22–32)
Calcium: 8.4 mg/dL — ABNORMAL LOW (ref 8.9–10.3)
Chloride: 99 mmol/L (ref 98–111)
Creatinine, Ser: 1.06 mg/dL (ref 0.61–1.24)
GFR, Estimated: >60 mL/min (ref >60)
Glucose, Bld: 96 mg/dL (ref 70–99)
Potassium: 4.2 mmol/L (ref 3.5–5.1)
Sodium: 138 mmol/L (ref 135–145)

## 2020-05-22 LAB — LACTATE DEHYDROGENASE: LDH: 146 U/L (ref 98–192)

## 2020-05-22 NOTE — Progress Notes (Signed)
Pt on room air and no Bipap in room. Vs stable and will continue bto monitor.

## 2020-05-22 NOTE — Progress Notes (Signed)
Patient ID: Cody Arellano, male   DOB: 11-Aug-1945, 74 y.o.   MRN: [id number]     Advanced Heart Failure Rounding Note  PCP-Cardiologist: Mertie Moores, MD   Subjective:     Events: - admitted with cardiogenic shock/AF -  TEE-guided DCCV on 12/3. TEE with mildly dilated LV, EF 20%, mildly dilated RV with moderately decreased systolic function, moderate MR.   - Impella 5.5 placed on 11/30 with cardiogenic shock. Impella removed 12/13.  - LHC 12/1 showed patient LIMA-LAD, patent native LAD, patent dominant LCx.  The SVG-ramus is occluded with severe diffuse in-stent restenosis in proximal ramus.  Ramus not intervened upon as this would be a complex intervention and unlikely to markedly improve EF.  - VT 12/7 early am had DCCV, converted back to afib.  Later went into a junctional rhythm and amiodarone stopped, then converted to NSR.    Co-ox 62% -> 51% on milrinone 0.125. CVP 4  Feels ok. Denies SOB, orthopnea or PND. Fatigued at times.    Objective:   Weight Range: 59.9 kg Body mass index is 17.91 kg/m.   Vital Signs:   Temp:  [97.5 F (36.4 C)-98.5 F (36.9 C)] 98.1 F (36.7 C) (12/19 0348) Pulse Rate:  [56-67] 59 (12/19 0910) Resp:  [16-20] 18 (12/18 2300) BP: (95-118)/(55-67) 99/63 (12/18 2300) SpO2:  [97 %-100 %] 98 % (12/18 2300) Weight:  [59.9 kg] 59.9 kg (12/19 0553) Last BM Date: 05/21/20  Weight change: Filed Weights   05/19/20 0600 05/20/20 0500 05/22/20 0553  Weight: 60.3 kg 60.1 kg 59.9 kg    Intake/Output:   Intake/Output Summary (Last 24 hours) at 05/22/2020 0918 Last data filed at 05/22/2020 0912 Gross per 24 hour  Intake 515.34 ml  Output 1725 ml  Net -1209.66 ml      Physical Exam    General:  Elderly frail HEENT: normal Neck: supple. no JVD. Carotids 2+ bilat; no bruits. No lymphadenopathy or thryomegaly appreciated. Cor: PMI nondisplaced. Regular rate & rhythm. No rubs, gallops or murmurs. Lungs: clear Abdomen: soft, nontender,  nondistended. No hepatosplenomegaly. No bruits or masses. Good bowel sounds. Extremities: no cyanosis, clubbing, rash, edema Neuro: alert & orientedx3, cranial nerves grossly intact. moves all 4 extremities w/o difficulty. Affect pleasant   Telemetry    Sinus 50-60s  Personally reviewed  Labs    CBC Recent Labs    05/21/20 0256 05/22/20 0335  WBC 4.4 5.5  HGB 7.1* 8.7*  HCT 24.7* 30.2*  MCV 93.9 94.1  PLT 245 970   Basic Metabolic Panel Recent Labs    05/21/20 0256 05/22/20 0335  NA 139 138  K 4.1 4.2  CL 101 99  CO2 30 30  GLUCOSE 107* 96  BUN 27* 26*  CREATININE 1.10 1.06  CALCIUM 8.1* 8.4*   Liver Function Tests No results for input(s): AST, ALT, ALKPHOS, BILITOT, PROT, ALBUMIN in the last 72 hours. No results for input(s): LIPASE, AMYLASE in the last 72 hours. Cardiac Enzymes No results for input(s): CKTOTAL, CKMB, CKMBINDEX, TROPONINI in the last 72 hours.  BNP: BNP (last 3 results) Recent Labs    04/06/20 2134 04/26/20 1319 04/30/20 0248  BNP 1,677.6* 1,824.4* 1,685.5*    ProBNP (last 3 results) No results for input(s): PROBNP in the last 8760 hours.   D-Dimer No results for input(s): DDIMER in the last 72 hours. Hemoglobin A1C No results for input(s): HGBA1C in the last 72 hours. Fasting Lipid Panel No results for input(s): CHOL, HDL, LDLCALC, TRIG, CHOLHDL,  LDLDIRECT in the last 72 hours. Thyroid Function Tests No results for input(s): TSH, T4TOTAL, T3FREE, THYROIDAB in the last 72 hours.  Invalid input(s): FREET3  Other results:   Imaging    No results found.   Medications:     Scheduled Medications: . (feeding supplement) PROSource Plus  30 mL Oral BID BM  . sodium chloride   Intravenous Once  . amiodarone  200 mg Oral BID  . apixaban  5 mg Oral BID  . azaTHIOprine  100 mg Oral BID  . Chlorhexidine Gluconate Cloth  6 each Topical Daily  . dapagliflozin propanediol  10 mg Oral Daily  . digoxin  0.125 mg Oral Daily  .  docusate  100 mg Oral BID  . docusate sodium  100 mg Oral BID  . dronabinol  2.5 mg Oral BID AC  . feeding supplement  237 mL Oral TID BM  . hydroxychloroquine  400 mg Oral QHS  . midodrine  2.5 mg Oral TID WC  . multivitamin with minerals  1 tablet Oral Daily  . rosuvastatin  20 mg Oral Daily  . sodium chloride flush  10-40 mL Intracatheter Q12H  . sodium chloride flush  3 mL Intravenous Q12H  . spironolactone  12.5 mg Oral Daily  . tamsulosin  0.4 mg Oral Daily  . torsemide  20 mg Oral Daily    Infusions: . sodium chloride Stopped (05/06/20 0858)  . sodium chloride    . sodium chloride 250 mL (05/16/20 1541)  . sodium chloride Stopped (05/12/20 0445)  . dextrose Stopped (05/06/20 2218)  . lactated ringers Stopped (05/11/20 0800)  . milrinone 0.125 mcg/kg/min (05/22/20 0400)    PRN Medications: [CANCELED] Place/Maintain arterial line **AND** sodium chloride, sodium chloride, acetaminophen, bisacodyl, fentaNYL (SUBLIMAZE) injection, lidocaine, ondansetron (ZOFRAN) IV, oxyCODONE, phenol, polyethylene glycol, Resource ThickenUp Clear, sodium chloride flush, sodium chloride flush, white petrolatum   Assessment/Plan   1. 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  Echo this admission looks worse than 10/21 with EF 20-25% with septal akinesis, mid to apical inferior akinesis, apical anterior and apex akinesis, moderately dilated and dysfunctional RV, moderate TR, severe MR, bicuspid aortic valve with no stenosis, mild aortic insufficiency. Progressive/end-stage CHF worsened by onset of atrial fibrillation. Cardiogenic shock 11/30 with co-ox down to 30%, Impella 5.5 placed to allow time to stabilize and consider further steps.  Impella extracted 12/13. 0.125 milrinone added post explant for marginal co-ox at 51%.Remains on milrinone. Coox back down to 51%.  - continue milrinone 0.125. repeat co-ox - CVP 4 today. Continue torsemide 20 daily. May need to hold.   - Off losartan and spironolactone with low BP.  - Off midodrine - Continue digoxin 0.125  - Continue Farxiga 10 mg  - He has end-stage biventricular HF with cardiogenic shock physiology in the setting of AF/RVR and severe MR and advanced ischemic CM.  He now s/p Impella 5.5 to support DC-CV and medical optimization. Maintaining NSR. He is not a transplant candidate.  Barriers to LVAD include nutritional status/deconditioning and RV.  I think RV would be acceptable.  Nutritional status improving.  We discussed in [place], not LVAD candidate at this point with malnutrition/deconditioning.  Continue milrinone to allow time to improve nutritional status and mobility (hopefully to CIR).  - Await to see if bed availability in CIR 2. CAD: Prior history of MI with LAD and ramus PCI. Admitted May 2021 with anterior STEMI. LHC with occluded ostial LAD (in-stent), 90% ostial ramus, 60-70% proximal LCx,  nondominant RCA. PTCA to LAD and ramus to restore flow, then CABG with LIMA-LAD and SVG-ramus.  No chest pain, doubt ACS.  Fall in EF from 10/21 to 11/21, but he did not present with ACS.  [place] 12/1 showed patient LIMA-LAD, patent native LAD, patent dominant LCx.  The SVG-ramus is occluded with severe diffuse in-stent restenosis in proximal ramus.  Ramus not intervened upon as this would be a complex intervention and unlikely to markedly improve EF. No s/s angina - Continue ASA 81 and statin.    3. AKI: Creatinine now trending down with improved cardiac output. Renal function normalized.  Cr 1.06 today 4. Right pleural effusion: S/p thoracentesis, transudative (CHF).  5. Atrial fibrillation/flutter with RVR: DCCV on 12/3, went back to NSR 12/7. Remains in NSR.  - Continue amiodarone at 200 mg bid, decrease to 200 mg tomorrow - continue apixaban 5 mg bid  6. Mitral regurgitation:  TEE 12/3 with moderate central MR.  Doubt MitraClip will help him much with moderate MR and he is likely too advanced.  7. H/o PE: In  10/21.  Keep anticoagulated, on apixaban. No bleeding  8. Bicuspid aortic valve: Mild AI, no AS.  9. SLE: H/o pericarditis.  On Imuran and hydroxychloroquine.  - Restarted home SLE regimen now that he is taking po.   10. Pulmonary nodules/emphysema: CCM following, s/p bronch/BAL (no growth).  11. ID: Concern for right-sided PNA, ?aspiration.  Cultures NGTD.  Now afebrile and has completed course of vancomycin/cefepime.   - no change 12. Thrombocytopenia: Resolved.  May have been due to critical illness, low grade hemolysis with Impella. LDH ok.  HIT negative.  - Back on apixaban.  13. Hypokalemia/hypomag: Supplement as needed. K 4.1 today 14. Acute blood loss anemia: No obvious site.   - Transfuse hgb < 8.  15. VT: Episode early am 12/7, required DCCV.  No recurrence.  16. Deconditioning: - continue to mobilize  - CIR following appreciate their consideration. I suspect he would likely benefit greatly from their service. He is motivated to get stronger. I spoke with Admission coordinator today personally    Length of Stay: [place], MD  05/22/2020, 9:18 AM  Advanced Heart Failure Team Pager [phone] (M-F; [place])  Please contact [facility] for night-coverage after hours (4p -7a ) and weekends on [email or web address]

## 2020-05-22 NOTE — Progress Notes (Signed)
Physical Therapy Treatment Patient Details Name: Cody Arellano MRN: [id number] DOB: [birth year] Today's Date: 05/22/2020    History of Present Illness Pt is a 74 y.o. male with medical history significant for lupus, interstitial lung disease, coronary artery disease, chronic systolic CHF, paroxysmal atrial fibrillation on Eliquis, and recent PE who presented to the emergency department due to shortness of breath that has been ongoing for about 2 weeks associated with nausea and intermittent nonbilious, nonbloody vomiting ~2-3 times per week. Pt had cardiogenic shock and impella placed 11/30; heart cath on 12/1. CXR 11/30: Marked worsening of airspace disease throughout the right chest most worrisome for pneumonia. CXR 12/1: Significant interval clearing of airspace opacity on the right as well as milder clearing on the left. No new opacity evident. CXR 12/2: Stable bibasilar subsegmental atelectasis. ETT 11/30-12/2.  Impella removed on 05/16/20.    PT Comments    Pt progressing well towards his physical therapy goals. Requiring up to min assist for functional mobility. Ambulating 400 feet with a walker, HR stable 59-62 bpm, SpO2 > 90% on RA, BP 109/62 pre mobility, 119/68 post mobility. Pt demonstrates gait speed of 1.17 ft/s, indicating he is still at a high fall risk. Also demonstrates static/dynamic balance deficits, weakness, gait abnormalities, and decreased endurance. Continue to recommend intensive CIR level therapies to address deficits and maximize functional independence.     Follow Up Recommendations  CIR;Supervision/Assistance - 24 hour     Equipment Recommendations  Rolling walker with 5" wheels;3in1 (PT)    Recommendations for Other Services       Precautions / Restrictions Precautions Precautions: Fall Restrictions Weight Bearing Restrictions: No    Mobility  Bed Mobility Overal bed mobility: Modified Independent             General bed mobility comments: HOB flat,  increased time  Transfers Overall transfer level: Needs assistance Equipment used: Rolling walker (2 wheeled) Transfers: Sit to/from Stand Sit to Stand: Min guard            Ambulation/Gait Ambulation/Gait assistance: Herbalist (Feet): 400 Feet Assistive device: Rolling walker (2 wheeled) Gait Pattern/deviations: Decreased stride length;Narrow base of support;Step-through pattern;Scissoring Gait velocity: 1.17 ft/s Gait velocity interpretation: <1.8 ft/sec, indicate of risk for recurrent falls General Gait Details: Pt with one posterior LOB requiring minA to correct, 2 instances of running into obstacles requiring verbal cueing to correct.   Stairs             Wheelchair Mobility    Modified Rankin (Stroke Patients Only)       Balance Overall balance assessment: Needs assistance   Sitting balance-Leahy Scale: Good     Standing balance support: Bilateral upper extremity supported;During functional activity Standing balance-Leahy Scale: Poor Standing balance comment: relies on UE support from RW                            Cognition Arousal/Alertness: Awake/alert Behavior During Therapy: Flat affect Overall Cognitive Status: Impaired/Different from baseline Area of Impairment: Safety/judgement                         Safety/Judgement: Decreased awareness of safety;Decreased awareness of deficits     General Comments: pt with decreased deficit and safety awareness, unable to gauge increasing weakness and take/request rest break independently      Exercises General Exercises - Lower Extremity Long Arc Quad: AROM;Strengthening;Both;Seated;10 reps Hip Flexion/Marching: AROM;Both;10 reps;Seated  General Comments        Pertinent Vitals/Pain Pain Assessment: Faces Faces Pain Scale: No hurt    Home Living                      Prior Function            PT Goals (current goals can now be found in the  care plan section) Acute Rehab PT Goals Patient Stated Goal: be able to take care of myself and spend time with my family PT Goal Formulation: With patient Time For Goal Achievement: 06/02/20 Potential to Achieve Goals: Good Progress towards PT goals: Progressing toward goals    Frequency    Min 3X/week      PT Plan Current plan remains appropriate    Co-evaluation              AM-PAC PT "6 Clicks" Mobility   Outcome Measure  Help needed turning from your back to your side while in a flat bed without using bedrails?: None Help needed moving from lying on your back to sitting on the side of a flat bed without using bedrails?: None Help needed moving to and from a bed to a chair (including a wheelchair)?: A Little Help needed standing up from a chair using your arms (e.g., wheelchair or bedside chair)?: A Little Help needed to walk in hospital room?: A Little Help needed climbing 3-5 steps with a railing? : A Lot 6 Click Score: 19    End of Session Equipment Utilized During Treatment: Gait belt Activity Tolerance: Patient tolerated treatment well Patient left: with call bell/phone within reach;in chair Nurse Communication: Mobility status PT Visit Diagnosis: Muscle weakness (generalized) (M62.81);Difficulty in walking, not elsewhere classified (R26.2)     Time: 1884-1660 PT Time Calculation (min) (ACUTE ONLY): 27 min  Charges:  $Therapeutic Activity: 23-37 mins                     Wyona Almas, PT, DPT Acute Rehabilitation Services Pager [phone] Office [phone]    Deno Etienne 05/22/2020, 9:19 AM

## 2020-05-22 NOTE — Progress Notes (Signed)
Inpatient Rehab Admissions Coordinator:  Saw pt at bedside. Informed pt there are no beds available in CIR today.  Will continue to follow.   Gayland Curry, [place], [place] Admissions Coordinator [phone]

## 2020-05-23 ENCOUNTER — Inpatient Hospital Stay (HOSPITAL_COMMUNITY)
Admission: RE | Admit: 2020-05-23 | Discharge: 2020-05-31 | DRG: 945 | Disposition: A | Discharge status: Other Acute Inpatient Hospital | Payer: Medicare Other | Source: Intra-hospital | Attending: Physical Medicine and Rehabilitation | Admitting: Physical Medicine and Rehabilitation

## 2020-05-23 ENCOUNTER — Other Ambulatory Visit: Payer: Self-pay

## 2020-05-23 DIAGNOSIS — M329 Systemic lupus erythematosus, unspecified: Secondary | ICD-10-CM | POA: Diagnosis present

## 2020-05-23 DIAGNOSIS — I2510 Atherosclerotic heart disease of native coronary artery without angina pectoris: Secondary | ICD-10-CM | POA: Diagnosis present

## 2020-05-23 DIAGNOSIS — R5381 Other malaise: Secondary | ICD-10-CM | POA: Diagnosis present

## 2020-05-23 DIAGNOSIS — I252 Old myocardial infarction: Secondary | ICD-10-CM

## 2020-05-23 DIAGNOSIS — D62 Acute posthemorrhagic anemia: Secondary | ICD-10-CM | POA: Diagnosis present

## 2020-05-23 DIAGNOSIS — L89152 Pressure ulcer of sacral region, stage 2: Secondary | ICD-10-CM | POA: Diagnosis present

## 2020-05-23 DIAGNOSIS — F32A Depression, unspecified: Secondary | ICD-10-CM | POA: Diagnosis present

## 2020-05-23 DIAGNOSIS — J849 Interstitial pulmonary disease, unspecified: Secondary | ICD-10-CM | POA: Diagnosis present

## 2020-05-23 DIAGNOSIS — R570 Cardiogenic shock: Secondary | ICD-10-CM | POA: Diagnosis present

## 2020-05-23 DIAGNOSIS — I255 Ischemic cardiomyopathy: Secondary | ICD-10-CM | POA: Diagnosis present

## 2020-05-23 DIAGNOSIS — K5901 Slow transit constipation: Secondary | ICD-10-CM | POA: Diagnosis not present

## 2020-05-23 DIAGNOSIS — Z951 Presence of aortocoronary bypass graft: Secondary | ICD-10-CM

## 2020-05-23 DIAGNOSIS — Z515 Encounter for palliative care: Secondary | ICD-10-CM

## 2020-05-23 DIAGNOSIS — Z86711 Personal history of pulmonary embolism: Secondary | ICD-10-CM

## 2020-05-23 DIAGNOSIS — R0682 Tachypnea, not elsewhere classified: Secondary | ICD-10-CM | POA: Diagnosis present

## 2020-05-23 DIAGNOSIS — L8990 Pressure ulcer of unspecified site, unspecified stage: Secondary | ICD-10-CM | POA: Insufficient documentation

## 2020-05-23 DIAGNOSIS — M533 Sacrococcygeal disorders, not elsewhere classified: Secondary | ICD-10-CM | POA: Diagnosis not present

## 2020-05-23 DIAGNOSIS — I5023 Acute on chronic systolic (congestive) heart failure: Secondary | ICD-10-CM | POA: Diagnosis present

## 2020-05-23 DIAGNOSIS — E785 Hyperlipidemia, unspecified: Secondary | ICD-10-CM | POA: Diagnosis present

## 2020-05-23 DIAGNOSIS — J439 Emphysema, unspecified: Secondary | ICD-10-CM | POA: Diagnosis present

## 2020-05-23 DIAGNOSIS — R64 Cachexia: Secondary | ICD-10-CM | POA: Diagnosis present

## 2020-05-23 DIAGNOSIS — N179 Acute kidney failure, unspecified: Secondary | ICD-10-CM | POA: Diagnosis present

## 2020-05-23 DIAGNOSIS — F419 Anxiety disorder, unspecified: Secondary | ICD-10-CM | POA: Diagnosis present

## 2020-05-23 DIAGNOSIS — I351 Nonrheumatic aortic (valve) insufficiency: Secondary | ICD-10-CM | POA: Diagnosis not present

## 2020-05-23 DIAGNOSIS — N401 Enlarged prostate with lower urinary tract symptoms: Secondary | ICD-10-CM | POA: Diagnosis present

## 2020-05-23 DIAGNOSIS — E43 Unspecified severe protein-calorie malnutrition: Secondary | ICD-10-CM | POA: Diagnosis present

## 2020-05-23 DIAGNOSIS — I110 Hypertensive heart disease with heart failure: Secondary | ICD-10-CM | POA: Diagnosis present

## 2020-05-23 DIAGNOSIS — I5084 End stage heart failure: Secondary | ICD-10-CM | POA: Diagnosis present

## 2020-05-23 DIAGNOSIS — Z681 Body mass index (BMI) 19 or less, adult: Secondary | ICD-10-CM

## 2020-05-23 DIAGNOSIS — Z8249 Family history of ischemic heart disease and other diseases of the circulatory system: Secondary | ICD-10-CM

## 2020-05-23 DIAGNOSIS — Z7189 Other specified counseling: Secondary | ICD-10-CM

## 2020-05-23 DIAGNOSIS — Z95811 Presence of heart assist device: Secondary | ICD-10-CM | POA: Diagnosis not present

## 2020-05-23 DIAGNOSIS — E876 Hypokalemia: Secondary | ICD-10-CM | POA: Diagnosis present

## 2020-05-23 DIAGNOSIS — I5043 Acute on chronic combined systolic (congestive) and diastolic (congestive) heart failure: Secondary | ICD-10-CM | POA: Diagnosis not present

## 2020-05-23 DIAGNOSIS — Z79899 Other long term (current) drug therapy: Secondary | ICD-10-CM

## 2020-05-23 DIAGNOSIS — I480 Paroxysmal atrial fibrillation: Secondary | ICD-10-CM | POA: Diagnosis present

## 2020-05-23 DIAGNOSIS — Z87891 Personal history of nicotine dependence: Secondary | ICD-10-CM

## 2020-05-23 DIAGNOSIS — Z7901 Long term (current) use of anticoagulants: Secondary | ICD-10-CM

## 2020-05-23 DIAGNOSIS — L89159 Pressure ulcer of sacral region, unspecified stage: Secondary | ICD-10-CM | POA: Diagnosis present

## 2020-05-23 DIAGNOSIS — I5022 Chronic systolic (congestive) heart failure: Secondary | ICD-10-CM | POA: Diagnosis not present

## 2020-05-23 DIAGNOSIS — I462 Cardiac arrest due to underlying cardiac condition: Secondary | ICD-10-CM | POA: Diagnosis not present

## 2020-05-23 DIAGNOSIS — I469 Cardiac arrest, cause unspecified: Secondary | ICD-10-CM | POA: Diagnosis not present

## 2020-05-23 DIAGNOSIS — Z7982 Long term (current) use of aspirin: Secondary | ICD-10-CM

## 2020-05-23 DIAGNOSIS — R531 Weakness: Secondary | ICD-10-CM | POA: Diagnosis not present

## 2020-05-23 DIAGNOSIS — N400 Enlarged prostate without lower urinary tract symptoms: Secondary | ICD-10-CM | POA: Diagnosis present

## 2020-05-23 DIAGNOSIS — I083 Combined rheumatic disorders of mitral, aortic and tricuspid valves: Secondary | ICD-10-CM | POA: Diagnosis present

## 2020-05-23 DIAGNOSIS — Z955 Presence of coronary angioplasty implant and graft: Secondary | ICD-10-CM

## 2020-05-23 DIAGNOSIS — R627 Adult failure to thrive: Secondary | ICD-10-CM | POA: Diagnosis not present

## 2020-05-23 DIAGNOSIS — Z83438 Family history of other disorder of lipoprotein metabolism and other lipidemia: Secondary | ICD-10-CM

## 2020-05-23 DIAGNOSIS — I5082 Biventricular heart failure: Secondary | ICD-10-CM | POA: Diagnosis present

## 2020-05-23 LAB — CBC
HCT: 31 % — ABNORMAL LOW (ref 39.0–52.0)
Hemoglobin: 8.9 g/dL — ABNORMAL LOW (ref 13.0–17.0)
MCH: 26.9 pg (ref 26.0–34.0)
MCHC: 28.7 g/dL — ABNORMAL LOW (ref 30.0–36.0)
MCV: 93.7 fL (ref 80.0–100.0)
Platelets: 310 10*3/uL (ref 150–400)
RBC: 3.31 MIL/uL — ABNORMAL LOW (ref 4.22–5.81)
RDW: 20.4 % — ABNORMAL HIGH (ref 11.5–15.5)
WBC: 5.7 10*3/uL (ref 4.0–10.5)
nRBC: 0 % (ref 0.0–0.2)

## 2020-05-23 LAB — COOXEMETRY PANEL
Carboxyhemoglobin: 1.7 % — ABNORMAL HIGH (ref 0.5–1.5)
Methemoglobin: 0.6 % (ref 0.0–1.5)
O2 Saturation: 51.2 %
Total hemoglobin: 10.1 g/dL — ABNORMAL LOW (ref 12.0–16.0)

## 2020-05-23 LAB — GLUCOSE, CAPILLARY
Glucose-Capillary: 96 mg/dL (ref 70–99)
Glucose-Capillary: 123 mg/dL — ABNORMAL HIGH (ref 70–99)
Glucose-Capillary: 107 mg/dL — ABNORMAL HIGH (ref 70–99)

## 2020-05-23 LAB — BASIC METABOLIC PANEL
Anion gap: 10 (ref 5–15)
BUN: 27 mg/dL — ABNORMAL HIGH (ref 8–23)
CO2: 31 mmol/L (ref 22–32)
Calcium: 8.3 mg/dL — ABNORMAL LOW (ref 8.9–10.3)
Chloride: 99 mmol/L (ref 98–111)
Creatinine, Ser: 1.04 mg/dL (ref 0.61–1.24)
GFR, Estimated: >60 mL/min (ref >60)
Glucose, Bld: 97 mg/dL (ref 70–99)
Potassium: 4.2 mmol/L (ref 3.5–5.1)
Sodium: 140 mmol/L (ref 135–145)

## 2020-05-23 LAB — LACTATE DEHYDROGENASE: LDH: 145 U/L (ref 98–192)

## 2020-05-23 MED ORDER — WHITE PETROLATUM EX OINT: 1.0000 "application " | TOPICAL_OINTMENT | CUTANEOUS | Status: DC | PRN

## 2020-05-23 MED ORDER — AMIODARONE HCL 200 MG PO TABS: 200.0000 mg | ORAL_TABLET | Freq: Every day | ORAL | Status: DC

## 2020-05-23 MED ORDER — AZATHIOPRINE 50 MG PO TABS
100.0000 mg | ORAL_TABLET | Freq: Two times a day (BID) | ORAL | Status: DC
  Administered 2020-05-23 – 2020-05-31 (×16): 100 mg via ORAL
  Filled 2020-05-23 (×17): qty 2

## 2020-05-23 MED ORDER — BISACODYL 10 MG RE SUPP: 10.0000 mg | Freq: Every day | RECTAL | 0 refills | Status: DC | PRN

## 2020-05-23 MED ORDER — TORSEMIDE 20 MG PO TABS: 20.0000 mg | ORAL_TABLET | Freq: Every day | ORAL | Status: AC

## 2020-05-23 MED ORDER — HYDROXYCHLOROQUINE SULFATE 200 MG PO TABS
400.0000 mg | ORAL_TABLET | Freq: Every day | ORAL | Status: DC
  Administered 2020-05-23 – 2020-05-30 (×8): 400 mg via ORAL
  Filled 2020-05-23 (×9): qty 2

## 2020-05-23 MED ORDER — ONDANSETRON HCL 4 MG/2ML IJ SOLN: 4.0000 mg | Freq: Four times a day (QID) | INTRAMUSCULAR | 0 refills | Status: DC | PRN

## 2020-05-23 MED ORDER — APIXABAN 5 MG PO TABS
5.0000 mg | ORAL_TABLET | Freq: Two times a day (BID) | ORAL | Status: DC
  Administered 2020-05-23 – 2020-05-31 (×16): 5 mg via ORAL
  Filled 2020-05-23 (×16): qty 1

## 2020-05-23 MED ORDER — MIDODRINE HCL 5 MG PO TABS
2.5000 mg | ORAL_TABLET | Freq: Three times a day (TID) | ORAL | Status: DC
  Administered 2020-05-23 – 2020-05-30 (×21): 2.5 mg via ORAL
  Filled 2020-05-23 (×21): qty 1

## 2020-05-23 MED ORDER — SPIRONOLACTONE 12.5 MG HALF TABLET
12.5000 mg | ORAL_TABLET | Freq: Every day | ORAL | Status: DC
  Administered 2020-05-24 – 2020-05-27 (×4): 12.5 mg via ORAL
  Filled 2020-05-23 (×4): qty 1

## 2020-05-23 MED ORDER — MILRINONE LACTATE IN DEXTROSE 20-5 MG/100ML-% IV SOLN
0.1250 ug/kg/min | INTRAVENOUS | Status: DC
  Administered 2020-05-23 – 2020-05-30 (×5): 0.125 ug/kg/min via INTRAVENOUS
  Filled 2020-05-23 (×5): qty 100

## 2020-05-23 MED ORDER — TAMSULOSIN HCL 0.4 MG PO CAPS
0.4000 mg | ORAL_CAPSULE | Freq: Every day | ORAL | Status: DC
  Administered 2020-05-24 – 2020-05-31 (×8): 0.4 mg via ORAL
  Filled 2020-05-23 (×8): qty 1

## 2020-05-23 MED ORDER — DRONABINOL 2.5 MG PO CAPS
2.5000 mg | ORAL_CAPSULE | Freq: Two times a day (BID) | ORAL | Status: DC
  Administered 2020-05-23 – 2020-05-24 (×3): 2.5 mg via ORAL
  Filled 2020-05-23 (×3): qty 1

## 2020-05-23 MED ORDER — ENSURE ENLIVE PO LIQD: 237.0000 mL | Freq: Three times a day (TID) | ORAL | 12 refills | Status: DC

## 2020-05-23 MED ORDER — ASPIRIN EC 81 MG PO TBEC
81.0000 mg | DELAYED_RELEASE_TABLET | Freq: Every day | ORAL | Status: DC
  Administered 2020-05-24 – 2020-05-31 (×8): 81 mg via ORAL
  Filled 2020-05-23 (×8): qty 1

## 2020-05-23 MED ORDER — PROSOURCE PLUS PO LIQD: 30.0000 mL | Freq: Two times a day (BID) | ORAL | Status: DC

## 2020-05-23 MED ORDER — MIDODRINE HCL 2.5 MG PO TABS: 2.5000 mg | ORAL_TABLET | Freq: Three times a day (TID) | ORAL | Status: DC

## 2020-05-23 MED ORDER — ACETAMINOPHEN 325 MG PO TABS
650.0000 mg | ORAL_TABLET | ORAL | Status: DC | PRN
  Administered 2020-05-27: 650 mg via ORAL
  Filled 2020-05-23: qty 2

## 2020-05-23 MED ORDER — TORSEMIDE 20 MG PO TABS
20.0000 mg | ORAL_TABLET | Freq: Every day | ORAL | Status: DC
  Administered 2020-05-24 – 2020-05-31 (×8): 20 mg via ORAL
  Filled 2020-05-23 (×8): qty 1

## 2020-05-23 MED ORDER — MILRINONE LACTATE IN DEXTROSE 20-5 MG/100ML-% IV SOLN: 0.2500 ug/kg/min | INTRAVENOUS | Status: DC

## 2020-05-23 MED ORDER — OXYCODONE HCL 5 MG PO TABS: 5.0000 mg | ORAL_TABLET | Freq: Four times a day (QID) | ORAL | Status: DC | PRN

## 2020-05-23 MED ORDER — MILRINONE LACTATE IN DEXTROSE 20-5 MG/100ML-% IV SOLN: 0.1250 ug/kg/min | INTRAVENOUS | 0 refills | Status: DC

## 2020-05-23 MED ORDER — DAPAGLIFLOZIN PROPANEDIOL 10 MG PO TABS: 10.0000 mg | ORAL_TABLET | Freq: Every day | ORAL | Status: DC

## 2020-05-23 MED ORDER — ENSURE ENLIVE PO LIQD
237.0000 mL | Freq: Three times a day (TID) | ORAL | Status: DC
  Administered 2020-05-23 – 2020-05-31 (×23): 237 mL via ORAL
  Filled 2020-05-23: qty 237

## 2020-05-23 MED ORDER — BISACODYL 10 MG RE SUPP
10.0000 mg | Freq: Every day | RECTAL | Status: DC | PRN
  Administered 2020-05-27: 01:00:00 10 mg via RECTAL
  Filled 2020-05-23: qty 1

## 2020-05-23 MED ORDER — AMIODARONE HCL 200 MG PO TABS
200.0000 mg | ORAL_TABLET | Freq: Every day | ORAL | Status: DC
  Administered 2020-05-24 – 2020-05-31 (×8): 200 mg via ORAL
  Filled 2020-05-23 (×8): qty 1

## 2020-05-23 MED ORDER — DOCUSATE SODIUM 50 MG/5ML PO LIQD: 100.0000 mg | Freq: Two times a day (BID) | ORAL | 0 refills | Status: DC

## 2020-05-23 MED ORDER — DOCUSATE SODIUM 100 MG PO CAPS: 100.0000 mg | ORAL_CAPSULE | Freq: Two times a day (BID) | ORAL | 0 refills | Status: AC

## 2020-05-23 MED ORDER — DRONABINOL 2.5 MG PO CAPS: 2.5000 mg | ORAL_CAPSULE | Freq: Two times a day (BID) | ORAL | Status: DC

## 2020-05-23 MED ORDER — DIGOXIN 125 MCG PO TABS
0.1250 mg | ORAL_TABLET | Freq: Every day | ORAL | Status: DC
  Administered 2020-05-24 – 2020-05-31 (×8): 0.125 mg via ORAL
  Filled 2020-05-23 (×8): qty 1

## 2020-05-23 MED ORDER — DAPAGLIFLOZIN PROPANEDIOL 10 MG PO TABS
10.0000 mg | ORAL_TABLET | Freq: Every day | ORAL | Status: DC
  Administered 2020-05-24 – 2020-05-31 (×8): 10 mg via ORAL
  Filled 2020-05-23 (×8): qty 1

## 2020-05-23 MED ORDER — PROSOURCE PLUS PO LIQD
30.0000 mL | Freq: Two times a day (BID) | ORAL | Status: DC
  Administered 2020-05-24 – 2020-05-31 (×16): 30 mL via ORAL
  Filled 2020-05-23 (×15): qty 30

## 2020-05-23 MED ORDER — DOCUSATE SODIUM 100 MG PO CAPS
100.0000 mg | ORAL_CAPSULE | Freq: Two times a day (BID) | ORAL | Status: DC
  Administered 2020-05-23 – 2020-05-27 (×9): 100 mg via ORAL
  Filled 2020-05-23 (×9): qty 1

## 2020-05-23 MED ORDER — SPIRONOLACTONE 25 MG PO TABS: 12.5000 mg | ORAL_TABLET | Freq: Every day | ORAL | Status: DC

## 2020-05-23 MED ORDER — POLYETHYLENE GLYCOL 3350 17 G PO PACK
17.0000 g | PACK | Freq: Every day | ORAL | Status: DC | PRN
  Administered 2020-05-29 – 2020-05-30 (×2): 17 g via ORAL
  Filled 2020-05-23 (×2): qty 1

## 2020-05-23 MED ORDER — TORSEMIDE 20 MG PO TABS
20.0000 mg | ORAL_TABLET | Freq: Every day | ORAL | Status: DC
  Administered 2020-05-23: 13:00:00 20 mg via ORAL
  Filled 2020-05-23: qty 1

## 2020-05-23 MED ORDER — ADULT MULTIVITAMIN W/MINERALS CH
1.0000 | ORAL_TABLET | Freq: Every day | ORAL | Status: DC
  Administered 2020-05-24 – 2020-05-31 (×8): 1 via ORAL
  Filled 2020-05-23 (×8): qty 1

## 2020-05-23 MED ORDER — DIGOXIN 125 MCG PO TABS: 0.1250 mg | ORAL_TABLET | Freq: Every day | ORAL | Status: DC

## 2020-05-23 MED ORDER — POLYVINYL ALCOHOL 1.4 % OP SOLN
2.0000 [drp] | Freq: Every day | OPHTHALMIC | Status: DC
  Administered 2020-05-23 – 2020-05-31 (×9): 2 [drp] via OPHTHALMIC
  Filled 2020-05-23: qty 15

## 2020-05-23 MED ORDER — ROSUVASTATIN CALCIUM 20 MG PO TABS
20.0000 mg | ORAL_TABLET | Freq: Every day | ORAL | Status: DC
  Administered 2020-05-24 – 2020-05-31 (×8): 20 mg via ORAL
  Filled 2020-05-23 (×8): qty 1

## 2020-05-23 MED ORDER — ADULT MULTIVITAMIN W/MINERALS CH: 1.0000 | ORAL_TABLET | Freq: Every day | ORAL | Status: AC

## 2020-05-23 NOTE — TOC Transition Note (Signed)
Transition of Care [facility]) - CM/SW Discharge Note   Patient Details  Name: [name] MRN: [id number] Date of Birth: Apr 13, 1946  Transition of Care [facility]) CM/SW Contact:  Zenon Mayo, RN Phone Number: 05/23/2020, 11:21 AM   Clinical Narrative:    Patient to dc to CIR today.   Final next level of care: IP Rehab Facility Barriers to Discharge: No Barriers Identified   Patient Goals and CMS Choice Patient states their goals for this hospitalization and ongoing recovery are:: CIR today      Discharge Placement                       Discharge Plan and Services     Post Acute Care Choice: IP Rehab                               Social Determinants of Health (SDOH) Interventions     Readmission Risk Interventions Readmission Risk Prevention Plan 05/20/2020 03/25/2020  Transportation Screening Complete Complete  PCP or Specialist Appt within 3-5 Days - Complete  HRI or [place] - Complete  Social Work Consult for [place] Planning/Counseling - Complete  Palliative Care Screening - Not Applicable  Medication Review Press photographer) Complete Complete  HRI or Home Care Consult Complete -  SW Recovery Care/Counseling Consult Complete -  Palliative Care Screening Complete -  [place] Not Applicable -  Some recent data might be hidden

## 2020-05-23 NOTE — Progress Notes (Signed)
Occupational Therapy Treatment Patient Details Name: Cody Arellano MRN: [id number] DOB: 17-Oct-1945 Today's Date: 05/23/2020    History of present illness Pt is a 74 y.o. male with medical history significant for lupus, interstitial lung disease, coronary artery disease, chronic systolic CHF, paroxysmal atrial fibrillation on Eliquis, and recent PE who presented to the emergency department due to shortness of breath that has been ongoing for about 2 weeks associated with nausea and intermittent nonbilious, nonbloody vomiting ~2-3 times per week. Pt had cardiogenic shock and impella placed 11/30; heart cath on 12/1. CXR 11/30: Marked worsening of airspace disease throughout the right chest most worrisome for pneumonia. CXR 12/1: Significant interval clearing of airspace opacity on the right as well as milder clearing on the left. No new opacity evident. CXR 12/2: Stable bibasilar subsegmental atelectasis. ETT 11/30-12/2.  Impella removed on 05/16/20.   OT comments  Pt received in bed, agreeable to OT session. Pt progressing toward established OT goals. He currently requires minA for functional mobility at RW level. He required minA for grooming at sink level while standing. Pt demonstrates decreased activity tolerance and decreased self-awareness requiring multimodal cues for initiating rest breaks. Pt will continue to benefit from skilled OT services to maximize safety and independence with ADL/IADL and functional mobility. Will continue to follow acutely and progress as tolerated.    Follow Up Recommendations  CIR;Supervision/Assistance - 24 hour    Equipment Recommendations  3 in 1 bedside commode;Wheelchair (measurements OT);Wheelchair cushion (measurements OT)    Recommendations for Other Services PT consult    Precautions / Restrictions Precautions Precautions: Fall Restrictions Weight Bearing Restrictions: No       Mobility Bed Mobility Overal bed mobility: Modified Independent Bed  Mobility: Supine to Sit;Sit to Supine     Supine to sit: Modified independent (Device/Increase time);HOB elevated Sit to supine: Modified independent (Device/Increase time);HOB elevated   General bed mobility comments: increased effort, HOB elevated, pt utilized rail  Transfers Overall transfer level: Needs assistance Equipment used: Rolling walker (2 wheeled) Transfers: Sit to/from Stand Sit to Stand: Min assist         General transfer comment: minA for stability and safety with standing from EOB    Balance Overall balance assessment: Needs assistance Sitting-balance support: Feet supported;No upper extremity supported Sitting balance-Leahy Scale: Good Sitting balance - Comments: pt min guard for sitting EOB Postural control: Posterior lean Standing balance support: During functional activity;Single extremity supported Standing balance-Leahy Scale: Poor Standing balance comment: relies on UE support from RW;reliant on forearm support at sink level during grooming                           ADL either performed or assessed with clinical judgement   ADL Overall ADL's : Needs assistance/impaired     Grooming: Wash/dry hands;Wash/dry face;Oral care;Min guard;Standing Grooming Details (indicate cue type and reason): completed at sink level                 Toilet Transfer: Minimal assistance;RW;Ambulation Armed forces technical officer Details (indicate cue type and reason): minA for safety and stability with ambulation;simulated with in room mobility Toileting- Clothing Manipulation and Hygiene: Minimal assistance;Sit to/from stand Toileting - Clothing Manipulation Details (indicate cue type and reason): minA for stability in standing     Functional mobility during ADLs: Minimal assistance;Rolling walker General ADL Comments: pt with limited activity tolerance, requires cues for taking rest breaks     Vision  Perception     Praxis      Cognition  Arousal/Alertness: Awake/alert Behavior During Therapy: Flat affect Overall Cognitive Status: Impaired/Different from baseline Area of Impairment: Safety/judgement                         Safety/Judgement: Decreased awareness of safety;Decreased awareness of deficits     General Comments: decreased self awareness with level of fatigue and when to initiate rest breaks, pt required cues to take rest break during grooming at sink        Exercises     Shoulder Instructions       General Comments HR 59-65bpm    Pertinent Vitals/ Pain       Pain Assessment: No/denies pain Faces Pain Scale: No hurt Pain Intervention(s): Monitored during session  Home Living                                          Prior Functioning/Environment              Frequency  Min 2X/week        Progress Toward Goals  OT Goals(current goals can now be found in the care plan section)  Progress towards OT goals: Progressing toward goals  Acute Rehab OT Goals Patient Stated Goal: be able to take care of myself and spend time with my family OT Goal Formulation: With patient Time For Goal Achievement: 06/03/20 Potential to Achieve Goals: Fair ADL Goals Pt Will Perform Grooming: with set-up;standing Pt Will Perform Upper Body Bathing: with set-up;standing Pt Will Perform Lower Body Bathing: with supervision;sit to/from stand Pt Will Perform Upper Body Dressing: with set-up;standing;sitting Pt Will Perform Lower Body Dressing: with supervision;sit to/from stand Pt Will Transfer to Toilet: with supervision;ambulating;regular height toilet  Plan Discharge plan remains appropriate    Co-evaluation                 AM-PAC OT "6 Clicks" Daily Activity     Outcome Measure   Help from another person eating meals?: None Help from another person taking care of personal grooming?: A Little Help from another person toileting, which includes using toliet, bedpan, or  urinal?: A Little Help from another person bathing (including washing, rinsing, drying)?: A Little Help from another person to put on and taking off regular upper body clothing?: A Little Help from another person to put on and taking off regular lower body clothing?: A Little 6 Click Score: 19    End of Session Equipment Utilized During Treatment: Rolling walker  OT Visit Diagnosis: Other abnormalities of gait and mobility (R26.89);Muscle weakness (generalized) (M62.81);Pain Pain - Right/Left: Right Pain - part of body: Arm   Activity Tolerance Patient tolerated treatment well   Patient Left with call bell/phone within reach;in bed;with bed alarm set   Nurse Communication Mobility status        Time: 7412-8786 OT Time Calculation (min): 15 min  Charges: OT General Charges $OT Visit: 1 Visit OT Treatments $Self Care/Home Management : 8-22 mins  Helene Kelp OTR/L Acute Rehabilitation Services Office: [place] 05/23/2020, 1:30 PM

## 2020-05-23 NOTE — H&P (Signed)
Physical Medicine and Rehabilitation Admission H&P  CC: Cardiopulmonary debility   HPI: Cody Arellano is a 74 year old right-handed male with history significant for lupus diagnosed in 2014 maintained on Imuran as well as Plaquenil followed by Dr. Gavin Pound, interstitial lung disease, CAD/CABG May 7425, chronic systolic congestive heart failure, PAF on Eliquis and recent PE followed by cardiology services Dr. Acie Fredrickson.  Per chart review patient lives with spouse.  Independent prior to admission.  Two-level home bed and bath upstairs.  Family with good support.  Presented 04/26/2020 with increasing shortness of breath x2 weeks with associated nausea and intermittent nonbilious nonbloody vomiting approximately 2-3 times per week.  Shortness of breath worsens with exertion and states could barely walk 10 feet without becoming short of breath and also reports 20 pound unintentional weight loss since May 2021.  Patient was recently admitted 4/21 due to acute on chronic systolic congestive heart failure.  In the ED patient was tachypneic, creatinine 1.45 from baseline 1.0-1.2, elevated BNP 1824.  Echocardiogram with ejection fraction of 20 to 95% grade 3 diastolic dysfunction with septal akinesis as well as biventricular dysfunction along with severe mitral regurgitation.  CT of the chest with contrast showed poor opacification of bilateral lower lobe pulmonary arteries.  CT angiogram of the chest showed large right-sided pleural effusion.  There is also a small left-sided pleural effusion.  Patient did receive IV Lasix as well as placed on intravenous heparin followed by cardiology services as well as initiation of milrinone.  Critical care pulmonary services Dr. Valeta Harms consulted underwent flexible video fiberoptic bronchoscopy 04/29/2020 with biopsies that was unremarkable.  Follow-up CVTS with noted concern for low cardiac output and echocardiogram worsening since recent study patient underwent Impella  insertion 05/03/2020 per Dr. 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  Hospital course underwent DCCV 05/10/2020 due to ongoing V. tach converted back to A. fib.  Later went into junctional rhythm amiodarone adjusted TEE completed showing mildly dilated LV, EF 20% mildly dilated RV with moderately decreased systolic function, moderate MR.  He did remain in normal sinus rhythm after cardiac adjustments and currently remains on amiodarone as directed.  Acute on chronic anemia latest hemoglobin 8.9.  Renal function remained stable latest creatinine 1.04.  Palliative care has been consulted to establish goals of care.  Tolerating a regular consistency diet.  Due to patient generalized decline debility related to congestive heart failure multimedical he was admitted for a comprehensive rehab program.    Review of Systems  Constitutional: Positive for weight loss.  HENT: Negative for hearing loss.   Eyes: Negative for blurred vision and double vision.  Respiratory: Positive for cough and shortness of breath.   Cardiovascular: Positive for palpitations and leg swelling. Negative for chest pain.  Gastrointestinal: Positive for constipation. Negative for heartburn, nausea and vomiting.  Genitourinary: Positive for urgency. Negative for dysuria, flank pain and hematuria.  Musculoskeletal: Positive for joint pain and myalgias.  Skin: Negative for rash.  Neurological: Positive for headaches.  Psychiatric/Behavioral:       Anxiety  All other systems reviewed and are negative.  Past Medical History:  Diagnosis Date  . Anxiety   . CHF (congestive heart failure) ([place])   . Chronic tension headache   . Colon polyps   . Coronary artery disease 2008/2009   MI with PCI x 2, then PCI x 1 in 2009  . Depression   . Dyslipidemia   . Dysrhythmia    atrial fibrillation  .  Hyperlipidemia   . Hypertension   . Lupus [facility])    sees Dr Trudie Reed  . Lupus disease of the lung   . 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 Myocardial infarction ([place]) 2008  . 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 Shortness of breath    Past Surgical History:  Procedure Laterality Date  . BRONCHIAL WASHINGS  04/29/2020   Procedure: BRONCHIAL WASHINGS;  Surgeon: Garner Nash, DO;  Location: [place] [facility];  Service: Pulmonary;;  . CARDIAC CATHETERIZATION    . CLIPPING OF ATRIAL APPENDAGE N/A 10/17/2019   Procedure: Clipping Of Atrial Appendage using AtriCure [id number] 45 MM AtriClip.;  Surgeon: Rexene Alberts, MD;  Location: [place];  Service: Open Heart Surgery;  Laterality: N/A;  . CORONARY ARTERY BYPASS GRAFT N/A 10/17/2019   Procedure: CORONARY ARTERY BYPASS GRAFTING (CABG) using LIMA to LAD; Endoscopic harvest right greater saphenous vein: SVG to RAMUS.;  Surgeon: Rexene Alberts, MD;  Location: [place];  Service: Open Heart Surgery;  Laterality: N/A;  . CORONARY BALLOON ANGIOPLASTY N/A 10/17/2019   Procedure: CORONARY BALLOON ANGIOPLASTY;  Surgeon: Nelva Bush, MD;  Location: [place] [facility];  Service: Cardiovascular;  Laterality: N/A;  . coronary stents  2009  . CORONARY/GRAFT ACUTE MI REVASCULARIZATION N/A 10/17/2019   Procedure: Coronary/Graft Acute MI Revascularization;  Surgeon: Nelva Bush, MD;  Location: [place] [facility];  Service: Cardiovascular;  Laterality: N/A;  . CORONARY/GRAFT ANGIOGRAPHY N/A 05/04/2020   Procedure: CORONARY/GRAFT ANGIOGRAPHY;  Surgeon: Larey Dresser, MD;  Location: [place] [facility];  Service: Cardiovascular;  Laterality: N/A;  . ENDOVEIN HARVEST OF GREATER SAPHENOUS VEIN Right 10/17/2019   Procedure: [place] Of Greater Saphenous Vein;  Surgeon: Rexene Alberts, MD;  Location: [place];  Service: Open Heart Surgery;  Laterality: Right;  . IABP INSERTION N/A 10/17/2019   Procedure: IABP Insertion;  Surgeon: Nelva Bush, MD;  Location: [place] [facility];  Service: Cardiovascular;   Laterality: N/A;  . INCISION AND DRAINAGE ABSCESS N/A 01/29/2020   Procedure: INCISION AND DRAINAGE BILATERAL PERIRECTAL ABSCESS;  Surgeon: Stark Klein, MD;  Location: [place];  Service: General;  Laterality: N/A;  . IR THORACENTESIS ASP PLEURAL SPACE W/IMG GUIDE  10/26/2019  . IR THORACENTESIS ASP PLEURAL SPACE W/IMG GUIDE  04/27/2020  . LEFT HEART CATH AND CORS/GRAFTS ANGIOGRAPHY N/A 05/04/2020   Procedure: LEFT HEART CATH AND CORS/GRAFTS ANGIOGRAPHY;  Surgeon: Larey Dresser, MD;  Location: [place] [facility];  Service: Cardiovascular;  Laterality: N/A;  . PLACEMENT OF IMPELLA LEFT VENTRICULAR ASSIST DEVICE Right 05/03/2020   Procedure: PLACEMENT OF IMPELLA 5.5 LEFT VENTRICULAR ASSIST DEVICE VIA RIGHT AXILLARY;  Surgeon: [name], MD;  Location: [place];  Service: Open Heart Surgery;  Laterality: Right;  . REMOVAL OF IMPELLA LEFT VENTRICULAR ASSIST DEVICE N/A 05/16/2020   Procedure: REMOVAL OF IMPELLA LEFT VENTRICULAR ASSIST DEVICE;  Surgeon: Ivin Poot, MD;  Location: [place];  Service: Open Heart Surgery;  Laterality: N/A;  . RIGHT/LEFT HEART CATH AND CORONARY ANGIOGRAPHY N/A 10/17/2019   Procedure: RIGHT/LEFT HEART CATH AND CORONARY ANGIOGRAPHY;  Surgeon: Nelva Bush, MD;  Location: [place] [facility];  Service: Cardiovascular;  Laterality: N/A;  . TEE WITHOUT CARDIOVERSION  10/17/2019   Procedure: Transesophageal Echocardiogram (Tee);  Surgeon: Rexene Alberts, MD;  [facility];  Service: Open Heart Surgery;;  . TEE WITHOUT CARDIOVERSION N/A 05/03/2020   Procedure: TRANSESOPHAGEAL  ECHOCARDIOGRAM (TEE);  Surgeon: [name], MD;  Location: [place];  Service: Open Heart Surgery;  Laterality: N/A;  . TEE WITHOUT CARDIOVERSION N/A 05/06/2020   Procedure: TRANSESOPHAGEAL ECHOCARDIOGRAM (TEE);  Surgeon: Larey Dresser, MD;  Location: [facility];  Service: Cardiovascular;  Laterality: N/A;  bedside  . VIDEO BRONCHOSCOPY N/A 04/29/2020   Procedure: VIDEO BRONCHOSCOPY  WITHOUT FLUORO;  Surgeon: Garner Nash, DO;  Location: [place];  Service: Pulmonary;  Laterality: N/A;   Family History  Problem Relation Age of Onset  . Heart disease Mother   . Alcohol abuse Father   . Hyperlipidemia Sister   . Hypertension Sister   . Hyperlipidemia Brother   . Hypertension Brother   . Cancer Brother        ?lung cancer  . Stomach cancer Brother   . Diabetes Paternal Uncle   . Colon cancer Neg Hx   . Esophageal cancer Neg Hx   . Rectal cancer Neg Hx    Social History:  reports that he quit smoking about 27 years ago. His smoking use included cigarettes. He has a 20.00 pack-year smoking history. He has never used smokeless tobacco. He reports that he does not drink alcohol and does not use drugs. Allergies: No Known Allergies Medications Prior to Admission  Medication Sig Dispense Refill  . amiodarone (PACERONE) 200 MG tablet Take 1 tablet (200 mg total) by mouth daily.    [name] apixaban (ELIQUIS) 5 MG TABS tablet Take 1 tablet (5 mg total) by mouth 2 (two) times daily. 60 tablet 0  . aspirin EC 81 MG tablet Take 1 tablet (81 mg total) by mouth daily.    [name] azaTHIOprine (IMURAN) 50 MG tablet Take 100 mg by mouth 2 (two) times daily.   2  . bisacodyl (DULCOLAX) 10 MG suppository Place 1 suppository (10 mg total) rectally daily as needed for moderate constipation (If miralax is ineffective for moderate constipation). 12 suppository 0  . [START ON 05/24/2020] dapagliflozin propanediol (FARXIGA) 10 MG TABS tablet Take 1 tablet (10 mg total) by mouth daily. 30 tablet   . [START ON 05/24/2020] digoxin (LANOXIN) 0.125 MG tablet Take 1 tablet (0.125 mg total) by mouth daily.    [name] docusate (COLACE) 50 MG/5ML liquid Take 10 mLs (100 mg total) by mouth 2 (two) times daily. 100 mL 0  . docusate sodium (COLACE) 100 MG capsule Take 1 capsule (100 mg total) by mouth 2 (two) times daily. 10 capsule 0  . dronabinol (MARINOL) 2.5 MG capsule Take 1 capsule (2.5 mg total) by mouth 2  (two) times daily before lunch and supper.    . feeding supplement (ENSURE ENLIVE / ENSURE PLUS) LIQD Take 237 mLs by mouth 3 (three) times daily between meals. 237 mL 12  . hydroxychloroquine (PLAQUENIL) 200 MG tablet Take 400 mg by mouth at bedtime.     . midodrine (PROAMATINE) 2.5 MG tablet Take 1 tablet (2.5 mg total) by mouth 3 (three) times daily with meals.    . milrinone (PRIMACOR) 20 MG/100 ML SOLN infusion Inject 0.008 mg/min into the vein continuous. 100 mL 0  . [START ON 05/24/2020] Multiple Vitamin (MULTIVITAMIN WITH MINERALS) TABS tablet Take 1 tablet by mouth daily.    . Nutritional Supplements (,FEEDING SUPPLEMENT, PROSOURCE PLUS) liquid Take 30 mLs by mouth 2 (two) times daily between meals.    . ondansetron (ZOFRAN) 4 MG/2ML SOLN injection Inject 2 mLs (4 mg total) into the vein every 6 (six) hours as needed for  nausea. 2 mL 0  . oxyCODONE (OXY IR/ROXICODONE) 5 MG immediate release tablet Take 1 tablet (5 mg total) by mouth every 6 (six) hours as needed for severe pain. 30 tablet 0  . polyethylene glycol (MIRALAX / GLYCOLAX) 17 g packet Take 17 g by mouth daily as needed for moderate constipation.     . polyvinyl alcohol (ARTIFICIAL TEARS) 1.4 % ophthalmic solution Place 2 drops into both eyes daily.     . rosuvastatin (CRESTOR) 20 MG tablet Take 20 mg by mouth daily.    Derrill Memo ON 05/24/2020] spironolactone (ALDACTONE) 25 MG tablet Take 0.5 tablets (12.5 mg total) by mouth daily.    . tamsulosin (FLOMAX) 0.4 MG CAPS capsule Take 0.4 mg by mouth every evening.     . torsemide (DEMADEX) 20 MG tablet Take 1 tablet (20 mg total) by mouth daily.      Drug Regimen Review Drug regimen was reviewed and remains appropriate with no significant issues identified  Home: Home Living Family/patient expects to be discharged to:: Private residence Living Arrangements: Spouse/significant other,Children Available Help at Discharge: Family,Available 24 hours/day Type of Home: House Home  Access: Stairs to enter [employer] of Steps: 1 Home Layout: Two level,Bed/bath upstairs Alternate Level Stairs-Number of Steps: flight Alternate Level Stairs-Rails: Left Bathroom Shower/Tub: Multimedia programmer: Standard Bathroom Accessibility: Yes Home Equipment: None,Shower seat   Functional History: Prior Function Level of Independence: Independent  Functional Status:  Mobility: Bed Mobility Overal bed mobility: Needs Assistance Bed Mobility: Supine to Sit Rolling: Min assist Sidelying to sit: Min assist,+2 for physical assistance Supine to sit: HOB elevated,Min assist Sit to supine: Min assist,+2 for safety/equipment General bed mobility comments: In chair on arrival. Transfers Overall transfer level: Needs assistance Equipment used: Rolling walker (2 wheeled) Transfers: Sit to/from Stand Sit to Stand: Min assist Stand pivot transfers: +2 safety/equipment,Min assist General transfer comment: min assist to power up Ambulation/Gait Ambulation/Gait assistance: Min assist,Min guard Gait Distance (Feet): 490 Feet Assistive device: Rolling walker (2 wheeled) Gait Pattern/deviations: Drifts right/left,Decreased stride length,Narrow base of support,Step-through pattern General Gait Details: vc for sequencing especially with turning corners, and for increased base of support, pt requires 2-3x standing rest breaks dictated by therapy due to decreasing BoS likely due to hip weakness.  Pt progressing daily. Gait velocity: decreased Gait velocity interpretation: <1.31 ft/sec, indicative of household ambulator  ADL: ADL Overall ADL's : Needs assistance/impaired Eating/Feeding: Set up,Sitting Eating/Feeding Details (indicate cue type and reason): Cortrak to be placed Grooming: Wash/dry hands,Wash/dry face,Set up,Sitting,Oral care Grooming Details (indicate cue type and reason): Items placed outside reach to encourage leaning forward and increase trunk  strength. Upper Body Bathing: Moderate assistance,Bed level Lower Body Bathing: Maximal assistance,Bed level Upper Body Dressing : Maximal assistance,Bed level Lower Body Dressing: Maximal assistance,Sitting/lateral leans Lower Body Dressing Details (indicate cue type and reason): socks placed to mid foot, patient encouraged to bring feet closer to him while leaning forward to comfort. Toilet Transfer: Moderate assistance,+2 for safety/equipment,+2 for physical assistance,Ambulation,RW (simualted to recliner) Toilet Transfer Details (indicate cue type and reason): Mod A +2 for power up and gain balance. Functional mobility during ADLs: Minimal assistance,+2 for physical assistance,+2 for safety/equipment,Rolling walker,Moderate assistance General ADL Comments: Patient noted to walk 300' this am with staff.  Cognition: Cognition Overall Cognitive Status: Impaired/Different from baseline Orientation Level: Oriented X4 Cognition Arousal/Alertness: Awake/alert Behavior During Therapy: WFL for tasks assessed/performed,Flat affect Overall Cognitive Status: Impaired/Different from baseline Area of Impairment: Safety/judgement Safety/Judgement: Decreased awareness of safety,Decreased awareness  of deficits General Comments: pt with decreased deficit and safety awareness, unable to gauge increasing weakness and take/request rest break independently   Physical Exam: Blood pressure (!) 98/53, pulse (!) 57, temperature 97.9 F (36.6 C), resp. rate 18, SpO2 99 %. Physical Exam General: Alert and oriented x 3, No apparent distress HEENT: Head is normocephalic, atraumatic, PERRLA, EOMI, sclera anicteric, oral mucosa pink and moist, dentition intact, ext ear canals clear,  Neck: Supple without JVD or lymphadenopathy Heart: Reg rate and rhythm. +murmur Chest: Tachypneic Abdomen: Soft, non-tender, non-distended, bowel sounds positive. Extremities: No clubbing, cyanosis, or edema. Pulses are  2+ Skin: Clean and intact without signs of breakdown Neuro: Pt is cognitively appropriate with normal insight, memory, and awareness. Cranial nerves 2-12 are intact. Patient is alert sitting up in bed.  Oriented x3 and follows commands. No tremors. Motor function is grossly 4/5.  Musculoskeletal: Full ROM, No pain with AROM or PROM in the neck, trunk, or extremities. Posture appropriate Psych: Pt's affect is appropriate. Pt is cooperative   Results for orders placed or performed during the hospital encounter of 05/23/20 (from the past 48 hour(s))  Glucose, capillary     Status: Abnormal   Collection Time: 05/23/20  4:24 PM  Result Value Ref Range   Glucose-Capillary 107 (H) 70 - 99 mg/dL    Comment: Glucose reference range applies only to samples taken after fasting for at least 8 hours.   No results found.  Medical Problem List and Plan: 1.  Debility secondary to acute on chronic systolic congestive heart failure/cardiogenic shock/ischemic cardiomyopathy status post Impella insertion 05/03/2020 with removal of Impella assistive device 05/16/2020  -patient may shower  -ELOS/Goals: modI 5-7 days 2.  Antithrombotics: -DVT/anticoagulation: Eliquis  -antiplatelet therapy: N/A 3. Pain Management: Oxycodone as needed- has not used in 5 days, may d/c. Main source of pain is sacral pressure injury- offload at least q2H to prevent pain and worsening of injury.  4. Mood: Provide emotional support  -antipsychotic agents: N/A 5. Neuropsych: This patient is capable of making decisions on his own behalf. 6. Skin/Wound Care: Routine skin checks 7. Fluids/Electrolytes/Nutrition: Routine in and outs with follow-up chemistries. Electrolytes stable on 12/20: monitor weekly. 8.  Atrial fibrillation/ischemic cardiomyopathy/chronic systolic congestive heart failure/history of PE.  Status post DCCV 05/10/2020.  Continue Lanoxin 0.125 mg daily, amiodarone 200 mg  daily, ProAmatine 2.5 mg 3 times daily, milrinone  as directed, Aldactone 12.5 mg daily .  Followed closely by cardiology services. 9.  SLE.  Imuran 100 mg twice daily, Plaquenil 400 mg nightly.  Follow-up Dr. Gavin Pound rheumatology services. 10.  Hyperlipidemia.  Crestor 11.  BPH.  Flomax 0.4 mg daily.  Check PVR 12. Tachypneic to 23 on 12/20: monitor RR TID.   Lavon Paganini Angiulli, PA-C 05/20/2020   I have personally performed a face to face diagnostic evaluation, including, but not limited to relevant history and physical exam findings, of this patient and developed relevant assessment and plan.  Additionally, I have reviewed and concur with the physician assistant's documentation above.  Leeroy Cha, MD

## 2020-05-23 NOTE — Progress Notes (Signed)
Inpatient Rehabilitation Medication Review by a Pharmacist  A complete drug regimen review was completed for this patient to identify any potential clinically significant medication issues.  Clinically significant medication issues were identified:  Yes   Type of Medication Issue Identified Description of Issue Urgent (address now) Non-Urgent (address on AM team rounds) Plan Plan Accepted by Provider? (Yes / No / Pending AM Rounds)  Drug Interaction(s) (clinically significant)       Duplicate Therapy       Allergy       No Medication Administration End Date       Incorrect Dose       Additional Drug Therapy Needed  Milrinone, torsemide, ASA were listed on discharge summary, but were not ordered on admission to CIR Urgent (milrinone) Spoke with Marlowe Shores, PA, who gave phone orders for all these medications Yes  Other         Name of provider notified for urgent issues identified:  Marlowe Shores, PA  Provider Method of Notification:  Telephone  For non-urgent medication issues to be resolved on team rounds tomorrow morning a [facility] Secure Chat Handoff was sent to: N/A  Time spent performing this drug regimen review (minutes):  [place], PharmD, BCPS, [facility] Clinical Pharmacist 05/23/2020 5:06 PM

## 2020-05-23 NOTE — Progress Notes (Signed)
Pt admitted to room 4M10. Oriented to floor, call bell and rehab fall policy. Denies any pain or discomfort at this time. Pt resting in bed with all needs within reach.   Gerald Stabs, RN

## 2020-05-23 NOTE — Progress Notes (Signed)
Inpatient Rehabilitation  Patient information reviewed and entered into eRehab system by Tony Granquist M. Dalayza Zambrana, M.A., CCC/SLP, PPS Coordinator.  Information including medical coding, functional ability and quality indicators will be reviewed and updated through discharge.    

## 2020-05-23 NOTE — Progress Notes (Addendum)
Patient ID: Cody Arellano, male   DOB: [birth year], 74 y.o.   MRN: [id number]     Advanced Heart Failure Rounding Note  PCP-Cardiologist: Mertie Moores, MD   Subjective:    Events: - admitted with cardiogenic shock/AF -  TEE-guided DCCV on 12/3. TEE with mildly dilated LV, EF 20%, mildly dilated RV with moderately decreased systolic function, moderate MR.   - Impella 5.5 placed on 11/30 with cardiogenic shock. Impella removed 12/13.  - LHC 12/1 showed patient LIMA-LAD, patent native LAD, patent dominant LCx.  The SVG-ramus is occluded with severe diffuse in-stent restenosis in proximal ramus.  Ramus not intervened upon as this would be a complex intervention and unlikely to markedly improve EF.  - VT 12/7 early am had DCCV, converted back to afib.  Later went into a junctional rhythm and amiodarone stopped, then converted to NSR.    Co-ox 51% on milrinone 0.125. CVP 8-9. Wt down 1 lb from yesterday.  SCr 1.04.   Ate most of his breakfast this am. Transferring to CIR today.    Objective:   Weight Range: 59.6 kg Body mass index is 17.82 kg/m.   Vital Signs:   Temp:  [97.9 F (36.6 C)-98.1 F (36.7 C)] 98 F (36.7 C) (12/20 0700) Pulse Rate:  [55-58] 58 (12/20 0948) Resp:  [17-21] 19 (12/20 0700) BP: (97-111)/(50-66) 111/66 (12/20 0700) SpO2:  [96 %-100 %] 100 % (12/20 0700) Weight:  [59.6 kg] 59.6 kg (12/20 0500) Last BM Date: 05/21/20  Weight change: Filed Weights   05/20/20 0500 05/22/20 0553 05/23/20 0500  Weight: 60.1 kg 59.9 kg 59.6 kg    Intake/Output:   Intake/Output Summary (Last 24 hours) at 05/23/2020 1032 Last data filed at 05/23/2020 0900 Gross per 24 hour  Intake 310.97 ml  Output 1450 ml  Net -1139.03 ml      Physical Exam    CVP 8-9  General:  fraill appearing elderly AAM. No respiratory difficulty HEENT: normal Neck: supple.  JVD ~8 cm. Carotids 2+ bilat; no bruits. No lymphadenopathy or thyromegaly appreciated. Cor: PMI nondisplaced. Regular  rate & rhythm. No rubs or gallops + 3/6 MR murmur at apex  Lungs: clear. No wheezing  Abdomen: soft, nontender, nondistended. No hepatosplenomegaly. No bruits or masses. Good bowel sounds. Extremities: thin extremities no cyanosis, clubbing, rash, edema Neuro: alert & oriented x 3, cranial nerves grossly intact. moves all 4 extremities w/o difficulty. Affect pleasant.  Telemetry    Sinus bradycardia, upper 50s   Personally reviewed  Labs    CBC Recent Labs    05/22/20 0335 05/23/20 0330  WBC 5.5 5.7  HGB 8.7* 8.9*  HCT 30.2* 31.0*  MCV 94.1 93.7  PLT 316 710   Basic Metabolic Panel Recent Labs    05/22/20 0335 05/23/20 0330  NA 138 140  K 4.2 4.2  CL 99 99  CO2 30 31  GLUCOSE 96 97  BUN 26* 27*  CREATININE 1.06 1.04  CALCIUM 8.4* 8.3*   Liver Function Tests No results for input(s): AST, ALT, ALKPHOS, BILITOT, PROT, ALBUMIN in the last 72 hours. No results for input(s): LIPASE, AMYLASE in the last 72 hours. Cardiac Enzymes No results for input(s): CKTOTAL, CKMB, CKMBINDEX, TROPONINI in the last 72 hours.  BNP: BNP (last 3 results) Recent Labs    04/06/20 2134 04/26/20 1319 04/30/20 0248  BNP 1,677.6* 1,824.4* 1,685.5*    ProBNP (last 3 results) No results for input(s): PROBNP in the last 8760 hours.   D-Dimer No  results for input(s): DDIMER in the last 72 hours. Hemoglobin A1C No results for input(s): HGBA1C in the last 72 hours. Fasting Lipid Panel No results for input(s): CHOL, HDL, LDLCALC, TRIG, CHOLHDL, LDLDIRECT in the last 72 hours. Thyroid Function Tests No results for input(s): TSH, T4TOTAL, T3FREE, THYROIDAB in the last 72 hours.  Invalid input(s): FREET3  Other results:   Imaging    No results found.   Medications:     Scheduled Medications: . (feeding supplement) PROSource Plus  30 mL Oral BID BM  . sodium chloride   Intravenous Once  . amiodarone  200 mg Oral BID  . apixaban  5 mg Oral BID  . azaTHIOprine  100 mg Oral  BID  . Chlorhexidine Gluconate Cloth  6 each Topical Daily  . dapagliflozin propanediol  10 mg Oral Daily  . digoxin  0.125 mg Oral Daily  . docusate  100 mg Oral BID  . docusate sodium  100 mg Oral BID  . dronabinol  2.5 mg Oral BID AC  . feeding supplement  237 mL Oral TID BM  . hydroxychloroquine  400 mg Oral QHS  . midodrine  2.5 mg Oral TID WC  . multivitamin with minerals  1 tablet Oral Daily  . rosuvastatin  20 mg Oral Daily  . sodium chloride flush  10-40 mL Intracatheter Q12H  . sodium chloride flush  3 mL Intravenous Q12H  . spironolactone  12.5 mg Oral Daily  . tamsulosin  0.4 mg Oral Daily    Infusions: . sodium chloride Stopped (05/06/20 0858)  . sodium chloride 10 mL/hr at 05/22/20 1424  . sodium chloride 250 mL (05/16/20 1541)  . sodium chloride Stopped (05/12/20 0445)  . dextrose Stopped (05/06/20 2218)  . lactated ringers Stopped (05/11/20 0800)  . milrinone 0.125 mcg/kg/min (05/23/20 0000)    PRN Medications: [CANCELED] Place/Maintain arterial line **AND** sodium chloride, sodium chloride, acetaminophen, bisacodyl, fentaNYL (SUBLIMAZE) injection, lidocaine, ondansetron (ZOFRAN) IV, oxyCODONE, phenol, polyethylene glycol, Resource ThickenUp Clear, sodium chloride flush, sodium chloride flush, white petrolatum   Assessment/Plan   1. 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  Echo this admission looks worse than 10/21 with EF 20-25% with septal akinesis, mid to apical inferior akinesis, apical anterior and apex akinesis, moderately dilated and dysfunctional RV, moderate TR, severe MR, bicuspid aortic valve with no stenosis, mild aortic insufficiency. Progressive/end-stage CHF worsened by onset of atrial fibrillation. Cardiogenic shock 11/30 with co-ox down to 30%, Impella 5.5 placed to allow time to stabilize and consider further steps.  Impella extracted 12/13. 0.125 milrinone added post explant for marginal co-ox at 51%. Remains on  milrinone 0.125. Co-ox back down to 51% today  - continue milrinone 0.125.  - CVP 8-9 today. Continue torsemide 20 daily.  - Off losartan with low BP.  - Cont midodrine 2.5 tid - Continue Spiro 12.5 daily  - Continue digoxin 0.125  - Continue Farxiga 10 mg  - He has end-stage biventricular HF with cardiogenic shock physiology in the setting of AF/RVR and severe MR and advanced ischemic CM.  He now s/p Impella 5.5 to support DC-CV and medical optimization. Maintaining NSR. He is not a transplant candidate.  Barriers to LVAD include nutritional status/deconditioning and RV.  I think RV would be acceptable.  Nutritional status improving.  We discussed in [place], not LVAD candidate at this point with malnutrition/deconditioning.  Continue milrinone to allow time to improve nutritional status and mobility. Transfer to CIR today  2. CAD: Prior history of  MI with LAD and ramus PCI. Admitted May 2021 with anterior STEMI. LHC with occluded ostial LAD (in-stent), 90% ostial ramus, 60-70% proximal LCx, nondominant RCA. PTCA to LAD and ramus to restore flow, then CABG with LIMA-LAD and SVG-ramus.  No chest pain, doubt ACS.  Fall in EF from 10/21 to 11/21, but he did not present with ACS.  [place] 12/1 showed patient LIMA-LAD, patent native LAD, patent dominant LCx.  The SVG-ramus is occluded with severe diffuse in-stent restenosis in proximal ramus.  Ramus not intervened upon as this would be a complex intervention and unlikely to markedly improve EF. No s/s angina - Continue ASA 81 and statin.    3. AKI: Creatinine now trending down with improved cardiac output. Renal function normalized.  Cr 1.04 today 4. Right pleural effusion: S/p thoracentesis, transudative (CHF).  5. Atrial fibrillation/flutter with RVR: DCCV on 12/3, went back to NSR 12/7. Remains in NSR.  - Decrease amiodarone to 200 mg once daily  - continue apixaban 5 mg bid  6. Mitral regurgitation:  TEE 12/3 with moderate central MR.  Doubt MitraClip will  help him much with moderate MR and he is likely too advanced.  7. H/o PE: In 10/21.  Keep anticoagulated, on apixaban. No bleeding  8. Bicuspid aortic valve: Mild AI, no AS.  9. SLE: H/o pericarditis.  On Imuran and hydroxychloroquine.  - Restarted home SLE regimen now that he is taking po.   10. Pulmonary nodules/emphysema: CCM following, s/p bronch/BAL (no growth).  11. ID: Concern for right-sided PNA, ?aspiration.  Cultures NGTD.  Now afebrile and has completed course of vancomycin/cefepime.   - no change 12. Thrombocytopenia: Resolved.  May have been due to critical illness, low grade hemolysis with Impella. LDH ok.  HIT negative.  - Back on apixaban.  13. Hypokalemia/hypomag: Supplement as needed. K 4.2 today 14. Acute blood loss anemia: No obvious site.   - Hgb now stable and up-trending, 8.9 today - Transfuse hgb < 8.  15. VT: Episode early am 12/7, required DCCV.  No recurrence.  16. Deconditioning: - continue to mobilize  - Transfer to CIR today. Appreciate their assistance    Length of Stay: [address], [place]  05/23/2020, 10:32 AM  Advanced Heart Failure Team Pager [phone] (M-F; 7a - 4p)  Please contact [facility] for night-coverage after hours (4p -7a ) and weekends on [email or web address]  Patient seen and examined with the above-signed Advanced Practice Provider and/or Housestaff. I personally reviewed laboratory data, imaging studies and relevant notes. I independently examined the patient and formulated the important aspects of the plan. I have edited the note to reflect any of my changes or salient points. I have personally discussed the plan with the patient and/or family.  He remains on milrinone co-ox marginal but stable.  Remains in NSR. Renal function stable.  General:  Thin male No resp difficulty HEENT: normal Neck: supple. no JVD. Carotids 2+ bilat; no bruits. No lymphadenopathy or thryomegaly appreciated. Cor: PMI nondisplaced. Regular rate & rhythm.  No rubs, gallops or murmurs. Lungs: clear Abdomen: soft, nontender, nondistended. No hepatosplenomegaly. No bruits or masses. Good bowel sounds. Extremities: no cyanosis, clubbing, rash, edema Neuro: alert & orientedx3, cranial nerves grossly intact. moves all 4 extremities w/o difficulty. Affect pleasant  Remains marginal but stable. Hopefully will continue to improve with CIR support and milrinone. We did discuss the fact that milrinone may lose its effect over time and hopefully his heart will recover enough to maintain without it.  We will follow while in CIR.  Glori Bickers, MD  6:38 PM

## 2020-05-23 NOTE — Progress Notes (Signed)
PMR Admission Coordinator Pre-Admission Assessment   Patient: Cody Arellano is an 74 y.o., male MRN: [id number] DOB: 1945-10-19 Height: 6' (182.9 cm) Weight: 59.6 kg   Insurance Information HMO:    PPO:      PCP:      IPA:      80/20:      OTHER:  PRIMARY: Medicare A      Policy#: [id number]      Subscriber: pt CM Name:       Phone#:      Fax#:  Pre-Cert#: verified Civil engineer, contracting: pt Benefits:  Phone #:      Name:  Eff. Date: 10/03/10 part A only     Deduct: $1484      Out of Pocket Max: n/a      Life Max: n/a CIR: 100%      SNF: 20 full days Outpatient:      Co-Pay:  Home Health:       Co-Pay:  DME:      Co-Pay:  Providers:  [facility]      Policy#:      Phone#:    Development worker, community:       Phone#:    The Engineer, petroleum" for patients in Inpatient Rehabilitation Facilities with attached "Privacy Act [place] Records" was provided and verbally reviewed with: Patient   Emergency Contact Information         Contact Information     Name Relation Home Work [place], [place] [phone]   [phone]    Henri, [place] [phone]   [phone]         Current Medical History  Patient Admitting Diagnosis: debility    History of Present Illness: Bob Daversa is a 74 year old right-handed male with history significant for lupus diagnosed in 2014 maintained on Imuran as well as Plaquenil followed by Dr. Gavin Pound, interstitial lung disease, CAD/CABG May 9379, chronic systolic congestive heart failure, PAF on Eliquis and recent PE followed by cardiology services Dr. Acie Fredrickson. Presented 04/26/2020 with increasing shortness of breath x2 weeks with associated nausea and intermittent nonbilious nonbloody vomiting approximately 2-3 times per week.  Shortness of breath worsens with exertion and states could barely walk 10 feet without becoming short of breath and also reports 20 pound unintentional weight loss since May  2021.  Patient was recently admitted 4/21 due to acute on chronic systolic congestive heart failure.  In the ED patient was tachypneic, creatinine 1.45 from baseline 1.0-1.2, elevated BNP 1824.  Echocardiogram with ejection fraction of 20 to 02% grade 3 diastolic dysfunction with septal akinesis as well as biventricular dysfunction along with severe mitral regurgitation.  CT of the chest with contrast showed poor opacification of bilateral lower lobe pulmonary arteries.  CT angiogram of the chest showed large right-sided pleural effusion.  There is also a small left-sided pleural effusion.  Patient did receive IV Lasix as well as placed on intravenous heparin followed by cardiology services as well as initiation of milrinone.  Critical care pulmonary services Dr. Valeta Harms consulted underwent flexible video fiberoptic bronchoscopy 04/29/2020 with biopsies that was unremarkable.  Follow-up CVTS with noted concern for low cardiac output and echocardiogram worsening since recent study patient underwent Impella insertion 05/03/2020 per Dr. 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  Hospital course underwent DCCV 05/10/2020 due to ongoing V. tach converted back to A. fib.  Later went into junctional  rhythm amiodarone adjusted TEE completed showing mildly dilated LV, EF 20% mildly dilated RV with moderately decreased systolic function, moderate MR.  He did remain in normal sinus rhythm after cardiac adjustments and currently remains on amiodarone as directed.  Acute on chronic anemia latest hemoglobin 8.7.  Renal function remained stable latest creatinine 0.91.  Palliative care has been consulted to establish goals of care.  Tolerating a regular consistency diet.  Therapy evaluations completed and patient was admitted for a comprehensive rehab program.   Patient[facility] has been reviewed by the rehabilitation admission coordinator and physician.   Past  Medical History      Past Medical History:  Diagnosis Date  . Anxiety    . CHF (congestive heart failure) ([place])    . Chronic tension headache    . Colon polyps    . Coronary artery disease 2008/2009    MI with PCI x 2, then PCI x 1 in 2009  . Depression    . Dyslipidemia    . Dysrhythmia      atrial fibrillation  . Hyperlipidemia    . Hypertension    . Lupus [facility])      sees Dr Trudie Reed  . Lupus disease of the lung    . 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 Myocardial infarction ([place]) 2008  . S/P emergency CABG x 2 10/17/2019    LIMA to LAD, SVG to ramus intermediate, EVH via right thigh  . Shortness of breath        Family History   family history includes Alcohol abuse in his father; Cancer in his brother; Diabetes in his paternal uncle; Heart disease in his mother; Hyperlipidemia in his brother and sister; Hypertension in his brother and sister; Stomach cancer in his brother.   Prior Rehab/Hospitalizations Has the patient had prior rehab or hospitalizations prior to admission? No   Has the patient had major surgery during 100 days prior to admission? Yes              Current Medications   Current Facility-Administered Medications:  .  (feeding supplement) PROSource Plus liquid 30 mL, 30 mL, Oral, BID BM, Larey Dresser, MD, 30 mL at 05/23/20 0948 .  0.9 %  sodium chloride infusion (Manually program via Guardrails IV Fluids), , Intravenous, Once, Prescott Gum, Collier Salina, MD .  0.9 %  sodium chloride infusion, 250 mL, Intravenous, Continuous, Prescott Gum, Collier Salina, MD, Stopped at 05/06/20 [phone] .  [CANCELED] Place/Maintain arterial line, , , Until Discontinued **AND** 0.9 %  sodium chloride infusion, , Intra-arterial, PRN, Prescott Gum, Collier Salina, MD, Last Rate: 10 mL/hr at 05/22/20 1424, New Bag at 05/22/20 1424 .  0.9 %  sodium chloride infusion, 250 mL, Intravenous, PRN, Prescott Gum, Collier Salina, MD, Last Rate: 10 mL/hr at 05/16/20 1541, 250 mL at 05/16/20 1541 .  0.9 %  sodium chloride infusion, ,  Intravenous, Continuous, Prescott Gum, Collier Salina, MD, Stopped at 05/12/20 0445 .  acetaminophen (TYLENOL) tablet 650 mg, 650 mg, Oral, Q4H PRN, Larey Dresser, MD, 650 mg at 05/20/20 2111 .  amiodarone (PACERONE) tablet 200 mg, 200 mg, Oral, BID, Prescott Gum, Collier Salina, MD, 200 mg at 05/23/20 0948 .  apixaban (ELIQUIS) tablet 5 mg, 5 mg, Oral, BID, Prescott Gum, Collier Salina, MD, 5 mg at 05/23/20 0949 .  azaTHIOprine (IMURAN) tablet 100 mg, 100 mg, Oral, BID, Prescott Gum, Collier Salina, MD, 100 mg at 05/23/20 0948 .  bisacodyl (DULCOLAX) suppository 10 mg, 10 mg, Rectal,  Daily PRN, Ivin Poot, MD .  Chlorhexidine Gluconate Cloth 2 % PADS 6 each, 6 each, Topical, Daily, Prescott Gum, Collier Salina, MD, 6 each at 05/23/20 [phone] .  dapagliflozin propanediol (FARXIGA) tablet 10 mg, 10 mg, Oral, Daily, Lyda Jester M, PA-C, 10 mg at 05/23/20 0948 .  dextrose 10 % infusion, , Intravenous, Continuous, Ivin Poot, MD, Stopped at 05/06/20 2218 .  digoxin (LANOXIN) tablet 0.125 mg, 0.125 mg, Oral, Daily, Prescott Gum, Collier Salina, MD, 0.125 mg at 05/23/20 0948 .  docusate (COLACE) 50 MG/5ML liquid 100 mg, 100 mg, Oral, BID, Prescott Gum, Collier Salina, MD, 100 mg at 05/23/20 0948 .  docusate sodium (COLACE) capsule 100 mg, 100 mg, Oral, BID, Prescott Gum, Collier Salina, MD, 100 mg at 05/23/20 0948 .  dronabinol (MARINOL) capsule 2.5 mg, 2.5 mg, Oral, BID AC, Prescott Gum, Collier Salina, MD, 2.5 mg at 05/22/20 1740 .  feeding supplement (ENSURE ENLIVE / ENSURE PLUS) liquid 237 mL, 237 mL, Oral, TID BM, Larey Dresser, MD, 237 mL at 05/23/20 0949 .  fentaNYL (SUBLIMAZE) injection 12.5 mcg, 12.5 mcg, Intravenous, Q2H PRN, Prescott Gum, Collier Salina, MD, 12.5 mcg at 05/17/20 0456 .  hydroxychloroquine (PLAQUENIL) tablet 400 mg, 400 mg, Oral, QHS, Prescott Gum, Collier Salina, MD, 400 mg at 05/22/20 2147 .  lactated ringers infusion, , Intravenous, Continuous, Prescott Gum, Collier Salina, MD, Stopped at 05/11/20 0800 .  lidocaine (XYLOCAINE) 1 % (with pres) injection, , , PRN, Louk, Alexandra M, PA-C, 10 mL at  04/27/20 0918 .  midodrine (PROAMATINE) tablet 2.5 mg, 2.5 mg, Oral, TID WC, Larey Dresser, MD, 2.5 mg at 05/23/20 0721 .  milrinone (PRIMACOR) 20 MG/100 ML (0.2 mg/mL) infusion, 0.125 mcg/kg/min, Intravenous, Continuous, Larey Dresser, MD, Last Rate: 2.4 mL/hr at 05/23/20 0000, 0.125 mcg/kg/min at 05/23/20 0000 .  multivitamin with minerals tablet 1 tablet, 1 tablet, Oral, Daily, Prescott Gum, Collier Salina, MD, 1 tablet at 05/23/20 [phone] .  ondansetron (ZOFRAN) injection 4 mg, 4 mg, Intravenous, Q6H PRN, Prescott Gum, Collier Salina, MD, 4 mg at 05/21/20 1239 .  oxyCODONE (Oxy IR/ROXICODONE) immediate release tablet 5 mg, 5 mg, Oral, Q6H PRN, Prescott Gum, Collier Salina, MD, 5 mg at 05/18/20 1946 .  phenol (CHLORASEPTIC) mouth spray 1 spray, 1 spray, Mouth/Throat, PRN, Prescott Gum, Peter, MD .  polyethylene glycol (MIRALAX / GLYCOLAX) packet 17 g, 17 g, Oral, Daily PRN, Prescott Gum, Collier Salina, MD, 17 g at 05/12/20 0800 .  Resource ThickenUp Clear, , Oral, PRN, Prescott Gum, Collier Salina, MD .  rosuvastatin (CRESTOR) tablet 20 mg, 20 mg, Oral, Daily, Prescott Gum, Collier Salina, MD, 20 mg at 05/23/20 0948 .  sodium chloride flush (NS) 0.9 % injection 10-40 mL, 10-40 mL, Intracatheter, Q12H, Prescott Gum, Collier Salina, MD, 10 mL at 05/23/20 0949 .  sodium chloride flush (NS) 0.9 % injection 10-40 mL, 10-40 mL, Intracatheter, PRN, Prescott Gum, Peter, MD .  sodium chloride flush (NS) 0.9 % injection 3 mL, 3 mL, Intravenous, Q12H, Prescott Gum, Collier Salina, MD, 3 mL at 05/22/20 2150 .  sodium chloride flush (NS) 0.9 % injection 3 mL, 3 mL, Intravenous, PRN, Prescott Gum, Collier Salina, MD .  spironolactone (ALDACTONE) tablet 12.5 mg, 12.5 mg, Oral, Daily, Larey Dresser, MD, 12.5 mg at 05/23/20 0948 .  tamsulosin (FLOMAX) capsule 0.4 mg, 0.4 mg, Oral, Daily, Prescott Gum, Collier Salina, MD, 0.4 mg at 05/23/20 0948 .  white petrolatum (VASELINE) gel, , Topical, PRN, Ivin Poot, MD   Patients Current Diet:     Diet Order  Diet regular Room service appropriate? Yes with  Assist; Fluid consistency: Thin  Diet effective now                      Precautions / Restrictions Precautions Precautions: Fall Precaution Comments: Primo fit Restrictions Weight Bearing Restrictions: No RUE Weight Bearing: Partial weight bearing Other Position/Activity Restrictions: R impella (can utilize R arm for stabilizing RW, avoid pushing up)    Has the patient had 2 or more falls or a fall with injury in the past year? Yes   Prior Activity Level Limited Community (1-2x/wk): independent, driving, no DME   Prior Functional Level Self Care: Did the patient need help bathing, dressing, using the toilet or eating? Independent   Indoor Mobility: Did the patient need assistance with walking from room to room (with or without device)? Independent   Stairs: Did the patient need assistance with internal or external stairs (with or without device)? Independent   Functional Cognition: Did the patient need help planning regular tasks such as shopping or remembering to take medications? Independent   Home Assistive Devices / Equipment Home Assistive Devices/Equipment: Blood pressure cuff,Eyeglasses Home Equipment: None,Shower seat   Prior Device Use: Indicate devices/aids used by the patient prior to current illness, exacerbation or injury? None of the above   Current Functional Level Cognition   Overall Cognitive Status: Impaired/Different from baseline Orientation Level: Oriented X4 Safety/Judgement: Decreased awareness of safety,Decreased awareness of deficits General Comments: pt with decreased deficit and safety awareness, unable to gauge increasing weakness and take/request rest break independently    Extremity Assessment (includes Sensation/Coordination)   Upper Extremity Assessment: Overall WFL for tasks assessed,RUE deficits/detail RUE Deficits / Details: restricted elbow and shoulder. painful. increased edema RUE Sensation: WNL RUE Coordination: WNL  Lower  Extremity Assessment: Defer to PT evaluation     ADLs   Overall ADL's : Needs assistance/impaired Eating/Feeding: Set up,Sitting Eating/Feeding Details (indicate cue type and reason): Cortrak to be placed Grooming: Wash/dry hands,Wash/dry face,Set up,Sitting,Oral care Grooming Details (indicate cue type and reason): Items placed outside reach to encourage leaning forward and increase trunk strength. Upper Body Bathing: Moderate assistance,Bed level Lower Body Bathing: Maximal assistance,Bed level Upper Body Dressing : Maximal assistance,Bed level Lower Body Dressing: Maximal assistance,Sitting/lateral leans Lower Body Dressing Details (indicate cue type and reason): socks placed to mid foot, patient encouraged to bring feet closer to him while leaning forward to comfort. Toilet Transfer: Moderate assistance,+2 for safety/equipment,+2 for physical assistance,Ambulation,RW (simualted to recliner) Toilet Transfer Details (indicate cue type and reason): Mod A +2 for power up and gain balance. Functional mobility during ADLs: Minimal assistance,+2 for physical assistance,+2 for safety/equipment,Rolling walker,Moderate assistance General ADL Comments: Patient noted to walk 300' this am with staff.     Mobility   Overal bed mobility: Modified Independent Bed Mobility: Supine to Sit Rolling: Min assist Sidelying to sit: Min assist,+2 for physical assistance Supine to sit: HOB elevated,Min assist Sit to supine: Min assist,+2 for safety/equipment General bed mobility comments: HOB flat, increased time     Transfers   Overall transfer level: Needs assistance Equipment used: Rolling walker (2 wheeled) Transfers: Sit to/from Stand Sit to Stand: Min guard Stand pivot transfers: +2 safety/equipment,Min assist General transfer comment: min assist to power up     Ambulation / Gait / Stairs / Wheelchair Mobility   Ambulation/Gait Ambulation/Gait assistance: Herbalist (Feet): 400  Feet Assistive device: Rolling walker (2 wheeled) Gait Pattern/deviations: Decreased stride length,Narrow base of support,Step-through pattern,Scissoring  General Gait Details: Pt with one posterior LOB requiring minA to correct, 2 instances of running into obstacles requiring verbal cueing to correct. Gait velocity: 1.17 ft/s Gait velocity interpretation: <1.8 ft/sec, indicate of risk for recurrent falls     Posture / Balance Dynamic Sitting Balance Sitting balance - Comments: pt min guard for sitting EOB Balance Overall balance assessment: Needs assistance Sitting-balance support: Feet supported,No upper extremity supported Sitting balance-Leahy Scale: Good Sitting balance - Comments: pt min guard for sitting EOB Postural control: Posterior lean Standing balance support: Bilateral upper extremity supported,During functional activity Standing balance-Leahy Scale: Poor Standing balance comment: relies on UE support from RW     Special needs/care consideration Continuous Drip IV  milrinone .125 infusion    Previous Home Environment (from acute therapy documentation) Living Arrangements: Spouse/significant other,Children Available Help at Discharge: Family,Available 24 hours/day Type of Home: House Home Layout: Two level,Bed/bath upstairs Alternate Level Stairs-Rails: Left Alternate Level Stairs-Number of Steps: flight Home Access: Stairs to enter Technical brewer of Steps: 1 Bathroom Shower/Tub: Multimedia programmer: Standard Bathroom Accessibility: Yes How Accessible: Accessible via walker [place]: No   Discharge Living Setting Plans for Discharge Living Setting: Patient's home Type of Home at Discharge: House Discharge Home Layout: Bed/bath upstairs,Two level Alternate Level Stairs-Rails: Right Alternate Level Stairs-Number of Steps: full flight Discharge Home Access: Stairs to enter Entrance Stairs-Rails: None Entrance Stairs-Number of Steps:  1 Discharge Bathroom Shower/Tub: Walk-in shower Discharge Bathroom Toilet: Standard Discharge Bathroom Accessibility: Yes How Accessible: Accessible via walker Does the patient have any problems obtaining your medications?: No   Social/Family/Support Systems Patient Roles: Spouse Anticipated Caregiver: Nori Poland (spouse) Anticipated Caregiver's Contact Information: [name] [phone] Ability/Limitations of Caregiver: supervision Caregiver Availability: 24/7 Discharge Plan Discussed with Primary Caregiver: Yes Is Caregiver In Agreement with Plan?: Yes Does Caregiver/Family have Issues with Lodging/Transportation while Pt is in Rehab?: No   Goals Patient/Family Goal for Rehab: PT/OT mod I Expected length of stay: 5-9 days Additional Information: pathway to LVAD Pt/Family Agrees to Admission and willing to participate: Yes Program Orientation Provided & Reviewed with Pt/Caregiver Including Roles  & Responsibilities: Yes   Decrease burden of Care through IP rehab admission: n/a   Possible need for SNF placement upon discharge: n/a   Patient Condition: I have reviewed medical records from [facility], spoken with CM, and patient and spouse. I met with patient at the bedside for inpatient rehabilitation assessment.  Patient will benefit from ongoing PT and OT, can actively participate in 3 hours of therapy a day 5 days of the week, and can make measurable gains during the admission.  Patient will also benefit from the coordinated team approach during an Inpatient Acute Rehabilitation admission.  The patient will receive intensive therapy as well as Rehabilitation physician, nursing, social worker, and care management interventions.  Due to safety, medication administration, patient education and endurance the patient requires 24 hour a day rehabilitation nursing.  The patient is currently min guard with mobility and basic ADLs.  Discharge setting and therapy post discharge at home with  outpatient is anticipated.  Patient has agreed to participate in the Acute Inpatient Rehabilitation Program and will admit today.   Preadmission Screen Completed By:  Michel Santee, PT, DPT 05/23/2020 9:52 AM ______________________________________________________________________   Discussed status with Dr. Ranell Patrick on 05/23/20  at 9:52 AM  and received approval for admission today.    Admission Coordinator:  Michel Santee, PT, DPT time 9:52 AM Sudie Grumbling 05/23/20  Assessment/Plan: Diagnosis: Pulmonary debility 1. Does the need for close, 24 hr/day Medical supervision in concert with the patient's rehab needs make it unreasonable for this patient to be served in a less intensive setting? Yes 2. Co-Morbidities requiring supervision/potential complications: right sided pleural effusion, CAD, HLD, paroxysmal afib, heart failure, LVAD 3. Due to bladder management, bowel management, safety, skin/wound care, disease management, medication administration, pain management and patient education, does the patient require 24 hr/day rehab nursing? Yes 4. Does the patient require coordinated care of a physician, rehab nurse, PT, OT to address physical and functional deficits in the context of the above medical diagnosis(es)? Yes Addressing deficits in the following areas: balance, endurance, locomotion, strength, transferring, bowel/bladder control, bathing, dressing, feeding, grooming, toileting and psychosocial support 5. Can the patient actively participate in an intensive therapy program of at least 3 hrs of therapy 5 days a week? Yes 6. The potential for patient to make measurable gains while on inpatient rehab is excellent 7. Anticipated functional outcomes upon discharge from inpatient rehab: modified independent PT, modified independent OT, independent SLP 8. Estimated rehab length of stay to reach the above functional goals is: 5-7 days modI 9. Anticipated discharge destination: Home 10. Overall  Rehab/Functional Prognosis: excellent     MD Signature: Leeroy Cha, MD

## 2020-05-23 NOTE — Progress Notes (Signed)
Physical Therapy Treatment Patient Details Name: Cody Arellano MRN: [id number] DOB: 1945/11/30 Today's Date: 05/23/2020    History of Present Illness Pt is a 74 y.o. male with medical history significant for lupus, interstitial lung disease, coronary artery disease, chronic systolic CHF, paroxysmal atrial fibrillation on Eliquis, and recent PE who presented to the emergency department due to shortness of breath that has been ongoing for about 2 weeks associated with nausea and intermittent nonbilious, nonbloody vomiting ~2-3 times per week. Pt had cardiogenic shock and impella placed 11/30; heart cath on 12/1. CXR 11/30: Marked worsening of airspace disease throughout the right chest most worrisome for pneumonia. CXR 12/1: Significant interval clearing of airspace opacity on the right as well as milder clearing on the left. No new opacity evident. CXR 12/2: Stable bibasilar subsegmental atelectasis. ETT 11/30-12/2.  Impella removed on 05/16/20.    PT Comments    Patient progressing slowly towards PT goals. Tolerated gait training with Min guard-Min A for balance/safety with use of RW for support. Required 1 standing rest break unsolicited during ambulation. Noted to have 2/4 DOE and reports weakness during gait. Continues to have decreased awareness of safety/deficits and 1 almost LOB once upright without UE support needing assist to maintain balance. Pt has a bed at CIR today. Will follow.    Follow Up Recommendations  CIR;Supervision/Assistance - 24 hour     Equipment Recommendations  Rolling walker with 5" wheels;3in1 (PT)    Recommendations for Other Services       Precautions / Restrictions Precautions Precautions: Fall Restrictions Weight Bearing Restrictions: No    Mobility  Bed Mobility Overal bed mobility: Modified Independent Bed Mobility: Supine to Sit;Sit to Supine     Supine to sit: Modified independent (Device/Increase time);HOB elevated Sit to supine: Modified  independent (Device/Increase time);HOB elevated   General bed mobility comments: HOB elevated, use of rail.  Transfers Overall transfer level: Needs assistance Equipment used: Rolling walker (2 wheeled) Transfers: Sit to/from Stand Sit to Stand: Min guard;Min assist         General transfer comment: Min guard for safety and assis to steady once upright and while adjusting RW height.  Ambulation/Gait Ambulation/Gait assistance: Min assist;Min guard Gait Distance (Feet): 400 Feet Assistive device: Rolling walker (2 wheeled) Gait Pattern/deviations: Decreased stride length;Narrow base of support;Step-through pattern   Gait velocity interpretation: <1.8 ft/sec, indicate of risk for recurrent falls General Gait Details: Slow, mildly unsteady gait with 1 standing rest break, 2/4 DOE. VSS on RA. No running into obstacles today.   Stairs             Wheelchair Mobility    Modified Rankin (Stroke Patients Only)       Balance Overall balance assessment: Needs assistance Sitting-balance support: Feet supported;No upper extremity supported Sitting balance-Leahy Scale: Good     Standing balance support: During functional activity Standing balance-Leahy Scale: Poor Standing balance comment: relies on UE support from RW                            Cognition Arousal/Alertness: Awake/alert Behavior During Therapy: Flat affect Overall Cognitive Status: Impaired/Different from baseline Area of Impairment: Safety/judgement                         Safety/Judgement: Decreased awareness of safety;Decreased awareness of deficits     General Comments: pt with decreased deficit and safety awareness, unable to gauge increasing weakness and  take/request rest break independently, but this improved from prior session as pt was told prior to ambulation to take breaks as needed.      Exercises      General Comments General comments (skin integrity, edema,  etc.): HR 59-64 bpm, BP appropriately increased post walk.      Pertinent Vitals/Pain Pain Assessment: No/denies pain    Home Living                      Prior Function            PT Goals (current goals can now be found in the care plan section) Progress towards PT goals: Progressing toward goals    Frequency    Min 3X/week      PT Plan Current plan remains appropriate    Co-evaluation              AM-PAC PT "6 Clicks" Mobility   Outcome Measure  Help needed turning from your back to your side while in a flat bed without using bedrails?: None Help needed moving from lying on your back to sitting on the side of a flat bed without using bedrails?: None Help needed moving to and from a bed to a chair (including a wheelchair)?: A Little Help needed standing up from a chair using your arms (e.g., wheelchair or bedside chair)?: A Little Help needed to walk in hospital room?: A Little Help needed climbing 3-5 steps with a railing? : A Lot 6 Click Score: 19    End of Session Equipment Utilized During Treatment: Gait belt Activity Tolerance: Patient tolerated treatment well Patient left: in bed;with call bell/phone within reach;with bed alarm set Nurse Communication: Mobility status PT Visit Diagnosis: Muscle weakness (generalized) (M62.81);Difficulty in walking, not elsewhere classified (R26.2)     Time: 1771-1657 PT Time Calculation (min) (ACUTE ONLY): 18 min  Charges:  $Gait Training: 8-22 mins                     Marisa Severin, PT, DPT Acute Rehabilitation Services Pager [phone] Office [place] 05/23/2020, 11:42 AM

## 2020-05-23 NOTE — Progress Notes (Signed)
Inpatient Rehab Admissions Coordinator:    I have a bed available for pt to admit to CIR today. Dr. Haroldine Laws in agreement.  Will let pt/family and TOC team know.   Shann Medal, PT, DPT Admissions Coordinator [phone] 05/23/20  9:51 AM

## 2020-05-24 ENCOUNTER — Inpatient Hospital Stay (HOSPITAL_COMMUNITY): Payer: No Typology Code available for payment source | Admitting: Physical Therapy

## 2020-05-24 ENCOUNTER — Inpatient Hospital Stay (HOSPITAL_COMMUNITY): Payer: No Typology Code available for payment source | Admitting: Occupational Therapy

## 2020-05-24 ENCOUNTER — Encounter (HOSPITAL_COMMUNITY): Payer: Self-pay | Admitting: Physical Medicine and Rehabilitation

## 2020-05-24 DIAGNOSIS — Z515 Encounter for palliative care: Secondary | ICD-10-CM

## 2020-05-24 DIAGNOSIS — L8990 Pressure ulcer of unspecified site, unspecified stage: Secondary | ICD-10-CM | POA: Insufficient documentation

## 2020-05-24 DIAGNOSIS — Z7189 Other specified counseling: Secondary | ICD-10-CM

## 2020-05-24 DIAGNOSIS — R5381 Other malaise: Principal | ICD-10-CM

## 2020-05-24 LAB — COOXEMETRY PANEL
Carboxyhemoglobin: 2 % — ABNORMAL HIGH (ref 0.5–1.5)
Methemoglobin: 0.7 % (ref 0.0–1.5)
O2 Saturation: 59.8 %
Total hemoglobin: 9.2 g/dL — ABNORMAL LOW (ref 12.0–16.0)

## 2020-05-24 LAB — COMPREHENSIVE METABOLIC PANEL
ALT: 32 U/L (ref 0–44)
AST: 30 U/L (ref 15–41)
Albumin: 2.4 g/dL — ABNORMAL LOW (ref 3.5–5.0)
Alkaline Phosphatase: 63 U/L (ref 38–126)
Anion gap: 9 (ref 5–15)
BUN: 25 mg/dL — ABNORMAL HIGH (ref 8–23)
CO2: 32 mmol/L (ref 22–32)
Calcium: 8.7 mg/dL — ABNORMAL LOW (ref 8.9–10.3)
Chloride: 97 mmol/L — ABNORMAL LOW (ref 98–111)
Creatinine, Ser: 1.12 mg/dL (ref 0.61–1.24)
GFR, Estimated: >60 mL/min (ref >60)
Glucose, Bld: 89 mg/dL (ref 70–99)
Potassium: 4.2 mmol/L (ref 3.5–5.1)
Sodium: 138 mmol/L (ref 135–145)
Total Bilirubin: 0.7 mg/dL (ref 0.3–1.2)
Total Protein: 6 g/dL — ABNORMAL LOW (ref 6.5–8.1)

## 2020-05-24 LAB — CBC WITH DIFFERENTIAL/PLATELET
Abs Immature Granulocytes: 0.02 10*3/uL (ref 0.00–0.07)
Basophils Absolute: 0 10*3/uL (ref 0.0–0.1)
Basophils Relative: 1 %
Eosinophils Absolute: 0.2 10*3/uL (ref 0.0–0.5)
Eosinophils Relative: 3 %
HCT: 30.1 % — ABNORMAL LOW (ref 39.0–52.0)
Hemoglobin: 8.9 g/dL — ABNORMAL LOW (ref 13.0–17.0)
Immature Granulocytes: 0 %
Lymphocytes Relative: 27 %
Lymphs Abs: 1.5 10*3/uL (ref 0.7–4.0)
MCH: 27.9 pg (ref 26.0–34.0)
MCHC: 29.6 g/dL — ABNORMAL LOW (ref 30.0–36.0)
MCV: 94.4 fL (ref 80.0–100.0)
Monocytes Absolute: 0.7 10*3/uL (ref 0.1–1.0)
Monocytes Relative: 13 %
Neutro Abs: 3.3 10*3/uL (ref 1.7–7.7)
Neutrophils Relative %: 56 %
Platelets: 308 10*3/uL (ref 150–400)
RBC: 3.19 MIL/uL — ABNORMAL LOW (ref 4.22–5.81)
RDW: 20.6 % — ABNORMAL HIGH (ref 11.5–15.5)
WBC: 5.8 10*3/uL (ref 4.0–10.5)
nRBC: 0 % (ref 0.0–0.2)

## 2020-05-24 MED ORDER — CHLORHEXIDINE GLUCONATE CLOTH 2 % EX PADS
6.0000 | MEDICATED_PAD | Freq: Every day | CUTANEOUS | Status: DC
  Administered 2020-05-24: 07:00:00 6 via TOPICAL

## 2020-05-24 MED ORDER — CHLORHEXIDINE GLUCONATE CLOTH 2 % EX PADS
6.0000 | MEDICATED_PAD | Freq: Two times a day (BID) | CUTANEOUS | Status: DC
  Administered 2020-05-25 – 2020-05-31 (×12): 6 via TOPICAL

## 2020-05-24 MED ORDER — SODIUM CHLORIDE 0.9% FLUSH
10.0000 mL | Freq: Two times a day (BID) | INTRAVENOUS | Status: DC
  Administered 2020-05-24 – 2020-05-30 (×9): 10 mL

## 2020-05-24 MED ORDER — SODIUM CHLORIDE 0.9% FLUSH
10.0000 mL | INTRAVENOUS | Status: DC | PRN
  Administered 2020-05-31: 04:00:00 10 mL

## 2020-05-24 NOTE — Progress Notes (Addendum)
Advanced Heart Failure Rounding Note  PCP-Cardiologist: Mertie Moores, MD   Subjective:    Discharged to CIR 12/20  Just finished his 2nd therapy session for the day. Feels tired and fatigued. Therapist reports he did well. O2 stats maintained ~97% on RA w/ ambulation. Resting HR mid 50s.   CBC and BMP stable.   Co-ox 60% on 0.125 of milrinone.   Appetite not great this morning. Only ate a small amount of breakfast. Drinking ensure.    Objective:   Weight Range: 61 kg Body mass index is 18.24 kg/m.   Vital Signs:   Temp:  [97.5 F (36.4 C)-98.9 F (37.2 C)] 98 F (36.7 C) (12/21 1037) Pulse Rate:  [53-63] 56 (12/21 1037) Resp:  [17-20] 17 (12/21 1037) BP: (96-111)/(53-69) 104/62 (12/21 1037) SpO2:  [98 %-100 %] 100 % (12/21 1037) Weight:  [61 kg] 61 kg (12/21 0500) Last BM Date: 05/23/20  Weight change: Filed Weights   05/24/20 0500  Weight: 61 kg    Intake/Output:   Intake/Output Summary (Last 24 hours) at 05/24/2020 1227 Last data filed at 05/24/2020 0825 Gross per 24 hour  Intake 273.39 ml  Output 800 ml  Net -526.61 ml      Physical Exam    General:  Thin fatigue appearing elderly AAM. No resp difficulty HEENT: Normal Neck: Supple. JVP 8 cm . Carotids 2+ bilat; no bruits. No lymphadenopathy or thyromegaly appreciated. Cor: PMI nondisplaced. Regular rate & rhythm. No rubs, gallops or murmurs. Lungs: Clear Abdomen: Soft, nontender, nondistended. No hepatosplenomegaly. No bruits or masses. Good bowel sounds. Extremities: No cyanosis, clubbing, rash, edema + RUE PICC  Neuro: Alert & orientedx3, cranial nerves grossly intact. moves all 4 extremities w/o difficulty. Affect pleasant   Telemetry   N/A   EKG    No new EKG to review today   Labs    CBC Recent Labs    05/23/20 0330 05/24/20 0615  WBC 5.7 5.8  NEUTROABS  --  3.3  HGB 8.9* 8.9*  HCT 31.0* 30.1*  MCV 93.7 94.4  PLT 310 644   Basic Metabolic Panel Recent Labs     05/23/20 0330 05/24/20 0615  NA 140 138  K 4.2 4.2  CL 99 97*  CO2 31 32  GLUCOSE 97 89  BUN 27* 25*  CREATININE 1.04 1.12  CALCIUM 8.3* 8.7*   Liver Function Tests Recent Labs    05/24/20 0615  AST 30  ALT 32  ALKPHOS 63  BILITOT 0.7  PROT 6.0*  ALBUMIN 2.4*   No results for input(s): LIPASE, AMYLASE in the last 72 hours. Cardiac Enzymes No results for input(s): CKTOTAL, CKMB, CKMBINDEX, TROPONINI in the last 72 hours.  BNP: BNP (last 3 results) Recent Labs    04/06/20 2134 04/26/20 1319 04/30/20 0248  BNP 1,677.6* 1,824.4* 1,685.5*    ProBNP (last 3 results) No results for input(s): PROBNP in the last 8760 hours.   D-Dimer No results for input(s): DDIMER in the last 72 hours. Hemoglobin A1C No results for input(s): HGBA1C in the last 72 hours. Fasting Lipid Panel No results for input(s): CHOL, HDL, LDLCALC, TRIG, CHOLHDL, LDLDIRECT in the last 72 hours. Thyroid Function Tests No results for input(s): TSH, T4TOTAL, T3FREE, THYROIDAB in the last 72 hours.  Invalid input(s): FREET3  Other results:   Imaging     No results found.   Medications:     Scheduled Medications: . (feeding supplement) PROSource Plus  30 mL Oral BID BM  .  amiodarone  200 mg Oral Daily  . apixaban  5 mg Oral BID  . aspirin EC  81 mg Oral Daily  . azaTHIOprine  100 mg Oral BID  . Chlorhexidine Gluconate Cloth  6 each Topical Daily  . dapagliflozin propanediol  10 mg Oral Daily  . digoxin  0.125 mg Oral Daily  . docusate sodium  100 mg Oral BID  . dronabinol  2.5 mg Oral BID AC  . feeding supplement  237 mL Oral TID BM  . hydroxychloroquine  400 mg Oral QHS  . midodrine  2.5 mg Oral TID WC  . multivitamin with minerals  1 tablet Oral Daily  . polyvinyl alcohol  2 drop Both Eyes Daily  . rosuvastatin  20 mg Oral Daily  . sodium chloride flush  10-40 mL Intracatheter Q12H  . spironolactone  12.5 mg Oral Daily  . tamsulosin  0.4 mg Oral Daily  . torsemide  20 mg  Oral Daily     Infusions: . milrinone 0.125 mcg/kg/min (05/23/20 1731)     PRN Medications:  acetaminophen, bisacodyl, polyethylene glycol, sodium chloride flush, white petrolatum    Assessment/Plan   1. 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  Echo this admission looks worse than 10/21 with EF 20-25% with septal akinesis, mid to apical inferior akinesis, apical anterior and apex akinesis, moderately dilated and dysfunctional RV, moderate TR, severe MR, bicuspid aortic valve with no stenosis, mild aortic insufficiency. Progressive/end-stage CHF worsened by onset of atrial fibrillation. Cardiogenic shock 11/30 with co-ox down to 30%, Impella 5.5 placed to allow time to stabilize and consider further steps.  Impella extracted 12/13. 0.125 milrinone added post explant for marginal co-ox at 51%. Remains on milrinone 0.125. Co-ox 60% today.  He has end-stage biventricular HF. He is not a transplant candidate.  Barriers to LVAD include nutritional status/deconditioning and RV.  I think RV would be acceptable. We discussed in [place], not LVAD candidate at this point with malnutrition/deconditioning. He is now in [place] for aggressive PT. Nutritional status improving. Will continue on milrinone as bridge to VAD to allow time to improve nutritional status and mobility. - Continue milrinone 0.125 mcg/kg/min  - Volume status stable. Continue torsemide 20 daily.  - Off losartan with low BP.  - Cont midodrine 2.5 tid - Continue Spiro 12.5 daily  - Continue digoxin 0.125  - Continue Farxiga 10 mg  2. CAD: Prior history of MI with LAD and ramus PCI. AdmittedMay 2021with anterior STEMI. LHC with occluded ostial LAD (in-stent), 90% ostial ramus, 60-70% proximal LCx, nondominant RCA. PTCA to LAD and ramus to restore flow, then CABG with LIMA-LAD and SVG-ramus.  No chest pain, doubt ACS.  Fall in EF from 10/21 to 11/21, but he did not present with ACS.  [place] 12/1 showed patient  LIMA-LAD, patent native LAD, patent dominant LCx.  The SVG-ramus is occluded with severe diffuse in-stent restenosis in proximal ramus.  Ramus not intervened upon as this would be a complex intervention and unlikely to markedly improve EF.  - No s/s angina - Continue ASA 81 and statin.    3. AKI: Creatinine now trending down with improved cardiac output. Renal function normalized.  Cr 1.12 today 4. Right pleural effusion: S/p thoracentesis, transudative (CHF).  5. Atrial fibrillation/flutter with RVR: DCCV on 12/3, went back to NSR 12/7. RRR on exam today  - Continue amiodarone 200 mg once daily  - Continue apixaban 5 mg bid  6. Mitral regurgitation:  TEE 12/3 with moderate  central MR.  Doubt MitraClip will help him much with moderate MR and he is likely too advanced.  7. H/o PE: In 10/21.  Keep anticoagulated, on apixaban. No bleeding  8. Bicuspid aortic valve: Mild AI, no AS.  9. SLE: H/o pericarditis.  On Imuran and hydroxychloroquine.  - Restarted home SLE regimen now that he is taking po.   10. Pulmonary nodules/emphysema: CCM following, s/p bronch/BAL (no growth).  11. ID: Concern for right-sided PNA, ?aspiration.  Cultures NGTD.  Now afebrile and has completed course of vancomycin/cefepime.   - no change 12. Thrombocytopenia: Resolved.  May have been due to critical illness, low grade hemolysis with Impella. LDH ok.  HIT negative.  - Back on apixaban.  13. Hypokalemia/hypomag: Supplement as needed. K 4.2 today 14. Acute blood loss anemia: No obvious site.   - Hgb now stable and up-trending, 8.9 today - Transfuse hgb < 8.  15. VT: Episode early am 12/7, required DCCV.  No recurrence.  16. Deconditioning: - continue Therapy in CIR. Greatly appreciate their assistance     Length of Stay: 1  Nelida Gores  05/24/2020, 12:27 PM  Advanced Heart Failure Team Pager [phone] (M-F; 7a - 4p)  Please contact [facility] for night-coverage after hours (4p -7a ) and weekends  on [email or web address]  Patient seen and examined with the above-signed Advanced Practice Provider and/or Housestaff. I personally reviewed laboratory data, imaging studies and relevant notes. I independently examined the patient and formulated the important aspects of the plan. I have edited the note to reflect any of my changes or salient points. I have personally discussed the plan with the patient and/or family.  Feels fatigued after rehab. Co-ox stable at 60% on milrinone 0.125. Maintaining NSR.   General:  Weak appearing. No resp difficulty HEENT: normal Neck: supple. no JVD. Carotids 2+ bilat; no bruits. No lymphadenopathy or thryomegaly appreciated. Cor: PMI nondisplaced. Regular rate & rhythm. No rubs, gallops or murmurs. Lungs: clear Abdomen: soft, nontender, nondistended. No hepatosplenomegaly. No bruits or masses. Good bowel sounds. Extremities: no cyanosis, clubbing, rash, edema Neuro: alert & orientedx3, cranial nerves grossly intact. moves all 4 extremities w/o difficulty. Affect pleasant  Continue milrinone and current HF meds. Appreciate CIR's care. We will continue to follow.

## 2020-05-24 NOTE — Progress Notes (Signed)
[facility] Individual Statement of Services  Patient Name:  Cody Arellano  Date:  05/24/2020  Welcome to the [facility].  Our goal is to provide you with an individualized program based on your diagnosis and situation, designed to meet your specific needs.  With this comprehensive rehabilitation program, you will be expected to participate in at least 3 hours of rehabilitation therapies Monday-Friday, with modified therapy programming on the weekends.  Your rehabilitation program will include the following services:  Physical Therapy (PT), Occupational Therapy (OT), 24 hour per day rehabilitation nursing, Neuropsychology, Care Coordinator, Rehabilitation Medicine, Nutrition Services and Pharmacy Services  Weekly team conference Tuesday to discuss your progress.  Your Inpatient Rehabilitation Care Coordinator will talk with you frequently to get your input and to update you on team discussions.  Team conferences with you and your family in attendance may also be held.  Expected length of stay: 7-10 days  Overall anticipated outcome: Mod/i-supervision level  Depending on your progress and recovery, your program may change. Your Inpatient Rehabilitation Care Coordinator will coordinate services and will keep you informed of any changes. Your Inpatient Rehabilitation Care Coordinator's name and contact numbers are listed  below.  The following services may also be recommended but are not provided by the [facility]:    [facility] will be made to provide these services after discharge if needed.  Arrangements include referral to agencies that provide these services.  Your insurance has been verified to be:  VA Medicare Part A and fed BCBS Your primary doctor is:  Viviana Simpler  Pertinent information will be shared with your doctor and your insurance  company.  Inpatient Rehabilitation Care Coordinator:  Ovidio Kin, [place] or Emilia Beck  Information discussed with and copy given to patient by: Elease Hashimoto, 05/24/2020, 1:37 PM

## 2020-05-24 NOTE — Progress Notes (Signed)
Initial Nutrition Assessment  DOCUMENTATION CODES:   Underweight,Severe malnutrition in context of chronic illness  INTERVENTION:   - Ensure Enlive po TID, each supplement provides 350 kcal and 20 grams of protein  - Magic Cup TID with meals, each supplement provides 290 kcal and 9 grams of protein  - ProSource Plus 30 ml po BID, each supplement provides 100 kcal and 15 grams of protein  - Continue MVI with minerals daily  NUTRITION DIAGNOSIS:   Severe Malnutrition related to chronic illness (CHF) as evidenced by severe fat depletion,severe muscle depletion,percent weight loss (22.9% weight loss in less than 7 months).  GOAL:   Patient will meet greater than or equal to 90% of their needs  MONITOR:   PO intake,Supplement acceptance,Labs,Weight trends,Skin,I & O's  REASON FOR ASSESSMENT:   Malnutrition Screening Tool    ASSESSMENT:   74 year old male with PMH significant for lupus, interstitial lung disease, CAD s/p CABG in May 2021, CHF, PAF and recent. Presented 04/26/20 with increasing SOB x 2 weeks with associated nausea and vomiting. CT angiogram of the chest showed large right-sided pleural effusion and small left-sided pleural effusion. Pt underwent Impella insertion 05/03/20 with removal on 05/16/20.   Spoke with pt at bedside. Pt working on lunch meal tray at time of RD visit. RD assisted pt in cutting up chicken to make PO intake easier for pt. RD also provided pt with an orange Magic Cup from unit freezer. Pt very appreciative.  RD is familiar with pt from acute admission during which pt required Cortrak for nutrition support. Cortrak removed prior to CIR admission. Pt reports that his appetite remains poor. He states that he did not eat much breakfast but that he did drink an Ensure supplement. He is going to try to eat more at lunch.  Pt shares that he has been consuming 3 full Ensure supplements daily. Prior to CIR, pt eating Magic Cups TID with meals. He prefers  the orange flavor. RD to reorder these.  Reviewed weight history in chart. Pt with an 18.1 kg weight loss since 10/27/19. This is a 22.9% weight loss in less than 7 months which is severe and significant for timeframe. Pt meets criteria for severe malnutrition.  Meal Completion: 20% x 1 documented meal  Medications reviewed and include: ProSource Plus BID, farxiga, colace, marinol BID, Ensure Enlive TID, MVI with minerals, spironolactone, torsemide, milrinone gtt  Labs reviewed: hemoglobin 8.9 CBG's: 107-123 x 24 hours  NUTRITION - FOCUSED PHYSICAL EXAM:  Flowsheet Row Most Recent Value  Orbital Region Severe depletion  Upper Arm Region Severe depletion  Thoracic and Lumbar Region Severe depletion  Buccal Region Severe depletion  Temple Region Severe depletion  Clavicle Bone Region Severe depletion  Clavicle and Acromion Bone Region Severe depletion  Scapular Bone Region Severe depletion  Dorsal Hand Severe depletion  Patellar Region Severe depletion  Anterior Thigh Region Severe depletion  Posterior Calf Region Severe depletion  Edema (RD Assessment) None  Hair Reviewed  Eyes Reviewed  Mouth Reviewed  Skin Reviewed  Nails Reviewed       Diet Order:   Diet Order            Diet regular Room service appropriate? Yes with Assist; Fluid consistency: Thin  Diet effective now                 EDUCATION NEEDS:   Education needs have been addressed  Skin:  Skin Assessment: Skin Integrity Issues: Stage II: coccyx Incisions: right chest Other:  skin tear to scrotum  Last BM:  05/23/20  Height:   Ht Readings from Last 1 Encounters:  05/24/20 6' (1.829 m)    Weight:   Wt Readings from Last 1 Encounters:  05/24/20 61 kg    Ideal Body Weight:  80.9 kg  BMI:  Body mass index is 18.24 kg/m.  Estimated Nutritional Needs:   Kcal:  2200-2400  Protein:  120-140 grams  Fluid:  >/= 2.0 L    Gustavus Bryant, MS, RD, LDN Inpatient Clinical Dietitian Please see  [facility] for contact information.

## 2020-05-24 NOTE — Progress Notes (Signed)
Patient ID: Cody Arellano, male   DOB: 22-Aug-1945, 75 y.o.   MRN: [id number] [place] with wife via telephone who wants to speak with Elmo Putt or Michelle-palliative care regarding questions she has. Have secure messaged Elmo Putt since Sharyn Lull is not available to reach out to wife regarding her questions. Informed wife of target discharge date of 12/30. Pt was hoping to discharge by 12/24. Will need to see how he does while on rehab.

## 2020-05-24 NOTE — Evaluation (Signed)
Occupational Therapy Assessment and Plan  Patient Details  Name: Cody Arellano MRN: [id number] Date of Birth: [birth year]  OT Diagnosis: abnormal posture and muscle weakness (generalized) Rehab Potential: Rehab Potential (ACUTE ONLY): Good ELOS: 7-10 days   Today's Date: 05/24/2020 OT Individual Time: 3007-6226 OT Individual Time Calculation (min): 70 min     Hospital Problem: Principal Problem:   Debility Active Problems:   Goals of care, counseling/discussion   Chronic systolic heart failure ([place])   Pressure injury of skin   Palliative care by specialist   Past Medical History:  Past Medical History:  Diagnosis Date  . Anxiety   . CHF (congestive heart failure) ([place])   . Chronic tension headache   . Colon polyps   . Coronary artery disease 2008/2009   MI with PCI x 2, then PCI x 1 in 2009  . Depression   . Dyslipidemia   . Dysrhythmia    atrial fibrillation  . Hyperlipidemia   . Hypertension   . Lupus [facility])    sees Dr Trudie Reed  . Lupus disease of the lung   . 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 Myocardial infarction ([place]) 2008  . 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 Shortness of breath    Past Surgical History:  Past Surgical History:  Procedure Laterality Date  . BRONCHIAL WASHINGS  04/29/2020   Procedure: BRONCHIAL WASHINGS;  Surgeon: Garner Nash, DO;  Location: [facility];  Service: Pulmonary;;  . CARDIAC CATHETERIZATION    . CLIPPING OF ATRIAL APPENDAGE N/A 10/17/2019   Procedure: Clipping Of Atrial Appendage using AtriCure [id number] 45 MM AtriClip.;  Surgeon: Rexene Alberts, MD;  Location: [place];  Service: Open Heart Surgery;  Laterality: N/A;  . CORONARY ARTERY BYPASS GRAFT N/A 10/17/2019   Procedure: CORONARY ARTERY BYPASS GRAFTING (CABG) using LIMA to LAD; Endoscopic harvest right greater saphenous vein: SVG to RAMUS.;  Surgeon: Rexene Alberts, MD;  Location: [place];  Service: Open Heart Surgery;   Laterality: N/A;  . CORONARY BALLOON ANGIOPLASTY N/A 10/17/2019   Procedure: CORONARY BALLOON ANGIOPLASTY;  Surgeon: Nelva Bush, MD;  Location: [facility] [facility];  Service: Cardiovascular;  Laterality: N/A;  . coronary stents  2009  . CORONARY/GRAFT ACUTE MI REVASCULARIZATION N/A 10/17/2019   Procedure: Coronary/Graft Acute MI Revascularization;  Surgeon: Nelva Bush, MD;  Location: [place] [facility];  Service: Cardiovascular;  Laterality: N/A;  . CORONARY/GRAFT ANGIOGRAPHY N/A 05/04/2020   Procedure: CORONARY/GRAFT ANGIOGRAPHY;  Surgeon: Larey Dresser, MD;  Location: [place] [facility];  Service: Cardiovascular;  Laterality: N/A;  . ENDOVEIN HARVEST OF GREATER SAPHENOUS VEIN Right 10/17/2019   Procedure: [place] Of Greater Saphenous Vein;  Surgeon: Rexene Alberts, MD;  Location: [place];  Service: Open Heart Surgery;  Laterality: Right;  . IABP INSERTION N/A 10/17/2019   Procedure: IABP Insertion;  Surgeon: Nelva Bush, MD;  Location: [place] [facility];  Service: Cardiovascular;  Laterality: N/A;  . INCISION AND DRAINAGE ABSCESS N/A 01/29/2020   Procedure: INCISION AND DRAINAGE BILATERAL PERIRECTAL ABSCESS;  Surgeon: Stark Klein, MD;  Location: [place];  Service: General;  Laterality: N/A;  . IR THORACENTESIS ASP PLEURAL SPACE W/IMG GUIDE  10/26/2019  . IR THORACENTESIS ASP PLEURAL SPACE W/IMG GUIDE  04/27/2020  . LEFT HEART CATH AND CORS/GRAFTS ANGIOGRAPHY N/A 05/04/2020   Procedure: LEFT HEART CATH AND CORS/GRAFTS ANGIOGRAPHY;  Surgeon: Larey Dresser, MD;  Location:  [facility];  Service: Cardiovascular;  Laterality: N/A;  . PLACEMENT OF IMPELLA LEFT VENTRICULAR ASSIST DEVICE Right 05/03/2020   Procedure: PLACEMENT OF IMPELLA 5.5 LEFT VENTRICULAR ASSIST DEVICE VIA RIGHT AXILLARY;  Surgeon: [name], MD;  Location: [place];  Service: Open Heart Surgery;  Laterality: Right;  . REMOVAL OF IMPELLA LEFT VENTRICULAR ASSIST DEVICE N/A 05/16/2020   Procedure:  REMOVAL OF IMPELLA LEFT VENTRICULAR ASSIST DEVICE;  Surgeon: Ivin Poot, MD;  Location: [place];  Service: Open Heart Surgery;  Laterality: N/A;  . RIGHT/LEFT HEART CATH AND CORONARY ANGIOGRAPHY N/A 10/17/2019   Procedure: RIGHT/LEFT HEART CATH AND CORONARY ANGIOGRAPHY;  Surgeon: Nelva Bush, MD;  Location: [place] [facility];  Service: Cardiovascular;  Laterality: N/A;  . TEE WITHOUT CARDIOVERSION  10/17/2019   Procedure: Transesophageal Echocardiogram (Tee);  Surgeon: Rexene Alberts, MD;  Location: [facility];  Service: Open Heart Surgery;;  . TEE WITHOUT CARDIOVERSION N/A 05/03/2020   Procedure: TRANSESOPHAGEAL ECHOCARDIOGRAM (TEE);  Surgeon: [name], MD;  Location: [place];  Service: Open Heart Surgery;  Laterality: N/A;  . TEE WITHOUT CARDIOVERSION N/A 05/06/2020   Procedure: TRANSESOPHAGEAL ECHOCARDIOGRAM (TEE);  Surgeon: Larey Dresser, MD;  Location: [facility];  Service: Cardiovascular;  Laterality: N/A;  bedside  . VIDEO BRONCHOSCOPY N/A 04/29/2020   Procedure: VIDEO BRONCHOSCOPY WITHOUT FLUORO;  Surgeon: Garner Nash, DO;  Location: [place];  Service: Pulmonary;  Laterality: N/A;    Assessment & Plan Clinical Impression:  Cody Arellano is a 74 year old right-handed male with history significant for lupus diagnosed in 2014 maintained on Imuran as well as Plaquenil followed by Dr. Gavin Pound, interstitial lung disease, CAD/CABG May 6803, chronic systolic congestive heart failure, PAF on Eliquis and recent PE followed by cardiology services Dr. Acie Fredrickson.  Per chart review patient lives with spouse.  Independent prior to admission.  Two-level home bed and bath upstairs.  Family with good support.  Presented 04/26/2020 with increasing shortness of breath x2 weeks with associated nausea and intermittent nonbilious nonbloody vomiting approximately 2-3 times per week.  Shortness of breath worsens with exertion and states could barely walk 10 feet without becoming short of  breath and also reports 20 pound unintentional weight loss since May 2021.  Patient was recently admitted 4/21 due to acute on chronic systolic congestive heart failure.  In the ED patient was tachypneic, creatinine 1.45 from baseline 1.0-1.2, elevated BNP 1824.  Echocardiogram with ejection fraction of 20 to 21% grade 3 diastolic dysfunction with septal akinesis as well as biventricular dysfunction along with severe mitral regurgitation.  CT of the chest with contrast showed poor opacification of bilateral lower lobe pulmonary arteries.  CT angiogram of the chest showed large right-sided pleural effusion.  There is also a small left-sided pleural effusion.  Patient did receive IV Lasix as well as placed on intravenous heparin followed by cardiology services as well as initiation of milrinone.  Critical care pulmonary services Dr. Valeta Harms consulted underwent flexible video fiberoptic bronchoscopy 04/29/2020 with biopsies that was unremarkable.  Follow-up CVTS with noted concern for low cardiac output and echocardiogram worsening since recent study patient underwent Impella insertion 05/03/2020 per Dr. 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  Hospital course underwent DCCV 05/10/2020 due to ongoing V. tach converted back to A. fib.  Later went into junctional rhythm amiodarone adjusted TEE completed showing mildly dilated LV, EF 20% mildly dilated RV with moderately decreased systolic function, moderate MR.  He did remain  in normal sinus rhythm after cardiac adjustments and currently remains on amiodarone as directed.  Acute on chronic anemia latest hemoglobin 8.9.  Renal function remained stable latest creatinine 1.04.  Palliative care has been consulted to establish goals of care.  Tolerating a regular consistency diet.  Patient transferred to CIR on 05/23/2020 .    Patient currently requires CGA to supervision with basic self-care skills secondary to muscle weakness, decreased  cardiorespiratoy endurance and decreased sitting balance, decreased standing balance and decreased balance strategies.  Prior to hospitalization, patient could complete ADLs with independent .  Patient will benefit from skilled intervention to increase independence with basic self-care skills prior to discharge home with care partner.  Anticipate patient will require 24 hour supervision and follow up home health.  OT - End of Session Activity Tolerance: Tolerates 30+ min activity with multiple rests Endurance Deficit: Yes Endurance Deficit Description: Min intermittent seated RBs needed during sinkside self care due to fatigue OT Assessment Rehab Potential (ACUTE ONLY): Good OT Barriers to Discharge: Home environment access/layout OT Barriers to Discharge Comments: bedroom and full bathroom on second floor; pt reports first floor has half bath and would be able to create first floor setup if needed OT Patient demonstrates impairments in the following area(s): Balance;Safety;Cognition;Skin Integrity;Endurance OT Basic ADL's Functional Problem(s): Grooming;Bathing;Dressing;Toileting OT Transfers Functional Problem(s): Toilet;Tub/Shower OT Plan OT Intensity: Minimum of 1-2 x/day, 45 to 90 minutes OT Frequency: 5 out of 7 days OT Duration/Estimated Length of Stay: 7-10 days OT Treatment/Interventions: Balance/vestibular training;Discharge planning;Self Care/advanced ADL retraining;Therapeutic Activities;Disease mangement/prevention;Functional mobility training;Skin care/wound managment;Patient/family education;Therapeutic Exercise;DME/adaptive equipment instruction;Psychosocial support;UE/LE Strength taining/ROM;Wheelchair propulsion/positioning OT Basic Self-Care Anticipated Outcome(s): supervision- mod I OT Toileting Anticipated Outcome(s): mod I OT Bathroom Transfers Anticipated Outcome(s): supervision OT Recommendation Patient destination: Home Follow Up Recommendations: 24 hour  supervision/assistance;Home health OT Equipment Recommended: To be determined   OT Evaluation Precautions/Restrictions  Precautions Precautions: Fall Precaution Comments: skin integrity- pressure sore on buttocks, restricted RUE Restrictions Weight Bearing Restrictions: No Other Position/Activity Restrictions: R impella General Chart Reviewed: Yes Pain Pain Assessment Pain Scale: 0-10 Pain Score: 0-No pain Home Living/Prior Functioning Home Living Family/patient expects to be discharged to:: Assisted living Living Arrangements: Spouse/significant other Available Help at Discharge: Family,Available 24 hours/day Type of Home: House Home Access: Stairs to enter [employer] of Steps: 1 Entrance Stairs-Rails: None Home Layout: Two level,Bed/bath upstairs Alternate Level Stairs-Number of Steps: flight Alternate Level Stairs-Rails: Left Bathroom Shower/Tub: Engineer, mining: Yes  Lives With: Spouse IADL History Homemaking Responsibilities: No Occupation: Retired Prior Function Level of Independence: Independent with homemaking with ambulation,Independent with basic ADLs,Independent with transfers,Independent with gait  Able to Take Stairs?: Yes Driving: Yes Vision Baseline Vision/History: No visual deficits Patient Visual Report: No change from baseline Vision Assessment?: No apparent visual deficits Perception  Perception: Within Functional Limits Praxis Praxis: Intact Cognition Overall Cognitive Status: Within Functional Limits for tasks assessed Arousal/Alertness: Awake/alert Orientation Level: Person;Place;Situation Person: Oriented Place: Oriented Situation: Oriented Year: 2021 Month: December Day of Week: Correct Memory: Appears intact Immediate Memory Recall: Sock;Blue;Bed Memory Recall Sock: Without Cue Memory Recall Blue: Without Cue Memory Recall Bed: Without Cue Attention:  Focused;Sustained Focused Attention: Appears intact Sustained Attention: Appears intact Awareness: Appears intact Problem Solving: Appears intact Safety/Judgment: Appears intact Sensation Sensation Light Touch: Impaired by gross assessment (numbness and tingling bilateral hands and feet per pt) Hot/Cold: Appears Intact Proprioception: Appears Intact Stereognosis: Not tested Coordination Gross Motor Movements are Fluid and Coordinated: Yes Fine Motor Movements  are Fluid and Coordinated: Yes Finger Nose Finger Test: [facility] BUE Motor  Motor Motor: Within Functional Limits  Trunk/Postural Assessment  Cervical Assessment Cervical Assessment: Within Functional Limits Thoracic Assessment Thoracic Assessment: Exceptions to [facility] (mild kyphosis) Lumbar Assessment Lumbar Assessment: Exceptions to [facility] (posterior pelvic tilt) Postural Control Postural Control: Within Functional Limits  Balance Balance Balance Assessed: Yes Static Sitting Balance Static Sitting - Balance Support: Feet supported Static Sitting - Level of Assistance: 5: Stand by assistance Dynamic Sitting Balance Dynamic Sitting - Balance Support: Feet supported;During functional activity Dynamic Sitting - Level of Assistance: 5: Stand by assistance Static Standing Balance Static Standing - Balance Support: Bilateral upper extremity supported Static Standing - Level of Assistance: 5: Stand by assistance Dynamic Standing Balance Dynamic Standing - Balance Support: Left upper extremity supported Dynamic Standing - Level of Assistance: 5: Stand by assistance Extremity/Trunk Assessment RUE Assessment RUE Assessment: Exceptions to [facility] (restricted extremity; light use only) Active Range of Motion (AROM) Comments: WFL General Strength Comments: grip strength 4-/5 LUE Assessment LUE Assessment: Exceptions to [facility] Passive Range of Motion (PROM) Comments: WNL Active Range of Motion (AROM) Comments: WNL General Strength Comments:  4-/5 LUE  Care Tool Care Tool Self Care Eating   Eating Assist Level: Set up assist    Oral Care    Oral Care Assist Level: Set up assist    Bathing   Body parts bathed by patient: Right arm;Left arm;Chest;Abdomen;Front perineal area;Right upper leg;Left upper leg;Right lower leg;Left lower leg;Face     Assist Level: Supervision/Verbal cueing    Upper Body Dressing(including orthotics)   What is the patient wearing?: Hospital gown only   Assist Level: Supervision/Verbal cueing    Lower Body Dressing (excluding footwear)     Assist for lower body dressing: Supervision/Verbal cueing    Putting on/Taking off footwear     Assist for footwear: Supervision/Verbal cueing       Care Tool Toileting Toileting activity   Assist for toileting: Contact Guard/Touching assist     Care Tool Bed Mobility Roll left and right activity   Roll left and right assist level: Supervision/Verbal cueing    Sit to lying activity        Lying to sitting edge of bed activity         Care Tool Transfers Sit to stand transfer        Chair/bed transfer         Toilet transfer   Assist Level: Contact Guard/Touching assist     Care Tool Cognition Expression of Ideas and Wants Expression of Ideas and Wants: Without difficulty (complex and basic) - expresses complex messages without difficulty and with speech that is clear and easy to understand   Understanding Verbal and Non-Verbal Content Understanding Verbal and Non-Verbal Content: Understands (complex and basic) - clear comprehension without cues or repetitions   Memory/Recall Ability *first 3 days only Memory/Recall Ability *first 3 days only: That he or she is in a hospital/hospital unit;Current season;Location of own room;Staff names and faces    Refer to Care Plan for [place] 1 OT Short Term Goal 1 (Week 1): STGs=LTGs due to ELOS  Recommendations for other services: None    Skilled Therapeutic  Intervention ADL ADL Grooming: Setup Where Assessed-Grooming: Sitting at sink Upper Body Bathing: Supervision/safety Where Assessed-Upper Body Bathing: Sitting at sink Lower Body Bathing: Supervision/safety Where Assessed-Lower Body Bathing: Sitting at sink;Standing at sink Upper Body Dressing: Supervision/safety Where Assessed-Upper Body Dressing: Sitting  at sink Lower Body Dressing: Supervision/safety Where Assessed-Lower Body Dressing: Sitting at sink;Standing at sink Toileting: Contact guard Where Assessed-Toileting: Editor, commissioning Method: Ambulating Mobility  Bed Mobility Bed Mobility: Sit to Supine Sit to Supine: Supervision/Verbal cueing Transfers Sit to Stand: Supervision/Verbal cueing Stand to Sit: Supervision/Verbal cueing;Contact Guard/Touching assist (CGA for low surfaces ; supervision for all other surfaces.)   Skilled Intervention:  Pt sitting up in recliner, no c/o pain throughout session, agreeable to working with OT.  Initial evaluation completed, educated pt regarding OT scope of practice, and collaborated with pt regarding OT POC.  Pt completed self care and functional mobility per above levels of assist required.  Unable to donn shirt overhead this session due to per nursing pts IV continuous and unable to disconnect at this time, therefore dressing completed using hospital gown.  During self care, pts condom catheter needing readjustment; nurse made aware and per nurse, cleared to remove at this time. OT also noted light bloody drainage seeping through dressing over right chest, nurse made aware. Pt requesting back to bed at end of session and completed bed mobility with supervision.  Call bell in reach, bed alarm on.   Discharge Criteria: Patient will be discharged from OT if patient refuses treatment 3 consecutive times without medical reason, if treatment goals not met, if there is a change in medical status, if patient makes  no progress towards goals or if patient is discharged from hospital.  The above assessment, treatment plan, treatment alternatives and goals were discussed and mutually agreed upon: by patient  Ezekiel Slocumb 05/24/2020, 4:18 PM

## 2020-05-24 NOTE — Progress Notes (Signed)
Inpatient Rehabilitation Care Coordinator Assessment and Plan Patient Details  Name: Cody Arellano MRN: [id number] Date of Birth: March 13, 1946  Today's Date: 05/24/2020  Hospital Problems: Principal Problem:   Debility  Past Medical History:  Past Medical History:  Diagnosis Date  . Anxiety   . CHF (congestive heart failure) ([place])   . Chronic tension headache   . Colon polyps   . Coronary artery disease 2008/2009   MI with PCI x 2, then PCI x 1 in 2009  . Depression   . Dyslipidemia   . Dysrhythmia    atrial fibrillation  . Hyperlipidemia   . Hypertension   . Lupus [facility])    sees Dr Trudie Reed  . Lupus disease of the lung   . 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 Myocardial infarction ([place]) 2008  . 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 Shortness of breath    Past Surgical History:  Past Surgical History:  Procedure Laterality Date  . BRONCHIAL WASHINGS  04/29/2020   Procedure: BRONCHIAL WASHINGS;  Surgeon: Garner Nash, DO;  Location: [place] ENDOSCOPY;  Service: Pulmonary;;  . CARDIAC CATHETERIZATION    . CLIPPING OF ATRIAL APPENDAGE N/A 10/17/2019   Procedure: Clipping Of Atrial Appendage using AtriCure [id number] 45 MM AtriClip.;  Surgeon: Rexene Alberts, MD;  Location: [place];  Service: Open Heart Surgery;  Laterality: N/A;  . CORONARY ARTERY BYPASS GRAFT N/A 10/17/2019   Procedure: CORONARY ARTERY BYPASS GRAFTING (CABG) using LIMA to LAD; Endoscopic harvest right greater saphenous vein: SVG to RAMUS.;  Surgeon: Rexene Alberts, MD;  Location: [place];  Service: Open Heart Surgery;  Laterality: N/A;  . CORONARY BALLOON ANGIOPLASTY N/A 10/17/2019   Procedure: CORONARY BALLOON ANGIOPLASTY;  Surgeon: Nelva Bush, MD;  Location: [place] CV LAB;  Service: Cardiovascular;  Laterality: N/A;  . coronary stents  2009  . CORONARY/GRAFT ACUTE MI REVASCULARIZATION N/A 10/17/2019   Procedure: Coronary/Graft Acute MI Revascularization;   Surgeon: Nelva Bush, MD;  Location: [place] CV LAB;  Service: Cardiovascular;  Laterality: N/A;  . CORONARY/GRAFT ANGIOGRAPHY N/A 05/04/2020   Procedure: CORONARY/GRAFT ANGIOGRAPHY;  Surgeon: Larey Dresser, MD;  Location: [place] CV LAB;  Service: Cardiovascular;  Laterality: N/A;  . ENDOVEIN HARVEST OF GREATER SAPHENOUS VEIN Right 10/17/2019   Procedure: [place] Of Greater Saphenous Vein;  Surgeon: Rexene Alberts, MD;  Location: [place];  Service: Open Heart Surgery;  Laterality: Right;  . IABP INSERTION N/A 10/17/2019   Procedure: IABP Insertion;  Surgeon: Nelva Bush, MD;  Location: [place] CV LAB;  Service: Cardiovascular;  Laterality: N/A;  . INCISION AND DRAINAGE ABSCESS N/A 01/29/2020   Procedure: INCISION AND DRAINAGE BILATERAL PERIRECTAL ABSCESS;  Surgeon: Stark Klein, MD;  Location: [place];  Service: General;  Laterality: N/A;  . IR THORACENTESIS ASP PLEURAL SPACE W/IMG GUIDE  10/26/2019  . IR THORACENTESIS ASP PLEURAL SPACE W/IMG GUIDE  04/27/2020  . LEFT HEART CATH AND CORS/GRAFTS ANGIOGRAPHY N/A 05/04/2020   Procedure: LEFT HEART CATH AND CORS/GRAFTS ANGIOGRAPHY;  Surgeon: Larey Dresser, MD;  Location: [place] CV LAB;  Service: Cardiovascular;  Laterality: N/A;  . PLACEMENT OF IMPELLA LEFT VENTRICULAR ASSIST DEVICE Right 05/03/2020   Procedure: PLACEMENT OF IMPELLA 5.5 LEFT VENTRICULAR ASSIST DEVICE VIA RIGHT AXILLARY;  Surgeon: [name], MD;  Location: [place];  Service: Open Heart Surgery;  Laterality: Right;  . REMOVAL OF IMPELLA LEFT VENTRICULAR ASSIST  DEVICE N/A 05/16/2020   Procedure: REMOVAL OF IMPELLA LEFT VENTRICULAR ASSIST DEVICE;  Surgeon: Ivin Poot, MD;  Location: [place];  Service: Open Heart Surgery;  Laterality: N/A;  . RIGHT/LEFT HEART CATH AND CORONARY ANGIOGRAPHY N/A 10/17/2019   Procedure: RIGHT/LEFT HEART CATH AND CORONARY ANGIOGRAPHY;  Surgeon: Nelva Bush, MD;  Location: [place] CV LAB;  Service:  Cardiovascular;  Laterality: N/A;  . TEE WITHOUT CARDIOVERSION  10/17/2019   Procedure: Transesophageal Echocardiogram (Tee);  Surgeon: Rexene Alberts, MD;  Location: [facility] OR;  Service: Open Heart Surgery;;  . TEE WITHOUT CARDIOVERSION N/A 05/03/2020   Procedure: TRANSESOPHAGEAL ECHOCARDIOGRAM (TEE);  Surgeon: [name], MD;  Location: [place];  Service: Open Heart Surgery;  Laterality: N/A;  . TEE WITHOUT CARDIOVERSION N/A 05/06/2020   Procedure: TRANSESOPHAGEAL ECHOCARDIOGRAM (TEE);  Surgeon: Larey Dresser, MD;  Location: [facility];  Service: Cardiovascular;  Laterality: N/A;  bedside  . VIDEO BRONCHOSCOPY N/A 04/29/2020   Procedure: VIDEO BRONCHOSCOPY WITHOUT FLUORO;  Surgeon: Garner Nash, DO;  Location: [place];  Service: Pulmonary;  Laterality: N/A;   Social History:  reports that he quit smoking about 27 years ago. His smoking use included cigarettes. He has a 20.00 pack-year smoking history. He has never used smokeless tobacco. He reports that he does not drink alcohol and does not use drugs.  Family / Support Systems Marital Status: Married Patient Roles: Spouse,Parent Spouse/Significant Other: Terry-wife [phone] Children: Siri Cole  Traci-daughter Other Supports: Friends and church members Anticipated Caregiver: [name] Ability/Limitations of Caregiver: supervision level Caregiver Availability: 24/7 Family Dynamics: Close knit with all five children and grandchildren. Three of their children are local and involved while the other two are involved from a far.  Social History Preferred language: English Religion: Baptist Cultural Background: No issues Education: HS Read: Yes Write: Yes Employment Status: Retired Public relations account executive Issues: No issues Guardian/Conservator: None-according to MD pt is capable of making his own decisions while here. Will include wife due to pt at times is mildly confused. Living Will in place but  a full code   Abuse/Neglect Abuse/Neglect Assessment Can Be Completed: Yes Physical Abuse: Denies Verbal Abuse: Denies Sexual Abuse: Denies Exploitation of patient/patient's resources: Denies Self-Neglect: Denies  Emotional Status Pt's affect, behavior and adjustment status: Pt and wife are hoping he will do well on rehab and be home soon. Pt wants to go home by Christmas but team wants him to stay a little bit longer than that. Pt aware needs to eat more and get stronger before can have a LVAD. Recent Psychosocial Issues: other health issues Psychiatric History: History of depression/anxiety takes medications for this and finds it helps. If he is here long enough would benefit from seeing neuro-psych while here Substance Abuse History: No issues  Patient / Family Perceptions, Expectations & Goals Pt/Family understanding of illness & functional limitations: Pt and wife can explain his heart issues, pt doesn;t realize he is going home with IV milrinone, wife is. Both talk with the MD daily and feel they understand the process. Premorbid pt/family roles/activities: hsband, father, grandfather, retiree, veteran, etc Anticipated changes in roles/activities/participation: resume Pt/family expectations/goals: Pt states: " I want to go home by Christmas."  Wife states: " I hope he can do well and get stronger and eat better."  [employer]: None Premorbid Home Care/DME Agencies: Other (Comment) (has tub seat at home) Transportation available at discharge: Wife Resource referrals recommended: Neuropsychology  Discharge Planning Living Arrangements: Spouse/significant other Support Systems:  Spouse/significant other,Children,Other relatives,Friends/neighbors Type of Residence: Private residence [employer] Resources: [employer] (specify) ([place]) Museum/gallery curator Resources: Ruth Referred: No Living Expenses: Own Money  Management: Patient,Spouse Does the patient have any problems obtaining your medications?: No Home Management: wife Patient/Family Preliminary Plans: Return home with wife who is able to assist him. Pam-IV infusion aware of his need for IV milrinone and has been working on setting this up with [employer] authorization. Team evaluating pt and setting goals, feel 7-10 days needed to reach supervision level. Pt has some safety issues when moving. Care Coordinator Anticipated Follow Up Needs: HH/OP  Clinical Impression Pleasant chronically ill gentleman who is exhausted from am therapies. Wife is involved and has many questions. She is wanting to talk with Palliative care-have messaged them to reach out to her. Await therapy evaluations and work on discharge needs.  Elease Hashimoto 05/24/2020, 1:35 PM

## 2020-05-24 NOTE — Patient Care Conference (Signed)
Inpatient RehabilitationTeam Conference and Plan of Care Update Date: 05/24/2020   Time: 11:35 AM    Patient Name: Cody Arellano      Medical Record Number: [id number]  Date of Birth: [birth year] Sex: Male         Room/Bed: 4M10C/4M10C-01 Payor Info: Payor: MEDICARE / Plan: MEDICARE PART A / Product Type: *No Product type* /    Admit Date/Time:  05/23/2020  4:14 PM  Primary Diagnosis:  Debility  Hospital Problems: Principal Problem:   Debility Active Problems:   Goals of care, counseling/discussion   Chronic systolic heart failure ([place])   Pressure injury of skin   Palliative care by specialist    Expected Discharge Date: Expected Discharge Date: 06/02/20  Team Members Present: Physician leading conference: Dr. Courtney Heys Care Coodinator Present: Dorthula Nettles, RN, BSN, CRRN;Becky Dupree, LCSW Nurse Present: Mohammed Kindle, RN PT Present: Canary Brim, PT OT Present: Willeen Cass, OT PPS Coordinator present : Gunnar Fusi, SLP     Current Status/Progress Goal Weekly Team Focus  Bowel/Bladder   Pt is continent x2.  Pt will remain continent x2.  Assess q shift and PRN.   Swallow/Nutrition/ Hydration             ADL's             Mobility             Communication             Safety/Cognition/ Behavioral Observations            Pain   Pt does not report pain at this time.  Pain < 3 will be maintained.  Assess q shift and prn   Skin   Pt has stage II pressure ulcer on coccyx and right chest sutures.  Pt will remain infection free and wounds will be dressed per orders.  Assess q shift and prn.     Discharge Planning:  Await evaluations today-home with wife and other family members to assist at discharge.   Team Discussion: BP is good, does well with Ensures, continent B/B, stage 2 sacral with foam dressing. OT eval pending. PT reports patient is min assist ambulation up to 400'. Going home with wife, will go home on a Milrinone drip.  Patient on target to  meet rehab goals: yes, some evals still pending  *See Care Plan and progress notes for long and short-term goals.   Revisions to Treatment Plan:  MD ordered calorie count  Teaching Needs: Family education Wound care, dressing changes Milrinone education  Current Barriers to Discharge: Decreased caregiver support, Home enviroment access/layout, Lack of/limited family support, Weight, Weight bearing restrictions, Behavior and IV medications  Possible Resolutions to Barriers: Continue current medications, educate wound care, education weight bearing precautions, medication management, provide emotional support to patient and family.     Medical Summary Current Status: on mirinone gtt- will go home on it; BP 90s-110s SBP; drinks ensures- poor appetite; conitnent B/B; sacral stage II 2.3x1.8 cm- looks like blister  Barriers to Discharge: Behavior;Decreased family/caregiver support;Home enviroment access/layout;Wound care;Weight;Other (comments);Medical stability  Barriers to Discharge Comments: milrinone gtt to go home with; Possible Resolutions to [employer] Focus: PT eval (OT doesn't see til PM)-  min A 479ft- staris CGA-min A; 7-10 days- set d/c as 12/30-   Continued Need for Acute Rehabilitation Level of Care: The patient requires daily medical management by a physician with specialized training in physical medicine and rehabilitation for the following reasons: Direction of a multidisciplinary  physical rehabilitation program to maximize functional independence : Yes Medical management of patient stability for increased activity during participation in an intensive rehabilitation regime.: Yes Analysis of laboratory values and/or radiology reports with any subsequent need for medication adjustment and/or medical intervention. : Yes   I attest that I was present, lead the team conference, and concur with the assessment and plan of the team.   Cristi Loron 05/24/2020, 6:10 PM

## 2020-05-24 NOTE — Progress Notes (Signed)
[place] PHYSICAL MEDICINE & REHABILITATION PROGRESS NOTE   Subjective/Complaints:  Pt reports poor appetite- and early satiety-takes Marinol 2.5 mg BID- before meals, but still ate <10% of breakfas-t ate 2-3 bits of eggs/potatoes, applesauce and ate his bacon and drank ensure- per pt, but plate appears FULL.   Admitted weighed 165 lbs 6 months ago and is now 133 lbs at 20ft tall.  Has a living will - is full code.  Wife asking to speak with Lars Mage- from palliative care- will call and set up.    ROS:  Pt denies SOB, abd pain, CP, N/V/C/D, and vision changes  Objective:   No results found. Recent Labs    05/23/20 0330 05/24/20 0615  WBC 5.7 5.8  HGB 8.9* 8.9*  HCT 31.0* 30.1*  PLT 310 308   Recent Labs    05/23/20 0330 05/24/20 0615  NA 140 138  K 4.2 4.2  CL 99 97*  CO2 31 32  GLUCOSE 97 89  BUN 27* 25*  CREATININE 1.04 1.12  CALCIUM 8.3* 8.7*    Intake/Output Summary (Last 24 hours) at 05/24/2020 1008 Last data filed at 05/24/2020 0825 Gross per 24 hour  Intake 273.39 ml  Output 800 ml  Net -526.61 ml        Physical Exam: Vital Signs Blood pressure 111/61, pulse 63, temperature 98.9 F (37.2 C), temperature source Oral, resp. rate 18, height 6' (1.829 m), weight 61 kg, SpO2 98 %.   Physical Exam General: pt sitting up in bed- face gaunt, wife at bedside, NAD HEENT: conjugate gaze Heart: RRR-   (+) murmur noted Chest: CTA B/L- no W/R/R- good air movement- not coarse Abdomen: Soft, NT, ND, (+)BS -hypoactive Extremities: No clubbing, cyanosis, or edema. Pulses are 2+ Skin: Clean and intact without signs of breakdown Neuro: Pt is cognitively appropriate with normal insight, memory, and awareness. Cranial nerves 2-12 are intact. Patient is alert sitting up in bed.  Oriented x3 and follows commands. No tremors. Motor function is grossly 4/5.  Musculoskeletal: Full ROM, No pain with AROM or PROM in the neck, trunk, or extremities. Posture  appropriate Psych: flat affect- saying he ate a certain amount- wife shaking her head no.    Assessment/Plan: 1. Functional deficits which require 3+ hours per day of interdisciplinary therapy in a comprehensive inpatient rehab setting.  Physiatrist is providing close team supervision and 24 hour management of active medical problems listed below.  Physiatrist and rehab team continue to assess barriers to discharge/monitor patient progress toward functional and medical goals  Care Tool:  Bathing              Bathing assist       Upper Body Dressing/Undressing Upper body dressing   What is the patient wearing?: Hospital gown only    Upper body assist      Lower Body Dressing/Undressing Lower body dressing            Lower body assist       Toileting Toileting    Toileting assist       Transfers Chair/bed transfer  Transfers assist           Locomotion Ambulation   Ambulation assist              Walk 10 feet activity   Assist           Walk 50 feet activity   Assist           Walk 150 feet  activity   Assist           Walk 10 feet on uneven surface  activity   Assist           Wheelchair     Assist               Wheelchair 50 feet with 2 turns activity    Assist            Wheelchair 150 feet activity     Assist          Blood pressure 111/61, pulse 63, temperature 98.9 F (37.2 C), temperature source Oral, resp. rate 18, height 6' (1.829 m), weight 61 kg, SpO2 98 %.  Medical Problem List and Plan: 1.  Debility secondary to acute on chronic systolic congestive heart failure/cardiogenic shock/ischemic cardiomyopathy status post Impella insertion 05/03/2020 with removal of Impella assistive device 05/16/2020  12/21- has PICC RUE for Milrinone at home             -patient may shower             -ELOS/Goals: modI 5-7 days 2.  Antithrombotics: -DVT/anticoagulation: Eliquis              -antiplatelet therapy: N/A 3. Pain Management: Oxycodone as needed- has not used in 5 days, may d/c. Main source of pain is sacral pressure injury- offload at least q2H to prevent pain and worsening of injury.  4. Mood: Provide emotional support             -antipsychotic agents: N/A 5. Neuropsych: This patient is capable of making decisions on his own behalf. 6. Skin/Wound Care: Routine skin checks 12/21- keep off sacral decub- needs to eat MORE protein/calories to heal sacral decub-  7. Fluids/Electrolytes/Nutrition: Routine in and outs with follow-up chemistries. Electrolytes stable on 12/20: monitor weekly. 8.  Atrial fibrillation/ischemic cardiomyopathy/chronic systolic congestive heart failure/history of PE.  Status post DCCV 05/10/2020.  Continue Lanoxin 0.125 mg daily, amiodarone 200 mg  daily, ProAmatine 2.5 mg 3 times daily, milrinone as directed, Aldactone 12.5 mg daily .  Followed closely by cardiology services.  12/21- on Milrinone gtt- to go home on this- has PICC;  9.  SLE.  Imuran 100 mg twice daily, Plaquenil 400 mg nightly.  Follow-up Dr. Gavin Pound rheumatology services. 10.  Hyperlipidemia.  Crestor 11.  BPH.  Flomax 0.4 mg daily.  Check PVR 12. Tachypneic to 23 on 12/20: monitor RR TID.   13. Poor appetite/Severe malnutrition  12/21- has lost >30 lbs in last 6 months- on marinol 2.5 mg BID- might increase after speaking with palliative care- has living will- is full code. Will order calorie count on pt.    LOS: 1 days A FACE TO FACE EVALUATION WAS PERFORMED  Karisha Marlin 05/24/2020, 10:08 AM

## 2020-05-24 NOTE — Progress Notes (Signed)
Palliative:  HPI:74 y.o.malewith past medical history of SLE, interstitial lung disease, coronary artery disease, chronic systolic heart failure, CABG 10/2019, paroxysmal atrial fibrillation on Eliquis, recent PEadmitted on 11/23/2021with shortness of breath related to cardiogenic shock with CHF EF 20-25%, severe MR/TR, afib RVR.Impella placed 11/30. Cortrak placed 12/3.Impella and Cortrak removed and now in CIR for intensive rehab.   I received message from Dr. Dagoberto Ligas and Ovidio Kin, CSW that Cody Arellano wife is requesting follow up from palliative care. I went to Cody Arellano bedside and he is in much better spirits. He is smiling and more engaged in conversation. He continues to struggle with poor intake and is trying to force himself to eat but reports that he ate almost all his lunch and drinking Ensure as well. He continues to be motivated for improvement and now to return to his home (anticipated d/c is 12/30 but he talks about getting home for Christmas). There is no family at bedside and I tell him that I received a call from his wife but she is not present and he gives me permission to call her back and speak with her. He has no further questions or concerns at this time.   I called and spoke with wife, Cody Arellano. She is en-route back to CIR for visit soon. She tells me that their son, Cody Arellano, and herself would like to have conference call tomorrow as they have some more questions. We decided to have conference call tomorrow 1000 am and hopefully I can answer their questions further. She also asks about MOST form and I do not see a copy in electronic records and will follow up to ensure that we have copy of completed MOST.   All questions/concerns addressed. Emotional support provided.   Exam: Alert, oriented. Smiling and in good spirits. Still fatigued but seems slightly improved compared to when I last saw him in ICU. Generalized weakness. No distress. Breathing regular, unlabored. Milrinone  going. Abd flat.   Plan: - Family conference call 12/22 1000 am per family request for palliative care follow up.  - Continue with therapy with hopes of improvement and consideration of LVAD in future.   Omar, NP Palliative Medicine Team Pager [phone] (Please see [email or web address] for schedule) Team Phone [phone]    Greater than 50%  of this time was spent counseling and coordinating care related to the above assessment and plan

## 2020-05-24 NOTE — Evaluation (Signed)
Physical Therapy Assessment and Plan  Patient Details  Name: Cody Arellano MRN: [id number] Date of Birth: June 19, 1945  PT Diagnosis: Abnormal posture, Abnormality of gait, Difficulty walking, Impaired sensation, Muscle weakness and Pain in L hamstring and at pressure ulcer site Rehab Potential: Good ELOS: 7-10 days   Today's Date: 05/24/2020 PT Individual Time: 6222-9798 PT Individual Time Calculation (min): 60 min    Hospital Problem: Principal Problem:   Debility   Past Medical History:  Past Medical History:  Diagnosis Date  . Anxiety   . CHF (congestive heart failure) ([place])   . Chronic tension headache   . Colon polyps   . Coronary artery disease 2008/2009   MI with PCI x 2, then PCI x 1 in 2009  . Depression   . Dyslipidemia   . Dysrhythmia    atrial fibrillation  . Hyperlipidemia   . Hypertension   . Lupus [facility])    sees Dr Trudie Reed  . Lupus disease of the lung   . 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 Myocardial infarction ([place]) 2008  . 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 Shortness of breath    Past Surgical History:  Past Surgical History:  Procedure Laterality Date  . BRONCHIAL WASHINGS  04/29/2020   Procedure: BRONCHIAL WASHINGS;  Surgeon: Garner Nash, DO;  Location: [place] [facility];  Service: Pulmonary;;  . CARDIAC CATHETERIZATION    . CLIPPING OF ATRIAL APPENDAGE N/A 10/17/2019   Procedure: Clipping Of Atrial Appendage using AtriCure [id number] 45 MM AtriClip.;  Surgeon: Rexene Alberts, MD;  Location: [place];  Service: Open Heart Surgery;  Laterality: N/A;  . CORONARY ARTERY BYPASS GRAFT N/A 10/17/2019   Procedure: CORONARY ARTERY BYPASS GRAFTING (CABG) using LIMA to LAD; Endoscopic harvest right greater saphenous vein: SVG to RAMUS.;  Surgeon: Rexene Alberts, MD;  Location: [place];  Service: Open Heart Surgery;  Laterality: N/A;  . CORONARY BALLOON ANGIOPLASTY N/A 10/17/2019   Procedure: CORONARY BALLOON  ANGIOPLASTY;  Surgeon: Nelva Bush, MD;  Location: [place] CV LAB;  Service: Cardiovascular;  Laterality: N/A;  . coronary stents  2009  . CORONARY/GRAFT ACUTE MI REVASCULARIZATION N/A 10/17/2019   Procedure: Coronary/Graft Acute MI Revascularization;  Surgeon: Nelva Bush, MD;  Location: [place] CV LAB;  Service: Cardiovascular;  Laterality: N/A;  . CORONARY/GRAFT ANGIOGRAPHY N/A 05/04/2020   Procedure: CORONARY/GRAFT ANGIOGRAPHY;  Surgeon: Larey Dresser, MD;  Location: [place] CV LAB;  Service: Cardiovascular;  Laterality: N/A;  . ENDOVEIN HARVEST OF GREATER SAPHENOUS VEIN Right 10/17/2019   Procedure: [place] Of Greater Saphenous Vein;  Surgeon: Rexene Alberts, MD;  Location: [place];  Service: Open Heart Surgery;  Laterality: Right;  . IABP INSERTION N/A 10/17/2019   Procedure: IABP Insertion;  Surgeon: Nelva Bush, MD;  Location: [place] CV LAB;  Service: Cardiovascular;  Laterality: N/A;  . INCISION AND DRAINAGE ABSCESS N/A 01/29/2020   Procedure: INCISION AND DRAINAGE BILATERAL PERIRECTAL ABSCESS;  Surgeon: Stark Klein, MD;  Location: [place];  Service: General;  Laterality: N/A;  . IR THORACENTESIS ASP PLEURAL SPACE W/IMG GUIDE  10/26/2019  . IR THORACENTESIS ASP PLEURAL SPACE W/IMG GUIDE  04/27/2020  . LEFT HEART CATH AND CORS/GRAFTS ANGIOGRAPHY N/A 05/04/2020   Procedure: LEFT HEART CATH AND CORS/GRAFTS ANGIOGRAPHY;  Surgeon: Larey Dresser, MD;  Location: [place] CV LAB;  Service: Cardiovascular;  Laterality: N/A;  . PLACEMENT OF IMPELLA LEFT VENTRICULAR  ASSIST DEVICE Right 05/03/2020   Procedure: PLACEMENT OF IMPELLA 5.5 LEFT VENTRICULAR ASSIST DEVICE VIA RIGHT AXILLARY;  Surgeon: [name], MD;  Location: [place];  Service: Open Heart Surgery;  Laterality: Right;  . REMOVAL OF IMPELLA LEFT VENTRICULAR ASSIST DEVICE N/A 05/16/2020   Procedure: REMOVAL OF IMPELLA LEFT VENTRICULAR ASSIST DEVICE;  Surgeon: Ivin Poot, MD;  Location: [place];  Service: Open Heart Surgery;  Laterality: N/A;  . RIGHT/LEFT HEART CATH AND CORONARY ANGIOGRAPHY N/A 10/17/2019   Procedure: RIGHT/LEFT HEART CATH AND CORONARY ANGIOGRAPHY;  Surgeon: Nelva Bush, MD;  Location: [place] CV LAB;  Service: Cardiovascular;  Laterality: N/A;  . TEE WITHOUT CARDIOVERSION  10/17/2019   Procedure: Transesophageal Echocardiogram (Tee);  Surgeon: Rexene Alberts, MD;  Location: [facility];  Service: Open Heart Surgery;;  . TEE WITHOUT CARDIOVERSION N/A 05/03/2020   Procedure: TRANSESOPHAGEAL ECHOCARDIOGRAM (TEE);  Surgeon: [name], MD;  Location: [place];  Service: Open Heart Surgery;  Laterality: N/A;  . TEE WITHOUT CARDIOVERSION N/A 05/06/2020   Procedure: TRANSESOPHAGEAL ECHOCARDIOGRAM (TEE);  Surgeon: Larey Dresser, MD;  Location: [facility];  Service: Cardiovascular;  Laterality: N/A;  bedside  . VIDEO BRONCHOSCOPY N/A 04/29/2020   Procedure: VIDEO BRONCHOSCOPY WITHOUT FLUORO;  Surgeon: Garner Nash, DO;  Location: [place];  Service: Pulmonary;  Laterality: N/A;    Assessment & Plan Clinical Impression: Patient is a 74 y.o. year old male with history significant for lupus diagnosed in 2014 maintained on Imuran as well as Plaquenil followed by Dr. Gavin Pound, interstitial lung disease, CAD/CABG May 8144, chronic systolic congestive heart failure, PAF on Eliquis and recent PE followed by cardiology services Dr. Acie Fredrickson.  Per chart review patient lives with spouse.  Independent prior to admission.  Two-level home bed and bath upstairs.  Family with good support.  Presented 04/26/2020 with increasing shortness of breath x2 weeks with associated nausea and intermittent nonbilious nonbloody vomiting approximately 2-3 times per week.  Shortness of breath worsens with exertion and states could barely walk 10 feet without becoming short of breath and also reports 20 pound unintentional weight loss since May 2021.  Patient was recently admitted 4/21 due  to acute on chronic systolic congestive heart failure.  In the ED patient was tachypneic, creatinine 1.45 from baseline 1.0-1.2, elevated BNP 1824.  Echocardiogram with ejection fraction of 20 to 81% grade 3 diastolic dysfunction with septal akinesis as well as biventricular dysfunction along with severe mitral regurgitation.  CT of the chest with contrast showed poor opacification of bilateral lower lobe pulmonary arteries.  CT angiogram of the chest showed large right-sided pleural effusion.  There is also a small left-sided pleural effusion.  Patient did receive IV Lasix as well as placed on intravenous heparin followed by cardiology services as well as initiation of milrinone.  Critical care pulmonary services Dr. Valeta Harms consulted underwent flexible video fiberoptic bronchoscopy 04/29/2020 with biopsies that was unremarkable.  Follow-up CVTS with noted concern for low cardiac output and echocardiogram worsening since recent study patient underwent Impella insertion 05/03/2020 per Dr. 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  Hospital course underwent DCCV 05/10/2020 due to ongoing V. tach converted back to A. fib.  Later went into junctional rhythm amiodarone adjusted TEE completed showing mildly dilated LV, EF 20% mildly dilated RV with moderately decreased systolic function, moderate MR.  He did remain in normal sinus rhythm after cardiac adjustments and currently remains on amiodarone as directed.  Acute on chronic  anemia latest hemoglobin 8.9.  Renal function remained stable latest creatinine 1.04.  Palliative care has been consulted to establish goals of care.  Tolerating a regular consistency diet.  Due to patient generalized decline debility related to congestive heart failure multimedical he was admitted for a comprehensive rehab program.  .  Patient transferred to [place] on 05/23/2020 .   Patient currently requires min with mobility secondary to muscle weakness,  decreased cardiorespiratoy endurance and decreased sitting balance, decreased standing balance and decreased balance strategies.  Prior to hospitalization, patient was independent  with mobility and lived with Spouse in a House home.  Home access is 1Stairs to enter.  Patient will benefit from skilled PT intervention to maximize safe functional mobility, minimize fall risk and decrease caregiver burden for planned discharge home with 24 hour supervision.  Anticipate patient will benefit from follow up [place] at discharge.  PT - End of Session Activity Tolerance: Tolerates 30+ min activity with multiple rests Endurance Deficit: Yes PT Assessment Rehab Potential (ACUTE/IP ONLY): Good PT Barriers to Discharge: Inaccessible home environment PT Barriers to Discharge Comments: no handrial to enter home, bed/bath on 2nd level PT Patient demonstrates impairments in the following area(s): Balance;Pain;Safety;Endurance;Sensory;Skin Integrity PT Transfers Functional Problem(s): Bed Mobility;Bed to Chair;Car PT Locomotion Functional Problem(s): Ambulation;Stairs PT Plan PT Intensity: Minimum of 1-2 x/day ,45 to 90 minutes PT Frequency: 5 out of 7 days PT Duration Estimated Length of Stay: 7-10 days PT Treatment/Interventions: Ambulation/gait training;Balance/vestibular training;Community reintegration;Cognitive remediation/compensation;DME/adaptive equipment instruction;Discharge planning;Disease management/prevention;Neuromuscular re-education;Patient/family education;Skin care/wound management;Stair training;Therapeutic Exercise;UE/LE Coordination activities;Visual/perceptual remediation/compensation;UE/LE Strength taining/ROM;Therapeutic Activities;Splinting/orthotics;Psychosocial support;Pain management;Functional mobility training PT Transfers Anticipated Outcome(s): supervision PT Locomotion Anticipated Outcome(s): supervision PT Recommendation Recommendations for Other Services: Therapeutic Recreation  consult Therapeutic Recreation Interventions: Stress management Follow Up Recommendations: Home health PT Patient destination: Home Equipment Recommended: Rolling walker with 5" wheels   PT Evaluation Precautions/Restrictions Precautions Precautions: Fall Restrictions Weight Bearing Restrictions: No General   Vital SignsTherapy Vitals Temp: 98 F (36.7 C) Pulse Rate: (!) 56 Resp: 17 BP: 104/62 Patient Position (if appropriate): Lying Oxygen Therapy SpO2: 100 % O2 Device: Room Air Pain Pain Assessment Pain Scale: 0-10 Pain Score: 5  Pain Type: Chronic pain Pain Location: Other (Comment) (pt reports pain at bed sore site and L hamstring) Pain Orientation: Left Pain Descriptors / Indicators: Sore Pain Onset: With Activity Home Living/Prior [place] expects to be discharged to:: Assisted living Living Arrangements: Spouse/significant other Available Help at Discharge: Family;Available 24 hours/day Type of Home: House Home Access: Stairs to enter [employer] of Steps: 1 Entrance Stairs-Rails: None Home Layout: Two level;Bed/bath upstairs Alternate Level Stairs-Number of Steps: flight Alternate Level Stairs-Rails: Left Bathroom Shower/Tub: Multimedia programmer: Standard Bathroom Accessibility: Yes  Lives With: Spouse Prior Function Level of Independence: Independent with homemaking with ambulation;Independent with basic ADLs;Independent with transfers;Independent with gait  Able to Take Stairs?: Yes Driving: Yes Vision/Perception  Perception Perception: Within Functional Limits Praxis Praxis: Intact  Cognition Overall Cognitive Status: Impaired/Different from baseline Orientation Level: Oriented X4 Attention: Focused;Sustained Focused Attention: Appears intact Sustained Attention: Appears intact Awareness: Appears intact Problem Solving: Appears intact Safety/Judgment: Appears  intact Sensation Sensation Light Touch: Impaired by gross assessment (pt reports numbness/tingling in B toes and B fingers "pretty much constant") Coordination Gross Motor Movements are Fluid and Coordinated: No Fine Motor Movements are Fluid and Coordinated: No Heel Shin Test: slow and required multiple demonstrations and VC Motor  Motor Motor: Within Functional Limits   Trunk/Postural Assessment  Cervical Assessment  Cervical Assessment: Within Functional Limits Thoracic Assessment Thoracic Assessment: Exceptions to [facility] (mild kyphosis) Lumbar Assessment Lumbar Assessment: Exceptions to [facility] (posterior pelvic tilt)  Balance Balance Balance Assessed: Yes Standardized Balance Assessment Standardized Balance Assessment: Timed Up and Go Test Timed Up and Go Test TUG: Normal TUG Normal TUG (seconds): 67 Static Sitting Balance Static Sitting - Balance Support: Feet supported Static Sitting - Level of Assistance: 5: Stand by assistance Dynamic Sitting Balance Dynamic Sitting - Balance Support: Feet supported;During functional activity Dynamic Sitting - Level of Assistance: 5: Stand by assistance Static Standing Balance Static Standing - Balance Support: Bilateral upper extremity supported Static Standing - Level of Assistance: 4: Min assist Dynamic Standing Balance Dynamic Standing - Balance Support: Bilateral upper extremity supported Dynamic Standing - Level of Assistance: 4: Min assist Extremity Assessment      RLE Assessment RLE Assessment: Exceptions to [facility] General Strength Comments: grossly 4/5 LLE Assessment General Strength Comments: grossly 4/5  Care Tool Care Tool Bed Mobility Roll left and right activity   Roll left and right assist level: Supervision/Verbal cueing    Sit to lying activity   Sit to lying assist level: Supervision/Verbal cueing    Lying to sitting edge of bed activity   Lying to sitting edge of bed assist level: Supervision/Verbal cueing      Care Tool Transfers Sit to stand transfer   Sit to stand assist level: Minimal Assistance - Patient > 75%    Chair/bed transfer   Chair/bed transfer assist level: Minimal Assistance - Patient > 75%     Toilet transfer   Assist Level: Minimal Assistance - Patient > 75%    Car transfer   Car transfer assist level: Minimal Assistance - Patient > 75%      Care Tool Locomotion Ambulation   Assist level: Minimal Assistance - Patient > 75% Assistive device: Walker-rolling Max distance: 400  Walk 10 feet activity   Assist level: Minimal Assistance - Patient > 75% Assistive device: Walker-rolling   Walk 50 feet with 2 turns activity   Assist level: Minimal Assistance - Patient > 75% Assistive device: Walker-rolling  Walk 150 feet activity   Assist level: Minimal Assistance - Patient > 75% Assistive device: Walker-rolling  Walk 10 feet on uneven surfaces activity Walk 10 feet on uneven surfaces activity did not occur: Safety/medical concerns      Stairs   Assist level: Minimal Assistance - Patient > 75% Stairs assistive device: 2 hand rails Max number of stairs: 4  Walk up/down 1 step activity   Walk up/down 1 step (curb) assist level: Minimal Assistance - Patient > 75% Walk up/down 1 step or curb assistive device: Walker    Walk up/down 4 steps activity Walk up/down 4 steps assist level: Minimal Assistance - Patient > 75% Walk up/down 4 steps assistive device: 2 hand rails  Walk up/down 12 steps activity Walk up/down 12 steps activity did not occur: Safety/medical concerns      Pick up small objects from floor   Pick up small object from the floor assist level: Moderate Assistance - Patient 50 - 74%    Wheelchair Will patient use wheelchair at discharge?: No Type of Wheelchair: Manual     Max wheelchair distance: 150  Wheel 50 feet with 2 turns activity   Assist Level: Minimal Assistance - Patient > 75%  Wheel 150 feet activity   Assist Level: Minimal Assistance -  Patient > 75%    Refer to Care Plan for Long Term Goals  SHORT TERM GOAL WEEK 1 PT Short Term Goal 1 (Week 1): pt to demonstrate supine<>sit mod I PT Short Term Goal 2 (Week 1): pt to demonstrate functional transfers CGA PT Short Term Goal 3 (Week 1): pt to demonstrate ambulation with LRAD at community distances at [facility] PT Short Term Goal 4 (Week 1): pt to demonstrate dynamic standing balance at [facility]  Recommendations for other services: Therapeutic Recreation  Stress management  Skilled Therapeutic Intervention  Session 1: Evaluation completed (see details above and below) with education on PT POC and goals and individual treatment initiated with focus on  Bed mobility, transfer training, gait training, safety awareness, call light use, sitting and standing balance, car transfers, stair training. pt received in bed and agreeable to therapy. Pt directed in supine>sit supervision without handrails. Pt sat EOB at supervision for doffing/donning personal socks, and min A for donning shoes. Pt directed in Sit to stand to Rolling walker min A, gait training in room with Rolling walker for 30' total with turns min A and PT managing IV pole. Pt [facility], initially pt directed in [facility] mobility 150' completed min A then PT completed distance to gym total A for time and energy. Pt reported pain 5/10 at pressure ulcer site and Lt hamstring but reported he did not need nursing care. Pt denied shortness of breath, chest pains, dizziness throughout session. Pt directed in car transfer with Rolling walker min A for LE management into seat. Pt directed in ramp ascending/descendign with Rolling walker at min A for stability and PT managing IV pole. Pt taken to gym in [facility] total A for time and energy, for stair training, directed in ascending and descending x4 stairs with one rail but intermittently required 2, min A and step through pattern ascending and step to descending. Pt directed in gait training with 400'  Rolling walker at min A with multiple turns and VC for hand placement on walker, walker proximity, step length and trunk extension. Pt returned to room total A in WC, requested to return to bed, min A for Stand pivot transfer with Rolling walker and supervision for sit>supine. Pt left in bed, alarm set,  All needs in reach and in good condition. Call light in hand.    Session 2: pt received in bed and agreeable to therapy. Pt directed in supine>sit supervision, Stand pivot transfer to [facility] with Rolling walker at min A with noted posterior sway but no LOB. Pt taken to gym total  Afor time and energy. Pt reported being very fatigued at start of session but denied pain, shortness of breath, or any additional symptoms. Pt directed in seated BLE strengthening exercises 2# marching, LAQ 2x10 with prolonged rest breaks post each set. O2 and HR monitored to be 57bpm and 97%. Pt then directed in Sit to stand to Rolling walker supervision and gait 400' to room at CGA-min A with PT managing IV pole. Pt requested to remain in recliner at end of session, left in recliner, All needs in reach and in good condition. Call light in hand.  And alarm set.  Mobility Bed Mobility Bed Mobility: Supine to Sit;Sit to Supine;Sitting - Scoot to Edge of Bed Supine to Sit: Supervision/Verbal cueing Sitting - Scoot to Edge of Bed: Supervision/Verbal cueing Sit to Supine: Supervision/Verbal cueing Locomotion  Gait Ambulation: Yes Gait Assistance: Minimal Assistance - Patient > 75% Gait Distance (Feet): 400 Feet Assistive device: Rolling walker Gait Assistance Details: Visual cues/gestures for sequencing;Verbal cues for gait  pattern;Verbal cues for safe use of DME/AE;Verbal cues for sequencing;Verbal cues for technique;Verbal cues for precautions/safety Gait Gait: Yes Gait Pattern: Impaired Gait Pattern: Trunk flexed;Poor foot clearance - left;Poor foot clearance - right;Step-through pattern;Wide base of support Gait velocity:  decreased compared to his normal Stairs / Additional Locomotion Stairs: Yes Stairs Assistance: Minimal Assistance - Patient > 75% Stair Management Technique: One rail Right;Step to pattern Number of Stairs: 4 Ramp: Minimal Assistance - Patient >75% Curb: Minimal Assistance - Patient >75% Wheelchair Mobility Wheelchair Mobility: No (pt will likely not require WC at DC and only utilizes WC for transport at this time.)   Discharge Criteria: Patient will be discharged from PT if patient refuses treatment 3 consecutive times without medical reason, if treatment goals not met, if there is a change in medical status, if patient makes no progress towards goals or if patient is discharged from hospital.  The above assessment, treatment plan, treatment alternatives and goals were discussed and mutually agreed upon: by patient  Junie Panning 05/24/2020, 11:27 AM

## 2020-05-25 ENCOUNTER — Inpatient Hospital Stay (HOSPITAL_COMMUNITY): Payer: No Typology Code available for payment source

## 2020-05-25 ENCOUNTER — Inpatient Hospital Stay (HOSPITAL_COMMUNITY): Payer: No Typology Code available for payment source | Admitting: Occupational Therapy

## 2020-05-25 LAB — COOXEMETRY PANEL
Carboxyhemoglobin: 1.7 % — ABNORMAL HIGH (ref 0.5–1.5)
Methemoglobin: 0.7 % (ref 0.0–1.5)
O2 Saturation: 51.2 %
Total hemoglobin: 9.2 g/dL — ABNORMAL LOW (ref 12.0–16.0)

## 2020-05-25 LAB — BASIC METABOLIC PANEL
Anion gap: 10 (ref 5–15)
BUN: 37 mg/dL — ABNORMAL HIGH (ref 8–23)
CO2: 32 mmol/L (ref 22–32)
Calcium: 8.3 mg/dL — ABNORMAL LOW (ref 8.9–10.3)
Chloride: 96 mmol/L — ABNORMAL LOW (ref 98–111)
Creatinine, Ser: 1.17 mg/dL (ref 0.61–1.24)
GFR, Estimated: >60 mL/min (ref >60)
Glucose, Bld: 114 mg/dL — ABNORMAL HIGH (ref 70–99)
Potassium: 4.1 mmol/L (ref 3.5–5.1)
Sodium: 138 mmol/L (ref 135–145)

## 2020-05-25 MED ORDER — DRONABINOL 2.5 MG PO CAPS
5.0000 mg | ORAL_CAPSULE | Freq: Two times a day (BID) | ORAL | Status: DC
  Administered 2020-05-25 – 2020-05-31 (×13): 5 mg via ORAL
  Filled 2020-05-25 (×13): qty 2

## 2020-05-25 NOTE — Progress Notes (Signed)
Occupational Therapy Session Note  Patient Details  Name: Cody Arellano MRN: [id number] Date of Birth: December 27, 1945  Today's Date: 05/25/2020 OT Individual Time: 5825-1898 OT Individual Time Calculation (min): 46 min    Short Term Goals: Week 1:  OT Short Term Goal 1 (Week 1): STGs=LTGs due to ELOS  Skilled Therapeutic Interventions/Progress Updates:    Pt received sup in bed, bed alarm engaged. Pt agreeable to therapy. Pt used urinal semi-reclined, completed anterior pericare with close supervision at bed level. Pt sup > sitting EOB with close supervision. Donned TEDs with total A from therapist. Donned B gripper socks seated EOB with close supervision. Donned new brief + pants with close supervision. Sit <> stand with RW + CGA for balanc + min VCs to push up from bed. Additionally completed more anterior + posterior peri care in standing with CGA for balance. Amb to sink with RW + CGA for balance and due to decreased energy level. HR EOB read at 54 bpm, 63 bpm after activity. Stood at sink and brushed teeth + washed face with close supervision. Transported to Day room in w/c 2/2 time management + energy conservation. Sit <> stand from w/c with CGA + RW. In prep for improved standing ADL performance, pt putted 2 golf balls with CGA for balance. Pt stated he used to be an avid golfer and is motivated to return. Transported back to room, left in w/c with chair alarm engaged, call bell in reach, and all immediate needs met.   Therapy Documentation Precautions:  Precautions Precautions: Fall Precaution Comments: skin integrity- pressure sore on buttocks, restricted RUE Restrictions Weight Bearing Restrictions: No Other Position/Activity Restrictions: R impella    Vital Signs: See session note   Pain: denies pain   ADL: See Care Tool for more details.   Therapy/Group: Individual Therapy  Volanda Napoleon, MS, OTR/L 05/25/2020, 12:08 PM

## 2020-05-25 NOTE — Progress Notes (Signed)
[facility] PHYSICAL MEDICINE & REHABILITATION PROGRESS NOTE   Subjective/Complaints:  Pt would like to increase Marinol- reports having to force self to eat, drink, but trying to force self- ate 3 pieces of bacon, french toast; ice cream cup, ensure this AM.   BUN increased from 25 to 37 in 1 day.   Pt says wants to continue taking PO- wants to wait on IVFs.   ROS:  Pt denies SOB, abd pain, CP, N/V/C/D, and vision changes   Objective:   No results found. Recent Labs    05/23/20 0330 05/24/20 0615  WBC 5.7 5.8  HGB 8.9* 8.9*  HCT 31.0* 30.1*  PLT 310 308   Recent Labs    05/24/20 0615 05/25/20 0321  NA 138 138  K 4.2 4.1  CL 97* 96*  CO2 32 32  GLUCOSE 89 114*  BUN 25* 37*  CREATININE 1.12 1.17  CALCIUM 8.7* 8.3*    Intake/Output Summary (Last 24 hours) at 05/25/2020 0755 Last data filed at 05/25/2020 [id number] Gross per 24 hour  Intake 715.34 ml  Output 650 ml  Net 65.34 ml     Pressure Injury 05/23/20 Coccyx Lower;Mid Stage 2 -  Partial thickness loss of dermis presenting as a shallow open injury with a red, pink wound bed without slough. has a blister appearance. previously charted as skin tear. (Active)  05/23/20 1630  Location: Coccyx  Location Orientation: Lower;Mid  Staging: Stage 2 -  Partial thickness loss of dermis presenting as a shallow open injury with a red, pink wound bed without slough.  Wound Description (Comments): has a blister appearance. previously charted as skin tear.  Present on Admission: Yes    Physical Exam: Vital Signs Blood pressure 106/61, pulse (!) 55, temperature 98.6 F (37 C), temperature source Oral, resp. rate 16, height 6' (1.829 m), weight 61 kg, SpO2 97 %.   Physical Exam General: pt awake, sitting up in bed- gaunt, NAD HEENT: conjugate gaze Heart: bradycardic, irregular rhythm Chest: CTA B/L- no W/R/R- good air movement Abdomen: Soft, NT, ND, (+)BS  Extremities: No clubbing, cyanosis, or edema. Pulses are  2+ Skin: Clean and intact without signs of breakdown Neuro: Pt is cognitively appropriate with normal insight, memory, and awareness. Cranial nerves 2-12 are intact. Patient is alert sitting up in bed.  Oriented x3 and follows commands. No tremors. Motor function is grossly 4/5.  Musculoskeletal: Full ROM, No pain with AROM or PROM in the neck, trunk, or extremities. Posture appropriate Psych: flat affect    Assessment/Plan: 1. Functional deficits which require 3+ hours per day of interdisciplinary therapy in a comprehensive inpatient rehab setting.  Physiatrist is providing close team supervision and 24 hour management of active medical problems listed below.  Physiatrist and rehab team continue to assess barriers to discharge/monitor patient progress toward functional and medical goals  Care Tool:  Bathing    Body parts bathed by patient: Right arm,Left arm,Chest,Abdomen,Front perineal area,Right upper leg,Left upper leg,Right lower leg,Left lower leg,Face         Bathing assist Assist Level: Supervision/Verbal cueing     Upper Body Dressing/Undressing Upper body dressing   What is the patient wearing?: Hospital gown only    Upper body assist Assist Level: Supervision/Verbal cueing    Lower Body Dressing/Undressing Lower body dressing      What is the patient wearing?: Pants,Incontinence brief     Lower body assist Assist for lower body dressing: Supervision/Verbal cueing     Toileting Toileting  Toileting assist Assist for toileting: Independent with assistive device Assistive Device Comment: urinal   Transfers Chair/bed transfer  Transfers assist     Chair/bed transfer assist level: Minimal Assistance - Patient > 75%     Locomotion Ambulation   Ambulation assist      Assist level: Minimal Assistance - Patient > 75% Assistive device: Walker-rolling Max distance: 400   Walk 10 feet activity   Assist     Assist level: Minimal Assistance -  Patient > 75% Assistive device: Walker-rolling   Walk 50 feet activity   Assist    Assist level: Minimal Assistance - Patient > 75% Assistive device: Walker-rolling    Walk 150 feet activity   Assist    Assist level: Minimal Assistance - Patient > 75% Assistive device: Walker-rolling    Walk 10 feet on uneven surface  activity   Assist Walk 10 feet on uneven surfaces activity did not occur: Safety/medical concerns         Wheelchair     Assist Will patient use wheelchair at discharge?: No Type of Wheelchair: Manual      Max wheelchair distance: 150    Wheelchair 50 feet with 2 turns activity    Assist        Assist Level: Minimal Assistance - Patient > 75%   Wheelchair 150 feet activity     Assist      Assist Level: Minimal Assistance - Patient > 75%   Blood pressure 106/61, pulse (!) 55, temperature 98.6 F (37 C), temperature source Oral, resp. rate 16, height 6' (1.829 m), weight 61 kg, SpO2 97 %.  Medical Problem List and Plan: 1.  Debility secondary to acute on chronic systolic congestive heart failure/cardiogenic shock/ischemic cardiomyopathy status post Impella insertion 05/03/2020 with removal of Impella assistive device 05/16/2020  12/21- has PICC RUE for Milrinone at home  12/22- will speak to Cardiology to see if can d/c Friday or not and get Milrinone set up for d/c.              -patient may shower             -ELOS/Goals: modI 5-7 days 2.  Antithrombotics: -DVT/anticoagulation: Eliquis             -antiplatelet therapy: N/A 3. Pain Management: Oxycodone as needed- has not used in 5 days, may d/c. Main source of pain is sacral pressure injury- offload at least q2H to prevent pain and worsening of injury.  4. Mood: Provide emotional support             -antipsychotic agents: N/A 5. Neuropsych: This patient is capable of making decisions on his own behalf. 6. Skin/Wound Care: Routine skin checks 12/21- keep off sacral  decub- needs to eat MORE protein/calories to heal sacral decub-  7. Fluids/Electrolytes/Nutrition: Routine in and outs with follow-up chemistries. Electrolytes stable on 12/20: monitor weekly. 8.  Atrial fibrillation/ischemic cardiomyopathy/chronic systolic congestive heart failure/history of PE.  Status post DCCV 05/10/2020.  Continue Lanoxin 0.125 mg daily, amiodarone 200 mg  daily, ProAmatine 2.5 mg 3 times daily, milrinone as directed, Aldactone 12.5 mg daily .  Followed closely by cardiology services.  12/21- on Milrinone gtt- to go home on this- has PICC;   12/22- will see if can get set up by Friday 9.  SLE.  Imuran 100 mg twice daily, Plaquenil 400 mg nightly.  Follow-up Dr. Gavin Pound rheumatology services. 10.  Hyperlipidemia.  Crestor 11.  BPH.  Flomax 0.4 mg daily.  Check PVR 12. Tachypneic to 23 on 12/20: monitor RR TID.   13. Poor appetite/Severe malnutrition  12/21- has lost >30 lbs in last 6 months- on marinol 2.5 mg BID- might increase after speaking with palliative care- has living will- is full code. Will order calorie count on pt.   12/22- will increase marinol to 5 mg BID per pt request.     LOS: 2 days A FACE TO FACE EVALUATION WAS PERFORMED  Dolly Harbach 05/25/2020, 7:55 AM

## 2020-05-25 NOTE — Progress Notes (Signed)
Palliative:  HPI:74 y.o.malewith past medical history of SLE, interstitial lung disease, coronary artery disease, chronic systolic heart failure, CABG 10/2019, paroxysmal atrial fibrillation on Eliquis, recent PEadmitted on 11/23/2021with shortness of breath related to cardiogenic shock with CHF EF 20-25%, severe MR/TR, afib RVR.Impella placed 11/30. Cortrak placed 12/3.Impella and Cortrak removed and now in CIR for intensive rehab.   Cody Arellano is sleeping soundly this morning. I called and spoke with his wife, Cody Arellano, and son, Cody Arellano. They had more questions about his progress and where we go from here. They had good questions and we discussed CHF trajectory and that Cody Arellano does seem to be making some progress in his physical ability to ambulate and be active and is trying very hard to eat better. His intake has improved but his appetite remains poor and he is forcing himself to eat. However, this is improved from when I last saw him in ICU and his spirits seems improved. I did express that he is in a better place and more stable at this time but I do worry that his increased physical strength does not necessarily reflect improvement in his heart function and this will continue to be a barrier and serious problem for Cody Arellano moving forward.   We also discussed a variety of scenarios for what this could look like for him after CIR. We discussed milrinone that this may be needed indefinitely as long as the benefits outweigh the risks (this would be up to heart failure team for recommendations). I know Cody Arellano is hopeful that he may be considered for LVAD and we did discuss why he needs to be stronger and in better shape in order to tolerate LVAD surgery and recovery which is a significant challenge for patients much stronger than Cody Arellano. They are also aware this may not be an option for him. We also discussed the possibility of a plateau in health and guidance from heart failure team to know when all  efforts have been made to help Cody Arellano to improve AND/OR if Cody Arellano himself expresses desire to focus on comfort and quality at home with his family then hospice at home is an option for him as well.   They had many questions regarding ongoing therapy and care following CIR. I answered to best of my ability but CIR will recommend any further therapy efforts following discharge based on his progress and it is too soon to know what he may need at that time. I did share that close follow up with heart failure team will be needed. All questions concerns addressed to the best of my ability. Emotional support provided.   Exam: Sleeping comfortably in bed. No distress. Cachectic. Breathing regular, unlabored. Milrinone in place. Moves all extremities.   Plan: - Very good support in place from family.  - Hopeful for return home once therapy is maximized (family hoping that he can get maximum benefits from CIR stay before returning home).  - Hopeful for ongoing improvement and consideration of LVAD in future. We have also discussed option of hospice as needed in the future as well.    [place], NP Palliative Medicine Team Pager [phone] (Please see [email or web address] for schedule) Team Phone [phone]    Greater than 50%  of this time was spent counseling and coordinating care related to the above assessment and plan

## 2020-05-25 NOTE — Progress Notes (Signed)
Occupational Therapy Session Note  Patient Details  Name: Cody Arellano MRN: [id number] Date of Birth: January 21, 1946  Today's Date: 05/25/2020 OT Individual Time: 6759-1638 OT Individual Time Calculation (min): 46 min    Short Term Goals: Week 1:  OT Short Term Goal 1 (Week 1): STGs=LTGs due to ELOS  Skilled Therapeutic Interventions/Progress Updates:    Pt received in w/c with chair alarm engaged. Completed 10 sit<>stands with close supervision from w/c to improve activity tolerance. Pt with no c/o fatigue. HR at 63 bpm after activity. Transported to ortho gym in w/c 2/2 time + for energy conservation. In standing with CGA for balance, putted golf balls for ~20 min to improve dynamic standing balance + activity tolerance for improved ADL/IADL performance. Cont to reports no significant fatigue.Transported back to room, stand-pivot from w/c to bed with CGA. Sit EOB > sup with close supervision. Left in bed with HOB at 30*, bed alarm engaged, call bell in reach, IV plugged in and all immediate needs met.   Therapy Documentation Precautions:  Precautions Precautions: Fall Precaution Comments: skin integrity- pressure sore on buttocks, restricted RUE Restrictions Weight Bearing Restrictions: No Other Position/Activity Restrictions: R impella    Vital Signs: see session note  Pain: denies pains   ADL: See Care Tool for more details.  Therapy/Group: Individual Therapy  Volanda Napoleon, MS , OTR/L 05/25/2020, 1:54 PM

## 2020-05-25 NOTE — Progress Notes (Addendum)
Advanced Heart Failure Rounding Note  PCP-Cardiologist: Mertie Moores, MD   Subjective:    Discharged to CIR 12/20  On milrinone 0.125. Early am Co-ox 51%.   Wt stable/unchanged at 134 lb.   BP, SCr and K stable today.   Completed 4 PT sessions today, feels he is making progress. Looks well. No cardiac complaints. Wife at bedside.   Objective:   Weight Range: 61 kg Body mass index is 18.24 kg/m.   Vital Signs:   Temp:  [97.5 F (36.4 C)-98.9 F (37.2 C)] 97.9 F (36.6 C) (12/22 0924) Pulse Rate:  [55-106] 106 (12/22 0924) Resp:  [16-20] 20 (12/22 0924) BP: (102-136)/(56-104) 136/104 (12/22 0924) SpO2:  [96 %-99 %] 96 % (12/22 0924) Weight:  [61 kg] 61 kg (12/22 0632) Last BM Date: 05/23/20  Weight change: Filed Weights   05/24/20 0500 05/25/20 0632  Weight: 61 kg 61 kg    Intake/Output:   Intake/Output Summary (Last 24 hours) at 05/25/2020 1436 Last data filed at 05/25/2020 0634 Gross per 24 hour  Intake 228.34 ml  Output 500 ml  Net -271.66 ml      Physical Exam   PHYSICAL EXAM: General:  Well appearing, thin AAM. No respiratory difficulty HEENT: normal Neck: supple. JVD ~8 cm. Carotids 2+ bilat; no bruits. No lymphadenopathy or thyromegaly appreciated. Cor: PMI nondisplaced. Regular rate & rhythm. 3/6 MR murmur at apex Lungs: clear Abdomen: soft, nontender, nondistended. No hepatosplenomegaly. No bruits or masses. Good bowel sounds. Extremities: no cyanosis, clubbing, rash, edema + RUE PICC  Neuro: alert & oriented x 3, cranial nerves grossly intact. moves all 4 extremities w/o difficulty. Affect pleasant.    Telemetry   N/A   EKG    No new EKG to review today   Labs    CBC Recent Labs    05/23/20 0330 05/24/20 0615  WBC 5.7 5.8  NEUTROABS  --  3.3  HGB 8.9* 8.9*  HCT 31.0* 30.1*  MCV 93.7 94.4  PLT 310 219   Basic Metabolic Panel Recent Labs    05/24/20 0615 05/25/20 0321  NA 138 138  K 4.2 4.1  CL 97* 96*  CO2 32  32  GLUCOSE 89 114*  BUN 25* 37*  CREATININE 1.12 1.17  CALCIUM 8.7* 8.3*   Liver Function Tests Recent Labs    05/24/20 0615  AST 30  ALT 32  ALKPHOS 63  BILITOT 0.7  PROT 6.0*  ALBUMIN 2.4*   No results for input(s): LIPASE, AMYLASE in the last 72 hours. Cardiac Enzymes No results for input(s): CKTOTAL, CKMB, CKMBINDEX, TROPONINI in the last 72 hours.  BNP: BNP (last 3 results) Recent Labs    04/06/20 2134 04/26/20 1319 04/30/20 0248  BNP 1,677.6* 1,824.4* 1,685.5*    ProBNP (last 3 results) No results for input(s): PROBNP in the last 8760 hours.   D-Dimer No results for input(s): DDIMER in the last 72 hours. Hemoglobin A1C No results for input(s): HGBA1C in the last 72 hours. Fasting Lipid Panel No results for input(s): CHOL, HDL, LDLCALC, TRIG, CHOLHDL, LDLDIRECT in the last 72 hours. Thyroid Function Tests No results for input(s): TSH, T4TOTAL, T3FREE, THYROIDAB in the last 72 hours.  Invalid input(s): FREET3  Other results:   Imaging    No results found.   Medications:     Scheduled Medications: . (feeding supplement) PROSource Plus  30 mL Oral BID BM  . amiodarone  200 mg Oral Daily  . apixaban  5 mg Oral  BID  . aspirin EC  81 mg Oral Daily  . azaTHIOprine  100 mg Oral BID  . Chlorhexidine Gluconate Cloth  6 each Topical BID  . dapagliflozin propanediol  10 mg Oral Daily  . digoxin  0.125 mg Oral Daily  . docusate sodium  100 mg Oral BID  . dronabinol  5 mg Oral BID AC  . feeding supplement  237 mL Oral TID BM  . hydroxychloroquine  400 mg Oral QHS  . midodrine  2.5 mg Oral TID WC  . multivitamin with minerals  1 tablet Oral Daily  . polyvinyl alcohol  2 drop Both Eyes Daily  . rosuvastatin  20 mg Oral Daily  . sodium chloride flush  10-40 mL Intracatheter Q12H  . spironolactone  12.5 mg Oral Daily  . tamsulosin  0.4 mg Oral Daily  . torsemide  20 mg Oral Daily    Infusions: . milrinone 0.125 mcg/kg/min (05/25/20 0806)    PRN  Medications: acetaminophen, bisacodyl, polyethylene glycol, sodium chloride flush, white petrolatum    Assessment/Plan   1. 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  Echo this admission looks worse than 10/21 with EF 20-25% with septal akinesis, mid to apical inferior akinesis, apical anterior and apex akinesis, moderately dilated and dysfunctional RV, moderate TR, severe MR, bicuspid aortic valve with no stenosis, mild aortic insufficiency. Progressive/end-stage CHF worsened by onset of atrial fibrillation. Cardiogenic shock 11/30 with co-ox down to 30%, Impella 5.5 placed to allow time to stabilize and consider further steps.  Impella extracted 12/13. 0.125 milrinone added post explant for marginal co-ox at 51%. Remains on milrinone 0.125. Co-ox 51% today.  He has end-stage biventricular HF. He is not a transplant candidate.  Barriers to LVAD include nutritional status/deconditioning and RV.  I think RV would be acceptable. We discussed in [place], not LVAD candidate at this point with malnutrition/deconditioning. He is now in [place] for aggressive PT. Nutritional status improving. Will continue on milrinone as bridge to VAD to allow time to improve nutritional status and mobility. - Continue milrinone 0.125 mcg/kg/min. Follow co-ox if further decline may need titration to 0.25.  - Volume status stable. Continue torsemide 20 daily.  - Off losartan with low BP.  - Cont midodrine 2.5 tid - Continue Spiro 12.5 daily  - Continue digoxin 0.125  - Continue Farxiga 10 mg  2. CAD: Prior history of MI with LAD and ramus PCI. AdmittedMay 2021with anterior STEMI. LHC with occluded ostial LAD (in-stent), 90% ostial ramus, 60-70% proximal LCx, nondominant RCA. PTCA to LAD and ramus to restore flow, then CABG with LIMA-LAD and SVG-ramus.  No chest pain, doubt ACS.  Fall in EF from 10/21 to 11/21, but he did not present with ACS.  [place] 12/1 showed patient LIMA-LAD, patent native LAD,  patent dominant LCx.  The SVG-ramus is occluded with severe diffuse in-stent restenosis in proximal ramus.  Ramus not intervened upon as this would be a complex intervention and unlikely to markedly improve EF.  - No s/s angina - Continue ASA 81 and statin.    3. AKI: Creatinine now trending down with improved cardiac output. Renal function normalized.  Cr 1.12 today 4. Right pleural effusion: S/p thoracentesis, transudative (CHF).  5. Atrial fibrillation/flutter with RVR: DCCV on 12/3, went back to NSR 12/7. RRR on exam today  - Continue amiodarone 200 mg once daily  - Continue apixaban 5 mg bid  6. Mitral regurgitation:  TEE 12/3 with moderate central MR.  Doubt MitraClip will  help him much with moderate MR and he is likely too advanced.  7. H/o PE: In 10/21.  Keep anticoagulated, on apixaban. No bleeding  8. Bicuspid aortic valve: Mild AI, no AS.  9. SLE: H/o pericarditis.  On Imuran and hydroxychloroquine.  - Restarted home SLE regimen now that he is taking po.   10. Pulmonary nodules/emphysema: CCM following, s/p bronch/BAL (no growth).  11. ID: Concern for right-sided PNA, ?aspiration.  Cultures NGTD.  Now afebrile and has completed course of vancomycin/cefepime.   - no change 12. Thrombocytopenia: Resolved.  May have been due to critical illness, low grade hemolysis with Impella. LDH ok.  HIT negative.  - Back on apixaban.  13. Hypokalemia/hypomag: Supplement as needed. K 4.1 today 14. Acute blood loss anemia: No obvious site.   - Hgb now stable and up-trending, 8.9 yesterday - Transfuse hgb < 8.  15. VT: Episode early am 12/7, required DCCV.  No recurrence.  16. Deconditioning: - continue Therapy in CIR. Greatly appreciate their assistance     Length of Stay: 2  Lyda Jester, PA-C  05/25/2020, 2:36 PM  Advanced Heart Failure Team Pager [phone] (M-F; 7a - 4p)  Please contact [facility] for night-coverage after hours (4p -7a ) and weekends on [email or web address]  Patient  seen and examined with the above-signed Advanced Practice Provider and/or Housestaff. I personally reviewed laboratory data, imaging studies and relevant notes. I independently examined the patient and formulated the important aspects of the plan. I have edited the note to reflect any of my changes or salient points. I have personally discussed the plan with the patient and/or family.  Remains tenuous. Co-ox 51% on milrinone. Making progress with PT however. Maintaining NSR. Volume status and labs stable.  General:  Frail appearing  No resp difficulty HEENT: normal Neck: supple. no JVD. Carotids 2+ bilat; no bruits. No lymphadenopathy or thryomegaly appreciated. Cor: PMI nondisplaced. Regular rate & rhythm. No rubs, gallops or murmurs. Lungs: clear Abdomen: soft, nontender, nondistended. No hepatosplenomegaly. No bruits or masses. Good bowel sounds. Extremities: no cyanosis, clubbing, rash, edema Neuro: alert & orientedx3, cranial nerves grossly intact. moves all 4 extremities w/o difficulty. Affect pleasant  Continue current plan with milrinone support. Appreciate CIR care. We will continue to follow.   Glori Bickers, MD  4:35 PM

## 2020-05-25 NOTE — Progress Notes (Signed)
Physical Therapy Session Note  Patient Details  Name: Cody Arellano MRN: [id number] Date of Birth: 03-04-1946  Today's Date: 05/25/2020 PT Individual Time: 0800-0900; 1410-1515 PT Individual Time Calculation (min): 60 min , 65 min  Short Term Goals: Week 1:  PT Short Term Goal 1 (Week 1): pt to demonstrate supine<>sit mod I PT Short Term Goal 2 (Week 1): pt to demonstrate functional transfers CGA PT Short Term Goal 3 (Week 1): pt to demonstrate ambulation with LRAD at community distances at [facility] PT Short Term Goal 4 (Week 1): pt to demonstrate dynamic standing balance at [facility]  Skilled Therapeutic Interventions/Progress Updates:  tx 1:  Pt resting in bed.  He denied pain.  Resting HR 62, O2 sats 99%.  Supine iwht HOB raised> sitting with supervision.  IV in place.   PT doffed gown.  Pt donned T shirt with cut -out for IV, and pants with sit> stand, CGA after set- up. Cues for sit>< stand safely due to IV. Stand pivot to wc with CGA.  Ambulation for endurance,  with RW x 400' with CGA.  As pt turned to sit at end of distance, he had mild LOB, although he stated he was not "too tired".  Use of bil LEs to propel w/c for endurance, x 50' with supervision and cues.  Up/down 8" high curb/step, RW, which pt stated was approx height of step from his garage into house, with CGA, min cues for technique.  HR 62 after step.  Seated self -stretch bil hamstrings and heel cords, using 9" high footstool, x 30 seconds x 3 R/LLEs.  Ambulation for endurance , returning to room, CGA with RW, x 150'.  At end of session, pt resting in bed at his request.  Bed alarm set and needs at hand.  tx 2:  Pt resting in bed; he reported that he was a "little tired".  He denied pain.   Therapeutic exercise performed with LE to increase strength for functional mobility: supine with HOB raised- 2 x 10 bil adductor squeezes with pelvic tilt.  R/L side lying for L/R hip abduction with flexed hips and knees. Seated:  15 x 1  bil heel lifts. Standing: 15 x 1 mini squats, R/L hip abduction focusing on gluteal activation.   Supine> sit independently, with HOB raised.  Pt reported that he would like to urinate.  Gait in room iwht RW to toilet., CG/close supervision.  Pt stood to urinate, with CGA.  Hand washing at sink with visual cues.  Seated self- stretch using foot stool, x 30 seconds x 3 R/L hamstrings and heel cords.  Pt did not remember process from AM tx. Sustained stretch bil hamstrings and heel cord, standing with forefeet on red wedge, x 1 minute x 2, with bil UE support> UE support. HR 73 during standing stretch.  Ambulation for activity tolerance, on level tile with RW, CGA> close supervision, x 250' x 2 during session.  Advanced gait training with RW on level tile x 20' weaving in/out of obstacle course, safely with CGA.  At end of session, pt resting in bed with alarm set and needs at hand. HR 60-62 BPM.       Therapy Documentation Precautions:  Precautions Precautions: Fall Precaution Comments: skin integrity- pressure sore on buttocks, restricted RUE Restrictions Weight Bearing Restrictions: No Other Position/Activity Restrictions: R impella        Therapy/Group: Individual Therapy  Renn Dirocco 05/25/2020, 10:17 AM

## 2020-05-26 ENCOUNTER — Inpatient Hospital Stay (HOSPITAL_COMMUNITY): Payer: No Typology Code available for payment source | Admitting: Occupational Therapy

## 2020-05-26 ENCOUNTER — Inpatient Hospital Stay (HOSPITAL_COMMUNITY): Payer: No Typology Code available for payment source

## 2020-05-26 LAB — FUNGAL ORGANISM REFLEX

## 2020-05-26 LAB — COOXEMETRY PANEL
Carboxyhemoglobin: 1.8 % — ABNORMAL HIGH (ref 0.5–1.5)
Methemoglobin: 0.8 % (ref 0.0–1.5)
O2 Saturation: 51.7 %
Total hemoglobin: 9.3 g/dL — ABNORMAL LOW (ref 12.0–16.0)

## 2020-05-26 LAB — FUNGUS CULTURE RESULT

## 2020-05-26 LAB — FUNGUS CULTURE WITH STAIN

## 2020-05-26 MED ORDER — SENNA 8.6 MG PO TABS
2.0000 | ORAL_TABLET | Freq: Every day | ORAL | Status: DC
  Administered 2020-05-26 – 2020-05-27 (×2): 17.2 mg via ORAL
  Filled 2020-05-26 (×2): qty 2

## 2020-05-26 MED ORDER — SORBITOL 70 % SOLN
30.0000 mL | Freq: Once | Status: AC
  Administered 2020-05-26: 16:00:00 30 mL via ORAL
  Filled 2020-05-26: qty 30

## 2020-05-26 MED ORDER — ONDANSETRON 4 MG PO TBDP
4.0000 mg | ORAL_TABLET | Freq: Three times a day (TID) | ORAL | Status: DC | PRN
  Administered 2020-05-26: 13:00:00 4 mg via ORAL
  Filled 2020-05-26: qty 1

## 2020-05-26 NOTE — Progress Notes (Signed)
Occupational Therapy Session Note  Patient Details  Name: Cody Arellano MRN: [id number] Date of Birth: Jun 20, 1945  Today's Date: 05/26/2020 OT Individual Time: [phone] and [id number] OT Individual Time Calculation (min): 54 min and 26 min   Short Term Goals: Week 1:  OT Short Term Goal 1 (Week 1): STGs=LTGs due to ELOS   Skilled Therapeutic Interventions/Progress Updates:    pt greeted at time of session supine in bed with IV running, pt agreeable to OT session no pain throughout. Minimal fatigue noted at beginning of session. Bed mobility supine to sit Supervision and walked bed > sink level with CGA/close suprevision with RW. Performed UB/LB bathing at sink level with Supervision for UB and CGA for LB for sit to stand level, declined to wash past knee level but simulated with figure four to ensure he could reach if needed. UB dress with gown to maneuver around IV pole, educated that he can change shirts at home when IV is not running so he does not have to cut any more shirts. LB dress with CGA able to don over hips in standing and thread without assist. Reviewed energy conservation techniques as well when at home for dressing and other ADL tasks. Pt had questions regarding HH and questions answered to best of ability, recommended asking MD as well regarding IV meds at home. Transported to/from kitchen via wheelchair for time management and continued to review energy conservation and IADL retraining tips. Transported back to room in same manner as above, stand pivot to bed CGA with RW and alarm on call bell in reach, IV running.   Session 2: Pt greeted at time of session supine in bed resting agreeable to OT session, supine <> sit supervision and stand pivot to wheelchair no AD with assist to manage IV pole, hand held assist. Pt transported to gym for time management and performed BUE there ex with light resistance to maintain light use of RUE for reps of 12 for bicep curl, chest press, and  circles to improve overall endurance and provide change of scenery from his room for uplifting mood. Stand pivot back to bed no AD CGA, alarm on call bell in reach. Note HR and O2 WNL throughout session and no c/o fatigue.   Therapy Documentation Precautions:  Precautions Precautions: Fall Precaution Comments: skin integrity- pressure sore on buttocks, restricted RUE Restrictions Weight Bearing Restrictions: No RUE Weight Bearing: Partial weight bearing Other Position/Activity Restrictions: R impella     Therapy/Group: Individual Therapy  Viona Gilmore 05/26/2020, 8:29 AM

## 2020-05-26 NOTE — IPOC Note (Signed)
[facility] PHYSICAL MEDICINE & REHABILITATION PROGRESS NOTE   Overall Plan of Care (IPOC) Patient Details Name: FELICE DEEM MRN: [id number] DOB: 1945/10/31  Admitting Diagnosis: [facility] Problems: Principal Problem:   Debility Active Problems:   Goals of care, counseling/discussion   Chronic systolic heart failure ([place])   Pressure injury of skin   Palliative care by specialist     Functional Problem List: Nursing Edema,Endurance,Motor,Nutrition,Safety,Medication Management,Bowel,Bladder,Skin Integrity  PT Balance,Pain,Safety,Endurance,Sensory,Skin Integrity  OT Balance,Safety,Cognition,Skin Integrity,Endurance  SLP    TR         Basic ADLs: OT Grooming,Bathing,Dressing,Toileting     Advanced  ADLs: OT       Transfers: PT Bed Mobility,Bed to [employer]  OT Toilet,Tub/Shower     Locomotion: PT Ambulation,Stairs     Additional Impairments: OT    SLP        TR      Anticipated Outcomes Item Anticipated Outcome  Self Feeding    Swallowing      Basic self-care  supervision- mod I  Toileting  mod I   Bathroom Transfers supervision  Bowel/Bladder  manage bowel and bladder with mod I assist  Transfers  supervision  Locomotion  supervision  Communication     Cognition     Pain  remain free of pain  Safety/Judgment  remain free of injury, prevent fall with mod I assist   Therapy Plan: PT Intensity: Minimum of 1-2 x/day ,45 to 90 minutes PT Frequency: 5 out of 7 days PT Duration Estimated Length of Stay: 7-10 days OT Intensity: Minimum of 1-2 x/day, 45 to 90 minutes OT Frequency: 5 out of 7 days OT Duration/Estimated Length of Stay: 7-10 days     Due to the current state of emergency, patients may not be receiving their 3-hours of Medicare-mandated therapy.   Team Interventions: Nursing Interventions Patient/Family Education,Pain Management,Bladder Management,Bowel Management,Skin Care/Wound Management,Discharge  Planning,Psychosocial Support,Disease Management/Prevention,Medication Management  PT interventions Ambulation/gait training,Balance/vestibular training,Community reintegration,Cognitive remediation/compensation,DME/adaptive equipment instruction,Discharge planning,Disease management/prevention,Neuromuscular re-education,Patient/family education,Skin care/wound management,Stair training,Therapeutic Exercise,UE/LE Coordination activities,Visual/perceptual remediation/compensation,UE/LE Strength taining/ROM,Therapeutic Activities,Splinting/orthotics,Psychosocial support,Pain management,Functional mobility training  OT Interventions Balance/vestibular training,Discharge planning,Self Care/advanced ADL retraining,Therapeutic Activities,Disease mangement/prevention,Functional mobility training,Skin care/wound managment,Patient/family education,Therapeutic Exercise,DME/adaptive equipment instruction,Psychosocial support,UE/LE Strength taining/ROM,Wheelchair propulsion/positioning  SLP Interventions    TR Interventions    SW/CM Interventions Discharge Planning,Psychosocial Support,Patient/Family Education   Barriers to Discharge MD  Medical stability, Home enviroment access/loayout, Wound care, Nutritional means and IV milrinone  Nursing      PT Inaccessible home environment no handrial to enter home, bed/bath on 2nd level  OT Home environment access/layout bedroom and full bathroom on second floor; pt reports first floor has half bath and would be able to create first floor setup if needed  SLP      SW       Team Discharge Planning: Destination: PT-Home ,OT- Home , SLP-  Projected Follow-up: PT-Home health PT, OT-  24 hour supervision/assistance,Home health OT, SLP-  Projected Equipment Needs: PT-Rolling walker with 5" wheels, OT- To be determined, SLP-  Equipment Details: PT- , OT-  Patient/family involved in discharge planning: PT- Patient,  OT-Patient, SLP-   MD ELOS: 7-10 days Medical Rehab  Prognosis:  Good Assessment: Pt is a 74 yr old male with debility due to CHF/Ischemic CMO, cardiogenic shock- IV milrinone, Afib, sacral decub that came with, poor appetite- on Marinol 5 mg BID, and constipation.   Goals supervision by d/c.     See Team Conference Notes for weekly updates to the plan of  care

## 2020-05-26 NOTE — Progress Notes (Signed)
[facility] PHYSICAL MEDICINE & REHABILITATION PROGRESS NOTE   Subjective/Complaints:   Constipated- LBM 2 days ago-  Says appetite hasn't changed much so far- did increase Marinol yesterday Willing for [place] to increase Bowel meds- will give Sorbitol x1 to get him to have BM,   ROS:  Pt denies SOB, abd pain, CP, N/V/C/D, and vision changes   Objective:   No results found. Recent Labs    05/24/20 0615  WBC 5.8  HGB 8.9*  HCT 30.1*  PLT 308   Recent Labs    05/24/20 0615 05/25/20 0321  NA 138 138  K 4.2 4.1  CL 97* 96*  CO2 32 32  GLUCOSE 89 114*  BUN 25* 37*  CREATININE 1.12 1.17  CALCIUM 8.7* 8.3*    Intake/Output Summary (Last 24 hours) at 05/26/2020 1020 Last data filed at 05/26/2020 0720 Gross per 24 hour  Intake 180 ml  Output 1025 ml  Net -845 ml     Pressure Injury 05/23/20 Coccyx Lower;Mid Stage 2 -  Partial thickness loss of dermis presenting as a shallow open injury with a red, pink wound bed without slough. has a blister appearance. previously charted as skin tear. (Active)  05/23/20 1630  Location: Coccyx  Location Orientation: Lower;Mid  Staging: Stage 2 -  Partial thickness loss of dermis presenting as a shallow open injury with a red, pink wound bed without slough.  Wound Description (Comments): has a blister appearance. previously charted as skin tear.  Present on Admission: Yes    Physical Exam: Vital Signs Blood pressure 112/63, pulse (!) 57, temperature 98.1 F (36.7 C), temperature source Oral, resp. rate 15, height 6' (1.829 m), weight 61 kg, SpO2 100 %.   Physical Exam General: pt awake, alert, sitting up in bed, appropriate, gaunt, NAD HEENT: conjugate gaze Heart: bradycardic, occ extra beat Chest: CTA B/L- no W/R/R- good air movement Abdomen: Soft, NT, ND, (+)BS   Extremities: No clubbing, cyanosis, or edema. Pulses are 2+ Skin: Clean and intact without signs of breakdown Neuro: Pt is cognitively appropriate with normal  insight, memory, and awareness. Cranial nerves 2-12 are intact. Patient is alert sitting up in bed.  Oriented x3 and follows commands. No tremors. Motor function is grossly 4/5.  Musculoskeletal: Full ROM, No pain with AROM or PROM in the neck, trunk, or extremities. Posture appropriate Psych: flat affect    Assessment/Plan: 1. Functional deficits which require 3+ hours per day of interdisciplinary therapy in a comprehensive inpatient rehab setting.  Physiatrist is providing close team supervision and 24 hour management of active medical problems listed below.  Physiatrist and rehab team continue to assess barriers to discharge/monitor patient progress toward functional and medical goals  Care Tool:  Bathing    Body parts bathed by patient: Front perineal area,Buttocks,Right arm,Left arm,Chest,Abdomen,Right upper leg,Left upper leg,Face     Body parts n/a: Right arm,Left arm,Chest,Abdomen,Left lower leg,Face,Right lower leg,Left upper leg,Right upper leg (pt declined)   Bathing assist Assist Level: Contact Guard/Touching assist     Upper Body Dressing/Undressing Upper body dressing   What is the patient wearing?: Hospital gown only    Upper body assist Assist Level: Supervision/Verbal cueing    Lower Body Dressing/Undressing Lower body dressing      What is the patient wearing?: Pants,Underwear/pull up     Lower body assist Assist for lower body dressing: Supervision/Verbal cueing     Toileting Toileting    Toileting assist Assist for toileting: Minimal Assistance - Patient > 75% Assistive  Device Comment: urinal   Transfers Chair/bed transfer  Transfers assist     Chair/bed transfer assist level: Contact Guard/Touching assist     Locomotion Ambulation   Ambulation assist      Assist level: Contact Guard/Touching assist Assistive device: Walker-rolling Max distance: 400   Walk 10 feet activity   Assist     Assist level: Contact Guard/Touching  assist Assistive device: Walker-rolling   Walk 50 feet activity   Assist    Assist level: Contact Guard/Touching assist Assistive device: Walker-rolling    Walk 150 feet activity   Assist    Assist level: Contact Guard/Touching assist Assistive device: Walker-rolling    Walk 10 feet on uneven surface  activity   Assist Walk 10 feet on uneven surfaces activity did not occur: Safety/medical concerns         Wheelchair     Assist Will patient use wheelchair at discharge?: No Type of Wheelchair: Manual      Max wheelchair distance: 150    Wheelchair 50 feet with 2 turns activity    Assist        Assist Level: Minimal Assistance - Patient > 75%   Wheelchair 150 feet activity     Assist      Assist Level: Minimal Assistance - Patient > 75%   Blood pressure 112/63, pulse (!) 57, temperature 98.1 F (36.7 C), temperature source Oral, resp. rate 15, height 6' (1.829 m), weight 61 kg, SpO2 100 %.  Medical Problem List and Plan: 1.  Debility secondary to acute on chronic systolic congestive heart failure/cardiogenic shock/ischemic cardiomyopathy status post Impella insertion 05/03/2020 with removal of Impella assistive device 05/16/2020  12/21- has PICC RUE for Milrinone at home  12/22- will speak to Cardiology to see if can d/c Friday or not and get Milrinone set up for d/c.  12/23- not able to arrange til next week- d/c date 12/30              -patient may shower             -ELOS/Goals: modI 5-7 days 2.  Antithrombotics: -DVT/anticoagulation: Eliquis             -antiplatelet therapy: N/A 3. Pain Management: Oxycodone as needed- has not used in 5 days, may d/c. Main source of pain is sacral pressure injury- offload at least q2H to prevent pain and worsening of injury.  4. Mood: Provide emotional support             -antipsychotic agents: N/A 5. Neuropsych: This patient is capable of making decisions on his own behalf. 6. Skin/Wound Care:  Routine skin checks 12/21- keep off sacral decub- needs to eat MORE protein/calories to heal sacral decub-  7. Fluids/Electrolytes/Nutrition: Routine in and outs with follow-up chemistries. Electrolytes stable on 12/20: monitor weekly. 8.  Atrial fibrillation/ischemic cardiomyopathy/chronic systolic congestive heart failure/history of PE.  Status post DCCV 05/10/2020.  Continue Lanoxin 0.125 mg daily, amiodarone 200 mg  daily, ProAmatine 2.5 mg 3 times daily, milrinone as directed, Aldactone 12.5 mg daily .  Followed closely by cardiology services.  12/21- on Milrinone gtt- to go home on this- has PICC;   12/22- will see if can get set up by Friday  12/23- not til next week- will d/c 12/30 9.  SLE.  Imuran 100 mg twice daily, Plaquenil 400 mg nightly.  Follow-up Dr. Gavin Pound rheumatology services. 10.  Hyperlipidemia.  Crestor 11.  BPH.  Flomax 0.4 mg daily.  Check PVR 12.  Tachypneic to 23 on 12/20: monitor RR TID.   13. Poor appetite/Severe malnutrition  12/21- has lost >30 lbs in last 6 months- on marinol 2.5 mg BID- might increase after speaking with palliative care- has living will- is full code. Will order calorie count on pt.   12/22- will increase marinol to 5 mg BID per pt request.  14/ Constipation- likely making appetite worse  12/23- will give Sorbitol x1 this afternoon after therapy and add Senokot 2 tabs qAM    LOS: 3 days A FACE TO FACE EVALUATION WAS PERFORMED  Alexandrea Westergard 05/26/2020, 10:20 AM

## 2020-05-26 NOTE — Progress Notes (Addendum)
Advanced Heart Failure Rounding Note  PCP-Cardiologist: Mertie Moores, MD   Subjective:    Discharged to CIR 12/20  On milrinone 0.125. Co-ox stable at 52%  Making progress w/ PT. Was able to walk w/o a walker today.   Didn't eat much of his lunch but states he had a good breakfast.   Vitals stable. No cardiac symptoms.   Objective:   Weight Range: 61 kg Body mass index is 18.24 kg/m.   Vital Signs:   Temp:  [97.7 F (36.5 C)-98.4 F (36.9 C)] 98.4 F (36.9 C) (12/23 1216) Pulse Rate:  [55-62] 57 (12/23 1216) Resp:  [15-20] 20 (12/23 1216) BP: (101-112)/(62-75) 103/66 (12/23 1216) SpO2:  [98 %-100 %] 100 % (12/23 1216) Last BM Date: 05/23/20  Weight change: Filed Weights   05/24/20 0500 05/25/20 0632  Weight: 61 kg 61 kg    Intake/Output:   Intake/Output Summary (Last 24 hours) at 05/26/2020 1359 Last data filed at 05/26/2020 1240 Gross per 24 hour  Intake 130 ml  Output 1200 ml  Net -1070 ml      Physical Exam   PHYSICAL EXAM: General:  Thin/frail AAM, mildly fatigue appearing. No respiratory difficulty HEENT: normal Neck: supple. JVD ~8 cm. Carotids 2+ bilat; no bruits. No lymphadenopathy or thyromegaly appreciated. Cor: PMI nondisplaced. Regular rate & rhythm. AI murmur RUSB  Lungs: clear Abdomen: soft, nontender, nondistended. No hepatosplenomegaly. No bruits or masses. Good bowel sounds. Extremities: no cyanosis, clubbing, rash, edema + RUE PICC  Neuro: alert & oriented x 3, cranial nerves grossly intact. moves all 4 extremities w/o difficulty. Affect pleasant.    Telemetry   N/A   EKG    No new EKG to review today   Labs    CBC Recent Labs    05/24/20 0615  WBC 5.8  NEUTROABS 3.3  HGB 8.9*  HCT 30.1*  MCV 94.4  PLT 326   Basic Metabolic Panel Recent Labs    05/24/20 0615 05/25/20 0321  NA 138 138  K 4.2 4.1  CL 97* 96*  CO2 32 32  GLUCOSE 89 114*  BUN 25* 37*  CREATININE 1.12 1.17  CALCIUM 8.7* 8.3*   Liver  Function Tests Recent Labs    05/24/20 0615  AST 30  ALT 32  ALKPHOS 63  BILITOT 0.7  PROT 6.0*  ALBUMIN 2.4*   No results for input(s): LIPASE, AMYLASE in the last 72 hours. Cardiac Enzymes No results for input(s): CKTOTAL, CKMB, CKMBINDEX, TROPONINI in the last 72 hours.  BNP: BNP (last 3 results) Recent Labs    04/06/20 2134 04/26/20 1319 04/30/20 0248  BNP 1,677.6* 1,824.4* 1,685.5*    ProBNP (last 3 results) No results for input(s): PROBNP in the last 8760 hours.   D-Dimer No results for input(s): DDIMER in the last 72 hours. Hemoglobin A1C No results for input(s): HGBA1C in the last 72 hours. Fasting Lipid Panel No results for input(s): CHOL, HDL, LDLCALC, TRIG, CHOLHDL, LDLDIRECT in the last 72 hours. Thyroid Function Tests No results for input(s): TSH, T4TOTAL, T3FREE, THYROIDAB in the last 72 hours.  Invalid input(s): FREET3  Other results:   Imaging    No results found.   Medications:     Scheduled Medications: . (feeding supplement) PROSource Plus  30 mL Oral BID BM  . amiodarone  200 mg Oral Daily  . apixaban  5 mg Oral BID  . aspirin EC  81 mg Oral Daily  . azaTHIOprine  100 mg Oral BID  .  Chlorhexidine Gluconate Cloth  6 each Topical BID  . dapagliflozin propanediol  10 mg Oral Daily  . digoxin  0.125 mg Oral Daily  . docusate sodium  100 mg Oral BID  . dronabinol  5 mg Oral BID AC  . feeding supplement  237 mL Oral TID BM  . hydroxychloroquine  400 mg Oral QHS  . midodrine  2.5 mg Oral TID WC  . multivitamin with minerals  1 tablet Oral Daily  . polyvinyl alcohol  2 drop Both Eyes Daily  . rosuvastatin  20 mg Oral Daily  . senna  2 tablet Oral Daily  . sodium chloride flush  10-40 mL Intracatheter Q12H  . sorbitol  30 mL Oral Once  . spironolactone  12.5 mg Oral Daily  . tamsulosin  0.4 mg Oral Daily  . torsemide  20 mg Oral Daily    Infusions: . milrinone 0.125 mcg/kg/min (05/25/20 0806)    PRN  Medications: acetaminophen, bisacodyl, ondansetron, polyethylene glycol, sodium chloride flush, white petrolatum    Assessment/Plan   1. 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  Echo this admission looks worse than 10/21 with EF 20-25% with septal akinesis, mid to apical inferior akinesis, apical anterior and apex akinesis, moderately dilated and dysfunctional RV, moderate TR, severe MR, bicuspid aortic valve with no stenosis, mild aortic insufficiency. Progressive/end-stage CHF worsened by onset of atrial fibrillation. Cardiogenic shock 11/30 with co-ox down to 30%, Impella 5.5 placed to allow time to stabilize and consider further steps.  Impella extracted 12/13. 0.125 milrinone added post explant for marginal co-ox at 51%. Remains on milrinone 0.125. Co-ox 51% today.  He has end-stage biventricular HF. He is not a transplant candidate.  Barriers to LVAD include nutritional status/deconditioning and RV.  I think RV would be acceptable. We discussed in [place], not LVAD candidate at this point with malnutrition/deconditioning. He is now in [facility] for aggressive PT. Nutritional status improving. Will continue on milrinone as bridge to VAD to allow time to improve nutritional status and mobility. - Continue milrinone 0.125 mcg/kg/min. Co-ox 52% (stable)  - Volume status stable. Continue torsemide 20 daily.  - Off losartan with low BP.  - Cont midodrine 2.5 tid - Continue Spiro 12.5 daily  - Continue digoxin 0.125.   - Continue Farxiga 10 mg  2. CAD: Prior history of MI with LAD and ramus PCI. AdmittedMay 2021with anterior STEMI. LHC with occluded ostial LAD (in-stent), 90% ostial ramus, 60-70% proximal LCx, nondominant RCA. PTCA to LAD and ramus to restore flow, then CABG with LIMA-LAD and SVG-ramus.  No chest pain, doubt ACS.  Fall in EF from 10/21 to 11/21, but he did not present with ACS.  [place] 12/1 showed patient LIMA-LAD, patent native LAD, patent dominant LCx.  The  SVG-ramus is occluded with severe diffuse in-stent restenosis in proximal ramus.  Ramus not intervened upon as this would be a complex intervention and unlikely to markedly improve EF.  - No s/s angina - Continue ASA 81 and statin.    3. AKI: Creatinine now trending down with improved cardiac output. Renal function normalized.  Repeat BMP in am.  4. Right pleural effusion: S/p thoracentesis, transudative (CHF).  5. Atrial fibrillation/flutter with RVR: DCCV on 12/3, went back to NSR 12/7. RRR on exam today  - Continue amiodarone 200 mg once daily  - Continue apixaban 5 mg bid  6. Mitral regurgitation:  TEE 12/3 with moderate central MR.  Doubt MitraClip will help him much with moderate MR and  he is likely too advanced.  7. H/o PE: In 10/21.  Keep anticoagulated, on apixaban. No bleeding  8. Bicuspid aortic valve: Mild AI, no AS.  9. SLE: H/o pericarditis.  On Imuran and hydroxychloroquine.  - Restarted home SLE regimen now that he is taking po.   10. Pulmonary nodules/emphysema: CCM following, s/p bronch/BAL (no growth).  11. ID: Concern for right-sided PNA, ?aspiration.  Cultures NGTD.  Now afebrile and has completed course of vancomycin/cefepime.   - no change 12. Thrombocytopenia: Resolved.  May have been due to critical illness, low grade hemolysis with Impella. LDH ok.  HIT negative.  - Back on apixaban.  13. Hypokalemia/hypomag: Supplement as needed. 14. Acute blood loss anemia: No obvious site.   - Hgb stabilized ~9  - Transfuse hgb < 8.  15. VT: Episode early am 12/7, required DCCV.  No recurrence.  16. Deconditioning: -  Making progress in CIR. Continue therapy plan. Greatly appreciate CIR assistance    Length of Stay: 3  Brittainy Simmons, PA-C  05/26/2020, 1:59 PM  Advanced Heart Failure Team Pager [phone] (M-F; 7a - 4p)  Please contact [facility] for night-coverage after hours (4p -7a ) and weekends on [email or web address]  Patient seen and examined with the above-signed  Advanced Practice Provider and/or Housestaff. I personally reviewed laboratory data, imaging studies and relevant notes. I independently examined the patient and formulated the important aspects of the plan. I have edited the note to reflect any of my changes or salient points. I have personally discussed the plan with the patient and/or family.  Remains on milrinone. Co-ox marginal (52%) but stable. Volume status ok.   General:  Thin. Frail No resp difficulty HEENT: normal Neck: supple. no JVD. Carotids 2+ bilat; no bruits. No lymphadenopathy or thryomegaly appreciated. Cor: PMI nondisplaced. Regular rate & rhythm. +s3 Lungs: clear Abdomen: soft, nontender, nondistended. No hepatosplenomegaly. No bruits or masses. Good bowel sounds. Extremities: no cyanosis, clubbing, rash, edema Neuro: alert & orientedx3, cranial nerves grossly intact. moves all 4 extremities w/o difficulty. Affect pleasant   Remains on milrinone. Tenuous but stable. Improving with CIR. Likely home Monday with milrinone.   Glori Bickers, MD  3:45 PM

## 2020-05-26 NOTE — Progress Notes (Signed)
Physical Therapy Session Note  Patient Details  Name: Cody Arellano MRN: [id number] Date of Birth: 11/15/1945  Today's Date: 05/26/2020 PT Individual Time: 0900-1000 PT Individual Time Calculation (min): 60 min   Short Term Goals: Week 1:  PT Short Term Goal 1 (Week 1): pt to demonstrate supine<>sit mod I PT Short Term Goal 2 (Week 1): pt to demonstrate functional transfers CGA PT Short Term Goal 3 (Week 1): pt to demonstrate ambulation with LRAD at community distances at [facility] PT Short Term Goal 4 (Week 1): pt to demonstrate dynamic standing balance at [facility]   Skilled Therapeutic Interventions/Progress Updates:     Patient in bed upon PT arrival. Patient alert and agreeable to PT session. Patient denied pain during session, reported poor sleep due to missing the holiday with his family.   Therapeutic Activity: Bed Mobility: Patient performed supine to/from sit with supervision-mod I in a flat bed without use of bed rails.  Transfers: Patient performed sit to/from stand x5 with supervision with and without RW with use of B upper extremities to push up and control descent. Provided verbal cues for scooting forward and forward weight shift to stand.  Gait Training:  Patient ambulated 347 feet x2 using RW with supervision-CGA on first trial and no AD with CGA and intermittent min A on second trial. Ambulated with decreased gait speed, narrow BOS, mild forward trunk flexion, and decreased trunk rotation/arm swing. Provided verbal cues for pace breathing throughout, erect posture, increased BOS and increased arms swing for improved balance. Patient requires 2 short, 10-20 sec standing rest breaks without AD and min A x3 due to minor LOB with decreased righting reactions. HR 60-63 bpm, SPO2 >96%, RPE 5-7/10 after gait trials.  Neuromuscular Re-ed: Patient performed the following standing balance activities: Attempted to initiate Berg Balance Test, limited by fatigue and nausea during testing. -sit  to stand, requires use of hands without assist -stand to sit, requires use of hands without assist -transfer, requires use of hands without assist -standing 2 min, supervision -standing eyes closed 10 sec, supervision -standing feet together 1 min supervision increased ankle strategies  Patient reported nausea during testing, RN made aware. Provided ginger ale and crackers per patient's request. Reports this helps at home. BP 126/74, HR 60  Patient in bed with RN in the room at end of session with breaks locked, bed alarm set, and all needs within reach.    Therapy Documentation Precautions:  Precautions Precautions: Fall Precaution Comments: skin integrity- pressure sore on buttocks, restricted RUE Restrictions Weight Bearing Restrictions: No RUE Weight Bearing: Partial weight bearing Other Position/Activity Restrictions: R impella   Therapy/Group: Individual Therapy  Kikue Gerhart L Tanessa Tidd PT, DPT  05/26/2020, 6:16 PM

## 2020-05-27 ENCOUNTER — Inpatient Hospital Stay (HOSPITAL_COMMUNITY): Payer: No Typology Code available for payment source

## 2020-05-27 ENCOUNTER — Inpatient Hospital Stay (HOSPITAL_COMMUNITY): Payer: Medicare Other

## 2020-05-27 DIAGNOSIS — I351 Nonrheumatic aortic (valve) insufficiency: Secondary | ICD-10-CM

## 2020-05-27 LAB — ECHOCARDIOGRAM LIMITED
AR max vel: 2.29 cm2
AV Area VTI: 2.49 cm2
AV Area mean vel: 1.89 cm2
AV Mean grad: 9 mmHg
AV Peak grad: 16.6 mmHg
Ao pk vel: 2.04 m/s
Height: 72 in
P 1/2 time: 573 msec
S' Lateral: 4.7 cm
Weight: 2151.69 oz

## 2020-05-27 LAB — BASIC METABOLIC PANEL
Anion gap: 11 (ref 5–15)
BUN: 39 mg/dL — ABNORMAL HIGH (ref 8–23)
CO2: 32 mmol/L (ref 22–32)
Calcium: 8.6 mg/dL — ABNORMAL LOW (ref 8.9–10.3)
Chloride: 96 mmol/L — ABNORMAL LOW (ref 98–111)
Creatinine, Ser: 1.23 mg/dL (ref 0.61–1.24)
GFR, Estimated: >60 mL/min (ref >60)
Glucose, Bld: 107 mg/dL — ABNORMAL HIGH (ref 70–99)
Potassium: 4.2 mmol/L (ref 3.5–5.1)
Sodium: 139 mmol/L (ref 135–145)

## 2020-05-27 LAB — COOXEMETRY PANEL
Carboxyhemoglobin: 1.7 % — ABNORMAL HIGH (ref 0.5–1.5)
Methemoglobin: 0.9 % (ref 0.0–1.5)
O2 Saturation: 49.3 %
Total hemoglobin: 9.4 g/dL — ABNORMAL LOW (ref 12.0–16.0)

## 2020-05-27 MED ORDER — SPIRONOLACTONE 25 MG PO TABS
25.0000 mg | ORAL_TABLET | Freq: Every day | ORAL | Status: DC
  Administered 2020-05-28 – 2020-05-31 (×4): 25 mg via ORAL
  Filled 2020-05-27 (×4): qty 1

## 2020-05-27 MED ORDER — FLEET ENEMA 7-19 GM/118ML RE ENEM
1.0000 | ENEMA | Freq: Once | RECTAL | Status: AC
  Administered 2020-05-27: 03:00:00 1 via RECTAL
  Filled 2020-05-27: qty 1

## 2020-05-27 NOTE — Progress Notes (Signed)
Occupational Therapy Session Note  Patient Details  Name: HILLERY BHALLA MRN: [id number] Date of Birth: 1945/06/12  Today's Date: 05/27/2020 OT Individual Time:  -  MISSED MINUTES: 21  Pt politely declined participation in skilled OT this morning 2/2 fatigue, nausea, and episode of bowel incontinence this morning. States he had "an awful night and needs to rest." Will follow up for second session this pm.      Volanda Napoleon, MS, OTR/L 05/27/2020, 8:12 AM

## 2020-05-27 NOTE — Progress Notes (Signed)
[place] PHYSICAL MEDICINE & REHABILITATION PROGRESS NOTE   Subjective/Complaints:  Pt reports bowel accident last night- bowels cramping and Bowels hurting and moving.   Tried laxatives, suppository and enema last night to have BM- finally broke loose.   Tylenol took for pain.    ROS:  Pt denies SOB, abd pain, CP, N/V/C/D, and vision changes  Objective:   No results found. No results for input(s): WBC, HGB, HCT, PLT in the last 72 hours. Recent Labs    05/25/20 0321 05/27/20 0413  NA 138 139  K 4.1 4.2  CL 96* 96*  CO2 32 32  GLUCOSE 114* 107*  BUN 37* 39*  CREATININE 1.17 1.23  CALCIUM 8.3* 8.6*    Intake/Output Summary (Last 24 hours) at 05/27/2020 [id number] Last data filed at 05/27/2020 0809 Gross per 24 hour  Intake 697 ml  Output 785 ml  Net -88 ml     Pressure Injury 05/23/20 Coccyx Lower;Mid Stage 2 -  Partial thickness loss of dermis presenting as a shallow open injury with a red, pink wound bed without slough. has a blister appearance. previously charted as skin tear. (Active)  05/23/20 1630  Location: Coccyx  Location Orientation: Lower;Mid  Staging: Stage 2 -  Partial thickness loss of dermis presenting as a shallow open injury with a red, pink wound bed without slough.  Wound Description (Comments): has a blister appearance. previously charted as skin tear.  Present on Admission: Yes    Physical Exam: Vital Signs Blood pressure 103/66, pulse 60, temperature 98.2 F (36.8 C), resp. rate 16, height 6' (1.829 m), weight 61 kg, SpO2 97 %.   Physical Exam General: pt awake, alert, appropriate, gaunt, NAD; on milrinone drip HEENT: conjugate gaze Heart: borderline bradycardic, irregular rhythm Chest: CTA B/L- no W/R/R- good air movement Abdomen: much softer, NT, ND, hyperactive BS  Extremities: No clubbing, cyanosis, or edema. Pulses are 2+ Skin: Clean and intact without signs of breakdown Neuro: Pt is cognitively appropriate with normal insight,  memory, and awareness. Cranial nerves 2-12 are intact. Patient is alert sitting up in bed.  Oriented x3 and follows commands. No tremors. Motor function is grossly 4/5.  Musculoskeletal: Full ROM, No pain with AROM or PROM in the neck, trunk, or extremities. Posture appropriate Psych: flat affect    Assessment/Plan: 1. Functional deficits which require 3+ hours per day of interdisciplinary therapy in a comprehensive inpatient rehab setting.  Physiatrist is providing close team supervision and 24 hour management of active medical problems listed below.  Physiatrist and rehab team continue to assess barriers to discharge/monitor patient progress toward functional and medical goals  Care Tool:  Bathing    Body parts bathed by patient: Front perineal area,Buttocks,Right arm,Left arm,Chest,Abdomen,Right upper leg,Left upper leg,Face     Body parts n/a: Right arm,Left arm,Chest,Abdomen,Left lower leg,Face,Right lower leg,Left upper leg,Right upper leg (pt declined)   Bathing assist Assist Level: Contact Guard/Touching assist     Upper Body Dressing/Undressing Upper body dressing   What is the patient wearing?: Hospital gown only    Upper body assist Assist Level: Supervision/Verbal cueing    Lower Body Dressing/Undressing Lower body dressing      What is the patient wearing?: Pants,Underwear/pull up     Lower body assist Assist for lower body dressing: Supervision/Verbal cueing     Toileting Toileting    Toileting assist Assist for toileting: Minimal Assistance - Patient > 75% Assistive Device Comment: urinal   Transfers Chair/bed transfer  Transfers assist  Chair/bed transfer assist level: Supervision/Verbal cueing     Locomotion Ambulation   Ambulation assist      Assist level: Contact Guard/Touching assist Assistive device: No Device Max distance: 347 ft   Walk 10 feet activity   Assist     Assist level: Contact Guard/Touching assist Assistive  device: No Device   Walk 50 feet activity   Assist    Assist level: Contact Guard/Touching assist Assistive device: No Device    Walk 150 feet activity   Assist    Assist level: Contact Guard/Touching assist Assistive device: No Device    Walk 10 feet on uneven surface  activity   Assist Walk 10 feet on uneven surfaces activity did not occur: Safety/medical concerns         Wheelchair     Assist Will patient use wheelchair at discharge?: No Type of Wheelchair: Manual      Max wheelchair distance: 150    Wheelchair 50 feet with 2 turns activity    Assist        Assist Level: Minimal Assistance - Patient > 75%   Wheelchair 150 feet activity     Assist      Assist Level: Minimal Assistance - Patient > 75%   Blood pressure 103/66, pulse 60, temperature 98.2 F (36.8 C), resp. rate 16, height 6' (1.829 m), weight 61 kg, SpO2 97 %.  Medical Problem List and Plan: 1.  Debility secondary to acute on chronic systolic congestive heart failure/cardiogenic shock/ischemic cardiomyopathy status post Impella insertion 05/03/2020 with removal of Impella assistive device 05/16/2020  12/21- has PICC RUE for Milrinone at home  12/22- will speak to Cardiology to see if can d/c Friday or not and get Milrinone set up for d/c.  12/23- not able to arrange til next week- d/c date 12/30   12/24- went over d/c date again with pt.              -patient may shower             -ELOS/Goals: modI 5-7 days 2.  Antithrombotics: -DVT/anticoagulation: Eliquis             -antiplatelet therapy: N/A 3. Pain Management: Oxycodone as needed- has not used in 5 days, may d/c. Main source of pain is sacral pressure injury- offload at least q2H to prevent pain and worsening of injury.   12/24- took tylenol this AM- let him know he has oxy as a choice prn 4. Mood: Provide emotional support             -antipsychotic agents: N/A 5. Neuropsych: This patient is capable of making  decisions on his own behalf. 6. Skin/Wound Care: Routine skin checks 12/21- keep off sacral decub- needs to eat MORE protein/calories to heal sacral decub-  7. Fluids/Electrolytes/Nutrition: Routine in and outs with follow-up chemistries. Electrolytes stable on 12/20: monitor weekly. 8.  Atrial fibrillation/ischemic cardiomyopathy/chronic systolic congestive heart failure/history of PE.  Status post DCCV 05/10/2020.  Continue Lanoxin 0.125 mg daily, amiodarone 200 mg  daily, ProAmatine 2.5 mg 3 times daily, milrinone as directed, Aldactone 12.5 mg daily .  Followed closely by cardiology services.  12/21- on Milrinone gtt- to go home on this- has PICC;   12/22- will see if can get set up by Friday  12/23- not til next week- will d/c 12/30 9.  SLE.  Imuran 100 mg twice daily, Plaquenil 400 mg nightly.  Follow-up Dr. Gavin Pound rheumatology services. 10.  Hyperlipidemia.  Crestor 11.  BPH.  Flomax 0.4 mg daily.  Check PVR 12. Tachypneic to 23 on 12/20: monitor RR TID.   13. Poor appetite/Severe malnutrition  12/21- has lost >30 lbs in last 6 months- on marinol 2.5 mg BID- might increase after speaking with palliative care- has living will- is full code. Will order calorie count on pt.   12/22- will increase marinol to 5 mg BID per pt request.   12/24- pt says eating a little more-  14/ Constipation- likely making appetite worse  12/23- will give Sorbitol x1 this afternoon after therapy and add Senokot 2 tabs qAM  12/24- took suppository and enema last night- finally having BM, but stomach still hurting a lot- con't regimen - of note, had bowel accident.     LOS: 4 days A FACE TO FACE EVALUATION WAS PERFORMED  Cody Arellano 05/27/2020, 9:21 AM

## 2020-05-27 NOTE — Progress Notes (Addendum)
Advanced Heart Failure Rounding Note  PCP-Cardiologist: Mertie Moores, MD   Subjective:    Discharged to CIR 12/20  On milrinone 0.125. Co-ox lower today at 49% (baseline on milrinone ~51-52%)   SCr ok 1.23 K 4.2   Neck veins elevated. Last charted wt was from 12/22 (will ask RN to weigh). Denies dyspnea.   Did not eat his breakfast.   Objective:   Weight Range: 61 kg Body mass index is 18.24 kg/m.   Vital Signs:   Temp:  [97.8 F (36.6 C)-98.4 F (36.9 C)] 98.2 F (36.8 C) (12/24 0413) Pulse Rate:  [56-62] 60 (12/24 0809) Resp:  [15-20] 16 (12/24 0413) BP: (103-112)/(62-66) 103/66 (12/24 0413) SpO2:  [96 %-100 %] 97 % (12/24 0413) Last BM Date: 05/25/20  Weight change: Filed Weights   05/24/20 0500 05/25/20 0632  Weight: 61 kg 61 kg    Intake/Output:   Intake/Output Summary (Last 24 hours) at 05/27/2020 0818 Last data filed at 05/27/2020 0809 Gross per 24 hour  Intake 377 ml  Output 785 ml  Net -408 ml      Physical Exam   PHYSICAL EXAM: General: frail AAM  No respiratory difficulty HEENT: normal Neck: supple. JVD ~10 cm. Carotids 2+ bilat; no bruits. No lymphadenopathy or thyromegaly appreciated. Cor: PMI nondisplaced. Regular rate & rhythm. No rubs or gallops + AI murmur Lungs: clear Abdomen: soft, nontender, nondistended. No hepatosplenomegaly. No bruits or masses. Good bowel sounds. Extremities: thin extremities, no cyanosis, clubbing, rash, edema + RUE PICC  Neuro: alert & oriented x 3, cranial nerves grossly intact. moves all 4 extremities w/o difficulty. Affect pleasant.   Telemetry   N/A   EKG    No new EKG to review today   Labs    CBC No results for input(s): WBC, NEUTROABS, HGB, HCT, MCV, PLT in the last 72 hours. Basic Metabolic Panel Recent Labs    05/25/20 0321 05/27/20 0413  NA 138 139  K 4.1 4.2  CL 96* 96*  CO2 32 32  GLUCOSE 114* 107*  BUN 37* 39*  CREATININE 1.17 1.23  CALCIUM 8.3* 8.6*   Liver  Function Tests No results for input(s): AST, ALT, ALKPHOS, BILITOT, PROT, ALBUMIN in the last 72 hours. No results for input(s): LIPASE, AMYLASE in the last 72 hours. Cardiac Enzymes No results for input(s): CKTOTAL, CKMB, CKMBINDEX, TROPONINI in the last 72 hours.  BNP: BNP (last 3 results) Recent Labs    04/06/20 2134 04/26/20 1319 04/30/20 0248  BNP 1,677.6* 1,824.4* 1,685.5*    ProBNP (last 3 results) No results for input(s): PROBNP in the last 8760 hours.   D-Dimer No results for input(s): DDIMER in the last 72 hours. Hemoglobin A1C No results for input(s): HGBA1C in the last 72 hours. Fasting Lipid Panel No results for input(s): CHOL, HDL, LDLCALC, TRIG, CHOLHDL, LDLDIRECT in the last 72 hours. Thyroid Function Tests No results for input(s): TSH, T4TOTAL, T3FREE, THYROIDAB in the last 72 hours.  Invalid input(s): FREET3  Other results:   Imaging    No results found.   Medications:     Scheduled Medications: . (feeding supplement) PROSource Plus  30 mL Oral BID BM  . amiodarone  200 mg Oral Daily  . apixaban  5 mg Oral BID  . aspirin EC  81 mg Oral Daily  . azaTHIOprine  100 mg Oral BID  . Chlorhexidine Gluconate Cloth  6 each Topical BID  . dapagliflozin propanediol  10 mg Oral Daily  .  digoxin  0.125 mg Oral Daily  . docusate sodium  100 mg Oral BID  . dronabinol  5 mg Oral BID AC  . feeding supplement  237 mL Oral TID BM  . hydroxychloroquine  400 mg Oral QHS  . midodrine  2.5 mg Oral TID WC  . multivitamin with minerals  1 tablet Oral Daily  . polyvinyl alcohol  2 drop Both Eyes Daily  . rosuvastatin  20 mg Oral Daily  . senna  2 tablet Oral Daily  . sodium chloride flush  10-40 mL Intracatheter Q12H  . spironolactone  12.5 mg Oral Daily  . tamsulosin  0.4 mg Oral Daily  . torsemide  20 mg Oral Daily    Infusions: . milrinone 0.125 mcg/kg/min (05/26/20 1815)    PRN Medications: acetaminophen, bisacodyl, ondansetron, polyethylene glycol,  sodium chloride flush, white petrolatum    Assessment/Plan   1. 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  Echo this admission looks worse than 10/21 with EF 20-25% with septal akinesis, mid to apical inferior akinesis, apical anterior and apex akinesis, moderately dilated and dysfunctional RV, moderate TR, severe MR, bicuspid aortic valve with no stenosis, mild aortic insufficiency. Progressive/end-stage CHF worsened by onset of atrial fibrillation. Cardiogenic shock 11/30 with co-ox down to 30%, Impella 5.5 placed to allow time to stabilize and consider further steps.  Impella extracted 12/13. 0.125 milrinone added post explant for marginal co-ox at 51%. Remains on milrinone 0.125. Co-ox 49 % today.  He has end-stage biventricular HF. He is not a transplant candidate.  Barriers to LVAD include nutritional status/deconditioning and RV.  I think RV would be acceptable. We discussed in [place], not LVAD candidate at this point with malnutrition/deconditioning. He is now in [place] for aggressive PT. Nutritional status improving. Will continue on milrinone as bridge to VAD to allow time to improve nutritional status and mobility. - On milrinone 0.125 mcg/kg/min. Co-ox lower at 49% today. Has been in 51-52% range. ? Increase to 0.25. defer to MD  - Volume status mildly elevated. Will ask RN to weigh today.  Continue torsemide 20 daily.  - Off losartan with low BP.  - Cont midodrine 2.5 tid - Increase Spiro to 25 mg daily (repeat BMP in am)  - Continue digoxin 0.125.   - Continue Farxiga 10 mg  2. CAD: Prior history of MI with LAD and ramus PCI. AdmittedMay 2021with anterior STEMI. LHC with occluded ostial LAD (in-stent), 90% ostial ramus, 60-70% proximal LCx, nondominant RCA. PTCA to LAD and ramus to restore flow, then CABG with LIMA-LAD and SVG-ramus.  No chest pain, doubt ACS.  Fall in EF from 10/21 to 11/21, but he did not present with ACS.  [place] 12/1 showed patient  LIMA-LAD, patent native LAD, patent dominant LCx.  The SVG-ramus is occluded with severe diffuse in-stent restenosis in proximal ramus.  Ramus not intervened upon as this would be a complex intervention and unlikely to markedly improve EF.  - No s/s angina - Continue ASA 81 and statin.    3. AKI: Creatinine now trending down with improved cardiac output. Renal function normalized.  Repeat BMP in am.  4. Right pleural effusion: S/p thoracentesis, transudative (CHF).  5. Atrial fibrillation/flutter with RVR: DCCV on 12/3, went back to NSR 12/7. RRR on exam today  - Continue amiodarone 200 mg once daily  - Continue apixaban 5 mg bid  6. Mitral regurgitation:  TEE 12/3 with moderate central MR.  Doubt MitraClip will help him much with moderate  MR and he is likely too advanced.  7. H/o PE: In 10/21.  Keep anticoagulated, on apixaban. No bleeding  8. Bicuspid aortic valve: Mild AI, no AS.  9. SLE: H/o pericarditis.  On Imuran and hydroxychloroquine.  - Restarted home SLE regimen now that he is taking po.   10. Pulmonary nodules/emphysema: CCM following, s/p bronch/BAL (no growth).  11. ID: Concern for right-sided PNA, ?aspiration.  Cultures NGTD.  Now afebrile and has completed course of vancomycin/cefepime.   - no change 12. Thrombocytopenia: Resolved.  May have been due to critical illness, low grade hemolysis with Impella. LDH ok.  HIT negative.  - Back on apixaban.  13. Hypokalemia/hypomag: Supplement as needed. 14. Acute blood loss anemia: No obvious site.   - Hgb stabilized ~9  - Transfuse hgb < 8.  15. VT: Episode early am 12/7, required DCCV.  No recurrence.  16. Deconditioning: -  Making progress in CIR. Continue therapy plan. Greatly appreciate CIR assistance    Length of Stay: [address] Rosita Fire, PA-C  05/27/2020, 8:18 AM  Advanced Heart Failure Team Pager [phone] (M-F; 7a - 4p)  Please contact [facility] for night-coverage after hours (4p -7a ) and weekends on  [email or web address]  Patient seen and examined with the above-signed Advanced Practice Provider and/or Housestaff. I personally reviewed laboratory data, imaging studies and relevant notes. I independently examined the patient and formulated the important aspects of the plan. I have edited the note to reflect any of my changes or salient points. I have personally discussed the plan with the patient and/or family.  Feels weaker today. Was up all night with constipation. Now feels better after BM.   On milrinone 0.125. Co-ox down 51% -> 49%. Denies CP or SOB.   General: elderly.frail and weak appearing. No resp difficulty HEENT: normal Neck: supple. JVP 9 Carotids 2+ bilat; no bruits. No lymphadenopathy or thryomegaly appreciated. Cor: PMI nondisplaced. Regular rate & rhythm. 2/6 SEM at RUSB 2/6 AI  Lungs: clear Abdomen: soft, nontender, nondistended. No hepatosplenomegaly. No bruits or masses. Good bowel sounds. Extremities: no cyanosis, clubbing, rash, edema Neuro: alert & orientedx3, cranial nerves grossly intact. moves all 4 extremities w/o difficulty. Affect pleasant  He is tenuous but relatively stable on low dose milrinone. Co-ox 49%. Neck veins up slightly. Continue torsemide 20 daily. Has some AI on exam. Will get repeat echo to assess for any damage from Impella. (had mild AI on original echo). Will recheck co-ox in am if still < 50% will increase milrinone to 0.25. I continue to worry about his prognosis. Check prealbumin in am.   Glori Bickers, MD  10:10 AM

## 2020-05-27 NOTE — Progress Notes (Signed)
Patient c/o constipation. Received order from [place], [place] for [employer]. Patient had sat on BSC x3 for several minutes each time without results and had a small amount of clear orange emesis. Patient had been drinking Orange Crush soda. Patient unable to eat PM meal and feeling miserable. No results from suppository and only small results from enema. Patient still feeling full and needing to go but wanting to quit trying for now and get some sleep.

## 2020-05-27 NOTE — Progress Notes (Signed)
Physical Therapy Session Note  Patient Details  Name: Cody Arellano MRN: [id number] Date of Birth: 12-29-1945  Today's Date: 05/27/2020 PT Individual Time: 9373-4287 PT Individual Time Calculation (min): 45 min   Short Term Goals: Week 1:  PT Short Term Goal 1 (Week 1): pt to demonstrate supine<>sit mod I PT Short Term Goal 2 (Week 1): pt to demonstrate functional transfers CGA PT Short Term Goal 3 (Week 1): pt to demonstrate ambulation with LRAD at community distances at [facility] PT Short Term Goal 4 (Week 1): pt to demonstrate dynamic standing balance at [facility]  Skilled Therapeutic Interventions/Progress Updates:    Patient in supine, RN in the room reports pt on bedpan and needing to be cleaned up.  Missed 15 minutes of skilled PT due to toileting and nursing care.  Patient rolling in bed for hygiene with S using rail.  Supine to sit with S.  C/o nausea due to constipation and noted pt did not eat breakfast.  Sit to stand to w/c no AD with CGA.  Patient pushed in w/c to ortho gym.  Standing balance with no UE support reaching to tap letters at [facility] balance system with close S.  Noted pt leaning back stabilizing on ligaments in standing.  Seated for core strengthening using 6# weight for activating core moving hip to opposite shoulder x 10 each way, then marching, leaning back for modified abdominal strengthening.  Sit to supine with S on mat.  Bridging x 10, SLR x 10 each leg, lateral trunk rotation x 10 and sidelying clamshell hip abduction 2 x 10 each leg.  Sit to stand pt impulsive due to need to urinate while PT obtaining urinal.  Patient standing with intermittent 1 UE support to use urinal with CGA.  Patient ambulated to room x 200' with RW and CGA.  Patient requesting BSC.  Tech in room to assist and helped pt onto [facility] with soiled brief removed.  Left on BSC with NT in the room.   Therapy Documentation Precautions:  Precautions Precautions: Fall Precaution Comments: skin integrity- pressure  sore on buttocks, restricted RUE Restrictions Weight Bearing Restrictions: Yes RUE Weight Bearing: Partial weight bearing Other Position/Activity Restrictions: R impella General: PT Amount of Missed Time (min): 15 Minutes PT Missed Treatment Reason: Toileting;Nursing care Pain: Pain Assessment Pain Scale: 0-10 Pain Score: 0-No pain    Therapy/Group: Individual Therapy  Reginia Naas  Magda Kiel, PT 05/27/2020, 12:34 PM

## 2020-05-27 NOTE — Progress Notes (Signed)
Occupational Therapy Session Note  Patient Details  Name: Cody Arellano MRN: [id number] Date of Birth: Feb 18, 1946  Today's Date: 05/27/2020 OT Individual Time: 1348-1500 OT Individual Time Calculation (min): 72 min    Short Term Goals: Week 1:  OT Short Term Goal 1 (Week 1): STGs=LTGs due to ELOS  Skilled Therapeutic Interventions/Progress Updates:    Pt received sup in bed, asleep, easily awakened to voice. Pt agreeable to therapy, req to use urinal. Voided with urinal + performed anterior peri care with close sup. Side roll > R with distant sup, side lying > sit EOB with distant sup. Sitting EOB, ate a little bit of his lunch with min A to open food containers 2/2 to pt fatigue. Donned pants with close sup during sit <> stand. Donned B shoes with distant sup. Sit > stand and amb to Day Room with close sup + RW. Pt reported moderate fatigue. Pt able to remain standing for ~20 min to play Wii golf with close sup for improved activity tolerance and dynamic standing balance. Pt completed stand > sit with min A for balance + controlled descent as R knee buckled, pt reporting that he is at a "5/10" fatigue level.  Amb back to room with close sup + RW. Stand > sit EOB with RW with close sup, ate some more of his lunch with min A to open food containers 2/2 pt fatigue. Sit > sup with distant sup. Pt with overall poor affect 2/2 late night with episode of emesis + incontinent bowels + poor appetite. Pt left semi-reclined in bed, with call bell in reach, bed alarm engaged, and all immediate needs met.   Therapy Documentation Precautions:  Precautions Precautions: Fall Precaution Comments: skin integrity- pressure sore on buttocks, restricted RUE Restrictions Weight Bearing Restrictions: No RUE Weight Bearing: Weight bearing as tolerated Other Position/Activity Restrictions: R impella  Pain: Pain Assessment Pain Scale: 0-10 Pain Score: 0-No pain ADL: See Care Tool for more  details.   Therapy/Group: Individual Therapy  Volanda Napoleon, MS, OTR/L 05/27/2020, 3:49 PM

## 2020-05-27 NOTE — Progress Notes (Signed)
  Echocardiogram 2D Echocardiogram has been performed.  Cody Arellano 05/27/2020, 11:44 AM

## 2020-05-28 LAB — COOXEMETRY PANEL
Carboxyhemoglobin: 2 % — ABNORMAL HIGH (ref 0.5–1.5)
Methemoglobin: 0.8 % (ref 0.0–1.5)
O2 Saturation: 66 %
Total hemoglobin: 9.4 g/dL — ABNORMAL LOW (ref 12.0–16.0)

## 2020-05-28 LAB — PREALBUMIN: Prealbumin: 30.1 mg/dL (ref 18–38)

## 2020-05-28 MED ORDER — SENNOSIDES-DOCUSATE SODIUM 8.6-50 MG PO TABS
2.0000 | ORAL_TABLET | Freq: Two times a day (BID) | ORAL | Status: DC
  Administered 2020-05-28 – 2020-05-31 (×7): 2 via ORAL
  Filled 2020-05-28 (×7): qty 2

## 2020-05-28 NOTE — Progress Notes (Signed)
[facility] PHYSICAL MEDICINE & REHABILITATION PROGRESS NOTE   Subjective/Complaints:  Appreciate cardiology note , pt feels constipated but  last BM 12/24   ROS:  Pt denies SOB, abd pain, CP, N/V/D  Objective:   ECHOCARDIOGRAM LIMITED  Result Date: 05/27/2020    ECHOCARDIOGRAM LIMITED REPORT   [name] [place]line Date of Exam: 05/27/2020 Medical Rec #:  [id number]    Height:       72.0 in Accession #:    [id number]   Weight:       134.5 lb Date of Birth:  September 05, 1945    BSA:          1.800 m Patient Age:    74 years     BP:           119/69 mmHg Patient Gender: M            HR:           58 bpm. Exam Location:  Inpatient Procedure: Limited Echo, Cardiac Doppler and Color Doppler Indications:     Aortic regurgitation  History:         Patient has prior history of Echocardiogram examinations, most                  recent 05/16/2020. CHF and Cardiomyopathy, Aortic Valve                  Disease; Arrythmias:Atrial Fibrillation. Post Impella removal.  Sonographer:     Lavenia Atlas Referring Phys:  [id number] DANIEL R BENSIMHON Diagnosing Phys: Dina Rich MD IMPRESSIONS  1. Limited echo. The mid to distal anteroseptal,anterior walls are akinetic The apex is akinetic. The mid to distal anterolateral wall is hypokinetic. Left ventricular ejection fraction, by estimation, is 20 to 25%. The left ventricle has severely decreased function. The left ventricle demonstrates regional wall motion abnormalities (see scoring diagram/findings for description).  2. Right ventricular systolic function is normal. The right ventricular size is mildly enlarged.  3. Moderate mitral valve regurgitation. No evidence of mitral stenosis.  4. The aortic valve is tricuspid. There is mild calcification of the aortic valve. There is mild thickening of the aortic valve. Aortic valve regurgitation is mild to moderate. No aortic stenosis is present. FINDINGS  Left Ventricle: Limited echo. The mid to distal  anteroseptal,anterior walls are akinetic The apex is akinetic. The mid to distal anterolateral wall is hypokinetic. Left ventricular ejection fraction, by estimation, is 20 to 25%. The left ventricle has severely decreased function. The left ventricle demonstrates regional wall motion abnormalities. Right Ventricle: The right ventricular size is mildly enlarged. No increase in right ventricular wall thickness. Right ventricular systolic function is normal. Mitral Valve: There is mild thickening of the mitral valve leaflet(s). There is mild calcification of the mitral valve leaflet(s). Mild mitral annular calcification. Moderate mitral valve regurgitation. No evidence of mitral valve stenosis. Tricuspid Valve: The tricuspid valve is not assessed. Aortic Valve: The aortic valve is tricuspid. There is mild calcification of the aortic valve. There is mild thickening of the aortic valve. There is mild aortic valve annular calcification. Aortic valve regurgitation is mild to moderate. Aortic regurgitation PHT measures 573 msec. No aortic stenosis is present. Aortic valve mean gradient measures 9.0 mmHg. Aortic valve peak gradient measures 16.6 mmHg. Aortic valve area, by VTI measures 2.49 cm. Pulmonic Valve: The pulmonic valve was not well visualized. Pulmonic valve regurgitation is mild. No evidence of pulmonic stenosis. LEFT VENTRICLE PLAX 2D LVIDd:  5.70 cm LVIDs:         4.70 cm LV PW:         1.10 cm LV IVS:        0.90 cm LVOT diam:     2.40 cm LV SV:         90 LV SV Index:   50 LVOT Area:     4.52 cm  AORTIC VALVE AV Area (Vmax):    2.29 cm AV Area (Vmean):   1.89 cm AV Area (VTI):     2.49 cm AV Vmax:           203.50 cm/s AV Vmean:          144.000 cm/s AV VTI:            0.363 m AV Peak Grad:      16.6 mmHg AV Mean Grad:      9.0 mmHg LVOT Vmax:         103.00 cm/s LVOT Vmean:        60.200 cm/s LVOT VTI:          0.200 m LVOT/AV VTI ratio: 0.55 AI PHT:            573 msec  SHUNTS Systemic VTI:   0.20 m Systemic Diam: 2.40 cm Carlyle Dolly MD Electronically signed by Carlyle Dolly MD Signature Date/Time: 05/27/2020/12:16:13 PM    Final (Updated)    No results for input(s): WBC, HGB, HCT, PLT in the last 72 hours. Recent Labs    05/27/20 0413  NA 139  K 4.2  CL 96*  CO2 32  GLUCOSE 107*  BUN 39*  CREATININE 1.23  CALCIUM 8.6*    Intake/Output Summary (Last 24 hours) at 05/28/2020 0830 Last data filed at 05/28/2020 0725 Gross per 24 hour  Intake 740 ml  Output 850 ml  Net -110 ml     Pressure Injury 05/23/20 Coccyx Lower;Mid Stage 2 -  Partial thickness loss of dermis presenting as a shallow open injury with a red, pink wound bed without slough. has a blister appearance. previously charted as skin tear. (Active)  05/23/20 1630  Location: Coccyx  Location Orientation: Lower;Mid  Staging: Stage 2 -  Partial thickness loss of dermis presenting as a shallow open injury with a red, pink wound bed without slough.  Wound Description (Comments): has a blister appearance. previously charted as skin tear.  Present on Admission: Yes    Physical Exam: Vital Signs Blood pressure 111/68, pulse (!) 55, temperature 98.1 F (36.7 C), temperature source Oral, resp. rate 19, height 6' (1.829 m), weight 61 kg, SpO2 97 %.   Physical Exam  General: No acute distress Mood and affect are appropriate Heart: Regular rate and rhythm no rubs murmurs or extra sounds Lungs: Clear to auscultation, breathing unlabored, no rales or wheezes Abdomen: Positive bowel sounds, soft nontender to palpation, nondistended Extremities: No clubbing, cyanosis, or edema Skin: No evidence of breakdown, no evidence of rash   Neuro: Pt is cognitively appropriate with normal insight, memory, and awareness. Cranial nerves 2-12 are intact. Patient is alert sitting up in bed.  Oriented x3 and follows commands. No tremors. Motor function is 4/ BUE and BLE .  Musculoskeletal: Full ROM, No pain with AROM or PROM  in the neck, trunk, or extremities. Posture appropriate Psych: flat affect    Assessment/Plan: 1. Functional deficits which require 3+ hours per day of interdisciplinary therapy in a comprehensive inpatient rehab setting.  Physiatrist is providing  close team supervision and 24 hour management of active medical problems listed below.  Physiatrist and rehab team continue to assess barriers to discharge/monitor patient progress toward functional and medical goals  Care Tool:  Bathing    Body parts bathed by patient: Front perineal area,Buttocks,Right arm,Left arm,Chest,Abdomen,Right upper leg,Left upper leg,Face     Body parts n/a: Right arm,Left arm,Chest,Abdomen,Left lower leg,Face,Right lower leg,Left upper leg,Right upper leg (pt declined)   Bathing assist Assist Level: Contact Guard/Touching assist     Upper Body Dressing/Undressing Upper body dressing   What is the patient wearing?: Hospital gown only    Upper body assist Assist Level: Supervision/Verbal cueing    Lower Body Dressing/Undressing Lower body dressing      What is the patient wearing?: Pants     Lower body assist Assist for lower body dressing: Supervision/Verbal cueing     Toileting Toileting    Toileting assist Assist for toileting: Supervision/Verbal cueing Assistive Device Comment: urinal   Transfers Chair/bed transfer  Transfers assist     Chair/bed transfer assist level: Supervision/Verbal cueing     Locomotion Ambulation   Ambulation assist      Assist level: Supervision/Verbal cueing Assistive device: Walker-rolling Max distance: ~350   Walk 10 feet activity   Assist     Assist level: Contact Guard/Touching assist Assistive device: Walker-rolling   Walk 50 feet activity   Assist    Assist level: Contact Guard/Touching assist Assistive device: Walker-rolling    Walk 150 feet activity   Assist    Assist level: Contact Guard/Touching assist Assistive  device: Walker-rolling    Walk 10 feet on uneven surface  activity   Assist Walk 10 feet on uneven surfaces activity did not occur: Safety/medical concerns         Wheelchair     Assist Will patient use wheelchair at discharge?: No Type of Wheelchair: Manual      Max wheelchair distance: 150    Wheelchair 50 feet with 2 turns activity    Assist        Assist Level: Minimal Assistance - Patient > 75%   Wheelchair 150 feet activity     Assist      Assist Level: Minimal Assistance - Patient > 75%   Blood pressure 111/68, pulse (!) 55, temperature 98.1 F (36.7 C), temperature source Oral, resp. rate 19, height 6' (1.829 m), weight 61 kg, SpO2 97 %.  Medical Problem List and Plan: 1.  Debility secondary to acute on chronic systolic congestive heart failure/cardiogenic shock/ischemic cardiomyopathy status post Impella insertion 05/03/2020 with removal of Impella assistive device 05/16/2020  12/21- has PICC RUE for Milrinone at home  12/22- will speak to Cardiology to see if can d/c Friday or not and get Milrinone set up for d/c.  12/23- not able to arrange til next week- d/c date 12/30   12/24- went over d/c date again with pt.              -patient may shower             -ELOS/Goals: 12/30 Mod I level  2.  Antithrombotics: -DVT/anticoagulation: Eliquis             -antiplatelet therapy: N/A 3. Pain Management: Oxycodone as needed- has not used in 5 days, may d/c. Main source of pain is sacral pressure injury- offload at least q2H to prevent pain and worsening of injury.   12/24- took tylenol this AM- let him know he has oxy as a  choice prn 4. Mood: Provide emotional support             -antipsychotic agents: N/A 5. Neuropsych: This patient is capable of making decisions on his own behalf. 6. Skin/Wound Care: Routine skin checks 12/21- keep off sacral decub- needs to eat MORE protein/calories to heal sacral decub-  7. Fluids/Electrolytes/Nutrition:  Routine in and outs with follow-up chemistries. Electrolytes stable on 12/20: monitor weekly. 8.  Atrial fibrillation/ischemic cardiomyopathy/chronic systolic congestive heart failure/history of PE.  Status post DCCV 05/10/2020.  Continue Lanoxin 0.125 mg daily, amiodarone 200 mg  daily, ProAmatine 2.5 mg 3 times daily, milrinone as directed, Aldactone 12.5 mg daily .  Followed closely by cardiology services.  12/21- on Milrinone gtt- to go home on this- has PICC;   12/22- will see if can get set up by Friday  12/23- not til next week- will d/c 12/30 9.  SLE.  Imuran 100 mg twice daily, Plaquenil 400 mg nightly.  Follow-up Dr. Gavin Pound rheumatology services. 10.  Hyperlipidemia.  Crestor 11.  BPH.  Flomax 0.4 mg daily.  Check PVR 12. Tachypneic to 23 on 12/20: monitor RR TID.   13. Poor appetite/Severe malnutrition  12/21- has lost >30 lbs in last 6 months- on marinol 2.5 mg BID- might increase after speaking with palliative care- has living will- is full code. Will order calorie count on pt.   12/22- will increase marinol to 5 mg BID per pt request.   12/24- pt says eating a little more-  14/ Constipation- likely making appetite worse  12/23- will give Sorbitol x1 this afternoon after therapy and add Senokot 2 tabs qAM  12/24- took suppository and enema last night- finally having BM, but stomach still hurting a lot- con't regimen - of note, had bowel accident.  Change senna S and colace to Senna S 2 po BID    LOS: 5 days A FACE TO FACE EVALUATION WAS PERFORMED  Charlett Blake 05/28/2020, 8:30 AM

## 2020-05-28 NOTE — Progress Notes (Signed)
Advanced Heart Failure Rounding Note  PCP-Cardiologist: Mertie Moores, MD   Subjective:    Discharged to CIR 12/20  On milrinone 0.125. Co-ox today 49% -> 66%  Feels ok. No CP or SOB. Pre-albumin 30.   Objective:   Weight Range: 61 kg Body mass index is 18.24 kg/m.   Vital Signs:   Temp:  [97.6 F (36.4 C)-98.1 F (36.7 C)] 98.1 F (36.7 C) (12/25 0635) Pulse Rate:  [55-58] 55 (12/25 0635) Resp:  [16-20] 19 (12/25 0635) BP: (101-119)/(53-69) 111/68 (12/25 0635) SpO2:  [96 %-99 %] 97 % (12/25 0635) Last BM Date: 05/27/20  Weight change: Filed Weights   05/24/20 0500 05/25/20 0632  Weight: 61 kg 61 kg    Intake/Output:   Intake/Output Summary (Last 24 hours) at 05/28/2020 0836 Last data filed at 05/28/2020 0725 Gross per 24 hour  Intake 740 ml  Output 850 ml  Net -110 ml      Physical Exam   General:  Thin.Frail appearing No resp difficulty HEENT: normal Neck: supple. JVP 7. Carotids 2+ bilat; no bruits. No lymphadenopathy or thryomegaly appreciated. Cor: PMI nondisplaced. Regular rate & rhythm. No rubs, gallops or murmurs. Lungs: clear Abdomen: soft, nontender, nondistended. No hepatosplenomegaly. No bruits or masses. Good bowel sounds. Extremities: no cyanosis, clubbing, rash, edema Neuro: alert & orientedx3, cranial nerves grossly intact. moves all 4 extremities w/o difficulty. Affect pleasant  Telemetry   N/A   Labs    CBC No results for input(s): WBC, NEUTROABS, HGB, HCT, MCV, PLT in the last 72 hours. Basic Metabolic Panel Recent Labs    05/27/20 0413  NA 139  K 4.2  CL 96*  CO2 32  GLUCOSE 107*  BUN 39*  CREATININE 1.23  CALCIUM 8.6*   Liver Function Tests No results for input(s): AST, ALT, ALKPHOS, BILITOT, PROT, ALBUMIN in the last 72 hours. No results for input(s): LIPASE, AMYLASE in the last 72 hours. Cardiac Enzymes No results for input(s): CKTOTAL, CKMB, CKMBINDEX, TROPONINI in the last 72 hours.  BNP: BNP (last 3  results) Recent Labs    04/06/20 2134 04/26/20 1319 04/30/20 0248  BNP 1,677.6* 1,824.4* 1,685.5*    ProBNP (last 3 results) No results for input(s): PROBNP in the last 8760 hours.   D-Dimer No results for input(s): DDIMER in the last 72 hours. Hemoglobin A1C No results for input(s): HGBA1C in the last 72 hours. Fasting Lipid Panel No results for input(s): CHOL, HDL, LDLCALC, TRIG, CHOLHDL, LDLDIRECT in the last 72 hours. Thyroid Function Tests No results for input(s): TSH, T4TOTAL, T3FREE, THYROIDAB in the last 72 hours.  Invalid input(s): FREET3  Other results:   Imaging    ECHOCARDIOGRAM LIMITED  Result Date: 05/27/2020    ECHOCARDIOGRAM LIMITED REPORT   [name] Benn Date of Exam: 05/27/2020 Medical Rec #:  [id number]    Height:       72.0 in Accession #:    [id number]   Weight:       134.5 lb Date of Birth:  21-Dec-1945    BSA:          1.800 m Patient Age:    74 years     BP:           119/69 mmHg Patient Gender: M            HR:           58 bpm. Exam Location:  Inpatient Procedure: Limited Echo, Cardiac Doppler and  Color Doppler Indications:     Aortic regurgitation  History:         Patient has prior history of Echocardiogram examinations, most                  recent 05/16/2020. CHF and Cardiomyopathy, Aortic Valve                  Disease; Arrythmias:Atrial Fibrillation. Post Impella removal.  Sonographer:     Dustin Flock Referring Phys:  [id number] Bryley Chrisman R Eulalio Reamy Diagnosing Phys: Carlyle Dolly MD IMPRESSIONS  1. Limited echo. The mid to distal anteroseptal,anterior walls are akinetic The apex is akinetic. The mid to distal anterolateral wall is hypokinetic. Left ventricular ejection fraction, by estimation, is 20 to 25%. The left ventricle has severely decreased function. The left ventricle demonstrates regional wall motion abnormalities (see scoring diagram/findings for description).  2. Right ventricular systolic function is normal. The right  ventricular size is mildly enlarged.  3. Moderate mitral valve regurgitation. No evidence of mitral stenosis.  4. The aortic valve is tricuspid. There is mild calcification of the aortic valve. There is mild thickening of the aortic valve. Aortic valve regurgitation is mild to moderate. No aortic stenosis is present. FINDINGS  Left Ventricle: Limited echo. The mid to distal anteroseptal,anterior walls are akinetic The apex is akinetic. The mid to distal anterolateral wall is hypokinetic. Left ventricular ejection fraction, by estimation, is 20 to 25%. The left ventricle has severely decreased function. The left ventricle demonstrates regional wall motion abnormalities. Right Ventricle: The right ventricular size is mildly enlarged. No increase in right ventricular wall thickness. Right ventricular systolic function is normal. Mitral Valve: There is mild thickening of the mitral valve leaflet(s). There is mild calcification of the mitral valve leaflet(s). Mild mitral annular calcification. Moderate mitral valve regurgitation. No evidence of mitral valve stenosis. Tricuspid Valve: The tricuspid valve is not assessed. Aortic Valve: The aortic valve is tricuspid. There is mild calcification of the aortic valve. There is mild thickening of the aortic valve. There is mild aortic valve annular calcification. Aortic valve regurgitation is mild to moderate. Aortic regurgitation PHT measures 573 msec. No aortic stenosis is present. Aortic valve mean gradient measures 9.0 mmHg. Aortic valve peak gradient measures 16.6 mmHg. Aortic valve area, by VTI measures 2.49 cm. Pulmonic Valve: The pulmonic valve was not well visualized. Pulmonic valve regurgitation is mild. No evidence of pulmonic stenosis. LEFT VENTRICLE PLAX 2D LVIDd:         5.70 cm LVIDs:         4.70 cm LV PW:         1.10 cm LV IVS:        0.90 cm LVOT diam:     2.40 cm LV SV:         90 LV SV Index:   50 LVOT Area:     4.52 cm  AORTIC VALVE AV Area (Vmax):     2.29 cm AV Area (Vmean):   1.89 cm AV Area (VTI):     2.49 cm AV Vmax:           203.50 cm/s AV Vmean:          144.000 cm/s AV VTI:            0.363 m AV Peak Grad:      16.6 mmHg AV Mean Grad:      9.0 mmHg LVOT Vmax:         103.00 cm/s LVOT  Vmean:        60.200 cm/s LVOT VTI:          0.200 m LVOT/AV VTI ratio: 0.55 AI PHT:            573 msec  SHUNTS Systemic VTI:  0.20 m Systemic Diam: 2.40 cm Carlyle Dolly MD Electronically signed by Carlyle Dolly MD Signature Date/Time: 05/27/2020/12:16:13 PM    Final (Updated)      Medications:     Scheduled Medications: . (feeding supplement) PROSource Plus  30 mL Oral BID BM  . amiodarone  200 mg Oral Daily  . apixaban  5 mg Oral BID  . aspirin EC  81 mg Oral Daily  . azaTHIOprine  100 mg Oral BID  . Chlorhexidine Gluconate Cloth  6 each Topical BID  . dapagliflozin propanediol  10 mg Oral Daily  . digoxin  0.125 mg Oral Daily  . docusate sodium  100 mg Oral BID  . dronabinol  5 mg Oral BID AC  . feeding supplement  237 mL Oral TID BM  . hydroxychloroquine  400 mg Oral QHS  . midodrine  2.5 mg Oral TID WC  . multivitamin with minerals  1 tablet Oral Daily  . polyvinyl alcohol  2 drop Both Eyes Daily  . rosuvastatin  20 mg Oral Daily  . senna  2 tablet Oral Daily  . sodium chloride flush  10-40 mL Intracatheter Q12H  . spironolactone  25 mg Oral Daily  . tamsulosin  0.4 mg Oral Daily  . torsemide  20 mg Oral Daily    Infusions: . milrinone 0.125 mcg/kg/min (05/26/20 1815)    PRN Medications: acetaminophen, bisacodyl, ondansetron, polyethylene glycol, sodium chloride flush, white petrolatum    Assessment/Plan   1. 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  Echo this admission looks worse than 10/21 with EF 20-25% with septal akinesis, mid to apical inferior akinesis, apical anterior and apex akinesis, moderately dilated and dysfunctional RV, moderate TR, severe MR, bicuspid aortic valve with no  stenosis, mild aortic insufficiency. Progressive/end-stage CHF worsened by onset of atrial fibrillation. Cardiogenic shock 11/30 with co-ox down to 30%, Impella 5.5 placed to allow time to stabilize and consider further steps.  Impella extracted 12/13. 0.125 milrinone added post explant for marginal co-ox at 51%. Remains on milrinone 0.125. Co-ox 49 % today.  He has end-stage biventricular HF. He is not a transplant candidate.  Barriers to LVAD include nutritional status/deconditioning and RV.  I think RV would be acceptable. We discussed in [place], not LVAD candidate at this point with malnutrition/deconditioning. He is now in [place] for aggressive PT. Nutritional status improving. Will continue on milrinone as bridge to VAD to allow time to improve nutritional status and mobility. - On milrinone 0.125 mcg/kg/min. Co-ox much improved  Today 49%-> 66%. Not sure if accurate Has been in 51-52% range.Will recheck tomorrow  - Volume status ok Continue torsemide 20 daily. Needs to be weighed - Off losartan with low BP.  - Cont midodrine 2.5 tid - Continue Spiro 25 mg daily  - Continue digoxin 0.125.   - Continue Farxiga 10 mg  2. CAD: Prior history of MI with LAD and ramus PCI. AdmittedMay 2021with anterior STEMI. LHC with occluded ostial LAD (in-stent), 90% ostial ramus, 60-70% proximal LCx, nondominant RCA. PTCA to LAD and ramus to restore flow, then CABG with LIMA-LAD and SVG-ramus.  No chest pain, doubt ACS.  Fall in EF from 10/21 to 11/21, but he did not present with  ACS.  LHC 12/1 showed patient LIMA-LAD, patent native LAD, patent dominant LCx.  The SVG-ramus is occluded with severe diffuse in-stent restenosis in proximal ramus.  Ramus not intervened upon as this would be a complex intervention and unlikely to markedly improve EF.  - No s/s angina - Continue ASA 81 and statin.    3. AKI: Creatinine now trending down with improved cardiac output. Renal function normalized.  Repeat BMP in am.  4. Right  pleural effusion: S/p thoracentesis, transudative (CHF).  5. Atrial fibrillation/flutter with RVR: DCCV on 12/3, went back to NSR 12/7. Regular on exam today - Continue amiodarone 200 mg once daily  - Continue apixaban 5 mg bid. No bleeding 6. Mitral regurgitation:  TEE 12/3 with moderate central MR.  Doubt MitraClip will help him much with moderate MR and he is likely too advanced.  7. H/o PE: In 10/21.  Keep anticoagulated, on apixaban. No bleeding  8. Bicuspid aortic valve: Mild AI, no AS.  9. SLE: H/o pericarditis.  On Imuran and hydroxychloroquine.  - Back on home SLE regimen  10. Pulmonary nodules/emphysema: CCM following, s/p bronch/BAL (no growth).  11. ID: Concern for right-sided PNA, ?aspiration.  Cultures NGTD.  Now afebrile and has completed course of vancomycin/cefepime.   - no change 12. Thrombocytopenia: Resolved.  May have been due to critical illness, low grade hemolysis with Impella. LDH ok.  HIT negative.  - Back on apixaban.  13. Hypokalemia/hypomag: Supplement as needed. 14. Acute blood loss anemia: No obvious site.   - resolved 15. VT: Episode early am 12/7, required DCCV.  No recurrence.  16. Deconditioning: -  Making progress in CIR. Continue therapy plan. Greatly appreciate CIR assistance  -  Nutritional status much improved. Prealbumin 30   Length of Stay: [place], [place]  05/28/2020, 8:36 AM  Advanced Heart Failure Team Pager [phone] (M-F; [place])  Please contact [facility] for night-coverage after hours (4p -7a ) and weekends on [email or web address]

## 2020-05-28 NOTE — Plan of Care (Signed)
  Problem: RH BLADDER ELIMINATION Goal: RH STG MANAGE BLADDER WITH ASSISTANCE Description: STG Manage Bladder With mod I Assistance Outcome: Progressing   Problem: RH SKIN INTEGRITY Goal: RH STG SKIN FREE OF INFECTION/BREAKDOWN Description: Prevent skin breakdown with mod I assist Outcome: Progressing   Problem: RH KNOWLEDGE DEFICIT GENERAL Goal: RH STG INCREASE KNOWLEDGE OF SELF CARE AFTER HOSPITALIZATION Description: Pt will be able to demonstrate understanding of medication regimen and dietary precautions to prevent complications related to cardiac issues with mod I assist using handouts provided.  Outcome: Progressing   Problem: Consults Goal: RH GENERAL PATIENT EDUCATION Description: See Patient Education module for education specifics. Outcome: Progressing

## 2020-05-29 ENCOUNTER — Inpatient Hospital Stay (HOSPITAL_COMMUNITY): Payer: No Typology Code available for payment source | Admitting: Occupational Therapy

## 2020-05-29 ENCOUNTER — Inpatient Hospital Stay (HOSPITAL_COMMUNITY): Payer: No Typology Code available for payment source

## 2020-05-29 LAB — BASIC METABOLIC PANEL
Anion gap: 10 (ref 5–15)
BUN: 36 mg/dL — ABNORMAL HIGH (ref 8–23)
CO2: 31 mmol/L (ref 22–32)
Calcium: 8.7 mg/dL — ABNORMAL LOW (ref 8.9–10.3)
Chloride: 98 mmol/L (ref 98–111)
Creatinine, Ser: 1.28 mg/dL — ABNORMAL HIGH (ref 0.61–1.24)
GFR, Estimated: 59 mL/min — ABNORMAL LOW (ref >60)
Glucose, Bld: 98 mg/dL (ref 70–99)
Potassium: 4.3 mmol/L (ref 3.5–5.1)
Sodium: 139 mmol/L (ref 135–145)

## 2020-05-29 LAB — COOXEMETRY PANEL
Carboxyhemoglobin: 2 % — ABNORMAL HIGH (ref 0.5–1.5)
Methemoglobin: 0.9 % (ref 0.0–1.5)
O2 Saturation: 67.3 %
Total hemoglobin: 9.5 g/dL — ABNORMAL LOW (ref 12.0–16.0)

## 2020-05-29 NOTE — Progress Notes (Signed)
Occupational Therapy Session Note  Patient Details  Name: Cody Arellano MRN: [id number] Date of Birth: November 16, 1945  Today's Date: 05/29/2020 OT Individual Time: 9476-5465 OT Individual Time Calculation (min): 60 min    Short Term Goals: Week 1:  OT Short Term Goal 1 (Week 1): STGs=LTGs due to ELOS  Skilled Therapeutic Interventions/Progress Updates: Patient particiated in various endurance actiivites during goals, especially in standing and functional mobiity positions with close S.  Patient did require frequent rest breaks during actitivities, such as standing oral care, funcitonal transfers and others endurance activities due to complaints of fatigue.  When asked what he assessed was left to work on that would benefit his activity and safety at home, he stated his endurance could be higher but that he would like to go home 'as soon as the doc will allow it.'      Therapy Documentation Precautions:  Precautions Precautions: Fall Precaution Comments: skin integrity- pressure sore on buttocks, restricted RUE Restrictions Weight Bearing Restrictions: No RUE Weight Bearing: Weight bearing as tolerated Other Position/Activity Restrictions: R impella General: General OT Amount of Missed Time: 45 Minutes of scheduled 2nd session - patient expressed wanting to rest during 1st session but participated and at time of scheduled 2nd afternoon session, he was sleeping.  Continue OT Plan of care and patient working toward goal attainment, particularly endurance for self care and functional mobility, and standing activities.   Pain:denied   Therapy/Group: Individual Therapy  Alfredia Ferguson [facility] 05/29/2020, 3:38 PM

## 2020-05-29 NOTE — Plan of Care (Signed)
  Problem: RH BLADDER ELIMINATION Goal: RH STG MANAGE BLADDER WITH ASSISTANCE Description: STG Manage Bladder With mod I Assistance Outcome: Progressing   Problem: RH SKIN INTEGRITY Goal: RH STG SKIN FREE OF INFECTION/BREAKDOWN Description: Prevent skin breakdown with mod I assist Outcome: Progressing   Problem: RH KNOWLEDGE DEFICIT GENERAL Goal: RH STG INCREASE KNOWLEDGE OF SELF CARE AFTER HOSPITALIZATION Description: Pt will be able to demonstrate understanding of medication regimen and dietary precautions to prevent complications related to cardiac issues with mod I assist using handouts provided.  Outcome: Progressing   Problem: Consults Goal: RH GENERAL PATIENT EDUCATION Description: See Patient Education module for education specifics. Outcome: Progressing   

## 2020-05-29 NOTE — Progress Notes (Signed)
Physical Therapy Session Note  Patient Details  Name: Cody Arellano MRN: [id number] Date of Birth: 1945/07/02  Today's Date: 05/29/2020 PT Individual Time: 0804-0859 PT Individual Time Calculation (min): 55 min   Short Term Goals: Week 1:  PT Short Term Goal 1 (Week 1): pt to demonstrate supine<>sit mod I PT Short Term Goal 2 (Week 1): pt to demonstrate functional transfers CGA PT Short Term Goal 3 (Week 1): pt to demonstrate ambulation with LRAD at community distances at [facility] PT Short Term Goal 4 (Week 1): pt to demonstrate dynamic standing balance at [facility]  Skilled Therapeutic Interventions/Progress Updates:     Pt received supine in bed and agreeable to therapy. No complaint of pain. Pt performs bilateral rolling and bridging to don new brief. Supine to sit with supervision and use of bed features. Pt performs sit to stand from EOB with RW and supervision, with cues on hand placement and body mechanics. Stand step transfer to [facility] with CGA and no AD. WC transport to gym for time management. Pt ambulates multiple bouts during session, practicing ambulation without AD to challenge dynamic balance. PT provides CGA and cues for upright gaze to improve posture and balance, and increasing gait speed to decrease risk for falls. Pt takes extended seated rest breaks between each bout with cues for pursed lip breathing to optimize oxygen sats. Ambulation bouts of x60', x100', x150', and x200'. Pt rates Borg RPE at 10/20 following final bout. Stand pivot transfer from mat>WC>bed with CGA. Sit to supine with supervision. Ptl left supine in bed with alarm intact and all needs within reach.  Therapy Documentation Precautions:  Precautions Precautions: Fall Precaution Comments: skin integrity- pressure sore on buttocks, restricted RUE Restrictions Weight Bearing Restrictions: No RUE Weight Bearing: Weight bearing as tolerated Other Position/Activity Restrictions: R impella  Therapy/Group: Individual  Therapy  Breck Coons 05/29/2020, 12:32 PM

## 2020-05-29 NOTE — Progress Notes (Signed)
Physical Therapy Session Note  Patient Details  Name: Cody Arellano MRN: [id number] Date of Birth: Jun 01, 1946  Today's Date: 05/29/2020 PT Individual Time: 7628-3151 PT Individual Time Calculation (min): 41 min   Short Term Goals: Week 1:  PT Short Term Goal 1 (Week 1): pt to demonstrate supine<>sit mod I PT Short Term Goal 2 (Week 1): pt to demonstrate functional transfers CGA PT Short Term Goal 3 (Week 1): pt to demonstrate ambulation with LRAD at community distances at [facility] PT Short Term Goal 4 (Week 1): pt to demonstrate dynamic standing balance at [facility]  Skilled Therapeutic Interventions/Progress Updates:     Pt received supine in bed and agrees to therapy. No complaint of pain. Supine to sit with supervision and use of bed features. Stand step transfer to [facility] with cues for positioning and hand placement. WC transport outside for time management and energy conservation. Pt ambulates over unlevel and varying surfaces without AD. Pt ambulates 275' and 300' with PT primarily providing CGA but occasional light minA due to slight LOBs, especially when pt ambulating on graded surfaces. Extended seated rest breaks between bouts of ambulation. Pt also ambulates in and out of parking barriers for practice with obstacle navigation and sudden direction changes. Pt performs stand pivot transfer back to bed. Sit to supine with supervision. Left supine in bed with all needs within reach.  Therapy Documentation Precautions:  Precautions Precautions: Fall Precaution Comments: skin integrity- pressure sore on buttocks, restricted RUE Restrictions Weight Bearing Restrictions: No RUE Weight Bearing: Weight bearing as tolerated Other Position/Activity Restrictions: R impella   Therapy/Group: Individual Therapy  Breck Coons, PT, DPT 05/29/2020, 4:06 PM

## 2020-05-30 ENCOUNTER — Inpatient Hospital Stay (HOSPITAL_COMMUNITY): Payer: No Typology Code available for payment source

## 2020-05-30 ENCOUNTER — Encounter (HOSPITAL_COMMUNITY): Payer: No Typology Code available for payment source | Admitting: Psychology

## 2020-05-30 ENCOUNTER — Inpatient Hospital Stay (HOSPITAL_COMMUNITY): Payer: No Typology Code available for payment source | Admitting: Occupational Therapy

## 2020-05-30 DIAGNOSIS — I5022 Chronic systolic (congestive) heart failure: Secondary | ICD-10-CM

## 2020-05-30 DIAGNOSIS — N400 Enlarged prostate without lower urinary tract symptoms: Secondary | ICD-10-CM

## 2020-05-30 DIAGNOSIS — K5901 Slow transit constipation: Secondary | ICD-10-CM

## 2020-05-30 DIAGNOSIS — M533 Sacrococcygeal disorders, not elsewhere classified: Secondary | ICD-10-CM

## 2020-05-30 LAB — CBC WITH DIFFERENTIAL/PLATELET
Abs Immature Granulocytes: 0.02 10*3/uL (ref 0.00–0.07)
Basophils Absolute: 0 10*3/uL (ref 0.0–0.1)
Basophils Relative: 1 %
Eosinophils Absolute: 0.2 10*3/uL (ref 0.0–0.5)
Eosinophils Relative: 4 %
HCT: 31.7 % — ABNORMAL LOW (ref 39.0–52.0)
Hemoglobin: 9.3 g/dL — ABNORMAL LOW (ref 13.0–17.0)
Immature Granulocytes: 0 %
Lymphocytes Relative: 23 %
Lymphs Abs: 1.4 10*3/uL (ref 0.7–4.0)
MCH: 27.2 pg (ref 26.0–34.0)
MCHC: 29.3 g/dL — ABNORMAL LOW (ref 30.0–36.0)
MCV: 92.7 fL (ref 80.0–100.0)
Monocytes Absolute: 0.7 10*3/uL (ref 0.1–1.0)
Monocytes Relative: 11 %
Neutro Abs: 3.7 10*3/uL (ref 1.7–7.7)
Neutrophils Relative %: 61 %
Platelets: 280 10*3/uL (ref 150–400)
RBC: 3.42 MIL/uL — ABNORMAL LOW (ref 4.22–5.81)
RDW: 20.8 % — ABNORMAL HIGH (ref 11.5–15.5)
WBC: 6 10*3/uL (ref 4.0–10.5)
nRBC: 0 % (ref 0.0–0.2)

## 2020-05-30 LAB — COOXEMETRY PANEL
Carboxyhemoglobin: 2 % — ABNORMAL HIGH (ref 0.5–1.5)
Methemoglobin: 0.7 % (ref 0.0–1.5)
O2 Saturation: 64.6 %
Total hemoglobin: 9.6 g/dL — ABNORMAL LOW (ref 12.0–16.0)

## 2020-05-30 NOTE — Progress Notes (Signed)
Physical Therapy Session Note  Patient Details  Name: Cody Arellano MRN: [id number] Date of Birth: 21-Jun-1945  Today's Date: 05/30/2020 PT Individual Time: 1400-1457 PT Individual Time Calculation (min): 57 min   Short Term Goals: Week 1:  PT Short Term Goal 1 (Week 1): pt to demonstrate supine<>sit mod I PT Short Term Goal 2 (Week 1): pt to demonstrate functional transfers CGA PT Short Term Goal 3 (Week 1): pt to demonstrate ambulation with LRAD at community distances at [facility] PT Short Term Goal 4 (Week 1): pt to demonstrate dynamic standing balance at [facility]  Skilled Therapeutic Interventions/Progress Updates:    Patient in supine and agreeable to PT, would like to try to go outside again.  S supine to sit, assist to don shoes in sitting.  Patient sit to stand and stand pivot to w/c with S.  Propelled w/c x 80' prior to fatigue.  Pushed in w/c to courtyard on first floor.  Patient ambulated on unlevel paved surfaces with CGA x 300' including turns and occasional min A for lateral LOB.  Patient ambulated in gift shop x 90' including tight turns and distracted by merchandise with CGA.  Assisted in w/c back to unit and in parallel bars worked on balance to include step taps to 4" step without UE support x 10 with close S.  On wobble board in parallel bars for work on hip and ankle strategy with intermittent UE support with R/L orientation then ant/post orientation.  Side step ups x 10 on R, then 10 on L in bars.  Patient ambulated x 200' with RW and S on level tile including turns.  Patient EOB encouraged to eat lunch so soup warmed for him.  Left resting in bed with bed alarm active and needs including lunch tray in reach.   Therapy Documentation Precautions:  Precautions Precautions: Fall Precaution Comments: skin integrity- pressure sore on buttocks, restricted RUE Restrictions Weight Bearing Restrictions: No RUE Weight Bearing: Weight bearing as tolerated Other Position/Activity Restrictions:  R impella Pain: Pain Assessment Pain Score: 0-No pain     Therapy/Group: Individual Therapy  Elray Mcgregor  Sheran Lawless, PT 05/30/2020, 2:58 PM

## 2020-05-30 NOTE — Progress Notes (Signed)
Patient ID: Cody Arellano, male   DOB: 1945-09-26, 74 y.o.   MRN: [id number]  Spoke with wife via telephone to schedule family education prior to discharge. She can come tomorrow at 1:00-3:00 for this. Have let team know.

## 2020-05-30 NOTE — Progress Notes (Signed)
Occupational Therapy Session Note  Patient Details  Name: Cody Arellano MRN: [id number] Date of Birth: 1945-07-18  Today's Date: 05/30/2020 OT Individual Time: 5361-4431 OT Individual Time Calculation (min): 54 min    Short Term Goals: Week 1:  OT Short Term Goal 1 (Week 1): STGs=LTGs due to ELOS  Skilled Therapeutic Interventions/Progress Updates:    Pt greeted at time of session supine in bed resting, IV running. No pain throghout session but rated fatigue at beginning of session 7/10 and end of session 8/10. Supine <> sit Supervision and pt ambulated to bathroom CGA fading to Supervision with therapist assist to manage IV pole and performing toileting urine only in standing. Walked back to bed in same manner, already performed ADL this am. Performed seated therex for BUE strengthening with lightweight 3# dowel within RUE restrictions and no overhead activity, bicep curl/chest press/FWD circle and shoulder rolls for endurance and strength. Declined out of room activity today d/t fatigue. Extensive conversation as well regarding maneuvering IV pump for home use to maneuver around bathing/dressing tasks and demonstrated for the pt how to tape and cover an IV for home showers. Pt in bed supine resting with RN present performing medpass. Hand off to nursing.   Therapy Documentation Precautions:  Precautions Precautions: Fall Precaution Comments: skin integrity- pressure sore on buttocks, restricted RUE Restrictions Weight Bearing Restrictions: No RUE Weight Bearing: Weight bearing as tolerated Other Position/Activity Restrictions: R impella     Therapy/Group: Individual Therapy  [name] 05/30/2020, 12:17 PM

## 2020-05-30 NOTE — Consult Note (Signed)
Neuropsychological Consultation   Patient:   Cody Arellano   DOB:   Apr 26, 1946  MR Number:  [id number]  Location:  [facility] [address] CENTER [address] [place] [id number] [place] [address] Dept: [place]: [phone]           Date of Service:   05/30/2020  Start Time:   9 AM End Time:   10 AM  Provider/Observer:  Ilean Skill, Psy.D.       Clinical Neuropsychologist       Billing Code/Service: [id number]  Chief Complaint:    Cody Arellano is a 74 year old male with history of significant lupus diagnosis in 2014, interstitial lung disease, CAD/CABG May 123XX123, chronic systolic congestive heart failure, PAF and recent PE.  Patient presented on 04/26/2020 with increasing shortness of breath over the prior 2 weeks with associated nausea and intermittent vomiting approximately 2-3 times per week.  Shortness of breath worsened with exertion and was barely able to walk 10 feet without becoming short of breath.  Patient also reported 20 pound unintentional weight loss since May 2021.  Patient with significant congestive heart failure.  Patient with intervention cardiology services.  Palliative care has been consulted to establish goals of care.  Due to generalized decline/debility related to congestive heart failure patient was admitted to the comprehensive inpatient rehabilitation program.  Reason for Service:  Patient was referred for neuropsychological consult due to coping and adjustment.  Patient is waiting on LVAD in a setting of chronic severe congestive heart failure.  Below see HPI for the current admission.  HPI: Cody Arellano is a 74 year old right-handed male with history significant for lupus diagnosed in 2014 maintained on Imuran as well as Plaquenil followed by Dr. Gavin Pound, interstitial lung disease, CAD/CABG May 123XX123, chronic systolic congestive heart failure, PAF on Eliquis and recent PE followed by cardiology  services Dr. Acie Fredrickson.  Per chart review patient lives with spouse.  Independent prior to admission.  Two-level home bed and bath upstairs.  Family with good support.  Presented 04/26/2020 with increasing shortness of breath x2 weeks with associated nausea and intermittent nonbilious nonbloody vomiting approximately 2-3 times per week.  Shortness of breath worsens with exertion and states could barely walk 10 feet without becoming short of breath and also reports 20 pound unintentional weight loss since May 2021.  Patient was recently admitted 4/21 due to acute on chronic systolic congestive heart failure.  In the ED patient was tachypneic, creatinine 1.45 from baseline 1.0-1.2, elevated BNP 1824.  Echocardiogram with ejection fraction of 20 to 123456 grade 3 diastolic dysfunction with septal akinesis as well as biventricular dysfunction along with severe mitral regurgitation.  CT of the chest with contrast showed poor opacification of bilateral lower lobe pulmonary arteries.  CT angiogram of the chest showed large right-sided pleural effusion.  There is also a small left-sided pleural effusion.  Patient did receive IV Lasix as well as placed on intravenous heparin followed by cardiology services as well as initiation of milrinone.  Critical care pulmonary services Dr. Valeta Harms consulted underwent flexible video fiberoptic bronchoscopy 04/29/2020 with biopsies that was unremarkable.  Follow-up CVTS with noted concern for low cardiac output and echocardiogram worsening since recent study patient underwent Impella insertion 05/03/2020 per Dr. 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  Hospital course underwent DCCV 05/10/2020 due to ongoing V. tach converted back to A. fib.  Later went into junctional rhythm amiodarone adjusted TEE  completed showing mildly dilated LV, EF 20% mildly dilated RV with moderately decreased systolic function, moderate MR.  He did remain in normal sinus rhythm after  cardiac adjustments and currently remains on amiodarone as directed.  Acute on chronic anemia latest hemoglobin 8.9.  Renal function remained stable latest creatinine 1.04.  Palliative care has been consulted to establish goals of care.  Tolerating a regular consistency diet.  Due to patient generalized decline debility related to congestive heart failure multimedical he was admitted for a comprehensive rehab program.   Current Status:  Upon entering the room, the patient was laying back in his bed and awake and oriented.  Patient with good mental status/cognition but admitted to being quite anxious about still being in the hospital and anxious about going home.  Patient has plan discharged on 06/01/2020 but even with this the patient is still feeling anxious about extended hospital stay.  Patient reports that he does feel like he is improving with ongoing PT/OT therapies and is beginning to see benefit particularly with being able to walk further patient has been able to ambulate 275 feet or more with occasional minimal assist with slight loss of balance at times.  Patient has been able to perform stand pivot transfers as well.  Patient has required frequent rest breaks during activities and continues to have issues with endurance and fatigue.  Patient denies any significant depression and reports that even with his anxiety and urgency to go home is able to actively work in therapeutic efforts.  Behavioral Observation: Cody Arellano  presents as a 74 y.o.-year-old Right African American Male who appeared his stated age. his dress was Appropriate and he was Well Groomed and his manners were Appropriate to the situation.  his participation was indicative of Appropriate and Redirectable behaviors.  There were physical disabilities noted.  he displayed an appropriate level of cooperation and motivation.     Interactions:    Active Appropriate and Redirectable  Attention:   abnormal and attention span appeared  shorter than expected for age  Memory:   within normal limits; recent and remote memory intact  Visuo-spatial:  not examined  Speech (Volume):  low  Speech:   normal; normal  Thought Process:  Coherent and Relevant  Though Content:  WNL; not suicidal and not homicidal  Orientation:   person, place, time/date and situation  Judgment:   Fair  Planning:   Poor  Affect:    Anxious  Mood:    Anxious  Insight:   Good  Intelligence:   normal  Medical History:   Past Medical History:  Diagnosis Date   Anxiety    CHF (congestive heart failure) (HCC)    Chronic tension headache    Colon polyps    Coronary artery disease 2008/2009   MI with PCI x 2, then PCI x 1 in 2009   Depression    Dyslipidemia    Dysrhythmia    atrial fibrillation   Hyperlipidemia    Hypertension    Lupus (HCC)    sees Dr Nickola Major   Lupus disease of the lung    Lupus pericarditis (HCC)    Myocardial infarction (HCC) 2008   S/P emergency CABG x 2 10/17/2019   LIMA to LAD, SVG to ramus intermediate, EVH via right thigh   Shortness of breath          Patient Active Problem List   Diagnosis Date Noted   Pressure injury of skin 05/24/2020   Palliative care by  specialist    Debility 05/23/2020   Cardiogenic shock ([place])    LVAD (left ventricular assist device) present ([place])    Acute on chronic congestive heart failure (HCC)    Pleural effusion on right 04/26/2020   Shortness of breath 04/26/2020   Elevated brain natriuretic peptide (BNP) level 04/26/2020   AKI (acute kidney injury) ([place]) 04/26/2020   Pulmonary nodules 04/26/2020   Nausea & vomiting 04/26/2020   Valvular heart disease 04/08/2020   Malnutrition of mild degree ([place]) 04/08/2020   Acute on chronic systolic CHF (congestive heart failure) ([place]) 04/07/2020   Pulmonary embolism ([place]) [id number]   Chronic systolic heart failure ([place]) 03/21/2020   Fatigue 03/09/2020   Atherosclerotic heart disease of  native coronary artery with angina pectoris ([place]) 03/09/2020   Protein-calorie malnutrition, severe ([place]) 03/09/2020   Hypotension 01/29/2020   Anemia 01/29/2020   Prostatic hypertrophy 01/29/2020   Pancreatic lesion 01/29/2020   Rectal abscess 01/28/2020   S/P emergency CABG x 2 10/17/2019   Paroxysmal atrial fibrillation (HCC) 01/20/2019   Chronic tension headache    Goals of care, counseling/discussion 01/09/2018   Memory loss 01/09/2018   Advance directive discussed with patient 01/09/2018   Hyperlipemia 01/17/2016   BPH with obstruction/lower urinary tract symptoms 01/17/2016   Personal history of colonic polyps 05/20/2013   Episodic mood disorder ([place]) 05/13/2013   Systemic lupus erythematosus ([place]) 05/13/2013   Colon polyps    CAD (coronary artery disease) 04/09/2013   Lupus disease of the lung 02/04/2013   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  Patient denies any severe depression at this time limiting his ability to participate in therapeutic efforts.  He does acknowledge feeling anxious about going home as soon as he can.  Patient reports that he feels like he is able to manage long enough to get to his discharge and sees benefits that he is experiencing with therapy.  Family Med/Psych History:  Family History  Problem Relation Age of Onset   Heart disease Mother    Alcohol abuse Father    Hyperlipidemia Sister    Hypertension Sister    Hyperlipidemia Brother    Hypertension Brother    Cancer Brother        ?lung cancer   Stomach cancer Brother    Diabetes Paternal Uncle    Colon cancer Neg Hx    Esophageal cancer Neg Hx    Rectal cancer Neg Hx    Impression/DX:  Cody Arellano is a 74 year old male with history of significant lupus diagnosis in 2014, interstitial lung disease,  CAD/CABG May 123XX123, chronic systolic congestive heart failure, PAF and recent PE.  Patient presented on 04/26/2020 with increasing shortness of breath over the prior 2 weeks with associated nausea and intermittent vomiting approximately 2-3 times per week.  Shortness of breath worsened with exertion and was barely able to walk 10 feet without becoming short of breath.  Patient also reported 20 pound unintentional weight loss since May 2021.  Patient with significant congestive heart failure.  Patient with intervention cardiology services.  Palliative care has been consulted to establish goals of care.  Due to generalized decline/debility related to congestive heart failure patient was admitted to the comprehensive inpatient rehabilitation program.  Patient does have a past medical history including depression and anxiety.  Patient denies any severe depression at this time limiting his ability to  participate in therapeutic efforts.  He does acknowledge feeling anxious about going home as soon as he can.  Patient reports that he feels like he is able to manage long enough to get to his discharge and sees benefits that he is experiencing with therapy.  Upon entering the room, the patient was laying back in his bed and awake and oriented.  Patient with good mental status/cognition but admitted to being quite anxious about still being in the hospital and anxious about going home.  Patient has plan discharged on 06/01/2020 but even with this the patient is still feeling anxious about extended hospital stay.  Patient reports that he does feel like he is improving with ongoing PT/OT therapies and is beginning to see benefit particularly with being able to walk further patient has been able to ambulate 275 feet or more with occasional minimal assist with slight loss of balance at times.  Patient has been able to perform stand pivot transfers as well.  Patient has required frequent rest breaks during activities and  continues to have issues with endurance and fatigue.  Patient denies any significant depression and reports that even with his anxiety and urgency to go home is able to actively work in therapeutic efforts.   Disposition/Plan:  Today we worked on coping and adjustment issues around issues related to extended hospital stay and rehab.  Patient acknowledges making significant improvements and is now able to walk with supervision and min assist at times up to 300 feet.  Patient is making significant functional gains but continues to have fatigue and needs rest between therapeutic efforts.  The patient's anxiety is having an impact on his motivation and comfort during CIR but the patient is agreeable to continue to work hard through to his discharge.  Diagnosis:    Debility, CHF         Electronically Signed   _______________________ Arley Phenix, Psy.D. Clinical Neuropsychologist

## 2020-05-30 NOTE — Progress Notes (Signed)
Occupational Therapy Session Note  Patient Details  Name: Cody Arellano MRN: [id number] Date of Birth: [birth year]  Today's Date: 05/30/2020 OT Individual Time: 9485-4627 OT Individual Time Calculation (min): 58 min    Short Term Goals: Week 1:  OT Short Term Goal 1 (Week 1): STGs=LTGs due to ELOS  Skilled Therapeutic Interventions/Progress Updates:    Pt received supine in bed with RN present, agreeable to get washed up. RN took vitals, HR at 57 bpm. Pt completed bed mobility with close S. Sit to stand + amb to w/c at sink with RW + close S. Doffed brief, hospital gown, bathed UB, and brushed teeth with close S, seated in w/c. Declined washing LB this date as he stated nursing had already cleaned him up this morning. Donned new brief + scrub pants + new gown with close S. Donned TEDs with total A, provided edu on how to don, pt states his wife will do this. Donned B gripper socks with close S.  Amb to therapy gym with RW + close S. Completed Nustep at the following resistance levels for improved activity tolerance: 4 min at level 4 resistance, req ~2 min rest break. 2 min at level 5 resistance.   Reported 7/10 level on Rate of Perceived Exertion Scale after activity. Amb back to room, stand-pivot back to bed, and sit > sup with close sup + RW.  HR remained at 57-68 bpm throughout session, and pt denied pain.   Pt left side-lying in bed, bed alarm engaged, call bell in reach, all immediate needs met.    Therapy Documentation Precautions:  Precautions Precautions: Fall Precaution Comments: skin integrity- pressure sore on buttocks, restricted RUE Restrictions Weight Bearing Restrictions: No RUE Weight Bearing: Weight bearing as tolerated Other Position/Activity Restrictions: R impella   Vital Signs: see session note  Pain: Pain Assessment Pain Scale: 0-10 Pain Score: 0-No pain ADL: See Care Tool for more details.  Therapy/Group: Individual Therapy  Volanda Napoleon 05/30/2020, 8:58 AM

## 2020-05-30 NOTE — Progress Notes (Signed)
Discussed with Dr Shirlee Latch. Will need Life Vest for d/c   I personally called [employer] rep regarding order placed in Epic>   Boen Sterbenz NP-C  3:33 PM

## 2020-05-30 NOTE — Progress Notes (Signed)
Patient ID: Cody Arellano, male   DOB: [birth year], 74 y.o.   MRN: [id number] Called from [employer] requesting medical records to justify a life vest prior to discharge on Thursday. Faxed information to Alvino Chapel, she is to work on prior [place] to set up prior to discharge Thursday.

## 2020-05-30 NOTE — Progress Notes (Signed)
[facility] PHYSICAL MEDICINE & REHABILITATION PROGRESS NOTE   Subjective/Complaints: Patient seen sitting up in bed this morning.  He states he slept well overnight.  He is question regarding discharge date and infusion at discharge.  He is seen by cardiology yesterday, notes reviewed-continue current medications.   ROS: Denies CP, SOB, N/V/D  Objective:   No results found. Recent Labs    05/30/20 0407  WBC 6.0  HGB 9.3*  HCT 31.7*  PLT 280   Recent Labs    05/29/20 0435  NA 139  K 4.3  CL 98  CO2 31  GLUCOSE 98  BUN 36*  CREATININE 1.28*  CALCIUM 8.7*    Intake/Output Summary (Last 24 hours) at 05/30/2020 1317 Last data filed at 05/30/2020 0700 Gross per 24 hour  Intake 645.21 ml  Output 1125 ml  Net -479.79 ml     Pressure Injury 05/23/20 Coccyx Lower;Mid Stage 2 -  Partial thickness loss of dermis presenting as a shallow open injury with a red, pink wound bed without slough. has a blister appearance. previously charted as skin tear. (Active)  05/23/20 1630  Location: Coccyx  Location Orientation: Lower;Mid  Staging: Stage 2 -  Partial thickness loss of dermis presenting as a shallow open injury with a red, pink wound bed without slough.  Wound Description (Comments): has a blister appearance. previously charted as skin tear.  Present on Admission: Yes    Physical Exam: Vital Signs Blood pressure 108/65, pulse (!) 57, temperature 98.4 F (36.9 C), resp. rate 18, height 6' (1.829 m), weight 57.9 kg, SpO2 99 %. Constitutional: No distress . Vital signs reviewed. HENT: Normocephalic.  Atraumatic. Eyes: EOMI. No discharge. Cardiovascular: No JVD.  RRR. Respiratory: Normal effort.  No stridor.  Bilateral clear to auscultation. GI: Non-distended.  BS +. Skin: Warm and dry.  Intact. Psych: Normal mood.  Normal behavior. Musc: No edema in extremities.  No tenderness in extremities. Neuro: Alert Motor: Grossly 4-4+/5 throughout  Assessment/Plan: 1.  Functional deficits which require 3+ hours per day of interdisciplinary therapy in a comprehensive inpatient rehab setting.  Physiatrist is providing close team supervision and 24 hour management of active medical problems listed below.  Physiatrist and rehab team continue to assess barriers to discharge/monitor patient progress toward functional and medical goals  Care Tool:  Bathing    Body parts bathed by patient: Front perineal area,Buttocks,Right arm,Left arm,Chest,Abdomen,Right upper leg,Left upper leg,Face     Body parts n/a: Right arm,Left arm,Chest,Abdomen,Left lower leg,Face,Right lower leg,Left upper leg,Right upper leg (pt declined)   Bathing assist Assist Level: Contact Guard/Touching assist     Upper Body Dressing/Undressing Upper body dressing   What is the patient wearing?: Hospital gown only    Upper body assist Assist Level: Supervision/Verbal cueing    Lower Body Dressing/Undressing Lower body dressing      What is the patient wearing?: Pants     Lower body assist Assist for lower body dressing: Supervision/Verbal cueing     Toileting Toileting    Toileting assist Assist for toileting: Supervision/Verbal cueing Assistive Device Comment: urinal   Transfers Chair/bed transfer  Transfers assist     Chair/bed transfer assist level: Supervision/Verbal cueing     Locomotion Ambulation   Ambulation assist      Assist level: Contact Guard/Touching assist Assistive device: No Device Max distance: 200'   Walk 10 feet activity   Assist     Assist level: Contact Guard/Touching assist Assistive device: No Device   Walk 50 feet  activity   Assist    Assist level: Contact Guard/Touching assist Assistive device: No Device    Walk 150 feet activity   Assist    Assist level: Contact Guard/Touching assist Assistive device: No Device    Walk 10 feet on uneven surface  activity   Assist Walk 10 feet on uneven surfaces activity  did not occur: Safety/medical concerns         Wheelchair     Assist Will patient use wheelchair at discharge?: No Type of Wheelchair: Manual      Max wheelchair distance: 150    Wheelchair 50 feet with 2 turns activity    Assist        Assist Level: Minimal Assistance - Patient > 75%   Wheelchair 150 feet activity     Assist      Assist Level: Minimal Assistance - Patient > 75%    Medical Problem List and Plan: 1.  Debility secondary to acute on chronic systolic congestive heart failure/cardiogenic shock/ischemic cardiomyopathy status post Impella insertion 05/03/2020 with removal of Impella assistive device 05/16/2020  PICC RUE for Milrinone at home  Continue CIR 2.  Antithrombotics: -DVT/anticoagulation: Eliquis             -antiplatelet therapy: N/A 3. Pain Management:   Main source of pain is sacral pressure injury- offload at least q2H to prevent pain and worsening of injury.   Controlled with meds on 12/27 4. Mood: Provide emotional support             -antipsychotic agents: N/A 5. Neuropsych: This patient is capable of making decisions on his own behalf. 6. Skin/Wound Care: Routine skin checks  Optimize nutrition 7. Fluids/Electrolytes/Nutrition: Routine in and outs 8.  Atrial fibrillation/ischemic cardiomyopathy/chronic systolic congestive heart failure/history of PE.  Status post DCCV 05/10/2020.    Continue Lanoxin 0.125 mg daily, amiodarone 200 mg  daily, ProAmatine 2.5 mg 3 times daily, milrinone as directed, Aldactone 12.5 mg daily .  Followed closely by cardiology services.  Continue milrinone gtt- to go home on this- has PICC; discussed with patient 9.  SLE.  Imuran 100 mg twice daily, Plaquenil 400 mg nightly.  Follow-up Dr. Zenovia Jordan rheumatology services. 10.  Hyperlipidemia.    Crestor 11.  BPH.  Flomax 0.4 mg daily.    PVRs unremarkable 12. Poor appetite/Severe malnutrition  Marinol increased to 5 mg BID per pt request.  13.   Slow transit constipation- likely making appetite worse  Change senna S and colace to Senna S 2 po BID   Improving  LOS: 7 days A FACE TO FACE EVALUATION WAS PERFORMED  Shadow Stiggers Karis Juba 05/30/2020, 1:17 PM

## 2020-05-30 NOTE — Progress Notes (Addendum)
Advanced Heart Failure Rounding Note  PCP-Cardiologist: Mertie Moores, MD   Subjective:    Discharged to CIR 12/20  On milrinone 0.125. Co-ox 65%.   Feeling ok. Denies SOB.    Objective:   Weight Range: 57.9 kg Body mass index is 17.31 kg/m.   Vital Signs:   Temp:  [97.8 F (36.6 C)-99.1 F (37.3 C)] 98.4 F (36.9 C) (12/27 0743) Pulse Rate:  [52-57] 56 (12/27 0743) Resp:  [18-20] 18 (12/27 0743) BP: (105-112)/(57-66) 108/65 (12/27 0743) SpO2:  [98 %-100 %] 99 % (12/27 0743) Weight:  [57.9 kg] 57.9 kg (12/27 0651) Last BM Date: 05/28/20  Weight change: Filed Weights   05/25/20 0632 05/28/20 1721 05/30/20 0651  Weight: 61 kg 59.6 kg 57.9 kg    Intake/Output:   Intake/Output Summary (Last 24 hours) at 05/30/2020 0750 Last data filed at 05/30/2020 0629 Gross per 24 hour  Intake 765.21 ml  Output 1400 ml  Net -634.79 ml      Physical Exam   General:   No resp difficulty. Sitting in the chair.  HEENT: normal Neck: supple. no JVD. Carotids 2+ bilat; no bruits. No lymphadenopathy or thryomegaly appreciated. Cor: PMI nondisplaced. Regular rate & rhythm. No rubs, gallops or murmurs. Lungs: clear Abdomen: soft, nontender, nondistended. No hepatosplenomegaly. No bruits or masses. Good bowel sounds. Extremities: no cyanosis, clubbing, rash, edema. RUE PICC Neuro: alert & orientedx3, cranial nerves grossly intact. moves all 4 extremities w/o difficulty. Affect pleasant    Labs    CBC Recent Labs    05/30/20 0407  WBC 6.0  NEUTROABS 3.7  HGB 9.3*  HCT 31.7*  MCV 92.7  PLT 702   Basic Metabolic Panel Recent Labs    05/29/20 0435  NA 139  K 4.3  CL 98  CO2 31  GLUCOSE 98  BUN 36*  CREATININE 1.28*  CALCIUM 8.7*   Liver Function Tests No results for input(s): AST, ALT, ALKPHOS, BILITOT, PROT, ALBUMIN in the last 72 hours. No results for input(s): LIPASE, AMYLASE in the last 72 hours. Cardiac Enzymes No results for input(s): CKTOTAL,  CKMB, CKMBINDEX, TROPONINI in the last 72 hours.  BNP: BNP (last 3 results) Recent Labs    04/06/20 2134 04/26/20 1319 04/30/20 0248  BNP 1,677.6* 1,824.4* 1,685.5*    ProBNP (last 3 results) No results for input(s): PROBNP in the last 8760 hours.   D-Dimer No results for input(s): DDIMER in the last 72 hours. Hemoglobin A1C No results for input(s): HGBA1C in the last 72 hours. Fasting Lipid Panel No results for input(s): CHOL, HDL, LDLCALC, TRIG, CHOLHDL, LDLDIRECT in the last 72 hours. Thyroid Function Tests No results for input(s): TSH, T4TOTAL, T3FREE, THYROIDAB in the last 72 hours.  Invalid input(s): FREET3  Other results:   Imaging    No results found.   Medications:     Scheduled Medications: . (feeding supplement) PROSource Plus  30 mL Oral BID BM  . amiodarone  200 mg Oral Daily  . apixaban  5 mg Oral BID  . aspirin EC  81 mg Oral Daily  . azaTHIOprine  100 mg Oral BID  . Chlorhexidine Gluconate Cloth  6 each Topical BID  . dapagliflozin propanediol  10 mg Oral Daily  . digoxin  0.125 mg Oral Daily  . dronabinol  5 mg Oral BID AC  . feeding supplement  237 mL Oral TID BM  . hydroxychloroquine  400 mg Oral QHS  . midodrine  2.5 mg Oral TID WC  .  multivitamin with minerals  1 tablet Oral Daily  . polyvinyl alcohol  2 drop Both Eyes Daily  . rosuvastatin  20 mg Oral Daily  . senna-docusate  2 tablet Oral BID  . sodium chloride flush  10-40 mL Intracatheter Q12H  . spironolactone  25 mg Oral Daily  . tamsulosin  0.4 mg Oral Daily  . torsemide  20 mg Oral Daily    Infusions: . milrinone 0.123 mcg/kg/min (05/29/20 1902)    PRN Medications: acetaminophen, bisacodyl, ondansetron, polyethylene glycol, sodium chloride flush, white petrolatum    Assessment/Plan   1. 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  Echo this admission looks worse than 10/21 with EF 20-25% with septal akinesis, mid to apical inferior  akinesis, apical anterior and apex akinesis, moderately dilated and dysfunctional RV, moderate TR, severe MR, bicuspid aortic valve with no stenosis, mild aortic insufficiency. Progressive/end-stage CHF worsened by onset of atrial fibrillation. Cardiogenic shock 11/30 with co-ox down to 30%, Impella 5.5 placed to allow time to stabilize and consider further steps.  Impella extracted 12/13. 0.125 milrinone added post explant for marginal co-ox at 51%. Remains on milrinone 0.125. Co-ox 49 % today.  He has end-stage biventricular HF. He is not a transplant candidate.  Barriers to LVAD include nutritional status/deconditioning and RV.  I think RV would be acceptable. We discussed in [place], not LVAD candidate at this point with malnutrition/deconditioning. He is now in [place] for aggressive PT. Nutritional status improving. Will continue on milrinone as bridge to VAD to allow time to improve nutritional status and mobility. - On milrinone 0.125 mcg/kg/min. Co-ox stable 65%. No bb with low output.  - Continue torsemide 20 daily.  - Off losartan with low BP.  - Cont midodrine 2.5 tid - Continue Spiro 25 mg daily  - Continue digoxin 0.125.   - Continue Farxiga 10 mg  2. CAD: Prior history of MI with LAD and ramus PCI. AdmittedMay 2021with anterior STEMI. LHC with occluded ostial LAD (in-stent), 90% ostial ramus, 60-70% proximal LCx, nondominant RCA. PTCA to LAD and ramus to restore flow, then CABG with LIMA-LAD and SVG-ramus.  No chest pain, doubt ACS.  Fall in EF from 10/21 to 11/21, but he did not present with ACS.  [place] 12/1 showed patient LIMA-LAD, patent native LAD, patent dominant LCx.  The SVG-ramus is occluded with severe diffuse in-stent restenosis in proximal ramus.  Ramus not intervened upon as this would be a complex intervention and unlikely to markedly improve EF.  - No chest pain.  - Continue ASA 81 and statin.    3. AKI: Creatinine now trending down with improved cardiac output. Renal function  normalized.   - No bmet today.  4. Right pleural effusion: S/p thoracentesis, transudative (CHF).  5. Atrial fibrillation/flutter with RVR: DCCV on 12/3, went back to NSR 12/7.  - Regular on exam  - Continue amiodarone 200 mg once daily  - Continue apixaban 5 mg bid. No bleeding 6. Mitral regurgitation:  TEE 12/3 with moderate central MR.  Doubt MitraClip will help him much with moderate MR and he is likely too advanced.  7. H/o PE: In 10/21.  Keep anticoagulated, on apixaban. No bleeding  8. Bicuspid aortic valve: Mild AI, no AS.  9. SLE: H/o pericarditis.  On Imuran and hydroxychloroquine.  - Back on home SLE regimen  10. Pulmonary nodules/emphysema: CCM following, s/p bronch/BAL (no growth).  11. ID: Concern for right-sided PNA, ?aspiration.  Cultures NGTD.  Now afebrile and has completed course  of vancomycin/cefepime.   - no change 12. Thrombocytopenia: Resolved.  May have been due to critical illness, low grade hemolysis with Impella. LDH ok.  HIT negative.  - Back on apixaban.  - Resolved.  13. Hypokalemia/hypomag: Supplement as needed. 14. Acute blood loss anemia: Hgb stable at 9.3.  15. VT: Episode early am 12/7, required DCCV.  Off monitor. No recurrence.  16. Deconditioning: -  Making progress in CIR. Continue therapy plan.  -  Nutritional status much improved. Prealbumin 30  We will set up HF follow up.   Discussed with Carolynn Sayers possible d/c later this week.   Length of Stay: Roy, NP  05/30/2020, 7:50 AM  Advanced Heart Failure Team Pager [phone] (M-F; 7a - 4p)  Please contact [facility] for night-coverage after hours (4p -7a ) and weekends on [email or web address]  Patient seen with NP, agree with the above note.   Co-ox 65%, creatinine stable at 1.28.  Prealbumin up to 30.  Walking with PT, no problems.   General: NAD Neck: No JVD, no thyromegaly or thyroid nodule.  Lungs: Clear to auscultation bilaterally with normal respiratory effort. CV: Lateral  PMI.  Heart regular S1/S2, no L5/Q4, 2/6 diastolic murmur USB.  No peripheral edema.  No carotid bruit.  Normal pedal pulses.  Abdomen: Soft, nontender, no hepatosplenomegaly, no distention.  Skin: Intact without lesions or rashes.  Neurologic: Alert and oriented x 3.  Psych: Normal affect. Extremities: No clubbing or cyanosis.  HEENT: Normal.   Regular rhythm, repeat ECG to make sure NSR.   - Continue amiodarone and apixaban.   Volume status looks ok.  Good co-ox.   - Continue current milrinone 0.125, chronic at this point.  - Will try stopping midodrine.  - Will need Lifevest when he goes home (no ICD).  - Will need reconsideration of LVAD placement with improved nutritional status (normal pre-albumin) and doing well functionally, discuss in [facility] next week.   Loralie Champagne 05/30/2020 2:55 PM

## 2020-05-31 ENCOUNTER — Inpatient Hospital Stay (HOSPITAL_COMMUNITY): Payer: No Typology Code available for payment source

## 2020-05-31 ENCOUNTER — Inpatient Hospital Stay (HOSPITAL_COMMUNITY)
Admission: AD | Admit: 2020-05-31 | Discharge: 2020-06-10 | DRG: 296 | Disposition: A | Discharge status: Hospice | Payer: No Typology Code available for payment source | Source: Ambulatory Visit | Attending: Cardiology | Admitting: Cardiology

## 2020-05-31 ENCOUNTER — Ambulatory Visit (HOSPITAL_COMMUNITY): Payer: No Typology Code available for payment source

## 2020-05-31 ENCOUNTER — Encounter (HOSPITAL_COMMUNITY): Payer: No Typology Code available for payment source | Admitting: Occupational Therapy

## 2020-05-31 DIAGNOSIS — E785 Hyperlipidemia, unspecified: Secondary | ICD-10-CM | POA: Diagnosis present

## 2020-05-31 DIAGNOSIS — Z681 Body mass index (BMI) 19 or less, adult: Secondary | ICD-10-CM

## 2020-05-31 DIAGNOSIS — M329 Systemic lupus erythematosus, unspecified: Secondary | ICD-10-CM | POA: Diagnosis present

## 2020-05-31 DIAGNOSIS — E876 Hypokalemia: Secondary | ICD-10-CM | POA: Diagnosis present

## 2020-05-31 DIAGNOSIS — G44229 Chronic tension-type headache, not intractable: Secondary | ICD-10-CM | POA: Diagnosis present

## 2020-05-31 DIAGNOSIS — Z87891 Personal history of nicotine dependence: Secondary | ICD-10-CM

## 2020-05-31 DIAGNOSIS — I480 Paroxysmal atrial fibrillation: Secondary | ICD-10-CM | POA: Diagnosis present

## 2020-05-31 DIAGNOSIS — R570 Cardiogenic shock: Secondary | ICD-10-CM | POA: Diagnosis not present

## 2020-05-31 DIAGNOSIS — J849 Interstitial pulmonary disease, unspecified: Secondary | ICD-10-CM | POA: Diagnosis present

## 2020-05-31 DIAGNOSIS — Z7901 Long term (current) use of anticoagulants: Secondary | ICD-10-CM

## 2020-05-31 DIAGNOSIS — I472 Ventricular tachycardia: Secondary | ICD-10-CM | POA: Diagnosis present

## 2020-05-31 DIAGNOSIS — M533 Sacrococcygeal disorders, not elsewhere classified: Secondary | ICD-10-CM | POA: Diagnosis not present

## 2020-05-31 DIAGNOSIS — Z7902 Long term (current) use of antithrombotics/antiplatelets: Secondary | ICD-10-CM

## 2020-05-31 DIAGNOSIS — I4892 Unspecified atrial flutter: Secondary | ICD-10-CM | POA: Diagnosis present

## 2020-05-31 DIAGNOSIS — M3212 Pericarditis in systemic lupus erythematosus: Secondary | ICD-10-CM | POA: Diagnosis present

## 2020-05-31 DIAGNOSIS — I462 Cardiac arrest due to underlying cardiac condition: Secondary | ICD-10-CM | POA: Diagnosis not present

## 2020-05-31 DIAGNOSIS — Z515 Encounter for palliative care: Secondary | ICD-10-CM

## 2020-05-31 DIAGNOSIS — J9601 Acute respiratory failure with hypoxia: Secondary | ICD-10-CM | POA: Diagnosis present

## 2020-05-31 DIAGNOSIS — N179 Acute kidney failure, unspecified: Secondary | ICD-10-CM | POA: Diagnosis present

## 2020-05-31 DIAGNOSIS — F419 Anxiety disorder, unspecified: Secondary | ICD-10-CM | POA: Diagnosis present

## 2020-05-31 DIAGNOSIS — I2510 Atherosclerotic heart disease of native coronary artery without angina pectoris: Secondary | ICD-10-CM | POA: Diagnosis present

## 2020-05-31 DIAGNOSIS — R188 Other ascites: Secondary | ICD-10-CM | POA: Diagnosis present

## 2020-05-31 DIAGNOSIS — I5084 End stage heart failure: Secondary | ICD-10-CM | POA: Diagnosis present

## 2020-05-31 DIAGNOSIS — D696 Thrombocytopenia, unspecified: Secondary | ICD-10-CM | POA: Diagnosis present

## 2020-05-31 DIAGNOSIS — Q231 Congenital insufficiency of aortic valve: Secondary | ICD-10-CM | POA: Diagnosis not present

## 2020-05-31 DIAGNOSIS — K5900 Constipation, unspecified: Secondary | ICD-10-CM | POA: Diagnosis not present

## 2020-05-31 DIAGNOSIS — Z951 Presence of aortocoronary bypass graft: Secondary | ICD-10-CM

## 2020-05-31 DIAGNOSIS — I469 Cardiac arrest, cause unspecified: Principal | ICD-10-CM | POA: Diagnosis present

## 2020-05-31 DIAGNOSIS — R531 Weakness: Secondary | ICD-10-CM | POA: Diagnosis not present

## 2020-05-31 DIAGNOSIS — I110 Hypertensive heart disease with heart failure: Secondary | ICD-10-CM | POA: Diagnosis present

## 2020-05-31 DIAGNOSIS — E43 Unspecified severe protein-calorie malnutrition: Secondary | ICD-10-CM | POA: Diagnosis present

## 2020-05-31 DIAGNOSIS — J439 Emphysema, unspecified: Secondary | ICD-10-CM | POA: Diagnosis present

## 2020-05-31 DIAGNOSIS — R319 Hematuria, unspecified: Secondary | ICD-10-CM | POA: Diagnosis not present

## 2020-05-31 DIAGNOSIS — Z66 Do not resuscitate: Secondary | ICD-10-CM | POA: Diagnosis present

## 2020-05-31 DIAGNOSIS — J9602 Acute respiratory failure with hypercapnia: Secondary | ICD-10-CM

## 2020-05-31 DIAGNOSIS — Z01818 Encounter for other preprocedural examination: Secondary | ICD-10-CM

## 2020-05-31 DIAGNOSIS — D509 Iron deficiency anemia, unspecified: Secondary | ICD-10-CM | POA: Diagnosis present

## 2020-05-31 DIAGNOSIS — I340 Nonrheumatic mitral (valve) insufficiency: Secondary | ICD-10-CM | POA: Diagnosis present

## 2020-05-31 DIAGNOSIS — I5043 Acute on chronic combined systolic (congestive) and diastolic (congestive) heart failure: Secondary | ICD-10-CM | POA: Diagnosis present

## 2020-05-31 DIAGNOSIS — Z83438 Family history of other disorder of lipoprotein metabolism and other lipidemia: Secondary | ICD-10-CM

## 2020-05-31 DIAGNOSIS — F32A Depression, unspecified: Secondary | ICD-10-CM | POA: Diagnosis present

## 2020-05-31 DIAGNOSIS — R627 Adult failure to thrive: Secondary | ICD-10-CM | POA: Diagnosis not present

## 2020-05-31 DIAGNOSIS — Z7189 Other specified counseling: Secondary | ICD-10-CM | POA: Diagnosis not present

## 2020-05-31 DIAGNOSIS — Z79899 Other long term (current) drug therapy: Secondary | ICD-10-CM

## 2020-05-31 DIAGNOSIS — K5901 Slow transit constipation: Secondary | ICD-10-CM | POA: Diagnosis not present

## 2020-05-31 DIAGNOSIS — I493 Ventricular premature depolarization: Secondary | ICD-10-CM | POA: Diagnosis not present

## 2020-05-31 DIAGNOSIS — D62 Acute posthemorrhagic anemia: Secondary | ICD-10-CM | POA: Diagnosis present

## 2020-05-31 DIAGNOSIS — I5082 Biventricular heart failure: Secondary | ICD-10-CM | POA: Diagnosis present

## 2020-05-31 DIAGNOSIS — R5381 Other malaise: Secondary | ICD-10-CM | POA: Diagnosis not present

## 2020-05-31 DIAGNOSIS — Z8249 Family history of ischemic heart disease and other diseases of the circulatory system: Secondary | ICD-10-CM

## 2020-05-31 DIAGNOSIS — J690 Pneumonitis due to inhalation of food and vomit: Secondary | ICD-10-CM | POA: Diagnosis not present

## 2020-05-31 DIAGNOSIS — I252 Old myocardial infarction: Secondary | ICD-10-CM

## 2020-05-31 DIAGNOSIS — I509 Heart failure, unspecified: Secondary | ICD-10-CM

## 2020-05-31 DIAGNOSIS — I700 Atherosclerosis of aorta: Secondary | ICD-10-CM | POA: Diagnosis present

## 2020-05-31 DIAGNOSIS — J9600 Acute respiratory failure, unspecified whether with hypoxia or hypercapnia: Secondary | ICD-10-CM

## 2020-05-31 DIAGNOSIS — Z86711 Personal history of pulmonary embolism: Secondary | ICD-10-CM

## 2020-05-31 DIAGNOSIS — I255 Ischemic cardiomyopathy: Secondary | ICD-10-CM | POA: Diagnosis present

## 2020-05-31 DIAGNOSIS — M3213 Lung involvement in systemic lupus erythematosus: Secondary | ICD-10-CM | POA: Diagnosis present

## 2020-05-31 DIAGNOSIS — I5022 Chronic systolic (congestive) heart failure: Secondary | ICD-10-CM | POA: Diagnosis not present

## 2020-05-31 LAB — PHOSPHORUS
Phosphorus: 5.2 mg/dL — ABNORMAL HIGH (ref 2.5–4.6)
Phosphorus: 5.2 mg/dL — ABNORMAL HIGH (ref 2.5–4.6)

## 2020-05-31 LAB — MAGNESIUM
Magnesium: 2.4 mg/dL (ref 1.7–2.4)
Magnesium: 2.3 mg/dL (ref 1.7–2.4)

## 2020-05-31 LAB — GLUCOSE, CAPILLARY
Glucose-Capillary: 113 mg/dL — ABNORMAL HIGH (ref 70–99)
Glucose-Capillary: 122 mg/dL — ABNORMAL HIGH (ref 70–99)

## 2020-05-31 LAB — LACTIC ACID, PLASMA
Lactic Acid, Venous: 5 mmol/L (ref 0.5–1.9)
Lactic Acid, Venous: 3.9 mmol/L (ref 0.5–1.9)
Lactic Acid, Venous: 5.6 mmol/L (ref 0.5–1.9)
Lactic Acid, Venous: 4.6 mmol/L (ref 0.5–1.9)

## 2020-05-31 LAB — COOXEMETRY PANEL
Carboxyhemoglobin: 1.9 % — ABNORMAL HIGH (ref 0.5–1.5)
Carboxyhemoglobin: 1.4 % (ref 0.5–1.5)
Carboxyhemoglobin: 1.2 % (ref 0.5–1.5)
Methemoglobin: 0.5 % (ref 0.0–1.5)
Methemoglobin: 0.8 % (ref 0.0–1.5)
Methemoglobin: 1 % (ref 0.0–1.5)
O2 Saturation: 59.6 %
O2 Saturation: 91.9 %
O2 Saturation: 85.7 %
Total hemoglobin: 9.8 g/dL — ABNORMAL LOW (ref 12.0–16.0)
Total hemoglobin: 9.7 g/dL — ABNORMAL LOW (ref 12.0–16.0)
Total hemoglobin: 10.4 g/dL — ABNORMAL LOW (ref 12.0–16.0)

## 2020-05-31 LAB — ECHOCARDIOGRAM LIMITED: Weight: 2059.98 oz

## 2020-05-31 LAB — CBC WITH DIFFERENTIAL/PLATELET
Abs Immature Granulocytes: 0.2 10*3/uL — ABNORMAL HIGH (ref 0.00–0.07)
Basophils Absolute: 0 10*3/uL (ref 0.0–0.1)
Basophils Relative: 0 %
Eosinophils Absolute: 0.2 10*3/uL (ref 0.0–0.5)
Eosinophils Relative: 2 %
HCT: 35.6 % — ABNORMAL LOW (ref 39.0–52.0)
Hemoglobin: 10.3 g/dL — ABNORMAL LOW (ref 13.0–17.0)
Lymphocytes Relative: 40 %
Lymphs Abs: 3.6 10*3/uL (ref 0.7–4.0)
MCH: 26.7 pg (ref 26.0–34.0)
MCHC: 28.9 g/dL — ABNORMAL LOW (ref 30.0–36.0)
MCV: 92.2 fL (ref 80.0–100.0)
Monocytes Absolute: 0.6 10*3/uL (ref 0.1–1.0)
Monocytes Relative: 7 %
Myelocytes: 2 %
Neutro Abs: 4.4 10*3/uL (ref 1.7–7.7)
Neutrophils Relative %: 49 %
Platelets: 332 10*3/uL (ref 150–400)
RBC: 3.86 MIL/uL — ABNORMAL LOW (ref 4.22–5.81)
RDW: 20.6 % — ABNORMAL HIGH (ref 11.5–15.5)
WBC: 9 10*3/uL (ref 4.0–10.5)
nRBC: 1 % — ABNORMAL HIGH (ref 0.0–0.2)
nRBC: 2 /100 WBC — ABNORMAL HIGH

## 2020-05-31 LAB — CBC
HCT: 34.3 % — ABNORMAL LOW (ref 39.0–52.0)
Hemoglobin: 10.2 g/dL — ABNORMAL LOW (ref 13.0–17.0)
MCH: 27.3 pg (ref 26.0–34.0)
MCHC: 29.7 g/dL — ABNORMAL LOW (ref 30.0–36.0)
MCV: 91.7 fL (ref 80.0–100.0)
Platelets: 318 10*3/uL (ref 150–400)
RBC: 3.74 MIL/uL — ABNORMAL LOW (ref 4.22–5.81)
RDW: 20.4 % — ABNORMAL HIGH (ref 11.5–15.5)
WBC: 12.2 10*3/uL — ABNORMAL HIGH (ref 4.0–10.5)
nRBC: 0.2 % (ref 0.0–0.2)

## 2020-05-31 LAB — BASIC METABOLIC PANEL
Anion gap: 9 (ref 5–15)
Anion gap: 16 — ABNORMAL HIGH (ref 5–15)
BUN: 33 mg/dL — ABNORMAL HIGH (ref 8–23)
BUN: 37 mg/dL — ABNORMAL HIGH (ref 8–23)
CO2: 31 mmol/L (ref 22–32)
CO2: 24 mmol/L (ref 22–32)
Calcium: 8.9 mg/dL (ref 8.9–10.3)
Calcium: 8.8 mg/dL — ABNORMAL LOW (ref 8.9–10.3)
Chloride: 100 mmol/L (ref 98–111)
Chloride: 98 mmol/L (ref 98–111)
Creatinine, Ser: 1.21 mg/dL (ref 0.61–1.24)
Creatinine, Ser: 1.63 mg/dL — ABNORMAL HIGH (ref 0.61–1.24)
GFR, Estimated: >60 mL/min (ref >60)
GFR, Estimated: 44 mL/min — ABNORMAL LOW (ref >60)
Glucose, Bld: 84 mg/dL (ref 70–99)
Glucose, Bld: 143 mg/dL — ABNORMAL HIGH (ref 70–99)
Potassium: 4.2 mmol/L (ref 3.5–5.1)
Potassium: 3.6 mmol/L (ref 3.5–5.1)
Sodium: 140 mmol/L (ref 135–145)
Sodium: 138 mmol/L (ref 135–145)

## 2020-05-31 LAB — POCT I-STAT 7, (LYTES, BLD GAS, ICA,H+H)
Acid-Base Excess: 5 mmol/L — ABNORMAL HIGH (ref 0.0–2.0)
Bicarbonate: 30.3 mmol/L — ABNORMAL HIGH (ref 20.0–28.0)
Calcium, Ion: 1.12 mmol/L — ABNORMAL LOW (ref 1.15–1.40)
HCT: 35 % — ABNORMAL LOW (ref 39.0–52.0)
Hemoglobin: 11.9 g/dL — ABNORMAL LOW (ref 13.0–17.0)
O2 Saturation: 100 %
Patient temperature: 98
Potassium: 3.5 mmol/L (ref 3.5–5.1)
Sodium: 138 mmol/L (ref 135–145)
TCO2: 32 mmol/L (ref 22–32)
pCO2 arterial: 44.1 mmHg (ref 32.0–48.0)
pH, Arterial: 7.444 (ref 7.350–7.450)
pO2, Arterial: 430 mmHg — ABNORMAL HIGH (ref 83.0–108.0)

## 2020-05-31 LAB — COMPREHENSIVE METABOLIC PANEL
ALT: 86 U/L — ABNORMAL HIGH (ref 0–44)
AST: 115 U/L — ABNORMAL HIGH (ref 15–41)
Albumin: 2.9 g/dL — ABNORMAL LOW (ref 3.5–5.0)
Alkaline Phosphatase: 72 U/L (ref 38–126)
Anion gap: 21 — ABNORMAL HIGH (ref 5–15)
BUN: 35 mg/dL — ABNORMAL HIGH (ref 8–23)
CO2: 21 mmol/L — ABNORMAL LOW (ref 22–32)
Calcium: 8.9 mg/dL (ref 8.9–10.3)
Chloride: 95 mmol/L — ABNORMAL LOW (ref 98–111)
Creatinine, Ser: 1.57 mg/dL — ABNORMAL HIGH (ref 0.61–1.24)
GFR, Estimated: 46 mL/min — ABNORMAL LOW (ref >60)
Glucose, Bld: 150 mg/dL — ABNORMAL HIGH (ref 70–99)
Potassium: 4 mmol/L (ref 3.5–5.1)
Sodium: 137 mmol/L (ref 135–145)
Total Bilirubin: 1.1 mg/dL (ref 0.3–1.2)
Total Protein: 7 g/dL (ref 6.5–8.1)

## 2020-05-31 LAB — FUNGUS CULTURE WITH STAIN

## 2020-05-31 LAB — FUNGUS CULTURE RESULT

## 2020-05-31 LAB — TSH: TSH: 7.218 u[IU]/mL — ABNORMAL HIGH (ref 0.350–4.500)

## 2020-05-31 LAB — TROPONIN I (HIGH SENSITIVITY)
Troponin I (High Sensitivity): 75 ng/L — ABNORMAL HIGH (ref <18)
Troponin I (High Sensitivity): 127 ng/L (ref <18)

## 2020-05-31 LAB — FUNGAL ORGANISM REFLEX

## 2020-05-31 LAB — BRAIN NATRIURETIC PEPTIDE: B Natriuretic Peptide: 1995.6 pg/mL — ABNORMAL HIGH (ref 0.0–100.0)

## 2020-05-31 MED ORDER — FENTANYL 2500MCG IN NS 250ML (10MCG/ML) PREMIX INFUSION
25.0000 ug/h | INTRAVENOUS | Status: DC
  Administered 2020-05-31: 16:00:00 200 ug/h via INTRAVENOUS
  Filled 2020-05-31: qty 250

## 2020-05-31 MED ORDER — FENTANYL 2500MCG IN NS 250ML (10MCG/ML) PREMIX INFUSION
25.0000 ug/h | INTRAVENOUS | Status: DC
  Administered 2020-05-31: 23:00:00 25 ug/h via INTRAVENOUS
  Administered 2020-06-01: 07:00:00 125 ug/h via INTRAVENOUS
  Filled 2020-05-31: qty 250

## 2020-05-31 MED ORDER — BUSPIRONE HCL 10 MG PO TABS: 30.0000 mg | ORAL_TABLET | Freq: Three times a day (TID) | ORAL | Status: DC

## 2020-05-31 MED ORDER — PROPOFOL 1000 MG/100ML IV EMUL
0.0000 ug/kg/min | INTRAVENOUS | Status: DC
  Filled 2020-05-31: qty 100

## 2020-05-31 MED ORDER — PROPOFOL 1000 MG/100ML IV EMUL
0.0000 ug/kg/min | INTRAVENOUS | Status: DC
  Administered 2020-05-31: 16:00:00 5 ug/kg/min via INTRAVENOUS
  Filled 2020-05-31: qty 100

## 2020-05-31 MED ORDER — FENTANYL CITRATE (PF) 100 MCG/2ML IJ SOLN
25.0000 ug | INTRAMUSCULAR | Status: DC | PRN
  Administered 2020-05-31: 25 ug via INTRAVENOUS
  Filled 2020-05-31 (×2): qty 2

## 2020-05-31 MED ORDER — PANTOPRAZOLE SODIUM 40 MG PO PACK
40.0000 mg | PACK | Freq: Every day | ORAL | Status: DC
  Administered 2020-05-31: 18:00:00 40 mg
  Filled 2020-05-31: qty 20

## 2020-05-31 MED ORDER — AMIODARONE HCL IN DEXTROSE 360-4.14 MG/200ML-% IV SOLN
60.0000 mg/h | INTRAVENOUS | Status: DC
  Filled 2020-05-31: qty 400

## 2020-05-31 MED ORDER — MIDAZOLAM HCL 2 MG/2ML IJ SOLN: 1.0000 mg | INTRAMUSCULAR | Status: DC | PRN

## 2020-05-31 MED ORDER — DIGOXIN 125 MCG PO TABS: 0.1250 mg | ORAL_TABLET | Freq: Every day | ORAL | Status: DC

## 2020-05-31 MED ORDER — SODIUM CHLORIDE 0.9% FLUSH
3.0000 mL | Freq: Two times a day (BID) | INTRAVENOUS | Status: DC
  Administered 2020-05-31 – 2020-06-06 (×9): 3 mL via INTRAVENOUS

## 2020-05-31 MED ORDER — VITAL HIGH PROTEIN PO LIQD
1000.0000 mL | ORAL | Status: DC
  Administered 2020-05-31: 18:00:00 1000 mL

## 2020-05-31 MED ORDER — POTASSIUM CHLORIDE 20 MEQ PO PACK
40.0000 meq | PACK | Freq: Once | ORAL | Status: AC
  Administered 2020-05-31: 22:00:00 40 meq
  Filled 2020-05-31: qty 2

## 2020-05-31 MED ORDER — NOREPINEPHRINE 16 MG/250ML-% IV SOLN
0.0000 ug/min | INTRAVENOUS | Status: DC
  Filled 2020-05-31: qty 250

## 2020-05-31 MED ORDER — HEPARIN (PORCINE) 25000 UT/250ML-% IV SOLN
700.0000 [IU]/h | INTRAVENOUS | Status: DC
  Administered 2020-06-01: 700 [IU]/h via INTRAVENOUS
  Filled 2020-05-31: qty 250

## 2020-05-31 MED ORDER — ACETAMINOPHEN 160 MG/5ML PO SOLN
650.0000 mg | ORAL | Status: DC
  Administered 2020-05-31 (×2): 650 mg
  Filled 2020-05-31 (×2): qty 20.3

## 2020-05-31 MED ORDER — AMIODARONE LOAD VIA INFUSION
150.0000 mg | Freq: Once | INTRAVENOUS | Status: DC
  Administered 2020-05-31: 20:00:00 150 mg via INTRAVENOUS
  Filled 2020-05-31: qty 83.34

## 2020-05-31 MED ORDER — AMIODARONE HCL IN DEXTROSE 360-4.14 MG/200ML-% IV SOLN: 30.0000 mg/h | INTRAVENOUS | Status: DC

## 2020-05-31 MED ORDER — MILRINONE LACTATE IN DEXTROSE 20-5 MG/100ML-% IV SOLN
0.2500 ug/kg/min | INTRAVENOUS | Status: DC
  Administered 2020-05-31 – 2020-06-02 (×2): 0.125 ug/kg/min via INTRAVENOUS
  Administered 2020-06-03 – 2020-06-09 (×4): 0.25 ug/kg/min via INTRAVENOUS
  Filled 2020-05-31 (×10): qty 100

## 2020-05-31 MED ORDER — ONDANSETRON HCL 4 MG/2ML IJ SOLN: 4.0000 mg | Freq: Four times a day (QID) | INTRAMUSCULAR | Status: DC | PRN

## 2020-05-31 MED ORDER — LIDOCAINE IN D5W 4-5 MG/ML-% IV SOLN
1.0000 mg/min | INTRAVENOUS | Status: DC
  Administered 2020-05-31: 21:00:00 1 mg/min via INTRAVENOUS
  Filled 2020-05-31: qty 500

## 2020-05-31 MED ORDER — FENTANYL CITRATE (PF) 100 MCG/2ML IJ SOLN: 25.0000 ug | Freq: Once | INTRAMUSCULAR | Status: DC

## 2020-05-31 MED ORDER — CHLORHEXIDINE GLUCONATE CLOTH 2 % EX PADS
6.0000 | MEDICATED_PAD | Freq: Every day | CUTANEOUS | Status: DC
  Administered 2020-05-31 – 2020-06-09 (×7): 6 via TOPICAL

## 2020-05-31 MED ORDER — FENTANYL CITRATE (PF) 100 MCG/2ML IJ SOLN: 25.0000 ug | INTRAMUSCULAR | Status: DC | PRN

## 2020-05-31 MED ORDER — SODIUM CHLORIDE 0.9 % IV SOLN
250.0000 mL | INTRAVENOUS | Status: DC | PRN
  Administered 2020-06-02 – 2020-06-04 (×2): 250 mL via INTRAVENOUS

## 2020-05-31 MED ORDER — ORAL CARE MOUTH RINSE
15.0000 mL | OROMUCOSAL | Status: DC
  Administered 2020-05-31 – 2020-06-01 (×7): 15 mL via OROMUCOSAL

## 2020-05-31 MED ORDER — FENTANYL BOLUS VIA INFUSION
25.0000 ug | INTRAVENOUS | Status: DC | PRN
  Filled 2020-05-31: qty 25

## 2020-05-31 MED ORDER — CHLORHEXIDINE GLUCONATE 0.12% ORAL RINSE (MEDLINE KIT)
15.0000 mL | Freq: Two times a day (BID) | OROMUCOSAL | Status: DC
  Administered 2020-05-31 – 2020-06-09 (×14): 15 mL via OROMUCOSAL

## 2020-05-31 MED ORDER — AMIODARONE HCL 200 MG PO TABS: 200.0000 mg | ORAL_TABLET | Freq: Every day | ORAL | Status: DC

## 2020-05-31 MED ORDER — POLYETHYLENE GLYCOL 3350 17 G PO PACK: 17.0000 g | PACK | Freq: Every day | ORAL | Status: DC

## 2020-05-31 MED ORDER — BUSPIRONE HCL 10 MG PO TABS
30.0000 mg | ORAL_TABLET | Freq: Three times a day (TID) | ORAL | Status: DC
  Administered 2020-05-31: 18:00:00 30 mg
  Filled 2020-05-31: qty 3

## 2020-05-31 MED ORDER — ACETAMINOPHEN 325 MG PO TABS
650.0000 mg | ORAL_TABLET | ORAL | Status: DC | PRN
  Administered 2020-06-03 – 2020-06-07 (×6): 650 mg via ORAL
  Filled 2020-05-31 (×6): qty 2

## 2020-05-31 MED ORDER — POTASSIUM CHLORIDE CRYS ER 20 MEQ PO TBCR: 40.0000 meq | EXTENDED_RELEASE_TABLET | Freq: Once | ORAL | Status: DC

## 2020-05-31 MED ORDER — ACETAMINOPHEN 325 MG PO TABS: 650.0000 mg | ORAL_TABLET | ORAL | Status: DC

## 2020-05-31 MED ORDER — SODIUM CHLORIDE 0.9% FLUSH: 3.0000 mL | INTRAVENOUS | Status: DC | PRN

## 2020-05-31 MED ORDER — ACETAMINOPHEN 650 MG RE SUPP: 650.0000 mg | RECTAL | Status: DC

## 2020-05-31 MED ORDER — DOCUSATE SODIUM 50 MG/5ML PO LIQD
100.0000 mg | Freq: Two times a day (BID) | ORAL | Status: DC
  Administered 2020-05-31: 22:00:00 100 mg
  Filled 2020-05-31: qty 10

## 2020-05-31 MED ORDER — SODIUM CHLORIDE 0.9% FLUSH: 10.0000 mL | INTRAVENOUS | Status: DC | PRN

## 2020-05-31 MED ORDER — POLYETHYLENE GLYCOL 3350 17 G PO PACK
17.0000 g | PACK | Freq: Two times a day (BID) | ORAL | Status: DC
  Administered 2020-05-31: 11:00:00 17 g via ORAL
  Filled 2020-05-31: qty 1

## 2020-05-31 MED ORDER — POLYETHYLENE GLYCOL 3350 17 G PO PACK
17.0000 g | PACK | Freq: Every day | ORAL | Status: DC
  Filled 2020-05-31: qty 1

## 2020-05-31 MED ORDER — PROSOURCE TF PO LIQD
45.0000 mL | Freq: Two times a day (BID) | ORAL | Status: DC
  Administered 2020-05-31: 22:00:00 45 mL
  Filled 2020-05-31: qty 45

## 2020-05-31 MED ORDER — DOCUSATE SODIUM 50 MG/5ML PO LIQD: 100.0000 mg | Freq: Two times a day (BID) | ORAL | Status: DC

## 2020-05-31 NOTE — Procedures (Addendum)
Patient Name: Cody Arellano  MRN: [id number]  Epilepsy Attending: Charlsie Quest  Referring Physician/Provider: Dr Lynnell Catalan Date: 05/05/2020  Duration: 24.52mins  Patient history: 74yo m s/p cardiac arrest. EEG to evaluate for seizure  Level of alertness:  Awake/lethargic  AEDs during EEG study: propofol  Technical aspects: This EEG study was done with scalp electrodes positioned according to the 10-20 International system of electrode placement. Electrical activity was acquired at a sampling rate of 500Hz  and reviewed with a high frequency filter of 70Hz  and a low frequency filter of 1Hz . EEG data were recorded continuously and digitally stored.   Description: No posterior dominant rhythm was seen. EEG showed continuous generalized 3 to 6 Hz theta-delta slowing. Hyperventilation and photic stimulation were not performed.     ABNORMALITY -Continuous slow, generalized  IMPRESSION: This study is suggestive of moderate diffuse encephalopathy, nonspecific etiology. No seizures or epileptiform discharges were seen throughout the recording.  Cody Arellano 

## 2020-05-31 NOTE — Progress Notes (Signed)
Nutrition Follow-up  DOCUMENTATION CODES:   Underweight,Severe malnutrition in context of chronic illness  INTERVENTION:   - Continue Ensure Enlive po TID, each supplement provides 350 kcal and 20 grams of protein  - Continue Magic Cup TID with meals, each supplement provides 290 kcal and 9 grams of protein  - ProSource Plus 30 ml po BID, each supplement provides 100 kcal and 15 grams of protein  - Continue MVI with minerals daily  NUTRITION DIAGNOSIS:   Severe Malnutrition related to chronic illness (CHF) as evidenced by severe fat depletion,severe muscle depletion,percent weight loss (22.9% weight loss in less than 7 months).  Ongoing  GOAL:   Patient will meet greater than or equal to 90% of their needs  Progressing  MONITOR:   PO intake,Supplement acceptance,Labs,Weight trends,Skin,I & O's  REASON FOR ASSESSMENT:   Malnutrition Screening Tool    ASSESSMENT:   74 year old male with PMH significant for lupus, interstitial lung disease, CAD s/p CABG in May 2021, CHF, PAF and recent. Presented 04/26/20 with increasing SOB x 2 weeks with associated nausea and vomiting. CT angiogram of the chest showed large right-sided pleural effusion and small left-sided pleural effusion. Pt underwent Impella insertion 05/03/20 with removal on 05/16/20.  Noted target d/c date of 12/30.  Spoke with pt and wife at bedside. RN and NT in room providing nursing care at time of RD visit.  Pt reports that overall he is eating better. He states that he drinks 2-3 Ensure supplements daily. He eats very well at breakfast and dinner but lunch is harder for him. Pt not eating the Magic Cups as frequently as he used to eat them.  Discussed with pt the importance of eating something at all mealtimes and not skipping meals. Encouraged pt to at least consume the Magic Cup at lunch meal. Pt expresses understanding.  Pt's wife concerned about pt's constipation. Encouraged adequate fluid and fiber  intake to aid in bowel regularity. All questions answered.  Admit weight: 61 kg Current weight: 58.4 kg  Meal Completion: 10-100%  Medications reviewed and include: ProSource Plus BID, farxiga, marinol 5 mg BID, Ensure Enlive TID, MVI with minerals, miralax, spironolactone, torsemide, milrinone  Labs reviewed: hemoglobin 9.3  UOP: 1075 ml x 24 hours  Diet Order:   Diet Order            Diet regular Room service appropriate? Yes with Assist; Fluid consistency: Thin  Diet effective now                 EDUCATION NEEDS:   Education needs have been addressed  Skin:  Skin Assessment: Skin Integrity Issues: Stage II: coccyx Incisions: right chest Other: skin tear to scrotum  Last BM:  05/30/20 type 1  Height:   Ht Readings from Last 1 Encounters:  05/24/20 6' (1.829 m)    Weight:   Wt Readings from Last 1 Encounters:  05/31/20 58.4 kg    Ideal Body Weight:  80.9 kg  BMI:  Body mass index is 17.46 kg/m.  Estimated Nutritional Needs:   Kcal:  2200-2400  Protein:  120-140 grams  Fluid:  >/= 2.0 L    Mertie Clause, MS, RD, LDN Inpatient Clinical Dietitian Please see AMiON for contact information.

## 2020-05-31 NOTE — Progress Notes (Signed)
  Echocardiogram 2D Echocardiogram has been performed.  Burnard Hawthorne 05/31/2020, 4:14 PM

## 2020-05-31 NOTE — Progress Notes (Addendum)
Patient ID: Cody Arellano, male   DOB: 02-04-1946, 74 y.o.   MRN: [id number] Met with pt and wife who is here for education to discuss team conference goals supervision level and target discharge date 12/30. Made aware HF MD have ordered a life vest and hopefully will be here prior to discharge home. Pt has all needed equipment and follow up via [place] for IV milirone. Continue to follow and work on discharge needs.  2:53 pm Switched home health agencies to [facility] due to [facility] does not do IV milirone

## 2020-05-31 NOTE — Progress Notes (Signed)
[facility] PHYSICAL MEDICINE & REHABILITATION PROGRESS NOTE   Subjective/Complaints: Patient seen laying in bed this morning.  He states he slept well overnight.  He is questions regarding discharge date.  He is about to work with therapies.  He states his shoulders are sore from working with therapies yesterday.  He was seen by heart failure team yesterday, notes reviewed-plan for LifeVest at DC.   ROS: Denies CP, SOB, N/V/D  Objective:   No results found. Recent Labs    05/30/20 0407  WBC 6.0  HGB 9.3*  HCT 31.7*  PLT 280   Recent Labs    05/29/20 0435 05/31/20 0358  NA 139 140  K 4.3 4.2  CL 98 100  CO2 31 31  GLUCOSE 98 84  BUN 36* 33*  CREATININE 1.28* 1.21  CALCIUM 8.7* 8.9    Intake/Output Summary (Last 24 hours) at 05/31/2020 0919 Last data filed at 05/31/2020 0700 Gross per 24 hour  Intake 633.96 ml  Output 1075 ml  Net -441.04 ml     Pressure Injury 05/23/20 Coccyx Lower;Mid Stage 2 -  Partial thickness loss of dermis presenting as a shallow open injury with a red, pink wound bed without slough. has a blister appearance. previously charted as skin tear. (Active)  05/23/20 1630  Location: Coccyx  Location Orientation: Lower;Mid  Staging: Stage 2 -  Partial thickness loss of dermis presenting as a shallow open injury with a red, pink wound bed without slough.  Wound Description (Comments): has a blister appearance. previously charted as skin tear.  Present on Admission: Yes    Physical Exam: Vital Signs Blood pressure 112/64, pulse (!) 57, temperature 97.9 F (36.6 C), temperature source Oral, resp. rate 17, height 6' (1.829 m), weight 58.4 kg, SpO2 100 %. Constitutional: No distress . Vital signs reviewed.  Cachectic. HENT: Normocephalic.  Atraumatic. Eyes: EOMI. No discharge. Cardiovascular: No JVD.  RRR. Respiratory: Normal effort.  No stridor.  Bilateral clear to auscultation. GI: Non-distended.  BS +. Skin: Warm and dry.  Intact. Psych:  Normal mood.  Normal behavior. Musc: No edema in extremities.  No tenderness in extremities. Neuro: Alert Motor: Grossly 4-4+/5 throughout, unchanged  Assessment/Plan: 1. Functional deficits which require 3+ hours per day of interdisciplinary therapy in a comprehensive inpatient rehab setting.  Physiatrist is providing close team supervision and 24 hour management of active medical problems listed below.  Physiatrist and rehab team continue to assess barriers to discharge/monitor patient progress toward functional and medical goals  Care Tool:  Bathing    Body parts bathed by patient: Right arm,Left arm,Chest,Abdomen,Face     Body parts n/a: Right upper leg,Left upper leg,Right lower leg,Front perineal area,Left lower leg,Buttocks (pt declined)   Bathing assist Assist Level: Supervision/Verbal cueing     Upper Body Dressing/Undressing Upper body dressing   What is the patient wearing?: Hospital gown only    Upper body assist Assist Level: Supervision/Verbal cueing    Lower Body Dressing/Undressing Lower body dressing      What is the patient wearing?: Pants,Incontinence brief     Lower body assist Assist for lower body dressing: Supervision/Verbal cueing     Toileting Toileting    Toileting assist Assist for toileting: Supervision/Verbal cueing Assistive Device Comment: urinal   Transfers Chair/bed transfer  Transfers assist     Chair/bed transfer assist level: Supervision/Verbal cueing     Locomotion Ambulation   Ambulation assist      Assist level: Contact Guard/Touching assist Assistive device: No Device Max  distance: 200'   Walk 10 feet activity   Assist     Assist level: Contact Guard/Touching assist Assistive device: No Device   Walk 50 feet activity   Assist    Assist level: Contact Guard/Touching assist Assistive device: No Device    Walk 150 feet activity   Assist    Assist level: Contact Guard/Touching  assist Assistive device: No Device    Walk 10 feet on uneven surface  activity   Assist Walk 10 feet on uneven surfaces activity did not occur: Safety/medical concerns         Wheelchair     Assist Will patient use wheelchair at discharge?: No Type of Wheelchair: Manual    Wheelchair assist level: Supervision/Verbal cueing Max wheelchair distance: 58'    Wheelchair 50 feet with 2 turns activity    Assist        Assist Level: Supervision/Verbal cueing   Wheelchair 150 feet activity     Assist      Assist Level: Minimal Assistance - Patient > 75%    Medical Problem List and Plan: 1.  Debility secondary to acute on chronic systolic congestive heart failure/cardiogenic shock/ischemic cardiomyopathy status post Impella insertion 05/03/2020 with removal of Impella assistive device 05/16/2020  PICC RUE for Milrinone at home  Continue CIR Filed Weights   05/28/20 1721 05/30/20 0651 05/31/20 0500  Weight: 59.6 kg 57.9 kg 58.4 kg   Plan for LifeVest at DC-patient at high risk for complications and mortality given ongoing milrinone fusion, need for LifeVest (pending), and simultaneous participation in CIR-monitor closely 2.  Antithrombotics: -DVT/anticoagulation: Eliquis             -antiplatelet therapy: N/A 3. Pain Management:   Main source of pain is sacral pressure injury- offload at least q2H to prevent pain and worsening of injury.   Controlled with meds on 12/28 4. Mood: Provide emotional support             -antipsychotic agents: N/A 5. Neuropsych: This patient is capable of making decisions on his own behalf. 6. Skin/Wound Care: Routine skin checks  Optimize nutrition 7. Fluids/Electrolytes/Nutrition: Routine in and outs 8.  Atrial fibrillation/ischemic cardiomyopathy/chronic systolic congestive heart failure/history of PE.  Status post DCCV 05/10/2020.    Continue Lanoxin 0.125 mg daily, amiodarone 200 mg  daily, ProAmatine 2.5 mg 3 times daily,  milrinone as directed, Aldactone 12.5 mg daily .  Followed closely by cardiology services.  Continue milrinone gtt- to go home on this- has PICC; discussed with patient  Controlled on 12/28 9.  SLE.  Imuran 100 mg twice daily, Plaquenil 400 mg nightly.  Follow-up Dr. Gavin Pound rheumatology services. 10.  Hyperlipidemia.    Crestor 11.  BPH.  Flomax 0.4 mg daily.    PVRs unremarkable 12. Poor appetite/Severe malnutrition  Marinol increased to 5 mg BID per pt request.   Variable p.o. intake 13.  Slow transit constipation- likely making appetite worse  Change senna S and colace to Senna S 2 po BID   MiraLAX twice daily added on 12/28  LOS: 8 days A FACE TO FACE EVALUATION WAS PERFORMED  Kyaire Gruenewald Lorie Phenix 05/31/2020, 9:19 AM

## 2020-05-31 NOTE — Progress Notes (Signed)
Suctioned patient and patient coded  CPR performed for 4 minutes and pulses regained.

## 2020-05-31 NOTE — Progress Notes (Signed)
eLink Physician-Brief Progress Note Patient Name: Cody Arellano DOB: 27-Apr-1946 MRN: [id number]   Date of Service  05/31/2020  HPI/Events of Note  Notified of need for DVT prophylaxis.   eICU Interventions  Will order SCD's to bilateral lower extremities.      Intervention Category Intermediate Interventions: Best-practice therapies (e.g. DVT, beta blocker, etc.)  Lydiah Pong Eugene 05/31/2020, 11:01 PM

## 2020-05-31 NOTE — Progress Notes (Signed)
Occupational Therapy Session Note  Patient Details  Name: Cody Arellano MRN: [id number] Date of Birth: 1945-07-16  Today's Date: 05/31/2020 OT Individual Time: 0923-1006 OT Individual Time Calculation (min): 43 min    Short Term Goals: Week 1:  OT Short Term Goal 1 (Week 1): STGs=LTGs due to ELOS  Skilled Therapeutic Interventions/Progress Updates:    Pt received side lying in bed, agreeable to therapy, but reports fatigue. Session focus on improving activity tolerance. Completes bed mobility with distant S. Sit > stand, amb to ortho gym with RW + close S. Completed Nustep at following levels for improved activity tolerance: Resistance level 2 for 4 min, reports 6/10 on RPE Resistance level 4 at 2.5 min, req rest break of 2-3 minutes.  Transitioned to putting, stood for total of ~15 min to putt golf balls with CGA for balance + no AD. Req one seated rest break half way through. Amb back to room and completed bed mobility with above assist levels. Reports 7/10 on RPE at end of session. States he has no additional concerns going home.  Pt left side lying in bed with bed alarm engaged, call bell in reach, all immediate needs met, wife present.    Therapy Documentation Precautions:  Precautions Precautions: Fall Precaution Comments: skin integrity- pressure sore on buttocks, restricted RUE Restrictions Weight Bearing Restrictions: No RUE Weight Bearing: Weight bearing as tolerated Other Position/Activity Restrictions: R impella Pain: denies   ADL: See Care Tool for more details.  Therapy/Group: Individual Therapy  Volanda Napoleon, MS, OTR/L  05/31/2020, 9:16 AM

## 2020-05-31 NOTE — Progress Notes (Signed)
Physical Therapy Session Note  Patient Details  Name: Cody Arellano MRN: [id number] Date of Birth: 1946-04-26  Today's Date: 05/31/2020 PT Individual Time: 0800-0850 PT Individual Time Calculation (min): 50 min   Short Term Goals: Week 1:  PT Short Term Goal 1 (Week 1): pt to demonstrate supine<>sit mod I PT Short Term Goal 2 (Week 1): pt to demonstrate functional transfers CGA PT Short Term Goal 3 (Week 1): pt to demonstrate ambulation with LRAD at community distances at [facility] PT Short Term Goal 4 (Week 1): pt to demonstrate dynamic standing balance at [facility]  Skilled Therapeutic Interventions/Progress Updates:    Patient received sitting up in bed, agreeable to PT. He denies pain. RN at bedside proving AM rx. He was able to come to sit edge of bed with supervision. Ambulated ~160ft to gym with RW and CGA. PT assist with IV pole management. Upon sitting in gym, HR 78BPM, O2 100% on RA. He was able to complete standing toe taps onto 6" box with no UE support, close supervision, 3x20 for improved endurance. Patient completing TUG in 17.5s with no AD, which is slower than age predicted norm indicating increased risk for falls. However, this time is significantly reduced since admission. Patient scored a 48/56 on the Berg Balance scale indicating minimal risk for falling. Discussed findings with patient and given medical hx and current rx, patient remains fall risk. He verbalized understanding. Patient notes 3" step to enter house. He was able to negotiate 3" steps x4 with no HR and CGA. Patient transferring into car with supervision and ambulating back to his room with RW and supervision. Patient returning to bed, bed alarm on, call light within reach.   Therapy Documentation Precautions:  Precautions Precautions: Fall Precaution Comments: skin integrity- pressure sore on buttocks, restricted RUE Restrictions Weight Bearing Restrictions: No RUE Weight Bearing: Weight bearing as tolerated Other  Position/Activity Restrictions: R impella   Therapy/Group: Individual Therapy  Elizebeth Koller, PT, DPT, CBIS  05/31/2020, 7:31 AM

## 2020-05-31 NOTE — Progress Notes (Signed)
   05/31/20 1450  Clinical Encounter Type  Visited With Family  Visit Type Code  Referral From Nurse  Consult/Referral To Chaplain  Spiritual Encounters  Spiritual Needs Emotional;Prayer  The chaplain responded to code blue. The patient's wife was present. The chaplain spoke with the wife and provided emotional and social support. The chaplain offered prayer. The chaplain walked with wife to the main entrance. The patient's wife was going to talk to her children to give them an update in person and will return to the hospital once her husband of 43 years is settled on 2H. The chaplain will follow up as needed.

## 2020-05-31 NOTE — Progress Notes (Deleted)
Occupational Therapy Session Note  Patient Details  Name: Cody Arellano MRN: [id number] Date of Birth: 1946/02/22  Today's Date: 05/31/2020 OT Individual Time: 8295-6213 OT Individual Time Calculation (min): 47 min  and Today's Date: 05/31/2020 OT Missed Time: 28 Minutes Missed Time Reason: Nursing care   Short Term Goals: Week 1:    Week 2:     Skilled Therapeutic Interventions/Progress Updates:    Pt greeted at time of session supine in bed resting with wife present for family education session, IV running and pt reported 2/10 on fatigue scale. Agreeable to ADL for wife supervision and training, bed mobility supine to sit Supervision and ambulated to sink level in same manner. Doffed clothing with CGA/close supervision with cues for sequencing sitting/standing and pt ed on energy conservation throughout session. UB bathing set up and LB bathing supervision/CGA with standing at sink level for periarea and buttocks. Pt did need to use the bathroom, ambulated no AD with assist to manage IV pole to/from bathroom and urinated in standing. Ambulated back to wheelchair in same manner, Cardiology entered room to inspect sutures. OT stepping out of patient's room with wife, discussed techniques to keep the pt motivated at home to participate in therapy, decrease time in bed, and techniques to prevent bed sores. When re-entered the room, pt found unresponsive in wheelchair despite sternal rub with labored breathing and distress call initiated. Dependent transfer x3 wheelchair > bed. Hand off to nursing staff.   Therapy Documentation Precautions:  Fall risk, monitor BP    Therapy/Group: Individual Therapy  [name] 05/31/2020, 4:44 PM

## 2020-05-31 NOTE — Progress Notes (Signed)
  Patient Name: Cody Arellano   MRN: [id number]   Date of Birth/ Sex: 06-09-1945 , male      Admission Date: 05/31/2020  Attending Provider: Laurey Morale, MD  Primary Diagnosis: <principal problem not specified>   Indication: Pt was in his usual state of health until this PM, when he was noted to be in ventricular tachycardia, unresponsive. Code blue was subsequently called. At the time of arrival on scene, ACLS protocol was underway.   Technical Description:  - CPR performance duration:  4 minutes  - Was defibrillation or cardioversion used? Yes   - Was external pacer placed? Yes  - Was patient intubated pre/post CPR? Yes   Medications Administered: Y = Yes; Blank = No Amiodarone    Atropine    Calcium    Epinephrine  Y  Lidocaine    Magnesium    Norepinephrine    Phenylephrine    Sodium bicarbonate    Vasopressin     Post CPR evaluation:  - Final Status - Was patient successfully resuscitated ? Yes - What is current rhythm? Sinus rhythm - What is current hemodynamic status? stable  Miscellaneous Information:  - Labs sent, including: CBC, BMP, Mg, phos, CXR, lactic acid  - Primary team notified?  Yes  - Family Notified? No  - Additional notes/ transfer status: ICU APP Joneen Roach at bedside as well. Dr. Shirlee Latch paged as well. Family to be notified per RN.     Evlyn Kanner, MD  05/31/2020, 8:03 PM  [phone]

## 2020-05-31 NOTE — Progress Notes (Addendum)
Advanced Heart Failure Rounding Note  PCP-Cardiologist: Mertie Moores, MD   Subjective:    Discharged to CIR 12/20  Yesterday midodrine stopped.   On milrinone 0.125. Co-ox 59% .   Denies SOB. Wants to go home .     Objective:   Weight Range: 58.4 kg Body mass index is 17.46 kg/m.   Vital Signs:   Temp:  [97.5 F (36.4 C)-98.4 F (36.9 C)] 97.7 F (36.5 C) (12/28 1200) Pulse Rate:  [54-59] 59 (12/28 1200) Resp:  [16-18] 17 (12/28 1200) BP: (111-118)/(60-70) 118/69 (12/28 1200) SpO2:  [97 %-100 %] 100 % (12/28 0811) Weight:  [58.4 kg] 58.4 kg (12/28 0500) Last BM Date: 05/28/20  Weight change: Filed Weights   05/28/20 1721 05/30/20 0651 05/31/20 0500  Weight: 59.6 kg 57.9 kg 58.4 kg    Intake/Output:   Intake/Output Summary (Last 24 hours) at 05/31/2020 1411 Last data filed at 05/31/2020 1200 Gross per 24 hour  Intake 603.96 ml  Output 1575 ml  Net -971.04 ml      Physical Exam   General:  No resp difficulty HEENT: normal Neck: supple. no JVD. Carotids 2+ bilat; no bruits. No lymphadenopathy or thryomegaly appreciated. Cor: PMI nondisplaced. Regular rate & rhythm. No rubs, gallops or murmurs. Lungs: clear Abdomen: soft, nontender, nondistended. No hepatosplenomegaly. No bruits or masses. Good bowel sounds. Extremities: no cyanosis, clubbing, rash, edema. RUE PICC  Neuro: alert & orientedx3, cranial nerves grossly intact. moves all 4 extremities w/o difficulty. Affect pleasant   Labs    CBC Recent Labs    05/30/20 0407  WBC 6.0  NEUTROABS 3.7  HGB 9.3*  HCT 31.7*  MCV 92.7  PLT 983   Basic Metabolic Panel Recent Labs    05/29/20 0435 05/31/20 0358  NA 139 140  K 4.3 4.2  CL 98 100  CO2 31 31  GLUCOSE 98 84  BUN 36* 33*  CREATININE 1.28* 1.21  CALCIUM 8.7* 8.9   Liver Function Tests No results for input(s): AST, ALT, ALKPHOS, BILITOT, PROT, ALBUMIN in the last 72 hours. No results for input(s): LIPASE, AMYLASE in the last  72 hours. Cardiac Enzymes No results for input(s): CKTOTAL, CKMB, CKMBINDEX, TROPONINI in the last 72 hours.  BNP: BNP (last 3 results) Recent Labs    04/06/20 2134 04/26/20 1319 04/30/20 0248  BNP 1,677.6* 1,824.4* 1,685.5*    ProBNP (last 3 results) No results for input(s): PROBNP in the last 8760 hours.   D-Dimer No results for input(s): DDIMER in the last 72 hours. Hemoglobin A1C No results for input(s): HGBA1C in the last 72 hours. Fasting Lipid Panel No results for input(s): CHOL, HDL, LDLCALC, TRIG, CHOLHDL, LDLDIRECT in the last 72 hours. Thyroid Function Tests No results for input(s): TSH, T4TOTAL, T3FREE, THYROIDAB in the last 72 hours.  Invalid input(s): FREET3  Other results:   Imaging    No results found.   Medications:     Scheduled Medications: . (feeding supplement) PROSource Plus  30 mL Oral BID BM  . amiodarone  200 mg Oral Daily  . apixaban  5 mg Oral BID  . aspirin EC  81 mg Oral Daily  . azaTHIOprine  100 mg Oral BID  . Chlorhexidine Gluconate Cloth  6 each Topical BID  . dapagliflozin propanediol  10 mg Oral Daily  . digoxin  0.125 mg Oral Daily  . dronabinol  5 mg Oral BID AC  . feeding supplement  237 mL Oral TID BM  . hydroxychloroquine  400 mg Oral QHS  . multivitamin with minerals  1 tablet Oral Daily  . polyethylene glycol  17 g Oral BID  . polyvinyl alcohol  2 drop Both Eyes Daily  . rosuvastatin  20 mg Oral Daily  . senna-docusate  2 tablet Oral BID  . sodium chloride flush  10-40 mL Intracatheter Q12H  . spironolactone  25 mg Oral Daily  . tamsulosin  0.4 mg Oral Daily  . torsemide  20 mg Oral Daily    Infusions: . milrinone 0.125 mcg/kg/min (05/30/20 1500)    PRN Medications: acetaminophen, bisacodyl, ondansetron, sodium chloride flush, white petrolatum    Assessment/Plan   1. 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  Echo this admission looks worse than 10/21 with EF 20-25%  with septal akinesis, mid to apical inferior akinesis, apical anterior and apex akinesis, moderately dilated and dysfunctional RV, moderate TR, severe MR, bicuspid aortic valve with no stenosis, mild aortic insufficiency. Progressive/end-stage CHF worsened by onset of atrial fibrillation. Cardiogenic shock 11/30 with co-ox down to 30%, Impella 5.5 placed to allow time to stabilize and consider further steps.  Impella extracted 12/13. 0.125 milrinone added post explant for marginal co-ox at 51%. Remains on milrinone 0.125. Co-ox 49% today.  He has end-stage biventricular HF. He is not a transplant candidate.  Barriers to LVAD include nutritional status/deconditioning and RV.  I think RV would be acceptable. We discussed in [place], not LVAD candidate at this point with malnutrition/deconditioning. He is now in [place] for aggressive PT. Nutritional status improving. Will continue on milrinone as bridge to VAD to allow time to improve nutritional status and mobility. - On milrinone 0.125 mcg/kg/min. Co-ox stable 59%  - Volume status stable.  - No bb with low output.  - Continue torsemide 20 daily.  - Off losartan with low BP.  - Continue Spiro 25 mg daily  - Continue digoxin 0.125.   - Continue Farxiga 10 mg  2. CAD: Prior history of MI with LAD and ramus PCI. AdmittedMay 2021with anterior STEMI. LHC with occluded ostial LAD (in-stent), 90% ostial ramus, 60-70% proximal LCx, nondominant RCA. PTCA to LAD and ramus to restore flow, then CABG with LIMA-LAD and SVG-ramus.  No chest pain, doubt ACS.  Fall in EF from 10/21 to 11/21, but he did not present with ACS.  [place] 12/1 showed patient LIMA-LAD, patent native LAD, patent dominant LCx.  The SVG-ramus is occluded with severe diffuse in-stent restenosis in proximal ramus.  Ramus not intervened upon as this would be a complex intervention and unlikely to markedly improve EF.  - No chest pain.  - Continue ASA 81 and statin.    3. AKI: Creatinine now trending down  with improved cardiac output. Renal function normalized.   4. Right pleural effusion: S/p thoracentesis, transudative (CHF).  5. Atrial fibrillation/flutter with RVR: DCCV on 12/3, went back to NSR 12/7.  Regular on exam.  - Continue amiodarone 200 mg once daily.  - Continue apixaban 5 mg bid. 6. Mitral regurgitation:  TEE 12/3 with moderate central MR.  Doubt MitraClip will help him much with moderate MR and he is likely too advanced.  7. H/o PE: In 10/21.  Keep anticoagulated, on apixaban. No bleeding  8. Bicuspid aortic valve: Mild AI, no AS.  9. SLE: H/o pericarditis.  On Imuran and hydroxychloroquine.  - Back on home SLE regimen  10. Pulmonary nodules/emphysema: CCM following, s/p bronch/BAL (no growth).  11. ID: Concern for right-sided PNA, ?aspiration.  Cultures NGTD. Completed  course of vancomycin/cefepime.   - no change 12. Thrombocytopenia: Resolved.  May have been due to critical illness, low grade hemolysis with Impella. LDH ok.  HIT negative.  - Back on apixaban.  - Resolved.  13. Hypokalemia/hypomag: Supplement as needed. 14. Acute blood loss anemia: Hgb stable at 9.3.  15. VT: Episode early am 12/7, required DCCV.   16. Deconditioning: -  Making progress in CIR. Continue therapy plan.  -  Nutritional status much improved. Prealbumin 30  [facility] aware of d/c for tomorrow. Life Vest will be delivered tonight.  HF follow up has been set up.   Farxiga 10 mg daily Amio 200 mg daily  Apixaban 5 mg twice a day  Digoxin 0.125 mg daily  Milrinone 0.125 mcg Torsemide 20 mg daily Spironolactone 25 mg daily  Crestor 20 mg daily.   Length of Stay: [place], NP  05/31/2020, 2:11 PM  Advanced Heart Failure Team Pager [phone] (M-F; 7a - 4p)  Please contact [facility] for night-coverage after hours (4p -7a ) and weekends on [email or web address]  Patient seen with NP, agree with the above note.    Patient had been doing very well with PT and was stable, planned for discharge  tomorrow.  However, after OT this afternoon he passed out and was found to be asystolic.  CPR was initiated and he received 1 mg epinephrine IV.  He promptly regained his pulse.  SBP up to 130s.  He was intubated and transferred to [facility].   General: Intubated Neck: No JVD, no thyromegaly or thyroid nodule.  Lungs: Clear to auscultation bilaterally with normal respiratory effort. CV: Nondisplaced PMI.  Heart regular S1/S2, no S3/S4, no murmur.  No peripheral edema.   Abdomen: Soft, nontender, no hepatosplenomegaly, no distention.  Skin: Intact without lesions or rashes.  Neurologic: Not responsive Extremities: No clubbing or cyanosis.  HEENT: Normal.   Cause of asystole uncertain.  Prior to event, no dyspnea or chest pain. No significant lab abnormalities today.  - Send HS-TnI - Repeat labs.  - Stat echo r/o pericardial effusion.  - He will need head CT to rule out head bleed, not responsive currently on vent.   He has been on apixaban for PAF, will continue amiodarone and use heparin gtt if CT negative for bleed.   Resend co-ox and continue milrinone 0.125.   Needs CXR.   "Aggressive normothermia."   CRITICAL CARE Performed by: Loralie Champagne  Total critical care time: 40 minutes  Critical care time was exclusive of separately billable procedures and treating other patients.  Critical care was necessary to treat or prevent imminent or life-threatening deterioration.  Critical care was time spent personally by me on the following activities: development of treatment plan with patient and/or surrogate as well as nursing, discussions with consultants, evaluation of patient's response to treatment, examination of patient, obtaining history from patient or surrogate, ordering and performing treatments and interventions, ordering and review of laboratory studies, ordering and review of radiographic studies, pulse oximetry and re-evaluation of patient's condition.  Loralie Champagne 05/31/2020 3:23 PM

## 2020-05-31 NOTE — Progress Notes (Signed)
Patient transported for CT scan and returned to [facility] room 13 with no incidents to report.

## 2020-05-31 NOTE — Discharge Summary (Signed)
Physician Discharge Summary  Patient ID: Cody Arellano MRN: [id number] DOB/AGE: 1946/05/07 74 y.o.  Admit date: 05/23/2020 Discharge date: 06/01/2019  Discharge Diagnoses:  Principal Problem:   Debility Active Problems:   Goals of care, counseling/discussion   Chronic systolic CHF (congestive heart failure) (HCC)   Pressure injury of skin   Palliative care by specialist   Slow transit constipation   Benign prostatic hyperplasia   Sacral pain Cardiogenic shock Ischemic cardiomyopathy Atrial fibrillation SLE Hyperlipidemia BPH  Discharged Condition: Stable  Significant Diagnostic Studies: DG Chest 1 View  Result Date: 05/03/2020 CLINICAL DATA:  Impella device placement. EXAM: CHEST  1 VIEW COMPARISON:  Chest x-ray 04/30/2020. FINDINGS: Impella device noted over the mid chest. One image obtained. 3 minutes 10 seconds fluoroscopy. 19.0 mGy. IMPRESSION: Impella device noted over the mid chest. Electronically Signed   By: Marcello Moores  Register   On: 05/03/2020 11:48   CARDIAC CATHETERIZATION  Result Date: 05/04/2020 1. Dominant LCx, no significant disease in this vessel. 2. 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  The LIMA-LAD is also present. 3. The SVG-ramus has been occluded.  There is severe and diffuse in-stent restenosis in the proximal ramus. Could potentially intervene on the ramus, but this would be a complex procedure and I do not think this explains the profound fall in his EF and cardiogenic shock.  Would defer intervention at this time, next step will be DCCV.   DG Chest Port 1 View  Result Date: 05/16/2020 CLINICAL DATA:  History of prior LVAD EXAM: PORTABLE CHEST 1 VIEW COMPARISON:  05/12/2020 FINDINGS: Cardiac shadow is enlarged. Postsurgical changes are again seen. Swan-Ganz catheter is noted in the pulmonary outflow tract. Feeding catheter is noted with the tip in the stomach although coiled upon itself within esophagus new from the prior exam.  Previously seen Impella catheter has been removed. Right-sided PICC line remains in satisfactory position. Persistent central vascular congestion is noted. No focal infiltrate is seen. IMPRESSION: Interval removal of Impella device. Feeding catheter is coiled upon itself within the mid esophagus. Persistent vascular congestion without significant edema. Electronically Signed   By: Inez Catalina M.D.   On: 05/16/2020 14:29   DG CHEST PORT 1 VIEW  Result Date: 05/12/2020 CLINICAL DATA:  Sore chest left ventricular assist device. EXAM: PORTABLE CHEST 1 VIEW COMPARISON:  05/07/2020. FINDINGS: Feeding tube, Swan-Ganz catheter, right PICC line, Impella device in stable position. Stable cardiomegaly. Pulmonary venous congestion. Bilateral interstitial prominence improved from prior exam suggesting improving interstitial edema. Small right pleural effusion. Right costophrenic angle incompletely imaged. No pneumothorax. Surgical clips noted over the right chest. IMPRESSION: 1. Lines and tubes in stable position. 2. Impella device in stable position. Cardiomegaly with pulmonary venous congestion. Bilateral interstitial prominence improved from prior exam suggesting improving interstitial edema. Small right pleural effusion. Electronically Signed   By: Marcello Moores  Register   On: 05/12/2020 07:40   DG CHEST PORT 1 VIEW  Result Date: 05/07/2020 CLINICAL DATA:  Follow-up pleural effusion EXAM: PORTABLE CHEST 1 VIEW COMPARISON:  05/05/2020 FINDINGS: Cardiac shadow is enlarged. Postsurgical changes are noted. Impella catheter, Swan-Ganz catheter and feeding catheter are noted in satisfactory position. No endotracheal tube is seen. Patchy airspace opacity is noted throughout the right lung and left lung base with small pleural effusions. No bony abnormality is noted. Right-sided PICC line is noted at the cavoatrial junction. IMPRESSION: Tubes and lines as described above. Airspace opacities bilaterally with right worse than left.  Small effusions are noted. Electronically Signed  By: Alcide Clever M.D.   On: 05/07/2020 09:02   DG CHEST PORT 1 VIEW  Result Date: 05/05/2020 CLINICAL DATA:  Fibrosis.  Atelectasis. EXAM: PORTABLE CHEST 1 VIEW COMPARISON:  May 04, 2020. FINDINGS: Stable cardiomediastinal silhouette. Endotracheal and nasogastric tubes are unchanged in position. Impella device is unchanged. Right internal jugular Swan-Ganz catheter is noted with tip in expected position of main pulmonary artery. No pneumothorax or pleural effusion is noted. Stable bibasilar subsegmental atelectasis is noted. Bony thorax is unremarkable. IMPRESSION: Stable support apparatus. Stable bibasilar subsegmental atelectasis. No pneumothorax is noted. Electronically Signed   By: Lupita Raider M.D.   On: 05/05/2020 08:22   DG CHEST PORT 1 VIEW  Result Date: 05/04/2020 CLINICAL DATA:  Hypoxia EXAM: PORTABLE CHEST 1 VIEW COMPARISON:  May 03, 2020 FINDINGS: Endotracheal tube tip is 4.3 cm above the carina. Nasogastric tube tip and side port are below the diaphragm. Central catheter tip is at the origin of the right main pulmonary artery. There is an Impella device present, unchanged in position. No pneumothorax. There has been significant clearing of airspace opacity on the right and to a lesser extent on the left compared to 1 day prior. There is fibrosis in the lung bases. There is a small right pleural effusion with bibasilar atelectasis. Heart is mildly enlarged with pulmonary vascularity normal. There is a left atrial appendage clamp. No adenopathy. There is aortic atherosclerosis. There is thoracic dextroscoliosis IMPRESSION: Tube and catheter positions as described without pneumothorax. Significant interval clearing of airspace opacity on the right as well as milder clearing on the left. No new opacity evident. Probable fibrosis and atelectasis in the lung bases. No well-defined airspace consolidation currently. Stable cardiomegaly.  Aortic Atherosclerosis (ICD10-I70.0). Electronically Signed   By: Bretta Bang III M.D.   On: 05/04/2020 08:19   DG CHEST PORT 1 VIEW  Result Date: 05/03/2020 CLINICAL DATA:  Status post central line placement. EXAM: PORTABLE CHEST 1 VIEW COMPARISON:  Single-view of the chest 04/30/2020. FINDINGS: Endotracheal tube is in place with the tip in good position just below the clavicular heads. Impella device projects in good position. Right IJ approach Swan-Ganz catheter tip is in the proximal right main pulmonary artery. No pneumothorax. Airspace disease throughout the right chest is much worse than on the prior study. Patchy airspace opacity in left lung base is unchanged. Heart size is normal. IMPRESSION: Support tubes and lines project in good position.  No pneumothorax. Marked worsening of airspace disease throughout the right chest most worrisome for pneumonia. Patchy airspace disease in left lung base is unchanged. Electronically Signed   By: Drusilla Kanner M.D.   On: 05/03/2020 13:20   DG Abd Portable 1V  Result Date: 05/06/2020 CLINICAL DATA:  Feeding tube placement EXAM: PORTABLE ABDOMEN - 1 VIEW COMPARISON:  None. FINDINGS: The feeding tube terminates in the stomach. IMPRESSION: The feeding tube terminates in the stomach and should be repositioned into the distal duodenum near the ligament of Treitz before use. Electronically Signed   By: Gerome Sam III M.D   On: 05/06/2020 18:57   DG Abd Portable 1V  Result Date: 05/03/2020 CLINICAL DATA:  Orogastric tube placement. EXAM: PORTABLE ABDOMEN - 1 VIEW COMPARISON:  None. FINDINGS: An orogastric tube is seen with tip overlying the proximal stomach. No evidence of dilated bowel loops. IMPRESSION: Orogastric tube tip overlies the proximal stomach. Electronically Signed   By: Danae Orleans M.D.   On: 05/03/2020 15:18   DG C-Arm 1-60 Min  Result Date: 05/03/2020 CLINICAL DATA:  Impella device placement. EXAM: DG C-ARM 1-60 MIN CONTRAST:   None. FLUOROSCOPY TIME:  Fluoroscopy Time:  3 minutes 10 seconds Radiation Exposure Index (if provided by the fluoroscopic device): 19.0 mGy Number of Acquired Spot Images: 1 COMPARISON:  Chest x-ray 04/30/2020. FINDINGS: Single view obtained.  Impella device noted over the mid chest. IMPRESSION: Impella device noted over the mid chest. Electronically Signed   By: Marcello Moores  Register   On: 05/03/2020 11:49   ECHO TEE  Result Date: 05/12/2020    TRANSESOPHOGEAL ECHO REPORT   [name] Date of Exam: 05/06/2020 Medical Rec #:  [id number]    Height:       72.0 in Accession #:    [id number]   Weight:       141.8 lb Date of Birth:  Jun 07, 1945    BSA:          1.841 m Patient Age:    10 years     BP:           105/75 mmHg Patient Gender: M            HR:           101 bpm. Exam Location:  Inpatient Procedure: Transesophageal Echo, Cardiac Doppler, Color Doppler and 3D Echo Indications:     Atrial flutter 427.32 / I48.92  History:         Patient has prior history of Echocardiogram examinations, most                  recent 05/05/2020. CHF, CAD and Previous Myocardial Infarction,                  Prior CABG, Signs/Symptoms:Shortness of Breath; Risk                  Factors:Hypertension, Dyslipidemia and Former Smoker.  Sonographer:     Vickie Epley RDCS Referring Phys:  [id number] AMY D CLEGG Diagnosing Phys: Loralie Champagne MD  Sonographer Comments: 200mg  of etomidate. PROCEDURE: The transesophogeal probe was passed without difficulty through the esophogus of the patient. Sedation performed by different physician. The patient was monitored while under deep sedation. The patient developed no complications during the procedure. IMPRESSIONS  1. Left ventricular ejection fraction, by estimation, is 20%. The left ventricle has severely decreased function. The left ventricle demonstrates global hypokinesis. The left ventricular internal cavity size was mildly dilated. Impella catheter in place, tip 4.1 cm from aortic  valve.  2. Right ventricular systolic function is moderately reduced. The right ventricular size is mildly enlarged.  3. Left atrial size was moderately dilated. The left atrial appendage has been surgically clipped. No thrombus in the left atrium.  4. Right atrial size was moderately dilated.  5. No PFO or ASD by color doppler.  6. Moderate central mitral regurgitation, suspect functional. PISA ERO only [id number] cm^2, no systolic flow reversal in pulmonary vein doppler pattern. No evidence of mitral stenosis.  7. The aortic valve may be bicuspid, hard to tell as Impella catheter enters across valve.  8. Normal caliber thoracic aorta with mild plaque. FINDINGS  Left Ventricle: Left ventricular ejection fraction, by estimation, is 20%. The left ventricle has severely decreased function. The left ventricle demonstrates global hypokinesis. The left ventricular internal cavity size was mildly dilated. Right Ventricle: The right ventricular size is mildly enlarged. No increase in right ventricular wall thickness. Right ventricular systolic function is moderately reduced. Left Atrium:  Left atrial size was moderately dilated. No left atrial/left atrial appendage thrombus was detected. Right Atrium: Right atrial size was moderately dilated. Pericardium: There is no evidence of pericardial effusion. Mitral Valve: Moderate central mitral regurgitation, suspect functional. PISA ERO only [id number] cm^2, no systolic flow reversal in pulmonary vein doppler pattern. The mitral valve is abnormal. Moderate mitral valve regurgitation. No evidence of mitral valve stenosis. Tricuspid Valve: The tricuspid valve is normal in structure. Tricuspid valve regurgitation is mild. Aortic Valve: The aortic valve may be bicuspid, hard to tell as Impella catheter enters across valve. The aortic valve is abnormal. Aortic valve regurgitation is trivial. Pulmonic Valve: The pulmonic valve was normal in structure. Pulmonic valve regurgitation is not  visualized. Aorta: Normal caliber thoracic aorta with mild plaque. The aortic root is normal in size and structure. IAS/Shunts: No PFO or ASD by color doppler.  MR Peak grad:    100.8 mmHg MR Mean grad:    60.0 mmHg MR Vmax:         502.00 cm/s MR Vmean:        355.0 cm/s MR PISA:         2.26 cm MR PISA Eff ROA: 17 mm MR PISA Radius:  0.60 cm Loralie Champagne MD Electronically signed by Loralie Champagne MD Signature Date/Time: 05/12/2020/2:12:53 PM    Final    ECHO INTRAOPERATIVE TEE  Result Date: 05/20/2020  *INTRAOPERATIVE TRANSESOPHAGEAL REPORT *  [name] Date of Exam: 05/16/2020 Medical Rec #:  [id number]    Height:       72.0 in Accession #:    [id number]   Weight:       140.9 lb Date of Birth:  09-Jun-1945    BSA:          1.84 m Patient Age:    58 years     BP:           110/50 mmHg Patient Gender: M            HR:           62 bpm. Exam Location:  Inpatient Transesophogeal exam was perform intraoperatively during surgical procedure. Patient was closely monitored under general anesthesia during the entirety of examination. Indications:     Congestive Heart Failure Performing Phys: Rochele Pages Diagnosing Phys: Belenda Cruise Stoltzfus Complications: No known complications during this procedure. POST-OP IMPRESSIONS - Left Ventricle: The left ventricle is unchanged from pre-bypass. - Aorta: The aorta appears unchanged from pre-bypass. - Left Atrial Appendage: The left atrial appendage appears unchanged from pre-bypass. - Aortic Valve: The aortic valve appears unchanged from pre-bypass. - Mitral Valve: The mitral valve appears unchanged from pre-bypass. - Tricuspid Valve: The tricuspid valve appears unchanged from pre-bypass. - Interatrial Septum: The interatrial septum appears unchanged from pre-bypass. - Pericardium: The pericardium appears unchanged from pre-bypass. - Comments: Interval removal of Impella 5.5 catheter without incident. No new or worsening wall motion or valvular  abnormalities. EF post procedure 25-30% PRE-OP FINDINGS  Left Ventricular Assist Device: Impella 5.5 in proper position ~5cm into LV. Left Ventricle: The left ventricle has severely reduced systolic function, with an ejection fraction of 25-30%. The cavity size was moderately dilated. There is no increase in left ventricular wall thickness. Left ventricular diffuse hypokinesis. The average left ventricular global longitudinal strain is -6.7 %. Right Ventricle: The right ventricle has severely reduced systolic function. The cavity was moderately enlarged. There is no increase in right ventricular wall thickness. Right ventricular systolic  pressure is moderately elevated. Left Atrium: Left atrial size was dilated. The left atrial appendage has been surgically closed and the repair appears to be normal. Right Atrium: Right atrial size was dilated. Interatrial Septum: No atrial level shunt detected by color flow Doppler. Pericardium: There is no evidence of pericardial effusion. There is no pleural effusion. Mitral Valve: The mitral valve is normal in structure. No thickening of the mitral valve leaflet. No calcification of the mitral valve leaflet. Mitral valve regurgitation is mild by color flow Doppler. The MR jet is centrally-directed. There is no evidence of mitral valve vegetation. There is No evidence of mitral stenosis. Tricuspid Valve: The tricuspid valve was normal in structure. Tricuspid valve regurgitation is mild-moderate by color flow Doppler. The jet is directed centrally. There is no evidence of tricuspid valve vegetation. Aortic Valve: The aortic valve is bicuspid There is mild thickening of the aortic valve and There is mild calcification of the aortic valve Aortic valve regurgitation is trivial by color flow Doppler. There is no evidence of aortic valve stenosis. There is no evidence of a vegetation on the aortic valve. Pulmonic Valve: The pulmonic valve was normal in structure No evidence of pumonic  stenosis. Pulmonic valve regurgitation is trivial by color flow Doppler. Aorta: The ascending aorta and aortic root are normal in size and structure. Pulmonary Artery: The pulmonary artery is mildly dilated. Venous: The inferior vena cava is dilated in size with less than 50% respiratory variability, suggesting right atrial pressure of 15 mmHg. Shunts: No residual ventricular septal defect is detected by 2D or color Doppler. +--------------+--------++ LEFT VENTRICLE         +----------------+---------++ +--------------+--------++ Diastology                PLAX 2D                +----------------+---------++ +--------------+--------++ LV e' lateral:  6.17 cm/s LVIDd:        5.75 cm  +----------------+---------++ +--------------+--------++ LV E/e' lateral:13.9      LVIDs:        5.02 cm  +----------------+---------++ +--------------+--------++ LV e' medial:   9.05 cm/s LV IVS:       1.22 cm  +----------------+---------++ +--------------+--------++ LV E/e' medial: 9.5       LVOT diam:    2.00 cm  +----------------+---------++ +--------------+--------++ LV SV:        44 ml    +----------------------+------++ +--------------+--------++ 2D Longitudinal Strain       LV SV Index:  24.55    +----------------------+------++ +--------------+--------++ 2D Strain GLS (A2C):  -6.4 % LVOT Area:    3.14 cm +----------------------+------++ +--------------+--------++ 2D Strain GLS (A3C):  -6.1 %                        +----------------------+------++ +--------------+--------++ 2D Strain GLS (A4C):  -7.8 %                            +----------------------+------++                            2D Strain GLS Avg:    -6.7 %                            +----------------------+------++ +------------------+-----------++ AORTIC VALVE                  +------------------+-----------++  AV Area (Vmax):   1.10 cm    +------------------+-----------++ AV Area  (Vmean):  1.07 cm    +------------------+-----------++ AV Area (VTI):    0.10 cm    +------------------+-----------++ AV Vmax:          154.00 cm/s +------------------+-----------++ AV Vmean:         94.000 cm/s +------------------+-----------++ AV VTI:           2.480 m     +------------------+-----------++ AV Peak Grad:     9.5 mmHg    +------------------+-----------++ AV Mean Grad:     4.0 mmHg    +------------------+-----------++ LVOT Vmax:        54.00 cm/s  +------------------+-----------++ LVOT Vmean:       32.000 cm/s +------------------+-----------++ LVOT VTI:         0.080 m     +------------------+-----------++ LVOT/AV VTI ratio:0.03        +------------------+-----------++  +--------------+-------++ AORTA                 +--------------+-------++ Ao Sinus diam:3.63 cm +--------------+-------++ Ao STJ diam:  2.8 cm  +--------------+-------++ Ao Asc diam:  3.10 cm +--------------+-------++ Ao Desc diam: 2.60 cm +--------------+-------++ +--------------+----------++ MITRAL VALVE             +--------------+-------+ +--------------+----------++ SHUNTS                MV Area (PHT):5.97 cm   +--------------+-------+ +--------------+----------++ Systemic VTI: 0.08 m  MV Peak grad: 3.3 mmHg   +--------------+-------+ +--------------+----------++ Systemic Diam:2.00 cm MV Mean grad: 1.0 mmHg   +--------------+-------+ +--------------+----------++ MV Vmax:      0.91 m/s   +--------------+----------++ MV Vmean:     35.0 cm/s  +--------------+----------++ MV PHT:       36.83 msec +--------------+----------++ MV Decel Time:127 msec   +--------------+----------++ +--------------+----------++ MV E velocity:85.80 cm/s +--------------+----------++ MV A velocity:30.00 cm/s +--------------+----------++ MV E/A ratio: 2.86       +--------------+----------++  Rochele Pages Electronically signed by Rochele Pages Signature Date/Time: 05/20/2020/11:00:49 AM    Final    ECHOCARDIOGRAM LIMITED  Result Date: 05/27/2020    ECHOCARDIOGRAM LIMITED REPORT   [name] Date of Exam: 05/27/2020 Medical Rec #:  [id number]    Height:       72.0 in Accession #:    [id number]   Weight:       134.5 lb Date of Birth:  [birth year]    BSA:          1.800 m Patient Age:    81 years     BP:           119/69 mmHg Patient Gender: M            HR:           58 bpm. Exam Location:  Inpatient Procedure: Limited Echo, Cardiac Doppler and Color Doppler Indications:     Aortic regurgitation  History:         Patient has prior history of Echocardiogram examinations, most                  recent 05/16/2020. CHF and Cardiomyopathy, Aortic Valve                  Disease; Arrythmias:Atrial Fibrillation. Post Impella removal.  Sonographer:     Dustin Flock Referring Phys:  [id number] Alfredo Spong R BENSIMHON Diagnosing Phys: Carlyle Dolly MD IMPRESSIONS  1. Limited echo. The mid to distal anteroseptal,anterior walls are akinetic  The apex is akinetic. The mid to distal anterolateral wall is hypokinetic. Left ventricular ejection fraction, by estimation, is 20 to 25%. The left ventricle has severely decreased function. The left ventricle demonstrates regional wall motion abnormalities (see scoring diagram/findings for description).  2. Right ventricular systolic function is normal. The right ventricular size is mildly enlarged.  3. Moderate mitral valve regurgitation. No evidence of mitral stenosis.  4. The aortic valve is tricuspid. There is mild calcification of the aortic valve. There is mild thickening of the aortic valve. Aortic valve regurgitation is mild to moderate. No aortic stenosis is present. FINDINGS  Left Ventricle: Limited echo. The mid to distal anteroseptal,anterior walls are akinetic The apex is akinetic. The mid to distal anterolateral wall is hypokinetic. Left ventricular ejection fraction, by estimation, is 20 to  25%. The left ventricle has severely decreased function. The left ventricle demonstrates regional wall motion abnormalities. Right Ventricle: The right ventricular size is mildly enlarged. No increase in right ventricular wall thickness. Right ventricular systolic function is normal. Mitral Valve: There is mild thickening of the mitral valve leaflet(s). There is mild calcification of the mitral valve leaflet(s). Mild mitral annular calcification. Moderate mitral valve regurgitation. No evidence of mitral valve stenosis. Tricuspid Valve: The tricuspid valve is not assessed. Aortic Valve: The aortic valve is tricuspid. There is mild calcification of the aortic valve. There is mild thickening of the aortic valve. There is mild aortic valve annular calcification. Aortic valve regurgitation is mild to moderate. Aortic regurgitation PHT measures 573 msec. No aortic stenosis is present. Aortic valve mean gradient measures 9.0 mmHg. Aortic valve peak gradient measures 16.6 mmHg. Aortic valve area, by VTI measures 2.49 cm. Pulmonic Valve: The pulmonic valve was not well visualized. Pulmonic valve regurgitation is mild. No evidence of pulmonic stenosis. LEFT VENTRICLE PLAX 2D LVIDd:         5.70 cm LVIDs:         4.70 cm LV PW:         1.10 cm LV IVS:        0.90 cm LVOT diam:     2.40 cm LV SV:         90 LV SV Index:   50 LVOT Area:     4.52 cm  AORTIC VALVE AV Area (Vmax):    2.29 cm AV Area (Vmean):   1.89 cm AV Area (VTI):     2.49 cm AV Vmax:           203.50 cm/s AV Vmean:          144.000 cm/s AV VTI:            0.363 m AV Peak Grad:      16.6 mmHg AV Mean Grad:      9.0 mmHg LVOT Vmax:         103.00 cm/s LVOT Vmean:        60.200 cm/s LVOT VTI:          0.200 m LVOT/AV VTI ratio: 0.55 AI PHT:            573 msec  SHUNTS Systemic VTI:  0.20 m Systemic Diam: 2.40 cm Carlyle Dolly MD Electronically signed by Carlyle Dolly MD Signature Date/Time: 05/27/2020/12:16:13 PM    Final (Updated)    ECHOCARDIOGRAM  LIMITED  Result Date: 05/12/2020    ECHOCARDIOGRAM LIMITED REPORT   [name] Date of Exam: 05/12/2020 Medical Rec #:  [id number]  Height:       72.0 in Accession #:    [id number]   Weight:       156.1 lb Date of Birth:  1946/01/27    BSA:          1.918 m Patient Age:    74 years     BP:           00/00 mmHg Patient Gender: M            HR:           53 bpm. Exam Location:  Inpatient Procedure: Limited Echo Indications:    CHF  History:        Patient has prior history of Echocardiogram examinations, most                 recent 05/10/2020. CHF and Cardiomyopathy, Aortic Valve Disease                 and bicuspid AoV; Arrythmias:Atrial Fibrillation. Impella.  Sonographer:    Lavenia Atlas Referring Phys: [phone] DALTON S MCLEAN IMPRESSIONS  1. Left ventricular ejection fraction, by estimation, is 20 to 25%. The left ventricle has severely decreased function. The left ventricle demonstrates global hypokinesis with relative preservation of the lateral wall. Impella catheter is in reasonable position at 4.6 cm.  2. Right ventricular systolic function is moderately reduced. The right ventricular size is moderately enlarged.  3. Left atrial size was mildly dilated.  4. Right atrial size was mildly dilated. FINDINGS  Left Ventricle: Left ventricular ejection fraction, by estimation, is 20 to 25%. The left ventricle has severely decreased function. The left ventricle demonstrates global hypokinesis. The left ventricular internal cavity size was normal in size. There is no left ventricular hypertrophy. Right Ventricle: The right ventricular size is moderately enlarged. Right ventricular systolic function is moderately reduced. Left Atrium: Left atrial size was mildly dilated. Right Atrium: Right atrial size was mildly dilated. Marca Ancona MD Electronically signed by Marca Ancona MD Signature Date/Time: 05/12/2020/1:51:48 PM    Final    ECHOCARDIOGRAM LIMITED  Result Date: 05/10/2020     ECHOCARDIOGRAM LIMITED REPORT   [name] Date of Exam: 05/10/2020 Medical Rec #:  [id number]    Height:       72.0 in Accession #:    [id number]   Weight:       153.2 lb Date of Birth:  07-Mar-1946    BSA:          1.902 m Patient Age:    74 years     BP:           121/75 mmHg Patient Gender: M            HR:           44 bpm. Exam Location:  Inpatient Procedure: Limited Echo, Color Doppler and Cardiac Doppler Indications:    Impella Position  History:        Patient has prior history of Echocardiogram examinations, most                 recent 05/09/2020. CHF, CAD, Arrythmias:Atrial Fibrillation; Risk                 Factors:Dyslipidemia and Lupus.  Sonographer:    Irving Burton Senior RDCS Referring Phys: [phone] DALTON S MCLEAN IMPRESSIONS  1. Left ventricular thrombus is not seen. Impella catheter position is appropriate. Left ventricular ejection fraction, by estimation, is <20%. The left  ventricle has severely decreased function. The left ventricle demonstrates regional wall motion abnormalities (see scoring diagram/findings for description). Regional wall motion suggests infarction/scar in the LAD artery and right coronary artery distribution, with mild apical dyskinesis. The areas of myocardium that exhibit akinesis/dyskinesis are also thin, suggestive of low likelihood of viability.  2. Right ventricular systolic function is moderately reduced. The right ventricular size is moderately enlarged. There is mildly elevated pulmonary artery systolic pressure. The estimated right ventricular systolic pressure is [id number] mmHg.  3. Left atrial size was severely dilated.  4. Right atrial size was severely dilated.  5. The mitral valve was not interrogated with color Doppler. The mitral valve is grossly normal. No evidence of mitral stenosis.  6. Tricuspid valve regurgitation is severe.  7. The aortic valve was not interrogated with color Doppler. The aortic valve is grossly normal. No aortic stenosis is present.   8. The inferior vena cava is dilated in size with <50% respiratory variability, suggesting right atrial pressure of 15 mmHg. Comparison(s): No significant change from prior study. Prior images reviewed side by side. The Impella position appears more stable. FINDINGS  Left Ventricle: Left ventricular thrombus is not seen. Impella catheter position is appropriate. Left ventricular ejection fraction, by estimation, is <20%. The left ventricle has severely decreased function. The left ventricle demonstrates regional wall motion abnormalities.  LV Wall Scoring: The apical septal segment, apical anterior segment, and apex are dyskinetic. The mid and distal inferior wall, mid anteroseptal segment, apical lateral segment, and mid inferoseptal segment are akinetic. The basal anteroseptal segment, mid inferolateral segment, mid anterior segment, and basal inferior segment are hypokinetic. The antero-lateral wall, basal inferolateral segment, basal anterior segment, and basal inferoseptal segment are normal. Regional wall motion suggests infarction/scar in the LAD artery and right coronary artery distribution, with mild apical dyskinesis. Right Ventricle: The right ventricular size is moderately enlarged. No increase in right ventricular wall thickness. Right ventricular systolic function is moderately reduced. There is mildly elevated pulmonary artery systolic pressure. The tricuspid regurgitant velocity is 2.70 m/s, and with an assumed right atrial pressure of 15 mmHg, the estimated right ventricular systolic pressure is 99991111 mmHg. Left Atrium: Left atrial size was severely dilated. Right Atrium: Right atrial size was severely dilated. Pericardium: There is no evidence of pericardial effusion. Mitral Valve: The mitral valve was not interrogated with color Doppler. The mitral valve is grossly normal. No evidence of mitral valve stenosis. Tricuspid Valve: Tricuspid valve regurgitation is severe. Aortic Valve: The aortic valve  was not interrogated with color Doppler. The aortic valve is grossly normal. No aortic stenosis is present. Pulmonic Valve: The pulmonic valve was not assessed. Aorta: The aortic root is normal in size and structure. Venous: The inferior vena cava is dilated in size with less than 50% respiratory variability, suggesting right atrial pressure of 15 mmHg. IAS/Shunts: The interatrial septum was not assessed. Additional Comments: A venous catheter is visualized. RIGHT VENTRICLE RV S prime:     9.68 cm/s TRICUSPID VALVE TR Peak grad:   29.2 mmHg TR Vmax:        270.00 cm/s Sanda Klein MD Electronically signed by Sanda Klein MD Signature Date/Time: 05/10/2020/10:40:54 AM    Final    ECHOCARDIOGRAM LIMITED  Result Date: 05/09/2020    ECHOCARDIOGRAM LIMITED REPORT   [name] Date of Exam: 05/09/2020 Medical Rec #:  [id number]    Height:       72.0 in Accession #:    [id number]  Weight:       152.3 lb Date of Birth:  04-15-1946    BSA:          1.898 m Patient Age:    39 years     BP:           122/65 mmHg Patient Gender: M            HR:           58 bpm. Exam Location:  Inpatient Procedure: Limited Echo Indications:    I50.23 Acute on chronic systolic (congestive) heart failure  History:        Patient has prior history of Echocardiogram examinations, most                 recent 05/06/2020. CAD and Previous Myocardial Infarction, Prior                 CABG, Signs/Symptoms:Dyspnea; Risk Factors:Hypertension and                 Dyslipidemia. Lupus.  Sonographer:    Jonelle Sidle Dance Referring Phys: [place]  1. Limited study for impella placement. The basal to mid inferolateral/lateral segments are severely hypokinetic. All other segments are akinetic. The impella tip is 4.9-5.0 cm from the AoV. Left ventricular ejection fraction, by estimation, is 15-20%. The left ventricle has severely decreased function. The left ventricle demonstrates global hypokinesis.  2. Right  ventricular systolic function is moderately reduced. The right ventricular size is moderately enlarged.  3. Left atrial size was mildly dilated.  4. Right atrial size was mildly dilated. Comparison(s): Changes from prior study are noted. RV now moderately dilated with moderately reduced function. Impella in likely acceptable position EF remains severely reduced. FINDINGS  Left Ventricle: Limited study for impella placement. The basal to mid inferolateral/lateral segments are severely hypokinetic. All other segments are akinetic. The impella tip is 4.9-5.0 cm from the AoV. Left ventricular ejection fraction, by estimation, is 15-20%. The left ventricle has severely decreased function. The left ventricle demonstrates global hypokinesis. Right Ventricle: The right ventricular size is moderately enlarged. Right ventricular systolic function is moderately reduced. Left Atrium: Left atrial size was mildly dilated. Right Atrium: Right atrial size was mildly dilated. Pericardium: Trivial pericardial effusion is present. Additional Comments: A venous catheter is visualized in the right ventricle. There is a small pleural effusion in the left lateral region. Eleonore Chiquito MD Electronically signed by Eleonore Chiquito MD Signature Date/Time: 05/09/2020/11:38:58 AM    Final    ECHOCARDIOGRAM LIMITED  Result Date: 05/04/2020    ECHOCARDIOGRAM LIMITED REPORT   [name] Date of Exam: 05/04/2020 Medical Rec #:  [id number]    Height:       72.0 in Accession #:    [id number]   Weight:       138.4 lb Date of Birth:  December 09, 1945    BSA:          1.822 m Patient Age:    52 years     BP:           75/63 mmHg Patient Gender: M            HR:           111 bpm. Exam Location:  Inpatient Procedure: Limited Echo and Limited Color Doppler Indications:    I50.23 Acute on chronic systolic (congestive) heart failure  History:        Patient has prior history  of Echocardiogram examinations, most                 recent 05/03/2020.  Previous Myocardial Infarction and CAD, Prior                 CABG, Signs/Symptoms:Dyspnea; Risk Factors:Diabetes and                 Dyslipidemia. Lupus.  Sonographer:    Jonelle Sidle Dance Referring Phys: [place]  1. Left ventricular ejection fraction, by estimation, is 20 to 25%. The left ventricle has severely decreased function. The left ventricle demonstrates global hypokinesis.  2. Right ventricular systolic function is moderately reduced. The right ventricular size is mildly enlarged.  3. The mitral valve is grossly normal.  4. The aortic valve was not well visualized. Comparison(s): Compared to prior studies, interval change in LV Support Device and improvement in tricuspid regurgitation. FINDINGS  Left Ventricle: LV support device has moved to 4.0 cm from aortic annulus. Left ventricular ejection fraction, by estimation, is 20 to 25%. The left ventricle has severely decreased function. The left ventricle demonstrates global hypokinesis. Right Ventricle: The right ventricular size is mildly enlarged. Right vetricular wall thickness was not well visualized. Right ventricular systolic function is moderately reduced. Right Atrium: Right atrial size was normal in size. Mitral Valve: The mitral valve is grossly normal. Tricuspid Valve: The tricuspid valve is grossly normal. Tricuspid valve regurgitation is not demonstrated. Aortic Valve: The aortic valve was not well visualized. Additional Comments: A venous catheter is visualized. RIGHT VENTRICLE RV Basal diam:  2.80 cm RIGHT ATRIUM           Index RA Area:     13.60 cm RA Volume:   34.80 ml  19.10 ml/m Rudean Haskell MD Electronically signed by Rudean Haskell MD Signature Date/Time: 05/04/2020/12:36:49 PM    Final    ECHOCARDIOGRAM LIMITED  Result Date: 05/03/2020    ECHOCARDIOGRAM LIMITED REPORT   Patient Name:   EDISON COLVARD Dues Date of Exam: 05/03/2020 Medical Rec #:  [id number]    Height:       72.0 in Accession #:     [id number]   Weight:       132.5 lb Date of Birth:  09/02/1945    BSA:          1.789 m Patient Age:    53 years     BP:           0/0 mmHg Patient Gender: M            HR:           95 bpm. Exam Location:  Inpatient Procedure: Limited Echo STAT ECHO Indications:    impella placement check  History:        Patient has prior history of Echocardiogram examinations, most                 recent 05/03/2020. CHF, CAD; Risk Factors:Dyslipidemia.  Sonographer:    Johny Chess Referring Phys: [place]  Conclusion(s)/Recommendation(s): Limited study for impella placement. Measured at 4.52 cm from aortic annulus. LVEF reduced, RV enlarged. IVC dilated. Buford Dresser MD Electronically signed by Buford Dresser MD Signature Date/Time: 05/03/2020/8:05:08 PM    Final     Labs:  Basic Metabolic Panel: Recent Labs  Lab 05/24/20 0615 05/25/20 0321 05/27/20 0413 05/29/20 0435 05/31/20 0358  NA 138 138 139 139 140  K 4.2 4.1 4.2 4.3 4.2  CL 97* 96*  96* 98 100  CO2 32 32 32 31 31  GLUCOSE 89 114* 107* 98 84  BUN 25* 37* 39* 36* 33*  CREATININE 1.12 1.17 1.23 1.28* 1.21  CALCIUM 8.7* 8.3* 8.6* 8.7* 8.9    CBC: Recent Labs  Lab 05/24/20 0615 05/30/20 0407  WBC 5.8 6.0  NEUTROABS 3.3 3.7  HGB 8.9* 9.3*  HCT 30.1* 31.7*  MCV 94.4 92.7  PLT 308 280    CBG: No results for input(s): GLUCAP in the last 168 hours.  Family history.  Mother with CAD.  Father with alcohol abuse Sister with hyperlipidemia and hypertension.  Brother with questionable lung cancer.  Negative for colon cancer esophageal cancer rectal cancer  Brief HPI:   Cody Arellano is a 74 y.o. right-handed male with history significant for lupus diagnosed 2014 maintained on Imuran as well as Plaquenil followed by Dr. Gavin Pound, interstitial lung disease, CAD with CABG May [id number] chronic systolic congestive heart failure PAF on Eliquis recent PE followed by cardiology services.  Patient lives with spouse  independent prior to admission two-level home bed and bath upstairs.  Family with good support.  Presented 1123 with increasing shortness of breath x2 weeks with associated nausea and intermittent nonbilious nonbloody vomiting approximate 2-3 times per week.  Shortness of breath worsens with exertion and states could barely walk 10 feet without becoming short of breath and also reports 20 pound unintentional weight loss since May 2021.  Patient was recently admitted 4/21 due to acute on chronic systolic congestive heart failure.  In the ED patient was tachypneic creatinine 1.45 elevated BNP 1824.  Echocardiogram with ejection fraction of 20 to [id number] grade 3 diastolic dysfunction with septal akinesis as well as biventricular dysfunction along with severe mitral regurgitation.  CT of the chest with contrast showed poor opacification of bilateral lower lobe pulmonary arteries.  CT angiogram of the chest showed large right side pleural effusion.  There was a small left-sided pleural effusion.  Patient did receive IV Lasix as well as placed on intravenous heparin followed by cardiology services as well as initiation of milrinone.  Critical care pulmonary services Dr. Valeta Harms consulted underwent flexible video fiberoptic bronchoscopy 04/29/2020 with biopsies unremarkable.  Follow-up CVTS with noted concern for low cardiac output and echocardiogram worsening since recent study patient underwent Impella insertion 05/03/2020 per Dr. 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  Hospital course underwent DCCV 05/10/2020 due to ongoing V. tach converted back to atrial fibrillation.  Later went into junctional rhythm amiodarone adjusted TEE completed showing mildly dilated LV ejection fraction 20% mildly elevated RV with moderately decreased systolic function moderate MR.  Patient did remain in normal sinus rhythm after cardiac adjustments.  Acute on chronic anemia 8.9.  Renal function stabilized  1.04.  Palliative care had been consulted to establish goals of care.  Due to patient generalized decline debility related to CHF multimedical he was admitted for a comprehensive rehab program   Hospital Course: Cody Arellano was admitted to rehab 05/23/2020 for inpatient therapies to consist of PT, ST and OT at least three hours five days a week. Past admission physiatrist, therapy team and rehab RN have worked together to provide customized collaborative inpatient rehab.  Pertaining to patient's debility related to acute on chronic systolic congestive heart failure with cardiogenic shock ischemic cardiomyopathy status post Impella insertion 05/03/2020 with removal of Impella assistive device 05/16/2020.  Patient remained followed by cardiology services.  PICC line had been placed right upper  extremity for milrinone which would be used at home.  He remained on Eliquis no bleeding episodes.  Lanoxin amiodarone Primatene as directed with adjustments accordingly he was fitted with a LifeVest.  Noted history of SLE Imuran Plaquenil was advised he would follow-up rheumatology service with Dr. Lenna Gilford.  Crestor for hyperlipidemia.  BPH with Flomax as directed.  Patient did have a small stage II pressure injury to the sacrum routine turning and Mepilex in place changed every 3 days as needed   Blood pressures were monitored on TID basis and soft and monitored     Rehab course: During patient's stay in rehab weekly team conferences were held to monitor patient's progress, set goals and discuss barriers to discharge. At admission, patient required minimal guard 490 feet rolling walker minimal assist sit to stand minimal assist side-lying to sitting.  Moderate assist upper body bathing max is lower body bathing max is set by dressing max is lower body dressing  Physical exam.  Blood pressure 98/53 pulse 57 temperature 97.9 respirations 18 oxygen saturation 9 9% room air Constitutional.  Alert and oriented no  apparent distress HEENT Head.  Normocephalic and atraumatic Neck.  Supple nontender no JVD without thyromegaly Cardiac regular rate and rhythm positive murmur Respiratory effort normal no respiratory distress without wheeze Abdomen.  Soft nontender positive bowel sounds without rebound Extremities.  No clubbing cyanosis or edema pulses 2+ Neurologic.  Cognitively appropriate normal insight memory and awareness.  Cranial nerves II through XII intact.  Motor strength 4/5  He/  has had improvement in activity tolerance, balance, postural control as well as ability to compensate for deficits. He/ has had improvement in functional use RUE/LUE  and RLE/LLE as well as improvement in awareness.  Working with energy conservation techniques.  Supervision supine to sit assist to don shoes in sitting.  Ambulates on level pay services 300 feet contact-guard assist.  He can ambulate to the gift shop.  Supine to sit supervision ambulates into the bathroom contact-guard assist.  Walk back to his bed same manner.  Full family teaching completed plan discharge to home.  On the afternoon of 05/31/2020 after family teaching and follow-up or cardiology services patient CODE BLUE cardiac arrest.  CPR was initiated patient was transferred to acute care end of service cardiology services.       Disposition: Guarded       30-35 minutes were spent completing discharge summary and discharge planning     Follow-up Information    Lovorn, Jinny Blossom, MD Follow up.   Specialty: Physical Medicine and Rehabilitation Why: Only as needed Contact information: [id number] [place] [address] [phone]        [name], MD Follow up.   Specialty: Cardiothoracic Surgery Why: Call for appointment Contact information: [place] [address] [phone]        June Leap L, DO Follow up.   Specialty: Pulmonary Disease Why: Call for appointment Contact  information: [address] [place] [address] [phone]        [facility] Follow up on 06/16/2020.   Specialty: Cardiology Why: 08:30 Garage Code [id number] Contact information: [address] [id number] [place] [phone]              Signed: Lavon Paganini Leland 05/31/2020, 5:15 AM

## 2020-05-31 NOTE — Progress Notes (Signed)
EEG completed, results pending. 

## 2020-05-31 NOTE — Progress Notes (Signed)
  Spoke with [employer] rep. Life Vest will be delivered today.   Zanden Colver NP-C  2:10 PM

## 2020-05-31 NOTE — H&P (Addendum)
Advanced Heart Failure Team History and Physical Note   PCP:  Venia Carbon, MD  PCP-Cardiology: Mertie Moores, MD     Reason for Admission: Post unwitnessed arrest, asytole   HPI:   74 y/o male with complicated PMHx including SLE, COPD, CAD s/p previous PCIs.   First admitted earlier this year in 5/21 with anterior STEMI found to have thrombotic occlusion of ostial LAD stent + 90% ISR in ostial ramus with EF 25-30%. L dominant system. Underwent PTCA of LAD lesion with reduction 100% ->50% however unable to stent ramus due to protrusion of the previous ramus stents into LM. IABP placed and taken for emergent CABG with LIMA ->LAD and SVG to RI (Dr. Roxy Manns) Echo in 5/21 EF 45-50% (25% at cath). Post-op course c/b PAF and started on amio (which was eventually stopped).   Admitted 04/26/20 with worsening HF symptoms and failure to thrive. Underwent CT chest with poor opacification of bilateral LL pulmonary arteries. Large right effusion, persistent volume overload/HF and increasing pulmonary nodules. Underwent thoracentesis and bronch/biopsy. Echo obtained and EF read as 30-35% (felt closer to 20-25% on MD review with moderate RV dysfunction and severe MR, mod-severe TE). Progressed to cardiogenic shock. PICC placed. Initial Co-ox 41%. Started on milrinone and increased to 0.375. Developed AFL with RVR. Started on IV amio. Ultimately required mechanical support. Implella 5.5 placed on 11/30. Hemodynamics and renal function improved. Impella extracted 12/13. Milrinone restarted post extraction for marginal co-ox. Developed hypotension and losartan and spiro discontinued. Midodrine added to support BP. Other medical problems addressed this admit included RLL PNA treated w/ abx and VT requiring DCCV. Regarding long term options for heart failure, he is not a transplant candidate. Barriers to LVAD include nutritional status/deconditioning.Discharged to CIR on on 05/23/20.   He had been doing  well on CIR and was set up for discharge 06/01/20. Today he was working with OT and had completed his bath and was feeling ok. Dr Orvan Seen saw him with me and he recommended suture removal. I removed sutures from R upper chest and R lateral chest and he tolerated without difficulty. After I left OT walked back in the room and he was unresponsive. Code initiated. Zoll rhythm showed asystole. Had CPR and given epi with ROSC. Intubated and transferred to [facility]. He was not response. CCM consulted.Remains on milrinone 0.125 mcg.    Review of Systems: [y] = yes, _0  = no Intubated.   General: Weight gain _1 ; Weight loss _2 ; Anorexia _3 ; Fatigue _4 ; Fever _5 ; Chills _6 ; Weakness _7   Cardiac: Chest pain/pressure _8 ; Resting SOB _9 ; Exertional SOB _10 ; Orthopnea _11 ; Pedal Edema _12 ; Palpitations _13 ; Syncope _14 ; Presyncope _15 ; Paroxysmal nocturnal dyspnea_16   Pulmonary: Cough _17 ; Wheezing_18 ; Hemoptysis_19 ; Sputum _20 ; Snoring _21   GI: Vomiting_22 ; Dysphagia_23 ; Melena_24 ; Hematochezia _25 ; Heartburn_26 ; Abdominal pain _27 ; Constipation _28 ; Diarrhea _29 ; BRBPR _30   GU: Hematuria_31 ; Dysuria _32 ; Nocturia_33   Vascular: Pain in legs with walking _34 ; Pain in feet with lying flat _35 ; Non-healing sores _36 ; Stroke _37 ; TIA _38 ; Slurred speech _39 ;  Neuro: Headaches_40 ; Vertigo_41 ; Seizures_42 ; Paresthesias_43 ;Blurred vision _44 ; Diplopia _45 ; Vision changes _46   Ortho/Skin: Arthritis _47 ; Joint pain _48 ; Muscle pain _49 ; Joint swelling _50 ; Back Pain _51 ;  Rash _0   Psych: Depression_1 ; Anxiety_2   Heme: Bleeding problems _3 ; Clotting disorders _4 ; Anemia _5   Endocrine: Diabetes _6 ; Thyroid dysfunction_7    Home Medications Prior to Admission medications   Medication Sig Start Date End Date Taking? Authorizing Provider  amiodarone (PACERONE) 200 MG tablet Take 1 tablet (200 mg total) by mouth daily. 05/23/20   Lyda Jester M, PA-C  apixaban (ELIQUIS) 5 MG TABS tablet Take 1 tablet (5 mg  total) by mouth 2 (two) times daily. 04/01/20 05/01/20  Darliss Cheney, MD  aspirin EC 81 MG tablet Take 1 tablet (81 mg total) by mouth daily. 08/10/14   Nahser, Wonda Cheng, MD  azaTHIOprine (IMURAN) 50 MG tablet Take 100 mg by mouth 2 (two) times daily.  10/11/14   [provider]  bisacodyl (DULCOLAX) 10 MG suppository Place 1 suppository (10 mg total) rectally daily as needed for moderate constipation (If miralax is ineffective for moderate constipation). 05/23/20   Lyda Jester M, PA-C  dapagliflozin propanediol (FARXIGA) 10 MG TABS tablet Take 1 tablet (10 mg total) by mouth daily. 05/24/20   Lyda Jester M, PA-C  digoxin (LANOXIN) 0.125 MG tablet Take 1 tablet (0.125 mg total) by mouth daily. 05/24/20   Lyda Jester M, PA-C  docusate (COLACE) 50 MG/5ML liquid Take 10 mLs (100 mg total) by mouth 2 (two) times daily. 05/23/20   Lyda Jester M, PA-C  docusate sodium (COLACE) 100 MG capsule Take 1 capsule (100 mg total) by mouth 2 (two) times daily. 05/23/20   Lyda Jester M, PA-C  dronabinol (MARINOL) 2.5 MG capsule Take 1 capsule (2.5 mg total) by mouth 2 (two) times daily before lunch and supper. 05/23/20   Consuelo Pandy, PA-C  feeding supplement (ENSURE ENLIVE / ENSURE PLUS) LIQD Take 237 mLs by mouth 3 (three) times daily between meals. 05/23/20   Lyda Jester M, PA-C  hydroxychloroquine (PLAQUENIL) 200 MG tablet Take 400 mg by mouth at bedtime.     [provider]  midodrine (PROAMATINE) 2.5 MG tablet Take 1 tablet (2.5 mg total) by mouth 3 (three) times daily with meals. 05/23/20   Lyda Jester M, PA-C  milrinone (PRIMACOR) 20 MG/100 ML SOLN infusion Inject 0.008 mg/min into the vein continuous. 05/23/20   Consuelo Pandy, PA-C  Multiple Vitamin (MULTIVITAMIN WITH MINERALS) TABS tablet Take 1 tablet by mouth daily. 05/24/20   Consuelo Pandy, PA-C  Nutritional Supplements (,FEEDING SUPPLEMENT, PROSOURCE PLUS) liquid Take 30  mLs by mouth 2 (two) times daily between meals. 05/23/20   Lyda Jester M, PA-C  ondansetron (ZOFRAN) 4 MG/2ML SOLN injection Inject 2 mLs (4 mg total) into the vein every 6 (six) hours as needed for nausea. 05/23/20   Consuelo Pandy, PA-C  oxyCODONE (OXY IR/ROXICODONE) 5 MG immediate release tablet Take 1 tablet (5 mg total) by mouth every 6 (six) hours as needed for severe pain. 02/02/20   Rai, Ripudeep K, MD  polyethylene glycol (MIRALAX / GLYCOLAX) 17 g packet Take 17 g by mouth daily as needed for moderate constipation.     [provider]  polyvinyl alcohol (ARTIFICIAL TEARS) 1.4 % ophthalmic solution Place 2 drops into both eyes daily.     [provider]  rosuvastatin (CRESTOR) 20 MG tablet Take 20 mg by mouth daily.    [provider]  spironolactone (ALDACTONE) 25 MG tablet Take 0.5 tablets (12.5 mg total) by mouth daily. 05/24/20   Consuelo Pandy, PA-C  tamsulosin (  FLOMAX) 0.4 MG CAPS capsule Take 0.4 mg by mouth every evening.  04/02/17   [provider]  torsemide (DEMADEX) 20 MG tablet Take 1 tablet (20 mg total) by mouth daily. 05/23/20   Consuelo Pandy, PA-C    Past Medical History: Past Medical History:  Diagnosis Date  . Anxiety   . CHF (congestive heart failure) ([place])   . Chronic tension headache   . Colon polyps   . Coronary artery disease 2008/2009   MI with PCI x 2, then PCI x 1 in 2009  . Depression   . Dyslipidemia   . Dysrhythmia    atrial fibrillation  . Hyperlipidemia   . Hypertension   . Lupus [facility])    sees Dr Trudie Reed  . Lupus disease of the lung   . 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 Myocardial infarction ([place]) 2008  . 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 Shortness of breath     Past Surgical History: Past Surgical History:  Procedure Laterality Date  . BRONCHIAL WASHINGS  04/29/2020   Procedure: BRONCHIAL WASHINGS;  Surgeon: Garner Nash, DO;  Location: [place] [facility];  Service: Pulmonary;;  . CARDIAC CATHETERIZATION    . CLIPPING OF ATRIAL APPENDAGE N/A 10/17/2019   Procedure: Clipping Of Atrial Appendage using AtriCure [id number] 45 MM AtriClip.;  Surgeon: Rexene Alberts, MD;  Location: [place];  Service: Open Heart Surgery;  Laterality: N/A;  . CORONARY ARTERY BYPASS GRAFT N/A 10/17/2019   Procedure: CORONARY ARTERY BYPASS GRAFTING (CABG) using LIMA to LAD; Endoscopic harvest right greater saphenous vein: SVG to RAMUS.;  Surgeon: Rexene Alberts, MD;  Location: [place];  Service: Open Heart Surgery;  Laterality: N/A;  . CORONARY BALLOON ANGIOPLASTY N/A 10/17/2019   Procedure: CORONARY BALLOON ANGIOPLASTY;  Surgeon: Nelva Bush, MD;  Location: [place] CV [facility];  Service: Cardiovascular;  Laterality: N/A;  . coronary stents  2009  . CORONARY/GRAFT ACUTE MI REVASCULARIZATION N/A 10/17/2019   Procedure: Coronary/Graft Acute MI Revascularization;  Surgeon: Nelva Bush, MD;  Location: [place] CV [facility];  Service: Cardiovascular;  Laterality: N/A;  . CORONARY/GRAFT ANGIOGRAPHY N/A 05/04/2020   Procedure: CORONARY/GRAFT ANGIOGRAPHY;  Surgeon: Larey Dresser, MD;  Location: [place] [facility];  Service: Cardiovascular;  Laterality: N/A;  . ENDOVEIN HARVEST OF GREATER SAPHENOUS VEIN Right 10/17/2019   Procedure: [place] Of Greater Saphenous Vein;  Surgeon: Rexene Alberts, MD;  Location: [place];  Service: Open Heart Surgery;  Laterality: Right;  . IABP INSERTION N/A 10/17/2019   Procedure: IABP Insertion;  Surgeon: Nelva Bush, MD;  Location: [place] [facility];  Service: Cardiovascular;  Laterality: N/A;  . INCISION AND DRAINAGE ABSCESS N/A 01/29/2020   Procedure: INCISION AND DRAINAGE BILATERAL PERIRECTAL ABSCESS;  Surgeon: Stark Klein, MD;  Location: [place];  Service: General;  Laterality: N/A;  . IR THORACENTESIS ASP PLEURAL SPACE W/IMG GUIDE  10/26/2019  . IR THORACENTESIS ASP PLEURAL SPACE W/IMG GUIDE   04/27/2020  . LEFT HEART CATH AND CORS/GRAFTS ANGIOGRAPHY N/A 05/04/2020   Procedure: LEFT HEART CATH AND CORS/GRAFTS ANGIOGRAPHY;  Surgeon: Larey Dresser, MD;  Location: [place] CV LAB;  Service: Cardiovascular;  Laterality: N/A;  . PLACEMENT OF IMPELLA LEFT VENTRICULAR ASSIST DEVICE Right 05/03/2020   Procedure: PLACEMENT OF IMPELLA 5.5 LEFT VENTRICULAR ASSIST DEVICE VIA RIGHT AXILLARY;  Surgeon: [name], MD;  Location: [place];  Service: Open Heart Surgery;  Laterality: Right;  . REMOVAL OF IMPELLA LEFT VENTRICULAR ASSIST DEVICE N/A 05/16/2020   Procedure: REMOVAL OF IMPELLA LEFT VENTRICULAR ASSIST DEVICE;  Surgeon: Ivin Poot, MD;  Location: [place];  Service: Open Heart Surgery;  Laterality: N/A;  . RIGHT/LEFT HEART CATH AND CORONARY ANGIOGRAPHY N/A 10/17/2019   Procedure: RIGHT/LEFT HEART CATH AND CORONARY ANGIOGRAPHY;  Surgeon: Nelva Bush, MD;  Location: [place] CV LAB;  Service: Cardiovascular;  Laterality: N/A;  . TEE WITHOUT CARDIOVERSION  10/17/2019   Procedure: Transesophageal Echocardiogram (Tee);  Surgeon: Rexene Alberts, MD;  Location: [facility];  Service: Open Heart Surgery;;  . TEE WITHOUT CARDIOVERSION N/A 05/03/2020   Procedure: TRANSESOPHAGEAL ECHOCARDIOGRAM (TEE);  Surgeon: [name], MD;  Location: [place];  Service: Open Heart Surgery;  Laterality: N/A;  . TEE WITHOUT CARDIOVERSION N/A 05/06/2020   Procedure: TRANSESOPHAGEAL ECHOCARDIOGRAM (TEE);  Surgeon: Larey Dresser, MD;  Location: [facility] END[facility]COPY;  Service: Cardiovascular;  Laterality: N/A;  bedside  . VIDEO BRONCHOSCOPY N/A 04/29/2020   Procedure: VIDEO BRONCHOSCOPY WITHOUT FLUORO;  Surgeon: Garner Nash, DO;  Location: [place];  Service: Pulmonary;  Laterality: N/A;    Family History:  Family History  Problem Relation Age of Onset  . Heart disease Mother   . Alcohol abuse Father   . Hyperlipidemia Sister   . Hypertension Sister   . Hyperlipidemia Brother   . Hypertension  Brother   . Cancer Brother        ?lung cancer  . Stomach cancer Brother   . Diabetes Paternal Uncle   . Colon cancer Neg Hx   . Esophageal cancer Neg Hx   . Rectal cancer Neg Hx     Social History: Social History   Socioeconomic History  . Marital status: Married    Spouse name: Not on file  . Number of children: 5  . Years of education: Not on file  . Highest education level: Not on file  Occupational History  . Occupation: Engineer, production for [employer]    Comment: retired  Tobacco Use  . Smoking status: Former Smoker    Packs/day: 1.00    Years: 20.00    Pack years: 20.00    Types: Cigarettes    Quit date: 02/26/1993    Years since quitting: 27.2  . Smokeless tobacco: Never Used  . Tobacco comment: QUIT SMOKING 20 YEARS AGO  Vaping Use  . Vaping Use: Never used  Substance and Sexual Activity  . Alcohol use: No  . Drug use: No  . Sexual activity: Not on file  Other Topics Concern  . Not on file  Social History Narrative   No living will   Wife should make health care decisions--alternate is sons   Would accept resuscitation   Not sure about tube feeds   Social Determinants of Health   Financial Resource Strain: Not on file  Food Insecurity: No Food Insecurity  . Worried About Charity fundraiser in the Last Year: Never true  . Ran Out of Food in the Last Year: Never true  Transportation Needs: No Transportation Needs  . Lack of Transportation (Medical): No  . Lack of Transportation (Non-Medical): No  Physical Activity: Insufficiently Active  . Days of Exercise per Week: 4 days  . Minutes of Exercise per Session: 10 min  Stress: Not on file  Social Connections: Not on file    Allergies:  No Known Allergies  Objective:    Vital Signs:  There were no vitals filed for this visit.   Physical Exam     General:  Intubated.  HEENT: Normal Neck: Supple. no JVD. Carotids 2+ bilat; no bruits. No lymphadenopathy or thyromegaly  appreciated. Cor: PMI nondisplaced. Regular rate & rhythm. No rubs, gallops or murmurs. Lungs: Clear Abdomen: Soft, nontender, nondistended. No hepatosplenomegaly. No bruits or masses. Good bowel sounds. Extremities: No cyanosis, clubbing, rash, edema Neuro: Intubated. Unresponsive.    Telemetry   SR 70s   EKG   Ordered.   Labs     Basic Metabolic Panel: Recent Labs  Lab 05/25/20 0321 05/27/20 0413 05/29/20 0435 05/31/20 0358  NA 138 139 139 140  K 4.1 4.2 4.3 4.2  CL 96* 96* 98 100  CO2 32 32 31 31  GLUCOSE 114* 107* 98 84  BUN 37* 39* 36* 33*  CREATININE 1.17 1.23 1.28* 1.21  CALCIUM 8.3* 8.6* 8.7* 8.9    Liver Function Tests: No results for input(s): AST, ALT, ALKPHOS, BILITOT, PROT, ALBUMIN in the last 168 hours. No results for input(s): LIPASE, AMYLASE in the last 168 hours. No results for input(s): AMMONIA in the last 168 hours.  CBC: Recent Labs  Lab 05/30/20 0407  WBC 6.0  NEUTROABS 3.7  HGB 9.3*  HCT 31.7*  MCV 92.7  PLT 280    Cardiac Enzymes: No results for input(s): CKTOTAL, CKMB, CKMBINDEX, TROPONINI in the last 168 hours.  BNP: BNP (last 3 results) Recent Labs    04/06/20 2134 04/26/20 1319 04/30/20 0248  BNP 1,677.6* 1,824.4* 1,685.5*    ProBNP (last 3 results) No results for input(s): PROBNP in the last 8760 hours.   CBG: No results for input(s): GLUCAP in the last 168 hours.  Coagulation Studies: No results for input(s): LABPROT, INR in the last 72 hours.  Imaging: No results found.   Assessment/Plan   1. Unwitnessed Arrest- Asystole -CPR initiated immediately. Given Epi with ROSC.  - Obtain CT of head? He has been on eliquis.  - Check CMET 2. Chronic systolic CHF/cardiogenic shock: Ischemic cardiomyopathy. Echo this admission looks worse than 10/21 with EF 20-25% with septal akinesis, mid to apical inferior akinesis, apical anterior and apex akinesis, moderately dilated and dysfunctional RV, moderate TR, severe  MR, bicuspid aortic valve with no stenosis, mild aortic insufficiency. Progressive/end-stage CHF worsened by onset of atrial fibrillation. Cardiogenic shock 11/30 with co-ox down to 30%, Impella 5.5 placed to allow time to stabilize and consider further steps. Impella extracted 12/13. 0.125 milrinone added post explant for marginal co-ox at 51%. Remains on milrinone 0.125. Co-ox 49% today.  He has end-stage biventricular HF. He is not a transplant candidate. Barriers to LVAD include nutritional status/deconditioning and RV. I think RV would be acceptable. We discussed in [place], not LVAD candidate at this point with malnutrition/deconditioning. He is now in [place] for aggressive PT. Nutritional status improving. Will continue on milrinone as bridge to VAD to allow time to improve nutritional status and mobility. Not sure we will get to VAD will need to how he recovers. - On milrinone 0.125 mcg/kg/min. Check CO-OX now.  - Volume status stable. Set up CVP.  - No bb with low output.  - Off losartan with low BP.  - Continue digoxin 0.125.   3. CAD: Prior history of MI with LAD and ramus PCI. AdmittedMay 2021with anterior STEMI. LHC with occluded ostial LAD (in-stent), 90% ostial ramus, 60-70% proximal LCx, nondominant RCA. PTCA to LAD and ramus to restore flow, then CABG with LIMA-LAD and  SVG-ramus. No chest pain, doubt ACS. Fall in EF from 10/21 to 11/21, but he did not present with ACS. [place] 12/1 showed patient LIMA-LAD, patent native LAD, patent dominant LCx. The SVG-ramus is occluded with severe diffuse in-stent restenosis in proximal ramus. Ramus not intervened upon as this would be a complex intervention and unlikely to markedly improve EF.  - Check HS Trop.  - Continue ASA 81 and statin.  4.  AKI: Check BMET now.  5. Right pleural effusion: S/p thoracentesis, transudative (CHF).  6. Atrial fibrillation/flutter with RVR: DCCV on 12/3, went back to NSR 12/7.  In NSR.  - Continueamiodarone 200  mg once daily.  - Hold apixaban.  - Check CT of head. Then start heparin drip if no bleed.  7.. Mitral regurgitation: TEE 12/3 with moderate central MR. Doubt MitraClip will help him much with moderate MR and he is likely too advanced.  8. H/o PE: In 10/21. Keep anticoagulated, on apixaban. No bleeding  9 Bicuspid aortic valve: Mild AI, no AS.  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 Hold Imuran and hydroxychloroquine.  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  12. ID: Concern for right-sided PNA, ?aspiration. Cultures NGTD. Completed course of vancomycin/cefepime.  13. Thrombocytopenia:  - Holding  apixaban.  - Check CBC.  14. Hypokalemia/hypomag: Check BMET  15. Acute blood loss anemia: Check CBC today   16. VT: Episode early am 12/7, required DCCV.  Not sure cause of arrest. CCM appreciated. CT of head. Start heparin drip.   Darrick Grinder, NP 05/31/2020, 3:34 PM  Advanced Heart Failure Team Pager [phone] (M-F; 7a - 4p)  Please contact [facility] for night-coverage after hours (4p -7a ) and weekends on [email or web address]  Patient seen with NP, agree with the above note.    Patient had been doing very well with PT and was stable, planned for discharge tomorrow.  However, after OT this afternoon he passed out and was found to be asystolic.  CPR was initiated and he received 1 mg epinephrine IV.  He promptly regained his pulse.  SBP up to 130s.  He was intubated and transferred to [facility].   General: Intubated Neck: No JVD, no thyromegaly or thyroid nodule.  Lungs: Clear to auscultation bilaterally with normal respiratory effort. CV: Nondisplaced PMI.  Heart regular S1/S2, no S3/S4, no murmur.  No peripheral edema.   Abdomen: Soft, nontender, no hepatosplenomegaly, no distention.  Skin: Intact without lesions or rashes.  Neurologic: Not responsive Extremities: No clubbing or cyanosis.  HEENT: Normal.   Cause of asystole uncertain.  Prior to event, no dyspnea  or chest pain. No significant lab abnormalities today.  - Send HS-TnI - Repeat labs.  - Stat echo r/o pericardial effusion => no pericardial effusion, EF remains in 25% range with mildly decreased RV systolic function.  No significant change from prior.  - He will need head CT to rule out head bleed, not responsive currently on vent.   He has been on apixaban for PAF, will continue amiodarone and use heparin gtt if CT negative for bleed.   Resend co-ox and continue milrinone 0.125.   Needs CXR.   "Aggressive normothermia."   CRITICAL CARE Performed by: Loralie Champagne  Total critical care time: 40 minutes  Critical care time was exclusive of separately billable procedures and treating other patients.  Critical care was necessary to treat or prevent imminent or life-threatening deterioration.  Critical care was time spent personally by me on the following activities: development of treatment  plan with patient and/or surrogate as well as nursing, discussions with consultants, evaluation of patient's response to treatment, examination of patient, obtaining history from patient or surrogate, ordering and performing treatments and interventions, ordering and review of laboratory studies, ordering and review of radiographic studies, pulse oximetry and re-evaluation of patient's condition.  Loralie Champagne 05/31/2020 4:02 PM

## 2020-05-31 NOTE — Progress Notes (Signed)
Called for hsTroponin trend 75->127, not completely unexpected given cardiac arrest earlier - no substantial rise. Reviewed with Dr. Anne Fu on call - no change in plan as outlined.  Also saw stat head CT ordered and not yet resulted - nurse plans to call CT next to inquire about time. Will add hsTroponin value for AM to further trend. Amily Depp PA-C

## 2020-05-31 NOTE — Progress Notes (Signed)
At approximately 1944, RT was in pt room suctioning pt prior to transport to CT and pt went into vtach. Code called, compressions started. Pt received 1 shock and 1 epi. Pulse check showed that the pt regained pulses. Pt responsive and able to follow commands (squeezes hands and tracks). See code sheet for more details. Page-called Ronie Spies PA on call who notified Donato Schultz MD (both came to bedside).

## 2020-05-31 NOTE — Progress Notes (Signed)
eLink Physician-Brief Progress Note Patient Name: Cody Arellano DOB: 03-24-1946 MRN: [id number]   Date of Service  05/31/2020  HPI/Events of Note  Patient following commands and TTM D/Ced by bedside physicians.  eICU Interventions  Plan: 1. Will D/C scheduled Tylenol and Buspar.      Intervention Category Major Interventions: Other:  Lenell Antu 05/31/2020, 9:40 PM

## 2020-05-31 NOTE — Progress Notes (Signed)
vLTM EEG started after spot EEG (same leads) notified neuro

## 2020-05-31 NOTE — Progress Notes (Addendum)
Called by nurse due to code blue. RT had arrived to do suctioning prior to transport to CT and patient went into VT. Appears polymorphic/torsades on telemetry review. Code called. CPR started, reported 2-3 minutes. Received 1 shock and CPR resumed. 1 of epi given. Patient regained consciousness and pulse check showed regained pulses. Patient began following commands again. Also able to squeeze R/L hands equally bilaterally, blinks eyes. Remains intubated. Breath sounds are equal bilaterally without obvious stridor, wheezing or rhonchi. Heart RRR. Stat labs and CXR are pending. Milrinone was paused during code. VS now stable.  Leota Sauers, pharmacist, spoke with Dr. Shirlee Latch who recommended to restart milrinone and initially suggested IV amiodarone (was on oral this admission). Digoxin also stopped. Dr. Anne Fu also arrived to bedside to assess. F/u 12 lead EKG showed NSR 76bpm with nonspecific STT changes with prolonged QTc of , prior inferior infarct. EKG earlier today was somewhat challenging to interpret given biphasic appearance of T waves, but hand calculated QTC appeared to be possibly prolonged at . Given QT prolongation, Dr. Anne Fu spoke further with Dr. Shirlee Latch and per their discussion, decision was made to change amiodarone to lidocaine instead. Dr. Anne Fu is on call this evening and will follow up lab results particularly K/Mg. Per our discussion, empiric mag not given since level was 2.4 a short while ago. Per MDs, would still proceed with CT this evening but wait a few hours to ensure stable prior to transport (nurse aware).   Dr. Anne Fu spoke with wife to give her update. Kaelen Caughlin PA-C

## 2020-05-31 NOTE — Progress Notes (Signed)
° ° ° °  Responded secondary to CODE BLUE.  Telemetry reviewed, had R on T phenomenon with torsade/polymorphic ventricular tachycardia.  1 defibrillation took place. 1 round of epinephrine. Approximately 4 minutes of CPR.  Magnesium earlier today at around 3 PM was 2.4.  He had been on oral amiodarone for several days.  Post CPR/code ECG shows prolonged QTC of 590 ms.  Because of this, amiodarone was discontinued and lidocaine IV 1 mg/kg dose will be started.  Lab work currently pending.  Troponin I 27  Lactic acid 3.9  PH--7.4/44/430  Chest x-ray personally reviewed shows patchy opacities bilaterally, no significant effusions, no pneumothorax, no apparent rib fractures.  Thin, ill-appearing, intubated Patient has eyes open, tracking, able to squeeze hands bilaterally by command.   Spoke to Dr. Shirlee Latch, Leota Sauers with pharmacy team, Ronie Spies with cardiology team, nursing staff.  Phone call to Tammy his wife, updated her on gravity of situation.   Thankfully, currently sustaining systolic blood pressure 124, sinus rhythm in the 70s.  CRITICAL CARE Performed by: Donato Schultz   Total critical care time: 55 minutes  Critical care time was exclusive of separately billable procedures and treating other patients.  Critical care was necessary to treat or prevent imminent or life-threatening deterioration.  Critical care was time spent personally by me on the following activities: development of treatment plan with patient and/or surrogate as well as nursing, discussions with consultants, evaluation of patient's response to treatment, examination of patient, obtaining history from patient or surrogate, ordering and performing treatments and interventions, ordering and review of laboratory studies, ordering and review of radiographic studies, pulse oximetry and re-evaluation of patient's condition.  Donato Schultz, MD

## 2020-05-31 NOTE — Progress Notes (Signed)
NAME:  Cody Arellano, MRN:  [id number], DOB:  29-Oct-1945, LOS: 0 ADMISSION DATE:  05/31/2020, CONSULTATION DATE: 05/31/2020 REFERRING MD: [facility] heart, CHIEF COMPLAINT: Post cardiac arrest  HPI/course in hospital   74 year old man with advanced ischemic cardiomyopathy who was awaiting discharge prior to LVAD implantation.  Had been doing well and had participated in physiotherapy this morning.  Found unresponsive and not breathing.  CPR initiated and regained pulse following 1 dose of epinephrine.  Patient intubated and brought to ICU.  Initially admitted late November in cardiogenic shock underwent Impella implantation which was eventually extracted 12/13.  Had been in rehabilitation in anticipation of LVAD implantation.  Past Medical History   Past Medical History:  Diagnosis Date  . Anxiety   . CHF (congestive heart failure) (HCC)   . Chronic tension headache   . Colon polyps   . Coronary artery disease 2008/2009   MI with PCI x 2, then PCI x 1 in 2009  . Depression   . Dyslipidemia   . Dysrhythmia    atrial fibrillation  . Hyperlipidemia   . Hypertension   . Lupus [facility])    sees Dr Nickola Major  . Lupus disease of the lung   . Lupus pericarditis (HCC)   . Myocardial infarction (HCC) 2008  . 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 Shortness of breath      Past Surgical History:  Procedure Laterality Date  . BRONCHIAL WASHINGS  04/29/2020   Procedure: BRONCHIAL WASHINGS;  Surgeon: Josephine Igo, DO;  Location: [facility] ENDOSCOPY;  Service: Pulmonary;;  . CARDIAC CATHETERIZATION    . CLIPPING OF ATRIAL APPENDAGE N/A 10/17/2019   Procedure: Clipping Of Atrial Appendage using AtriCure [id number] 45 MM AtriClip.;  Surgeon: Purcell Nails, MD;  Location: [facility] OR;  Service: Open Heart Surgery;  Laterality: N/A;  . CORONARY ARTERY BYPASS GRAFT N/A 10/17/2019   Procedure: CORONARY ARTERY BYPASS GRAFTING (CABG) using LIMA to LAD;  Endoscopic harvest right greater saphenous vein: SVG to RAMUS.;  Surgeon: Purcell Nails, MD;  Location: [facility] OR;  Service: Open Heart Surgery;  Laterality: N/A;  . CORONARY BALLOON ANGIOPLASTY N/A 10/17/2019   Procedure: CORONARY BALLOON ANGIOPLASTY;  Surgeon: Yvonne Kendall, MD;  Location: [facility] INVASIVE CV LAB;  Service: Cardiovascular;  Laterality: N/A;  . coronary stents  2009  . CORONARY/GRAFT ACUTE MI REVASCULARIZATION N/A 10/17/2019   Procedure: Coronary/Graft Acute MI Revascularization;  Surgeon: Yvonne Kendall, MD;  Location: [facility] INVASIVE CV LAB;  Service: Cardiovascular;  Laterality: N/A;  . CORONARY/GRAFT ANGIOGRAPHY N/A 05/04/2020   Procedure: CORONARY/GRAFT ANGIOGRAPHY;  Surgeon: Laurey Morale, MD;  Location: [facility] INVASIVE CV LAB;  Service: Cardiovascular;  Laterality: N/A;  . ENDOVEIN HARVEST OF GREATER SAPHENOUS VEIN Right 10/17/2019   Procedure: Mack Guise Of Greater Saphenous Vein;  Surgeon: Purcell Nails, MD;  Location: [facility] OR;  Service: Open Heart Surgery;  Laterality: Right;  . IABP INSERTION N/A 10/17/2019   Procedure: IABP Insertion;  Surgeon: Yvonne Kendall, MD;  [facility] INVAS[facility] CV LAB;  Service: Cardiovascular;  Laterality: N/A;  . INCISION AND DRAINAGE ABSCESS N/A 01/29/2020   Procedure: INCISION AND DRAINAGE BILATERAL PERIRECTAL ABSCESS;  Surgeon: Almond Lint, MD;  Location: [facility] OR;  Service: General;  Laterality: N/A;  . IR THORACENTESIS ASP PLEURAL SPACE W/IMG GUIDE  10/26/2019  . IR THORACENTESIS ASP PLEURAL SPACE W/IMG GUIDE  04/27/2020  . LEFT HEART CATH AND CORS/GRAFTS ANGIOGRAPHY N/A  05/04/2020   Procedure: LEFT HEART CATH AND CORS/GRAFTS ANGIOGRAPHY;  Surgeon: Larey Dresser, MD;  Location: [place] CV LAB;  Service: Cardiovascular;  Laterality: N/A;  . PLACEMENT OF IMPELLA LEFT VENTRICULAR ASSIST DEVICE Right 05/03/2020   Procedure: PLACEMENT OF IMPELLA 5.5 LEFT VENTRICULAR ASSIST DEVICE VIA RIGHT AXILLARY;  Surgeon: [name], MD;   Location: [place];  Service: Open Heart Surgery;  Laterality: Right;  . REMOVAL OF IMPELLA LEFT VENTRICULAR ASSIST DEVICE N/A 05/16/2020   Procedure: REMOVAL OF IMPELLA LEFT VENTRICULAR ASSIST DEVICE;  Surgeon: Ivin Poot, MD;  Location: [place];  Service: Open Heart Surgery;  Laterality: N/A;  . RIGHT/LEFT HEART CATH AND CORONARY ANGIOGRAPHY N/A 10/17/2019   Procedure: RIGHT/LEFT HEART CATH AND CORONARY ANGIOGRAPHY;  Surgeon: Nelva Bush, MD;  Location: [place] CV LAB;  Service: Cardiovascular;  Laterality: N/A;  . TEE WITHOUT CARDIOVERSION  10/17/2019   Procedure: Transesophageal Echocardiogram (Tee);  Surgeon: Rexene Alberts, MD;  Location: [facility] OR;  Service: Open Heart Surgery;;  . TEE WITHOUT CARDIOVERSION N/A 05/03/2020   Procedure: TRANSESOPHAGEAL ECHOCARDIOGRAM (TEE);  Surgeon: [name], MD;  Location: [place];  Service: Open Heart Surgery;  Laterality: N/A;  . TEE WITHOUT CARDIOVERSION N/A 05/06/2020   Procedure: TRANSESOPHAGEAL ECHOCARDIOGRAM (TEE);  Surgeon: Larey Dresser, MD;  Location: [facility] ENDOSCOPY;  Service: Cardiovascular;  Laterality: N/A;  bedside  . VIDEO BRONCHOSCOPY N/A 04/29/2020   Procedure: VIDEO BRONCHOSCOPY WITHOUT FLUORO;  Surgeon: Garner Nash, DO;  Location: [place];  Service: Pulmonary;  Laterality: N/A;     Review of Systems:   Review of Systems  Unable to perform ROS: Critical illness    Social History   reports that he quit smoking about 27 years ago. His smoking use included cigarettes. He has a 20.00 pack-year smoking history. He has never used smokeless tobacco. He reports that he does not drink alcohol and does not use drugs.   Family History   His family history includes Alcohol abuse in his father; Cancer in his brother; Diabetes in his paternal uncle; Heart disease in his mother; Hyperlipidemia in his brother and sister; Hypertension in his brother and sister; Stomach cancer in his brother. There is no history of Colon cancer,  Esophageal cancer, or Rectal cancer.   Allergies No Known Allergies   Home Medications  Prior to Admission medications   Medication Sig Start Date End Date Taking? Authorizing Provider  amiodarone (PACERONE) 200 MG tablet Take 1 tablet (200 mg total) by mouth daily. 05/23/20   Lyda Jester M, PA-C  apixaban (ELIQUIS) 5 MG TABS tablet Take 1 tablet (5 mg total) by mouth 2 (two) times daily. 04/01/20 05/01/20  Darliss Cheney, MD  aspirin EC 81 MG tablet Take 1 tablet (81 mg total) by mouth daily. 08/10/14   Nahser, Wonda Cheng, MD  azaTHIOprine (IMURAN) 50 MG tablet Take 100 mg by mouth 2 (two) times daily.  10/11/14   [provider]  bisacodyl (DULCOLAX) 10 MG suppository Place 1 suppository (10 mg total) rectally daily as needed for moderate constipation (If miralax is ineffective for moderate constipation). 05/23/20   Lyda Jester M, PA-C  dapagliflozin propanediol (FARXIGA) 10 MG TABS tablet Take 1 tablet (10 mg total) by mouth daily. 05/24/20   Lyda Jester M, PA-C  digoxin (LANOXIN) 0.125 MG tablet Take 1 tablet (0.125 mg total) by mouth daily. 05/24/20   Lyda Jester M, PA-C  docusate (COLACE) 50 MG/5ML liquid Take 10 mLs (100 mg total) by  mouth 2 (two) times daily. 05/23/20   Lyda Jester M, PA-C  docusate sodium (COLACE) 100 MG capsule Take 1 capsule (100 mg total) by mouth 2 (two) times daily. 05/23/20   Lyda Jester M, PA-C  dronabinol (MARINOL) 2.5 MG capsule Take 1 capsule (2.5 mg total) by mouth 2 (two) times daily before lunch and supper. 05/23/20   Consuelo Pandy, PA-C  feeding supplement (ENSURE ENLIVE / ENSURE PLUS) LIQD Take 237 mLs by mouth 3 (three) times daily between meals. 05/23/20   Lyda Jester M, PA-C  hydroxychloroquine (PLAQUENIL) 200 MG tablet Take 400 mg by mouth at bedtime.     [provider]  midodrine (PROAMATINE) 2.5 MG tablet Take 1 tablet (2.5 mg total) by mouth 3 (three) times daily with meals.  05/23/20   Lyda Jester M, PA-C  milrinone (PRIMACOR) 20 MG/100 ML SOLN infusion Inject 0.008 mg/min into the vein continuous. 05/23/20   Consuelo Pandy, PA-C  Multiple Vitamin (MULTIVITAMIN WITH MINERALS) TABS tablet Take 1 tablet by mouth daily. 05/24/20   Consuelo Pandy, PA-C  Nutritional Supplements (,FEEDING SUPPLEMENT, PROSOURCE PLUS) liquid Take 30 mLs by mouth 2 (two) times daily between meals. 05/23/20   Lyda Jester M, PA-C  ondansetron (ZOFRAN) 4 MG/2ML SOLN injection Inject 2 mLs (4 mg total) into the vein every 6 (six) hours as needed for nausea. 05/23/20   Consuelo Pandy, PA-C  oxyCODONE (OXY IR/ROXICODONE) 5 MG immediate release tablet Take 1 tablet (5 mg total) by mouth every 6 (six) hours as needed for severe pain. 02/02/20   Rai, Ripudeep K, MD  polyethylene glycol (MIRALAX / GLYCOLAX) 17 g packet Take 17 g by mouth daily as needed for moderate constipation.     [provider]  polyvinyl alcohol (ARTIFICIAL TEARS) 1.4 % ophthalmic solution Place 2 drops into both eyes daily.     [provider]  rosuvastatin (CRESTOR) 20 MG tablet Take 20 mg by mouth daily.    [provider]  spironolactone (ALDACTONE) 25 MG tablet Take 0.5 tablets (12.5 mg total) by mouth daily. 05/24/20   Lyda Jester M, PA-C  tamsulosin (FLOMAX) 0.4 MG CAPS capsule Take 0.4 mg by mouth every evening.  04/02/17   [provider]  torsemide (DEMADEX) 20 MG tablet Take 1 tablet (20 mg total) by mouth daily. 05/23/20   Consuelo Pandy, PA-C     Interim history/subjective:  Patient has not woken up and is unresponsive post cardiac arrest, having received no sedation  Objective   There were no vitals taken for this visit.       No intake or output data in the 24 hours ending 05/31/20 1539 There were no vitals filed for this visit.  Examination: Physical Exam Constitutional:      Appearance: He is cachectic.     Interventions: He  is intubated.  HENT:     Head:     Comments: Orally intubated Cardiovascular:     Rate and Rhythm: Normal rate and regular rhythm.     Heart sounds: S1 normal and S2 normal. No S3 or S4 sounds.   Pulmonary:     Effort: He is intubated.     Breath sounds: Normal breath sounds.     Comments: Chest clear Abdominal:     General: Abdomen is flat.     Palpations: Abdomen is soft.  Musculoskeletal:     Right lower leg: No edema.     Left lower leg: No edema.  Neurological:  Mental Status: He is unresponsive.     GCS: GCS eye subscore is 1. GCS verbal subscore is 1. GCS motor subscore is 1.      Ancillary tests (personally reviewed)  CBC: Recent Labs  Lab 05/30/20 0407  WBC 6.0  NEUTROABS 3.7  HGB 9.3*  HCT 31.7*  MCV 92.7  PLT 123456    Basic Metabolic Panel: Recent Labs  Lab 05/25/20 0321 05/27/20 0413 05/29/20 0435 05/31/20 0358  NA 138 139 139 140  K 4.1 4.2 4.3 4.2  CL 96* 96* 98 100  CO2 32 32 31 31  GLUCOSE 114* 107* 98 84  BUN 37* 39* 36* 33*  CREATININE 1.17 1.23 1.28* 1.21  CALCIUM 8.3* 8.6* 8.7* 8.9   GFR: Estimated Creatinine Clearance: 44.2 mL/min (by C-G formula based on SCr of 1.21 mg/dL). Recent Labs  Lab 05/30/20 0407  WBC 6.0    Liver Function Tests: No results for input(s): AST, ALT, ALKPHOS, BILITOT, PROT, ALBUMIN in the last 168 hours. No results for input(s): LIPASE, AMYLASE in the last 168 hours. No results for input(s): AMMONIA in the last 168 hours.  ABG    Component Value Date/Time   PHART 7.427 05/16/2020 1059   PCO2ART 43.7 05/16/2020 1059   PO2ART 554 (H) 05/16/2020 1059   HCO3 28.8 (H) 05/16/2020 1059   TCO2 30 05/16/2020 1059   ACIDBASEDEF 1.0 05/03/2020 1334   O2SAT 59.6 05/31/2020 0358     Coagulation Profile: No results for input(s): INR, PROTIME in the last 168 hours.  Cardiac Enzymes: No results for input(s): CKTOTAL, CKMB, CKMBINDEX, TROPONINI in the last 168 hours.  HbA1C: Hgb A1c MFr Bld  Date/Time  Value Ref Range Status  10/17/2019 12:05 PM 5.7 (H) 4.8 - 5.6 % Final    Comment:    (NOTE) Pre diabetes:          5.7%-6.4% Diabetes:              >6.4% Glycemic control for   <7.0% adults with diabetes     CBG: No results for input(s): GLUCAP in the last 168 hours.   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  Daily Goals Checklist  Pain/Anxiety/Delirium protocol (if indicated): Fentanyl and propofol infusions to keep RASS -3/-4 VAP protocol (if indicated): Bundle Respiratory support goals: Full ventilatory support.  Follow ABG Blood pressure target: Keep MAP Grant 65, SCV O2 greater than 60 DVT prophylaxis: IV heparin if CT head negative Nutritional status and feeding goals: Severe malnutrition.  Initiate full dose tube feeds GI prophylaxis: Pantoprazole Fluid status goals: Allow autoregulation Urinary catheter: Assessment of intravascular volume and Continuous temperature monitoring Central lines: None Glucose control: Phase 1 glycemic protocol Mobility/therapy needs: Bedrest Antibiotic de-escalation: No antibiotic Home medication reconciliation: On Daily labs: CMP, CBC Code Status: Full code Family Communication: Updated by heart failure service Disposition: ICU  CRITICAL CARE Performed by: Kipp Brood   Total critical care time: 40 minutes  Critical care time was exclusive of separately billable procedures and treating other patients.  Critical care was necessary to treat or prevent imminent or life-threatening  deterioration.  Critical care was time spent personally by me on the  following activities: development of treatment plan with patient and/or surrogate as well as nursing, discussions with consultants, evaluation of patient's response to treatment, examination of patient, obtaining history from patient or surrogate, ordering and performing treatments and interventions, ordering and review of laboratory studies, ordering and review of radiographic studies, pulse oximetry, re-evaluation of patient's condition and participation in multidisciplinary rounds.  Kipp Brood, MD [facility] ICU Physician [place]  Pager: [phone] Mobile: [phone] After hours: [phone].   05/31/2020, 3:39 PM

## 2020-05-31 NOTE — Progress Notes (Signed)
Therapy with pt, said pt wasn't breathing, code called, CPR started and pulse obtained, Code team arrived, CPR started again, medications administered, pt intubated, report called to 2H, wife at bedside.

## 2020-05-31 NOTE — Progress Notes (Signed)
   05/31/20 1945  Clinical Encounter Type  Visited With Patient not available  Visit Type Code  Referral From Nurse  Consult/Referral To Chaplain   Chaplain responded to Code Blue. Pt was being treated and no support person present. No current need for chaplain. Pt's family was being notified. Chaplain gave the on-call number to Pt's nurse, Hilda Lias, in case Pt's family arrived and would like a chaplain. Chaplain remains available as needed.  This note was prepared by Chaplain Resident, Tacy Learn, MDiv. Chaplain remains available as needed through the on-call pager: [phone].

## 2020-05-31 NOTE — Progress Notes (Signed)
ANTICOAGULATION CONSULT NOTE - Initial Consult  Pharmacy Consult for heparin Indication: atrial fibrillation, s/p cardiac arrest  No Known Allergies  Patient Measurements: Height: 6' (182.9 cm) Weight: 58.4 kg (128 lb 12 oz) IBW/kg (Calculated) : 77.6 Heparin Dosing Weight: 58.4 kg  Vital Signs: Temp: 98.06 F (36.7 C) (12/28 2200) Temp Source: Bladder (12/28 2200) BP: 173/59 (12/28 2300) Pulse Rate: 63 (12/28 2300)  Labs: Recent Labs    05/30/20 0407 05/31/20 0358 05/31/20 1533 05/31/20 1729 05/31/20 2005 05/31/20 2023  HGB 9.3*  --  10.3*  --  10.2* 11.9*  HCT 31.7*  --  35.6*  --  34.3* 35.0*  PLT 280  --  332  --  318  --   CREATININE  --  1.21 1.57*  --  1.63*  --   TROPONINIHS  --   --  75* 127*  --   --     Estimated Creatinine Clearance: 32.8 mL/min (A) (by C-G formula based on SCr of 1.63 mg/dL (H)).   Medical History: Past Medical History:  Diagnosis Date  . Anxiety   . CHF (congestive heart failure) (HCC)   . Chronic tension headache   . Colon polyps   . Coronary artery disease 2008/2009   MI with PCI x 2, then PCI x 1 in 2009  . Depression   . Dyslipidemia   . Dysrhythmia    atrial fibrillation  . Hyperlipidemia   . Hypertension   . Lupus [facility])    sees Dr Nickola Major  . Lupus disease of the lung   . Lupus pericarditis (HCC)   . Myocardial infarction (HCC) 2008  . 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 Shortness of breath     Medications:  Medications Prior to Admission  Medication Sig Dispense Refill Last Dose  . amiodarone (PACERONE) 200 MG tablet Take 1 tablet (200 mg total) by mouth daily.   Past Week at Unknown time  . apixaban (ELIQUIS) 5 MG TABS tablet Take 1 tablet (5 mg total) by mouth 2 (two) times daily. 60 tablet 0 Past Week at Unknown time  . aspirin EC 81 MG tablet Take 1 tablet (81 mg total) by mouth daily.   Past Week at Unknown time  . azaTHIOprine (IMURAN) 50 MG  tablet Take 100 mg by mouth 2 (two) times daily.   2 Past Week at Unknown time  . docusate sodium (COLACE) 100 MG capsule Take 1 capsule (100 mg total) by mouth 2 (two) times daily. 10 capsule 0 Past Week at Unknown time  . ferrous sulfate 325 (65 FE) MG tablet Take 325 mg by mouth daily with breakfast.   Past Week at Unknown time  . hydroxychloroquine (PLAQUENIL) 200 MG tablet Take 400 mg by mouth at bedtime.    Past Week at Unknown time  . hydroxypropyl methylcellulose / hypromellose (ISOPTO TEARS / GONIOVISC) 2.5 % ophthalmic solution Place 1 drop into both eyes as needed for dry eyes.   Past Week at Unknown time  . Multiple Vitamin (MULTIVITAMIN WITH MINERALS) TABS tablet Take 1 tablet by mouth daily.   Past Week at Unknown time  . Nutritional Supplements (,FEEDING SUPPLEMENT, PROSOURCE PLUS) liquid Take 30 mLs by mouth 2 (two) times daily between meals.   Past Week at Unknown time  . oxyCODONE (OXY IR/ROXICODONE) 5 MG immediate release tablet Take 1 tablet (5 mg total) by mouth every 6 (six) hours as  needed for severe pain. 30 tablet 0 Past Week at Unknown time  . polyethylene glycol (MIRALAX / GLYCOLAX) 17 g packet Take 17 g by mouth daily as needed for moderate constipation.    Past Week at Unknown time  . polyvinyl alcohol (LIQUIFILM TEARS) 1.4 % ophthalmic solution Place 2 drops into both eyes daily.    Past Week at Unknown time  . rosuvastatin (CRESTOR) 20 MG tablet Take 20 mg by mouth daily.   Past Week at Unknown time  . spironolactone (ALDACTONE) 25 MG tablet Take 0.5 tablets (12.5 mg total) by mouth daily.   Past Week at Unknown time  . tamsulosin (FLOMAX) 0.4 MG CAPS capsule Take 0.4 mg by mouth every evening.    Past Week at Unknown time  . torsemide (DEMADEX) 20 MG tablet Take 1 tablet (20 mg total) by mouth daily.   Past Week at Unknown time    Assessment: 74 yo man on eliquis PTA to start heparin drip.  Hg 11.9, PTLC 318  CT head was neg for bleed Goal of Therapy:  aPTT 66-102  sec seconds Monitor platelets by anticoagulation protocol: Yes   Plan:  Start heparin drip at 700 units/hr Check heparin level and aPTT 6-8 hours Monitor for bleeding complications  Cybele Maule Poteet 05/31/2020,11:50 PM

## 2020-05-31 NOTE — Patient Care Conference (Signed)
Inpatient RehabilitationTeam Conference and Plan of Care Update Date: 05/31/2020   Time: 11:35 AM    Patient Name: Cody Arellano      Medical Record Number: [id number]  Date of Birth: 11-Dec-1945 Sex: Male         Room/Bed: 4M10C/4M10C-01 Payor Info: Payor: MEDICARE / Plan: MEDICARE PART A / Product Type: *No Product type* /    Admit Date/Time:  05/23/2020  4:14 PM  Primary Diagnosis:  [facility] Problems: Principal Problem:   Debility Active Problems:   Goals of care, counseling/discussion   Chronic systolic CHF (congestive heart failure) (HCC)   Pressure injury of skin   Palliative care by specialist   Slow transit constipation   Benign prostatic hyperplasia   Sacral pain   Severe malnutrition (HCC)   PAF (paroxysmal atrial fibrillation) [facility])    Expected Discharge Date: Expected Discharge Date: 06/02/20  Team Members Present: Physician leading conference: Dr. Leeroy Cha Care Coodinator Present: Dorthula Nettles, RN, BSN, CRRN;Becky Dupree, LCSW Nurse Present: Isla Pence, RN PT Present: Magda Kiel, PT OT Present: Lillia Corporal, OT PPS Coordinator present : Ileana Ladd, Burna Mortimer, SLP     Current Status/Progress Goal Weekly Team Focus  Bowel/Bladder   Pt continent x2 LBM: 12/25 constipated with stool in rectal vault miralax given to promote Bm  Pt will remain continent x2.  Assess q2 hours and PRN   Swallow/Nutrition/ Hydration             ADL's   CGA/close supervision ADLs for toileting/bathing/dressing, walking to/from bathroom, decreased endurance/fatigue limiting  Supervision  family ed with wife, endurance, HEP, UB strength, energy conservation   Mobility   S with RW, CGA without device  S  endurance, safety, caregiver education   Communication             Safety/Cognition/ Behavioral Observations            Pain   patient does not report pain at this time  Pain < 3 will be maintained.  Assess qshift and PRn   Skin   Pt has stage  2 on coccyx and Right chest sutures  Pt will remain infection free and wounds will be dressed per orders.  Assess qshift and PRN     Discharge Planning:  Home with wife who wil be in today for education. Life Vest ordered yerstaerday per HF-NP. Sent information. IV milirone set up via [facility] and [facility]   Team Discussion: Milrinone drip education planned, family education today, Lifevest on order. OT reports patient is close supervision to contact guard with ADL's and ambulation. PT reports patient is going home with a a RW because it will be safer with a RW. Patient's endurance is improving. Patient on target to meet rehab goals: yes  *See Care Plan and progress notes for long and short-term goals.   Revisions to Treatment Plan:  Continue family education Wound care Life vest education  Teaching Needs: Family education Wound care education Life vest education  Current Barriers to Discharge: Decreased caregiver support, Medical stability, Home enviroment access/layout, Wound care, Lack of/limited family support, Behavior and Nutritional means  Possible Resolutions to Barriers: Continue current medications, offer nutritional supplements, provide wound care education, provide emotional support to patient and family.     Medical Summary Current Status: Debility secondary to acute on chronic systolic congestive heart failure/cardiogenic shock/ischemic cardiomyopathy status post Impella insertion 05/03/2020 with removal of Impella assistive device 05/16/2020  Barriers to Discharge: Decreased family/caregiver support;Wound  care;Weight;Other (comments);Medical stability  Barriers to Discharge Comments: Milrinone GTT, LifeVest at discharge Possible Resolutions to Barriers/Weekly Focus: Therapies, milrinone GGT, LifeVest, optimize p.o. intake, optimize bowel meds   Continued Need for Acute Rehabilitation Level of Care: The patient requires daily medical management by a physician  with specialized training in physical medicine and rehabilitation for the following reasons: Direction of a multidisciplinary physical rehabilitation program to maximize functional independence : Yes Medical management of patient stability for increased activity during participation in an intensive rehabilitation regime.: Yes Analysis of laboratory values and/or radiology reports with any subsequent need for medication adjustment and/or medical intervention. : Yes   I attest that I was present, lead the team conference, and concur with the assessment and plan of the team.   Tennis Must 05/31/2020, 2:53 PM

## 2020-05-31 NOTE — Discharge Instructions (Signed)
Inpatient Rehab Discharge Instructions  Cody Arellano Discharge date and time: No discharge date for patient encounter.   Activities/Precautions/ Functional Status: Activity: activity as tolerated Diet: regular diet Wound Care: Routine skin checks Functional status:  ___ No restrictions     ___ Walk up steps independently ___ 24/7 supervision/assistance   ___ Walk up steps with assistance ___ Intermittent supervision/assistance  ___ Bathe/dress independently ___ Walk with walker     _x__ Bathe/dress with assistance ___ Walk Independently    ___ Shower independently ___ Walk with assistance    ___ Shower with assistance ___ No alcohol     ___ Return to work/school ________  Special Instructions:  No driving smoking or alcohol   Continue milrinone 0.25 mcg as per cardiology services [phone]  LifeVest as directed   COMMUNITY REFERRALS UPON DISCHARGE:    Home Health:   PT, OT, RN                 Agency: [facility] Phone:[phone]  Medical Equipment/Items Ordered:Has a rolling walker from previous hospitalization                                                    My questions have been answered and I understand these instructions. I will adhere to these goals and the provided educational materials after my discharge from the hospital.  Patient/Caregiver Signature _______________________________ Date __________  Clinician Signature _______________________________________ Date __________  Please bring this form and your medication list with you to all your follow-up doctor's appointments.

## 2020-05-31 NOTE — Progress Notes (Signed)
Physical Therapy Session Note  Patient Details  Name: Cody Arellano MRN: [id number] Date of Birth: 02/12/1946  Today's Date: 05/31/2020 PT Individual Time: 1300-1343 PT Individual Time Calculation (min): 43 min   Short Term Goals: Week 1:  PT Short Term Goal 1 (Week 1): pt to demonstrate supine<>sit mod I PT Short Term Goal 2 (Week 1): pt to demonstrate functional transfers CGA PT Short Term Goal 3 (Week 1): pt to demonstrate ambulation with LRAD at community distances at [facility] PT Short Term Goal 4 (Week 1): pt to demonstrate dynamic standing balance at [facility]  Skilled Therapeutici Interventions/Progress Updates:    Patient in supine with wife present for caregiver education.  Inititiated education on supervision level of assistance.  Patient requesting to toilet.  Supine to sit with S and donned shoes with S.  Sit to stand S and ambulated to bathroom, standing to void in toilet with S.  Patient ambulated with S using RW x 200' with wife present and education ongoing for safety and use of RW, etc.  Patient performed car transfer with S and mod cues for technique with wife present and verbalizing understanding of technique.  Patient ambulated with wife providing S level assist to general gym.  Negotiated 4 steps up and down x 4 reps (16) as wife reports to bedroom is 15 steps.  Patient used R rail only to simulate steps at home.  Wife assisting with CGA/S level of assist x 8 steps after demonstration by PT x first 8 steps.  Patient seated on mat for demonstration of fall recovery and discussion regarding fall prevention and safety if he does fall at home.  Wife present and both verbalized understanding with pt not feeling like he would like to practice.  Patient performed therex for LE strength to include sit<>stand x 5, standing hip flexion, heel raises, hip abduction and extension all x 10 with UE support on walker then on side of parallel bars.  Educated wife and pt these would be issued for HEP and to  use counter top for support.  Patient ambulated x 200' to room with RW and S of wife.  Sit to supine in room with S.  Left with wife present and bed alarm activated.    Therapy Documentation Precautions:  Precautions Precautions: Fall Precaution Comments: skin integrity- pressure sore on buttocks, restricted RUE Restrictions Weight Bearing Restrictions: Yes RUE Weight Bearing: Weight bearing as tolerated Other Position/Activity Restrictions: R impella Pain: Pain Assessment Pain Score: 0-No pain    Therapy/Group: Individual Therapy  Elray Mcgregor  [place], [place] 05/31/2020, 4:04 PM

## 2020-06-01 ENCOUNTER — Inpatient Hospital Stay (HOSPITAL_COMMUNITY): Payer: No Typology Code available for payment source

## 2020-06-01 DIAGNOSIS — I5043 Acute on chronic combined systolic (congestive) and diastolic (congestive) heart failure: Secondary | ICD-10-CM

## 2020-06-01 DIAGNOSIS — I469 Cardiac arrest, cause unspecified: Principal | ICD-10-CM

## 2020-06-01 LAB — GLUCOSE, CAPILLARY
Glucose-Capillary: 102 mg/dL — ABNORMAL HIGH (ref 70–99)
Glucose-Capillary: 119 mg/dL — ABNORMAL HIGH (ref 70–99)
Glucose-Capillary: 119 mg/dL — ABNORMAL HIGH (ref 70–99)
Glucose-Capillary: 125 mg/dL — ABNORMAL HIGH (ref 70–99)
Glucose-Capillary: 137 mg/dL — ABNORMAL HIGH (ref 70–99)
Glucose-Capillary: 92 mg/dL (ref 70–99)

## 2020-06-01 LAB — LACTIC ACID, PLASMA: Lactic Acid, Venous: 2.5 mmol/L (ref 0.5–1.9)

## 2020-06-01 LAB — BASIC METABOLIC PANEL
Anion gap: 11 (ref 5–15)
BUN: 37 mg/dL — ABNORMAL HIGH (ref 8–23)
CO2: 28 mmol/L (ref 22–32)
Calcium: 9 mg/dL (ref 8.9–10.3)
Chloride: 100 mmol/L (ref 98–111)
Creatinine, Ser: 1.43 mg/dL — ABNORMAL HIGH (ref 0.61–1.24)
GFR, Estimated: 51 mL/min — ABNORMAL LOW (ref >60)
Glucose, Bld: 146 mg/dL — ABNORMAL HIGH (ref 70–99)
Potassium: 4.9 mmol/L (ref 3.5–5.1)
Sodium: 139 mmol/L (ref 135–145)

## 2020-06-01 LAB — CBC
HCT: 33.7 % — ABNORMAL LOW (ref 39.0–52.0)
Hemoglobin: 9.7 g/dL — ABNORMAL LOW (ref 13.0–17.0)
MCH: 26.6 pg (ref 26.0–34.0)
MCHC: 28.8 g/dL — ABNORMAL LOW (ref 30.0–36.0)
MCV: 92.3 fL (ref 80.0–100.0)
Platelets: 301 10*3/uL (ref 150–400)
RBC: 3.65 MIL/uL — ABNORMAL LOW (ref 4.22–5.81)
RDW: 20.3 % — ABNORMAL HIGH (ref 11.5–15.5)
WBC: 8.6 10*3/uL (ref 4.0–10.5)
nRBC: 0.2 % (ref 0.0–0.2)

## 2020-06-01 LAB — POCT I-STAT 7, (LYTES, BLD GAS, ICA,H+H)
Acid-Base Excess: 7 mmol/L — ABNORMAL HIGH (ref 0.0–2.0)
Bicarbonate: 30.9 mmol/L — ABNORMAL HIGH (ref 20.0–28.0)
Calcium, Ion: 1.19 mmol/L (ref 1.15–1.40)
HCT: 34 % — ABNORMAL LOW (ref 39.0–52.0)
Hemoglobin: 11.6 g/dL — ABNORMAL LOW (ref 13.0–17.0)
O2 Saturation: 100 %
Patient temperature: 98
Potassium: 4.7 mmol/L (ref 3.5–5.1)
Sodium: 139 mmol/L (ref 135–145)
TCO2: 32 mmol/L (ref 22–32)
pCO2 arterial: 41.9 mmHg (ref 32.0–48.0)
pH, Arterial: 7.475 — ABNORMAL HIGH (ref 7.350–7.450)
pO2, Arterial: 317 mmHg — ABNORMAL HIGH (ref 83.0–108.0)

## 2020-06-01 LAB — HEPATIC FUNCTION PANEL
ALT: 96 U/L — ABNORMAL HIGH (ref 0–44)
AST: 99 U/L — ABNORMAL HIGH (ref 15–41)
Albumin: 2.7 g/dL — ABNORMAL LOW (ref 3.5–5.0)
Alkaline Phosphatase: 61 U/L (ref 38–126)
Bilirubin, Direct: 0.3 mg/dL — ABNORMAL HIGH (ref 0.0–0.2)
Indirect Bilirubin: 0.6 mg/dL (ref 0.3–0.9)
Total Bilirubin: 0.9 mg/dL (ref 0.3–1.2)
Total Protein: 6.6 g/dL (ref 6.5–8.1)

## 2020-06-01 LAB — TROPONIN I (HIGH SENSITIVITY)
Troponin I (High Sensitivity): 190 ng/L
Troponin I (High Sensitivity): 151 ng/L (ref <18)

## 2020-06-01 LAB — URINALYSIS, ROUTINE W REFLEX MICROSCOPIC
Bilirubin Urine: NEGATIVE
Glucose, UA: >=500 mg/dL — AB
Ketones, ur: NEGATIVE mg/dL
Leukocytes,Ua: NEGATIVE
Nitrite: NEGATIVE
Protein, ur: 100 mg/dL — AB
RBC / HPF: >50 RBC/hpf — ABNORMAL HIGH (ref 0–5)
Specific Gravity, Urine: 1.021 (ref 1.005–1.030)
pH: 5 (ref 5.0–8.0)

## 2020-06-01 LAB — COOXEMETRY PANEL
Carboxyhemoglobin: 1.3 % (ref 0.5–1.5)
Methemoglobin: 1 % (ref 0.0–1.5)
O2 Saturation: 70.9 %
Total hemoglobin: 9.9 g/dL — ABNORMAL LOW (ref 12.0–16.0)

## 2020-06-01 LAB — MAGNESIUM
Magnesium: 2.2 mg/dL (ref 1.7–2.4)
Magnesium: 2.4 mg/dL (ref 1.7–2.4)

## 2020-06-01 LAB — LIDOCAINE LEVEL: Lidocaine Lvl: 2.5 ug/mL (ref 1.5–5.0)

## 2020-06-01 LAB — PHOSPHORUS
Phosphorus: 4.9 mg/dL — ABNORMAL HIGH (ref 2.5–4.6)
Phosphorus: 4.4 mg/dL (ref 2.5–4.6)

## 2020-06-01 LAB — TRIGLYCERIDES: Triglycerides: 33 mg/dL (ref <150)

## 2020-06-01 MED ORDER — DOCUSATE SODIUM 50 MG/5ML PO LIQD: 100.0000 mg | Freq: Two times a day (BID) | ORAL | Status: DC

## 2020-06-01 MED ORDER — MEXILETINE HCL 150 MG PO CAPS
150.0000 mg | ORAL_CAPSULE | Freq: Two times a day (BID) | ORAL | Status: DC
  Filled 2020-06-01: qty 1

## 2020-06-01 MED ORDER — PANTOPRAZOLE SODIUM 40 MG PO PACK: 40.0000 mg | PACK | Freq: Every day | ORAL | Status: DC

## 2020-06-01 MED ORDER — VITAL 1.5 CAL PO LIQD
1000.0000 mL | ORAL | Status: DC
  Administered 2020-06-01 – 2020-06-07 (×5): 1000 mL
  Filled 2020-06-01 (×7): qty 1000

## 2020-06-01 MED ORDER — APIXABAN 5 MG PO TABS
5.0000 mg | ORAL_TABLET | Freq: Two times a day (BID) | ORAL | Status: DC
  Administered 2020-06-01 – 2020-06-04 (×6): 5 mg
  Filled 2020-06-01 (×6): qty 1

## 2020-06-01 MED ORDER — PANTOPRAZOLE SODIUM 40 MG PO PACK
40.0000 mg | PACK | Freq: Every day | ORAL | Status: DC
  Administered 2020-06-01 – 2020-06-04 (×4): 40 mg
  Filled 2020-06-01 (×4): qty 20

## 2020-06-01 MED ORDER — ROSUVASTATIN CALCIUM 20 MG PO TABS
20.0000 mg | ORAL_TABLET | Freq: Every day | ORAL | Status: DC
  Administered 2020-06-01 – 2020-06-04 (×4): 20 mg
  Filled 2020-06-01 (×4): qty 1

## 2020-06-01 MED ORDER — MIDODRINE HCL 5 MG PO TABS: 2.5000 mg | ORAL_TABLET | Freq: Three times a day (TID) | ORAL | Status: DC

## 2020-06-01 MED ORDER — POLYETHYLENE GLYCOL 3350 17 G PO PACK
17.0000 g | PACK | Freq: Every day | ORAL | Status: DC
  Administered 2020-06-02 – 2020-06-07 (×3): 17 g via ORAL
  Filled 2020-06-01 (×5): qty 1

## 2020-06-01 MED ORDER — ROSUVASTATIN CALCIUM 20 MG PO TABS: 20.0000 mg | ORAL_TABLET | Freq: Every day | ORAL | Status: DC

## 2020-06-01 MED ORDER — MEXILETINE HCL 150 MG PO CAPS
150.0000 mg | ORAL_CAPSULE | Freq: Two times a day (BID) | ORAL | Status: DC
  Administered 2020-06-01 – 2020-06-04 (×6): 150 mg
  Filled 2020-06-01 (×8): qty 1

## 2020-06-01 MED ORDER — DOCUSATE SODIUM 50 MG/5ML PO LIQD
100.0000 mg | Freq: Two times a day (BID) | ORAL | Status: DC
  Administered 2020-06-01 – 2020-06-04 (×7): 100 mg
  Filled 2020-06-01 (×7): qty 10

## 2020-06-01 MED ORDER — PROSOURCE TF PO LIQD
45.0000 mL | Freq: Three times a day (TID) | ORAL | Status: DC
  Administered 2020-06-01 – 2020-06-07 (×17): 45 mL
  Filled 2020-06-01 (×19): qty 45

## 2020-06-01 MED ORDER — APIXABAN 5 MG PO TABS: 5.0000 mg | ORAL_TABLET | Freq: Two times a day (BID) | ORAL | Status: DC

## 2020-06-01 NOTE — Progress Notes (Signed)
vLTM EEG completed,No skin breakdown

## 2020-06-01 NOTE — Procedures (Addendum)
Patient Name: Cody Arellano  MRN: [id number]  Epilepsy Attending: Charlsie Quest  Referring Physician/Provider: Dr Lynnell Catalan Duration: 05/31/2020 1712 to 06/01/2020 1048  Patient history: 74yo m s/p cardiac arrest. EEG to evaluate for seizure  Level of alertness:  Awake/lethargic  AEDs during EEG study: propofol  Technical aspects: This EEG study was done with scalp electrodes positioned according to the 10-20 International system of electrode placement. Electrical activity was acquired at a sampling rate of 500Hz  and reviewed with a high frequency filter of 70Hz  and a low frequency filter of 1Hz . EEG data were recorded continuously and digitally stored.   Description: No posterior dominant rhythm was seen. EEG showed continuous generalized 6-7 Hz theta as well as intermittent 2-3Hz  delta slowing. Hyperventilation and photic stimulation were not performed.     ABNORMALITY -Continuous slow, generalized  IMPRESSION: This study is suggestive of moderate diffuse encephalopathy, nonspecific etiology. No seizures or epileptiform discharges were seen throughout the recording.  Riki Gehring 

## 2020-06-01 NOTE — Progress Notes (Signed)
Occupational Therapy Session Note  Patient Details  Name: Cody Arellano MRN: [id number] Date of Birth: 29-Jul-1945  Today's Date: 05/31/2020 OT Individual Time: 7654-6503 OT Individual Time Calculation (min): 47 min  and Today's Date: 05/31/2020 OT Missed Time: 28 Minutes Missed Time Reason: Nursing care  Short Term Goals: Week 1:  OT Short Term Goal 1 (Week 1): STGs=LTGs due to ELOS  Skilled Therapeutic Interventions/Progress Updates:    Pt greeted at time of session supine in bed resting with wife present for family education session, IV running and pt reported 2/10 on fatigue scale. Agreeable to ADL for wife supervision and training, bed mobility supine to sit Supervision and ambulated to sink level in same manner. Doffed clothing with CGA/close supervision with cues for sequencing sitting/standing and pt ed on energy conservation throughout session. UB bathing set up and LB bathing supervision/CGA with standing at sink level for periarea and buttocks. Pt did need to use the bathroom, ambulated no AD with assist to manage IV pole to/from bathroom and urinated in standing. Ambulated back to wheelchair in same manner, Cardiology entered room to inspect sutures. OT stepping out of patient's room with wife, discussed techniques to keep the pt motivated at home to participate in therapy, decrease time in bed, and techniques to prevent bed sores. When re-entered the room, pt found unresponsive in wheelchair despite sternal rub with labored breathing and distress call initiated. Dependent transfer x3 wheelchair > bed. Hand off to nursing staff.   Therapy Documentation Precautions:  Precautions Precautions: Fall Precaution Comments: skin integrity- pressure sore on buttocks, restricted RUE Restrictions Weight Bearing Restrictions: Yes RUE Weight Bearing: Weight bearing as tolerated Other Position/Activity Restrictions: R impella     Therapy/Group: Individual Therapy  [name] 06/01/2020, 12:09 PM

## 2020-06-01 NOTE — Progress Notes (Addendum)
NAME:  Cody Arellano, MRN:  [id number], DOB:  18-Oct-1945, LOS: 1 ADMISSION DATE:  05/31/2020, CONSULTATION DATE: 05/31/2020 REFERRING MD: [facility], CHIEF COMPLAINT: Post cardiac arrest  HPI/course in hospital   74 year old man with advanced ischemic cardiomyopathy who was awaiting discharge prior to LVAD implantation.  Had been doing well and had participated in physiotherapy this morning.  Found unresponsive and not breathing.  CPR initiated and regained pulse following 1 dose of epinephrine.  Patient intubated and brought to ICU.  Initially admitted late November in cardiogenic shock underwent Impella implantation which was eventually extracted 12/13.  Had been in rehabilitation in anticipation of LVAD implantation.  Past Medical History   Past Medical History:  Diagnosis Date  . Anxiety   . CHF (congestive heart failure) ([place])   . Chronic tension headache   . Colon polyps   . Coronary artery disease 2008/2009   MI with PCI x 2, then PCI x 1 in 2009  . Depression   . Dyslipidemia   . Dysrhythmia    atrial fibrillation  . Hyperlipidemia   . Hypertension   . Lupus [facility])    sees Dr Trudie Reed  . Lupus disease of the lung   . 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 Myocardial infarction ([place]) 2008  . 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 Shortness of breath      Past Surgical History:  Procedure Laterality Date  . BRONCHIAL WASHINGS  04/29/2020   Procedure: BRONCHIAL WASHINGS;  Surgeon: Garner Nash, DO;  Location: [place] ENDOSCOPY;  Service: Pulmonary;;  . CARDIAC CATHETERIZATION    . CLIPPING OF ATRIAL APPENDAGE N/A 10/17/2019   Procedure: Clipping Of Atrial Appendage using AtriCure [id number] 45 MM AtriClip.;  Surgeon: Rexene Alberts, MD;  Location: [place];  Service: Open Heart Surgery;  Laterality: N/A;  . CORONARY ARTERY BYPASS GRAFT N/A 10/17/2019   Procedure: CORONARY ARTERY BYPASS GRAFTING (CABG) using LIMA to LAD;  Endoscopic harvest right greater saphenous vein: SVG to RAMUS.;  Surgeon: Rexene Alberts, MD;  Location: [place];  Service: Open Heart Surgery;  Laterality: N/A;  . CORONARY BALLOON ANGIOPLASTY N/A 10/17/2019   Procedure: CORONARY BALLOON ANGIOPLASTY;  Surgeon: Nelva Bush, MD;  Location: [place] CV LAB;  Service: Cardiovascular;  Laterality: N/A;  . coronary stents  2009  . CORONARY/GRAFT ACUTE MI REVASCULARIZATION N/A 10/17/2019   Procedure: Coronary/Graft Acute MI Revascularization;  Surgeon: Nelva Bush, MD;  Location: [place] CV LAB;  Service: Cardiovascular;  Laterality: N/A;  . CORONARY/GRAFT ANGIOGRAPHY N/A 05/04/2020   Procedure: CORONARY/GRAFT ANGIOGRAPHY;  Surgeon: Larey Dresser, MD;  Location: [place] CV LAB;  Service: Cardiovascular;  Laterality: N/A;  . ENDOVEIN HARVEST OF GREATER SAPHENOUS VEIN Right 10/17/2019   Procedure: [place] Of Greater Saphenous Vein;  Surgeon: Rexene Alberts, MD;  Location: [place];  Service: Open Heart Surgery;  Laterality: Right;  . IABP INSERTION N/A 10/17/2019   Procedure: IABP Insertion;  Surgeon: Nelva Bush, MD;  Location: [place] CV LAB;  Service: Cardiovascular;  Laterality: N/A;  . INCISION AND DRAINAGE ABSCESS N/A 01/29/2020   Procedure: INCISION AND DRAINAGE BILATERAL PERIRECTAL ABSCESS;  Surgeon: Stark Klein, MD;  Location: [place];  Service: General;  Laterality: N/A;  . IR THORACENTESIS ASP PLEURAL SPACE W/IMG GUIDE  10/26/2019  . IR THORACENTESIS ASP PLEURAL SPACE W/IMG GUIDE  04/27/2020  . LEFT HEART CATH AND CORS/GRAFTS ANGIOGRAPHY N/A  05/04/2020   Procedure: LEFT HEART CATH AND CORS/GRAFTS ANGIOGRAPHY;  Surgeon: Larey Dresser, MD;  Location: [place] CV LAB;  Service: Cardiovascular;  Laterality: N/A;  . PLACEMENT OF IMPELLA LEFT VENTRICULAR ASSIST DEVICE Right 05/03/2020   Procedure: PLACEMENT OF IMPELLA 5.5 LEFT VENTRICULAR ASSIST DEVICE VIA RIGHT AXILLARY;  Surgeon: [name], MD;   Location: [place];  Service: Open Heart Surgery;  Laterality: Right;  . REMOVAL OF IMPELLA LEFT VENTRICULAR ASSIST DEVICE N/A 05/16/2020   Procedure: REMOVAL OF IMPELLA LEFT VENTRICULAR ASSIST DEVICE;  Surgeon: Ivin Poot, MD;  Location: [place];  Service: Open Heart Surgery;  Laterality: N/A;  . RIGHT/LEFT HEART CATH AND CORONARY ANGIOGRAPHY N/A 10/17/2019   Procedure: RIGHT/LEFT HEART CATH AND CORONARY ANGIOGRAPHY;  Surgeon: Nelva Bush, MD;  Location: [place] CV LAB;  Service: Cardiovascular;  Laterality: N/A;  . TEE WITHOUT CARDIOVERSION  10/17/2019   Procedure: Transesophageal Echocardiogram (Tee);  Surgeon: Rexene Alberts, MD;  Location: [facility] OR;  Service: Open Heart Surgery;;  . TEE WITHOUT CARDIOVERSION N/A 05/03/2020   Procedure: TRANSESOPHAGEAL ECHOCARDIOGRAM (TEE);  Surgeon: [name], MD;  Location: [place];  Service: Open Heart Surgery;  Laterality: N/A;  . TEE WITHOUT CARDIOVERSION N/A 05/06/2020   Procedure: TRANSESOPHAGEAL ECHOCARDIOGRAM (TEE);  Surgeon: Larey Dresser, MD;  Location: [facility] ENDOSCOPY;  Service: Cardiovascular;  Laterality: N/A;  bedside  . VIDEO BRONCHOSCOPY N/A 04/29/2020   Procedure: VIDEO BRONCHOSCOPY WITHOUT FLUORO;  Surgeon: Garner Nash, DO;  Location: [place];  Service: Pulmonary;  Laterality: N/A;      Interim history/subjective:  Awake and now following commands.  Objective   Blood pressure (!) 150/56, pulse 66, temperature 99.68 F (37.6 C), resp. rate 15, height 6' (1.829 m), weight 58.6 kg, SpO2 100 %. CVP:  [4 mmHg-10 mmHg] 10 mmHg  Vent Mode: PRVC FiO2 (%):  [40 %-100 %] 40 % Set Rate:  [16 bmp] 16 bmp Vt Set:  [620 mL] 620 mL PEEP:  [5 cmH20] 5 cmH20 Plateau Pressure:  [13 cmH20-16 cmH20] 13 cmH20   Intake/Output Summary (Last 24 hours) at 06/01/2020 1039 Last data filed at 06/01/2020 1000 Gross per 24 hour  Intake 1057.62 ml  Output 835 ml  Net 222.62 ml   Filed Weights   05/31/20 1630 06/01/20 0418  Weight:  58.4 kg 58.6 kg    Examination:  Physical Exam Constitutional:      Appearance: He is cachectic.     Interventions: He is intubated.  HENT:     Head:     Comments: Orally intubated Cardiovascular:     Rate and Rhythm: Normal rate and regular rhythm.     Heart sounds: S1 normal and S2 normal.  2/6 holosystolic murmur.  LV heave. Pulmonary:     Effort: He is intubated.  Tolerated 5/530-minute SBT    Breath sounds: Normal breath sounds.     Comments: Chest clear Abdominal:     General: Abdomen is flat.     Palpations: Abdomen is soft.  Musculoskeletal:     Right lower leg: No edema.     Left lower leg: No edema.  Neurological:     Mental Status: He is awake and following commands with no focal deficits    Ancillary tests (personally reviewed)  CBC: Recent Labs  Lab 05/30/20 0407 05/31/20 1533 05/31/20 2005 05/31/20 2023 06/01/20 0341 06/01/20 0350  WBC 6.0 9.0 12.2*  --  8.6  --   NEUTROABS 3.7 4.4  --   --   --   --  HGB 9.3* 10.3* 10.2* 11.9* 9.7* 11.6*  HCT 31.7* 35.6* 34.3* 35.0* 33.7* 34.0*  MCV 92.7 92.2 91.7  --  92.3  --   PLT 280 332 318  --  301  --     Basic Metabolic Panel: Recent Labs  Lab 05/29/20 0435 05/31/20 0358 05/31/20 1533 05/31/20 1539 05/31/20 2005 05/31/20 2023 06/01/20 0341 06/01/20 0350  NA 139 140 137  --  138 138 139 139  K 4.3 4.2 4.0  --  3.6 3.5 4.9 4.7  CL 98 100 95*  --  98  --  100  --   CO2 31 31 21*  --  24  --  28  --   GLUCOSE 98 84 150*  --  143*  --  146*  --   BUN 36* 33* 35*  --  37*  --  37*  --   CREATININE 1.28* 1.21 1.57*  --  1.63*  --  1.43*  --   CALCIUM 8.7* 8.9 8.9  --  8.8*  --  9.0  --   MG  --   --  2.4  --  2.3  --  2.2  --   PHOS  --   --   --  5.2* 5.2*  --  4.9*  --    GFR: Estimated Creatinine Clearance: 37.6 mL/min (A) (by C-G formula based on SCr of 1.43 mg/dL (H)). Recent Labs  Lab 05/30/20 0407 05/31/20 1533 05/31/20 1534 05/31/20 1830 05/31/20 2005 05/31/20 2243 06/01/20 0341   WBC 6.0 9.0  --   --  12.2*  --  8.6  LATICACIDVEN  --   --    < > 3.9* 5.6* 4.6* 2.5*   < > = values in this interval not displayed.    Liver Function Tests: Recent Labs  Lab 05/31/20 1533 06/01/20 0341  AST 115* 99*  ALT 86* 96*  ALKPHOS 72 61  BILITOT 1.1 0.9  PROT 7.0 6.6  ALBUMIN 2.9* 2.7*   No results for input(s): LIPASE, AMYLASE in the last 168 hours. No results for input(s): AMMONIA in the last 168 hours.  ABG    Component Value Date/Time   PHART 7.475 (H) 06/01/2020 0350   PCO2ART 41.9 06/01/2020 0350   PO2ART 317 (H) 06/01/2020 0350   HCO3 30.9 (H) 06/01/2020 0350   TCO2 32 06/01/2020 0350   ACIDBASEDEF 1.0 05/03/2020 1334   O2SAT 70.9 06/01/2020 0350   O2SAT 100.0 06/01/2020 0350     Coagulation Profile: No results for input(s): INR, PROTIME in the last 168 hours.  Cardiac Enzymes: No results for input(s): CKTOTAL, CKMB, CKMBINDEX, TROPONINI in the last 168 hours.  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 Resume baseline heart failure therapy Optimize nutrition We will consider LVAD implantation prior to discharge  Daily Goals Checklist  Pain/Anxiety/Delirium protocol (if indicated): Discontinue sedative infusions VAP protocol (if indicated):  Bundle Respiratory support goals: Extubate Blood pressure target: Keep MAP Grant 65, SCV O2 greater than 60 DVT prophylaxis: IV heparin if CT head negative Nutritional status and feeding goals: Severe malnutrition.  Initiate full dose tube feeds GI prophylaxis: Pantoprazole Fluid status goals: Allow autoregulation Urinary catheter: Assessment of intravascular volume and Continuous temperature monitoring Central lines: None Glucose control: Phase 1 glycemic protocol Mobility/therapy needs: Bedrest Antibiotic de-escalation: No antibiotic Home medication reconciliation: On Daily labs: CMP, CBC Code Status: Full code Family Communication: Updated by heart failure service Disposition: ICU  CRITICAL CARE Performed by: Kipp Brood   Total critical care time: 40 minutes  Critical care time was exclusive of separately billable procedures and treating other patients.  Critical care was necessary to treat or prevent imminent or life-threatening deterioration.  Critical care was time spent personally by me on the following activities: development of treatment plan with patient and/or surrogate as well as nursing, discussions with consultants, evaluation of patient's response to treatment, examination of patient, obtaining history from patient or surrogate, ordering and performing treatments and interventions, ordering and review of laboratory studies, ordering and review of radiographic studies, pulse oximetry, re-evaluation of patient's condition and participation in multidisciplinary rounds.  Kipp Brood, MD [facility] ICU Physician [place]  Pager: [phone] Mobile: [phone] After hours: [phone].   06/01/2020, 10:39 AM

## 2020-06-01 NOTE — Progress Notes (Signed)
RT found patient on wean. Patient RR low 8-9 bpm. Tidal volumes 700-900. Will continue to monitor.

## 2020-06-01 NOTE — Progress Notes (Signed)
   Blood leaking around foley/ hematuria.  Stopped heparin IV (just restarted last evening)  Donato Schultz, MD

## 2020-06-01 NOTE — Progress Notes (Signed)
Patient ID: Cody Arellano, male   DOB: 1945/12/21, 74 y.o.   MRN: [id number]     Advanced Heart Failure Rounding Note  PCP-Cardiologist: Mertie Moores, MD   Subjective:    37/29: Asystolic arrest in CIR, ROSC with 1 dose lidocaine and transferred to [place].  On 2H, had episode of torsades/PMVT preceded by R on T phenomenon in setting of prolonged QT interval.  Terminated by 1 shock.   This morning, in NSR with occasional PVCs.  ECG with old inferior MI and QTc still long at 506 (but better than yesterday).   Mild increase in HS-TnI peak 190 with arrests. Lactate coming down post arrest, 4.6 => 2.5. Creatinine up to 1.6 yesterday now trending down 1.4.   He is awake on vent, following commands. Head CT no acute.  Co-ox 71% on milrinone 0.125, CVP 9-10.    Objective:   Weight Range: 58.6 kg Body mass index is 17.52 kg/m.   Vital Signs:   Temp:  [97.7 F (36.5 C)-99.5 F (37.5 C)] 99.5 F (37.5 C) (12/29 0700) Pulse Rate:  [50-75] 55 (12/29 0700) Resp:  [9-22] 16 (12/29 0700) BP: (82-173)/(29-78) 130/70 (12/29 0700) SpO2:  [100 %] 100 % (12/29 0700) FiO2 (%):  [40 %-100 %] 40 % (12/29 0412) Weight:  [58.4 kg-58.6 kg] 58.6 kg (12/29 0418) Last BM Date: 05/30/20  Weight change: Filed Weights   05/31/20 1630 06/01/20 0418  Weight: 58.4 kg 58.6 kg    Intake/Output:   Intake/Output Summary (Last 24 hours) at 06/01/2020 0753 Last data filed at 06/01/2020 0557 Gross per 24 hour  Intake 946.85 ml  Output 665 ml  Net 281.85 ml      Physical Exam    General:  Awake on vent HEENT: Normal Neck: Supple. JVP 8 cm. Carotids 2+ bilat; no bruits. No lymphadenopathy or thyromegaly appreciated. Cor: PMI lateral. Regular rate & rhythm. No rubs, gallops or murmurs. Lungs: Clear Abdomen: Soft, nontender, nondistended. No hepatosplenomegaly. No bruits or masses. Good bowel sounds. Extremities: No cyanosis, clubbing, rash, edema Neuro: Alert & orientedx3, cranial nerves grossly intact.  moves all 4 extremities w/o difficulty. Affect pleasant   Telemetry   NSR with PVCs in 50s (personally reviewed)  EKG    NSR with left axis deviation, old inferior MI, QTc 506 (personally reviewed)  Labs    CBC Recent Labs    05/30/20 0407 05/31/20 1533 05/31/20 2005 05/31/20 2023 06/01/20 0341 06/01/20 0350  WBC 6.0 9.0 12.2*  --  8.6  --   NEUTROABS 3.7 4.4  --   --   --   --   HGB 9.3* 10.3* 10.2*   < > 9.7* 11.6*  HCT 31.7* 35.6* 34.3*   < > 33.7* 34.0*  MCV 92.7 92.2 91.7  --  92.3  --   PLT 280 332 318  --  301  --    < > = values in this interval not displayed.   Basic Metabolic Panel Recent Labs    05/31/20 2005 05/31/20 2023 06/01/20 0341 06/01/20 0350  NA 138   < > 139 139  K 3.6   < > 4.9 4.7  CL 98  --  100  --   CO2 24  --  28  --   GLUCOSE 143*  --  146*  --   BUN 37*  --  37*  --   CREATININE 1.63*  --  1.43*  --   CALCIUM 8.8*  --  9.0  --  MG 2.3  --  2.2  --   PHOS 5.2*  --  4.9*  --    < > = values in this interval not displayed.   Liver Function Tests Recent Labs    05/31/20 1533 06/01/20 0341  AST 115* 99*  ALT 86* 96*  ALKPHOS 72 61  BILITOT 1.1 0.9  PROT 7.0 6.6  ALBUMIN 2.9* 2.7*   No results for input(s): LIPASE, AMYLASE in the last 72 hours. Cardiac Enzymes No results for input(s): CKTOTAL, CKMB, CKMBINDEX, TROPONINI in the last 72 hours.  BNP: BNP (last 3 results) Recent Labs    04/26/20 1319 04/30/20 0248 05/31/20 1533  BNP 1,824.4* 1,685.5* 1,995.6*    ProBNP (last 3 results) No results for input(s): PROBNP in the last 8760 hours.   D-Dimer No results for input(s): DDIMER in the last 72 hours. Hemoglobin A1C No results for input(s): HGBA1C in the last 72 hours. Fasting Lipid Panel Recent Labs    06/01/20 0341  TRIG 33   Thyroid Function Tests Recent Labs    05/31/20 1533  TSH 7.218*    Other results:   Imaging    CT HEAD WO CONTRAST  Result Date: 05/31/2020 CLINICAL DATA:  Cardiac  arrest EXAM: CT HEAD WITHOUT CONTRAST TECHNIQUE: Contiguous axial images were obtained from the base of the skull through the vertex without intravenous contrast. COMPARISON:  None. FINDINGS: Brain: No evidence of acute territorial infarction, hemorrhage, hydrocephalus,extra-axial collection or mass lesion/mass effect. There is dilatation the ventricles and sulci consistent with age-related atrophy. Low-attenuation changes in the deep white matter consistent with small vessel ischemia. Vascular: No hyperdense vessel or unexpected calcification. Skull: The skull is intact. No fracture or focal lesion identified. Sinuses/Orbits: The visualized paranasal sinuses and mastoid air cells are clear. The orbits and globes intact. Other: None IMPRESSION: No acute intracranial abnormality. Findings consistent with age related atrophy and chronic small vessel ischemia Electronically Signed   By: Prudencio Pair M.D.   On: 05/31/2020 23:22   DG CHEST PORT 1 VIEW  Result Date: 05/31/2020 CLINICAL DATA:  Cardiac arrest.  CPR. EXAM: PORTABLE CHEST 1 VIEW COMPARISON:  Chest x-ray 05/31/2020 3:46 p.m., CT chest 04/26/2020, chest x-ray 05/07/2020 FINDINGS: Right PICC with tip not well visualized overlying the thoracic vertebral bodies likely in the region of the superior vena cava. Endotracheal tube with tip approximately 5.5 cm above the carina. Enteric tube noted coursing with low diaphragm with tip and side port collimated off view. Atrial appendage clip noted. Suggestion of coronary bypass surgery. Cardiac paddles overlie the chest. The heart size and mediastinal contours are unchanged with cardiomegaly. Patchy bibasilar airspace opacities. No pulmonary edema. Blunting of the right costophrenic angle. Left, costophrenic angle collimated off view. No pneumothorax. No acute osseous abnormality. Dextroscoliosis of the lower thoracic spine with compensatory levoscoliosis of the upper thoracic. IMPRESSION: 1. Persistent patchy  bibasilar airspace opacities that could represent infection/inflammation. 2. Possible trace right pleural effusion. Electronically Signed   By: Iven Finn M.D.   On: 05/31/2020 20:21   DG CHEST PORT 1 VIEW  Result Date: 05/31/2020 CLINICAL DATA:  74 year old male with respiratory failure EXAM: PORTABLE CHEST 1 VIEW COMPARISON:  05/16/2020, 05/07/2020, 05/12/2020 FINDINGS: Cardiomediastinal silhouette unchanged with cardiomegaly. Surgical changes of prior left atrial amputation/clipping. Endotracheal tube terminates 5.5 cm above the carina. Gastric tube traverses the mediastinum and terminates in the stomach out of the field of view. Interval removal of weighted tip enteric feeding tube. Right upper extremity PICC is unchanged.  Surgical clips in the right chest wall. No pneumothorax. No pleural effusion. Reticulonodular opacities at the left lung base, unchanged from the comparison. Reticulonodular opacity at the right lung base in the costophrenic angle, residual from the prior opacities on plain film dated 05/07/2020. IMPRESSION: Endotracheal tube terminates suitably above the carina, 5.5 cm. Gastric tube terminates within the abdomen out of the field of view. Interval removal of the enteric feeding tube. Reticulonodular opacities at the bilateral lung bases, favored to be chronic as the appearance is significantly improved when compared to the plain film 05/07/2020. Electronically Signed   By: Corrie Mckusick D.O.   On: 05/31/2020 16:04   EEG adult  Result Date: 05/31/2020 [name], MD     05/31/2020 11:36 PM Patient Name: EMANUELLE BASTOS MRN: [id number] Epilepsy Attending: [name] Referring Physician/Provider: Date: 05/05/2020 Duration: 24.27mns Patient history: 89yom s/p cardiac arrest. EEG to evaluate for seizure Level of alertness:  Awake/lethargic AEDs during EEG study: propofol Technical aspects: This EEG study was done with scalp electrodes positioned according to the 10-20  International system of electrode placement. Electrical activity was acquired at a sampling rate of _0  and reviewed with a high frequency filter of _1  and a low frequency filter of _2 . EEG data were recorded continuously and digitally stored. Description: No posterior dominant rhythm was seen. EEG showed continuous generalized 3 to 6 Hz theta-delta slowing. Hyperventilation and photic stimulation were not performed.   ABNORMALITY -Continuous slow, generalized IMPRESSION: This study is suggestive of moderate diffuse encephalopathy, nonspecific etiology. No seizures or epileptiform discharges were seen throughout the recording. PLora Havens  ECHOCARDIOGRAM LIMITED  Result Date: 05/31/2020    ECHOCARDIOGRAM LIMITED REPORT   Patient Name:   LEWING FANDINOSIMS Date of Exam: 05/31/2020 Medical Rec #:  [id number]   Height:       72.0 in Accession #:    [id number]  Weight:       128.7 lb Date of Birth:  [birth year]   BSA:          1.767 m Patient Age:    89years     BP:           118/69 mmHg Patient Gender: M            HR:           64 bpm. Exam Location:  Inpatient Procedure: Limited Echo STAT ECHO Indications:    Cardiac arrest I46.9  History:        Patient has prior history of Echocardiogram examinations, most                 recent 05/27/2020. CAD and Previous Myocardial Infarction, Prior                 CABG, Signs/Symptoms:Shortness of Breath; Risk Factors:Former                 Smoker, Dyslipidemia and Hypertension.  Sonographer:    JVickie EpleyRDCS Referring Phys: 3Bear CreekComments: Echo performed with patient supine and on artificial respirator. IMPRESSIONS  1. Left ventricular ejection fraction, by estimation, is 25-30%. The left ventricle has moderately decreased function. Left ventricular diastolic parameters are consistent with Grade II diastolic dysfunction (pseudonormalization). Elevated left atrial pressure. There is akinesis and scarring of the left ventricular  anteroseptal wall, anterior wall and apical segment.  2. Right ventricular systolic function is mildly reduced. The right ventricular size  is moderately enlarged. There is mildly elevated pulmonary artery systolic pressure.  3. Left atrial size was mild to moderately dilated.  4. Moderate mitral valve regurgitation. Moderate mitral annular calcification.  5. Tricuspid valve regurgitation is mild to moderate.  6. Partial fusion of left-right coronary cusps. The aortic valve is tricuspid. Aortic valve regurgitation is mild. Mild aortic valve sclerosis is present, with no evidence of aortic valve stenosis. Comparison(s): No significant change from prior study. Prior images reviewed side by side. During the study, the rhythm changes from sinus to accelerated junctional rhythm. FINDINGS  Left Ventricle: No left ventricular thrombus is seen. Left ventricular ejection fraction, by estimation, is 25 to 30%. The left ventricle has severely decreased function. The left ventricle demonstrates regional wall motion abnormalities. Akinesis of the left ventricular, entire anteroseptal wall, anterior wall and apical segment. The left ventricular internal cavity size was mildly dilated. There is no left ventricular hypertrophy. Left ventricular diastolic parameters are consistent with Grade II diastolic dysfunction (pseudonormalization). Elevated left atrial pressure.  LV Wall Scoring: The mid and distal anterior wall, mid and distal anterior septum, and entire apex are akinetic. The basal anteroseptal segment and basal anterior segment are hypokinetic. The antero-lateral wall, inferior wall, posterior wall, mid inferoseptal segment, and basal inferoseptal segment are normal. Right Ventricle: The right ventricular size is moderately enlarged. No increase in right ventricular wall thickness. Right ventricular systolic function is mildly reduced. There is mildly elevated pulmonary artery systolic pressure. The tricuspid regurgitant  velocity is 2.77 m/s, and with an assumed right atrial pressure of 5 mmHg, the estimated right ventricular systolic pressure is 36.6 mmHg. Left Atrium: Left atrial size was mild to moderately dilated. Right Atrium: Right atrial size was normal in size. Pericardium: There is no evidence of pericardial effusion. Mitral Valve: There is mild thickening of the mitral valve leaflet(s). Moderate mitral annular calcification. Moderate mitral valve regurgitation, with centrally-directed jet. Tricuspid Valve: The tricuspid valve is normal in structure. Tricuspid valve regurgitation is mild to moderate. Aortic Valve: Partial fusion of left-right coronary cusps. The aortic valve is tricuspid. Aortic valve regurgitation is mild. Mild aortic valve sclerosis is present, with no evidence of aortic valve stenosis. Pulmonic Valve: The pulmonic valve was normal in structure. Pulmonic valve regurgitation is mild to moderate. Venous: IVC assessment for right atrial pressure unable to be performed due to mechanical ventilation. IAS/Shunts: There is right bowing of the interatrial septum, suggestive of elevated left atrial pressure. No atrial level shunt detected by color flow Doppler. LEFT VENTRICLE PLAX 2D LVOT diam:     2.10 cm LV SV:         51 LV SV Index:   29 LVOT Area:     3.46 cm  AORTIC VALVE LVOT Vmax:   77.70 cm/s LVOT Vmean:  54.400 cm/s LVOT VTI:    0.148 m  AORTA Ao Root diam: 3.50 cm TRICUSPID VALVE TR Peak grad:   30.7 mmHg TR Vmax:        277.00 cm/s  SHUNTS Systemic VTI:  0.15 m Systemic Diam: 2.10 cm Dani Gobble Croitoru MD Electronically signed by Sanda Klein MD Signature Date/Time: 05/31/2020/4:24:20 PM    Final       Medications:     Scheduled Medications: . chlorhexidine gluconate (MEDLINE KIT)  15 mL Mouth Rinse BID  . Chlorhexidine Gluconate Cloth  6 each Topical Daily  . docusate  100 mg Per Tube BID  . feeding supplement (PROSource TF)  45 mL Per Tube BID  .  feeding supplement (VITAL HIGH PROTEIN)   1,000 mL Per Tube Q24H  . mouth rinse  15 mL Mouth Rinse 10 times per day  . midodrine  2.5 mg Oral TID WC  . pantoprazole sodium  40 mg Per Tube Daily  . polyethylene glycol  17 g Per Tube Daily  . rosuvastatin  20 mg Oral Daily  . sodium chloride flush  3 mL Intravenous Q12H     Infusions: . sodium chloride    . fentaNYL infusion INTRAVENOUS 125 mcg/hr (06/01/20 0724)  . lidocaine 1 mg/min (06/01/20 0530)  . milrinone 0.125 mcg/kg/min (06/01/20 0530)  . norepinephrine (LEVOPHED) Adult infusion    . propofol (DIPRIVAN) infusion       PRN Medications:  sodium chloride, acetaminophen, fentaNYL, fentaNYL (SUBLIMAZE) injection, fentaNYL (SUBLIMAZE) injection, midazolam, midazolam, ondansetron (ZOFRAN) IV, sodium chloride flush, sodium chloride flush   Assessment/Plan   1. Cardiac arrest: Had VT episode 12/7, required DCCV.  00/17 had asystolic arrest in CIR required 1 dose epinephrine and brief CPR.  Torsades/PMVT later on 12/27 in setting of markedly prolonged QT interval and R on T phenomenon, lidocaine started. K and Mg normal.  ECG this morning with QTc prolonged 506 msec. Echo unchanged, head CT negative, HS-TnI mildly elevated to peak 190 in setting of arrests.  - Remains on lidocaine gtt today, will continue for now and check level this morning.  Possible wean soon to mexiletine.   2. Chronic systolic CHF/cardiogenic shock: Ischemic cardiomyopathy. Echo this admission looks worse than 10/21 with EF 20-25% with septal akinesis, mid to apical inferior akinesis, apical anterior and apex akinesis, moderately dilated and dysfunctional RV, moderate TR, severe MR, bicuspid aortic valve with no stenosis, mild aortic insufficiency. Progressive/end-stage CHF worsened by onset of atrial fibrillation. Cardiogenic shock 11/30 with co-ox down to 30%, Impella 5.5 placed to allow time to stabilize and consider further steps. Impella extracted 12/13. 0.125 milrinone added post explant for marginal  co-ox at 51%. Remains on milrinone 0.125. He has end-stage biventricular HF. He is not a transplant candidate. Barriers to LVAD include nutritional status/deconditioning and RV. I think RV would be acceptable. We discussed in [place], not LVAD candidate at this point with malnutrition/deconditioning. He went to CIR for aggressive PT, had improved nutritional and functional status and was going to go home but had arrest. Repeat echo post-arrest with EF 25%, mildly decreased RV function, moderate MR.  This morning, co-ox 71% with CVP 9-10 on vent.  - Continue milrinone 0.125. - Continue midodrine 2.5 tid.   - Restart digoxin if creatinine continues to trend down.  - Will restart his po torsemide 20 mg daily probably tomorrow if creatinine continues to trend down.  3. CAD: Prior history of MI with LAD and ramus PCI. AdmittedMay 2021with anterior STEMI. LHC with occluded ostial LAD (in-stent), 90% ostial ramus, 60-70% proximal LCx, nondominant RCA. PTCA to LAD and ramus to restore flow, then CABG with LIMA-LAD and SVG-ramus. No chest pain, doubt ACS. Fall in EF from 10/21 to 11/21, but he did not present with ACS. [place] 12/1 showed patient LIMA-LAD, patent native LAD, patent dominant LCx. The SVG-ramus is occluded with severe diffuse in-stent restenosis in proximal ramus. Ramus not intervened upon as this would be a complex intervention and unlikely to markedly improve EF. HS-TnI mildly elevated with cardiac arrest, suspect due to arrest and not ACS.   - Restart statin.  4.  AKI: Mild post-arrest, creatinine now trending down at 1.4.   5. Right pleural effusion:  S/p thoracentesis, transudative (CHF).  6. Atrial fibrillation/flutter with RVR: DCCV on 12/3, went back to NSR 12/7.In NSR currently.  Had hematuria overnight and heparin gtt stopped.  - Off amiodarone and on lidocaine with long QT.  - Restart apixaban tonight (give some time for hematuria to clear).  7. Mitral regurgitation: TEE 12/3 with  moderate central MR. Doubt MitraClip will help him much with moderate MR and he is likely too advanced.  8. H/o PE: In 10/21. Keep anticoagulated, on apixaban. No bleeding  9. Bicuspid aortic valve: Mild AI, no AS.  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 Hold Imuran and hydroxychloroquine.  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  12. Acute hypoxemic respiratory failure: Post-arrest, awake on vent this morning.  - Hopefully extubate today, per CCM.   CRITICAL CARE Performed by: Loralie Champagne  Total critical care time: 40 minutes  Critical care time was exclusive of separately billable procedures and treating other patients.  Critical care was necessary to treat or prevent imminent or life-threatening deterioration.  Critical care was time spent personally by me on the following activities: development of treatment plan with patient and/or surrogate as well as nursing, discussions with consultants, evaluation of patient's response to treatment, examination of patient, obtaining history from patient or surrogate, ordering and performing treatments and interventions, ordering and review of laboratory studies, ordering and review of radiographic studies, pulse oximetry and re-evaluation of patient's condition.   Length of Stay: 1  Loralie Champagne, MD  06/01/2020, 7:53 AM  Advanced Heart Failure Team Pager [phone] (M-F; 7a - 4p)  Please contact [facility] for night-coverage after hours (4p -7a ) and weekends on [email or web address]

## 2020-06-01 NOTE — Progress Notes (Signed)
LTM maint complete - no skin breakdown under:  FP1, FP2   

## 2020-06-01 NOTE — Progress Notes (Signed)
Initial Nutrition Assessment  DOCUMENTATION CODES:   Underweight,Severe malnutrition in context of chronic illness  INTERVENTION:   Tube Feeding via Cortrak:  Vital 1.5 at 60 ml/hr Pro-Source TF 45 mL TID Provides 2280 kcals, 130 g of protein, 1094 mL of free water Meets 100% estimated calorie and protein needs   NUTRITION DIAGNOSIS:   Severe Malnutrition related to chronic illness as evidenced by severe fat depletion,severe muscle depletion.  GOAL:   Patient will meet greater than or equal to 90% of their needs  MONITOR:   Diet advancement,TF tolerance,PO intake,Weight trends  REASON FOR ASSESSMENT:   Consult Enteral/tube feeding initiation and management  ASSESSMENT:   74 yo male admitted from CIR post arrest, pt was awaiting discharge prior to LVAD implantation secondary to advanced ischemic CM. PMH includs lupus, intersitial lung disease, CAD/CABG, CHF  11/30 Impella placed 12/03 Cortrak placed 12/13 Impella removed 12/14 Cortrak removed 12/28 Admit from CIR, cardiac arrest, Intubated 12/29 Cortak placed, Extubated  Currently NPO Pt drinking Ensure supplements well, eating some Magic Cup prior to arrest. PO intake variable  Pt remains underweight and malnourished. UBW back in October 155 pounds; current wt 129 pounds. Despite aggressive nutrition intervention via tube feeding and oral nurtition supplements, pt has been unable to gain any weight  Suspect that based on trajectory of care, if aggressive intervention is planned, pt would benefit from PEG tube placement  Labs: reviewed Meds: miralax, colace  NUTRITION - FOCUSED PHYSICAL EXAM:  Flowsheet Row Most Recent Value  Orbital Region Severe depletion  Upper Arm Region Severe depletion  Thoracic and Lumbar Region Severe depletion  Buccal Region Severe depletion  Temple Region Severe depletion  Clavicle Bone Region Severe depletion  Clavicle and Acromion Bone Region Severe depletion  Scapular Bone  Region Severe depletion  Dorsal Hand Moderate depletion  Patellar Region Severe depletion  Anterior Thigh Region Severe depletion  Posterior Calf Region Severe depletion       Diet Order:   Diet Order            Diet NPO time specified  Diet effective now                 EDUCATION NEEDS:   Education needs have been addressed  Skin:  Skin Integrity Issues:: Stage II Stage II: coccyx Other: skin tear to scrotum  Last BM:  12/27  Height:   Ht Readings from Last 1 Encounters:  05/31/20 6' (1.829 m)    Weight:   Wt Readings from Last 1 Encounters:  06/01/20 58.6 kg    BMI:  Body mass index is 17.52 kg/m.  Estimated Nutritional Needs:   Kcal:  2200-2400 kcals  Protein:  120-140 g  Fluid:  >/= 1.8 L    Romelle Starcher MS, RDN, LDN, CNSC Registered Dietitian III Clinical Nutrition RD Pager and On-Call Pager Number Located in [place]

## 2020-06-01 NOTE — Procedures (Signed)
Extubation Procedure Note  Patient Details:   Name: Cody Arellano DOB: 03-06-1946 MRN: [id number]   Airway Documentation:  Airway 7.5 mm (Active)  Secured at (cm) 25 cm 06/01/20 0807  Measured From Lips 06/01/20 0807  Secured Location Left 06/01/20 0807  Secured By [employer] 06/01/20 0807  Tube Holder Repositioned Yes 06/01/20 0807  Prone position No 06/01/20 0807  Cuff Pressure (cm H2O) 24 cm H2O 05/31/20 1951  Site Condition Dry 06/01/20 0807   Vent end date: (not recorded) Vent end time: (not recorded)   Evaluation  O2 sats: stable throughout Complications: No apparent complications Patient did tolerate procedure well. Bilateral Breath Sounds: Clear,Diminished   Yes, patient able to speak.  Patient extubated at 0924 per MD order. RN at bedside. Placed on 4L nasal cannula. Vitals stable. Vomiting after extubation.   Farris Has 06/01/2020, 9:31 AM

## 2020-06-01 NOTE — Procedures (Signed)
Cortrak  Tube Type:  Cortrak - 43 inches Tube Location:  Left nare Initial Placement:  Stomach Secured by: Bridle Technique Used to Measure Tube Placement:  Documented cm marking at nare/ corner of mouth Cortrak Secured At:  70 cm    Cortrak Tube Team Note:  Consult received to place a Cortrak feeding tube.   No x-ray is required. RN may begin using tube.   If the tube becomes dislodged please keep the tube and contact the Cortrak team at [email or web address] ([email or web address]) for replacement.  If after hours and replacement cannot be delayed, place a NG tube and confirm placement with an abdominal x-ray.    Betsey Holiday MS, RD, LDN Please refer to [facility] for RD and/or RD on-call/weekend/after hours pager

## 2020-06-02 LAB — BASIC METABOLIC PANEL
Anion gap: 12 (ref 5–15)
Anion gap: 11 (ref 5–15)
BUN: 42 mg/dL — ABNORMAL HIGH (ref 8–23)
BUN: 40 mg/dL — ABNORMAL HIGH (ref 8–23)
CO2: 26 mmol/L (ref 22–32)
CO2: 29 mmol/L (ref 22–32)
Calcium: 8.6 mg/dL — ABNORMAL LOW (ref 8.9–10.3)
Calcium: 8.5 mg/dL — ABNORMAL LOW (ref 8.9–10.3)
Chloride: 99 mmol/L (ref 98–111)
Chloride: 99 mmol/L (ref 98–111)
Creatinine, Ser: 1.19 mg/dL (ref 0.61–1.24)
Creatinine, Ser: 1.19 mg/dL (ref 0.61–1.24)
GFR, Estimated: >60 mL/min (ref >60)
GFR, Estimated: >60 mL/min (ref >60)
Glucose, Bld: 130 mg/dL — ABNORMAL HIGH (ref 70–99)
Glucose, Bld: 104 mg/dL — ABNORMAL HIGH (ref 70–99)
Potassium: 4.6 mmol/L (ref 3.5–5.1)
Potassium: 4.2 mmol/L (ref 3.5–5.1)
Sodium: 137 mmol/L (ref 135–145)
Sodium: 139 mmol/L (ref 135–145)

## 2020-06-02 LAB — COOXEMETRY PANEL
Carboxyhemoglobin: 1.2 % (ref 0.5–1.5)
Carboxyhemoglobin: 1.3 % (ref 0.5–1.5)
Carboxyhemoglobin: 1.4 % (ref 0.5–1.5)
Carboxyhemoglobin: 1.6 % — ABNORMAL HIGH (ref 0.5–1.5)
Methemoglobin: 0.9 % (ref 0.0–1.5)
Methemoglobin: 0.9 % (ref 0.0–1.5)
Methemoglobin: 1 % (ref 0.0–1.5)
Methemoglobin: 1 % (ref 0.0–1.5)
O2 Saturation: 37.4 %
O2 Saturation: 38.4 %
O2 Saturation: 41.6 %
O2 Saturation: 51.2 %
Total hemoglobin: 9.3 g/dL — ABNORMAL LOW (ref 12.0–16.0)
Total hemoglobin: 9.4 g/dL — ABNORMAL LOW (ref 12.0–16.0)
Total hemoglobin: 9.4 g/dL — ABNORMAL LOW (ref 12.0–16.0)
Total hemoglobin: 9.6 g/dL — ABNORMAL LOW (ref 12.0–16.0)

## 2020-06-02 LAB — PHOSPHORUS
Phosphorus: 4.4 mg/dL (ref 2.5–4.6)
Phosphorus: 3.3 mg/dL (ref 2.5–4.6)

## 2020-06-02 LAB — GLUCOSE, CAPILLARY
Glucose-Capillary: 118 mg/dL — ABNORMAL HIGH (ref 70–99)
Glucose-Capillary: 124 mg/dL — ABNORMAL HIGH (ref 70–99)
Glucose-Capillary: 106 mg/dL — ABNORMAL HIGH (ref 70–99)
Glucose-Capillary: 88 mg/dL (ref 70–99)
Glucose-Capillary: 106 mg/dL — ABNORMAL HIGH (ref 70–99)

## 2020-06-02 LAB — MAGNESIUM
Magnesium: 2.6 mg/dL — ABNORMAL HIGH (ref 1.7–2.4)
Magnesium: 2.4 mg/dL (ref 1.7–2.4)

## 2020-06-02 LAB — URINE CULTURE: Culture: <10000 — AB

## 2020-06-02 LAB — CBC
HCT: 30.1 % — ABNORMAL LOW (ref 39.0–52.0)
Hemoglobin: 9.1 g/dL — ABNORMAL LOW (ref 13.0–17.0)
MCH: 27.6 pg (ref 26.0–34.0)
MCHC: 30.2 g/dL (ref 30.0–36.0)
MCV: 91.2 fL (ref 80.0–100.0)
Platelets: 268 10*3/uL (ref 150–400)
RBC: 3.3 MIL/uL — ABNORMAL LOW (ref 4.22–5.81)
RDW: 20.8 % — ABNORMAL HIGH (ref 11.5–15.5)
WBC: 7.2 10*3/uL (ref 4.0–10.5)
nRBC: 0.6 % — ABNORMAL HIGH (ref 0.0–0.2)

## 2020-06-02 LAB — TRIGLYCERIDES: Triglycerides: 41 mg/dL (ref <150)

## 2020-06-02 MED ORDER — BISACODYL 10 MG RE SUPP
10.0000 mg | Freq: Once | RECTAL | Status: AC
  Administered 2020-06-02: 21:00:00 10 mg via RECTAL
  Filled 2020-06-02: qty 1

## 2020-06-02 MED ORDER — SPIRONOLACTONE 12.5 MG HALF TABLET
12.5000 mg | ORAL_TABLET | Freq: Every day | ORAL | Status: DC
  Administered 2020-06-02: 10:00:00 12.5 mg
  Filled 2020-06-02: qty 1

## 2020-06-02 MED ORDER — FUROSEMIDE 10 MG/ML IJ SOLN
80.0000 mg | Freq: Two times a day (BID) | INTRAMUSCULAR | Status: AC
  Administered 2020-06-02 (×2): 80 mg via INTRAVENOUS
  Filled 2020-06-02 (×2): qty 8

## 2020-06-02 MED ORDER — ASPIRIN 81 MG PO CHEW
81.0000 mg | CHEWABLE_TABLET | Freq: Every day | ORAL | Status: DC
  Administered 2020-06-02 – 2020-06-04 (×3): 81 mg
  Filled 2020-06-02 (×3): qty 1

## 2020-06-02 MED ORDER — POTASSIUM CHLORIDE CRYS ER 20 MEQ PO TBCR
20.0000 meq | EXTENDED_RELEASE_TABLET | Freq: Once | ORAL | Status: AC
  Administered 2020-06-02: 15:00:00 20 meq via ORAL
  Filled 2020-06-02: qty 1

## 2020-06-02 MED ORDER — SORBITOL 70 % SOLN
30.0000 mL | Freq: Once | Status: AC
  Administered 2020-06-02: 08:00:00 30 mL via ORAL
  Filled 2020-06-02: qty 30

## 2020-06-02 MED FILL — Medication: Qty: 1 | Status: AC

## 2020-06-02 NOTE — Evaluation (Signed)
Occupational Therapy Evaluation Patient Details Name: Cody Arellano MRN: [id number] DOB: 1945/07/12 Today's Date: 06/02/2020    History of Present Illness 74 year old man with advanced ischemic cardiomyopathy who was awaiting discharge prior to LVAD implantation.  Had been doing well and had participated in physiotherapy this morning.  Found unresponsive and not breathing.  CPR initiated and regained pulse following 1 dose of epinephrine.  Extubated in ICU.   Clinical Impression   Patient admitted from CIR due to diagnosis above.  Patient sating he was close to going home, and doing well.  Currently in the ICU.  Deficits impacting independence are listed below.  Unfortunately he has deconditioned being back in ICU, but OT to follow and hopefully regain advances made in CIR.  CIR is recommended if appropriate.      Follow Up Recommendations  CIR;Supervision/Assistance - 24 hour    Equipment Recommendations  3 in 1 bedside commode;Wheelchair (measurements OT);Wheelchair cushion (measurements OT)    Recommendations for Other Services       Precautions / Restrictions Precautions Precautions: Fall Precaution Comments: skin integrity- pressure sore on buttocks, restricted RUE Restrictions Weight Bearing Restrictions: No Other Position/Activity Restrictions: External difib      Mobility Bed Mobility Overal bed mobility: Needs Assistance Bed Mobility: Supine to Sit;Rolling Rolling: Supervision Sidelying to sit: Min assist;+2 for safety/equipment Supine to sit: Min assist;+2 for safety/equipment     General bed mobility comments: increased effort, HOB elevated, pt utilized rail    Transfers Overall transfer level: Needs assistance Equipment used: Rolling walker (2 wheeled) Transfers: Sit to/from Stand Sit to Stand: [employer] physical assistance;From elevated surface Stand pivot transfers: +2 safety/equipment;Min assist       General transfer comment: minA for stability  and safety with standing from EOB as well as for transfer as pt with posterior lean.  Pt needs constant support for stability.    Balance Overall balance assessment: Needs assistance Sitting-balance support: Feet supported;No upper extremity supported Sitting balance-Leahy Scale: Fair Sitting balance - Comments: pt min guard for sitting EOB Postural control: Posterior lean Standing balance support: During functional activity;Bilateral upper extremity supported Standing balance-Leahy Scale: Poor Standing balance comment: relies on UE support from RW;Needs min assist for stability due to posterior lean                           ADL either performed or assessed with clinical judgement   ADL Overall ADL's : Needs assistance/impaired   Eating/Feeding Details (indicate cue type and reason): Cortrak placed             Upper Body Dressing : Moderate assistance;Sitting   Lower Body Dressing: Maximal assistance;Sitting/lateral leans               Functional mobility during ADLs: Minimal assistance;Rolling walker;+2 for safety/equipment       Vision Patient Visual Report: No change from baseline       Perception     Praxis      Pertinent Vitals/Pain Pain Assessment: No/denies pain Pain Intervention(s): Monitored during session     Hand Dominance Right   Extremity/Trunk Assessment Upper Extremity Assessment Upper Extremity Assessment: Generalized weakness   Lower Extremity Assessment Lower Extremity Assessment: Generalized weakness   Cervical / Trunk Assessment Cervical / Trunk Assessment: Kyphotic   Communication Communication Communication: No difficulties   Cognition Arousal/Alertness: Awake/alert Behavior During Therapy: Flat affect Overall Cognitive Status: Within Functional Limits for tasks assessed Area of Impairment: Safety/judgement  Safety/Judgement: Decreased awareness of safety;Decreased awareness of  deficits         General Comments  HR 64 bpm    Exercises   Shoulder Instructions      Home Living Family/patient expects to be discharged to:: Private residence Living Arrangements: Spouse/significant other Available Help at Discharge: Family;Available 24 hours/day Type of Home: House Home Access: Stairs to enter [employer] of Steps: 1 Entrance Stairs-Rails: None Home Layout: Two level;Bed/bath upstairs Alternate Level Stairs-Number of Steps: flight Alternate Level Stairs-Rails: Left Bathroom Shower/Tub: Occupational psychologist: Standard Bathroom Accessibility: Yes How Accessible: Accessible via walker Home Equipment: None;Shower seat      Lives With: Spouse    Prior Functioning/Environment Level of Independence: Independent                 OT Problem List: Decreased strength;Decreased activity tolerance;Impaired balance (sitting and/or standing);Pain      OT Treatment/Interventions: Self-care/ADL training;Therapeutic exercise;Balance training;Patient/family education;Therapeutic activities    OT Goals(Current goals can be found in the care plan section) Acute Rehab OT Goals Patient Stated Goal: I'm ready to go home.. OT Goal Formulation: With patient Time For Goal Achievement: 06/16/20 Potential to Achieve Goals: Fair ADL Goals Pt Will Perform Grooming: with supervision;standing Pt Will Perform Lower Body Bathing: with supervision;sit to/from stand Pt Will Perform Lower Body Dressing: with supervision;sit to/from stand Pt Will Transfer to Toilet: with supervision;ambulating;regular height toilet Pt Will Perform Toileting - Clothing Manipulation and hygiene: with supervision;sit to/from stand  OT Frequency: Min 2X/week   Barriers to D/C:    medical status       Co-evaluation PT/OT/SLP Co-Evaluation/Treatment: Yes Reason for Co-Treatment: Complexity of the patient's impairments (multi-system involvement) PT goals addressed  during session: Mobility/safety with mobility OT goals addressed during session: Strengthening/ROM      AM-PAC OT "6 Clicks" Daily Activity     Outcome Measure Help from another person eating meals?: Total Help from another person taking care of personal grooming?: A Little Help from another person toileting, which includes using toliet, bedpan, or urinal?: A Lot Help from another person bathing (including washing, rinsing, drying)?: A Lot Help from another person to put on and taking off regular upper body clothing?: A Little Help from another person to put on and taking off regular lower body clothing?: A Lot 6 Click Score: 13   End of Session Equipment Utilized During Treatment: Rolling walker  Activity Tolerance: Patient tolerated treatment well Patient left: in chair;with call bell/phone within reach;with chair alarm set  OT Visit Diagnosis: Other abnormalities of gait and mobility (R26.89);Muscle weakness (generalized) (M62.81)                Time: [id number] OT Time Calculation (min): 27 min Charges:  OT General Charges $OT Visit: 1 Visit OT Evaluation $OT Eval Moderate Complexity: 1 Mod  06/02/2020  Rich, OTR/L  Acute Rehabilitation Services  Office:  [phone]   Metta Clines 06/02/2020, 11:13 AM

## 2020-06-02 NOTE — Evaluation (Signed)
Physical Therapy Evaluation Patient Details Name: Cody Arellano MRN: [id number] DOB: 04-Dec-1945 Today's Date: 06/02/2020   History of Present Illness  Pt is a 74 y.o. male with medical history significant for lupus, interstitial lung disease, coronary artery disease, chronic systolic CHF, paroxysmal atrial fibrillation on Eliquis, and recent PE who presented to the emergency department due to shortness of breath that has been ongoing for about 2 weeks associated with nausea and intermittent nonbilious, nonbloody vomiting ~2-3 times per week. Pt had cardiogenic shock and impella placed 11/30; heart cath on 12/1. CXR 11/30: Marked worsening of airspace disease throughout the right chest most worrisome for pneumonia. CXR 12/1: Significant interval clearing of airspace opacity on the right as well as milder clearing on the left. No new opacity evident. CXR 12/2: Stable bibasilar subsegmental atelectasis. ETT 11/30-12/2.  Impella removed on 05/16/20. Transferred to Rehab on 12/20.  He had been doing well on CIR and was set up for discharge 06/01/20. Pt had been working with OT and had completed his bath and was feeling ok. Dr Orvan Seen saw him and recommended suture removal. Sutures removed from R upper chest and R lateral chest and pt tolerated without difficulty. After MD left OT walked back in the room and pt was unresponsive. Code initiated. Zoll rhythm showed asystole. Had CPR and given epi with ROSC. Intubated and transferred to [facility] on 12/28.  Extubated 12/29.  Clinical Impression  Pt admitted with above diagnosis. Pt was able to transfer to chair and ambulate a few steps forward and back with RW with min assist with pt with posterior lean needing steadying assist. Will continue to follow acutely.  Pt currently with functional limitations due to the deficits listed below (see PT Problem List). Pt will benefit from skilled PT to increase their independence and safety with mobility to allow discharge to the venue  listed below.      Follow Up Recommendations Supervision/Assistance - 24 hour;Home health PT    Equipment Recommendations  Rolling walker with 5" wheels;3in1 (PT)    Recommendations for Other Services       Precautions / Restrictions Precautions Precautions: Fall Precaution Comments: skin integrity- pressure sore on buttocks, restricted RUE Restrictions Weight Bearing Restrictions: No      Mobility  Bed Mobility   Bed Mobility: Supine to Sit Rolling: Supervision Sidelying to sit: Min assist Supine to sit: Min assist     General bed mobility comments: increased effort, HOB elevated, pt utilized rail    Transfers Overall transfer level: Needs assistance Equipment used: Rolling walker (2 wheeled) Transfers: Sit to/from Stand Sit to Stand: [employer] physical assistance;From elevated surface Stand pivot transfers: +2 safety/equipment;Min assist       General transfer comment: minA for stability and safety with standing from EOB as well as for transfer as pt with posterior lean.  Pt needs constant support for stability.  Ambulation/Gait Ambulation/Gait assistance: Min assist;+2 safety/equipment Gait Distance (Feet): 32 Feet (walked forward and backward 4 x for ~8 feet each time.) Assistive device: Rolling walker (2 wheeled) Gait Pattern/deviations: Decreased stride length;Narrow base of support;Step-through pattern Gait velocity: decreased compared to his normal Gait velocity interpretation: <1.8 ft/sec, indicate of risk for recurrent falls General Gait Details: Slow, mildly unsteady gait. VSS on RA. Continues with posterior lean at times with ambulation as well.  Stairs            Wheelchair Mobility    Modified Rankin (Stroke Patients Only)       Balance Overall  balance assessment: Needs assistance Sitting-balance support: Feet supported;No upper extremity supported Sitting balance-Leahy Scale: Good Sitting balance - Comments: pt min guard for  sitting EOB Postural control: Posterior lean Standing balance support: During functional activity;Bilateral upper extremity supported Standing balance-Leahy Scale: Poor Standing balance comment: relies on UE support from RW;Needs min assist for stability due to posterior lean                             Pertinent Vitals/Pain Pain Assessment: No/denies pain    Home Living Family/patient expects to be discharged to:: Private residence Living Arrangements: Spouse/significant other Available Help at Discharge: Family;Available 24 hours/day Type of Home: House Home Access: Stairs to enter Entrance Stairs-Rails: None Entrance Stairs-Number of Steps: 1 Home Layout: Two level;Bed/bath upstairs Home Equipment: None;Shower seat      Prior Function Level of Independence: Independent               Hand Dominance   Dominant Hand: Right    Extremity/Trunk Assessment   Upper Extremity Assessment Upper Extremity Assessment: Defer to OT evaluation    Lower Extremity Assessment Lower Extremity Assessment: Generalized weakness    Cervical / Trunk Assessment Cervical / Trunk Assessment: Kyphotic  Communication   Communication: No difficulties  Cognition Arousal/Alertness: Awake/alert Behavior During Therapy: Flat affect Overall Cognitive Status: Within Functional Limits for tasks assessed Area of Impairment: Safety/judgement                         Safety/Judgement: Decreased awareness of safety;Decreased awareness of deficits            General Comments General comments (skin integrity, edema, etc.): HR 64 bpm    Exercises General Exercises - Lower Extremity Ankle Circles/Pumps: AROM;Both;10 reps Long Arc Quad: AROM;Strengthening;Both;Seated;10 reps Hip Flexion/Marching: AROM;Both;10 reps;Seated   Assessment/Plan    PT Assessment Patient needs continued PT services  PT Problem List Decreased activity tolerance;Decreased balance;Decreased  mobility;Decreased safety awareness;Decreased knowledge of use of DME;Decreased knowledge of precautions;Cardiopulmonary status limiting activity       PT Treatment Interventions DME instruction;Gait training;Functional mobility training;Therapeutic activities;Therapeutic exercise;Balance training;Patient/family education;Stair training    PT Goals (Current goals can be found in the Care Plan section)  Acute Rehab PT Goals Patient Stated Goal: be able to take care of myself and spend time with my family PT Goal Formulation: With patient Time For Goal Achievement: 06/15/20 Potential to Achieve Goals: Good    Frequency Min 3X/week   Barriers to discharge        Co-evaluation PT/OT/SLP Co-Evaluation/Treatment: Yes Reason for Co-Treatment: Complexity of the patient's impairments (multi-system involvement);For patient/therapist safety PT goals addressed during session: Mobility/safety with mobility         AM-PAC PT "6 Clicks" Mobility  Outcome Measure Help needed turning from your back to your side while in a flat bed without using bedrails?: None Help needed moving from lying on your back to sitting on the side of a flat bed without using bedrails?: A Little Help needed moving to and from a bed to a chair (including a wheelchair)?: A Little Help needed standing up from a chair using your arms (e.g., wheelchair or bedside chair)?: A Little Help needed to walk in hospital room?: A Little Help needed climbing 3-5 steps with a railing? : A Lot 6 Click Score: 18    End of Session   Activity Tolerance: Patient tolerated treatment well Patient left: with call  bell/phone within reach;in chair;with chair alarm set Nurse Communication: Mobility status PT Visit Diagnosis: Muscle weakness (generalized) (M62.81);Difficulty in walking, not elsewhere classified (R26.2)    Time: 1751-0258 PT Time Calculation (min) (ACUTE ONLY): 36 min   Charges:   PT Evaluation $PT Eval Moderate  Complexity: 1 Mod          Cody Arellano,PT Acute Rehabilitation Services Pager:  [phone]  Office:  [phone]    Berline Lopes 06/02/2020, 10:34 AM

## 2020-06-02 NOTE — Progress Notes (Signed)
   Patient discussed with Dr. Shirlee Latch -- pt extubated 12/29 and remains on nasal cannula.    PCCM will sign off. Please re-engage if we can be of further assistance or if the patient's clinical status changes    Tessie Fass MSN, AGACNP-BC [facility] Pulmonary/Critical Care Medicine 06/02/2020, 8:49 AM

## 2020-06-02 NOTE — Progress Notes (Addendum)
Patient ID: Cody Arellano, male   DOB: 1946/02/24, 74 y.o.   MRN: [id number]     Advanced Heart Failure Rounding Note  PCP-Cardiologist: Mertie Moores, MD   Subjective:    16/01: Asystolic arrest in CIR, ROSC with 1 dose lidocaine and transferred to [place].  On 2H, had episode of torsades/PMVT preceded by R on T phenomenon in setting of prolonged QT interval.  Terminated by 1 shock.   Extubated 12/29  Remains on milrinone 0.125 mcg. CO-OX < 40%.   Complaining of fatigue.   Objective:   Weight Range: 58.5 kg Body mass index is 17.49 kg/m.   Vital Signs:   Temp:  [97.9 F (36.6 C)-100 F (37.8 C)] 97.9 F (36.6 C) (12/30 0000) Pulse Rate:  [46-68] 68 (12/30 0600) Resp:  [10-24] 14 (12/30 0600) BP: (84-163)/(26-65) 144/52 (12/30 0600) SpO2:  [91 %-100 %] 94 % (12/30 0600) FiO2 (%):  [40 %] 40 % (12/29 0807) Weight:  [58.5 kg] 58.5 kg (12/30 0409) Last BM Date: 05/30/20  Weight change: Filed Weights   05/31/20 1630 06/01/20 0418 06/02/20 0409  Weight: 58.4 kg 58.6 kg 58.5 kg    Intake/Output:   Intake/Output Summary (Last 24 hours) at 06/02/2020 0728 Last data filed at 06/02/2020 0600 Gross per 24 hour  Intake 668.14 ml  Output 760 ml  Net -91.86 ml      Physical Exam   CVP 14 General:  Appears weak.  No resp difficulty HEENT: normal Neck: supple. JVP elevated.  Carotids 2+ bilat; no bruits. No lymphadenopathy or thryomegaly appreciated. Cor: PMI nondisplaced. Regular rate & rhythm. No rubs, gallops or murmurs. Zoll Pads Lungs: clear Abdomen: soft, nontender, nondistended. No hepatosplenomegaly. No bruits or masses. Good bowel sounds. Extremities: no cyanosis, clubbing, rash, edema. RUE PICC Neuro: alert & orientedx3, cranial nerves grossly intact. moves all 4 extremities w/o difficulty. Affect flat    Telemetry  Junctional Thythm 50s.   EKG    NSR with left axis deviation, old inferior MI, QTc 506 (personally reviewed)  Labs    CBC Recent Labs     05/31/20 1533 05/31/20 2005 05/31/20 2023 06/01/20 0341 06/01/20 0350  WBC 9.0 12.2*  --  8.6  --   NEUTROABS 4.4  --   --   --   --   HGB 10.3* 10.2*   < > 9.7* 11.6*  HCT 35.6* 34.3*   < > 33.7* 34.0*  MCV 92.2 91.7  --  92.3  --   PLT 332 318  --  301  --    < > = values in this interval not displayed.   Basic Metabolic Panel Recent Labs    06/01/20 0341 06/01/20 0350 06/01/20 1710 06/02/20 0343  NA 139 139  --  137  K 4.9 4.7  --  4.6  CL 100  --   --  99  CO2 28  --   --  26  GLUCOSE 146*  --   --  130*  BUN 37*  --   --  42*  CREATININE 1.43*  --   --  1.19  CALCIUM 9.0  --   --  8.6*  MG 2.2  --  2.4 2.6*  PHOS 4.9*  --  4.4 4.4   Liver Function Tests Recent Labs    05/31/20 1533 06/01/20 0341  AST 115* 99*  ALT 86* 96*  ALKPHOS 72 61  BILITOT 1.1 0.9  PROT 7.0 6.6  ALBUMIN 2.9* 2.7*   No  results for input(s): LIPASE, AMYLASE in the last 72 hours. Cardiac Enzymes No results for input(s): CKTOTAL, CKMB, CKMBINDEX, TROPONINI in the last 72 hours.  BNP: BNP (last 3 results) Recent Labs    04/26/20 1319 04/30/20 0248 05/31/20 1533  BNP 1,824.4* 1,685.5* 1,995.6*    ProBNP (last 3 results) No results for input(s): PROBNP in the last 8760 hours.   D-Dimer No results for input(s): DDIMER in the last 72 hours. Hemoglobin A1C No results for input(s): HGBA1C in the last 72 hours. Fasting Lipid Panel Recent Labs    06/02/20 0343  TRIG 41   Thyroid Function Tests Recent Labs    05/31/20 1533  TSH 7.218*    Other results:   Imaging    Overnight EEG with video  Result Date: 06/01/2020 [name], MD     06/01/2020  1:40 PM Patient Name: Cody Arellano MRN: [id number] Epilepsy Attending: [name] Referring Physician/Provider: Dr Kipp Brood Duration: 05/31/2020 1712 to 06/01/2020 1048  Patient history: 74yo m s/p cardiac arrest. EEG to evaluate for seizure  Level of alertness:  Awake/lethargic  AEDs during EEG study:  propofol  Technical aspects: This EEG study was done with scalp electrodes positioned according to the 10-20 International system of electrode placement. Electrical activity was acquired at a sampling rate of _0  and reviewed with a high frequency filter of _1  and a low frequency filter of _2 . EEG data were recorded continuously and digitally stored.  Description: No posterior dominant rhythm was seen. EEG showed continuous generalized 6-7 Hz theta as well as intermittent 2-_3  delta slowing. Hyperventilation and photic stimulation were not performed.    ABNORMALITY -Continuous slow, generalized  IMPRESSION: This study is suggestive of moderate diffuse encephalopathy, nonspecific etiology. No seizures or epileptiform discharges were seen throughout the recording.  Priyanka Barbra Sarks     Medications:     Scheduled Medications: . apixaban  5 mg Per Tube BID  . chlorhexidine gluconate (MEDLINE KIT)  15 mL Mouth Rinse BID  . Chlorhexidine Gluconate Cloth  6 each Topical Daily  . docusate  100 mg Per Tube BID  . feeding supplement (PROSource TF)  45 mL Per Tube TID  . furosemide  80 mg Intravenous BID  . mexiletine  150 mg Per Tube Q12H  . pantoprazole sodium  40 mg Per Tube Daily  . polyethylene glycol  17 g Oral Daily  . rosuvastatin  20 mg Per Tube Daily  . sodium chloride flush  3 mL Intravenous Q12H  . sorbitol  30 mL Oral Once    Infusions: . sodium chloride    . feeding supplement (VITAL 1.5 CAL) 60 mL/hr at 06/02/20 0600  . milrinone 0.125 mcg/kg/min (06/02/20 0600)  . norepinephrine (LEVOPHED) Adult infusion      PRN Medications: sodium chloride, acetaminophen, ondansetron (ZOFRAN) IV, sodium chloride flush, sodium chloride flush   Assessment/Plan   1. Cardiac arrest: Had VT episode 12/7, required DCCV.  95/62 had asystolic arrest in CIR required 1 dose epinephrine and brief CPR.  Torsades/PMVT later on 12/27 in setting of markedly prolonged QT interval and R on T  phenomenon, lidocaine started. K and Mg normal.  ECG 12/29 QTc prolonged 506 msec. Echo unchanged, head CT negative, HS-TnI mildly elevated to peak 190 in setting of arrests.  - 12/29 Off lidocaine gtt and switched to mexiletine 150 mg twice a day.  No VT overnight. EKG today QTc 339 msec.  2. Chronic systolic CHF/cardiogenic shock: Ischemic cardiomyopathy. Echo this  admission looks worse than 10/21 with EF 20-25% with septal akinesis, mid to apical inferior akinesis, apical anterior and apex akinesis, moderately dilated and dysfunctional RV, moderate TR, severe MR, bicuspid aortic valve with no stenosis, mild aortic insufficiency. Progressive/end-stage CHF worsened by onset of atrial fibrillation. Cardiogenic shock 11/30 with co-ox down to 30%, Impella 5.5 placed to allow time to stabilize and consider further steps. Impella extracted 12/13. 0.125 milrinone added post explant for marginal co-ox. Remains on  milrinone 0.125. He has end-stage biventricular HF. He is not a transplant candidate. Barriers to LVAD include nutritional status/deconditioning and RV. I think RV would be acceptable. We discussed in [place], not LVAD candidate at this point with malnutrition/deconditioning. He went to CIR for aggressive PT, had improved nutritional and functional status and was going to go home but had arrest. Repeat echo post-arrest with EF 25%, mildly decreased RV function, moderate MR.   - CO-OX< 40%. Increase milrinone 0.25 mcg.  - CVP up 13--14. Start lasix 80 mg IV twice a day. Renal function stable.  - Add 12.5 mg spiro daily.  3. CAD: Prior history of MI with LAD and ramus PCI. AdmittedMay 2021with anterior STEMI. LHC with occluded ostial LAD (in-stent), 90% ostial ramus, 60-70% proximal LCx, nondominant RCA. PTCA to LAD and ramus to restore flow, then CABG with LIMA-LAD and SVG-ramus. No chest pain, doubt ACS. Fall in EF from 10/21 to 11/21, but he did not present with ACS. [place] 12/1 showed patient  LIMA-LAD, patent native LAD, patent dominant LCx. The SVG-ramus is occluded with severe diffuse in-stent restenosis in proximal ramus. Ramus not intervened upon as this would be a complex intervention and unlikely to markedly improve EF. HS-TnI mildly elevated with cardiac arrest, suspect due to arrest and not ACS.   - No chest pain.  - Continue statin.  4.  AKI: Mild post-arrest, creatinine now trending down at 1.4.   5. Right pleural effusion: S/p thoracentesis, transudative (CHF).  6. Atrial fibrillation/flutter with RVR: DCCV on 12/3, went back to NSR 12/7.In NSR currently.  Had hematuria overnight and heparin gtt stopped.  - Off amiodarone with long QTc.  - Off lidocaine drip 12/29 and switched to mexiletine. 7. Mitral regurgitation: TEE 12/3 with moderate central MR. Doubt MitraClip will help him much with moderate MR and he is likely too advanced.  8. H/o PE: In 10/21. Keep anticoagulated, on apixaban. No bleeding  9. Bicuspid aortic valve: Mild AI, no AS.  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 Hold Imuran and hydroxychloroquine until he can swallow.  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  12. Acute hypoxemic respiratory failure: Extubated 12/29 Weaned to nasal cannula.  -CO-OX < 40%. Increase milrinone 0.25 mcg . Check CO-OX 1200  - CVP up. Start 80 mg IV lasix twice a day. Add spiro 12.5 mg daily.  -Consult PT/OT.    Length of Stay: 2  Darrick Grinder, NP  06/02/2020, 7:28 AM  Advanced Heart Failure Team Pager [phone] (M-F; 7a - 4p)  Please contact [facility] for night-coverage after hours (4p -7a ) and weekends on [email or web address]  Patient seen with NP, agree with the above note.   Co-ox low this morning, 41%.  CVP 14.  He is in a junctional rhythm rate 50s.  Creatinine improved 1.19.    Patient is alert and oriented this morning.   Cortrak placed for tube feeds.   General: NAD Neck: JVP 12 cm, no thyromegaly or thyroid nodule.  Lungs:  Clear to auscultation bilaterally  with normal respiratory effort. CV: Lateral PMI.  Heart regular S1/S2, no S3/S4, 2/6 HSM apex.  No peripheral edema.   Abdomen: Soft, nontender, no hepatosplenomegaly, no distention.  Skin: Intact without lesions or rashes.  Neurologic: Alert and oriented x 3.  Psych: Normal affect. Extremities: No clubbing or cyanosis.  HEENT: Normal.   Co-ox lower today, will increase milrinone to 0.25.  Repeat co-ox later today.   Volume overloaded, start Lasix 80 mg IV bid and follow I/Os and creatinine closely.  Add back spironolactone 12.5 daily.   He is in a junctional rhythm this morning.  Has had periodically alternating with NSR since arrest.  Would avoid digoxin use.    No further VT, have stopped lidocaine and now on mexiletine.  No amiodarone because of long QTc and torsades.   CRITICAL CARE Performed by: Loralie Champagne  Total critical care time: 35 minutes  Critical care time was exclusive of separately billable procedures and treating other patients.  Critical care was necessary to treat or prevent imminent or life-threatening deterioration.  Critical care was time spent personally by me on the following activities: development of treatment plan with patient and/or surrogate as well as nursing, discussions with consultants, evaluation of patient's response to treatment, examination of patient, obtaining history from patient or surrogate, ordering and performing treatments and interventions, ordering and review of laboratory studies, ordering and review of radiographic studies, pulse oximetry and re-evaluation of patient's condition.  Loralie Champagne 06/02/2020 8:10 AM

## 2020-06-02 NOTE — Plan of Care (Signed)
  Problem: Education: Goal: Knowledge of General Education information will improve Description: Including pain rating scale, medication(s)/side effects and non-pharmacologic comfort measures Outcome: Progressing   Problem: Clinical Measurements: Goal: Diagnostic test results will improve Outcome: Progressing   

## 2020-06-03 LAB — COOXEMETRY PANEL
Carboxyhemoglobin: 1.6 % — ABNORMAL HIGH (ref 0.5–1.5)
Methemoglobin: 0.8 % (ref 0.0–1.5)
O2 Saturation: 56.6 %
Total hemoglobin: 9.2 g/dL — ABNORMAL LOW (ref 12.0–16.0)

## 2020-06-03 LAB — PHOSPHORUS: Phosphorus: 3.2 mg/dL (ref 2.5–4.6)

## 2020-06-03 LAB — MAGNESIUM: Magnesium: 2.3 mg/dL (ref 1.7–2.4)

## 2020-06-03 LAB — BASIC METABOLIC PANEL
Anion gap: 12 (ref 5–15)
BUN: 45 mg/dL — ABNORMAL HIGH (ref 8–23)
CO2: 29 mmol/L (ref 22–32)
Calcium: 8.5 mg/dL — ABNORMAL LOW (ref 8.9–10.3)
Chloride: 97 mmol/L — ABNORMAL LOW (ref 98–111)
Creatinine, Ser: 0.99 mg/dL (ref 0.61–1.24)
GFR, Estimated: >60 mL/min (ref >60)
Glucose, Bld: 96 mg/dL (ref 70–99)
Potassium: 4.5 mmol/L (ref 3.5–5.1)
Sodium: 138 mmol/L (ref 135–145)

## 2020-06-03 LAB — CBC
HCT: 30.2 % — ABNORMAL LOW (ref 39.0–52.0)
Hemoglobin: 9 g/dL — ABNORMAL LOW (ref 13.0–17.0)
MCH: 26.7 pg (ref 26.0–34.0)
MCHC: 29.8 g/dL — ABNORMAL LOW (ref 30.0–36.0)
MCV: 89.6 fL (ref 80.0–100.0)
Platelets: 255 10*3/uL (ref 150–400)
RBC: 3.37 MIL/uL — ABNORMAL LOW (ref 4.22–5.81)
RDW: 21.2 % — ABNORMAL HIGH (ref 11.5–15.5)
WBC: 8.1 10*3/uL (ref 4.0–10.5)
nRBC: 0.4 % — ABNORMAL HIGH (ref 0.0–0.2)

## 2020-06-03 LAB — GLUCOSE, CAPILLARY
Glucose-Capillary: 114 mg/dL — ABNORMAL HIGH (ref 70–99)
Glucose-Capillary: 115 mg/dL — ABNORMAL HIGH (ref 70–99)
Glucose-Capillary: 117 mg/dL — ABNORMAL HIGH (ref 70–99)
Glucose-Capillary: 119 mg/dL — ABNORMAL HIGH (ref 70–99)
Glucose-Capillary: 129 mg/dL — ABNORMAL HIGH (ref 70–99)
Glucose-Capillary: 88 mg/dL (ref 70–99)

## 2020-06-03 LAB — TRIGLYCERIDES: Triglycerides: 26 mg/dL (ref <150)

## 2020-06-03 MED ORDER — TORSEMIDE 20 MG PO TABS
20.0000 mg | ORAL_TABLET | Freq: Every day | ORAL | Status: DC
  Administered 2020-06-03 – 2020-06-10 (×8): 20 mg via ORAL
  Filled 2020-06-03 (×8): qty 1

## 2020-06-03 MED ORDER — ENSURE ENLIVE PO LIQD
237.0000 mL | Freq: Two times a day (BID) | ORAL | Status: DC
  Administered 2020-06-03 – 2020-06-07 (×8): 237 mL via ORAL

## 2020-06-03 MED ORDER — SPIRONOLACTONE 25 MG PO TABS
25.0000 mg | ORAL_TABLET | Freq: Every day | ORAL | Status: DC
  Administered 2020-06-03 – 2020-06-04 (×2): 25 mg
  Filled 2020-06-03 (×2): qty 1

## 2020-06-03 NOTE — Progress Notes (Addendum)
Patient ID: Cody Arellano, male   DOB: [birth year], 74 y.o.   MRN: [id number]     Advanced Heart Failure Rounding Note  PCP-Cardiologist: Mertie Moores, MD   Subjective:    62/69: Asystolic arrest in CIR, ROSC with 1 dose lidocaine and transferred to [place].  On 2H, had episode of torsades/PMVT preceded by R on T phenomenon in setting of prolonged QT interval.  Terminated by 1 shock.  Extubated 12/29  Yesterday milrinone increased to 0.25 mcg and started on IV lasix. CO-OX 57%   Denies SOB. Complaining of constipation.    Objective:   Weight Range: 57.4 kg Body mass index is 17.17 kg/m.   Vital Signs:   Temp:  [97.6 F (36.4 C)-99 F (37.2 C)] 97.8 F (36.6 C) (12/31 0645) Pulse Rate:  [53-124] 62 (12/31 0700) Resp:  [15-29] 21 (12/31 0700) BP: (96-177)/(21-85) 110/70 (12/31 0700) SpO2:  [86 %-100 %] 100 % (12/31 0700) Weight:  [57.4 kg] 57.4 kg (12/31 0645) Last BM Date: 06/02/20  Weight change: Filed Weights   06/01/20 0418 06/02/20 0409 06/03/20 0645  Weight: 58.6 kg 58.5 kg 57.4 kg    Intake/Output:   Intake/Output Summary (Last 24 hours) at 06/03/2020 0729 Last data filed at 06/03/2020 0700 Gross per 24 hour  Intake 1770.56 ml  Output 2001 ml  Net -230.44 ml      Physical Exam  CVP 4 General:  Sitting in the chair. No resp difficulty HEENT: normal Neck: supple. no JVD. Carotids 2+ bilat; no bruits. No lymphadenopathy or thryomegaly appreciated. Cor: PMI nondisplaced. Regular rate & rhythm. No rubs, gallops or murmurs. Lungs: clear Abdomen: soft, nontender, nondistended. No hepatosplenomegaly. No bruits or masses. Good bowel sounds. Extremities: no cyanosis, clubbing, rash, edema Neuro: alert & orientedx3, cranial nerves grossly intact. moves all 4 extremities w/o difficulty. Affect pleasant   Telemetry  SR 60s. NO VT overnight.   EKG    NSR with left axis deviation, old inferior MI, QTc 506 (personally reviewed)  Labs    CBC Recent Labs     05/31/20 1533 05/31/20 2005 06/02/20 0818 06/03/20 0351  WBC 9.0   < > 7.2 8.1  NEUTROABS 4.4  --   --   --   HGB 10.3*   < > 9.1* 9.0*  HCT 35.6*   < > 30.1* 30.2*  MCV 92.2   < > 91.2 89.6  PLT 332   < > 268 255   < > = values in this interval not displayed.   Basic Metabolic Panel Recent Labs    06/02/20 1230 06/02/20 1700 06/03/20 0351  NA 139  --  138  K 4.2  --  4.5  CL 99  --  97*  CO2 29  --  29  GLUCOSE 104*  --  96  BUN 40*  --  45*  CREATININE 1.19  --  0.99  CALCIUM 8.5*  --  8.5*  MG  --  2.4 2.3  PHOS  --  3.3 3.2   Liver Function Tests Recent Labs    05/31/20 1533 06/01/20 0341  AST 115* 99*  ALT 86* 96*  ALKPHOS 72 61  BILITOT 1.1 0.9  PROT 7.0 6.6  ALBUMIN 2.9* 2.7*   No results for input(s): LIPASE, AMYLASE in the last 72 hours. Cardiac Enzymes No results for input(s): CKTOTAL, CKMB, CKMBINDEX, TROPONINI in the last 72 hours.  BNP: BNP (last 3 results) Recent Labs    04/26/20 1319 04/30/20 0248 05/31/20 1533  BNP 1,824.4* 1,685.5* 1,995.6*    ProBNP (last 3 results) No results for input(s): PROBNP in the last 8760 hours.   D-Dimer No results for input(s): DDIMER in the last 72 hours. Hemoglobin A1C No results for input(s): HGBA1C in the last 72 hours. Fasting Lipid Panel Recent Labs    06/03/20 0351  TRIG 26   Thyroid Function Tests Recent Labs    05/31/20 1533  TSH 7.218*    Other results:   Imaging    No results found.   Medications:     Scheduled Medications: . apixaban  5 mg Per Tube BID  . aspirin  81 mg Per Tube Daily  . chlorhexidine gluconate (MEDLINE KIT)  15 mL Mouth Rinse BID  . Chlorhexidine Gluconate Cloth  6 each Topical Daily  . docusate  100 mg Per Tube BID  . feeding supplement (PROSource TF)  45 mL Per Tube TID  . mexiletine  150 mg Per Tube Q12H  . pantoprazole sodium  40 mg Per Tube Daily  . polyethylene glycol  17 g Oral Daily  . rosuvastatin  20 mg Per Tube Daily  . sodium  chloride flush  3 mL Intravenous Q12H  . spironolactone  12.5 mg Per Tube Daily    Infusions: . sodium chloride 10 mL/hr at 06/03/20 0700  . feeding supplement (VITAL 1.5 CAL) 60 mL/hr at 06/03/20 0600  . milrinone 0.25 mcg/kg/min (06/03/20 0700)  . norepinephrine (LEVOPHED) Adult infusion      PRN Medications: sodium chloride, acetaminophen, ondansetron (ZOFRAN) IV, sodium chloride flush, sodium chloride flush   Assessment/Plan   1. Cardiac arrest: Had VT episode 12/7, required DCCV.  74/94 had asystolic arrest in CIR required 1 dose epinephrine and brief CPR.  Torsades/PMVT later on 12/27 in setting of markedly prolonged QT interval and R on T phenomenon, lidocaine started. K and Mg normal.  ECG 12/29 QTc prolonged 506 msec. Echo unchanged, head CT negative, HS-TnI mildly elevated to peak 190 in setting of arrests.  - 12/29 Off lidocaine gtt and switched to mexiletine 150 mg twice a day.  - No VT over night.  2. Chronic systolic CHF/cardiogenic shock: Ischemic cardiomyopathy. Echo this admission looks worse than 10/21 with EF 20-25% with septal akinesis, mid to apical inferior akinesis, apical anterior and apex akinesis, moderately dilated and dysfunctional RV, moderate TR, severe MR, bicuspid aortic valve with no stenosis, mild aortic insufficiency. Progressive/end-stage CHF worsened by onset of atrial fibrillation. Cardiogenic shock 11/30 with co-ox down to 30%, Impella 5.5 placed to allow time to stabilize and consider further steps. Impella extracted 12/13. 0.125 milrinone added post explant for marginal co-ox. Remains on  milrinone 0.125. He has end-stage biventricular HF. He is not a transplant candidate. Barriers to LVAD include nutritional status/deconditioning and RV. I think RV would be acceptable. We discussed in [place], not LVAD candidate at this point with malnutrition/deconditioning. He went to CIR for aggressive PT, had improved nutritional and functional status and was going to  go home but had arrest. Repeat echo post-arrest with EF 25%, mildly decreased RV function, moderate MR.   - Yesterday milrinone increased to CO-OX  57%.  - CVP down to 3-4. Volume status improved.  - Stop IV lasix. Start torsemide 20 mg daily.  - Increase spiro 25 mg daily.  -  Renal function stable.  3. CAD: Prior history of MI with LAD and ramus PCI. AdmittedMay 2021with anterior STEMI. LHC with occluded ostial LAD (in-stent), 90% ostial ramus, 60-70% proximal LCx, nondominant  RCA. PTCA to LAD and ramus to restore flow, then CABG with LIMA-LAD and SVG-ramus. No chest pain, doubt ACS. Fall in EF from 10/21 to 11/21, but he did not present with ACS. [place] 12/1 showed patient LIMA-LAD, patent native LAD, patent dominant LCx. The SVG-ramus is occluded with severe diffuse in-stent restenosis in proximal ramus. Ramus not intervened upon as this would be a complex intervention and unlikely to markedly improve EF. HS-TnI mildly elevated with cardiac arrest, suspect due to arrest and not ACS.   - No chest pain.  - Continue statin.  4.  AKI: Mild post-arrest, creatinine down 0.99. .   5. Right pleural effusion: S/p thoracentesis, transudative (CHF).  6. Atrial fibrillation/flutter with RVR: DCCV on 12/3, went back to NSR 12/7.In NSR currently.  Had hematuria overnight and heparin gtt stopped.  - Off amiodarone with long QTc.  - Off lidocaine drip 12/29 and switched to mexiletine. 7. Mitral regurgitation: TEE 12/3 with moderate central MR. Doubt MitraClip will help him much with moderate MR and he is likely too advanced.  8. H/o PE: In 10/21. Keep anticoagulated, on apixaban. No bleeding  9. Bicuspid aortic valve: Mild AI, no AS.  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 Hold Imuran and hydroxychloroquine until he can swallow.  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  12. Acute hypoxemic respiratory failure: Extubated 12/29 On nasal cannula 13. Deconditioning  PT/OT  consulted.   Continue to mobilize.  Length of Stay: 3  Amy Clegg, NP  06/03/2020, 7:29 AM  Advanced Heart Failure Team Pager [phone] (M-F; 7a - 4p)  Please contact [facility] for night-coverage after hours (4p -7a ) and weekends on [email or web address]  Patient seen with NP, agree with the above note.   Co-ox better today at 57%, CVP 4. NSR in 60s this morning, no arrhythmias.  Creatinine stable.  Patient is alert and oriented this morning.   Cortrak in place with tube feeds ongoing.   General: NAD, thin Neck: JVP 8 cm, no thyromegaly or thyroid nodule.  Lungs: Clear to auscultation bilaterally with normal respiratory effort. CV: Lateral PMI.  Heart regular S1/S2, no S3/S4, 2/6 HSM apex.  No peripheral edema.   Abdomen: Soft, nontender, no hepatosplenomegaly, no distention.  Skin: Intact without lesions or rashes.  Neurologic: Alert and oriented x 3.  Psych: Normal affect. Extremities: No clubbing or cyanosis.  HEENT: Normal.   Co-ox improved, continue milrinone 0.25.  CVP 4 today. Transition to torsemide 20 mg daily and can increase spironolactone to 25 mg daily. No digoxin with recent junctional bradycardia and asystole.  Can add SGLT2 inhibitor tomorrow if remains stable.   He is in a junctional rhythm this morning.  Has had periodically alternating with NSR since arrest.  Would avoid digoxin use.    No further VT, have stopped lidocaine and now on mexiletine.  No amiodarone because of long QTc and torsades. ECG today.   Can transfer to step down.   CRITICAL CARE Performed by: Loralie Champagne  Total critical care time: 35 minutes  Critical care time was exclusive of separately billable procedures and treating other patients.  Critical care was necessary to treat or prevent imminent or life-threatening deterioration.  Critical care was time spent personally by me on the following activities: development of treatment plan with patient and/or surrogate as well as  nursing, discussions with consultants, evaluation of patient's response to treatment, examination of patient, obtaining history from patient or surrogate, ordering and performing treatments and interventions, ordering and  review of laboratory studies, ordering and review of radiographic studies, pulse oximetry and re-evaluation of patient's condition.  Loralie Champagne 06/03/2020 9:27 AM

## 2020-06-03 NOTE — Progress Notes (Signed)
Report called to receiving Cody Arellano [phone]. Patient with no complaints at the current time. Spouse at bedside. Patient taken up by Cristi Loron on monitor in [facility].

## 2020-06-03 NOTE — Progress Notes (Signed)
Inpatient Rehab Admissions Coordinator Note:   Per OT recommendations, pt was screened for CIR candidacy by Estill Dooms, PT, DPT.  At this time we are recommending CIR consult.  If pt would like to be considered, please place an IP Rehab MD consult order.  Please contact me with questions.   Estill Dooms, PT, DPT [phone] 06/03/20 3:31 PM

## 2020-06-04 DIAGNOSIS — I5022 Chronic systolic (congestive) heart failure: Secondary | ICD-10-CM

## 2020-06-04 LAB — CBC
HCT: 30.4 % — ABNORMAL LOW (ref 39.0–52.0)
Hemoglobin: 9.1 g/dL — ABNORMAL LOW (ref 13.0–17.0)
MCH: 26.5 pg (ref 26.0–34.0)
MCHC: 29.9 g/dL — ABNORMAL LOW (ref 30.0–36.0)
MCV: 88.6 fL (ref 80.0–100.0)
Platelets: 225 10*3/uL (ref 150–400)
RBC: 3.43 MIL/uL — ABNORMAL LOW (ref 4.22–5.81)
RDW: 21.6 % — ABNORMAL HIGH (ref 11.5–15.5)
WBC: 9.2 10*3/uL (ref 4.0–10.5)
nRBC: 0.3 % — ABNORMAL HIGH (ref 0.0–0.2)

## 2020-06-04 LAB — BASIC METABOLIC PANEL
Anion gap: 12 (ref 5–15)
BUN: 40 mg/dL — ABNORMAL HIGH (ref 8–23)
CO2: 27 mmol/L (ref 22–32)
Calcium: 8.6 mg/dL — ABNORMAL LOW (ref 8.9–10.3)
Chloride: 99 mmol/L (ref 98–111)
Creatinine, Ser: 1.01 mg/dL (ref 0.61–1.24)
GFR, Estimated: >60 mL/min (ref >60)
Glucose, Bld: 94 mg/dL (ref 70–99)
Potassium: 4.5 mmol/L (ref 3.5–5.1)
Sodium: 138 mmol/L (ref 135–145)

## 2020-06-04 LAB — GLUCOSE, CAPILLARY
Glucose-Capillary: 103 mg/dL — ABNORMAL HIGH (ref 70–99)
Glucose-Capillary: 122 mg/dL — ABNORMAL HIGH (ref 70–99)
Glucose-Capillary: 127 mg/dL — ABNORMAL HIGH (ref 70–99)
Glucose-Capillary: 128 mg/dL — ABNORMAL HIGH (ref 70–99)
Glucose-Capillary: 155 mg/dL — ABNORMAL HIGH (ref 70–99)
Glucose-Capillary: 91 mg/dL (ref 70–99)

## 2020-06-04 LAB — TRIGLYCERIDES: Triglycerides: 39 mg/dL (ref <150)

## 2020-06-04 MED ORDER — SPIRONOLACTONE 25 MG PO TABS
25.0000 mg | ORAL_TABLET | Freq: Every day | ORAL | Status: DC
  Administered 2020-06-05 – 2020-06-10 (×6): 25 mg via ORAL
  Filled 2020-06-04 (×6): qty 1

## 2020-06-04 MED ORDER — DOCUSATE SODIUM 50 MG/5ML PO LIQD
100.0000 mg | Freq: Two times a day (BID) | ORAL | Status: DC
  Administered 2020-06-05 – 2020-06-07 (×4): 100 mg via ORAL
  Filled 2020-06-04 (×8): qty 10

## 2020-06-04 MED ORDER — APIXABAN 5 MG PO TABS
5.0000 mg | ORAL_TABLET | Freq: Two times a day (BID) | ORAL | Status: DC
  Administered 2020-06-04 – 2020-06-10 (×12): 5 mg via ORAL
  Filled 2020-06-04 (×12): qty 1

## 2020-06-04 MED ORDER — ASPIRIN 81 MG PO CHEW
81.0000 mg | CHEWABLE_TABLET | Freq: Every day | ORAL | Status: DC
  Administered 2020-06-05 – 2020-06-10 (×6): 81 mg via ORAL
  Filled 2020-06-04 (×6): qty 1

## 2020-06-04 MED ORDER — MEXILETINE HCL 150 MG PO CAPS
150.0000 mg | ORAL_CAPSULE | Freq: Two times a day (BID) | ORAL | Status: DC
  Administered 2020-06-04 – 2020-06-10 (×12): 150 mg via ORAL
  Filled 2020-06-04 (×13): qty 1

## 2020-06-04 MED ORDER — PANTOPRAZOLE SODIUM 40 MG PO PACK
40.0000 mg | PACK | Freq: Every day | ORAL | Status: DC
  Administered 2020-06-05 – 2020-06-09 (×5): 40 mg via ORAL
  Filled 2020-06-04 (×6): qty 20

## 2020-06-04 MED ORDER — ROSUVASTATIN CALCIUM 20 MG PO TABS
20.0000 mg | ORAL_TABLET | Freq: Every day | ORAL | Status: DC
Start: 2020-06-05 — End: 2020-06-10
  Administered 2020-06-05 – 2020-06-10 (×6): 20 mg via ORAL
  Filled 2020-06-04 (×6): qty 1

## 2020-06-04 MED ORDER — DAPAGLIFLOZIN PROPANEDIOL 10 MG PO TABS
10.0000 mg | ORAL_TABLET | Freq: Every day | ORAL | Status: DC
  Administered 2020-06-04 – 2020-06-10 (×7): 10 mg via ORAL
  Filled 2020-06-04 (×7): qty 1

## 2020-06-04 MED ORDER — AZATHIOPRINE 50 MG PO TABS
100.0000 mg | ORAL_TABLET | Freq: Two times a day (BID) | ORAL | Status: DC
  Administered 2020-06-04 – 2020-06-10 (×12): 100 mg via ORAL
  Filled 2020-06-04 (×14): qty 2

## 2020-06-04 MED ORDER — HYDROXYCHLOROQUINE SULFATE 200 MG PO TABS
400.0000 mg | ORAL_TABLET | Freq: Every day | ORAL | Status: DC
  Administered 2020-06-04 – 2020-06-09 (×6): 400 mg via ORAL
  Filled 2020-06-04 (×7): qty 2

## 2020-06-04 NOTE — Progress Notes (Signed)
Patient ID: Cody Arellano, male   DOB: Jan 23, 1946, 75 y.o.   MRN: [id number]     Advanced Heart Failure Rounding Note  PCP-Cardiologist: Mertie Moores, MD   Subjective:    24/26: Asystolic arrest in CIR, ROSC with 1 dose lidocaine and transferred to [place].  On 2H, had episode of torsades/PMVT preceded by R on T phenomenon in setting of prolonged QT interval.  Terminated by 1 shock.  12/29: Extubated.   He continues on milrinone 0.25, co-ox done yet.  CVP not hooked up.   Denies SOB. Only complaint is soreness in chest post-CPR.  He worked with PT/OT yesterday.  Getting tube feeds and taking po nutrition, followed by dietician.    Objective:   Weight Range: 60.4 kg Body mass index is 18.06 kg/m.   Vital Signs:   Temp:  [97.1 F (36.2 C)-98.1 F (36.7 C)] 97.8 F (36.6 C) (01/01 0827) Pulse Rate:  [60-67] 63 (01/01 0827) Resp:  [16-31] 20 (01/01 0827) BP: (96-122)/(48-71) 122/71 (01/01 0827) SpO2:  [94 %-100 %] 96 % (01/01 0827) Weight:  [60.4 kg] 60.4 kg (12/31 1715) Last BM Date: 06/04/20  Weight change: Filed Weights   06/02/20 0409 06/03/20 0645 06/03/20 1715  Weight: 58.5 kg 57.4 kg 60.4 kg    Intake/Output:   Intake/Output Summary (Last 24 hours) at 06/04/2020 1056 Last data filed at 06/04/2020 0929 Gross per 24 hour  Intake 898.76 ml  Output 700 ml  Net 198.76 ml      Physical Exam   General: NAD Neck: JVP 7-8 cm, no thyromegaly or thyroid nodule.  Lungs: Clear to auscultation bilaterally with normal respiratory effort. CV: Lateral PMI.  Heart regular S1/S2, no S3/S4, 2/6 HSM apex.  No peripheral edema.   Abdomen: Soft, nontender, no hepatosplenomegaly, no distention.  Skin: Intact without lesions or rashes.  Neurologic: Alert and oriented x 3.  Psych: Normal affect. Extremities: No clubbing or cyanosis.  HEENT: Normal.   Telemetry   SR 60s. No VT overnight. Personally reviewed.    Labs    CBC Recent Labs    06/03/20 0351 06/04/20 0346  WBC 8.1  9.2  HGB 9.0* 9.1*  HCT 30.2* 30.4*  MCV 89.6 88.6  PLT 255 834   Basic Metabolic Panel Recent Labs    06/02/20 1700 06/03/20 0351 06/04/20 0346  NA  --  138 138  K  --  4.5 4.5  CL  --  97* 99  CO2  --  29 27  GLUCOSE  --  96 94  BUN  --  45* 40*  CREATININE  --  0.99 1.01  CALCIUM  --  8.5* 8.6*  MG 2.4 2.3  --   PHOS 3.3 3.2  --    Liver Function Tests No results for input(s): AST, ALT, ALKPHOS, BILITOT, PROT, ALBUMIN in the last 72 hours. No results for input(s): LIPASE, AMYLASE in the last 72 hours. Cardiac Enzymes No results for input(s): CKTOTAL, CKMB, CKMBINDEX, TROPONINI in the last 72 hours.  BNP: BNP (last 3 results) Recent Labs    04/26/20 1319 04/30/20 0248 05/31/20 1533  BNP 1,824.4* 1,685.5* 1,995.6*    ProBNP (last 3 results) No results for input(s): PROBNP in the last 8760 hours.   D-Dimer No results for input(s): DDIMER in the last 72 hours. Hemoglobin A1C No results for input(s): HGBA1C in the last 72 hours. Fasting Lipid Panel Recent Labs    06/04/20 0346  TRIG 39   Thyroid Function Tests No results  for input(s): TSH, T4TOTAL, T3FREE, THYROIDAB in the last 72 hours.  Invalid input(s): FREET3  Other results:   Imaging    No results found.   Medications:     Scheduled Medications: . apixaban  5 mg Per Tube BID  . aspirin  81 mg Per Tube Daily  . chlorhexidine gluconate (MEDLINE KIT)  15 mL Mouth Rinse BID  . Chlorhexidine Gluconate Cloth  6 each Topical Daily  . docusate  100 mg Per Tube BID  . feeding supplement  237 mL Oral BID BM  . feeding supplement (PROSource TF)  45 mL Per Tube TID  . mexiletine  150 mg Per Tube Q12H  . pantoprazole sodium  40 mg Per Tube Daily  . polyethylene glycol  17 g Oral Daily  . rosuvastatin  20 mg Per Tube Daily  . sodium chloride flush  3 mL Intravenous Q12H  . spironolactone  25 mg Per Tube Daily  . torsemide  20 mg Oral Daily    Infusions: . sodium chloride 10 mL/hr at  06/04/20 0024  . feeding supplement (VITAL 1.5 CAL) 60 mL/hr at 06/03/20 0600  . milrinone 0.25 mcg/kg/min (06/04/20 0024)  . norepinephrine (LEVOPHED) Adult infusion      PRN Medications: sodium chloride, acetaminophen, ondansetron (ZOFRAN) IV, sodium chloride flush, sodium chloride flush   Assessment/Plan   1. Cardiac arrest: Had VT episode 12/7, required DCCV.  50/09 had asystolic arrest in CIR required 1 dose epinephrine and brief CPR.  Torsades/PMVT later on 12/27 in setting of markedly prolonged QT interval and R on T phenomenon, lidocaine started (no amiodarone with markedly long QT). K and Mg normal.  ECG 12/29 QTc prolonged 506 msec. Echo unchanged, head CT negative, HS-TnI mildly elevated to peak 190 in setting of arrests. No further VT. - Off lidocaine gtt and switched to mexiletine 150 mg twice a day, continue.  2. Chronic systolic CHF/cardiogenic shock: Ischemic cardiomyopathy. Echo this admission looks worse than 10/21 with EF 20-25% with septal akinesis, mid to apical inferior akinesis, apical anterior and apex akinesis, moderately dilated and dysfunctional RV, moderate TR, severe MR, bicuspid aortic valve with no stenosis, mild aortic insufficiency. Progressive/end-stage CHF worsened by onset of atrial fibrillation. Cardiogenic shock 11/30 with co-ox down to 30%, Impella 5.5 placed to allow time to stabilize and consider further steps. Impella extracted 12/13. 0.125 milrinone added post explant for marginal co-ox. Remains on  milrinone 0.125. He has end-stage biventricular HF. He is not a transplant candidate. Barriers to LVAD include nutritional status/deconditioning and RV. I think RV would be acceptable. We discussed in [place], was not LVAD candidate at that point with malnutrition/deconditioning. He went to CIR for aggressive PT, had improved nutritional and functional status and was going to go home but had arrest. Repeat echo post-arrest with EF 25%, mildly decreased RV function,  moderate MR.  Volume status looks ok.  - Continue milrinone 0.25, send co-ox today.   - Continue torsemide 20 mg daily.   - Continue spironolactone 25 daily.  - Start Farxiga 10 daily.  - No digoxin with intermittent junctional rhythm.  - At this point, options appear to be home with hospice care or try to get him strengthened to where he can have LVAD placed.  Have discussed this with his wife.  He now has tube feeds going again and is taking po nutrition as well.  He is working with PT/OT.  Would like to get him back to CIR for additional aggressive PT to see  if we can get him to the point where surgery would be feasible.  Will discuss at [facility].  3. CAD: Prior history of MI with LAD and ramus PCI. AdmittedMay 2021with anterior STEMI. LHC with occluded ostial LAD (in-stent), 90% ostial ramus, 60-70% proximal LCx, nondominant RCA. PTCA to LAD and ramus to restore flow, then CABG with LIMA-LAD and SVG-ramus. No chest pain, doubt ACS. Fall in EF from 10/21 to 11/21, but he did not present with ACS. [place] 12/1 showed patient LIMA-LAD, patent native LAD, patent dominant LCx. The SVG-ramus is occluded with severe diffuse in-stent restenosis in proximal ramus. Ramus not intervened upon as this would be a complex intervention and unlikely to markedly improve EF. HS-TnI mildly elevated with cardiac arrest, suspect due to arrest and not ACS.  He has chest tenderness post-CPR.  - Continue statin.  4.  AKI: Mild post-arrest, creatinine stable 1.01 now.    5. Right pleural effusion: S/p thoracentesis, transudative (CHF).  6. Atrial fibrillation/flutter with RVR: DCCV on 12/3, went back to NSR 12/7.In NSR currently.   - Off amiodarone with long QTc.  - Continue mexiletine.  - Continue apixaban. 7. Mitral regurgitation: TEE 12/3 with moderate central MR. Doubt MitraClip will help him much with moderate MR and he is likely too advanced.  8. H/o PE: In 10/21. Keep anticoagulated, on apixaban. No bleeding   9. Bicuspid aortic valve: Mild AI, no AS.  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  - Now that he is extubated and eating again, restart Imuran and hydroxychloroquine.  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  12. Deconditioning: Continue aggressive work with PT/OT.  As above, would like to get him back to CIR.  Will have him screened again for return to CIR.    Length of Stay: 4  Loralie Champagne, MD  06/04/2020, 10:56 AM  Advanced Heart Failure Team Pager [phone] (M-F; 7a - 4p)  Please contact [facility] for night-coverage after hours (4p -7a ) and weekends on [email or web address]

## 2020-06-04 NOTE — Plan of Care (Signed)
Pt. Is currently resting in bed. States he is tired today. Denies any pain. He has gotten up to bedside commode with assist of 1. VS remain within normal limits. Foam dressing on coccyx changed. Pt is tolerated tube feed well and eating PO with no complications. Brynda Rim, RN

## 2020-06-05 ENCOUNTER — Inpatient Hospital Stay (HOSPITAL_COMMUNITY): Payer: No Typology Code available for payment source

## 2020-06-05 LAB — BASIC METABOLIC PANEL
Anion gap: 10 (ref 5–15)
BUN: 38 mg/dL — ABNORMAL HIGH (ref 8–23)
CO2: 26 mmol/L (ref 22–32)
Calcium: 8.6 mg/dL — ABNORMAL LOW (ref 8.9–10.3)
Chloride: 103 mmol/L (ref 98–111)
Creatinine, Ser: 0.88 mg/dL (ref 0.61–1.24)
GFR, Estimated: >60 mL/min (ref >60)
Glucose, Bld: 109 mg/dL — ABNORMAL HIGH (ref 70–99)
Potassium: 4.6 mmol/L (ref 3.5–5.1)
Sodium: 139 mmol/L (ref 135–145)

## 2020-06-05 LAB — CBC
HCT: 31.3 % — ABNORMAL LOW (ref 39.0–52.0)
Hemoglobin: 9.3 g/dL — ABNORMAL LOW (ref 13.0–17.0)
MCH: 26.4 pg (ref 26.0–34.0)
MCHC: 29.7 g/dL — ABNORMAL LOW (ref 30.0–36.0)
MCV: 88.9 fL (ref 80.0–100.0)
Platelets: 222 10*3/uL (ref 150–400)
RBC: 3.52 MIL/uL — ABNORMAL LOW (ref 4.22–5.81)
RDW: 22 % — ABNORMAL HIGH (ref 11.5–15.5)
WBC: 7.9 10*3/uL (ref 4.0–10.5)
nRBC: 0.8 % — ABNORMAL HIGH (ref 0.0–0.2)

## 2020-06-05 LAB — GLUCOSE, CAPILLARY
Glucose-Capillary: 122 mg/dL — ABNORMAL HIGH (ref 70–99)
Glucose-Capillary: 128 mg/dL — ABNORMAL HIGH (ref 70–99)
Glucose-Capillary: 108 mg/dL — ABNORMAL HIGH (ref 70–99)
Glucose-Capillary: 91 mg/dL (ref 70–99)
Glucose-Capillary: 110 mg/dL — ABNORMAL HIGH (ref 70–99)
Glucose-Capillary: 139 mg/dL — ABNORMAL HIGH (ref 70–99)

## 2020-06-05 LAB — TRIGLYCERIDES: Triglycerides: 29 mg/dL (ref <150)

## 2020-06-05 LAB — COOXEMETRY PANEL
Carboxyhemoglobin: 2.3 % — ABNORMAL HIGH (ref 0.5–1.5)
Methemoglobin: 0.8 % (ref 0.0–1.5)
O2 Saturation: 60.8 %
Total hemoglobin: 8.9 g/dL — ABNORMAL LOW (ref 12.0–16.0)

## 2020-06-05 LAB — MAGNESIUM: Magnesium: 2.3 mg/dL (ref 1.7–2.4)

## 2020-06-05 MED ORDER — GUAIFENESIN 200 MG PO TABS
200.0000 mg | ORAL_TABLET | ORAL | Status: DC | PRN
  Administered 2020-06-05 – 2020-06-08 (×4): 200 mg via ORAL
  Filled 2020-06-05 (×6): qty 1

## 2020-06-05 NOTE — Progress Notes (Signed)
Inpatient Rehab Admissions:  Inpatient Rehab Consult received.  I met with patient and son, [name] at the bedside for rehabilitation assessment and to discuss goals and expectations of an inpatient rehab admission.  Pt was asleep so explained CIR goals and expectations with [name].  He acknowledged understanding as pt was a recent CIR patient.  He is interested in pt pursuing CIR again.  Will continue to follow.  Signed: Gayland Curry, [place], [place] Admissions Coordinator [phone]

## 2020-06-05 NOTE — Progress Notes (Signed)
Physical Therapy Treatment Patient Details Name: Cody Arellano MRN: [id number] DOB: Oct 15, 1945 Today's Date: 06/05/2020    History of Present Illness 75 year old man with advanced ischemic cardiomyopathy who was awaiting discharge prior to LVAD implantation.  Had been doing well and had participated in physiotherapy this morning.  Found unresponsive and not breathing.  CPR initiated and regained pulse following 1 dose of epinephrine.  Extubated in ICU.    PT Comments    Pt tolerates treatment well despite recent session with mobility specialist. Pt is able to ambulate for increased distances and requires reduced assistance for transfers without posterior lean this session. Pt does demonstrate loss of balance with fatigue near the end of session. Pt will benefit from continued acute PT POC to improve dynamic balance and activity tolerance. PT continues to recommend discharge home with HHPT and 24/7 assistance.   Follow Up Recommendations  Home health PT;Supervision/Assistance - 24 hour     Equipment Recommendations  Rolling walker with 5" wheels;3in1 (PT)    Recommendations for Other Services       Precautions / Restrictions Precautions Precautions: Fall Restrictions Weight Bearing Restrictions: No    Mobility  Bed Mobility Overal bed mobility: Needs Assistance Bed Mobility: Sidelying to Sit;Sit to Sidelying   Sidelying to sit: Supervision     Sit to sidelying: Supervision    Transfers Overall transfer level: Needs assistance Equipment used: Rolling walker (2 wheeled) Transfers: Sit to/from Stand Sit to Stand: Min guard            Ambulation/Gait Ambulation/Gait assistance: Min guard Gait Distance (Feet): 150 Feet Assistive device: Rolling walker (2 wheeled) Gait Pattern/deviations: Step-through pattern Gait velocity: reduced Gait velocity interpretation: <1.8 ft/sec, indicate of risk for recurrent falls General Gait Details: pt with slowed step-through gait, pt  with one lateral loss of balance with fatigue when navigating in tight quarters in room   Stairs             Wheelchair Mobility    Modified Rankin (Stroke Patients Only)       Balance Overall balance assessment: Needs assistance Sitting-balance support: No upper extremity supported;Feet supported Sitting balance-Leahy Scale: Fair     Standing balance support: Bilateral upper extremity supported Standing balance-Leahy Scale: Poor Standing balance comment: reliant on UE support of RW                            Cognition Arousal/Alertness: Awake/alert Behavior During Therapy: WFL for tasks assessed/performed Overall Cognitive Status: Within Functional Limits for tasks assessed                                        Exercises      General Comments General comments (skin integrity, edema, etc.): VSS on RA. pt incontinent of stool with standing      Pertinent Vitals/Pain Pain Assessment: Faces Faces Pain Scale: Hurts little more Pain Location: chest at incision site Pain Descriptors / Indicators: Sore Pain Intervention(s): Monitored during session    Home Living                      Prior Function            PT Goals (current goals can now be found in the care plan section) Acute Rehab PT Goals Patient Stated Goal: to go home Progress towards PT goals:  Progressing toward goals    Frequency    Min 3X/week      PT Plan Current plan remains appropriate    Co-evaluation              AM-PAC PT "6 Clicks" Mobility   Outcome Measure  Help needed turning from your back to your side while in a flat bed without using bedrails?: A Little Help needed moving from lying on your back to sitting on the side of a flat bed without using bedrails?: A Little Help needed moving to and from a bed to a chair (including a wheelchair)?: A Little Help needed standing up from a chair using your arms (e.g., wheelchair or bedside  chair)?: A Little Help needed to walk in hospital room?: A Little Help needed climbing 3-5 steps with a railing? : A Lot 6 Click Score: 17    End of Session   Activity Tolerance: Patient tolerated treatment well Patient left: in bed;with call bell/phone within reach;with bed alarm set Nurse Communication: Mobility status PT Visit Diagnosis: Muscle weakness (generalized) (M62.81);Difficulty in walking, not elsewhere classified (R26.2)     Time: 7371-0626 PT Time Calculation (min) (ACUTE ONLY): 20 min  Charges:  $Gait Training: 8-22 mins                     Arlyss Gandy, PT, DPT Acute Rehabilitation Pager: [phone]    Arlyss Gandy 06/05/2020, 3:49 PM

## 2020-06-05 NOTE — Progress Notes (Signed)
Mobility Specialist: Progress Note   06/05/20 1428  Mobility  Activity Ambulated in room  Level of Assistance Minimal assist, patient does 75% or more  Assistive Device Front wheel walker  Distance Ambulated (ft) 192 ft  Mobility Response Tolerated fair  Mobility performed by Mobility specialist  $Mobility charge 1 Mobility   Pre-Mobility: 65 HR, 129/69 BP, 97% SpO2 Post-Mobility: 60 HR, 98/81 BP, 97% SpO2  Pt bowel incontinent upon entering room. Pt assisted with pericare and then performed ambulation in room. Pt c/o CP he said was from CPR and feeling dizzy during ambulation. Pt back to bed per request so he can take a nap. Pt expressed interest in walking later this evening with staff, RN notified.   [facility] Rueben Kassim Mobility Specialist

## 2020-06-05 NOTE — Plan of Care (Signed)
Pt. Is currently resting in bed in no acute distress. Vital signs remain within normal limits. CVP reading was 9 this morning. Pt is tolerating tube feeding well and also eating a PO diet. No complaints of pain. PT to get up and begin ambulation this shift. Brynda Rim, RN '

## 2020-06-05 NOTE — Progress Notes (Signed)
Patient ID: Cody Arellano, male   DOB: 01-29-1946, 75 y.o.   MRN: [id number]     Advanced Heart Failure Rounding Note  PCP-Cardiologist: Mertie Moores, MD   Subjective:    51/70: Asystolic arrest in CIR, ROSC with 1 dose lidocaine and transferred to [place].  On 2H, had episode of torsades/PMVT preceded by R on T phenomenon in setting of prolonged QT interval.  Terminated by 1 shock.  12/29: Extubated.   He continues on milrinone 0.25, co-ox 61%.  CVP 5-6.   Tired.  PT did not come by yesterday.  No dyspnea.  Still sore in chest from prior CPR.    Objective:   Weight Range: 60.4 kg Body mass index is 18.06 kg/m.   Vital Signs:   Temp:  [97.6 F (36.4 C)-98.1 F (36.7 C)] 98 F (36.7 C) (01/02 0749) Pulse Rate:  [58-82] 82 (01/02 0749) Resp:  [17-21] 20 (01/02 0749) BP: (113-129)/(64-71) 129/68 (01/02 0749) SpO2:  [96 %-100 %] 97 % (01/02 0749) Last BM Date: (P) 06/04/20  Weight change: Filed Weights   06/02/20 0409 06/03/20 0645 06/03/20 1715  Weight: 58.5 kg 57.4 kg 60.4 kg    Intake/Output:   Intake/Output Summary (Last 24 hours) at 06/05/2020 1049 Last data filed at 06/05/2020 0174 Gross per 24 hour  Intake 3 ml  Output 1200 ml  Net -1197 ml      Physical Exam   General: NAD Neck: No JVD, no thyromegaly or thyroid nodule.  Lungs: Clear to auscultation bilaterally with normal respiratory effort. CV: Nondisplaced PMI.  Heart regular S1/S2, no S3/S4, 1/6 HSM apex.  No peripheral edema.   Abdomen: Soft, nontender, no hepatosplenomegaly, no distention.  Skin: Intact without lesions or rashes.  Neurologic: Alert and oriented x 3.  Psych: Normal affect. Extremities: No clubbing or cyanosis.  HEENT: Normal.    Telemetry   SR 60s. No VT overnight. Personally reviewed.    Labs    CBC Recent Labs    06/04/20 0346 06/05/20 0316  WBC 9.2 7.9  HGB 9.1* 9.3*  HCT 30.4* 31.3*  MCV 88.6 88.9  PLT 225 944   Basic Metabolic Panel Recent Labs    06/02/20 1700  06/03/20 0351 06/04/20 0346 06/05/20 0316  NA  --  138 138 139  K  --  4.5 4.5 4.6  CL  --  97* 99 103  CO2  --  _0 GLUCOSE  --  96 94 109*  BUN  --  45* 40* 38*  CREATININE  --  0.99 1.01 0.88  CALCIUM  --  8.5* 8.6* 8.6*  MG 2.4 2.3  --  2.3  PHOS 3.3 3.2  --   --    Liver Function Tests No results for input(s): AST, ALT, ALKPHOS, BILITOT, PROT, ALBUMIN in the last 72 hours. No results for input(s): LIPASE, AMYLASE in the last 72 hours. Cardiac Enzymes No results for input(s): CKTOTAL, CKMB, CKMBINDEX, TROPONINI in the last 72 hours.  BNP: BNP (last 3 results) Recent Labs    04/26/20 1319 04/30/20 0248 05/31/20 1533  BNP 1,824.4* 1,685.5* 1,995.6*    ProBNP (last 3 results) No results for input(s): PROBNP in the last 8760 hours.   D-Dimer No results for input(s): DDIMER in the last 72 hours. Hemoglobin A1C No results for input(s): HGBA1C in the last 72 hours. Fasting Lipid Panel Recent Labs    06/05/20 0316  TRIG 29   Thyroid Function Tests No results for input(s): TSH,  T4TOTAL, T3FREE, THYROIDAB in the last 72 hours.  Invalid input(s): FREET3  Other results:   Imaging    No results found.   Medications:     Scheduled Medications: . apixaban  5 mg Oral BID  . aspirin  81 mg Oral Daily  . azaTHIOprine  100 mg Oral BID  . chlorhexidine gluconate (MEDLINE KIT)  15 mL Mouth Rinse BID  . Chlorhexidine Gluconate Cloth  6 each Topical Daily  . dapagliflozin propanediol  10 mg Oral Daily  . docusate  100 mg Oral BID  . feeding supplement  237 mL Oral BID BM  . feeding supplement (PROSource TF)  45 mL Per Tube TID  . hydroxychloroquine  400 mg Oral QHS  . mexiletine  150 mg Oral Q12H  . pantoprazole sodium  40 mg Oral Daily  . polyethylene glycol  17 g Oral Daily  . rosuvastatin  20 mg Oral Daily  . sodium chloride flush  3 mL Intravenous Q12H  . spironolactone  25 mg Oral Daily  . torsemide  20 mg Oral Daily    Infusions: . sodium  chloride 250 mL (06/04/20 1650)  . feeding supplement (VITAL 1.5 CAL) 1,000 mL (06/04/20 2340)  . milrinone 0.25 mcg/kg/min (06/04/20 2244)  . norepinephrine (LEVOPHED) Adult infusion      PRN Medications: sodium chloride, acetaminophen, ondansetron (ZOFRAN) IV, sodium chloride flush, sodium chloride flush   Assessment/Plan   1. Cardiac arrest: Had VT episode 12/7, required DCCV.  02/77 had asystolic arrest in CIR required 1 dose epinephrine and brief CPR.  Torsades/PMVT later on 12/27 in setting of markedly prolonged QT interval and R on T phenomenon, lidocaine started (no amiodarone with markedly long QT). K and Mg normal.  ECG 12/29 QTc prolonged 506 msec. Echo unchanged, head CT negative, HS-TnI mildly elevated to peak 190 in setting of arrests. No further VT. - Off lidocaine gtt and switched to mexiletine 150 mg twice a day, continue.  2. Chronic systolic CHF/cardiogenic shock: Ischemic cardiomyopathy. Echo this admission looks worse than 10/21 with EF 20-25% with septal akinesis, mid to apical inferior akinesis, apical anterior and apex akinesis, moderately dilated and dysfunctional RV, moderate TR, severe MR, bicuspid aortic valve with no stenosis, mild aortic insufficiency. Progressive/end-stage CHF worsened by onset of atrial fibrillation. Cardiogenic shock 11/30 with co-ox down to 30%, Impella 5.5 placed to allow time to stabilize and consider further steps. Impella extracted 12/13. 0.125 milrinone added post explant for marginal co-ox. Remains on  milrinone 0.125. He has end-stage biventricular HF. He is not a transplant candidate. Barriers to LVAD include nutritional status/deconditioning and RV. I think RV would be acceptable. We discussed in [place], was not LVAD candidate at that point with malnutrition/deconditioning. He went to CIR for aggressive PT, had improved nutritional and functional status and was going to go home but had arrest. Repeat echo post-arrest with EF 25%, mildly  decreased RV function, moderate MR.  CVP 5 today with good co-ox.  - Continue milrinone 0.25.  - Decrease torsemide to 10 mg daily.    - Continue spironolactone 25 daily.  - Continue Farxiga 10 daily.  - No digoxin with intermittent junctional rhythm.  - At this point, options appear to be home with hospice care or try to get him strengthened to where he can have LVAD placed.  Have discussed this with his wife.  He now has tube feeds going again and is taking po nutrition as well.  He is working with PT/OT.  Would  like to get him back to CIR for additional aggressive PT to see if we can get him to the point where surgery would be feasible.  Will discuss at [facility].  3. CAD: Prior history of MI with LAD and ramus PCI. AdmittedMay 2021with anterior STEMI. LHC with occluded ostial LAD (in-stent), 90% ostial ramus, 60-70% proximal LCx, nondominant RCA. PTCA to LAD and ramus to restore flow, then CABG with LIMA-LAD and SVG-ramus. No chest pain, doubt ACS. Fall in EF from 10/21 to 11/21, but he did not present with ACS. [place] 12/1 showed patient LIMA-LAD, patent native LAD, patent dominant LCx. The SVG-ramus is occluded with severe diffuse in-stent restenosis in proximal ramus. Ramus not intervened upon as this would be a complex intervention and unlikely to markedly improve EF. HS-TnI mildly elevated with cardiac arrest, suspect due to arrest and not ACS.  He has chest tenderness post-CPR.  - Continue statin.  4.  AKI: Mild post-arrest, creatinine stable 1.01 now.    5. Right pleural effusion: S/p thoracentesis, transudative (CHF).  6. Atrial fibrillation/flutter with RVR: DCCV on 12/3, went back to NSR 12/7.In NSR currently.   - Off amiodarone with long QTc.  - Continue mexiletine.  - Continue apixaban. 7. Mitral regurgitation: TEE 12/3 with moderate central MR. Doubt MitraClip will help him much with moderate MR and he is likely too advanced.  8. H/o PE: In 10/21. Keep anticoagulated, on  apixaban. No bleeding  9. Bicuspid aortic valve: Mild AI, no AS.  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  - Continue Imuran and hydroxychloroquine.  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  12. Deconditioning: Needs mobilization.  Talked to nurse about making sure to get him up and walking.  Hopefully PT can see. Waiting for CIR consult, he is ready to return to CIR.     Length of Stay: Jean [place]itte, MD  06/05/2020, 10:49 AM  Advanced Heart Failure Team Pager [phone] (M-F; 7a - 4p)  Please contact [facility] for night-coverage after hours (4p -7a ) and weekends on [email or web address]

## 2020-06-06 ENCOUNTER — Inpatient Hospital Stay (HOSPITAL_COMMUNITY): Payer: No Typology Code available for payment source

## 2020-06-06 LAB — PSA: Prostatic Specific Antigen: 3.34 ng/mL (ref 0.00–4.00)

## 2020-06-06 LAB — LIPID PANEL
Cholesterol: 92 mg/dL (ref 0–200)
HDL: 52 mg/dL (ref >40)
LDL Cholesterol: 34 mg/dL (ref 0–99)
Total CHOL/HDL Ratio: 1.8 RATIO
Triglycerides: 28 mg/dL (ref <150)
VLDL: 6 mg/dL (ref 0–40)

## 2020-06-06 LAB — CBC
HCT: 30.7 % — ABNORMAL LOW (ref 39.0–52.0)
HCT: 28.7 % — ABNORMAL LOW (ref 39.0–52.0)
Hemoglobin: 9.1 g/dL — ABNORMAL LOW (ref 13.0–17.0)
Hemoglobin: 8.9 g/dL — ABNORMAL LOW (ref 13.0–17.0)
MCH: 26.4 pg (ref 26.0–34.0)
MCH: 27.6 pg (ref 26.0–34.0)
MCHC: 29.6 g/dL — ABNORMAL LOW (ref 30.0–36.0)
MCHC: 31 g/dL (ref 30.0–36.0)
MCV: 89 fL (ref 80.0–100.0)
MCV: 88.9 fL (ref 80.0–100.0)
Platelets: 249 10*3/uL (ref 150–400)
Platelets: 246 10*3/uL (ref 150–400)
RBC: 3.45 MIL/uL — ABNORMAL LOW (ref 4.22–5.81)
RBC: 3.23 MIL/uL — ABNORMAL LOW (ref 4.22–5.81)
RDW: 21.9 % — ABNORMAL HIGH (ref 11.5–15.5)
RDW: 21.4 % — ABNORMAL HIGH (ref 11.5–15.5)
WBC: 9.4 10*3/uL (ref 4.0–10.5)
WBC: 8.8 10*3/uL (ref 4.0–10.5)
nRBC: 1 % — ABNORMAL HIGH (ref 0.0–0.2)
nRBC: 1 % — ABNORMAL HIGH (ref 0.0–0.2)

## 2020-06-06 LAB — PROTIME-INR
INR: 1.6 — ABNORMAL HIGH (ref 0.8–1.2)
Prothrombin Time: 18.5 seconds — ABNORMAL HIGH (ref 11.4–15.2)

## 2020-06-06 LAB — GLUCOSE, CAPILLARY
Glucose-Capillary: 114 mg/dL — ABNORMAL HIGH (ref 70–99)
Glucose-Capillary: 151 mg/dL — ABNORMAL HIGH (ref 70–99)
Glucose-Capillary: 105 mg/dL — ABNORMAL HIGH (ref 70–99)
Glucose-Capillary: 125 mg/dL — ABNORMAL HIGH (ref 70–99)
Glucose-Capillary: 114 mg/dL — ABNORMAL HIGH (ref 70–99)

## 2020-06-06 LAB — HEPATITIS C ANTIBODY: HCV Ab: NONREACTIVE

## 2020-06-06 LAB — T4, FREE: Free T4: 1.5 ng/dL — ABNORMAL HIGH (ref 0.61–1.12)

## 2020-06-06 LAB — ANTITHROMBIN III: AntiThromb III Func: 96 % (ref 75–120)

## 2020-06-06 LAB — LACTATE DEHYDROGENASE: LDH: 176 U/L (ref 98–192)

## 2020-06-06 LAB — HEMOGLOBIN A1C
Hgb A1c MFr Bld: 5 % (ref 4.8–5.6)
Mean Plasma Glucose: 96.8 mg/dL

## 2020-06-06 LAB — URIC ACID: Uric Acid, Serum: 2.7 mg/dL — ABNORMAL LOW (ref 3.7–8.6)

## 2020-06-06 LAB — APTT: aPTT: 36 seconds (ref 24–36)

## 2020-06-06 LAB — PREALBUMIN: Prealbumin: 17.9 mg/dL — ABNORMAL LOW (ref 18–38)

## 2020-06-06 LAB — COOXEMETRY PANEL
Carboxyhemoglobin: 1.9 % — ABNORMAL HIGH (ref 0.5–1.5)
Methemoglobin: 0.7 % (ref 0.0–1.5)
O2 Saturation: 54.2 %
Total hemoglobin: 9.2 g/dL — ABNORMAL LOW (ref 12.0–16.0)

## 2020-06-06 LAB — PROCALCITONIN: Procalcitonin: 0.14 ng/mL

## 2020-06-06 LAB — HEPATITIS B CORE ANTIBODY, IGM: Hep B C IgM: NONREACTIVE

## 2020-06-06 LAB — TRIGLYCERIDES: Triglycerides: 29 mg/dL (ref <150)

## 2020-06-06 MED ORDER — VANCOMYCIN HCL 750 MG/150ML IV SOLN
750.0000 mg | Freq: Two times a day (BID) | INTRAVENOUS | Status: DC
  Administered 2020-06-06 – 2020-06-07 (×3): 750 mg via INTRAVENOUS
  Filled 2020-06-06 (×4): qty 150

## 2020-06-06 MED ORDER — IOHEXOL 9 MG/ML PO SOLN
500.0000 mL | ORAL | Status: AC
  Administered 2020-06-06 (×2): 500 mL via ORAL

## 2020-06-06 MED ORDER — IOHEXOL 300 MG/ML  SOLN
100.0000 mL | Freq: Once | INTRAMUSCULAR | Status: AC | PRN
  Administered 2020-06-06: 100 mL via INTRAVENOUS

## 2020-06-06 MED ORDER — SODIUM CHLORIDE 0.9 % IV SOLN
2.0000 g | Freq: Two times a day (BID) | INTRAVENOUS | Status: DC
  Administered 2020-06-06 – 2020-06-10 (×9): 2 g via INTRAVENOUS
  Filled 2020-06-06 (×9): qty 2

## 2020-06-06 NOTE — Progress Notes (Addendum)
CARDIAC REHAB PHASE I   PRE:  Rate/Rhythm: 62 SR  BP:  Sitting: 129/68      SaO2: 97 RA  MODE:  Ambulation: 375 ft   POST:  Rate/Rhythm: 69 SR  BP:  Sitting: 118/66    SaO2: 96 RA  Pt motivated to walk. Pt ambulated 33ft in hallway assist of two for safety and equipment with rollator. Pt denies SOB, or dizziness. Does c/o some chest soreness and fatigue at end of walk. After some convincing, pt returned to recliner. Call bell and bedside table within reach. Encouraged continued ambulation and coughing.  [id number] Reynold Bowen, RN BSN 06/06/2020 12:19 PM

## 2020-06-06 NOTE — Progress Notes (Addendum)
Patient ID: Cody Arellano, male   DOB: [birth year], 75 y.o.   MRN: [id number]     Advanced Heart Failure Rounding Note  PCP-Cardiologist: Mertie Moores, MD   Subjective:    24/40: Asystolic arrest in CIR, ROSC with 1 dose lidocaine and transferred to [place].  On 2H, had episode of torsades/PMVT preceded by R on T phenomenon in setting of prolonged QT interval.  Terminated by 1 shock.  12/29: Extubated.   He continues on milrinone 0.25, co-ox 54%.  CVP 6-7  Complains of chest wall soreness and cough + congestion. Coughing up phlegm. CXR last PM showed bibasilar airspace disease c/w pna or edema. He is currently AF. WBC nl.    No further VT.   Objective:   Weight Range: 57.2 kg Body mass index is 17.1 kg/m.   Vital Signs:   Temp:  [97.6 F (36.4 C)-98.2 F (36.8 C)] 97.7 F (36.5 C) (01/03 0426) Pulse Rate:  [59-82] 59 (01/03 0635) Resp:  [20-28] 28 (01/03 0635) BP: (114-129)/(56-71) 123/71 (01/03 0426) SpO2:  [96 %-99 %] 97 % (01/03 0635) Weight:  [57.2 kg] 57.2 kg (01/03 0635) Last BM Date: 06/06/20  Weight change: Filed Weights   06/03/20 0645 06/03/20 1715 06/06/20 0635  Weight: 57.4 kg 60.4 kg 57.2 kg    Intake/Output:   Intake/Output Summary (Last 24 hours) at 06/06/2020 0713 Last data filed at 06/06/2020 1027 Gross per 24 hour  Intake 1917.31 ml  Output 2300 ml  Net -382.69 ml      Physical Exam   PHYSICAL EXAM: CVP 6-7 General:  frail appearing AAM. No respiratory difficulty HEENT: normal + cor trak  Neck: supple.  JVD 7 cm. Carotids 2+ bilat; no bruits. No lymphadenopathy or thyromegaly appreciated. Cor: PMI nondisplaced. Regular rate & rhythm. 3/6 MR murmur at apex Abdomen: soft, nontender, nondistended. No hepatosplenomegaly. No bruits or masses. Good bowel sounds. Extremities: thin extremities no cyanosis, clubbing, rash, edema, + RUE PICC Neuro: alert & oriented x 3, cranial nerves grossly intact. moves all 4 extremities w/o difficulty. Affect  pleasant. GU: + foley    Telemetry   Normal sinus, 60s. No arrhythmias . Personally reviewed.   Labs    CBC Recent Labs    06/05/20 0316 06/06/20 0604  WBC 7.9 9.4  HGB 9.3* 9.1*  HCT 31.3* 30.7*  MCV 88.9 89.0  PLT 222 253   Basic Metabolic Panel Recent Labs    06/04/20 0346 06/05/20 0316  NA 138 139  K 4.5 4.6  CL 99 103  CO2 27 26  GLUCOSE 94 109*  BUN 40* 38*  CREATININE 1.01 0.88  CALCIUM 8.6* 8.6*  MG  --  2.3   Liver Function Tests No results for input(s): AST, ALT, ALKPHOS, BILITOT, PROT, ALBUMIN in the last 72 hours. No results for input(s): LIPASE, AMYLASE in the last 72 hours. Cardiac Enzymes No results for input(s): CKTOTAL, CKMB, CKMBINDEX, TROPONINI in the last 72 hours.  BNP: BNP (last 3 results) Recent Labs    04/26/20 1319 04/30/20 0248 05/31/20 1533  BNP 1,824.4* 1,685.5* 1,995.6*    ProBNP (last 3 results) No results for input(s): PROBNP in the last 8760 hours.   D-Dimer No results for input(s): DDIMER in the last 72 hours. Hemoglobin A1C No results for input(s): HGBA1C in the last 72 hours. Fasting Lipid Panel Recent Labs    06/05/20 0316  TRIG 29   Thyroid Function Tests No results for input(s): TSH, T4TOTAL, T3FREE, THYROIDAB in the last 72  hours.  Invalid input(s): FREET3  Other results:   Imaging    DG Chest 1 View  Result Date: 06/05/2020 CLINICAL DATA:  Heart failure EXAM: CHEST  1 VIEW COMPARISON:  05/31/2020 FINDINGS: Endotracheal tube removed. NG removed. Feeding tube placed extending into the stomach with the tip not visualized. Right arm PICC tip in the SVC unchanged. Cardiac enlargement. Pulmonary vascular congestion. Bibasilar airspace disease has progressed. This may represent pneumonia or edema. There is a small right pleural effusion. IMPRESSION: Cardiac enlargement with vascular congestion. Bibasilar airspace disease consistent with pneumonia or edema. Electronically Signed   By: Franchot Gallo M.D.    On: 06/05/2020 20:01     Medications:     Scheduled Medications: . apixaban  5 mg Oral BID  . aspirin  81 mg Oral Daily  . azaTHIOprine  100 mg Oral BID  . chlorhexidine gluconate (MEDLINE KIT)  15 mL Mouth Rinse BID  . Chlorhexidine Gluconate Cloth  6 each Topical Daily  . dapagliflozin propanediol  10 mg Oral Daily  . docusate  100 mg Oral BID  . feeding supplement  237 mL Oral BID BM  . feeding supplement (PROSource TF)  45 mL Per Tube TID  . hydroxychloroquine  400 mg Oral QHS  . mexiletine  150 mg Oral Q12H  . pantoprazole sodium  40 mg Oral Daily  . polyethylene glycol  17 g Oral Daily  . rosuvastatin  20 mg Oral Daily  . sodium chloride flush  3 mL Intravenous Q12H  . spironolactone  25 mg Oral Daily  . torsemide  20 mg Oral Daily    Infusions: . sodium chloride 250 mL (06/04/20 1650)  . feeding supplement (VITAL 1.5 CAL) 1,000 mL (06/05/20 1823)  . milrinone 0.25 mcg/kg/min (06/04/20 2244)  . norepinephrine (LEVOPHED) Adult infusion      PRN Medications: sodium chloride, acetaminophen, guaiFENesin, ondansetron (ZOFRAN) IV, sodium chloride flush, sodium chloride flush   Assessment/Plan   1. Cardiac arrest: Had VT episode 12/7, required DCCV.  54/09 had asystolic arrest in CIR required 1 dose epinephrine and brief CPR.  Torsades/PMVT later on 12/27 in setting of markedly prolonged QT interval and R on T phenomenon, lidocaine started (no amiodarone with markedly long QT). K and Mg normal.  ECG 12/29 QTc prolonged 506 msec. Echo unchanged, head CT negative, HS-TnI mildly elevated to peak 190 in setting of arrests. No further VT. - Off lidocaine gtt and switched to mexiletine 150 mg twice a day, continue.  2. Chronic systolic CHF/cardiogenic shock: Ischemic cardiomyopathy. Echo this admission looks worse than 10/21 with EF 20-25% with septal akinesis, mid to apical inferior akinesis, apical anterior and apex akinesis, moderately dilated and dysfunctional RV, moderate TR,  severe MR, bicuspid aortic valve with no stenosis, mild aortic insufficiency. Progressive/end-stage CHF worsened by onset of atrial fibrillation. Cardiogenic shock 11/30 with co-ox down to 30%, Impella 5.5 placed to allow time to stabilize and consider further steps. Impella extracted 12/13. 0.125 milrinone added post explant for marginal co-ox. Remains on  milrinone 0.125. He has end-stage biventricular HF. He is not a transplant candidate. Barriers to LVAD include nutritional status/deconditioning and RV. I think RV would be acceptable. We discussed in [place], was not LVAD candidate at that point with malnutrition/deconditioning. He went to CIR for aggressive PT, had improved nutritional and functional status and was going to go home but had arrest. Repeat echo post-arrest with EF 25%, mildly decreased RV function, moderate MR.  CVP 6-7 today. Co-ox 54%.  -  Continue milrinone 0.25.  - Continue torsemide 10 mg daily.    - Continue spironolactone 25 daily.  - Continue Farxiga 10 daily.  - No digoxin with intermittent junctional rhythm.  - At this point, options appear to be home with hospice care or try to get him strengthened to where he can have LVAD placed.  Have discussed this with his wife.  He now has tube feeds going again and is taking po nutrition as well.  He is working with PT/OT.  Would like to get him back to CIR for additional aggressive PT to see if we can get him to the point where surgery would be feasible.  Will discuss at [facility].  3. CAD: Prior history of MI with LAD and ramus PCI. AdmittedMay 2021with anterior STEMI. LHC with occluded ostial LAD (in-stent), 90% ostial ramus, 60-70% proximal LCx, nondominant RCA. PTCA to LAD and ramus to restore flow, then CABG with LIMA-LAD and SVG-ramus. No chest pain, doubt ACS. Fall in EF from 10/21 to 11/21, but he did not present with ACS. [place] 12/1 showed patient LIMA-LAD, patent native LAD, patent dominant LCx. The SVG-ramus is occluded with  severe diffuse in-stent restenosis in proximal ramus. Ramus not intervened upon as this would be a complex intervention and unlikely to markedly improve EF. HS-TnI mildly elevated with cardiac arrest, suspect due to arrest and not ACS.  He has chest tenderness post-CPR. No ischemic CP.  - Continue statin.  4.  AKI: Mild post-arrest. Resolved.   SCr 0.88 on yesterday's labs 5. Right pleural effusion: S/p thoracentesis, transudative (CHF).  6. Atrial fibrillation/flutter with RVR: DCCV on 12/3, went back to NSR 12/7.In NSR currently.   - Off amiodarone with long QTc.  - Continue mexiletine.  - Continue apixaban. 7. Mitral regurgitation: TEE 12/3 with moderate central MR. Doubt MitraClip will help him much with moderate MR and he is likely too advanced.  8. H/o PE: In 10/21. Keep anticoagulated, on apixaban. No bleeding  9. Bicuspid aortic valve: Mild AI, no AS.  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  - Continue Imuran and hydroxychloroquine.  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  12. Deconditioning: Needs mobilization.  Talked to nurse about making sure to get him up and walking.  Hopefully PT can see. Waiting for CIR consult, he is ready to return to CIR.    13. Cough/ Congestion: Coughing up phlegm. CXR last PM showed bibasilar airspace disease c/w pna or edema. He is currently AF. WBC nl. - add mucolytic   - check PTC to guide abx therapy   Length of Stay: [address], PA-C  06/06/2020, 7:13 AM  Advanced Heart Failure Team Pager [phone] (M-F; [place])  Please contact [facility] for night-coverage after hours (4p -7a ) and weekends on [email or web address]  Patient seen with PA, agree with the above note .  Mild cough with some sputum, no dyspnea.  WBCs normal and afebrile.  CXR with RLL infiltrate.    Walked out in hall yesterday.  No dyspnea.   He remains in NSR.   General: NAD Neck: No JVD, no thyromegaly or thyroid nodule.  Lungs: Mild  crackles right base.  CV: Nondisplaced PMI.  Heart regular S1/S2, no S3/S4, no murmur.  No peripheral edema.   Abdomen: Soft, nontender, no hepatosplenomegaly, no distention.  Skin: Intact without lesions or rashes.  Neurologic: Alert and oriented x 3.  Psych: Normal affect. Extremities: No clubbing or cyanosis.  HEENT: Normal.  I suspect that he aspirated with arrest, now with RLL PNA on CXR.  He is afebrile with normal WBCs.  - Will send procalcitonin.  - Cover with abx for hospital-acquired PNA.    CVP 6-7 with co-ox 54%, will not change other meds.   Continue workup towards LVAD.  Continue tube feeds supplementing po.  Continue aggressive work with PT.  Think he can go to CIR today if there is a bed.   Loralie Champagne 06/06/2020 11:01 AM

## 2020-06-06 NOTE — Discharge Summary (Incomplete)
Advanced Heart Failure Team  Discharge Summary   Patient ID: Cody Arellano MRN: [id number], DOB/AGE: 75-03-1946 75 y.o. Admit date: 05/31/2020 D/C date:     06/06/2020   Primary Discharge Diagnoses:  1. Cardiac arrest: Had VT episode 12/7, required DCCV.  50/35 had asystolic arrest in CIR  2. Chronic systolic CHF/cardiogenic shock: Ischemic cardiomyopathy 3.CAD: Prior history of MI with LAD and ramus PCI.  4. AKI 5. Right Pleural Effusion  6. PAF/ Fib/Flutter 7. Mitral Regurgitation 8. H/O PE 9. Bicuspid Aortic Valve 10. Pulmonary Nodules/Empysema 12. Deconditioning  13. RLL PNA   Hospital Course:  75 y/o male with complicated PMHx including SLE, COPD, CAD s/p previous PCIs.   First admittedearlier this year in5/21 with anterior STEMI found to have thrombotic occlusion of ostial LAD stent + 90% ISR in ostial ramus with EF 25-30%. L dominant system. Underwent PTCA of LAD lesion with reduction 100% ->50% however unable to stent ramus due to protrusion of the previous ramus stents into LM. IABP placed and taken for emergent CABG with LIMA ->LAD and SVG to RI (Dr. Roxy Manns) Echo in 5/21 EF 45-50% (25% at cath). Post-op course c/b PAF and started on amio (which was eventually stopped).   Admitted 11/23/21with worsening HF symptoms andfailure to thrive. Underwent CT chest with poor opacification of bilateral LL pulmonary arteries. Large right effusion, persistent volume overload/HF and increasing pulmonary nodules. Underwent thoracentesis and bronch/biopsy. Echo obtained and EF read as 30-35%(felt closer to 20-25%on MD reviewwith moderate RV dysfunction and severe MR, mod-severe TE). Progressed to cardiogenic shock.PICC placed.InitialCo-ox 41%. Started on milrinone and increased to 0.375. Developed AFL with RVR. Started on IV amio.Ultimately required mechanical support. Implella 5.5 placed on 11/30. Hemodynamics and renal function improved. Impella extracted 12/13. Milrinone restarted  post extraction for marginal co-ox. Developed hypotension and losartan and spiro discontinued. Midodrine added to support BP. Other medical problems addressed this admit included RLL PNA treated w/ abx and VT requiring DCCV. Regarding long term options for heart failure, he is not a transplant candidate.Barriers to LVAD include nutritional status/deconditioning.Discharged to CIR on on 05/23/20.   He had been doing well on CIR and was set up for discharge 06/01/20 but had witness cardiac arrest. Monitor showed asystole with CPR started immediately. Given 1 dose of epinephrine with ROSC. Readmitted to ICU and placed lidocaine and later transitioned to mexiletine.   See below for detailed problem list. Discharge to CIR in stable condition on milrinone 0.25 mcg.    1. Cardiac arrest: Had VT episode 12/7, required DCCV.  46/56 had asystolic arrest in CIR required 1 dose epinephrine and brief CPR.  Torsades/PMVT later on 12/27 in setting of markedly prolonged QT interval and R on T phenomenon, lidocaine started (no amiodarone with markedly long QT). K and Mg normal.  ECG 12/29 QTc prolonged 506 msec. Echo unchanged, head CT negative, HS-TnI mildly elevated to peak 190 in setting of arrests. No further VT. - Off lidocaine gtt and switched to mexiletine 150 mg twice a day, continue.  2. Chronic systolic CHF/cardiogenic shock: Ischemic cardiomyopathy. Echo this admission looks worse than 10/21 with EF 20-25% with septal akinesis, mid to apical inferior akinesis, apical anterior and apex akinesis, moderately dilated and dysfunctional RV, moderate TR, severe MR, bicuspid aortic valve with no stenosis, mild aortic insufficiency. Progressive/end-stage CHF worsened by onset of atrial fibrillation. Cardiogenic shock 11/30 with co-ox down to 30%, Impella 5.5 placed to allow time to stabilize and consider further steps. Impella extracted 12/13. 0.125 milrinone  added post explant for marginal co-ox. Remains on  milrinone  0.125. He has end-stage biventricular HF. He is not a transplant candidate. Barriers to LVAD include nutritional status/deconditioning and RV. I think RV would be acceptable. We discussed in [place], was not LVAD candidate at that point with malnutrition/deconditioning. He went to CIR for aggressive PT, had improved nutritional and functional status and was going to go home but had arrest. Repeat echo post-arrest with EF 25%, mildly decreased RV function, moderate MR.  CVP 6-7 today. Co-ox 54%.  - Continue milrinone 0.25.  - Continue torsemide 10 mg daily.    - Continue spironolactone 25 daily.  - Continue Farxiga 10 daily.  - No digoxin with intermittent junctional rhythm.  - At this point, options appear to be home with hospice care or try to get him strengthened to where he can have LVAD placed.  Have discussed this with his wife.  He now has tube feeds going again and is taking po nutrition as well.  He is working with PT/OT.  Would like to get him back to CIR for additional aggressive PT to see if we can get him to the point where surgery would be feasible.  Will discuss at [facility].  3. CAD: Prior history of MI with LAD and ramus PCI. AdmittedMay 2021with anterior STEMI. LHC with occluded ostial LAD (in-stent), 90% ostial ramus, 60-70% proximal LCx, nondominant RCA. PTCA to LAD and ramus to restore flow, then CABG with LIMA-LAD and SVG-ramus. No chest pain, doubt ACS. Fall in EF from 10/21 to 11/21, but he did not present with ACS. [place] 12/1 showed patient LIMA-LAD, patent native LAD, patent dominant LCx. The SVG-ramus is occluded with severe diffuse in-stent restenosis in proximal ramus. Ramus not intervened upon as this would be a complex intervention and unlikely to markedly improve EF. HS-TnI mildly elevated with cardiac arrest, suspect due to arrest and not ACS.  He has chest tenderness post-CPR. No ischemic CP.  - Continue statin.  4.AKI: Mild post-arrest. Resolved.   5. Right pleural  effusion: S/p thoracentesis, transudative (CHF). 6. Atrial fibrillation/flutter with RVR: DCCV on 12/3, went back to NSR 12/7.In NSR currently.   - Off amiodarone with long QTc.  - Continue mexiletine.  - Continue apixaban. 7. Mitral regurgitation: TEE 12/3 with moderate central MR. Doubt MitraClip will help him much with moderate MR and he is likely too advanced. 8. H/o PE: In 10/21. Keep anticoagulated, on apixaban. No bleeding 9.Bicuspid aortic valve: Mild AI, no AS.  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  - Continue Imuran and hydroxychloroquine.  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  12. Deconditioning:  PT./OT for ongoing strengthening.     13. Cough/ Congestion: Coughing up phlegm. CXR last PM showed bibasilar airspace disease c/w pna or edema. He is currently AF. WBC nl. - add mucolytic   - Pro calcitonin was stable.   Discharge Vitals: Blood pressure 118/66, pulse 65, temperature 98 F (36.7 C), temperature source Oral, resp. rate 17, height 6' (1.829 m), weight 57.2 kg, SpO2 96 %.  Labs: Lab Results  Component Value Date   WBC 9.4 06/06/2020   HGB 9.1 (L) 06/06/2020   HCT 30.7 (L) 06/06/2020   MCV 89.0 06/06/2020   PLT 249 06/06/2020    Recent Labs  Lab 06/01/20 0341 06/01/20 0350 06/05/20 0316  NA 139   < > 139  K 4.9   < > 4.6  CL 100   < > 103  CO2 28   < >  26  BUN 37*   < > 38*  CREATININE 1.43*   < > 0.88  CALCIUM 9.0   < > 8.6*  PROT 6.6  --   --   BILITOT 0.9  --   --   ALKPHOS 61  --   --   ALT 96*  --   --   AST 99*  --   --   GLUCOSE 146*   < > 109*   < > = values in this interval not displayed.   Lab Results  Component Value Date   CHOL 113 10/17/2019   HDL 45 10/17/2019   LDLCALC 58 10/17/2019   TRIG 29 06/06/2020   BNP (last 3 results) Recent Labs    04/26/20 1319 04/30/20 0248 05/31/20 1533  BNP 1,824.4* 1,685.5* 1,995.6*    ProBNP (last 3 results) No results for input(s): PROBNP in the last  8760 hours.   Diagnostic Studies/Procedures   DG Orthopantogram  Result Date: 06/06/2020 CLINICAL DATA:  Preoperative evaluation of a medical condition to r/o surgical contraindications (TAR required). Best obtainable images. NG tube is visible on exam. EXAM: ORTHOPANTOGRAM/PANORAMIC COMPARISON:  None. FINDINGS: No fracture or bone lesion. No visualized periapical lucency to suggest a root abscess. No definitive dental caries. Soft tissues are unremarkable. IMPRESSION: 1. No fracture or bone lesion. 2. No radiographic evidence root abscess and no convincing dental carie Electronically Signed   By: Lajean Manes M.D.   On: 06/06/2020 13:32   DG Chest 1 View  Result Date: 06/05/2020 CLINICAL DATA:  Heart failure EXAM: CHEST  1 VIEW COMPARISON:  05/31/2020 FINDINGS: Endotracheal tube removed. NG removed. Feeding tube placed extending into the stomach with the tip not visualized. Right arm PICC tip in the SVC unchanged. Cardiac enlargement. Pulmonary vascular congestion. Bibasilar airspace disease has progressed. This may represent pneumonia or edema. There is a small right pleural effusion. IMPRESSION: Cardiac enlargement with vascular congestion. Bibasilar airspace disease consistent with pneumonia or edema. Electronically Signed   By: Franchot Gallo M.D.   On: 06/05/2020 20:01    Discharge Medications   Allergies as of 06/06/2020   No Known Allergies   Med Rec must be completed prior to using this [facility]***       Disposition   The patient will be discharged in stable condition to [facility].       Duration of Discharge Encounter: Greater than 35 minutes   Signed, Carey Johndrow  06/06/2020, 2:42 PM

## 2020-06-06 NOTE — Progress Notes (Signed)
Pharmacy Antibiotic Note  Cody Arellano is a 75 y.o. male admitted on 05/31/2020 from CIR after PEA arrest in CIR and then VT arrest in CVICU - now resolved.  Cxr looks like pneumonia, WBC WNL, afebrile, Cr 0.88 Crcl 71ml/min.   Pharmacy has been consulted for vancomycin and cefepime dosing.  Plan: Vancomycin 750mg  IV q12h Cefepime 2gm IV q12h  Height: 6' (182.9 cm) Weight: 57.2 kg (126 lb 1.7 oz) IBW/kg (Calculated) : 77.6  Temp (24hrs), Avg:97.9 F (36.6 C), Min:97.7 F (36.5 C), Max:98.2 F (36.8 C)  Recent Labs  Lab 05/31/20 1534 05/31/20 1830 05/31/20 2005 05/31/20 2243 06/01/20 0341 06/02/20 0343 06/02/20 0818 06/02/20 1230 06/03/20 0351 06/04/20 0346 06/05/20 0316 06/06/20 0604  WBC  --   --  12.2*  --  8.6  --  7.2  --  8.1 9.2 7.9 9.4  CREATININE  --   --  1.63*  --  1.43* 1.19  --  1.19 0.99 1.01 0.88  --   LATICACIDVEN 5.0* 3.9* 5.6* 4.6* 2.5*  --   --   --   --   --   --   --     Estimated Creatinine Clearance: 59.6 mL/min (by C-G formula based on SCr of 0.88 mg/dL).    No Known Allergies  Antimicrobials this admission:   Dose adjustments this admission:   Microbiology results:   08/04/20 Pharm.D. CPP, BCPS Clinical Pharmacist [phone] 06/06/2020 12:14 PM

## 2020-06-06 NOTE — Progress Notes (Signed)
[facility] EDUCATION NOTE:                VAD evaluation consent reviewed and signed by Mr. Vaugh.   VAD educational packet including "Understanding Your Options with Advanced Heart Failure", "[place] Patient Agreement for VAD Evaluation and Potential Implantation" consent, and [employer] "Living a More Active Life" HM III booklet", "[place] HM III Patient Education", "[place] Mechanical Circulatory Support Program", and "Decision Aids for Left Ventricular Assist Device" reviewed in detail and left at bedside for continued reference.  All questions answered regarding VAD implant, hospital stay, and what to expect when discharged home living with a heart pump. Pt identified his wife Karna Christmas as his primary caregiver and his son [name] as back-up if this therapy should be deemed appropriate for him.  Explained need for 24/7 care when pt is discharged home due to sternal precautions, adaptation to living on support, emotional support, consistent and meticulous exit site care and management, medication adherence and high volume of follow up visits with the [facility] after discharge; both pt and caregiver verbalized understanding of above.   Explained that LVAD can be implanted for two indications in the setting of advanced left ventricular heart failure treatment:  1. Bridge to transplant - used for patients who cannot safely wait for heart transplant without this device.  Or    2. Destination therapy - used for patients until end of life or recovery of heart function.  Patient and caregiver(s) acknowledge that the indication at this point in time for LVAD therapy would be for DT due to age.   Reviewed and supplied a copy of home inspection check list stressing that only three pronged grounded power outlets can be used for VAD equipment. Mr. Majkowski confirmed home has electrical outlets that will support the equipment along with access working telephone.  Identified the following lifestyle modifications  while living on MCS:    1. No driving for at least three months and then only if doctor gives permission to do so.   2. No tub baths while pump implanted, and shower only when doctor gives permission.   3. No swimming or submersion in water while implanted with pump.   4. No contact sports or engaging in jumping activities.   5. Always have a backup controller, charged spare batteries, and battery clips nearby at all times in case of emergency.   6. Call the doctor or hospital contact person if any change in how the pump sounds, feels, or works.   7. Plan to sleep only when connected to the power module.   8. Do not sleep on your stomach.   9. Keep a backup system controller, charged batteries, battery clips, and flashlight near you during sleep in case of electrical power outage.   10. Exit site care including dressing changes, monitoring for infection, and importance of keeping percutaneous lead stabilized at all times.    Extended the option to have one of our current patients and caregiver(s) come to talk with them about living on support to assist with decision making.   Reviewed pictures of VAD drive line, site care, dressing changes, and drive line stabilization including securement attachment device and abdominal binder. Discussed with pt and family that they will be required to purchase dressing supplies as long as patient has the VAD in place.   [facility] patient survival statistics through June 2021 reviewed with patient and caregiver as follows:  The patient understands that from this discussion it does not mean that they will receive the device, but that depends on an extensive evaluation process. The patient is aware of the fact that if at anytime they want to stop the evaluation process they can.  All questions have been answered at this time and contact information was provided should they encounter any further questions.  They are  both agreeable at this time to the evaluation process and will move forward.    Total Session Time: 30 minutes  Carlton Adam, RN VAD Coordinator   Office: [phone] 24/7 VAD Pager: [phone]

## 2020-06-06 NOTE — Progress Notes (Signed)
Occupational Therapy Treatment Patient Details Name: Cody Arellano MRN: [id number] DOB: [birth year] Today's Date: 06/06/2020    History of present illness 75 year old man with advanced ischemic cardiomyopathy who was awaiting discharge prior to LVAD implantation.  Had been doing well and had participated in physiotherapy this morning.  Found unresponsive and not breathing.  CPR initiated and regained pulse following 1 dose of epinephrine.  Extubated in ICU.   OT comments  Patient continues to make steady progress towards goals in skilled OT session. Patient's session encompassed bed mobility, transfers, and exercises in preparation for walk with cardiac rehab. Pt noted to have increased fatigue to date, and flat affect, requiring increased time to sequence steps and follow commands. Of note, pt sitting on EOB as therapist was arranging lines and leads for transfer and pt flopped back into bed with no warning (less than one minute sitting EOB with BUE supported). When prompted, pt stated he was fatigued and needed to rest. Pt able to complete BLE exercises in sitting and standing with rest breaks in between, and handed off to complete ambulation with cardiac rehab. Due to level of activity tolerance and endurance, therapist continues to recommend CIR in order to address functional deficits. Therapy will continue to follow.    Follow Up Recommendations  CIR;Supervision/Assistance - 24 hour    Equipment Recommendations  3 in 1 bedside commode;Wheelchair (measurements OT);Wheelchair cushion (measurements OT)    Recommendations for Other Services      Precautions / Restrictions Precautions Precautions: Fall Restrictions Weight Bearing Restrictions: No Other Position/Activity Restrictions: External difib       Mobility Bed Mobility Overal bed mobility: Needs Assistance Bed Mobility: Sidelying to Sit   Sidelying to sit: Min assist       General bed mobility comments: increased effort, HOB  elevated, pt utilized rail and therapist to come to sitting  Transfers Overall transfer level: Needs assistance Equipment used: Rolling walker (2 wheeled) Transfers: Sit to/from Stand Sit to Stand: Min assist         General transfer comment: minA for stability and safety with standing from EOB as well as for transfer as pt with posterior lean.  Pt needs constant support for stability.    Balance Overall balance assessment: Needs assistance Sitting-balance support: Bilateral upper extremity supported;Feet supported Sitting balance-Leahy Scale: Poor Sitting balance - Comments: pt often having to reposition for balance, could not maintain longer than a minute before flopping back down to take a break without no warning to therapist Postural control: Posterior lean Standing balance support: Bilateral upper extremity supported Standing balance-Leahy Scale: Poor Standing balance comment: reliant on UE support of RW                           ADL either performed or assessed with clinical judgement   ADL Overall ADL's : Needs assistance/impaired                     Lower Body Dressing: Sitting/lateral leans;Minimal assistance Lower Body Dressing Details (indicate cue type and reason): able to complete, but required gaurding and minimal stabilization to remian upright at EOB Toilet Transfer: Minimal assistance;RW;Ambulation Toilet Transfer Details (indicate cue type and reason): minA for safety and stability with ambulation;simulated with in room mobility         Functional mobility during ADLs: Minimal assistance;Rolling walker;Cueing for safety;Cueing for sequencing General ADL Comments: pt remains limited by activity tolerance and endurance, and required increased  cues for sequencing to date, but A&O x4     Vision       Perception     Praxis      Cognition Arousal/Alertness: Awake/alert Behavior During Therapy: WFL for tasks assessed/performed Overall  Cognitive Status: Impaired/Different from baseline Area of Impairment: Safety/judgement;Following commands                       Following Commands: Follows multi-step commands with increased time;Follows one step commands with increased time Safety/Judgement: Decreased awareness of safety;Decreased awareness of deficits     General Comments: decreased levels of safety noted, minimally impulsive as pt was observed to flop back in the bed when sitting EOB for less than one minute, required increased time to follow commands and sequence        Exercises General Exercises - Lower Extremity Straight Leg Raises: AROM;Strengthening;Both;Seated;10 reps Hip Flexion/Marching: AROM;Both;10 reps;Standing Toe Raises: AROM;Both;10 reps;Seated   Shoulder Instructions       General Comments      Pertinent Vitals/ Pain       Pain Assessment: Faces Faces Pain Scale: No hurt  Home Living                                          Prior Functioning/Environment              Frequency  Min 2X/week        Progress Toward Goals  OT Goals(current goals can now be found in the care plan section)  Progress towards OT goals: Progressing toward goals  Acute Rehab OT Goals Patient Stated Goal: to go home OT Goal Formulation: With patient Time For Goal Achievement: 06/16/20 Potential to Achieve Goals: Fair  Plan Discharge plan remains appropriate    Co-evaluation                 AM-PAC OT "6 Clicks" Daily Activity     Outcome Measure   Help from another person eating meals?: Total Help from another person taking care of personal grooming?: A Little Help from another person toileting, which includes using toliet, bedpan, or urinal?: A Lot Help from another person bathing (including washing, rinsing, drying)?: A Lot Help from another person to put on and taking off regular upper body clothing?: A Little Help from another person to put on and taking  off regular lower body clothing?: A Little 6 Click Score: 14    End of Session Equipment Utilized During Treatment: Rolling walker;Gait belt  OT Visit Diagnosis: Other abnormalities of gait and mobility (R26.89);Muscle weakness (generalized) (M62.81)   Activity Tolerance Patient limited by fatigue   Patient Left in chair;with call bell/phone within reach   Nurse Communication Mobility status        Time: 7654-6503 OT Time Calculation (min): 17 min  Charges: OT General Charges $OT Visit: 1 Visit OT Treatments $Self Care/Home Management : 8-22 mins  Pollyann Glen E. Sally-Ann Cutbirth, COTA/L Acute Rehabilitation Services [phone]   Cherlyn Cushing 06/06/2020, 12:09 PM

## 2020-06-07 DIAGNOSIS — Z7189 Other specified counseling: Secondary | ICD-10-CM

## 2020-06-07 DIAGNOSIS — Z515 Encounter for palliative care: Secondary | ICD-10-CM

## 2020-06-07 DIAGNOSIS — R531 Weakness: Secondary | ICD-10-CM

## 2020-06-07 LAB — BASIC METABOLIC PANEL WITH GFR
Anion gap: 6 (ref 5–15)
BUN: 27 mg/dL — ABNORMAL HIGH (ref 8–23)
CO2: 21 mmol/L — ABNORMAL LOW (ref 22–32)
Calcium: 6.9 mg/dL — ABNORMAL LOW (ref 8.9–10.3)
Chloride: 112 mmol/L — ABNORMAL HIGH (ref 98–111)
Creatinine, Ser: 0.67 mg/dL (ref 0.61–1.24)
GFR, Estimated: >60 mL/min
Glucose, Bld: 104 mg/dL — ABNORMAL HIGH (ref 70–99)
Potassium: 3.4 mmol/L — ABNORMAL LOW (ref 3.5–5.1)
Sodium: 139 mmol/L (ref 135–145)

## 2020-06-07 LAB — CBC
HCT: 25.9 % — ABNORMAL LOW (ref 39.0–52.0)
Hemoglobin: 7.4 g/dL — ABNORMAL LOW (ref 13.0–17.0)
MCH: 26.1 pg (ref 26.0–34.0)
MCHC: 28.6 g/dL — ABNORMAL LOW (ref 30.0–36.0)
MCV: 91.2 fL (ref 80.0–100.0)
Platelets: 211 K/uL (ref 150–400)
RBC: 2.84 MIL/uL — ABNORMAL LOW (ref 4.22–5.81)
RDW: 21.2 % — ABNORMAL HIGH (ref 11.5–15.5)
WBC: 6.7 K/uL (ref 4.0–10.5)
nRBC: 1.2 % — ABNORMAL HIGH (ref 0.0–0.2)

## 2020-06-07 LAB — COOXEMETRY PANEL
Carboxyhemoglobin: 1.9 % — ABNORMAL HIGH (ref 0.5–1.5)
Methemoglobin: 0.9 % (ref 0.0–1.5)
O2 Saturation: 53.8 %
Total hemoglobin: 9.1 g/dL — ABNORMAL LOW (ref 12.0–16.0)

## 2020-06-07 LAB — IRON AND TIBC
Iron: 20 ug/dL — ABNORMAL LOW (ref 45–182)
Saturation Ratios: 5 % — ABNORMAL LOW (ref 17.9–39.5)
TIBC: 381 ug/dL (ref 250–450)
UIBC: 361 ug/dL

## 2020-06-07 LAB — GLUCOSE, CAPILLARY
Glucose-Capillary: 110 mg/dL — ABNORMAL HIGH (ref 70–99)
Glucose-Capillary: 114 mg/dL — ABNORMAL HIGH (ref 70–99)
Glucose-Capillary: 118 mg/dL — ABNORMAL HIGH (ref 70–99)
Glucose-Capillary: 119 mg/dL — ABNORMAL HIGH (ref 70–99)
Glucose-Capillary: 121 mg/dL — ABNORMAL HIGH (ref 70–99)
Glucose-Capillary: 144 mg/dL — ABNORMAL HIGH (ref 70–99)

## 2020-06-07 LAB — FOLATE: Folate: 14.3 ng/mL (ref >5.9)

## 2020-06-07 LAB — FERRITIN: Ferritin: 55 ng/mL (ref 24–336)

## 2020-06-07 LAB — HEPATITIS B SURFACE ANTIGEN: Hepatitis B Surface Ag: NONREACTIVE

## 2020-06-07 LAB — VITAMIN B12: Vitamin B-12: 519 pg/mL (ref 180–914)

## 2020-06-07 LAB — HEPATITIS B SURFACE ANTIBODY, QUANTITATIVE: Hep B S AB Quant (Post): <3.1 m[IU]/mL — ABNORMAL LOW (ref >9.9)

## 2020-06-07 MED ORDER — OSMOLITE 1.5 CAL PO LIQD
1000.0000 mL | ORAL | Status: DC
  Administered 2020-06-07 – 2020-06-08 (×2): 1000 mL
  Filled 2020-06-07 (×5): qty 1000

## 2020-06-07 MED ORDER — POTASSIUM CHLORIDE CRYS ER 20 MEQ PO TBCR
40.0000 meq | EXTENDED_RELEASE_TABLET | Freq: Once | ORAL | Status: AC
  Administered 2020-06-07: 40 meq via ORAL
  Filled 2020-06-07: qty 2

## 2020-06-07 MED ORDER — ENSURE ENLIVE PO LIQD
237.0000 mL | Freq: Three times a day (TID) | ORAL | Status: DC
  Administered 2020-06-07 – 2020-06-09 (×5): 237 mL via ORAL

## 2020-06-07 MED ORDER — PROSOURCE TF PO LIQD
45.0000 mL | Freq: Four times a day (QID) | ORAL | Status: DC
  Administered 2020-06-07 – 2020-06-09 (×6): 45 mL
  Filled 2020-06-07 (×9): qty 45

## 2020-06-07 NOTE — Progress Notes (Signed)
CT Chest concerning for pulmonary nodules concerning for metastatic disease.    Dr Shirlee Latch requesting CCM for pulmonary consult.   Consult requested.   Cody Orsborn NP-C  10:18 AM

## 2020-06-07 NOTE — Progress Notes (Signed)
PT Cancellation Note  Patient Details Name: Cody Arellano MRN: [id number] DOB: 10-07-1945   Cancelled Treatment:    Reason Eval/Treat Not Completed: (P) Fatigue/lethargy limiting ability to participate Pt with increased fatigue from ambulation with [facility] earlier. Pt currently consulting with pulmonologist after CT revealed pulmonary nodules. PT will follow back this afternoon as able.   Mirtha Jain B. Beverely Risen PT, DPT Acute Rehabilitation Services Pager [phone] Office [phone]    Elon Alas Fleet 06/07/2020, 1:35 PM

## 2020-06-07 NOTE — Progress Notes (Signed)
CARDIAC REHAB PHASE I   PRE:  Rate/Rhythm: 64 SR    BP: sitting 115/66    SaO2: 100 RA  MODE:  Ambulation: 240 ft   POST:  Rate/Rhythm: 78 SR    BP: sitting 114/59     SaO2: 97 RA  Pt needed assist to get to EOB. Stood and walked with rollator and gait belt with standby assist. Pt motivated to reach end of hall but had incontinence of stool therefore had to turn around early. To BSC in room for small BM. No major c/o except general fatigue. To recliner. Encouraged more walking and IS (took him one).  [id number]   Harriet Masson CES, ACSM 06/07/2020 11:35 AM

## 2020-06-07 NOTE — Progress Notes (Addendum)
Nutrition Follow Up  DOCUMENTATION CODES:   Underweight,Severe malnutrition in context of chronic illness  INTERVENTION:   Liberalize diet to REGULAR  Transition to nocturnal feedings: -Osmolite 1.5 @ 85 ml x 14 hrs (2000-1000) via Cortrak -Pro-Source TF 45 mL QID Provides 1945 kcals, 119 g of protein, 907 mL of free water Meets 88% kcal and 99% protein needs.   Ensure Enlive po TID, each supplement provides 350 kcal and 20 grams of protein   NUTRITION DIAGNOSIS:   Severe Malnutrition related to chronic illness as evidenced by severe fat depletion,severe muscle depletion.  Ongoing  GOAL:   Patient will meet greater than or equal to 90% of their needs   Met via TF and PO  MONITOR:   Diet advancement,TF tolerance,PO intake,Weight trends  REASON FOR ASSESSMENT:   Consult Enteral/tube feeding initiation and management  ASSESSMENT:   75 yo male admitted from CIR post arrest, pt was awaiting discharge prior to LVAD implantation secondary to advanced ischemic CM. PMH includs lupus, intersitial lung disease, CAD/CABG, CHF  11/30 Impella placed 12/03 Cortrak placed 12/13 Impella removed 12/14 Cortrak removed 12/28 Admit from CIR, cardiac arrest, Intubated 12/29 Cortak placed, Extubated  RD consulted for LVAD evaluation. Noted pulmonary nodules on CT. Will need to have malignancy ruled out prior to proceeding with LVAD insertion. Family has requested a palliative care meeting tomorrow to discuss plan moving forward.  Appetite slightly improved but remains inadequate. Meal completions lack documentation at this time. Pt continues to try drinking three Ensures daily. Has been able to get mostly two a day. Pt tolerating tube feeding at goal rate. Will transition to nocturnal and continue until goals of care are further discussed.   Pt has been unable to maintain dry weight throughout these admissions despite ongoing TF and oral intake. If we are to proceed with aggressive  care/LVAD, pt would benefit from long term feeding tube given the severity of his malnutrition and inability to maintain weight.  Admission weight: 58.4 kg  Current weight: 60.2 kg   UOP: 2200 ml x 24 hrs   Medications: colace, miralax, aldactone, demadex Labs: K 3.4 CBG 114-121  Diet Order:   Diet Order            Diet Heart Room service appropriate? Yes with Assist; Fluid consistency: Thin  Diet effective ____                 EDUCATION NEEDS:   Education needs have been addressed  Skin:  Skin Integrity Issues:: Stage II Stage II: coccyx Other: skin tear to scrotum  Last BM:  1/3  Height:   Ht Readings from Last 1 Encounters:  06/03/20 6' (1.829 m)    Weight:   Wt Readings from Last 1 Encounters:  06/07/20 60.2 kg    BMI:  Body mass index is 18 kg/m.  Estimated Nutritional Needs:   Kcal:  2200-2400 kcals  Protein:  120-140 g  Fluid:  >/= 1.8 L  Mariana Single RD, LDN Clinical Nutrition Pager listed in [place]

## 2020-06-07 NOTE — H&P (Incomplete)
Physical Medicine and Rehabilitation Admission H&P     HPI: Cody Arellano is a 75 year old right-handed male with history significant for lupus diagnosed 2014 maintained on Imuran as well as Plaquenil followed by Dr. Gavin Pound, interstitial lung disease, CAD with CABG May 6568 chronic systolic congestive heart failure PAF on Eliquis and recent PE followed by cardiology service Dr. Acie Fredrickson.  Per chart review lives with spouse independent prior to admission two-level home bed and bath upstairs.  Presented 04/26/2020 with increasing shortness of breath x2 weeks associated nausea and intermittent vomiting approximately 2-3 times per week.  Shortness of breath worsening with exertion states he could barely walk 10 feet without becoming short of breath and also reports 20 pound unintentional weight loss since May 2021.  Patient recently admitted 4/21 due to acute on chronic systolic congestive heart failure.  In the ED patient was tachypneic creatinine 1.45 from a baseline 1.0-1.2 elevated BNP 1824.  Echocardiogram with ejection fraction of 20 to 12% grade 3 diastolic dysfunction with septal akinesis as well as biventricular dysfunction along with severe mitral regurgitation.  CT of the chest with contrast showed poor opacification of bilateral lower lobe pulmonary arteries.  CT angiogram of the chest showed large right side pleural effusion.  There is also a small left-sided pleural effusion.  Patient did receive IV Lasix as well as placed on intravenous heparin followed by cardiology service as well as initiation of milrinone.  Critical care pulmonary service follow-up Dr. Valeta Harms consulted underwent flexible video fiberoptic bronchoscopy 04/29/2020 with biopsies that were unremarkable.  Follow-up CVTS with noted concern for low cardiac output echocardiogram worsening since recent study and patient underwent Impella insertion 05/03/2020 per Dr. Orvan Seen with removal of Impella left ventricular assistive device  05/16/2020.  Hospital course underwent DCCV 05/10/2020 due to ongoing V. tach converted back to A. fib later went into junctional rhythm amiodarone adjusted TEE completed showing mildly elevated LV, EF 20% mildly dilated RV with moderately decreased systolic function moderate MR.  He did remain in normal sinus rhythm after cardiac adjustments continued on amiodarone as directed.  Acute on chronic anemia latest hemoglobin 8.9 renal function stabilized creatinine 1.04.  Palliative care consulted to establish goals of care.  He was on a regular consistency diet.  He was admitted to inpatient rehab services 05/23/2020 doing well with anticipated discharge 05/31/2020 and on the afternoon of 05/30/2020 patient found asystolic arrest, ROSC with one dose lidocaine and transferred [facility]  On 2H had episode of torsades/p.m. VT preceded by R on T phenomenon in the setting of prolonged QT interval terminated by one shock.  He remained intubated through 06/01/2020.  Noted hospital course aspiration pneumonia placed on vancomycin as well as cefepime.  He continues on milrinone.  His amiodarone is currently been discontinued.  His diet remains regular however he was receiving nasogastric tube feeds for nutritional support.  Cardiology continues to follow closely considering plan for LVAD once patient more condition.  Therapies have been resumed and due to patient's limitations physical mobility he was again admitted to inpatient rehab services for comprehensive therapies  Review of Systems  Constitutional: Positive for weight loss.  HENT: Negative for hearing loss.   Eyes: Negative for blurred vision and double vision.  Respiratory: Positive for cough and shortness of breath.   Cardiovascular: Positive for palpitations and leg swelling.  Gastrointestinal: Positive for constipation. Negative for nausea.  Genitourinary: Positive for urgency. Negative for dysuria, flank pain and hematuria.  Musculoskeletal: Positive for joint  pain  and myalgias.  Skin: Negative for rash.  Neurological: Positive for headaches.  Psychiatric/Behavioral: Positive for depression.       Anxiety  All other systems reviewed and are negative.  Past Medical History:  Diagnosis Date  . Anxiety   . CHF (congestive heart failure) ([place])   . Chronic tension headache   . Colon polyps   . Coronary artery disease 2008/2009   MI with PCI x 2, then PCI x 1 in 2009  . Depression   . Dyslipidemia   . Dysrhythmia    atrial fibrillation  . Hyperlipidemia   . Hypertension   . Lupus [facility])    sees Dr Trudie Reed  . Lupus disease of the lung   . 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 Myocardial infarction ([place]) 2008  . 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 Shortness of breath    Past Surgical History:  Procedure Laterality Date  . BRONCHIAL WASHINGS  04/29/2020   Procedure: BRONCHIAL WASHINGS;  Surgeon: Garner Nash, DO;  Location: [place] ENDOSCOPY;  Service: Pulmonary;;  . CARDIAC CATHETERIZATION    . CLIPPING OF ATRIAL APPENDAGE N/A 10/17/2019   Procedure: Clipping Of Atrial Appendage using AtriCure [id number] 45 MM AtriClip.;  Surgeon: Rexene Alberts, MD;  Location: [place];  Service: Open Heart Surgery;  Laterality: N/A;  . CORONARY ARTERY BYPASS GRAFT N/A 10/17/2019   Procedure: CORONARY ARTERY BYPASS GRAFTING (CABG) using LIMA to LAD; Endoscopic harvest right greater saphenous vein: SVG to RAMUS.;  Surgeon: Rexene Alberts, MD;  Location: [place];  Service: Open Heart Surgery;  Laterality: N/A;  . CORONARY BALLOON ANGIOPLASTY N/A 10/17/2019   Procedure: CORONARY BALLOON ANGIOPLASTY;  Surgeon: Nelva Bush, MD;  Location: [place] CV LAB;  Service: Cardiovascular;  Laterality: N/A;  . coronary stents  2009  . CORONARY/GRAFT ACUTE MI REVASCULARIZATION N/A 10/17/2019   Procedure: Coronary/Graft Acute MI Revascularization;  Surgeon: Nelva Bush, MD;  Location: [place] CV LAB;  Service:  Cardiovascular;  Laterality: N/A;  . CORONARY/GRAFT ANGIOGRAPHY N/A 05/04/2020   Procedure: CORONARY/GRAFT ANGIOGRAPHY;  Surgeon: Larey Dresser, MD;  Location: [place] CV LAB;  Service: Cardiovascular;  Laterality: N/A;  . ENDOVEIN HARVEST OF GREATER SAPHENOUS VEIN Right 10/17/2019   Procedure: [place] Of Greater Saphenous Vein;  Surgeon: Rexene Alberts, MD;  Location: [place];  Service: Open Heart Surgery;  Laterality: Right;  . IABP INSERTION N/A 10/17/2019   Procedure: IABP Insertion;  Surgeon: Nelva Bush, MD;  Location: [place] [place] CV LAB;  Service: Cardiovascular;  Laterality: N/A;  . INCISION AND DRAINAGE ABSCESS N/A 01/29/2020   Procedure: INCISION AND DRAINAGE BILATERAL PERIRECTAL ABSCESS;  Surgeon: Stark Klein, MD;  Location: [place] [place];  Service: General;  Laterality: N/A;  . IR THORACENTESIS ASP PLEURAL SPACE W/IMG GUIDE  10/26/2019  . IR THORACENTESIS ASP PLEURAL SPACE W/IMG GUIDE  04/27/2020  . LEFT HEART CATH AND CORS/GRAFTS ANGIOGRAPHY N/A 05/04/2020   Procedure: LEFT HEART CATH AND CORS/GRAFTS ANGIOGRAPHY;  Surgeon: Larey Dresser, MD;  Location: [place] CV LAB;  Service: Cardiovascular;  Laterality: N/A;  . PLACEMENT OF IMPELLA LEFT VENTRICULAR ASSIST DEVICE Right 05/03/2020   Procedure: PLACEMENT OF IMPELLA 5.5 LEFT VENTRICULAR ASSIST DEVICE VIA RIGHT AXILLARY;  Surgeon: [name], MD;  Location: [facility];  Service: Open Heart Surgery;  Laterality: Right;  . REMOVAL OF IMPELLA LEFT VENTRICULAR ASSIST DEVICE N/A 05/16/2020   Procedure: REMOVAL OF  IMPELLA LEFT VENTRICULAR ASSIST DEVICE;  Surgeon: Ivin Poot, MD;  Location: [place];  Service: Open Heart Surgery;  Laterality: N/A;  . RIGHT/LEFT HEART CATH AND CORONARY ANGIOGRAPHY N/A 10/17/2019   Procedure: RIGHT/LEFT HEART CATH AND CORONARY ANGIOGRAPHY;  Surgeon: Nelva Bush, MD;  Location: [place] [facility];  Service: Cardiovascular;  Laterality: N/A;  . TEE WITHOUT CARDIOVERSION  10/17/2019    Procedure: Transesophageal Echocardiogram (Tee);  Surgeon: Rexene Alberts, MD;  Location[facility];  Service: Open Heart Surgery;;  . TEE WITHOUT CARDIOVERSION N/A 05/03/2020   Procedure: TRANSESOPHAGEAL ECHOCARDIOGRAM (TEE);  Surgeon: [name], MD;  Location: [place];  Service: Open Heart Surgery;  Laterality: N/A;  . TEE WITHOUT CARDIOVERSION N/A 05/06/2020   Procedure: TRANSESOPHAGEAL ECHOCARDIOGRAM (TEE);  Surgeon: Larey Dresser, MD;  Location: [facility];  Service: Cardiovascular;  Laterality: N/A;  bedside  . VIDEO BRONCHOSCOPY N/A 04/29/2020   Procedure: VIDEO BRONCHOSCOPY WITHOUT FLUORO;  Surgeon: Garner Nash, DO;  Location: [place];  Service: Pulmonary;  Laterality: N/A;   Family History  Problem Relation Age of Onset  . Heart disease Mother   . Alcohol abuse Father   . Hyperlipidemia Sister   . Hypertension Sister   . Hyperlipidemia Brother   . Hypertension Brother   . Cancer Brother        ?lung cancer  . Stomach cancer Brother   . Diabetes Paternal Uncle   . Colon cancer Neg Hx   . Esophageal cancer Neg Hx   . Rectal cancer Neg Hx    Social History:  reports that he quit smoking about 27 years ago. His smoking use included cigarettes. He has a 20.00 pack-year smoking history. He has never used smokeless tobacco. He reports that he does not drink alcohol and does not use drugs. Allergies: No Known Allergies Medications Prior to Admission  Medication Sig Dispense Refill  . amiodarone (PACERONE) 200 MG tablet Take 1 tablet (200 mg total) by mouth daily.    [name] apixaban (ELIQUIS) 5 MG TABS tablet Take 1 tablet (5 mg total) by mouth 2 (two) times daily. 60 tablet 0  . aspirin EC 81 MG tablet Take 1 tablet (81 mg total) by mouth daily.    [name] azaTHIOprine (IMURAN) 50 MG tablet Take 50 mg by mouth 2 (two) times daily.  2  . docusate sodium (COLACE) 100 MG capsule Take 1 capsule (100 mg total) by mouth 2 (two) times daily. 10 capsule 0  . ferrous sulfate 325 (65  FE) MG tablet Take 325 mg by mouth daily with breakfast.    . hydroxychloroquine (PLAQUENIL) 200 MG tablet Take 400 mg by mouth at bedtime.     . hydroxypropyl methylcellulose / hypromellose (ISOPTO TEARS / GONIOVISC) 2.5 % ophthalmic solution Place 1 drop into both eyes as needed for dry eyes.    . Multiple Vitamin (MULTIVITAMIN WITH MINERALS) TABS tablet Take 1 tablet by mouth daily.    . Nutritional Supplements (,FEEDING SUPPLEMENT, PROSOURCE PLUS) liquid Take 30 mLs by mouth 2 (two) times daily between meals.    [name] oxyCODONE (OXY IR/ROXICODONE) 5 MG immediate release tablet Take 1 tablet (5 mg total) by mouth every 6 (six) hours as needed for severe pain. 30 tablet 0  . polyethylene glycol (MIRALAX / GLYCOLAX) 17 g packet Take 17 g by mouth daily as needed for moderate constipation.     . polyvinyl alcohol (LIQUIFILM TEARS) 1.4 % ophthalmic solution Place 2 drops into both eyes daily.     [name]  rosuvastatin (CRESTOR) 20 MG tablet Take 20 mg by mouth daily.    [name] spironolactone (ALDACTONE) 25 MG tablet Take 0.5 tablets (12.5 mg total) by mouth daily.    . tamsulosin (FLOMAX) 0.4 MG CAPS capsule Take 0.4 mg by mouth every evening.     . torsemide (DEMADEX) 20 MG tablet Take 1 tablet (20 mg total) by mouth daily.      Drug Regimen Review Drug regimen was reviewed and remains appropriate with no significant issues identified  Home: Home Living Family/patient expects to be discharged to:: Private residence Living Arrangements: Spouse/significant other Available Help at Discharge: Family,Available 24 hours/day Type of Home: House Home Access: Stairs to enter [employer] of Steps: 1 Entrance Stairs-Rails: None Home Layout: Two level,1/2 bath on main level Alternate Level Stairs-Number of Steps: 15 Alternate Level Stairs-Rails: Right Bathroom Shower/Tub: Multimedia programmer: Standard Bathroom Accessibility: Yes Home Equipment: None,Shower seat  Lives With: Spouse    Functional History: Prior Function Level of [place]: Independent  Functional Status:  Mobility: Bed Mobility Overal bed mobility: Needs Assistance Bed Mobility: Sidelying to Sit Rolling: Supervision Sidelying to sit: Min assist Supine to sit: Min assist,+2 for safety/equipment Sit to sidelying: Supervision General bed mobility comments: increased effort, HOB elevated, pt utilized rail and therapist to come to sitting Transfers Overall transfer level: Needs assistance Equipment used: Rolling walker (2 wheeled) Transfers: Sit to/from Stand Sit to Stand: Min assist Stand pivot transfers: +2 safety/equipment,Min assist General transfer comment: minA for stability and safety with standing from EOB as well as for transfer as pt with posterior lean.  Pt needs constant support for stability. Ambulation/Gait Ambulation/Gait assistance: Min guard Gait Distance (Feet): 150 Feet Assistive device: Rolling walker (2 wheeled) Gait Pattern/deviations: Step-through pattern General Gait Details: pt with slowed step-through gait, pt with one lateral loss of balance with fatigue when navigating in tight quarters in room Gait velocity: reduced Gait velocity interpretation: <1.8 ft/sec, indicate of risk for recurrent falls    ADL: ADL Overall ADL's : Needs assistance/impaired Eating/Feeding Details (indicate cue type and reason): Cortrak placed Upper Body Dressing : Moderate assistance,Sitting Lower Body Dressing: Sitting/lateral leans,Minimal assistance Lower Body Dressing Details (indicate cue type and reason): able to complete, but required gaurding and minimal stabilization to remian upright at EOB Toilet Transfer: Minimal Warden/ranger Details (indicate cue type and reason): minA for safety and stability with ambulation;simulated with in room mobility Functional mobility during ADLs: Minimal assistance,Rolling walker,Cueing for safety,Cueing for  sequencing General ADL Comments: pt remains limited by activity tolerance and endurance, and required increased cues for sequencing to date, but A&O x4  Cognition: Cognition Overall Cognitive Status: Impaired/Different from baseline Orientation Level: Oriented X4 Cognition Arousal/Alertness: Awake/alert Behavior During Therapy: WFL for tasks assessed/performed Overall Cognitive Status: Impaired/Different from baseline Area of Impairment: Safety/judgement,Following commands Following Commands: Follows multi-step commands with increased time,Follows one step commands with increased time Safety/Judgement: Decreased awareness of safety,Decreased awareness of deficits General Comments: decreased levels of safety noted, minimally impulsive as pt was observed to flop back in the bed when sitting EOB for less than one minute, required increased time to follow commands and sequence  Physical Exam: Blood pressure (!) 105/56, pulse 64, temperature 98.3 F (36.8 C), temperature source Oral, resp. rate (!) 21, height 6' (1.829 m), weight 60.2 kg, SpO2 99 %. Physical Exam Neurological:     Comments: Patient is alert makes eye contact with examiner oriented x3 and follows commands.  He does complain of chest discomfort after  recent CPR but does not recall full cardiac event.     Results for orders placed or performed during the hospital encounter of 05/31/20 (from the past 48 hour(s))  Glucose, capillary     Status: Abnormal   Collection Time: 06/05/20 12:08 PM  Result Value Ref Range   Glucose-Capillary 108 (H) 70 - 99 mg/dL    Comment: Glucose reference range applies only to samples taken after fasting for at least 8 hours.  Glucose, capillary     Status: None   Collection Time: 06/05/20  4:43 PM  Result Value Ref Range   Glucose-Capillary 91 70 - 99 mg/dL    Comment: Glucose reference range applies only to samples taken after fasting for at least 8 hours.  Glucose, capillary     Status:  Abnormal   Collection Time: 06/05/20  5:34 PM  Result Value Ref Range   Glucose-Capillary 110 (H) 70 - 99 mg/dL    Comment: Glucose reference range applies only to samples taken after fasting for at least 8 hours.  Glucose, capillary     Status: Abnormal   Collection Time: 06/05/20  8:55 PM  Result Value Ref Range   Glucose-Capillary 139 (H) 70 - 99 mg/dL    Comment: Glucose reference range applies only to samples taken after fasting for at least 8 hours.  Glucose, capillary     Status: Abnormal   Collection Time: 06/06/20  4:25 AM  Result Value Ref Range   Glucose-Capillary 114 (H) 70 - 99 mg/dL    Comment: Glucose reference range applies only to samples taken after fasting for at least 8 hours.  Cooxemetry Panel (carboxy, met, total hgb, O2 sat)     Status: Abnormal   Collection Time: 06/06/20  6:04 AM  Result Value Ref Range   Total hemoglobin 9.2 (L) 12.0 - 16.0 g/dL   O2 Saturation 54.2 %   Carboxyhemoglobin 1.9 (H) 0.5 - 1.5 %   Methemoglobin 0.7 0.0 - 1.5 %    Comment: Performed at [facility] [address] [place], [place] [address]  CBC     Status: Abnormal   Collection Time: 06/06/20  6:04 AM  Result Value Ref Range   WBC 9.4 4.0 - 10.5 K/uL   RBC 3.45 (L) 4.22 - 5.81 MIL/uL   Hemoglobin 9.1 (L) 13.0 - 17.0 g/dL   HCT 30.7 (L) 39.0 - 52.0 %   MCV 89.0 80.0 - 100.0 fL   MCH 26.4 26.0 - 34.0 pg   MCHC 29.6 (L) 30.0 - 36.0 g/dL   RDW 21.9 (H) 11.5 - 15.5 %   Platelets 249 150 - 400 K/uL   nRBC 1.0 (H) 0.0 - 0.2 %    Comment: Performed at [facility] [address] [place], [place] [address]  Glucose, capillary     Status: Abnormal   Collection Time: 06/06/20  8:43 AM  Result Value Ref Range   Glucose-Capillary 151 (H) 70 - 99 mg/dL    Comment: Glucose reference range applies only to samples taken after fasting for at least 8 hours.  Procalcitonin - Baseline     Status: None   Collection Time: 06/06/20 10:06 AM  Result Value Ref Range    Procalcitonin 0.14 ng/mL    Comment:        Interpretation: PCT (Procalcitonin) <= 0.5 ng/mL: Systemic infection (sepsis) is not likely. Local bacterial infection is possible. (NOTE)       Sepsis PCT Algorithm  Lower Respiratory Tract                                      Infection PCT Algorithm    ----------------------------     ----------------------------         PCT < 0.25 ng/mL                PCT < 0.10 ng/mL          Strongly encourage             Strongly discourage   discontinuation of antibiotics    initiation of antibiotics    ----------------------------     -----------------------------       PCT 0.25 - 0.50 ng/mL            PCT 0.10 - 0.25 ng/mL               OR       >80% decrease in PCT            Discourage initiation of                                            antibiotics      Encourage discontinuation           of antibiotics    ----------------------------     -----------------------------         PCT >= 0.50 ng/mL              PCT 0.26 - 0.50 ng/mL               AND        <80% decrease in PCT             Encourage initiation of                                             antibiotics       Encourage continuation           of antibiotics    ----------------------------     -----------------------------        PCT >= 0.50 ng/mL                  PCT > 0.50 ng/mL               AND         increase in PCT                  Strongly encourage                                      initiation of antibiotics    Strongly encourage escalation           of antibiotics                                     -----------------------------  PCT <= 0.25 ng/mL                                                 OR                                        > 80% decrease in PCT                                      Discontinue / Do not initiate                                             antibiotics  Performed at [facility], [place] [address] [place], [place] [address]   Triglycerides     Status: None   Collection Time: 06/06/20 10:07 AM  Result Value Ref Range   Triglycerides 29 <150 mg/dL    Comment: Performed at [facility] [address], [place], [place] [address]  Glucose, capillary     Status: Abnormal   Collection Time: 06/06/20 12:40 PM  Result Value Ref Range   Glucose-Capillary 105 (H) 70 - 99 mg/dL    Comment: Glucose reference range applies only to samples taken after fasting for at least 8 hours.  CBC     Status: Abnormal   Collection Time: 06/06/20  4:00 PM  Result Value Ref Range   WBC 8.8 4.0 - 10.5 K/uL   RBC 3.23 (L) 4.22 - 5.81 MIL/uL   Hemoglobin 8.9 (L) 13.0 - 17.0 g/dL   HCT 28.7 (L) 39.0 - 52.0 %   MCV 88.9 80.0 - 100.0 fL   MCH 27.6 26.0 - 34.0 pg   MCHC 31.0 30.0 - 36.0 g/dL   RDW 21.4 (H) 11.5 - 15.5 %   Platelets 246 150 - 400 K/uL   nRBC 1.0 (H) 0.0 - 0.2 %    Comment: Performed at [facility] [address] [place], [place] [address]  Prealbumin     Status: Abnormal   Collection Time: 06/06/20  4:00 PM  Result Value Ref Range   Prealbumin 17.9 (L) 18 - 38 mg/dL    Comment: Performed at [facility] [address] [place], [place] [address]  Hepatitis C antibody     Status: None   Collection Time: 06/06/20  4:00 PM  Result Value Ref Range   HCV Ab NON REACTIVE NON REACTIVE    Comment: (NOTE) Nonreactive HCV antibody screen is consistent with no HCV infections,  unless recent infection is suspected or other evidence exists to indicate HCV infection.  Performed at [facility], [place] [address] [place], [place] [address]   Hepatitis B core antibody, IgM     Status: None   Collection Time: 06/06/20  4:00 PM  Result Value Ref Range   Hep B C IgM NON REACTIVE NON REACTIVE    Comment: Performed at [facility] [address], [place], [place] [address]  Hepatitis B surface antibody,quantitative     Status: Abnormal  Collection  Time: 06/06/20  4:00 PM  Result Value Ref Range   Hepatitis B-Post <3.1 (L) Immunity>9.9 mIU/mL    Comment: (NOTE)  Status of Immunity                     Anti-HBs Level  ------------------                     -------------- Inconsistent with Immunity                   0.0 - 9.9 Consistent with Immunity                          >9.9 Performed At: [facility] [place], [place] [id number] Rush Farmer MD LK:[id number]   Hemoglobin A1c     Status: None   Collection Time: 06/06/20  4:00 PM  Result Value Ref Range   Hgb A1c MFr Bld 5.0 4.8 - 5.6 %    Comment: (NOTE) Pre diabetes:          5.7%-6.4%  Diabetes:              >6.4%  Glycemic control for   <7.0% adults with diabetes    Mean Plasma Glucose 96.8 mg/dL    Comment: Performed at [facility] [address] [place], [place] [address]  PSA     Status: None   Collection Time: 06/06/20  4:00 PM  Result Value Ref Range   Prostatic Specific Antigen 3.34 0.00 - 4.00 ng/mL    Comment: (NOTE) While PSA levels of <=4.0 ng/ml are reported as reference range, some men with levels below 4.0 ng/ml can have prostate cancer and many men with PSA above 4.0 ng/ml do not have prostate cancer.  Other tests such as free PSA, age specific reference ranges, PSA velocity and PSA doubling time may be helpful especially in men less than 76 years old. Performed at [facility], [place] [address] [place], [place] [address]   Antithrombin III     Status: None   Collection Time: 06/06/20  4:00 PM  Result Value Ref Range   AntiThromb III Func 96 75 - 120 %    Comment: Performed at [facility] [address] [place], [place] [address]  Lipid panel     Status: None   Collection Time: 06/06/20  4:00 PM  Result Value Ref Range   Cholesterol 92 0 - 200 mg/dL   Triglycerides 28 <150 mg/dL   HDL 52 >40 mg/dL   Total CHOL/HDL Ratio 1.8 RATIO   VLDL 6 0 - 40 mg/dL   LDL Cholesterol 34 0 - 99 mg/dL     Comment:        Total Cholesterol/HDL:CHD Risk Coronary Heart Disease Risk Table                     Men   Women  1/2 Average Risk   3.4   3.3  Average Risk       5.0   4.4  2 X Average Risk   9.6   7.1  3 X Average Risk  23.4   11.0        Use the calculated Patient Ratio above and the CHD Risk Table to determine the patient's CHD Risk.        ATP III CLASSIFICATION (LDL):  <100  mg/dL   Optimal  100-129  mg/dL   Near or Above                    Optimal  130-159  mg/dL   Borderline  160-189  mg/dL   High  >190     mg/dL   Very High Performed at [facility] [address], [place], [place] [address]   Uric acid     Status: Abnormal   Collection Time: 06/06/20  4:00 PM  Result Value Ref Range   Uric Acid, Serum 2.7 (L) 3.7 - 8.6 mg/dL    Comment: Performed at [facility] [address], [place], [place] [address]  Lactate dehydrogenase     Status: None   Collection Time: 06/06/20  4:00 PM  Result Value Ref Range   LDH 176 98 - 192 U/L    Comment: Performed at [facility], [place] [address] [place], [place] [address]  T4, free     Status: Abnormal   Collection Time: 06/06/20  4:00 PM  Result Value Ref Range   Free T4 1.50 (H) 0.61 - 1.12 ng/dL    Comment: (NOTE) Biotin ingestion may interfere with free T4 tests. If the results are inconsistent with the TSH level, previous test results, or the clinical presentation, then consider biotin interference. If needed, order repeat testing after stopping biotin. Performed at [facility], [place] [address] [place], [place] [address]   Protime-INR     Status: Abnormal   Collection Time: 06/06/20  4:00 PM  Result Value Ref Range   Prothrombin Time 18.5 (H) 11.4 - 15.2 seconds   INR 1.6 (H) 0.8 - 1.2    Comment: (NOTE) INR goal varies based on device and disease states. Performed at [facility], [place] [address], [place], [place]  [address]   APTT     Status: None   Collection Time: 06/06/20   4:00 PM  Result Value Ref Range   aPTT 36 24 - 36 seconds    Comment: Performed at [facility] [address] [place], [place] [address]  Glucose, capillary     Status: Abnormal   Collection Time: 06/06/20  4:32 PM  Result Value Ref Range   Glucose-Capillary 125 (H) 70 - 99 mg/dL    Comment: Glucose reference range applies only to samples taken after fasting for at least 8 hours.  Glucose, capillary     Status: Abnormal   Collection Time: 06/06/20  8:47 PM  Result Value Ref Range   Glucose-Capillary 114 (H) 70 - 99 mg/dL    Comment: Glucose reference range applies only to samples taken after fasting for at least 8 hours.   Comment 1 Notify RN   Glucose, capillary     Status: Abnormal   Collection Time: 06/07/20 12:05 AM  Result Value Ref Range   Glucose-Capillary 121 (H) 70 - 99 mg/dL    Comment: Glucose reference range applies only to samples taken after fasting for at least 8 hours.  Glucose, capillary     Status: Abnormal   Collection Time: 06/07/20  4:12 AM  Result Value Ref Range   Glucose-Capillary 118 (H) 70 - 99 mg/dL    Comment: Glucose reference range applies only to samples taken after fasting for at least 8 hours.   Comment 1 Notify RN   Glucose, capillary     Status: Abnormal   Collection Time: 06/07/20  7:32 AM  Result Value Ref Range   Glucose-Capillary 119 (H) 70 - 99 mg/dL    Comment: Glucose reference range applies only to samples taken after fasting for at least 8 hours.   DG Orthopantogram  Result Date: 06/06/2020 CLINICAL DATA:  Preoperative evaluation of a medical condition to r/o surgical contraindications (TAR required). Best obtainable images. NG tube is visible on exam. EXAM: ORTHOPANTOGRAM/PANORAMIC COMPARISON:  None. FINDINGS: No fracture or bone lesion. No visualized periapical lucency to suggest a root abscess. No definitive dental caries. Soft tissues are unremarkable. IMPRESSION: 1. No fracture or bone lesion. 2. No radiographic evidence root  abscess and no convincing dental carie Electronically Signed   By: Lajean Manes M.D.   On: 06/06/2020 13:32   DG Chest 1 View  Result Date: 06/05/2020 CLINICAL DATA:  Heart failure EXAM: CHEST  1 VIEW COMPARISON:  05/31/2020 FINDINGS: Endotracheal tube removed. NG removed. Feeding tube placed extending into the stomach with the tip not visualized. Right arm PICC tip in the SVC unchanged. Cardiac enlargement. Pulmonary vascular congestion. Bibasilar airspace disease has progressed. This may represent pneumonia or edema. There is a small right pleural effusion. IMPRESSION: Cardiac enlargement with vascular congestion. Bibasilar airspace disease consistent with pneumonia or edema. Electronically Signed   By: Franchot Gallo M.D.   On: 06/05/2020 20:01   CT CHEST W CONTRAST  Result Date: 06/07/2020 CLINICAL DATA:  75 year old male under preoperative study prior to potential LVAD placement. Possible pneumonia noted on chest x-ray. Patient is currently afebrile with normal white blood cell count. EXAM: CT CHEST, ABDOMEN, AND PELVIS WITH CONTRAST TECHNIQUE: Multidetector CT imaging of the chest, abdomen and pelvis was performed following the standard protocol during bolus administration of intravenous contrast. CONTRAST:  190m OMNIPAQUE IOHEXOL 300 MG/ML  SOLN COMPARISON:  CT the abdomen and pelvis 01/28/2020. FINDINGS: CT CHEST FINDINGS Cardiovascular: Heart size is mildly enlarged. There is no significant pericardial fluid, thickening or pericardial calcification. There is aortic atherosclerosis, as well as atherosclerosis of the great vessels of the mediastinum and the coronary arteries, including calcified atherosclerotic plaque in the left main, left anterior descending and left circumflex coronary arteries. Coronary artery stents are noted in the left anterior descending and ramus intermedius coronary arteries. Left atrial appendage ligation clip. Status post median sternotomy for CABG including LIMA to the  LAD. Right upper extremity PICC with tip terminating in the distal superior vena cava. There is no bulky calcified atherosclerotic plaque in the ascending thoracic aorta to prevent outflow cannula placement. Mediastinum/Nodes: No pathologically enlarged mediastinal or hilar lymph nodes. Feeding tube extending into the distal stomach. Esophagus is otherwise unremarkable in appearance. No axillary lymphadenopathy. Soft tissue stranding in the right axillary region where there is some surgical clips and a small amount of low-intermediate attenuation fluid, potentially a postoperative seroma from prior lymph node dissection. Lungs/Pleura: Extensive ground-glass attenuation and peribronchovascular airspace consolidation noted in the right lower lobe. Right lower lobe bronchi appear completely opacified, presumably from mucoid impaction. Multiple pulmonary nodules are noted throughout the lungs bilaterally, largest of which is in the medial aspect of the right lower lobe (axial image 46 of series 3) measuring 2.5 x 2.3 cm. Other notable nodules include a 1.9 x 1.3 cm lesion in the medial aspect of the right middle lobe (axial image 43 of series 3), and a subsolid nodule in the left upper lobe which is predominantly some ground-glass attenuation, internal areas of septal thickening and internal air bronchograms measuring 2.0 x 1.8 cm (axial image 66 of  series 5). Several other smaller lesions are also noted. Trace right pleural effusion lying dependently. Musculoskeletal: There are no aggressive appearing lytic or blastic lesions noted in the visualized portions of the skeleton. CT ABDOMEN PELVIS FINDINGS Hepatobiliary: No suspicious cystic or solid hepatic lesions. No intra or extrahepatic biliary ductal dilatation. Gallbladder is normal in appearance. Pancreas: No pancreatic mass. No pancreatic ductal dilatation. No pancreatic or peripancreatic fluid collections or inflammatory changes. Spleen: Unremarkable.  Adrenals/Urinary Tract: Exophytic lesion in the lower pole of the right kidney measuring 1.5 cm in diameter, compatible with a simple cyst. Left kidney and bilateral adrenal glands are normal in appearance. No hydroureteronephrosis. Urinary bladder is normal in appearance. Stomach/Bowel: Feeding tube terminating in the distal stomach. Stomach is otherwise normal in appearance. No pathologic dilatation of small bowel or colon. Asymmetric thickening of the distal rectum involving the wall from approximately 3:00 to 12:00. Previously noted perirectal fluid collections have resolved. Normal appendix. Vascular/Lymphatic: Aortic atherosclerosis, without evidence of aneurysm or dissection in the abdominal or pelvic vasculature. No lymphadenopathy noted in the abdomen or pelvis. Reproductive: Prostate gland and seminal vesicles are unremarkable in appearance. Other: No significant volume of ascites.  No pneumoperitoneum. Musculoskeletal: There are no aggressive appearing lytic or blastic lesions noted in the visualized portions of the skeleton. IMPRESSION: 1. No imaging findings to suggest significant anatomic impediment to LVAD placement. 2. Multiple large pulmonary nodules in the lungs bilaterally, concerning for probable metastatic disease. The largest of these is in the medial aspect of the right lower lobe measuring 2.5 x 2.3 cm. Additionally, in the right lower lobe there is extensive mucoid impaction filling all the right lower lobe bronchi, and extensive airspace consolidation in the right lower lobe which may simply reflect a pneumonia, however, given the patient's afebrile status without leukocytosis, this could alternatively represent extensive lymphangitic spread of disease. 3. Previously noted perirectal abscess has resolved. However, today's study demonstrates asymmetric mural thickening of the distal rectum. This may simply represent resolving thickening in the setting of prior abscess, however, the  possibility of distal rectal neoplasm is not excluded, and correlation with nonemergent sigmoidoscopy should be considered for further evaluation if clinically appropriate. 4. Aortic atherosclerosis, in addition to left main and 2 vessel coronary artery disease. Status post median sternotomy for CABG including LIMA to the LAD, as well as PTCI, as above. 5. Additional incidental findings, as above. Electronically Signed   By: Vinnie Langton M.D.   On: 06/07/2020 08:25   CT ABDOMEN PELVIS W CONTRAST  Result Date: 06/07/2020 CLINICAL DATA:  75 year old male under preoperative study prior to potential LVAD placement. Possible pneumonia noted on chest x-ray. Patient is currently afebrile with normal white blood cell count. EXAM: CT CHEST, ABDOMEN, AND PELVIS WITH CONTRAST TECHNIQUE: Multidetector CT imaging of the chest, abdomen and pelvis was performed following the standard protocol during bolus administration of intravenous contrast. CONTRAST:  132m OMNIPAQUE IOHEXOL 300 MG/ML  SOLN COMPARISON:  CT the abdomen and pelvis 01/28/2020. FINDINGS: CT CHEST FINDINGS Cardiovascular: Heart size is mildly enlarged. There is no significant pericardial fluid, thickening or pericardial calcification. There is aortic atherosclerosis, as well as atherosclerosis of the great vessels of the mediastinum and the coronary arteries, including calcified atherosclerotic plaque in the left main, left anterior descending and left circumflex coronary arteries. Coronary artery stents are noted in the left anterior descending and ramus intermedius coronary arteries. Left atrial appendage ligation clip. Status post median sternotomy for CABG including LIMA to the LAD. Right upper extremity  PICC with tip terminating in the distal superior vena cava. There is no bulky calcified atherosclerotic plaque in the ascending thoracic aorta to prevent outflow cannula placement. Mediastinum/Nodes: No pathologically enlarged mediastinal or hilar lymph  nodes. Feeding tube extending into the distal stomach. Esophagus is otherwise unremarkable in appearance. No axillary lymphadenopathy. Soft tissue stranding in the right axillary region where there is some surgical clips and a small amount of low-intermediate attenuation fluid, potentially a postoperative seroma from prior lymph node dissection. Lungs/Pleura: Extensive ground-glass attenuation and peribronchovascular airspace consolidation noted in the right lower lobe. Right lower lobe bronchi appear completely opacified, presumably from mucoid impaction. Multiple pulmonary nodules are noted throughout the lungs bilaterally, largest of which is in the medial aspect of the right lower lobe (axial image 46 of series 3) measuring 2.5 x 2.3 cm. Other notable nodules include a 1.9 x 1.3 cm lesion in the medial aspect of the right middle lobe (axial image 43 of series 3), and a subsolid nodule in the left upper lobe which is predominantly some ground-glass attenuation, internal areas of septal thickening and internal air bronchograms measuring 2.0 x 1.8 cm (axial image 66 of series 5). Several other smaller lesions are also noted. Trace right pleural effusion lying dependently. Musculoskeletal: There are no aggressive appearing lytic or blastic lesions noted in the visualized portions of the skeleton. CT ABDOMEN PELVIS FINDINGS Hepatobiliary: No suspicious cystic or solid hepatic lesions. No intra or extrahepatic biliary ductal dilatation. Gallbladder is normal in appearance. Pancreas: No pancreatic mass. No pancreatic ductal dilatation. No pancreatic or peripancreatic fluid collections or inflammatory changes. Spleen: Unremarkable. Adrenals/Urinary Tract: Exophytic lesion in the lower pole of the right kidney measuring 1.5 cm in diameter, compatible with a simple cyst. Left kidney and bilateral adrenal glands are normal in appearance. No hydroureteronephrosis. Urinary bladder is normal in appearance. Stomach/Bowel:  Feeding tube terminating in the distal stomach. Stomach is otherwise normal in appearance. No pathologic dilatation of small bowel or colon. Asymmetric thickening of the distal rectum involving the wall from approximately 3:00 to 12:00. Previously noted perirectal fluid collections have resolved. Normal appendix. Vascular/Lymphatic: Aortic atherosclerosis, without evidence of aneurysm or dissection in the abdominal or pelvic vasculature. No lymphadenopathy noted in the abdomen or pelvis. Reproductive: Prostate gland and seminal vesicles are unremarkable in appearance. Other: No significant volume of ascites.  No pneumoperitoneum. Musculoskeletal: There are no aggressive appearing lytic or blastic lesions noted in the visualized portions of the skeleton. IMPRESSION: 1. No imaging findings to suggest significant anatomic impediment to LVAD placement. 2. Multiple large pulmonary nodules in the lungs bilaterally, concerning for probable metastatic disease. The largest of these is in the medial aspect of the right lower lobe measuring 2.5 x 2.3 cm. Additionally, in the right lower lobe there is extensive mucoid impaction filling all the right lower lobe bronchi, and extensive airspace consolidation in the right lower lobe which may simply reflect a pneumonia, however, given the patient's afebrile status without leukocytosis, this could alternatively represent extensive lymphangitic spread of disease. 3. Previously noted perirectal abscess has resolved. However, today's study demonstrates asymmetric mural thickening of the distal rectum. This may simply represent resolving thickening in the setting of prior abscess, however, the possibility of distal rectal neoplasm is not excluded, and correlation with nonemergent sigmoidoscopy should be considered for further evaluation if clinically appropriate. 4. Aortic atherosclerosis, in addition to left main and 2 vessel coronary artery disease. Status post median sternotomy for  CABG including LIMA to the LAD, as well  as PTCI, as above. 5. Additional incidental findings, as above. Electronically Signed   By: Vinnie Langton M.D.   On: 06/07/2020 08:25       Medical Problem List and Plan: 1.  Debility secondary to acute on chronic systolic congestive heart failure/cardiogenic shock/ischemic cardiomyopathy status post Impella insertion 05/03/2020 removal of Impella assistive device 05/23/7587 complicated by cardiac arrest  -patient may *** shower  -ELOS/Goals: *** 2.  Antithrombotics: -DVT/anticoagulation: Eliquis  -antiplatelet therapy: Aspirin 81 mg daily 3. Pain Management: Tylenol as needed 4. Mood: Provide emotional support  -antipsychotic agents: N/A 5. Neuropsych: This patient is capable of making decisions on his own behalf. 6. Skin/Wound Care: Routine skin checks 7. Fluids/Electrolytes/Nutrition: Routine in and outs with follow-up chemistries 8.  Atrial fibrillation/ischemic cardiomyopathy/chronic systolic congestive heart failure.  Continue Demadex 20 mg daily, Aldactone 25 mg daily, milrinone as directed, Mexitil 150 mg every 12 hours.  He is now off amiodarone due to prolonged QT 9.  SLE.  Plaquenil 400 mg nightly, Imuran 100 mg twice daily 10.  Hyperlipidemia.  Crestor 11.  BPH 12.  Aspiration pneumonia.  Complete course of vancomycin/Maxipime initiated 06/07/2019  ***  Lavon Paganini Leonides Minder, PA-C 06/07/2020

## 2020-06-07 NOTE — Consult Note (Addendum)
NAME:  Cody Arellano, MRN:  [id number], DOB:  Sep 14, 1945, LOS: 7 ADMISSION DATE:  05/31/2020, CONSULTATION DATE:  06/07/2020 REFERRING MD:  Dr. Shirlee Latch, CHIEF COMPLAINT:  Pulmonary Nodules   Brief History:  Cody Arellano is a 75 year old  male with history significant for lupus diagnosed 2014 maintained on Imuran and Plaquenil (followed by Dr. Zenovia Jordan), interstitial lung disease, CAD with CABG May 2021, chronic systolic congestive heart failure PAF on Eliquis and recent PE admitted for SOB on 04/26/2020 and found to have acute exacerbation of HFpEF.   History of Present Illness:  Admitted 11/23/21with worsening HF symptoms andfailure to thrive. Underwent CT chest with poor opacification of bilateral LL pulmonary arteries. Large right effusion, persistent volume overload/HF and increasing pulmonary nodules. Underwent thoracentesis and bronch/biopsy. Echo obtained and EF read as 30-35%(felt closer to 20-25%on MD reviewwith moderate RV dysfunction and severe MR, mod-severe TE). Progressed to cardiogenic shock.PICC placed.InitialCo-ox 41%. Started on milrinone and increased to 0.375. Developed AFL with RVR. Started on IV amio.Ultimately required mechanical support. Implella 5.5 placed on 11/30. Hemodynamics and renal function improved. Impella extracted 12/13. Milrinone restarted post extraction for marginal co-ox. Developed hypotension and losartan and spiro discontinued. Midodrine added to support BP. Other medical problems addressed this admit included RLL PNA treated w/ abx and VT requiring DCCV. Regarding long term options for heart failure, he is not a transplant candidate.Barriers to LVAD include nutritional status/deconditioning. Discharged to CIR on on 05/23/20. On 05/31/2020, Code Blue called after patient found unresponsive. Zoll rhythm showed asystole. Had CPR and given epi with ROSC. Intubated and transferred to [facility]  Past Medical History:  Ischemic cardiomyopathy, CAD status post CABG,  systemic lupus erythematous, interstitial lung disease, paroxysmal atrial fibrillation, bicuspid aortic valve, pulmonary embolism  Significant Hospital Events:  04/26/2020: Admitted to [facility] 05/23/2020: Discharged to CIR 05/31/2020: Code Blue; transferred back to ICU 06/03/2020: Transferred to floor   Consults:  Palliative; PCCM  Procedures:  05/03/2020: Impella placed; bronchoscopy 05/04/2020: Left heart cath  05/09/2020: Synchronized cardioversion  05/16/2020: Impella removed 05/31/2020: Intubated post-code 06/01/2020: Extubated  Significant Diagnostic Tests:   CTA (04/26/2020) >> 1. Again noted is poor opacification of the bilateral lower lobe pulmonary arteries. Differential considerations include underlying pulmonary emboli as before versus suboptimal opacification secondary to contrast bolus timing. 2. Large right-sided pleural effusion. There is a small left-sided pleural effusion. 3. Cardiomegaly with reflux of contrast in the IVC consistent with underlying cardiac dysfunction. 4. Bilateral bronchial wall thickening and mucus plugging consistent with infectious or reactive bronchiolitis. 5. Growing bilateral pulmonary nodules. While these may be infectious or inflammatory in etiology, malignancy is not excluded. Pulmonary medicine consultation is recommended. 6. Small volume ascites in the upper abdomen.  CT Chest/Abdomen/Pelvis W contrast 06/07/2019 >> 1. No imaging findings to suggest significant anatomic impediment to LVAD placement. 2. Multiple large pulmonary nodules in the lungs bilaterally, concerning for probable metastatic disease. The largest of these is in the medial aspect of the right lower lobe measuring 2.5 x 2.3 cm. Additionally, in the right lower lobe there is extensive mucoid impaction filling all the right lower lobe bronchi, and extensive airspace consolidation in the right lower lobe which may simply reflect a pneumonia, however, given the  patient's afebrile status without leukocytosis, this could alternatively represent extensive lymphangitic spread of disease. 3. Previously noted perirectal abscess has resolved. However, today's study demonstrates asymmetric mural thickening of the distal rectum. This may simply represent resolving thickening in the setting of prior abscess, however, the possibility of  distal rectal neoplasm is not excluded, and correlation with nonemergent sigmoidoscopy should be considered for further evaluation if clinically appropriate. 4. Aortic atherosclerosis, in addition to left main and 2 vessel coronary artery disease. Status post median sternotomy for CABG including LIMA to the LAD, as well as PTCI, as above. 5. Additional incidental findings, as above.  Micro Data:  06/01/20 Urine culture >> Less than 10,000 colonies of insignificant growth 06/06/20 Expectorated sputum assessment >> Pending   Antimicrobials:  Cefepime 01/03 >> Vancomycin 01/03 >>  Interim History / Subjective:   Per chart review, patient was in CIR and working well with therapy, with discharge home plan the next day, when he was found unresponsive.  CODE BLUE was called with CPR initiated immediately; ROSC was obtained after 4 minutes with 1 dose of epi and 1 shock.  Cody Arellano was subsequently intubated and transferred to the ICU.  Today, Cody Arellano states that he is feeling tired and a little nauseous at times.  He has noted that since being hospitalized, his appetite has decreased and he has been experiencing intermittent night sweats.  He states that the symptoms did not exist prior to hospitalization though.  Currently, he denies any chest pain, shortness of breath, abdominal pain, difficulty with urination.  He notes a chronic history of constipation that worsened on hospitalization but has improved since starting as needed MiraLAX.  We discussed that reason for consultation is to assess pulmonary nodules noted on admission  CT that have since increased in size.  Cody Arellano denies any previous knowledge of pulmonary nodules prior to this hospitalization.  He notes that he has a 20-pack-year smoking history, but quit approximately 35 years ago.  His brother passed of lung cancer after being diagnosed in his 67s.  He denies any other history of cancer within his family, including colon cancer, prostate cancer.  Objective   Blood pressure (!) 105/58, pulse 61, temperature (!) 97.4 F (36.3 C), temperature source Oral, resp. rate 20, height 6' (1.829 m), weight 60.2 kg, SpO2 99 %. CVP:  [4 mmHg-13 mmHg] 9 mmHg      Intake/Output Summary (Last 24 hours) at 06/07/2020 1308 Last data filed at 06/07/2020 1133 Gross per 24 hour  Intake -  Output 2350 ml  Net -2350 ml   Filed Weights   06/03/20 1715 06/06/20 0635 06/07/20 0426  Weight: 60.4 kg 57.2 kg 60.2 kg    Examination: Physical Exam Vitals and nursing note reviewed.  Constitutional:      General: He is not in acute distress.    Appearance: He is cachectic. He is ill-appearing (Chronically).  HENT:     Head: Normocephalic and atraumatic.     Mouth/Throat:     Mouth: Mucous membranes are moist.     Pharynx: Oropharynx is clear.  Eyes:     Extraocular Movements: Extraocular movements intact.     Pupils: Pupils are equal, round, and reactive to light.  Cardiovascular:     Rate and Rhythm: Normal rate and regular rhythm.     Heart sounds: Murmur (Both systolic and diastolic murmur, heard best at left upper sternal border.) heard.    Pulmonary:     Effort: Pulmonary effort is normal. No respiratory distress.     Breath sounds: Decreased breath sounds (Right base), wheezing (Faint inspiratory wheezing heard bilaterally) and rales (Left lower and right middle lung fields) present. No rhonchi.  Abdominal:     General: Bowel sounds are normal. There is no distension.  Palpations: Abdomen is soft.     Tenderness: There is no abdominal tenderness. There is no  guarding.  Skin:    General: Skin is warm and dry.     Findings: No ecchymosis or petechiae. Rash is not purpuric.     Nails: There is no clubbing.  Neurological:     General: No focal deficit present.     Mental Status: He is alert and oriented to person, place, and time. Mental status is at baseline.  Psychiatric:        Mood and Affect: Mood normal.        Behavior: Behavior normal.    Assessment & Plan:   # Multiple bilateral pulmonary nodules Patient is currently being evaluated for possible LVAD placement, however malignancy must be ruled out first.  CT from 01/03 findings show multiple nodules however 2 biggest nodules are in the right lower lobe and middle lobe predominantly in the medial aspects surrounding the cardiac border; nodules appear solid.  There is additionally two ground-glass nodules noted in the left upper lobe. On CTA from admission (~ 5 weeks ago), there was a right middle lobe nodule that was measured at 9 mm, however the right lower lobe nodule cannot be visualized due to pleural effusion.  Patient also had 2 ground-glass nodules in the left upper lobe similar in size to most recent CT. CTA from October 2021 and June 2021 showed similar nodules as listed above, however all were smaller in size. Overall none of the CTs have any evidence of lymphadenopathy. Thoracentesis on admission with fluid sent for cytology was negative for malignant cells. A bronchoscopy was also pursued with bronchioloalveolar lavage of the left upper lobe and right middle lobe; results did not find any evidence of infection, however were not sent for cytology.   Overall, the growth noted of these nodules in a short period of time is atypical for malignancy, however patient does have risk factors including chronic immunosuppression on azathioprine and Plaquenil, 20-pack-year smoking history. The overlying complication is that patient would require a navigational bronchoscopy with general anesthesia in  order to reach these nodules.  We explained to Mr. Sapp that this would be a very high risk procedure for him with risk of death, as well as the nodules may possibly still not be able to be reached.  Mr. Timbers and his son at bedside understand that choosing to forego the bronchoscopy will eliminate his eligibility for an LVAD though.  We discussed that he could possibly have a repeat CT scan in 2 to 3 months with rediscussion of bronchoscopy at that time, versus possibly pursuing an inpatient PET scan although that will not eliminate the need for bronchoscopy if there is evidence of hypermetabolism.  The idea of hospice was reintroduced as patient has had this discussion with palliative earlier in the hospitalization.  At this time, we have decided to pursue a family meeting tomorrow with wife and son at bedside.  - No further intervention planned at this time; pending goals of care discussion with family tomorrow AM  # Right Lower Lobe Consolidation  Consolidation noted on CT scan concerning for aspiration pneumonia in light of recent cardiac arrest with subsequent intubation.  Patient has been receiving cefepime and vancomycin and is currently on day 2.  No evidence of leukocytosis and patient has remained afebrile during this period.  - Continue Cefepime with plan to complete a 5 day course - Discontinue Vancomycin at this time   # Goals of  Care Family discussion with wife and son at bedside, as well as Dr. Shirlee LatchMcLean and Dr. Everardo AllEllison, planned for the AM of 06/08/2020 to help with decisions regarding aggressiveness of care versus hospice.   Labs   CBC: Recent Labs  Lab 05/31/20 1533 05/31/20 2005 06/04/20 0346 06/05/20 0316 06/06/20 0604 06/06/20 1600 06/07/20 1013  WBC 9.0   < > 9.2 7.9 9.4 8.8 6.7  NEUTROABS 4.4  --   --   --   --   --   --   HGB 10.3*   < > 9.1* 9.3* 9.1* 8.9* 7.4*  HCT 35.6*   < > 30.4* 31.3* 30.7* 28.7* 25.9*  MCV 92.2   < > 88.6 88.9 89.0 88.9 91.2  PLT 332   < > 225  222 249 246 211   < > = values in this interval not displayed.   Basic Metabolic Panel: Recent Labs  Lab 06/01/20 0341 06/01/20 0350 06/01/20 1710 06/02/20 0343 06/02/20 1230 06/02/20 1700 06/03/20 0351 06/04/20 0346 06/05/20 0316 06/07/20 1013  NA 139   < >  --  137 139  --  138 138 139 139  K 4.9   < >  --  4.6 4.2  --  4.5 4.5 4.6 3.4*  CL 100  --   --  99 99  --  97* 99 103 112*  CO2 28  --   --  26 29  --  29 27 26  21*  GLUCOSE 146*  --   --  130* 104*  --  96 94 109* 104*  BUN 37*  --   --  42* 40*  --  45* 40* 38* 27*  CREATININE 1.43*  --   --  1.19 1.19  --  0.99 1.01 0.88 0.67  CALCIUM 9.0  --   --  8.6* 8.5*  --  8.5* 8.6* 8.6* 6.9*  MG 2.2  --  2.4 2.6*  --  2.4 2.3  --  2.3  --   PHOS 4.9*  --  4.4 4.4  --  3.3 3.2  --   --   --    < > = values in this interval not displayed.   GFR: Estimated Creatinine Clearance: 69 mL/min (by C-G formula based on SCr of 0.67 mg/dL). Recent Labs  Lab 05/31/20 1830 05/31/20 2005 05/31/20 2243 06/01/20 0341 06/02/20 0818 06/05/20 0316 06/06/20 0604 06/06/20 1006 06/06/20 1600 06/07/20 1013  PROCALCITON  --   --   --   --   --   --   --  0.14  --   --   WBC  --  12.2*  --  8.6   < > 7.9 9.4  --  8.8 6.7  LATICACIDVEN 3.9* 5.6* 4.6* 2.5*  --   --   --   --   --   --    < > = values in this interval not displayed.   Liver Function Tests: Recent Labs  Lab 05/31/20 1533 06/01/20 0341  AST 115* 99*  ALT 86* 96*  ALKPHOS 72 61  BILITOT 1.1 0.9  PROT 7.0 6.6  ALBUMIN 2.9* 2.7*   ABG    Component Value Date/Time   PHART 7.475 (H) 06/01/2020 0350   PCO2ART 41.9 06/01/2020 0350   PO2ART 317 (H) 06/01/2020 0350   HCO3 30.9 (H) 06/01/2020 0350   TCO2 32 06/01/2020 0350   ACIDBASEDEF 1.0 05/03/2020 1334   O2SAT 53.8 06/07/2020 1035   Coagulation Profile:  Recent Labs  Lab 06/06/20 1600  INR 1.6*  HbA1C: Hgb A1c MFr Bld  Date/Time Value Ref Range Status  06/06/2020 04:00 PM 5.0 4.8 - 5.6 % Final    Comment:     (NOTE) Pre diabetes:          5.7%-6.4%  Diabetes:              >6.4%  Glycemic control for   <7.0% adults with diabetes   10/17/2019 12:05 PM 5.7 (H) 4.8 - 5.6 % Final    Comment:    (NOTE) Pre diabetes:          5.7%-6.4% Diabetes:              >6.4% Glycemic control for   <7.0% adults with diabetes    CBG: Recent Labs  Lab 06/06/20 2047 06/07/20 0005 06/07/20 0412 06/07/20 0732 06/07/20 1137  GLUCAP 114* 121* 118* 119* 144*    Review of Systems:   Negative except as noted above.   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   Surgical History:   Past Surgical History:  Procedure Laterality Date  . BRONCHIAL WASHINGS  04/29/2020   Procedure: BRONCHIAL WASHINGS;  Surgeon: Garner Nash, DO;  Location: [place] [facility];  Service: Pulmonary;;  . CARDIAC CATHETERIZATION    . CLIPPING OF ATRIAL APPENDAGE N/A 10/17/2019   Procedure: Clipping Of Atrial Appendage using AtriCure [id number] 45 MM AtriClip.;  Surgeon: Rexene Alberts, MD;  Location: [place];  Service: Open Heart Surgery;  Laterality: N/A;  . CORONARY ARTERY BYPASS GRAFT N/A 10/17/2019   Procedure: CORONARY ARTERY BYPASS GRAFTING (CABG) using LIMA to LAD; Endoscopic harvest right greater saphenous vein: SVG to RAMUS.;  Surgeon: Rexene Alberts, MD;  Location: [place];  Service: Open Heart Surgery;  Laterality: N/A;  . CORONARY BALLOON ANGIOPLASTY N/A 10/17/2019   Procedure: CORONARY BALLOON ANGIOPLASTY;  Surgeon: Nelva Bush, MD;  Location: [place] [facility];  Service: Cardiovascular;  Laterality: N/A;  . coronary stents  2009  . CORONARY/GRAFT  ACUTE MI REVASCULARIZATION N/A 10/17/2019   Procedure: Coronary/Graft Acute MI Revascularization;  Surgeon: Nelva Bush, MD;  Location: [place] [facility];  Service: Cardiovascular;  Laterality: N/A;  . CORONARY/GRAFT ANGIOGRAPHY N/A 05/04/2020   Procedure: CORONARY/GRAFT ANGIOGRAPHY;  Surgeon: Larey Dresser, MD;  Location: [place] [facility];  Service: Cardiovascular;  Laterality: N/A;  . ENDOVEIN HARVEST OF GREATER SAPHENOUS VEIN Right 10/17/2019   Procedure: [place] Of Greater Saphenous Vein;  Surgeon: Rexene Alberts, MD;  Location: [place];  Service: Open Heart Surgery;  Laterality: Right;  . IABP INSERTION N/A 10/17/2019   Procedure: IABP Insertion;  Surgeon: Nelva Bush, MD;  Location: [place] [facility];  Service: Cardiovascular;  Laterality: N/A;  . INCISION AND DRAINAGE ABSCESS N/A 01/29/2020   Procedure: INCISION AND DRAINAGE BILATERAL PERIRECTAL ABSCESS;  Surgeon: Stark Klein, MD;  Location: [place];  Service: General;  Laterality: N/A;  . IR THORACENTESIS ASP PLEURAL SPACE W/IMG GUIDE  10/26/2019  . IR THORACENTESIS ASP PLEURAL SPACE W/IMG GUIDE  04/27/2020  . LEFT HEART CATH AND CORS/GRAFTS ANGIOGRAPHY N/A 05/04/2020   Procedure: LEFT HEART CATH AND CORS/GRAFTS ANGIOGRAPHY;  Surgeon: Larey Dresser,  MD;  Location: [place] [facility];  Service: Cardiovascular;  Laterality: N/A;  . PLACEMENT OF IMPELLA LEFT VENTRICULAR ASSIST DEVICE Right 05/03/2020   Procedure: PLACEMENT OF IMPELLA 5.5 LEFT VENTRICULAR ASSIST DEVICE VIA RIGHT AXILLARY;  Surgeon: [name], MD;  Location: [place];  Service: Open Heart Surgery;  Laterality: Right;  . REMOVAL OF IMPELLA LEFT VENTRICULAR ASSIST DEVICE N/A 05/16/2020   Procedure: REMOVAL OF IMPELLA LEFT VENTRICULAR ASSIST DEVICE;  Surgeon: Ivin Poot, MD;  Location: [place];  Service: Open Heart Surgery;  Laterality: N/A;  . RIGHT/LEFT HEART CATH AND CORONARY ANGIOGRAPHY N/A 10/17/2019   Procedure: RIGHT/LEFT HEART CATH AND  CORONARY ANGIOGRAPHY;  Surgeon: Nelva Bush, MD;  Location: [place] [facility];  Service: Cardiovascular;  Laterality: N/A;  . TEE WITHOUT CARDIOVERSION  10/17/2019   Procedure: Transesophageal Echocardiogram (Tee);  Surgeon: Rexene Alberts, MD;  Location: [facility];  Service: Open Heart Surgery;;  . TEE WITHOUT CARDIOVERSION N/A 05/03/2020   Procedure: TRANSESOPHAGEAL ECHOCARDIOGRAM (TEE);  Surgeon: [name], MD;  Location: [place];  Service: Open Heart Surgery;  Laterality: N/A;  . TEE WITHOUT CARDIOVERSION N/A 05/06/2020   Procedure: TRANSESOPHAGEAL ECHOCARDIOGRAM (TEE);  Surgeon: Larey Dresser, MD;  Location[facility];  Service: Cardiovascular;  Laterality: N/A;  bedside  . VIDEO BRONCHOSCOPY N/A 04/29/2020   Procedure: VIDEO BRONCHOSCOPY WITHOUT FLUORO;  Surgeon: Garner Nash, DO;  Location: [place];  Service: Pulmonary;  Laterality: N/A;     Social History:   reports that he quit smoking about 27 years ago. His smoking use included cigarettes. He has a 20.00 pack-year smoking history. He has never used smokeless tobacco. He reports that he does not drink alcohol and does not use drugs.   Family History:  His family history includes Alcohol abuse in his father; Cancer in his brother; Diabetes in his paternal uncle; Heart disease in his mother; Hyperlipidemia in his brother and sister; Hypertension in his brother and sister; Stomach cancer in his brother. There is no history of Colon cancer, Esophageal cancer, or Rectal cancer.   Allergies No Known Allergies   Home Medications  Prior to Admission medications   Medication Sig Start Date End Date Taking? Authorizing Provider  amiodarone (PACERONE) 200 MG tablet Take 1 tablet (200 mg total) by mouth daily. 05/23/20  Yes Lyda Jester M, PA-C  apixaban (ELIQUIS) 5 MG TABS tablet Take 1 tablet (5 mg total) by mouth 2 (two) times daily. 04/01/20 05/01/20 Yes Pahwani, Einar Grad, MD  aspirin EC 81 MG tablet Take 1 tablet  (81 mg total) by mouth daily. 08/10/14  Yes Nahser, Wonda Cheng, MD  azaTHIOprine (IMURAN) 50 MG tablet Take 50 mg by mouth 2 (two) times daily. 10/11/14  Yes [provider]  docusate sodium (COLACE) 100 MG capsule Take 1 capsule (100 mg total) by mouth 2 (two) times daily. 05/23/20  Yes Lyda Jester M, PA-C  ferrous sulfate 325 (65 FE) MG tablet Take 325 mg by mouth daily with breakfast.   Yes [provider]  hydroxychloroquine (PLAQUENIL) 200 MG tablet Take 400 mg by mouth at bedtime.    Yes [provider]  hydroxypropyl methylcellulose / hypromellose (ISOPTO TEARS / GONIOVISC) 2.5 % ophthalmic solution Place 1 drop into both eyes as needed for dry eyes.   Yes [provider]  Multiple Vitamin (MULTIVITAMIN WITH MINERALS) TABS tablet Take 1 tablet by mouth daily. 05/24/20  Yes Lyda Jester M, PA-C  Nutritional Supplements (,FEEDING SUPPLEMENT, PROSOURCE PLUS) liquid  Take 30 mLs by mouth 2 (two) times daily between meals. 05/23/20  Yes Lyda Jester M, PA-C  oxyCODONE (OXY IR/ROXICODONE) 5 MG immediate release tablet Take 1 tablet (5 mg total) by mouth every 6 (six) hours as needed for severe pain. 02/02/20  Yes Rai, Ripudeep K, MD  polyethylene glycol (MIRALAX / GLYCOLAX) 17 g packet Take 17 g by mouth daily as needed for moderate constipation.    Yes [provider]  polyvinyl alcohol (LIQUIFILM TEARS) 1.4 % ophthalmic solution Place 2 drops into both eyes daily.    Yes [provider]  rosuvastatin (CRESTOR) 20 MG tablet Take 20 mg by mouth daily.   Yes [provider]  spironolactone (ALDACTONE) 25 MG tablet Take 0.5 tablets (12.5 mg total) by mouth daily. 05/24/20  Yes Lyda Jester M, PA-C  tamsulosin (FLOMAX) 0.4 MG CAPS capsule Take 0.4 mg by mouth every evening.  04/02/17  Yes [provider]  torsemide (DEMADEX) 20 MG tablet Take 1 tablet (20 mg total) by mouth daily. 05/23/20  Yes Consuelo Pandy,  PA-C    Dr. Jose Persia Internal Medicine PGY-2  06/07/2020, 1:08 PM

## 2020-06-07 NOTE — Progress Notes (Signed)
Inpatient Rehab Admissions Coordinator:   Spoke to Tonye Becket, NP.  CIR admit on hold pending results of pulmonary consult for concerning CT chest results.  Will continue to follow.   Estill Dooms, PT, DPT Admissions Coordinator [phone] 06/07/20  10:22 AM

## 2020-06-07 NOTE — Progress Notes (Addendum)
Patient ID: Cody Arellano, male   DOB: 1945-08-17, 75 y.o.   MRN: [id number]     Advanced Heart Failure Rounding Note  PCP-Cardiologist: Mertie Moores, MD   Subjective:    42/35: Asystolic arrest in CIR, ROSC with 1 dose lidocaine and transferred to [place].  On 2H, had episode of torsades/PMVT preceded by R on T phenomenon in setting of prolonged QT interval.  Terminated by 1 shock.  12/29: Extubated.  1/3: Started on vanc + cefepime for possible aspiration pneumonia.   He continues on milrinone 0.25, co-ox pending. CVP 7  Complaining of chest soreness on right. Hungry.   Objective:   Weight Range: 60.2 kg Body mass index is 18 kg/m.   Vital Signs:   Temp:  [98 F (36.7 C)-98.4 F (36.9 C)] 98.3 F (36.8 C) (01/04 0729) Pulse Rate:  [61-65] 64 (01/04 0729) Resp:  [17-22] 21 (01/04 0729) BP: (105-123)/(42-76) 105/56 (01/04 0729) SpO2:  [94 %-99 %] 99 % (01/04 0729) Weight:  [60.2 kg] 60.2 kg (01/04 0426) Last BM Date: 06/06/20  Weight change: Filed Weights   06/03/20 1715 06/06/20 0635 06/07/20 0426  Weight: 60.4 kg 57.2 kg 60.2 kg    Intake/Output:   Intake/Output Summary (Last 24 hours) at 06/07/2020 0810 Last data filed at 06/07/2020 0400 Gross per 24 hour  Intake -  Output 2200 ml  Net -2200 ml      Physical Exam  CVP 7  General:  Thin .  No resp difficulty HEENT: [place] Neck: supple. no JVD. Carotids 2+ bilat; no bruits. No lymphadenopathy or thryomegaly appreciated. Cor: PMI nondisplaced. Regular rate & rhythm. No rubs, gallops or murmurs. Lungs: Decreased in the bases Abdomen: soft, nontender, nondistended. No hepatosplenomegaly. No bruits or masses. Good bowel sounds. Extremities: no cyanosis, clubbing, rash, edema Neuro: alert & orientedx3, cranial nerves grossly intact. moves all 4 extremities w/o difficulty. Affect pleasant    Telemetry  NSR 60s personally reviewed.  Labs    CBC Recent Labs    06/06/20 0604 06/06/20 1600  WBC 9.4 8.8  HGB  9.1* 8.9*  HCT 30.7* 28.7*  MCV 89.0 88.9  PLT 249 361   Basic Metabolic Panel Recent Labs    06/05/20 0316  NA 139  K 4.6  CL 103  CO2 26  GLUCOSE 109*  BUN 38*  CREATININE 0.88  CALCIUM 8.6*  MG 2.3   Liver Function Tests No results for input(s): AST, ALT, ALKPHOS, BILITOT, PROT, ALBUMIN in the last 72 hours. No results for input(s): LIPASE, AMYLASE in the last 72 hours. Cardiac Enzymes No results for input(s): CKTOTAL, CKMB, CKMBINDEX, TROPONINI in the last 72 hours.  BNP: BNP (last 3 results) Recent Labs    04/26/20 1319 04/30/20 0248 05/31/20 1533  BNP 1,824.4* 1,685.5* 1,995.6*    ProBNP (last 3 results) No results for input(s): PROBNP in the last 8760 hours.   D-Dimer No results for input(s): DDIMER in the last 72 hours. Hemoglobin A1C Recent Labs    06/06/20 1600  HGBA1C 5.0   Fasting Lipid Panel Recent Labs    06/06/20 1600  CHOL 92  HDL 52  LDLCALC 34  TRIG 28  CHOLHDL 1.8   Thyroid Function Tests No results for input(s): TSH, T4TOTAL, T3FREE, THYROIDAB in the last 72 hours.  Invalid input(s): FREET3  Other results:   Imaging    DG Orthopantogram  Result Date: 06/06/2020 CLINICAL DATA:  Preoperative evaluation of a medical condition to r/o surgical contraindications (TAR required). Best obtainable  images. NG tube is visible on exam. EXAM: ORTHOPANTOGRAM/PANORAMIC COMPARISON:  None. FINDINGS: No fracture or bone lesion. No visualized periapical lucency to suggest a root abscess. No definitive dental caries. Soft tissues are unremarkable. IMPRESSION: 1. No fracture or bone lesion. 2. No radiographic evidence root abscess and no convincing dental carie Electronically Signed   By: Lajean Manes M.D.   On: 06/06/2020 13:32     Medications:     Scheduled Medications: . apixaban  5 mg Oral BID  . aspirin  81 mg Oral Daily  . azaTHIOprine  100 mg Oral BID  . chlorhexidine gluconate (MEDLINE KIT)  15 mL Mouth Rinse BID  . Chlorhexidine  Gluconate Cloth  6 each Topical Daily  . dapagliflozin propanediol  10 mg Oral Daily  . docusate  100 mg Oral BID  . feeding supplement  237 mL Oral BID BM  . feeding supplement (PROSource TF)  45 mL Per Tube TID  . hydroxychloroquine  400 mg Oral QHS  . mexiletine  150 mg Oral Q12H  . pantoprazole sodium  40 mg Oral Daily  . polyethylene glycol  17 g Oral Daily  . rosuvastatin  20 mg Oral Daily  . sodium chloride flush  3 mL Intravenous Q12H  . spironolactone  25 mg Oral Daily  . torsemide  20 mg Oral Daily    Infusions: . sodium chloride 250 mL (06/04/20 1650)  . ceFEPime (MAXIPIME) IV 2 g (06/06/20 2036)  . feeding supplement (VITAL 1.5 CAL) 1,000 mL (06/07/20 0239)  . milrinone 0.25 mcg/kg/min (06/06/20 2013)  . norepinephrine (LEVOPHED) Adult infusion    . vancomycin 750 mg (06/07/20 0031)    PRN Medications: sodium chloride, acetaminophen, guaiFENesin, ondansetron (ZOFRAN) IV, sodium chloride flush, sodium chloride flush   Assessment/Plan   1. Cardiac arrest: Had VT episode 12/7, required DCCV.  63/87 had asystolic arrest in CIR required 1 dose epinephrine and brief CPR.  Torsades/PMVT later on 12/27 in setting of markedly prolonged QT interval and R on T phenomenon, lidocaine started (no amiodarone with markedly long QT). K and Mg normal.  ECG 12/29 QTc prolonged 506 msec. Echo unchanged, head CT negative, HS-TnI mildly elevated to peak 190 in setting of arrests.  - No VT.  - Off lidocaine gtt and switched to mexiletine 150 mg twice a day, continue.  2. Chronic systolic CHF/cardiogenic shock: Ischemic cardiomyopathy. Echo this admission looks worse than 10/21 with EF 20-25% with septal akinesis, mid to apical inferior akinesis, apical anterior and apex akinesis, moderately dilated and dysfunctional RV, moderate TR, severe MR, bicuspid aortic valve with no stenosis, mild aortic insufficiency. Progressive/end-stage CHF worsened by onset of atrial fibrillation. Cardiogenic shock  11/30 with co-ox down to 30%, Impella 5.5 placed to allow time to stabilize and consider further steps. Impella extracted 12/13. 0.125 milrinone added post explant for marginal co-ox. Remains on  milrinone 0.125. He has end-stage biventricular HF. He is not a transplant candidate. Barriers to LVAD include nutritional status/deconditioning and RV. I think RV would be acceptable. We discussed in [place], was not LVAD candidate at that point with malnutrition/deconditioning. He went to CIR for aggressive PT, had improved nutritional and functional status and was going to go home but had arrest. Repeat echo post-arrest with EF 25%, mildly decreased RV function, moderate MR.   -CVP 7. CO-OX pending.  - Continue milrinone 0.25 mcg.   - Continue torsemide 10 mg daily.    - Continue spironolactone 25 daily.  - Continue Farxiga 10 daily.  -  No digoxin with intermittent junctional rhythm.  - At this point, options appear to be home with hospice care or try to get him strengthened to where he can have LVAD placed.  Have discussed this with his wife.  He now has tube feeds going again and is taking po nutrition as well.  He is working with PT/OT.  Would like to get him back to CIR for additional aggressive PT to see if we can get him to the point where surgery would be feasible.  Will discuss at [facility].  3. CAD: Prior history of MI with LAD and ramus PCI. AdmittedMay 2021with anterior STEMI. LHC with occluded ostial LAD (in-stent), 90% ostial ramus, 60-70% proximal LCx, nondominant RCA. PTCA to LAD and ramus to restore flow, then CABG with LIMA-LAD and SVG-ramus. No chest pain, doubt ACS. Fall in EF from 10/21 to 11/21, but he did not present with ACS. [place] 12/1 showed patient LIMA-LAD, patent native LAD, patent dominant LCx. The SVG-ramus is occluded with severe diffuse in-stent restenosis in proximal ramus. Ramus not intervened upon as this would be a complex intervention and unlikely to markedly improve EF.  HS-TnI mildly elevated with cardiac arrest, suspect due to arrest and not ACS.  He has chest tenderness post-CPR. No ischemic CP.  - Continue statin.  4.  AKI: Mild post-arrest- BMET pending.    5. Right pleural effusion: S/p thoracentesis, transudative (CHF).  6. Atrial fibrillation/flutter with RVR: DCCV on 12/3, went back to NSR 12/7. -Maintaining NSR.  - Off amiodarone with long QTc.  - Continue mexiletine.  - Continue apixaban. 7. Mitral regurgitation: TEE 12/3 with moderate central MR. Doubt MitraClip will help him much with moderate MR and he is likely too advanced.  8. H/o PE: In 10/21. Keep anticoagulated, on apixaban. No bleeding  9. Bicuspid aortic valve: Mild AI, no AS.  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  - Continue Imuran and hydroxychloroquine.  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  12. Deconditioning: Needs mobilization.  Talked to nurse about making sure to get him up and walking.  Hopefully PT can see.  Waiting for CIR consult, he is ready to return to CIR.    13. Suspected Aspiration PNC Started on vanc+ cefepime 06/07/19   CIR  Consulted for possible admit. No beds 1/3. Labs pending.  Length of Stay: Allison, NP  06/07/2020, 8:10 AM  Advanced Heart Failure Team Pager [phone] (M-F; [place])  Please contact [facility] for night-coverage after hours (4p -7a ) and weekends on [email or web address]  Patient seen with NP, agree with the above note.   Stable symptomatically.  CVP 7, remains on milrinone but no co-ox yet.   CTA chest concerning, showed multiple large lung nodules and RLL bronchi with mucoid impaction and RLL consolidation.    Hgb also lower at 7.4  Has been working with PT.   General: NAD, thin Neck: No JVD, no thyromegaly or thyroid nodule.  Lungs: Clear to auscultation bilaterally with normal respiratory effort. CV: Nondisplaced PMI.  Heart regular S1/S2, no S3/S4, no murmur.  No peripheral edema.   Abdomen:  Soft, nontender, no hepatosplenomegaly, no distention.  Skin: Intact without lesions or rashes.  Neurologic: Alert and oriented x 3.  Psych: Normal affect. Extremities: No clubbing or cyanosis.  HEENT: Normal.    Suspect that he aspirated with arrest, now with RLL PNA on CXR and RLL bronchi with mucoid impaction/RLL consolidation on CT chest.  He is afebrile with  normal WBCs. PCT was low yesterday at 0.14.  Bilateral pulmonary nodules on yesterday's CT are concerning for possible metastatic disease.  - Continue ceftriaxone for now. - Will ask pulmonary to see him, needs workup for malignant disease in lungs.     CVP 7, stable and will continue milrinone.   He is anemic with hgb trending down, no overt bleeding.  Will check FOBT and send anemia labs.   Continue tube feeds supplementing po.  Continue aggressive work with PT.  We have been trying to get him ready for LVAD, but need to determine etiology of large lung nodules (he would not be a candidate for LVAD with metastatic cancer).  Pulmonary to see today.  Will not send to CIR yet.    Loralie Champagne 06/07/2020 10:49 AM

## 2020-06-07 NOTE — Consult Note (Signed)
Consultation Note Date: 06/07/2020   Patient Name: Cody Arellano  DOB: 05-27-1946  MRN: [id number]  Age / Sex: 75 y.o., male  PCP: Venia Carbon, MD Referring Physician: Larey Dresser, MD  Reason for Consultation: Establishing goals of care and Psychosocial/spiritual support  HPI/Patient Profile: 75 y.o. male  admitted on 71/69/6789 with a complicated PMHx including SLE, COPD, CAD s/p previous PCIs.   First admittedearlier this year in5/21 with anterior STEMI found to have thrombotic occlusion of ostial LAD stent,  with EF 25-30%.  Underwent PTCA of LAD lesion with reduction 100% ->50% however unable to stent ramus due to protrusion of the previous ramus stents into LM. IABP placed and taken for emergent CABG with LIMA ->LAD and SVG to RI (Dr. Roxy Manns) Echo in 5/21 EF 45-50% (25% at cath). Post-op course c/b PAF and started on amio (which was eventually stopped).   Admitted 11/23/21with worsening HF symptoms andfailure to thrive. Underwent CT chest with poor opacification of bilateral LL pulmonary arteries. Large right effusion, persistent volume overload/HF and increasing pulmonary nodules.   Underwent thoracentesis and bronch/biopsy. Echo obtained and EF read as 30-35%(felt closer to 20-25%on MD reviewwith moderate RV dysfunction and severe MR, mod-severe TE).   Progressed to cardiogenic shock.Ultimately required mechanical support. Patient had Impella placed on 05-03-20, milrinone was restarted. Patient is frail and deconditioned. He was discharged to CIR on 05/23/2020.  He was progressing and set for discharge on 06/01/2020 when he had a cardiac arrest, required intubation.       Extubated on 12-28  Out of ICU     Remains on milrinone.    Palliative medicine reconsulted for goals of care as discussion around LVAD continues.  Consideration for return to CIR are pending results of pulmonary  consult for concerning CT chest results for pulmonary nodules.  Patient and his family face ongoing treatment option decisions, advanced directive decisions and anticipatory care needs.   Clinical Assessment and Goals of Care:   This NP Wadie Lessen reviewed medical records, received report from team, assessed the patient and then meet at the patient's bedside  to discuss diagnosis, prognosis, GOC, EOL wishes disposition and options.   Concept of Palliative Care was introduced as specialized medical care for people and their families living with serious illness.  If focuses on providing relief from the symptoms and stress of a serious illness.  The goal is to improve quality of life for both the patient and the family.  Created space and opportunity for patient  to explore thoughts and feelings regarding current medical information. Patient verbalizes an understanding of the seriousness of his current medical situation. He tells me that he continues "to pray on it" in order to make decisions regarding the next steps in his treatment plan. There continues to be discussion around possibility of LVAD versus home with milrinone and hospice.     A  discussion was had today regarding advanced directives.  Concepts specific to code status, artifical feeding and hydration, continued IV antibiotics and rehospitalization  was had.    The difference between a aggressive medical intervention path  and a palliative comfort care path for this patient at this time was had.     Values and goals of care important to patient and family were attempted to be elicited.   MOST form     Questions and concerns addressed.  Patient  encouraged to call with questions or concerns.     Patient gives me permission to call his wife. I spoke to Mrs. Lisa by telephone and had conversation regarding above topics with her. She to understands the seriousness of the situation and is in agreement for a follow-up meeting to  include her son and daughter on Thursday at 10:30 AM.   PMT will continue to support holistically.          HPOA/wife     SUMMARY OF RECOMMENDATIONS    Code Status/Advance Care Planning:  Full code  Encouraged patient to consider DNR/DNI status understanding evidenced based poor outcomes in similar hospitalized patient, as the cause of arrest is likely associated with advanced chronic illness rather than an easily reversible acute cardio-pulmonary event.   Palliative Prophylaxis:   Aspiration, Bowel Regimen, Delirium Protocol, Frequent Pain Assessment and Oral Care  Additional Recommendations (Limitations, Scope, Preferences):  Full Scope Treatment  Psycho-social/Spiritual:   Desire for further Chaplaincy support:yes  Additional Recommendations: Education on Hospice  Prognosis:   Unable to determine-long-term prognosis is poor secondary to significant heart disease.  Discharge Planning: To Be Determined      Primary Diagnoses: Present on Admission:  Cardiac arrest [facility])   I have reviewed the medical record, interviewed the patient and family, and examined the patient. The following aspects are pertinent.  Past Medical History:  Diagnosis Date   Anxiety    CHF (congestive heart failure) (HCC)    Chronic tension headache    Colon polyps    Coronary artery disease 2008/2009   MI with PCI x 2, then PCI x 1 in 2009   Depression    Dyslipidemia    Dysrhythmia    atrial fibrillation   Hyperlipidemia    Hypertension    Lupus (HCC)    sees Dr Nickola Major   Lupus disease of the lung    Lupus pericarditis (HCC)    Myocardial infarction (HCC) 2008   S/P emergency CABG x 2 10/17/2019   LIMA to LAD, SVG to ramus intermediate, EVH via right thigh   Shortness of breath    Social History   Socioeconomic History   Marital status: Married    Spouse name: Not on file   Number of children: 5   Years of education: Not on file   Highest education  level: Not on file  Occupational History   Occupation: Manufacturing engineer for [employer]    Comment: retired  Tobacco Use   Smoking status: Former Smoker    Packs/day: 1.00    Years: 20.00    Pack years: 20.00    Types: Cigarettes    Quit date: 02/26/1993    Years since quitting: 27.2   Smokeless tobacco: Never Used   Tobacco comment: QUIT SMOKING 20 YEARS AGO  Vaping Use   Vaping Use: Never used  Substance and Sexual Activity   Alcohol use: No   Drug use: No   Sexual activity: Not on file  Other Topics Concern   Not on file  Social History Narrative   No living will   Wife should make health care decisions--alternate  is sons   Would accept resuscitation   Not sure about tube feeds   Social Determinants of Health   Financial Resource Strain: Not on file  Food Insecurity: No Food Insecurity   Worried About Charity fundraiser in the Last Year: Never true   Ran Out of Food in the Last Year: Never true  Transportation Needs: No Transportation Needs   Lack of Transportation (Medical): No   Lack of Transportation (Non-Medical): No  Physical Activity: Insufficiently Active   Days of Exercise per Week: 4 days   Minutes of Exercise per Session: 10 min  Stress: Not on file  Social Connections: Not on file   Family History  Problem Relation Age of Onset   Heart disease Mother    Alcohol abuse Father    Hyperlipidemia Sister    Hypertension Sister    Hyperlipidemia Brother    Hypertension Brother    Cancer Brother        ?lung cancer   Stomach cancer Brother    Diabetes Paternal Uncle    Colon cancer Neg Hx    Esophageal cancer Neg Hx    Rectal cancer Neg Hx    Scheduled Meds:  apixaban  5 mg Oral BID   aspirin  81 mg Oral Daily   azaTHIOprine  100 mg Oral BID   chlorhexidine gluconate (MEDLINE KIT)  15 mL Mouth Rinse BID   Chlorhexidine Gluconate Cloth  6 each Topical Daily   dapagliflozin propanediol  10 mg Oral Daily    docusate  100 mg Oral BID   feeding supplement  237 mL Oral BID BM   feeding supplement (PROSource TF)  45 mL Per Tube TID   hydroxychloroquine  400 mg Oral QHS   mexiletine  150 mg Oral Q12H   pantoprazole sodium  40 mg Oral Daily   polyethylene glycol  17 g Oral Daily   rosuvastatin  20 mg Oral Daily   sodium chloride flush  3 mL Intravenous Q12H   spironolactone  25 mg Oral Daily   torsemide  20 mg Oral Daily   Continuous Infusions:  sodium chloride 10 mL/hr at 06/07/20 1500   ceFEPime (MAXIPIME) IV Stopped (06/07/20 0939)   feeding supplement (VITAL 1.5 CAL) 1,000 mL (06/07/20 0239)   milrinone 0.25 mcg/kg/min (06/07/20 1500)   norepinephrine (LEVOPHED) Adult infusion     vancomycin Stopped (06/07/20 1358)   PRN Meds:.sodium chloride, acetaminophen, guaiFENesin, ondansetron (ZOFRAN) IV, sodium chloride flush, sodium chloride flush Medications Prior to Admission:  Prior to Admission medications   Medication Sig Start Date End Date Taking? Authorizing Provider  amiodarone (PACERONE) 200 MG tablet Take 1 tablet (200 mg total) by mouth daily. 05/23/20  Yes Lyda Jester M, PA-C  apixaban (ELIQUIS) 5 MG TABS tablet Take 1 tablet (5 mg total) by mouth 2 (two) times daily. 04/01/20 05/01/20 Yes Pahwani, Einar Grad, MD  aspirin EC 81 MG tablet Take 1 tablet (81 mg total) by mouth daily. 08/10/14  Yes Nahser, Wonda Cheng, MD  azaTHIOprine (IMURAN) 50 MG tablet Take 50 mg by mouth 2 (two) times daily. 10/11/14  Yes [provider]  docusate sodium (COLACE) 100 MG capsule Take 1 capsule (100 mg total) by mouth 2 (two) times daily. 05/23/20  Yes Lyda Jester M, PA-C  ferrous sulfate 325 (65 FE) MG tablet Take 325 mg by mouth daily with breakfast.   Yes [provider]  hydroxychloroquine (PLAQUENIL) 200 MG tablet Take 400 mg by mouth at bedtime.  Yes [provider]  hydroxypropyl methylcellulose / hypromellose (ISOPTO TEARS / GONIOVISC) 2.5 %  ophthalmic solution Place 1 drop into both eyes as needed for dry eyes.   Yes [provider]  Multiple Vitamin (MULTIVITAMIN WITH MINERALS) TABS tablet Take 1 tablet by mouth daily. 05/24/20  Yes Lyda Jester M, PA-C  Nutritional Supplements (,FEEDING SUPPLEMENT, PROSOURCE PLUS) liquid Take 30 mLs by mouth 2 (two) times daily between meals. 05/23/20  Yes Lyda Jester M, PA-C  oxyCODONE (OXY IR/ROXICODONE) 5 MG immediate release tablet Take 1 tablet (5 mg total) by mouth every 6 (six) hours as needed for severe pain. 02/02/20  Yes Rai, Ripudeep K, MD  polyethylene glycol (MIRALAX / GLYCOLAX) 17 g packet Take 17 g by mouth daily as needed for moderate constipation.    Yes [provider]  polyvinyl alcohol (LIQUIFILM TEARS) 1.4 % ophthalmic solution Place 2 drops into both eyes daily.    Yes [provider]  rosuvastatin (CRESTOR) 20 MG tablet Take 20 mg by mouth daily.   Yes [provider]  spironolactone (ALDACTONE) 25 MG tablet Take 0.5 tablets (12.5 mg total) by mouth daily. 05/24/20  Yes Lyda Jester M, PA-C  tamsulosin (FLOMAX) 0.4 MG CAPS capsule Take 0.4 mg by mouth every evening.  04/02/17  Yes [provider]  torsemide (DEMADEX) 20 MG tablet Take 1 tablet (20 mg total) by mouth daily. 05/23/20  Yes Lyda Jester M, PA-C   No Known Allergies Review of Systems  Neurological: Positive for weakness.    Physical Exam Constitutional:      Appearance: He is cachectic. He is ill-appearing.  Cardiovascular:     Rate and Rhythm: Normal rate.  Pulmonary:     Breath sounds: Decreased breath sounds present.  Skin:    General: Skin is warm and dry.  Neurological:     Mental Status: He is alert.     Vital Signs: BP (!) 105/58 (BP Location: Left Arm)    Pulse 61    Temp (!) 97.4 F (36.3 C) (Oral)    Resp 20    Ht 6' (1.829 m)    Wt 60.2 kg    SpO2 99%    BMI 18.00 kg/m  Pain Scale: 0-10   Pain Score: 0-No  pain   SpO2: SpO2: 99 % O2 Device:SpO2: 99 % O2 Flow Rate: .O2 Flow Rate (L/min): 1 L/min  IO: Intake/output summary:   Intake/Output Summary (Last 24 hours) at 06/07/2020 1547 Last data filed at 06/07/2020 1500 Gross per 24 hour  Intake 1553.09 ml  Output 2350 ml  Net -796.91 ml    LBM: Last BM Date: 06/06/20 Baseline Weight: Weight: 58.4 kg Most recent weight: Weight: 60.2 kg     Palliative Assessment/Data:  30 % at best   Discussed with Darrick Grinder NP  Time In: 5053 Time Out: 1600 Time Total: 75 minutes Greater than 50%  of this time was spent counseling and coordinating care related to the above assessment and plan.  Signed by: Wadie Lessen, NP   Please contact Palliative Medicine Team phone at [phone] for questions and concerns.  For individual provider: See Shea Evans

## 2020-06-08 ENCOUNTER — Other Ambulatory Visit (HOSPITAL_COMMUNITY): Payer: No Typology Code available for payment source

## 2020-06-08 ENCOUNTER — Encounter (HOSPITAL_COMMUNITY): Payer: No Typology Code available for payment source

## 2020-06-08 DIAGNOSIS — R627 Adult failure to thrive: Secondary | ICD-10-CM

## 2020-06-08 LAB — BASIC METABOLIC PANEL
Anion gap: 9 (ref 5–15)
BUN: 35 mg/dL — ABNORMAL HIGH (ref 8–23)
CO2: 24 mmol/L (ref 22–32)
Calcium: 8.1 mg/dL — ABNORMAL LOW (ref 8.9–10.3)
Chloride: 102 mmol/L (ref 98–111)
Creatinine, Ser: 0.8 mg/dL (ref 0.61–1.24)
GFR, Estimated: >60 mL/min (ref >60)
Glucose, Bld: 161 mg/dL — ABNORMAL HIGH (ref 70–99)
Potassium: 4.4 mmol/L (ref 3.5–5.1)
Sodium: 135 mmol/L (ref 135–145)

## 2020-06-08 LAB — GLUCOSE, CAPILLARY
Glucose-Capillary: 107 mg/dL — ABNORMAL HIGH (ref 70–99)
Glucose-Capillary: 108 mg/dL — ABNORMAL HIGH (ref 70–99)
Glucose-Capillary: 117 mg/dL — ABNORMAL HIGH (ref 70–99)
Glucose-Capillary: 122 mg/dL — ABNORMAL HIGH (ref 70–99)
Glucose-Capillary: 122 mg/dL — ABNORMAL HIGH (ref 70–99)
Glucose-Capillary: 124 mg/dL — ABNORMAL HIGH (ref 70–99)
Glucose-Capillary: 94 mg/dL (ref 70–99)

## 2020-06-08 LAB — COOXEMETRY PANEL
Carboxyhemoglobin: 1.5 % (ref 0.5–1.5)
Carboxyhemoglobin: 1.9 % — ABNORMAL HIGH (ref 0.5–1.5)
Methemoglobin: 0.7 % (ref 0.0–1.5)
Methemoglobin: 0.6 % (ref 0.0–1.5)
O2 Saturation: 36.2 %
O2 Saturation: 57.2 %
Total hemoglobin: 9 g/dL — ABNORMAL LOW (ref 12.0–16.0)
Total hemoglobin: 8.8 g/dL — ABNORMAL LOW (ref 12.0–16.0)

## 2020-06-08 LAB — LUPUS ANTICOAGULANT PANEL
DRVVT: 64.8 s — ABNORMAL HIGH (ref 0.0–47.0)
PTT Lupus Anticoagulant: 37.2 s (ref 0.0–51.9)

## 2020-06-08 LAB — DRVVT CONFIRM: dRVVT Confirm: 0.8 ratio (ref 0.8–1.2)

## 2020-06-08 LAB — CBC
HCT: 29.6 % — ABNORMAL LOW (ref 39.0–52.0)
Hemoglobin: 8.6 g/dL — ABNORMAL LOW (ref 13.0–17.0)
MCH: 26.4 pg (ref 26.0–34.0)
MCHC: 29.1 g/dL — ABNORMAL LOW (ref 30.0–36.0)
MCV: 90.8 fL (ref 80.0–100.0)
Platelets: 241 10*3/uL (ref 150–400)
RBC: 3.26 MIL/uL — ABNORMAL LOW (ref 4.22–5.81)
RDW: 21.2 % — ABNORMAL HIGH (ref 11.5–15.5)
WBC: 7.5 10*3/uL (ref 4.0–10.5)
nRBC: 1.6 % — ABNORMAL HIGH (ref 0.0–0.2)

## 2020-06-08 LAB — DRVVT MIX: dRVVT Mix: 54.5 s — ABNORMAL HIGH (ref 0.0–40.4)

## 2020-06-08 MED ORDER — SODIUM CHLORIDE 0.9 % IV SOLN
510.0000 mg | Freq: Once | INTRAVENOUS | Status: AC
  Administered 2020-06-08: 510 mg via INTRAVENOUS
  Filled 2020-06-08: qty 17

## 2020-06-08 NOTE — Progress Notes (Signed)
PT Cancellation Note  Patient Details Name: Cody Arellano MRN: [id number] DOB: [birth year]   Cancelled Treatment:    Reason Eval/Treat Not Completed: (P) Medical issues which prohibited therapy (Pt with poor prognosis and palliative is involved.  He is refusing therapy at this time as "today is not a good day."  Will continue to follow.)  If therapy is no longer appropriate for this pt please d/c order.    Carlyne Keehan Artis Delay 06/08/2020, 5:21 PM  Bonney Leitz , PTA Acute Rehabilitation Services Pager [phone] Office [phone]

## 2020-06-08 NOTE — Progress Notes (Signed)
Offered ambulation. Pt talking with his family, declines. Sts he is not sure if he will want to continue mobility in the future, definitely not today. I encouraged him to let his RN know if he changes his mind today. [id number] Ethelda Chick CES, ACSM 11:21 AM 06/08/2020

## 2020-06-08 NOTE — Progress Notes (Signed)
NAME:  Cody Arellano, MRN:  [id number], DOB:  [birth year], LOS: 7 ADMISSION DATE:  05/31/2020, CONSULTATION DATE:  06/07/2020 REFERRING MD:  Dr. Aundra Dubin, CHIEF COMPLAINT:  Pulmonary Nodules   Brief History:  Cody Arellano is a 75 year old  male with history significant for lupus diagnosed 2014 maintained on Imuran and Plaquenil (followed by Dr. Gavin Pound), interstitial lung disease, CAD with CABG May [id number], chronic systolic congestive heart failure PAF on Eliquis and recent PE admitted for SOB on 04/26/2020 and found to have acute exacerbation of HFpEF.   History of Present Illness:  Admitted 11/23/21with worsening HF symptoms andfailure to thrive. Underwent CT chest with poor opacification of bilateral LL pulmonary arteries. Large right effusion, persistent volume overload/HF and increasing pulmonary nodules. Underwent thoracentesis and bronch/biopsy. Echo obtained and EF read as 30-35%(felt closer to 20-25%on MD reviewwith moderate RV dysfunction and severe MR, mod-severe TE). Progressed to cardiogenic shock.PICC placed.InitialCo-ox 41%. Started on milrinone and increased to 0.375. Developed AFL with RVR. Started on IV amio.Ultimately required mechanical support. Implella 5.5 placed on 11/30. Hemodynamics and renal function improved. Impella extracted 12/13. Milrinone restarted post extraction for marginal co-ox. Developed hypotension and losartan and spiro discontinued. Midodrine added to support BP. Other medical problems addressed this admit included RLL PNA treated w/ abx and VT requiring DCCV. Regarding long term options for heart failure, he is not a transplant candidate.Barriers to LVAD include nutritional status/deconditioning. Discharged to CIR on on 05/23/20. On 05/31/2020, Code Blue called after patient found unresponsive. Zoll rhythm showed asystole. Had CPR and given epi with ROSC. Intubated and transferred to [facility].  Past Medical History:  Ischemic cardiomyopathy, CAD status post CABG,  systemic lupus erythematous, interstitial lung disease, paroxysmal atrial fibrillation, bicuspid aortic valve, pulmonary embolism  Significant Hospital Events:  04/26/2020: Admitted to [facility] 05/23/2020: Discharged to [facility] 05/31/2020: Code Blue; transferred back to ICU 06/03/2020: Transferred to floor   Consults:  Palliative; PCCM  Procedures:  05/03/2020: Impella placed; bronchoscopy 05/04/2020: Left heart cath  05/09/2020: Synchronized cardioversion  05/16/2020: Impella removed 05/31/2020: Intubated post-code 06/01/2020: Extubated  Significant Diagnostic Tests:  CTA (04/26/2020) >> 1. Again noted is poor opacification of the bilateral lower lobe pulmonary arteries. Differential considerations include underlying pulmonary emboli as before versus suboptimal opacification secondary to contrast bolus timing. 2. Large right-sided pleural effusion. There is a small left-sided pleural effusion. 3. Cardiomegaly with reflux of contrast in the IVC consistent with underlying cardiac dysfunction. 4. Bilateral bronchial wall thickening and mucus plugging consistent with infectious or reactive bronchiolitis. 5. Growing bilateral pulmonary nodules. While these may be infectious or inflammatory in etiology, malignancy is not excluded. Pulmonary medicine consultation is recommended. 6. Small volume ascites in the upper abdomen.  CT Chest/Abdomen/Pelvis W contrast 06/07/2019 >> 1. No imaging findings to suggest significant anatomic impediment to LVAD placement. 2. Multiple large pulmonary nodules in the lungs bilaterally, concerning for probable metastatic disease. The largest of these is in the medial aspect of the right lower lobe measuring 2.5 x 2.3 cm. Additionally, in the right lower lobe there is extensive mucoid impaction filling all the right lower lobe bronchi, and extensive airspace consolidation in the right lower lobe which may simply reflect a pneumonia, however, given the  patient's afebrile status without leukocytosis, this could alternatively represent extensive lymphangitic spread of disease. 3. Previously noted perirectal abscess has resolved. However, today's study demonstrates asymmetric mural thickening of the distal rectum. This may simply represent resolving thickening in the setting of prior abscess, however, the possibility of distal  rectal neoplasm is not excluded, and correlation with nonemergent sigmoidoscopy should be considered for further evaluation if clinically appropriate. 4. Aortic atherosclerosis, in addition to left main and 2 vessel coronary artery disease. Status post median sternotomy for CABG including LIMA to the LAD, as well as PTCI, as above. 5. Additional incidental findings, as above.  Micro Data:  06/01/20 Urine culture >> Less than 10,000 colonies of insignificant growth 06/06/20 Expectorated sputum assessment >> Pending   Antimicrobials:  Cefepime 01/03 >> Vancomycin 01/03 >>   Interim History / Subjective:   No acute complaints at this time. Mr. Cody Arellano, his wife and his son at bedside are anxious to start the family meeting though.   Objective   Blood pressure 106/66, pulse 60, temperature 98.2 F (36.8 C), temperature source Oral, resp. rate 20, height 6' (1.829 m), weight 63.2 kg, SpO2 100 %. CVP:  [7 mmHg-13 mmHg] 9 mmHg      Intake/Output Summary (Last 24 hours) at 06/08/2020 0844 Last data filed at 06/08/2020 0448 Gross per 24 hour  Intake 1553.09 ml  Output 2500 ml  Net -946.91 ml   Filed Weights   06/06/20 0635 06/07/20 0426 06/08/20 0446  Weight: 57.2 kg 60.2 kg 63.2 kg   Examination: Physical Exam Vitals and nursing note reviewed.  Constitutional:      Appearance: He is cachectic.  Cardiovascular:     Rate and Rhythm: Normal rate and regular rhythm.     Heart sounds: Murmur heard.    Pulmonary:     Effort: Pulmonary effort is normal. No respiratory distress.     Breath sounds: Rales  (bibasilar) present.  Musculoskeletal:     Right lower leg: No edema.     Left lower leg: No edema.  Skin:    General: Skin is warm and dry.  Neurological:     General: No focal deficit present.     Mental Status: He is alert and oriented to person, place, and time. Mental status is at baseline.  Psychiatric:        Mood and Affect: Mood normal.        Behavior: Behavior normal.    Assessment & Plan:   # Multiple bilateral pulmonary nodules Discussed again with family that navigational bronchoscopy is the best option to reach nodules for biopsy, but reaching the nodules is not guaranteed and the procedure is extremely high risk for Mr. Hoiland. Please see goals of care discussion below.   - Continue to hold off on further interventions pending family decision on hospice vs aggressive care.   # Right Lower Lobe Consolidation  Consolidation noted on CT scan concerning for aspiration pneumonia in light of recent cardiac arrest with subsequent intubation.   - Continue Cefepime with plan to complete a 7 day course   Best practice (evaluated daily)  Per primary team.   Goals of Care:  Last date of multidisciplinary goals of care discussion: 06/08/2020 AM Family and staff present: Wife and son, Dr. Aundra Dubin, Dr. Loanne Drilling, Wadie Lessen, Levada Dy RN  Summary of discussion: Discussion this morning regarding next steps in care.  Family was explained that patient is still a long way off from being eligible for the LVAD, however eligibility is reliant on a complete rule out of malignancy.  We discussed the high risk involved with bronchoscopy at this time given patient will need to be intubated under general anesthesia, in addition, the nodules position makes bronchoscopy not a guaranteed success.  Hospice was discussed as another option to  allow family and Mr. Musleh to go home.  Family would like some time to discuss further and will let us know their decision. Code Status: Full   Labs   CBC: Recent  Labs  Lab 06/04/20 0346 06/05/20 0316 06/06/20 0604 06/06/20 1600 06/07/20 1013  WBC 9.2 7.9 9.4 8.8 6.7  HGB 9.1* 9.3* 9.1* 8.9* 7.4*  HCT 30.4* 31.3* 30.7* 28.7* 25.9*  MCV 88.6 88.9 89.0 88.9 91.2  PLT 225 222 249 246 [id number]    Basic Metabolic Panel: Recent Labs  Lab 06/01/20 1710 06/02/20 0343 06/02/20 0343 06/02/20 1230 06/02/20 1700 06/03/20 0351 06/04/20 0346 06/05/20 0316 06/07/20 1013  NA  --  137   < > 139  --  138 138 139 139  K  --  4.6   < > 4.2  --  4.5 4.5 4.6 3.4*  CL  --  99   < > 99  --  97* 99 103 112*  CO2  --  26   < > 29  --  29 27 26  21*  GLUCOSE  --  130*   < > 104*  --  96 94 109* 104*  BUN  --  42*   < > 40*  --  45* 40* 38* 27*  CREATININE  --  1.19   < > 1.19  --  0.99 1.01 0.88 0.67  CALCIUM  --  8.6*   < > 8.5*  --  8.5* 8.6* 8.6* 6.9*  MG 2.4 2.6*  --   --  2.4 2.3  --  2.3  --   PHOS 4.4 4.4  --   --  3.3 3.2  --   --   --    < > = values in this interval not displayed.   GFR: Estimated Creatinine Clearance: 72.4 mL/min (by C-G formula based on SCr of 0.67 mg/dL). Recent Labs  Lab 06/05/20 0316 06/06/20 0604 06/06/20 1006 06/06/20 1600 06/07/20 1013  PROCALCITON  --   --  0.14  --   --   WBC 7.9 9.4  --  8.8 6.7    Liver Function Tests: No results for input(s): AST, ALT, ALKPHOS, BILITOT, PROT, ALBUMIN in the last 168 hours. No results for input(s): LIPASE, AMYLASE in the last 168 hours. No results for input(s): AMMONIA in the last 168 hours.  ABG    Component Value Date/Time   PHART 7.475 (H) 06/01/2020 0350   PCO2ART 41.9 06/01/2020 0350   PO2ART 317 (H) 06/01/2020 0350   HCO3 30.9 (H) 06/01/2020 0350   TCO2 32 06/01/2020 0350   ACIDBASEDEF 1.0 05/03/2020 1334   O2SAT 36.2 06/08/2020 0545     Coagulation Profile: Recent Labs  Lab 06/06/20 1600  INR 1.6*    Cardiac Enzymes: No results for input(s): CKTOTAL, CKMB, CKMBINDEX, TROPONINI in the last 168 hours.  HbA1C: Hgb A1c MFr Bld  Date/Time Value Ref Range  Status  06/06/2020 04:00 PM 5.0 4.8 - 5.6 % Final    Comment:    (NOTE) Pre diabetes:          5.7%-6.4%  Diabetes:              >6.4%  Glycemic control for   <7.0% adults with diabetes   10/17/2019 12:05 PM 5.7 (H) 4.8 - 5.6 % Final    Comment:    (NOTE) Pre diabetes:          5.7%-6.4% Diabetes:              >  6.4% Glycemic control for   <7.0% adults with diabetes     CBG: Recent Labs  Lab 06/07/20 1137 06/07/20 1622 06/07/20 2009 06/08/20 0029 06/08/20 0452  GLUCAP 144* 114* 110* 122* 108*    Review of Systems:   Negative except as noted above.   Past Medical History:  He,  has a past medical history of Anxiety, CHF (congestive heart failure) (HCC), Chronic tension headache, Colon polyps, Coronary artery disease (2008/2009), Depression, Dyslipidemia, Dysrhythmia, Hyperlipidemia, Hypertension, Lupus (HCC), Lupus disease of the lung, Lupus pericarditis (HCC), Myocardial infarction (HCC) (2008), S/P emergency CABG x 2 (10/17/2019), and Shortness of breath.   Surgical History:   Past Surgical History:  Procedure Laterality Date  . BRONCHIAL WASHINGS  04/29/2020   Procedure: BRONCHIAL WASHINGS;  Surgeon: Josephine Igo, DO;  Location: [facility] ENDOSCOPY;  Service: Pulmonary;;  . CARDIAC CATHETERIZATION    . CLIPPING OF ATRIAL APPENDAGE N/A 10/17/2019   Procedure: Clipping Of Atrial Appendage using AtriCure [id number] 45 MM AtriClip.;  Surgeon: Purcell Nails, MD;  Location: [facility] OR;  Service: Open Heart Surgery;  Laterality: N/A;  . CORONARY ARTERY BYPASS GRAFT N/A 10/17/2019   Procedure: CORONARY ARTERY BYPASS GRAFTING (CABG) using LIMA to LAD; Endoscopic harvest right greater saphenous vein: SVG to RAMUS.;  Surgeon: Purcell Nails, MD;  Location: [facility] OR;  Service: Open Heart Surgery;  Laterality: N/A;  . CORONARY BALLOON ANGIOPLASTY N/A 10/17/2019   Procedure: CORONARY BALLOON ANGIOPLASTY;  Surgeon: Yvonne Kendall, MD;  Location: [facility] INVASIVE CV LAB;  Service: Cardiovascular;   Laterality: N/A;  . coronary stents  2009  . CORONARY/GRAFT ACUTE MI REVASCULARIZATION N/A 10/17/2019   Procedure: Coronary/Graft Acute MI Revascularization;  Surgeon: Yvonne Kendall, MD;  Location: [facility] INVASIVE CV LAB;  Service: Cardiovascular;  Laterality: N/A;  . CORONARY/GRAFT ANGIOGRAPHY N/A 05/04/2020   Procedure: CORONARY/GRAFT ANGIOGRAPHY;  Surgeon: Laurey Morale, MD;  Location: [facility] INVASIVE CV LAB;  Service: Cardiovascular;  Laterality: N/A;  . ENDOVEIN HARVEST OF GREATER SAPHENOUS VEIN Right 10/17/2019   Procedure: Mack Guise Of Greater Saphenous Vein;  Surgeon: Purcell Nails, MD;  Location: [facility] OR;  Service: Open Heart Surgery;  Laterality: Right;  . IABP INSERTION N/A 10/17/2019   Procedure: IABP Insertion;  Surgeon: Yvonne Kendall, MD;  Location: [facility] INVASIVE CV LAB;  Service: Cardiovascular;  Laterality: N/A;  . INCISION AND DRAINAGE ABSCESS N/A 01/29/2020   Procedure: INCISION AND DRAINAGE BILATERAL PERIRECTAL ABSCESS;  Surgeon: Almond Lint, MD;  Location: [facility] OR;  Service: General;  Laterality: N/A;  . IR THORACENTESIS ASP PLEURAL SPACE W/IMG GUIDE  10/26/2019  . IR THORACENTESIS ASP PLEURAL SPACE W/IMG GUIDE  04/27/2020  . LEFT HEART CATH AND CORS/GRAFTS ANGIOGRAPHY N/A 05/04/2020   Procedure: LEFT HEART CATH AND CORS/GRAFTS ANGIOGRAPHY;  Surgeon: Laurey Morale, MD;  Location: [facility] INVASIVE CV LAB;  Service: Cardiovascular;  Laterality: N/A;  . PLACEMENT OF IMPELLA LEFT VENTRICULAR ASSIST DEVICE Right 05/03/2020   Procedure: PLACEMENT OF IMPELLA 5.5 LEFT VENTRICULAR ASSIST DEVICE VIA RIGHT AXILLARY;  Surgeon: Linden Dolin, MD;  Location: [facility] OR;  Service: Open Heart Surgery;  Laterality: Right;  . REMOVAL OF IMPELLA LEFT VENTRICULAR ASSIST DEVICE N/A 05/16/2020   Procedure: REMOVAL OF IMPELLA LEFT VENTRICULAR ASSIST DEVICE;  Surgeon: Kerin Perna, MD;  Location: [facility] OR;  Service: Open Heart Surgery;  Laterality: N/A;  . RIGHT/LEFT HEART CATH AND CORONARY ANGIOGRAPHY  N/A 10/17/2019   Procedure: RIGHT/LEFT HEART CATH AND CORONARY ANGIOGRAPHY;  Surgeon: Yvonne Kendall, MD;  Location: [facility]  INVASIVE CV LAB;  Service: Cardiovascular;  Laterality: N/A;  . TEE WITHOUT CARDIOVERSION  10/17/2019   Procedure: Transesophageal Echocardiogram (Tee);  Surgeon: Rexene Alberts, MD;  Location: [facility] OR;  Service: Open Heart Surgery;;  . TEE WITHOUT CARDIOVERSION N/A 05/03/2020   Procedure: TRANSESOPHAGEAL ECHOCARDIOGRAM (TEE);  Surgeon: [name], MD;  Location: [place];  Service: Open Heart Surgery;  Laterality: N/A;  . TEE WITHOUT CARDIOVERSION N/A 05/06/2020   Procedure: TRANSESOPHAGEAL ECHOCARDIOGRAM (TEE);  Surgeon: Larey Dresser, MD;  Location: [facility] ENDOSCOPY;  Service: Cardiovascular;  Laterality: N/A;  bedside  . VIDEO BRONCHOSCOPY N/A 04/29/2020   Procedure: VIDEO BRONCHOSCOPY WITHOUT FLUORO;  Surgeon: Garner Nash, DO;  Location: [place];  Service: Pulmonary;  Laterality: N/A;     Social History:   reports that he quit smoking about 27 years ago. His smoking use included cigarettes. He has a 20.00 pack-year smoking history. He has never used smokeless tobacco. He reports that he does not drink alcohol and does not use drugs.   Family History:  His family history includes Alcohol abuse in his father; Cancer in his brother; Diabetes in his paternal uncle; Heart disease in his mother; Hyperlipidemia in his brother and sister; Hypertension in his brother and sister; Stomach cancer in his brother. There is no history of Colon cancer, Esophageal cancer, or Rectal cancer.   Allergies No Known Allergies   Home Medications  Prior to Admission medications   Medication Sig Start Date End Date Taking? Authorizing Provider  amiodarone (PACERONE) 200 MG tablet Take 1 tablet (200 mg total) by mouth daily. 05/23/20  Yes Lyda Jester M, PA-C  apixaban (ELIQUIS) 5 MG TABS tablet Take 1 tablet (5 mg total) by mouth 2 (two) times daily. 04/01/20 05/01/20 Yes  Pahwani, Einar Grad, MD  aspirin EC 81 MG tablet Take 1 tablet (81 mg total) by mouth daily. 08/10/14  Yes Nahser, Wonda Cheng, MD  azaTHIOprine (IMURAN) 50 MG tablet Take 50 mg by mouth 2 (two) times daily. 10/11/14  Yes [provider]  docusate sodium (COLACE) 100 MG capsule Take 1 capsule (100 mg total) by mouth 2 (two) times daily. 05/23/20  Yes Lyda Jester M, PA-C  ferrous sulfate 325 (65 FE) MG tablet Take 325 mg by mouth daily with breakfast.   Yes [provider]  hydroxychloroquine (PLAQUENIL) 200 MG tablet Take 400 mg by mouth at bedtime.    Yes [provider]  hydroxypropyl methylcellulose / hypromellose (ISOPTO TEARS / GONIOVISC) 2.5 % ophthalmic solution Place 1 drop into both eyes as needed for dry eyes.   Yes [provider]  Multiple Vitamin (MULTIVITAMIN WITH MINERALS) TABS tablet Take 1 tablet by mouth daily. 05/24/20  Yes Lyda Jester M, PA-C  Nutritional Supplements (,FEEDING SUPPLEMENT, PROSOURCE PLUS) liquid Take 30 mLs by mouth 2 (two) times daily between meals. 05/23/20  Yes Lyda Jester M, PA-C  oxyCODONE (OXY IR/ROXICODONE) 5 MG immediate release tablet Take 1 tablet (5 mg total) by mouth every 6 (six) hours as needed for severe pain. 02/02/20  Yes Rai, Ripudeep K, MD  polyethylene glycol (MIRALAX / GLYCOLAX) 17 g packet Take 17 g by mouth daily as needed for moderate constipation.    Yes [provider]  polyvinyl alcohol (LIQUIFILM TEARS) 1.4 % ophthalmic solution Place 2 drops into both eyes daily.    Yes [provider]  rosuvastatin (CRESTOR) 20 MG tablet Take 20 mg by mouth daily.   Yes [provider]  spironolactone (ALDACTONE) 25  MG tablet Take 0.5 tablets (12.5 mg total) by mouth daily. 05/24/20  Yes Lyda Jester M, PA-C  tamsulosin (FLOMAX) 0.4 MG CAPS capsule Take 0.4 mg by mouth every evening.  04/02/17  Yes [provider]  torsemide (DEMADEX) 20 MG tablet Take 1 tablet (20 mg  total) by mouth daily. 05/23/20  Yes Consuelo Pandy, PA-C     Dr. Jose Persia Internal Medicine PGY-2  06/08/2020, 8:45 AM

## 2020-06-08 NOTE — Progress Notes (Signed)
Patient ID: KEY CEN, male   DOB: November 22, 1945, 75 y.o.   MRN: [id number]     Advanced Heart Failure Rounding Note  PCP-Cardiologist: Mertie Moores, MD   Subjective:    93/23: Asystolic arrest in CIR, ROSC with 1 dose lidocaine and transferred to [place].  On 2H, had episode of torsades/PMVT preceded by R on T phenomenon in setting of prolonged QT interval.  Terminated by 1 shock.  12/29: Extubated.  1/3: Started on vanc + cefepime for possible aspiration pneumonia.  1/4: CT chest with RLL bronchi with mucoid impaction/RLL consolidation. Bilateral pulmonary nodules are concerning for possible metastatic disease.   He continues on milrinone 0.25, co-ox ?36% this morning, repeat pending.  No major complaints, fatigues with walks.   Objective:   Weight Range: 63.2 kg Body mass index is 18.9 kg/m.   Vital Signs:   Temp:  [97.4 F (36.3 C)-98.3 F (36.8 C)] 97.5 F (36.4 C) (01/05 0854) Pulse Rate:  [60-67] 67 (01/05 0854) Resp:  [18-21] 21 (01/05 0854) BP: (97-118)/(56-72) 118/72 (01/05 0854) SpO2:  [98 %-100 %] 99 % (01/05 0854) Weight:  [63.2 kg] 63.2 kg (01/05 0446) Last BM Date: 06/06/20  Weight change: Filed Weights   06/06/20 0635 06/07/20 0426 06/08/20 0446  Weight: 57.2 kg 60.2 kg 63.2 kg    Intake/Output:   Intake/Output Summary (Last 24 hours) at 06/08/2020 0955 Last data filed at 06/08/2020 0448 Gross per 24 hour  Intake 1553.09 ml  Output 2500 ml  Net -946.91 ml      Physical Exam  General: NAD Neck: No JVD, no thyromegaly or thyroid nodule.  Lungs: Clear to auscultation bilaterally with normal respiratory effort. CV: Lateral PMI.  Heart regular S1/S2, no S3/S4, 2/6 HSM apex.  No peripheral edema.   Abdomen: Soft, nontender, no hepatosplenomegaly, no distention.  Skin: Intact without lesions or rashes.  Neurologic: Alert and oriented x 3.  Psych: Normal affect. Extremities: No clubbing or cyanosis.  HEENT: Normal.    Telemetry  NSR 60s personally  reviewed.  Labs    CBC Recent Labs    06/06/20 1600 06/07/20 1013  WBC 8.8 6.7  HGB 8.9* 7.4*  HCT 28.7* 25.9*  MCV 88.9 91.2  PLT 246 557   Basic Metabolic Panel Recent Labs    06/07/20 1013  NA 139  K 3.4*  CL 112*  CO2 21*  GLUCOSE 104*  BUN 27*  CREATININE 0.67  CALCIUM 6.9*   Liver Function Tests No results for input(s): AST, ALT, ALKPHOS, BILITOT, PROT, ALBUMIN in the last 72 hours. No results for input(s): LIPASE, AMYLASE in the last 72 hours. Cardiac Enzymes No results for input(s): CKTOTAL, CKMB, CKMBINDEX, TROPONINI in the last 72 hours.  BNP: BNP (last 3 results) Recent Labs    04/26/20 1319 04/30/20 0248 05/31/20 1533  BNP 1,824.4* 1,685.5* 1,995.6*    ProBNP (last 3 results) No results for input(s): PROBNP in the last 8760 hours.   D-Dimer No results for input(s): DDIMER in the last 72 hours. Hemoglobin A1C Recent Labs    06/06/20 1600  HGBA1C 5.0   Fasting Lipid Panel Recent Labs    06/06/20 1600  CHOL 92  HDL 52  LDLCALC 34  TRIG 28  CHOLHDL 1.8   Thyroid Function Tests No results for input(s): TSH, T4TOTAL, T3FREE, THYROIDAB in the last 72 hours.  Invalid input(s): FREET3  Other results:   Imaging    No results found.   Medications:     Scheduled Medications: .  apixaban  5 mg Oral BID  . aspirin  81 mg Oral Daily  . azaTHIOprine  100 mg Oral BID  . chlorhexidine gluconate (MEDLINE KIT)  15 mL Mouth Rinse BID  . Chlorhexidine Gluconate Cloth  6 each Topical Daily  . dapagliflozin propanediol  10 mg Oral Daily  . docusate  100 mg Oral BID  . feeding supplement  237 mL Oral TID BM  . feeding supplement (PROSource TF)  45 mL Per Tube QID  . hydroxychloroquine  400 mg Oral QHS  . mexiletine  150 mg Oral Q12H  . pantoprazole sodium  40 mg Oral Daily  . polyethylene glycol  17 g Oral Daily  . rosuvastatin  20 mg Oral Daily  . sodium chloride flush  3 mL Intravenous Q12H  . spironolactone  25 mg Oral Daily  .  torsemide  20 mg Oral Daily    Infusions: . sodium chloride 10 mL/hr at 06/07/20 1500  . ceFEPime (MAXIPIME) IV 2 g (06/07/20 2032)  . feeding supplement (OSMOLITE 1.5 CAL) 1,000 mL (06/07/20 2023)  . ferumoxytol    . milrinone 0.25 mcg/kg/min (06/07/20 1500)  . norepinephrine (LEVOPHED) Adult infusion      PRN Medications: sodium chloride, acetaminophen, guaiFENesin, ondansetron (ZOFRAN) IV, sodium chloride flush, sodium chloride flush   Assessment/Plan   1. Cardiac arrest: Had VT episode 12/7, required DCCV.  12/27 had asystolic arrest in CIR required 1 dose epinephrine and brief CPR.  Torsades/PMVT later on 12/27 in setting of markedly prolonged QT interval and R on T phenomenon, lidocaine started (no amiodarone with markedly long QT). K and Mg normal.  ECG 12/29 QTc prolonged 506 msec. Echo unchanged, head CT negative, HS-TnI mildly elevated to peak 190 in setting of arrests.  - No VT.  - Off lidocaine gtt and switched to mexiletine 150 mg twice a day, continue.  2. Chronic systolic CHF/cardiogenic shock: Ischemic cardiomyopathy. Echo this admission looks worse than 10/21 with EF 20-25% with septal akinesis, mid to apical inferior akinesis, apical anterior and apex akinesis, moderately dilated and dysfunctional RV, moderate TR, severe MR, bicuspid aortic valve with no stenosis, mild aortic insufficiency. Progressive/end-stage CHF worsened by onset of atrial fibrillation. Cardiogenic shock 11/30 with co-ox down to 30%, Impella 5.5 placed to allow time to stabilize and consider further steps. Impella extracted 12/13. 0.125 milrinone added post explant for marginal co-ox. Remains on  milrinone 0.125. He has end-stage biventricular HF. He is not a transplant candidate. Barriers to LVAD include nutritional status/deconditioning and RV. I think RV would be acceptable. We discussed in MRB, was not LVAD candidate at that point with malnutrition/deconditioning. He went to CIR for aggressive PT,  had improved nutritional and functional status and was going to go home but had arrest. Repeat echo post-arrest with EF 25%, mildly decreased RV function, moderate MR.  Co-ox low early am but has been good the last few mornings. Volume status looks ok. - Repeat co-ox.   - Continue milrinone 0.25 mcg.   - Continue torsemide 10 mg daily.    - Continue spironolactone 25 daily.  - Continue Farxiga 10 daily.  - No digoxin with intermittent junctional rhythm.  - LVAD workup deferred at this point given finding of possible malignant nodules on chest CT.   3. CAD: Prior history of MI with LAD and ramus PCI. AdmittedMay 2021with anterior STEMI. LHC with occluded ostial LAD (in-stent), 90% ostial ramus, 60-70% proximal LCx, nondominant RCA. PTCA to LAD and ramus to restore flow, then  CABG with LIMA-LAD and SVG-ramus. No chest pain, doubt ACS. Fall in EF from 10/21 to 11/21, but he did not present with ACS. [place] 12/1 showed patient LIMA-LAD, patent native LAD, patent dominant LCx. The SVG-ramus is occluded with severe diffuse in-stent restenosis in proximal ramus. Ramus not intervened upon as this would be a complex intervention and unlikely to markedly improve EF. HS-TnI mildly elevated with cardiac arrest, suspect due to arrest and not ACS.  He has chest tenderness post-CPR. No ischemic CP.  - Continue statin.  4.  AKI: Mild post-arrest. BMET pending.    5. Right pleural effusion: S/p thoracentesis, transudative (CHF).  6. Atrial fibrillation/flutter with RVR: DCCV on 12/3, went back to NSR 12/7.  Maintaining NSR.  - Off amiodarone with long QTc.  - Continue mexiletine.  - Continue apixaban. 7. Mitral regurgitation: TEE 12/3 with moderate central MR. Doubt MitraClip will help him much with moderate MR and he is likely too advanced.  8. H/o PE: In 10/21. Keep anticoagulated, on apixaban. No bleeding  9. Bicuspid aortic valve: Mild AI, no AS.  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  - Continue Imuran and  hydroxychloroquine.  11. Pulmonary nodules: Concern for malignancy/metastatic disease.  Pulmonary now following. Will need to make decision between aggressive evaluation and hospice care.  12. Deconditioning: Continue PT.    13. Suspected Aspiration PNA: Started on vanc+ cefepime 06/07/19.  Procalcitonin was not elevated (0.14).  14. Anemia: Fe deficiency.  Pending CBC today and FOBT.  - Will give feraheme.   Family meeting today with myself, pulmonary (Dr. Loanne Drilling) and palliative care Wadie Lessen). Could proceed with aggressive evaluation of lung nodules but would be high risk and require general anesthesia.  Nodules are in a difficult location to biopsy.  Unfortunately, probably the best option in this situation is going to be home with palliative milrinone and hospice.  I am not sure he would survive an attempt at lung biopsy, and malignancy would rule out LVAD.  He and his family will discuss options and let me know whether they want to press forwards with biopsy or aim for a comfort care approach to try to get him home. Even if nodules are infectious and not malignant, he faces a long road to get to LVAD (would need aggressive ongoing PT to mobilize him).   Loralie Champagne 06/08/2020 9:55 AM

## 2020-06-08 NOTE — Progress Notes (Signed)
Patient ID: Cody Arellano, male   DOB: [birth year], 75 y.o.   MRN: [id number]  This NP visited patient at the bedside as a follow up to  yesterday's GOCs meeting, for palliative medicine needs and emotional support.  Present in room for conversation with Dr Shirlee Latch, Dr Lorane Gell and myself  Wife and son are at bedside.  Patient and family received some difficulty here news today regarding viable options exploring pulmonary nodules and possibility of LVAD therapy.  I stayed behind with patient and family for continued conversation regarding diagnosis, prognosis, goals of care, end-of-life wishes, disposition and options.   Education offered on the difference between aggressive medical intervention path and a palliative comfort path for this patient at this time in this situation.  Education offered on home hospice benefit.  Plan is to meet tomorrow morning 10:30 with patient's family at bedside to further clarify goals of care.  Discussed with patient the importance of continued conversation with his family and the  medical providers regarding overall plan of care and treatment options,  ensuring decisions are within the context of the patients values and GOCs.  Questions and concerns addressed     Total time spent on the unit was 35 minutes  Greater than 50% of the time was spent in counseling and coordination of care  Cody Creed NP  Palliative Medicine Team Team Phone # [phone] Pager [phone]

## 2020-06-09 LAB — CBC
HCT: 29.3 % — ABNORMAL LOW (ref 39.0–52.0)
Hemoglobin: 8.8 g/dL — ABNORMAL LOW (ref 13.0–17.0)
MCH: 27.2 pg (ref 26.0–34.0)
MCHC: 30 g/dL (ref 30.0–36.0)
MCV: 90.7 fL (ref 80.0–100.0)
Platelets: 278 10*3/uL (ref 150–400)
RBC: 3.23 MIL/uL — ABNORMAL LOW (ref 4.22–5.81)
RDW: 21.1 % — ABNORMAL HIGH (ref 11.5–15.5)
WBC: 7.5 10*3/uL (ref 4.0–10.5)
nRBC: 5.1 % — ABNORMAL HIGH (ref 0.0–0.2)

## 2020-06-09 LAB — BASIC METABOLIC PANEL
Anion gap: 10 (ref 5–15)
BUN: 38 mg/dL — ABNORMAL HIGH (ref 8–23)
CO2: 25 mmol/L (ref 22–32)
Calcium: 8 mg/dL — ABNORMAL LOW (ref 8.9–10.3)
Chloride: 103 mmol/L (ref 98–111)
Creatinine, Ser: 0.75 mg/dL (ref 0.61–1.24)
GFR, Estimated: >60 mL/min (ref >60)
Glucose, Bld: 135 mg/dL — ABNORMAL HIGH (ref 70–99)
Potassium: 4.5 mmol/L (ref 3.5–5.1)
Sodium: 138 mmol/L (ref 135–145)

## 2020-06-09 LAB — GLUCOSE, CAPILLARY
Glucose-Capillary: 106 mg/dL — ABNORMAL HIGH (ref 70–99)
Glucose-Capillary: 114 mg/dL — ABNORMAL HIGH (ref 70–99)
Glucose-Capillary: 115 mg/dL — ABNORMAL HIGH (ref 70–99)
Glucose-Capillary: 127 mg/dL — ABNORMAL HIGH (ref 70–99)
Glucose-Capillary: 94 mg/dL (ref 70–99)
Glucose-Capillary: 98 mg/dL (ref 70–99)

## 2020-06-09 LAB — PATHOLOGIST SMEAR REVIEW

## 2020-06-09 LAB — COOXEMETRY PANEL
Carboxyhemoglobin: 1.7 % — ABNORMAL HIGH (ref 0.5–1.5)
Methemoglobin: 1.1 % (ref 0.0–1.5)
O2 Saturation: 41.5 %
Total hemoglobin: 9 g/dL — ABNORMAL LOW (ref 12.0–16.0)

## 2020-06-09 LAB — ACID FAST CULTURE WITH REFLEXED SENSITIVITIES (MYCOBACTERIA): Acid Fast Culture: NEGATIVE

## 2020-06-09 LAB — FACTOR 5 LEIDEN

## 2020-06-09 NOTE — TOC Initial Note (Addendum)
Transition of Care [facility]) - Initial/Assessment Note    Patient Details  Name: Cody Arellano MRN: [id number] Date of Birth: [birth year]  Transition of Care [facility]) CM/SW Contact:    Zenon Mayo, RN Phone Number: 06/09/2020, 11:58 AM  Clinical Narrative:                 NCM received referral for home hospice with milrinone.  NCM spoke with patient, he would like [facility], NCM made referral to Hayden with [facility] for home hospice with milrinone,  NCM also notified Carolynn Sayers for milrinone medication.  Patient states his wife or daughter will transport him  Home tomorrow.  Time with [facility] states they are working on getting hospital bed, air pressure mattress, w/chair and bsc, over the bedside table. Patient states now he will need ambulance transport , this NCM confirmed home address.  Expected Discharge Plan: Home w Hospice Care Barriers to Discharge: Continued Medical Work up   Patient Goals and CMS Choice Patient states their goals for this hospitalization and ongoing recovery are:: home with hospice CMS Medicare.gov Compare Post Acute Care list provided to:: Patient Choice offered to / list presented to : Patient  Expected Discharge Plan and Services Expected Discharge Plan: [place]   Discharge Planning Services: CM Consult Post Acute Care Choice: Hospice Living arrangements for the past 2 months: Single Family Home                 DME Arranged: N/A DME Agency: NA       HH Arranged: RN [place] Agency: [place] ([place]) Date [place]: 06/09/20 Time [place]: 1157 Representative spoke with at [place]: Tim  Prior Living Arrangements/Services Living arrangements for the past 2 months: [place] Lives with:: Spouse Patient language and need for interpreter reviewed:: Yes Do you feel safe going back to the place where you live?: Yes      Need for Family Participation in Patient Care: Yes (Comment) Care  giver support system in place?: Yes (comment)   Criminal Activity/Legal Involvement Pertinent to Current Situation/Hospitalization: No - Comment as needed  Activities of Daily Living      Permission Sought/Granted                  Emotional Assessment Appearance:: Appears stated age Attitude/Demeanor/Rapport: Engaged Affect (typically observed): Appropriate Orientation: : Oriented to Self,Oriented to Place,Oriented to  Time,Oriented to Situation Alcohol / Substance Use: Not Applicable Psych Involvement: No (comment)  Admission diagnosis:  Cardiac arrest [facility]) [I46.9] Patient Active Problem List   Diagnosis Date Noted  . Severe malnutrition ([place])   . PAF (paroxysmal atrial fibrillation) ([place])   . Cardiac arrest ([place])   . Slow transit constipation   . Benign prostatic hyperplasia   . Sacral pain   . Pressure injury of skin 05/24/2020  . Palliative care by specialist   . Debility 05/23/2020  . Cardiogenic shock ([place])   . LVAD (left ventricular assist device) present ([place])   . Acute on chronic congestive heart failure ([place])   . Pleural effusion on right 04/26/2020  . Shortness of breath 04/26/2020  . Elevated brain natriuretic peptide (BNP) level 04/26/2020  . AKI (acute kidney injury) ([place]) 04/26/2020  . Pulmonary nodules 04/26/2020  . Nausea & vomiting 04/26/2020  . Valvular heart disease 04/08/2020  . Malnutrition of mild degree ([place]) 04/08/2020  . Acute on chronic systolic CHF (congestive heart failure) ([place]) 04/07/2020  . Pulmonary  embolism (HCC) 03/23/2020  . Chronic systolic CHF (congestive heart failure) (HCC) 03/21/2020  . Fatigue 03/09/2020  . Atherosclerotic heart disease of native coronary artery with angina pectoris (HCC) 03/09/2020  . Protein-calorie malnutrition, severe (HCC) 03/09/2020  . Hypotension 01/29/2020  . Anemia 01/29/2020  . Prostatic hypertrophy 01/29/2020  . Pancreatic lesion 01/29/2020  . Rectal abscess 01/28/2020  . S/P emergency  CABG x 2 10/17/2019  . Paroxysmal atrial fibrillation (HCC) 01/20/2019  . Chronic tension headache   . Goals of care, counseling/discussion 01/09/2018  . Memory loss 01/09/2018  . Advance directive discussed with patient 01/09/2018  . Hyperlipemia 01/17/2016  . 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 Personal history of colonic polyps 05/20/2013  . Episodic mood disorder (HCC) 05/13/2013  . Systemic lupus erythematosus (HCC) 05/13/2013  . Colon polyps   . CAD (coronary artery disease) 04/09/2013  . 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 ILD (interstitial lung disease) (HCC) 02/04/2013   PCP:  Karie Schwalbe, MD Pharmacy:   [facility] [phone] - [address], [place] - [address] [place] [address] Phone: [phone] Fax: [phone]  [facility] - [place], [place] - [phone] [place] [address] Phone: [phone] Fax: [phone]  [facility] [place], [place] - [address] [place] [address] Phone: [phone] Fax: [phone]     Social Determinants of Health (SDOH) Interventions    Readmission Risk Interventions Readmission Risk Prevention Plan 06/09/2020 05/20/2020 03/25/2020  Transportation Screening Complete Complete Complete  PCP or Specialist Appt within 3-5 Days - - Complete  HRI or Home Care Consult - - Complete  Social Work Consult for Recovery Care Planning/Counseling - - Complete  Palliative Care Screening - - Not Applicable  Medication Review Oceanographer) Complete Complete Complete  PCP or Specialist appointment within 3-5 days of discharge Complete - -  HRI or Home Care Consult Complete Complete -  SW Recovery Care/Counseling Consult Complete Complete -  Palliative Care Screening Complete Complete -  Skilled Nursing Facility  Not Applicable Not Applicable -  Some recent data might be hidden

## 2020-06-09 NOTE — Progress Notes (Signed)
Inpatient Rehab Admissions Coordinator:   Note pt transitioning home with hospice care.  CIR will sign off at this time.   Estill Dooms, PT, DPT Admissions Coordinator [phone] 06/09/20  1:43 PM

## 2020-06-09 NOTE — Progress Notes (Signed)
Patient ID: Cody Arellano, male   DOB: November 17, 1945, 75 y.o.   MRN: [id number]  This NP visited patient at the bedside as a follow up for palliative medicine needs and emotional support.  Met at bedside with family as scheduled for continued conversation regarding treatment option decisions, advanced directive decisions, goals of care, end-of-life wishes, disposition and options.  Patient wife and daughter at bedside.   Medical review of current situation, questions and concerns answered to the best my ability.  Patient understands that he is not a candidate for LVAD at this time.  Education offered on the difference between aggressive medical intervention path and a palliative comfort path for this patient at this time in this situation.  Patient verbalizes his decision to discharge home with hospice, continuing on milrinone drip.  Education offered on home hospice benefit.  Plan of care -DNR/DNI -Transition home with hospice on milrinone drip.  Will trigger hospice referral  Prognosis is likely weeks to month -Patient will need transportation home -Continue with current medical interventions until discharge.  At time of discharge      -DC core track, no further artificial feeding into the future      -Convert IV antibiotics to oral if indicated -Once home under hospice services, continue conversation regarding                 continuation of plethora of medications -avoid rehospitalization -MOST form completed    Discussed with patient the importance of continued conversation with his family and the  medical providers regarding overall plan of care and treatment options,  ensuring decisions are within the context of the patients values and GOCs.  Questions and concerns addressed   discussed with Dr. Aundra Dubin via secure chat and case manager RN Neoma Laming  Total time spent on the unit was 60 minutes  Greater than 50% of the time was spent in counseling and coordination of care  Wadie Lessen  NP  Palliative Medicine Team Team Phone # [phone] Pager [phone]

## 2020-06-09 NOTE — Progress Notes (Signed)
Nutrition Brief Note  Noted palliative meeting this am. Plan to d/c home with hospice tomorrow.   Discussed feeding plan with MD. Molli Knock to pull Cortrak and d/c tube feedings. Will continue Ensure for comfort as patient has been drinking them.   Vanessa Kick RD, LDN Clinical Nutrition Pager listed in AMION

## 2020-06-09 NOTE — Progress Notes (Signed)
NG cortrex removed per request of the dietitian and the patient. No complications. Patient states 'it feels better'. Will continue to monitor.  -Estella Husk, RN

## 2020-06-09 NOTE — Progress Notes (Signed)
Pt wife wanted ambulation offered however pt declined, wants to stay in bed. Will not continue to follow as pt is for d/c tomorrow with hospice. [phone] Ethelda Chick CES, ACSM 3:07 PM 06/09/2020

## 2020-06-09 NOTE — TOC Transition Note (Addendum)
Transition of Care [facility]) - CM/SW Discharge Note   Patient Details  Name: Cody Arellano MRN: [id number] Date of Birth: January 29, 1946  Transition of Care [facility]) CM/SW Contact:  Leone Haven, RN Phone Number: 06/09/2020, 2:10 PM   Clinical Narrative:    NCM received referral for home hospice with milrinone.  NCM spoke with patient, he would like [facility], NCM made referral to Tim with [facility] for home hospice with milrinone,  NCM also notified Jeri Modena for milrinone medication.  Patient states his wife or daughter will transport him  Home tomorrow.  Tim with [facility] states they are working on getting hospital bed, air pressure mattress, w/chair and bsc, over  bedside table. Patient states now he will need ambulance transport , this NCM confirmed home address.  16:13- Tim with [facility] states he will check to see what time the DME will be delivered to patient's home and he will let this NCM know.  Jeri Modena states she will have the meds ready by 10 am tomorrow. Awaiting to hear back from Tim with [facility] for eta for DME.   Final next level of care: Home w Hospice Care Barriers to Discharge: Continued Medical Work up   Patient Goals and CMS Choice Patient states their goals for this hospitalization and ongoing recovery are:: home with hospice CMS Medicare.gov Compare Post Acute Care list provided to:: Patient Choice offered to / list presented to : Patient  Discharge Placement                       Discharge Plan and Services   Discharge Planning Services: CM Consult Post Acute Care Choice: Hospice          DME Arranged:  (Hospice is arranging DME) DME Agency: NA       HH Arranged: RN HH Agency:  ([facility] home hospice) Date HH Agency Contacted: 06/09/20 Time HH Agency Contacted: 1410 Representative spoke with at [facility] Agency: Tim  Social Determinants of Health (SDOH) Interventions     Readmission Risk Interventions Readmission Risk Prevention Plan  06/09/2020 05/20/2020 03/25/2020  Transportation Screening Complete Complete Complete  PCP or Specialist Appt within 3-5 Days - - Complete  HRI or Home Care Consult - - Complete  Social Work Consult for Recovery Care Planning/Counseling - - Complete  Palliative Care Screening - - Not Applicable  Medication Review Oceanographer) Complete Complete Complete  PCP or Specialist appointment within 3-5 days of discharge Complete - -  HRI or Home Care Consult Complete Complete -  SW Recovery Care/Counseling Consult Complete Complete -  Palliative Care Screening Complete Complete -  Skilled Nursing Facility Not Applicable Not Applicable -  Some recent data might be hidden

## 2020-06-09 NOTE — Progress Notes (Signed)
OT Cancellation Note  Patient Details Name: Cody Arellano MRN: [id number] DOB: May 12, 1946   Cancelled Treatment:    Reason Eval/Treat Not Completed: Patient has made the decision to discharge home with hospice.  Per patient, therapies are going to stop.    Avigdor Dollar D Burch [place] 06/09/2020, 12:13 PM

## 2020-06-09 NOTE — Progress Notes (Addendum)
Patient ID: Cody Arellano, male   DOB: 10-20-1945, 75 y.o.   MRN: [id number]     Advanced Heart Failure Rounding Note  PCP-Cardiologist: Mertie Moores, MD   Subjective:    68/34: Asystolic arrest in CIR, ROSC with 1 dose lidocaine and transferred to [place].  On 2H, had episode of torsades/PMVT preceded by R on T phenomenon in setting of prolonged QT interval.  Terminated by 1 shock.  12/29: Extubated.  1/3: Started on vanc + cefepime for possible aspiration pneumonia.  1/4: CT chest with RLL bronchi with mucoid impaction/RLL consolidation. Bilateral pulmonary nodules are concerning for possible metastatic disease.   He continues on milrinone 0.25, co-ox 42% this morning, repeat pending.  Pt appears depressed this morning. Planning palliative care meeting today to discuss [place].  Denies dyspnea. No chest pain.    Objective:   Weight Range: 63.2 kg Body mass index is 18.9 kg/m.   Vital Signs:   Temp:  [97.6 F (36.4 C)-98.3 F (36.8 C)] 98.3 F (36.8 C) (01/06 0833) Pulse Rate:  [61-66] 61 (01/06 0526) Resp:  [19-26] 25 (01/06 0833) BP: (101-110)/(47-72) 102/72 (01/06 0833) SpO2:  [92 %-100 %] 100 % (01/06 0833) Last BM Date: 06/08/20  Weight change: Filed Weights   06/06/20 0635 06/07/20 0426 06/08/20 0446  Weight: 57.2 kg 60.2 kg 63.2 kg    Intake/Output:   Intake/Output Summary (Last 24 hours) at 06/09/2020 0905 Last data filed at 06/08/2020 1915 Gross per 24 hour  Intake 885.96 ml  Output 725 ml  Net 160.96 ml      Physical Exam   CVP 7-8  General:  Chronically ill/frail appearing AAM. No respiratory difficulty HEENT: normal  Neck: supple.  JVD 8-9 cm. Carotids 2+ bilat; no bruits. No lymphadenopathy or thyromegaly appreciated. Cor: PMI nondisplaced. Regular rate & rhythm. 3/6 MR murmur. Lungs: decreased BS at the bases  Abdomen: soft, nontender, nondistended. No hepatosplenomegaly. No bruits or masses. Good bowel sounds. Extremities: no cyanosis, clubbing, rash,  edema + RUE PICC  Neuro: alert & oriented x 3, cranial nerves grossly intact. moves all 4 extremities w/o difficulty. Affect pleasant.   Telemetry   NSR 70s personally reviewed.    Labs    CBC Recent Labs    06/08/20 0913 06/09/20 0500  WBC 7.5 7.5  HGB 8.6* 8.8*  HCT 29.6* 29.3*  MCV 90.8 90.7  PLT 241 196   Basic Metabolic Panel Recent Labs    06/08/20 0913 06/09/20 0500  NA 135 138  K 4.4 4.5  CL 102 103  CO2 24 25  GLUCOSE 161* 135*  BUN 35* 38*  CREATININE 0.80 0.75  CALCIUM 8.1* 8.0*   Liver Function Tests No results for input(s): AST, ALT, ALKPHOS, BILITOT, PROT, ALBUMIN in the last 72 hours. No results for input(s): LIPASE, AMYLASE in the last 72 hours. Cardiac Enzymes No results for input(s): CKTOTAL, CKMB, CKMBINDEX, TROPONINI in the last 72 hours.  BNP: BNP (last 3 results) Recent Labs    04/26/20 1319 04/30/20 0248 05/31/20 1533  BNP 1,824.4* 1,685.5* 1,995.6*    ProBNP (last 3 results) No results for input(s): PROBNP in the last 8760 hours.   D-Dimer No results for input(s): DDIMER in the last 72 hours. Hemoglobin A1C Recent Labs    06/06/20 1600  HGBA1C 5.0   Fasting Lipid Panel Recent Labs    06/06/20 1600  CHOL 92  HDL 52  LDLCALC 34  TRIG 28  CHOLHDL 1.8   Thyroid Function Tests No  results for input(s): TSH, T4TOTAL, T3FREE, THYROIDAB in the last 72 hours.  Invalid input(s): FREET3  Other results:   Imaging    No results found.   Medications:     Scheduled Medications: . apixaban  5 mg Oral BID  . aspirin  81 mg Oral Daily  . azaTHIOprine  100 mg Oral BID  . chlorhexidine gluconate (MEDLINE KIT)  15 mL Mouth Rinse BID  . Chlorhexidine Gluconate Cloth  6 each Topical Daily  . dapagliflozin propanediol  10 mg Oral Daily  . docusate  100 mg Oral BID  . feeding supplement  237 mL Oral TID BM  . feeding supplement (PROSource TF)  45 mL Per Tube QID  . hydroxychloroquine  400 mg Oral QHS  . mexiletine   150 mg Oral Q12H  . pantoprazole sodium  40 mg Oral Daily  . polyethylene glycol  17 g Oral Daily  . rosuvastatin  20 mg Oral Daily  . sodium chloride flush  3 mL Intravenous Q12H  . spironolactone  25 mg Oral Daily  . torsemide  20 mg Oral Daily    Infusions: . sodium chloride 10 mL/hr at 06/08/20 1515  . ceFEPime (MAXIPIME) IV 2 g (06/08/20 2006)  . feeding supplement (OSMOLITE 1.5 CAL) 1,000 mL (06/08/20 2334)  . milrinone 0.251 mcg/kg/min (06/08/20 1515)    PRN Medications: sodium chloride, acetaminophen, guaiFENesin, ondansetron (ZOFRAN) IV, sodium chloride flush, sodium chloride flush   Assessment/Plan   1. Cardiac arrest: Had VT episode 12/7, required DCCV.  85/88 had asystolic arrest in CIR required 1 dose epinephrine and brief CPR.  Torsades/PMVT later on 12/27 in setting of markedly prolonged QT interval and R on T phenomenon, lidocaine started (no amiodarone with markedly long QT). K and Mg normal.  ECG 12/29 QTc prolonged 506 msec. Echo unchanged, head CT negative, HS-TnI mildly elevated to peak 190 in setting of arrests.  - No VT.  - Off lidocaine gtt and switched to mexiletine 150 mg twice a day, continue.  2. Chronic systolic CHF/cardiogenic shock: Ischemic cardiomyopathy. Echo this admission looks worse than 10/21 with EF 20-25% with septal akinesis, mid to apical inferior akinesis, apical anterior and apex akinesis, moderately dilated and dysfunctional RV, moderate TR, severe MR, bicuspid aortic valve with no stenosis, mild aortic insufficiency. Progressive/end-stage CHF worsened by onset of atrial fibrillation. Cardiogenic shock 11/30 with co-ox down to 30%, Impella 5.5 placed to allow time to stabilize and consider further steps. Impella extracted 12/13. 0.125 milrinone added post explant for marginal co-ox. Remains on  milrinone 0.125. He has end-stage biventricular HF. He is not a transplant candidate. Barriers to LVAD include nutritional status/deconditioning and RV.  I think RV would be acceptable. We discussed in [place], was not LVAD candidate at that point with malnutrition/deconditioning. He went to CIR for aggressive PT, had improved nutritional and functional status and was going to go home but had arrest. Repeat echo post-arrest with EF 25%, mildly decreased RV function, moderate MR.  Co-ox low early am but has been good the last few mornings. Volume status looks ok. - Repeat co-ox.   - Continue milrinone 0.25 mcg.   - Continue torsemide 10 mg daily.    - Continue spironolactone 25 daily.  - Continue Farxiga 10 daily.  - No digoxin with intermittent junctional rhythm.  - LVAD workup deferred at this point given finding of possible malignant nodules on chest CT.   3. CAD: Prior history of MI with LAD and ramus PCI. AdmittedMay [phone]  anterior STEMI. LHC with occluded ostial LAD (in-stent), 90% ostial ramus, 60-70% proximal LCx, nondominant RCA. PTCA to LAD and ramus to restore flow, then CABG with LIMA-LAD and SVG-ramus. No chest pain, doubt ACS. Fall in EF from 10/21 to 11/21, but he did not present with ACS. [place] 12/1 showed patient LIMA-LAD, patent native LAD, patent dominant LCx. The SVG-ramus is occluded with severe diffuse in-stent restenosis in proximal ramus. Ramus not intervened upon as this would be a complex intervention and unlikely to markedly improve EF. HS-TnI mildly elevated with cardiac arrest, suspect due to arrest and not ACS.  He has chest tenderness post-CPR. No ischemic CP.  - Continue statin.  4.  AKI: Mild post-arrest. Resolved. SCr 0.75  5. Right pleural effusion: S/p thoracentesis, transudative (CHF).  6. Atrial fibrillation/flutter with RVR: DCCV on 12/3, went back to NSR 12/7.  Maintaining NSR.  - Off amiodarone with long QTc.  - Continue mexiletine.  - Continue apixaban. 7. Mitral regurgitation: TEE 12/3 with moderate central MR. Doubt MitraClip will help him much with moderate MR and he is likely too advanced.  8.  H/o PE: In 10/21. Keep anticoagulated, on apixaban. No bleeding  9. Bicuspid aortic valve: Mild AI, no AS.  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  - Continue Imuran and hydroxychloroquine.  11. Pulmonary nodules: Concern for malignancy/metastatic disease.  Pulmonary now following. Could proceed with aggressive evaluation of lung nodules but would be high risk and require general anesthesia.  Nodules are in a difficult location to biopsy. Will need to make decision between aggressive evaluation and hospice care. Palliative Care meeting planned for today 12. Deconditioning: Continue PT.    13. Suspected Aspiration PNA: Started on vanc+ cefepime 06/07/19.  Procalcitonin was not elevated (0.14).  14. Anemia: Fe deficiency.  Hgb stable at 8.8   - given feraheme 1/5  Family meeting w/ palliative care team planned for today  He and his family will discuss options and will decide whether they want to press forwards with biopsy or aim for a comfort care approach to try to get him home. Even if nodules are infectious and not malignant, he faces a long road to get to LVAD (would need aggressive ongoing PT to mobilize him).   Lyda Jester, PA-C  06/09/2020 9:05 AM  Patient seen with PA, agree with the above note.   Mr Calame has made the decision to go home with [facility] on milrinone rather than any further workup of his lung nodules.  I think this is a reasonable decision. We will work with hospice to try to get him ready for discharge tomorrow.  He can continue his cardiac medications.   Loralie Champagne 06/09/2020 1:18 PM

## 2020-06-09 NOTE — Progress Notes (Addendum)
PT Cancellation Note  Patient Details Name: Cody Arellano MRN: [id number] DOB: [birth year]   Cancelled Treatment:    Reason Eval/Treat Not Completed: (P) Medical issues which prohibited therapy (Pt has decided to go home with hopsice and ask for therapies to stop.  Will defer PT at this time and inform supervising PT of need for d/c from caseload.)   Florestine Avers 06/09/2020, 2:05 PM  Physical Therapy Discharge Patient Details Name: Cody Arellano MRN: [id number] DOB: 07-06-1945 Today's Date: 06/09/2020     Patient discharged from PT services secondary to medical decline - will need to re-order PT to resume therapy services.  Please see latest therapy progress note for current level of functioning and progress toward goals.    Progress and discharge plan discussed with patient and/or caregiver: Patient/Caregiver agrees with plan  GP     Gwendy Boeder Artis Delay 06/09/2020, 2:06 PM  Bonney Leitz , PTA Acute Rehabilitation Services Pager [phone] Office [phone]

## 2020-06-10 ENCOUNTER — Telehealth: Payer: Self-pay

## 2020-06-10 LAB — CBC
HCT: 29.9 % — ABNORMAL LOW (ref 39.0–52.0)
Hemoglobin: 9 g/dL — ABNORMAL LOW (ref 13.0–17.0)
MCH: 27.3 pg (ref 26.0–34.0)
MCHC: 30.1 g/dL (ref 30.0–36.0)
MCV: 90.6 fL (ref 80.0–100.0)
Platelets: 283 10*3/uL (ref 150–400)
RBC: 3.3 MIL/uL — ABNORMAL LOW (ref 4.22–5.81)
RDW: 21.1 % — ABNORMAL HIGH (ref 11.5–15.5)
WBC: 8.4 10*3/uL (ref 4.0–10.5)
nRBC: 4.6 % — ABNORMAL HIGH (ref 0.0–0.2)

## 2020-06-10 LAB — GLUCOSE, CAPILLARY
Glucose-Capillary: 83 mg/dL (ref 70–99)
Glucose-Capillary: 79 mg/dL (ref 70–99)
Glucose-Capillary: 122 mg/dL — ABNORMAL HIGH (ref 70–99)

## 2020-06-10 LAB — COOXEMETRY PANEL
Carboxyhemoglobin: 2 % — ABNORMAL HIGH (ref 0.5–1.5)
Methemoglobin: 0.9 % (ref 0.0–1.5)
O2 Saturation: 55.3 %
Total hemoglobin: 9.1 g/dL — ABNORMAL LOW (ref 12.0–16.0)

## 2020-06-10 LAB — BASIC METABOLIC PANEL
Anion gap: 8 (ref 5–15)
BUN: 29 mg/dL — ABNORMAL HIGH (ref 8–23)
CO2: 26 mmol/L (ref 22–32)
Calcium: 9 mg/dL (ref 8.9–10.3)
Chloride: 103 mmol/L (ref 98–111)
Creatinine, Ser: 0.81 mg/dL (ref 0.61–1.24)
GFR, Estimated: >60 mL/min (ref >60)
Glucose, Bld: 108 mg/dL — ABNORMAL HIGH (ref 70–99)
Potassium: 4.2 mmol/L (ref 3.5–5.1)
Sodium: 137 mmol/L (ref 135–145)

## 2020-06-10 MED ORDER — SPIRONOLACTONE 25 MG PO TABS: 25.0000 mg | ORAL_TABLET | Freq: Every day | ORAL | 5 refills | Status: AC

## 2020-06-10 MED ORDER — DAPAGLIFLOZIN PROPANEDIOL 10 MG PO TABS: 10.0000 mg | ORAL_TABLET | Freq: Every day | ORAL | 5 refills | Status: DC

## 2020-06-10 MED ORDER — MILRINONE LACTATE IN DEXTROSE 20-5 MG/100ML-% IV SOLN: 0.2500 ug/kg/min | INTRAVENOUS | 5 refills | Status: AC

## 2020-06-10 MED ORDER — MEXILETINE HCL 150 MG PO CAPS: 150.0000 mg | ORAL_CAPSULE | Freq: Two times a day (BID) | ORAL | 5 refills | Status: AC

## 2020-06-10 NOTE — Progress Notes (Addendum)
Patient ID: Cody Arellano, male   DOB: 1945/08/23, 75 y.o.   MRN: [id number]     Advanced Heart Failure Rounding Note  PCP-Cardiologist: Mertie Moores, MD   Subjective:    24/26: Asystolic arrest in CIR, ROSC with 1 dose lidocaine and transferred to [place].  On 2H, had episode of torsades/PMVT preceded by R on T phenomenon in setting of prolonged QT interval.  Terminated by 1 shock.  12/29: Extubated.  1/3: Started on vanc + cefepime for possible aspiration pneumonia.  1/4: CT chest with RLL bronchi with mucoid impaction/RLL consolidation. Bilateral pulmonary nodules are concerning for possible metastatic disease.   Pt has decided to go home with [facility] on milrinone rather than any further workup of his lung nodules.  Anticipating d/c to home hospice soon, once equipment is delivered.   Cor-trak removed.   He will continue on milrinone at home.  One milrinone 0.25. Co-ox 55% today. CVp 10-11.  He is comfortable. Denies dyspnea. No CP.    Objective:   Weight Range: 59.8 kg Body mass index is 17.88 kg/m.   Vital Signs:   Temp:  [98.1 F (36.7 C)-98.5 F (36.9 C)] 98.1 F (36.7 C) (01/07 0755) Pulse Rate:  [63-64] 64 (01/07 0755) Resp:  [17-24] 19 (01/07 0755) BP: (99-112)/(55-74) 112/67 (01/07 0755) SpO2:  [97 %-100 %] 99 % (01/07 0755) Weight:  [59.8 kg] 59.8 kg (01/07 0652) Last BM Date: 06/09/20  Weight change: Filed Weights   06/07/20 0426 06/08/20 0446 06/10/20 0652  Weight: 60.2 kg 63.2 kg 59.8 kg    Intake/Output:   Intake/Output Summary (Last 24 hours) at 06/10/2020 0940 Last data filed at 06/10/2020 0700 Gross per 24 hour  Intake --  Output 4300 ml  Net -4300 ml      Physical Exam   CVP 10-11  General:  Thin/frail appearing AAM. No respiratory difficulty HEENT: normal Neck: supple. no JVD. Carotids 2+ bilat; no bruits. No lymphadenopathy or thyromegaly appreciated. Cor: PMI nondisplaced. Regular rate & rhythm. 3/6 MR murmur Lungs:  clear Abdomen: soft, nontender, nondistended. No hepatosplenomegaly. No bruits or masses. Good bowel sounds. Extremities: no cyanosis, clubbing, rash, edema + RUE PICC GU: + foley  Neuro: alert & oriented x 3, cranial nerves grossly intact. moves all 4 extremities w/o difficulty. Affect pleasant.    Telemetry   NSR 64 bpm    Labs    CBC Recent Labs    06/09/20 0500 06/10/20 0500  WBC 7.5 8.4  HGB 8.8* 9.0*  HCT 29.3* 29.9*  MCV 90.7 90.6  PLT 278 834   Basic Metabolic Panel Recent Labs    06/09/20 0500 06/10/20 0500  NA 138 137  K 4.5 4.2  CL 103 103  CO2 25 26  GLUCOSE 135* 108*  BUN 38* 29*  CREATININE 0.75 0.81  CALCIUM 8.0* 9.0   Liver Function Tests No results for input(s): AST, ALT, ALKPHOS, BILITOT, PROT, ALBUMIN in the last 72 hours. No results for input(s): LIPASE, AMYLASE in the last 72 hours. Cardiac Enzymes No results for input(s): CKTOTAL, CKMB, CKMBINDEX, TROPONINI in the last 72 hours.  BNP: BNP (last 3 results) Recent Labs    04/26/20 1319 04/30/20 0248 05/31/20 1533  BNP 1,824.4* 1,685.5* 1,995.6*    ProBNP (last 3 results) No results for input(s): PROBNP in the last 8760 hours.   D-Dimer No results for input(s): DDIMER in the last 72 hours. Hemoglobin A1C No results for input(s): HGBA1C in the last 72 hours. Fasting Lipid  Panel No results for input(s): CHOL, HDL, LDLCALC, TRIG, CHOLHDL, LDLDIRECT in the last 72 hours. Thyroid Function Tests No results for input(s): TSH, T4TOTAL, T3FREE, THYROIDAB in the last 72 hours.  Invalid input(s): FREET3  Other results:   Imaging    No results found.   Medications:     Scheduled Medications:  apixaban  5 mg Oral BID   aspirin  81 mg Oral Daily   azaTHIOprine  100 mg Oral BID   chlorhexidine gluconate (MEDLINE KIT)  15 mL Mouth Rinse BID   Chlorhexidine Gluconate Cloth  6 each Topical Daily   dapagliflozin propanediol  10 mg Oral Daily   docusate  100 mg Oral BID    feeding supplement  237 mL Oral TID BM   hydroxychloroquine  400 mg Oral QHS   mexiletine  150 mg Oral Q12H   pantoprazole sodium  40 mg Oral Daily   polyethylene glycol  17 g Oral Daily   rosuvastatin  20 mg Oral Daily   sodium chloride flush  3 mL Intravenous Q12H   spironolactone  25 mg Oral Daily   torsemide  20 mg Oral Daily    Infusions:  sodium chloride 10 mL/hr at 06/08/20 1515   ceFEPime (MAXIPIME) IV 2 g (06/10/20 0834)   milrinone 0.25 mcg/kg/min (06/09/20 1900)    PRN Medications: sodium chloride, acetaminophen, guaiFENesin, ondansetron (ZOFRAN) IV, sodium chloride flush, sodium chloride flush   Assessment/Plan   1. Cardiac arrest: Had VT episode 12/7, required DCCV.  35/32 had asystolic arrest in CIR required 1 dose epinephrine and brief CPR.  Torsades/PMVT later on 12/27 in setting of markedly prolonged QT interval and R on T phenomenon, lidocaine started (no amiodarone with markedly long QT). K and Mg normal.  ECG 12/29 QTc prolonged 506 msec. Echo unchanged, head CT negative, HS-TnI mildly elevated to peak 190 in setting of arrests.  - No VT.  - Off lidocaine gtt and switched to mexiletine 150 mg twice a day, continue.  2. Chronic systolic CHF/cardiogenic shock: Ischemic cardiomyopathy. Echo this admission looks worse than 10/21 with EF 20-25% with septal akinesis, mid to apical inferior akinesis, apical anterior and apex akinesis, moderately dilated and dysfunctional RV, moderate TR, severe MR, bicuspid aortic valve with no stenosis, mild aortic insufficiency. Progressive/end-stage CHF worsened by onset of atrial fibrillation. Cardiogenic shock 11/30 with co-ox down to 30%, Impella 5.5 placed to allow time to stabilize and consider further steps. Impella extracted 12/13. 0.125 milrinone added post explant for marginal co-ox. Remains on  milrinone 0.125. He has end-stage biventricular HF. He is not a transplant candidate. Barriers to LVAD include  nutritional status/deconditioning and RV. I think RV would be acceptable. We discussed in [place], was not LVAD candidate at that point with malnutrition/deconditioning. He went to CIR for aggressive PT, had improved nutritional and functional status and was going to go home but had arrest. Repeat echo post-arrest with EF 25%, mildly decreased RV function, moderate MR. Co-ox 55%. CVP 10-11. No longer considering VAD given possible malignant nodules on chest CT. Plan now is palliative. Going home w/ hospice.  - Continue milrinone 0.25 mcg at home.  - Continue torsemide 10 mg daily.    - Continue spironolactone 25 daily.  - Continue Farxiga 10 daily.  - No digoxin with intermittent junctional rhythm.   3. CAD: Prior history of MI with LAD and ramus PCI. AdmittedMay 2021with anterior STEMI. LHC with occluded ostial LAD (in-stent), 90% ostial ramus, 60-70% proximal LCx, nondominant RCA. PTCA  to LAD and ramus to restore flow, then CABG with LIMA-LAD and SVG-ramus. No chest pain, doubt ACS. Fall in EF from 10/21 to 11/21, but he did not present with ACS. [place] 12/1 showed patient LIMA-LAD, patent native LAD, patent dominant LCx. The SVG-ramus is occluded with severe diffuse in-stent restenosis in proximal ramus. Ramus not intervened upon as this would be a complex intervention and unlikely to markedly improve EF. HS-TnI mildly elevated with cardiac arrest, suspect due to arrest and not ACS.  He has chest tenderness post-CPR. No ischemic CP.  - Continue statin.  4.  AKI: Mild post-arrest. Resolved. SCr normalized.  5. Right pleural effusion: S/p thoracentesis, transudative (CHF).  6. Atrial fibrillation/flutter with RVR: DCCV on 12/3, went back to NSR 12/7.  Maintaining NSR.  - Off amiodarone with long QTc.  - Continue mexiletine.  - Continue apixaban. 7. Mitral regurgitation: TEE 12/3 with moderate central MR. Doubt MitraClip will help him much with moderate MR and he is likely too advanced.  8. H/o  PE: In 10/21. Keep anticoagulated, on apixaban. No bleeding  9. Bicuspid aortic valve: Mild AI, no AS.  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  - Continue Imuran and hydroxychloroquine.  11. Pulmonary nodules: Concern for malignancy/metastatic disease.  He has made the decision to go home with [facility] on milrinone rather than any further workup of his lung nodules.  I think this is a reasonable decision.   12. Suspected Aspiration PNA: Started on vanc+ cefepime 06/07/19.  Procalcitonin was not elevated (0.14). AF. WBC normal.  14. Anemia: Fe deficiency.  Hgb stable at 9.0   - given feraheme 1/5  Will d/c home w/ hospice once home supplies are delivered and home milrinone connected.   Lyda Jester, PA-C  06/10/2020 9:40 AM  Patient seen with PA, agree with the above note.   He is ready for home with hospice today.  Co-ox 55%, volume status stable.  Will continue current cardiac medication regimen including milrinone except he will stop antibiotics and ASA at discharge.    Meds for home: Milrinone 0.25, torsemide 20 daily, apixaban 5 bid, mexiletine 150 bid, Crestor 20 daily, spironolactone 25 daily, dapagliflozin 10 daily, hydroxychloroquine, azathioprine.    Followup in HF clinic in 10-14 days.   Loralie Champagne 06/10/2020 10:23 AM

## 2020-06-10 NOTE — Discharge Summary (Addendum)
Advanced Heart Failure Team  Discharge Summary   Patient ID: Cody Arellano MRN: [id number], DOB/AGE: 1945/07/03 75 y.o. Admit date: 05/31/2020 D/C date:     06/10/2020   Primary Discharge Diagnoses:  Acute on Chronic Systolic Heart Failure>>Cardiogenic Shock  Bilateral Pulmonary Nodules, Concerning for Probable Metastatic Disease Cardiac Arrest Ventricular Tachycardia CAD  Aspiration PNA Atrial Fibrillation/Flutter  Moderate Mitral Regurgitation  AKI Protein Malnutrition  Deconditioning Iron Deficiency Anemia  Bicuspid Aortic Valve w/ Mild AI  Rt Sided Pleural Effusion s/p Thoracentesis  SLE    Hospital Course:   75 y/o male with complicated PMHx including SLE, COPD, CAD s/p previous PCIs.   First admittedearlier this year in5/21 with anterior STEMI found to have thrombotic occlusion of ostial LAD stent + 90% ISR in ostial ramus with EF 25-30%. L dominant system. Underwent PTCA of LAD lesion with reduction 100% ->50% however unable to stent ramus due to protrusion of the previous ramus stents into LM. IABP placed and taken for emergent CABG with LIMA ->LAD and SVG to RI (Dr. Roxy Manns) Echo in 5/21 EF 45-50% (25% at cath). Post-op course c/b PAF and started on amio (which was eventually stopped).   Admitted 11/23/21with worsening HF symptoms andfailure to thrive. Underwent CT chest with poor opacification of bilateral LL pulmonary arteries. Large right effusion, persistent volume overload/HF and increasing pulmonary nodules. Underwent thoracentesis and bronch/biopsy. Echo obtained and EF read as 30-35%(felt closer to 20-25%on MD reviewwith moderate RV dysfunction and severe MR, mod-severe TE). Progressed to cardiogenic shock.PICC placed.InitialCo-ox 41%. Started on milrinone and increased to 0.375. Developed AFL with RVR. Started on IV amio.Ultimately required mechanical support. Implella 5.5 placed on 11/30. Hemodynamics and renal function improved. Impella extracted 12/13.  Milrinone restarted post extraction for marginal co-ox. Developed hypotension and losartan and spiro discontinued. Midodrine added to support BP. Other medical problems addressed this admit included RLL PNA treated w/ abx and VT requiring DCCV. Regarding long term options for heart failure, he is not a transplant candidate.Barriers to LVAD include nutritional status/deconditioning.Discharged to CIR on on 05/23/20.   He had been doing well in CIR and was set up for discharge 06/01/20 but had witness cardiac arrest on 12/27. Monitor showed asystole with CPR started immediately. Given 1 dose of epinephrine with ROSC. Readmitted to ICU. Had Torsades/PMVT later on 12/27 in setting of markedly prolonged QT interval and R on T phenomenon, lidocaine started (no amiodarone with markedly long QT). Switched to Mexiletine. Stabilized w/ no further arhythmia. Transferred to progressive care unit for continued PT.   Developed PNA post arrest, suspected aspiration event. Treated w/ vanc + cefepime.   VAD w/u was resumed. Chest CT done on 06/07/20 as part of w/u and showed multiple large pulmonary nodules in the lungs bilaterally, concerning for probable metastatic disease. Pulmonology consulted. Unfortunately nodules are in a difficult location to biopsy. Also felt to be high risk for procedure given cardiac condition. Palliative care was consulted to discuss goals of care. Pt made the decision to go home with [facility] hosp[facility] on milrinone rather than any further workup of his lung nodules. No longer a VAD candidate. Home Hospice and home milrinone was arranged. Co-ox was 55% day of d/c on 0.25 mcg/kg/min of milrinone. Volume status stable. He was last seen and examined by Dr. Aundra Dubin on 1/7 and felt stable for discharge home w/ hospice care. Post hospital f/u has been arranged in the [facility].     Discharge Weight Range: 131 lb  Discharge Vitals: Blood pressure [name])  106/58, pulse 62, temperature 98 F (36.7 C), temperature  source Oral, resp. rate 17, height 6' (1.829 m), weight 59.8 kg, SpO2 97 %.  Labs: Lab Results  Component Value Date   WBC 8.4 06/10/2020   HGB 9.0 (L) 06/10/2020   HCT 29.9 (L) 06/10/2020   MCV 90.6 06/10/2020   PLT 283 06/10/2020    Recent Labs  Lab 06/10/20 0500  NA 137  K 4.2  CL 103  CO2 26  BUN 29*  CREATININE 0.81  CALCIUM 9.0  GLUCOSE 108*   Lab Results  Component Value Date   CHOL 92 06/06/2020   HDL 52 06/06/2020   LDLCALC 34 06/06/2020   TRIG 28 06/06/2020   BNP (last 3 results) Recent Labs    04/26/20 1319 04/30/20 0248 05/31/20 1533  BNP 1,824.4* 1,685.5* 1,995.6*    ProBNP (last 3 results) No results for input(s): PROBNP in the last 8760 hours.   Diagnostic Studies/Procedures   No results found.  Discharge Medications   Allergies as of 06/10/2020   No Known Allergies     Medication List    STOP taking these medications   (feeding supplement) PROSource Plus liquid   amiodarone 200 MG tablet Commonly known as: PACERONE   aspirin EC 81 MG tablet   tamsulosin 0.4 MG Caps capsule Commonly known as: FLOMAX     TAKE these medications   apixaban 5 MG Tabs tablet Commonly known as: ELIQUIS Take 1 tablet (5 mg total) by mouth 2 (two) times daily.   azaTHIOprine 50 MG tablet Commonly known as: IMURAN Take 50 mg by mouth 2 (two) times daily.   docusate sodium 100 MG capsule Commonly known as: COLACE Take 1 capsule (100 mg total) by mouth 2 (two) times daily.   ferrous sulfate 325 (65 FE) MG tablet Take 325 mg by mouth daily with breakfast.   hydroxychloroquine 200 MG tablet Commonly known as: PLAQUENIL Take 400 mg by mouth at bedtime.   hydroxypropyl methylcellulose / hypromellose 2.5 % ophthalmic solution Commonly known as: ISOPTO TEARS / GONIOVISC Place 1 drop into both eyes as needed for dry eyes.   mexiletine 150 MG capsule Commonly known as: MEXITIL Take 1 capsule (150 mg total) by mouth 2 (two) times daily.    milrinone 20 MG/100 ML Soln infusion Commonly known as: PRIMACOR Inject 0.0146 mg/min into the vein continuous.   multivitamin with minerals Tabs tablet Take 1 tablet by mouth daily.   oxyCODONE 5 MG immediate release tablet Commonly known as: Oxy IR/ROXICODONE Take 1 tablet (5 mg total) by mouth every 6 (six) hours as needed for severe pain.   polyethylene glycol 17 g packet Commonly known as: MIRALAX / GLYCOLAX Take 17 g by mouth daily as needed for moderate constipation.   polyvinyl alcohol 1.4 % ophthalmic solution Commonly known as: LIQUIFILM TEARS Place 2 drops into both eyes daily.   rosuvastatin 20 MG tablet Commonly known as: CRESTOR Take 20 mg by mouth daily.   spironolactone 25 MG tablet Commonly known as: ALDACTONE Take 1 tablet (25 mg total) by mouth daily. What changed: how much to take   torsemide 20 MG tablet Commonly known as: DEMADEX Take 1 tablet (20 mg total) by mouth daily.            Durable Medical Equipment  (From admission, onward)         Start     Ordered   06/09/20 1312  Heart failure home health orders  (Heart failure home  health orders / Face to face)  Once       Comments: Heart Failure Follow-up Care:  Verify follow-up appointments per Patient Discharge Instructions. Confirm transportation arranged. Reconcile home medications with discharge medication list. Remove discontinued medications from use. Assist patient/caregiver to manage medications using pill box. Reinforce low sodium food selection Assessments: Vital signs and oxygen saturation at each visit. Assess home environment for safety concerns, caregiver support and availability of low-sodium foods. Consult Education officer, museum, PT/OT, Dietitian, and CNA based on assessments. Perform comprehensive cardiopulmonary assessment. Notify MD for any change in condition or weight gain of 3 pounds in one day or 5 pounds in one week with symptoms. Daily Weights and Symptom Monitoring: Ensure  patient has access to scales. Teach patient/caregiver to weigh daily before breakfast and after voiding using same scale and record.    Teach patient/caregiver to track weight and symptoms and when to notify Provider. Activity: Develop individualized activity plan with patient/caregiver.   Question Answer Comment  Heart Failure Follow-up Care Advanced Heart Failure ([facility]F) [facility] at [phone]   Obtain the following labs Basic Metabolic Panel   Lab frequency Weekly   Fax lab results to AHF Clinic at [phone]   Diet Low Sodium Heart Healthy   Fluid restrictions: 2000 mL Fluid      06/09/20 1314          Disposition   The patient will be discharged in stable condition to home.   Follow-up Information    [facility] Follow up.   Why: home hospice with home milrinone       [facility] Follow up on 06/16/2020.   Specialty: Cardiology Why: 8:30 AM  [facility] at [place] information: [id number] [id number] [place] [phone]                Duration of Discharge Encounter: Greater than 35 minutes   Signed, Nelida Gores  06/10/2020, 12:08 PM

## 2020-06-10 NOTE — Telephone Encounter (Signed)
Transition Care Management Unsuccessful Follow-up Telephone Call  Date of discharge and from where:  06/10/2020, Cody Arellano  Attempts:  2nd Attempt  Reason for unsuccessful TCM follow-up call:  No answer/busy

## 2020-06-10 NOTE — TOC Transition Note (Addendum)
Transition of Care [facility]) - CM/SW Discharge Note   Patient Details  Name: Cody Arellano MRN: [id number] Date of Birth: January 21, 1946  Transition of Care [facility] CM/SW Contact:  Zenon Mayo, RN Phone Number: 06/10/2020, 12:57 PM   Clinical Narrative:    Ptar has been scheduled, rep with ptar states there will be a long wait, not sure what eta will be. NCM notified Tim with [facility] that ptar has been called and there is a long wait. NCM also notified wife of this information.   Final next level of care: Home w Hospice Care Barriers to Discharge: No Barriers Identified   Patient Goals and CMS Choice Patient states their goals for this hospitalization and ongoing recovery are:: home with hospice CMS Medicare.gov Compare Post Acute Care list provided to:: Patient Choice offered to / list presented to : Patient  Discharge Placement                       Discharge Plan and Services   Discharge Planning Services: CM Consult Post Acute Care Choice: Hospice          DME Arranged:  (Hospice is arranging DME) DME Agency: NA       HH Arranged: RN [place] Agency:  ([facility] home hospice) Date HH Agency Contacted: 06/09/20 Time [place]: 1410 Representative spoke with at [place]: [place] ([place]) Interventions     Readmission Risk Interventions Readmission Risk Prevention Plan 06/09/2020 05/20/2020 03/25/2020  Transportation Screening Complete Complete Complete  PCP or Specialist Appt within 3-5 Days - - Complete  HRI or [place] - - Complete  Social Work Consult for [place] Planning/Counseling - - Complete  Palliative Care Screening - - Not Applicable  Medication Review Press photographer) Complete Complete Complete  PCP or Specialist appointment within 3-5 days of discharge Complete - -  [place] or Home Care Consult Complete Complete -  SW Recovery Care/Counseling Consult Complete Complete -  Palliative Care Screening  Complete Complete -  [place] Not Applicable Not Applicable -  Some recent data might be hidden

## 2020-06-10 NOTE — Discharge Instructions (Signed)

## 2020-06-10 NOTE — Telephone Encounter (Signed)
Transition Care Management Unsuccessful Follow-up Telephone Call  Date of discharge and from where:  06/10/2020, Cody Arellano  Attempts:  1st Attempt  Reason for unsuccessful TCM follow-up call:  Left voice message

## 2020-06-13 LAB — ACID FAST CULTURE WITH REFLEXED SENSITIVITIES (MYCOBACTERIA)
Acid Fast Culture: NEGATIVE
Acid Fast Culture: NEGATIVE

## 2020-06-13 NOTE — Telephone Encounter (Signed)
Transition Care Management Unsuccessful Follow-up Telephone Call  Date of discharge and from where:  06/10/2020, [name]   Attempts:  3rd Attempt  Reason for unsuccessful TCM follow-up call:  Left voice message

## 2020-06-14 NOTE — Telephone Encounter (Signed)
He has apparently gone home with hospice care. Please see if you can reach him

## 2020-06-15 NOTE — Progress Notes (Incomplete)
Advanced Heart Failure Clinic Note   PCP: Venia Carbon, MD PCP-Cardiologist: Mertie Moores, MD  HF Cardiologist: Dr. Aundra Dubin  HPI: 75 y/o male with complicated PMHx including SLE, COPD, CAD s/p previous PCIs.   First admittedearlier this year in5/21 with anterior STEMI found to have thrombotic occlusion of ostial LAD stent + 90% ISR in ostial ramus with EF 25-30%. L dominant system. Underwent PTCA of LAD lesion with reduction 100% ->50% however unable to stent ramus due to protrusion of the previous ramus stents into LM. IABP placed and taken for emergent CABG with LIMA ->LAD and SVG to RI (Dr. Roxy Manns) Echo in 5/21 EF 45-50% (25% at cath). Post-op course c/b PAF and started on amio (which was eventually stopped).   Admitted 11/23/21with worsening HF symptoms andfailure to thrive. Underwent CT chest with poor opacification of bilateral LL pulmonary arteries. Large right effusion, persistent volume overload/HF and increasing pulmonary nodules. Underwent thoracentesis and bronch/biopsy. Echo obtained and EF read as 30-35%(felt closer to 20-25%on MD reviewwith moderate RV dysfunction and severe MR, mod-severe TE). Progressed to cardiogenic shock.PICC placed.InitialCo-ox 41%. Started on milrinone and increased to 0.375. Developed AFL with RVR. Started on IV amio.Ultimately required mechanical support. Implella 5.5 placed on 11/30. Hemodynamics and renal function improved. Impella extracted 12/13. Milrinone restarted post extraction for marginal co-ox. Developed hypotension and losartan and spiro discontinued. Midodrine added to support BP. Other medical problems addressed this admit included RLL PNA treated w/ abx and VT requiring DCCV. Regarding long term options for heart failure, he is not a transplant candidate.Barriers to LVAD include nutritional status/deconditioning.Discharged to CIR on on 05/23/20.   He had been doing well in CIR and was set up for discharge 12/29/21but had  witness cardiac arrest on 12/27. Monitor showed asystole with CPR started immediately. Given 1 dose of epinephrine with ROSC. Readmitted to ICU. Had Torsades/PMVT later on 12/27 in setting of markedly prolonged QT interval and R on T phenomenon, lidocaine started (no amiodarone with markedly long QT). Switched to Mexiletine. Stabilized w/ no further arhythmia. Transferred to progressive care unit for continued PT.   Developed PNA post arrest, suspected aspiration event. Treated w/ vanc + cefepime.   VAD w/u was resumed. Chest CT done on 06/07/20 as part of w/u and showed multiple large pulmonary nodules in the lungs bilaterally, concerning for probable metastatic disease. Pulmonology consulted. Unfortunately nodules are in a difficult location to biopsy. Also felt to be high risk for procedure given cardiac condition. Palliative care was consulted to discuss goals of care. Pt made the decision to go home with [facility] hosp[facility] on milrinone rather than any further workup of his lung nodules. No longer a VAD candidate. Home Hospice and home milrinone was arranged. Co-ox was 55% day of d/c on 0.25 mcg/kg/min of milrinone. Volume status stable. Discharged home w/ hospice care. Discharge weight 131 lbs.  Today he returns for HF follow up. Overall feeling fine. Denies increasing SOB, CP, dizziness, edema, or PND/Orthopnea. Appetite ok. No fever or chills. Weight at home 170 pounds. Taking all medications.     ROS:  Please see the history of present illness.   All other systems are personally reviewed and negative.   Past Medical History:  Diagnosis Date  . Anxiety   . CHF (congestive heart failure) ([place])   . Chronic tension headache   . Colon polyps   . Coronary artery disease 2008/2009   MI with PCI x 2, then PCI x 1 in 2009  . Depression   .  Dyslipidemia   . Dysrhythmia    atrial fibrillation  . Hyperlipidemia   . Hypertension   . Lupus [facility])    sees Dr Trudie Reed  . Lupus disease of the lung    . 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 Myocardial infarction ([place]) 2008  . 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 Shortness of breath     Current Outpatient Medications  Medication Sig Dispense Refill  . apixaban (ELIQUIS) 5 MG TABS tablet Take 1 tablet (5 mg total) by mouth 2 (two) times daily. 60 tablet 0  . azaTHIOprine (IMURAN) 50 MG tablet Take 50 mg by mouth 2 (two) times daily.  2  . docusate sodium (COLACE) 100 MG capsule Take 1 capsule (100 mg total) by mouth 2 (two) times daily. 10 capsule 0  . ferrous sulfate 325 (65 FE) MG tablet Take 325 mg by mouth daily with breakfast.    . hydroxychloroquine (PLAQUENIL) 200 MG tablet Take 400 mg by mouth at bedtime.     . hydroxypropyl methylcellulose / hypromellose (ISOPTO TEARS / GONIOVISC) 2.5 % ophthalmic solution Place 1 drop into both eyes as needed for dry eyes.    [name] mexiletine (MEXITIL) 150 MG capsule Take 1 capsule (150 mg total) by mouth 2 (two) times daily. 60 capsule 5  . milrinone (PRIMACOR) 20 MG/100 ML SOLN infusion Inject 0.0146 mg/min into the vein continuous. 100 mL 5  . Multiple Vitamin (MULTIVITAMIN WITH MINERALS) TABS tablet Take 1 tablet by mouth daily.    [name] oxyCODONE (OXY IR/ROXICODONE) 5 MG immediate release tablet Take 1 tablet (5 mg total) by mouth every 6 (six) hours as needed for severe pain. 30 tablet 0  . polyethylene glycol (MIRALAX / GLYCOLAX) 17 g packet Take 17 g by mouth daily as needed for moderate constipation.     . polyvinyl alcohol (LIQUIFILM TEARS) 1.4 % ophthalmic solution Place 2 drops into both eyes daily.     . rosuvastatin (CRESTOR) 20 MG tablet Take 20 mg by mouth daily.    [name] spironolactone (ALDACTONE) 25 MG tablet Take 1 tablet (25 mg total) by mouth daily. 30 tablet 5  . torsemide (DEMADEX) 20 MG tablet Take 1 tablet (20 mg total) by mouth daily.     No current facility-administered medications for this visit.    No Known Allergies     Social History   Socioeconomic History  . Marital status: Married    Spouse name: Not on file  . Number of children: 5  . Years of education: Not on file  . Highest education level: Not on file  Occupational History  . Occupation: Engineer, production for [employer]    Comment: retired  Tobacco Use  . Smoking status: Former Smoker    Packs/day: 1.00    Years: 20.00    Pack years: 20.00    Types: Cigarettes    Quit date: 02/26/1993    Years since quitting: 27.3  . Smokeless tobacco: Never Used  . Tobacco comment: QUIT SMOKING 20 YEARS AGO  Vaping Use  . Vaping Use: Never used  Substance and Sexual Activity  . Alcohol use: No  . Drug use: No  . Sexual activity: Not on file  Other Topics Concern  . Not on file  Social History Narrative   No living will   Wife should make health care decisions--alternate is sons   Would accept resuscitation   Not  sure about tube feeds   Social Determinants of Health   Financial Resource Strain: Not on file  Food Insecurity: No Food Insecurity  . Worried About Charity fundraiser in the Last Year: Never true  . Ran Out of Food in the Last Year: Never true  Transportation Needs: No Transportation Needs  . Lack of Transportation (Medical): No  . Lack of Transportation (Non-Medical): No  Physical Activity: Insufficiently Active  . Days of Exercise per Week: 4 days  . Minutes of Exercise per Session: 10 min  Stress: Not on file  Social Connections: Not on file  Intimate Partner Violence: Not on file      Family History  Problem Relation Age of Onset  . Heart disease Mother   . Alcohol abuse Father   . Hyperlipidemia Sister   . Hypertension Sister   . Hyperlipidemia Brother   . Hypertension Brother   . Cancer Brother        ?lung cancer  . Stomach cancer Brother   . Diabetes Paternal Uncle   . Colon cancer Neg Hx   . Esophageal cancer Neg Hx   . Rectal cancer Neg Hx     There were no vitals filed for this  visit.   PHYSICAL EXAM: General:  Thin/frail appearing AAM. No respiratory difficulty HEENT: normal Neck: supple. no JVD. Carotids 2+ bilat; no bruits. No lymphadenopathy or thyromegaly appreciated. Cor: PMI nondisplaced. Regular rate & rhythm. 3/6 MR murmur Lungs: clear Abdomen: soft, nontender, nondistended. No hepatosplenomegaly. No bruits or masses. Good bowel sounds. Extremities: no cyanosis, clubbing, rash, edema + RUE PICC Neuro: alert & oriented x 3, cranial nerves grossly intact. moves all 4 extremities w/o difficulty. Affect pleasant  ECG:   ASSESSMENT & PLAN: 1. Cardiac arrest: Had VT episode 12/7, required DCCV.  50/53 had asystolic arrest in CIR required 1 dose epinephrine and brief CPR.  Torsades/PMVT later on 12/27 in setting of markedly prolonged QT interval and R on T phenomenon, lidocaine started (no amiodarone with markedly long QT). K and Mg normal.  ECG 12/29 QTc prolonged 506 msec. Echo unchanged, head CT negative, HS-TnI mildly elevated to peak 190 in setting of arrests.  - No VT.  - Continue mexiletine 150 mg bid - BMET, mag today 2. Chronic systolic CHF/cardiogenic shock: Ischemic cardiomyopathy. Echo this admission looks worse than 10/21 with EF 20-25% with septal akinesis, mid to apical inferior akinesis, apical anterior and apex akinesis, moderately dilated and dysfunctional RV, moderate TR, severe MR, bicuspid aortic valve with no stenosis, mild aortic insufficiency. Progressive/end-stage CHF worsened by onset of atrial fibrillation. Cardiogenic shock 11/30 with co-ox down to 30%, Impella 5.5 placed to allow time to stabilize and consider further steps. Impella extracted 12/13. 0.125 milrinone added post explant for marginal co-ox. Remains on  milrinone 0.125. He has end-stage biventricular HF. He is not a transplant candidate. Barriers to LVAD include nutritional status/deconditioning and RV. I think RV would be acceptable. We discussed in [place], was not LVAD  candidate at that point with malnutrition/deconditioning. He went to CIR for aggressive PT, had improved nutritional and functional status and was going to go home but had arrest. Repeat echo post-arrest with EF 25%, mildly decreased RV function, moderate MR. No longer considering VAD given possible malignant nodules on chest CT. Plan now is palliative. Home w/ hospice.  - Continue milrinone 0.25 mcg at home.  - Continue torsemide 10 mg daily.    - Continue spironolactone 25 daily.  - Continue [place]  10 daily.  - No digoxin with intermittent junctional rhythm.   3. CAD: Prior history of MI with LAD and ramus PCI. AdmittedMay 2021with anterior STEMI. LHC with occluded ostial LAD (in-stent), 90% ostial ramus, 60-70% proximal LCx, nondominant RCA. PTCA to LAD and ramus to restore flow, then CABG with LIMA-LAD and SVG-ramus. No chest pain, doubt ACS. Fall in EF from 10/21 to 11/21, but he did not present with ACS. [place] 12/1 showed patient LIMA-LAD, patent native LAD, patent dominant LCx. The SVG-ramus is occluded with severe diffuse in-stent restenosis in proximal ramus. Ramus not intervened upon as this would be a complex intervention and unlikely to markedly improve EF. HS-TnI mildly elevated with cardiac arrest, suspect due to arrest and not ACS.  He has chest tenderness post-CPR. No ischemic CP.  - Continue statin.  4.AKI: Mild post-arrest. Resolved. SCr normalized.  - BMET today 5. Right pleural effusion: S/p thoracentesis, transudative (CHF). 6. Atrial fibrillation/flutter with RVR: DCCV on 12/3, went back to NSR 12/7.  Maintaining NSR.  - Off amiodarone with long QTc.  - Continue mexiletine.  - Continue apixaban. - CBC today 7. Mitral regurgitation: TEE 12/3 with moderate central MR. Doubt MitraClip will help him much with moderate MR and he is likely too advanced. 8. H/o PE: In 10/21. Keep anticoagulated, on apixaban. No bleeding 9.Bicuspid aortic valve: Mild AI, no AS.   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  - Continue Imuran and hydroxychloroquine.  11. Pulmonary nodules: Concern for malignancy/metastatic disease.  He has made the decision to go home with [facility] hosp[facility] on milrinone rather than any further workup of his lung nodules.  I think this is a reasonable decision.   12. Suspected Aspiration PNA: Started on vanc+ cefepime 06/07/19.  Procalcitonin was not elevated (0.14). AF. WBC normal.  14. Anemia: Fe deficiency.  Hgb stable at 9.0   - given feraheme 1/5  Follow up in weeks   [place], [place] 06/15/20

## 2020-06-16 ENCOUNTER — Encounter (HOSPITAL_COMMUNITY): Payer: No Typology Code available for payment source

## 2020-06-16 NOTE — Telephone Encounter (Signed)
Form done No charge Find out if they want me to put him on my home visit list

## 2020-06-16 NOTE — Telephone Encounter (Signed)
Left message to call office

## 2020-06-16 NOTE — Telephone Encounter (Signed)
Spoke to pt's wife. She said hospice is helping and doing well. She needs a handicap placard. Please call [phone] when ready. Form placed in Dr Alla German inbox on his desk

## 2020-06-16 NOTE — Telephone Encounter (Signed)
Spoke to pt's wife. She said she would like to be put on the home visit.

## 2020-06-16 NOTE — Telephone Encounter (Signed)
Okay--will do Should be able to get out within 2 weeks

## 2020-06-20 LAB — ECHOCARDIOGRAM LIMITED
Height: 72 in
Weight: 2215.18 oz

## 2020-06-30 ENCOUNTER — Other Ambulatory Visit (HOSPITAL_COMMUNITY): Payer: Self-pay | Admitting: Cardiology

## 2020-06-30 ENCOUNTER — Encounter: Payer: Self-pay | Admitting: Internal Medicine

## 2020-06-30 ENCOUNTER — Ambulatory Visit: Payer: No Typology Code available for payment source | Admitting: Internal Medicine

## 2020-06-30 DIAGNOSIS — E43 Unspecified severe protein-calorie malnutrition: Secondary | ICD-10-CM | POA: Diagnosis not present

## 2020-06-30 DIAGNOSIS — I25119 Atherosclerotic heart disease of native coronary artery with unspecified angina pectoris: Secondary | ICD-10-CM

## 2020-06-30 DIAGNOSIS — I480 Paroxysmal atrial fibrillation: Secondary | ICD-10-CM

## 2020-06-30 DIAGNOSIS — J849 Interstitial pulmonary disease, unspecified: Secondary | ICD-10-CM | POA: Diagnosis not present

## 2020-06-30 DIAGNOSIS — I5022 Chronic systolic (congestive) heart failure: Secondary | ICD-10-CM

## 2020-06-30 DIAGNOSIS — F39 Unspecified mood [affective] disorder: Secondary | ICD-10-CM

## 2020-06-30 NOTE — Assessment & Plan Note (Signed)
Regular now Is on eliquis

## 2020-06-30 NOTE — Assessment & Plan Note (Signed)
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

## 2020-06-30 NOTE — Assessment & Plan Note (Signed)
Has lost even more weight Taking some ensure plus now and wife tries to encourage him to eat

## 2020-06-30 NOTE — Assessment & Plan Note (Signed)
Stable angina at this point Goes away with rest Extremely limited functional status Is on hospice

## 2020-06-30 NOTE — Progress Notes (Signed)
Subjective:    Patient ID: Cody Arellano, male    DOB: 12-04-1945, 75 y.o.   MRN: [id number]  HPI Home visit for review of recent complicated hospitalization Wife is here  Currently on hospice with [facility] Also still on IV milrinone  Had cardiogenic shock Needed LV assist device Long protracted stay--then went to inpatient rehab Near the end of that course, he had cardiac arrest but was resuscitated Back to CCU Finally stabilized on IV milrinone and sent home  Staying upstairs at his house--not strong enough to get downstairs Has hospital bed in bedroom Wife brings food up to him Hospice aides twice a week for sponge bath--can't get in shower with PICC  Hasn't been using the walker--and fell 2 days ago (fortunately no sig injury) Urged him to use the walker all the time!  Gets chest pain with exertion---it will persist a while after sitting---then fade away Easy DOE as well No edema No palpitations  Wife notes that he gets angry and irritable at times Mostly related to being shut up upstairs in house and can't get out (she thinks)  Current Outpatient Medications on File Prior to Visit  Medication Sig Dispense Refill  . azaTHIOprine (IMURAN) 50 MG tablet Take 50 mg by mouth 2 (two) times daily.  2  . docusate sodium (COLACE) 100 MG capsule Take 1 capsule (100 mg total) by mouth 2 (two) times daily. 10 capsule 0  . ferrous sulfate 325 (65 FE) MG tablet Take 325 mg by mouth daily with breakfast.    . hydroxychloroquine (PLAQUENIL) 200 MG tablet Take 400 mg by mouth at bedtime.     . hydroxypropyl methylcellulose / hypromellose (ISOPTO TEARS / GONIOVISC) 2.5 % ophthalmic solution Place 1 drop into both eyes as needed for dry eyes.    [name] mexiletine (MEXITIL) 150 MG capsule Take 1 capsule (150 mg total) by mouth 2 (two) times daily. 60 capsule 5  . milrinone (PRIMACOR) 20 MG/100 ML SOLN infusion Inject 0.0146 mg/min into the vein continuous. 100 mL 5  . Multiple Vitamin  (MULTIVITAMIN WITH MINERALS) TABS tablet Take 1 tablet by mouth daily.    [name] oxyCODONE (OXY IR/ROXICODONE) 5 MG immediate release tablet Take 1 tablet (5 mg total) by mouth every 6 (six) hours as needed for severe pain. 30 tablet 0  . polyethylene glycol (MIRALAX / GLYCOLAX) 17 g packet Take 17 g by mouth daily as needed for moderate constipation.     . polyvinyl alcohol (LIQUIFILM TEARS) 1.4 % ophthalmic solution Place 2 drops into both eyes daily.     . rosuvastatin (CRESTOR) 20 MG tablet Take 20 mg by mouth daily.    [name] spironolactone (ALDACTONE) 25 MG tablet Take 1 tablet (25 mg total) by mouth daily. 30 tablet 5  . torsemide (DEMADEX) 20 MG tablet Take 1 tablet (20 mg total) by mouth daily.    [name] apixaban (ELIQUIS) 5 MG TABS tablet Take 1 tablet (5 mg total) by mouth 2 (two) times daily. 60 tablet 0   No current facility-administered medications on file prior to visit.    No Known Allergies  Past Medical History:  Diagnosis Date  . Anxiety   . CHF (congestive heart failure) ([place])   . Chronic tension headache   . Colon polyps   . Coronary artery disease 2008/2009   MI with PCI x 2, then PCI x 1 in 2009  . Depression   . Dyslipidemia   . Dysrhythmia    atrial fibrillation  .  Hyperlipidemia   . Hypertension   . Lupus [facility])    sees Dr Trudie Reed  . Lupus disease of the lung   . 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 Myocardial infarction ([place]) 2008  . 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 Shortness of breath     Past Surgical History:  Procedure Laterality Date  . BRONCHIAL WASHINGS  04/29/2020   Procedure: BRONCHIAL WASHINGS;  Surgeon: Garner Nash, DO;  Location: [place] [facility];  Service: Pulmonary;;  . CARDIAC CATHETERIZATION    . CLIPPING OF ATRIAL APPENDAGE N/A 10/17/2019   Procedure: Clipping Of Atrial Appendage using AtriCure [id number] 45 MM AtriClip.;  Surgeon: Rexene Alberts, MD;  Location: [place];  Service: Open Heart  Surgery;  Laterality: N/A;  . CORONARY ARTERY BYPASS GRAFT N/A 10/17/2019   Procedure: CORONARY ARTERY BYPASS GRAFTING (CABG) using LIMA to LAD; Endoscopic harvest right greater saphenous vein: SVG to RAMUS.;  Surgeon: Rexene Alberts, MD;  Location: [place];  Service: Open Heart Surgery;  Laterality: N/A;  . CORONARY BALLOON ANGIOPLASTY N/A 10/17/2019   Procedure: CORONARY BALLOON ANGIOPLASTY;  Surgeon: Nelva Bush, MD;  Location: [place] [facility];  Service: Cardiovascular;  Laterality: N/A;  . coronary stents  2009  . CORONARY/GRAFT ACUTE MI REVASCULARIZATION N/A 10/17/2019   Procedure: Coronary/Graft Acute MI Revascularization;  Surgeon: Nelva Bush, MD;  Location: [place] [facility];  Service: Cardiovascular;  Laterality: N/A;  . CORONARY/GRAFT ANGIOGRAPHY N/A 05/04/2020   Procedure: CORONARY/GRAFT ANGIOGRAPHY;  Surgeon: Larey Dresser, MD;  Location: [place] [facility];  Service: Cardiovascular;  Laterality: N/A;  . ENDOVEIN HARVEST OF GREATER SAPHENOUS VEIN Right 10/17/2019   Procedure: [place] Of Greater Saphenous Vein;  Surgeon: Rexene Alberts, MD;  Location: [place];  Service: Open Heart Surgery;  Laterality: Right;  . IABP INSERTION N/A 10/17/2019   Procedure: IABP Insertion;  Surgeon: Nelva Bush, MD;  Location: [place] [facility];  Service: Cardiovascular;  Laterality: N/A;  . INCISION AND DRAINAGE ABSCESS N/A 01/29/2020   Procedure: INCISION AND DRAINAGE BILATERAL PERIRECTAL ABSCESS;  Surgeon: Stark Klein, MD;  Location: [place];  Service: General;  Laterality: N/A;  . IR THORACENTESIS ASP PLEURAL SPACE W/IMG GUIDE  10/26/2019  . IR THORACENTESIS ASP PLEURAL SPACE W/IMG GUIDE  04/27/2020  . LEFT HEART CATH AND CORS/GRAFTS ANGIOGRAPHY N/A 05/04/2020   Procedure: LEFT HEART CATH AND CORS/GRAFTS ANGIOGRAPHY;  Surgeon: Larey Dresser, MD;  Location: [place] [facility];  Service: Cardiovascular;  Laterality: N/A;  . PLACEMENT OF IMPELLA LEFT VENTRICULAR ASSIST DEVICE  Right 05/03/2020   Procedure: PLACEMENT OF IMPELLA 5.5 LEFT VENTRICULAR ASSIST DEVICE VIA RIGHT AXILLARY;  Surgeon: [name], MD;  Location: [place];  Service: Open Heart Surgery;  Laterality: Right;  . REMOVAL OF IMPELLA LEFT VENTRICULAR ASSIST DEVICE N/A 05/16/2020   Procedure: REMOVAL OF IMPELLA LEFT VENTRICULAR ASSIST DEVICE;  Surgeon: Ivin Poot, MD;  Location: [place];  Service: Open Heart Surgery;  Laterality: N/A;  . RIGHT/LEFT HEART CATH AND CORONARY ANGIOGRAPHY N/A 10/17/2019   Procedure: RIGHT/LEFT HEART CATH AND CORONARY ANGIOGRAPHY;  Surgeon: Nelva Bush, MD;  Location: [place] [facility];  Service: Cardiovascular;  Laterality: N/A;  . TEE WITHOUT CARDIOVERSION  10/17/2019   Procedure: Transesophageal Echocardiogram (Tee);  Surgeon: Rexene Alberts, MD;  Location: [facility] OR;  Service: Open Heart Surgery;;  . TEE WITHOUT CARDIOVERSION N/A 05/03/2020   Procedure:  TRANSESOPHAGEAL ECHOCARDIOGRAM (TEE);  Surgeon: [name], MD;  Location: [place];  Service: Open Heart Surgery;  Laterality: N/A;  . TEE WITHOUT CARDIOVERSION N/A 05/06/2020   Procedure: TRANSESOPHAGEAL ECHOCARDIOGRAM (TEE);  Surgeon: Larey Dresser, MD;  Location: [facility];  Service: Cardiovascular;  Laterality: N/A;  bedside  . VIDEO BRONCHOSCOPY N/A 04/29/2020   Procedure: VIDEO BRONCHOSCOPY WITHOUT FLUORO;  Surgeon: Garner Nash, DO;  Location: [place];  Service: Pulmonary;  Laterality: N/A;    Family History  Problem Relation Age of Onset  . Heart disease Mother   . Alcohol abuse Father   . Hyperlipidemia Sister   . Hypertension Sister   . Hyperlipidemia Brother   . Hypertension Brother   . Cancer Brother        ?lung cancer  . Stomach cancer Brother   . Diabetes Paternal Uncle   . Colon cancer Neg Hx   . Esophageal cancer Neg Hx   . Rectal cancer Neg Hx     Social History   Socioeconomic History  . Marital status: Married    Spouse name: Not on file  . Number of children:  5  . Years of education: Not on file  . Highest education level: Not on file  Occupational History  . Occupation: Engineer, production for [employer]    Comment: retired  Tobacco Use  . Smoking status: Former Smoker    Packs/day: 1.00    Years: 20.00    Pack years: 20.00    Types: Cigarettes    Quit date: 02/26/1993    Years since quitting: 27.3  . Smokeless tobacco: Never Used  . Tobacco comment: QUIT SMOKING 20 YEARS AGO  Vaping Use  . Vaping Use: Never used  Substance and Sexual Activity  . Alcohol use: No  . Drug use: No  . Sexual activity: Not on file  Other Topics Concern  . Not on file  Social History Narrative   No living will   Wife should make health care decisions--alternate is sons   Would accept resuscitation   Not sure about tube feeds   Social Determinants of Health   Financial Resource Strain: Not on file  Food Insecurity: No Food Insecurity  . Worried About Charity fundraiser in the Last Year: Never true  . Ran Out of Food in the Last Year: Never true  Transportation Needs: No Transportation Needs  . Lack of Transportation (Medical): No  . Lack of Transportation (Non-Medical): No  Physical Activity: Insufficiently Active  . Days of Exercise per Week: 4 days  . Minutes of Exercise per Session: 10 min  Stress: Not on file  Social Connections: Not on file  Intimate Partner Violence: Not on file   Review of Systems Eating is sporadic---he has lost 15# more since the last time I saw him Sleeps okay No sig back or joint pains Bowels are moving okay No sig cough or fever Voids okay     Objective:   Physical Exam Constitutional:      Comments: Marked wasting now  Cardiovascular:     Rate and Rhythm: Regular rhythm.     Heart sounds: No murmur heard. No gallop.   Pulmonary:     Effort: Pulmonary effort is normal. No respiratory distress.     Breath sounds: Normal breath sounds. No wheezing.  Musculoskeletal:     Cervical back: Neck  supple.     Right lower leg: No edema.     Left lower leg:  No edema.  Lymphadenopathy:     Cervical: No cervical adenopathy.  Neurological:     Mental Status: He is alert.     Comments: Is able to stand and sit independently. Walks at fair pace with walker No focal weakness  Psychiatric:        Mood and Affect: Mood normal.        Behavior: Behavior normal.            Assessment & Plan:

## 2020-06-30 NOTE — Assessment & Plan Note (Signed)
Severe LV dysfunction  Not considered candidate for LVAD at this point Still on IV milrinone No active exacerbation at this point

## 2020-06-30 NOTE — Assessment & Plan Note (Signed)
Having some anger and irritability Would hold off on Rx for now Discussed with wife--okay to have him go downstairs for special occasions (visitors) as long as 2 strong men can help him down and back up again

## 2020-07-12 ENCOUNTER — Telehealth (HOSPITAL_COMMUNITY): Payer: Self-pay | Admitting: *Deleted

## 2020-07-12 NOTE — Telephone Encounter (Signed)
I received a call from [place] with [facility] stating pt and pts wife want to stop milrinone. Tim call back # [phone]. Per Dr.McLean contact [facility] and have their physician discuss stopping milrinone with pt. [facility] contacted [phone] left message with nurse.

## 2020-09-02 DEATH — deceased

## 2022-02-06 ENCOUNTER — Encounter: Payer: Self-pay | Admitting: Cardiovascular Disease

## 2022-08-23 IMAGING — DX DG CHEST 1V PORT
1 series · 1 of 1 positions shown · non-contrast
Comparison: 03/09/2020

CLINICAL DATA: Nausea, not eating, chills, shortness of breath, and
generalized weakness.

EXAM:
PORTABLE CHEST 1 VIEW

[chest]
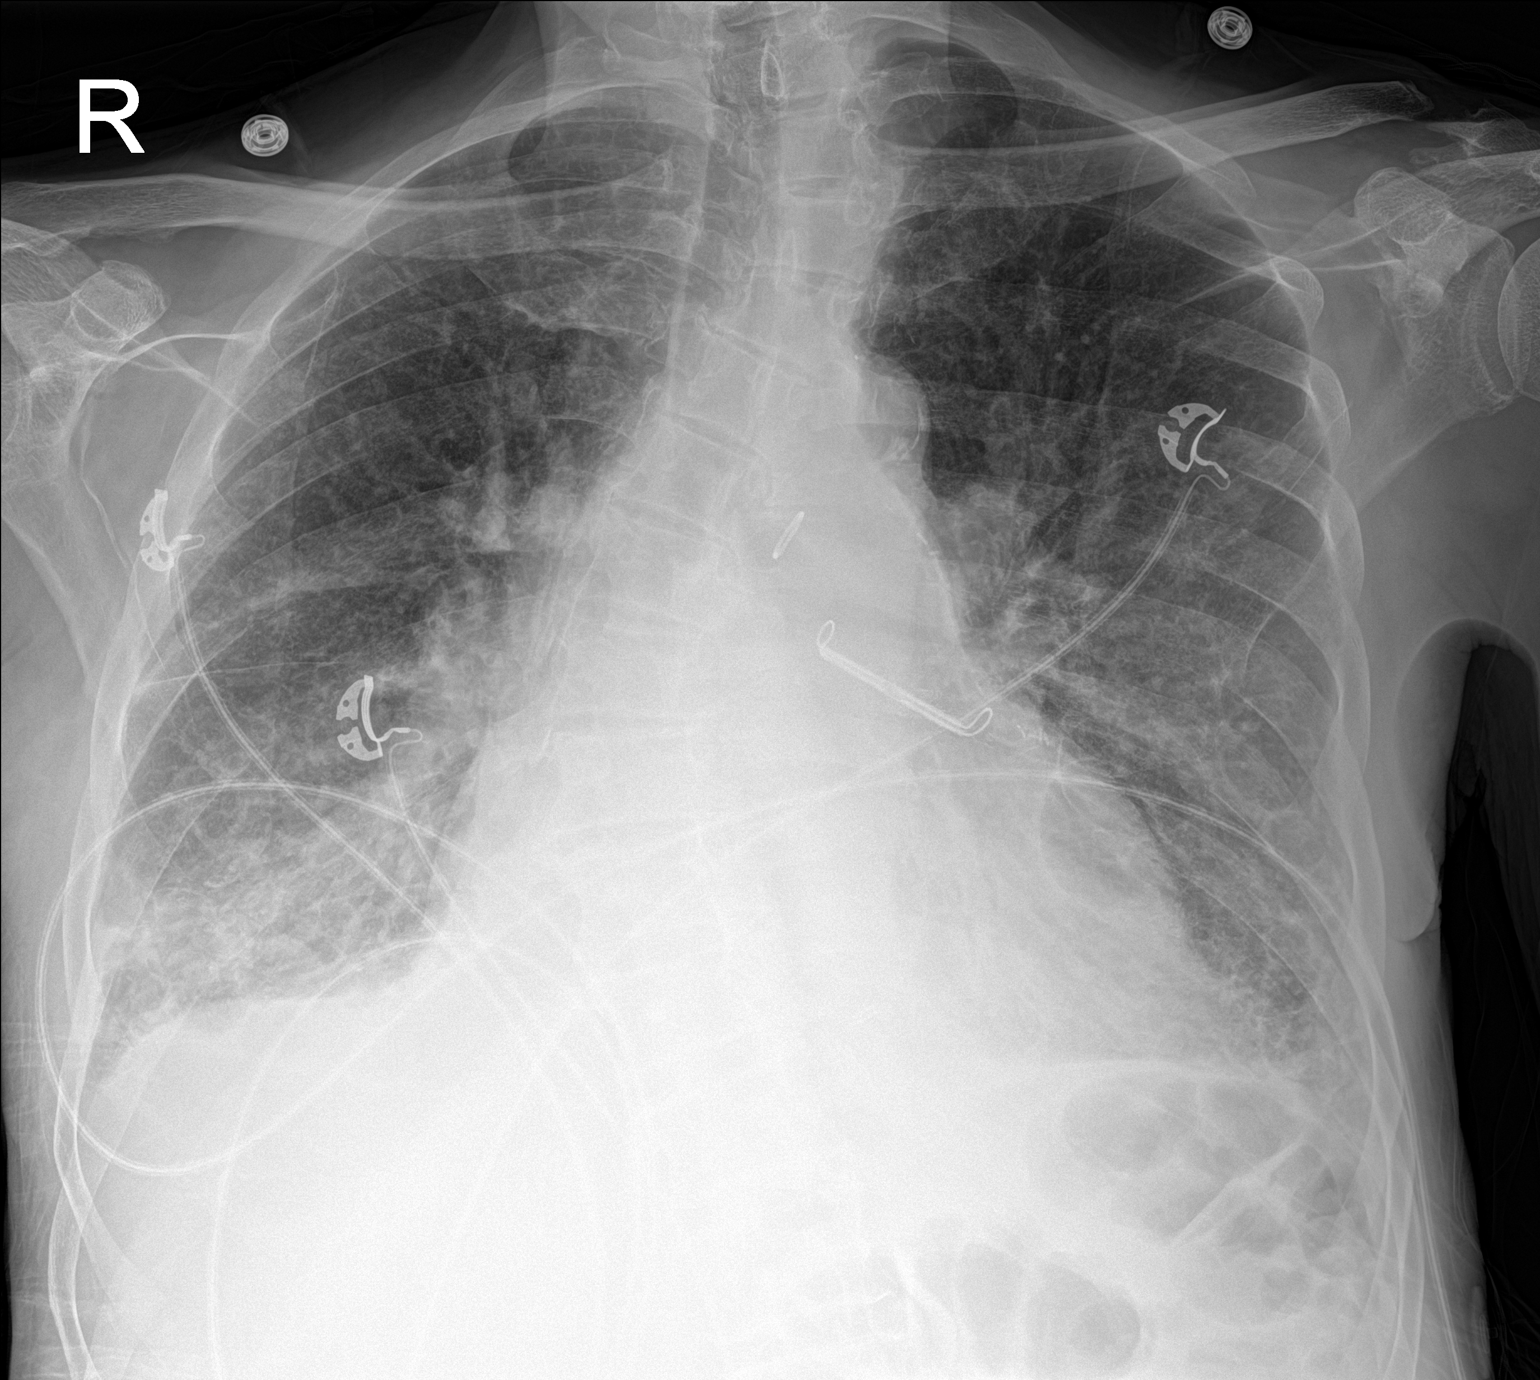

[1 of 1 positions shown; findings below may reference images not displayed]

FINDINGS: Postoperative changes in the mediastinum. Diffuse cardiac
enlargement. Pulmonary vascular congestion. Increasing interstitial
and alveolar infiltrates in the lungs suggesting progressing edema
or possibly developing pneumonia. Small bilateral pleural effusions.
No pneumothorax. Calcification of the aorta. Thoracic scoliosis
convex towards the right.
IMPRESSION: Cardiac enlargement with pulmonary vascular congestion and
increasing bilateral interstitial and alveolar infiltrates
suggesting progressing edema or developing pneumonia.

## 2022-09-27 IMAGING — US IR THORACENTESIS ASP PLEURAL SPACE W/IMG GUIDE
1 series · 2 of 2 positions shown · non-contrast
Comparison: none

INDICATION: Patient with history of lupus, interstitial disease, CAD, chronic
systolic CHF, dyspnea, and right pleural effusion. Request made for
diagnostic and therapeutic right thoracentesis.

[Series 1: ir (id) (id)/(id)/(id) ir · 2 of 2 slices shown]
[im 1/2]
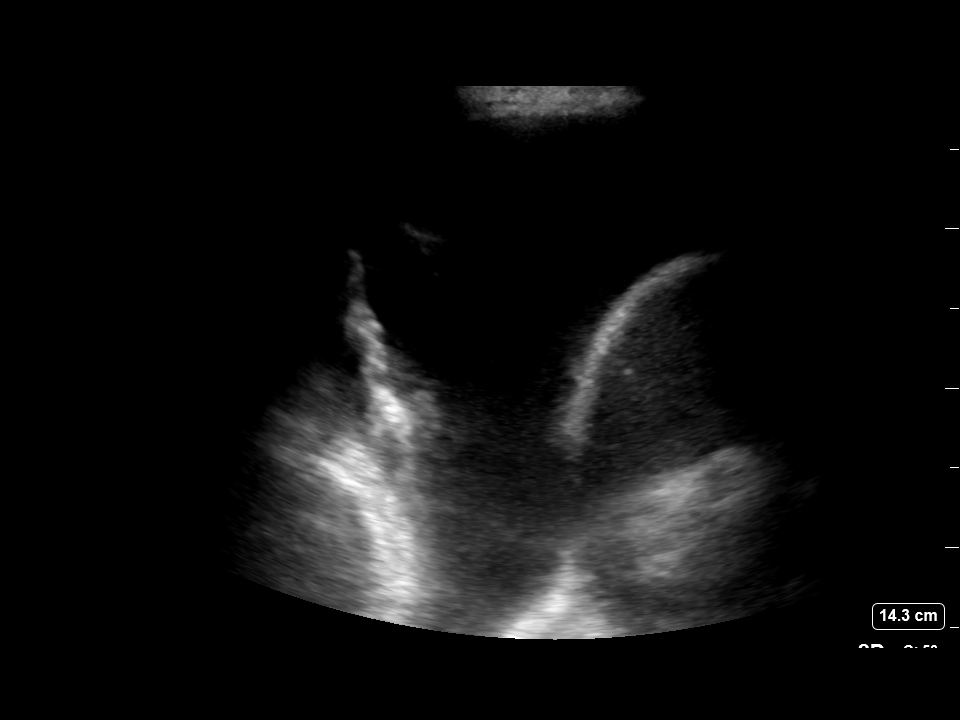
[im 2/2]
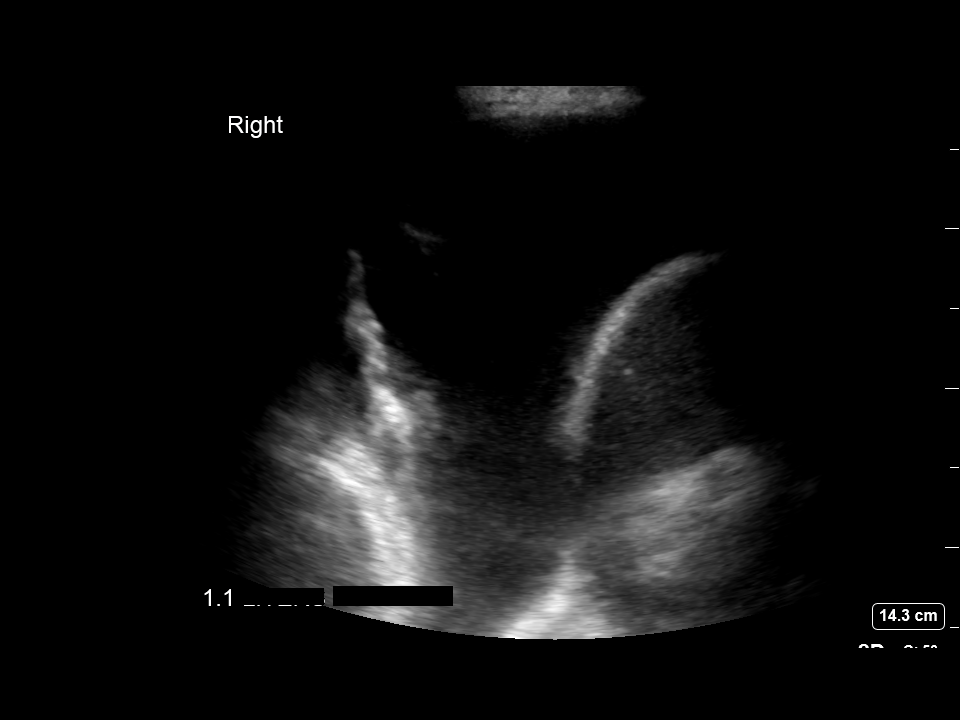

[2 of 2 positions shown; findings below may reference images not displayed]

EXAM:
ULTRASOUND GUIDED DIAGNOSTIC AND THERAPEUTIC RIGHT THORACENTESIS

MEDICATIONS:
10 mL% lidocaine

COMPLICATIONS:
None immediate.  No pneumothorax on follow-up radiograph.

PROCEDURE:
An ultrasound guided thoracentesis was thoroughly discussed with the
patient and questions answered. The benefits, risks, alternatives
and complications were also discussed. The patient understands and
wishes to proceed with the procedure. Written consent was obtained.

Ultrasound was performed to localize and mark an adequate pocket of
fluid in the right chest. The area was then prepped and draped in
the normal sterile fashion. 1% Lidocaine was used for local
anesthesia. Under ultrasound guidance a 6 Fr Safe-T-Centesis
catheter was introduced. Thoracentesis was performed. The catheter
was removed and a dressing applied.
FINDINGS: A total of approximately 1.1 L of hazy gold fluid was removed.
Samples were sent to the laboratory as requested by the clinical
team.
IMPRESSION: Successful ultrasound guided right thoracentesis yielding 1.1 L of
pleural fluid.

## 2022-10-03 IMAGING — DX DG ABD PORTABLE 1V
1 series · 1 of 1 positions shown · non-contrast
Comparison: None.

CLINICAL DATA: Orogastric tube placement.

EXAM:
PORTABLE ABDOMEN - 1 VIEW

[abdomen kub]
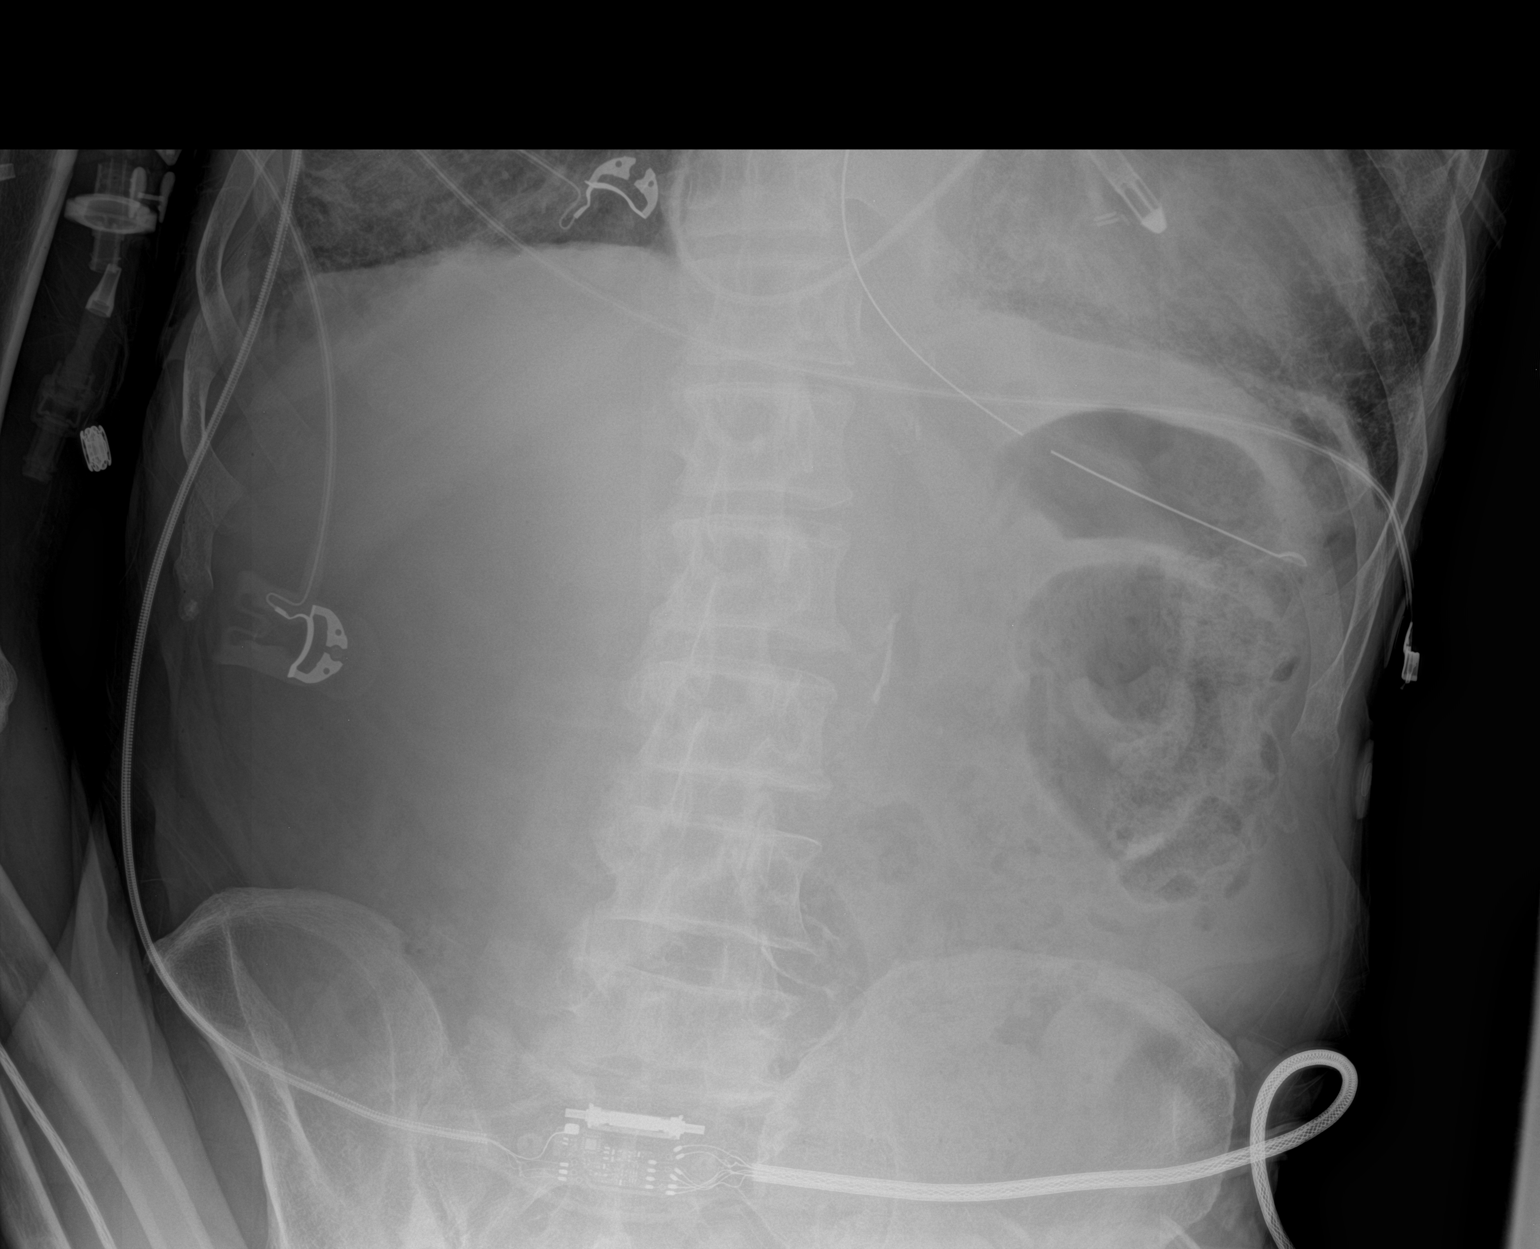

[1 of 1 positions shown; findings below may reference images not displayed]

FINDINGS: An orogastric tube is seen with tip overlying the proximal stomach.
No evidence of dilated bowel loops.
IMPRESSION: Orogastric tube tip overlies the proximal stomach.

## 2022-10-03 IMAGING — DX DG CHEST 1V PORT
1 series · 1 of 1 positions shown · non-contrast
Comparison: Single-view of the chest 04/30/2020.

CLINICAL DATA: Status post central line placement.

EXAM:
PORTABLE CHEST 1 VIEW

[chest ap]
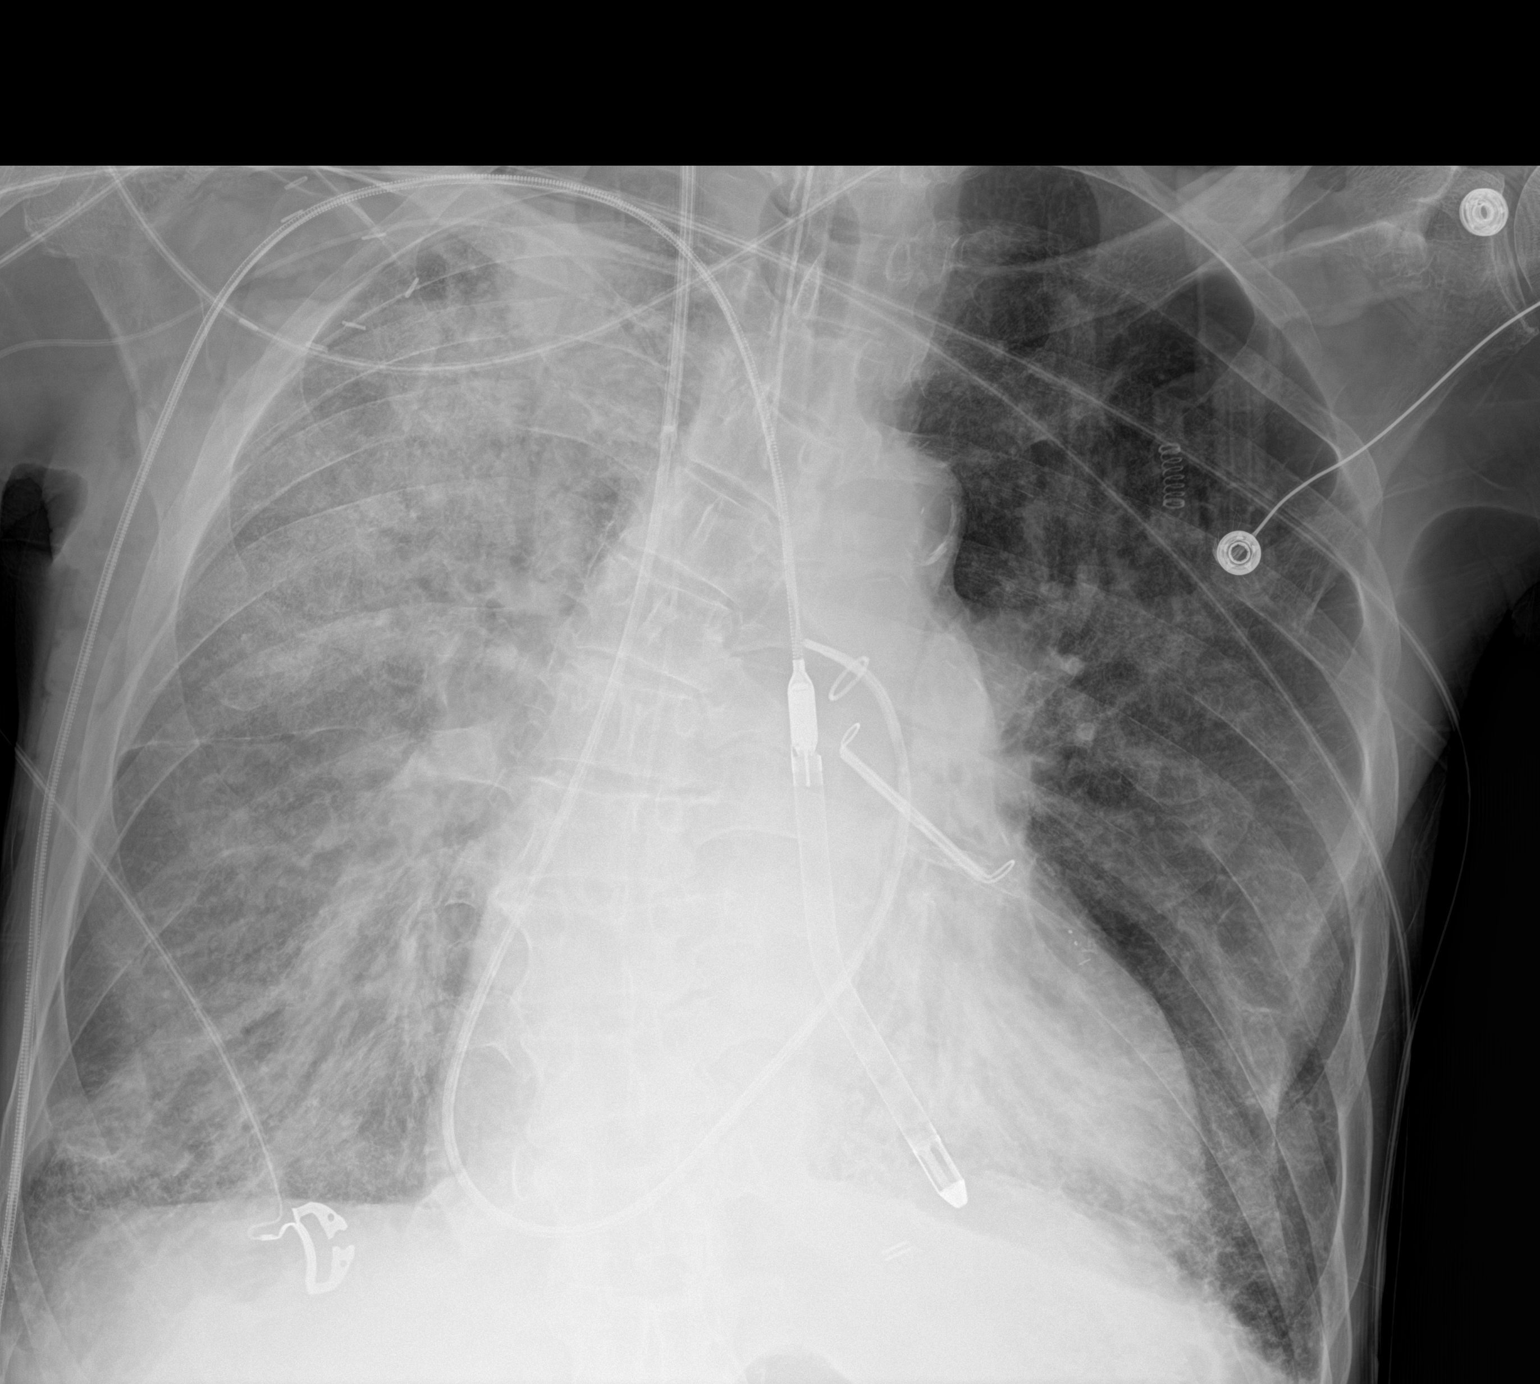

[1 of 1 positions shown; findings below may reference images not displayed]

FINDINGS: Endotracheal tube is in place with the tip in good position just
below the clavicular heads. Impella device projects in good
position. Right IJ approach Swan-Ganz catheter tip is in the
proximal right main pulmonary artery. No pneumothorax.

Airspace disease throughout the right chest is much worse than on
the prior study. Patchy airspace opacity in left lung base is
unchanged. Heart size is normal.
IMPRESSION: Support tubes and lines project in good position.  No pneumothorax.

Marked worsening of airspace disease throughout the right chest most
worrisome for pneumonia. Patchy airspace disease in left lung base
is unchanged.

## 2022-10-03 IMAGING — RF DG CHEST 1V
1 series · 1 of 1 positions shown · non-contrast
Comparison: Chest x-ray 04/30/2020.

CLINICAL DATA: Impella device placement.

EXAM:
CHEST  1 VIEW

[Series 1: run · 1 of 1 slices shown]
[im 1/1]
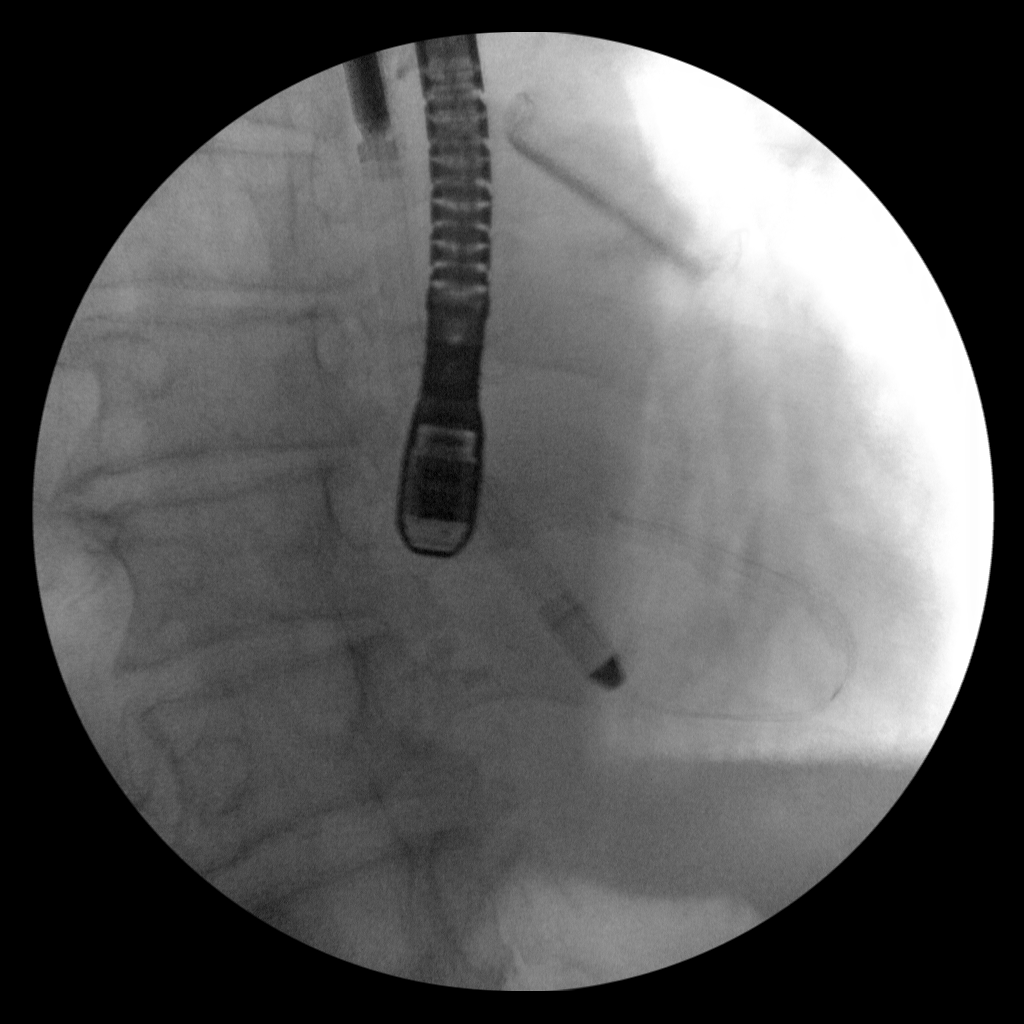

[1 of 1 positions shown; findings below may reference images not displayed]

FINDINGS: Impella device noted over the mid chest. One image obtained. 3
minutes 10 seconds fluoroscopy. 19.0 mGy.
IMPRESSION: Impella device noted over the mid chest.

## 2023-09-03 ENCOUNTER — Encounter: Payer: Self-pay | Admitting: Internal Medicine
